# Patient Record
Sex: Male | Born: 1950 | Race: White | Hispanic: No | Marital: Married | Smoking: Former smoker
Health system: Southern US, Community
[De-identification: ages and names within clinical notes are randomized; demographics above are authoritative.]

## PROBLEM LIST (undated history)

## (undated) DIAGNOSIS — J449 Chronic obstructive pulmonary disease, unspecified: Secondary | ICD-10-CM

## (undated) DIAGNOSIS — J439 Emphysema, unspecified: Secondary | ICD-10-CM

## (undated) DIAGNOSIS — I4891 Unspecified atrial fibrillation: Secondary | ICD-10-CM

## (undated) DIAGNOSIS — K219 Gastro-esophageal reflux disease without esophagitis: Secondary | ICD-10-CM

## (undated) DIAGNOSIS — K529 Noninfective gastroenteritis and colitis, unspecified: Secondary | ICD-10-CM

## (undated) DIAGNOSIS — I10 Essential (primary) hypertension: Secondary | ICD-10-CM

## (undated) DIAGNOSIS — I2510 Atherosclerotic heart disease of native coronary artery without angina pectoris: Secondary | ICD-10-CM

## (undated) DIAGNOSIS — I219 Acute myocardial infarction, unspecified: Secondary | ICD-10-CM

## (undated) DIAGNOSIS — E119 Type 2 diabetes mellitus without complications: Secondary | ICD-10-CM

## (undated) HISTORY — PX: ABDOMINAL HERNIA REPAIR: SHX539

## (undated) HISTORY — PX: KNEE ARTHROSCOPY: SHX127

## (undated) HISTORY — PX: CHOLECYSTECTOMY: SHX55

---
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
## 1997-12-18 DIAGNOSIS — I219 Acute myocardial infarction, unspecified: Secondary | ICD-10-CM

## 1997-12-18 HISTORY — DX: Acute myocardial infarction, unspecified: I21.9

## 2000-12-18 DIAGNOSIS — G43909 Migraine, unspecified, not intractable, without status migrainosus: Secondary | ICD-10-CM | POA: Insufficient documentation

## 2001-12-18 DIAGNOSIS — D126 Benign neoplasm of colon, unspecified: Secondary | ICD-10-CM | POA: Insufficient documentation

## 2004-05-06 ENCOUNTER — Other Ambulatory Visit: Payer: Self-pay

## 2004-11-15 ENCOUNTER — Ambulatory Visit: Payer: Self-pay | Admitting: Family Medicine

## 2004-11-23 ENCOUNTER — Ambulatory Visit: Payer: Self-pay

## 2005-07-25 ENCOUNTER — Inpatient Hospital Stay: Payer: Self-pay | Admitting: Internal Medicine

## 2005-07-25 ENCOUNTER — Other Ambulatory Visit: Payer: Self-pay

## 2006-10-18 ENCOUNTER — Ambulatory Visit: Payer: Self-pay | Admitting: Family Medicine

## 2007-06-17 ENCOUNTER — Other Ambulatory Visit: Payer: Self-pay

## 2007-06-17 ENCOUNTER — Emergency Department: Payer: Self-pay | Admitting: Unknown Physician Specialty

## 2007-10-28 ENCOUNTER — Ambulatory Visit: Payer: Self-pay | Admitting: Cardiology

## 2007-10-28 ENCOUNTER — Other Ambulatory Visit: Payer: Self-pay

## 2008-06-25 ENCOUNTER — Ambulatory Visit: Payer: Self-pay | Admitting: Surgery

## 2008-07-22 ENCOUNTER — Ambulatory Visit: Payer: Self-pay | Admitting: Surgery

## 2008-07-29 ENCOUNTER — Ambulatory Visit: Payer: Self-pay | Admitting: Surgery

## 2009-01-28 ENCOUNTER — Observation Stay: Payer: Self-pay | Admitting: Internal Medicine

## 2009-05-07 ENCOUNTER — Ambulatory Visit: Payer: Self-pay | Admitting: Neurology

## 2009-06-12 ENCOUNTER — Emergency Department: Payer: Self-pay | Admitting: Emergency Medicine

## 2009-11-29 ENCOUNTER — Emergency Department: Payer: Self-pay | Admitting: Emergency Medicine

## 2010-02-04 ENCOUNTER — Emergency Department: Payer: Self-pay | Admitting: Unknown Physician Specialty

## 2010-05-14 ENCOUNTER — Inpatient Hospital Stay: Payer: Self-pay | Admitting: Internal Medicine

## 2010-07-08 ENCOUNTER — Emergency Department: Payer: Self-pay | Admitting: Emergency Medicine

## 2012-02-09 ENCOUNTER — Emergency Department: Payer: Self-pay | Admitting: Emergency Medicine

## 2012-02-09 LAB — COMPREHENSIVE METABOLIC PANEL
Albumin: 3.8 g/dL (ref 3.4–5.0)
Anion Gap: 12 (ref 7–16)
BUN: 10 mg/dL (ref 7–18)
Calcium, Total: 8.9 mg/dL (ref 8.5–10.1)
Chloride: 102 mmol/L (ref 98–107)
Co2: 27 mmol/L (ref 21–32)
Creatinine: 1.05 mg/dL (ref 0.60–1.30)
EGFR (African American): >60
EGFR (Non-African Amer.): >60
Glucose: 103 mg/dL — ABNORMAL HIGH (ref 65–99)
Osmolality: 281 (ref 275–301)
Potassium: 3.6 mmol/L (ref 3.5–5.1)
SGOT(AST): 24 U/L (ref 15–37)
SGPT (ALT): 33 U/L
Sodium: 141 mmol/L (ref 136–145)
Total Protein: 7.2 g/dL (ref 6.4–8.2)

## 2012-02-09 LAB — CBC
HCT: 45.8 % (ref 40.0–52.0)
HGB: 15.2 g/dL (ref 13.0–18.0)
MCH: 30 pg (ref 26.0–34.0)
MCV: 91 fL (ref 80–100)
RBC: 5.06 10*6/uL (ref 4.40–5.90)
WBC: 7.4 10*3/uL (ref 3.8–10.6)

## 2012-02-09 LAB — CK TOTAL AND CKMB (NOT AT ARMC)
CK, Total: 90 U/L (ref 35–232)
CK-MB: 1.1 ng/mL (ref 0.5–3.6)

## 2012-02-09 LAB — PRO B NATRIURETIC PEPTIDE: B-Type Natriuretic Peptide: 212 pg/mL — ABNORMAL HIGH (ref 0–125)

## 2012-02-09 LAB — TROPONIN I: Troponin-I: <0.02 ng/mL

## 2012-02-15 ENCOUNTER — Other Ambulatory Visit: Payer: Self-pay | Admitting: Cardiology

## 2012-02-15 LAB — TROPONIN I: Troponin-I: <0.02 ng/mL

## 2012-02-15 LAB — CK TOTAL AND CKMB (NOT AT ARMC)
CK, Total: 70 U/L (ref 35–232)
CK-MB: 1.9 ng/mL (ref 0.5–3.6)

## 2013-02-04 ENCOUNTER — Observation Stay: Payer: Self-pay | Admitting: Internal Medicine

## 2013-02-04 LAB — COMPREHENSIVE METABOLIC PANEL
Albumin: 3.9 g/dL (ref 3.4–5.0)
Alkaline Phosphatase: 130 U/L (ref 50–136)
Anion Gap: 6 — ABNORMAL LOW (ref 7–16)
Bilirubin,Total: 0.5 mg/dL (ref 0.2–1.0)
Chloride: 104 mmol/L (ref 98–107)
Co2: 28 mmol/L (ref 21–32)
Creatinine: 1.02 mg/dL (ref 0.60–1.30)
EGFR (African American): >60
EGFR (Non-African Amer.): >60
Glucose: 114 mg/dL — ABNORMAL HIGH (ref 65–99)
Osmolality: 275 (ref 275–301)
Potassium: 4 mmol/L (ref 3.5–5.1)
SGOT(AST): 32 U/L (ref 15–37)
Sodium: 138 mmol/L (ref 136–145)

## 2013-02-04 LAB — LIPID PANEL
HDL Cholesterol: 38 mg/dL — ABNORMAL LOW (ref 40–60)
Ldl Cholesterol, Calc: 114 mg/dL — ABNORMAL HIGH (ref 0–100)
Triglycerides: 166 mg/dL (ref 0–200)
VLDL Cholesterol, Calc: 33 mg/dL (ref 5–40)

## 2013-02-04 LAB — URINALYSIS, COMPLETE
Bilirubin,UR: NEGATIVE
Glucose,UR: NEGATIVE mg/dL (ref 0–75)
Ketone: NEGATIVE
Nitrite: NEGATIVE
Ph: 6 (ref 4.5–8.0)
Protein: NEGATIVE
RBC,UR: NONE SEEN /HPF (ref 0–5)
Specific Gravity: 1.004 (ref 1.003–1.030)
Squamous Epithelial: NONE SEEN

## 2013-02-04 LAB — CBC WITH DIFFERENTIAL/PLATELET
Basophil #: 0.1 10*3/uL (ref 0.0–0.1)
HCT: 48 % (ref 40.0–52.0)
HGB: 16 g/dL (ref 13.0–18.0)
Lymphocyte #: 1.4 10*3/uL (ref 1.0–3.6)
Lymphocyte %: 14.7 %
MCHC: 33.3 g/dL (ref 32.0–36.0)
Neutrophil #: 6.9 10*3/uL — ABNORMAL HIGH (ref 1.4–6.5)
Platelet: 228 10*3/uL (ref 150–440)
RDW: 16 % — ABNORMAL HIGH (ref 11.5–14.5)
WBC: 9.2 10*3/uL (ref 3.8–10.6)

## 2013-02-04 LAB — CK TOTAL AND CKMB (NOT AT ARMC): CK, Total: 125 U/L (ref 35–232)

## 2013-02-05 LAB — CK TOTAL AND CKMB (NOT AT ARMC)
CK, Total: 74 U/L (ref 35–232)
CK-MB: 0.9 ng/mL (ref 0.5–3.6)

## 2015-02-16 ENCOUNTER — Encounter: Admit: 2015-02-16 | Disposition: A | Discharge status: Home / Self Care | Payer: Self-pay

## 2015-03-19 ENCOUNTER — Encounter: Admit: 2015-03-19 | Disposition: A | Discharge status: Home / Self Care | Payer: Self-pay

## 2015-04-09 NOTE — Discharge Summary (Signed)
PATIENT NAME:  FERN, ASMAR MR#:  [id number] DATE OF BIRTH:  1951/08/24  DATE OF ADMISSION:  02/04/2013 DATE OF DISCHARGE:  02/05/2013  DISCHARGE DIAGNOSES:  1.  Chest pain secondary to known coronary artery disease.  2.  Acute bronchitis.  3.  Chronic obstructive pulmonary disease.  4.  Hypertension.   DISCHARGE MEDICATIONS: 1.  Combivent 2 puffs 4 times daily.  2.  Symbicort 160/4.5 two puffs twice daily.   3.  Clobetasol propionate apply as needed for psoriasis.  4.  Simethicone 80 mg by mouth 3 times daily.  5.  Omeprazole 20 mg by mouth twice daily.  6.  Aspirin 325 mg 1/2 tablet daily.  7.  Imdur 30 mg by mouth daily.  8.  Metoprolol 50 mg by mouth every 12 hours.   DIET:  Low sodium diet.   FOLLOW-UP:  With the [facility] primary physician in 1 to 2 weeks and also Dr. Saralyn Pilar in 1 to 2 weeks.    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  Dr. Saralyn Pilar called me and said his stress test looks okay and he can follow up with them as an outpatient.  The patient admitted yesterday for chest pain and trouble breathing and diaphoresis.  Symptoms relieved with nitroglycerin paste and placed on observation.  Troponins have been negative x 3.  The patient had a stress test.  Results are discussed as below. Dr. Georgeanna Lea  called and said patient does have defects consistent with old MI, but nothing to suggest that he needs further cardiac work-up,  he is sent home with aspirin, beta-blockers and statins that he can continue.  2.  COPD.  He had no flare.  He could continue Symbicort and Combivent.  The patient had a CT of the chest on admission which did not show any pulmonary emboli and the patient's lipid panel showed  LDL of 114 and VLDL 33, HDL 38 and triglycerides 166, so we are going to call in a prescription for Lipitor 10 mg daily to his pharmacy.   TIME SPENT:  About more than 30 minutes on discharge preparation.     ____________________________ Epifanio Lesches, MD sk:ea D: 02/05/2013 22:26:41 ET T: 02/06/2013 06:13:53 ET JOB#: [id number]  cc: Epifanio Lesches, MD, <Dictator> Dr. Stana Bunting MD ELECTRONICALLY SIGNED 02/18/2013 13:44

## 2015-04-09 NOTE — H&P (Signed)
DATE OF BIRTH:  10/02/1951  DATE OF ADMISSION:  02/04/2013  PRIMARY DOCTOR:  Dr. Lovie Macadamia   EMERGENCY ROOM PHYSICIAN:  Dr. Cinda Quest    CHIEF COMPLAINT:  Chest pain.   HISTORY OF PRESENT ILLNESS:  The patient is a 64 year old male patient with history of COPD, who came in because of chest pain. The patient started to have chest pain since yesterday night, but got worse this morning. Says that heaviness in the chest on the left side associated with some nausea, vomiting, diaphoresis. Pain radiates to the left arm as well. The patient says chest pain relieved with nitroglycerin, and patient noticed to have some sweating and dizziness. Right now, he is chest pain free.   PAST MEDICAL HISTORY:  Significant for history of COPD, hyperlipidemia, hypertension, history of hospitalization in 2011. He had a cardiac cath at that time, which showed 100% stenosis in the mid RCA and he wanted medical management.   ALLERGIES:  CODEINE, ERYTHROMYCIN, HYDROCODONE, PREDNISONE, VICODIN,  ZANTAC.  HOME MEDICATIONS:  Albuterol with ipratropium 2 puffs 4 times daily, aspirin 325 mg daily with 1/2 tablet once a day, clobetasol propionate 0.05% topical daily, hydrocortisone topical 2.5% cream to affected areas to face and groin as needed, Nexium 30 mg p.o. daily, metoprolol 100 mg p.o. b.i.d., omeprazole 20 mg p.o. b.i.d., simethicone 80 mg p.o. t.i.d. as needed for gas, Symbicort 160/4.5  inhalation 2 times daily.   FAMILY HISTORY:  Father died at age of 59 with heart attack, and brother died at the age of around 11s with a heart attack.   SOCIAL HISTORY:  He was a heavy smoker, quit 16 years ago. No alcohol. No drugs.   PAST SURGICAL HISTORY:  None.    REVIEW OF SYSTEMS:  GENERAL:  No fever. No fatigue.  EYES:  No blurred vision.  EARS, NOSE, THROAT:  No tinnitus. No ear pain. No epistaxis. No difficulty swallowing. RESPIRATORY:  Has cough and COPD.   CARDIOVASCULAR: Chest pain, with some dizziness, nausea  and diaphoresis, started yesterday, it is relieved with nitroglycerin.  GASTROINTESTINAL:  No nausea. No vomiting. No abdominal pain.  ENDOCRINE:  No polyuria or nocturia. INTEGUMENT:  No skin rash.  MUSCULOSKELETAL: No joint pain.  NEUROLOGIC: No numbness or weakness.  PSYCHIATRIC: No anxiety or insomnia.   PHYSICAL EXAMINATION:  VITAL SIGNS: Temperature normal, heart rate 68, respirations 22, blood pressure 172/86,  right now he is saturating around 92% on room air. GENERAL:  He is alert, awake, oriented, answering questions appropriately.  HEENT: Head atraumatic, normocephalic. Pupils equally reacting to light. Extraocular movements are intact.  EARS, NOSE, THROAT:  No tympanic membrane congestion. No turbinate hypertrophy. No oropharyngeal erythema.  NECK:  Normal range of motion. No JVD.  No carotid bruit.   CARDIOVASCULAR:  S1, S2 regular. No murmurs.  LUNGS:  Clear to auscultation. No wheezing.  ABDOMEN:  Soft, nontender, nondistended. Bowel sounds present.  EXTREMITIES:  No extremity edema. No cyanosis. No clubbing.  NEUROLOGIC:  Alert, awake, oriented. Cranial nerves II to XII are intact. Power 5/5  upper and lower extremities. Sensory and motor intact.  DTRs 2+ bilaterally.  PSYCHIATRIC:  Oriented to time, place, person.   LAB DATA:  Electrolytes:  Sodium 138, potassium 4, chloride 104, bicarb 28, BUN 8, creatinine 1.02, glucose 114. Troponin less than 0.02. CK total to 125, CPK-MB 1.4. WBC 9.2, hemoglobin 6, hematocrit 48, platelets 228. The patient had a CT of the chest, which showed no evidence of pulmonary emboli. No evidence  of pneumonia. Emphysematous changes present in lungs. There is a 3 mm diameter left lower lobe nodule, not clearly evident on the previous study. He needs surveillance CAT scan in 3 to 4 month interval for a period of 2 years.  No evidence of CHF.  No lymphadenopathy. Chest x-ray did not show any acute changes, chronic emphysematous changes present. The  patient's EKG has T-wave inversions in V2, V3, V4, V5 and V6.  Compared to last EKG that was done in February of last year, T-wave inversions, V2 and V3 are new, but patient did have V4, V5, V6 T-wave inversions before.  ASSESSMENT AND PLAN:  1.  The patient is a 64 year old male with history of chest pain before and also cardiac catheterization in 2011 showing 100% stenosis of the first obtuse  marginal, came in with chest pain associated with some nausea, diaphoresis, and relieved with nitroglycerin. Symptoms concerning for unstable angina. The patient will be admitted for overnight observation. Continue aspirin, beta blocker, statins, nitroglycerin. Also cycle the troponins, obtain a stress test in the morning.   2.  Was slightly bradycardic, so will decrease the metoprolol. He takes metoprolol 100 p.o. b.i.d. Will change it to 50 p.o. b.i.d., and monitor on telemetry.   3. Chronic obstructive pulmonary disease. The patient has no flare at this time. No wheezing. Continue Symbicort and albuterol.  Time spent on history and physical: About 55 minutes.   Discussed the plan with the patient, agreeable to stay overnight.    ____________________________ Epifanio Lesches, MD sk:mr D: 02/04/2013 19:01:46 ET T: 02/04/2013 20:19:27 ET JOB#: [id number]  cc: Epifanio Lesches, MD, <Dictator> Youlanda Roys. Lovie Macadamia, MD   Epifanio Lesches MD ELECTRONICALLY SIGNED 02/23/2013 22:34

## 2015-04-19 ENCOUNTER — Encounter: Payer: Non-veteran care | Attending: General Practice

## 2015-04-19 DIAGNOSIS — R0602 Shortness of breath: Secondary | ICD-10-CM | POA: Insufficient documentation

## 2015-04-21 ENCOUNTER — Encounter: Payer: Non-veteran care | Admitting: *Deleted

## 2015-04-21 DIAGNOSIS — R0602 Shortness of breath: Secondary | ICD-10-CM | POA: Diagnosis not present

## 2015-04-21 DIAGNOSIS — J449 Chronic obstructive pulmonary disease, unspecified: Secondary | ICD-10-CM

## 2015-04-21 NOTE — Progress Notes (Signed)
Daily Session Note  Patient Details  Name: STEED KANAAN MRN: [id number] Date of Birth: August 31, 1951 [facility]  Encounter Date: 04/21/2015  Check In:     Session Check In - 04/21/15 1129    Check-In   Staff Present Carson Myrtle BS,RRT, Respiratory Therapist; Lestine Box BS, ACSM EP-C, Exercise Physiologist; Candiss Norse MS, ACSM CEP, Exercise Physiologist   ER physicians immediately available to respond to emergencies [name] immediately available ER MD   Physician(s) Drs: Katrine Coho, and [name] Balboa.   Warm-up and Cool-down Performed on first and last piece of equipment   VAD Patient? No   Pain Assessment   Currently in Pain? No/denies   Multiple Pain Sites No         Goals Met:  Proper associated with RPD/PD & O2 Sat Independence with exercise equipment Exercise tolerated well  Goals Unmet:  Not Applicable  Goals Comments:    Dr. Emily Filbert is Medical Director for [place] and [name].

## 2015-04-23 ENCOUNTER — Encounter: Payer: Non-veteran care | Admitting: Respiratory Therapy

## 2015-04-23 DIAGNOSIS — J449 Chronic obstructive pulmonary disease, unspecified: Secondary | ICD-10-CM

## 2015-04-23 DIAGNOSIS — R0602 Shortness of breath: Secondary | ICD-10-CM | POA: Diagnosis not present

## 2015-04-23 NOTE — Progress Notes (Signed)
Daily Session Note  Patient Details  Name: Darius Norris MRN: [id number] Date of Birth: 1951/11/20 Referring Provider:  [facility]  Encounter Date: 04/23/2015  Check In:     Session Check In - 04/23/15 1050    Check-In   Staff Present Heath Lark RN, BSN, CCRP;Carroll Enterkin RN, BSN;Ellissa Ayo Blanch Media RRT, RCP Respiratory Therapist;Renee Dillard Essex MS, ACSM CEP Exercise Physiologist   ER physicians immediately available to respond to emergencies [name] immediately available ER MD   Physician(s) Dr. Karma Greaser and Dr. Reita Cliche   Warm-up and Cool-down Performed on first and last piece of equipment   VAD Patient? No   Pain Assessment   Currently in Pain? No/denies           Exercise Prescription Changes - 04/23/15 1000    Recumbant Elliptical   Watts 60      Goals Met:  Proper associated with RPD/PD & O2 Sat Independence with exercise equipment Exercise tolerated well Personal goals reviewed  Goals Unmet:  Not Applicable  Goals Comments:    Dr. Emily Filbert is Medical Director for [place] and [name].

## 2015-04-23 NOTE — Progress Notes (Signed)
Daily Session Note  Patient Details  Name: Darius Norris MRN: [id number] Date of Birth: 1951/10/16 Referring Provider:  [facility]  Encounter Date: 04/23/2015  Check In:     Session Check In - 04/23/15 1050    Check-In   Staff Present Heath Lark RN, BSN, CCRP;Carroll Enterkin RN, BSN;Jr Milliron Blanch Media RRT, RCP Respiratory Therapist;Renee Dillard Essex MS, ACSM CEP Exercise Physiologist   ER physicians immediately available to respond to emergencies [name] immediately available ER MD   Physician(s) Dr. Karma Greaser and Dr. Reita Cliche   Warm-up and Cool-down Performed on first and last piece of equipment   VAD Patient? No   Pain Assessment   Currently in Pain? No/denies           Exercise Prescription Changes - 04/23/15 1000    Recumbant Elliptical   Watts --      Goals Met:  Proper associated with RPD/PD & O2 Sat Independence with exercise equipment Exercise tolerated well  Goals Unmet:  Not Applicable  Goals Comments:    Dr. Emily Filbert is Medical Director for [place] and [name].

## 2015-04-26 ENCOUNTER — Encounter: Payer: Non-veteran care | Admitting: *Deleted

## 2015-04-26 DIAGNOSIS — J449 Chronic obstructive pulmonary disease, unspecified: Secondary | ICD-10-CM

## 2015-04-26 DIAGNOSIS — R0602 Shortness of breath: Secondary | ICD-10-CM | POA: Diagnosis not present

## 2015-04-26 NOTE — Progress Notes (Signed)
Daily Session Note  Patient Details  Name: GRIER VU MRN: [id number] Date of Birth: 1951-02-27 [facility]  Encounter Date: 04/26/2015  Check In:     Session Check In - 04/26/15 1123    Check-In   Staff Present Lestine Box BS, ACSM EP-C, Exercise Physiologist;Lacy Sofia Dillard Essex MS, ACSM CEP Exercise Physiologist;Laureen Janell Quiet, RRT, Respiratory Therapist   ER physicians immediately available to respond to emergencies [name] immediately available ER MD   Physician(s) Schaevit and Jimmye Norman   Warm-up and Cool-down Performed on first and last piece of equipment   VAD Patient? No   Pain Assessment   Currently in Pain? No/denies   Multiple Pain Sites No           Exercise Prescription Changes - 04/26/15 1100    Exercise Review   Progression Yes   NuStep   Level 4   Watts 45   Minutes 15      Goals Met:  Proper associated with RPD/PD & O2 Sat Independence with exercise equipment Exercise tolerated well  Goals Unmet:  Not Applicable  Goals Comments:    Dr. Emily Filbert is Medical Director for [place] and [name].

## 2015-04-28 ENCOUNTER — Encounter: Payer: Non-veteran care | Admitting: *Deleted

## 2015-04-28 DIAGNOSIS — J449 Chronic obstructive pulmonary disease, unspecified: Secondary | ICD-10-CM

## 2015-04-28 DIAGNOSIS — R0602 Shortness of breath: Secondary | ICD-10-CM | POA: Diagnosis not present

## 2015-04-28 NOTE — Progress Notes (Signed)
Daily Session Note  Patient Details  Name: Darius Norris MRN: [id number] Date of Birth: 1951-08-12 [facility]  Encounter Date: 04/28/2015  Check In:     Session Check In - 04/28/15 1142    Check-In   Staff Present Heath Lark RN, BSN, CCRP;Steven Way BS, ACSM EP-C, Exercise Physiologist;Renee Leland MS, ACSM CEP Exercise Physiologist;Laureen [employer], RRT, Respiratory Therapist   ER physicians immediately available to respond to emergencies [name] immediately available ER MD   Physician(s) Drs: Thomasene Lot and Joni Fears   Medication changes reported     No   Fall or balance concerns reported    No   Warm-up and Cool-down Performed on first and last piece of equipment   VAD Patient? No   Pain Assessment   Currently in Pain? No/denies         Goals Met:  Independence with exercise equipment Exercise tolerated well Strength training completed today  Goals Unmet:  Not Applicable  Goals Comments:    Dr. Emily Filbert is Medical Director for [place] and [name].

## 2015-04-30 ENCOUNTER — Encounter: Payer: Non-veteran care | Admitting: *Deleted

## 2015-04-30 DIAGNOSIS — J449 Chronic obstructive pulmonary disease, unspecified: Secondary | ICD-10-CM

## 2015-04-30 DIAGNOSIS — R0602 Shortness of breath: Secondary | ICD-10-CM | POA: Diagnosis not present

## 2015-04-30 NOTE — Progress Notes (Signed)
Daily Session Note  Patient Details  Name: Darius Norris MRN: [id number] Date of Birth: 1951-04-06 Referring Provider:  [facility]  Encounter Date: 04/30/2015  Check In:     Session Check In - 04/30/15 1051    Check-In   Staff Present Heath Lark RN, BSN, CCRP;Braelynn Lupton Bowles MS, ACSM CEP Exercise Physiologist;Stacey Blanch Media RRT, RCP Respiratory Therapist   ER physicians immediately available to respond to emergencies [name] immediately available ER MD   Physician(s) Kerman Passey and Quale   Medication changes reported     No   Fall or balance concerns reported    No   Warm-up and Cool-down Performed on first and last piece of equipment   VAD Patient? No   Pain Assessment   Currently in Pain? No/denies   Multiple Pain Sites No         Goals Met:  Proper associated with RPD/PD & O2 Sat Independence with exercise equipment Using PLB without cueing & demonstrates good technique Exercise tolerated well Strength training completed today  Goals Unmet:  Not Applicable  Goals Comments:   Dr. Emily Filbert is Medical Director for [place] and [name].

## 2015-05-03 ENCOUNTER — Encounter: Payer: Non-veteran care | Admitting: *Deleted

## 2015-05-03 DIAGNOSIS — R0602 Shortness of breath: Secondary | ICD-10-CM | POA: Diagnosis not present

## 2015-05-03 DIAGNOSIS — J449 Chronic obstructive pulmonary disease, unspecified: Secondary | ICD-10-CM

## 2015-05-03 NOTE — Progress Notes (Signed)
Daily Session Note  Patient Details  Name: MARUICE PIERONI MRN: [id number] Date of Birth: [birth year] [facility]  Encounter Date: 05/03/2015  Check In:     Session Check In - 05/03/15 1110    Check-In   Staff Present Laureen Owens Shark BS, RRT, Respiratory Therapist;Steven Way BS, ACSM EP-C, Exercise Physiologist;Renee Dillard Essex MS, ACSM CEP Exercise Physiologist   ER physicians immediately available to respond to emergencies [name] immediately available ER MD   Physician(s) Karma Greaser and Corky Downs   Medication changes reported     No   Fall or balance concerns reported    No   Warm-up and Cool-down Performed on first and last piece of equipment   VAD Patient? No   Pain Assessment   Currently in Pain? No/denies           Exercise Prescription Changes - 05/03/15 1100    Exercise Review   Progression Yes   REL-XR   Level 5   Watts 50      Goals Met:  Proper associated with RPD/PD & O2 Sat Independence with exercise equipment Exercise tolerated well Personal goals reviewed Strength training completed today  Goals Unmet:  Not Applicable  Goals Comments:    Dr. Emily Filbert is Medical Director for [place] and [name].

## 2015-05-05 DIAGNOSIS — J449 Chronic obstructive pulmonary disease, unspecified: Secondary | ICD-10-CM

## 2015-05-05 DIAGNOSIS — R0602 Shortness of breath: Secondary | ICD-10-CM | POA: Diagnosis not present

## 2015-05-05 NOTE — Progress Notes (Signed)
Daily Session Note  Patient Details  Name: DAN SCEARCE MRN: [id number] Date of Birth: 1951/09/06 [facility]  Encounter Date: 05/05/2015  Check In:     Session Check In - 05/05/15 1043    Check-In   Staff Present Laureen Owens Shark BS, RRT, Respiratory Therapist;Renee Dillard Essex MS, ACSM CEP Exercise Physiologist;Emon Lance BS, ACSM EP-C, Exercise Physiologist   ER physicians immediately available to respond to emergencies See telemetry face sheet for immediately available ER MD   Medication changes reported     No   Fall or balance concerns reported    No   Warm-up and Cool-down Performed on first and last piece of equipment   Pain Assessment   Currently in Pain? No/denies         Goals Met:  Proper associated with RPD/PD & O2 Sat Exercise tolerated well No report of cardiac concerns or symptoms Strength training completed today  Goals Unmet:  Not Applicable  Goals Comments:    Dr. Emily Filbert is Medical Director for [place] and [name].

## 2015-05-07 ENCOUNTER — Encounter: Payer: Non-veteran care | Admitting: *Deleted

## 2015-05-07 DIAGNOSIS — R0602 Shortness of breath: Secondary | ICD-10-CM | POA: Diagnosis not present

## 2015-05-07 DIAGNOSIS — J449 Chronic obstructive pulmonary disease, unspecified: Secondary | ICD-10-CM

## 2015-05-07 NOTE — Progress Notes (Signed)
Daily Session Note  Patient Details  Name: Darius Norris MRN: [id number] Date of Birth: February 19, 1951 Referring Provider:  [facility]  Encounter Date: 05/07/2015  Check In:     Session Check In - 05/07/15 1030    Check-In   Staff Present Heath Lark RN, BSN, CCRP;[place] Welcome Mahaffey MS, ACSM CEP Exercise Physiologist;Stacey Blanch Media RRT, RCP Respiratory Therapist   ER physicians immediately available to respond to emergencies [name] immediately available ER MD   Physician(s) Reita Cliche and Corky Downs   Medication changes reported     No   Fall or balance concerns reported    No   Warm-up and Cool-down Performed on first and last piece of equipment   VAD Patient? No   Pain Assessment   Currently in Pain? No/denies   Multiple Pain Sites No         Goals Met:  Proper associated with RPD/PD & O2 Sat Independence with exercise equipment Using PLB without cueing & demonstrates good technique Exercise tolerated well Strength training completed today  Goals Unmet:  Not Applicable  Goals Comments:   Dr. Emily Filbert is Medical Director for [place] and [name].

## 2015-05-10 ENCOUNTER — Encounter: Payer: Non-veteran care | Admitting: *Deleted

## 2015-05-10 DIAGNOSIS — J449 Chronic obstructive pulmonary disease, unspecified: Secondary | ICD-10-CM

## 2015-05-10 DIAGNOSIS — R0602 Shortness of breath: Secondary | ICD-10-CM | POA: Diagnosis not present

## 2015-05-10 NOTE — Progress Notes (Signed)
Daily Session Note  Patient Details  Name: Darius Norris MRN: [id number] Date of Birth: [birth year] [facility]  Encounter Date: 05/10/2015  Check In:     Session Check In - 05/10/15 1312    Check-In   Staff Present Laureen Owens Shark BS, RRT, Respiratory Therapist;Kelly Alfonso Patten, ACSM CEP Exercise Physiologist;Jamie Athas MPH, CHES;Renee Dillard Essex MS, ACSM CEP Exercise Physiologist   ER physicians immediately available to respond to emergencies [name] immediately available ER MD   Physician(s) Corky Downs and Lord   Medication changes reported     No   Fall or balance concerns reported    No   Warm-up and Cool-down Performed on first and last piece of equipment   VAD Patient? No   Pain Assessment   Currently in Pain? No/denies   Multiple Pain Sites No           Exercise Prescription Changes - 05/10/15 1300    Exercise Review   Progression Yes   Response to Exercise   Blood Pressure (Admit) 120/70 mmHg   Blood Pressure (Exercise) 150/78 mmHg   Blood Pressure (Exit) 110/64 mmHg   Heart Rate (Admit) 82 bpm   Heart Rate (Exercise) 90 bpm   Heart Rate (Exit) 64 bpm   Oxygen Saturation (Admit) 90 %   Oxygen Saturation (Exercise) 96 %   Oxygen Saturation (Exit) 97 %   Rating of Perceived Exertion (Exercise) 12   Perceived Dyspnea (Exercise) 3   Resistance Training   Training Prescription Yes   Weight 3   Reps 10-12   Treadmill   MPH 1.4   Grade 0   Minutes 15   NuStep   Level 4   Watts 65   Minutes 15   REL-XR   Level 5   Watts 50   Minutes 15      Goals Met:  Proper associated with RPD/PD & O2 Sat Independence with exercise equipment Exercise tolerated well Queuing for purse lip breathing Strength training completed today  Goals Unmet:  Not Applicable  Goals Comments: Darius Norris plans to continue exercise at the [facility] center or is considering Financial [employer].   Dr. Emily Filbert is Medical Director for [place] and [name].

## 2015-05-10 NOTE — Progress Notes (Signed)
Daily Session Note  Patient Details  Name: Darius Norris MRN: [id number] Date of Birth: 11/13/1951 [facility]  Encounter Date: 05/10/2015  Check In:     Session Check In - 05/10/15 1312    Check-In   Staff Present Laureen Owens Shark BS, RRT, Respiratory Therapist;Sally-Ann Cutbirth Alfonso Patten, ACSM CEP Exercise Physiologist;Jamie Athas MPH, CHES;Renee Dillard Essex MS, ACSM CEP Exercise Physiologist   ER physicians immediately available to respond to emergencies [name] immediately available ER MD   Physician(s) Corky Downs and Lord   Medication changes reported     No   Fall or balance concerns reported    No   Warm-up and Cool-down Performed on first and last piece of equipment   VAD Patient? No   Pain Assessment   Currently in Pain? No/denies   Multiple Pain Sites No           Exercise Prescription Changes - 05/10/15 1300    Exercise Review   Progression Yes   Response to Exercise   Blood Pressure (Admit) 120/70 mmHg   Blood Pressure (Exercise) 150/78 mmHg   Blood Pressure (Exit) 110/64 mmHg   Heart Rate (Admit) 82 bpm   Heart Rate (Exercise) 90 bpm   Heart Rate (Exit) 64 bpm   Oxygen Saturation (Admit) 90 %   Oxygen Saturation (Exercise) 96 %   Oxygen Saturation (Exit) 97 %   Rating of Perceived Exertion (Exercise) 12   Perceived Dyspnea (Exercise) 3   Resistance Training   Training Prescription Yes   Weight 3   Reps 10-12   Treadmill   MPH 1.4   Grade 0   Minutes 15   NuStep   Level 4   Watts 65   Minutes 15   REL-XR   Level 5   Watts 50   Minutes 15      Goals Met:  Proper associated with RPD/PD & O2 Sat Independence with exercise equipment Exercise tolerated well Strength training completed today  Goals Unmet:  Not Applicable  Goals Comments:    Dr. Emily Filbert is Medical Director for [place] and [name].

## 2015-05-10 NOTE — Progress Notes (Signed)
Pulmonary Individual Treatment Plan  Patient Details  Name: Darius Norris MRN: [id number] Date of Birth: [birth year] Referring Provider:  [facility]  Initial Encounter Date:    Visit Diagnosis: Chronic obstructive pulmonary disease, unspecified COPD, unspecified chronic bronchitis type  Patient's Home Medications on Admission: No current outpatient prescriptions on file.  Past Medical History: No past medical history on file.  Tobacco Use: History  Smoking status  . Not on file  Smokeless tobacco  . Not on file    Labs: Recent Review Flowsheet Data    There is no flowsheet data to display.       ADL UCSD:    Pulmonary Function Assessment:   Exercise Target Goals:    Exercise Program Goal: Individual exercise prescription set with THRR, safety & activity barriers. Participant demonstrates ability to understand and report RPE using BORG scale, to self-measure pulse accurately, and to acknowledge the importance of the exercise prescription.  Exercise Prescription Goal: Starting with aerobic activity 30 plus minutes a day, 3 days per week for initial exercise prescription. Provide home exercise prescription and guidelines that participant acknowledges understanding prior to discharge.  Activity Barriers & Risk Stratification:   6 Minute Walk:   Initial Exercise Prescription:   Exercise Prescription Changes:     Exercise Prescription Changes      04/23/15 1000 04/26/15 1100 05/03/15 1100 05/05/15 1000 05/10/15 1300   Exercise Review   Progression  Yes Yes Yes Yes   Response to Exercise   Blood Pressure (Admit)     120/70 mmHg   Blood Pressure (Exercise)     150/78 mmHg   Blood Pressure (Exit)     110/64 mmHg   Heart Rate (Admit)     82 bpm   Heart Rate (Exercise)     90 bpm   Heart Rate (Exit)     64 bpm   Oxygen Saturation (Admit)     90 %   Oxygen Saturation (Exercise)     96 %   Oxygen Saturation (Exit)     97 %   Rating of Perceived  Exertion (Exercise)     12   Perceived Dyspnea (Exercise)     3   Resistance Training   Training Prescription     Yes   Weight     3   Reps     10-12   Treadmill   MPH     1.4   Grade     0   Minutes     15   NuStep   Level  4   4   Watts  45   65   Minutes  15  15 15    Recumbant Elliptical   Watts --       REL-XR   Level   5  5   Watts   50  50   Minutes     15      Discharge Exercise Prescription:    Nutrition:  Target Goals: Understanding of nutrition guidelines, daily intake of sodium 1500mg , cholesterol 200mg , calories 30% from fat and 7% or less from saturated fats, daily to have 5 or more servings of fruits and vegetables.  Biometrics:    Nutrition Therapy Plan and Nutrition Goals:   Nutrition Discharge: Rate Your Plate Scores:   Psychosocial: Target Goals: Acknowledge presence or absence of depression, maximize coping skills, provide positive support system. Participant is able to verbalize types and ability to use techniques and skills needed for  reducing stress and depression.  Initial Review & Psychosocial Screening:   Quality of Life Scores:   PHQ-9:     Recent Review Flowsheet Data    There is no flowsheet data to display.      Psychosocial Evaluation and Intervention:   Psychosocial Re-Evaluation:     Psychosocial Re-Evaluation      04/21/15 1108           Psychosocial Re-Evaluation   Comments Follow up with Darius Norris today reporting progress since coming into this program with less shortness of breath and his ability to do more of his normal activities now.  He has not seen his doctor yet about a sleep study or his panic symptoms since his next scheduled appointment is in July.   He reports ongoing issues with sleep and panic but his sugar levels have regulated better as a result of eating smaller meals more often.  Darius Norris plans to begin working out at the [facility] locally upon completiong of this program in order to  maintain his progress.       Continued Psychosocial Services Needed Yes  Darius Norris will benefit from a sleep study to address his interrupted sleep and also a med eval to address ongoing panic symptoms.         Education: Education Goals: Education classes will be provided on a weekly basis, covering required topics. Participant will state understanding/return demonstration of topics presented.  Learning Barriers/Preferences:   Education Topics: Initial Evaluation Education: - Verbal, written and demonstration of respiratory meds, RPE/PD scales, oximetry and breathing techniques. Instruction on use of nebulizers and MDIs: cleaning and proper use, rinsing mouth with steroid doses and importance of monitoring MDI activations.   General Nutrition Guidelines/Fats and Fiber: -Group instruction provided by verbal, written material, models and posters to present the general guidelines for heart healthy nutrition. Gives an explanation and review of dietary fats and fiber.   Controlling Sodium/Reading Food Labels: -Group verbal and written material supporting the discussion of sodium use in heart healthy nutrition. Review and explanation with models, verbal and written materials for utilization of the food label.   Exercise Physiology & Risk Factors: - Group verbal and written instruction with models to review the exercise physiology of the cardiovascular system and associated critical values. Details cardiovascular disease risk factors and the goals associated with each risk factor.   Aerobic Exercise & Resistance Training: - Gives group verbal and written discussion on the health impact of inactivity. On the components of aerobic and resistive training programs and the benefits of this training and how to safely progress through these programs.   Flexibility, Balance, General Exercise Guidelines: - Provides group verbal and written instruction on the benefits of flexibility and balance training  programs. Provides general exercise guidelines with specific guidelines to those with heart or lung disease. Demonstration and skill practice provided.   Stress Management: - Provides group verbal and written instruction about the health risks of elevated stress, cause of high stress, and healthy ways to reduce stress.   Depression: - Provides group verbal and written instruction on the correlation between heart/lung disease and depressed mood, treatment options, and the stigmas associated with seeking treatment.   Exercise & Equipment Safety: - Individual verbal instruction and demonstration of equipment use and safety with use of the equipment.   Infection Prevention: - Provides verbal and written material to individual with discussion of infection control including proper hand washing and proper equipment cleaning during exercise session.  Falls Prevention: - Provides verbal and written material to individual with discussion of falls prevention and safety.   Diabetes: - Individual verbal and written instruction to review signs/symptoms of diabetes, desired ranges of glucose level fasting, after meals and with exercise. Advice that pre and post exercise glucose checks will be done for 3 sessions at entry of program.   Chronic Lung Diseases: - Group verbal and written instruction to review new updates, new respiratory medications, new advancements in procedures and treatments. Provide informative websites and "800" numbers of self-education.   Lung Procedures: - Group verbal and written instruction to describe testing methods done to diagnose lung disease. Review the outcome of test results. Describe the treatment choices: Pulmonary Function Tests, ABGs and oximetry.   Energy Conservation: - Provide group verbal and written instruction for methods to conserve energy, plan and organize activities. Instruct on pacing techniques, use of adaptive equipment and posture/positioning to  relieve shortness of breath.   Triggers: - Group verbal and written instruction to review types of environmental controls: home humidity, furnaces, filters, dust mite/pet prevention, HEPA vacuums. To discuss weather changes, air quality and the benefits of nasal washing.   Exacerbations: - Group verbal and written instruction to provide: warning signs, infection symptoms, calling MD promptly, preventive modes, and value of vaccinations. Review: effective airway clearance, coughing and/or vibration techniques. Create an Sports administrator.   Oxygen: - Individual and group verbal and written instruction on oxygen therapy. Includes supplement oxygen, available portable oxygen systems, continuous and intermittent flow rates, oxygen safety, concentrators, and Medicare reimbursement for oxygen.   Respiratory Medications: - Group verbal and written instruction to review medications for lung disease. Drug class, frequency, complications, importance of spacers, rinsing mouth after steroid MDI's, and proper cleaning methods for nebulizers.   AED/CPR: - Group verbal and written instruction with the use of models to demonstrate the basic use of the AED with the basic ABC's of resuscitation.   Breathing Retraining: - Provides individuals verbal and written instruction on purpose, frequency, and proper technique of diaphragmatic breathing and pursed-lipped breathing. Applies individual practice skills.   Anatomy and Physiology of the Lungs: - Group verbal and written instruction with the use of models to provide basic lung anatomy and physiology related to function, structure and complications of lung disease.   Heart Failure: - Group verbal and written instruction on the basics of heart failure: signs/symptoms, treatments, explanation of ejection fraction, enlarged heart and cardiomyopathy.   Sleep Apnea: - Individual verbal and written instruction to review Obstructive Sleep Apnea. Review of risk  factors, methods for diagnosing and types of masks and machines for OSA.   Anxiety: - Provides group, verbal and written instruction on the correlation between heart/lung disease and anxiety, treatment options, and management of anxiety.   Relaxation: - Provides group, verbal and written instruction about the benefits of relaxation for patients with heart/lung disease. Also provides patients with examples of relaxation techniques.   Knowledge Questionnaire Score:   Personal Goals and Risk Factors at Admission:   Personal Goals and Risk Factors Review:    Personal Goals Discharge:    Comments: 30 day review

## 2015-05-12 DIAGNOSIS — R0602 Shortness of breath: Secondary | ICD-10-CM | POA: Diagnosis not present

## 2015-05-12 DIAGNOSIS — J449 Chronic obstructive pulmonary disease, unspecified: Secondary | ICD-10-CM

## 2015-05-12 NOTE — Progress Notes (Signed)
Daily Session Note  Patient Details  Name: Darius Norris MRN: [id number] Date of Birth: [birth year] Referring Provider:  [facility]  Encounter Date: 05/12/2015  Check In:     Session Check In - 05/12/15 1041    Check-In   Staff Present Gerlene Burdock RN, Drusilla Kanner MS, ACSM CEP Exercise Physiologist;Jaclynn Laumann BS, ACSM EP-C, Exercise Physiologist   ER physicians immediately available to respond to emergencies [name] immediately available ER MD   Medication changes reported     No   Fall or balance concerns reported    No   Warm-up and Cool-down Performed on first and last piece of equipment   VAD Patient? No   Pain Assessment   Currently in Pain? No/denies         Goals Met:  Proper associated with RPD/PD & O2 Sat Exercise tolerated well No report of cardiac concerns or symptoms Strength training completed today  Goals Unmet:  Not Applicable  Goals Comments:   Dr. Emily Filbert is Medical Director for [place] and [name].

## 2015-05-12 NOTE — Patient Instructions (Signed)
pul Patient Instructions  Patient Details  Name: Darius Norris MRN: [id number] Date of Birth: [birth year] Referring Provider:  [place]  Below are the personal goals you chose as well as exercise and nutrition goals. Our goal is to help you keep on track towards obtaining and maintaining your goals. We will be discussing your progress on these goals with you throughout the program.  Initial Exercise Prescription:   Exercise Goals: Frequency: Be able to perform aerobic exercise three times per week working toward 3-5 days per week.  Intensity: Work with a perceived exertion of 11 (fairly light) - 15 (hard) as tolerated. Follow your new exercise prescription and watch for changes in prescription as you progress with the program. Changes will be reviewed with you when they are made.  Duration: You should be able to do 30 minutes of continuous aerobic exercise in addition to a 5 minute warm-up and a 5 minute cool-down routine.  Nutrition Goals: Your personal nutrition goals will be established when you do your nutrition analysis with the dietician.  The following are nutrition guidelines to follow: Cholesterol < 200mg /day Sodium < 1500mg /day Fiber: Men over 50 yrs - 30 grams per day  Personal Goals:     Personal Goals and Risk Factors at Admission - 11/24/14 0900    Personal Goals and Risk Factors on Admission    Weight Management Yes   Intervention Learn and follow the exercise and diet guidelines while in the program. Utilize the nutrition and education classes to help gain knowledge of the diet and exercise expectations in the program   Admit Weight 285 lb (129.275 kg)   Goal Weight 260 lb (117.935 kg)   Increase Aerobic Exercise and Physical Activity Yes   Intervention While in program, learn and follow the exercise prescription taught. Start at a low level workload and increase workload after able to maintain previous level for 30 minutes. Increase time before  increasing intensity.   Understand more about Heart/Pulmonary Disease. Yes   Intervention While in program utilize professionals for any questions, and attend the education sessions. Great websites to use are www.americanheart.org or www.lung.org for reliable information.   Improve shortness of breath with ADL's Yes   Intervention While in program, learn and follow the exercise prescription taught. Start at a low level workload and increase workload ad advised by the exercise physiologist. Increase time before increasing intensity.   Develop more efficient breathing techniques such as purse lipped breathing and diaphragmatic breathing; and practicing self-pacing with activity Yes   Intervention While in program, learn and utilize the specific breathing techniques taught to you. Continue to practice and use the techniques as needed.   Increase knowledge of respiratory medications and ability to use respiratory devices properly.  Yes   Intervention While in program learn and demonstrate appropriate use of your oxygen therapy by increasing flow with exertion, manage oxygen tank operation, including continuous and intermittent flow.  Understanding oxygen is a drug ordered by your physician.;While in program, learn to administer MDI, nebulizer, and spacer properly.;Learn to take respiratory medicine as ordered.;While in program, learn to Clean MDI, nebulizers, and spacers properly.   Hypertension Yes   Goal Participant will see blood pressure controlled within the values of 140/28mm/Hg or within value directed by their physician.   Intervention Provide nutrition & aerobic exercise along with prescribed medications to achieve BP 140/90 or less.   Lipids Yes   Goal Cholesterol controlled with medications as prescribed, with individualized exercise RX and with  personalized nutrition plan. Value goals: LDL < 70mg , HDL > 40mg . Participant states understanding of desired cholesterol values and following  prescriptions.   Intervention Provide nutrition & aerobic exercise along with prescribed medications to achieve LDL 70mg , HDL >40mg .      Tobacco Use Initial Evaluation: History  Smoking status   Not on file  Smokeless tobacco   Not on file    Copy of goals given to participant.

## 2015-05-14 ENCOUNTER — Telehealth: Payer: Self-pay | Admitting: *Deleted

## 2015-05-14 NOTE — Telephone Encounter (Signed)
NOt able to attend today.

## 2015-05-14 NOTE — Telephone Encounter (Signed)
Wife left vm he would be unable to attend [facility] today.

## 2015-05-19 ENCOUNTER — Encounter: Payer: Medicare Other | Attending: Internal Medicine | Admitting: Respiratory Therapy

## 2015-05-19 VITALS — Ht 66.0 in | Wt 206.0 lb

## 2015-05-19 DIAGNOSIS — J449 Chronic obstructive pulmonary disease, unspecified: Secondary | ICD-10-CM

## 2015-05-19 DIAGNOSIS — R0602 Shortness of breath: Secondary | ICD-10-CM | POA: Insufficient documentation

## 2015-05-19 NOTE — Progress Notes (Signed)
Daily Session Note  Patient Details  Name: Darius Norris MRN: [id number] Date of Birth: April 07, 1951 Referring Provider:  [facility]  Encounter Date: 05/19/2015  Check In:      Goals Met:  Updating Patient Instruction  Goals Unmet:  Not Applicable  Goals Comments:   Dr. Emily Filbert is Medical Director for [place] and [name].

## 2015-05-19 NOTE — Patient Instructions (Signed)
pul Patient Instructions  Patient Details  Name: Darius Norris MRN: [id number] Date of Birth: 08/22/1951 Referring Provider:  [place]  Below are the personal goals you chose as well as exercise and nutrition goals. Our goal is to help you keep on track towards obtaining and maintaining your goals. We will be discussing your progress on these goals with you throughout the program.  Initial Exercise Prescription:   Exercise Goals: Frequency: Be able to perform aerobic exercise three times per week working toward 3-5 days per week.  Intensity: Work with a perceived exertion of 11 (fairly light) - 15 (hard) as tolerated. Follow your new exercise prescription and watch for changes in prescription as you progress with the program. Changes will be reviewed with you when they are made.  Duration: You should be able to do 30 minutes of continuous aerobic exercise in addition to a 5 minute warm-up and a 5 minute cool-down routine.  Nutrition Goals: Your personal nutrition goals will be established when you do your nutrition analysis with the dietician.  The following are nutrition guidelines to follow: Cholesterol < 200mg /day Sodium < 1500mg /day Fiber: Men over 50 yrs - 30 grams per day  Personal Goals:     Personal Goals and Risk Factors at Admission - 11/24/14 0900    Personal Goals and Risk Factors on Admission    Weight Management Yes   Intervention Learn and follow the exercise and diet guidelines while in the program. Utilize the nutrition and education classes to help gain knowledge of the diet and exercise expectations in the program   Admit Weight 285 lb (129.275 kg)   Goal Weight 260 lb (117.935 kg)   Increase Aerobic Exercise and Physical Activity Yes   Intervention While in program, learn and follow the exercise prescription taught. Start at a low level workload and increase workload after able to maintain previous level for 30 minutes. Increase time before  increasing intensity.   Understand more about Heart/Pulmonary Disease. Yes   Intervention While in program utilize professionals for any questions, and attend the education sessions. Great websites to use are www.americanheart.org or www.lung.org for reliable information.   Improve shortness of breath with ADL's Yes   Intervention While in program, learn and follow the exercise prescription taught. Start at a low level workload and increase workload ad advised by the exercise physiologist. Increase time before increasing intensity.   Develop more efficient breathing techniques such as purse lipped breathing and diaphragmatic breathing; and practicing self-pacing with activity Yes   Intervention While in program, learn and utilize the specific breathing techniques taught to you. Continue to practice and use the techniques as needed.   Increase knowledge of respiratory medications and ability to use respiratory devices properly.  Yes   Intervention While in program learn and demonstrate appropriate use of your oxygen therapy by increasing flow with exertion, manage oxygen tank operation, including continuous and intermittent flow.  Understanding oxygen is a drug ordered by your physician.;While in program, learn to administer MDI, nebulizer, and spacer properly.;Learn to take respiratory medicine as ordered.;While in program, learn to Clean MDI, nebulizers, and spacers properly.   Hypertension Yes   Goal Participant will see blood pressure controlled within the values of 140/51mm/Hg or within value directed by their physician.   Intervention Provide nutrition & aerobic exercise along with prescribed medications to achieve BP 140/90 or less.   Lipids Yes   Goal Cholesterol controlled with medications as prescribed, with individualized exercise RX and with  personalized nutrition plan. Value goals: LDL < 70mg , HDL > 40mg . Participant states understanding of desired cholesterol values and following  prescriptions.   Intervention Provide nutrition & aerobic exercise along with prescribed medications to achieve LDL 70mg , HDL >40mg .      Tobacco Use Initial Evaluation: History  Smoking status   Not on file  Smokeless tobacco   Not on file    Copy of goals given to participant.

## 2015-05-19 NOTE — Telephone Encounter (Signed)
Left vm unable to attend today.

## 2015-05-21 ENCOUNTER — Encounter: Payer: Medicare Other | Admitting: *Deleted

## 2015-05-21 DIAGNOSIS — J449 Chronic obstructive pulmonary disease, unspecified: Secondary | ICD-10-CM

## 2015-05-21 DIAGNOSIS — R0602 Shortness of breath: Secondary | ICD-10-CM | POA: Diagnosis not present

## 2015-05-21 NOTE — Progress Notes (Signed)
Daily Session Note  Patient Details  Name: Darius Norris MRN: [id number] Date of Birth: 06/05/1951 Referring Provider:  [facility]  Encounter Date: 05/21/2015  Check In:     Session Check In - 05/21/15 1055    Check-In   Staff Present Laureen Owens Shark BS, RRT, Respiratory Therapist;Joeanthony Seeling RN, BSN;Renee Dillard [place] MS, ACSM CEP Exercise Physiologist;Susanne Bice RN, BSN, CCRP   ER physicians immediately available to respond to emergencies [name] immediately available ER MD   Physician(s) Dr. DR. [name] Balboa, Dr. Reita Cliche   Medication changes reported     No   Fall or balance concerns reported    No   Warm-up and Cool-down Performed on first and last piece of equipment   VAD Patient? No   Pain Assessment   Currently in Pain? No/denies         Goals Met:  Proper associated with RPD/PD & O2 Sat Exercise tolerated well  Goals Unmet:  Not Applicable  Goals Comments:    Dr. Emily Filbert is Medical Director for [place] and [name].

## 2015-05-24 ENCOUNTER — Encounter: Payer: Medicare Other | Admitting: *Deleted

## 2015-05-24 DIAGNOSIS — J449 Chronic obstructive pulmonary disease, unspecified: Secondary | ICD-10-CM

## 2015-05-24 DIAGNOSIS — R0602 Shortness of breath: Secondary | ICD-10-CM | POA: Diagnosis not present

## 2015-05-24 NOTE — Patient Instructions (Signed)
pul Discharge Instructions  Patient Details  Name: Darius Norris MRN: [id number] Date of Birth: Jul 02, 1951 Referring Provider:  [place]   Number of Visits: 36 Reason for Discharge:  Patient reached a stable level of exercise. Patient independent in their exercise.  Smoking History:  History  Smoking status   Not on file  Smokeless tobacco   Not on file    Diagnosis:  Chronic obstructive pulmonary disease, unspecified COPD, unspecified chronic bronchitis type  Initial Exercise Prescription:   Discharge Exercise Prescription:   Functional Capacity:     6 Minute Walk      04/05/15 1018 05/19/15 1044     6 Minute Walk   Phase Mid Program Discharge    Distance 755 feet 760 feet    Walk Time 6 minutes 6 minutes    Resting HR 102 bpm 67 bpm    Resting BP 122/74 mmHg 128/78 mmHg    Max Ex. HR 79 bpm 77 bpm    Max Ex. BP 150/74 mmHg 146/74 mmHg    RPE 15 17    Perceived Dyspnea  4 6    Symptoms  No       Quality of Life:   Personal Goals: Goals established at orientation with interventions provided to work toward goal.    Personal Goals Discharge:   Nutrition & Weight - Outcomes:      Post Biometrics - 05/19/15 1046     Post  Biometrics   Height 5\' 6"  (1.676 m)   Weight 206 lb (93.441 kg)   Waist Circumference 46 inches   Hip Circumference 44.5 inches   Waist to Hip Ratio 1.03 %   BMI (Calculated) 33.3      Nutrition:   Nutrition Discharge:   Education Questionnaire Score:   GD04 Depression Questionnaire Results      Pre 16    Post 10   SF36                                                                   Pre           Post             Physical Fx     13         14                Role Fx: Physical    4    5              Bodily Pain     5    6    General Health    10   10   Vitality     11   12    Social Fx     4   6   Role Fx: Emotional    4   4   Mental Health    19   21    Goals reviewed with  patient; copy given to patient.

## 2015-05-24 NOTE — Progress Notes (Signed)
Daily Session Note  Patient Details  Name: TYMEIR WEATHINGTON MRN: [id number] Date of Birth: [birth year] Referring Provider:  [facility]  Encounter Date: 05/24/2015  Check In:     Session Check In - 05/24/15 1106    Check-In   Staff Present Laureen Owens Shark BS, RRT, Respiratory Therapist;Renee Dillard Essex MS, ACSM CEP Exercise Physiologist;Garin Mata Alfonso Patten, ACSM CEP Exercise Physiologist;Steven Way BS, ACSM EP-C, Exercise Physiologist   ER physicians immediately available to respond to emergencies [name] immediately available ER MD   Physician(s) Jacqualine Code and Kinner   Medication changes reported     No   Fall or balance concerns reported    No   Warm-up and Cool-down Performed on first and last piece of equipment   VAD Patient? No   Pain Assessment   Currently in Pain? No/denies   Multiple Pain Sites No         Goals Met:  Proper associated with RPD/PD & O2 Sat Independence with exercise equipment Exercise tolerated well Strength training completed today  Goals Unmet:  Not Applicable  Goals Comments:    Dr. Emily Filbert is Medical Director for [place] and [name].

## 2015-05-24 NOTE — Patient Instructions (Addendum)
pul Discharge Instructions  Patient Details  Name: Darius Norris MRN: [id number] Date of Birth: October 14, 1951 Referring Provider:  [place]   Number of Visits: 45  Reason for Discharge:  Patient reached a stable level of exercise. Patient independent in their exercise.  Smoking History:  History  Smoking status   Not on file  Smokeless tobacco   Not on file    Diagnosis:  COPD, moderate  Initial Exercise Prescription:   Discharge Exercise Prescription:     Discharge Prescription - 05/24/15 1600    Exercise Review   Progression Yes   Response to Exercise   Blood Pressure (Admit) 128/73 mmHg   Blood Pressure (Exercise) 140/80 mmHg   Blood Pressure (Exit) 116/72 mmHg   Heart Rate (Admit) 69 bpm   Heart Rate (Exercise) 87 bpm   Heart Rate (Exit) 69 bpm   Oxygen Saturation (Admit) 95 %   Oxygen Saturation (Exercise) 94 %   Oxygen Saturation (Exit) 98 %   Rating of Perceived Exertion (Exercise) 13   Perceived Dyspnea (Exercise) 3   Frequency Add 3 additional days to program exercise sessions.   Duration Progress to 50 minutes of aerobic without signs/symptoms of physical distress   Intensity Rest + 30   Progression Continue progressive overload as per policy without signs/symptoms or physical distress.   Resistance Training   Training Prescription Yes   Weight 3   Reps 10-12   Treadmill   MPH 1.4   Grade 1   Minutes 15   NuStep   Level 4   Watts 65   Minutes 15   REL-XR   Level 5   Watts 50   Minutes 15   Home Exercise Plan   Plans to continue exercise at [employer] (comment)  [facility]      Functional Capacity:     6 Minute Walk      04/05/15 1018 05/19/15 1044     6 Minute Walk   Phase Mid Program Discharge    Distance 755 feet 760 feet    Walk Time 6 minutes 6 minutes    Resting HR 102 bpm 67 bpm    Resting BP 122/74 mmHg 128/78 mmHg    Max Ex. HR 79 bpm 77 bpm    Max Ex. BP 150/74 mmHg 146/74 mmHg     RPE 15 17    Perceived Dyspnea  4 6    Symptoms  No       Quality of Life:   Personal Goals: Goals established at orientation with interventions provided to work toward goal.    Personal Goals Discharge:     Personal Goals at Discharge - 05/24/15 1555    Weight Management   Goals Progress/Improvement seen No   Comments Mr Chuba has a weight goal of 150lbs, but he did not lose any weight in the program, last weight 206.3. His wife works second shift, so he has sandwiches at night. He preferred not to meet with the dietitian.   Increase Aerobic Exercise and Physical Activity   Goals Progress/Improvement seen  Yes   Comments Mr Vecchio improved his exercise goals on 3 pieces of equipment and increased his exercise time from88mins to 29mins.   Understand more about Heart/Pulmonary Disease   Goals Progress/Improvement seen  Yes   Comments Mr Levesque attended all the educational class and states he has learned about COPD and self-management of his disease.   Improve shortness of breath with ADL's   Comments  Mr Zelenak's UCSD SOB questionnaire indicated some improvement in shortness of breath. He has learned to PLB and pace himself to help with his breathing.   Breathing Techniques   Goals Progress/Improvement seen  Yes   Comments Using PLB with his exercise and with ADL's.   Increase knowledge of respiratory medications   Goals Progress/Improvement seen  Yes   Comments Mr Weissinger takes his Albuterol MDI and Symbicort MDI correctly and uses a spacer; Uses his SVN as needed and properly cleans the nebulizer; Mr Jiminez has a good understanding of his oxygen and the equipment and uses properly.      Nutrition & Weight - Outcomes:      Post Biometrics - 05/19/15 1046     Post  Biometrics   Height 5\' 6"  (1.676 m)   Weight 206 lb (93.441 kg)   Waist Circumference 46 inches   Hip Circumference 44.5 inches   Waist to Hip Ratio 1.03 %   BMI (Calculated) 33.3       Nutrition:   Nutrition Discharge:   Education Questionnaire Score:   GD04 Depression Questionnaire Results      Pre 16    Post 10   SF36                                                                   Pre           Post             Physical Fx     14         19                Role Fx: Physical     5   5              Bodily Pain     8   8    General Health    12   8   Vitality     13   9    Social Fx     6   6   Role Fx: Emotional    6   6   Mental Health    24   23   Goals reviewed with patient; copy given to patient.

## 2015-05-25 ENCOUNTER — Encounter: Payer: Self-pay | Admitting: *Deleted

## 2015-05-25 NOTE — Progress Notes (Signed)
Discharge Summary  Patient Details  Name: Darius Norris MRN: [id number] Date of Birth: [birth year] Referring Provider:  [place]   Number of Visits: 56 Reason for Discharge:  Patient reached a stable level of exercise. Patient independent in their exercise.  Smoking History:  History  Smoking status   Not on file  Smokeless tobacco   Not on file    Diagnosis:  COPD, moderate  ADL UCSD:     ADL UCSD      04/05/15 1000 05/19/15 1530 05/24/15 0836   ADL [facility]SD   ADL Phase Mid  Exit   SOB Score total 80 73    Rest 1 1    Walk 3 2    Stairs 4 5    Bath 5 4    Dress 3 3    Shop 4 3       Initial Exercise Prescription:   Discharge Exercise Prescription:     Discharge Prescription - 05/24/15 1600    Exercise Review   Progression Yes   Response to Exercise   Blood Pressure (Admit) 128/73 mmHg   Blood Pressure (Exercise) 140/80 mmHg   Blood Pressure (Exit) 116/72 mmHg   Heart Rate (Admit) 69 bpm   Heart Rate (Exercise) 87 bpm   Heart Rate (Exit) 69 bpm   Oxygen Saturation (Admit) 95 %   Oxygen Saturation (Exercise) 94 %   Oxygen Saturation (Exit) 98 %   Rating of Perceived Exertion (Exercise) 13   Perceived Dyspnea (Exercise) 3   Frequency Add 3 additional days to program exercise sessions.   Duration Progress to 50 minutes of aerobic without signs/symptoms of physical distress   Intensity Rest + 30   Progression Continue progressive overload as per policy without signs/symptoms or physical distress.   Resistance Training   Training Prescription Yes   Weight 3   Reps 10-12   Treadmill   MPH 1.4   Grade 1   Minutes 15   NuStep   Level 4   Watts 65   Minutes 15   REL-XR   Level 5   Watts 50   Minutes 15   Home Exercise Plan   Plans to continue exercise at [employer] (comment)  [facility]      Functional Capacity:     6 Minute Walk      04/05/15 1018 05/19/15 1044     6 Minute Walk   Phase Mid Program  Discharge    Distance 755 feet 760 feet    Walk Time 6 minutes 6 minutes    Resting HR 102 bpm 67 bpm    Resting BP 122/74 mmHg 128/78 mmHg    Max Ex. HR 79 bpm 77 bpm    Max Ex. BP 150/74 mmHg 146/74 mmHg    RPE 15 17    Perceived Dyspnea  4 6    Symptoms  No       Psychological, QOL, Others - Outcomes: PHQ 2/9: No flowsheet data found.  Quality of Life:   Personal Goals: Goals established at orientation with interventions provided to work toward goal.    Personal Goals Discharge:     Personal Goals at Discharge - 05/24/15 1555    Weight Management   Goals Progress/Improvement seen No   Comments Darius Norris has a weight goal of 150lbs, but he did not lose any weight in the program, last weight 206.3. His wife works second shift, so he has sandwiches at night. He preferred not  to meet with the dietitian.   Increase Aerobic Exercise and Physical Activity   Goals Progress/Improvement seen  Yes   Comments Darius Norris improved his exercise goals on 3 pieces of equipment and increased his exercise time from42mins to 24mins.   Understand more about Heart/Pulmonary Disease   Goals Progress/Improvement seen  Yes   Comments Darius Norris attended all the educational class and states he has learned about COPD and self-management of his disease.   Improve shortness of breath with ADL's   Comments Darius Norris's UCSD SOB questionnaire indicated some improvement in shortness of breath. He has learned to PLB and pace himself to help with his breathing.   Breathing Techniques   Goals Progress/Improvement seen  Yes   Comments Using PLB with his exercise and with ADL's.   Increase knowledge of respiratory medications   Goals Progress/Improvement seen  Yes   Comments Darius Norris takes his Albuterol MDI and Symbicort MDI correctly and uses a spacer; Uses his SVN as needed and properly cleans the nebulizer; Darius Norris has a good understanding of his oxygen and the equipment and uses properly.       Nutrition & Weight - Outcomes:      Post Biometrics - 05/19/15 1046     Post  Biometrics   Height 5\' 6"  (1.676 m)   Weight 206 lb (93.441 kg)   Waist Circumference 46 inches   Hip Circumference 44.5 inches   Waist to Hip Ratio 1.03 %   BMI (Calculated) 33.3      Nutrition:   Nutrition Discharge:   Education Questionnaire Score:   GD04 Depression Questionnaire Results      Pre 16    Post 10   SF36                                                                   Pre           Post             Physical Fx     14         19                Role Fx: Physical    5   5              Bodily Pain     8   8    General Health    12   8   Vitality                 13              9    Social Fx     6              6   Role Fx: Emotional    6   6   Mental Health    24   23   Goals reviewed with patient; copy given to patient.

## 2015-05-25 NOTE — Progress Notes (Signed)
Pulmonary Individual Treatment Plan  Patient Details  Name: Darius Norris MRN: [id number] Date of Birth: 1951-09-04 Referring Provider:  [facility]  Initial Encounter Date: 11/24/2014    Visit Diagnosis: COPD, moderate  Patient's Home Medications on Admission: No current outpatient prescriptions on file.  Past Medical History: No past medical history on file.  Tobacco Use: History  Smoking status   Not on file  Smokeless tobacco   Not on file    Labs: Recent Review Flowsheet Data    There is no flowsheet data to display.       ADL UCSD:     ADL UCSD      04/05/15 1000 05/19/15 1530 05/24/15 0836   ADL UCSD   ADL Phase Mid  Exit   SOB Score total 80 73    Rest 1 1    Walk 3 2    Stairs 4 5    Bath 5 4    Dress 3 3    Shop 4 3        Pulmonary Function Assessment:   Exercise Target Goals:    Exercise Program Goal: Individual exercise prescription set with THRR, safety & activity barriers. Participant demonstrates ability to understand and report RPE using BORG scale, to self-measure pulse accurately, and to acknowledge the importance of the exercise prescription.  Exercise Prescription Goal: Starting with aerobic activity 30 plus minutes a day, 3 days per week for initial exercise prescription. Provide home exercise prescription and guidelines that participant acknowledges understanding prior to discharge.  Activity Barriers & Risk Stratification:   6 Minute Walk:     6 Minute Walk      04/05/15 1018 05/19/15 1044     6 Minute Walk   Phase Mid Program Discharge    Distance 755 feet 760 feet    Walk Time 6 minutes 6 minutes    Resting HR 102 bpm 67 bpm    Resting BP 122/74 mmHg 128/78 mmHg    Max Ex. HR 79 bpm 77 bpm    Max Ex. BP 150/74 mmHg 146/74 mmHg    RPE 15 17    Perceived Dyspnea  4 6    Symptoms  No       Initial Exercise Prescription:   Exercise Prescription Changes:     Exercise Prescription Changes       04/23/15 1000 04/26/15 1100 05/03/15 1100 05/05/15 1000 05/10/15 1300   Exercise Review   Progression  Yes Yes Yes Yes   Response to Exercise   Blood Pressure (Admit)     120/70 mmHg   Blood Pressure (Exercise)     150/78 mmHg   Blood Pressure (Exit)     110/64 mmHg   Heart Rate (Admit)     82 bpm   Heart Rate (Exercise)     90 bpm   Heart Rate (Exit)     64 bpm   Oxygen Saturation (Admit)     90 %   Oxygen Saturation (Exercise)     96 %   Oxygen Saturation (Exit)     97 %   Rating of Perceived Exertion (Exercise)     12   Perceived Dyspnea (Exercise)     3   Resistance Training   Training Prescription     Yes   Weight     3   Reps     10-12   Treadmill   MPH     1.4   Grade  0   Minutes     15   NuStep   Level  4   4   Watts  45   65   Minutes  15  15 15    Recumbant Elliptical   Watts --       REL-XR   Level   5  5   Watts   50  50   Minutes     15     05/12/15 1000           Exercise Review   Progression Yes       Treadmill   MPH 1.4       Grade 1       Minutes 15          Discharge Exercise Prescription:     Discharge Prescription - 05/24/15 1600    Exercise Review   Progression Yes   Response to Exercise   Blood Pressure (Admit) 128/73 mmHg   Blood Pressure (Exercise) 140/80 mmHg   Blood Pressure (Exit) 116/72 mmHg   Heart Rate (Admit) 69 bpm   Heart Rate (Exercise) 87 bpm   Heart Rate (Exit) 69 bpm   Oxygen Saturation (Admit) 95 %   Oxygen Saturation (Exercise) 94 %   Oxygen Saturation (Exit) 98 %   Rating of Perceived Exertion (Exercise) 13   Perceived Dyspnea (Exercise) 3   Frequency Add 3 additional days to program exercise sessions.   Duration Progress to 50 minutes of aerobic without signs/symptoms of physical distress   Intensity Rest + 30   Progression Continue progressive overload as per policy without signs/symptoms or physical distress.   Resistance Training   Training Prescription Yes   Weight 3   Reps 10-12   Treadmill    MPH 1.4   Grade 1   Minutes 15   NuStep   Level 4   Watts 65   Minutes 15   REL-XR   Level 5   Watts 50   Minutes 15   Home Exercise Plan   Plans to continue exercise at [employer] (comment)  [facility]       Nutrition:  Target Goals: Understanding of nutrition guidelines, daily intake of sodium 1500mg , cholesterol 200mg , calories 30% from fat and 7% or less from saturated fats, daily to have 5 or more servings of fruits and vegetables.  Biometrics:      Post Biometrics - 05/19/15 1046     Post  Biometrics   Height 5\' 6"  (1.676 m)   Weight 206 lb (93.441 kg)   Waist Circumference 46 inches   Hip Circumference 44.5 inches   Waist to Hip Ratio 1.03 %   BMI (Calculated) 33.3      Nutrition Therapy Plan and Nutrition Goals:   Nutrition Discharge: Rate Your Plate Scores:   Psychosocial: Target Goals: Acknowledge presence or absence of depression, maximize coping skills, provide positive support system. Participant is able to verbalize types and ability to use techniques and skills needed for reducing stress and depression.  Initial Review & Psychosocial Screening:   Quality of Life Scores:  GD04 Depression Questionnaire Results      Pre 16    Post 10   SF36  Pre           Post             Physical Fx     14         19                Role Fx: Physical    5   5              Bodily Pain     8   8    General Health    12   8   Vitality     13   9    Social Fx     6   6   Role Fx: Emotional   6   6             Mental Health    24   23   PHQ-9:     Recent Review Flowsheet Data    There is no flowsheet data to display.      Psychosocial Evaluation and Intervention:   Psychosocial Re-Evaluation:     Psychosocial Re-Evaluation      04/21/15 1108           Psychosocial Re-Evaluation   Comments Follow up with Darius Norris today reporting progress since  coming into this program with less shortness of breath and his ability to do more of his normal activities now.  He has not seen his doctor yet about a sleep study or his panic symptoms since his next scheduled appointment is in July.   He reports ongoing issues with sleep and panic but his sugar levels have regulated better as a result of eating smaller meals more often.  Darius Norris plans to begin working out at the [facility] locally upon completiong of this program in order to maintain his progress.       Continued Psychosocial Services Needed Yes  [name] will benefit from a sleep study to address his interrupted sleep and also a med eval to address ongoing panic symptoms.         Education: Education Goals: Education classes will be provided on a weekly basis, covering required topics. Participant will state understanding/return demonstration of topics presented.  Learning Barriers/Preferences:   Education Topics: Initial Evaluation Education: - Verbal, written and demonstration of respiratory meds, RPE/PD scales, oximetry and breathing techniques. Instruction on use of nebulizers and MDIs: cleaning and proper use, rinsing mouth with steroid doses and importance of monitoring MDI activations.   General Nutrition Guidelines/Fats and Fiber: -Group instruction provided by verbal, written material, models and posters to present the general guidelines for heart healthy nutrition. Gives an explanation and review of dietary fats and fiber.   Controlling Sodium/Reading Food Labels: -Group verbal and written material supporting the discussion of sodium use in heart healthy nutrition. Review and explanation with models, verbal and written materials for utilization of the food label.   Exercise Physiology & Risk Factors: - Group verbal and written instruction with models to review the exercise physiology of the cardiovascular system and associated critical values. Details cardiovascular disease  risk factors and the goals associated with each risk factor.   Aerobic Exercise & Resistance Training: - Gives group verbal and written discussion on the health impact of inactivity. On the components of aerobic and resistive training programs and the benefits of this training and how to safely progress through these programs.   Flexibility, Balance, General Exercise Guidelines: - Provides  group verbal and written instruction on the benefits of flexibility and balance training programs. Provides general exercise guidelines with specific guidelines to those with heart or lung disease. Demonstration and skill practice provided.   Stress Management: - Provides group verbal and written instruction about the health risks of elevated stress, cause of high stress, and healthy ways to reduce stress.   Depression: - Provides group verbal and written instruction on the correlation between heart/lung disease and depressed mood, treatment options, and the stigmas associated with seeking treatment.   Exercise & Equipment Safety: - Individual verbal instruction and demonstration of equipment use and safety with use of the equipment.   Infection Prevention: - Provides verbal and written material to individual with discussion of infection control including proper hand washing and proper equipment cleaning during exercise session.   Falls Prevention: - Provides verbal and written material to individual with discussion of falls prevention and safety.   Diabetes: - Individual verbal and written instruction to review signs/symptoms of diabetes, desired ranges of glucose level fasting, after meals and with exercise. Advice that pre and post exercise glucose checks will be done for 3 sessions at entry of program.   Chronic Lung Diseases: - Group verbal and written instruction to review new updates, new respiratory medications, new advancements in procedures and treatments. Provide informative websites  and "800" numbers of self-education.   Lung Procedures: - Group verbal and written instruction to describe testing methods done to diagnose lung disease. Review the outcome of test results. Describe the treatment choices: Pulmonary Function Tests, ABGs and oximetry.   Energy Conservation: - Provide group verbal and written instruction for methods to conserve energy, plan and organize activities. Instruct on pacing techniques, use of adaptive equipment and posture/positioning to relieve shortness of breath.          Most Recent Value   Date  05/12/15   Educator  SW   Instruction Review Code  2- meets goals/outcomes      Triggers: - Group verbal and written instruction to review types of environmental controls: home humidity, furnaces, filters, dust mite/pet prevention, HEPA vacuums. To discuss weather changes, air quality and the benefits of nasal washing.   Exacerbations: - Group verbal and written instruction to provide: warning signs, infection symptoms, calling MD promptly, preventive modes, and value of vaccinations. Review: effective airway clearance, coughing and/or vibration techniques. Create an Sports administrator.      Most Recent Value   Date  05/24/15   Educator  L.Owens Shark   Instruction Review Code  2- meets goals/outcomes      Oxygen: - Individual and group verbal and written instruction on oxygen therapy. Includes supplement oxygen, available portable oxygen systems, continuous and intermittent flow rates, oxygen safety, concentrators, and Medicare reimbursement for oxygen.   Respiratory Medications: - Group verbal and written instruction to review medications for lung disease. Drug class, frequency, complications, importance of spacers, rinsing mouth after steroid MDI's, and proper cleaning methods for nebulizers.   AED/CPR: - Group verbal and written instruction with the use of models to demonstrate the basic use of the AED with the basic ABC's of  resuscitation.   Breathing Retraining: - Provides individuals verbal and written instruction on purpose, frequency, and proper technique of diaphragmatic breathing and pursed-lipped breathing. Applies individual practice skills.   Anatomy and Physiology of the Lungs: - Group verbal and written instruction with the use of models to provide basic lung anatomy and physiology related to function, structure and complications of lung disease.  Heart Failure: - Group verbal and written instruction on the basics of heart failure: signs/symptoms, treatments, explanation of ejection fraction, enlarged heart and cardiomyopathy.   Sleep Apnea: - Individual verbal and written instruction to review Obstructive Sleep Apnea. Review of risk factors, methods for diagnosing and types of masks and machines for OSA.   Anxiety: - Provides group, verbal and written instruction on the correlation between heart/lung disease and anxiety, treatment options, and management of anxiety.   Relaxation: - Provides group, verbal and written instruction about the benefits of relaxation for patients with heart/lung disease. Also provides patients with examples of relaxation techniques.   Knowledge Questionnaire Score:   Personal Goals and Risk Factors at Admission:   Personal Goals and Risk Factors Review:      Goals and Risk Factor Review      05/21/15 1402           Increase Aerobic Exercise and Physical Activity   Goals Progress/Improvement seen  Yes       Comments Post 77mwd improved 168ft, 35%; and Mr Raider could walk 93minutes vs pre45mwd was 90minutes          Personal Goals Discharge:      Personal Goals at Discharge - 05/24/15 1555    Weight Management   Goals Progress/Improvement seen No   Comments Mr Charters has a weight goal of 150lbs, but he did not lose any weight in the program, last weight 206.3. His wife works second shift, so he has sandwiches at night. He preferred not to meet  with the dietitian.   Increase Aerobic Exercise and Physical Activity   Goals Progress/Improvement seen  Yes   Comments Mr Purington improved his exercise goals on 3 pieces of equipment and increased his exercise time from41mins to 45mins.   Understand more about Heart/Pulmonary Disease   Goals Progress/Improvement seen  Yes   Comments Mr Demmer attended all the educational class and states he has learned about COPD and self-management of his disease.   Improve shortness of breath with ADL's   Comments Mr Klingler's UCSD SOB questionnaire indicated some improvement in shortness of breath. He has learned to PLB and pace himself to help with his breathing.   Breathing Techniques   Goals Progress/Improvement seen  Yes   Comments Using PLB with his exercise and with ADL's.   Increase knowledge of respiratory medications   Goals Progress/Improvement seen  Yes   Comments Mr Lienau takes his Albuterol MDI and Symbicort MDI correctly and uses a spacer; Uses his SVN as needed and properly cleans the nebulizer; Mr Lechuga has a good understanding of his oxygen and the equipment and uses properly.      Comments: Thank you for the opportunity to work with your patient, Mr Jeran Hiltz.

## 2015-05-25 NOTE — Progress Notes (Signed)
Discharge Summary  Patient Details  Name: Darius Norris MRN: [id number] Date of Birth: [birth year] Referring Provider:  [facility]   Number of Visits: 49  Reason for Discharge:  Patient reached a stable level of exercise. Patient independent in their exercise.  Smoking History:  History  Smoking status   Not on file  Smokeless tobacco   Not on file    Diagnosis:  COPD, moderate  ADL UCSD:     ADL UCSD      04/05/15 1000 05/19/15 1530 05/24/15 0836   ADL UCSD   ADL Phase Mid  Exit   SOB Score total 80 73    Rest 1 1    Walk 3 2    Stairs 4 5    Bath 5 4    Dress 3 3    Shop 4 3       Initial Exercise Prescription:   Discharge Exercise Prescription:     Discharge Prescription - 05/24/15 1600    Exercise Review   Progression Yes   Response to Exercise   Blood Pressure (Admit) 128/73 mmHg   Blood Pressure (Exercise) 140/80 mmHg   Blood Pressure (Exit) 116/72 mmHg   Heart Rate (Admit) 69 bpm   Heart Rate (Exercise) 87 bpm   Heart Rate (Exit) 69 bpm   Oxygen Saturation (Admit) 95 %   Oxygen Saturation (Exercise) 94 %   Oxygen Saturation (Exit) 98 %   Rating of Perceived Exertion (Exercise) 13   Perceived Dyspnea (Exercise) 3   Frequency Add 3 additional days to program exercise sessions.   Duration Progress to 50 minutes of aerobic without signs/symptoms of physical distress   Intensity Rest + 30   Progression Continue progressive overload as per policy without signs/symptoms or physical distress.   Resistance Training   Training Prescription Yes   Weight 3   Reps 10-12   Treadmill   MPH 1.4   Grade 1   Minutes 15   NuStep   Level 4   Watts 65   Minutes 15   REL-XR   Level 5   Watts 50   Minutes 15   Home Exercise Plan   Plans to continue exercise at [employer] (comment)  [facility]      Functional Capacity:     6 Minute Walk      04/05/15 1018 05/19/15 1044     6 Minute Walk   Phase Mid Program  Discharge    Distance 755 feet 760 feet    Walk Time 6 minutes 6 minutes    Resting HR 102 bpm 67 bpm    Resting BP 122/74 mmHg 128/78 mmHg    Max Ex. HR 79 bpm 77 bpm    Max Ex. BP 150/74 mmHg 146/74 mmHg    RPE 15 17    Perceived Dyspnea  4 6    Symptoms  No       Psychological, QOL, Others - Outcomes: PHQ 2/9: No flowsheet data found.  Quality of Life:   Personal Goals: Goals established at orientation with interventions provided to work toward goal.    Personal Goals Discharge:     Personal Goals at Discharge - 05/24/15 1555    Weight Management   Goals Progress/Improvement seen No   Comments Darius Norris has a weight goal of 150lbs, but he did not lose any weight in the program, last weight 206.3. His wife works second shift, so he has sandwiches at night. He preferred  not to meet with the dietitian.   Increase Aerobic Exercise and Physical Activity   Goals Progress/Improvement seen  Yes   Comments Darius Norris improved his exercise goals on 3 pieces of equipment and increased his exercise time from85mins to 86mins.   Understand more about Heart/Pulmonary Disease   Goals Progress/Improvement seen  Yes   Comments Darius Norris attended all the educational class and states he has learned about COPD and self-management of his disease.   Improve shortness of breath with ADL's   Comments Darius Norris UCSD SOB questionnaire indicated some improvement in shortness of breath. He has learned to PLB and pace himself to help with his breathing.   Breathing Techniques   Goals Progress/Improvement seen  Yes   Comments Using PLB with his exercise and with ADL's.   Increase knowledge of respiratory medications   Goals Progress/Improvement seen  Yes   Comments Darius Norris takes his Albuterol MDI and Symbicort MDI correctly and uses a spacer; Uses his SVN as needed and properly cleans the nebulizer; Darius Norris has a good understanding of his oxygen and the equipment and uses properly.       Nutrition & Weight - Outcomes:      Post Biometrics - 05/19/15 1046     Post  Biometrics   Height 5\' 6"  (1.676 m)   Weight 206 lb (93.441 kg)   Waist Circumference 46 inches   Hip Circumference 44.5 inches   Waist to Hip Ratio 1.03 %   BMI (Calculated) 33.3      Nutrition:   Nutrition Discharge:   Education Questionnaire Score:   Goals reviewed with patient; copy given to patient.

## 2015-05-26 DIAGNOSIS — R0602 Shortness of breath: Secondary | ICD-10-CM | POA: Diagnosis not present

## 2015-05-26 DIAGNOSIS — J449 Chronic obstructive pulmonary disease, unspecified: Secondary | ICD-10-CM

## 2015-05-26 NOTE — Progress Notes (Signed)
Daily Session Note ° °Patient Details  °Name: Kasyn M Aron °MRN: [id number] °Date of Birth: 10/28/1951 °Referring Provider:  [facility] ° °Encounter Date: 05/26/2015 ° °Check In: °  °  °Session Check In - 05/26/15 1030   ° Check-In  ° Staff Present Steven Way BS, ACSM EP-C, Exercise Physiologist;Renee MacMillan MS, ACSM CEP Exercise Physiologist;Laureen Brown BS, RRT, Respiratory Therapist  ° ER physicians immediately available to respond to emergencies [name] immediately available ER MD  ° Physician(s) Schaevit and Goodman  ° Medication changes reported     No  ° Fall or balance concerns reported    No  ° Warm-up and Cool-down Performed on first and last piece of equipment  ° VAD Patient? No  ° Pain Assessment  ° Currently in Pain? No/denies  °  ° ° ° ° ° °Goals Met:  °Proper associated with RPD/PD & O2 Sat °Independence with exercise equipment °Using PLB without cueing & demonstrates good technique °Exercise tolerated well °Personal goals reviewed °Strength training completed today ° °Goals Unmet:  °Not Applicable ° °Goals Comments: Mr Takashi Shetterly graduated from [name] today. His discharge summary was reviewed, and [facility]  ° ° °Dr. Mark Miller is Medical Director for [facility] and [name]. °

## 2015-05-26 NOTE — Progress Notes (Signed)
Daily Session Note  Patient Details  Name: Darius Norris MRN: [id number] Date of Birth: 01/27/1951 Referring Provider:  [facility]  Encounter Date: 05/26/2015  Check In:     Session Check In - 05/26/15 1030    Check-In   Staff Present Lestine Box BS, ACSM EP-C, Exercise Physiologist;Renee Dillard Essex MS, ACSM CEP Exercise Physiologist;Laureen Janell Quiet, RRT, Respiratory Therapist   ER physicians immediately available to respond to emergencies [name] immediately available ER MD   Physician(s) Claudette Head and [name] Balboa   Medication changes reported     No   Fall or balance concerns reported    No   Warm-up and Cool-down Performed on first and last piece of equipment   VAD Patient? No   Pain Assessment   Currently in Pain? No/denies         Goals Met:  Proper associated with RPD/PD & O2 Sat Independence with exercise equipment Improved SOB with ADL's Exercise tolerated well No report of cardiac concerns or symptoms Strength training completed today  Goals Unmet:  Not Applicable  Goals Comments:    Dr. Emily Filbert is Medical Director for [facility] and [name].

## 2015-12-14 ENCOUNTER — Encounter: Payer: Self-pay | Admitting: Intensive Care

## 2015-12-14 ENCOUNTER — Emergency Department: Payer: Medicare Other

## 2015-12-14 ENCOUNTER — Observation Stay
Admission: EM | Admit: 2015-12-14 | Discharge: 2015-12-15 | Disposition: A | Discharge status: Home / Self Care | Payer: Medicare Other | Attending: Internal Medicine | Admitting: Internal Medicine

## 2015-12-14 DIAGNOSIS — Z888 Allergy status to other drugs, medicaments and biological substances status: Secondary | ICD-10-CM | POA: Diagnosis not present

## 2015-12-14 DIAGNOSIS — R0789 Other chest pain: Secondary | ICD-10-CM | POA: Diagnosis not present

## 2015-12-14 DIAGNOSIS — E782 Mixed hyperlipidemia: Secondary | ICD-10-CM | POA: Insufficient documentation

## 2015-12-14 DIAGNOSIS — J439 Emphysema, unspecified: Secondary | ICD-10-CM | POA: Diagnosis not present

## 2015-12-14 DIAGNOSIS — Z885 Allergy status to narcotic agent status: Secondary | ICD-10-CM | POA: Diagnosis not present

## 2015-12-14 DIAGNOSIS — Z79899 Other long term (current) drug therapy: Secondary | ICD-10-CM | POA: Diagnosis not present

## 2015-12-14 DIAGNOSIS — J441 Chronic obstructive pulmonary disease with (acute) exacerbation: Secondary | ICD-10-CM | POA: Diagnosis not present

## 2015-12-14 DIAGNOSIS — R9431 Abnormal electrocardiogram [ECG] [EKG]: Secondary | ICD-10-CM | POA: Diagnosis not present

## 2015-12-14 DIAGNOSIS — Z87891 Personal history of nicotine dependence: Secondary | ICD-10-CM | POA: Diagnosis not present

## 2015-12-14 DIAGNOSIS — I25119 Atherosclerotic heart disease of native coronary artery with unspecified angina pectoris: Secondary | ICD-10-CM | POA: Insufficient documentation

## 2015-12-14 DIAGNOSIS — R778 Other specified abnormalities of plasma proteins: Secondary | ICD-10-CM | POA: Diagnosis present

## 2015-12-14 DIAGNOSIS — Z7951 Long term (current) use of inhaled steroids: Secondary | ICD-10-CM | POA: Insufficient documentation

## 2015-12-14 DIAGNOSIS — I2510 Atherosclerotic heart disease of native coronary artery without angina pectoris: Secondary | ICD-10-CM | POA: Diagnosis present

## 2015-12-14 DIAGNOSIS — J9811 Atelectasis: Secondary | ICD-10-CM | POA: Insufficient documentation

## 2015-12-14 DIAGNOSIS — I10 Essential (primary) hypertension: Secondary | ICD-10-CM | POA: Diagnosis present

## 2015-12-14 DIAGNOSIS — R748 Abnormal levels of other serum enzymes: Secondary | ICD-10-CM | POA: Diagnosis not present

## 2015-12-14 DIAGNOSIS — R05 Cough: Secondary | ICD-10-CM | POA: Insufficient documentation

## 2015-12-14 DIAGNOSIS — Z881 Allergy status to other antibiotic agents status: Secondary | ICD-10-CM | POA: Diagnosis not present

## 2015-12-14 DIAGNOSIS — K529 Noninfective gastroenteritis and colitis, unspecified: Secondary | ICD-10-CM | POA: Diagnosis not present

## 2015-12-14 DIAGNOSIS — K219 Gastro-esophageal reflux disease without esophagitis: Secondary | ICD-10-CM | POA: Diagnosis not present

## 2015-12-14 DIAGNOSIS — Z9981 Dependence on supplemental oxygen: Secondary | ICD-10-CM | POA: Diagnosis not present

## 2015-12-14 DIAGNOSIS — R079 Chest pain, unspecified: Secondary | ICD-10-CM | POA: Diagnosis present

## 2015-12-14 DIAGNOSIS — R7989 Other specified abnormal findings of blood chemistry: Secondary | ICD-10-CM | POA: Diagnosis present

## 2015-12-14 DIAGNOSIS — I209 Angina pectoris, unspecified: Secondary | ICD-10-CM | POA: Diagnosis present

## 2015-12-14 HISTORY — DX: Emphysema, unspecified: J43.9

## 2015-12-14 HISTORY — DX: Essential (primary) hypertension: I10

## 2015-12-14 HISTORY — DX: Atherosclerotic heart disease of native coronary artery without angina pectoris: I25.10

## 2015-12-14 HISTORY — DX: Chronic obstructive pulmonary disease, unspecified: J44.9

## 2015-12-14 HISTORY — DX: Gastro-esophageal reflux disease without esophagitis: K21.9

## 2015-12-14 HISTORY — DX: Noninfective gastroenteritis and colitis, unspecified: K52.9

## 2015-12-14 LAB — BASIC METABOLIC PANEL
ANION GAP: 9 (ref 5–15)
BUN: 10 mg/dL (ref 6–20)
CHLORIDE: 100 mmol/L — AB (ref 101–111)
CO2: 29 mmol/L (ref 22–32)
CREATININE: 1.01 mg/dL (ref 0.61–1.24)
Calcium: 9 mg/dL (ref 8.9–10.3)
GFR calc non Af Amer: >60 mL/min (ref >60)
Glucose, Bld: 176 mg/dL — ABNORMAL HIGH (ref 65–99)
POTASSIUM: 3.6 mmol/L (ref 3.5–5.1)
SODIUM: 138 mmol/L (ref 135–145)

## 2015-12-14 LAB — CBC
HEMATOCRIT: 46.5 % (ref 40.0–52.0)
HEMOGLOBIN: 15.2 g/dL (ref 13.0–18.0)
MCH: 28.6 pg (ref 26.0–34.0)
MCHC: 32.8 g/dL (ref 32.0–36.0)
MCV: 87 fL (ref 80.0–100.0)
PLATELETS: 195 10*3/uL (ref 150–440)
RBC: 5.34 MIL/uL (ref 4.40–5.90)
RDW: 17.2 % — ABNORMAL HIGH (ref 11.5–14.5)
WBC: 8.4 10*3/uL (ref 3.8–10.6)

## 2015-12-14 LAB — TROPONIN I: Troponin I: 0.07 ng/mL — ABNORMAL HIGH (ref <0.031)

## 2015-12-14 MED ORDER — NITROGLYCERIN 2 % TD OINT
1.0000 [in_us] | TOPICAL_OINTMENT | Freq: Once | TRANSDERMAL | Status: AC
  Administered 2015-12-15: 1 [in_us] via TOPICAL
  Filled 2015-12-14: qty 1

## 2015-12-14 MED ORDER — METHYLPREDNISOLONE SODIUM SUCC 125 MG IJ SOLR
125.0000 mg | Freq: Once | INTRAMUSCULAR | Status: AC
  Administered 2015-12-14: 125 mg via INTRAVENOUS
  Filled 2015-12-14: qty 2

## 2015-12-14 MED ORDER — LORAZEPAM 2 MG/ML IJ SOLN
0.5000 mg | Freq: Once | INTRAMUSCULAR | Status: AC
  Administered 2015-12-14: 0.5 mg via INTRAVENOUS
  Filled 2015-12-14: qty 1

## 2015-12-14 MED ORDER — ASPIRIN 81 MG PO CHEW
324.0000 mg | CHEWABLE_TABLET | Freq: Once | ORAL | Status: AC
  Administered 2015-12-15: 324 mg via ORAL
  Filled 2015-12-14: qty 4

## 2015-12-14 NOTE — ED Notes (Signed)
Patient arrived by EMS from home for SOB. HX of COPD, pt wears 2L continuous. Patient had EMS called to home at 3:00pm and was given 1 neb treatment and EMS left. Pt started to feel SOB again and called EMS back out

## 2015-12-14 NOTE — ED Notes (Signed)
CRITICAL VALUE ALERT  Critical value received:  Troponin 0.07  Date of notification:  12/14/2015  Time of notification:  2304  Critical value read back:Yes.    Nurse who received alert:  Gianah Batt  MD notified (1st page):  quigley  Time of first page:  2304  MD notified (2nd page):  Time of second page:  Responding MD:  Marcelene Butte  Time MD responded:  2304

## 2015-12-15 ENCOUNTER — Encounter: Payer: Self-pay | Admitting: Internal Medicine

## 2015-12-15 DIAGNOSIS — I10 Essential (primary) hypertension: Secondary | ICD-10-CM | POA: Diagnosis present

## 2015-12-15 DIAGNOSIS — I209 Angina pectoris, unspecified: Secondary | ICD-10-CM | POA: Diagnosis present

## 2015-12-15 DIAGNOSIS — I2510 Atherosclerotic heart disease of native coronary artery without angina pectoris: Secondary | ICD-10-CM | POA: Diagnosis present

## 2015-12-15 DIAGNOSIS — R7989 Other specified abnormal findings of blood chemistry: Secondary | ICD-10-CM | POA: Diagnosis present

## 2015-12-15 DIAGNOSIS — J441 Chronic obstructive pulmonary disease with (acute) exacerbation: Secondary | ICD-10-CM | POA: Diagnosis present

## 2015-12-15 DIAGNOSIS — R778 Other specified abnormalities of plasma proteins: Secondary | ICD-10-CM | POA: Diagnosis present

## 2015-12-15 DIAGNOSIS — K219 Gastro-esophageal reflux disease without esophagitis: Secondary | ICD-10-CM | POA: Diagnosis present

## 2015-12-15 LAB — BASIC METABOLIC PANEL
Anion gap: 9 (ref 5–15)
BUN: 10 mg/dL (ref 6–20)
CALCIUM: 9.2 mg/dL (ref 8.9–10.3)
CO2: 28 mmol/L (ref 22–32)
Chloride: 99 mmol/L — ABNORMAL LOW (ref 101–111)
Creatinine, Ser: 1.01 mg/dL (ref 0.61–1.24)
GFR calc Af Amer: >60 mL/min (ref >60)
Glucose, Bld: 156 mg/dL — ABNORMAL HIGH (ref 65–99)
POTASSIUM: 4.3 mmol/L (ref 3.5–5.1)
Sodium: 136 mmol/L (ref 135–145)

## 2015-12-15 LAB — CBC
HEMATOCRIT: 49.3 % (ref 40.0–52.0)
HEMOGLOBIN: 16 g/dL (ref 13.0–18.0)
MCH: 28.1 pg (ref 26.0–34.0)
MCHC: 32.3 g/dL (ref 32.0–36.0)
MCV: 87 fL (ref 80.0–100.0)
Platelets: 204 10*3/uL (ref 150–440)
RBC: 5.67 MIL/uL (ref 4.40–5.90)
RDW: 17 % — ABNORMAL HIGH (ref 11.5–14.5)
WBC: 8.7 10*3/uL (ref 3.8–10.6)

## 2015-12-15 LAB — TROPONIN I

## 2015-12-15 MED ORDER — PREDNISONE 10 MG (21) PO TBPK: 10.0000 mg | ORAL_TABLET | Freq: Every day | ORAL | Status: DC

## 2015-12-15 MED ORDER — GABAPENTIN 300 MG PO CAPS
300.0000 mg | ORAL_CAPSULE | Freq: Three times a day (TID) | ORAL | Status: DC
  Administered 2015-12-15: 06:00:00 300 mg via ORAL
  Filled 2015-12-15: qty 1

## 2015-12-15 MED ORDER — ALPRAZOLAM 0.25 MG PO TABS: 0.2500 mg | ORAL_TABLET | Freq: Two times a day (BID) | ORAL | Status: DC | PRN

## 2015-12-15 MED ORDER — ENALAPRIL MALEATE 2.5 MG PO TABS: 2.5000 mg | ORAL_TABLET | Freq: Every day | ORAL | Status: DC

## 2015-12-15 MED ORDER — METOPROLOL TARTRATE 50 MG PO TABS
100.0000 mg | ORAL_TABLET | Freq: Two times a day (BID) | ORAL | Status: DC
  Administered 2015-12-15: 09:00:00 100 mg via ORAL
  Filled 2015-12-15: qty 2

## 2015-12-15 MED ORDER — SODIUM CHLORIDE 0.9 % IJ SOLN
3.0000 mL | Freq: Two times a day (BID) | INTRAMUSCULAR | Status: DC
  Administered 2015-12-15 (×2): 3 mL via INTRAVENOUS

## 2015-12-15 MED ORDER — IPRATROPIUM-ALBUTEROL 0.5-2.5 (3) MG/3ML IN SOLN: 3.0000 mL | Freq: Four times a day (QID) | RESPIRATORY_TRACT | Status: DC | PRN

## 2015-12-15 MED ORDER — ACETAMINOPHEN 325 MG PO TABS
650.0000 mg | ORAL_TABLET | Freq: Four times a day (QID) | ORAL | Status: DC | PRN
  Administered 2015-12-15 (×2): 650 mg via ORAL
  Filled 2015-12-15 (×3): qty 2

## 2015-12-15 MED ORDER — ENOXAPARIN SODIUM 40 MG/0.4ML ~~LOC~~ SOLN: 40.0000 mg | SUBCUTANEOUS | Status: DC

## 2015-12-15 MED ORDER — ONDANSETRON HCL 4 MG/2ML IJ SOLN: 4.0000 mg | Freq: Four times a day (QID) | INTRAMUSCULAR | Status: DC | PRN

## 2015-12-15 MED ORDER — ALBUTEROL SULFATE HFA 108 (90 BASE) MCG/ACT IN AERS: 2.0000 | INHALATION_SPRAY | Freq: Four times a day (QID) | RESPIRATORY_TRACT | Status: DC | PRN

## 2015-12-15 MED ORDER — BUDESONIDE-FORMOTEROL FUMARATE 160-4.5 MCG/ACT IN AERO
2.0000 | INHALATION_SPRAY | Freq: Two times a day (BID) | RESPIRATORY_TRACT | Status: DC
Start: 2015-12-15 — End: 2015-12-15
  Filled 2015-12-15: qty 6

## 2015-12-15 MED ORDER — IPRATROPIUM-ALBUTEROL 20-100 MCG/ACT IN AERS: 1.0000 | INHALATION_SPRAY | Freq: Four times a day (QID) | RESPIRATORY_TRACT | Status: DC | PRN

## 2015-12-15 MED ORDER — METHYLPREDNISOLONE SODIUM SUCC 125 MG IJ SOLR
60.0000 mg | Freq: Two times a day (BID) | INTRAMUSCULAR | Status: DC
  Administered 2015-12-15 (×2): 60 mg via INTRAVENOUS
  Filled 2015-12-15 (×3): qty 2

## 2015-12-15 MED ORDER — IPRATROPIUM-ALBUTEROL 0.5-2.5 (3) MG/3ML IN SOLN: 3.0000 mL | RESPIRATORY_TRACT | Status: DC | PRN

## 2015-12-15 MED ORDER — ACETAMINOPHEN 650 MG RE SUPP: 650.0000 mg | Freq: Four times a day (QID) | RECTAL | Status: DC | PRN

## 2015-12-15 MED ORDER — RAMELTEON 8 MG PO TABS
8.0000 mg | ORAL_TABLET | Freq: Every evening | ORAL | Status: DC | PRN
  Filled 2015-12-15: qty 1

## 2015-12-15 MED ORDER — ONDANSETRON HCL 4 MG PO TABS: 4.0000 mg | ORAL_TABLET | Freq: Four times a day (QID) | ORAL | Status: DC | PRN

## 2015-12-15 MED ORDER — ALPRAZOLAM 0.25 MG PO TABS
0.2500 mg | ORAL_TABLET | Freq: Three times a day (TID) | ORAL | Status: DC | PRN
  Administered 2015-12-15: 0.25 mg via ORAL
  Filled 2015-12-15 (×2): qty 1

## 2015-12-15 MED ORDER — PANTOPRAZOLE SODIUM 40 MG PO TBEC
40.0000 mg | DELAYED_RELEASE_TABLET | Freq: Every day | ORAL | Status: DC
  Administered 2015-12-15: 40 mg via ORAL
  Filled 2015-12-15: qty 1

## 2015-12-15 MED ORDER — METOPROLOL TARTRATE 25 MG PO TABS: 25.0000 mg | ORAL_TABLET | Freq: Once | ORAL | Status: DC

## 2015-12-15 MED ORDER — METOPROLOL TARTRATE 25 MG PO TABS
25.0000 mg | ORAL_TABLET | Freq: Once | ORAL | Status: AC
  Administered 2015-12-15: 14:00:00 25 mg via ORAL
  Filled 2015-12-15: qty 1

## 2015-12-15 MED ORDER — ISOSORBIDE MONONITRATE ER 30 MG PO TB24
60.0000 mg | ORAL_TABLET | Freq: Every day | ORAL | Status: DC
  Administered 2015-12-15: 60 mg via ORAL
  Filled 2015-12-15: qty 2

## 2015-12-15 NOTE — ED Provider Notes (Signed)
Time Seen: Approximately 2150  I have reviewed the triage notes  Chief Complaint: Shortness of Breath and Chest Pain   History of Present Illness: Darius Norris is a 64 y.o. male *who states he's had some shortness of breath over the last 2-3 days. He states is mostly with exertion. He states he started having some chest pain earlier today at approximately 3 PM. He denies any radiation of the pain and states it started on the left and then seemed to move on her right sides been relatively persistent since. He denies it being obviously pruritic in nature. He states he has a dry nonproductive cough without any nausea or vomiting patient is not aware of any fever at home. He denies any calf tenderness or swelling. He states he feels almost like he's having anxiety attacks whenever or shortness of breath gets worse and describes some numbness and tingling in his feet and hands along with feeling lightheaded. He denies any actual syncopal episode.   Past Medical History  Diagnosis Date  . COPD (chronic obstructive pulmonary disease) ([place])   . Hypertension   . Emphysema lung ([place])   . Colitis    patient states a history of coronary artery disease and states he has one vessel that to obstructed has been managed medically  There are no active problems to display for this patient.   Past Surgical History  Procedure Laterality Date  . Abdominal hernia repair Bilateral     Past Surgical History  Procedure Laterality Date  . Abdominal hernia repair Bilateral     Current Outpatient Rx  Name  Route  Sig  Dispense  Refill  . amitriptyline (ELAVIL) 10 MG tablet   Oral   Take 30 mg by mouth at bedtime.         . budesonide-formoterol (SYMBICORT) 160-4.5 MCG/ACT inhaler   Inhalation   Inhale 2 puffs into the lungs 2 (two) times daily.         Marland Kitchen gabapentin (NEURONTIN) 300 MG capsule   Oral   Take 300 mg by mouth 3 (three) times daily.         . isosorbide mononitrate (IMDUR) 60  MG 24 hr tablet   Oral   Take 60 mg by mouth daily.         . metoprolol (LOPRESSOR) 50 MG tablet   Oral   Take 100 mg by mouth 2 (two) times daily.         Marland Kitchen OMEPRAZOLE PO   Oral   Take 1 capsule by mouth daily.           Allergies:  Zantac; Codeine; Erythromycin; and Vicodin  Family History: History reviewed. No pertinent family history.  Social History: Social History  Substance Use Topics  . Smoking status: Former Research scientist (life sciences)  . Smokeless tobacco: Never Used  . Alcohol Use: No     Review of Systems:   10 point review of systems was performed and was otherwise negative:  Constitutional: No fever Eyes: No visual disturbances ENT: No sore throat, ear pain Cardiac: No chest pain Respiratory: Shortness of breath with wheezing at home,no stridor Abdomen: No abdominal pain, no vomiting, No diarrhea Endocrine: No weight loss, No night sweats Extremities: No peripheral edema, cyanosis Skin: No rashes, easy bruising Neurologic: No focal weakness, trouble with speech or swollowing Urologic: No dysuria, Hematuria, or urinary frequency   Physical Exam:  ED Triage Vitals  Enc Vitals Group     BP 12/14/15 2121 154/94 mmHg  Pulse Rate 12/14/15 2121 73     Resp 12/14/15 2130 18     Temp 12/14/15 2121 98.1 F (36.7 C)     Temp Source 12/14/15 2121 Oral     SpO2 12/14/15 2121 100 %     Weight 12/14/15 2121 211 lb 1.6 oz (95.754 kg)     Height --      Head Cir --      Peak Flow --      Pain Score 12/14/15 2128 4     Pain Loc --      Pain Edu? --      Excl. in [place]? --     General: Awake , Alert , and Oriented times 3; GCS 15 patient speaks in interrupted sentences Head: Normal cephalic , atraumatic Eyes: Pupils equal , round, reactive to light Nose/Throat: No nasal drainage, patent upper airway without erythema or exudate.  Neck: Supple, Full range of motion, No anterior adenopathy or palpable thyroid masses Lungs: Limited air movement at the bases without  wheezes rhonchi's or Rales Heart: Regular rate, regular rhythm without murmurs , gallops , or rubs Abdomen: Soft, non tender without rebound, guarding , or rigidity; bowel sounds positive and symmetric in all 4 quadrants. No organomegaly .        Extremities: 2 plus symmetric pulses. No edema, clubbing or cyanosis Neurologic: normal ambulation, Motor symmetric without deficits, sensory intact Skin: warm, dry, no rashes areas of psoriasis   Labs:   All laboratory work was reviewed including any pertinent negatives or positives listed below:  Labs Reviewed  BASIC METABOLIC PANEL - Abnormal; Notable for the following:    Chloride 100 (*)    Glucose, Bld 176 (*)    All other components within normal limits  CBC - Abnormal; Notable for the following:    RDW 17.2 (*)    All other components within normal limits  TROPONIN I - Abnormal; Notable for the following:    Troponin I 0.07 (*)    All other components within normal limits    EKG:  ED ECG REPORT I, Daymon Larsen, the attending physician, personally viewed and interpreted this ECG.  Date: 12/15/2015 EKG Time: 2127 Rate: 69 Rhythm: normal sinus rhythm QRS Axis: normal Intervals: normal ST/T Wave abnormalities: ST-T depression nonspecific T wave abnormalities seen diffusely in all leads especially in the anterior leads Conduction Disutrbances: none Narrative Interpretation: unremarkable Previous EKG on 02-04-2013 shows similar findings No obvious acute ischemic changes  Radiology: *    I personally reviewed the radiologic studiesCLINICAL DATA: 64 year old male with chest pain and pressure.  EXAM: CHEST 2 VIEW  COMPARISON: Chest radiograph and CT dated 02/04/2013  FINDINGS: Two views of the chest demonstrate emphysematous changes of the lungs with bibasilar atelectasis/scarring. No focal consolidation, pleural effusion, or pneumothorax. Top-normal cardiac silhouette. The osseous structures appear  unremarkable.  IMPRESSION: Emphysema. No focal consolidation or pneumothorax.      ED Course:  Patient's stay here was uneventful and review of his laboratory work shows an increased troponin. His EKG seems to have persistent findings and no obvious acute ischemic changes were noted. States he still has some right-sided chest discomfort though his breathing has improved. Patient had nitroglycerin paste applied and had a full dosage of aspirin here in emergency department. His shortness of breath is most likely from emphysema. Patient received IV Solu-Medrol and had 2 breathing treatments prior to my assessment.  Assessment: Acute unspecified chest pain Emphysema   Final Clinical Impression:  Final diagnoses:  Chest pain, unspecified chest pain type     Plan: * Inpatient management I spoke to the hospitalist team, further disposition and management depends upon their evaluation           Daymon Larsen, MD 12/15/15 [phone]

## 2015-12-15 NOTE — ED Notes (Signed)
Pt transported to the floor by Tammy, Medic.

## 2015-12-15 NOTE — Care Management (Signed)
Admitted to [facility] under observation status for the diagnosis of COPD. Lives with wife, [name] [phone]). Goes to [facility] his care. States he was last at [facility] about a month ago. Chronic oxygen thru [place] x 6 months. Uses home oxygen at 2liters per nasal cannula as needed. No home health. Worked at [employer] x 20 years. Takes care of both basic and instrumental activities of daily living himself, drives. Wife will transport him home when discharged Shelbie Ammons RN MSN [facility] [phone]

## 2015-12-15 NOTE — Discharge Summary (Signed)
[place] [facility] [place] NAME: Darius Norris    MR#:  [id number]  DATE OF BIRTH:  August 18, 1951  DATE OF ADMISSION:  12/14/2015 ADMITTING PHYSICIAN: Lance Coon, MD  DATE OF DISCHARGE: 12/15/2015 PRIMARY CARE PHYSICIAN: No primary care provider on file.    ADMISSION DIAGNOSIS:  Chest pain, unspecified chest pain type [R07.9]  DISCHARGE DIAGNOSIS:  COPD exacerbation Chest pain noncardiac  SECONDARY DIAGNOSIS:   Past Medical History  Diagnosis Date  . COPD (chronic obstructive pulmonary disease) ([place])   . Hypertension   . Emphysema lung ([place])   . Colitis   . GERD (gastroesophageal reflux disease)   . CAD (coronary artery disease)     HOSPITAL COURSE:  Principal Problem:  COPD exacerbation ([place]) -  pt is clinically improved after IV solumedrol and nebs .  Will discharge patient home with by mouth steroids and inhalers   Active Problems:  Elevated troponin -ruled out acute MI, possibly due to some mild demand ischemia due to mild hypoxia Consulted cardiology with his known CAD, Dr. Nehemiah Massed has recommended outpatient follow-up with Dr. Saralyn Pilar  in a week for outpatient echocardiogram and possible stress test   Angina pectoris ([place]) - improved currently, ruled out acute MI   HTN (hypertension) - elevated , patient is to continue Imdur, metoprolol and vasotec is added to the regimen    CAD (coronary artery disease) - continue home meds, and further workup as warranted as above   GERD (gastroesophageal reflux disease) - home dose PPI   DISCHARGE CONDITIONS:   fair  CONSULTS OBTAINED:  Treatment Team:  Corey Skains, MD   PROCEDURES none  DRUG ALLERGIES:   Allergies  Allergen Reactions  . Zantac [Ranitidine Hcl] Shortness Of Breath  . Codeine Itching  . Erythromycin Hives  . Vicodin [Hydrocodone-Acetaminophen] Rash    DISCHARGE MEDICATIONS:   Current Discharge Medication List    START taking  these medications   Details  albuterol (PROVENTIL HFA;VENTOLIN HFA) 108 (90 Base) MCG/ACT inhaler Inhale 2 puffs into the lungs every 6 (six) hours as needed for wheezing or shortness of breath. Qty: 1 Inhaler, Refills: 0    enalapril (VASOTEC) 2.5 MG tablet Take 1 tablet (2.5 mg total) by mouth daily. Qty: 30 tablet, Refills: 0    !! metoprolol tartrate (LOPRESSOR) 25 MG tablet Take 1 tablet (25 mg total) by mouth once. Qty: 1 tablet, Refills: 0    predniSONE (STERAPRED UNI-PAK 21 TAB) 10 MG (21) TBPK tablet Take 1 tablet (10 mg total) by mouth daily. Take 6 tablets by mouth for 1 day followed by  5 tablets by mouth for 1 day followed by  4 tablets by mouth for 1 day followed by  3 tablets by mouth for 1 day followed by  2 tablets by mouth for 1 day followed by  1 tablet by mouth for a day and stop Qty: 21 tablet, Refills: 0     !! - Potential duplicate medications found. Please discuss with provider.    CONTINUE these medications which have NOT CHANGED   Details  amitriptyline (ELAVIL) 10 MG tablet Take 30 mg by mouth at bedtime.    budesonide-formoterol (SYMBICORT) 160-4.5 MCG/ACT inhaler Inhale 2 puffs into the lungs 2 (two) times daily.    gabapentin (NEURONTIN) 300 MG capsule Take 300 mg by mouth 3 (three) times daily.    Ipratropium-Albuterol (COMBIVENT RESPIMAT) 20-100 MCG/ACT AERS respimat Inhale 1 puff into the lungs every 6 (six) hours  as needed for wheezing or shortness of breath.    isosorbide mononitrate (IMDUR) 60 MG 24 hr tablet Take 60 mg by mouth daily.    !! metoprolol (LOPRESSOR) 50 MG tablet Take 100 mg by mouth 2 (two) times daily.    OMEPRAZOLE PO Take 1 capsule by mouth daily.     !! - Potential duplicate medications found. Please discuss with provider.       DISCHARGE INSTRUCTIONS:   Activity as tolerated Diet heart healthy  Continue oxygen 2 L  via nasal cannula as needed  Follow-up with primary care physician in a week  Follow-up with  cardiology Dr. Nehemiah Massed in a week for outpatient stress test and echocardiogram    DIET:  Cardiac diet  DISCHARGE CONDITION:  Fair  ACTIVITY:  Activity as tolerated with 2 L of oxygen via nasal cannula as needed  OXYGEN:  Home Oxygen: Yes.     Oxygen Delivery: 2 liters/min via Patient connected to nasal cannula oxygen prn  DISCHARGE LOCATION:  home   If you experience worsening of your admission symptoms, develop shortness of breath, life threatening emergency, suicidal or homicidal thoughts you must seek medical attention immediately by calling 911 or calling your MD immediately  if symptoms less severe.  You Must read complete instructions/literature along with all the possible adverse reactions/side effects for all the Medicines you take and that have been prescribed to you. Take any new Medicines after you have completely understood and accpet all the possible adverse reactions/side effects.   Please note  You were cared for by a hospitalist during your hospital stay. If you have any questions about your discharge medications or the care you received while you were in the hospital after you are discharged, you can call the unit and asked to speak with the hospitalist on call if the hospitalist that took care of you is not available. Once you are discharged, your primary care physician will handle any further medical issues. Please note that NO REFILLS for any discharge medications will be authorized once you are discharged, as it is imperative that you return to your primary care physician (or establish a relationship with a primary care physician if you do not have one) for your aftercare needs so that they can reassess your need for medications and monitor your lab values.     Today  Chief Complaint  Patient presents with  . Shortness of Breath  . Chest Pain   Patient denies any chest pain or shortness of breath, feeling much better.Adamant to eat. He prefers following up  with cardiology as an outpatient and doesn't want any procedures while he is in the hospital Discussed with cardiology Dr. Nehemiah Massed who has recommended outpatient follow-up in a week, okay to discharge from this standpoint as he ruled out acute MI  ROS: CONSTITUTIONAL: Denies fevers, chills. Denies any fatigue, weakness.  EYES: Denies blurry vision, double vision, eye pain. EARS, NOSE, THROAT: Denies tinnitus, ear pain, hearing loss. RESPIRATORY: Denies cough, wheeze, shortness of breath.  CARDIOVASCULAR: Denies chest pain, palpitations, edema.  GASTROINTESTINAL: Denies nausea, vomiting, diarrhea, abdominal pain. Denies bright red blood per rectum. GENITOURINARY: Denies dysuria, hematuria. ENDOCRINE: Denies nocturia or thyroid problems. HEMATOLOGIC AND LYMPHATIC: Denies easy bruising or bleeding. SKIN: Denies rash or lesion. MUSCULOSKELETAL: Denies pain in neck, back, shoulder, knees, hips or arthritic symptoms.  NEUROLOGIC: Denies paralysis, paresthesias.  PSYCHIATRIC: Denies anxiety or depressive symptoms.   VITAL SIGNS:  Blood pressure 152/79, pulse 76, temperature 97.6 F (36.4 C),  temperature source Oral, resp. rate 19, height 5\' 6"  (1.676 m), weight 95.165 kg (209 lb 12.8 oz), SpO2 92 %.  I/O:    Intake/Output Summary (Last 24 hours) at 12/15/15 1608 Last data filed at 12/15/15 1300  Gross per 24 hour  Intake    240 ml  Output      0 ml  Net    240 ml    PHYSICAL EXAMINATION:  GENERAL:  64 y.o.-year-old patient lying in the bed with no acute distress.  EYES: Pupils equal, round, reactive to light and accommodation. No scleral icterus. Extraocular muscles intact.  HEENT: Head atraumatic, normocephalic. Oropharynx and nasopharynx clear.  NECK:  Supple, no jugular venous distention. No thyroid enlargement, no tenderness.  LUNGS: Normal breath sounds bilaterally, no wheezing, rales,rhonchi or crepitation. No use of accessory muscles of respiration.  CARDIOVASCULAR: S1, S2  normal. No murmurs, rubs, or gallops.  ABDOMEN: Soft, non-tender, non-distended. Bowel sounds present. No organomegaly or mass.  EXTREMITIES: No pedal edema, cyanosis, or clubbing.  NEUROLOGIC: Cranial nerves II through XII are intact. Muscle strength 5/5 in all extremities. Sensation intact. Gait not checked.  PSYCHIATRIC: The patient is alert and oriented x 3.  SKIN: No obvious rash, lesion, or ulcer.   DATA REVIEW:   CBC  Recent Labs Lab 12/15/15 0240  WBC 8.7  HGB 16.0  HCT 49.3  PLT 204    Chemistries   Recent Labs Lab 12/15/15 0240  NA 136  K 4.3  CL 99*  CO2 28  GLUCOSE 156*  BUN 10  CREATININE 1.01  CALCIUM 9.2    Cardiac Enzymes  Recent Labs Lab 12/15/15 1101  TROPONINI <0.03    Microbiology Results  No results found for this or any previous visit.  RADIOLOGY:  Dg Chest 2 View  12/14/2015  CLINICAL DATA:  64 year old male with chest pain and pressure. EXAM: CHEST  2 VIEW COMPARISON:  Chest radiograph and CT dated 02/04/2013 FINDINGS: Two views of the chest demonstrate emphysematous changes of the lungs with bibasilar atelectasis/scarring. No focal consolidation, pleural effusion, or pneumothorax. Top-normal cardiac silhouette. The osseous structures appear unremarkable. IMPRESSION: Emphysema.  No focal consolidation or pneumothorax. Electronically Signed   By: Anner Crete M.D.   On: 12/14/2015 22:22    EKG:   Orders placed or performed during the hospital encounter of 12/14/15  . EKG 12-Lead  . EKG 12-Lead  . ED EKG within 10 minutes  . ED EKG within 10 minutes      Management plans discussed with the patient, family and they are in agreement.  CODE STATUS:     Code Status Orders        Start     Ordered   12/15/15 0159  Full code   Continuous     12/15/15 0158      TOTAL TIME TAKING CARE OF THIS PATIENT: 45  minutes.    @[facility] @  on 12/15/2015 at 4:08 PM  Between 7am to 6pm - Pager - [phone]  After 6pm go to  [email or web address] - password [email or web address] [facility]  [facility] [facility]  Office  [phone]  CC: Primary care physician; No primary care provider on file.

## 2015-12-15 NOTE — ED Notes (Signed)
Admitting at bedside 

## 2015-12-15 NOTE — Discharge Instructions (Signed)
Activity as tolerated Diet heart healthy  Continue oxygen 2 L  via nasal cannula as needed  Follow-up with primary care physician in a week  Follow-up with cardiology Dr. Nehemiah Massed in a week for outpatient stress test and echocardiogram  Chest Wall Pain Chest wall pain is pain in or around the bones and muscles of your chest. Sometimes, an injury causes this pain. Sometimes, the cause may not be known. This pain may take several weeks or longer to get better. HOME CARE INSTRUCTIONS  Pay attention to any changes in your symptoms. Take these actions to help with your pain:   Rest as told by your health care provider.   Avoid activities that cause pain. These include any activities that use your chest muscles or your abdominal and side muscles to lift heavy items.   If directed, apply ice to the painful area:  Put ice in a plastic bag.  Place a towel between your skin and the bag.  Leave the ice on for 20 minutes, 2-3 times per day.  Take over-the-counter and prescription medicines only as told by your health care provider.  Do not use tobacco products, including cigarettes, chewing tobacco, and e-cigarettes. If you need help quitting, ask your health care provider.  Keep all follow-up visits as told by your health care provider. This is important. SEEK MEDICAL CARE IF:  You have a fever.  Your chest pain becomes worse.  You have new symptoms. SEEK IMMEDIATE MEDICAL CARE IF:  You have nausea or vomiting.  You feel sweaty or light-headed.  You have a cough with phlegm (sputum) or you cough up blood.  You develop shortness of breath.   This information is not intended to replace advice given to you by your health care provider. Make sure you discuss any questions you have with your health care provider.   Document Released: 12/04/2005 Document Revised: 08/25/2015 Document Reviewed: 03/01/2015 Elsevier Interactive Patient Education [employer].

## 2015-12-15 NOTE — Consult Note (Signed)
[facility] Consultation Note  Patient ID: Darius Norris, MRN: [id number], DOB/AGE: October 05, 1951 64 y.o. Admit date: 12/14/2015   Date of Consult: 12/15/2015 Primary Physician: No primary care provider on file. Primary Cardiologist: Sim Boast [facility]  Chief Complaint:  Chief Complaint  Patient presents with  . Shortness of Breath  . Chest Pain   Reason for Consult: chest pain and shortness of breath with elevated troponin  HPI: 64 y.o. male with known coronary artery disease essential hypertension and mixed hyperlipidemia with COPD and lung disease status post previous history of tobacco abuse. The patient has had recent issues with progressive shortness of breath and some chest pressure as well waxing and waning over several day. Culminating in needing to be seen in the emergency room. At that time the patient has had an EKG showing normal sinus rhythm with anterior precordial T-wave inversions although this is unchanged from previous EKG. The patient had a minimal elevation of troponin but no evidence of acute coronary syndrome. Patient has had significant improvements of symptoms with no evidence of further chest discomfort and only minimal support breath. Darius Norris has been appropriately on medication management for risk reduction and also on isosorbide which has helped.  Past Medical History  Diagnosis Date  . COPD (chronic obstructive pulmonary disease) ([place])   . Hypertension   . Emphysema lung ([place])   . Colitis   . GERD (gastroesophageal reflux disease)   . CAD (coronary artery disease)       Surgical History:  Past Surgical History  Procedure Laterality Date  . Abdominal hernia repair Bilateral      Home Meds: Prior to Admission medications   Medication Sig Start Date End Date Taking? Authorizing Provider  amitriptyline (ELAVIL) 10 MG tablet Take 30 mg by mouth at bedtime.   Yes Historical Provider, MD  budesonide-formoterol (SYMBICORT) 160-4.5 MCG/ACT inhaler Inhale  2 puffs into the lungs 2 (two) times daily.   Yes Historical Provider, MD  gabapentin (NEURONTIN) 300 MG capsule Take 300 mg by mouth 3 (three) times daily.   Yes Historical Provider, MD  Ipratropium-Albuterol (COMBIVENT RESPIMAT) 20-100 MCG/ACT AERS respimat Inhale 1 puff into the lungs every 6 (six) hours as needed for wheezing or shortness of breath.   Yes Historical Provider, MD  isosorbide mononitrate (IMDUR) 60 MG 24 hr tablet Take 60 mg by mouth daily.   Yes Historical Provider, MD  metoprolol (LOPRESSOR) 50 MG tablet Take 100 mg by mouth 2 (two) times daily.   Yes Historical Provider, MD  OMEPRAZOLE PO Take 1 capsule by mouth daily.   Yes Historical Provider, MD    Inpatient Medications:  . budesonide-formoterol  2 puff Inhalation BID  . enoxaparin (LOVENOX) injection  40 mg Subcutaneous Q24H  . gabapentin  300 mg Oral TID  . isosorbide mononitrate  60 mg Oral Daily  . methylPREDNISolone (SOLU-MEDROL) injection  60 mg Intravenous Q12H  . metoprolol  100 mg Oral BID  . metoprolol tartrate  25 mg Oral Once  . pantoprazole  40 mg Oral Daily  . sodium chloride  3 mL Intravenous Q12H      Allergies:  Allergies  Allergen Reactions  . Zantac [Ranitidine Hcl] Shortness Of Breath  . Codeine Itching  . Erythromycin Hives  . Vicodin [Hydrocodone-Acetaminophen] Rash    Social History   Social History  . Marital Status: Married    Spouse Name: N/A  . Number of Children: N/A  . Years of Education: N/A   Occupational History  .  Not on file.   Social History Main Topics  . Smoking status: Former Research scientist (life sciences)  . Smokeless tobacco: Never Used  . Alcohol Use: No  . Drug Use: Not on file  . Sexual Activity: Not on file   Other Topics Concern  . Not on file   Social History Narrative     History reviewed. No pertinent family history.   Review of Systems Positive for redness of breath and chest pain Negative for: General:  chills, fever, night sweats or weight changes.   Cardiovascular: PND orthopnea syncope dizziness  Dermatological skin lesions rashes Respiratory: Cough congestion Urologic: Frequent urination urination at night and hematuria Abdominal: negative for nausea, vomiting, diarrhea, bright red blood per rectum, melena, or hematemesis Neurologic: negative for visual changes, and/or hearing changes  All other systems reviewed and are otherwise negative except as noted above.  Labs:  Recent Labs  12/14/15 2134 12/15/15 0240 12/15/15 1101  TROPONINI 0.07* <0.03 <0.03   Lab Results  Component Value Date   WBC 8.7 12/15/2015   HGB 16.0 12/15/2015   HCT 49.3 12/15/2015   MCV 87.0 12/15/2015   PLT 204 12/15/2015    Recent Labs Lab 12/15/15 0240  NA 136  K 4.3  CL 99*  CO2 28  BUN 10  CREATININE 1.01  CALCIUM 9.2  GLUCOSE 156*   Lab Results  Component Value Date   CHOL 185 02/04/2013   HDL 38* 02/04/2013   LDLCALC 114* 02/04/2013   TRIG 166 02/04/2013   No results found for: DDIMER  Radiology/Studies:  Dg Chest 2 View  12/14/2015  CLINICAL DATA:  64 year old male with chest pain and pressure. EXAM: CHEST  2 VIEW COMPARISON:  Chest radiograph and CT dated 02/04/2013 FINDINGS: Two views of the chest demonstrate emphysematous changes of the lungs with bibasilar atelectasis/scarring. No focal consolidation, pleural effusion, or pneumothorax. Top-normal cardiac silhouette. The osseous structures appear unremarkable. IMPRESSION: Emphysema.  No focal consolidation or pneumothorax. Electronically Signed   By: Anner Crete M.D.   On: 12/14/2015 22:22    EKG: Normal sinus rhythm with anterior T-wave inversion and ST changes consistent with possible previous myocardial infarction  Weights: Filed Weights   12/14/15 2121 12/15/15 0208  Weight: 211 lb 1.6 oz (95.754 kg) 209 lb 12.8 oz (95.165 kg)     Physical Exam: Blood pressure 158/78, pulse 76, temperature 97.6 F (36.4 C), temperature source Oral, resp. rate 19, height 5'  6" (1.676 m), weight 209 lb 12.8 oz (95.165 kg), SpO2 96 %. Body mass index is 33.88 kg/(m^2). General: Well developed, well nourished, in no acute distress. Head eyes ears nose throat: Normocephalic, atraumatic, sclera non-icteric, no xanthomas, nares are without discharge. No apparent thyromegaly and/or mass  Lungs: Normal respiratory effort. Diffuse wheezes, no rales, no rhonchi.  Heart: RRR with normal S1 S2. no murmur gallop, no rub, PMI is normal size and placement, carotid upstroke normal without bruit, jugular venous pressure is normal Abdomen: Soft, non-tender, non-distended with normoactive bowel sounds. No hepatomegaly. No rebound/guarding. No obvious abdominal masses. Abdominal aorta is normal size  Extremities: No edema. no cyanosis, no clubbing, no ulcers  Peripheral : 2+ bilateral upper extremity pulses, 2+ bilateral femoral pulses, 2+ bilateral dorsal pedal pulse Neuro: Alert and oriented. No facial asymmetry. No focal deficit. Moves all extremities spontaneously. Musculoskeletal: Normal muscle tone without kyphosis Psych:  Responds to questions appropriately with a normal affect.    Assessment: 64 year old male with essential hypertension mixed hyperlipidemia chronic obstructive pulmonary disease coronary artery  disease with an abnormal EKG and minimal elevation of troponin consistent with demand ischemia and no current evidence of myocardial infarction  Plan: 1. Continue isosorbide for intermittent chest discomfort and coronary artery disease 2. Metoprolol hypertension control and coronary artery disease X 3. Continue treatment of COPD 4. Begin ambulation following for further significant symptoms and possible discharge to home with outpatient follow-up and stress test if needed  Signed, Corey Skains M.D. [facility] 12/15/2015, 1:28 PM

## 2015-12-15 NOTE — ED Notes (Signed)
Pt ambulated to the bedside commode to void. Pt returned to the bed with the help of [employer].

## 2015-12-15 NOTE — ED Notes (Signed)
EDT Mayra notified to transport pt to the floor, states she will.

## 2015-12-15 NOTE — H&P (Signed)
[place] [facility] [place] NAME: Darius Norris    MR#:  [id number]  DATE OF BIRTH:  1951/05/11  DATE OF ADMISSION:  12/14/2015  PRIMARY CARE PHYSICIAN: No primary care provider on file.   REQUESTING/REFERRING PHYSICIAN: Marcelene Butte, MD  CHIEF COMPLAINT:   Chief Complaint  Patient presents with  . Shortness of Breath  . Chest Pain    HISTORY OF PRESENT ILLNESS:  Darius Norris  is a 64 y.o. male who presents with episode of shortness of breath followed by chest pain.  Patient states that he has COPD and uses home oxygen when he exerts himself.  Today he had an episode where he became acutely short of breath and his home albuterol nebulizer was not helping him improve.  He called EMS and they gave him a duoneb which helped ease his respiratory distress.  He opted not to come to the ED at that time.  A couple of hours later he developed chest pain, anterior, pressure-like, without radiation, diaphoresis, nausea/vomiting/abd pain.  He then decided to come for evaluation.  Here he is satting well on O2 via [place], and his breathing is improved some after IV solumedrol.  His troponin was elevated at 0.07.  This is in the setting of known CAD, and stable but abnormal EKG, with prior troponins in our records having been negative in the past.  Hospitalists were called for admission.  PAST MEDICAL HISTORY:   Past Medical History  Diagnosis Date  . COPD (chronic obstructive pulmonary disease) ([place])   . Hypertension   . Emphysema lung ([place])   . Colitis   . GERD (gastroesophageal reflux disease)   . CAD (coronary artery disease)     PAST SURGICAL HISTORY:   Past Surgical History  Procedure Laterality Date  . Abdominal hernia repair Bilateral     SOCIAL HISTORY:   Social History  Substance Use Topics  . Smoking status: Former Research scientist (life sciences)  . Smokeless tobacco: Never Used  . Alcohol Use: No    FAMILY HISTORY:  History reviewed. No pertinent family  history.  DRUG ALLERGIES:   Allergies  Allergen Reactions  . Zantac [Ranitidine Hcl] Shortness Of Breath  . Codeine Itching  . Erythromycin Hives  . Vicodin [Hydrocodone-Acetaminophen] Rash    MEDICATIONS AT HOME:   Prior to Admission medications   Medication Sig Start Date End Date Taking? Authorizing Provider  amitriptyline (ELAVIL) 10 MG tablet Take 30 mg by mouth at bedtime.   Yes Historical Provider, MD  budesonide-formoterol (SYMBICORT) 160-4.5 MCG/ACT inhaler Inhale 2 puffs into the lungs 2 (two) times daily.   Yes Historical Provider, MD  gabapentin (NEURONTIN) 300 MG capsule Take 300 mg by mouth 3 (three) times daily.   Yes Historical Provider, MD  isosorbide mononitrate (IMDUR) 60 MG 24 hr tablet Take 60 mg by mouth daily.   Yes Historical Provider, MD  metoprolol (LOPRESSOR) 50 MG tablet Take 100 mg by mouth 2 (two) times daily.   Yes Historical Provider, MD  OMEPRAZOLE PO Take 1 capsule by mouth daily.   Yes Historical Provider, MD    REVIEW OF SYSTEMS:  Review of Systems  Constitutional: Negative for fever, chills, weight loss and malaise/fatigue.  HENT: Negative for ear pain, hearing loss and tinnitus.   Eyes: Negative for blurred vision, double vision, pain and redness.  Respiratory: Positive for shortness of breath. Negative for cough and hemoptysis.   Cardiovascular: Positive for chest pain. Negative for palpitations, orthopnea and leg swelling.  Gastrointestinal: Negative for nausea, vomiting, abdominal pain, diarrhea and constipation.  Genitourinary: Negative for dysuria, frequency and hematuria.  Musculoskeletal: Negative for back pain, joint pain and neck pain.  Skin:       No acne, rash, or lesions  Neurological: Negative for dizziness, tremors, focal weakness and weakness.  Endo/Heme/Allergies: Negative for polydipsia. Does not bruise/bleed easily.  Psychiatric/Behavioral: Negative for depression. The patient is not nervous/anxious and does not have  insomnia.      VITAL SIGNS:   Filed Vitals:   12/14/15 2315 12/14/15 2319 12/14/15 2330 12/15/15 0030  BP:  150/87 146/85 164/96  Pulse: 65 64 64 70  Temp:      TempSrc:      Resp: 19 18 20 17   Weight:      SpO2: 97% 99% 100% 98%   Wt Readings from Last 3 Encounters:  12/14/15 95.754 kg (211 lb 1.6 oz)  05/19/15 93.441 kg (206 lb)    PHYSICAL EXAMINATION:  Physical Exam  Vitals reviewed. Constitutional: He is oriented to person, place, and time. He appears well-developed and well-nourished. No distress.  HENT:  Head: Normocephalic and atraumatic.  Mouth/Throat: Oropharynx is clear and moist.  Eyes: Conjunctivae and EOM are normal. Pupils are equal, round, and reactive to light. No scleral icterus.  Neck: Normal range of motion. Neck supple. No JVD present. No thyromegaly present.  Cardiovascular: Normal rate, regular rhythm and intact distal pulses.  Exam reveals no gallop and no friction rub.   No murmur heard. Respiratory: Effort normal and breath sounds normal. No respiratory distress. He has no wheezes. He has no rales.  GI: Soft. Bowel sounds are normal. He exhibits no distension. There is no tenderness.  Musculoskeletal: Normal range of motion. He exhibits no edema.  No arthritis, no gout  Lymphadenopathy:    He has no cervical adenopathy.  Neurological: He is alert and oriented to person, place, and time. No cranial nerve deficit.  No dysarthria, no aphasia  Skin: Skin is warm and dry. No rash noted. No erythema.  Psychiatric: He has a normal mood and affect. His behavior is normal. Judgment and thought content normal.    LABORATORY PANEL:   CBC  Recent Labs Lab 12/14/15 2134  WBC 8.4  HGB 15.2  HCT 46.5  PLT 195   ------------------------------------------------------------------------------------------------------------------  Chemistries   Recent Labs Lab 12/14/15 2134  NA 138  K 3.6  CL 100*  CO2 29  GLUCOSE 176*  BUN 10  CREATININE 1.01   CALCIUM 9.0   ------------------------------------------------------------------------------------------------------------------  Cardiac Enzymes  Recent Labs Lab 12/14/15 2134  TROPONINI 0.07*   ------------------------------------------------------------------------------------------------------------------  RADIOLOGY:  Dg Chest 2 View  12/14/2015  CLINICAL DATA:  64 year old male with chest pain and pressure. EXAM: CHEST  2 VIEW COMPARISON:  Chest radiograph and CT dated 02/04/2013 FINDINGS: Two views of the chest demonstrate emphysematous changes of the lungs with bibasilar atelectasis/scarring. No focal consolidation, pleural effusion, or pneumothorax. Top-normal cardiac silhouette. The osseous structures appear unremarkable. IMPRESSION: Emphysema.  No focal consolidation or pneumothorax. Electronically Signed   By: Anner Crete M.D.   On: 12/14/2015 22:22    EKG:   Orders placed or performed during the hospital encounter of 12/14/15  . EKG 12-Lead  . EKG 12-Lead  . ED EKG within 10 minutes  . ED EKG within 10 minutes    IMPRESSION AND PLAN:  Principal Problem:   COPD exacerbation ([place]) - pt is much improved after IV solumedrol and nebs here.  Will continue  this while he remains admitted, then likely needs steroid taper when he is discharged. Active Problems:   Elevated troponin - possibly due to some mild demand ischemia due to mild hypoxia during his respiratory episode.  However, with his known CAD, we will trend his troponin serially and have Cardiology see him.   Angina pectoris ([place]) - much improved currently, likely related to the above issues.   HTN (hypertension) - stable, continue home meds   CAD (coronary artery disease) - continue home meds, and further workup as warranted as above   GERD (gastroesophageal reflux disease) - home dose PPI  All the records are reviewed and case discussed with ED provider. Management plans discussed with the patient and/or  family.  DVT PROPHYLAXIS: SubQ lovenox  GI PROPHYLAXIS: PPI  ADMISSION STATUS: Observation  CODE STATUS: Full  TOTAL TIME TAKING CARE OF THIS PATIENT: 45 minutes.    Rorey Bisson FIELDING 12/15/2015, 12:57 AM  [facility]  Office  [phone]  CC: Primary care physician; No primary care provider on file.

## 2015-12-15 NOTE — Care Management Obs Status (Signed)
[facility] NOTIFICATION   Patient Details  Name: Darius Norris MRN: [id number] Date of Birth: October 18, 1951   Medicare Observation Status Notification Given:  Yes    Shelbie Ammons, RN 12/15/2015, 10:19 AM

## 2015-12-15 NOTE — Progress Notes (Signed)
Called Dr Margaretmary Eddy, per telemetry clerk patient HR 76 with depressed T, MD acknowledged no new orders

## 2015-12-15 NOTE — Progress Notes (Signed)
Patient c/o headache x1 relieved well with prn Tylenol  Ambulated patient around unit twice o2 sat dropped to 92 and recovered to mid 90's at rest  VSS except BP in 160's MD aware one time dose to 25mg  Metoprolol gived Received MD order to discharge patient to home discharge instructions prescriptions and home meds reviewed with patient and patient verbalized understanding discharged to home in wheelchair with axillary

## 2016-04-16 ENCOUNTER — Emergency Department
Admission: EM | Admit: 2016-04-16 | Discharge: 2016-04-16 | Disposition: A | Discharge status: Home / Self Care | Payer: Medicare Other | Attending: Emergency Medicine | Admitting: Emergency Medicine

## 2016-04-16 ENCOUNTER — Emergency Department: Payer: Medicare Other

## 2016-04-16 ENCOUNTER — Encounter: Payer: Self-pay | Admitting: Emergency Medicine

## 2016-04-16 DIAGNOSIS — Z87891 Personal history of nicotine dependence: Secondary | ICD-10-CM | POA: Diagnosis not present

## 2016-04-16 DIAGNOSIS — J441 Chronic obstructive pulmonary disease with (acute) exacerbation: Secondary | ICD-10-CM | POA: Diagnosis not present

## 2016-04-16 DIAGNOSIS — Z7951 Long term (current) use of inhaled steroids: Secondary | ICD-10-CM | POA: Insufficient documentation

## 2016-04-16 DIAGNOSIS — I10 Essential (primary) hypertension: Secondary | ICD-10-CM | POA: Insufficient documentation

## 2016-04-16 DIAGNOSIS — Z79899 Other long term (current) drug therapy: Secondary | ICD-10-CM | POA: Insufficient documentation

## 2016-04-16 DIAGNOSIS — R0600 Dyspnea, unspecified: Secondary | ICD-10-CM | POA: Diagnosis present

## 2016-04-16 DIAGNOSIS — I25119 Atherosclerotic heart disease of native coronary artery with unspecified angina pectoris: Secondary | ICD-10-CM | POA: Diagnosis not present

## 2016-04-16 LAB — CBC WITH DIFFERENTIAL/PLATELET
BASOS ABS: 0.4 10*3/uL — AB (ref 0–0.1)
BASOS PCT: 4 %
EOS ABS: 0.2 10*3/uL (ref 0–0.7)
Eosinophils Relative: 2 %
HCT: 46.1 % (ref 40.0–52.0)
HEMOGLOBIN: 15.4 g/dL (ref 13.0–18.0)
LYMPHS PCT: 10 %
Lymphs Abs: 1.1 10*3/uL (ref 1.0–3.6)
MCH: 28.8 pg (ref 26.0–34.0)
MCHC: 33.5 g/dL (ref 32.0–36.0)
MCV: 86.1 fL (ref 80.0–100.0)
MONO ABS: 0.5 10*3/uL (ref 0.2–1.0)
Monocytes Relative: 5 %
NEUTROS PCT: 79 %
Neutro Abs: 8.4 10*3/uL — ABNORMAL HIGH (ref 1.4–6.5)
PLATELETS: 213 10*3/uL (ref 150–440)
RBC: 5.36 MIL/uL (ref 4.40–5.90)
RDW: 16.5 % — ABNORMAL HIGH (ref 11.5–14.5)
WBC: 10.6 10*3/uL (ref 3.8–10.6)

## 2016-04-16 LAB — BASIC METABOLIC PANEL
Anion gap: 10 (ref 5–15)
BUN: 9 mg/dL (ref 6–20)
CHLORIDE: 99 mmol/L — AB (ref 101–111)
CO2: 28 mmol/L (ref 22–32)
CREATININE: 1.04 mg/dL (ref 0.61–1.24)
Calcium: 8.7 mg/dL — ABNORMAL LOW (ref 8.9–10.3)
Glucose, Bld: 109 mg/dL — ABNORMAL HIGH (ref 65–99)
POTASSIUM: 3.6 mmol/L (ref 3.5–5.1)
SODIUM: 137 mmol/L (ref 135–145)

## 2016-04-16 LAB — TROPONIN I: Troponin I: <0.03 ng/mL (ref <0.031)

## 2016-04-16 MED ORDER — IBUPROFEN 400 MG PO TABS
400.0000 mg | ORAL_TABLET | Freq: Once | ORAL | Status: AC
  Administered 2016-04-16: 400 mg via ORAL

## 2016-04-16 MED ORDER — IPRATROPIUM-ALBUTEROL 0.5-2.5 (3) MG/3ML IN SOLN
3.0000 mL | Freq: Once | RESPIRATORY_TRACT | Status: AC
  Administered 2016-04-16: 3 mL via RESPIRATORY_TRACT
  Filled 2016-04-16: qty 3

## 2016-04-16 MED ORDER — METHYLPREDNISOLONE SODIUM SUCC 125 MG IJ SOLR
125.0000 mg | Freq: Once | INTRAMUSCULAR | Status: AC
  Administered 2016-04-16: 125 mg via INTRAVENOUS
  Filled 2016-04-16: qty 2

## 2016-04-16 MED ORDER — IBUPROFEN 400 MG PO TABS
ORAL_TABLET | ORAL | Status: AC
  Administered 2016-04-16: 400 mg via ORAL
  Filled 2016-04-16: qty 1

## 2016-04-16 MED ORDER — LEVOFLOXACIN 750 MG PO TABS
750.0000 mg | ORAL_TABLET | Freq: Once | ORAL | Status: AC
  Administered 2016-04-16: 750 mg via ORAL
  Filled 2016-04-16: qty 1

## 2016-04-16 MED ORDER — LEVOFLOXACIN 750 MG PO TABS: 750.0000 mg | ORAL_TABLET | Freq: Every day | ORAL | Status: DC

## 2016-04-16 MED ORDER — PREDNISONE 10 MG PO TABS: ORAL_TABLET | ORAL | Status: DC

## 2016-04-16 NOTE — ED Notes (Signed)
Lord, MD at bedside. 

## 2016-04-16 NOTE — ED Provider Notes (Signed)
[facility] Emergency Department Provider Note   ____________________________________________  Time seen: Upon EMS arrival I have reviewed the triage vital signs and the triage nursing note.  HISTORY  Chief Complaint Respiratory Distress and COPD   Historian Patient  HPI Darius Norris is a 65 y.o. male with a history of COPD, who uses home oxygen as needed, is here for worsening dyspnea which started this afternoon right before he called EMS. No known inciting factor. Has not been on prednisone for COPD in quite some time.  No recent fever. Some cough with some yellow sputum production in the last couple days. Symptoms are moderate. Not sure what makes it worse or better.    Past Medical History  Diagnosis Date  . COPD (chronic obstructive pulmonary disease) ([place])   . Hypertension   . Emphysema lung ([place])   . Colitis   . GERD (gastroesophageal reflux disease)   . CAD (coronary artery disease)     Patient Active Problem List   Diagnosis Date Noted  . COPD exacerbation ([place]) 12/15/2015  . Elevated troponin 12/15/2015  . HTN (hypertension) 12/15/2015  . GERD (gastroesophageal reflux disease) 12/15/2015  . CAD (coronary artery disease) 12/15/2015  . Angina pectoris ([place]) 12/15/2015    Past Surgical History  Procedure Laterality Date  . Abdominal hernia repair Bilateral     Current Outpatient Rx  Name  Route  Sig  Dispense  Refill  . albuterol (PROVENTIL HFA;VENTOLIN HFA) 108 (90 Base) MCG/ACT inhaler   Inhalation   Inhale 2 puffs into the lungs every 6 (six) hours as needed for wheezing or shortness of breath.   1 Inhaler   0   . amitriptyline (ELAVIL) 10 MG tablet   Oral   Take 30 mg by mouth at bedtime.         . budesonide-formoterol (SYMBICORT) 160-4.5 MCG/ACT inhaler   Inhalation   Inhale 2 puffs into the lungs 2 (two) times daily.         . enalapril (VASOTEC) 2.5 MG tablet   Oral   Take 1 tablet (2.5 mg total) by mouth  daily.   30 tablet   0   . gabapentin (NEURONTIN) 300 MG capsule   Oral   Take 300 mg by mouth 3 (three) times daily.         . Ipratropium-Albuterol (COMBIVENT RESPIMAT) 20-100 MCG/ACT AERS respimat   Inhalation   Inhale 1 puff into the lungs every 6 (six) hours as needed for wheezing or shortness of breath.         . isosorbide mononitrate (IMDUR) 60 MG 24 hr tablet   Oral   Take 60 mg by mouth daily.         . metoprolol (LOPRESSOR) 50 MG tablet   Oral   Take 100 mg by mouth 2 (two) times daily.         Marland Kitchen OMEPRAZOLE PO   Oral   Take 1 capsule by mouth daily.         . predniSONE (STERAPRED UNI-PAK 21 TAB) 10 MG (21) TBPK tablet   Oral   Take 1 tablet (10 mg total) by mouth daily. Take 6 tablets by mouth for 1 day followed by  5 tablets by mouth for 1 day followed by  4 tablets by mouth for 1 day followed by  3 tablets by mouth for 1 day followed by  2 tablets by mouth for 1 day followed by  1 tablet by mouth for  a day and stop   21 tablet   0     Allergies Zantac; Codeine; Erythromycin; and Vicodin  History reviewed. No pertinent family history.  Social History Social History  Substance Use Topics  . Smoking status: Former Research scientist (life sciences)  . Smokeless tobacco: Never Used  . Alcohol Use: No    Review of Systems  Constitutional: Negative for fever. Eyes: Negative for visual changes. ENT: Negative for sore throat. Cardiovascular: Negative for chest pain. Respiratory: Positive for shortness of breath. Gastrointestinal: Negative for abdominal pain, vomiting and diarrhea. Genitourinary: Negative for dysuria. Musculoskeletal: Negative for back pain. Skin: Negative for rash. Neurological: Negative for headache. 10 point Review of Systems otherwise negative ____________________________________________   PHYSICAL EXAM:  VITAL SIGNS: ED Triage Vitals  Enc Vitals Group     BP 04/16/16 1716 161/95 mmHg     Pulse Rate 04/16/16 1716 69     Resp 04/16/16  1716 18     Temp 04/16/16 1716 97.9 F (36.6 C)     Temp Source 04/16/16 1716 Oral     SpO2 04/16/16 1716 98 %     Weight 04/16/16 1716 210 lb (95.255 kg)     Height 04/16/16 1716 5\' 6"  (1.676 m)     Head Cir --      Peak Flow --      Pain Score --      Pain Loc --      Pain Edu? --      Excl. in [place]? --      Constitutional: Alert and oriented. Moderate respiratory distress. HEENT   Head: Normocephalic and atraumatic.      Eyes: Conjunctivae are normal. PERRL. Normal extraocular movements.      Ears:         Nose: No congestion/rhinnorhea.   Mouth/Throat: Mucous membranes are moist.   Neck: No stridor. Cardiovascular/Chest: Normal rate, regular rhythm.  No murmurs, rubs, or gallops. Respiratory: No tachypnea, but retractions. Speaks in 4 word sentences. Decreased breath sounds throughout all fields with mild end expiratory wheezing. No rhonchi. Gastrointestinal: Soft. No distention, no guarding, no rebound. Nontender.   Genitourinary/rectal:Deferred Musculoskeletal: Nontender with normal range of motion in all extremities. No joint effusions.  No lower extremity tenderness.  No edema. Neurologic:  Normal speech and language. No gross or focal neurologic deficits are appreciated. Skin:  Skin is warm, dry and intact. No rash noted. Psychiatric: Mood and affect are normal. Speech and behavior are normal. Patient exhibits appropriate insight and judgment.  ____________________________________________   EKG I, Lisa Roca, MD, the attending physician have personally viewed and interpreted all ECGs.  69 bpm. Normal sinus rhythm. Narrow QRS. Normal axis. ST segment depression inferiorly and laterally with T-wave inversions. This is similar to prior EKGs ____________________________________________  LABS (pertinent positives/negatives)  White blood count 10.6, hemoglobin 15.4 and platelet count [id number] Basic metabolic panel within normal limits Troponin less than  0.03  ____________________________________________  RADIOLOGY All Xrays were viewed by me. Imaging interpreted by Radiologist.  Chest portable: Advanced chronic lung disease compatible with COPD/emphysema __________________________________________  PROCEDURES  Procedure(s) performed: None  Critical Care performed: None  ____________________________________________   ED COURSE / ASSESSMENT AND PLAN  Pertinent labs & imaging results that were available during my care of the patient were reviewed by me and considered in my medical decision making (see chart for details).  The patient was not tachypnea, he was retracting and having trouble speaking in full sentences. He appears clinically to have COPD exacerbation. DuoNeb's  were started with Solu-Medrol. His chest x-ray was clear.  Patient was breathing much better after DuoNeb treatment and slight Medrol. On 2 L nasal cannula he maintained oxygenation and was able tolerate walking just fine. I think he will do fine to be treated as an outpatient.  He was reporting an allergy to erythromycin, and so patient was given Levaquin.  We discussed risks and benefit and chose to proceed with Levaquin and prednisone for COPD exacerbation.   CONSULTATIONS:   None   Patient / Family / Caregiver informed of clinical course, medical decision-making process, and agree with plan.   I discussed return precautions, follow-up instructions, and discharged instructions with patient and/or family.   ___________________________________________   FINAL CLINICAL IMPRESSION(S) / ED DIAGNOSES   Final diagnoses:  COPD exacerbation ([place])              Note: This dictation was prepared with Dragon dictation. Any transcriptional errors that result from this process are unintentional   Lisa Roca, MD 04/16/16 2054

## 2016-04-16 NOTE — Discharge Instructions (Signed)
You were evaluated for trouble breathing and are being treated for COPD flareup. Return to emergency department for any worsening condition including fever, trouble breathing, confusion or altered mental status, weakness, chest pain or any other symptoms concerning to you.  Take your albuterol or Combivent inhaler 2 puffs every 4 hours as needed for wheezing or shortness of breath.   Chronic Obstructive Pulmonary Disease Exacerbation Chronic obstructive pulmonary disease (COPD) is a common lung problem. In COPD, the flow of air from the lungs is limited. COPD exacerbations are times that breathing gets worse and you need extra treatment. Without treatment they can be life threatening. If they happen often, your lungs can become more damaged. If your COPD gets worse, your doctor may treat you with:  Medicines.  Oxygen.  Different ways to clear your airway, such as using a mask. HOME CARE  Do not smoke.  Avoid tobacco smoke and other things that bother your lungs.  If given, take your antibiotic medicine as told. Finish the medicine even if you start to feel better.  Only take medicines as told by your doctor.  Drink enough fluids to keep your pee (urine) clear or pale yellow (unless your doctor has told you not to).  Use a cool mist machine (vaporizer).  If you use oxygen or a machine that turns liquid medicine into a mist (nebulizer), continue to use them as told.  Keep up with shots (vaccinations) as told by your doctor.  Exercise regularly.  Eat healthy foods.  Keep all doctor visits as told. GET HELP RIGHT AWAY IF:  You are very short of breath and it gets worse.  You have trouble talking.  You have bad chest pain.  You have blood in your spit (sputum).  You have a fever.  You keep throwing up (vomiting).  You feel weak, or you pass out (faint).  You feel confused.  You keep getting worse. MAKE SURE YOU:  Understand these instructions.  Will watch your  condition.  Will get help right away if you are not doing well or get worse.   This information is not intended to replace advice given to you by your health care provider. Make sure you discuss any questions you have with your health care provider.   Document Released: 11/23/2011 Document Revised: 12/25/2014 Document Reviewed: 08/08/2013 Elsevier Interactive Patient Education [employer].

## 2016-04-16 NOTE — ED Notes (Signed)
Pt ambulated 1 full lap around ED. HR maintained at 85-95 BPM, O2 sats steady at 94% on 2L via [place]. Pt denies any c/o lightheadedness or dizziness, pt's resting O2 sats up to 97-98% on 2L [place] once returned to Rm 8.

## 2016-04-16 NOTE — ED Notes (Signed)
Pt presents to ED from home with wheezing, sob, hot flashes. Per EMS, pt 3L at 98%. Pt alert and oriented.

## 2016-06-27 ENCOUNTER — Encounter: Payer: Non-veteran care | Attending: Internal Medicine | Admitting: Respiratory Therapy

## 2016-06-27 VITALS — Ht 66.0 in | Wt 204.4 lb

## 2016-06-27 DIAGNOSIS — J449 Chronic obstructive pulmonary disease, unspecified: Secondary | ICD-10-CM | POA: Insufficient documentation

## 2016-06-27 NOTE — Progress Notes (Signed)
Pulmonary Individual Treatment Plan  Patient Details  Name: Darius Norris MRN: [id number] Date of Birth: 1951-09-23 Referring Provider:  [facility]  Initial Encounter Date:       Pulmonary Rehab from 06/27/2016 in [facility] Cardiac and Pulmonary Rehab   Date  06/27/16      Visit Diagnosis: Moderate COPD (chronic obstructive pulmonary disease) ([place])  Patient's Home Medications on Admission:  Current outpatient prescriptions:    albuterol (PROVENTIL HFA;VENTOLIN HFA) 108 (90 Base) MCG/ACT inhaler, Inhale 2 puffs into the lungs every 6 (six) hours as needed for wheezing or shortness of breath., Disp: 1 Inhaler, Rfl: 0   amitriptyline (ELAVIL) 10 MG tablet, Take 30 mg by mouth at bedtime., Disp: , Rfl:    budesonide-formoterol (SYMBICORT) 160-4.5 MCG/ACT inhaler, Inhale 2 puffs into the lungs 2 (two) times daily., Disp: , Rfl:    enalapril (VASOTEC) 2.5 MG tablet, Take 1 tablet (2.5 mg total) by mouth daily., Disp: 30 tablet, Rfl: 0   gabapentin (NEURONTIN) 300 MG capsule, Take 300 mg by mouth 3 (three) times daily., Disp: , Rfl:    Ipratropium-Albuterol (COMBIVENT RESPIMAT) 20-100 MCG/ACT AERS respimat, Inhale 1 puff into the lungs every 6 (six) hours as needed for wheezing or shortness of breath., Disp: , Rfl:    isosorbide mononitrate (IMDUR) 60 MG 24 hr tablet, Take 60 mg by mouth daily., Disp: , Rfl:    levofloxacin (LEVAQUIN) 750 MG tablet, Take 1 tablet (750 mg total) by mouth daily., Disp: 4 tablet, Rfl: 0   metoprolol (LOPRESSOR) 50 MG tablet, Take 100 mg by mouth 2 (two) times daily., Disp: , Rfl:    OMEPRAZOLE PO, Take 1 capsule by mouth daily., Disp: , Rfl:    predniSONE (DELTASONE) 10 MG tablet, 50 mg daily for 4 more days, Disp: 20 tablet, Rfl: 0  Past Medical History: Past Medical History  Diagnosis Date   COPD (chronic obstructive pulmonary disease) ([place])    Hypertension    Emphysema lung ([place])    Colitis    GERD (gastroesophageal reflux disease)     CAD (coronary artery disease)     Tobacco Use: History  Smoking status   Former Smoker  Smokeless tobacco   Never Used    Labs: Recent [employer] for ITP Cardiac and Pulmonary Rehab Latest Ref Rng 02/04/2013   Cholestrol 0-200 mg/dL 185   LDLCALC 0-100 mg/dL 114(H)   HDL 40-60 mg/dL 38(L)   Trlycerides 0-200 mg/dL 166       ADL [facility]SD:     Pulmonary Assessment Scores      06/27/16 1131       ADL UCSD   ADL Phase Entry     SOB Score total 50     Rest 1     Walk 3     Stairs 2     Bath 4     Dress 2     Shop 2        Pulmonary Function Assessment:     Pulmonary Function Assessment - 06/27/16 1129    Initial Spirometry Results   FVC% 40 %   FEV1% 34 %   FEV1/FVC Ratio 65.59   Post Bronchodilator Spirometry Results   FVC% 49 %   FEV1% 24 %   FEV1/FVC Ratio 56.11   Breath   Shortness of Breath Yes;Fear of Shortness of Breath;Limiting activity      Exercise Target Goals: Date: 06/27/16  Exercise Program Goal: Individual exercise prescription set  with THRR, safety & activity barriers. Participant demonstrates ability to understand and report RPE using BORG scale, to self-measure pulse accurately, and to acknowledge the importance of the exercise prescription.  Exercise Prescription Goal: Starting with aerobic activity 30 plus minutes a day, 3 days per week for initial exercise prescription. Provide home exercise prescription and guidelines that participant acknowledges understanding prior to discharge.  Activity Barriers & Risk Stratification:     Activity Barriers & Cardiac Risk Stratification - 06/27/16 1128    Activity Barriers & Cardiac Risk Stratification   Activity Barriers Shortness of Breath;Deconditioning   Cardiac Risk Stratification Moderate      6 Minute Walk:     6 Minute Walk      06/27/16 1034       6 Minute Walk   Distance 500 feet     Walk Time 4 minutes     MPH 1.42     METS 1.6     RPE 15      Perceived Dyspnea  4     VO2 Peak 5.5     Symptoms Yes (comment)     Comments low back pain more than breathing     Resting HR 69 bpm     Resting BP 138/74 mmHg     Max Ex. HR 87 bpm     Max Ex. BP 152/72 mmHg        Initial Exercise Prescription:     Initial Exercise Prescription - 06/27/16 1000    Date of Initial Exercise RX and Referring Provider   Date 06/27/16   Oxygen   Oxygen Continuous   Liters 2   Recumbant Bike   Level 1   RPM 60   Minutes 15   METs 1.5   NuStep   Level 1   Minutes 15   METs 1.5   REL-XR   Level 1   Minutes 15   METs 1.5   T5 Nustep   Level 1   Minutes 15   METs 1.5   Biostep-RELP   Level 1   Minutes 15   METs 1.5   Prescription Details   Frequency (times per week) 3   Duration Progress to 45 minutes of aerobic exercise without signs/symptoms of physical distress   Intensity   THRR 40-80% of Max Heartrate 103-139   Ratings of Perceived Exertion 11-15   Perceived Dyspnea 2-4   Progression   Progression Continue to progress workloads to maintain intensity without signs/symptoms of physical distress.   Resistance Training   Training Prescription Yes   Weight 3   Reps 10-15      Perform Capillary Blood Glucose checks as needed.  Exercise Prescription Changes:   Exercise Comments:   Discharge Exercise Prescription (Final Exercise Prescription Changes):    Nutrition:  Target Goals: Understanding of nutrition guidelines, daily intake of sodium 1500mg , cholesterol 200mg , calories 30% from fat and 7% or less from saturated fats, daily to have 5 or more servings of fruits and vegetables.  Biometrics:     Pre Biometrics - 06/27/16 1034    Pre Biometrics   Height 5\' 6"  (1.676 m)   Weight 204 lb 6.4 oz (92.715 kg)   Waist Circumference 49 inches   Hip Circumference 48 inches   Waist to Hip Ratio 1.02 %   BMI (Calculated) 33.1       Nutrition Therapy Plan and Nutrition Goals:   Nutrition Discharge: Rate Your  Plate Scores:   Psychosocial: Target Goals: Acknowledge presence  or absence of depression, maximize coping skills, provide positive support system. Participant is able to verbalize types and ability to use techniques and skills needed for reducing stress and depression.  Initial Review & Psychosocial Screening:     Initial Psych Review & Screening - 06/27/16 [place]? Yes   Comments Mr Lucker has good support from his wife. He admits to panic attacks with shortness of breath and has recently been set-up with a therapist for counciling. He states that this treatment will help along with [name] exercise with suppervision.   Barriers   Psychosocial barriers to participate in program The patient should benefit from training in stress management and relaxation.   Screening Interventions   Interventions Encouraged to exercise;Program counselor consult      Quality of Life Scores:     Quality of Life - 06/27/16 1145    Quality of Life Scores   Health/Function Pre 19.5 %   Socioeconomic Pre 19.17 %   Psych/Spiritual Pre 19 %   Family Pre 20.6 %   GLOBAL Pre 19.5 %      PHQ-9:     Recent Review Flowsheet Data    Depression screen [facility] 2/9 06/27/2016   Decreased Interest 0   Down, Depressed, Hopeless 0   PHQ - 2 Score 0   Altered sleeping 1   Tired, decreased energy 2   Change in appetite 1   Feeling bad or failure about yourself  0   Trouble concentrating 0   Moving slowly or fidgety/restless 0   Suicidal thoughts 0   PHQ-9 Score 4   Difficult doing work/chores Somewhat difficult      Psychosocial Evaluation and Intervention:   Psychosocial Re-Evaluation:  Education: Education Goals: Education classes will be provided on a weekly basis, covering required topics. Participant will state understanding/return demonstration of topics presented.  Learning Barriers/Preferences:     Learning Barriers/Preferences - 06/27/16 1128     Learning Barriers/Preferences   Learning Barriers None   Learning Preferences Group Instruction;Individual Instruction;Pictoral;Skilled Demonstration;Verbal Instruction;Video;Written Material      Education Topics: Initial Evaluation Education: - Verbal, written and demonstration of respiratory meds, RPE/PD scales, oximetry and breathing techniques. Instruction on use of nebulizers and MDIs: cleaning and proper use, rinsing mouth with steroid doses and importance of monitoring MDI activations.          Pulmonary Rehab from 06/27/2016 in [facility] Cardiac and Pulmonary Rehab   Date  06/27/16   Educator  LB   Instruction Review Code  2- meets goals/outcomes      General Nutrition Guidelines/Fats and Fiber: -Group instruction provided by verbal, written material, models and posters to present the general guidelines for heart healthy nutrition. Gives an explanation and review of dietary fats and fiber.   Controlling Sodium/Reading Food Labels: -Group verbal and written material supporting the discussion of sodium use in heart healthy nutrition. Review and explanation with models, verbal and written materials for utilization of the food label.   Exercise Physiology & Risk Factors: - Group verbal and written instruction with models to review the exercise physiology of the cardiovascular system and associated critical values. Details cardiovascular disease risk factors and the goals associated with each risk factor.   Aerobic Exercise & Resistance Training: - Gives group verbal and written discussion on the health impact of inactivity. On the components of aerobic and resistive training programs and the benefits of this training and how to safely progress through these  programs.      Pulmonary Rehab from 04/21/2015 in [place]   Date  04/21/15   Educator  [place]   Instruction Review Code  2- meets goals/outcomes      Flexibility, Balance, General  Exercise Guidelines: - Provides group verbal and written instruction on the benefits of flexibility and balance training programs. Provides general exercise guidelines with specific guidelines to those with heart or lung disease. Demonstration and skill practice provided.      Pulmonary Rehab from 04/21/2015 in [place]   Date  04/21/15   Educator  [place]   Instruction Review Code  2- meets goals/outcomes      Stress Management: - Provides group verbal and written instruction about the health risks of elevated stress, cause of high stress, and healthy ways to reduce stress.   Depression: - Provides group verbal and written instruction on the correlation between heart/lung disease and depressed mood, treatment options, and the stigmas associated with seeking treatment.   Exercise & Equipment Safety: - Individual verbal instruction and demonstration of equipment use and safety with use of the equipment.   Infection Prevention: - Provides verbal and written material to individual with discussion of infection control including proper hand washing and proper equipment cleaning during exercise session.      Pulmonary Rehab from 06/27/2016 in [facility] Cardiac and Pulmonary Rehab   Date  06/27/16   Educator  LB   Instruction Review Code  2- meets goals/outcomes      Falls Prevention: - Provides verbal and written material to individual with discussion of falls prevention and safety.      Pulmonary Rehab from 06/27/2016 in [facility] Cardiac and Pulmonary Rehab   Date  06/27/16   Educator  LB   Instruction Review Code  2- meets goals/outcomes      Diabetes: - Individual verbal and written instruction to review signs/symptoms of diabetes, desired ranges of glucose level fasting, after meals and with exercise. Advice that pre and post exercise glucose checks will be done for 3 sessions at entry of program.   Chronic Lung Diseases: - Group verbal and  written instruction to review new updates, new respiratory medications, new advancements in procedures and treatments. Provide informative websites and "800" numbers of self-education.      Pulmonary Rehab from 04/26/2015 in [place]   Date  04/26/15   Educator  LB   Instruction Review Code  2- meets goals/outcomes      Lung Procedures: - Group verbal and written instruction to describe testing methods done to diagnose lung disease. Review the outcome of test results. Describe the treatment choices: Pulmonary Function Tests, ABGs and oximetry.      Pulmonary Rehab from 04/30/2015 in [place]   Date  04/30/15   Educator  [place]   Instruction Review Code  2- meets goals/outcomes      Energy Conservation: - Provide group verbal and written instruction for methods to conserve energy, plan and organize activities. Instruct on pacing techniques, use of adaptive equipment and posture/positioning to relieve shortness of breath.      Pulmonary Rehab from 05/24/2015 in [place]   Date  05/12/15   Educator  SW   Instruction Review Code  2- meets goals/outcomes      Triggers: - Group verbal and written instruction to review types of environmental controls: home humidity, furnaces, filters,  dust mite/pet prevention, HEPA vacuums. To discuss weather changes, air quality and the benefits of nasal washing.   Exacerbations: - Group verbal and written instruction to provide: warning signs, infection symptoms, calling MD promptly, preventive modes, and value of vaccinations. Review: effective airway clearance, coughing and/or vibration techniques. Create an Sports administrator.      Pulmonary Rehab from 05/24/2015 in [place]   Date  05/24/15   Educator  L.[place]   Instruction Review Code  2- meets goals/outcomes      Oxygen: - Individual and group verbal  and written instruction on oxygen therapy. Includes supplement oxygen, available portable oxygen systems, continuous and intermittent flow rates, oxygen safety, concentrators, and Medicare reimbursement for oxygen.      Pulmonary Rehab from 06/27/2016 in [facility] Cardiac and Pulmonary Rehab   Date  06/27/16   Educator  LB   Instruction Review Code  2- meets goals/outcomes      Respiratory Medications: - Group verbal and written instruction to review medications for lung disease. Drug class, frequency, complications, importance of spacers, rinsing mouth after steroid MDI's, and proper cleaning methods for nebulizers.      Pulmonary Rehab from 06/27/2016 in [facility] Cardiac and Pulmonary Rehab   Date  06/27/16   Educator  LB   Instruction Review Code  2- meets goals/outcomes      AED/CPR: - Group verbal and written instruction with the use of models to demonstrate the basic use of the AED with the basic ABC's of resuscitation.   Breathing Retraining: - Provides individuals verbal and written instruction on purpose, frequency, and proper technique of diaphragmatic breathing and pursed-lipped breathing. Applies individual practice skills.      Pulmonary Rehab from 06/27/2016 in [facility] Cardiac and Pulmonary Rehab   Date  06/27/16   Educator  LB   Instruction Review Code  2- meets goals/outcomes      Anatomy and Physiology of the Lungs: - Group verbal and written instruction with the use of models to provide basic lung anatomy and physiology related to function, structure and complications of lung disease.   Heart Failure: - Group verbal and written instruction on the basics of heart failure: signs/symptoms, treatments, explanation of ejection fraction, enlarged heart and cardiomyopathy.   Sleep Apnea: - Individual verbal and written instruction to review Obstructive Sleep Apnea. Review of risk factors, methods for diagnosing and types of masks and machines for OSA.   Anxiety: - Provides  group, verbal and written instruction on the correlation between heart/lung disease and anxiety, treatment options, and management of anxiety.   Relaxation: - Provides group, verbal and written instruction about the benefits of relaxation for patients with heart/lung disease. Also provides patients with examples of relaxation techniques.      Pulmonary Rehab from 05/05/2015 in [place]   Date  05/05/15   Educator  Lucianne Lei LCSW   Instruction Review Code  2- Meets goals/outcomes      Knowledge Questionnaire Score:     Knowledge Questionnaire Score - 06/27/16 1129    Knowledge Questionnaire Score   Pre Score 8/10       Core Components/Risk Factors/Patient Goals at Admission:     Personal Goals and Risk Factors at Admission - 06/27/16 1136    Core Components/Risk Factors/Patient Goals on Admission    Weight Management Yes;Weight Loss   Intervention Weight Management: Develop a combined nutrition and exercise program designed to reach desired caloric intake, while maintaining appropriate intake  of nutrient and fiber, sodium and fats, and appropriate energy expenditure required for the weight goal.;Weight Management: Provide education and appropriate resources to help participant work on and attain dietary goals.   Admit Weight 204 lb 6.4 oz (92.715 kg)   Goal Weight: Short Term 200 lb (90.719 kg)   Goal Weight: Long Term 150 lb (68.04 kg)   Expected Outcomes Short Term: Continue to assess and modify interventions until short term weight is achieved   Sedentary Yes   Intervention Provide advice, education, support and counseling about physical activity/exercise needs.;Develop an individualized exercise prescription for aerobic and resistive training based on initial evaluation findings, risk stratification, comorbidities and participant's personal goals.  Home stationary exercise bike   Expected Outcomes Achievement of increased  cardiorespiratory fitness and enhanced flexibility, muscular endurance and strength shown through measurements of functional capacity and personal statement of participant.   Increase Strength and Stamina Yes   Intervention Provide advice, education, support and counseling about physical activity/exercise needs.;Develop an individualized exercise prescription for aerobic and resistive training based on initial evaluation findings, risk stratification, comorbidities and participant's personal goals.   Expected Outcomes Achievement of increased cardiorespiratory fitness and enhanced flexibility, muscular endurance and strength shown through measurements of functional capacity and personal statement of participant.   Improve shortness of breath with ADL's Yes   Intervention Provide education, individualized exercise plan and daily activity instruction to help decrease symptoms of SOB with activities of daily living.   Expected Outcomes Short Term: Achieves a reduction of symptoms when performing activities of daily living.   Develop more efficient breathing techniques such as purse lipped breathing and diaphragmatic breathing; and practicing self-pacing with activity Yes   Intervention Provide education, demonstration and support about specific breathing techniuqes utilized for more efficient breathing. Include techniques such as pursed lipped breathing, diaphragmatic breathing and self-pacing activity.   Expected Outcomes Short Term: Participant will be able to demonstrate and use breathing techniques as needed throughout daily activities.   Increase knowledge of respiratory medications and ability to use respiratory devices properly  Yes   Intervention Provide education and demonstration as needed of appropriate use of medications, inhalers, and oxygen therapy.  Oxygen 2l/m [place]; MDI's: Combivent Respimat, Symbicort; Svn: Albuterol; Uses a spacer   Expected Outcomes Short Term: Achieves understanding of  medications use. Understands that oxygen is a medication prescribed by physician. Demonstrates appropriate use of inhaler and oxygen therapy.   Hypertension Yes   Intervention Provide education on lifestyle modifcations including regular physical activity/exercise, weight management, moderate sodium restriction and increased consumption of fresh fruit, vegetables, and low fat dairy, alcohol moderation, and smoking cessation.;Monitor prescription use compliance.   Expected Outcomes Short Term: Continued assessment and intervention until BP is < 140/60mm HG in hypertensive participants. < 130/33mm HG in hypertensive participants with diabetes, heart failure or chronic kidney disease.;Long Term: Maintenance of blood pressure at goal levels.      Core Components/Risk Factors/Patient Goals Review:    Core Components/Risk Factors/Patient Goals at Discharge (Final Review):    ITP Comments:   Comments: Mr Cowden plans to start [place] after his dermatology appointments are complete and will attend 3 days/week.

## 2016-06-27 NOTE — Patient Instructions (Signed)
Patient Instructions  Patient Details  Name: Darius Norris MRN: [id number] Date of Birth: Dec 31, 1950 Referring Provider:  Lee are the personal goals you chose as well as exercise and nutrition goals. Our goal is to help you keep on track towards obtaining and maintaining your goals. We will be discussing your progress on these goals with you throughout the program.  Initial Exercise Prescription:     Initial Exercise Prescription - 06/27/16 1000    Date of Initial Exercise RX and Referring Provider   Date 06/27/16   Oxygen   Oxygen Continuous   Liters 2   Recumbant Bike   Level 1   RPM 60   Minutes 15   METs 1.5   NuStep   Level 1   Minutes 15   METs 1.5   REL-XR   Level 1   Minutes 15   METs 1.5   T5 Nustep   Level 1   Minutes 15   METs 1.5   Biostep-RELP   Level 1   Minutes 15   METs 1.5   Prescription Details   Frequency (times per week) 3   Duration Progress to 45 minutes of aerobic exercise without signs/symptoms of physical distress   Intensity   THRR 40-80% of Max Heartrate 103-139   Ratings of Perceived Exertion 11-15   Perceived Dyspnea 2-4   Progression   Progression Continue to progress workloads to maintain intensity without signs/symptoms of physical distress.   Resistance Training   Training Prescription Yes   Weight 3   Reps 10-15      Exercise Goals: Frequency: Be able to perform aerobic exercise three times per week working toward 3-5 days per week.  Intensity: Work with a perceived exertion of 11 (fairly light) - 15 (hard) as tolerated. Follow your new exercise prescription and watch for changes in prescription as you progress with the program. Changes will be reviewed with you when they are made.  Duration: You should be able to do 30 minutes of continuous aerobic exercise in addition to a 5 minute warm-up and a 5 minute cool-down routine.  Nutrition Goals: Your personal nutrition goals will be established  when you do your nutrition analysis with the dietician.  The following are nutrition guidelines to follow: Cholesterol < 200mg /day Sodium < 1500mg /day Fiber: Men over 50 yrs - 30 grams per day  Personal Goals:     Personal Goals and Risk Factors at Admission - 06/27/16 1136    Core Components/Risk Factors/Patient Goals on Admission    Weight Management Yes;Weight Loss   Intervention Weight Management: Develop a combined nutrition and exercise program designed to reach desired caloric intake, while maintaining appropriate intake of nutrient and fiber, sodium and fats, and appropriate energy expenditure required for the weight goal.;Weight Management: Provide education and appropriate resources to help participant work on and attain dietary goals.   Admit Weight 204 lb 6.4 oz (92.715 kg)   Goal Weight: Short Term 200 lb (90.719 kg)   Goal Weight: Long Term 150 lb (68.04 kg)   Expected Outcomes Short Term: Continue to assess and modify interventions until short term weight is achieved   Sedentary Yes   Intervention Provide advice, education, support and counseling about physical activity/exercise needs.;Develop an individualized exercise prescription for aerobic and resistive training based on initial evaluation findings, risk stratification, comorbidities and participant's personal goals.  Home stationary exercise bike   Expected Outcomes Achievement of increased cardiorespiratory fitness and enhanced flexibility, muscular  endurance and strength shown through measurements of functional capacity and personal statement of participant.   Increase Strength and Stamina Yes   Intervention Provide advice, education, support and counseling about physical activity/exercise needs.;Develop an individualized exercise prescription for aerobic and resistive training based on initial evaluation findings, risk stratification, comorbidities and participant's personal goals.   Expected Outcomes Achievement of  increased cardiorespiratory fitness and enhanced flexibility, muscular endurance and strength shown through measurements of functional capacity and personal statement of participant.   Improve shortness of breath with ADL's Yes   Intervention Provide education, individualized exercise plan and daily activity instruction to help decrease symptoms of SOB with activities of daily living.   Expected Outcomes Short Term: Achieves a reduction of symptoms when performing activities of daily living.   Develop more efficient breathing techniques such as purse lipped breathing and diaphragmatic breathing; and practicing self-pacing with activity Yes   Intervention Provide education, demonstration and support about specific breathing techniuqes utilized for more efficient breathing. Include techniques such as pursed lipped breathing, diaphragmatic breathing and self-pacing activity.   Expected Outcomes Short Term: Participant will be able to demonstrate and use breathing techniques as needed throughout daily activities.   Increase knowledge of respiratory medications and ability to use respiratory devices properly  Yes   Intervention Provide education and demonstration as needed of appropriate use of medications, inhalers, and oxygen therapy.  Oxygen 2l/m [place]; MDI's: Combivent Respimat, Symbicort; Svn: Albuterol; Uses a spacer   Expected Outcomes Short Term: Achieves understanding of medications use. Understands that oxygen is a medication prescribed by physician. Demonstrates appropriate use of inhaler and oxygen therapy.   Hypertension Yes   Intervention Provide education on lifestyle modifcations including regular physical activity/exercise, weight management, moderate sodium restriction and increased consumption of fresh fruit, vegetables, and low fat dairy, alcohol moderation, and smoking cessation.;Monitor prescription use compliance.   Expected Outcomes Short Term: Continued assessment and intervention until  BP is < 140/46mm HG in hypertensive participants. < 130/83mm HG in hypertensive participants with diabetes, heart failure or chronic kidney disease.;Long Term: Maintenance of blood pressure at goal levels.      Tobacco Use Initial Evaluation: History  Smoking status   Former Smoker  Smokeless tobacco   Never Used    Copy of goals given to participant.

## 2016-07-17 ENCOUNTER — Encounter: Payer: Non-veteran care | Admitting: *Deleted

## 2016-07-17 DIAGNOSIS — J449 Chronic obstructive pulmonary disease, unspecified: Secondary | ICD-10-CM

## 2016-07-17 NOTE — Progress Notes (Signed)
Daily Session Note  Patient Details  Name: Darius Norris MRN: [id number] Date of Birth: 1950/12/23 Referring Provider:    Encounter Date: 07/17/2016  Check In:     Session Check In - 07/17/16 1122      Check-In   Location [facility]   Staff Present Carson Myrtle, BS, RRT, Respiratory Therapist;Aerica Rincon Amedeo Plenty, BS, ACSM CEP, Exercise Physiologist;Jessica Luan Pulling, MA, ACSM RCEP, Exercise Physiologist   Supervising physician immediately available to respond to emergencies [name] immediately available ER MD   Physician(s) Kerman Passey and Quale   Medication changes reported     No   Fall or balance concerns reported    No   Warm-up and Cool-down Performed on first and last piece of equipment   Resistance Training Performed Yes   VAD Patient? No     Pain Assessment   Currently in Pain? Yes   Pain Location Back   Pain Type Chronic pain   Multiple Pain Sites No         Goals Met:  Proper associated with RPD/PD & O2 Sat Independence with exercise equipment Exercise tolerated well Personal goals reviewed Strength training completed today  Goals Unmet:  Not Applicable  Comments: Today was the patient's first day of class. The patient's initial exercise prescription (based on the 6 min walk evaluation) was reviewed with the patient.    Dr. Emily Filbert is Medical Director for [place] and [name].

## 2016-07-19 ENCOUNTER — Encounter: Payer: Medicare Other | Attending: Internal Medicine

## 2016-07-19 DIAGNOSIS — J449 Chronic obstructive pulmonary disease, unspecified: Secondary | ICD-10-CM | POA: Insufficient documentation

## 2016-07-19 NOTE — Progress Notes (Signed)
Daily Session Note  Patient Details  Name: Darius Norris MRN: [id number] Date of Birth: 04-14-1951 Referring Provider:    Encounter Date: 07/19/2016  Check In:     Session Check In - 07/19/16 1221      Check-In   Staff Present Heath Lark, RN, BSN, CCRP;Laureen Owens Shark, BS, RRT, Respiratory Dareen Piano, BA, ACSM CEP, Exercise Physiologist   Supervising physician immediately available to respond to emergencies [name] immediately available ER MD   Physician(s) Quentin Cornwall and Marcelene Butte   Medication changes reported     No   Fall or balance concerns reported    No   Warm-up and Cool-down Performed on first and last piece of equipment   Resistance Training Performed Yes   VAD Patient? No     Pain Assessment   Currently in Pain? No/denies         Goals Met:  Proper associated with RPD/PD & O2 Sat Independence with exercise equipment Exercise tolerated well Strength training completed today  Goals Unmet:  Not Applicable  Comments: Pt able to follow exercise prescription today without complaint.  Will continue to monitor for progression.    Dr. Emily Filbert is Medical Director for [facility] and [name].

## 2016-07-21 ENCOUNTER — Encounter: Payer: Medicare Other | Admitting: *Deleted

## 2016-07-21 DIAGNOSIS — J449 Chronic obstructive pulmonary disease, unspecified: Secondary | ICD-10-CM | POA: Diagnosis not present

## 2016-07-21 NOTE — Progress Notes (Signed)
Daily Session Note  Patient Details  Name: Darius Norris MRN: [id number] Date of Birth: 1951-02-05 Referring Provider:    Encounter Date: 07/21/2016  Check In:     Session Check In - 07/21/16 1213      Check-In   Staff Present Heath Lark, RN, BSN, CCRP;Laureen Owens Shark, BS, RRT, Respiratory Therapist;Carroll Enterkin, RN, BSN   Supervising physician immediately available to respond to emergencies [name] immediately available ER MD   Physician(s) Drs: Kerman Passey and Darl Householder   Medication changes reported     No   Fall or balance concerns reported    No   Warm-up and Cool-down Performed on first and last piece of equipment   Resistance Training Performed Yes   VAD Patient? No     Pain Assessment   Currently in Pain? No/denies         Goals Met:  Independence with exercise equipment Exercise tolerated well Strength training completed today  Goals Unmet:  Not Applicable  Comments: Doing well with exercise prescription progression.    Dr. Emily Filbert is Medical Director for [place] and [name].

## 2016-07-24 ENCOUNTER — Encounter: Payer: Self-pay | Admitting: *Deleted

## 2016-07-24 DIAGNOSIS — J449 Chronic obstructive pulmonary disease, unspecified: Secondary | ICD-10-CM

## 2016-07-24 NOTE — Progress Notes (Signed)
Pulmonary Individual Treatment Plan  Patient Details  Name: EMRE STOCK MRN: [id number] Date of Birth: Jul 27, 1951 Referring Provider:    Initial Encounter Date:  Flowsheet Row Pulmonary Rehab from 06/27/2016 in [facility] Cardiac and Pulmonary Rehab  Date  06/27/16      Visit Diagnosis: Moderate COPD (chronic obstructive pulmonary disease) ([place])  Patient's Home Medications on Admission:  Current Outpatient Prescriptions:  .  albuterol (PROVENTIL HFA;VENTOLIN HFA) 108 (90 Base) MCG/ACT inhaler, Inhale 2 puffs into the lungs every 6 (six) hours as needed for wheezing or shortness of breath., Disp: 1 Inhaler, Rfl: 0 .  amitriptyline (ELAVIL) 10 MG tablet, Take 30 mg by mouth at bedtime., Disp: , Rfl:  .  budesonide-formoterol (SYMBICORT) 160-4.5 MCG/ACT inhaler, Inhale 2 puffs into the lungs 2 (two) times daily., Disp: , Rfl:  .  enalapril (VASOTEC) 2.5 MG tablet, Take 1 tablet (2.5 mg total) by mouth daily., Disp: 30 tablet, Rfl: 0 .  gabapentin (NEURONTIN) 300 MG capsule, Take 300 mg by mouth 3 (three) times daily., Disp: , Rfl:  .  Ipratropium-Albuterol (COMBIVENT RESPIMAT) 20-100 MCG/ACT AERS respimat, Inhale 1 puff into the lungs every 6 (six) hours as needed for wheezing or shortness of breath., Disp: , Rfl:  .  isosorbide mononitrate (IMDUR) 60 MG 24 hr tablet, Take 60 mg by mouth daily., Disp: , Rfl:  .  levofloxacin (LEVAQUIN) 750 MG tablet, Take 1 tablet (750 mg total) by mouth daily., Disp: 4 tablet, Rfl: 0 .  metoprolol (LOPRESSOR) 50 MG tablet, Take 100 mg by mouth 2 (two) times daily., Disp: , Rfl:  .  OMEPRAZOLE PO, Take 1 capsule by mouth daily., Disp: , Rfl:  .  predniSONE (DELTASONE) 10 MG tablet, 50 mg daily for 4 more days, Disp: 20 tablet, Rfl: 0  Past Medical History: Past Medical History:  Diagnosis Date  . CAD (coronary artery disease)   . Colitis   . COPD (chronic obstructive pulmonary disease) ([place])   . Emphysema lung ([place])   . GERD (gastroesophageal reflux  disease)   . Hypertension     Tobacco Use: History  Smoking Status  . Former Smoker  Smokeless Tobacco  . Never Used    Labs: Recent [employer] for ITP Cardiac and Pulmonary Rehab Latest Ref Rng & Units 02/04/2013   Cholestrol 0 - 200 mg/dL 185   LDLCALC 0 - 100 mg/dL 114(H)   HDL 40 - 60 mg/dL 38(L)   Trlycerides 0 - 200 mg/dL 166       ADL UCSD:     Pulmonary Assessment Scores    Row Name 06/27/16 1131         ADL UCSD   ADL Phase Entry     SOB Score total 50     Rest 1     Walk 3     Stairs 2     Bath 4     Dress 2     Shop 2        Pulmonary Function Assessment:     Pulmonary Function Assessment - 06/27/16 1129      Initial Spirometry Results   FVC% 40 %   FEV1% 34 %   FEV1/FVC Ratio 65.59     Post Bronchodilator Spirometry Results   FVC% 49 %   FEV1% 24 %   FEV1/FVC Ratio 56.11     Breath   Shortness of Breath Yes;Fear of Shortness of Breath;Limiting activity      Exercise  Target Goals:    Exercise Program Goal: Individual exercise prescription set with THRR, safety & activity barriers. Participant demonstrates ability to understand and report RPE using BORG scale, to self-measure pulse accurately, and to acknowledge the importance of the exercise prescription.  Exercise Prescription Goal: Starting with aerobic activity 30 plus minutes a day, 3 days per week for initial exercise prescription. Provide home exercise prescription and guidelines that participant acknowledges understanding prior to discharge.  Activity Barriers & Risk Stratification:     Activity Barriers & Cardiac Risk Stratification - 06/27/16 1128      Activity Barriers & Cardiac Risk Stratification   Activity Barriers Shortness of Breath;Deconditioning   Cardiac Risk Stratification Moderate      6 Minute Walk:     6 Minute Walk    Row Name 06/27/16 1034         6 Minute Walk   Distance 500 feet     Walk Time 4 minutes     MPH 1.42      METS 1.6     RPE 15     Perceived Dyspnea  4     VO2 Peak 5.5     Symptoms Yes (comment)     Comments low back pain more than breathing     Resting HR 69 bpm     Resting BP 138/74     Max Ex. HR 87 bpm     Max Ex. BP 152/72        Initial Exercise Prescription:     Initial Exercise Prescription - 06/27/16 1000      Date of Initial Exercise RX and Referring Provider   Date 06/27/16     Oxygen   Oxygen Continuous   Liters 2     Recumbant Bike   Level 1   RPM 60   Minutes 15   METs 1.5     NuStep   Level 1   Minutes 15   METs 1.5     REL-XR   Level 1   Minutes 15   METs 1.5     T5 Nustep   Level 1   Minutes 15   METs 1.5     Biostep-RELP   Level 1   Minutes 15   METs 1.5     Prescription Details   Frequency (times per week) 3   Duration Progress to 45 minutes of aerobic exercise without signs/symptoms of physical distress     Intensity   THRR 40-80% of Max Heartrate 103-139   Ratings of Perceived Exertion 11-15   Perceived Dyspnea 2-4     Progression   Progression Continue to progress workloads to maintain intensity without signs/symptoms of physical distress.     Resistance Training   Training Prescription Yes   Weight 3   Reps 10-15      Perform Capillary Blood Glucose checks as needed.  Exercise Prescription Changes:     Exercise Prescription Changes    Row Name 07/17/16 1100 07/19/16 1400           Exercise Review   Progression  - Yes        Response to Exercise   Blood Pressure (Admit) 140/78 120/70      Blood Pressure (Exercise) 132/70 164/76      Blood Pressure (Exit) 138/70 130/64      Heart Rate (Admit) 78 bpm 73 bpm      Heart Rate (Exercise) 101 bpm 84 bpm      Heart Rate (  Exit) 87 bpm 74 bpm      Oxygen Saturation (Admit) 96 % 92 %      Oxygen Saturation (Exercise) 92 % 91 %      Oxygen Saturation (Exit) 95 % 95 %      Rating of Perceived Exertion (Exercise) 14 13      Perceived Dyspnea (Exercise) 4 3       Duration Progress to 30 minutes of continuous aerobic without signs/symptoms of physical distress Progress to 45 minutes of aerobic exercise without signs/symptoms of physical distress      Intensity THRR unchanged  103-139 THRR unchanged        Progression   Progression Continue progressive overload as per policy without signs/symptoms or physical distress. Continue to progress workloads to maintain intensity without signs/symptoms of physical distress.      Average METs 1.9 2        Resistance Training   Training Prescription Yes Yes      Weight 3 3      Reps 10-15 10-15        Interval Training   Interval Training  - No        Oxygen   Oxygen Continuous Continuous      Liters 2 2        Recumbant Bike   Level 1 -  omitted due to time today      RPM 30  -      Minutes 6  3-3 intervals  -      METs 1.5  -        REL-XR   Level 2 2      Minutes 9 15      METs 1.9 2        Biostep-RELP   Level 1 1      Minutes 10 15      METs 1 2         Exercise Comments:     Exercise Comments    Row Name 07/17/16 1136 07/19/16 1442         Exercise Comments Today was the patient's first day of class. The patient's initial exercise prescription (based on the 6 min walk evaluation) was reviewed with the patient. [name] is off to a good start with exercise.  He had now done two sessions.  He already made some improvement.  We will continue to monitor for improvement.         Discharge Exercise Prescription (Final Exercise Prescription Changes):     Exercise Prescription Changes - 07/19/16 1400      Exercise Review   Progression Yes     Response to Exercise   Blood Pressure (Admit) 120/70   Blood Pressure (Exercise) 164/76   Blood Pressure (Exit) 130/64   Heart Rate (Admit) 73 bpm   Heart Rate (Exercise) 84 bpm   Heart Rate (Exit) 74 bpm   Oxygen Saturation (Admit) 92 %   Oxygen Saturation (Exercise) 91 %   Oxygen Saturation (Exit) 95 %   Rating of Perceived Exertion  (Exercise) 13   Perceived Dyspnea (Exercise) 3   Duration Progress to 45 minutes of aerobic exercise without signs/symptoms of physical distress   Intensity THRR unchanged     Progression   Progression Continue to progress workloads to maintain intensity without signs/symptoms of physical distress.   Average METs 2     Resistance Training   Training Prescription Yes   Weight 3   Reps 10-15  Interval Training   Interval Training No     Oxygen   Oxygen Continuous   Liters 2     Recumbant Bike   Level --  omitted due to time today     REL-XR   Level 2   Minutes 15   METs 2     Biostep-RELP   Level 1   Minutes 15   METs 2       Nutrition:  Target Goals: Understanding of nutrition guidelines, daily intake of sodium <1563m, cholesterol <2066m calories 30% from fat and 7% or less from saturated fats, daily to have 5 or more servings of fruits and vegetables.  Biometrics:     Pre Biometrics - 06/27/16 1034      Pre Biometrics   Height _0  (1.676 m)   Weight 204 lb 6.4 oz (92.7 kg)   Waist Circumference 49 inches   Hip Circumference 48 inches   Waist to Hip Ratio 1.02 %   BMI (Calculated) 33.1       Nutrition Therapy Plan and Nutrition Goals:   Nutrition Discharge: Rate Your Plate Scores:   Psychosocial: Target Goals: Acknowledge presence or absence of depression, maximize coping skills, provide positive support system. Participant is able to verbalize types and ability to use techniques and skills needed for reducing stress and depression.  Initial Review & Psychosocial Screening:     Initial Psych Review & Screening - 06/27/16 11CanadianYes   Comments Mr RiRideneras good support from his wife. He admits to panic attacks with shortness of breath and has recently been set-up with a therapist for counciling. He states that this treatment will help along with [name] exercise with suppervision.      Barriers   Psychosocial barriers to participate in program The patient should benefit from training in stress management and relaxation.     Screening Interventions   Interventions Encouraged to exercise;Program counselor consult      Quality of Life Scores:     Quality of Life - 06/27/16 1145      Quality of Life Scores   Health/Function Pre 19.5 %   Socioeconomic Pre 19.17 %   Psych/Spiritual Pre 19 %   Family Pre 20.6 %   GLOBAL Pre 19.5 %      PHQ-9: Recent Review Flowsheet Data    [facility] 06/27/2016   Decreased Interest 0   Down, Depressed, Hopeless 0   PHQ - 2 Score 0   Altered sleeping 1   Tired, decreased energy 2   Change in appetite 1   Feeling bad or failure about yourself  0   Trouble concentrating 0   Moving slowly or fidgety/restless 0   Suicidal thoughts 0   PHQ-9 Score 4   Difficult doing work/chores Somewhat difficult      Psychosocial Evaluation and Intervention:     Psychosocial Evaluation - 07/17/16 1058      Psychosocial Evaluation & Interventions   Comments Counselor met with Mr. RiParcooday for initial psychosocial evaluation.   He was in this program approximately 1 year ago and reports having a "bad episode" about a month ago which resulted in his Dr. recommending a return to Pulmonary Rehab.  Mr. RiHaranas COPD and is otherwise in fairly good health.  He has a spouse of [id number], an adult daughter and several sisters who live close by and are his strong support system.  Mr. Luse states he sleeps okay with the help of OTC Melatonin at night as needed.  His appetite is good as well.  Mr. Tennell reports a recent history of panic attacks resulting in his psychiatrist adding a medication for this which he states is Buspiron @ 31m.  This counselor did not see this medication on Mr. Shewmake's list and asked him to bring it to next class for a nurse to add it to the medication list.  He states this has been helpful in  addressing these symptoms.  Mr. RMayhallreports his mood is "okay" and his health is his primary stress at this time.  Mr. RHedmanhas goals to lose weight; breathe better; and increase his stamina while in this program.  Counselor encouraged Mr. RFullbrightto begin considering how he will maintain exercising consistently upon completion of this program and he agreed to work on this as a goal as well.        Psychosocial Re-Evaluation:  Education: Education Goals: Education classes will be provided on a weekly basis, covering required topics. Participant will state understanding/return demonstration of topics presented.  Learning Barriers/Preferences:     Learning Barriers/Preferences - 06/27/16 1128      Learning Barriers/Preferences   Learning Barriers None   Learning Preferences Group Instruction;Individual Instruction;Pictoral;Skilled Demonstration;Verbal Instruction;Video;Written Material      Education Topics: Initial Evaluation Education: - Verbal, written and demonstration of respiratory meds, RPE/PD scales, oximetry and breathing techniques. Instruction on use of nebulizers and MDIs: cleaning and proper use, rinsing mouth with steroid doses and importance of monitoring MDI activations. [facility]  Date  07/17/16  [facility]  [facility] Instruction Review Code  2- meets goals/outcomes      General Nutrition Guidelines/Fats and Fiber: -Group instruction provided by verbal, written material, models and posters to present the general guidelines for heart healthy nutrition. Gives an explanation and review of dietary fats and fiber.   Controlling Sodium/Reading Food Labels: -Group verbal and written material supporting the discussion of sodium use in heart healthy nutrition. Review and explanation with models, verbal and written materials for utilization of the food label.   Exercise Physiology & Risk Factors: - Group  verbal and written instruction with models to review the exercise physiology of the cardiovascular system and associated critical values. Details cardiovascular disease risk factors and the goals associated with each risk factor.   Aerobic Exercise & Resistance Training: - Gives group verbal and written discussion on the health impact of inactivity. On the components of aerobic and resistive training programs and the benefits of this training and how to safely progress through these programs. Flowsheet Row Pulmonary Rehab from [place]/21/2015 in [place] Date  04/21/15  Educator  [place] Instruction Review Code  2- meets goals/outcomes      Flexibility, Balance, General Exercise Guidelines: - Provides group verbal and written instruction on the benefits of flexibility and balance training programs. Provides general exercise guidelines with specific guidelines to those with heart or lung disease. Demonstration and skill practice provided. Flowsheet Row Pulmonary Rehab from 04/21/2015 in [place] Date  04/21/15  [place]  [place] Instruction Review Code  2- meets goals/outcomes      Stress Management: - Provides group verbal and written instruction about the health risks of elevated stress, cause of high stress, and healthy ways to reduce stress.   Depression: -  Provides group verbal and written instruction on the correlation between heart/lung disease and depressed mood, treatment options, and the stigmas associated with seeking treatment.   Exercise & Equipment Safety: - Individual verbal instruction and demonstration of equipment use and safety with use of the equipment. Flowsheet Row Pulmonary Rehab from 07/17/2016 in [facility] Cardiac and Pulmonary Rehab  Date  07/17/16  Educator  [facility]  Instruction Review Code  2- meets goals/outcomes      Infection Prevention: - Provides verbal and written material to  individual with discussion of infection control including proper hand washing and proper equipment cleaning during exercise session. Flowsheet Row Pulmonary Rehab from 07/17/2016 in [facility] Cardiac and Pulmonary Rehab  Date  07/17/16  Educator  [facility]  Instruction Review Code  2- meets goals/outcomes      Falls Prevention: - Provides verbal and written material to individual with discussion of falls prevention and safety. Flowsheet Row Pulmonary Rehab from 07/17/2016 in [facility] Cardiac and Pulmonary Rehab  Date  06/27/16  Educator  LB  Instruction Review Code  2- meets goals/outcomes      Diabetes: - Individual verbal and written instruction to review signs/symptoms of diabetes, desired ranges of glucose level fasting, after meals and with exercise. Advice that pre and post exercise glucose checks will be done for 3 sessions at entry of program.   Chronic Lung Diseases: - Group verbal and written instruction to review new updates, new respiratory medications, new advancements in procedures and treatments. Provide informative websites and "800" numbers of self-education. Flowsheet Row Pulmonary Rehab from 04/26/2015 in [place]  Date  04/26/15  Educator  LB  Instruction Review Code  2- meets goals/outcomes      Lung Procedures: - Group verbal and written instruction to describe testing methods done to diagnose lung disease. Review the outcome of test results. Describe the treatment choices: Pulmonary Function Tests, ABGs and oximetry. Flowsheet Row Pulmonary Rehab from 04/30/2015 in [place]  Date  04/30/15  Educator  [place]  Instruction Review Code  2- meets goals/outcomes      Energy Conservation: - Provide group verbal and written instruction for methods to conserve energy, plan and organize activities. Instruct on pacing techniques, use of adaptive equipment and posture/positioning to relieve shortness of  breath. Flowsheet Row Pulmonary Rehab from 05/24/2015 in [place]  Date  05/12/15  Educator  SW  Instruction Review Code  2- meets goals/outcomes      Triggers: - Group verbal and written instruction to review types of environmental controls: home humidity, furnaces, filters, dust mite/pet prevention, HEPA vacuums. To discuss weather changes, air quality and the benefits of nasal washing.   Exacerbations: - Group verbal and written instruction to provide: warning signs, infection symptoms, calling MD promptly, preventive modes, and value of vaccinations. Review: effective airway clearance, coughing and/or vibration techniques. Create an Sports administrator. Flowsheet Row Pulmonary Rehab from 05/24/2015 in [place]  Date  05/24/15  Educator  L[place]  Instruction Review Code  2- meets goals/outcomes      Oxygen: - Individual and group verbal and written instruction on oxygen therapy. Includes supplement oxygen, available portable oxygen systems, continuous and intermittent flow rates, oxygen safety, concentrators, and Medicare reimbursement for oxygen. Flowsheet Row Pulmonary Rehab [facility]17/2016 [facility] Pul[facility] Re[facility]  Date  06/27/16  Educator  LB  Instruction Review Code  2- meets goals/outcomes  Respiratory Medications: - Group verbal and written instruction to review medications for lung disease. Drug class, frequency, complications, importance of spacers, rinsing mouth after steroid MDI's, and proper cleaning methods for nebulizers. Flowsheet Row Pulmonary Rehab from 07/17/2016 in [facility] Cardiac and Pulmonary Rehab  Date  06/27/16  Educator  LB  Instruction Review Code  2- meets goals/outcomes      AED/CPR: - Group verbal and written instruction with the use of models to demonstrate the basic use of the AED with the basic ABC's of resuscitation.   Breathing Retraining: - Provides  individuals verbal and written instruction on purpose, frequency, and proper technique of diaphragmatic breathing and pursed-lipped breathing. Applies individual practice skills. Flowsheet Row Pulmonary Rehab from 07/17/2016 in [facility] Cardiac and Pulmonary Rehab  Date  07/17/16  Educator  [facility]  Instruction Review Code  2- meets goals/outcomes      Anatomy and Physiology of the Lungs: - Group verbal and written instruction with the use of models to provide basic lung anatomy and physiology related to function, structure and complications of lung disease.   Heart Failure: - Group verbal and written instruction on the basics of heart failure: signs/symptoms, treatments, explanation of ejection fraction, enlarged heart and cardiomyopathy.   Sleep Apnea: - Individual verbal and written instruction to review Obstructive Sleep Apnea. Review of risk factors, methods for diagnosing and types of masks and machines for OSA.   Anxiety: - Provides group, verbal and written instruction on the correlation between heart/lung disease and anxiety, treatment options, and management of anxiety.   Relaxation: - Provides group, verbal and written instruction about the benefits of relaxation for patients with heart/lung disease. Also provides patients with examples of relaxation techniques. Flowsheet Row Pulmonary Rehab from 05/05/2015 in [place]  Date  05/05/15  Educator  Lucianne Lei LCSW  Instruction Review Code  2- Meets goals/outcomes      Knowledge Questionnaire Score:     Knowledge Questionnaire Score - 06/27/16 1129      Knowledge Questionnaire Score   Pre Score 8/10       Core Components/Risk Factors/Patient Goals at Admission:     Personal Goals and Risk Factors at Admission - 06/27/16 1136      Core Components/Risk Factors/Patient Goals on Admission    Weight Management Yes;Weight Loss   Intervention Weight Management: Develop a combined  nutrition and exercise program designed to reach desired caloric intake, while maintaining appropriate intake of nutrient and fiber, sodium and fats, and appropriate energy expenditure required for the weight goal.;Weight Management: Provide education and appropriate resources to help participant work on and attain dietary goals.   Admit Weight 204 lb 6.4 oz (92.7 kg)   Goal Weight: Short Term 200 lb (90.7 kg)   Goal Weight: Long Term 150 lb (68 kg)   Expected Outcomes Short Term: Continue to assess and modify interventions until short term weight is achieved   Sedentary Yes   Intervention Provide advice, education, support and counseling about physical activity/exercise needs.;Develop an individualized exercise prescription for aerobic and resistive training based on initial evaluation findings, risk stratification, comorbidities and participant's personal goals.  Home stationary exercise bike   Expected Outcomes Achievement of increased cardiorespiratory fitness and enhanced flexibility, muscular endurance and strength shown through measurements of functional capacity and personal statement of participant.   Increase Strength and Stamina Yes   Intervention Provide advice, education, support and counseling about physical activity/exercise needs.;Develop an individualized exercise prescription for aerobic and resistive training  based on initial evaluation findings, risk stratification, comorbidities and participant's personal goals.   Expected Outcomes Achievement of increased cardiorespiratory fitness and enhanced flexibility, muscular endurance and strength shown through measurements of functional capacity and personal statement of participant.   Improve shortness of breath with ADL's Yes   Intervention Provide education, individualized exercise plan and daily activity instruction to help decrease symptoms of SOB with activities of daily living.   Expected Outcomes Short Term: Achieves a reduction of  symptoms when performing activities of daily living.   Develop more efficient breathing techniques such as purse lipped breathing and diaphragmatic breathing; and practicing self-pacing with activity Yes   Intervention Provide education, demonstration and support about specific breathing techniuqes utilized for more efficient breathing. Include techniques such as pursed lipped breathing, diaphragmatic breathing and self-pacing activity.   Expected Outcomes Short Term: Participant will be able to demonstrate and use breathing techniques as needed throughout daily activities.   Increase knowledge of respiratory medications and ability to use respiratory devices properly  Yes   Intervention Provide education and demonstration as needed of appropriate use of medications, inhalers, and oxygen therapy.  Oxygen 2l/m [place]; MDI's: Combivent Respimat, Symbicort; Svn: Albuterol; Uses a spacer   Expected Outcomes Short Term: Achieves understanding of medications use. Understands that oxygen is a medication prescribed by physician. Demonstrates appropriate use of inhaler and oxygen therapy.   Hypertension Yes   Intervention Provide education on lifestyle modifcations including regular physical activity/exercise, weight management, moderate sodium restriction and increased consumption of fresh fruit, vegetables, and low fat dairy, alcohol moderation, and smoking cessation.;Monitor prescription use compliance.   Expected Outcomes Short Term: Continued assessment and intervention until BP is < 140/82m HG in hypertensive participants. < 130/855mHG in hypertensive participants with diabetes, heart failure or chronic kidney disease.;Long Term: Maintenance of blood pressure at goal levels.      Core Components/Risk Factors/Patient Goals Review:      Goals and Risk Factor Review    Row Name 07/17/16 1126 07/21/16 1215           Core Components/Risk Factors/Patient Goals Review   Personal Goals Review Develop more  efficient breathing techniques such as purse lipped breathing and diaphragmatic breathing and practicing self-pacing with activity. Weight Management/Obesity;Hypertension      Review Discussed pursed lip breathing techniques with patient. He stated that he is comfortable using pursed lip breathing and uses it in his daily activities to help control SOB.  Discussed the two risk factors with MiRonalee Beltsoday. He has goal od weight down to 200 lbs short term. At 204 lbs now, Was 210 not to long ago.  He stated that he still eats a variey of foods fried and grilled.  He has deferred an RD appointment. I di remind hiom that the RD is available if he does not see weight change as desired, so he can review his nutrition plan. He is walking at home as well as coming to the prgoram. He is new to the program and expects to see change as he comes to the sessions. Hypertension is controlled at present time with medications nd the exercise he is doing in the program and at home.  He stated he is aware to watch for the amount of sodium intake per day.       Expected Outcomes Patient will continue to use pursed lip breathing during his exercise sessions in order to be able to continue with progression of workloads to increase strengh and stamina.  Weight loss  goal to 200 lbs met prior to discharge. RD inout if no changes noted midway through program.  COntinued compliance with medications , continued exercise for blood pressure control maintenance         Core Components/Risk Factors/Patient Goals at Discharge (Final Review):      Goals and Risk Factor Review - 07/21/16 1215      Core Components/Risk Factors/Patient Goals Review   Personal Goals Review Weight Management/Obesity;Hypertension   Review Discussed the two risk factors with [name] today. He has goal od weight down to 200 lbs short term. At 204 lbs now, Was 210 not to long ago.  He stated that he still eats a variey of foods fried and grilled.  He has deferred an RD  appointment. I di remind hiom that the RD is available if he does not see weight change as desired, so he can review his nutrition plan. He is walking at home as well as coming to the prgoram. He is new to the program and expects to see change as he comes to the sessions. Hypertension is controlled at present time with medications nd the exercise he is doing in the program and at home.  He stated he is aware to watch for the amount of sodium intake per day.    Expected Outcomes Weight loss goal to 200 lbs met prior to discharge. RD inout if no changes noted midway through program.  COntinued compliance with medications , continued exercise for blood pressure control maintenance      ITP Comments:   Comments: 30 Day Review

## 2016-07-26 DIAGNOSIS — J449 Chronic obstructive pulmonary disease, unspecified: Secondary | ICD-10-CM | POA: Diagnosis not present

## 2016-07-26 NOTE — Progress Notes (Signed)
Daily Session Note  Patient Details  Name: Darius Norris MRN: [id number] Date of Birth: [birth year] Referring Provider:    Encounter Date: 07/26/2016  Check In:     Session Check In - 07/26/16 1249      Check-In   Location [facility]   Staff Present Heath Lark, RN, BSN, CCRP;Laureen Owens Shark, BS, RRT, Respiratory Dareen Piano, BA, ACSM CEP, Exercise Physiologist   Supervising physician immediately available to respond to emergencies [name] immediately available ER MD   Physician(s) Marcelene Butte and Jimmye Norman   Medication changes reported     No   Fall or balance concerns reported    No   Warm-up and Cool-down Performed on first and last piece of equipment   Resistance Training Performed Yes   VAD Patient? No     Pain Assessment   Currently in Pain? No/denies   Multiple Pain Sites No         Goals Met:  Proper associated with RPD/PD & O2 Sat Independence with exercise equipment Exercise tolerated well Strength training completed today  Goals Unmet:  Not Applicable  Comments: Pt able to follow exercise prescription today without complaint.  Will continue to monitor for progression.    Dr. Emily Filbert is Medical Director for [place] and [name].

## 2016-07-28 ENCOUNTER — Encounter: Payer: Medicare Other | Admitting: *Deleted

## 2016-07-28 DIAGNOSIS — J449 Chronic obstructive pulmonary disease, unspecified: Secondary | ICD-10-CM

## 2016-07-28 NOTE — Progress Notes (Signed)
Daily Session Note  Patient Details  Name: Darius Norris MRN: [id number] Date of Birth: March 01, 1951 Referring Provider:    Encounter Date: 07/28/2016  Check In:     Session Check In - 07/28/16 1129      Check-In   Staff Present Heath Lark, RN, BSN, CCRP;Laureen Owens Shark, BS, RRT, Respiratory Therapist;Shamicka Inga, RN, BSN   Supervising physician immediately available to respond to emergencies [name] immediately available ER MD   Physician(s) Dr. Jacqualine Code and Dr. Corky Downs   Medication changes reported     No   Fall or balance concerns reported    No   Warm-up and Cool-down Performed on first and last piece of equipment   Resistance Training Performed Yes   VAD Patient? No     Pain Assessment   Currently in Pain? No/denies         Goals Met:  Proper associated with RPD/PD & O2 Sat Independence with exercise equipment Exercise tolerated well Strength training completed today  Goals Unmet:  Not Applicable  Comments: Doing well with exercise progression.   Dr. Emily Filbert is Medical Director for [place] and [name].

## 2016-07-31 ENCOUNTER — Encounter: Payer: Medicare Other | Admitting: *Deleted

## 2016-07-31 DIAGNOSIS — J449 Chronic obstructive pulmonary disease, unspecified: Secondary | ICD-10-CM

## 2016-07-31 NOTE — Progress Notes (Signed)
Daily Session Note  Patient Details  Name: Darius Norris MRN: [id number] Date of Birth: August 06, 1951 Referring Provider:    Encounter Date: 07/31/2016  Check In:     Session Check In - 07/31/16 1031      Check-In   Location [facility]   Staff Present Carson Myrtle, BS, RRT, Respiratory Bertis Ruddy, BS, ACSM CEP, Exercise Physiologist;Rebecca Brayton El, DPT, CEEA   Supervising physician immediately available to respond to emergencies [name] immediately available ER MD   Physician(s) Dr. Bayard Beaver and Dr. Marcelene Butte   Medication changes reported     No   Fall or balance concerns reported    No   Warm-up and Cool-down Performed on first and last piece of equipment   Resistance Training Performed Yes   VAD Patient? No     Pain Assessment   Currently in Pain? No/denies   Multiple Pain Sites No         Goals Met:  Proper associated with RPD/PD & O2 Sat Independence with exercise equipment Exercise tolerated well Strength training completed today  Goals Unmet:  Not Applicable  Comments: Pt able to follow exercise prescription today without complaint.  Will continue to monitor for progression.    Dr. Emily Filbert is Medical Director for [place] and [name].

## 2016-08-02 ENCOUNTER — Encounter: Payer: Medicare Other | Admitting: *Deleted

## 2016-08-02 DIAGNOSIS — J449 Chronic obstructive pulmonary disease, unspecified: Secondary | ICD-10-CM | POA: Diagnosis not present

## 2016-08-02 NOTE — Progress Notes (Signed)
Daily Session Note  Patient Details  Name: Darius Norris MRN: [id number] Date of Birth: 06-30-1951 Referring Provider:    Encounter Date: 08/02/2016  Check In:     Session Check In - 08/02/16 1016      Check-In   Location [facility]   Staff Present Alberteen Sam, MA, ACSM RCEP, Exercise Physiologist;Amanda Oletta Darter, BA, ACSM CEP, Exercise Physiologist;Laureen Owens Shark, BS, RRT, Respiratory Therapist   Supervising physician immediately available to respond to emergencies [name] immediately available ER MD   Physician(s) Drs. Edd Fabian and McShane   Medication changes reported     No   Fall or balance concerns reported    No   Warm-up and Cool-down Performed as group-led Location manager Performed Yes   VAD Patient? No     Pain Assessment   Currently in Pain? No/denies   Multiple Pain Sites No           Exercise Prescription Changes - 08/01/16 1500      Exercise Review   Progression Yes     Response to Exercise   Blood Pressure (Admit) 120/66   Blood Pressure (Exercise) 164/80   Blood Pressure (Exit) 128/68   Heart Rate (Admit) 88 bpm   Heart Rate (Exercise) 87 bpm   Heart Rate (Exit) 72 bpm   Oxygen Saturation (Admit) 98 %   Oxygen Saturation (Exercise) 95 %   Oxygen Saturation (Exit) 96 %   Rating of Perceived Exertion (Exercise) 13   Perceived Dyspnea (Exercise) 4   Comments none   Duration Progress to 45 minutes of aerobic exercise without signs/symptoms of physical distress   Intensity THRR unchanged     Progression   Progression Continue to progress workloads to maintain intensity without signs/symptoms of physical distress.   Average METs 2.03     Resistance Training   Training Prescription Yes   Weight 4 lbs   Reps 10-15     Interval Training   Interval Training No     Oxygen   Oxygen Continuous   Liters 2     Recumbant Bike   Level 1.5   RPM 45   Minutes _0 METs 2.3     REL-XR   Level 3   Minutes 15   METs 1.8     Biostep-RELP   Level 3   Minutes 15   METs 2      Goals Met:  Proper associated with RPD/PD & O2 Sat Independence with exercise equipment Using PLB without cueing & demonstrates good technique Exercise tolerated well Strength training completed today  Goals Unmet:  Not Applicable  Comments: Pt able to follow exercise prescription today without complaint.  Will continue to monitor for progression.  Reviewed home exercise with pt today.  Pt plans to walk and use stationary bike at home for exercise.  Reviewed THR, pulse, RPE, sign and symptoms, and when to call 911 or MD.  Also discussed weather considerations and indoor options.  Pt voiced understanding.   Dr. Emily Filbert is Medical Director for [place] and [name].

## 2016-08-07 ENCOUNTER — Encounter: Payer: Medicare Other | Admitting: *Deleted

## 2016-08-07 DIAGNOSIS — J449 Chronic obstructive pulmonary disease, unspecified: Secondary | ICD-10-CM | POA: Diagnosis not present

## 2016-08-07 NOTE — Progress Notes (Signed)
Daily Session Note  Patient Details  Name: Darius Norris MRN: [id number] Date of Birth: Nov 18, 1951 Referring Provider:    Encounter Date: 08/07/2016  Check In:     Session Check In - 08/07/16 1101      Check-In   Location [facility]   Staff Present Carson Myrtle, BS, RRT, Respiratory Therapist;Aften Lipsey Amedeo Plenty, BS, ACSM CEP, Exercise Physiologist;Jessica Luan Pulling, MA, ACSM RCEP, Exercise Physiologist   Supervising physician immediately available to respond to emergencies [name] immediately available ER MD   Physician(s) Alfred Levins and Jimmye Norman    Medication changes reported     No   Fall or balance concerns reported    No   Warm-up and Cool-down Performed on first and last piece of equipment   Resistance Training Performed Yes   VAD Patient? No     Pain Assessment   Currently in Pain? No/denies   Multiple Pain Sites No         Goals Met:  Proper associated with RPD/PD & O2 Sat Independence with exercise equipment Exercise tolerated well Strength training completed today  Goals Unmet:  Not Applicable  Comments: Pt able to follow exercise prescription today without complaint.  Will continue to monitor for progression.    Dr. Emily Filbert is Medical Director for [place] and [name].

## 2016-08-09 DIAGNOSIS — J449 Chronic obstructive pulmonary disease, unspecified: Secondary | ICD-10-CM | POA: Diagnosis not present

## 2016-08-09 NOTE — Progress Notes (Signed)
Daily Session Note  Patient Details  Name: Darius Norris MRN: [id number] Date of Birth: [birth year] Referring Provider:    Encounter Date: 08/09/2016  Check In:     Session Check In - 08/09/16 1307      Check-In   Location [facility]   Staff Present Nyoka Cowden, RN, BSN, [place], BS, RRT, Respiratory Dareen Piano, BA, ACSM CEP, Exercise Physiologist   Supervising physician immediately available to respond to emergencies [name] immediately available ER MD   Physician(s) Alfred Levins and Jimmye Norman   Medication changes reported     No   Fall or balance concerns reported    No   Warm-up and Cool-down Performed as group-led Location manager Performed Yes   VAD Patient? No     Pain Assessment   Currently in Pain? No/denies         Goals Met:  Proper associated with RPD/PD & O2 Sat Independence with exercise equipment Exercise tolerated well Strength training completed today  Goals Unmet:  Not Applicable  Comments: Pt able to follow exercise prescription today without complaint.  Will continue to monitor for progression.    Dr. Emily Filbert is Medical Director for [place] and [name].

## 2016-08-14 ENCOUNTER — Encounter: Payer: Medicare Other | Admitting: *Deleted

## 2016-08-14 DIAGNOSIS — J449 Chronic obstructive pulmonary disease, unspecified: Secondary | ICD-10-CM

## 2016-08-14 NOTE — Progress Notes (Signed)
Daily Session Note  Patient Details  Name: Darius Norris MRN: [id number] Date of Birth: 10-Jul-1951 Referring Provider:    Encounter Date: 08/14/2016  Check In:     Session Check In - 08/14/16 1017      Check-In   Location [facility]   Staff Present Alberteen Sam, MA, ACSM RCEP, Exercise Physiologist;Kelly Amedeo Plenty, BS, ACSM CEP, Exercise Physiologist;Laureen Owens Shark, BS, RRT, Respiratory Therapist   Supervising physician immediately available to respond to emergencies [name] immediately available ER MD   Physician(s) Drs. Edd Fabian and Yao   Medication changes reported     No   Fall or balance concerns reported    No   Warm-up and Cool-down Performed as group-led Location manager Performed Yes   VAD Patient? No     Pain Assessment   Currently in Pain? No/denies   Multiple Pain Sites No         Goals Met:  Proper associated with RPD/PD & O2 Sat Independence with exercise equipment Using PLB without cueing & demonstrates good technique Exercise tolerated well Strength training completed today  Goals Unmet:  Not Applicable  Comments: Pt able to follow exercise prescription today without complaint.  Will continue to monitor for progression.  After weights, [name] was a little dizzy.  VSS and pt given water.  Pt recovered and able to exercise.  Dr. Emily Filbert is Medical Director for [place] and [name].

## 2016-08-16 ENCOUNTER — Encounter: Payer: Medicare Other | Admitting: *Deleted

## 2016-08-16 DIAGNOSIS — J449 Chronic obstructive pulmonary disease, unspecified: Secondary | ICD-10-CM

## 2016-08-16 NOTE — Progress Notes (Signed)
Daily Session Note  Patient Details  Name: Darius Norris MRN: [id number] Date of Birth: September 29, 1951 Referring Provider:    Encounter Date: 08/16/2016  Check In:     Session Check In - 08/16/16 1508      Check-In   Location [facility]   Staff Present Nyoka Cowden, RN, BSN, Kela Millin, BA, ACSM CEP, Exercise Physiologist;Laureen Janell Quiet, RRT, Respiratory Therapist   Supervising physician immediately available to respond to emergencies [name] immediately available ER MD   Physician(s) Drs. Karma Greaser and Alfred Levins   Medication changes reported     No   Fall or balance concerns reported    No   Warm-up and Cool-down Performed as group-led Location manager Performed Yes   VAD Patient? No     Pain Assessment   Currently in Pain? No/denies   Multiple Pain Sites No         Goals Met:  Proper associated with RPD/PD & O2 Sat Independence with exercise equipment Using PLB without cueing & demonstrates good technique Exercise tolerated well Strength training completed today  Goals Unmet:  Not Applicable  Comments: Pt able to follow exercise prescription today without complaint.  Will continue to monitor for progression.    Dr. Emily Filbert is Medical Director for [place] and [name].

## 2016-08-18 ENCOUNTER — Encounter: Payer: Non-veteran care | Attending: Internal Medicine

## 2016-08-18 DIAGNOSIS — J449 Chronic obstructive pulmonary disease, unspecified: Secondary | ICD-10-CM | POA: Insufficient documentation

## 2016-08-22 ENCOUNTER — Encounter: Payer: Self-pay | Admitting: *Deleted

## 2016-08-22 DIAGNOSIS — J449 Chronic obstructive pulmonary disease, unspecified: Secondary | ICD-10-CM

## 2016-08-22 NOTE — Progress Notes (Signed)
Pulmonary Individual Treatment Plan  Patient Details  Name: Darius Norris MRN: [id number] Date of Birth: Jul 27, 1951 Referring Provider:    Initial Encounter Date:  Flowsheet Row Pulmonary Rehab from 06/27/2016 in [facility] Cardiac and Pulmonary Rehab  Date  06/27/16      Visit Diagnosis: Moderate COPD (chronic obstructive pulmonary disease) ([place])  Patient's Home Medications on Admission:  Current Outpatient Prescriptions:  .  albuterol (PROVENTIL HFA;VENTOLIN HFA) 108 (90 Base) MCG/ACT inhaler, Inhale 2 puffs into the lungs every 6 (six) hours as needed for wheezing or shortness of breath., Disp: 1 Inhaler, Rfl: 0 .  amitriptyline (ELAVIL) 10 MG tablet, Take 30 mg by mouth at bedtime., Disp: , Rfl:  .  budesonide-formoterol (SYMBICORT) 160-4.5 MCG/ACT inhaler, Inhale 2 puffs into the lungs 2 (two) times daily., Disp: , Rfl:  .  enalapril (VASOTEC) 2.5 MG tablet, Take 1 tablet (2.5 mg total) by mouth daily., Disp: 30 tablet, Rfl: 0 .  gabapentin (NEURONTIN) 300 MG capsule, Take 300 mg by mouth 3 (three) times daily., Disp: , Rfl:  .  Ipratropium-Albuterol (COMBIVENT RESPIMAT) 20-100 MCG/ACT AERS respimat, Inhale 1 puff into the lungs every 6 (six) hours as needed for wheezing or shortness of breath., Disp: , Rfl:  .  isosorbide mononitrate (IMDUR) 60 MG 24 hr tablet, Take 60 mg by mouth daily., Disp: , Rfl:  .  levofloxacin (LEVAQUIN) 750 MG tablet, Take 1 tablet (750 mg total) by mouth daily., Disp: 4 tablet, Rfl: 0 .  metoprolol (LOPRESSOR) 50 MG tablet, Take 100 mg by mouth 2 (two) times daily., Disp: , Rfl:  .  OMEPRAZOLE PO, Take 1 capsule by mouth daily., Disp: , Rfl:  .  predniSONE (DELTASONE) 10 MG tablet, 50 mg daily for 4 more days, Disp: 20 tablet, Rfl: 0  Past Medical History: Past Medical History:  Diagnosis Date  . CAD (coronary artery disease)   . Colitis   . COPD (chronic obstructive pulmonary disease) ([place])   . Emphysema lung ([place])   . GERD (gastroesophageal reflux  disease)   . Hypertension     Tobacco Use: History  Smoking Status  . Former Smoker  Smokeless Tobacco  . Never Used    Labs: Recent [employer] for ITP Cardiac and Pulmonary Rehab Latest Ref Rng & Units 02/04/2013   Cholestrol 0 - 200 mg/dL 185   LDLCALC 0 - 100 mg/dL 114(H)   HDL 40 - 60 mg/dL 38(L)   Trlycerides 0 - 200 mg/dL 166       ADL UCSD:     Pulmonary Assessment Scores    Row Name 06/27/16 1131         ADL UCSD   ADL Phase Entry     SOB Score total 50     Rest 1     Walk 3     Stairs 2     Bath 4     Dress 2     Shop 2        Pulmonary Function Assessment:     Pulmonary Function Assessment - 06/27/16 1129      Initial Spirometry Results   FVC% 40 %   FEV1% 34 %   FEV1/FVC Ratio 65.59     Post Bronchodilator Spirometry Results   FVC% 49 %   FEV1% 24 %   FEV1/FVC Ratio 56.11     Breath   Shortness of Breath Yes;Fear of Shortness of Breath;Limiting activity      Exercise  Target Goals:    Exercise Program Goal: Individual exercise prescription set with THRR, safety & activity barriers. Participant demonstrates ability to understand and report RPE using BORG scale, to self-measure pulse accurately, and to acknowledge the importance of the exercise prescription.  Exercise Prescription Goal: Starting with aerobic activity 30 plus minutes a day, 3 days per week for initial exercise prescription. Provide home exercise prescription and guidelines that participant acknowledges understanding prior to discharge.  Activity Barriers & Risk Stratification:     Activity Barriers & Cardiac Risk Stratification - 06/27/16 1128      Activity Barriers & Cardiac Risk Stratification   Activity Barriers Shortness of Breath;Deconditioning   Cardiac Risk Stratification Moderate      6 Minute Walk:     6 Minute Walk    Row Name 06/27/16 1034         6 Minute Walk   Distance 500 feet     Walk Time 4 minutes     MPH 1.42      METS 1.6     RPE 15     Perceived Dyspnea  4     VO2 Peak 5.5     Symptoms Yes (comment)     Comments low back pain more than breathing     Resting HR 69 bpm     Resting BP 138/74     Max Ex. HR 87 bpm     Max Ex. BP 152/72        Initial Exercise Prescription:     Initial Exercise Prescription - 06/27/16 1000      Date of Initial Exercise RX and Referring Provider   Date 06/27/16     Oxygen   Oxygen Continuous   Liters 2     Recumbant Bike   Level 1   RPM 60   Minutes 15   METs 1.5     NuStep   Level 1   Minutes 15   METs 1.5     REL-XR   Level 1   Minutes 15   METs 1.5     T5 Nustep   Level 1   Minutes 15   METs 1.5     Biostep-RELP   Level 1   Minutes 15   METs 1.5     Prescription Details   Frequency (times per week) 3   Duration Progress to 45 minutes of aerobic exercise without signs/symptoms of physical distress     Intensity   THRR 40-80% of Max Heartrate 103-139   Ratings of Perceived Exertion 11-15   Perceived Dyspnea 2-4     Progression   Progression Continue to progress workloads to maintain intensity without signs/symptoms of physical distress.     Resistance Training   Training Prescription Yes   Weight 3   Reps 10-15      Perform Capillary Blood Glucose checks as needed.  Exercise Prescription Changes:     Exercise Prescription Changes    Row Name 07/17/16 1100 07/19/16 1400 08/01/16 1500 08/02/16 1400 08/16/16 1500     Exercise Review   Progression  - Yes Yes Yes Yes     Response to Exercise   Blood Pressure (Admit) 140/78 120/70 120/66  - 138/68   Blood Pressure (Exercise) 132/70 164/76 164/80  - 152/70   Blood Pressure (Exit) 138/70 130/64 128/68  - 122/64   Heart Rate (Admit) 78 bpm 73 bpm 88 bpm  - 82 bpm   Heart Rate (Exercise) 101 bpm 84 bpm 87  bpm  - 77 bpm   Heart Rate (Exit) 87 bpm 74 bpm 72 bpm  - 69 bpm   Oxygen Saturation (Admit) 96 % 92 % 98 %  - 96 %   Oxygen Saturation (Exercise) 92 % 91 % 95 %  -  94 %   Oxygen Saturation (Exit) 95 % 95 % 96 %  - 96 %   Rating of Perceived Exertion (Exercise) _0 - 12   Perceived Dyspnea (Exercise) _1 - 33   Symptoms  -  -  - Home Exercise Guidelines given 08/02/16 Home Exercise Guidelines given 08/02/16   Comments  -  - none none none   Duration Progress to 30 minutes of continuous aerobic without signs/symptoms of physical distress Progress to 45 minutes of aerobic exercise without signs/symptoms of physical distress Progress to 45 minutes of aerobic exercise without signs/symptoms of physical distress Progress to 45 minutes of aerobic exercise without signs/symptoms of physical distress Progress to 45 minutes of aerobic exercise without signs/symptoms of physical distress   Intensity THRR unchanged  103-139 THRR unchanged THRR unchanged THRR unchanged THRR unchanged     Progression   Progression Continue progressive overload as per policy without signs/symptoms or physical distress. Continue to progress workloads to maintain intensity without signs/symptoms of physical distress. Continue to progress workloads to maintain intensity without signs/symptoms of physical distress. Continue to progress workloads to maintain intensity without signs/symptoms of physical distress. Continue to progress workloads to maintain intensity without signs/symptoms of physical distress.   Average METs 1.9 2 2.03 2.03 2.23     Resistance Training   Training Prescription _2    Weight _3 lbs 4 lbs 4 lbs   Reps 10-15 10-15 10-15 10-15 10-15     Interval Training   Interval Training  - No No No No     Oxygen   Oxygen _4    Liters _5 Recumbant Bike   Level 1 -  omitted due to time today 1.5 1.5 1.5   RPM 30  - 45 45 42   Minutes 6  3-3 intervals  - _6 METs 1.5  - 2.3 2.3 2.3     REL-XR   Level _7 Minutes _8 METs 1.9 2 1.8 1.8 2.4      Biostep-RELP   Level _9 Minutes _10 METs _11 Home Exercise Plan   Plans to continue exercise at  -  -  - Home  walking and stationary bike Home  walking and stationary bike   Frequency  -  -  - Add 1 additional day to program exercise sessions. Add 1 additional day to program exercise sessions.      Exercise Comments:     Exercise Comments    Row Name 07/17/16 1136 07/19/16 1442 08/01/16 1535 08/02/16 1118 08/16/16 1509   Exercise Comments Today was the patient's first day of class. The patient's initial exercise prescription (based on the 6 min walk evaluation) was reviewed with the patient. Darius Norris is off to a good start with exercise.  He had now done two sessions.  He already made some improvement.  We will continue to  monitor for improvement. Darius Norris continues to do well with exercise. He is now up to 8 minutes on the recumbent bike continuosly.  We wil continue to monitor for progression. Reviewed home exercise with pt today.  Pt plans to walk and use stationary bike at home for exercise.  Reviewed THR, pulse, RPE, sign and symptoms, and when to call 911 or MD.  Also discussed weather considerations and indoor options.  Pt voiced understanding. Darius Norris is doing well with his exercise.  We are still working on building up time on the recumbent bike.  We will continue to monitor for progression.      Discharge Exercise Prescription (Final Exercise Prescription Changes):     Exercise Prescription Changes - 08/16/16 1500      Exercise Review   Progression Yes     Response to Exercise   Blood Pressure (Admit) 138/68   Blood Pressure (Exercise) 152/70   Blood Pressure (Exit) 122/64   Heart Rate (Admit) 82 bpm   Heart Rate (Exercise) 77 bpm   Heart Rate (Exit) 69 bpm   Oxygen Saturation (Admit) 96 %   Oxygen Saturation (Exercise) 94 %   Oxygen Saturation (Exit) 96 %   Rating of Perceived Exertion (Exercise) 12   Perceived Dyspnea (Exercise) 33    Symptoms Home Exercise Guidelines given 08/02/16   Comments none   Duration Progress to 45 minutes of aerobic exercise without signs/symptoms of physical distress   Intensity THRR unchanged     Progression   Progression Continue to progress workloads to maintain intensity without signs/symptoms of physical distress.   Average METs 2.23     Resistance Training   Training Prescription Yes   Weight 4 lbs   Reps 10-15     Interval Training   Interval Training No     Oxygen   Oxygen Continuous   Liters 2     Recumbant Bike   Level 1.5   RPM 42   Minutes 11   METs 2.3     REL-XR   Level 3   Minutes 15   METs 2.4     Biostep-RELP   Level 3   Minutes 15   METs 2     Home Exercise Plan   Plans to continue exercise at Home  walking and stationary bike   Frequency Add 1 additional day to program exercise sessions.       Nutrition:  Target Goals: Understanding of nutrition guidelines, daily intake of sodium <1575m, cholesterol <2065m calories 30% from fat and 7% or less from saturated fats, daily to have 5 or more servings of fruits and vegetables.  Biometrics:     Pre Biometrics - 06/27/16 1034      Pre Biometrics   Height _0  (1.676 m)   Weight 204 lb 6.4 oz (92.7 kg)   Waist Circumference 49 inches   Hip Circumference 48 inches   Waist to Hip Ratio 1.02 %   BMI (Calculated) 33.1       Nutrition Therapy Plan and Nutrition Goals:   Nutrition Discharge: Rate Your Plate Scores:   Psychosocial: Target Goals: Acknowledge presence or absence of depression, maximize coping skills, provide positive support system. Participant is able to verbalize types and ability to use techniques and skills needed for reducing stress and depression.  Initial Review & Psychosocial Screening:     Initial Psych Review & Screening - 06/27/16 [id number]   Comments  Mr Marcucci has good support from his wife. He admits to panic  attacks with shortness of breath and has recently been set-up with a therapist for counciling. He states that this treatment will help along with [name] exercise with suppervision.     Barriers   Psychosocial barriers to participate in program The patient should benefit from training in stress management and relaxation.     Screening Interventions   Interventions Encouraged to exercise;Program counselor consult      Quality of Life Scores:     Quality of Life - 06/27/16 1145      Quality of Life Scores   Health/Function Pre 19.5 %   Socioeconomic Pre 19.17 %   Psych/Spiritual Pre 19 %   Family Pre 20.6 %   GLOBAL Pre 19.5 %      PHQ-9: Recent Review Flowsheet Data    Depression screen [facility] 2/9 06/27/2016   Decreased Interest 0   Down, Depressed, Hopeless 0   PHQ - 2 Score 0   Altered sleeping 1   Tired, decreased energy 2   Change in appetite 1   Feeling bad or failure about yourself  0   Trouble concentrating 0   Moving slowly or fidgety/restless 0   Suicidal thoughts 0   PHQ-9 Score 4   Difficult doing work/chores Somewhat difficult      Psychosocial Evaluation and Intervention:     Psychosocial Evaluation - 07/17/16 1058      Psychosocial Evaluation & Interventions   Comments Counselor met with Mr. Cooler today for initial psychosocial evaluation.   He was in this program approximately 1 year ago and reports having a "bad episode" about a month ago which resulted in his Dr. recommending a return to Pulmonary Rehab.  Mr. Cuffe has COPD and is otherwise in fairly good health.  He has a spouse of 44 years, an adult daughter and several sisters who live close by and are his strong support system.  Mr. Scheel states he sleeps okay with the help of OTC Melatonin at night as needed.  His appetite is good as well.  Mr. Rote reports a recent history of panic attacks resulting in his psychiatrist adding a medication for this which he states is Buspiron @ 37m.  This  counselor did not see this medication on Mr. Searles's list and asked him to bring it to next class for a nurse to add it to the medication list.  He states this has been helpful in addressing these symptoms.  Mr. RDaubertreports his mood is "okay" and his health is his primary stress at this time.  Mr. RArzuagahas goals to lose weight; breathe better; and increase his stamina while in this program.  Counselor encouraged Mr. RMazariegoto begin considering how he will maintain exercising consistently upon completion of this program and he agreed to work on this as a goal as well.        Psychosocial Re-Evaluation:     Psychosocial Re-Evaluation    RJacksonName 07/31/16 1136             Psychosocial Re-Evaluation   Comments Counselor follow up with Mr. RReevestoday with him reporting he has noticed improvement in his stamina and endurance and able to do more normal activities since coming back into this program.  He is sleeping better with the help of OTC natural sleep aid and reports that his panic symptoms has decreased as well with exercise and the new medications.  Counselor commended  Mr. R for his progress and commitment to consistency in exercise.           Education: Education Goals: Education classes will be provided on a weekly basis, covering required topics. Participant will state understanding/return demonstration of topics presented.  Learning Barriers/Preferences:     Learning Barriers/Preferences - 06/27/16 1128      Learning Barriers/Preferences   Learning Barriers None   Learning Preferences Group Instruction;Individual Instruction;Pictoral;Skilled Demonstration;Verbal Instruction;Video;Written Material      Education Topics: Initial Evaluation Education: - Verbal, written and demonstration of respiratory meds, RPE/PD scales, oximetry and breathing techniques. Instruction on use of nebulizers and MDIs: cleaning and proper use, rinsing mouth with steroid doses and  importance of monitoring MDI activations. Flowsheet Row Pulmonary Rehab from 07/17/2016 in [facility] Cardiac and Pulmonary Rehab  Date  07/17/16  Educator  [facility]  Instruction Review Code  2- meets goals/outcomes      General Nutrition Guidelines/Fats and Fiber: -Group instruction provided by verbal, written material, models and posters to present the general guidelines for heart healthy nutrition. Gives an explanation and review of dietary fats and fiber.   Controlling Sodium/Reading Food Labels: -Group verbal and written material supporting the discussion of sodium use in heart healthy nutrition. Review and explanation with models, verbal and written materials for utilization of the food label.   Exercise Physiology & Risk Factors: - Group verbal and written instruction with models to review the exercise physiology of the cardiovascular system and associated critical values. Details cardiovascular disease risk factors and the goals associated with each risk factor.   Aerobic Exercise & Resistance Training: - Gives group verbal and written discussion on the health impact of inactivity. On the components of aerobic and resistive training programs and the benefits of this training and how to safely progress through these programs. Flowsheet Row Pulmonary Rehab from 04/21/2015 in [place]  Date  04/21/15  Educator  [place]  Instruction Review Code  2- meets goals/outcomes      Flexibility, Balance, General Exercise Guidelines: - Provides group verbal and written instruction on the benefits of flexibility and balance training programs. Provides general exercise guidelines with specific guidelines to those with heart or lung disease. Demonstration and skill practice provided. Flowsheet Row Pulmonary Rehab from 04/21/2015 in [place]  Date  04/21/15  Educator  [place]  Instruction Review Code  2- meets goals/outcomes       Stress Management: - Provides group verbal and written instruction about the health risks of elevated stress, cause of high stress, and healthy ways to reduce stress.   Depression: - Provides group verbal and written instruction on the correlation between heart/lung disease and depressed mood, treatment options, and the stigmas associated with seeking treatment.   Exercise & Equipment Safety: - Individual verbal instruction and demonstration of equipment use and safety with use of the equipment. Flowsheet Row Pulmonary Rehab from 07/17/2016 in [facility] Cardiac and Pulmonary Rehab  Date  07/17/16  Educator  [facility]  Instruction Review Code  2- meets goals/outcomes      Infection Prevention: - Provides verbal and written material to individual with discussion of infection control including proper hand washing and proper equipment cleaning during exercise session. Flowsheet Row Pulmonary Rehab from 07/17/2016 in [facility] Cardiac and Pulmonary Rehab  Date  07/17/16  Educator  [facility]  Instruction Review Code  2- meets goals/outcomes      Falls Prevention: - Provides verbal and written material to  individual with discussion of falls prevention and safety. Flowsheet Row Pulmonary Rehab from 07/17/2016 in [facility] Cardiac and Pulmonary Rehab  Date  06/27/16  Educator  [facility]  Instruction Review Code  2- meets goals/outcomes      Diabetes: - Individual verbal and written instruction to review signs/symptoms of diabetes, desired ranges of glucose level fasting, after meals and with exercise. Advice that pre and post exercise glucose checks will be done for 3 sessions at entry of program.   Chronic Lung Diseases: - Group verbal and written instruction to review new updates, new respiratory medications, new advancements in procedures and treatments. Provide informative websites and "800" numbers of self-education. Flowsheet Row Pulmonary Rehab from 04/26/2015 in [place]  Date  04/26/15  Educator  [facility]  Instruction Review Code  2- meets goals/outcomes      Lung Procedures: - Group verbal and written instruction to describe testing methods done to diagnose lung disease. Review the outcome of test results. Describe the treatment choices: Pulmonary Function Tests, ABGs and oximetry. Flowsheet Row Pulmonary Rehab from 04/30/2015 in [place]  Date  04/30/15  Educator  [place]  Instruction Review Code  2- meets goals/outcomes      Energy Conservation: - Provide group verbal and written instruction for methods to conserve energy, plan and organize activities. Instruct on pacing techniques, use of adaptive equipment and posture/positioning to relieve shortness of breath. Flowsheet Row Pulmonary Rehab from 05/24/2015 in [place]  Date  05/12/15  Educator  SW  Instruction Review Code  2- meets goals/outcomes      Triggers: - Group verbal and written instruction to review types of environmental controls: home humidity, furnaces, filters, dust mite/pet prevention, HEPA vacuums. To discuss weather changes, air quality and the benefits of nasal washing.   Exacerbations: - Group verbal and written instruction to provide: warning signs, infection symptoms, calling MD promptly, preventive modes, and value of vaccinations. Review: effective airway clearance, coughing and/or vibration techniques. Create an Sports administrator. Flowsheet Row Pulmonary Rehab from 05/24/2015 in [place]  Date  05/24/15  Educator  [facility]  Instruction Review Code  2- meets goals/outcomes      Oxygen: - Individual and group verbal and written instruction on oxygen therapy. Includes supplement oxygen, available portable oxygen systems, continuous and intermittent flow rates, oxygen safety, concentrators, and Medicare reimbursement for oxygen. Flowsheet Row Pulmonary Rehab from  07/17/2016 in [facility] Cardiac and Pulmonary Rehab  Date  06/27/16  Educator  [facility]  Instruction Review Code  2- meets goals/outcomes      Respiratory Medications: - Group verbal and written instruction to review medications for lung disease. Drug class, frequency, complications, importance of spacers, rinsing mouth after steroid MDI's, and proper cleaning methods for nebulizers. Flowsheet Row Pulmonary Rehab from 07/17/2016 in [facility] Cardiac and Pulmonary Rehab  Date  06/27/16  Educator  [facility]  Instruction Review Code  2- meets goals/outcomes      AED/CPR: - Group verbal and written instruction with the use of models to demonstrate the basic use of the AED with the basic ABC's of resuscitation.   Breathing Retraining: - Provides individuals verbal and written instruction on purpose, frequency, and proper technique of diaphragmatic breathing and pursed-lipped breathing. Applies individual practice skills. Flowsheet Row Pulmonary Rehab from 07/17/2016 in [facility] Cardiac and Pulmonary Rehab  Date  07/17/16  Educator  [facility]  Instruction Review Code  2- meets goals/outcomes  Anatomy and Physiology of the Lungs: - Group verbal and written instruction with the use of models to provide basic lung anatomy and physiology related to function, structure and complications of lung disease.   Heart Failure: - Group verbal and written instruction on the basics of heart failure: signs/symptoms, treatments, explanation of ejection fraction, enlarged heart and cardiomyopathy.   Sleep Apnea: - Individual verbal and written instruction to review Obstructive Sleep Apnea. Review of risk factors, methods for diagnosing and types of masks and machines for OSA.   Anxiety: - Provides group, verbal and written instruction on the correlation between heart/lung disease and anxiety, treatment options, and management of anxiety.   Relaxation: - Provides group, verbal and written instruction about the benefits of  relaxation for patients with heart/lung disease. Also provides patients with examples of relaxation techniques. Flowsheet Row Pulmonary Rehab from 05/05/2015 in [place]  Date  05/05/15  Educator  Lucianne Lei LCSW  Instruction Review Code  2- Meets goals/outcomes      Knowledge Questionnaire Score:     Knowledge Questionnaire Score - 06/27/16 1129      Knowledge Questionnaire Score   Pre Score 8/10       Core Components/Risk Factors/Patient Goals at Admission:     Personal Goals and Risk Factors at Admission - 06/27/16 1136      Core Components/Risk Factors/Patient Goals on Admission    Weight Management Yes;Weight Loss   Intervention Weight Management: Develop a combined nutrition and exercise program designed to reach desired caloric intake, while maintaining appropriate intake of nutrient and fiber, sodium and fats, and appropriate energy expenditure required for the weight goal.;Weight Management: Provide education and appropriate resources to help participant work on and attain dietary goals.   Admit Weight 204 lb 6.4 oz (92.7 kg)   Goal Weight: Short Term 200 lb (90.7 kg)   Goal Weight: Long Term 150 lb (68 kg)   Expected Outcomes Short Term: Continue to assess and modify interventions until short term weight is achieved   Sedentary Yes   Intervention Provide advice, education, support and counseling about physical activity/exercise needs.;Develop an individualized exercise prescription for aerobic and resistive training based on initial evaluation findings, risk stratification, comorbidities and participant's personal goals.  Home stationary exercise bike   Expected Outcomes Achievement of increased cardiorespiratory fitness and enhanced flexibility, muscular endurance and strength shown through measurements of functional capacity and personal statement of participant.   Increase Strength and Stamina Yes   Intervention Provide advice,  education, support and counseling about physical activity/exercise needs.;Develop an individualized exercise prescription for aerobic and resistive training based on initial evaluation findings, risk stratification, comorbidities and participant's personal goals.   Expected Outcomes Achievement of increased cardiorespiratory fitness and enhanced flexibility, muscular endurance and strength shown through measurements of functional capacity and personal statement of participant.   Improve shortness of breath with ADL's Yes   Intervention Provide education, individualized exercise plan and daily activity instruction to help decrease symptoms of SOB with activities of daily living.   Expected Outcomes Short Term: Achieves a reduction of symptoms when performing activities of daily living.   Develop more efficient breathing techniques such as purse lipped breathing and diaphragmatic breathing; and practicing self-pacing with activity Yes   Intervention Provide education, demonstration and support about specific breathing techniuqes utilized for more efficient breathing. Include techniques such as pursed lipped breathing, diaphragmatic breathing and self-pacing activity.   Expected Outcomes Short Term: Participant will be able to demonstrate and  use breathing techniques as needed throughout daily activities.   Increase knowledge of respiratory medications and ability to use respiratory devices properly  Yes   Intervention Provide education and demonstration as needed of appropriate use of medications, inhalers, and oxygen therapy.  Oxygen 2l/m [place]; MDI's: Combivent Respimat, Symbicort; Svn: Albuterol; Uses a spacer   Expected Outcomes Short Term: Achieves understanding of medications use. Understands that oxygen is a medication prescribed by physician. Demonstrates appropriate use of inhaler and oxygen therapy.   Hypertension Yes   Intervention Provide education on lifestyle modifcations including regular  physical activity/exercise, weight management, moderate sodium restriction and increased consumption of fresh fruit, vegetables, and low fat dairy, alcohol moderation, and smoking cessation.;Monitor prescription use compliance.   Expected Outcomes Short Term: Continued assessment and intervention until BP is < 140/79m HG in hypertensive participants. < 130/870mHG in hypertensive participants with diabetes, heart failure or chronic kidney disease.;Long Term: Maintenance of blood pressure at goal levels.      Core Components/Risk Factors/Patient Goals Review:      Goals and Risk Factor Review    Row Name 07/17/16 1126 07/21/16 1215 08/02/16 1158         Core Components/Risk Factors/Patient Goals Review   Personal Goals Review Develop more efficient breathing techniques such as purse lipped breathing and diaphragmatic breathing and practicing self-pacing with activity. Weight Management/Obesity;Hypertension Increase knowledge of respiratory medications and ability to use respiratory devices properly.;Improve shortness of breath with ADL's;Sedentary;Develop more efficient breathing techniques such as purse lipped breathing and diaphragmatic breathing and practicing self-pacing with activity.     Review Discussed pursed lip breathing techniques with patient. He stated that he is comfortable using pursed lip breathing and uses it in his daily activities to help control SOB.  Discussed the two risk factors with MiRonalee Beltsoday. He has goal od weight down to 200 lbs short term. At 204 lbs now, Was 210 not to long ago.  He stated that he still eats a variey of foods fried and grilled.  He has deferred an RD appointment. I di remind hiom that the RD is available if he does not see weight change as desired, so he can review his nutrition plan. He is walking at home as well as coming to the prgoram. He is new to the program and expects to see change as he comes to the sessions. Hypertension is controlled at present  time with medications nd the exercise he is doing in the program and at home.  He stated he is aware to watch for the amount of sodium intake per day.  Mr RiMalteroticed a difference walking into [name] today - easier and less shortness of breath. He is walking around the house more and plans to start using his stationary bike at home. He has good technique with his PLB.     Expected Outcomes Patient will continue to use pursed lip breathing during his exercise sessions in order to be able to continue with progression of workloads to increase strengh and stamina.  Weight loss goal to 200 lbs met prior to discharge. RD inout if no changes noted midway through program.  COntinued compliance with medications , continued exercise for blood pressure control maintenance  -        Core Components/Risk Factors/Patient Goals at Discharge (Final Review):      Goals and Risk Factor Review - 08/02/16 1158      Core Components/Risk Factors/Patient Goals Review   Personal Goals Review Increase knowledge of respiratory  medications and ability to use respiratory devices properly.;Improve shortness of breath with ADL's;Sedentary;Develop more efficient breathing techniques such as purse lipped breathing and diaphragmatic breathing and practicing self-pacing with activity.   Review Mr Jenkinson noticed a difference walking into [name] today - easier and less shortness of breath. He is walking around the house more and plans to start using his stationary bike at home. He has good technique with his PLB.      ITP Comments:   Comments: 30 Day Review

## 2016-08-23 ENCOUNTER — Encounter: Payer: Non-veteran care | Admitting: *Deleted

## 2016-08-23 DIAGNOSIS — J449 Chronic obstructive pulmonary disease, unspecified: Secondary | ICD-10-CM

## 2016-08-23 NOTE — Progress Notes (Signed)
Daily Session Note  Patient Details  Name: Darius Norris MRN: [id number] Date of Birth: June 19, 1951 Referring Provider:    Encounter Date: 08/23/2016  Check In:     Session Check In - 08/23/16 1143      Check-In   Location [facility]   Staff Present Alberteen Sam, MA, ACSM RCEP, Exercise Physiologist;Amanda Oletta Darter, BA, ACSM CEP, Exercise Physiologist;Laureen Janell Quiet, RRT, Respiratory Therapist   Supervising physician immediately available to respond to emergencies [name] immediately available ER MD   Physician(s) Drs. Malinda and Williams   Medication changes reported     No   Fall or balance concerns reported    No   Warm-up and Cool-down Performed as group-led Location manager Performed Yes   VAD Patient? No     Pain Assessment   Currently in Pain? No/denies   Multiple Pain Sites No         Goals Met:  Proper associated with RPD/PD & O2 Sat Independence with exercise equipment Using PLB without cueing & demonstrates good technique Exercise tolerated well Strength training completed today  Goals Unmet:  Not Applicable  Comments: Pt able to follow exercise prescription today without complaint.  Will continue to monitor for progression.    Dr. Emily Filbert is Medical Director for [place] and [name].

## 2016-08-28 ENCOUNTER — Encounter: Payer: Non-veteran care | Admitting: *Deleted

## 2016-08-28 DIAGNOSIS — J449 Chronic obstructive pulmonary disease, unspecified: Secondary | ICD-10-CM

## 2016-08-28 NOTE — Progress Notes (Signed)
Daily Session Note  Patient Details  Name: Darius Norris MRN: [id number] Date of Birth: 08-26-1951 Referring Provider:    Encounter Date: 08/28/2016  Check In:     Session Check In - 08/28/16 1012      Check-In   Location [facility]   Staff Present Carson Myrtle, BS, RRT, Respiratory Bertis Ruddy, BS, ACSM CEP, Exercise Physiologist;Amanda Oletta Darter, BA, ACSM CEP, Exercise Physiologist   Supervising physician immediately available to respond to emergencies [name] immediately available ER MD   Physician(s) Dr. Quentin Cornwall and Dr. Jimmye Norman   Medication changes reported     No   Fall or balance concerns reported    No   Warm-up and Cool-down Performed on first and last piece of equipment   Resistance Training Performed Yes   VAD Patient? No     Pain Assessment   Currently in Pain? No/denies         Goals Met:  Proper associated with RPD/PD & O2 Sat Independence with exercise equipment Exercise tolerated well Strength training completed today  Goals Unmet:  Not Applicable  Comments: Pt able to follow exercise prescription today without complaint.  Will continue to monitor for progression.    Dr. Emily Filbert is Medical Director for [place] and [name].

## 2016-08-30 ENCOUNTER — Telehealth: Payer: Self-pay | Admitting: *Deleted

## 2016-08-30 ENCOUNTER — Encounter: Payer: Self-pay | Admitting: *Deleted

## 2016-08-30 NOTE — Telephone Encounter (Signed)
[name] called to say he was sorry that he can't be in [facility] today since he has a cold. He hopes to return on Monday.

## 2016-09-04 ENCOUNTER — Encounter: Payer: Self-pay | Admitting: *Deleted

## 2016-09-04 ENCOUNTER — Other Ambulatory Visit: Payer: Self-pay

## 2016-09-04 ENCOUNTER — Emergency Department: Payer: Medicare Other

## 2016-09-04 ENCOUNTER — Observation Stay
Admission: EM | Admit: 2016-09-04 | Discharge: 2016-09-05 | Disposition: A | Discharge status: Home / Self Care | Payer: Medicare Other | Attending: Internal Medicine | Admitting: Internal Medicine

## 2016-09-04 DIAGNOSIS — R918 Other nonspecific abnormal finding of lung field: Secondary | ICD-10-CM | POA: Diagnosis not present

## 2016-09-04 DIAGNOSIS — F419 Anxiety disorder, unspecified: Secondary | ICD-10-CM | POA: Diagnosis not present

## 2016-09-04 DIAGNOSIS — J441 Chronic obstructive pulmonary disease with (acute) exacerbation: Secondary | ICD-10-CM

## 2016-09-04 DIAGNOSIS — D72829 Elevated white blood cell count, unspecified: Secondary | ICD-10-CM | POA: Diagnosis not present

## 2016-09-04 DIAGNOSIS — Z885 Allergy status to narcotic agent status: Secondary | ICD-10-CM | POA: Insufficient documentation

## 2016-09-04 DIAGNOSIS — Z881 Allergy status to other antibiotic agents status: Secondary | ICD-10-CM | POA: Diagnosis not present

## 2016-09-04 DIAGNOSIS — R079 Chest pain, unspecified: Secondary | ICD-10-CM | POA: Diagnosis present

## 2016-09-04 DIAGNOSIS — R9431 Abnormal electrocardiogram [ECG] [EKG]: Secondary | ICD-10-CM

## 2016-09-04 DIAGNOSIS — R42 Dizziness and giddiness: Secondary | ICD-10-CM | POA: Diagnosis not present

## 2016-09-04 DIAGNOSIS — I493 Ventricular premature depolarization: Secondary | ICD-10-CM | POA: Diagnosis not present

## 2016-09-04 DIAGNOSIS — T380X5A Adverse effect of glucocorticoids and synthetic analogues, initial encounter: Secondary | ICD-10-CM | POA: Diagnosis not present

## 2016-09-04 DIAGNOSIS — Z6833 Body mass index (BMI) 33.0-33.9, adult: Secondary | ICD-10-CM | POA: Insufficient documentation

## 2016-09-04 DIAGNOSIS — R778 Other specified abnormalities of plasma proteins: Secondary | ICD-10-CM | POA: Insufficient documentation

## 2016-09-04 DIAGNOSIS — I25110 Atherosclerotic heart disease of native coronary artery with unstable angina pectoris: Secondary | ICD-10-CM | POA: Diagnosis not present

## 2016-09-04 DIAGNOSIS — E876 Hypokalemia: Secondary | ICD-10-CM

## 2016-09-04 DIAGNOSIS — K219 Gastro-esophageal reflux disease without esophagitis: Secondary | ICD-10-CM | POA: Diagnosis not present

## 2016-09-04 DIAGNOSIS — R739 Hyperglycemia, unspecified: Secondary | ICD-10-CM | POA: Diagnosis not present

## 2016-09-04 DIAGNOSIS — E669 Obesity, unspecified: Secondary | ICD-10-CM | POA: Insufficient documentation

## 2016-09-04 DIAGNOSIS — I10 Essential (primary) hypertension: Secondary | ICD-10-CM | POA: Insufficient documentation

## 2016-09-04 DIAGNOSIS — Z87891 Personal history of nicotine dependence: Secondary | ICD-10-CM | POA: Diagnosis not present

## 2016-09-04 DIAGNOSIS — E119 Type 2 diabetes mellitus without complications: Secondary | ICD-10-CM

## 2016-09-04 DIAGNOSIS — Z888 Allergy status to other drugs, medicaments and biological substances status: Secondary | ICD-10-CM | POA: Insufficient documentation

## 2016-09-04 DIAGNOSIS — I200 Unstable angina: Secondary | ICD-10-CM

## 2016-09-04 LAB — APTT: aPTT: 30 seconds (ref 24–36)

## 2016-09-04 LAB — CBC WITH DIFFERENTIAL/PLATELET
BASOS ABS: 0.1 10*3/uL (ref 0–0.1)
Basophils Relative: 1 %
Eosinophils Absolute: 0.1 10*3/uL (ref 0–0.7)
Eosinophils Relative: 1 %
HEMATOCRIT: 44.8 % (ref 40.0–52.0)
Hemoglobin: 15.3 g/dL (ref 13.0–18.0)
LYMPHS ABS: 1.1 10*3/uL (ref 1.0–3.6)
LYMPHS PCT: 10 %
MCH: 29.4 pg (ref 26.0–34.0)
MCHC: 34.1 g/dL (ref 32.0–36.0)
MCV: 86.2 fL (ref 80.0–100.0)
MONO ABS: 0.8 10*3/uL (ref 0.2–1.0)
Monocytes Relative: 7 %
NEUTROS ABS: 9.5 10*3/uL — AB (ref 1.4–6.5)
Neutrophils Relative %: 81 %
Platelets: 213 10*3/uL (ref 150–440)
RBC: 5.19 MIL/uL (ref 4.40–5.90)
RDW: 16.9 % — ABNORMAL HIGH (ref 11.5–14.5)
WBC: 11.5 10*3/uL — ABNORMAL HIGH (ref 3.8–10.6)

## 2016-09-04 LAB — COMPREHENSIVE METABOLIC PANEL
ALBUMIN: 3.7 g/dL (ref 3.5–5.0)
ALK PHOS: 103 U/L (ref 38–126)
ALT: 20 U/L (ref 17–63)
AST: 20 U/L (ref 15–41)
Anion gap: 8 (ref 5–15)
BILIRUBIN TOTAL: 0.5 mg/dL (ref 0.3–1.2)
BUN: 13 mg/dL (ref 6–20)
CALCIUM: 8.8 mg/dL — AB (ref 8.9–10.3)
CO2: 30 mmol/L (ref 22–32)
CREATININE: 1.03 mg/dL (ref 0.61–1.24)
Chloride: 101 mmol/L (ref 101–111)
GFR calc Af Amer: >60 mL/min (ref >60)
GFR calc non Af Amer: >60 mL/min (ref >60)
GLUCOSE: 158 mg/dL — AB (ref 65–99)
Potassium: 3.2 mmol/L — ABNORMAL LOW (ref 3.5–5.1)
Sodium: 139 mmol/L (ref 135–145)
TOTAL PROTEIN: 6.6 g/dL (ref 6.5–8.1)

## 2016-09-04 LAB — TSH: TSH: 1.205 u[IU]/mL (ref 0.350–4.500)

## 2016-09-04 LAB — LIPID PANEL
Cholesterol: 116 mg/dL (ref 0–200)
HDL: 42 mg/dL (ref >40)
LDL CALC: 51 mg/dL (ref 0–99)
TRIGLYCERIDES: 116 mg/dL (ref <150)
Total CHOL/HDL Ratio: 2.8 RATIO
VLDL: 23 mg/dL (ref 0–40)

## 2016-09-04 LAB — MAGNESIUM: Magnesium: 2 mg/dL (ref 1.7–2.4)

## 2016-09-04 LAB — TROPONIN I
Troponin I: <0.03 ng/mL (ref <0.03)
Troponin I: <0.03 ng/mL (ref <0.03)

## 2016-09-04 LAB — PROTIME-INR
INR: 1.08
Prothrombin Time: 14 seconds (ref 11.4–15.2)

## 2016-09-04 MED ORDER — BUSPIRONE HCL 5 MG PO TABS
10.0000 mg | ORAL_TABLET | Freq: Two times a day (BID) | ORAL | Status: DC
  Administered 2016-09-04 – 2016-09-05 (×2): 10 mg via ORAL
  Filled 2016-09-04 (×2): qty 2

## 2016-09-04 MED ORDER — MELATONIN 5 MG PO TABS
5.0000 mg | ORAL_TABLET | Freq: Every day | ORAL | Status: DC
  Administered 2016-09-04: 5 mg via ORAL
  Filled 2016-09-04 (×2): qty 1

## 2016-09-04 MED ORDER — ACETAMINOPHEN 650 MG RE SUPP: 650.0000 mg | Freq: Four times a day (QID) | RECTAL | Status: DC | PRN

## 2016-09-04 MED ORDER — AMITRIPTYLINE HCL 10 MG PO TABS
30.0000 mg | ORAL_TABLET | Freq: Every day | ORAL | Status: DC
  Administered 2016-09-04: 20 mg via ORAL
  Filled 2016-09-04: qty 3

## 2016-09-04 MED ORDER — PANTOPRAZOLE SODIUM 40 MG PO TBEC
40.0000 mg | DELAYED_RELEASE_TABLET | Freq: Every day | ORAL | Status: DC
  Administered 2016-09-04 – 2016-09-05 (×2): 40 mg via ORAL
  Filled 2016-09-04 (×2): qty 1

## 2016-09-04 MED ORDER — MOMETASONE FURO-FORMOTEROL FUM 200-5 MCG/ACT IN AERO
2.0000 | INHALATION_SPRAY | Freq: Two times a day (BID) | RESPIRATORY_TRACT | Status: DC
  Administered 2016-09-04 – 2016-09-05 (×2): 2 via RESPIRATORY_TRACT
  Filled 2016-09-04: qty 8.8

## 2016-09-04 MED ORDER — SODIUM CHLORIDE 0.9% FLUSH
3.0000 mL | Freq: Two times a day (BID) | INTRAVENOUS | Status: DC
  Administered 2016-09-05: 3 mL via INTRAVENOUS

## 2016-09-04 MED ORDER — POTASSIUM CHLORIDE CRYS ER 20 MEQ PO TBCR
40.0000 meq | EXTENDED_RELEASE_TABLET | Freq: Once | ORAL | Status: AC
  Administered 2016-09-04: 40 meq via ORAL
  Filled 2016-09-04: qty 2

## 2016-09-04 MED ORDER — ONDANSETRON HCL 4 MG PO TABS: 4.0000 mg | ORAL_TABLET | Freq: Four times a day (QID) | ORAL | Status: DC | PRN

## 2016-09-04 MED ORDER — IPRATROPIUM BROMIDE 0.02 % IN SOLN
0.5000 mg | Freq: Four times a day (QID) | RESPIRATORY_TRACT | Status: DC
  Administered 2016-09-05: 0.5 mg via RESPIRATORY_TRACT
  Filled 2016-09-04 (×2): qty 2.5

## 2016-09-04 MED ORDER — METOPROLOL TARTRATE 100 MG PO TABS
100.0000 mg | ORAL_TABLET | Freq: Two times a day (BID) | ORAL | Status: DC
  Administered 2016-09-04 – 2016-09-05 (×2): 100 mg via ORAL
  Filled 2016-09-04 (×2): qty 1

## 2016-09-04 MED ORDER — HEPARIN BOLUS VIA INFUSION
4000.0000 [IU] | Freq: Once | INTRAVENOUS | Status: AC
  Administered 2016-09-04: 4000 [IU] via INTRAVENOUS
  Filled 2016-09-04: qty 4000

## 2016-09-04 MED ORDER — ONDANSETRON HCL 4 MG/2ML IJ SOLN: 4.0000 mg | Freq: Four times a day (QID) | INTRAMUSCULAR | Status: DC | PRN

## 2016-09-04 MED ORDER — MORPHINE SULFATE (PF) 2 MG/ML IV SOLN: 2.0000 mg | INTRAVENOUS | Status: DC | PRN

## 2016-09-04 MED ORDER — ATORVASTATIN CALCIUM 20 MG PO TABS
40.0000 mg | ORAL_TABLET | Freq: Every day | ORAL | Status: DC
  Administered 2016-09-04: 40 mg via ORAL
  Filled 2016-09-04: qty 2

## 2016-09-04 MED ORDER — NITROGLYCERIN 2 % TD OINT
1.0000 [in_us] | TOPICAL_OINTMENT | Freq: Four times a day (QID) | TRANSDERMAL | Status: DC
  Administered 2016-09-04 – 2016-09-05 (×3): 1 [in_us] via TOPICAL
  Filled 2016-09-04 (×3): qty 1

## 2016-09-04 MED ORDER — IPRATROPIUM-ALBUTEROL 0.5-2.5 (3) MG/3ML IN SOLN
3.0000 mL | Freq: Once | RESPIRATORY_TRACT | Status: AC
  Administered 2016-09-04: 3 mL via RESPIRATORY_TRACT

## 2016-09-04 MED ORDER — LEVOFLOXACIN 500 MG PO TABS: 750.0000 mg | ORAL_TABLET | Freq: Every day | ORAL | Status: DC

## 2016-09-04 MED ORDER — SENNOSIDES-DOCUSATE SODIUM 8.6-50 MG PO TABS
2.0000 | ORAL_TABLET | Freq: Two times a day (BID) | ORAL | Status: DC
  Administered 2016-09-04 – 2016-09-05 (×2): 2 via ORAL
  Filled 2016-09-04 (×2): qty 2

## 2016-09-04 MED ORDER — IPRATROPIUM-ALBUTEROL 0.5-2.5 (3) MG/3ML IN SOLN
RESPIRATORY_TRACT | Status: AC
  Filled 2016-09-04: qty 3

## 2016-09-04 MED ORDER — HEPARIN SODIUM (PORCINE) 5000 UNIT/ML IJ SOLN: 4000.0000 [IU] | INTRAMUSCULAR | Status: DC

## 2016-09-04 MED ORDER — SIMETHICONE 80 MG PO CHEW: 80.0000 mg | CHEWABLE_TABLET | Freq: Three times a day (TID) | ORAL | Status: DC | PRN

## 2016-09-04 MED ORDER — ALBUTEROL SULFATE (2.5 MG/3ML) 0.083% IN NEBU
2.5000 mg | INHALATION_SOLUTION | Freq: Four times a day (QID) | RESPIRATORY_TRACT | Status: DC
  Administered 2016-09-05: 2.5 mg via RESPIRATORY_TRACT
  Filled 2016-09-04 (×2): qty 3

## 2016-09-04 MED ORDER — HEPARIN (PORCINE) IN NACL 100-0.45 UNIT/ML-% IJ SOLN
1050.0000 [IU]/h | INTRAMUSCULAR | Status: DC
  Administered 2016-09-04 (×2): 1050 [IU]/h via INTRAVENOUS
  Filled 2016-09-04: qty 250

## 2016-09-04 MED ORDER — NITROGLYCERIN 0.4 MG SL SUBL: 0.4000 mg | SUBLINGUAL_TABLET | SUBLINGUAL | Status: DC | PRN

## 2016-09-04 MED ORDER — ISOSORBIDE MONONITRATE ER 60 MG PO TB24
60.0000 mg | ORAL_TABLET | Freq: Every day | ORAL | Status: DC
  Administered 2016-09-05: 60 mg via ORAL
  Filled 2016-09-04: qty 1

## 2016-09-04 MED ORDER — LISINOPRIL 10 MG PO TABS
10.0000 mg | ORAL_TABLET | Freq: Every day | ORAL | Status: DC
  Administered 2016-09-05: 10 mg via ORAL
  Filled 2016-09-04: qty 1

## 2016-09-04 MED ORDER — ACETAMINOPHEN 325 MG PO TABS
650.0000 mg | ORAL_TABLET | Freq: Four times a day (QID) | ORAL | Status: DC | PRN
  Administered 2016-09-05 (×2): 650 mg via ORAL
  Filled 2016-09-04 (×2): qty 2

## 2016-09-04 MED ORDER — FLUTICASONE PROPIONATE 50 MCG/ACT NA SUSP
2.0000 | Freq: Every day | NASAL | Status: DC
  Administered 2016-09-05: 2 via NASAL
  Filled 2016-09-04: qty 16

## 2016-09-04 MED ORDER — GABAPENTIN 300 MG PO CAPS
300.0000 mg | ORAL_CAPSULE | Freq: Three times a day (TID) | ORAL | Status: DC
  Administered 2016-09-04 – 2016-09-05 (×2): 300 mg via ORAL
  Filled 2016-09-04 (×2): qty 1

## 2016-09-04 NOTE — ED Provider Notes (Signed)
[facility] Emergency Department Provider Note    First MD Initiated Contact with Patient 09/04/16 1748     (approximate)  I have reviewed the triage vital signs and the nursing notes.   HISTORY  Chief Complaint Shortness of Breath and Cough    HPI Darius Norris is a 65 y.o. 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 Patient was just recently treated for COPD exacerbation with prednisone as well as oral antibiotics. States that he was otherwise feeling better but the shortness of breath became worse this morning. He went to lay down for a nap around 2 PM and that's when he began feeling chest pressure roughly 5 out of 10 in severity associated with diaphoresis and worsening shortness of breath.   Past Medical History:  Diagnosis Date  . CAD (coronary artery disease)   . Colitis   . COPD (chronic obstructive pulmonary disease) ([place])   . Emphysema lung ([place])   . GERD (gastroesophageal reflux disease)   . Hypertension     Patient Active Problem List   Diagnosis Date Noted  . Unstable angina ([place]) 09/04/2016  . Uncontrolled hypertension 09/04/2016  . Hypokalemia 09/04/2016  . Leukocytosis 09/04/2016  . Hyperglycemia 09/04/2016  . COPD exacerbation ([place]) 12/15/2015  . Elevated troponin 12/15/2015  . HTN (hypertension) 12/15/2015  . GERD (gastroesophageal reflux disease) 12/15/2015  . CAD (coronary artery disease) 12/15/2015  . Angina pectoris ([place]) 12/15/2015    Past Surgical History:  Procedure Laterality Date  . ABDOMINAL HERNIA REPAIR Bilateral     Prior to Admission medications   Medication Sig Start Date End Date Taking? Authorizing Provider  albuterol (PROVENTIL HFA;VENTOLIN HFA) 108 (90 Base) MCG/ACT inhaler Inhale 2 puffs into the lungs every 6 (six) hours as needed for wheezing or  shortness of breath. 12/15/15   Nicholes Mango, MD  amitriptyline (ELAVIL) 10 MG tablet Take 30 mg by mouth at bedtime.    Historical Provider, MD  budesonide-formoterol (SYMBICORT) 160-4.5 MCG/ACT inhaler Inhale 2 puffs into the lungs 2 (two) times daily.    Historical Provider, MD  enalapril (VASOTEC) 2.5 MG tablet Take 1 tablet (2.5 mg total) by mouth daily. 12/15/15   Nicholes Mango, MD  gabapentin (NEURONTIN) 300 MG capsule Take 300 mg by mouth 3 (three) times daily.    Historical Provider, MD  Ipratropium-Albuterol (COMBIVENT RESPIMAT) 20-100 MCG/ACT AERS respimat Inhale 1 puff into the lungs every 6 (six) hours as needed for wheezing or shortness of breath.    Historical Provider, MD  isosorbide mononitrate (IMDUR) 60 MG 24 hr tablet Take 60 mg by mouth daily.    Historical Provider, MD  levofloxacin (LEVAQUIN) 750 MG tablet Take 1 tablet (750 mg total) by mouth daily. 04/16/16   Lisa Roca, MD  metoprolol (LOPRESSOR) 50 MG tablet Take 100 mg by mouth 2 (two) times daily.    Historical Provider, MD  OMEPRAZOLE PO Take 1 capsule by mouth daily.    Historical Provider, MD  predniSONE (DELTASONE) 10 MG tablet 50 mg daily for 4 more days 04/16/16   Lisa Roca, MD    Allergies Zantac [ranitidine hcl]; Codeine; Erythromycin; and Vicodin [hydrocodone-acetaminophen]  History reviewed. No pertinent family history.  Social History Social History  Substance Use Topics  . Smoking status: Former Research scientist (life sciences)  . Smokeless tobacco: Never Used  .  Alcohol use No    Review of Systems Patient denies headaches, rhinorrhea, blurry vision, numbness, shortness of breath, chest pain, edema, cough, abdominal pain, nausea, vomiting, diarrhea, dysuria, fevers, rashes or hallucinations unless otherwise stated above in HPI. ____________________________________________   PHYSICAL EXAM:  VITAL SIGNS: Vitals:   09/04/16 1645  BP: (!) 166/79  Pulse: 68  Resp: 16  Temp: 98.1 F (36.7 C)    Constitutional:  Alert and oriented. Chronically ill elderly man male. Very pleasant. Eyes: Conjunctivae are normal. PERRL. EOMI. Head: Atraumatic. Nose: No congestion/rhinnorhea. Mouth/Throat: Mucous membranes are moist.  Oropharynx non-erythematous. Neck: No stridor. Painless ROM. No cervical spine tenderness to palpation Hematological/Lymphatic/Immunilogical: No cervical lymphadenopathy. Cardiovascular: Normal rate, regular rhythm. Grossly normal heart sounds.  Good peripheral circulation. Respiratory: tachypnic with pursed lip breathing.  No retractions. Diminished breath sounds bilaterally. Gastrointestinal: Soft and nontender. No distention. No abdominal bruits. No CVA tenderness. Musculoskeletal: No lower extremity tenderness nor edema.  No joint effusions. Neurologic:  Normal speech and language. No gross focal neurologic deficits are appreciated. No gait instability. Skin:  Skin is warm, dry and intact. No rash noted. Psychiatric: Mood and affect are normal. Speech and behavior are normal.  ____________________________________________   LABS (all labs ordered are listed, but only abnormal results are displayed)  Results for orders placed or performed during the hospital encounter of 09/04/16 (from the past 24 hour(s))  CBC with Differential     Status: Abnormal   Collection Time: 09/04/16  5:16 PM  Result Value Ref Range   WBC 11.5 (H) 3.8 - 10.6 K/uL   RBC 5.19 4.40 - 5.90 MIL/uL   Hemoglobin 15.3 13.0 - 18.0 g/dL   HCT 44.8 40.0 - 52.0 %   MCV 86.2 80.0 - 100.0 fL   MCH 29.4 26.0 - 34.0 pg   MCHC 34.1 32.0 - 36.0 g/dL   RDW 16.9 (H) 11.5 - 14.5 %   Platelets 213 150 - 440 K/uL   Neutrophils Relative % 81 %   Neutro Abs 9.5 (H) 1.4 - 6.5 K/uL   Lymphocytes Relative 10 %   Lymphs Abs 1.1 1.0 - 3.6 K/uL   Monocytes Relative 7 %   Monocytes Absolute 0.8 0.2 - 1.0 K/uL   Eosinophils Relative 1 %   Eosinophils Absolute 0.1 0 - 0.7 K/uL   Basophils Relative 1 %   Basophils Absolute 0.1 0  - 0.1 K/uL  Troponin I     Status: None   Collection Time: 09/04/16  5:16 PM  Result Value Ref Range   Troponin I <0.03 <0.03 ng/mL  Comprehensive metabolic panel     Status: Abnormal   Collection Time: 09/04/16  5:16 PM  Result Value Ref Range   Sodium 139 135 - 145 mmol/L   Potassium 3.2 (L) 3.5 - 5.1 mmol/L   Chloride 101 101 - 111 mmol/L   CO2 30 22 - 32 mmol/L   Glucose, Bld 158 (H) 65 - 99 mg/dL   BUN 13 6 - 20 mg/dL   Creatinine, Ser 1.03 0.61 - 1.24 mg/dL   Calcium 8.8 (L) 8.9 - 10.3 mg/dL   Total Protein 6.6 6.5 - 8.1 g/dL   Albumin 3.7 3.5 - 5.0 g/dL   AST 20 15 - 41 U/L   ALT 20 17 - 63 U/L   Alkaline Phosphatase 103 38 - 126 U/L   Total Bilirubin 0.5 0.3 - 1.2 mg/dL   GFR calc non Af Amer >60 >60 mL/min   GFR calc Af  Amer >60 >60 mL/min   Anion gap 8 5 - 15   ____________________________________________  EKG My review and personal interpretation at Time: 17:36   Indication: chest pain  Rate: 65  Rhythm: sinus Axis: noraml Other: deeply inverted anterolateral t waves with st depressions most pronounced in V2-5 ____________________________________________  RADIOLOGY  CXR with severe COPD, no infiltrate ____________________________________________   PROCEDURES  Procedure(s) performed: none    Critical Care performed: yes CRITICAL CARE Performed by: Merlyn Lot   Total critical care time: 45 minutes  Critical care time was exclusive of separately billable procedures and treating other patients.  Critical care was necessary to treat or prevent imminent or life-threatening deterioration.  Critical care was time spent personally by me on the following activities: development of treatment plan with patient and/or surrogate as well as nursing, discussions with consultants, evaluation of patient's response to treatment, examination of patient, obtaining history from patient or surrogate, ordering and performing treatments and interventions, ordering and  review of laboratory studies, ordering and review of radiographic studies, pulse oximetry and re-evaluation of patient's condition.  ____________________________________________   INITIAL IMPRESSION / ASSESSMENT AND PLAN / ED COURSE  Pertinent labs & imaging results that were available during my care of the patient were reviewed by me and considered in my medical decision making (see chart for details).  DDX: acs, posterior mi, copd, pna, electrolyte abnormality  MURIEL OHM is a 65 y.o. who presents to the ED with Complaint of chest pain and shortness of breath. Patient afebrile but does arrive in moderate respiratory distress. His initial EKG very concerning for posterior MI with deeply inverted T waves and ST depressions. On arrival to the ER he was chest pain-free and the posterior leads did not show any ST elevations to suggest acute STEMI. Chest x-ray does show significant COPD and emphysema. Initial troponin is negative for acute ischemia but based on his presentation, risk factors and abnormal EKG will page cardiology for further evaluation.  Clinical Course  Comment By Time  Patient reassessed and still reports he was chest pain-free. Spoke with Dr. Lovena Le of [facility] and he agrees with plan for heparinization and admission to hospital for serial enzymes and close hemodynamic monitoring with plan for cardiology evaluation in the a.m. for possible cath.  Have discussed with the patient and available family all diagnostics and treatments performed thus far and all questions were answered to the best of my ability. The patient demonstrates understanding and agreement with plan.  Merlyn Lot, MD 09/18 1832     ____________________________________________   FINAL CLINICAL IMPRESSION(S) / ED DIAGNOSES  Final diagnoses:  COPD exacerbation ([place])  Chest pain, unspecified chest pain type  Abnormal EKG      NEW MEDICATIONS STARTED DURING THIS VISIT:  New  Prescriptions   No medications on file     Note:  This document was prepared using Dragon voice recognition software and may include unintentional dictation errors.    Merlyn Lot, MD 09/04/16 [phone]

## 2016-09-04 NOTE — ED Triage Notes (Signed)
Pt presents to ED with c/o cough and SOB since last week with yellow sputum. [facility] and was given antibiotic and stroids. Pt states cough got worse and thinks it might have got into his lungs.

## 2016-09-04 NOTE — ED Notes (Signed)
Posterior ekg performed per Dr Quentin Cornwall

## 2016-09-04 NOTE — ED Notes (Signed)
Pt triaged by Kandee Keen, RN

## 2016-09-04 NOTE — H&P (Signed)
[facility] [place] NAME: Darius Norris    MR#:  [id number]  DATE OF BIRTH:  1951-10-01  DATE OF ADMISSION:  09/04/2016  PRIMARY CARE PHYSICIAN: Pcp Not In System   REQUESTING/REFERRING PHYSICIAN:   CHIEF COMPLAINT:   Chief Complaint  Patient presents with  . Shortness of Breath  . Cough    HISTORY OF PRESENT ILLNESS: Darius Norris  is a 65 y.o. male with a known history of Coronary artery disease, COPD, gastroesophageal reflux disease, essential hypertension, obesity, who presents to the hospital with complaints of feeling dizzy, having hot flashes, having chest pain. Apparently, patient got sick about a week ago with cough, wheezing, phlegm production, yellow and green phlegm, he was seen at [facility] of 2 REM emergency room, initiated on prednisone taper, given amoxicillin, and loratadine, however, his cough got deeper, and patient felt that he may be coming down with pneumonia, he started having chest pain across the chest, and needle type pain, lasting approximately 15 minutes. On arrival to emergency room, his EKG revealed significant ST depressions in inferior and diffuse anterolateral leads, hospitalist services were contacted for admission  PAST MEDICAL HISTORY:   Past Medical History:  Diagnosis Date  . CAD (coronary artery disease)   . Colitis   . COPD (chronic obstructive pulmonary disease) ([place])   . Emphysema lung ([place])   . GERD (gastroesophageal reflux disease)   . Hypertension     PAST SURGICAL HISTORY: Past Surgical History:  Procedure Laterality Date  . ABDOMINAL HERNIA REPAIR Bilateral     SOCIAL HISTORY:  Social History  Substance Use Topics  . Smoking status: Former Research scientist (life sciences)  . Smokeless tobacco: Never Used  . Alcohol use No    FAMILY HISTORY: History reviewed. No pertinent family history.  DRUG ALLERGIES:  Allergies  Allergen Reactions  . Zantac [Ranitidine Hcl] Shortness Of Breath  . Codeine Itching  .  Erythromycin Hives  . Vicodin [Hydrocodone-Acetaminophen] Rash    Review of Systems  Constitutional: Positive for chills and fever. Negative for weight loss.  HENT: Negative for congestion.   Eyes: Negative for blurred vision and double vision.  Respiratory: Positive for cough, sputum production and shortness of breath. Negative for wheezing.   Cardiovascular: Positive for chest pain, palpitations and leg swelling. Negative for orthopnea and PND.  Gastrointestinal: Negative for abdominal pain, blood in stool, constipation, diarrhea, nausea and vomiting.  Genitourinary: Negative for dysuria, frequency, hematuria and urgency.  Musculoskeletal: Negative for falls.  Neurological: Negative for dizziness, tremors, focal weakness and headaches.  Endo/Heme/Allergies: Does not bruise/bleed easily.  Psychiatric/Behavioral: Negative for depression. The patient does not have insomnia.     MEDICATIONS AT HOME:  Prior to Admission medications   Medication Sig Start Date End Date Taking? Authorizing Provider  albuterol (PROVENTIL HFA;VENTOLIN HFA) 108 (90 Base) MCG/ACT inhaler Inhale 2 puffs into the lungs every 6 (six) hours as needed for wheezing or shortness of breath. 12/15/15   Nicholes Mango, MD  amitriptyline (ELAVIL) 10 MG tablet Take 30 mg by mouth at bedtime.    Historical Provider, MD  budesonide-formoterol (SYMBICORT) 160-4.5 MCG/ACT inhaler Inhale 2 puffs into the lungs 2 (two) times daily.    Historical Provider, MD  enalapril (VASOTEC) 2.5 MG tablet Take 1 tablet (2.5 mg total) by mouth daily. 12/15/15   Nicholes Mango, MD  gabapentin (NEURONTIN) 300 MG capsule Take 300 mg by mouth 3 (three) times daily.    Historical Provider, MD  Ipratropium-Albuterol (COMBIVENT  RESPIMAT) 20-100 MCG/ACT AERS respimat Inhale 1 puff into the lungs every 6 (six) hours as needed for wheezing or shortness of breath.    Historical Provider, MD  isosorbide mononitrate (IMDUR) 60 MG 24 hr tablet Take 60 mg by mouth  daily.    Historical Provider, MD  levofloxacin (LEVAQUIN) 750 MG tablet Take 1 tablet (750 mg total) by mouth daily. 04/16/16   Lisa Roca, MD  metoprolol (LOPRESSOR) 50 MG tablet Take 100 mg by mouth 2 (two) times daily.    Historical Provider, MD  OMEPRAZOLE PO Take 1 capsule by mouth daily.    Historical Provider, MD  predniSONE (DELTASONE) 10 MG tablet 50 mg daily for 4 more days 04/16/16   Lisa Roca, MD      PHYSICAL EXAMINATION:   VITAL SIGNS: Blood pressure (!) 166/79, pulse 68, temperature 98.1 F (36.7 C), temperature source Oral, resp. rate 16, height 5\' 6"  (1.676 m), weight 93.4 kg (206 lb).  GENERAL:  65 y.o.-year-old patient Sitting on the edge of the bed with no acute distress.  EYES: Pupils equal, round, reactive to light and accommodation. No scleral icterus. Extraocular muscles intact.  HEENT: Head atraumatic, normocephalic. Oropharynx and nasopharynx clear.  NECK:  Supple, no jugular venous distention. No thyroid enlargement, no tenderness.  LUNGS: Barrel chest, minimally diminished breath sounds bilaterally, no wheezing, rales,rhonchi or crepitation. No use of accessory muscles of respiration.  CARDIOVASCULAR: S1, S2 distant, rhythm was regular. No murmurs, rubs, or gallops.  ABDOMEN: Soft, nontender, nondistended. Bowel sounds present. No organomegaly or mass.  EXTREMITIES: 1-2+ lower extremity and pedal edema, worse on the right, no cyanosis, or clubbing. Peripheral pulses are good 2+ NEUROLOGIC: Cranial nerves II through XII are intact. Muscle strength 5/5 in all extremities. Sensation intact. Gait not checked.  PSYCHIATRIC: The patient is alert and oriented x 3.  SKIN: No obvious rash, lesion, or ulcer.   LABORATORY PANEL:   CBC  Recent Labs Lab 09/04/16 1716  WBC 11.5*  HGB 15.3  HCT 44.8  PLT 213  MCV 86.2  MCH 29.4  MCHC 34.1  RDW 16.9*  LYMPHSABS 1.1  MONOABS 0.8  EOSABS 0.1  BASOSABS 0.1    ------------------------------------------------------------------------------------------------------------------  Chemistries   Recent Labs Lab 09/04/16 1716  NA 139  K 3.2*  CL 101  CO2 30  GLUCOSE 158*  BUN 13  CREATININE 1.03  CALCIUM 8.8*  AST 20  ALT 20  ALKPHOS 103  BILITOT 0.5   ------------------------------------------------------------------------------------------------------------------  Cardiac Enzymes  Recent Labs Lab 09/04/16 1716  TROPONINI <0.03   ------------------------------------------------------------------------------------------------------------------  RADIOLOGY: Dg Chest 2 View  Result Date: 09/04/2016 CLINICAL DATA:  Short of breath EXAM: CHEST  2 VIEW COMPARISON:  04/16/2016 FINDINGS: Chronic lung disease with COPD and apical emphysema. Crowded vascular markings in lung bases unchanged. Negative for pneumonia. Negative for heart failure or effusion. No change from the prior study. IMPRESSION: COPD without acute cardiopulmonary abnormality.  Apical emphysema. Electronically Signed   By: Franchot Gallo M.D.   On: 09/04/2016 17:41    EKG: Orders placed or performed during the hospital encounter of 09/04/16  . ED EKG  . ED EKG  EKG in the emergency room revealed sinus rhythm with short PR. Rate of 66, posterior infarct, age undetermined, ST depression in inferior lateral area, more pronounced than in the past as per EKG on  The 27th of December 2016  IMPRESSION AND PLAN:  Active Problems:   Unstable angina ([place])   Uncontrolled hypertension   Hypokalemia  Leukocytosis   Hyperglycemia #1. Unstable angina, admit patient to medical floor, continue him on metoprolol, aspirin, initiate heparin intravenously, check cardiac enzymes 3, good cardiologist involved recommendations, stress test in the morning, if cardiac enzymes are negative #2. Uncontrolled hypertension, continue outpatient medications, add nitroglycerin topically, advance  blood pressure medications #3. Hypokalemia, supplement orally, get magnesium level checked #4. Leukocytosis, likely due to recent use of steroids, follow in the morning #5. Acute bronchitis, initiate levofloxacin to cover atypicals, continue nebulizing therapy     All the records are reviewed and case discussed with ED provider. Management plans discussed with the patient, family and they are in agreement.  CODE STATUS: Code Status History    Date Active Date Inactive Code Status Order ID Comments User Context   12/15/2015  1:58 AM 12/15/2015  7:22 PM Full Code [id number]  Lance Coon, MD Inpatient       TOTAL TIME TAKING CARE OF THIS PATIENT: 50 minutes.    Theodoro Grist M.D on 09/04/2016 at 6:45 PM  Between 7am to 6pm - Pager - [phone] After 6pm go to [email or web address] - password [email or web address] [facility]  Office  [phone]  CC: Primary care physician; Pcp Not In System

## 2016-09-04 NOTE — Consult Note (Signed)
ANTICOAGULATION CONSULT NOTE - Initial Consult  Pharmacy Consult for heparin drip Indication: chest pain/ACS  Allergies  Allergen Reactions  . Zantac [Ranitidine Hcl] Shortness Of Breath  . Codeine Itching  . Erythromycin Hives  . Vicodin [Hydrocodone-Acetaminophen] Rash    Patient Measurements: Height: 5\' 6"  (167.6 cm) Weight: 206 lb (93.4 kg) IBW/kg (Calculated) : 63.8 Heparin Dosing Weight: 83.9kg  Vital Signs: Temp: 98.1 F (36.7 C) (09/18 1645) Temp Source: Oral (09/18 1645) BP: 166/79 (09/18 1645) Pulse Rate: 68 (09/18 1645)  Labs:  Recent Labs  09/04/16 1716  HGB 15.3  HCT 44.8  PLT 213  CREATININE 1.03  TROPONINI <0.03    Estimated Creatinine Clearance: 76.5 mL/min (by C-G formula based on SCr of 1.03 mg/dL).   Medical History: Past Medical History:  Diagnosis Date  . CAD (coronary artery disease)   . Colitis   . COPD (chronic obstructive pulmonary disease) ([place])   . Emphysema lung ([place])   . GERD (gastroesophageal reflux disease)   . Hypertension     Medications:  Scheduled:  . heparin  4,000 Units Intravenous Once  . nitroGLYCERIN  1 inch Topical Q6H    Assessment: Pt is a 65 year old male who presents with chest pain. Baseline labs ordered. Notes reveal no home anticoagulation. Pharmacy is consulted to dose heparin drip.  Goal of Therapy:  Heparin level 0.3-0.7 units/ml Monitor platelets by anticoagulation protocol: Yes   Plan:  Give 4000 units bolus x 1 Start heparin infusion at 1050 units/hr Check anti-Xa level in 6 hours and daily while on heparin Continue to monitor H&H and platelets  Melissa D Maccia 09/04/2016,6:25 PM

## 2016-09-04 NOTE — ED Notes (Signed)
Attempted to call report. Nurse receiving change of shift report and unable to come to the phone.

## 2016-09-05 LAB — TROPONIN I

## 2016-09-05 LAB — CBC
HEMATOCRIT: 46 % (ref 40.0–52.0)
HEMOGLOBIN: 15.7 g/dL (ref 13.0–18.0)
MCH: 29.6 pg (ref 26.0–34.0)
MCHC: 34.2 g/dL (ref 32.0–36.0)
MCV: 86.6 fL (ref 80.0–100.0)
Platelets: 197 10*3/uL (ref 150–440)
RBC: 5.31 MIL/uL (ref 4.40–5.90)
RDW: 16.7 % — ABNORMAL HIGH (ref 11.5–14.5)
WBC: 9.9 10*3/uL (ref 3.8–10.6)

## 2016-09-05 LAB — EXPECTORATED SPUTUM ASSESSMENT W GRAM STAIN, RFLX TO RESP C

## 2016-09-05 LAB — GLUCOSE, CAPILLARY: GLUCOSE-CAPILLARY: 98 mg/dL (ref 65–99)

## 2016-09-05 LAB — BASIC METABOLIC PANEL
ANION GAP: 5 (ref 5–15)
BUN: 14 mg/dL (ref 6–20)
CALCIUM: 8.8 mg/dL — AB (ref 8.9–10.3)
CO2: 32 mmol/L (ref 22–32)
Chloride: 102 mmol/L (ref 101–111)
Creatinine, Ser: 0.92 mg/dL (ref 0.61–1.24)
Glucose, Bld: 106 mg/dL — ABNORMAL HIGH (ref 65–99)
POTASSIUM: 3.3 mmol/L — AB (ref 3.5–5.1)
SODIUM: 139 mmol/L (ref 135–145)

## 2016-09-05 LAB — HEPARIN LEVEL (UNFRACTIONATED)
HEPARIN UNFRACTIONATED: 0.42 [IU]/mL (ref 0.30–0.70)
Heparin Unfractionated: 0.35 IU/mL (ref 0.30–0.70)

## 2016-09-05 LAB — EXPECTORATED SPUTUM ASSESSMENT W REFEX TO RESP CULTURE

## 2016-09-05 MED ORDER — IPRATROPIUM BROMIDE 0.02 % IN SOLN: 0.5000 mg | Freq: Four times a day (QID) | RESPIRATORY_TRACT | 12 refills | Status: DC | PRN

## 2016-09-05 MED ORDER — ALBUTEROL SULFATE (2.5 MG/3ML) 0.083% IN NEBU: 2.5000 mg | INHALATION_SOLUTION | Freq: Four times a day (QID) | RESPIRATORY_TRACT | Status: DC | PRN

## 2016-09-05 MED ORDER — PREDNISONE 10 MG (21) PO TBPK: 10.0000 mg | ORAL_TABLET | Freq: Every day | ORAL | 0 refills | Status: DC

## 2016-09-05 MED ORDER — IPRATROPIUM BROMIDE 0.02 % IN SOLN: 0.5000 mg | Freq: Four times a day (QID) | RESPIRATORY_TRACT | Status: DC | PRN

## 2016-09-05 MED ORDER — FUROSEMIDE 20 MG PO TABS: 20.0000 mg | ORAL_TABLET | Freq: Every day | ORAL | 0 refills | Status: DC

## 2016-09-05 MED ORDER — LEVOFLOXACIN 750 MG PO TABS: 750.0000 mg | ORAL_TABLET | Freq: Every day | ORAL | 0 refills | Status: DC

## 2016-09-05 NOTE — Progress Notes (Signed)
Patient wants to wait till when he eat before taking meds

## 2016-09-05 NOTE — Progress Notes (Signed)
Discharge instructions explained to pt and pts spouse / verbalized an understanding/ iv and tele removed/ rx and work note given to pt/ will transport off unit via wheelchair.

## 2016-09-05 NOTE — Progress Notes (Signed)
ANTICOAGULATION CONSULT NOTE - Follow Up Consult  Pharmacy Consult for Heparin Drip  Indication: chest pain/ACS  Allergies  Allergen Reactions  . Zantac [Ranitidine Hcl] Shortness Of Breath  . Codeine Itching  . Erythromycin Hives  . Vicodin [Hydrocodone-Acetaminophen] Rash    Patient Measurements: Height: 5\' 6"  (167.6 cm) Weight: 206 lb (93.4 kg) IBW/kg (Calculated) : 63.8 Heparin Dosing Weight: 83.9 kg  Vital Signs: Temp: 97.6 F (36.4 C) (09/19 1153) Temp Source: Oral (09/19 1153) BP: 154/79 (09/19 1153) Pulse Rate: 73 (09/19 1153)  Labs:  Recent Labs  09/04/16 1716 09/04/16 2050 09/05/16 0121 09/05/16 0733  HGB 15.3  --   --  15.7  HCT 44.8  --   --  46.0  PLT 213  --   --  197  APTT 30  --   --   --   LABPROT 14.0  --   --   --   INR 1.08  --   --   --   HEPARINUNFRC  --   --  0.35 0.42  CREATININE 1.03  --   --  0.92  TROPONINI <0.03 <0.03 <0.03 <0.03    Estimated Creatinine Clearance: 85.6 mL/min (by C-G formula based on SCr of 0.92 mg/dL).   Medications:  Infusions:  . heparin 1,050 Units/hr (09/04/16 2100)    Assessment: Pt is a 65 year old male who presents with chest pain. Baseline labs ordered. Notes reveal no home anticoagulation. Pharmacy is consulted to dose heparin drip.  9/19 0733 Heparin level resulted at 0.42, previous level at 0.35  Goal of Therapy:  Heparin level 0.3-0.7 units/ml Monitor platelets by anticoagulation protocol: Yes   Plan:  Will continue with current heparin infusion rate. Patient with two levels within therapeutic range will check heparin level and CBC with daily labs.  Paulina Fusi, PharmD, BCPS 09/05/2016 12:04 PM

## 2016-09-05 NOTE — Care Management Obs Status (Signed)
[place] NOTIFICATION   Patient Details  Name: Darius Norris MRN: [id number] Date of Birth: 03-Sep-1951   Medicare Observation Status Notification Given:  Yes    Katrina Stack, RN 09/05/2016, 10:21 AM

## 2016-09-05 NOTE — Care Management (Signed)
Placed in observation for unstable angina. Independent in all adls, denies issues accessing medical care, obtaining medications or with transportation.  Current with  PCP.  No discharge needs identified at present by care manager or members of care team

## 2016-09-05 NOTE — Consult Note (Signed)
ANTICOAGULATION CONSULT NOTE - Initial Consult  Pharmacy Consult for heparin drip Indication: chest pain/ACS  Allergies  Allergen Reactions  . Zantac [Ranitidine Hcl] Shortness Of Breath  . Codeine Itching  . Erythromycin Hives  . Vicodin [Hydrocodone-Acetaminophen] Rash    Patient Measurements: Height: 5\' 6"  (167.6 cm) Weight: 206 lb (93.4 kg) IBW/kg (Calculated) : 63.8 Heparin Dosing Weight: 83.9kg  Vital Signs: Temp: 97.5 F (36.4 C) (09/18 1959) Temp Source: Oral (09/18 1959) BP: 168/77 (09/18 2335) Pulse Rate: 65 (09/18 2335)  Labs:  Recent Labs  09/04/16 1716 09/04/16 2050 09/05/16 0121  HGB 15.3  --   --   HCT 44.8  --   --   PLT 213  --   --   APTT 30  --   --   LABPROT 14.0  --   --   INR 1.08  --   --   HEPARINUNFRC  --   --  0.35  CREATININE 1.03  --   --   TROPONINI <0.03 <0.03 <0.03    Estimated Creatinine Clearance: 76.5 mL/min (by C-G formula based on SCr of 1.03 mg/dL).   Medical History: Past Medical History:  Diagnosis Date  . CAD (coronary artery disease)   . Colitis   . COPD (chronic obstructive pulmonary disease) ([place])   . Emphysema lung ([place])   . GERD (gastroesophageal reflux disease)   . Hypertension     Medications:  Scheduled:  . albuterol  2.5 mg Nebulization Q6H  . amitriptyline  30 mg Oral QHS  . atorvastatin  40 mg Oral QHS  . busPIRone  10 mg Oral BID  . fluticasone  2 spray Each Nare Daily  . gabapentin  300 mg Oral TID  . ipratropium  0.5 mg Nebulization Q6H  . isosorbide mononitrate  60 mg Oral Daily  . levofloxacin  750 mg Oral q1800  . lisinopril  10 mg Oral Daily  . Melatonin  5 mg Oral QHS  . metoprolol  100 mg Oral BID  . mometasone-formoterol  2 puff Inhalation BID  . nitroGLYCERIN  1 inch Topical Q6H  . pantoprazole  40 mg Oral Daily  . senna-docusate  2 tablet Oral BID  . sodium chloride flush  3 mL Intravenous Q12H    Assessment: Pt is a 65 year old male who presents with chest pain. Baseline  labs ordered. Notes reveal no home anticoagulation. Pharmacy is consulted to dose heparin drip.  Goal of Therapy:  Heparin level 0.3-0.7 units/ml Monitor platelets by anticoagulation protocol: Yes   Plan:  Give 4000 units bolus x 1 Start heparin infusion at 1050 units/hr Check anti-Xa level in 6 hours and daily while on heparin Continue to monitor H&H and platelets   9/19 01:30 heparin level 0.35. Continue current regimen and recheck in 6 hours to confirm.     Lenzie Sandler S 09/05/2016,3:33 AM

## 2016-09-05 NOTE — Progress Notes (Signed)
Dr. Ferol Luz and Dr. Ubaldo Glassing made aware that pt is refusing a stress test.

## 2016-09-05 NOTE — Progress Notes (Signed)
   This is to notify that [name] admitted to [facility].please excuse his wife from work for 9/19.  Darius Norris was admitted to the Hospital on 09/04/2016 and Discharged  09/05/2016 and should be excused from work/school      [facility] M.D on 09/05/2016,at 2:21 PM

## 2016-09-06 ENCOUNTER — Telehealth: Payer: Self-pay | Admitting: *Deleted

## 2016-09-06 ENCOUNTER — Encounter: Payer: Self-pay | Admitting: *Deleted

## 2016-09-06 DIAGNOSIS — J449 Chronic obstructive pulmonary disease, unspecified: Secondary | ICD-10-CM

## 2016-09-06 LAB — HEMOGLOBIN A1C
Hgb A1c MFr Bld: 6.4 % — ABNORMAL HIGH (ref 4.8–5.6)
MEAN PLASMA GLUCOSE: 137 mg/dL

## 2016-09-06 NOTE — Telephone Encounter (Signed)
Opened in error

## 2016-09-06 NOTE — Telephone Encounter (Signed)
Darius Norris called to let us know that he had been out sick.  He was admitted to hospital for observation over night with increased shortness of breath.  He hopes to be able to return to rehab on Monday.

## 2016-09-06 NOTE — Progress Notes (Signed)
Admitted for sobm,cough,wheezing.improved with nebs,and steoroids.eager to go home.troponin negative for 3 times [facility] [place] NAME: Darius Norris    MR#:  [id number]  DATE OF BIRTH:  1951/03/27  SUBJECTIVE:  CHIEF COMPLAINT:   Chief Complaint  Patient presents with  . Shortness of Breath  . Cough    REVIEW OF SYSTEMS:   ROS CONSTITUTIONAL: No fever, fatigue or weakness.  EYES: No blurred or double vision.  EARS, NOSE, AND THROAT: No tinnitus or ear pain.  RESPIRATORY: No cough, shortness of breath, wheezing or hemoptysis.  CARDIOVASCULAR: No chest pain, orthopnea, edema.  GASTROINTESTINAL: No nausea, vomiting, diarrhea or abdominal pain.  GENITOURINARY: No dysuria, hematuria.  ENDOCRINE: No polyuria, nocturia,  HEMATOLOGY: No anemia, easy bruising or bleeding SKIN: No rash or lesion. MUSCULOSKELETAL: No joint pain or arthritis.   NEUROLOGIC: No tingling, numbness, weakness.  PSYCHIATRY: No anxiety or depression.   DRUG ALLERGIES:   Allergies  Allergen Reactions  . Zantac [Ranitidine Hcl] Shortness Of Breath  . Codeine Itching  . Erythromycin Hives  . Vicodin [Hydrocodone-Acetaminophen] Rash    VITALS:  Blood pressure (!) 154/79, pulse 65, temperature 97.6 F (36.4 C), temperature source Oral, resp. rate 17, height 5\' 6"  (1.676 m), weight 93.4 kg (206 lb), SpO2 95 %.  PHYSICAL EXAMINATION:  GENERAL:  65 y.o.-year-old patient lying in the bed with no acute distress.  EYES: Pupils equal, round, reactive to light and accommodation. No scleral icterus. Extraocular muscles intact.  HEENT: Head atraumatic, normocephalic. Oropharynx and nasopharynx clear.  NECK:  Supple, no jugular venous distention. No thyroid enlargement, no tenderness.  LUNGS: Normal breath sounds bilaterally, no wheezing, rales,rhonchi or crepitation. No use of accessory muscles of respiration.  CARDIOVASCULAR: S1, S2 normal. No murmurs, rubs,  or gallops.  ABDOMEN: Soft, nontender, nondistended. Bowel sounds present. No organomegaly or mass.  EXTREMITIES: No pedal edema, cyanosis, or clubbing.  NEUROLOGIC: Cranial nerves II through XII are intact. Muscle strength 5/5 in all extremities. Sensation intact. Gait not checked.  PSYCHIATRIC: The patient is alert and oriented x 3.  SKIN: No obvious rash, lesion, or ulcer.    LABORATORY PANEL:   CBC  Recent Labs Lab 09/05/16 0733  WBC 9.9  HGB 15.7  HCT 46.0  PLT 197   ------------------------------------------------------------------------------------------------------------------  Chemistries   Recent Labs Lab 09/04/16 1716 09/05/16 0733  NA 139 139  K 3.2* 3.3*  CL 101 102  CO2 30 32  GLUCOSE 158* 106*  BUN 13 14  CREATININE 1.03 0.92  CALCIUM 8.8* 8.8*  MG 2.0  --   AST 20  --   ALT 20  --   ALKPHOS 103  --   BILITOT 0.5  --    ------------------------------------------------------------------------------------------------------------------  Cardiac Enzymes  Recent Labs Lab 09/05/16 0733  TROPONINI <0.03   ------------------------------------------------------------------------------------------------------------------  RADIOLOGY:  No results found.  EKG:   Orders placed or performed during the hospital encounter of 09/04/16  . ED EKG  . ED EKG  . EKG    ASSESSMENT AND PLAN:   Sob  Due to acute bronchitis;clinically better,on solumedrol,,discharged home with prednisone 2.chest pain;tropoins are negative,no further cardiac work up is recommended by cardio 3.htn;uncontrolled when came but got better    All the records are reviewed and case discussed with Care Management/Social Workerr. Management plans discussed with the patient, family and they are in agreement.  CODE STATUS: full  TOTAL TIME TAKING CARE OF THIS PATIENT: 35 minutes.  D/c home today   Jinny Sweetland M.D on 09/06/2016 at 9:45 PM  Between 7am to 6pm - Pager -  [phone]  After 6pm go to [email or web address] - password [email or web address] [facility]  Office  [phone]  CC: Primary care physician; Pcp Not In System   Note: This dictation was prepared with Dragon dictation along with smaller phrase technology. Any transcriptional errors that result from this process are unintentional.

## 2016-09-07 LAB — CULTURE, RESPIRATORY W GRAM STAIN

## 2016-09-07 LAB — CULTURE, RESPIRATORY: CULTURE: NORMAL

## 2016-09-08 NOTE — Discharge Summary (Signed)
Darius Norris, is a 65 y.o. male  DOB 12/02/1951  MRN [id number].  Admission date:  09/04/2016  Admitting Physician  Theodoro Grist, MD  Discharge Date:  09/05/2016   Primary MD  Pcp Not In System  Recommendations for primary care physician for things to follow:   Follow up with  physician at [facility] in 1 week.   Admission Diagnosis  Abnormal EKG [R94.31] COPD exacerbation (HCC) [J44.1] Chest pain, unspecified chest pain type [R07.9]   Discharge Diagnosis  Abnormal EKG [R94.31] COPD exacerbation (HCC) [J44.1] Chest pain, unspecified chest pain type [R07.9]   Active Problems:   Unstable angina (HCC)   Uncontrolled hypertension   Hypokalemia   Leukocytosis   Hyperglycemia      Past Medical History:  Diagnosis Date  . CAD (coronary artery disease)   . Colitis   . COPD (chronic obstructive pulmonary disease) ([place])   . Emphysema lung ([place])   . GERD (gastroesophageal reflux disease)   . Hypertension     Past Surgical History:  Procedure Laterality Date  . ABDOMINAL HERNIA REPAIR Bilateral        History of present illness and  Hospital Course:     Kindly see H&P for history of present illness and admission details, please review complete Labs, Consult reports and Test reports for all details in brief  HPI  from the history and physical done on the day of admission 65 year old male patient with history of COPD admitted because of cough, shortness of breath, wheezing some chest pain. Patient EKG revealed ST depression in inferior, diffuse anterolateral leads concerning this. Patient is admitted to telemetry. Started on heparin drip.   Hospital Course  #1 chest pain due to unstable angina: Started on heparin drip. Started continued on aspirin, metoprolol. Cardiac enzymes are negative for 3 times. Seen by  cardiology Dr. Ubaldo Glassing, Did not recommend any further workup. Chest pain now resolved  Next day.. Patient says that  He had the pleuritic chest pain but never had chest pain that is like a heart attack. And he did not want any further cardiac workup while in the hospital. Patient can follow up with Dr. Saralyn Pilar as an outpatient. #2. Acute bronchitis; patient started on steroid taper, added Levaquin. Discharge home with this.  Pt  was given steroids, amoxicillin ,steroids recently. "leucocytosis  is secondary to steroids. #3 history of GERD continue PPIs #4 history of coronary artery disease: Patient had chest pain but it's related to COPD and bronchitis. Troponins are negative. No further cardiac workup is recommended by cardiology. #5 history of emphysema and patient takes Symbicort, albuterol, #6 hypertension, CAD: Continued on statins, nitrates, beta blockers. #7 hypokalemia corrected. #8. History of anxiety continue BuSpar. Patient gets pulmonary rehabilitation for COPD.  Discharge Condition: Stable   Follow UP  Follow-up Information    PARASCHOS,ALEXANDER, MD Follow up in 1 week(s).   Specialty:  Cardiology Why:  Dr Saralyn Pilar office will call you with an appointment at home, ccs Contact information: [facility] [place] [address] [phone]             Discharge Instructions  and  Discharge Medications        Medication List    STOP taking these medications   amoxicillin 500 MG capsule Commonly known as:  AMOXIL   predniSONE 20 MG tablet Commonly known as:  DELTASONE Replaced by:  predniSONE 10 MG (21) Tbpk tablet     TAKE these medications  acetaminophen 325 MG tablet Commonly known as:  TYLENOL Take 975 mg by mouth 3 (three) times daily.   amitriptyline 10 MG tablet Commonly known as:  ELAVIL Take 30 mg by mouth at bedtime.   atorvastatin 80 MG tablet Commonly known as:  LIPITOR Take 40 mg by mouth at  bedtime.   budesonide-formoterol 160-4.5 MCG/ACT inhaler Commonly known as:  SYMBICORT Inhale 2 puffs into the lungs 2 (two) times daily.   busPIRone 10 MG tablet Commonly known as:  BUSPAR Take 10 mg by mouth 2 (two) times daily.   clobetasol cream 0.05 % Commonly known as:  TEMOVATE Apply 1 application topically daily as needed. For itching on psoriasis areas   COMBIVENT RESPIMAT 20-100 MCG/ACT Aers respimat Generic drug:  Ipratropium-Albuterol Inhale 1 puff into the lungs every 6 (six) hours as needed for wheezing or shortness of breath.   diclofenac sodium 1 % Gel Commonly known as:  VOLTAREN Apply 2 g topically 2 (two) times daily.   finasteride 5 MG tablet Commonly known as:  PROSCAR Take 5 mg by mouth daily.   fluticasone 50 MCG/ACT nasal spray Commonly known as:  FLONASE Place 2 sprays into both nostrils daily.   furosemide 20 MG tablet Commonly known as:  LASIX Take 1 tablet (20 mg total) by mouth daily.   ipratropium 0.02 % nebulizer solution Commonly known as:  ATROVENT Take 2.5 mLs (0.5 mg total) by nebulization every 6 (six) hours as needed for wheezing or shortness of breath.   isosorbide mononitrate 60 MG 24 hr tablet Commonly known as:  IMDUR Take 60 mg by mouth daily.   levofloxacin 750 MG tablet Commonly known as:  LEVAQUIN Take 1 tablet (750 mg total) by mouth daily at 6 PM. What changed:  when to take this   levofloxacin 750 MG tablet Commonly known as:  LEVAQUIN Take 1 tablet (750 mg total) by mouth daily. What changed:  You were already taking a medication with the same name, and this prescription was added. Make sure you understand how and when to take each.   lisinopril 20 MG tablet Commonly known as:  PRINIVIL,ZESTRIL Take 10 mg by mouth daily.   loratadine 10 MG tablet Commonly known as:  CLARITIN Take 10 mg by mouth daily.   metoprolol 50 MG tablet Commonly known as:  LOPRESSOR Take 100 mg by mouth 2 (two) times daily.    omeprazole 20 MG capsule Commonly known as:  PRILOSEC Take 40 mg by mouth daily.   predniSONE 10 MG (21) Tbpk tablet Commonly known as:  STERAPRED UNI-PAK 21 TAB Take 1 tablet (10 mg total) by mouth daily. Taper as prescribed Replaces:  predniSONE 20 MG tablet   salicyclic acid-sulfur 2-2 % shampoo Commonly known as:  SEBULEX Apply 1 application topically daily.   senna-docusate 8.6-50 MG tablet Commonly known as:  Senokot-S Take 2 tablets by mouth 2 (two) times daily.   simethicone 80 MG chewable tablet Commonly known as:  MYLICON Chew 80 mg by mouth 3 (three) times daily as needed for flatulence.         Diet and Activity recommendation: See Discharge Instructions above   Consults obtained -Cardiology   Major procedures and Radiology Reports - PLEASE review detailed and final reports for all details, in brief -     Dg Chest 2 View  Result Date: 09/04/2016 CLINICAL DATA:  Short of breath EXAM: CHEST  2 VIEW COMPARISON:  04/16/2016 FINDINGS: Chronic lung disease with COPD and apical emphysema. Crowded  vascular markings in lung bases unchanged. Negative for pneumonia. Negative for heart failure or effusion. No change from the prior study. IMPRESSION: COPD without acute cardiopulmonary abnormality.  Apical emphysema. Electronically Signed   By: Franchot Gallo M.D.   On: 09/04/2016 17:41    Micro Results     Recent Results (from the past 240 hour(s))  Culture, expectorated sputum-assessment     Status: None   Collection Time: 09/05/16  6:35 AM  Result Value Ref Range Status   Specimen Description EXPECTORATED SPUTUM  Final   Special Requests NONE  Final   Sputum evaluation THIS SPECIMEN IS ACCEPTABLE FOR SPUTUM CULTURE  Final   Report Status 09/05/2016 FINAL  Final  Culture, respiratory (NON-Expectorated)     Status: None   Collection Time: 09/05/16  6:35 AM  Result Value Ref Range Status   Specimen Description EXPECTORATED SPUTUM  Final   Special Requests NONE  Reflexed from [id number]  Final   Gram Stain   Final    ABUNDANT WBC PRESENT,BOTH PMN AND MONONUCLEAR ABUNDANT GRAM POSITIVE COCCI IN CHAINS IN PAIRS RARE YEAST    Culture   Final    Consistent with normal respiratory flora. Performed at [facility]    Report Status 09/07/2016 FINAL  Final       Today   Subjective:   Darius Norris today has no headache,no chest abdominal pain,no new weakness tingling or numbness, feels much better wants to go home today.   Objective:   Blood pressure (!) 154/79, pulse 65, temperature 97.6 F (36.4 C), temperature source Oral, resp. rate 17, height 5\' 6"  (1.676 m), weight 93.4 kg (206 lb), SpO2 95 %.  No intake or output data in the 24 hours ending 09/08/16 1825  Exam Awake Alert, Oriented x 3, No new F.N deficits, Normal affect .AT,PERRAL Supple Neck,No JVD, No cervical lymphadenopathy appriciated.  Symmetrical Chest wall movement, Good air movement bilaterally, CTAB RRR,No Gallops,Rubs or new Murmurs, No Parasternal Heave +ve B.Sounds, Abd Soft, Non tender, No organomegaly appriciated, No rebound -guarding or rigidity. No Cyanosis, Clubbing or edema, No new Rash or bruise  Data Review   CBC w Diff:  Lab Results  Component Value Date   WBC 9.9 09/05/2016   HGB 15.7 09/05/2016   HGB 16.0 02/04/2013   HCT 46.0 09/05/2016   HCT 48.0 02/04/2013   PLT 197 09/05/2016   PLT 228 02/04/2013   LYMPHOPCT 10 09/04/2016   LYMPHOPCT 14.7 02/04/2013   MONOPCT 7 09/04/2016   MONOPCT 7.2 02/04/2013   EOSPCT 1 09/04/2016   EOSPCT 1.2 02/04/2013   BASOPCT 1 09/04/2016   BASOPCT 1.2 02/04/2013    CMP:  Lab Results  Component Value Date   NA 139 09/05/2016   NA 138 02/04/2013   K 3.3 (L) 09/05/2016   K 4.0 02/04/2013   CL 102 09/05/2016   CL 104 02/04/2013   CO2 32 09/05/2016   CO2 28 02/04/2013   BUN 14 09/05/2016   BUN 8 02/04/2013   CREATININE 0.92 09/05/2016   CREATININE 1.02 02/04/2013   PROT 6.6 09/04/2016   PROT 7.9  02/04/2013   ALBUMIN 3.7 09/04/2016   ALBUMIN 3.9 02/04/2013   BILITOT 0.5 09/04/2016   BILITOT 0.5 02/04/2013   ALKPHOS 103 09/04/2016   ALKPHOS 130 02/04/2013   AST 20 09/04/2016   AST 32 02/04/2013   ALT 20 09/04/2016   ALT 32 02/04/2013  .   Total Time in preparing paper work, data evaluation and todays  exam - 34 minutes  Priyana Mccarey M.D on 09/05/2016 at 6:25 PM    Note: This dictation was prepared with Dragon dictation along with smaller phrase technology. Any transcriptional errors that result from this process are unintentional.

## 2016-09-13 DIAGNOSIS — J449 Chronic obstructive pulmonary disease, unspecified: Secondary | ICD-10-CM | POA: Diagnosis not present

## 2016-09-13 NOTE — Progress Notes (Signed)
Daily Session Note  Patient Details  Name: Darius Norris MRN: [id number] Date of Birth: Jun 07, 1951 Referring Provider:   Arn Medal Row Documentation from 09/06/2016 in [facility] Cardiac and Pulmonary Rehab  Referring Provider  [facility]      Encounter Date: 09/13/2016  Check In:     Session Check In - 09/13/16 1256      Check-In   Location [facility]-Cardiac & Pulmonary Rehab   Staff Present Heath Lark, RN, BSN, CCRP;Laureen Owens Shark, BS, RRT, Respiratory Dareen Piano, BA, ACSM CEP, Exercise Physiologist   Supervising physician immediately available to respond to emergencies [name] immediately available ER MD   Physician(s) Alfred Levins and Joni Fears   Medication changes reported     No   Fall or balance concerns reported    No   Warm-up and Cool-down Performed as group-led Location manager Performed Yes   VAD Patient? No     Pain Assessment   Currently in Pain? No/denies         Goals Met:  Proper associated with RPD/PD & O2 Sat Independence with exercise equipment Exercise tolerated well Strength training completed today  Goals Unmet:  Not Applicable  Comments: Pt able to follow exercise prescription today without complaint.  Will continue to monitor for progression.    Dr. Emily Filbert is Medical Director for [place] and [name] Pulmonary Rehabilitation.

## 2016-09-15 ENCOUNTER — Encounter: Payer: Non-veteran care | Admitting: Respiratory Therapy

## 2016-09-15 DIAGNOSIS — J449 Chronic obstructive pulmonary disease, unspecified: Secondary | ICD-10-CM

## 2016-09-15 NOTE — Progress Notes (Signed)
Daily Session Note  Patient Details  Name: REVERE MAAHS MRN: [id number] Date of Birth: 05-30-1951 Referring Provider:   Arn Medal Row Documentation from 09/06/2016 in [facility] Cardiac and Pulmonary Rehab  Referring Provider  [facility]      Encounter Date: 09/15/2016  Check In:     Session Check In - 09/15/16 1049      Check-In   Staff Present Nyoka Cowden, RN, BSN, MA;Susanne Bice, RN, BSN, CCRP;Tauheed Mcfayden Blanch Media, RRT, RCP, Respiratory Therapist   Supervising physician immediately available to respond to emergencies [name] immediately available ER MD   Physician(s) Dr. Edd Fabian & Marcelene Butte   Medication changes reported     No   Fall or balance concerns reported    No   Warm-up and Cool-down Performed on first and last piece of equipment   Resistance Training Performed Yes   VAD Patient? No     Pain Assessment   Currently in Pain? No/denies         Goals Met:  Proper associated with RPD/PD & O2 Sat Independence with exercise equipment Using PLB without cueing & demonstrates good technique Exercise tolerated well Strength training completed today  Goals Unmet:  Not Applicable  Comments: Pt able to follow exercise prescription today without complaint.  Will continue to monitor for progression.    Dr. Emily Filbert is Medical Director for [place] and [name]monary Rehabilitation.

## 2016-09-18 ENCOUNTER — Encounter: Payer: Non-veteran care | Attending: Internal Medicine | Admitting: *Deleted

## 2016-09-18 ENCOUNTER — Encounter: Payer: Self-pay | Admitting: Respiratory Therapy

## 2016-09-18 DIAGNOSIS — J449 Chronic obstructive pulmonary disease, unspecified: Secondary | ICD-10-CM

## 2016-09-18 NOTE — Progress Notes (Signed)
Daily Session Note  Patient Details  Name: Darius Norris MRN: [id number] Date of Birth: Feb 27, 1951 Referring Provider:   Arn Medal Row Documentation from 09/06/2016 in [facility] Cardiac and Pulmonary Rehab  Referring Provider  [facility]      Encounter Date: 09/18/2016  Check In:     Session Check In - 09/18/16 1117      Check-In   Location [facility]-Cardiac & Pulmonary Rehab   Staff Present Carson Myrtle, BS, RRT, Respiratory Bertis Ruddy, BS, ACSM CEP, Exercise Physiologist;Amanda Oletta Darter, BA, ACSM CEP, Exercise Physiologist   Supervising physician immediately available to respond to emergencies [name] immediately available ER MD   Physician(s) Dr. Jacqualine Code and Dr. Reita Cliche   Medication changes reported     No   Fall or balance concerns reported    No   Warm-up and Cool-down Performed on first and last piece of equipment   Resistance Training Performed Yes   VAD Patient? No     Pain Assessment   Currently in Pain? No/denies   Multiple Pain Sites No         Goals Met:  Proper associated with RPD/PD & O2 Sat Independence with exercise equipment Exercise tolerated well Strength training completed today  Goals Unmet:  Not Applicable  Comments: Pt able to follow exercise prescription today without complaint.  Will continue to monitor for progression.    Dr. Emily Filbert is Medical Director for [place] and [name].

## 2016-09-18 NOTE — Progress Notes (Signed)
Pulmonary Individual Treatment Plan  Patient Details  Name: Darius Norris MRN: [id number] Date of Birth: Mar 29, 1951 Referring Provider:   [place] Documentation from 09/06/2016 in [facility] Cardiac and Pulmonary Rehab  Referring Provider  [facility]      Initial Encounter Date:  [place] Documentation from 09/06/2016 in [facility] Cardiac and Pulmonary Rehab  Date  07/17/16  Referring Provider  [facility]      Visit Diagnosis: Moderate COPD (chronic obstructive pulmonary disease) ([place])  Patient's Home Medications on Admission:  Current Outpatient Prescriptions:    acetaminophen (TYLENOL) 325 MG tablet, Take 975 mg by mouth 3 (three) times daily., Disp: , Rfl:    amitriptyline (ELAVIL) 10 MG tablet, Take 30 mg by mouth at bedtime., Disp: , Rfl:    atorvastatin (LIPITOR) 80 MG tablet, Take 40 mg by mouth at bedtime., Disp: , Rfl:    budesonide-formoterol (SYMBICORT) 160-4.5 MCG/ACT inhaler, Inhale 2 puffs into the lungs 2 (two) times daily., Disp: , Rfl:    busPIRone (BUSPAR) 10 MG tablet, Take 10 mg by mouth 2 (two) times daily., Disp: , Rfl:    clobetasol cream (TEMOVATE) 2.63 %, Apply 1 application topically daily as needed. For itching on psoriasis areas, Disp: , Rfl:    diclofenac sodium (VOLTAREN) 1 % GEL, Apply 2 g topically 2 (two) times daily., Disp: , Rfl:    finasteride (PROSCAR) 5 MG tablet, Take 5 mg by mouth daily., Disp: , Rfl:    fluticasone (FLONASE) 50 MCG/ACT nasal spray, Place 2 sprays into both nostrils daily., Disp: , Rfl:    furosemide (LASIX) 20 MG tablet, Take 1 tablet (20 mg total) by mouth daily., Disp: 30 tablet, Rfl: 0   ipratropium (ATROVENT) 0.02 % nebulizer solution, Take 2.5 mLs (0.5 mg total) by nebulization every 6 (six) hours as needed for wheezing or shortness of breath., Disp: 75 mL, Rfl: 12   Ipratropium-Albuterol (COMBIVENT RESPIMAT) 20-100 MCG/ACT AERS respimat, Inhale 1 puff into the lungs every 6 (six) hours as needed  for wheezing or shortness of breath., Disp: , Rfl:    isosorbide mononitrate (IMDUR) 60 MG 24 hr tablet, Take 60 mg by mouth daily., Disp: , Rfl:    levofloxacin (LEVAQUIN) 750 MG tablet, Take 1 tablet (750 mg total) by mouth daily at 6 PM., Disp: 5 tablet, Rfl: 0   levofloxacin (LEVAQUIN) 750 MG tablet, Take 1 tablet (750 mg total) by mouth daily., Disp: 4 tablet, Rfl: 0   lisinopril (PRINIVIL,ZESTRIL) 20 MG tablet, Take 10 mg by mouth daily., Disp: , Rfl:    loratadine (CLARITIN) 10 MG tablet, Take 10 mg by mouth daily., Disp: , Rfl:    metoprolol (LOPRESSOR) 50 MG tablet, Take 100 mg by mouth 2 (two) times daily., Disp: , Rfl:    omeprazole (PRILOSEC) 20 MG capsule, Take 40 mg by mouth daily., Disp: , Rfl:    predniSONE (STERAPRED UNI-PAK 21 TAB) 10 MG (21) TBPK tablet, Take 1 tablet (10 mg total) by mouth daily. Taper as prescribed, Disp: 21 tablet, Rfl: 0   salicyclic acid-sulfur (SEBULEX) 2-2 % shampoo, Apply 1 application topically daily., Disp: , Rfl:    senna-docusate (SENOKOT-S) 8.6-50 MG tablet, Take 2 tablets by mouth 2 (two) times daily., Disp: , Rfl:    simethicone (MYLICON) 80 MG chewable tablet, Chew 80 mg by mouth 3 (three) times daily as needed for flatulence., Disp: , Rfl:   Past Medical History: Past Medical History:  Diagnosis Date   CAD (coronary artery  disease)    Colitis    COPD (chronic obstructive pulmonary disease) (HCC)    Emphysema lung (HCC)    GERD (gastroesophageal reflux disease)    Hypertension     Tobacco Use: History  Smoking Status   Former Smoker  Smokeless Tobacco   Never Used    Labs: Recent [employer] for ITP Cardiac and Pulmonary Rehab Latest Ref Rng & Units 02/04/2013 09/04/2016   Cholestrol 0 - 200 mg/dL 185 116   LDLCALC 0 - 99 mg/dL 114(H) 51   HDL >40 mg/dL 38(L) 42   Trlycerides <150 mg/dL 166 116   Hemoglobin A1c 4.8 - 5.6 % - 6.4(H)       ADL UCSD:     Pulmonary Assessment Scores     Row Name 06/27/16 1131         ADL UCSD   ADL Phase Entry     SOB Score total 50     Rest 1     Walk 3     Stairs 2     Bath 4     Dress 2     Shop 2        Pulmonary Function Assessment:     Pulmonary Function Assessment - 06/27/16 1129      Initial Spirometry Results   FVC% 40 %   FEV1% 34 %   FEV1/FVC Ratio 65.59     Post Bronchodilator Spirometry Results   FVC% 49 %   FEV1% 24 %   FEV1/FVC Ratio 56.11     Breath   Shortness of Breath Yes;Fear of Shortness of Breath;Limiting activity      Exercise Target Goals:    Exercise Program Goal: Individual exercise prescription set with THRR, safety & activity barriers. Participant demonstrates ability to understand and report RPE using BORG scale, to self-measure pulse accurately, and to acknowledge the importance of the exercise prescription.  Exercise Prescription Goal: Starting with aerobic activity 30 plus minutes a day, 3 days per week for initial exercise prescription. Provide home exercise prescription and guidelines that participant acknowledges understanding prior to discharge.  Activity Barriers & Risk Stratification:     Activity Barriers & Cardiac Risk Stratification - 06/27/16 1128      Activity Barriers & Cardiac Risk Stratification   Activity Barriers Shortness of Breath;Deconditioning   Cardiac Risk Stratification Moderate      6 Minute Walk:     6 Minute Walk    Row Name 06/27/16 1034         6 Minute Walk   Distance 500 feet     Walk Time 4 minutes     MPH 1.42     METS 1.6     RPE 15     Perceived Dyspnea  4     VO2 Peak 5.5     Symptoms Yes (comment)     Comments low back pain more than breathing     Resting HR 69 bpm     Resting BP 138/74     Max Ex. HR 87 bpm     Max Ex. BP 152/72        Initial Exercise Prescription:     Initial Exercise Prescription - 09/06/16 1400      Date of Initial Exercise RX and Referring Provider   Date 07/17/16   Referring Provider [facility]      Perform Capillary Blood Glucose checks as needed.  Exercise Prescription Changes:  Exercise Prescription Changes    Row Name 07/17/16 1100 07/19/16 1400 08/01/16 1500 08/02/16 1400 08/16/16 1500     Exercise Review   Progression  -- Yes Yes Yes Yes     Response to Exercise   Blood Pressure (Admit) 140/78 120/70 120/66  -- 138/68   Blood Pressure (Exercise) 132/70 164/76 164/80  -- 152/70   Blood Pressure (Exit) 138/70 130/64 128/68  -- 122/64   Heart Rate (Admit) 78 bpm 73 bpm 88 bpm  -- 82 bpm   Heart Rate (Exercise) 101 bpm 84 bpm 87 bpm  -- 77 bpm   Heart Rate (Exit) 87 bpm 74 bpm 72 bpm  -- 69 bpm   Oxygen Saturation (Admit) 96 % 92 % 98 %  -- 96 %   Oxygen Saturation (Exercise) 92 % 91 % 95 %  -- 94 %   Oxygen Saturation (Exit) 95 % 95 % 96 %  -- 96 %   Rating of Perceived Exertion (Exercise) 14 13 13   -- 12   Perceived Dyspnea (Exercise) 4 3 4   -- 33   Symptoms  --  --  -- Home Exercise Guidelines given 08/02/16 Home Exercise Guidelines given 08/02/16   Comments  --  -- none none none   Duration Progress to 30 minutes of continuous aerobic without signs/symptoms of physical distress Progress to 45 minutes of aerobic exercise without signs/symptoms of physical distress Progress to 45 minutes of aerobic exercise without signs/symptoms of physical distress Progress to 45 minutes of aerobic exercise without signs/symptoms of physical distress Progress to 45 minutes of aerobic exercise without signs/symptoms of physical distress   Intensity THRR unchanged  103-139 THRR unchanged THRR unchanged THRR unchanged THRR unchanged     Progression   Progression Continue progressive overload as per policy without signs/symptoms or physical distress. Continue to progress workloads to maintain intensity without signs/symptoms of physical distress. Continue to progress workloads to maintain intensity without signs/symptoms of physical distress. Continue to progress  workloads to maintain intensity without signs/symptoms of physical distress. Continue to progress workloads to maintain intensity without signs/symptoms of physical distress.   Average METs 1.9 2 2.03 2.03 2.23     Resistance Training   Training Prescription Yes Yes Yes Yes Yes   Weight 3 3 4  lbs 4 lbs 4 lbs   Reps 10-15 10-15 10-15 10-15 10-15     Interval Training   Interval Training  -- No No No No     Oxygen   Oxygen Continuous Continuous Continuous Continuous Continuous   Liters 2 2 2 2 2      Recumbant Bike   Level 1 --  omitted due to time today 1.5 1.5 1.5   RPM 30  -- 45 45 42   Minutes 6  3-3 intervals  -- 13  8 5 13  8 5 11    METs 1.5  -- 2.3 2.3 2.3     REL-XR   Level 2 2 3 3 3    Minutes 9 15 15 15 15    METs 1.9 2 1.8 1.8 2.4     Biostep-RELP   Level 1 1 3 3 3    Minutes 10 15 15 15 15    METs 1 2 2 2 2      Home Exercise Plan   Plans to continue exercise at  --  --  -- Home  walking and stationary bike Home  walking and stationary bike   Frequency  --  --  -- Add 1 additional  day to program exercise sessions. Add 1 additional day to program exercise sessions.   [place] Name 08/29/16 1400 09/13/16 1200           Exercise Review   Progression Yes Yes        Response to Exercise   Blood Pressure (Admit) 132/78 130/60      Blood Pressure (Exercise) 140/68 140/70      Blood Pressure (Exit) 136/62 116/64      Heart Rate (Admit) 75 bpm 83 bpm      Heart Rate (Exercise) 78 bpm 81 bpm      Heart Rate (Exit) 69 bpm 70 bpm      Oxygen Saturation (Admit) 89 % 91 %      Oxygen Saturation (Exercise) 97 % 96 %      Oxygen Saturation (Exit) 95 % 98 %      Rating of Perceived Exertion (Exercise) 12 14      Perceived Dyspnea (Exercise) 3 3      Symptoms Home Exercise Guidelines given 08/02/16  --      Comments none none      Duration Progress to 45 minutes of aerobic exercise without signs/symptoms of physical distress Progress to 45 minutes of aerobic exercise without  signs/symptoms of physical distress      Intensity THRR unchanged THRR unchanged        Progression   Progression Continue to progress workloads to maintain intensity without signs/symptoms of physical distress. Continue to progress workloads to maintain intensity without signs/symptoms of physical distress.      Average METs 2.07 2.1        Resistance Training   Training Prescription Yes Yes      Weight 4 lbs 4      Reps 10-15 10-15        Interval Training   Interval Training No Yes        Oxygen   Oxygen Continuous Continuous      Liters 2 2        Recumbant Bike   Level 1.5 2      RPM 53  --      Minutes 15 15      METs 2.3 2.5        REL-XR   Level 3 3      Minutes 15 15      METs 1.9 1.7        Biostep-RELP   Level 4 4      Minutes 15 15      METs 2  --        Home Exercise Plan   Plans to continue exercise at Home  walking and stationary bike  --      Frequency Add 2 additional days to program exercise sessions.  --         Exercise Comments:     Exercise Comments    Row Name 07/17/16 1136 07/19/16 1442 08/01/16 1535 08/02/16 1118 08/16/16 1509   Exercise Comments Today was the patient's first day of class. The patient's initial exercise prescription (based on the 6 min walk evaluation) was reviewed with the patient. [name] is off to a good start with exercise.  He had now done two sessions.  He already made some improvement.  We will continue to monitor for improvement. [name] continues to do well with exercise. He is now up to 8 minutes on the recumbent bike continuosly.  We wil continue to monitor for progression. Reviewed  home exercise with pt today.  Pt plans to walk and use stationary bike at home for exercise.  Reviewed THR, pulse, RPE, sign and symptoms, and when to call 911 or MD.  Also discussed weather considerations and indoor options.  Pt voiced understanding. [name] is doing well with his exercise.  We are still working on building up time on the  recumbent bike.  We will continue to monitor for progression.   [place] Name 08/29/16 1423 09/13/16 1300         Exercise Comments [name] continues to do well with exercise.  He is now doing level 4 on the BioStep!  We will continue to monitor for progression. [name] is proressing well with exercise.         Discharge Exercise Prescription (Final Exercise Prescription Changes):     Exercise Prescription Changes - 09/13/16 1200      Exercise Review   Progression Yes     Response to Exercise   Blood Pressure (Admit) 130/60   Blood Pressure (Exercise) 140/70   Blood Pressure (Exit) 116/64   Heart Rate (Admit) 83 bpm   Heart Rate (Exercise) 81 bpm   Heart Rate (Exit) 70 bpm   Oxygen Saturation (Admit) 91 %   Oxygen Saturation (Exercise) 96 %   Oxygen Saturation (Exit) 98 %   Rating of Perceived Exertion (Exercise) 14   Perceived Dyspnea (Exercise) 3   Comments none   Duration Progress to 45 minutes of aerobic exercise without signs/symptoms of physical distress   Intensity THRR unchanged     Progression   Progression Continue to progress workloads to maintain intensity without signs/symptoms of physical distress.   Average METs 2.1     Resistance Training   Training Prescription Yes   Weight 4   Reps 10-15     Interval Training   Interval Training Yes     Oxygen   Oxygen Continuous   Liters 2     Recumbant Bike   Level 2   Minutes 15   METs 2.5     REL-XR   Level 3   Minutes 15   METs 1.7     Biostep-RELP   Level 4   Minutes 15       Nutrition:  Target Goals: Understanding of nutrition guidelines, daily intake of sodium <1585m, cholesterol <2057m calories 30% from fat and 7% or less from saturated fats, daily to have 5 or more servings of fruits and vegetables.  Biometrics:     Pre Biometrics - 06/27/16 1034      Pre Biometrics   Height 5' 6"  (1.676 m)   Weight 204 lb 6.4 oz (92.7 kg)   Waist Circumference 49 inches   Hip Circumference 48 inches    Waist to Hip Ratio 1.02 %   BMI (Calculated) 33.1       Nutrition Therapy Plan and Nutrition Goals:   Nutrition Discharge: Rate Your Plate Scores:   Psychosocial: Target Goals: Acknowledge presence or absence of depression, maximize coping skills, provide positive support system. Participant is able to verbalize types and ability to use techniques and skills needed for reducing stress and depression.  Initial Review & Psychosocial Screening:     Initial Psych Review & Screening - 06/27/16 [place]   Comments Mr RiSaulsas good support from his wife. He admits to panic attacks with shortness of breath and has recently been set-up with a therapist for  counciling. He states that this treatment will help along with [name] exercise with suppervision.     Barriers   Psychosocial barriers to participate in program The patient should benefit from training in stress management and relaxation.     Screening Interventions   Interventions Encouraged to exercise;Program counselor consult      Quality of Life Scores:     Quality of Life - 06/27/16 1145      Quality of Life Scores   Health/Function Pre 19.5 %   Socioeconomic Pre 19.17 %   Psych/Spiritual Pre 19 %   Family Pre 20.6 %   GLOBAL Pre 19.5 %      PHQ-9: Recent Review Flowsheet Data    Depression screen [facility] 2/9 06/27/2016   Decreased Interest 0   Down, Depressed, Hopeless 0   PHQ - 2 Score 0   Altered sleeping 1   Tired, decreased energy 2   Change in appetite 1   Feeling bad or failure about yourself  0   Trouble concentrating 0   Moving slowly or fidgety/restless 0   Suicidal thoughts 0   PHQ-9 Score 4   Difficult doing work/chores Somewhat difficult      Psychosocial Evaluation and Intervention:     Psychosocial Evaluation - 07/17/16 1058      Psychosocial Evaluation & Interventions   Comments Counselor met with Mr. Corrales today for initial  psychosocial evaluation.   He was in this program approximately 1 year ago and reports having a "bad episode" about a month ago which resulted in his Dr. recommending a return to Pulmonary Rehab.  Mr. Toledo has COPD and is otherwise in fairly good health.  He has a spouse of 61 years, an adult daughter and several sisters who live close by and are his strong support system.  Mr. Saindon states he sleeps okay with the help of OTC Melatonin at night as needed.  His appetite is good as well.  Mr. Hayter reports a recent history of panic attacks resulting in his psychiatrist adding a medication for this which he states is Buspiron @ 90m.  This counselor did not see this medication on Mr. Higginbotham's list and asked him to bring it to next class for a nurse to add it to the medication list.  He states this has been helpful in addressing these symptoms.  Mr. RRogusreports his mood is "okay" and his health is his primary stress at this time.  Mr. RRudinhas goals to lose weight; breathe better; and increase his stamina while in this program.  Counselor encouraged Mr. ROnoto begin considering how he will maintain exercising consistently upon completion of this program and he agreed to work on this as a goal as well.        Psychosocial Re-Evaluation:     Psychosocial Re-Evaluation    RPort O'ConnorName 07/31/16 1136 08/30/16 1031           Psychosocial Re-Evaluation   Interventions  -- Encouraged to attend Cardiac Rehabilitation for the exercise      Comments Counselor follow up with Mr. RUhertoday with him reporting he has noticed improvement in his stamina and endurance and able to do more normal activities since coming back into this program.  He is sleeping better with the help of OTC natural sleep aid and reports that his panic symptoms has decreased as well with exercise and the new medications.  Counselor commended Mr. R for his progress and commitment to consistency in exercise.  Mr. Daivon Rayos  called to say he was sorry that he can't be in [place] today since he has a cold. He hopes to return on Monday.         Education: Education Goals: Education classes will be provided on a weekly basis, covering required topics. Participant will state understanding/return demonstration of topics presented.  Learning Barriers/Preferences:     Learning Barriers/Preferences - 06/27/16 1128      Learning Barriers/Preferences   Learning Barriers None   Learning Preferences Group Instruction;Individual Instruction;Pictoral;Skilled Demonstration;Verbal Instruction;Video;Written Material      Education Topics: Initial Evaluation Education: - Verbal, written and demonstration of respiratory meds, RPE/PD scales, oximetry and breathing techniques. Instruction on use of nebulizers and MDIs: cleaning and proper use, rinsing mouth with steroid doses and importance of monitoring MDI activations. Flowsheet Row Pulmonary Rehab from 07/17/2016 in [facility] Cardiac and Pulmonary Rehab  Date  07/17/16  Educator  [facility]  Instruction Review Code  2- meets goals/outcomes      General Nutrition Guidelines/Fats and Fiber: -Group instruction provided by verbal, written material, models and posters to present the general guidelines for heart healthy nutrition. Gives an explanation and review of dietary fats and fiber.   Controlling Sodium/Reading Food Labels: -Group verbal and written material supporting the discussion of sodium use in heart healthy nutrition. Review and explanation with models, verbal and written materials for utilization of the food label.   Exercise Physiology & Risk Factors: - Group verbal and written instruction with models to review the exercise physiology of the cardiovascular system and associated critical values. Details cardiovascular disease risk factors and the goals associated with each risk factor.   Aerobic Exercise & Resistance Training: - Gives group verbal and  written discussion on the health impact of inactivity. On the components of aerobic and resistive training programs and the benefits of this training and how to safely progress through these programs. Flowsheet Row Pulmonary Rehab from 04/21/2015 in [place]  Date  04/21/15  Educator  [place]  Instruction Review Code  2- meets goals/outcomes      Flexibility, Balance, General Exercise Guidelines: - Provides group verbal and written instruction on the benefits of flexibility and balance training programs. Provides general exercise guidelines with specific guidelines to those with heart or lung disease. Demonstration and skill practice provided. Flowsheet Row Pulmonary Rehab from 04/21/2015 in [place]  Date  04/21/15  Educator  [place]  Instruction Review Code  2- meets goals/outcomes      Stress Management: - Provides group verbal and written instruction about the health risks of elevated stress, cause of high stress, and healthy ways to reduce stress.   Depression: - Provides group verbal and written instruction on the correlation between heart/lung disease and depressed mood, treatment options, and the stigmas associated with seeking treatment.   Exercise & Equipment Safety: - Individual verbal instruction and demonstration of equipment use and safety with use of the equipment. Flowsheet Row Pulmonary Rehab from 07/17/2016 in [facility] Cardiac and Pulmonary Rehab  Date  07/17/16  Educator  [facility]  Instruction Review Code  2- meets goals/outcomes      Infection Prevention: - Provides verbal and written material to individual with discussion of infection control including proper hand washing and proper equipment cleaning during exercise session. Flowsheet Row Pulmonary Rehab from 07/17/2016 in [facility] Cardiac and Pulmonary Rehab  Date  07/17/16  Educator  [facility]  Instruction Review Code  2-  meets goals/outcomes       Falls Prevention: - Provides verbal and written material to individual with discussion of falls prevention and safety. Flowsheet Row Pulmonary Rehab from 07/17/2016 in [facility] Cardiac and Pulmonary Rehab  Date  06/27/16  Educator  [facility]  Instruction Review Code  2- meets goals/outcomes      Diabetes: - Individual verbal and written instruction to review signs/symptoms of diabetes, desired ranges of glucose level fasting, after meals and with exercise. Advice that pre and post exercise glucose checks will be done for 3 sessions at entry of program.   Chronic Lung Diseases: - Group verbal and written instruction to review new updates, new respiratory medications, new advancements in procedures and treatments. Provide informative websites and "800" numbers of self-education. Flowsheet Row Pulmonary Rehab from 04/26/2015 in [place]  Date  04/26/15  Educator  [facility]  Instruction Review Code  2- meets goals/outcomes      Lung Procedures: - Group verbal and written instruction to describe testing methods done to diagnose lung disease. Review the outcome of test results. Describe the treatment choices: Pulmonary Function Tests, ABGs and oximetry. Flowsheet Row Pulmonary Rehab from 04/30/2015 in [place]  Date  04/30/15  Educator  [place]  Instruction Review Code  2- meets goals/outcomes      Energy Conservation: - Provide group verbal and written instruction for methods to conserve energy, plan and organize activities. Instruct on pacing techniques, use of adaptive equipment and posture/positioning to relieve shortness of breath. Flowsheet Row Pulmonary Rehab from 05/24/2015 in [place]  Date  05/12/15  Educator  [facility]  Instruction Review Code  2- meets goals/outcomes      Triggers: - Group verbal and written instruction to review types of environmental controls: home humidity,  furnaces, filters, dust mite/pet prevention, HEPA vacuums. To discuss weather changes, air quality and the benefits of nasal washing.   Exacerbations: - Group verbal and written instruction to provide: warning signs, infection symptoms, calling MD promptly, preventive modes, and value of vaccinations. Review: effective airway clearance, coughing and/or vibration techniques. Create an Sports administrator. Flowsheet Row Pulmonary Rehab from 05/24/2015 in [place]  Date  05/24/15  Educator  [facility] [facility]  Instruction Review Code  2- meets goals/outcomes      Oxygen: - Individual and group verbal and written instruction on oxygen therapy. Includes supplement oxygen, available portable oxygen systems, continuous and intermittent flow rates, oxygen safety, concentrators, and Medicare reimbursement for oxygen. Flowsheet Row Pulmonary Rehab from 07/17/2016 in [facility] Cardiac and Pulmonary Rehab  Date  06/27/16  Educator  [facility]  Instruction Review Code  2- meets goals/outcomes      Respiratory Medications: - Group verbal and written instruction to review medications for lung disease. Drug class, frequency, complications, importance of spacers, rinsing mouth after steroid MDI's, and proper cleaning methods for nebulizers. Flowsheet Row Pulmonary Rehab from 07/17/2016 in [facility] Cardiac and Pulmonary Rehab  Date  06/27/16  Educator  LB  Instruction Review Code  2- meets goals/outcomes      AED/CPR: - Group verbal and written instruction with the use of models to demonstrate the basic use of the AED with the basic ABC's of resuscitation.   Breathing Retraining: - Provides individuals verbal and written instruction on purpose, frequency, and proper technique of diaphragmatic breathing and pursed-lipped breathing. Applies individual practice skills. Flowsheet Row Pulmonary Rehab from 07/17/2016 in [facility] Cardiac and Pulmonary Rehab  Date  07/17/16  Educator  [facility]  Instruction Review  Code  2- meets goals/outcomes      Anatomy and Physiology of the Lungs: - Group verbal and written instruction with the use of models to provide basic lung anatomy and physiology related to function, structure and complications of lung disease.   Heart Failure: - Group verbal and written instruction on the basics of heart failure: signs/symptoms, treatments, explanation of ejection fraction, enlarged heart and cardiomyopathy.   Sleep Apnea: - Individual verbal and written instruction to review Obstructive Sleep Apnea. Review of risk factors, methods for diagnosing and types of masks and machines for OSA.   Anxiety: - Provides group, verbal and written instruction on the correlation between heart/lung disease and anxiety, treatment options, and management of anxiety.   Relaxation: - Provides group, verbal and written instruction about the benefits of relaxation for patients with heart/lung disease. Also provides patients with examples of relaxation techniques. Flowsheet Row Pulmonary Rehab from 05/05/2015 in [place]  Date  05/05/15  Educator  Lucianne Lei LCSW  Instruction Review Code  2- Meets goals/outcomes      Knowledge Questionnaire Score:     Knowledge Questionnaire Score - 06/27/16 1129      Knowledge Questionnaire Score   Pre Score 8/10       Core Components/Risk Factors/Patient Goals at Admission:     Personal Goals and Risk Factors at Admission - 06/27/16 1136      Core Components/Risk Factors/Patient Goals on Admission    Weight Management Yes;Weight Loss   Intervention Weight Management: Develop a combined nutrition and exercise program designed to reach desired caloric intake, while maintaining appropriate intake of nutrient and fiber, sodium and fats, and appropriate energy expenditure required for the weight goal.;Weight Management: Provide education and appropriate resources to help participant work on and attain  dietary goals.   Admit Weight 204 lb 6.4 oz (92.7 kg)   Goal Weight: Short Term 200 lb (90.7 kg)   Goal Weight: Long Term 150 lb (68 kg)   Expected Outcomes Short Term: Continue to assess and modify interventions until short term weight is achieved   Sedentary Yes   Intervention Provide advice, education, support and counseling about physical activity/exercise needs.;Develop an individualized exercise prescription for aerobic and resistive training based on initial evaluation findings, risk stratification, comorbidities and participant's personal goals.  Home stationary exercise bike   Expected Outcomes Achievement of increased cardiorespiratory fitness and enhanced flexibility, muscular endurance and strength shown through measurements of functional capacity and personal statement of participant.   Increase Strength and Stamina Yes   Intervention Provide advice, education, support and counseling about physical activity/exercise needs.;Develop an individualized exercise prescription for aerobic and resistive training based on initial evaluation findings, risk stratification, comorbidities and participant's personal goals.   Expected Outcomes Achievement of increased cardiorespiratory fitness and enhanced flexibility, muscular endurance and strength shown through measurements of functional capacity and personal statement of participant.   Improve shortness of breath with ADL's Yes   Intervention Provide education, individualized exercise plan and daily activity instruction to help decrease symptoms of SOB with activities of daily living.   Expected Outcomes Short Term: Achieves a reduction of symptoms when performing activities of daily living.   Develop more efficient breathing techniques such as purse lipped breathing and diaphragmatic breathing; and practicing self-pacing with activity Yes   Intervention Provide education, demonstration and support about specific breathing techniuqes utilized for  more efficient breathing. Include techniques such as  pursed lipped breathing, diaphragmatic breathing and self-pacing activity.   Expected Outcomes Short Term: Participant will be able to demonstrate and use breathing techniques as needed throughout daily activities.   Increase knowledge of respiratory medications and ability to use respiratory devices properly  Yes   Intervention Provide education and demonstration as needed of appropriate use of medications, inhalers, and oxygen therapy.  Oxygen 2l/m [place]; MDI's: Combivent Respimat, Symbicort; Svn: Albuterol; Uses a spacer   Expected Outcomes Short Term: Achieves understanding of medications use. Understands that oxygen is a medication prescribed by physician. Demonstrates appropriate use of inhaler and oxygen therapy.   Hypertension Yes   Intervention Provide education on lifestyle modifcations including regular physical activity/exercise, weight management, moderate sodium restriction and increased consumption of fresh fruit, vegetables, and low fat dairy, alcohol moderation, and smoking cessation.;Monitor prescription use compliance.   Expected Outcomes Short Term: Continued assessment and intervention until BP is < 140/47m HG in hypertensive participants. < 130/816mHG in hypertensive participants with diabetes, heart failure or chronic kidney disease.;Long Term: Maintenance of blood pressure at goal levels.      Core Components/Risk Factors/Patient Goals Review:      Goals and Risk Factor Review    Row Name 07/17/16 1126 07/21/16 1215 08/02/16 1158 09/14/16 0957       Core Components/Risk Factors/Patient Goals Review   Personal Goals Review Develop more efficient breathing techniques such as purse lipped breathing and diaphragmatic breathing and practicing self-pacing with activity. Weight Management/Obesity;Hypertension Increase knowledge of respiratory medications and ability to use respiratory devices properly.;Improve shortness of  breath with ADL's;Sedentary;Develop more efficient breathing techniques such as purse lipped breathing and diaphragmatic breathing and practicing self-pacing with activity. Improve shortness of breath with ADL's;Increase Strength and Stamina;Increase knowledge of respiratory medications and ability to use respiratory devices properly.;Develop more efficient breathing techniques such as purse lipped breathing and diaphragmatic breathing and practicing self-pacing with activity.    Review Discussed pursed lip breathing techniques with patient. He stated that he is comfortable using pursed lip breathing and uses it in his daily activities to help control SOB.  Discussed the two risk factors with MiRonalee Beltsoday. He has goal od weight down to 200 lbs short term. At 204 lbs now, Was 210 not to long ago.  He stated that he still eats a variey of foods fried and grilled.  He has deferred an RD appointment. I di remind hiom that the RD is available if he does not see weight change as desired, so he can review his nutrition plan. He is walking at home as well as coming to the prgoram. He is new to the program and expects to see change as he comes to the sessions. Hypertension is controlled at present time with medications nd the exercise he is doing in the program and at home.  He stated he is aware to watch for the amount of sodium intake per day.  Mr RiStuhroticed a difference walking into [name] today - easier and less shortness of breath. He is walking around the house more and plans to start using his stationary bike at home. He has good technique with his PLB. Mr RiKrumholzas returned to [facility] weeks off due to an COPD excerbation. He had his SVN changed to Duoneb and was educated on the 2 medicine [place] has a good understanding of his inhalers and oxygen. PLB is a daily therapy for him with activity. He has improved his exercise goals and has more  stamina for activities at home and walking.     Expected Outcomes Patient will continue to use pursed lip breathing during his exercise sessions in order to be able to continue with progression of workloads to increase strengh and stamina.  Weight loss goal to 200 lbs met prior to discharge. RD inout if no changes noted midway through program.  COntinued compliance with medications , continued exercise for blood pressure control maintenance  --  --       Core Components/Risk Factors/Patient Goals at Discharge (Final Review):      Goals and Risk Factor Review - 09/14/16 0957      Core Components/Risk Factors/Patient Goals Review   Personal Goals Review Improve shortness of breath with ADL's;Increase Strength and Stamina;Increase knowledge of respiratory medications and ability to use respiratory devices properly.;Develop more efficient breathing techniques such as purse lipped breathing and diaphragmatic breathing and practicing self-pacing with activity.   Review Mr Exley has returned to [place] with 2 weeks off due to an COPD excerbation. He had his SVN changed to Duoneb and was educated on the 2 medicine in the [place]. He has a good understanding of his inhalers and oxygen. PLB is a daily therapy for him with activity. He has improved his exercise goals and has more stamina for activities at home and walking.      ITP Comments:     ITP Comments    Row Name 08/30/16 1031 09/06/16 1419 09/13/16 1301       ITP Comments Mr. Marland Reine called to say he was sorry that he can't be in [facility] today since he has a cold. He hopes to return on Monday.  [name] called to let us know that he had been out sick.  He was admitted to hospital for observation over night with increased shortness of breath.  He hopes to be able to return to rehab on Monday.  [name] is progressing well with exercise.        Comments: 30 day note review

## 2016-09-20 DIAGNOSIS — J449 Chronic obstructive pulmonary disease, unspecified: Secondary | ICD-10-CM

## 2016-09-20 NOTE — Progress Notes (Signed)
Daily Session Note  Patient Details  Name: Darius Norris MRN: [id number] Date of Birth: Feb 05, 1951 Referring Provider:   Arn Medal Row Documentation from 09/06/2016 in [facility] Cardiac and Pulmonary Rehab  Referring Provider  [facility]      Encounter Date: 09/20/2016  Check In:     Session Check In - 09/20/16 1052      Check-In   Location [facility]-Cardiac & Pulmonary Rehab   Staff Present Alberteen Sam, MA, ACSM RCEP, Exercise Physiologist;Amanda Oletta Darter, BA, ACSM CEP, Exercise Physiologist;Laureen Owens Shark, BS, RRT, Respiratory Therapist   Supervising physician immediately available to respond to emergencies [name] immediately available ER MD   Physician(s) Edd Fabian and Corky Downs   Medication changes reported     No   Fall or balance concerns reported    No   Warm-up and Cool-down Performed as group-led Location manager Performed Yes   VAD Patient? No     Pain Assessment   Currently in Pain? No/denies         Goals Met:  Proper associated with RPD/PD & O2 Sat [place] with exercise equipment Exercise tolerated well Strength training completed today  Goals Unmet:  Not Applicable  Comments:      [place] Name 06/27/16 1034 09/20/16 1053       6 Minute Walk   Phase  - Mid Program    Distance 500 feet 888 feet    Distance % Change  - 77 %    Walk Time 4 minutes 5.58 minutes    # of Rest Breaks  - 1    MPH 1.42 1.8    METS 1.6 2.48    RPE 15 20    Perceived Dyspnea  4 7    VO2 Peak 5.5 8.7    Symptoms Yes (comment) Yes (comment)    Comments low back pain more than breathing low back pain    Resting HR 69 bpm 102 bpm    Resting BP 138/74 138/68    Max Ex. HR 87 bpm 118 bpm    Max Ex. BP 152/72 146/64      Interval HR   Baseline HR  - 2    1 Minute HR  - 111    2 Minute HR  - 111    3 Minute HR  - 115    5 Minute HR  - 115    6 Minute HR  - 118    Interval Heart Rate?  - Yes      Interval Oxygen   Interval Oxygen?   - Yes    Baseline Oxygen Saturation %  - 96 %    Baseline Liters of Oxygen  - 2 L    1 Minute Oxygen Saturation %  - 91 %    1 Minute Liters of Oxygen  - 2 L    2 Minute Oxygen Saturation %  - 90 %    2 Minute Liters of Oxygen  - 2 L    3 Minute Oxygen Saturation %  - 90 %    3 Minute Liters of Oxygen  - 2 L    4 Minute Liters of Oxygen  - 2 L    5 Minute Oxygen Saturation %  - 91 %    5 Minute Liters of Oxygen  - 2 L    6 Minute Oxygen Saturation %  - 89 %    6 Minute Liters of Oxygen  - 2  L    2 Minute Post Liters of Oxygen  - 2 L         Dr. Emily Filbert is Medical Director for [place] and [name].

## 2016-09-20 NOTE — Progress Notes (Signed)
Pulmonary Individual Treatment Plan  Patient Details  Name: ROBT OKUDA MRN: [id number] Date of Birth: [birth year] Referring Provider:   [place] Documentation from 09/06/2016 in [facility] Cardiac and Pulmonary Rehab  Referring Provider  [facility]      Initial Encounter Date:  [place] Documentation from 09/06/2016 in [facility] Cardiac and Pulmonary Rehab  Date  07/17/16  Referring Provider  [facility]      Visit Diagnosis: Moderate COPD (chronic obstructive pulmonary disease) ([place])  Patient's Home Medications on Admission:  Current Outpatient Prescriptions:  .  acetaminophen (TYLENOL) 325 MG tablet, Take 975 mg by mouth 3 (three) times daily., Disp: , Rfl:  .  amitriptyline (ELAVIL) 10 MG tablet, Take 30 mg by mouth at bedtime., Disp: , Rfl:  .  atorvastatin (LIPITOR) 80 MG tablet, Take 40 mg by mouth at bedtime., Disp: , Rfl:  .  budesonide-formoterol (SYMBICORT) 160-4.5 MCG/ACT inhaler, Inhale 2 puffs into the lungs 2 (two) times daily., Disp: , Rfl:  .  busPIRone (BUSPAR) 10 MG tablet, Take 10 mg by mouth 2 (two) times daily., Disp: , Rfl:  .  clobetasol cream (TEMOVATE) 2.42 %, Apply 1 application topically daily as needed. For itching on psoriasis areas, Disp: , Rfl:  .  diclofenac sodium (VOLTAREN) 1 % GEL, Apply 2 g topically 2 (two) times daily., Disp: , Rfl:  .  finasteride (PROSCAR) 5 MG tablet, Take 5 mg by mouth daily., Disp: , Rfl:  .  fluticasone (FLONASE) 50 MCG/ACT nasal spray, Place 2 sprays into both nostrils daily., Disp: , Rfl:  .  furosemide (LASIX) 20 MG tablet, Take 1 tablet (20 mg total) by mouth daily., Disp: 30 tablet, Rfl: 0 .  ipratropium (ATROVENT) 0.02 % nebulizer solution, Take 2.5 mLs (0.5 mg total) by nebulization every 6 (six) hours as needed for wheezing or shortness of breath., Disp: 75 mL, Rfl: 12 .  Ipratropium-Albuterol (COMBIVENT RESPIMAT) 20-100 MCG/ACT AERS respimat, Inhale 1 puff into the lungs every 6 (six) hours as needed  for wheezing or shortness of breath., Disp: , Rfl:  .  isosorbide mononitrate (IMDUR) 60 MG 24 hr tablet, Take 60 mg by mouth daily., Disp: , Rfl:  .  levofloxacin (LEVAQUIN) 750 MG tablet, Take 1 tablet (750 mg total) by mouth daily at 6 PM., Disp: 5 tablet, Rfl: 0 .  levofloxacin (LEVAQUIN) 750 MG tablet, Take 1 tablet (750 mg total) by mouth daily., Disp: 4 tablet, Rfl: 0 .  lisinopril (PRINIVIL,ZESTRIL) 20 MG tablet, Take 10 mg by mouth daily., Disp: , Rfl:  .  loratadine (CLARITIN) 10 MG tablet, Take 10 mg by mouth daily., Disp: , Rfl:  .  metoprolol (LOPRESSOR) 50 MG tablet, Take 100 mg by mouth 2 (two) times daily., Disp: , Rfl:  .  omeprazole (PRILOSEC) 20 MG capsule, Take 40 mg by mouth daily., Disp: , Rfl:  .  predniSONE (STERAPRED UNI-PAK 21 TAB) 10 MG (21) TBPK tablet, Take 1 tablet (10 mg total) by mouth daily. Taper as prescribed, Disp: 21 tablet, Rfl: 0 .  salicyclic acid-sulfur (SEBULEX) 2-2 % shampoo, Apply 1 application topically daily., Disp: , Rfl:  .  senna-docusate (SENOKOT-S) 8.6-50 MG tablet, Take 2 tablets by mouth 2 (two) times daily., Disp: , Rfl:  .  simethicone (MYLICON) 80 MG chewable tablet, Chew 80 mg by mouth 3 (three) times daily as needed for flatulence., Disp: , Rfl:   Past Medical History: Past Medical History:  Diagnosis Date  . CAD (coronary artery  disease)   . Colitis   . COPD (chronic obstructive pulmonary disease) ([employer])   . Emphysema lung ([place])   . GERD (gastroesophageal reflux disease)   . Hypertension     Tobacco Use: History  Smoking Status  . Former Smoker  Smokeless Tobacco  . Never Used    Labs: Recent [employer] for ITP Cardiac and Pulmonary Rehab Latest Ref Rng & Units 02/04/2013 09/04/2016   Cholestrol 0 - 200 mg/dL 185 116   LDLCALC 0 - 99 mg/dL 114(H) 51   HDL >40 mg/dL 38(L) 42   Trlycerides <150 mg/dL 166 116   Hemoglobin A1c 4.8 - 5.6 % - 6.4(H)       ADL UCSD:     Pulmonary Assessment Scores     Row Name 06/27/16 1131         ADL UCSD   ADL Phase Entry     SOB Score total 50     Rest 1     Walk 3     Stairs 2     Bath 4     Dress 2     Shop 2        Pulmonary Function Assessment:     Pulmonary Function Assessment - 06/27/16 1129      Initial Spirometry Results   FVC% 40 %   FEV1% 34 %   FEV1/FVC Ratio 65.59     Post Bronchodilator Spirometry Results   FVC% 49 %   FEV1% 24 %   FEV1/FVC Ratio 56.11     Breath   Shortness of Breath Yes;Fear of Shortness of Breath;Limiting activity      Exercise Target Goals:    Exercise Program Goal: Individual exercise prescription set with THRR, safety & activity barriers. Participant demonstrates ability to understand and report RPE using BORG scale, to self-measure pulse accurately, and to acknowledge the importance of the exercise prescription.  Exercise Prescription Goal: Starting with aerobic activity 30 plus minutes a day, 3 days per week for initial exercise prescription. Provide home exercise prescription and guidelines that participant acknowledges understanding prior to discharge.  Activity Barriers & Risk Stratification:     Activity Barriers & Cardiac Risk Stratification - 06/27/16 1128      Activity Barriers & Cardiac Risk Stratification   Activity Barriers Shortness of Breath;Deconditioning   Cardiac Risk Stratification Moderate      6 Minute Walk:     6 Minute Walk    Row Name 06/27/16 1034 09/20/16 1053       6 Minute Walk   Phase  - Mid Program    Distance 500 feet 888 feet    Distance % Change  - 77 %    Walk Time 4 minutes 5.58 minutes    # of Rest Breaks  - 1    MPH 1.42 1.8    METS 1.6 2.48    RPE 15 20    Perceived Dyspnea  4 7    VO2 Peak 5.5 8.7    Symptoms Yes (comment) Yes (comment)    Comments low back pain more than breathing low back pain    Resting HR 69 bpm 102 bpm    Resting BP 138/74 138/68    Max Ex. HR 87 bpm 118 bpm    Max Ex. BP 152/72 146/64      Interval HR    Baseline HR  - 2    1 Minute HR  - 111  2 Minute HR  - 111    3 Minute HR  - 115    5 Minute HR  - 115    6 Minute HR  - 118    Interval Heart Rate?  - Yes      Interval Oxygen   Interval Oxygen?  - Yes    Baseline Oxygen Saturation %  - 96 %    Baseline Liters of Oxygen  - 2 L    1 Minute Oxygen Saturation %  - 91 %    1 Minute Liters of Oxygen  - 2 L    2 Minute Oxygen Saturation %  - 90 %    2 Minute Liters of Oxygen  - 2 L    3 Minute Oxygen Saturation %  - 90 %    3 Minute Liters of Oxygen  - 2 L    4 Minute Liters of Oxygen  - 2 L    5 Minute Oxygen Saturation %  - 91 %    5 Minute Liters of Oxygen  - 2 L    6 Minute Oxygen Saturation %  - 89 %    6 Minute Liters of Oxygen  - 2 L    2 Minute Post Liters of Oxygen  - 2 L       Initial Exercise Prescription:     Initial Exercise Prescription - 09/06/16 1400      Date of Initial Exercise RX and Referring Provider   Date 07/17/16   Referring Provider [facility]      Perform Capillary Blood Glucose checks as needed.  Exercise Prescription Changes:     Exercise Prescription Changes    Row Name 07/17/16 1100 07/19/16 1400 08/01/16 1500 08/02/16 1400 08/16/16 1500     Exercise Review   Progression  - Yes Yes Yes Yes     Response to Exercise   Blood Pressure (Admit) 140/78 120/70 120/66  - 138/68   Blood Pressure (Exercise) 132/70 164/76 164/80  - 152/70   Blood Pressure (Exit) 138/70 130/64 128/68  - 122/64   Heart Rate (Admit) 78 bpm 73 bpm 88 bpm  - 82 bpm   Heart Rate (Exercise) 101 bpm 84 bpm 87 bpm  - 77 bpm   Heart Rate (Exit) 87 bpm 74 bpm 72 bpm  - 69 bpm   Oxygen Saturation (Admit) 96 % 92 % 98 %  - 96 %   Oxygen Saturation (Exercise) 92 % 91 % 95 %  - 94 %   Oxygen Saturation (Exit) 95 % 95 % 96 %  - 96 %   Rating of Perceived Exertion (Exercise) '14 13 13  '$ - 12   Perceived Dyspnea (Exercise) '4 3 4  '$ - 33   Symptoms  -  -  - Home Exercise Guidelines given 08/02/16 Home Exercise Guidelines  given 08/02/16   Comments  -  - none none none   Duration Progress to 30 minutes of continuous aerobic without signs/symptoms of physical distress Progress to 45 minutes of aerobic exercise without signs/symptoms of physical distress Progress to 45 minutes of aerobic exercise without signs/symptoms of physical distress Progress to 45 minutes of aerobic exercise without signs/symptoms of physical distress Progress to 45 minutes of aerobic exercise without signs/symptoms of physical distress   Intensity THRR unchanged  103-139 THRR unchanged THRR unchanged THRR unchanged THRR unchanged     Progression   Progression Continue progressive overload as per policy without signs/symptoms or physical distress.  Continue to progress workloads to maintain intensity without signs/symptoms of physical distress. Continue to progress workloads to maintain intensity without signs/symptoms of physical distress. Continue to progress workloads to maintain intensity without signs/symptoms of physical distress. Continue to progress workloads to maintain intensity without signs/symptoms of physical distress.   Average METs 1.9 2 2.03 2.03 2.23     Resistance Training   Training Prescription Yes Yes Yes Yes Yes   Weight '3 3 4 '$ lbs 4 lbs 4 lbs   Reps 10-15 10-15 10-15 10-15 10-15     Interval Training   Interval Training  - No No No No     Oxygen   Oxygen Continuous Continuous Continuous Continuous Continuous   Liters '2 2 2 2 2     '$ Recumbant Bike   Level 1 -  omitted due to time today 1.5 1.5 1.5   RPM 30  - 45 45 42   Minutes 6  3-3 intervals  - '13  8 5 13  8 5 11   '$ METs 1.5  - 2.3 2.3 2.3     REL-XR   Level '2 2 3 3 3   '$ Minutes '9 15 15 15 15   '$ METs 1.9 2 1.8 1.8 2.4     Biostep-RELP   Level '1 1 3 3 3   '$ Minutes '10 15 15 15 15   '$ METs '1 2 2 2 2     '$ Home Exercise Plan   Plans to continue exercise at  -  -  - Home  walking and stationary bike Home  walking and stationary bike   Frequency  -  -  - Add 1  additional day to program exercise sessions. Add 1 additional day to program exercise sessions.   [place] Name 08/29/16 1400 09/13/16 1200           Exercise Review   Progression Yes Yes        Response to Exercise   Blood Pressure (Admit) 132/78 130/60      Blood Pressure (Exercise) 140/68 140/70      Blood Pressure (Exit) 136/62 116/64      Heart Rate (Admit) 75 bpm 83 bpm      Heart Rate (Exercise) 78 bpm 81 bpm      Heart Rate (Exit) 69 bpm 70 bpm      Oxygen Saturation (Admit) 89 % 91 %      Oxygen Saturation (Exercise) 97 % 96 %      Oxygen Saturation (Exit) 95 % 98 %      Rating of Perceived Exertion (Exercise) 12 14      Perceived Dyspnea (Exercise) 3 3      Symptoms Home Exercise Guidelines given 08/02/16  -      Comments none none      Duration Progress to 45 minutes of aerobic exercise without signs/symptoms of physical distress Progress to 45 minutes of aerobic exercise without signs/symptoms of physical distress      Intensity THRR unchanged THRR unchanged        Progression   Progression Continue to progress workloads to maintain intensity without signs/symptoms of physical distress. Continue to progress workloads to maintain intensity without signs/symptoms of physical distress.      Average METs 2.07 2.1        Resistance Training   Training Prescription Yes Yes      Weight 4 lbs 4      Reps 10-15 10-15  Interval Training   Interval Training No Yes        Oxygen   Oxygen Continuous Continuous      Liters 2 2        Recumbant Bike   Level 1.5 2      RPM 53  -      Minutes 15 15      METs 2.3 2.5        REL-XR   Level 3 3      Minutes 15 15      METs 1.9 1.7        Biostep-RELP   Level 4 4      Minutes 15 15      METs 2  -        Home Exercise Plan   Plans to continue exercise at Home  walking and stationary bike  -      Frequency Add 2 additional days to program exercise sessions.  -         Exercise Comments:     Exercise Comments     Row Name 07/17/16 1136 07/19/16 1442 08/01/16 1535 08/02/16 1118 08/16/16 1509   Exercise Comments Today was the patient's first day of class. The patient's initial exercise prescription (based on the 6 min walk evaluation) was reviewed with the patient. [name] is off to a good start with exercise.  He had now done two sessions.  He already made some improvement.  We will continue to monitor for improvement. [name] continues to do well with exercise. He is now up to 8 minutes on the recumbent bike continuosly.  We wil continue to monitor for progression. Reviewed home exercise with pt today.  Pt plans to walk and use stationary bike at home for exercise.  Reviewed THR, pulse, RPE, sign and symptoms, and when to call 911 or MD.  Also discussed weather considerations and indoor options.  Pt voiced understanding. [name] is doing well with his exercise.  We are still working on building up time on the recumbent bike.  We will continue to monitor for progression.   [place] Name 08/29/16 1423 09/13/16 1300         Exercise Comments [name] continues to do well with exercise.  He is now doing level 4 on the BioStep!  We will continue to monitor for progression. [name] is proressing well with exercise.         Discharge Exercise Prescription (Final Exercise Prescription Changes):     Exercise Prescription Changes - 09/13/16 1200      Exercise Review   Progression Yes     Response to Exercise   Blood Pressure (Admit) 130/60   Blood Pressure (Exercise) 140/70   Blood Pressure (Exit) 116/64   Heart Rate (Admit) 83 bpm   Heart Rate (Exercise) 81 bpm   Heart Rate (Exit) 70 bpm   Oxygen Saturation (Admit) 91 %   Oxygen Saturation (Exercise) 96 %   Oxygen Saturation (Exit) 98 %   Rating of Perceived Exertion (Exercise) 14   Perceived Dyspnea (Exercise) 3   Comments none   Duration Progress to 45 minutes of aerobic exercise without signs/symptoms of physical distress   Intensity THRR unchanged     Progression    Progression Continue to progress workloads to maintain intensity without signs/symptoms of physical distress.   Average METs 2.1     Resistance Training   Training Prescription Yes   Weight 4   Reps 10-15     Interval  Training   Interval Training Yes     Oxygen   Oxygen Continuous   Liters 2     Recumbant Bike   Level 2   Minutes 15   METs 2.5     REL-XR   Level 3   Minutes 15   METs 1.7     Biostep-RELP   Level 4   Minutes 15       Nutrition:  Target Goals: Understanding of nutrition guidelines, daily intake of sodium '1500mg'$ , cholesterol '200mg'$ , calories 30% from fat and 7% or less from saturated fats, daily to have 5 or more servings of fruits and vegetables.  Biometrics:     Pre Biometrics - 06/27/16 1034      Pre Biometrics   Height '5\' 6"'$  (1.676 m)   Weight 204 lb 6.4 oz (92.7 kg)   Waist Circumference 49 inches   Hip Circumference 48 inches   Waist to Hip Ratio 1.02 %   BMI (Calculated) 33.1       Nutrition Therapy Plan and Nutrition Goals:   Nutrition Discharge: Rate Your Plate Scores:   Psychosocial: Target Goals: Acknowledge presence or absence of depression, maximize coping skills, provide positive support system. Participant is able to verbalize types and ability to use techniques and skills needed for reducing stress and depression.  Initial Review & Psychosocial Screening:     Initial Psych Review & Screening - 06/27/16 [place]? Yes   Comments Mr Hustead has good support from his wife. He admits to panic attacks with shortness of breath and has recently been set-up with a therapist for counciling. He states that this treatment will help along with [name] exercise with suppervision.     Barriers   Psychosocial barriers to participate in program The patient should benefit from training in stress management and relaxation.     Screening Interventions   Interventions Encouraged to  exercise;Program counselor consult      Quality of Life Scores:     Quality of Life - 06/27/16 1145      Quality of Life Scores   Health/Function Pre 19.5 %   Socioeconomic Pre 19.17 %   Psych/Spiritual Pre 19 %   Family Pre 20.6 %   GLOBAL Pre 19.5 %      PHQ-9: Recent Review Flowsheet Data    Depression screen [facility] 2/9 06/27/2016   Decreased Interest 0   Down, Depressed, Hopeless 0   PHQ - 2 Score 0   Altered sleeping 1   Tired, decreased energy 2   Change in appetite 1   Feeling bad or failure about yourself  0   Trouble concentrating 0   Moving slowly or fidgety/restless 0   Suicidal thoughts 0   PHQ-9 Score 4   Difficult doing work/chores Somewhat difficult      Psychosocial Evaluation and Intervention:     Psychosocial Evaluation - 07/17/16 1058      Psychosocial Evaluation & Interventions   Comments Counselor met with Mr. Cybulski today for initial psychosocial evaluation.   He was in this program approximately 1 year ago and reports having a "bad episode" about a month ago which resulted in his Dr. recommending a return to Pulmonary Rehab.  Mr. Hicklin has COPD and is otherwise in fairly good health.  He has a spouse of 50 years, an adult daughter and several sisters who live close by and are his strong support system.  Mr. Papesh  states he sleeps okay with the help of OTC Melatonin at night as needed.  His appetite is good as well.  Mr. Massi reports a recent history of panic attacks resulting in his psychiatrist adding a medication for this which he states is Buspiron @ '10mg'$ .  This counselor did not see this medication on Mr. Rech's list and asked him to bring it to next class for a nurse to add it to the medication list.  He states this has been helpful in addressing these symptoms.  Mr. Enriquez reports his mood is "okay" and his health is his primary stress at this time.  Mr. Kuenzel has goals to lose weight; breathe better; and increase his stamina while in  this program.  Counselor encouraged Mr. Cicalese to begin considering how he will maintain exercising consistently upon completion of this program and he agreed to work on this as a goal as well.        Psychosocial Re-Evaluation:     Psychosocial Re-Evaluation    [place] Name 07/31/16 1136 08/30/16 1031           Psychosocial Re-Evaluation   Interventions  - Encouraged to attend Cardiac Rehabilitation for the exercise      Comments Counselor follow up with Mr. Nishi today with him reporting he has noticed improvement in his stamina and endurance and able to do more normal activities since coming back into this program.  He is sleeping better with the help of OTC natural sleep aid and reports that his panic symptoms has decreased as well with exercise and the new medications.  Counselor commended Mr. R for his progress and commitment to consistency in exercise.   Mr. Nico Syme called to say he was sorry that he can't be in [place] today since he has a cold. He hopes to return on Monday.         Education: Education Goals: Education classes will be provided on a weekly basis, covering required topics. Participant will state understanding/return demonstration of topics presented.  Learning Barriers/Preferences:     Learning Barriers/Preferences - 06/27/16 1128      Learning Barriers/Preferences   Learning Barriers None   Learning Preferences Group Instruction;Individual Instruction;Pictoral;Skilled Demonstration;Verbal Instruction;Video;Written Material      Education Topics: Initial Evaluation Education: - Verbal, written and demonstration of respiratory meds, RPE/PD scales, oximetry and breathing techniques. Instruction on use of nebulizers and MDIs: cleaning and proper use, rinsing mouth with steroid doses and importance of monitoring MDI activations. Flowsheet Row Pulmonary Rehab from 07/17/2016 in [facility] Cardiac and Pulmonary Rehab  Date  07/17/16  Educator  [facility]   Instruction Review Code  2- meets goals/outcomes      General Nutrition Guidelines/Fats and Fiber: -Group instruction provided by verbal, written material, models and posters to present the general guidelines for heart healthy nutrition. Gives an explanation and review of dietary fats and fiber.   Controlling Sodium/Reading Food Labels: -Group verbal and written material supporting the discussion of sodium use in heart healthy nutrition. Review and explanation with models, verbal and written materials for utilization of the food label.   Exercise Physiology & Risk Factors: - Group verbal and written instruction with models to review the exercise physiology of the cardiovascular system and associated critical values. Details cardiovascular disease risk factors and the goals associated with each risk factor.   Aerobic Exercise & Resistance Training: - Gives group verbal and written discussion on the health impact of inactivity. On the components of aerobic and  resistive training programs and the benefits of this training and how to safely progress through these programs. Flowsheet Row Pulmonary Rehab from 04/21/2015 in [place]  Date  04/21/15  Educator  [place]  Instruction Review Code  2- meets goals/outcomes      Flexibility, Balance, General Exercise Guidelines: - Provides group verbal and written instruction on the benefits of flexibility and balance training programs. Provides general exercise guidelines with specific guidelines to those with heart or lung disease. Demonstration and skill practice provided. Flowsheet Row Pulmonary Rehab from 04/21/2015 in [place]  Date  04/21/15  Educator  [place]  Instruction Review Code  2- meets goals/outcomes      Stress Management: - Provides group verbal and written instruction about the health risks of elevated stress, cause of high stress, and healthy  ways to reduce stress.   Depression: - Provides group verbal and written instruction on the correlation between heart/lung disease and depressed mood, treatment options, and the stigmas associated with seeking treatment.   Exercise & Equipment Safety: - Individual verbal instruction and demonstration of equipment use and safety with use of the equipment. Flowsheet Row Pulmonary Rehab from 07/17/2016 in [facility] Cardiac and Pulmonary Rehab  Date  07/17/16  Educator  [facility]  Instruction Review Code  2- meets goals/outcomes      Infection Prevention: - Provides verbal and written material to individual with discussion of infection control including proper hand washing and proper equipment cleaning during exercise session. Flowsheet Row Pulmonary Rehab from 07/17/2016 in [facility] Cardiac and Pulmonary Rehab  Date  07/17/16  Educator  [facility]  Instruction Review Code  2- meets goals/outcomes      Falls Prevention: - Provides verbal and written material to individual with discussion of falls prevention and safety. Flowsheet Row Pulmonary Rehab from 07/17/2016 in [facility] Cardiac and Pulmonary Rehab  Date  06/27/16  Educator  [facility]  Instruction Review Code  2- meets goals/outcomes      Diabetes: - Individual verbal and written instruction to review signs/symptoms of diabetes, desired ranges of glucose level fasting, after meals and with exercise. Advice that pre and post exercise glucose checks will be done for 3 sessions at entry of program.   Chronic Lung Diseases: - Group verbal and written instruction to review new updates, new respiratory medications, new advancements in procedures and treatments. Provide informative websites and "800" numbers of self-education. Flowsheet Row Pulmonary Rehab from 04/26/2015 in [place]  Date  04/26/15  Educator  [facility]  Instruction Review Code  2- meets goals/outcomes      Lung Procedures: - Group verbal and written instruction to  describe testing methods done to diagnose lung disease. Review the outcome of test results. Describe the treatment choices: Pulmonary Function Tests, ABGs and oximetry. Flowsheet Row Pulmonary Rehab from 04/30/2015 in [place]  Date  04/30/15  Educator  [place]  Instruction Review Code  2- meets goals/outcomes      Energy Conservation: - Provide group verbal and written instruction for methods to conserve energy, plan and organize activities. Instruct on pacing techniques, use of adaptive equipment and posture/positioning to relieve shortness of breath. Flowsheet Row Pulmonary Rehab from 05/24/2015 in [place]  Date  05/12/15  Educator  [facility]  Instruction Review Code  2- meets goals/outcomes      Triggers: - Group verbal and written instruction to review types of environmental  controls: home humidity, furnaces, filters, dust mite/pet prevention, HEPA vacuums. To discuss weather changes, air quality and the benefits of nasal washing.   Exacerbations: - Group verbal and written instruction to provide: warning signs, infection symptoms, calling MD promptly, preventive modes, and value of vaccinations. Review: effective airway clearance, coughing and/or vibration techniques. Create an Sports administrator. Flowsheet Row Pulmonary Rehab from 05/24/2015 in [place]  Date  05/24/15  Educator  L.Owens Shark  Instruction Review Code  2- meets goals/outcomes      Oxygen: - Individual and group verbal and written instruction on oxygen therapy. Includes supplement oxygen, available portable oxygen systems, continuous and intermittent flow rates, oxygen safety, concentrators, and Medicare reimbursement for oxygen. Flowsheet Row Pulmonary Rehab from 07/17/2016 in [facility] Cardiac and Pulmonary Rehab  Date  06/27/16  Educator  LB  Instruction Review Code  2- meets goals/outcomes      Respiratory  Medications: - Group verbal and written instruction to review medications for lung disease. Drug class, frequency, complications, importance of spacers, rinsing mouth after steroid MDI's, and proper cleaning methods for nebulizers. Flowsheet Row Pulmonary Rehab from 07/17/2016 in [facility] Cardiac and Pulmonary Rehab  Date  06/27/16  Educator  LB  Instruction Review Code  2- meets goals/outcomes      AED/CPR: - Group verbal and written instruction with the use of models to demonstrate the basic use of the AED with the basic ABC's of resuscitation.   Breathing Retraining: - Provides individuals verbal and written instruction on purpose, frequency, and proper technique of diaphragmatic breathing and pursed-lipped breathing. Applies individual practice skills. Flowsheet Row Pulmonary Rehab from 07/17/2016 in [facility] Cardiac and Pulmonary Rehab  Date  07/17/16  Educator  [facility]  Instruction Review Code  2- meets goals/outcomes      Anatomy and Physiology of the Lungs: - Group verbal and written instruction with the use of models to provide basic lung anatomy and physiology related to function, structure and complications of lung disease.   Heart Failure: - Group verbal and written instruction on the basics of heart failure: signs/symptoms, treatments, explanation of ejection fraction, enlarged heart and cardiomyopathy.   Sleep Apnea: - Individual verbal and written instruction to review Obstructive Sleep Apnea. Review of risk factors, methods for diagnosing and types of masks and machines for OSA.   Anxiety: - Provides group, verbal and written instruction on the correlation between heart/lung disease and anxiety, treatment options, and management of anxiety.   Relaxation: - Provides group, verbal and written instruction about the benefits of relaxation for patients with heart/lung disease. Also provides patients with examples of relaxation techniques. Flowsheet Row Pulmonary Rehab from  05/05/2015 in [place]  Date  05/05/15  Educator  Lucianne Lei LCSW  Instruction Review Code  2- Meets goals/outcomes      Knowledge Questionnaire Score:     Knowledge Questionnaire Score - 06/27/16 1129      Knowledge Questionnaire Score   Pre Score 8/10       Core Components/Risk Factors/Patient Goals at Admission:     Personal Goals and Risk Factors at Admission - 06/27/16 1136      Core Components/Risk Factors/Patient Goals on Admission    Weight Management Yes;Weight Loss   Intervention Weight Management: Develop a combined nutrition and exercise program designed to reach desired caloric intake, while maintaining appropriate intake of nutrient and fiber, sodium and fats, and appropriate energy expenditure required for the weight goal.;Weight Management: Provide education and appropriate  resources to help participant work on and attain dietary goals.   Admit Weight 204 lb 6.4 oz (92.7 kg)   Goal Weight: Short Term 200 lb (90.7 kg)   Goal Weight: Long Term 150 lb (68 kg)   Expected Outcomes Short Term: Continue to assess and modify interventions until short term weight is achieved   Sedentary Yes   Intervention Provide advice, education, support and counseling about physical activity/exercise needs.;Develop an individualized exercise prescription for aerobic and resistive training based on initial evaluation findings, risk stratification, comorbidities and participant's personal goals.  Home stationary exercise bike   Expected Outcomes Achievement of increased cardiorespiratory fitness and enhanced flexibility, muscular endurance and strength shown through measurements of functional capacity and personal statement of participant.   Increase Strength and Stamina Yes   Intervention Provide advice, education, support and counseling about physical activity/exercise needs.;Develop an individualized exercise prescription for aerobic and resistive  training based on initial evaluation findings, risk stratification, comorbidities and participant's personal goals.   Expected Outcomes Achievement of increased cardiorespiratory fitness and enhanced flexibility, muscular endurance and strength shown through measurements of functional capacity and personal statement of participant.   Improve shortness of breath with ADL's Yes   Intervention Provide education, individualized exercise plan and daily activity instruction to help decrease symptoms of SOB with activities of daily living.   Expected Outcomes Short Term: Achieves a reduction of symptoms when performing activities of daily living.   Develop more efficient breathing techniques such as purse lipped breathing and diaphragmatic breathing; and practicing self-pacing with activity Yes   Intervention Provide education, demonstration and support about specific breathing techniuqes utilized for more efficient breathing. Include techniques such as pursed lipped breathing, diaphragmatic breathing and self-pacing activity.   Expected Outcomes Short Term: Participant will be able to demonstrate and use breathing techniques as needed throughout daily activities.   Increase knowledge of respiratory medications and ability to use respiratory devices properly  Yes   Intervention Provide education and demonstration as needed of appropriate use of medications, inhalers, and oxygen therapy.  Oxygen 2l/m [place]; MDI's: Combivent Respimat, Symbicort; Svn: Albuterol; Uses a spacer   Expected Outcomes Short Term: Achieves understanding of medications use. Understands that oxygen is a medication prescribed by physician. Demonstrates appropriate use of inhaler and oxygen therapy.   Hypertension Yes   Intervention Provide education on lifestyle modifcations including regular physical activity/exercise, weight management, moderate sodium restriction and increased consumption of fresh fruit, vegetables, and low fat dairy,  alcohol moderation, and smoking cessation.;Monitor prescription use compliance.   Expected Outcomes Short Term: Continued assessment and intervention until BP is < 140/42m HG in hypertensive participants. < 130/889mHG in hypertensive participants with diabetes, heart failure or chronic kidney disease.;Long Term: Maintenance of blood pressure at goal levels.      Core Components/Risk Factors/Patient Goals Review:      Goals and Risk Factor Review    Row Name 07/17/16 1126 07/21/16 1215 08/02/16 1158 09/14/16 0957 09/18/16 1501     Core Components/Risk Factors/Patient Goals Review   Personal Goals Review Develop more efficient breathing techniques such as purse lipped breathing and diaphragmatic breathing and practicing self-pacing with activity. Weight Management/Obesity;Hypertension Increase knowledge of respiratory medications and ability to use respiratory devices properly.;Improve shortness of breath with ADL's;Sedentary;Develop more efficient breathing techniques such as purse lipped breathing and diaphragmatic breathing and practicing self-pacing with activity. Improve shortness of breath with ADL's;Increase Strength and Stamina;Increase knowledge of respiratory medications and ability to use respiratory devices properly.;Develop more efficient breathing  techniques such as purse lipped breathing and diaphragmatic breathing and practicing self-pacing with activity. Increase knowledge of respiratory medications and ability to use respiratory devices properly.   Review Discussed pursed lip breathing techniques with patient. He stated that he is comfortable using pursed lip breathing and uses it in his daily activities to help control SOB.  Discussed the two risk factors with Kathlene November today. He has goal od weight down to 200 lbs short term. At 204 lbs now, Was 210 not to long ago.  He stated that he still eats a variey of foods fried and grilled.  He has deferred an RD appointment. I di remind hiom that  the RD is available if he does not see weight change as desired, so he can review his nutrition plan. He is walking at home as well as coming to the prgoram. He is new to the program and expects to see change as he comes to the sessions. Hypertension is controlled at present time with medications nd the exercise he is doing in the program and at home.  He stated he is aware to watch for the amount of sodium intake per day.  Mr Takahashi noticed a difference walking into [name] today - easier and less shortness of breath. He is walking around the house more and plans to start using his stationary bike at home. He has good technique with his PLB. Mr Beitler has returned to [name] with 2 weeks off due to an COPD excerbation. He had his SVN changed to Duoneb and was educated on the 2 medicine in the [place]one[place]. He has a good understanding of his inhalers and oxygen. PLB is a daily therapy for him with activity. He has improved his exercise goals and has more stamina for activities at home and walking. Mr Morini is complaining of increased sputum production after his exacerbation which was treated with 2 antibiotics and 2 rounds of predisone. Educated him on increasing his water intake to 8 classes a day to liquidfy his sputum and showed him a flutter valve. He has an appointment with his pulmonologist this Thursday at the [place] and will ask about it.    Expected Outcomes Patient will continue to use pursed lip breathing during his exercise sessions in order to be able to continue with progression of workloads to increase strengh and stamina.  Weight loss goal to 200 lbs met prior to discharge. RD inout if no changes noted midway through program.  COntinued compliance with medications , continued exercise for blood pressure control maintenance  -  -  -      Core Components/Risk Factors/Patient Goals at Discharge (Final Review):      Goals and Risk Factor Review - 09/18/16 1501      Core Components/Risk  Factors/Patient Goals Review   Personal Goals Review Increase knowledge of respiratory medications and ability to use respiratory devices properly.   Review Mr Gumz is complaining of increased sputum production after his exacerbation which was treated with 2 antibiotics and 2 rounds of predisone. Educated him on increasing his water intake to 8 classes a day to liquidfy his sputum and showed him a flutter valve. He has an appointment with his pulmonologist this Thursday at the [place] and will ask about it.       ITP Comments:     ITP Comments    Row Name 08/30/16 1031 09/06/16 1419 09/13/16 1301       ITP Comments Mr. Erek Kowal called to say he was sorry  that he can't be in [place] [facility] today since he has a cold. He hopes to return on Monday.  [name] called to let us know that he had been out sick.  He was admitted to hospital for observation over night with increased shortness of breath.  He hopes to be able to return to rehab on Monday.  [name] is progressing well with exercise.        Comments: Mid 6 min walk done today

## 2016-09-27 ENCOUNTER — Encounter: Payer: Non-veteran care | Admitting: *Deleted

## 2016-09-27 DIAGNOSIS — J449 Chronic obstructive pulmonary disease, unspecified: Secondary | ICD-10-CM | POA: Diagnosis not present

## 2016-09-27 NOTE — Progress Notes (Signed)
Daily Session Note  Patient Details  Name: Darius Norris MRN: [id number] Date of Birth: [birth year] Referring Provider:   Arn Medal Row Documentation from 09/06/2016 in [facility] Cardiac and Pulmonary Rehab  Referring Provider  [facility]      Encounter Date: 09/27/2016  Check In:     Session Check In - 09/27/16 1112      Check-In   Location [facility]-Cardiac & Pulmonary Rehab   Staff Present Alberteen Sam, MA, ACSM RCEP, Exercise Physiologist;Laureen Owens Shark, BS, RRT, Respiratory Therapist;Carroll Enterkin, RN, BSN   Supervising physician immediately available to respond to emergencies [name] immediately available ER MD   Physician(s) Drs. Lord and [employer]   Medication changes reported     No   Fall or balance concerns reported    No   Warm-up and Cool-down Performed as group-led Location manager Performed Yes   VAD Patient? No     Pain Assessment   Currently in Pain? No/denies   Multiple Pain Sites No         Goals Met:  Proper associated with RPD/PD & O2 Sat Independence with exercise equipment Using PLB without cueing & demonstrates good technique Strength training completed today  Goals Unmet:  Not Applicable  Comments: Pt able to follow exercise prescription today without complaint.  Will continue to monitor for progression.    Dr. Emily Filbert is Medical Director for [place] and [name].

## 2016-09-29 DIAGNOSIS — J449 Chronic obstructive pulmonary disease, unspecified: Secondary | ICD-10-CM | POA: Diagnosis not present

## 2016-09-29 NOTE — Progress Notes (Signed)
Daily Session Note  Patient Details  Name: Darius Norris MRN: [id number] Date of Birth: 30-Jul-1951 Referring Provider:   Arn Medal Row Documentation from 09/06/2016 in [facility] Cardiac and Pulmonary Rehab  Referring Provider  [facility]      Encounter Date: 09/29/2016  Check In:     Session Check In - 09/29/16 1219      Check-In   Location [facility]-Cardiac & Pulmonary Rehab   Staff Present Heath Lark, RN, BSN, CCRP;Carroll Enterkin, RN, Vickki Hearing, BA, ACSM CEP, Exercise Physiologist   Supervising physician immediately available to respond to emergencies [name] immediately available ER MD   Physician(s) Corky Downs and [name] Balboa   Medication changes reported     No   Fall or balance concerns reported    No   Warm-up and Cool-down Performed as group-led Location manager Performed Yes   VAD Patient? No     Pain Assessment   Currently in Pain? No/denies         Goals Met:  Proper associated with RPD/PD & O2 Sat Independence with exercise equipment Exercise tolerated well Strength training completed today  Goals Unmet:  Not Applicable  Comments: Pt able to follow exercise prescription today without complaint.  Will continue to monitor for progression.    Dr. Emily Filbert is Medical Director for [place] and [name].

## 2016-10-04 DIAGNOSIS — J449 Chronic obstructive pulmonary disease, unspecified: Secondary | ICD-10-CM | POA: Diagnosis not present

## 2016-10-04 NOTE — Progress Notes (Signed)
Daily Session Note  Patient Details  Name: Darius Norris MRN: [id number] Date of Birth: 06/29/1951 Referring Provider:   Flowsheet Row Documentation from 09/06/2016 in [facility] Cardiac and Pulmonary Rehab  Referring Provider  [facility]      Encounter Date: 10/04/2016  Check In:     Session Check In - 10/04/16 1152      Check-In   Location [facility]-Cardiac & Pulmonary Rehab   Staff Present Laureen Brown, BS, RRT, Respiratory Therapist;Jessica Hawkins, MA, ACSM RCEP, Exercise Physiologist; , BA, ACSM CEP, Exercise Physiologist   Supervising physician immediately available to respond to emergencies [name] immediately available ER MD   Physician(s) [place] and [place]   Medication changes reported     No   Fall or balance concerns reported    No   Warm-up and Cool-down Performed as group-led instruction   Resistance Training Performed Yes   VAD Patient? No     Pain Assessment   Currently in Pain? No/denies         Goals Met:  Proper associated with RPD/PD & O2 Sat Independence with exercise equipment Exercise tolerated well Strength training completed today  Goals Unmet:  Not Applicable  Comments: Pt able to follow exercise prescription today without complaint.  Will continue to monitor for progression.    Dr. Mark Miller is Medical Director for [facility] and [name]. 

## 2016-10-09 ENCOUNTER — Encounter: Payer: Non-veteran care | Admitting: *Deleted

## 2016-10-09 DIAGNOSIS — J449 Chronic obstructive pulmonary disease, unspecified: Secondary | ICD-10-CM

## 2016-10-09 NOTE — Progress Notes (Signed)
Daily Session Note  Patient Details  Name: Darius Norris MRN: [id number] Date of Birth: [birth year] Referring Provider:   Arn Medal Row Documentation from 09/06/2016 in [facility] Cardiac and Pulmonary Rehab  Referring Provider  [facility]      Encounter Date: 10/09/2016  Check In:     Session Check In - 10/09/16 1125      Check-In   Location [facility]-Cardiac & Pulmonary Rehab   Staff Present Carson Myrtle, BS, RRT, Respiratory Therapist;Amberlyn Martinezgarcia Amedeo Plenty, BS, ACSM CEP, Exercise Physiologist;Amanda Oletta Darter, BA, ACSM CEP, Exercise Physiologist   Supervising physician immediately available to respond to emergencies [name] immediately available ER MD   Physician(s) Drs. Paduchow and Yao   Medication changes reported     No   Fall or balance concerns reported    No   Warm-up and Cool-down Performed on first and last piece of equipment   Resistance Training Performed Yes   VAD Patient? No     Pain Assessment   Currently in Pain? No/denies   Multiple Pain Sites No         Goals Met:  Proper associated with RPD/PD & O2 Sat Independence with exercise equipment Exercise tolerated well Strength training completed today  Goals Unmet:  Not Applicable  Comments: Pt able to follow exercise prescription today without complaint.  Will continue to monitor for progression.    Dr. Emily Filbert is Medical Director for [place] and [name].

## 2016-10-16 ENCOUNTER — Encounter: Payer: Self-pay | Admitting: *Deleted

## 2016-10-16 DIAGNOSIS — J449 Chronic obstructive pulmonary disease, unspecified: Secondary | ICD-10-CM

## 2016-10-16 NOTE — Progress Notes (Signed)
Pulmonary Individual Treatment Plan  Patient Details  Name: Darius Norris MRN: [id number] Date of Birth: [birth year] Referring Provider:   [place] Documentation from 09/06/2016 in [facility] Cardiac and Pulmonary Rehab  Referring Provider  [facility]      Initial Encounter Date:  [place] Documentation from 09/06/2016 in [facility] Cardiac and Pulmonary Rehab  Date  07/17/16  Referring Provider  [facility]      Visit Diagnosis: Moderate COPD (chronic obstructive pulmonary disease) ([place])  Patient's Home Medications on Admission:  Current Outpatient Prescriptions:  .  acetaminophen (TYLENOL) 325 MG tablet, Take 975 mg by mouth 3 (three) times daily., Disp: , Rfl:  .  amitriptyline (ELAVIL) 10 MG tablet, Take 30 mg by mouth at bedtime., Disp: , Rfl:  .  atorvastatin (LIPITOR) 80 MG tablet, Take 40 mg by mouth at bedtime., Disp: , Rfl:  .  budesonide-formoterol (SYMBICORT) 160-4.5 MCG/ACT inhaler, Inhale 2 puffs into the lungs 2 (two) times daily., Disp: , Rfl:  .  busPIRone (BUSPAR) 10 MG tablet, Take 10 mg by mouth 2 (two) times daily., Disp: , Rfl:  .  clobetasol cream (TEMOVATE) 2.42 %, Apply 1 application topically daily as needed. For itching on psoriasis areas, Disp: , Rfl:  .  diclofenac sodium (VOLTAREN) 1 % GEL, Apply 2 g topically 2 (two) times daily., Disp: , Rfl:  .  finasteride (PROSCAR) 5 MG tablet, Take 5 mg by mouth daily., Disp: , Rfl:  .  fluticasone (FLONASE) 50 MCG/ACT nasal spray, Place 2 sprays into both nostrils daily., Disp: , Rfl:  .  furosemide (LASIX) 20 MG tablet, Take 1 tablet (20 mg total) by mouth daily., Disp: 30 tablet, Rfl: 0 .  ipratropium (ATROVENT) 0.02 % nebulizer solution, Take 2.5 mLs (0.5 mg total) by nebulization every 6 (six) hours as needed for wheezing or shortness of breath., Disp: 75 mL, Rfl: 12 .  Ipratropium-Albuterol (COMBIVENT RESPIMAT) 20-100 MCG/ACT AERS respimat, Inhale 1 puff into the lungs every 6 (six) hours as needed  for wheezing or shortness of breath., Disp: , Rfl:  .  isosorbide mononitrate (IMDUR) 60 MG 24 hr tablet, Take 60 mg by mouth daily., Disp: , Rfl:  .  levofloxacin (LEVAQUIN) 750 MG tablet, Take 1 tablet (750 mg total) by mouth daily at 6 PM., Disp: 5 tablet, Rfl: 0 .  levofloxacin (LEVAQUIN) 750 MG tablet, Take 1 tablet (750 mg total) by mouth daily., Disp: 4 tablet, Rfl: 0 .  lisinopril (PRINIVIL,ZESTRIL) 20 MG tablet, Take 10 mg by mouth daily., Disp: , Rfl:  .  loratadine (CLARITIN) 10 MG tablet, Take 10 mg by mouth daily., Disp: , Rfl:  .  metoprolol (LOPRESSOR) 50 MG tablet, Take 100 mg by mouth 2 (two) times daily., Disp: , Rfl:  .  omeprazole (PRILOSEC) 20 MG capsule, Take 40 mg by mouth daily., Disp: , Rfl:  .  predniSONE (STERAPRED UNI-PAK 21 TAB) 10 MG (21) TBPK tablet, Take 1 tablet (10 mg total) by mouth daily. Taper as prescribed, Disp: 21 tablet, Rfl: 0 .  salicyclic acid-sulfur (SEBULEX) 2-2 % shampoo, Apply 1 application topically daily., Disp: , Rfl:  .  senna-docusate (SENOKOT-S) 8.6-50 MG tablet, Take 2 tablets by mouth 2 (two) times daily., Disp: , Rfl:  .  simethicone (MYLICON) 80 MG chewable tablet, Chew 80 mg by mouth 3 (three) times daily as needed for flatulence., Disp: , Rfl:   Past Medical History: Past Medical History:  Diagnosis Date  . CAD (coronary artery  disease)   . Colitis   . COPD (chronic obstructive pulmonary disease) ([place])   . Emphysema lung ([place])   . GERD (gastroesophageal reflux disease)   . Hypertension     Tobacco Use: History  Smoking Status  . Former Smoker  Smokeless Tobacco  . Never Used    Labs: Recent [employer] for ITP Cardiac and Pulmonary Rehab Latest Ref Rng & Units 02/04/2013 09/04/2016   Cholestrol 0 - 200 mg/dL 185 116   LDLCALC 0 - 99 mg/dL 114(H) 51   HDL >40 mg/dL 38(L) 42   Trlycerides <150 mg/dL 166 116   Hemoglobin A1c 4.8 - 5.6 % - 6.4(H)       ADL UCSD:     Pulmonary Assessment Scores     Row Name 06/27/16 1131 09/20/16 1000       ADL UCSD   ADL Phase Entry Mid    SOB Score total 50 77    Rest 1 1    Walk 3 3    Stairs 2 4    Bath 4 3    Dress 2 2    Shop 2 3       Pulmonary Function Assessment:     Pulmonary Function Assessment - 06/27/16 1129      Initial Spirometry Results   FVC% 40 %   FEV1% 34 %   FEV1/FVC Ratio 65.59     Post Bronchodilator Spirometry Results   FVC% 49 %   FEV1% 24 %   FEV1/FVC Ratio 56.11     Breath   Shortness of Breath Yes;Fear of Shortness of Breath;Limiting activity      Exercise Target Goals:    Exercise Program Goal: Individual exercise prescription set with THRR, safety & activity barriers. Participant demonstrates ability to understand and report RPE using BORG scale, to self-measure pulse accurately, and to acknowledge the importance of the exercise prescription.  Exercise Prescription Goal: Starting with aerobic activity 30 plus minutes a day, 3 days per week for initial exercise prescription. Provide home exercise prescription and guidelines that participant acknowledges understanding prior to discharge.  Activity Barriers & Risk Stratification:     Activity Barriers & Cardiac Risk Stratification - 06/27/16 1128      Activity Barriers & Cardiac Risk Stratification   Activity Barriers Shortness of Breath;Deconditioning   Cardiac Risk Stratification Moderate      6 Minute Walk:     6 Minute Walk    Row Name 06/27/16 1034 09/20/16 1053       6 Minute Walk   Phase  - Mid Program    Distance 500 feet 888 feet    Distance % Change  - 77 %    Walk Time 4 minutes 5.58 minutes    # of Rest Breaks  - 1    MPH 1.42 1.8    METS 1.6 2.48    RPE 15 20    Perceived Dyspnea  4 7    VO2 Peak 5.5 8.7    Symptoms Yes (comment) Yes (comment)    Comments low back pain more than breathing low back pain    Resting HR 69 bpm 102 bpm    Resting BP 138/74 138/68    Max Ex. HR 87 bpm 118 bpm    Max Ex. BP 152/72  146/64      Interval HR   Baseline HR  - 2    1 Minute HR  - 111  2 Minute HR  - 111    3 Minute HR  - 115    5 Minute HR  - 115    6 Minute HR  - 118    Interval Heart Rate?  - Yes      Interval Oxygen   Interval Oxygen?  - Yes    Baseline Oxygen Saturation %  - 96 %    Baseline Liters of Oxygen  - 2 L    1 Minute Oxygen Saturation %  - 91 %    1 Minute Liters of Oxygen  - 2 L    2 Minute Oxygen Saturation %  - 90 %    2 Minute Liters of Oxygen  - 2 L    3 Minute Oxygen Saturation %  - 90 %    3 Minute Liters of Oxygen  - 2 L    4 Minute Liters of Oxygen  - 2 L    5 Minute Oxygen Saturation %  - 91 %    5 Minute Liters of Oxygen  - 2 L    6 Minute Oxygen Saturation %  - 89 %    6 Minute Liters of Oxygen  - 2 L    2 Minute Post Liters of Oxygen  - 2 L       Initial Exercise Prescription:     Initial Exercise Prescription - 09/06/16 1400      Date of Initial Exercise RX and Referring Provider   Date 07/17/16   Referring Provider [facility]      Perform Capillary Blood Glucose checks as needed.  Exercise Prescription Changes:     Exercise Prescription Changes    Row Name 07/17/16 1100 07/19/16 1400 08/01/16 1500 08/02/16 1400 08/16/16 1500     Exercise Review   Progression  - Yes Yes Yes Yes     Response to Exercise   Blood Pressure (Admit) 140/78 120/70 120/66  - 138/68   Blood Pressure (Exercise) 132/70 164/76 164/80  - 152/70   Blood Pressure (Exit) 138/70 130/64 128/68  - 122/64   Heart Rate (Admit) 78 bpm 73 bpm 88 bpm  - 82 bpm   Heart Rate (Exercise) 101 bpm 84 bpm 87 bpm  - 77 bpm   Heart Rate (Exit) 87 bpm 74 bpm 72 bpm  - 69 bpm   Oxygen Saturation (Admit) 96 % 92 % 98 %  - 96 %   Oxygen Saturation (Exercise) 92 % 91 % 95 %  - 94 %   Oxygen Saturation (Exit) 95 % 95 % 96 %  - 96 %   Rating of Perceived Exertion (Exercise) _0 - 12   Perceived Dyspnea (Exercise) _1 - 33   Symptoms  -  -  - Home Exercise Guidelines given 08/02/16  Home Exercise Guidelines given 08/02/16   Comments  -  - none none none   Duration Progress to 30 minutes of continuous aerobic without signs/symptoms of physical distress Progress to 45 minutes of aerobic exercise without signs/symptoms of physical distress Progress to 45 minutes of aerobic exercise without signs/symptoms of physical distress Progress to 45 minutes of aerobic exercise without signs/symptoms of physical distress Progress to 45 minutes of aerobic exercise without signs/symptoms of physical distress   Intensity THRR unchanged  103-139 THRR unchanged THRR unchanged THRR unchanged THRR unchanged     Progression   Progression Continue progressive overload as per policy without signs/symptoms or physical distress.  Continue to progress workloads to maintain intensity without signs/symptoms of physical distress. Continue to progress workloads to maintain intensity without signs/symptoms of physical distress. Continue to progress workloads to maintain intensity without signs/symptoms of physical distress. Continue to progress workloads to maintain intensity without signs/symptoms of physical distress.   Average METs 1.9 2 2.03 2.03 2.23     Resistance Training   Training Prescription _0    Weight _1 lbs 4 lbs 4 lbs   Reps 10-15 10-15 10-15 10-15 10-15     Interval Training   Interval Training  - No No No No     Oxygen   Oxygen _2    Liters _3 Recumbant Bike   Level 1 -  omitted due to time today 1.5 1.5 1.5   RPM 30  - 45 45 42   Minutes 6  3-3 intervals  - _4 METs 1.5  - 2.3 2.3 2.3     REL-XR   Level _5 Minutes _6 METs 1.9 2 1.8 1.8 2.4     Biostep-RELP   Level _7 Minutes _8 METs _9 Home Exercise Plan   Plans to continue exercise at  -  -  - Home  walking and stationary bike Home  walking and stationary bike    Frequency  -  -  - Add 1 additional day to program exercise sessions. Add 1 additional day to program exercise sessions.   [place] Name 08/29/16 1400 09/13/16 1200 09/28/16 1100 10/12/16 1100       Exercise Review   Progression Yes Yes Yes Yes      Response to Exercise   Blood Pressure (Admit) 132/78 130/60  - 150/82    Blood Pressure (Exercise) 140/68 140/70 142/80 160/80    Blood Pressure (Exit) 136/62 116/64 114/64 124/68    Heart Rate (Admit) 75 bpm 83 bpm  - 102 bpm    Heart Rate (Exercise) 78 bpm 81 bpm 95 bpm 91 bpm    Heart Rate (Exit) 69 bpm 70 bpm 84 bpm 74 bpm    Oxygen Saturation (Admit) 89 % 91 %  - 99 %    Oxygen Saturation (Exercise) 97 % 96 % 94 % 91 %    Oxygen Saturation (Exit) 95 % 98 % 94 % 95 %    Rating of Perceived Exertion (Exercise) _10 Perceived Dyspnea (Exercise) _11 Symptoms Home Exercise Guidelines given 08/02/16  -  -  -    Comments none none none none    Duration Progress to 45 minutes of aerobic exercise without signs/symptoms of physical distress Progress to 45 minutes of aerobic exercise without signs/symptoms of physical distress Progress to 45 minutes of aerobic exercise without signs/symptoms of physical distress Progress to 45 minutes of aerobic exercise without signs/symptoms of physical distress    Intensity THRR unchanged THRR unchanged THRR unchanged THRR unchanged      Progression   Progression Continue to progress workloads to maintain intensity without signs/symptoms of physical distress. Continue to progress workloads to maintain intensity without signs/symptoms of physical distress. Continue to progress workloads to maintain intensity without signs/symptoms  of physical distress. Continue to progress workloads to maintain intensity without signs/symptoms of physical distress.    Average METs 2.07 2.1 2 2.2      Resistance Training   Training Prescription Yes Yes Yes Yes    Weight 4 lbs _0 Reps 10-15 10-15 10-15 10-12       Interval Training   Interval Training No Yes Yes Yes      Oxygen   Oxygen Continuous Continuous Continuous Continuous    Liters _1 Recumbant Bike   Level 1._2 RPM 53  - 44 24    Minutes _3 METs 2.3 2.5  - 2.4      REL-XR   Level _4 Minutes _5 METs 1.9 1.7 1.9 1.6      Biostep-RELP   Level _6 Minutes _7 METs 2  - 2.4 2      Home Exercise Plan   Plans to continue exercise at Home  walking and stationary bike  -  -  -    Frequency Add 2 additional days to program exercise sessions.  -  -  -       Exercise Comments:     Exercise Comments    Row Name 07/17/16 1136 07/19/16 1442 08/01/16 1535 08/02/16 1118 08/16/16 1509   Exercise Comments Today was the patient's first day of class. The patient's initial exercise prescription (based on the 6 min walk evaluation) was reviewed with the patient. [name] is off to a good start with exercise.  He had now done two sessions.  He already made some improvement.  We will continue to monitor for improvement. [name] continues to do well with exercise. He is now up to 8 minutes on the recumbent bike continuosly.  We wil continue to monitor for progression. Reviewed home exercise with pt today.  Pt plans to walk and use stationary bike at home for exercise.  Reviewed THR, pulse, RPE, sign and symptoms, and when to call 911 or MD.  Also discussed weather considerations and indoor options.  Pt voiced understanding. [name] is doing well with his exercise.  We are still working on building up time on the recumbent bike.  We will continue to monitor for progression.   Row Name 08/29/16 1423 09/13/16 1300 09/28/16 1157 10/12/16 1137     Exercise Comments [name] continues to do well with exercise.  He is now doing level 4 on the BioStep!  We will continue to monitor for progression. [name] is proressing well with exercise. [name] continues to progress well with exercise. [name] continues to  progress well with exercise.       Discharge Exercise Prescription (Final Exercise Prescription Changes):     Exercise Prescription Changes - 10/12/16 1100      Exercise Review   Progression Yes     Response to Exercise   Blood Pressure (Admit) 150/82   Blood Pressure (Exercise) 160/80   Blood Pressure (Exit) 124/68   Heart Rate (Admit) 102 bpm   Heart Rate (Exercise) 91 bpm   Heart Rate (Exit) 74 bpm   Oxygen Saturation (Admit) 99 %   Oxygen Saturation (Exercise) 91 %   Oxygen Saturation (Exit) 95 %   Rating of Perceived Exertion (Exercise) 14  Perceived Dyspnea (Exercise) 3   Comments none   Duration Progress to 45 minutes of aerobic exercise without signs/symptoms of physical distress   Intensity THRR unchanged     Progression   Progression Continue to progress workloads to maintain intensity without signs/symptoms of physical distress.   Average METs 2.2     Resistance Training   Training Prescription Yes   Weight 4   Reps 10-12     Interval Training   Interval Training Yes     Oxygen   Oxygen Continuous   Liters 2     Recumbant Bike   Level 2   RPM 24   Minutes 15   METs 2.4     REL-XR   Level 4   Minutes 15   METs 1.6     Biostep-RELP   Level 4   Minutes 15   METs 2       Nutrition:  Target Goals: Understanding of nutrition guidelines, daily intake of sodium <1555m, cholesterol <2032m calories 30% from fat and 7% or less from saturated fats, daily to have 5 or more servings of fruits and vegetables.  Biometrics:     Pre Biometrics - 06/27/16 1034      Pre Biometrics   Height _0  (1.676 m)   Weight 204 lb 6.4 oz (92.7 kg)   Waist Circumference 49 inches   Hip Circumference 48 inches   Waist to Hip Ratio 1.02 %   BMI (Calculated) 33.1       Nutrition Therapy Plan and Nutrition Goals:   Nutrition Discharge: Rate Your Plate Scores:   Psychosocial: Target Goals: Acknowledge presence or absence of depression, maximize  coping skills, provide positive support system. Participant is able to verbalize types and ability to use techniques and skills needed for reducing stress and depression.  Initial Review & Psychosocial Screening:     Initial Psych Review & Screening - 06/27/16 11WaggonerYes   Comments Mr RiCrucesas good support from his wife. He admits to panic attacks with shortness of breath and has recently been set-up with a therapist for counciling. He states that this treatment will help along with [name] exercise with suppervision.     Barriers   Psychosocial barriers to participate in program The patient should benefit from training in stress management and relaxation.     Screening Interventions   Interventions Encouraged to exercise;Program counselor consult      Quality of Life Scores:     Quality of Life - 06/27/16 1145      Quality of Life Scores   Health/Function Pre 19.5 %   Socioeconomic Pre 19.17 %   Psych/Spiritual Pre 19 %   Family Pre 20.6 %   GLOBAL Pre 19.5 %      PHQ-9: Recent Review Flowsheet Data    [facility]   Decreased Interest 0   Down, Depressed, Hopeless 0   PHQ - 2 Score 0   Altered sleeping 1   Tired, decreased energy 2   Change in appetite 1   Feeling bad or failure about yourself  0   Trouble concentrating 0   Moving slowly or fidgety/restless 0   Suicidal thoughts 0   PHQ-9 Score 4   Difficult doing work/chores Somewhat difficult      Psychosocial Evaluation and Intervention:     Psychosocial Evaluation - 07/17/16 1058      Psychosocial Evaluation &  Interventions   Comments Counselor met with Mr. Seamans today for initial psychosocial evaluation.   He was in this program approximately 1 year ago and reports having a "bad episode" about a month ago which resulted in his Dr. recommending a return to Pulmonary Rehab.  Mr. Lasch has COPD and is otherwise in fairly good health.   He has a spouse of 78 years, an adult daughter and several sisters who live close by and are his strong support system.  Mr. Ortner states he sleeps okay with the help of OTC Melatonin at night as needed.  His appetite is good as well.  Mr. Mathey reports a recent history of panic attacks resulting in his psychiatrist adding a medication for this which he states is Buspiron @ 57m.  This counselor did not see this medication on Mr. Lemming's list and asked him to bring it to next class for a nurse to add it to the medication list.  He states this has been helpful in addressing these symptoms.  Mr. RStaniszewskireports his mood is "okay" and his health is his primary stress at this time.  Mr. RAlterhas goals to lose weight; breathe better; and increase his stamina while in this program.  Counselor encouraged Mr. RGorrellto begin considering how he will maintain exercising consistently upon completion of this program and he agreed to work on this as a goal as well.        Psychosocial Re-Evaluation:     Psychosocial Re-Evaluation    [place] 07/31/16 1136 08/30/16 1031 09/20/16 1124         Psychosocial Re-Evaluation   Interventions  - Encouraged to attend Cardiac Rehabilitation for the exercise  -     Comments Counselor follow up with Mr. RRoigtoday with him reporting he has noticed improvement in his stamina and endurance and able to do more normal activities since coming back into this program.  He is sleeping better with the help of OTC natural sleep aid and reports that his panic symptoms has decreased as well with exercise and the new medications.  Counselor commended Mr. R for his progress and commitment to consistency in exercise.   Mr. JAlphons Burgertcalled to say he was sorry that he can't be in [facility] today since he has a cold. He hopes to return on Monday.  Follow up with Mr. RAlfonso Pattentoday with counselor reporting doing so much better and most normal activities have gotten easier  as a result of this program.  He has improved on his 6 minute walk and [place] the [place].  His mood remains positive; although he is concerned and sad about the recent [facility]  His means of coping are by exercise and choosing to focus on positive.  Mr. RAlfonso Pattenalso noted the positive aspects of the socialization he gets within this program by exercising with others and with support from staff.  He reports sleeping and appetite are good and his social supports are still positive and in place.  He is planning to continue consistently exercising and is looking for a follow up program.  Counselor encouraged to talk with the exercise physiologists for further options.  Counselor will continue to follow with Mr. RAlfonso Patten       Education: Education Goals: Education classes will be provided on a weekly basis, covering required topics. Participant will state understanding/return demonstration of topics presented.  Learning Barriers/Preferences:  Learning Barriers/Preferences - 06/27/16 1128      Learning Barriers/Preferences   Learning Barriers None   Learning Preferences Group Instruction;Individual Instruction;Pictoral;Skilled Demonstration;Verbal Instruction;Video;Written Material      Education Topics: Initial Evaluation Education: - Verbal, written and demonstration of respiratory meds, RPE/PD scales, oximetry and breathing techniques. Instruction on use of nebulizers and MDIs: cleaning and proper use, rinsing mouth with steroid doses and importance of monitoring MDI activations. Flowsheet Row Pulmonary Rehab from 07/17/2016 in [facility] Cardiac and Pulmonary Rehab  Date  07/17/16  Educator  [facility]  Instruction Review Code  2- meets goals/outcomes      General Nutrition Guidelines/Fats and Fiber: -Group instruction provided by verbal, written material, models and posters to present the general guidelines for heart healthy nutrition. Gives an explanation and  review of dietary fats and fiber.   Controlling Sodium/Reading Food Labels: -Group verbal and written material supporting the discussion of sodium use in heart healthy nutrition. Review and explanation with models, verbal and written materials for utilization of the food label.   Exercise Physiology & Risk Factors: - Group verbal and written instruction with models to review the exercise physiology of the cardiovascular system and associated critical values. Details cardiovascular disease risk factors and the goals associated with each risk factor.   Aerobic Exercise & Resistance Training: - Gives group verbal and written discussion on the health impact of inactivity. On the components of aerobic and resistive training programs and the benefits of this training and how to safely progress through these programs. Flowsheet Row Pulmonary Rehab from 04/21/2015 in [place]  Date  04/21/15  Educator  [place]  Instruction Review Code  2- meets goals/outcomes      Flexibility, Balance, General Exercise Guidelines: - Provides group verbal and written instruction on the benefits of flexibility and balance training programs. Provides general exercise guidelines with specific guidelines to those with heart or lung disease. Demonstration and skill practice provided. Flowsheet Row Pulmonary Rehab from 04/21/2015 in [place]  Date  04/21/15  Educator  [place]  Instruction Review Code  2- meets goals/outcomes      Stress Management: - Provides group verbal and written instruction about the health risks of elevated stress, cause of high stress, and healthy ways to reduce stress.   Depression: - Provides group verbal and written instruction on the correlation between heart/lung disease and depressed mood, treatment options, and the stigmas associated with seeking treatment.   Exercise & Equipment Safety: -  Individual verbal instruction and demonstration of equipment use and safety with use of the equipment. Flowsheet Row Pulmonary Rehab from 07/17/2016 in [facility] Cardiac and Pulmonary Rehab  Date  07/17/16  Educator  [facility]  Instruction Review Code  2- meets goals/outcomes      Infection Prevention: - Provides verbal and written material to individual with discussion of infection control including proper hand washing and proper equipment cleaning during exercise session. Flowsheet Row Pulmonary Rehab from 07/17/2016 in [facility] Cardiac and Pulmonary Rehab  Date  07/17/16  Educator  [facility]  Instruction Review Code  2- meets goals/outcomes      Falls Prevention: - Provides verbal and written material to individual with discussion of falls prevention and safety. Flowsheet Row Pulmonary Rehab from 07/17/2016 in [facility] Cardiac and Pulmonary Rehab  Date  06/27/16  Educator  [facility]  Instruction Review Code  2- meets goals/outcomes      Diabetes: - Individual verbal and written instruction to review signs/symptoms  of diabetes, desired ranges of glucose level fasting, after meals and with exercise. Advice that pre and post exercise glucose checks will be done for 3 sessions at entry of program.   Chronic Lung Diseases: - Group verbal and written instruction to review new updates, new respiratory medications, new advancements in procedures and treatments. Provide informative websites and "800" numbers of self-education. Flowsheet Row Pulmonary Rehab from 04/26/2015 in [place]  Date  04/26/15  Educator  LB  Instruction Review Code  2- meets goals/outcomes      Lung Procedures: - Group verbal and written instruction to describe testing methods done to diagnose lung disease. Review the outcome of test results. Describe the treatment choices: Pulmonary Function Tests, ABGs and oximetry. Flowsheet Row Pulmonary Rehab from 04/30/2015 in [place]  Date  04/30/15  Educator  [place]  Instruction Review Code  2- meets goals/outcomes      Energy Conservation: - Provide group verbal and written instruction for methods to conserve energy, plan and organize activities. Instruct on pacing techniques, use of adaptive equipment and posture/positioning to relieve shortness of breath. Flowsheet Row Pulmonary Rehab from 05/24/2015 in [place]  Date  05/12/15  Educator  SW  Instruction Review Code  2- meets goals/outcomes      Triggers: - Group verbal and written instruction to review types of environmental controls: home humidity, furnaces, filters, dust mite/pet prevention, HEPA vacuums. To discuss weather changes, air quality and the benefits of nasal washing.   Exacerbations: - Group verbal and written instruction to provide: warning signs, infection symptoms, calling MD promptly, preventive modes, and value of vaccinations. Review: effective airway clearance, coughing and/or vibration techniques. Create an Sports administrator. Flowsheet Row Pulmonary Rehab from 05/24/2015 in [place]  Date  05/24/15  Educator  L.Owens Shark  Instruction Review Code  2- meets goals/outcomes      Oxygen: - Individual and group verbal and written instruction on oxygen therapy. Includes supplement oxygen, available portable oxygen systems, continuous and intermittent flow rates, oxygen safety, concentrators, and Medicare reimbursement for oxygen. Flowsheet Row Pulmonary Rehab from 07/17/2016 in [facility] Cardiac and Pulmonary Rehab  Date  06/27/16  Educator  LB  Instruction Review Code  2- meets goals/outcomes      Respiratory Medications: - Group verbal and written instruction to review medications for lung disease. Drug class, frequency, complications, importance of spacers, rinsing mouth after steroid MDI's, and proper cleaning methods for nebulizers. Flowsheet Row Pulmonary Rehab from  07/17/2016 in [facility] Cardiac and Pulmonary Rehab  Date  06/27/16  Educator  LB  Instruction Review Code  2- meets goals/outcomes      AED/CPR: - Group verbal and written instruction with the use of models to demonstrate the basic use of the AED with the basic ABC's of resuscitation.   Breathing Retraining: - Provides individuals verbal and written instruction on purpose, frequency, and proper technique of diaphragmatic breathing and pursed-lipped breathing. Applies individual practice skills. Flowsheet Row Pulmonary Rehab from 07/17/2016 in [facility] Cardiac and Pulmonary Rehab  Date  07/17/16  Educator  [facility]  Instruction Review Code  2- meets goals/outcomes      Anatomy and Physiology of the Lungs: - Group verbal and written instruction with the use of models to provide basic lung anatomy and physiology related to function, structure and complications of lung disease.   Heart Failure: - Group verbal and written instruction on the basics of  heart failure: signs/symptoms, treatments, explanation of ejection fraction, enlarged heart and cardiomyopathy.   Sleep Apnea: - Individual verbal and written instruction to review Obstructive Sleep Apnea. Review of risk factors, methods for diagnosing and types of masks and machines for OSA.   Anxiety: - Provides group, verbal and written instruction on the correlation between heart/lung disease and anxiety, treatment options, and management of anxiety.   Relaxation: - Provides group, verbal and written instruction about the benefits of relaxation for patients with heart/lung disease. Also provides patients with examples of relaxation techniques. Flowsheet Row Pulmonary Rehab from 05/05/2015 in [place]  Date  05/05/15  Educator  Lucianne Lei LCSW  Instruction Review Code  2- Meets goals/outcomes      Knowledge Questionnaire Score:     Knowledge Questionnaire Score - 06/27/16 1129      Knowledge  Questionnaire Score   Pre Score 8/10       Core Components/Risk Factors/Patient Goals at Admission:     Personal Goals and Risk Factors at Admission - 06/27/16 1136      Core Components/Risk Factors/Patient Goals on Admission    Weight Management Yes;Weight Loss   Intervention Weight Management: Develop a combined nutrition and exercise program designed to reach desired caloric intake, while maintaining appropriate intake of nutrient and fiber, sodium and fats, and appropriate energy expenditure required for the weight goal.;Weight Management: Provide education and appropriate resources to help participant work on and attain dietary goals.   Admit Weight 204 lb 6.4 oz (92.7 kg)   Goal Weight: Short Term 200 lb (90.7 kg)   Goal Weight: Long Term 150 lb (68 kg)   Expected Outcomes Short Term: Continue to assess and modify interventions until short term weight is achieved   Sedentary Yes   Intervention Provide advice, education, support and counseling about physical activity/exercise needs.;Develop an individualized exercise prescription for aerobic and resistive training based on initial evaluation findings, risk stratification, comorbidities and participant's personal goals.  Home stationary exercise bike   Expected Outcomes Achievement of increased cardiorespiratory fitness and enhanced flexibility, muscular endurance and strength shown through measurements of functional capacity and personal statement of participant.   Increase Strength and Stamina Yes   Intervention Provide advice, education, support and counseling about physical activity/exercise needs.;Develop an individualized exercise prescription for aerobic and resistive training based on initial evaluation findings, risk stratification, comorbidities and participant's personal goals.   Expected Outcomes Achievement of increased cardiorespiratory fitness and enhanced flexibility, muscular endurance and strength shown through  measurements of functional capacity and personal statement of participant.   Improve shortness of breath with ADL's Yes   Intervention Provide education, individualized exercise plan and daily activity instruction to help decrease symptoms of SOB with activities of daily living.   Expected Outcomes Short Term: Achieves a reduction of symptoms when performing activities of daily living.   Develop more efficient breathing techniques such as purse lipped breathing and diaphragmatic breathing; and practicing self-pacing with activity Yes   Intervention Provide education, demonstration and support about specific breathing techniuqes utilized for more efficient breathing. Include techniques such as pursed lipped breathing, diaphragmatic breathing and self-pacing activity.   Expected Outcomes Short Term: Participant will be able to demonstrate and use breathing techniques as needed throughout daily activities.   Increase knowledge of respiratory medications and ability to use respiratory devices properly  Yes   Intervention Provide education and demonstration as needed of appropriate use of medications, inhalers, and oxygen therapy.  Oxygen 2l/m ; MDI's:  Combivent Respimat, Symbicort; Svn: Albuterol; Uses a spacer   Expected Outcomes Short Term: Achieves understanding of medications use. Understands that oxygen is a medication prescribed by physician. Demonstrates appropriate use of inhaler and oxygen therapy.   Hypertension Yes   Intervention Provide education on lifestyle modifcations including regular physical activity/exercise, weight management, moderate sodium restriction and increased consumption of fresh fruit, vegetables, and low fat dairy, alcohol moderation, and smoking cessation.;Monitor prescription use compliance.   Expected Outcomes Short Term: Continued assessment and intervention until BP is < 140/75m HG in hypertensive participants. < 130/861mHG in hypertensive participants with diabetes,  heart failure or chronic kidney disease.;Long Term: Maintenance of blood pressure at goal levels.      Core Components/Risk Factors/Patient Goals Review:      Goals and Risk Factor Review    Row Name 07/17/16 1126 07/21/16 1215 08/02/16 1158 09/14/16 0957 09/18/16 1501     Core Components/Risk Factors/Patient Goals Review   Personal Goals Review Develop more efficient breathing techniques such as purse lipped breathing and diaphragmatic breathing and practicing self-pacing with activity. Weight Management/Obesity;Hypertension Increase knowledge of respiratory medications and ability to use respiratory devices properly.;Improve shortness of breath with ADL's;Sedentary;Develop more efficient breathing techniques such as purse lipped breathing and diaphragmatic breathing and practicing self-pacing with activity. Improve shortness of breath with ADL's;Increase Strength and Stamina;Increase knowledge of respiratory medications and ability to use respiratory devices properly.;Develop more efficient breathing techniques such as purse lipped breathing and diaphragmatic breathing and practicing self-pacing with activity. Increase knowledge of respiratory medications and ability to use respiratory devices properly.   Review Discussed pursed lip breathing techniques with patient. He stated that he is comfortable using pursed lip breathing and uses it in his daily activities to help control SOB.  Discussed the two risk factors with MiRonalee Beltsoday. He has goal od weight down to 200 lbs short term. At 204 lbs now, Was 210 not to long ago.  He stated that he still eats a variey of foods fried and grilled.  He has deferred an RD appointment. I di remind hiom that the RD is available if he does not see weight change as desired, so he can review his nutrition plan. He is walking at home as well as coming to the prgoram. He is new to the program and expects to see change as he comes to the sessions. Hypertension is controlled  at present time with medications nd the exercise he is doing in the program and at home.  He stated he is aware to watch for the amount of sodium intake per day.  Mr RiGallieroticed a difference walking into [name] today - easier and less shortness of breath. He is walking around the house more and plans to start using his stationary bike at home. He has good technique with his PLB. Mr [place] [facility] off due to an COPD excerbation. He had his SVN changed to Duoneb and was educated on the 2 medicine in the [place] has a good understanding of his inhalers and oxygen. PLB is a daily therapy for him with activity. He has improved his exercise goals and has more stamina for activities at home and walking. Mr RiDuess complaining of increased sputum production after his exacerbation which was treated with 2 antibiotics and 2 rounds of predisone. Educated him on increasing his water intake to 8 classes a day to liquidfy his sputum and showed him a flutter valve. He has an appointment with  his pulmonologist this Thursday at the [place] and will ask about it.    Expected Outcomes Patient will continue to use pursed lip breathing during his exercise sessions in order to be able to continue with progression of workloads to increase strengh and stamina.  Weight loss goal to 200 lbs met prior to discharge. RD inout if no changes noted midway through program.  COntinued compliance with medications , continued exercise for blood pressure control maintenance  -  -  -   Row Name 09/27/16 1540 10/11/16 0713 10/11/16 0716         Core Components/Risk Factors/Patient Goals Review   Personal Goals Review Increase knowledge of respiratory medications and ability to use respiratory devices properly. Develop more efficient breathing techniques such as purse lipped breathing and diaphragmatic breathing and practicing self-pacing with activity.;Sedentary;Improve shortness of breath with  ADL's;Increase knowledge of respiratory medications and ability to use respiratory devices properly.;Increase Strength and Stamina Weight Management/Obesity;Hypertension     Review Mr Mckinnie received from the [place] a new flutter valve called the VibraPep. He is using it twice a day and has increased his sputum production.  - Mr Karie Soda is progressing in [place] with his exercise goals. He has a stationary bike at home, although he has not used it during [employer]. His blood pressure readings are acceptable and his weight is consistent throughout the program. He has a good understanding of his MDI's and is very compliant the schedule. Due to a consistent cough as of late, he has added Mucinex which has helped him expectorate. A recent CT of the lungs confirmed clear lungs. PLB is a daily technique used to manage his COPD, as well as his oxygen. He is noticing improvement in his stamina and does attend regularly.     Expected Outcomes Continue using flutter valve for secretion mobilization.  - Continue attended [name] for improvement in his exercise capacity and management skills for COPD.        Core Components/Risk Factors/Patient Goals at Discharge (Final Review):      Goals and Risk Factor Review - 10/11/16 0716      Core Components/Risk Factors/Patient Goals Review   Personal Goals Review Weight Management/Obesity;Hypertension   Review Mr Karie Soda is progressing in [name] with his exercise goals. He has a stationary bike at home, although he has not used it during [employer]. His blood pressure readings are acceptable and his weight is consistent throughout the program. He has a good understanding of his MDI's and is very compliant the schedule. Due to a consistent cough as of late, he has added Mucinex which has helped him expectorate. A recent CT of the lungs confirmed clear lungs. PLB is a daily technique used to manage his COPD, as well as his oxygen. He is noticing improvement in his stamina  and does attend regularly.   Expected Outcomes Continue attended [name] for improvement in his exercise capacity and management skills for COPD.      ITP Comments:     ITP Comments    Row Name 08/30/16 1031 09/06/16 1419 09/13/16 1301       ITP Comments Mr. Daegen Berrocal called to say he was sorry that he can't be in [facility] today since he has a cold. He hopes to return on Monday.  [name] called to let us know that he had been out sick.  He was admitted to hospital for observation over night with increased shortness of breath.  He hopes to be able to return  to rehab on Monday.  [name] is progressing well with exercise.        Comments: 30 Day Review

## 2016-10-18 ENCOUNTER — Encounter: Payer: Non-veteran care | Attending: Internal Medicine

## 2016-10-18 DIAGNOSIS — J449 Chronic obstructive pulmonary disease, unspecified: Secondary | ICD-10-CM | POA: Diagnosis present

## 2016-10-18 NOTE — Progress Notes (Signed)
Daily Session Note  Patient Details  Name: KRISTION HOLIFIELD MRN: [id number] Date of Birth: [birth year] Referring Provider:   Arn Medal Row Documentation from 09/06/2016 in [facility] Cardiac and Pulmonary Rehab  Referring Provider  [facility]      Encounter Date: 10/18/2016  Check In:     Session Check In - 10/18/16 1056      Check-In   Location [facility]-Cardiac & Pulmonary Rehab   Staff Present Carson Myrtle, BS, RRT, Respiratory Lennie Hummer, MA, ACSM RCEP, Exercise Physiologist;Cambry Spampinato Oletta Darter, BA, ACSM CEP, Exercise Physiologist   Supervising physician immediately available to respond to emergencies [name] immediately available ER MD   Physician(s) Alfred Levins and Reita Cliche   Medication changes reported     No   Fall or balance concerns reported    No   Warm-up and Cool-down Performed as group-led Location manager Performed Yes   VAD Patient? No     Pain Assessment   Currently in Pain? No/denies         Goals Met:  Proper associated with RPD/PD & O2 Sat Independence with exercise equipment Exercise tolerated well Strength training completed today  Goals Unmet:  Not Applicable  Comments: Pt able to follow exercise prescription today without complaint.  Will continue to monitor for progression.    Dr. Emily Filbert is Medical Director for [place] and [name].

## 2016-10-20 DIAGNOSIS — J449 Chronic obstructive pulmonary disease, unspecified: Secondary | ICD-10-CM | POA: Diagnosis not present

## 2016-10-20 NOTE — Progress Notes (Signed)
Daily Session Note  Patient Details  Name: Darius Norris MRN: [id number] Date of Birth: 14-Nov-1951 Referring Provider:   Arn Medal Row Documentation from 09/06/2016 in [facility] Cardiac and Pulmonary Rehab  Referring Provider  [facility]      Encounter Date: 10/20/2016  Check In:     Session Check In - 10/20/16 1210      Check-In   Location [facility]-Cardiac & Pulmonary Rehab   Staff Present Heath Lark, RN, BSN, CCRP;Laureen Owens Shark, BS, RRT, Respiratory Dareen Piano, BA, ACSM CEP, Exercise Physiologist   Supervising physician immediately available to respond to emergencies [name] immediately available ER MD   Physician(s) Ms Henrene Pastor adamantly refused to do the mid 6 min walk test.   Medication changes reported     No   Fall or balance concerns reported    No   Warm-up and Cool-down Performed as group-led instruction   Resistance Training Performed Yes   VAD Patient? No     Pain Assessment   Currently in Pain? No/denies         Goals Met:  Proper associated with RPD/PD & O2 Sat Independence with exercise equipment Exercise tolerated well Strength training completed today  Goals Unmet:  Not Applicable  Comments: Pt able to follow exercise prescription today without complaint.  Will continue to monitor for progression.    Dr. Emily Filbert is Medical Director for [place] and [name].

## 2016-10-25 ENCOUNTER — Encounter: Payer: Non-veteran care | Admitting: *Deleted

## 2016-10-25 DIAGNOSIS — J449 Chronic obstructive pulmonary disease, unspecified: Secondary | ICD-10-CM

## 2016-10-25 NOTE — Progress Notes (Signed)
Daily Session Note  Patient Details  Name: RIGOBERTO REPASS MRN: [id number] Date of Birth: 20-Aug-1951 Referring Provider:   Arn Medal Row Documentation from 09/06/2016 in [facility] Cardiac and Pulmonary Rehab  Referring Provider  [facility]      Encounter Date: 10/25/2016  Check In:     Session Check In - 10/25/16 1142      Check-In   Location [facility]-Cardiac & Pulmonary Rehab   Staff Present Heath Lark, RN, BSN, CCRP;Ashelynn Marks, RN, Vickki Hearing, BA, ACSM CEP, Exercise Physiologist   Supervising physician immediately available to respond to emergencies [name] immediately available ER MD   Physician(s) Dr. Ciro Backer and Burlene Arnt   Medication changes reported     No   Fall or balance concerns reported    Yes   Warm-up and Cool-down Performed as group-led instruction   Resistance Training Performed Yes   VAD Patient? No     Pain Assessment   Currently in Pain? No/denies         Goals Met:  Proper associated with RPD/PD & O2 Sat Exercise tolerated well  Goals Unmet:  Not Applicable  Comments:     Dr. Emily Filbert is Medical Director for [place] and [name].

## 2016-10-27 ENCOUNTER — Encounter: Payer: Non-veteran care | Admitting: *Deleted

## 2016-10-27 DIAGNOSIS — J449 Chronic obstructive pulmonary disease, unspecified: Secondary | ICD-10-CM | POA: Diagnosis not present

## 2016-10-27 NOTE — Progress Notes (Signed)
Daily Session Note  Patient Details  Name: Darius Norris MRN: [id number] Date of Birth: Apr 18, 1951 Referring Provider:   Arn Medal Row Documentation from 09/06/2016 in [facility] Cardiac and Pulmonary Rehab  Referring Provider  [facility]      Encounter Date: 10/27/2016  Check In:     Session Check In - 10/27/16 1125      Check-In   Location [facility]-Cardiac & Pulmonary Rehab   Staff Present Gerlene Burdock, RN, BSN;Susanne Bice, RN, BSN, Lance Sell, BA, ACSM CEP, Exercise Physiologist   Supervising physician immediately available to respond to emergencies [name] immediately available ER MD   Physician(s) Dr. [name] Balboa and Dr. Clearnce Hasten   Medication changes reported     No   Fall or balance concerns reported    No   Warm-up and Cool-down Performed as group-led instruction   Resistance Training Performed No   VAD Patient? No     Pain Assessment   Currently in Pain? No/denies         Goals Met:  Proper associated with RPD/PD & O2 Sat Exercise tolerated well  Goals Unmet:  Not Applicable  Comments:     Dr. Emily Filbert is Medical Director for [name] and [name].

## 2016-10-30 ENCOUNTER — Encounter: Payer: Non-veteran care | Admitting: *Deleted

## 2016-10-30 DIAGNOSIS — J449 Chronic obstructive pulmonary disease, unspecified: Secondary | ICD-10-CM | POA: Diagnosis not present

## 2016-10-30 NOTE — Progress Notes (Signed)
Daily Session Note  Patient Details  Name: Darius Norris MRN: [id number] Date of Birth: 1951-03-18 Referring Provider:   Arn Medal Row Documentation from 09/06/2016 in [facility] Cardiac and Pulmonary Rehab  Referring Provider  [facility]      Encounter Date: 10/30/2016  Check In:     Session Check In - 10/30/16 1117      Check-In   Staff Present Gerlene Burdock, RN, Moises Blood, BS, ACSM CEP, Exercise Physiologist;Amanda Oletta Darter, [place], ACSM CEP, Exercise Physiologist   Supervising physician immediately available to respond to emergencies [name] immediately available ER MD   Physician(s) Cinda Quest and Joni Fears   Medication changes reported     No   Fall or balance concerns reported    No   Warm-up and Cool-down Performed on first and last piece of equipment   Resistance Training Performed Yes   VAD Patient? No     Pain Assessment   Currently in Pain? No/denies   Multiple Pain Sites No         Goals Met:  Proper associated with RPD/PD & O2 Sat Independence with exercise equipment Exercise tolerated well Strength training completed today  Goals Unmet:  Not Applicable  Comments: Pt able to follow exercise prescription today without complaint.  Will continue to monitor for progression.    Dr. Emily Filbert is Medical Director for [place] and [name].

## 2016-11-03 ENCOUNTER — Encounter: Payer: Non-veteran care | Admitting: *Deleted

## 2016-11-03 DIAGNOSIS — J449 Chronic obstructive pulmonary disease, unspecified: Secondary | ICD-10-CM | POA: Diagnosis not present

## 2016-11-03 NOTE — Progress Notes (Signed)
Daily Session Note  Patient Details  Name: Darius Norris MRN: [id number] Date of Birth: 01-24-1951 Referring Provider:   Arn Medal Row Documentation from 09/06/2016 in [facility] Cardiac and Pulmonary Rehab  Referring Provider  [facility]      Encounter Date: 11/03/2016  Check In:     Session Check In - 11/03/16 1051      Check-In   Location [facility]-Cardiac & Pulmonary Rehab   Staff Present Nyoka Cowden, RN, BSN, MA;Imanol Bihl, RN, Vickki Hearing, BA, ACSM CEP, Exercise Physiologist   Supervising physician immediately available to respond to emergencies [name] immediately available ER MD   Physician(s) Dr. Jimmye Norman and Dr. Jacqualine Code   Medication changes reported     No   Fall or balance concerns reported    No   Warm-up and Cool-down Performed as group-led instruction   Resistance Training Performed Yes   VAD Patient? No     Pain Assessment   Currently in Pain? No/denies         Goals Met:  Proper associated with RPD/PD & O2 Sat Exercise tolerated well  Goals Unmet:  Not Applicable  Comments:     Dr. Emily Filbert is Medical Director for [place] and [name].

## 2016-11-06 ENCOUNTER — Encounter: Payer: Non-veteran care | Admitting: *Deleted

## 2016-11-06 DIAGNOSIS — J449 Chronic obstructive pulmonary disease, unspecified: Secondary | ICD-10-CM

## 2016-11-06 NOTE — Progress Notes (Signed)
Daily Session Note  Patient Details  Name: Darius Norris MRN: [id number] Date of Birth: 12-10-1951 Referring Provider:   Arn Medal Row Documentation from 09/06/2016 in [facility]  Referring Provider  [facility]      Encounter Date: 11/06/2016  Check In:     Session Check In - 11/06/16 1143      Check-In   Location [facility]   Staff Present Carson Myrtle, BS, RRT, Respiratory Bertis Ruddy, BS, ACSM CEP, Exercise Physiologist;Amanda Oletta Darter, BA, ACSM CEP, Exercise Physiologist   Supervising physician immediately available to respond to emergencies [name] immediately available ER MD   Physician(s) Drs. Malinda and Kinner   Medication changes reported     No   Fall or balance concerns reported    No   Warm-up and Cool-down Performed on first and last piece of equipment   Resistance Training Performed Yes   VAD Patient? No     Pain Assessment   Currently in Pain? No/denies   Multiple Pain Sites No         Goals Met:  Proper associated with RPD/PD & O2 Sat Independence with exercise equipment Exercise tolerated well Personal goals reviewed Strength training completed today  Goals Unmet:  Not Applicable  Comments: Pt able to follow exercise prescription today without complaint.  Will continue to monitor for progression.    Dr. Emily Filbert is Medical Director for [facility] and [name].

## 2016-11-13 ENCOUNTER — Encounter: Payer: Self-pay | Admitting: Respiratory Therapy

## 2016-11-13 DIAGNOSIS — J449 Chronic obstructive pulmonary disease, unspecified: Secondary | ICD-10-CM

## 2016-11-13 NOTE — Progress Notes (Signed)
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 disease risk factors and the goals associated with each risk factor.   Aerobic Exercise & Resistance Training: - Gives group verbal and written discussion on the health impact of inactivity. On the components of aerobic and resistive training programs and the benefits of this training and how to safely progress through these programs. Flowsheet Row Pulmonary Rehab from 04/21/2015 in [place]  Date  04/21/15  Educator  [place]  Instruction Review Code  2- meets goals/outcomes      Flexibility, Balance, General Exercise Guidelines: - Provides group verbal and written instruction on the benefits of flexibility and balance training programs. Provides general exercise guidelines with specific guidelines to those with heart or lung disease. Demonstration and skill practice provided. Flowsheet Row Pulmonary Rehab from 04/21/2015 in [place]  Date  04/21/15  Educator  [place]  Instruction Review Code  2- meets goals/outcomes      Stress Management: - Provides group verbal and written instruction about the health risks of elevated stress, cause of high stress, and healthy ways to reduce stress.   Depression: - Provides group verbal and written instruction on the correlation between heart/lung disease and depressed mood, treatment options, and the stigmas associated with seeking treatment.   Exercise & Equipment Safety: - Individual verbal instruction and demonstration of equipment use and safety with use of the equipment. Flowsheet Row Pulmonary Rehab from 07/17/2016 in [facility] Cardiac and Pulmonary Rehab  Date  07/17/16  Educator  [facility]  Instruction Review Code  2- meets goals/outcomes      Infection Prevention: - Provides verbal and written material to individual with discussion of infection control including proper hand washing and proper equipment cleaning during exercise session. Flowsheet Row Pulmonary Rehab from 07/17/2016 in [facility] Cardiac and Pulmonary Rehab  Date  07/17/16  Educator  [facility]  Instruction Review Code  2- meets goals/outcomes      Falls Prevention: - Provides verbal and written material to individual with discussion of falls prevention and safety. Flowsheet Row Pulmonary Rehab from 07/17/2016 in [facility] Cardiac and Pulmonary Rehab  Date  06/27/16  Educator  LB  Instruction Review Code  2- meets goals/outcomes      Diabetes: - Individual verbal and written instruction to review signs/symptoms of diabetes, desired ranges of glucose level fasting, after meals and with exercise. Advice that pre and post exercise glucose checks will be done for 3 sessions at entry of program.   Chronic Lung Diseases: - Group verbal and written instruction to review new  updates, new respiratory medications, new advancements in procedures and treatments. Provide informative websites  and "800" numbers of self-education. Flowsheet Row Pulmonary Rehab from 04/26/2015 in [place]  Date  04/26/15  Educator  LB  Instruction Review Code  2- meets goals/outcomes      Lung Procedures: - Group verbal and written instruction to describe testing methods done to diagnose lung disease. Review the outcome of test results. Describe the treatment choices: Pulmonary Function Tests, ABGs and oximetry. Flowsheet Row Pulmonary Rehab from 04/30/2015 in [place]  Date  04/30/15  Educator  [place]  Instruction Review Code  2- meets goals/outcomes      Energy Conservation: - Provide group verbal and written instruction for methods to conserve energy, plan and organize activities. Instruct on pacing techniques, use of adaptive equipment and posture/positioning to relieve shortness of breath. Flowsheet Row Pulmonary Rehab from 05/24/2015 in [place]  Date  05/12/15  Educator  SW  Instruction Review Code  2- meets goals/outcomes      Triggers: - Group verbal and written instruction to review types of environmental controls: home humidity, furnaces, filters, dust mite/pet prevention, HEPA vacuums. To discuss weather changes, air quality and the benefits of nasal washing.   Exacerbations: - Group verbal and written instruction to provide: warning signs, infection symptoms, calling MD promptly, preventive modes, and value of vaccinations. Review: effective airway clearance, coughing and/or vibration techniques. Create an Sports administrator. Flowsheet Row Pulmonary Rehab from 05/24/2015 in [place]  Date  05/24/15  Educator  [facility]  Instruction Review Code  2- meets goals/outcomes      Oxygen: - Individual and group verbal and written instruction on oxygen therapy. Includes supplement oxygen, available portable oxygen systems, continuous and  intermittent flow rates, oxygen safety, concentrators, and Medicare reimbursement for oxygen. Flowsheet Row Pulmonary Rehab from 07/17/2016 in [facility] Cardiac and Pulmonary Rehab  Date  06/27/16  Educator  LB  Instruction Review Code  2- meets goals/outcomes      Respiratory Medications: - Group verbal and written instruction to review medications for lung disease. Drug class, frequency, complications, importance of spacers, rinsing mouth after steroid MDI's, and proper cleaning methods for nebulizers. Flowsheet Row Pulmonary Rehab from 07/17/2016 in [facility] Cardiac and Pulmonary Rehab  Date  06/27/16  Educator  LB  Instruction Review Code  2- meets goals/outcomes      AED/CPR: - Group verbal and written instruction with the use of models to demonstrate the basic use of the AED with the basic ABC's of resuscitation.   Breathing Retraining: - Provides individuals verbal and written instruction on purpose, frequency, and proper technique of diaphragmatic breathing and pursed-lipped breathing. Applies individual practice skills. Flowsheet Row Pulmonary Rehab from 07/17/2016 in [facility] Cardiac and Pulmonary Rehab  Date  07/17/16  Educator  [facility]  Instruction Review Code  2- meets goals/outcomes      Anatomy and Physiology of the Lungs: - Group verbal and written instruction with the use of models to provide basic lung anatomy and physiology related to function, structure and complications of lung disease.   Heart Failure: - Group verbal and written instruction on the basics of heart failure: signs/symptoms, treatments, explanation of ejection fraction, enlarged heart and cardiomyopathy.   Sleep Apnea: - Individual verbal and written instruction to review Obstructive Sleep Apnea. Review of risk factors, methods for diagnosing and types of masks and machines for OSA.   Anxiety: -  Provides group, verbal and written instruction on the correlation between heart/lung disease and anxiety, treatment  options, and management of anxiety.   Relaxation: - Provides group, verbal and written instruction about the benefits of relaxation for patients with heart/lung disease. Also provides patients with examples of relaxation techniques. Flowsheet Row Pulmonary Rehab from 05/05/2015 in [place]  Date  05/05/15  Educator  Lucianne Lei LCSW  Instruction Review Code  2- Meets goals/outcomes      Knowledge Questionnaire Score:     Knowledge Questionnaire Score - 06/27/16 1129      Knowledge Questionnaire Score   Pre Score 8/10       Core Components/Risk Factors/Patient Goals at Admission:     Personal Goals and Risk Factors at Admission - 06/27/16 1136      Core Components/Risk Factors/Patient Goals on Admission    Weight Management Yes;Weight Loss   Intervention Weight Management: Develop a combined nutrition and exercise program designed to reach desired caloric intake, while maintaining appropriate intake of nutrient and fiber, sodium and fats, and appropriate energy expenditure required for the weight goal.;Weight Management: Provide education and appropriate resources to help participant work on and attain dietary goals.   Admit Weight 204 lb 6.4 oz (92.7 kg)   Goal Weight: Short Term 200 lb (90.7 kg)   Goal Weight: Long Term 150 lb (68 kg)   Expected Outcomes Short Term: Continue to assess and modify interventions until short term weight is achieved   Sedentary Yes   Intervention Provide advice, education, support and counseling about physical activity/exercise needs.;Develop an individualized exercise prescription for aerobic and resistive training based on initial evaluation findings, risk stratification, comorbidities and participant's personal goals.  Home stationary exercise bike   Expected Outcomes Achievement of increased cardiorespiratory fitness and enhanced flexibility, muscular endurance and strength shown through measurements of  functional capacity and personal statement of participant.   Increase Strength and Stamina Yes   Intervention Provide advice, education, support and counseling about physical activity/exercise needs.;Develop an individualized exercise prescription for aerobic and resistive training based on initial evaluation findings, risk stratification, comorbidities and participant's personal goals.   Expected Outcomes Achievement of increased cardiorespiratory fitness and enhanced flexibility, muscular endurance and strength shown through measurements of functional capacity and personal statement of participant.   Improve shortness of breath with ADL's Yes   Intervention Provide education, individualized exercise plan and daily activity instruction to help decrease symptoms of SOB with activities of daily living.   Expected Outcomes Short Term: Achieves a reduction of symptoms when performing activities of daily living.   Develop more efficient breathing techniques such as purse lipped breathing and diaphragmatic breathing; and practicing self-pacing with activity Yes   Intervention Provide education, demonstration and support about specific breathing techniuqes utilized for more efficient breathing. Include techniques such as pursed lipped breathing, diaphragmatic breathing and self-pacing activity.   Expected Outcomes Short Term: Participant will be able to demonstrate and use breathing techniques as needed throughout daily activities.   Increase knowledge of respiratory medications and ability to use respiratory devices properly  Yes   Intervention Provide education and demonstration as needed of appropriate use of medications, inhalers, and oxygen therapy.  Oxygen 2l/m [place]; MDI's: Combivent Respimat, Symbicort; Svn: Albuterol; Uses a spacer   Expected Outcomes Short Term: Achieves understanding of medications use. Understands that oxygen is a medication prescribed by physician. Demonstrates appropriate use of  inhaler and oxygen therapy.   Hypertension Yes   Intervention Provide education  on lifestyle modifcations including regular physical activity/exercise, weight management, moderate sodium restriction and increased consumption of fresh fruit, vegetables, and low fat dairy, alcohol moderation, and smoking cessation.;Monitor prescription use compliance.   Expected Outcomes Short Term: Continued assessment and intervention until BP is < 140/29m HG in hypertensive participants. < 130/868mHG in hypertensive participants with diabetes, heart failure or chronic kidney disease.;Long Term: Maintenance of blood pressure at goal levels.      Core Components/Risk Factors/Patient Goals Review:      Goals and Risk Factor Review    Row Name 07/17/16 1126 07/21/16 1215 08/02/16 1158 09/14/16 0957 09/18/16 1501     Core Components/Risk Factors/Patient Goals Review   Personal Goals Review Develop more efficient breathing techniques such as purse lipped breathing and diaphragmatic breathing and practicing self-pacing with activity. Weight Management/Obesity;Hypertension Increase knowledge of respiratory medications and ability to use respiratory devices properly.;Improve shortness of breath with ADL's;Sedentary;Develop more efficient breathing techniques such as purse lipped breathing and diaphragmatic breathing and practicing self-pacing with activity. Improve shortness of breath with ADL's;Increase Strength and Stamina;Increase knowledge of respiratory medications and ability to use respiratory devices properly.;Develop more efficient breathing techniques such as purse lipped breathing and diaphragmatic breathing and practicing self-pacing with activity. Increase knowledge of respiratory medications and ability to use respiratory devices properly.   Review Discussed pursed lip breathing techniques with patient. He stated that he is comfortable using pursed lip breathing and uses it in his daily activities to help  control SOB.  Discussed the two risk factors with MiRonalee Beltsoday. He has goal od weight down to 200 lbs short term. At 204 lbs now, Was 210 not to long ago.  He stated that he still eats a variey of foods fried and grilled.  He has deferred an RD appointment. I di remind hiom that the RD is available if he does not see weight change as desired, so he can review his nutrition plan. He is walking at home as well as coming to the prgoram. He is new to the program and expects to see change as he comes to the sessions. Hypertension is controlled at present time with medications nd the exercise he is doing in the program and at home.  He stated he is aware to watch for the amount of sodium intake per day.  Mr RiSheerinoticed a difference walking into [name] today - easier and less shortness of breath. He is walking around the house more and plans to start using his stationary bike at home. He has good technique with his PLB. Mr [place] [facility] [facility] [place] off due to an COPD excerbation. He had his SVN changed to Duoneb and was educated on the 2 medicine [place] the [place] has a good understanding of his inhalers and oxygen. PLB is a daily therapy for him with activity. He has improved his exercise goals and has more stamina for activities at home and walking. Mr RiSmolinskys complaining of increased sputum production after his exacerbation which was treated with 2 antibiotics and 2 rounds of predisone. Educated him on increasing his water intake to 8 classes a day to liquidfy his sputum and showed him a flutter valve. He has an appointment with [facility] pulmon[facility] ask about it.    Expected Outcomes Patient will continue to use pursed lip breathing during his exercise sessions in order to be able to continue with progression of workloads to increase strengh and stamina.  Weight loss goal to 200 lbs met prior to discharge. RD inout if no changes noted midway through program.   COntinued compliance with medications , continued exercise for blood pressure control maintenance  --  --  --   Row Name 09/27/16 1540 10/11/16 0713 10/11/16 0716 11/06/16 1247       Core Components/Risk Factors/Patient Goals Review   Personal Goals Review Increase knowledge of respiratory medications and ability to use respiratory devices properly. Develop more efficient breathing techniques such as purse lipped breathing and diaphragmatic breathing and practicing self-pacing with activity.;Sedentary;Improve shortness of breath with ADL's;Increase knowledge of respiratory medications and ability to use respiratory devices properly.;Increase Strength and Stamina Weight Management/Obesity;Hypertension Sedentary;Weight Management/Obesity;Develop more efficient breathing techniques such as purse lipped breathing and diaphragmatic breathing and practicing self-pacing with activity.    Review Mr Haugen received from the [place] a new flutter valve called the VibraPep. He is using it twice a day and has increased his sputum production.  -- Mr Karie Soda is progressing in [place] with his exercise goals. He has a stationary bike at home, although he has not used it during [employer]. His blood pressure readings are acceptable and his weight is consistent throughout the program. He has a good understanding of his MDI's and is very compliant the schedule. Due to a consistent cough as of late, he has added Mucinex which has helped him expectorate. A recent CT of the lungs confirmed clear lungs. PLB is a daily technique used to manage his COPD, as well as his oxygen. He is noticing improvement in his stamina and does attend regularly. [name] states his weight has stayed consistent at around 204 lb.  He has a bike at home that he plans to use in addition to walking.  His low back pain sometimes inhibits his walking but he states he can rest and then continue.  He is "up and down" all day.  He has good and bad days breathing.  He  does use PLB when short of breath.    Expected Outcomes Continue using flutter valve for secretion mobilization.  -- Continue attended [name] for improvement in his exercise capacity and management skills for COPD. [name] will continue to be active and maintain his weight , and keep his stamina up by being active throughout the day.       Core Components/Risk Factors/Patient Goals at Discharge (Final Review):      Goals and Risk Factor Review - 11/06/16 1247      Core Components/Risk Factors/Patient Goals Review   Personal Goals Review Sedentary;Weight Management/Obesity;Develop more efficient breathing techniques such as purse lipped breathing and diaphragmatic breathing and practicing self-pacing with activity.   Review [name] states his weight has stayed consistent at around 204 lb.  He has a bike at home that he plans to use in addition to walking.  His low back pain sometimes inhibits his walking but he states he can rest and then continue.  He is "up and down" all day.  He has good and bad days breathing.  He does use PLB when short of breath.   Expected Outcomes [name] will continue to be active and maintain his weight , and keep his stamina up by being active throughout the day.      ITP Comments:     ITP Comments    Row Name 08/30/16 1031 09/06/16 1419 09/13/16 1301       ITP Comments Mr. Yvon Mccord called to say he was sorry that he can't be  in [place] today since he has a cold. He hopes to return on Monday.  [name] called to let us know that he had been out sick.  He was admitted to hospital for observation over night with increased shortness of breath.  He hopes to be able to return to rehab on Monday.  [name] is progressing well with exercise.        Comments: 30 day note review

## 2016-11-15 DIAGNOSIS — J449 Chronic obstructive pulmonary disease, unspecified: Secondary | ICD-10-CM

## 2016-11-15 NOTE — Progress Notes (Signed)
Daily Session Note  Patient Details  Name: Darius Norris MRN: [id number] Date of Birth: September 10, 1951 Referring Provider:   Arn Medal Row Documentation from 09/06/2016 in [facility] Cardiac and Pulmonary Rehab  Referring Provider  [facility]      Encounter Date: 11/15/2016  Check In:     Session Check In - 11/15/16 1214      Check-In   Location [facility]-Cardiac & Pulmonary Rehab   Staff Present Carson Myrtle, BS, RRT, Respiratory Therapist;Carroll Enterkin, RN, Vickki Hearing, BA, ACSM CEP, Exercise Physiologist   Supervising physician immediately available to respond to emergencies [name] immediately available ER MD   Physician(s) Jimmye Norman and Reita Cliche   Medication changes reported     No   Fall or balance concerns reported    No   Warm-up and Cool-down Performed as group-led Location manager Performed Yes   VAD Patient? No     Pain Assessment   Currently in Pain? No/denies         Goals Met:  Proper associated with RPD/PD & O2 Sat Independence with exercise equipment Exercise tolerated well Strength training completed today  Goals Unmet:  Not Applicable  Comments: Pt able to follow exercise prescription today without complaint.  Will continue to monitor for progression.    Dr. Emily Filbert is Medical Director for [place] and [name]monary Rehabilitation.

## 2016-11-17 ENCOUNTER — Encounter: Payer: Non-veteran care | Attending: Internal Medicine | Admitting: *Deleted

## 2016-11-17 DIAGNOSIS — J449 Chronic obstructive pulmonary disease, unspecified: Secondary | ICD-10-CM | POA: Insufficient documentation

## 2016-11-17 NOTE — Progress Notes (Signed)
Daily Session Note  Patient Details  Name: Darius Norris MRN: [id number] Date of Birth: May 11, 1951 Referring Provider:   Arn Medal Row Documentation from 09/06/2016 in [facility] Cardiac and Pulmonary Rehab  Referring Provider  [facility]      Encounter Date: 11/17/2016  Check In:     Session Check In - 11/17/16 1040      Check-In   Location [facility]-Cardiac & Pulmonary Rehab   Staff Present Gerlene Burdock, RN, Vickki Hearing, BA, ACSM CEP, Exercise Physiologist;Other  Levell July, RN BSN   Supervising physician immediately available to respond to emergencies [name] immediately available ER MD   Physician(s) Drs. Alfred Levins and [employer]   Medication changes reported     No   Fall or balance concerns reported    No   Warm-up and Cool-down Performed as group-led Location manager Performed Yes   VAD Patient? No     Pain Assessment   Currently in Pain? No/denies   Multiple Pain Sites No         Goals Met:  Proper associated with RPD/PD & O2 Sat Independence with exercise equipment Using PLB without cueing & demonstrates good technique Exercise tolerated well No report of cardiac concerns or symptoms Strength training completed today  Goals Unmet:  Not Applicable  Comments: Pt able to follow exercise prescription today without complaint.  Will continue to monitor for progression.    Dr. Emily Filbert is Medical Director for [place] and [name].

## 2016-11-20 ENCOUNTER — Encounter: Payer: Non-veteran care | Admitting: *Deleted

## 2016-11-20 DIAGNOSIS — J449 Chronic obstructive pulmonary disease, unspecified: Secondary | ICD-10-CM

## 2016-11-20 NOTE — Progress Notes (Signed)
Daily Session Note  Patient Details  Name: Darius Norris MRN: [id number] Date of Birth: 1951-11-08 Referring Provider:   Arn Medal Row Documentation from 09/06/2016 in [facility] Cardiac and Pulmonary Rehab  Referring Provider  [facility]      Encounter Date: 11/20/2016  Check In:     Session Check In - 11/20/16 1203      Check-In   Location [facility]-Cardiac & Pulmonary Rehab   Staff Present Carson Myrtle, BS, RRT, Respiratory Therapist;Sarath Privott Amedeo Plenty, BS, ACSM CEP, Exercise Physiologist;Amanda Oletta Darter, BA, ACSM CEP, Exercise Physiologist   Supervising physician immediately available to respond to emergencies [name] immediately available ER MD   Physician(s) Jimmye Norman and Mariea Clonts   Medication changes reported     No   Fall or balance concerns reported    No   Warm-up and Cool-down Performed on first and last piece of equipment   Resistance Training Performed Yes   VAD Patient? No     Pain Assessment   Currently in Pain? No/denies   Multiple Pain Sites No         Goals Met:  Proper associated with RPD/PD & O2 Sat Independence with exercise equipment Exercise tolerated well Strength training completed today  Goals Unmet:  Not Applicable  Comments: Pt able to follow exercise prescription today without complaint.  Will continue to monitor for progression.    Dr. Emily Filbert is Medical Director for [place] and [name].

## 2016-11-24 VITALS — Ht 66.0 in | Wt 202.0 lb

## 2016-11-24 DIAGNOSIS — J449 Chronic obstructive pulmonary disease, unspecified: Secondary | ICD-10-CM

## 2016-11-24 NOTE — Progress Notes (Signed)
Daily Session Note  Patient Details  Name: Darius Norris MRN: [id number] Date of Birth: December 15, 1951 Referring Provider:   Arn Medal Row Documentation from 09/06/2016 in [facility] Cardiac and Pulmonary Rehab  Referring Provider  [facility]      Encounter Date: 11/24/2016  Check In:     Session Check In - 11/24/16 1021      Check-In   Location [facility]-Cardiac & Pulmonary Rehab   Staff Present Nada Maclachlan, BA, ACSM CEP, Exercise Physiologist;Susanne Bice, RN, BSN, CCRP;Other  Levell July, RN   Supervising physician immediately available to respond to emergencies [name] immediately available ER MD   Physician(s) Drs. Quale and Paduchowski   Medication changes reported     No   Fall or balance concerns reported    No   Warm-up and Cool-down Performed as group-led Location manager Performed Yes   VAD Patient? No     Pain Assessment   Currently in Pain? No/denies   Multiple Pain Sites No         Goals Met:  Proper associated with RPD/PD & O2 Sat Independence with exercise equipment Using PLB without cueing & demonstrates good technique No report of cardiac concerns or symptoms Strength training completed today  Goals Unmet:  Not Applicable  Comments: Pt able to follow exercise prescription today without complaint.  Will continue to monitor for progression.    Dr. Emily Filbert is Medical Director for [facility] and [name].

## 2016-12-04 ENCOUNTER — Encounter: Payer: Non-veteran care | Admitting: *Deleted

## 2016-12-04 DIAGNOSIS — J449 Chronic obstructive pulmonary disease, unspecified: Secondary | ICD-10-CM | POA: Diagnosis not present

## 2016-12-04 NOTE — Progress Notes (Signed)
Daily Session Note  Patient Details  Name: PASCO MARCHITTO MRN: [id number] Date of Birth: 03-25-1951 Referring Provider:   Arn Medal Row Documentation from 09/06/2016 in [facility] Cardiac and Pulmonary Rehab  Referring Provider  [facility]      Encounter Date: 12/04/2016  Check In:     Session Check In - 12/04/16 1046      Check-In   Location [facility]-Cardiac & Pulmonary Rehab   Staff Present Carson Myrtle, BS, RRT, Respiratory Therapist;Graylon Amory Amedeo Plenty, BS, ACSM CEP, Exercise Physiologist;Amanda Oletta Darter, BA, ACSM CEP, Exercise Physiologist;Mary Kellie Shropshire, RN, BSN, MA   Supervising physician immediately available to respond to emergencies [name] immediately available ER MD   Physician(s) Dominic Pea and Paduchowski   Medication changes reported     No   Fall or balance concerns reported    No   Warm-up and Cool-down Performed on first and last piece of equipment   Resistance Training Performed Yes   VAD Patient? No     Pain Assessment   Currently in Pain? No/denies   Multiple Pain Sites No         Goals Met:  Proper associated with RPD/PD & O2 Sat Independence with exercise equipment Exercise tolerated well Strength training completed today  Goals Unmet:  Not Applicable  Comments: Pt able to follow exercise prescription today without complaint.  Will continue to monitor for progression.    Dr. Emily Filbert is Medical Director for [facility] and [name].

## 2016-12-05 NOTE — Patient Instructions (Signed)
Discharge Instructions  Patient Details  Name: Darius Norris MRN: [id number] Date of Birth: [birth year] Referring Provider:  Joes   Number of Visits: 56 Reason for Discharge:  Patient reached a stable level of exercise. Patient independent in their exercise.  Smoking History:  History  Smoking Status   Former Smoker  Smokeless Tobacco   Never Used    Diagnosis:  Moderate COPD (chronic obstructive pulmonary disease) ([place])  Initial Exercise Prescription:     Initial Exercise Prescription - 09/06/16 1400      Date of Initial Exercise RX and Referring Provider   Date 07/17/16   Referring Provider [facility]      Discharge Exercise Prescription (Final Exercise Prescription Changes):     Exercise Prescription Changes - 12/05/16 0600      Exercise Review   Progression Yes     Response to Exercise   Blood Pressure (Admit) 132/84  Exercise goals and vital signs on 12/04/16   Blood Pressure (Exercise) 128/68   Blood Pressure (Exit) 112/60   Heart Rate (Admit) 78 bpm   Heart Rate (Exercise) 78 bpm   Heart Rate (Exit) 67 bpm   Oxygen Saturation (Admit) 98 %  2l/m   Oxygen Saturation (Exercise) 99 %   Oxygen Saturation (Exit) 97 %   Rating of Perceived Exertion (Exercise) 12   Perceived Dyspnea (Exercise) 3   Comments none   Duration Progress to 45 minutes of aerobic exercise without signs/symptoms of physical distress   Intensity THRR unchanged     Progression   Progression Continue to progress workloads to maintain intensity without signs/symptoms of physical distress.     Resistance Training   Training Prescription Yes   Weight 3   Reps 10-12     Interval Training   Interval Training Yes     Oxygen   Oxygen Continuous   Liters 2     Recumbant Bike   Level 3   Minutes 15   METs 2.5     REL-XR   Level 4   Minutes 15   METs 2.4     Biostep-RELP   Level 3   Minutes 15   METs 2     Home Exercise Plan   Plans to  continue exercise at [employer] (comment)  [place] 2 additional days to program exercise sessions.      Functional Capacity:     6 Minute Walk    Row Name 06/27/16 1034 09/20/16 1053 11/24/16 1107     6 Minute Walk   Phase  -- Mid Program Discharge   Distance 500 feet 888 feet 670 feet   Distance % Change  -- 77 %  --   Walk Time 4 minutes 5.58 minutes 4.9 minutes   # of Rest Breaks  -- 1 2   MPH 1.42 1.8 1.5   METS 1.6 2.48 2.03   RPE 15 20 16    Perceived Dyspnea  4 7 4    VO2 Peak 5.5 8.7 7.11   Symptoms Yes (comment) Yes (comment) Yes (comment)   Comments low back pain more than breathing low back pain "twinge" at end - has had in past - fine with usual seated exercise   Resting HR 69 bpm 102 bpm 75 bpm   Resting BP 138/74 138/68 128/70   Max Ex. HR 87 bpm 118 bpm 97 bpm   Max Ex. BP 152/72 146/64 164/72     Interval HR  Baseline HR  -- 2 75   1 Minute HR  -- 111 88   2 Minute HR  -- 111  --   3 Minute HR  -- 115 86   4 Minute HR  --  -- 95   5 Minute HR  -- 115 97   6 Minute HR  -- 118 86   Interval Heart Rate?  -- Yes Yes     Interval Oxygen   Interval Oxygen?  -- Yes Yes   Baseline Oxygen Saturation %  -- 96 % 92 %   Baseline Liters of Oxygen  -- 2 L 2 L   1 Minute Oxygen Saturation %  -- 91 % 93 %   1 Minute Liters of Oxygen  -- 2 L 2 L   2 Minute Oxygen Saturation %  -- 90 %  --   2 Minute Liters of Oxygen  -- 2 L 2 L   3 Minute Oxygen Saturation %  -- 90 % 91 %   3 Minute Liters of Oxygen  -- 2 L 2 L   4 Minute Oxygen Saturation %  --  -- 93 %   4 Minute Liters of Oxygen  -- 2 L 2 L   5 Minute Oxygen Saturation %  -- 91 % 92 %   5 Minute Liters of Oxygen  -- 2 L 2 L   6 Minute Oxygen Saturation %  -- 89 % 89 %   6 Minute Liters of Oxygen  -- 2 L 2 L   2 Minute Post Liters of Oxygen  -- 2 L 2 L      Quality of Life:     Quality of Life - 11/20/16 1514      Quality of Life Scores   Health/Function Pre 19.5 %    Health/Function Post 19.81 %   Health/Function % Change 1.59 %   Socioeconomic Pre 19.17 %   Socioeconomic Post 19.29 %   Socioeconomic % Change  0.63 %   Psych/Spiritual Pre 19 %   Psych/Spiritual Post 19.71 %   Psych/Spiritual % Change 3.74 %   Family Pre 20.6 %   Family Post 20 %   Family % Change -2.91 %   GLOBAL Pre 19.5 %   GLOBAL Post 19.71 %   GLOBAL % Change 1.08 %      Personal Goals: Goals established at orientation with interventions provided to work toward goal.     Personal Goals and Risk Factors at Admission - 06/27/16 1136      Core Components/Risk Factors/Patient Goals on Admission    Weight Management Yes;Weight Loss   Intervention Weight Management: Develop a combined nutrition and exercise program designed to reach desired caloric intake, while maintaining appropriate intake of nutrient and fiber, sodium and fats, and appropriate energy expenditure required for the weight goal.;Weight Management: Provide education and appropriate resources to help participant work on and attain dietary goals.   Admit Weight 204 lb 6.4 oz (92.7 kg)   Goal Weight: Short Term 200 lb (90.7 kg)   Goal Weight: Long Term 150 lb (68 kg)   Expected Outcomes Short Term: Continue to assess and modify interventions until short term weight is achieved   Sedentary Yes   Intervention Provide advice, education, support and counseling about physical activity/exercise needs.;Develop an individualized exercise prescription for aerobic and resistive training based on initial evaluation findings, risk stratification, comorbidities and participant's personal goals.  Home stationary exercise bike   Expected  Outcomes Achievement of increased cardiorespiratory fitness and enhanced flexibility, muscular endurance and strength shown through measurements of functional capacity and personal statement of participant.   Increase Strength and Stamina Yes   Intervention Provide advice, education, support and  counseling about physical activity/exercise needs.;Develop an individualized exercise prescription for aerobic and resistive training based on initial evaluation findings, risk stratification, comorbidities and participant's personal goals.   Expected Outcomes Achievement of increased cardiorespiratory fitness and enhanced flexibility, muscular endurance and strength shown through measurements of functional capacity and personal statement of participant.   Improve shortness of breath with ADL's Yes   Intervention Provide education, individualized exercise plan and daily activity instruction to help decrease symptoms of SOB with activities of daily living.   Expected Outcomes Short Term: Achieves a reduction of symptoms when performing activities of daily living.   Develop more efficient breathing techniques such as purse lipped breathing and diaphragmatic breathing; and practicing self-pacing with activity Yes   Intervention Provide education, demonstration and support about specific breathing techniuqes utilized for more efficient breathing. Include techniques such as pursed lipped breathing, diaphragmatic breathing and self-pacing activity.   Expected Outcomes Short Term: Participant will be able to demonstrate and use breathing techniques as needed throughout daily activities.   Increase knowledge of respiratory medications and ability to use respiratory devices properly  Yes   Intervention Provide education and demonstration as needed of appropriate use of medications, inhalers, and oxygen therapy.  Oxygen 2l/m [place]; MDI's: Combivent Respimat, Symbicort; Svn: Albuterol; Uses a spacer   Expected Outcomes Short Term: Achieves understanding of medications use. Understands that oxygen is a medication prescribed by physician. Demonstrates appropriate use of inhaler and oxygen therapy.   Hypertension Yes   Intervention Provide education on lifestyle modifcations including regular physical activity/exercise,  weight management, moderate sodium restriction and increased consumption of fresh fruit, vegetables, and low fat dairy, alcohol moderation, and smoking cessation.;Monitor prescription use compliance.   Expected Outcomes Short Term: Continued assessment and intervention until BP is < 140/53mm HG in hypertensive participants. < 130/57mm HG in hypertensive participants with diabetes, heart failure or chronic kidney disease.;Long Term: Maintenance of blood pressure at goal levels.       Personal Goals Discharge:     Goals and Risk Factor Review - 12/05/16 0645      Core Components/Risk Factors/Patient Goals Review   Personal Goals Review Sedentary;Weight Management/Obesity;Increase Strength and Stamina;Improve shortness of breath with ADL's;Increase knowledge of respiratory medications and ability to use respiratory devices properly.;Develop more efficient breathing techniques such as purse lipped breathing and diaphragmatic breathing and practicing self-pacing with activity.;Hypertension   Review Darius Norris will be graduating this week. He has improved his stamina and strength and has a good understanding of the importance of exercise. He will continue exercise at the [facility]. His post 63mwd improved 157ft - Minimal Importance Difference for COPD is 98.69ft. He has a good understanding of his inhalers and recently acquired a flutter valve which he uses with increased sputum production. Darius Norris has lost 6 lbs during the program. He is eatting less bread and smaller portion sizes.  Using PLB and pacing, Darius Norris manages his shortness of breath. He is compliant with his blood pressure medication and has had acceptable BP in [name].   Expected Outcomes Continue managing his COPD with exercise, knowledge from [place], use of inhaler's, good diet, and his 2l/m of oxygen.      Nutrition & Weight - Outcomes:     Pre Biometrics - 06/27/16 1034  Pre Biometrics   Height 5\' 6"  (1.676 m)    Weight 204 lb 6.4 oz (92.7 kg)   Waist Circumference 49 inches   Hip Circumference 48 inches   Waist to Hip Ratio 1.02 %   BMI (Calculated) 33.1         Post Biometrics - 11/24/16 1107       Post  Biometrics   Height 5\' 6"  (1.676 m)   Weight 202 lb (91.6 kg)   Waist Circumference 48.5 inches   Hip Circumference 46.25 inches   Waist to Hip Ratio 1.05 %   BMI (Calculated) 32.7      Nutrition:   Nutrition Discharge:   Education Questionnaire Score:     Knowledge Questionnaire Score - 11/20/16 1512      Knowledge Questionnaire Score   Post Score 9/10      Goals reviewed with patient; copy given to patient.

## 2016-12-05 NOTE — Progress Notes (Signed)
Discharge Summary  Patient Details  Name: Darius Norris MRN: [id number] Date of Birth: 1951-04-12 Referring Provider:   Arn Medal Row Documentation from 09/06/2016 in [facility] Card[facility]  Referring Provider  [facility]       Number of Visits: 36  Reason for Discharge:  Patient reached a stable level of exercise. Patient independent in their exercise.  Smoking History:  History  Smoking Status   Former Smoker  Smokeless Tobacco   Never Used    Diagnosis:  Moderate COPD (chronic obstructive pulmonary disease) ([place])  ADL [facility]:     Pulmonary Assessment Scores    Row Name 06/27/16 1131 09/20/16 1000 11/20/16 1513     ADL [facility]   ADL Phase Entry Mid Exit   SOB Score total 50 77 60   Rest 1 1 0   Walk 3 3 2    Stairs 2 4 4    Bath 4 3 3    Dress 2 2 1    Shop 2 3 3       Initial Exercise Prescription:     Initial Exercise Prescription - 09/06/16 1400      Date of Initial Exercise RX and Referring Provider   Date 07/17/16   Referring Provider [facility]      Discharge Exercise Prescription (Final Exercise Prescription Changes):     Exercise Prescription Changes - 12/05/16 0600      Exercise Review   Progression Yes     Response to Exercise   Blood Pressure (Admit) 132/84  Exercise goals and vital signs on 12/04/16   Blood Pressure (Exercise) 128/68   Blood Pressure (Exit) 112/60   Heart Rate (Admit) 78 bpm   Heart Rate (Exercise) 78 bpm   Heart Rate (Exit) 67 bpm   Oxygen Saturation (Admit) 98 %  2l/m   Oxygen Saturation (Exercise) 99 %   Oxygen Saturation (Exit) 97 %   Rating of Perceived Exertion (Exercise) 12   Perceived Dyspnea (Exercise) 3   Comments none   Duration Progress to 45 minutes of aerobic exercise without signs/symptoms of physical distress   Intensity THRR unchanged     Progression   Progression Continue to progress workloads to maintain intensity without signs/symptoms of physical distress.      Resistance Training   Training Prescription Yes   Weight 3   Reps 10-12     Interval Training   Interval Training Yes     Oxygen   Oxygen Continuous   Liters 2     Recumbant Bike   Level 3   Minutes 15   METs 2.5     REL-XR   Level 4   Minutes 15   METs 2.4     Biostep-RELP   Level 3   Minutes 15   METs 2     Home Exercise Plan   Plans to continue exercise at [employer] (comment)  [place] 2 additional days to program exercise sessions.      Functional Capacity:     6 Minute Walk    Row Name 06/27/16 1034 09/20/16 1053 11/24/16 1107     6 Minute Walk   Phase  -- Mid Program Discharge   Distance 500 feet 888 feet 670 feet   Distance % Change  -- 77 %  --   Walk Time 4 minutes 5.58 minutes 4.9 minutes   # of Rest Breaks  -- 1 2   MPH 1.42 1.8 1.5   METS  1.6 2.48 2.03   RPE 15 20 16    Perceived Dyspnea  4 7 4    VO2 Peak 5.5 8.7 7.11   Symptoms Yes (comment) Yes (comment) Yes (comment)   Comments low back pain more than breathing low back pain "twinge" at end - has had in past - fine with usual seated exercise   Resting HR 69 bpm 102 bpm 75 bpm   Resting BP 138/74 138/68 128/70   Max Ex. HR 87 bpm 118 bpm 97 bpm   Max Ex. BP 152/72 146/64 164/72     Interval HR   Baseline HR  -- 2 75   1 Minute HR  -- 111 88   2 Minute HR  -- 111  --   3 Minute HR  -- 115 86   4 Minute HR  --  -- 95   5 Minute HR  -- 115 97   6 Minute HR  -- 118 86   Interval Heart Rate?  -- Yes Yes     Interval Oxygen   Interval Oxygen?  -- Yes Yes   Baseline Oxygen Saturation %  -- 96 % 92 %   Baseline Liters of Oxygen  -- 2 L 2 L   1 Minute Oxygen Saturation %  -- 91 % 93 %   1 Minute Liters of Oxygen  -- 2 L 2 L   2 Minute Oxygen Saturation %  -- 90 %  --   2 Minute Liters of Oxygen  -- 2 L 2 L   3 Minute Oxygen Saturation %  -- 90 % 91 %   3 Minute Liters of Oxygen  -- 2 L 2 L   4 Minute Oxygen Saturation %  --  -- 93 %   4 Minute Liters of  Oxygen  -- 2 L 2 L   5 Minute Oxygen Saturation %  -- 91 % 92 %   5 Minute Liters of Oxygen  -- 2 L 2 L   6 Minute Oxygen Saturation %  -- 89 % 89 %   6 Minute Liters of Oxygen  -- 2 L 2 L   2 Minute Post Liters of Oxygen  -- 2 L 2 L      Psychological, QOL, Others - Outcomes: PHQ 2/9: Depression screen [facility] 2/9 11/20/2016 06/27/2016  Decreased Interest 1 0  Down, Depressed, Hopeless 1 0  PHQ - 2 Score 2 0  Altered sleeping 1 1  Tired, decreased energy 2 2  Change in appetite 1 1  Feeling bad or failure about yourself  0 0  Trouble concentrating 0 0  Moving slowly or fidgety/restless 0 0  Suicidal thoughts 0 0  PHQ-9 Score 6 4  Difficult doing work/chores Very difficult Somewhat difficult    Quality of Life:     Quality of Life - 11/20/16 1514      Quality of Life Scores   Health/Function Pre 19.5 %   Health/Function Post 19.81 %   Health/Function % Change 1.59 %   Socioeconomic Pre 19.17 %   Socioeconomic Post 19.29 %   Socioeconomic % Change  0.63 %   Psych/Spiritual Pre 19 %   Psych/Spiritual Post 19.71 %   Psych/Spiritual % Change 3.74 %   Family Pre 20.6 %   Family Post 20 %   Family % Change -2.91 %   GLOBAL Pre 19.5 %   GLOBAL Post 19.71 %   GLOBAL % Change 1.08 %  Personal Goals: Goals established at orientation with interventions provided to work toward goal.     Personal Goals and Risk Factors at Admission - 06/27/16 1136      Core Components/Risk Factors/Patient Goals on Admission    Weight Management Yes;Weight Loss   Intervention Weight Management: Develop a combined nutrition and exercise program designed to reach desired caloric intake, while maintaining appropriate intake of nutrient and fiber, sodium and fats, and appropriate energy expenditure required for the weight goal.;Weight Management: Provide education and appropriate resources to help participant work on and attain dietary goals.   Admit Weight 204 lb 6.4 oz (92.7 kg)   Goal  Weight: Short Term 200 lb (90.7 kg)   Goal Weight: Long Term 150 lb (68 kg)   Expected Outcomes Short Term: Continue to assess and modify interventions until short term weight is achieved   Sedentary Yes   Intervention Provide advice, education, support and counseling about physical activity/exercise needs.;Develop an individualized exercise prescription for aerobic and resistive training based on initial evaluation findings, risk stratification, comorbidities and participant's personal goals.  Home stationary exercise bike   Expected Outcomes Achievement of increased cardiorespiratory fitness and enhanced flexibility, muscular endurance and strength shown through measurements of functional capacity and personal statement of participant.   Increase Strength and Stamina Yes   Intervention Provide advice, education, support and counseling about physical activity/exercise needs.;Develop an individualized exercise prescription for aerobic and resistive training based on initial evaluation findings, risk stratification, comorbidities and participant's personal goals.   Expected Outcomes Achievement of increased cardiorespiratory fitness and enhanced flexibility, muscular endurance and strength shown through measurements of functional capacity and personal statement of participant.   Improve shortness of breath with ADL's Yes   Intervention Provide education, individualized exercise plan and daily activity instruction to help decrease symptoms of SOB with activities of daily living.   Expected Outcomes Short Term: Achieves a reduction of symptoms when performing activities of daily living.   Develop more efficient breathing techniques such as purse lipped breathing and diaphragmatic breathing; and practicing self-pacing with activity Yes   Intervention Provide education, demonstration and support about specific breathing techniuqes utilized for more efficient breathing. Include techniques such as pursed  lipped breathing, diaphragmatic breathing and self-pacing activity.   Expected Outcomes Short Term: Participant will be able to demonstrate and use breathing techniques as needed throughout daily activities.   Increase knowledge of respiratory medications and ability to use respiratory devices properly  Yes   Intervention Provide education and demonstration as needed of appropriate use of medications, inhalers, and oxygen therapy.  Oxygen 2l/m ; MDI's: Combivent Respimat, Symbicort; Svn: Albuterol; Uses a spacer   Expected Outcomes Short Term: Achieves understanding of medications use. Understands that oxygen is a medication prescribed by physician. Demonstrates appropriate use of inhaler and oxygen therapy.   Hypertension Yes   Intervention Provide education on lifestyle modifcations including regular physical activity/exercise, weight management, moderate sodium restriction and increased consumption of fresh fruit, vegetables, and low fat dairy, alcohol moderation, and smoking cessation.;Monitor prescription use compliance.   Expected Outcomes Short Term: Continued assessment and intervention until BP is < 140/82m HG in hypertensive participants. < 130/817mHG in hypertensive participants with diabetes, heart failure or chronic kidney disease.;Long Term: Maintenance of blood pressure at goal levels.       Personal Goals Discharge:     Goals and Risk Factor Review    Row Name 07/17/16 1126 07/21/16 1215 08/02/16 1158 09/14/16 0957 09/18/16 1501     Core  Components/Risk Factors/Patient Goals Review   Personal Goals Review Develop more efficient breathing techniques such as purse lipped breathing and diaphragmatic breathing and practicing self-pacing with activity. Weight Management/Obesity;Hypertension Increase knowledge of respiratory medications and ability to use respiratory devices properly.;Improve shortness of breath with ADL's;Sedentary;Develop more efficient breathing techniques such as  purse lipped breathing and diaphragmatic breathing and practicing self-pacing with activity. Improve shortness of breath with ADL's;Increase Strength and Stamina;Increase knowledge of respiratory medications and ability to use respiratory devices properly.;Develop more efficient breathing techniques such as purse lipped breathing and diaphragmatic breathing and practicing self-pacing with activity. Increase knowledge of respiratory medications and ability to use respiratory devices properly.   Review Discussed pursed lip breathing techniques with patient. He stated that he is comfortable using pursed lip breathing and uses it in his daily activities to help control SOB.  Discussed the two risk factors with [name] today. He has goal od weight down to 200 lbs short term. At 204 lbs now, Was 210 not to long ago.  He stated that he still eats a variey of foods fried and grilled.  He has deferred an RD appointment. I di remind hiom that the RD is available if he does not see weight change as desired, so he can review his nutrition plan. He is walking at home as well as coming to the prgoram. He is new to the program and expects to see change as he comes to the sessions. Hypertension is controlled at present time with medications nd the exercise he is doing in the program and at home.  He stated he is aware to watch for the amount of sodium intake per day.  Mr Bur noticed a difference walking into [name] today - easier and less shortness of breath. He is walking around the house more and plans to start using his stationary bike at home. He has good technique with his PLB. Mr Kahrs has returned to [place] with 2 weeks off due to an COPD excerbation. He had his SVN changed to Duoneb and was educated on the 2 medicine in the [place]. He has a good understanding of his inhalers and oxygen. PLB is a daily therapy for him with activity. He has improved his exercise goals and has more stamina for activities at home and  walking. Mr Cheramie is complaining of increased sputum production after his exacerbation which was treated with 2 antibiotics and 2 rounds of predisone. Educated him on increasing his water intake to 8 classes a day to liquidfy his sputum and showed him a flutter valve. He has an appointment with his pulmonologist this Thursday at the [place] and will ask about it.    Expected Outcomes Patient will continue to use pursed lip breathing during his exercise sessions in order to be able to continue with progression of workloads to increase strengh and stamina.  Weight loss goal to 200 lbs met prior to discharge. RD inout if no changes noted midway through program.  COntinued compliance with medications , continued exercise for blood pressure control maintenance  --  --  --   Row Name 09/27/16 1540 10/11/16 0713 10/11/16 0716 11/06/16 1247 12/05/16 0645     Core Components/Risk Factors/Patient Goals Review   Personal Goals Review Increase knowledge of respiratory medications and ability to use respiratory devices properly. Develop more efficient breathing techniques such as purse lipped breathing and diaphragmatic breathing and practicing self-pacing with activity.;Sedentary;Improve shortness of breath with ADL's;Increase knowledge of respiratory medications and ability to use respiratory devices  properly.;Increase Strength and Stamina Weight Management/Obesity;Hypertension Sedentary;Weight Management/Obesity;Develop more efficient breathing techniques such as purse lipped breathing and diaphragmatic breathing and practicing self-pacing with activity. Sedentary;Weight Management/Obesity;Increase Strength and Stamina;Improve shortness of breath with ADL's;Increase knowledge of respiratory medications and ability to use respiratory devices properly.;Develop more efficient breathing techniques such as purse lipped breathing and diaphragmatic breathing and practicing self-pacing with activity.;Hypertension   Review Mr  Key received from the [place] a new flutter valve called the VibraPep. He is using it twice a day and has increased his sputum production.  -- Mr Karie Soda is progressing in [place] with his exercise goals. He has a stationary bike at home, although he has not used it during [employer]. His blood pressure readings are acceptable and his weight is consistent throughout the program. He has a good understanding of his MDI's and is very compliant the schedule. Due to a consistent cough as of late, he has added Mucinex which has helped him expectorate. A recent CT of the lungs confirmed clear lungs. PLB is a daily technique used to manage his COPD, as well as his oxygen. He is noticing improvement in his stamina and does attend regularly. [name] states his weight has stayed consistent at around 204 lb.  He has a bike at home that he plans to use in addition to walking.  His low back pain sometimes inhibits his walking but he states he can rest and then continue.  He is "up and down" all day.  He has good and bad days breathing.  He does use PLB when short of breath. Mr Willert will be graduating this week. He has improved his stamina and strength and has a good understanding of the importance of exercise. He will continue exercise at the [facility]. His post 31md improved 175f- Minimal Importance Difference for COPD is [id number] has a good understanding of his inhalers and recently acquired a flutter valve which he uses with increased sputum production. Mr RitMorkens lost 6 lbs during the program. He is eatting less bread and smaller portion sizes.  Using PLB and pacing, Mr Ouderkirk manages his shortness of breath. He is compliant with his blood pressure medication and has had acceptable BP in [name].   Expected Outcomes Continue using flutter valve for secretion mobilization.  -- Continue attended [name] for improvement in his exercise capacity and management skills for COPD. MikRonalee Beltsll continue to be active and  maintain his weight , and keep his stamina up by being active throughout the day. Continue managing his COPD with exercise, knowledge from [facility] of inhaler's, good diet, and his 2l/m of oxygen.      Nutrition & Weight - Outcomes:     Pre Biometrics - 06/27/16 1034      Pre Biometrics   Height 5' 6"  (1.676 m)   Weight 204 lb 6.4 oz (92.7 kg)   Waist Circumference 49 inches   Hip Circumference 48 inches   Waist to Hip Ratio 1.02 %   BMI (Calculated) 33.1         Post Biometrics - 11/24/16 1107       Post  Biometrics   Height 5' 6"  (1.676 m)   Weight 202 lb (91.6 kg)   Waist Circumference 48.5 inches   Hip Circumference 46.25 inches   Waist to Hip Ratio 1.05 %   BMI (Calculated) 32.7      Nutrition:   Nutrition Discharge:   Education Questionnaire Score:     Knowledge Questionnaire Score -  11/20/16 1512      Knowledge Questionnaire Score   Post Score 9/10      Goals reviewed with patient; copy given to patient.

## 2016-12-06 DIAGNOSIS — J449 Chronic obstructive pulmonary disease, unspecified: Secondary | ICD-10-CM | POA: Diagnosis not present

## 2016-12-06 NOTE — Progress Notes (Signed)
Daily Session Note  Patient Details  Name: Darius Norris MRN: [id number] Date of Birth: [birth year] Referring Provider:   Arn Medal Row Documentation from 09/06/2016 in [facility] Card[facility] Pulmonary Re[facility]  Referring Provider  [facility]      Encounter Date: 12/06/2016  Check In:     Session Check In - 12/06/16 1103      Check-In   Location [facility]-Cardiac & Pulmonary Rehab   Staff Present Carson Myrtle, BS, RRT, Respiratory Dareen Piano, BA, ACSM CEP, Exercise Physiologist;Jessica Luan Pulling, MA, ACSM RCEP, Exercise Physiologist   Supervising physician immediately available to respond to emergencies [name] immediately available ER MD   Physician(s) Clearnce Hasten and Alfred Levins   Medication changes reported     No   Fall or balance concerns reported    No   Warm-up and Cool-down Performed as group-led Location manager Performed Yes   VAD Patient? No     Pain Assessment   Currently in Pain? No/denies         Goals Met:  Proper associated with RPD/PD & O2 Sat Independence with exercise equipment Exercise tolerated well Strength training completed today  Goals Unmet:  Not Applicable  Comments: Pt able to follow exercise prescription today without complaint.  Will continue to monitor for progression.    Dr. Emily Filbert is Medical Director for [place] and [name] Rehabilitation.

## 2016-12-08 ENCOUNTER — Encounter: Payer: Non-veteran care | Admitting: *Deleted

## 2016-12-08 DIAGNOSIS — J449 Chronic obstructive pulmonary disease, unspecified: Secondary | ICD-10-CM

## 2016-12-08 NOTE — Progress Notes (Addendum)
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 pleased about that.  His appetite is good and he is sleeping better with use of the Melatonin that was suggested.  He reports breathing somewhat better as well.  Mr. Elvera Lennox continues to report stress with concern over his wife and her need for knee replacement surgery (both knees eventually) and neither has good insurance.  He reports she is continuing to work and comes home exhausted and in a lot of pain.  Mr. Elvera Lennox copes with exercise and trying to stay positive.  He continues to have some coughing/throat issues since the last bout with bronchitis a few months ago; and plans to mention that to the Dr. at next visit.  Counselor commended Mr. R for his progress made and his commitment to consistently exercise.        Education: Education Goals: Education classes will be provided on a weekly basis, covering required topics. Participant will state understanding/return demonstration of topics presented.  Learning Barriers/Preferences:     Learning Barriers/Preferences - 06/27/16 1128      Learning  Barriers/Preferences   Learning Barriers None   Learning Preferences Group Instruction;Individual Instruction;Pictoral;Skilled Demonstration;Verbal Instruction;Video;Written Material      Education Topics: Initial Evaluation Education: - Verbal, written and demonstration of respiratory meds, RPE/PD scales, oximetry and breathing techniques. Instruction on use of nebulizers and MDIs: cleaning and proper use, rinsing mouth with steroid doses and importance of monitoring MDI activations. Flowsheet Row Pulmonary Rehab from 11/17/2016 in [facility] Cardiac and Pulmonary Rehab  Date  07/17/16  Educator  [facility]  Instruction Review Code  2- meets goals/outcomes      General Nutrition Guidelines/Fats and Fiber: -Group instruction provided by verbal, written material, models and posters to present the general guidelines for heart healthy nutrition. Gives an explanation and review of dietary fats and fiber.   Controlling Sodium/Reading Food Labels: -Group verbal and written material supporting the discussion of sodium use in heart healthy nutrition. Review and explanation with models, verbal and written materials for utilization of the food label.   Exercise Physiology & Risk Factors: - Group verbal and written instruction with models to review the exercise physiology of the cardiovascular system and associated critical values. Details cardiovascular disease risk factors and the goals associated with each risk factor.   Aerobic Exercise & Resistance Training: - Gives group verbal and written discussion on the health impact of inactivity. On the components of aerobic and resistive training programs and the benefits of this training and how to safely progress through these programs. Flowsheet Row Pulmonary Rehab from 04/21/2015 in  [place] [facility]  Date  04/21/15  Educator  [address]  Instruction Review Code  2- meets goals/outcomes      Flexibility, Balance, General  Exercise Guidelines: - Provides group verbal and written instruction on the benefits of flexibility and balance training programs. Provides general exercise guidelines with specific guidelines to those with heart or lung disease. Demonstration and skill practice provided. Flowsheet Row Pulmonary Rehab from 04/21/2015 in [place]  Date  04/21/15  Educator  [place]  Instruction Review Code  2- meets goals/outcomes      Stress Management: - Provides group verbal and written instruction about the health risks of elevated stress, cause of high stress, and healthy ways to reduce stress.   Depression: - Provides group verbal and written instruction on the correlation between heart/lung disease and depressed mood, treatment options, and the stigmas associated with seeking treatment.   Exercise & Equipment Safety: - Individual verbal instruction and demonstration of equipment use and safety with use of the equipment. Flowsheet Row Pulmonary Rehab from 11/17/2016 in [facility] Cardiac and Pulmonary Rehab  Date  07/17/16  Educator  [facility]  Instruction Review Code  2- meets goals/outcomes      Infection Prevention: - Provides verbal and written material to individual with discussion of infection control including proper hand washing and proper equipment cleaning during exercise session. Flowsheet Row Pulmonary Rehab from 11/17/2016 in [facility] Cardiac and Pulmonary Rehab  Date  07/17/16  Educator  [facility]  Instruction Review Code  2- meets goals/outcomes      Falls Prevention: - Provides verbal and written material to individual with discussion of falls prevention and safety. Flowsheet Row Pulmonary Rehab from 11/17/2016 in [facility] Cardiac and Pulmonary Rehab  Date  06/27/16  Educator  LB  Instruction Review Code  2- meets goals/outcomes      Diabetes: - Individual verbal and written instruction to review signs/symptoms of diabetes, desired ranges of glucose level  fasting, after meals and with exercise. Advice that pre and post exercise glucose checks will be done for 3 sessions at entry of program.   Chronic Lung Diseases: - Group verbal and written instruction to review new updates, new respiratory medications, new advancements in procedures and treatments. Provide informative websites and "800" numbers of self-education. Flowsheet Row Pulmonary Rehab from 04/26/2015 in [place]  Date  04/26/15  Educator  LB  Instruction Review Code  2- meets goals/outcomes      Lung Procedures: - Group verbal and written instruction to describe testing methods done to diagnose lung disease. Review the outcome of test results. Describe the treatment choices: Pulmonary Function Tests, ABGs and oximetry. Flowsheet Row Pulmonary Rehab from 04/30/2015 in [place]  Date  04/30/15  Educator  [place]  Instruction Review Code  2- meets goals/outcomes      Energy Conservation: - Provide group verbal and written instruction for methods to conserve energy, plan and organize activities. Instruct on pacing techniques, use of adaptive equipment and posture/positioning to relieve shortness of breath. Flowsheet Row Pulmonary Rehab from 05/24/2015 in [place]  Date  05/12/15  Educator  SW  Instruction Review Code  2- meets goals/outcomes      Triggers: - Group verbal and written instruction to review types of environmental controls: home humidity, furnaces, filters, dust mite/pet prevention, HEPA vacuums. To discuss weather changes, air quality and the benefits of nasal washing.  Exacerbations: - Group verbal and written instruction to provide: warning signs, infection symptoms, calling MD promptly, preventive modes, and value of vaccinations. Review: effective airway clearance, coughing and/or vibration techniques. Create an Sports administrator. Flowsheet Row Pulmonary  Rehab from 05/24/2015 in [place]  Date  05/24/15  Educator  L[facility]  Instruction Review Code  2- meets goals/outcomes      Oxygen: - Individual and group verbal and written instruction on oxygen therapy. Includes supplement oxygen, available portable oxygen systems, continuous and intermittent flow rates, oxygen safety, concentrators, and Medicare reimbursement for oxygen. Flowsheet Row Pulmonary Rehab from 11/17/2016 in [facility] Cardiac and Pulmonary Rehab  Date  06/27/16  Educator  LB  Instruction Review Code  2- meets goals/outcomes      Respiratory Medications: - Group verbal and written instruction to review medications for lung disease. Drug class, frequency, complications, importance of spacers, rinsing mouth after steroid MDI's, and proper cleaning methods for nebulizers. Flowsheet Row Pulmonary Rehab from 11/17/2016 in [facility] Cardiac and Pulmonary Rehab  Date  06/27/16  Educator  LB  Instruction Review Code  2- meets goals/outcomes      AED/CPR: - Group verbal and written instruction with the use of models to demonstrate the basic use of the AED with the basic ABC's of resuscitation.   Breathing Retraining: - Provides individuals verbal and written instruction on purpose, frequency, and proper technique of diaphragmatic breathing and pursed-lipped breathing. Applies individual practice skills. Flowsheet Row Pulmonary Rehab from 11/17/2016 in [facility] Cardiac and Pulmonary Rehab  Date  07/17/16  Educator  [facility]  Instruction Review Code  2- meets goals/outcomes      Anatomy and Physiology of the Lungs: - Group verbal and written instruction with the use of models to provide basic lung anatomy and physiology related to function, structure and complications of lung disease.   Heart Failure: - Group verbal and written instruction on the basics of heart failure: signs/symptoms, treatments, explanation of ejection fraction, enlarged heart and  cardiomyopathy.   Sleep Apnea: - Individual verbal and written instruction to review Obstructive Sleep Apnea. Review of risk factors, methods for diagnosing and types of masks and machines for OSA.   Anxiety: - Provides group, verbal and written instruction on the correlation between heart/lung disease and anxiety, treatment options, and management of anxiety.   Relaxation: - Provides group, verbal and written instruction about the benefits of relaxation for patients with heart/lung disease. Also provides patients with examples of relaxation techniques. Flowsheet Row Pulmonary Rehab from 05/05/2015 in [place]  Date  05/05/15  Educator  Lucianne Lei LCSW  Instruction Review Code  2- Meets goals/outcomes      Knowledge Questionnaire Score:     Knowledge Questionnaire Score - 11/20/16 1512      Knowledge Questionnaire Score   Post Score 9/10       Core Components/Risk Factors/Patient Goals at Admission:     Personal Goals and Risk Factors at Admission - 06/27/16 1136      Core Components/Risk Factors/Patient Goals on Admission    Weight Management Yes;Weight Loss   Intervention Weight Management: Develop a combined nutrition and exercise program designed to reach desired caloric intake, while maintaining appropriate intake of nutrient and fiber, sodium and fats, and appropriate energy expenditure required for the weight goal.;Weight Management: Provide education and appropriate resources to help participant work on and attain dietary goals.   Admit Weight 204 lb 6.4 oz (92.7 kg)   Goal Weight:  Short Term 200 lb (90.7 kg)   Goal Weight: Long Term 150 lb (68 kg)   Expected Outcomes Short Term: Continue to assess and modify interventions until short term weight is achieved   Sedentary Yes   Intervention Provide advice, education, support and counseling about physical activity/exercise needs.;Develop an individualized exercise prescription  for aerobic and resistive training based on initial evaluation findings, risk stratification, comorbidities and participant's personal goals.  Home stationary exercise bike   Expected Outcomes Achievement of increased cardiorespiratory fitness and enhanced flexibility, muscular endurance and strength shown through measurements of functional capacity and personal statement of participant.   Increase Strength and Stamina Yes   Intervention Provide advice, education, support and counseling about physical activity/exercise needs.;Develop an individualized exercise prescription for aerobic and resistive training based on initial evaluation findings, risk stratification, comorbidities and participant's personal goals.   Expected Outcomes Achievement of increased cardiorespiratory fitness and enhanced flexibility, muscular endurance and strength shown through measurements of functional capacity and personal statement of participant.   Improve shortness of breath with ADL's Yes   Intervention Provide education, individualized exercise plan and daily activity instruction to help decrease symptoms of SOB with activities of daily living.   Expected Outcomes Short Term: Achieves a reduction of symptoms when performing activities of daily living.   Develop more efficient breathing techniques such as purse lipped breathing and diaphragmatic breathing; and practicing self-pacing with activity Yes   Intervention Provide education, demonstration and support about specific breathing techniuqes utilized for more efficient breathing. Include techniques such as pursed lipped breathing, diaphragmatic breathing and self-pacing activity.   Expected Outcomes Short Term: Participant will be able to demonstrate and use breathing techniques as needed throughout daily activities.   Increase knowledge of respiratory medications and ability to use respiratory devices properly  Yes   Intervention Provide education and demonstration as  needed of appropriate use of medications, inhalers, and oxygen therapy.  Oxygen 2l/m [place]; MDI's: Combivent Respimat, Symbicort; Svn: Albuterol; Uses a spacer   Expected Outcomes Short Term: Achieves understanding of medications use. Understands that oxygen is a medication prescribed by physician. Demonstrates appropriate use of inhaler and oxygen therapy.   Hypertension Yes   Intervention Provide education on lifestyle modifcations including regular physical activity/exercise, weight management, moderate sodium restriction and increased consumption of fresh fruit, vegetables, and low fat dairy, alcohol moderation, and smoking cessation.;Monitor prescription use compliance.   Expected Outcomes Short Term: Continued assessment and intervention until BP is < 140/52m HG in hypertensive participants. < 130/836mHG in hypertensive participants with diabetes, heart failure or chronic kidney disease.;Long Term: Maintenance of blood pressure at goal levels.      Core Components/Risk Factors/Patient Goals Review:      Goals and Risk Factor Review    Row Name 07/17/16 1126 07/21/16 1215 08/02/16 1158 09/14/16 0957 09/18/16 1501     Core Components/Risk Factors/Patient Goals Review   Personal Goals Review Develop more efficient breathing techniques such as purse lipped breathing and diaphragmatic breathing and practicing self-pacing with activity. Weight Management/Obesity;Hypertension Increase knowledge of respiratory medications and ability to use respiratory devices properly.;Improve shortness of breath with ADL's;Sedentary;Develop more efficient breathing techniques such as purse lipped breathing and diaphragmatic breathing and practicing self-pacing with activity. Improve shortness of breath with ADL's;Increase Strength and Stamina;Increase knowledge of respiratory medications and ability to use respiratory devices properly.;Develop more efficient breathing techniques such as purse lipped breathing and  diaphragmatic breathing and practicing self-pacing with activity. Increase knowledge of respiratory medications and ability to use  respiratory devices properly.   Review Discussed pursed lip breathing techniques with patient. He stated that he is comfortable using pursed lip breathing and uses it in his daily activities to help control SOB.  Discussed the two risk factors with Kathlene November today. He has goal od weight down to 200 lbs short term. At 204 lbs now, Was 210 not to long ago.  He stated that he still eats a variey of foods fried and grilled.  He has deferred an RD appointment. I di remind hiom that the RD is available if he does not see weight change as desired, so he can review his nutrition plan. He is walking at home as well as coming to the prgoram. He is new to the program and expects to see change as he comes to the sessions. Hypertension is controlled at present time with medications nd the exercise he is doing in the program and at home.  He stated he is aware to watch for the amount of sodium intake per day.  Mr Fertig noticed a difference walking into [name] today - easier and less shortness of breath. He is walking around the house more and plans to start using his stationary bike at home. He has good technique with his PLB. Mr Kraynak has returned to [name] with 2 weeks off due to an COPD excerbation. He had his SVN changed to [facility] and was educated on the 2 medicine in the [place]. He has a good understanding of his inhalers and oxygen. PLB is a daily therapy for him with activity. He has improved his exercise goals and has more stamina for activities at home and walking. Mr Huesman is complaining of increased sputum production after his exacerbation which was treated with 2 antibiotics and 2 rounds of predisone. Educated him on increasing his water intake to 8 classes a day to liquidfy his sputum and showed him a flutter valve. He has an appointment with his pulmonologist this Thursday at the  [place] and will ask about it.    Expected Outcomes Patient will continue to use pursed lip breathing during his exercise sessions in order to be able to continue with progression of workloads to increase strengh and stamina.  Weight loss goal to 200 lbs met prior to discharge. RD inout if no changes noted midway through program.  COntinued compliance with medications , continued exercise for blood pressure control maintenance  -  -  -   Row Name 09/27/16 1540 10/11/16 0713 10/11/16 0716 11/06/16 1247 12/05/16 0645     Core Components/Risk Factors/Patient Goals Review   Personal Goals Review Increase knowledge of respiratory medications and ability to use respiratory devices properly. Develop more efficient breathing techniques such as purse lipped breathing and diaphragmatic breathing and practicing self-pacing with activity.;Sedentary;Improve shortness of breath with ADL's;Increase knowledge of respiratory medications and ability to use respiratory devices properly.;Increase Strength and Stamina Weight Management/Obesity;Hypertension Sedentary;Weight Management/Obesity;Develop more efficient breathing techniques such as purse lipped breathing and diaphragmatic breathing and practicing self-pacing with activity. Sedentary;Weight Management/Obesity;Increase Strength and Stamina;Improve shortness of breath with ADL's;Increase knowledge of respiratory medications and ability to use respiratory devices properly.;Develop more efficient breathing techniques such as purse lipped breathing and diaphragmatic breathing and practicing self-pacing with activity.;Hypertension   Review Mr Kober received from the [place] a new flutter valve called the VibraPep. He is using it twice a day and has increased his sputum production.  - Mr Anthoney Harada is progressing in [name] with his exercise goals. He has a stationary bike at  home, although he has not used it during [employer]. His blood pressure readings are acceptable and his weight is  consistent throughout the program. He has a good understanding of his MDI's and is very compliant the schedule. Due to a consistent cough as of late, he has added Mucinex which has helped him expectorate. A recent CT of the lungs confirmed clear lungs. PLB is a daily technique used to manage his COPD, as well as his oxygen. He is noticing improvement in his stamina and does attend regularly. [name] states his weight has stayed consistent at around 204 lb.  He has a bike at home that he plans to use in addition to walking.  His low back pain sometimes inhibits his walking but he states he can rest and then continue.  He is "up and down" all day.  He has good and bad days breathing.  He does use PLB when short of breath. Mr Sahli will be graduating this week. He has improved his stamina and strength and has a good understanding of the importance of exercise. He will continue exercise at the [facility]. His post 78md improved [id number]- Minimal Importance Difference for COPD is [id number] has a good understanding of his inhalers and recently acquired a flutter valve which he uses with increased sputum production. Mr RitLindroths lost 6 lbs during the program. He is eatting less bread and smaller portion sizes.  Using PLB and pacing, Mr Norell manages his shortness of breath. He is compliant with his blood pressure medication and has had acceptable BP in [name].   Expected Outcomes Continue using flutter valve for secretion mobilization.  - Continue attended [name] for improvement in his exercise capacity and management skills for COPD. MikRonalee Beltsll continue to be active and maintain his weight , and keep his stamina up by being active throughout the day. Continue managing his COPD with exercise, knowledge [place] [facility] [place] inhaler's, good diet, and his 2l/m of oxygen.      Core Components/Risk Factors/Patient Goals at Discharge (Final Review):      Goals and Risk Factor Review - 12/05/16 0645       Core Components/Risk Factors/Patient Goals Review   Personal Goals Review Sedentary;Weight Management/Obesity;Increase Strength and Stamina;Improve shortness of breath with ADL's;Increase knowledge of respiratory medications and ability to use respiratory devices properly.;Develop more efficient breathing techniques such as purse lipped breathing and diaphragmatic breathing and practicing self-pacing with activity.;Hypertension   Review Mr RitKlopfll be graduating this week. He has improved his stamina and strength and has a good understanding of the importance of exercise. [facility] [id number] Importance Difference for COPD is 98.4ft. 77fhas a good understanding of his inhalers and recently acquired a flutter valve which he uses with increased sputum production. Mr RitchiTownost 6 lbs during the program. He is eatting less bread and smaller portion sizes.  Using PLB and pacing, Mr Mon[place] manages his shortness of breath. He is compliant with his blood pressure medication and has had acceptable BP in [name].   Expected Outcomes Continue managing his COPD with exercise, knowledge [facility], good diet, and his 2l/m of oxygen.      ITP Comments:     ITP Comments    Row Name 08/30/16 1031 09/06/16 1419 09/13/16 1301 11/17/16 1103     ITP Comments Mr. Cristhian Serafino Burciagad to say he was sorry that he can't  be in [facility] today since he has a cold. He hopes to return on Monday.  [name] called to let us know that he had been out sick.  He was admitted to hospital for observation over night with increased shortness of breath.  He hopes to be able to return to rehab on Monday.  [name] is progressing well with exercise. "Know Your Numbers" education was completed today with learning goals met.        Comments: discharge ITP

## 2016-12-08 NOTE — Progress Notes (Signed)
Daily Session Note  Patient Details  Name: Darius Norris MRN: [id number] Date of Birth: Oct 31, 1951 Referring Provider:   Arn Medal Row Documentation from 09/06/2016 in [facility]  Referring Provider  [facility]      Encounter Date: 12/08/2016  Check In:     Session Check In - 12/08/16 1053      Check-In   Location [facility]-Cardiac & Pulmonary Rehab   Staff Present Gerlene Burdock, RN, Vickki Hearing, BA, ACSM CEP, Exercise Physiologist;Patricia Surles RN BSN   Supervising physician immediately available to respond to emergencies [name] immediately available ER MD   Physician(s) Dr. Joni Fears and Dr. Reita Cliche   Medication changes reported     No   Fall or balance concerns reported    No   Warm-up and Cool-down Performed as group-led instruction   Resistance Training Performed Yes   VAD Patient? No     Pain Assessment   Currently in Pain? No/denies           Exercise Prescription Changes - 12/07/16 1100      Exercise Review   Progression Yes     Response to Exercise   Blood Pressure (Admit) 100/58   Blood Pressure (Exercise) 156/64   Blood Pressure (Exit) 108/54   Heart Rate (Admit) 74 bpm   Heart Rate (Exercise) 73 bpm   Heart Rate (Exit) 66 bpm   Oxygen Saturation (Admit) 95 %   Oxygen Saturation (Exercise) 96 %   Oxygen Saturation (Exit) 97 %   Rating of Perceived Exertion (Exercise) 12   Perceived Dyspnea (Exercise) 3   Comments none   Duration Progress to 45 minutes of aerobic exercise without signs/symptoms of physical distress   Intensity THRR unchanged     Progression   Progression Continue to progress workloads to maintain intensity without signs/symptoms of physical distress.     Resistance Training   Training Prescription Yes   Weight 3   Reps 10-12     Interval Training   Interval Training Yes     Oxygen   Oxygen Continuous   Liters 2     Recumbant Bike   Level 2   Minutes 15   METs 2.6     REL-XR   Level 4   Minutes 15   METs 2.2     Biostep-RELP   Level 3   Minutes 15   METs 2      Goals Met:  Proper associated with RPD/PD & O2 Sat Exercise tolerated well  Goals Unmet:  Not Applicable  Comments:     Dr. Emily Filbert is Medical Director for [name] and [name].

## 2016-12-08 NOTE — Progress Notes (Signed)
Discharge Summary  Patient Details  Name: Darius Norris MRN: [id number] Date of Birth: [birth year] Referring Provider:   Arn Medal Row Documentation from 09/06/2016 in [facility] Cardiac and Pulmonary Rehab  Referring Provider  [facility]       Number of Visits: 36  Reason for Discharge:  Patient reached a stable level of exercise. Patient independent in their exercise.  Smoking History:  History  Smoking Status  . Former Smoker  Smokeless Tobacco  . Never Used    Diagnosis:  Moderate COPD (chronic obstructive pulmonary disease) ([place])  ADL [facility]:     Pulmonary Assessment Scores    Row Name 06/27/16 1131 09/20/16 1000 11/20/16 1513     ADL [facility]   ADL Phase Entry Mid Exit   SOB Score total 50 77 60   Rest 1 1 0   Walk _0 Stairs _1 Bath _2 Dress _3 Shop _4 Initial Exercise Prescription:     Initial Exercise Prescription - 09/06/16 1400      Date of Initial Exercise RX and Referring Provider   Date 07/17/16   Referring Provider [facility]      Discharge Exercise Prescription (Final Exercise Prescription Changes):     Exercise Prescription Changes - 12/07/16 1100      Exercise Review   Progression Yes     Response to Exercise   Blood Pressure (Admit) 100/58   Blood Pressure (Exercise) 156/64   Blood Pressure (Exit) 108/54   Heart Rate (Admit) 74 bpm   Heart Rate (Exercise) 73 bpm   Heart Rate (Exit) 66 bpm   Oxygen Saturation (Admit) 95 %   Oxygen Saturation (Exercise) 96 %   Oxygen Saturation (Exit) 97 %   Rating of Perceived Exertion (Exercise) 12   Perceived Dyspnea (Exercise) 3   Comments none   Duration Progress to 45 minutes of aerobic exercise without signs/symptoms of physical distress   Intensity THRR unchanged     Progression   Progression Continue to progress workloads to maintain intensity without signs/symptoms of physical distress.     Resistance Training   Training Prescription Yes    Weight 3   Reps 10-12     Interval Training   Interval Training Yes     Oxygen   Oxygen Continuous   Liters 2     Recumbant Bike   Level 2   Minutes 15   METs 2.6     REL-XR   Level 4   Minutes 15   METs 2.2     Biostep-RELP   Level 3   Minutes 15   METs 2      Functional Capacity:     6 Minute Walk    Row Name 06/27/16 1034 09/20/16 1053 11/24/16 1107     6 Minute Walk   Phase  - Mid Program Discharge   Distance 500 feet 888 feet 670 feet   Distance % Change  - 77 %  -   Walk Time 4 minutes 5.58 minutes 4.9 minutes   # of Rest Breaks  - 1 2   MPH 1.42 1.8 1.5   METS 1.6 2.48 2.03   RPE _5 Perceived Dyspnea  _6 VO2 Peak 5.5 8.7 7.11   Symptoms Yes (comment) Yes (comment) Yes (comment)   Comments low back pain more than  breathing low back pain "twinge" at end - has had in past - fine with usual seated exercise   Resting HR 69 bpm 102 bpm 75 bpm   Resting BP 138/74 138/68 128/70   Max Ex. HR 87 bpm 118 bpm 97 bpm   Max Ex. BP 152/72 146/64 164/72     Interval HR   Baseline HR  - 2 75   1 Minute HR  - 111 88   2 Minute HR  - 111  -   3 Minute HR  - 115 86   4 Minute HR  -  - 95   5 Minute HR  - 115 97   6 Minute HR  - 118 86   Interval Heart Rate?  - Yes Yes     Interval Oxygen   Interval Oxygen?  - Yes Yes   Baseline Oxygen Saturation %  - 96 % 92 %   Baseline Liters of Oxygen  - 2 L 2 L   1 Minute Oxygen Saturation %  - 91 % 93 %   1 Minute Liters of Oxygen  - 2 L 2 L   2 Minute Oxygen Saturation %  - 90 %  -   2 Minute Liters of Oxygen  - 2 L 2 L   3 Minute Oxygen Saturation %  - 90 % 91 %   3 Minute Liters of Oxygen  - 2 L 2 L   4 Minute Oxygen Saturation %  -  - 93 %   4 Minute Liters of Oxygen  - 2 L 2 L   5 Minute Oxygen Saturation %  - 91 % 92 %   5 Minute Liters of Oxygen  - 2 L 2 L   6 Minute Oxygen Saturation %  - 89 % 89 %   6 Minute Liters of Oxygen  - 2 L 2 L   2 Minute Post Liters of Oxygen  - 2 L 2 L       Psychological, QOL, Others - Outcomes: PHQ 2/9: Depression screen [facility] 2/9 11/20/2016 06/27/2016  Decreased Interest 1 0  Down, Depressed, Hopeless 1 0  PHQ - 2 Score 2 0  Altered sleeping 1 1  Tired, decreased energy 2 2  Change in appetite 1 1  Feeling bad or failure about yourself  0 0  Trouble concentrating 0 0  Moving slowly or fidgety/restless 0 0  Suicidal thoughts 0 0  PHQ-9 Score 6 4  Difficult doing work/chores Very difficult Somewhat difficult    Quality of Life:     Quality of Life - 11/20/16 1514      Quality of Life Scores   Health/Function Pre 19.5 %   Health/Function Post 19.81 %   Health/Function % Change 1.59 %   Socioeconomic Pre 19.17 %   Socioeconomic Post 19.29 %   Socioeconomic % Change  0.63 %   Psych/Spiritual Pre 19 %   Psych/Spiritual Post 19.71 %   Psych/Spiritual % Change 3.74 %   Family Pre 20.6 %   Family Post 20 %   Family % Change -2.91 %   GLOBAL Pre 19.5 %   GLOBAL Post 19.71 %   GLOBAL % Change 1.08 %      Personal Goals: Goals established at orientation with interventions provided to work toward goal.     Personal Goals and Risk Factors at Admission - 06/27/16 1136      Core Components/Risk Factors/Patient Goals on Admission  Weight Management Yes;Weight Loss   Intervention Weight Management: Develop a combined nutrition and exercise program designed to reach desired caloric intake, while maintaining appropriate intake of nutrient and fiber, sodium and fats, and appropriate energy expenditure required for the weight goal.;Weight Management: Provide education and appropriate resources to help participant work on and attain dietary goals.   Admit Weight 204 lb 6.4 oz (92.7 kg)   Goal Weight: Short Term 200 lb (90.7 kg)   Goal Weight: Long Term 150 lb (68 kg)   Expected Outcomes Short Term: Continue to assess and modify interventions until short term weight is achieved   Sedentary Yes   Intervention Provide advice,  education, support and counseling about physical activity/exercise needs.;Develop an individualized exercise prescription for aerobic and resistive training based on initial evaluation findings, risk stratification, comorbidities and participant's personal goals.  Home stationary exercise bike   Expected Outcomes Achievement of increased cardiorespiratory fitness and enhanced flexibility, muscular endurance and strength shown through measurements of functional capacity and personal statement of participant.   Increase Strength and Stamina Yes   Intervention Provide advice, education, support and counseling about physical activity/exercise needs.;Develop an individualized exercise prescription for aerobic and resistive training based on initial evaluation findings, risk stratification, comorbidities and participant's personal goals.   Expected Outcomes Achievement of increased cardiorespiratory fitness and enhanced flexibility, muscular endurance and strength shown through measurements of functional capacity and personal statement of participant.   Improve shortness of breath with ADL's Yes   Intervention Provide education, individualized exercise plan and daily activity instruction to help decrease symptoms of SOB with activities of daily living.   Expected Outcomes Short Term: Achieves a reduction of symptoms when performing activities of daily living.   Develop more efficient breathing techniques such as purse lipped breathing and diaphragmatic breathing; and practicing self-pacing with activity Yes   Intervention Provide education, demonstration and support about specific breathing techniuqes utilized for more efficient breathing. Include techniques such as pursed lipped breathing, diaphragmatic breathing and self-pacing activity.   Expected Outcomes Short Term: Participant will be able to demonstrate and use breathing techniques as needed throughout daily activities.   Increase knowledge of  respiratory medications and ability to use respiratory devices properly  Yes   Intervention Provide education and demonstration as needed of appropriate use of medications, inhalers, and oxygen therapy.  Oxygen 2l/m [place]; MDI's: Combivent Respimat, Symbicort; Svn: Albuterol; Uses a spacer   Expected Outcomes Short Term: Achieves understanding of medications use. Understands that oxygen is a medication prescribed by physician. Demonstrates appropriate use of inhaler and oxygen therapy.   Hypertension Yes   Intervention Provide education on lifestyle modifcations including regular physical activity/exercise, weight management, moderate sodium restriction and increased consumption of fresh fruit, vegetables, and low fat dairy, alcohol moderation, and smoking cessation.;Monitor prescription use compliance.   Expected Outcomes Short Term: Continued assessment and intervention until BP is < 140/38mm HG in hypertensive participants. < 130/67mm HG in hypertensive participants with diabetes, heart failure or chronic kidney disease.;Long Term: Maintenance of blood pressure at goal levels.       Personal Goals Discharge:     Goals and Risk Factor Review    Row Name 07/17/16 1126 07/21/16 1215 08/02/16 1158 09/14/16 0957 09/18/16 1501     Core Components/Risk Factors/Patient Goals Review   Personal Goals Review Develop more efficient breathing techniques such as purse lipped breathing and diaphragmatic breathing and practicing self-pacing with activity. Weight Management/Obesity;Hypertension Increase knowledge of respiratory medications and ability to use respiratory devices properly.;Improve  shortness of breath with ADL's;Sedentary;Develop more efficient breathing techniques such as purse lipped breathing and diaphragmatic breathing and practicing self-pacing with activity. Improve shortness of breath with ADL's;Increase Strength and Stamina;Increase knowledge of respiratory medications and ability to use  respiratory devices properly.;Develop more efficient breathing techniques such as purse lipped breathing and diaphragmatic breathing and practicing self-pacing with activity. Increase knowledge of respiratory medications and ability to use respiratory devices properly.   Review Discussed pursed lip breathing techniques with patient. He stated that he is comfortable using pursed lip breathing and uses it in his daily activities to help control SOB.  Discussed the two risk factors with [name] today. He has goal od weight down to 200 lbs short term. At 204 lbs now, Was 210 not to long ago.  He stated that he still eats a variey of foods fried and grilled.  He has deferred an RD appointment. I di remind hiom that the RD is available if he does not see weight change as desired, so he can review his nutrition plan. He is walking at home as well as coming to the prgoram. He is new to the program and expects to see change as he comes to the sessions. Hypertension is controlled at present time with medications nd the exercise he is doing in the program and at home.  He stated he is aware to watch for the amount of sodium intake per day.  Mr Poplaski noticed a difference walking into [name] today - easier and less shortness of breath. He is walking around the house more and plans to start using his stationary bike at home. He has good technique with his PLB. Mr Fairhurst has returned to [place] with 2 weeks off due to an COPD excerbation. He had his SVN changed to Duoneb and was educated on the 2 medicine in the [place]. He has a good understanding of his inhalers and oxygen. PLB is a daily therapy for him with activity. He has improved his exercise goals and has more stamina for activities at home and walking. Mr Sandy is complaining of increased sputum production after his exacerbation which was treated with 2 antibiotics and 2 rounds of predisone. Educated him on increasing his water intake to 8 classes a day to liquidfy  his sputum and showed him a flutter valve. He has an appointment with his pulmonologist this Thursday at the [place] and will ask about it.    Expected Outcomes Patient will continue to use pursed lip breathing during his exercise sessions in order to be able to continue with progression of workloads to increase strengh and stamina.  Weight loss goal to 200 lbs met prior to discharge. RD inout if no changes noted midway through program.  COntinued compliance with medications , continued exercise for blood pressure control maintenance  -  -  -   Row Name 09/27/16 1540 10/11/16 0713 10/11/16 0716 11/06/16 1247 12/05/16 0645     Core Components/Risk Factors/Patient Goals Review   Personal Goals Review Increase knowledge of respiratory medications and ability to use respiratory devices properly. Develop more efficient breathing techniques such as purse lipped breathing and diaphragmatic breathing and practicing self-pacing with activity.;Sedentary;Improve shortness of breath with ADL's;Increase knowledge of respiratory medications and ability to use respiratory devices properly.;Increase Strength and Stamina Weight Management/Obesity;Hypertension Sedentary;Weight Management/Obesity;Develop more efficient breathing techniques such as purse lipped breathing and diaphragmatic breathing and practicing self-pacing with activity. Sedentary;Weight Management/Obesity;Increase Strength and Stamina;Improve shortness of breath with ADL's;Increase knowledge of respiratory medications and ability  to use respiratory devices properly.;Develop more efficient breathing techniques such as purse lipped breathing and diaphragmatic breathing and practicing self-pacing with activity.;Hypertension   Review Mr Emmerich received from the [place] a new flutter valve called the VibraPep. He is using it twice a day and has increased his sputum production.  - Mr Karie Soda is progressing in [place] with his exercise goals. He has a stationary bike at  home, although he has not used it during [employer]. His blood pressure readings are acceptable and his weight is consistent throughout the program. He has a good understanding of his MDI's and is very compliant the schedule. Due to a consistent cough as of late, he has added Mucinex which has helped him expectorate. A recent CT of the lungs confirmed clear lungs. PLB is a daily technique used to manage his COPD, as well as his oxygen. He is noticing improvement in his stamina and does attend regularly. [name] states his weight has stayed consistent at around 204 lb.  He has a bike at home that he plans to use in addition to walking.  His low back pain sometimes inhibits his walking but he states he can rest and then continue.  He is "up and down" all day.  He has good and bad days breathing.  He does use PLB when short of breath. Mr Straughter will be graduating this week. He has improved his stamina and strength and has a good understanding of the importance of exercise. He will continue exercise at the [facility]. His post 52md improved 17760f- Minimal Importance Difference for COPD is 98.60f51fHe has a good understanding of his inhalers and recently acquired a flutter valve which he uses with increased sputum production. Mr RitBrosseaus lost 6 lbs during the program. He is eatting less bread and smaller portion sizes.  Using PLB and pacing, Mr Bickhart manages his shortness of breath. He is compliant with his blood pressure medication and has had acceptable BP in [name].   Expected Outcomes Continue using flutter valve for secretion mobilization.  - Continue attended [name] for improvement in his exercise capacity and management skills for COPD. MikRonalee Beltsll continue to be active and maintain his weight , and keep his stamina up by being active throughout the day. Continue managing his COPD with exercise, knowledge [facility] of inhaler's, good diet, and his 2l/m of oxygen.      Nutrition & Weight -  Outcomes:     Pre Biometrics - 06/27/16 1034      Pre Biometrics   Height _0  (1.676 m)   Weight 204 lb 6.4 oz (92.7 kg)   Waist Circumference 49 inches   Hip Circumference 48 inches   Waist to Hip Ratio 1.02 %   BMI (Calculated) 33.1         Post Biometrics - 11/24/16 1107       Post  Biometrics   Height _1  (1.676 m)   Weight 202 lb (91.6 kg)   Waist Circumference 48.5 inches   Hip Circumference 46.25 inches   Waist to Hip Ratio 1.05 %   BMI (Calculated) 32.7      Nutrition:   Nutrition Discharge:   Education Questionnaire Score:     Knowledge Questionnaire Score - 11/20/16 1512      Knowledge Questionnaire Score   Post Score 9/10      Goals reviewed with patient; copy given to patient.

## 2016-12-08 NOTE — Progress Notes (Signed)
Daily Session Note  Patient Details  Name: Darius Norris MRN: [id number] Date of Birth: 08/19/1951 Referring Provider:   Arn Medal Row Documentation from 09/06/2016 in [facility]  Referring Provider  [facility]      Encounter Date: 12/08/2016  Check In:     Session Check In - 12/08/16 1053      Check-In   Location [facility]-Cardiac & Pulmonary Rehab   Staff Present Gerlene Burdock, RN, Vickki Hearing, BA, ACSM CEP, Exercise Physiologist;Phat Dalton RN BSN   Supervising physician immediately available to respond to emergencies [name] immediately available ER MD   Physician(s) Dr. Joni Fears and Dr. Reita Cliche   Medication changes reported     No   Fall or balance concerns reported    No   Warm-up and Cool-down Performed as group-led instruction   Resistance Training Performed Yes   VAD Patient? No     Pain Assessment   Currently in Pain? No/denies         Goals Met:  Proper associated with RPD/PD & O2 Sat Independence with exercise equipment Using PLB without cueing & demonstrates good technique Exercise tolerated well No report of cardiac concerns or symptoms Strength training completed today  Goals Unmet:  Not Applicable  Comments:  [name] graduated today from cardiac rehab with 36 sessions completed.  Details of the patient's exercise prescription and what he needs to do in order to continue the prescription and progress were discussed with patient.  Patient was given a copy of prescription and goals.  Patient verbalized understanding.  [name] plans to continue to exercise by signing up for either the [employer] or by signing up for the [employer].      Dr. Emily Filbert is Medical Director for [place] and [name].

## 2017-01-08 IMAGING — CR DG CHEST 2V
1 series · 2 of 2 positions shown · non-contrast
Comparison: Chest radiograph and CT dated 02/04/2013

CLINICAL DATA: 64-year-old male with chest pain and pressure.

EXAM:
CHEST  2 VIEW

[Series 1: w chest pa · 0.14mm/px · 2 of 2 slices shown]
[im 1/2]
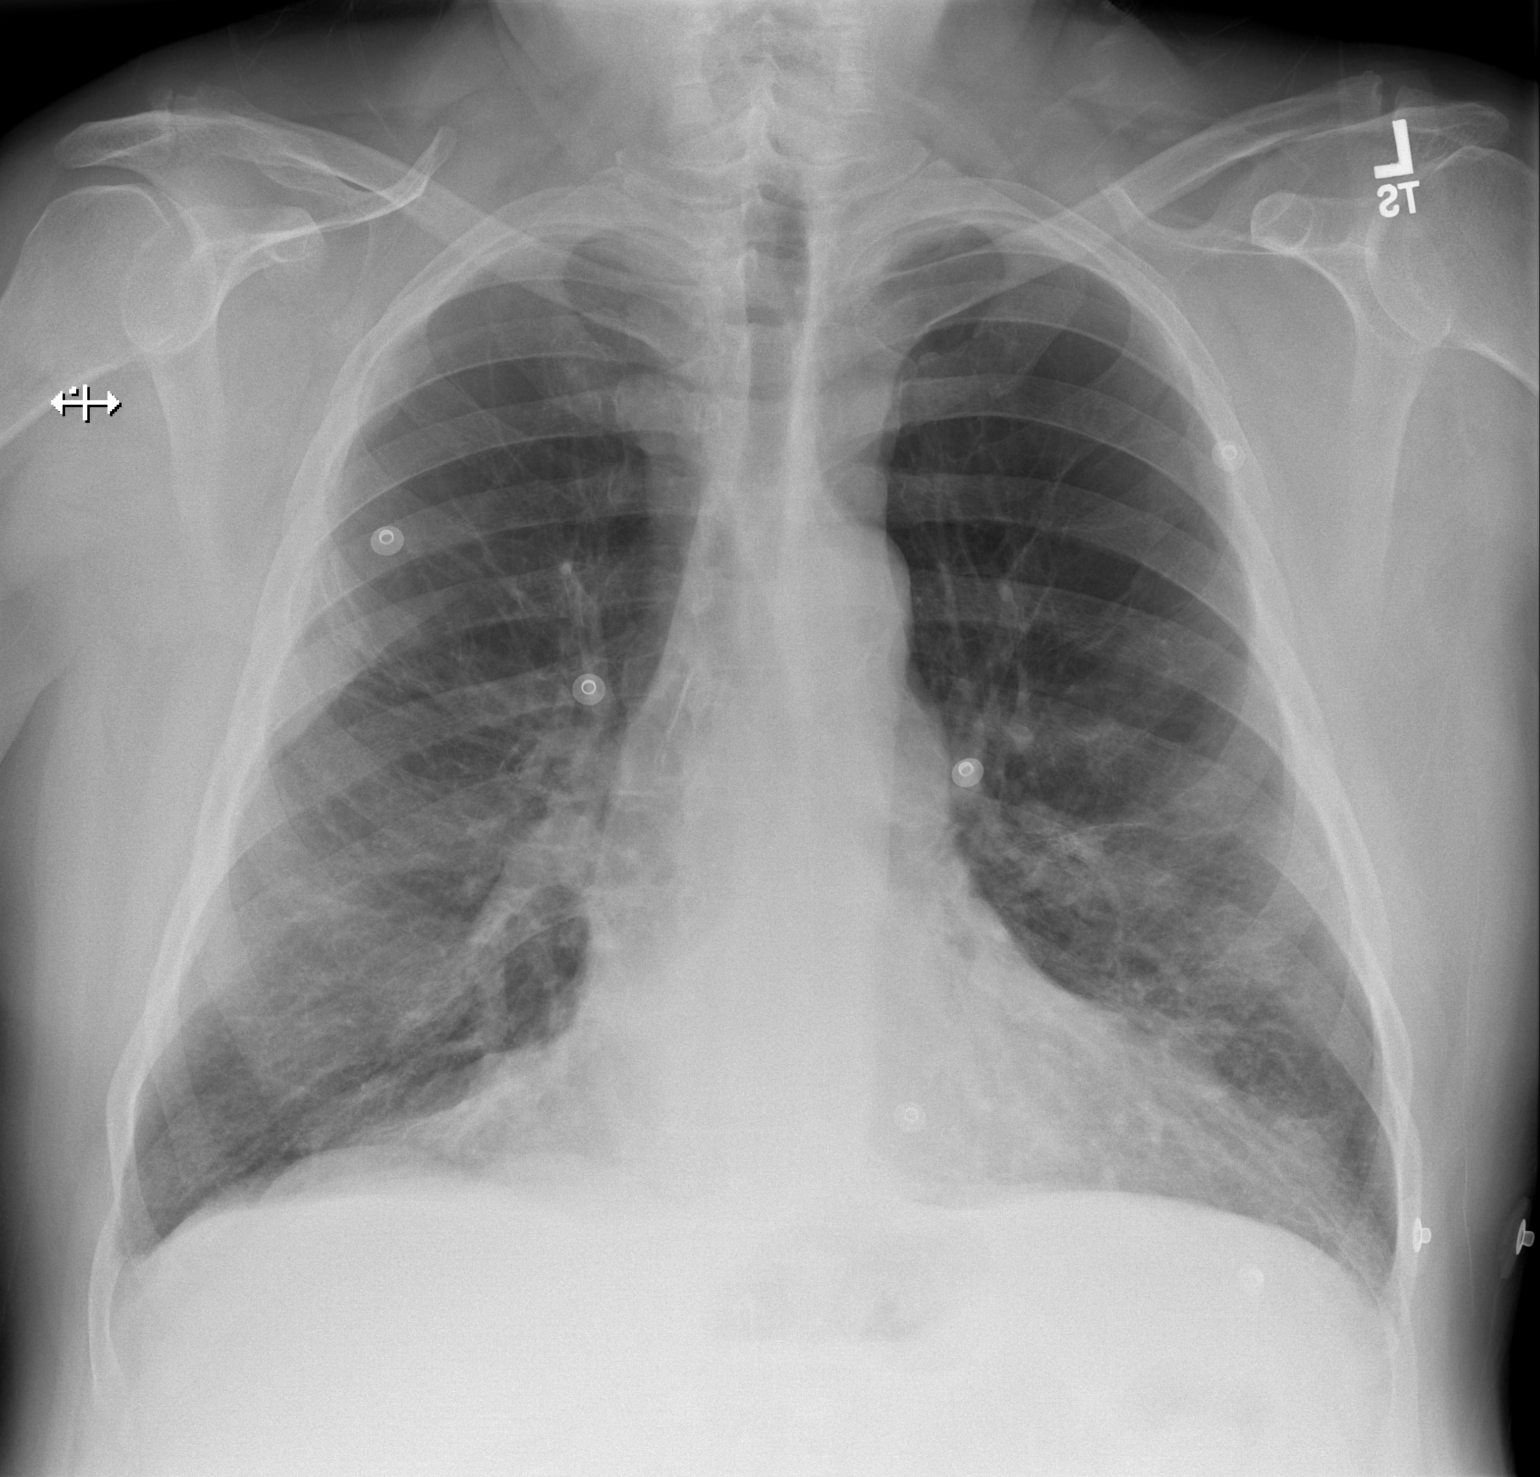
[im 2/2]
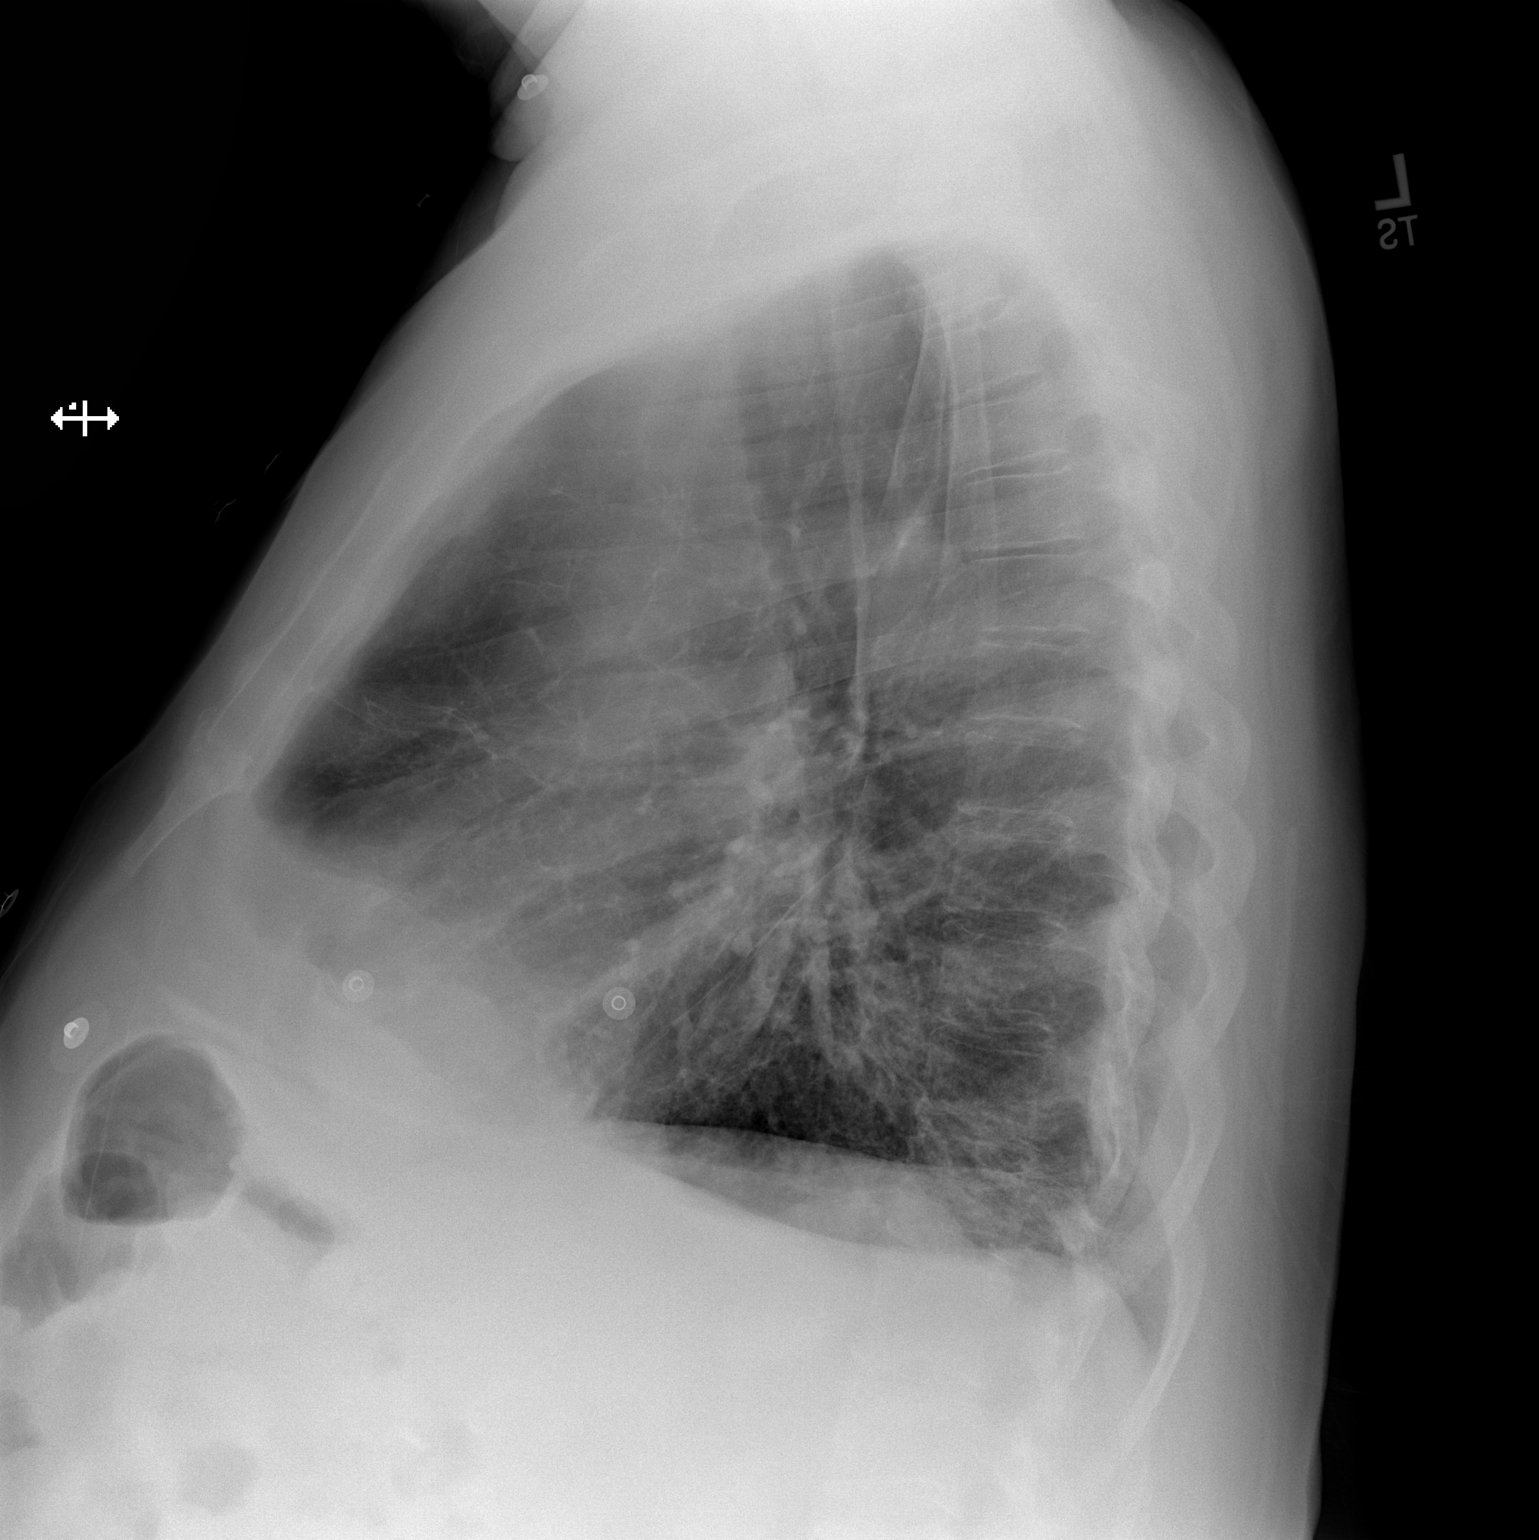

[2 of 2 positions shown; findings below may reference images not displayed]

FINDINGS: Two views of the chest demonstrate emphysematous changes of the
lungs with bibasilar atelectasis/scarring. No focal consolidation,
pleural effusion, or pneumothorax. Top-normal cardiac silhouette.
The osseous structures appear unremarkable.
IMPRESSION: Emphysema.  No focal consolidation or pneumothorax.

## 2017-09-22 ENCOUNTER — Emergency Department
Admission: EM | Admit: 2017-09-22 | Discharge: 2017-09-22 | Disposition: A | Discharge status: Home / Self Care | Payer: Medicare Other | Attending: Emergency Medicine | Admitting: Emergency Medicine

## 2017-09-22 ENCOUNTER — Other Ambulatory Visit: Payer: Self-pay

## 2017-09-22 ENCOUNTER — Emergency Department: Payer: Medicare Other

## 2017-09-22 ENCOUNTER — Encounter: Payer: Self-pay | Admitting: Emergency Medicine

## 2017-09-22 DIAGNOSIS — R0602 Shortness of breath: Secondary | ICD-10-CM | POA: Diagnosis present

## 2017-09-22 DIAGNOSIS — E876 Hypokalemia: Secondary | ICD-10-CM | POA: Insufficient documentation

## 2017-09-22 DIAGNOSIS — J441 Chronic obstructive pulmonary disease with (acute) exacerbation: Secondary | ICD-10-CM | POA: Insufficient documentation

## 2017-09-22 DIAGNOSIS — Z87891 Personal history of nicotine dependence: Secondary | ICD-10-CM | POA: Insufficient documentation

## 2017-09-22 DIAGNOSIS — I2510 Atherosclerotic heart disease of native coronary artery without angina pectoris: Secondary | ICD-10-CM | POA: Diagnosis not present

## 2017-09-22 DIAGNOSIS — Z79899 Other long term (current) drug therapy: Secondary | ICD-10-CM | POA: Insufficient documentation

## 2017-09-22 DIAGNOSIS — I10 Essential (primary) hypertension: Secondary | ICD-10-CM | POA: Insufficient documentation

## 2017-09-22 LAB — CBC WITH DIFFERENTIAL/PLATELET
Basophils Absolute: 0.1 10*3/uL (ref 0–0.1)
Basophils Relative: 1 %
EOS ABS: 0.2 10*3/uL (ref 0–0.7)
Eosinophils Relative: 3 %
HCT: 46.7 % (ref 40.0–52.0)
HEMOGLOBIN: 15.7 g/dL (ref 13.0–18.0)
Lymphocytes Relative: 23 %
Lymphs Abs: 2 10*3/uL (ref 1.0–3.6)
MCH: 28.5 pg (ref 26.0–34.0)
MCHC: 33.6 g/dL (ref 32.0–36.0)
MCV: 84.8 fL (ref 80.0–100.0)
MONOS PCT: 10 %
Monocytes Absolute: 0.8 10*3/uL (ref 0.2–1.0)
NEUTROS PCT: 65 %
Neutro Abs: 5.6 10*3/uL (ref 1.4–6.5)
Platelets: 216 10*3/uL (ref 150–440)
RBC: 5.51 MIL/uL (ref 4.40–5.90)
RDW: 16.6 % — ABNORMAL HIGH (ref 11.5–14.5)
WBC: 8.7 10*3/uL (ref 3.8–10.6)

## 2017-09-22 LAB — BASIC METABOLIC PANEL
Anion gap: 11 (ref 5–15)
BUN: 9 mg/dL (ref 6–20)
CHLORIDE: 99 mmol/L — AB (ref 101–111)
CO2: 30 mmol/L (ref 22–32)
CREATININE: 0.97 mg/dL (ref 0.61–1.24)
Calcium: 9.1 mg/dL (ref 8.9–10.3)
GFR calc non Af Amer: >60 mL/min (ref >60)
Glucose, Bld: 149 mg/dL — ABNORMAL HIGH (ref 65–99)
Potassium: 3.1 mmol/L — ABNORMAL LOW (ref 3.5–5.1)
SODIUM: 140 mmol/L (ref 135–145)

## 2017-09-22 LAB — TROPONIN I

## 2017-09-22 MED ORDER — IPRATROPIUM-ALBUTEROL 0.5-2.5 (3) MG/3ML IN SOLN
3.0000 mL | Freq: Once | RESPIRATORY_TRACT | Status: AC
  Administered 2017-09-22: 3 mL via RESPIRATORY_TRACT
  Filled 2017-09-22: qty 6

## 2017-09-22 MED ORDER — IPRATROPIUM-ALBUTEROL 0.5-2.5 (3) MG/3ML IN SOLN
RESPIRATORY_TRACT | Status: AC
  Administered 2017-09-22: 6 mL via RESPIRATORY_TRACT
  Filled 2017-09-22: qty 9

## 2017-09-22 MED ORDER — POTASSIUM CHLORIDE CRYS ER 20 MEQ PO TBCR
40.0000 meq | EXTENDED_RELEASE_TABLET | Freq: Once | ORAL | Status: AC
  Administered 2017-09-22: 40 meq via ORAL
  Filled 2017-09-22: qty 2

## 2017-09-22 MED ORDER — IPRATROPIUM-ALBUTEROL 0.5-2.5 (3) MG/3ML IN SOLN
6.0000 mL | Freq: Once | RESPIRATORY_TRACT | Status: AC
  Administered 2017-09-22: 6 mL via RESPIRATORY_TRACT

## 2017-09-22 MED ORDER — LEVOFLOXACIN 250 MG PO TABS: 250.0000 mg | ORAL_TABLET | Freq: Every day | ORAL | 0 refills | Status: AC

## 2017-09-22 MED ORDER — LORAZEPAM 1 MG PO TABS
1.0000 mg | ORAL_TABLET | Freq: Once | ORAL | Status: AC
  Administered 2017-09-22: 1 mg via ORAL
  Filled 2017-09-22: qty 1

## 2017-09-22 MED ORDER — PREDNISONE 20 MG PO TABS: 40.0000 mg | ORAL_TABLET | Freq: Every day | ORAL | 0 refills | Status: DC

## 2017-09-22 NOTE — ED Triage Notes (Signed)
Patient arrives from home via [facility] with c/o [facility]. Patient has a hx of COPD. Patient states that there is some  "heating and air conditioner work" happening at his house and it "kicked up some dust" flaring up his COPD.

## 2017-09-22 NOTE — Discharge Instructions (Signed)
Please seek medical attention for any high fevers, chest pain, shortness of breath, change in behavior, persistent vomiting, bloody stool or any other new or concerning symptoms.  

## 2017-09-22 NOTE — ED Provider Notes (Signed)
[facility] Emergency Department Provider Note   ____________________________________________   I have reviewed the triage vital signs and the nursing notes.   HISTORY  Chief Complaint Shortness of Breath   History limited by: Not Limited   HPI Darius Norris is a 66 y.o. male who presents to the emergency department today because of concern for shortness of breath. The patient has a history of COPD. He states he is chronically on 1 L of oxygen at home. Today he had some people out working on his HVAC and states that they kept up a lot of dust. He then had pretty sudden onset of shortness of breath. He feels some chest tightness and discomfort and his bilateral lower ribs with this shortness of breath. He tried taking some albuterol home without any significant relief. EMS states that he has been in the mid to high 90s on oxygen. They did give him 125 mg of Solu-Medrol as well as 1 DuoNeb treatment. The patient did feel some relief after the DuoNeb treatment.   Past Medical History:  Diagnosis Date  . CAD (coronary artery disease)   . Colitis   . COPD (chronic obstructive pulmonary disease) ([place])   . Emphysema lung ([place])   . GERD (gastroesophageal reflux disease)   . Hypertension     Patient Active Problem List   Diagnosis Date Noted  . Unstable angina ([place]) 09/04/2016  . Uncontrolled hypertension 09/04/2016  . Hypokalemia 09/04/2016  . Leukocytosis 09/04/2016  . Hyperglycemia 09/04/2016  . COPD exacerbation ([place]) 12/15/2015  . Elevated troponin 12/15/2015  . HTN (hypertension) 12/15/2015  . GERD (gastroesophageal reflux disease) 12/15/2015  . CAD (coronary artery disease) 12/15/2015  . Angina pectoris ([place]) 12/15/2015    Past Surgical History:  Procedure Laterality Date  . ABDOMINAL HERNIA REPAIR Bilateral     Prior to Admission medications   Medication Sig Start Date End Date Taking? Authorizing Provider  acetaminophen (TYLENOL) 325 MG  tablet Take 975 mg by mouth 3 (three) times daily.    [provider]  amitriptyline (ELAVIL) 10 MG tablet Take 30 mg by mouth at bedtime.    [provider]  atorvastatin (LIPITOR) 80 MG tablet Take 40 mg by mouth at bedtime.    [provider]  budesonide-formoterol (SYMBICORT) 160-4.5 MCG/ACT inhaler Inhale 2 puffs into the lungs 2 (two) times daily.    [provider]  busPIRone (BUSPAR) 10 MG tablet Take 10 mg by mouth 2 (two) times daily.    [provider]  clobetasol cream (TEMOVATE) 7.62 % Apply 1 application topically daily as needed. For itching on psoriasis areas    [provider]  diclofenac sodium (VOLTAREN) 1 % GEL Apply 2 g topically 2 (two) times daily.    [provider]  finasteride (PROSCAR) 5 MG tablet Take 5 mg by mouth daily.    [provider]  fluticasone (FLONASE) 50 MCG/ACT nasal spray Place 2 sprays into both nostrils daily.    [provider]  furosemide (LASIX) 20 MG tablet Take 1 tablet (20 mg total) by mouth daily. 09/05/16   Epifanio Lesches, MD  ipratropium (ATROVENT) 0.02 % nebulizer solution Take 2.5 mLs (0.5 mg total) by nebulization every 6 (six) hours as needed for wheezing or shortness of breath. 09/05/16   Epifanio Lesches, MD  Ipratropium-Albuterol (COMBIVENT RESPIMAT) 20-100 MCG/ACT AERS respimat Inhale 1 puff into the lungs every 6 (six) hours as needed for wheezing or shortness of breath.    [provider]  isosorbide mononitrate (IMDUR) 60 MG 24 hr tablet Take 60 mg by mouth daily.    [provider]  levofloxacin (LEVAQUIN) 750 MG tablet Take 1 tablet (750 mg total) by mouth daily at 6 PM. 09/05/16   Epifanio Lesches, MD  levofloxacin (LEVAQUIN) 750 MG tablet Take 1 tablet (750 mg total) by mouth daily. 09/05/16   Epifanio Lesches, MD  lisinopril (PRINIVIL,ZESTRIL) 20 MG tablet Take 10 mg by mouth daily.    [provider]  loratadine  (CLARITIN) 10 MG tablet Take 10 mg by mouth daily.    [provider]  metoprolol (LOPRESSOR) 50 MG tablet Take 100 mg by mouth 2 (two) times daily.    [provider]  omeprazole (PRILOSEC) 20 MG capsule Take 40 mg by mouth daily.    [provider]  predniSONE (STERAPRED UNI-PAK 21 TAB) 10 MG (21) TBPK tablet Take 1 tablet (10 mg total) by mouth daily. Taper as prescribed 09/05/16   Epifanio Lesches, MD  salicyclic acid-sulfur (SEBULEX) 2-2 % shampoo Apply 1 application topically daily.    [provider]  senna-docusate (SENOKOT-S) 8.6-50 MG tablet Take 2 tablets by mouth 2 (two) times daily.    [provider]  simethicone (MYLICON) 80 MG chewable tablet Chew 80 mg by mouth 3 (three) times daily as needed for flatulence.    [provider]    Allergies Zantac [ranitidine hcl]; Codeine; Erythromycin; and Vicodin [hydrocodone-acetaminophen]  History reviewed. No pertinent family history.  Social History Social History  Substance Use Topics  . Smoking status: Former Research scientist (life sciences)  . Smokeless tobacco: Never Used  . Alcohol use No    Review of Systems Constitutional: No fever/chills Eyes: No visual changes. ENT: No sore throat. Cardiovascular: Positive for chest tightness. Respiratory: Positive for shortness of breath Gastrointestinal: No abdominal pain.  No nausea, no vomiting.  No diarrhea.   Genitourinary: Negative for dysuria. Musculoskeletal: Negative for back pain. Skin: Negative for rash. Neurological: Negative for headaches, focal weakness or numbness.  ____________________________________________   PHYSICAL EXAM:  VITAL SIGNS: ED Triage Vitals  Enc Vitals Group     BP -- 193/99     Pulse -- 88     Resp -- 33     Temp -- 98.4     Temp src --      SpO2 -- 97     Weight 09/22/17 1400 205 lb (93 kg)     Height 09/22/17 1400 5\' 6"  (1.676 m)     Head Circumference --      Peak Flow --    Constitutional: Alert  and oriented. In slight respiratory distress. Eyes: Conjunctivae are normal.  ENT   Head: Normocephalic and atraumatic.   Nose: No congestion/rhinnorhea.   Mouth/Throat: Mucous membranes are moist.   Neck: No stridor. Hematological/Lymphatic/Immunilogical: No cervical lymphadenopathy. Cardiovascular: Normal rate, regular rhythm.  No murmurs, rubs, or gallops. Respiratory: Increased respiratory effort. Decreased air movement diffusely. Slight crackle in lower lungs. Slight expiratory wheeze.  Gastrointestinal: Soft and non tender. No rebound. No guarding.  Genitourinary: Deferred Musculoskeletal: Normal range of motion in all extremities. No lower extremity edema. Neurologic:  Normal speech and language. No gross focal neurologic deficits are appreciated.  Skin:  Skin is warm, dry and intact. No rash noted. Psychiatric: Mood and affect are normal. Speech and behavior are normal. Patient exhibits appropriate insight and judgment.  ____________________________________________    LABS (pertinent positives/negatives)  Trop <0.03 BMP wnl except K 3.1, Ch 99, Glu  149 CBC wnl except RDW 16.6  ____________________________________________   EKG  I, Nance Pear, attending physician, personally viewed and interpreted this EKG  EKG Time: 1409 Rate: 85 Rhythm: sinus rhythm Axis: normal Intervals: qtc 464 QRS: narrow ST changes: no st elevation Impression: abnormal ekg   ____________________________________________    RADIOLOGY  CXR COPD no acute process   ____________________________________________   PROCEDURES  Procedures  ____________________________________________   INITIAL IMPRESSION / ASSESSMENT AND PLAN / ED COURSE  Pertinent labs & imaging results that were available during my care of the patient were reviewed by me and considered in my medical decision making (see chart for details).  Patient presented to the emergency department today  because of concerns for shortness breath. Differential includes anemia, infection, pneumonia, pneumothorax, cardiac disease, COPD exacerbation, CHF exacerbation amongst other things. Patient blood work was checked he was not anemic and troponin was negative. BMP did show that the potassium slightly low however EKG did not show any concerning arrhythmia. Patient did feel better after treatment. Will discharge with prednisone. Patient was also complaining of possible sinusitis given concern for possible infection will give prescription of Levaquin.  ____________________________________________   FINAL CLINICAL IMPRESSION(S) / ED DIAGNOSES  Final diagnoses:  COPD exacerbation ([place])  Hypokalemia     Note: This dictation was prepared with Dragon dictation. Any transcriptional errors that result from this process are unintentional     Nance Pear, MD 09/22/17 [phone]

## 2017-09-22 NOTE — ED Notes (Signed)
Patient reports marked improvement

## 2017-09-30 IMAGING — CR DG CHEST 2V
1 series · 2 of 2 positions shown · non-contrast
Comparison: 04/16/2016

CLINICAL DATA: Short of breath

EXAM:
CHEST  2 VIEW

[Series 1: dg chest 2 view · 0.14mm/px · 2 of 2 slices shown]
[im 1/2]
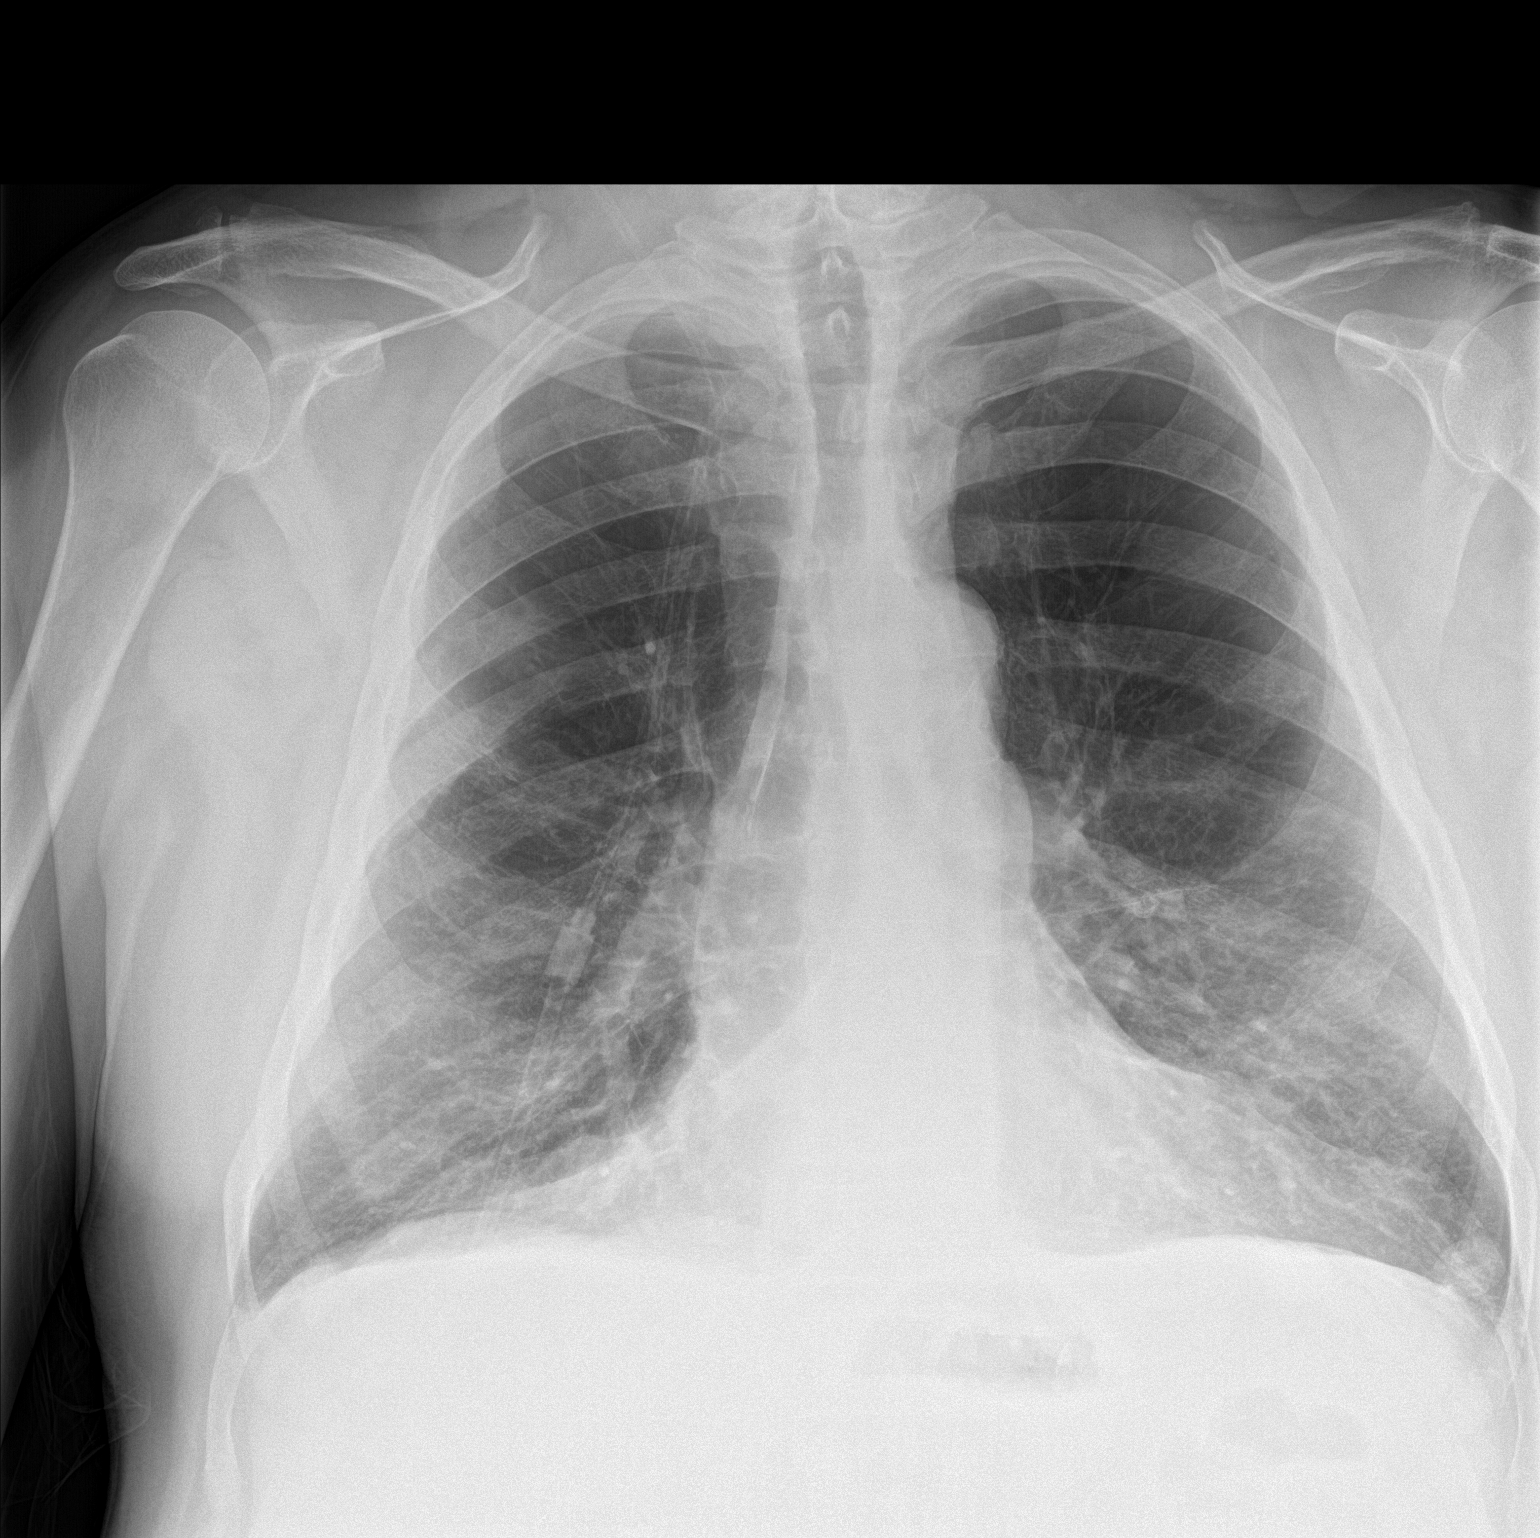
[im 2/2]
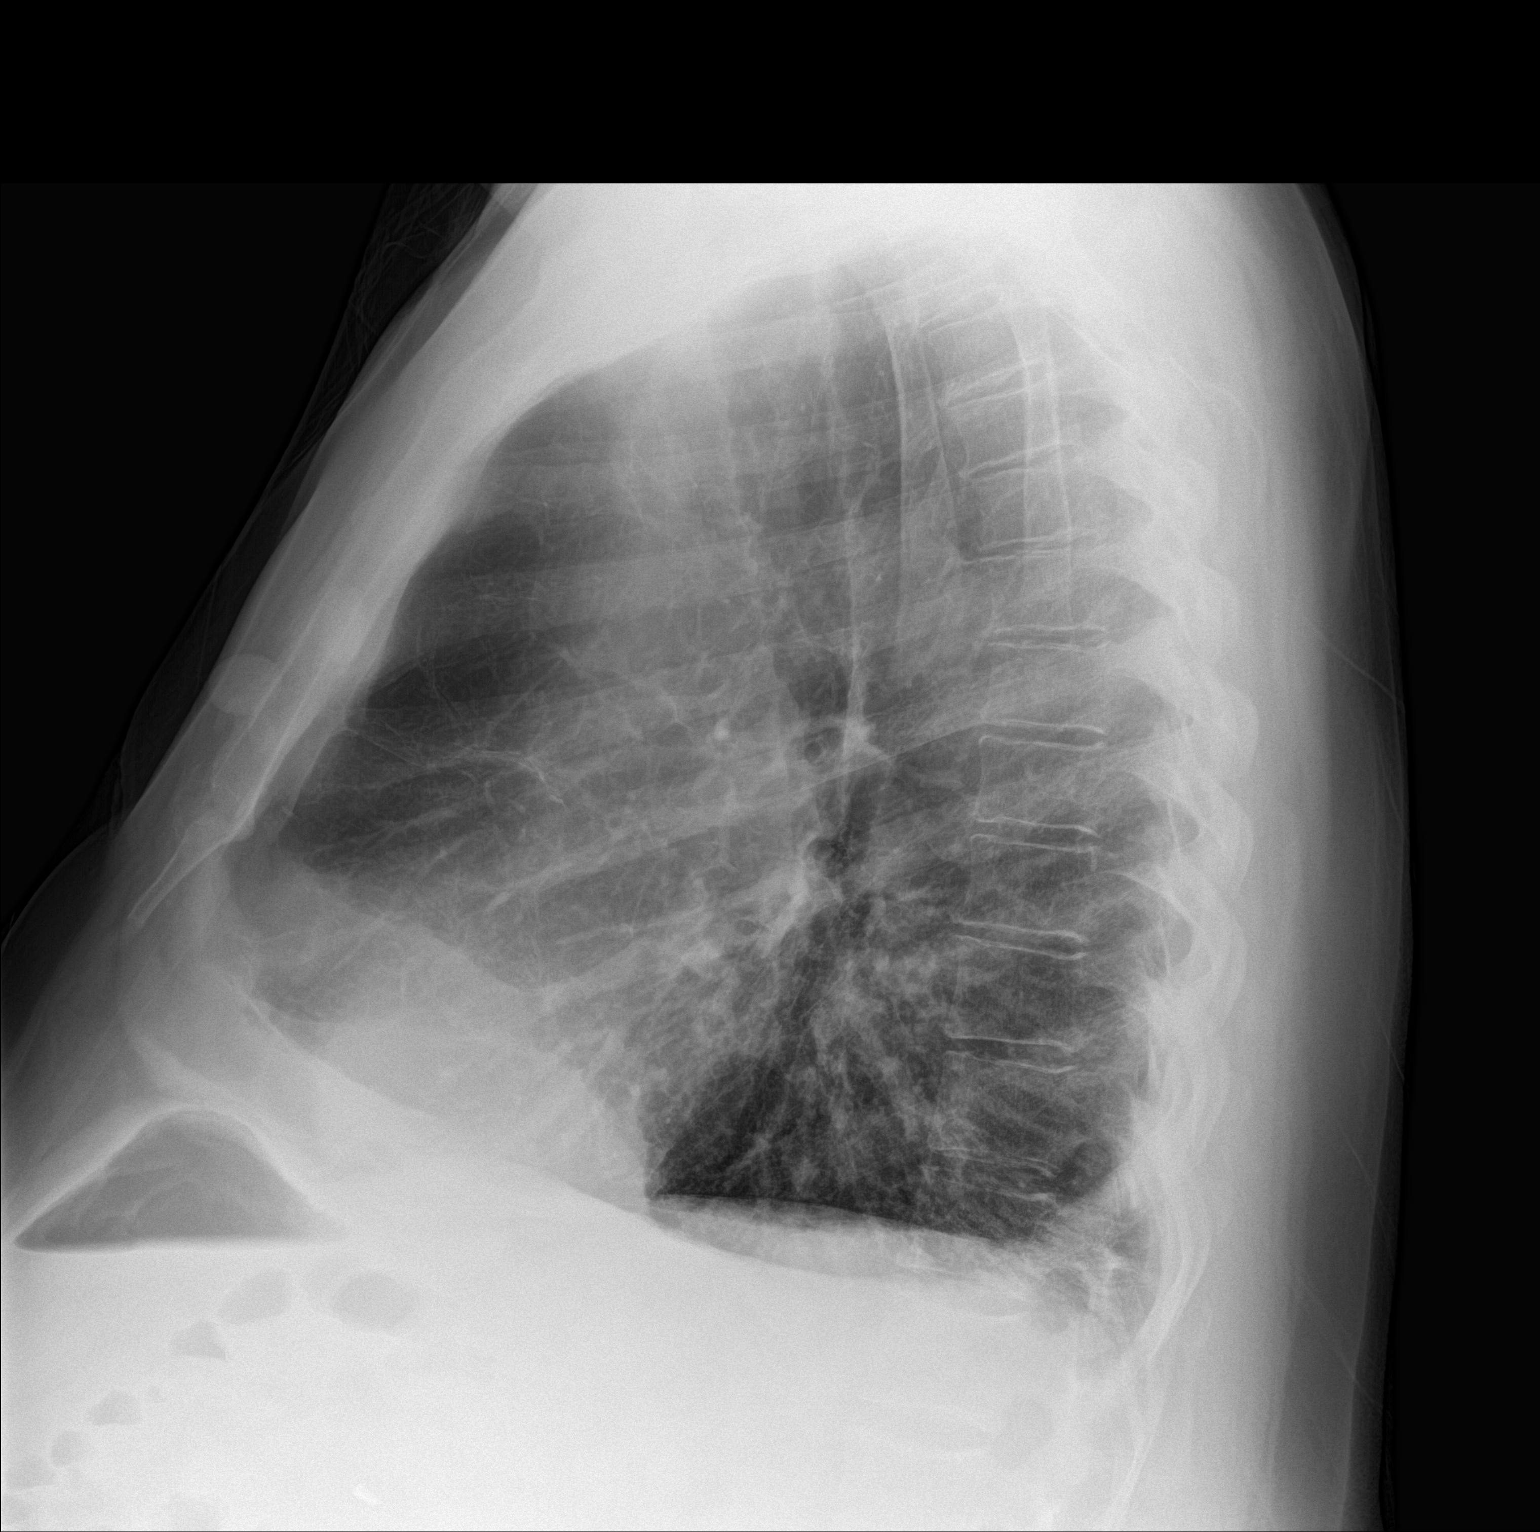

[2 of 2 positions shown; findings below may reference images not displayed]

FINDINGS: Chronic lung disease with COPD and apical emphysema. Crowded
vascular markings in lung bases unchanged. Negative for pneumonia.
Negative for heart failure or effusion. No change from the prior
study.
IMPRESSION: COPD without acute cardiopulmonary abnormality.  Apical emphysema.

## 2017-11-17 ENCOUNTER — Other Ambulatory Visit: Payer: Self-pay

## 2017-11-17 ENCOUNTER — Emergency Department: Payer: Non-veteran care

## 2017-11-17 ENCOUNTER — Observation Stay
Admission: EM | Admit: 2017-11-17 | Discharge: 2017-11-18 | Disposition: A | Discharge status: Home / Self Care | Payer: Non-veteran care | Attending: Internal Medicine | Admitting: Internal Medicine

## 2017-11-17 ENCOUNTER — Observation Stay
Admit: 2017-11-17 | Discharge: 2017-11-17 | Disposition: A | Discharge status: Home / Self Care | Payer: Non-veteran care | Attending: Cardiology | Admitting: Cardiology

## 2017-11-17 ENCOUNTER — Encounter: Payer: Self-pay | Admitting: Emergency Medicine

## 2017-11-17 DIAGNOSIS — I110 Hypertensive heart disease with heart failure: Secondary | ICD-10-CM | POA: Insufficient documentation

## 2017-11-17 DIAGNOSIS — I509 Heart failure, unspecified: Secondary | ICD-10-CM | POA: Insufficient documentation

## 2017-11-17 DIAGNOSIS — Z8249 Family history of ischemic heart disease and other diseases of the circulatory system: Secondary | ICD-10-CM | POA: Diagnosis not present

## 2017-11-17 DIAGNOSIS — J449 Chronic obstructive pulmonary disease, unspecified: Secondary | ICD-10-CM | POA: Diagnosis not present

## 2017-11-17 DIAGNOSIS — I252 Old myocardial infarction: Secondary | ICD-10-CM | POA: Insufficient documentation

## 2017-11-17 DIAGNOSIS — Z87891 Personal history of nicotine dependence: Secondary | ICD-10-CM | POA: Diagnosis not present

## 2017-11-17 DIAGNOSIS — I25110 Atherosclerotic heart disease of native coronary artery with unstable angina pectoris: Principal | ICD-10-CM | POA: Insufficient documentation

## 2017-11-17 DIAGNOSIS — Z79899 Other long term (current) drug therapy: Secondary | ICD-10-CM | POA: Diagnosis not present

## 2017-11-17 DIAGNOSIS — R079 Chest pain, unspecified: Secondary | ICD-10-CM | POA: Diagnosis present

## 2017-11-17 DIAGNOSIS — E785 Hyperlipidemia, unspecified: Secondary | ICD-10-CM | POA: Insufficient documentation

## 2017-11-17 DIAGNOSIS — E876 Hypokalemia: Secondary | ICD-10-CM | POA: Insufficient documentation

## 2017-11-17 DIAGNOSIS — Z885 Allergy status to narcotic agent status: Secondary | ICD-10-CM | POA: Diagnosis not present

## 2017-11-17 DIAGNOSIS — K219 Gastro-esophageal reflux disease without esophagitis: Secondary | ICD-10-CM | POA: Diagnosis not present

## 2017-11-17 HISTORY — DX: Acute myocardial infarction, unspecified: I21.9

## 2017-11-17 LAB — CBC
HEMATOCRIT: 49.7 % (ref 40.0–52.0)
HEMOGLOBIN: 16.4 g/dL (ref 13.0–18.0)
MCH: 28.5 pg (ref 26.0–34.0)
MCHC: 33 g/dL (ref 32.0–36.0)
MCV: 86.3 fL (ref 80.0–100.0)
Platelets: 218 10*3/uL (ref 150–440)
RBC: 5.76 MIL/uL (ref 4.40–5.90)
RDW: 16.6 % — ABNORMAL HIGH (ref 11.5–14.5)
WBC: 8.8 10*3/uL (ref 3.8–10.6)

## 2017-11-17 LAB — BASIC METABOLIC PANEL
Anion gap: 12 (ref 5–15)
BUN: 7 mg/dL (ref 6–20)
CHLORIDE: 99 mmol/L — AB (ref 101–111)
CO2: 29 mmol/L (ref 22–32)
Calcium: 9.5 mg/dL (ref 8.9–10.3)
Creatinine, Ser: 0.99 mg/dL (ref 0.61–1.24)
GFR calc Af Amer: >60 mL/min (ref >60)
GLUCOSE: 189 mg/dL — AB (ref 65–99)
POTASSIUM: 2.6 mmol/L — AB (ref 3.5–5.1)
Sodium: 140 mmol/L (ref 135–145)

## 2017-11-17 LAB — TROPONIN I
Troponin I: <0.03 ng/mL (ref <0.03)
Troponin I: 0.34 ng/mL (ref <0.03)
Troponin I: 0.59 ng/mL (ref <0.03)
Troponin I: 0.48 ng/mL (ref <0.03)

## 2017-11-17 LAB — MAGNESIUM: Magnesium: 2.2 mg/dL (ref 1.7–2.4)

## 2017-11-17 LAB — GLUCOSE, CAPILLARY: GLUCOSE-CAPILLARY: 197 mg/dL — AB (ref 65–99)

## 2017-11-17 LAB — TSH: TSH: 2.21 u[IU]/mL (ref 0.350–4.500)

## 2017-11-17 MED ORDER — ACETAMINOPHEN 325 MG PO TABS
650.0000 mg | ORAL_TABLET | Freq: Four times a day (QID) | ORAL | Status: DC | PRN
  Administered 2017-11-17 – 2017-11-18 (×3): 650 mg via ORAL
  Filled 2017-11-17 (×4): qty 2

## 2017-11-17 MED ORDER — ENOXAPARIN SODIUM 40 MG/0.4ML ~~LOC~~ SOLN
40.0000 mg | SUBCUTANEOUS | Status: DC
  Filled 2017-11-17: qty 0.4

## 2017-11-17 MED ORDER — ONDANSETRON HCL 4 MG PO TABS: 4.0000 mg | ORAL_TABLET | Freq: Four times a day (QID) | ORAL | Status: DC | PRN

## 2017-11-17 MED ORDER — MELATONIN 5 MG PO TABS
10.0000 mg | ORAL_TABLET | Freq: Every day | ORAL | Status: DC
  Administered 2017-11-17: 10 mg via ORAL
  Filled 2017-11-17 (×2): qty 2

## 2017-11-17 MED ORDER — FINASTERIDE 5 MG PO TABS
5.0000 mg | ORAL_TABLET | Freq: Every day | ORAL | Status: DC
Start: 2017-11-17 — End: 2017-11-18
  Administered 2017-11-17 – 2017-11-18 (×2): 5 mg via ORAL
  Filled 2017-11-17 (×2): qty 1

## 2017-11-17 MED ORDER — METOPROLOL TARTRATE 5 MG/5ML IV SOLN
INTRAVENOUS | Status: AC
  Filled 2017-11-17: qty 5

## 2017-11-17 MED ORDER — LORATADINE 10 MG PO TABS
10.0000 mg | ORAL_TABLET | Freq: Every day | ORAL | Status: DC
  Administered 2017-11-17 – 2017-11-18 (×2): 10 mg via ORAL
  Filled 2017-11-17 (×2): qty 1

## 2017-11-17 MED ORDER — AMITRIPTYLINE HCL 10 MG PO TABS
30.0000 mg | ORAL_TABLET | Freq: Every day | ORAL | Status: DC
  Administered 2017-11-17: 30 mg via ORAL
  Filled 2017-11-17 (×2): qty 3

## 2017-11-17 MED ORDER — MORPHINE SULFATE (PF) 2 MG/ML IV SOLN: 2.0000 mg | INTRAVENOUS | Status: DC | PRN

## 2017-11-17 MED ORDER — NITROGLYCERIN 2 % TD OINT
0.5000 [in_us] | TOPICAL_OINTMENT | Freq: Four times a day (QID) | TRANSDERMAL | Status: DC
  Administered 2017-11-17: 0.5 [in_us] via TOPICAL
  Filled 2017-11-17: qty 1

## 2017-11-17 MED ORDER — LISINOPRIL 10 MG PO TABS
10.0000 mg | ORAL_TABLET | Freq: Every day | ORAL | Status: DC
  Administered 2017-11-17 – 2017-11-18 (×2): 10 mg via ORAL
  Filled 2017-11-17 (×2): qty 1

## 2017-11-17 MED ORDER — SALICYLIC ACID-SULFUR 2-2 % EX SHAM: 1.0000 "application " | MEDICATED_SHAMPOO | Freq: Every day | CUTANEOUS | Status: DC

## 2017-11-17 MED ORDER — PANTOPRAZOLE SODIUM 40 MG PO TBEC
40.0000 mg | DELAYED_RELEASE_TABLET | Freq: Every day | ORAL | Status: DC
  Administered 2017-11-17 – 2017-11-18 (×2): 40 mg via ORAL
  Filled 2017-11-17 (×2): qty 1

## 2017-11-17 MED ORDER — METOPROLOL TARTRATE 5 MG/5ML IV SOLN
5.0000 mg | Freq: Once | INTRAVENOUS | Status: AC
  Administered 2017-11-17: 5 mg via INTRAVENOUS

## 2017-11-17 MED ORDER — BUSPIRONE HCL 5 MG PO TABS
10.0000 mg | ORAL_TABLET | Freq: Two times a day (BID) | ORAL | Status: DC
  Administered 2017-11-17 – 2017-11-18 (×3): 10 mg via ORAL
  Filled 2017-11-17: qty 2
  Filled 2017-11-17: qty 1
  Filled 2017-11-17 (×2): qty 2

## 2017-11-17 MED ORDER — FLUTICASONE PROPIONATE 50 MCG/ACT NA SUSP
2.0000 | Freq: Every day | NASAL | Status: DC
  Administered 2017-11-17 – 2017-11-18 (×2): 2 via NASAL
  Filled 2017-11-17: qty 16

## 2017-11-17 MED ORDER — IPRATROPIUM-ALBUTEROL 0.5-2.5 (3) MG/3ML IN SOLN
3.0000 mL | RESPIRATORY_TRACT | Status: DC | PRN
  Administered 2017-11-17 (×2): 3 mL via RESPIRATORY_TRACT
  Filled 2017-11-17 (×2): qty 3

## 2017-11-17 MED ORDER — ATORVASTATIN CALCIUM 20 MG PO TABS
40.0000 mg | ORAL_TABLET | Freq: Every day | ORAL | Status: DC
  Administered 2017-11-17: 40 mg via ORAL
  Filled 2017-11-17: qty 2

## 2017-11-17 MED ORDER — METOPROLOL TARTRATE 50 MG PO TABS
100.0000 mg | ORAL_TABLET | Freq: Two times a day (BID) | ORAL | Status: DC
  Administered 2017-11-17 – 2017-11-18 (×3): 100 mg via ORAL
  Filled 2017-11-17 (×3): qty 2

## 2017-11-17 MED ORDER — ACETAMINOPHEN 650 MG RE SUPP: 650.0000 mg | Freq: Four times a day (QID) | RECTAL | Status: DC | PRN

## 2017-11-17 MED ORDER — ASPIRIN 81 MG PO CHEW
324.0000 mg | CHEWABLE_TABLET | Freq: Once | ORAL | Status: AC
  Administered 2017-11-17: 324 mg via ORAL
  Filled 2017-11-17: qty 4

## 2017-11-17 MED ORDER — CLOBETASOL PROPIONATE 0.05 % EX CREA
1.0000 "application " | TOPICAL_CREAM | Freq: Every day | CUTANEOUS | Status: DC | PRN
  Filled 2017-11-17: qty 15

## 2017-11-17 MED ORDER — POTASSIUM CHLORIDE CRYS ER 20 MEQ PO TBCR
40.0000 meq | EXTENDED_RELEASE_TABLET | Freq: Once | ORAL | Status: AC
  Administered 2017-11-17: 40 meq via ORAL
  Filled 2017-11-17: qty 2

## 2017-11-17 MED ORDER — ISOSORBIDE MONONITRATE ER 60 MG PO TB24
60.0000 mg | ORAL_TABLET | Freq: Every day | ORAL | Status: DC
  Administered 2017-11-17 – 2017-11-18 (×2): 60 mg via ORAL
  Filled 2017-11-17 (×2): qty 1

## 2017-11-17 MED ORDER — ONDANSETRON HCL 4 MG/2ML IJ SOLN: 4.0000 mg | Freq: Four times a day (QID) | INTRAMUSCULAR | Status: DC | PRN

## 2017-11-17 MED ORDER — DOCUSATE SODIUM 100 MG PO CAPS
100.0000 mg | ORAL_CAPSULE | Freq: Two times a day (BID) | ORAL | Status: DC
  Administered 2017-11-17 – 2017-11-18 (×3): 100 mg via ORAL
  Filled 2017-11-17 (×3): qty 1

## 2017-11-17 MED ORDER — MOMETASONE FURO-FORMOTEROL FUM 200-5 MCG/ACT IN AERO
2.0000 | INHALATION_SPRAY | Freq: Two times a day (BID) | RESPIRATORY_TRACT | Status: DC
  Administered 2017-11-17 – 2017-11-18 (×3): 2 via RESPIRATORY_TRACT
  Filled 2017-11-17: qty 8.8

## 2017-11-17 MED ORDER — POTASSIUM CHLORIDE 20 MEQ PO PACK
40.0000 meq | PACK | Freq: Two times a day (BID) | ORAL | Status: DC
  Administered 2017-11-17 – 2017-11-18 (×3): 40 meq via ORAL
  Filled 2017-11-17 (×3): qty 2

## 2017-11-17 NOTE — Progress Notes (Signed)
Back from echo

## 2017-11-17 NOTE — Consult Note (Signed)
[facility]  CARDIOLOGY CONSULT NOTE  Patient ID: Darius Norris MRN: [id number] DOB/AGE: [birth year] 66 y.o.  Admit date: 11/17/2017 Referring Physician Dr. Anselm Jungling Primary Physician  Primary Cardiologist  Reason for Consultation Dr. Saralyn Pilar  HPI: Patient is a 66 year old male with history of COPD, history of tobacco abuse, history of chest pain with reported coronary artery disease and mild to moderate LV dysfunction who was admitted with chest pain with both typical atypical features.  He has ruled out for myocardial infarction far with serial troponins of 0.03 x 2.  His serum potassium was 2.6 on admission.  Echocardiogram done in 2014 revealed a fixed posterior lateral and inferior wall hypokinesis.  He was treated medically for this.  He had a functional study at that time revealed no significant reversibility.  Patient's pain is resolved.  He is currently treated with metoprolol tartrate 100 mg twice daily, lisinopril 10 mg daily, isosorbide mononitrate 60 mg daily, enteric-coated aspirin, atorvastatin at 40 mg daily.  Pain is resolved.  EKG revealed sinus tachycardia with nonspecific ST-T wave changes.  Review of Systems  Constitutional: Negative.   HENT: Negative.   Eyes: Negative.   Respiratory: Positive for shortness of breath.   Cardiovascular: Positive for chest pain.  Gastrointestinal: Negative.   Genitourinary: Negative.   Musculoskeletal: Negative.   Skin: Negative.   Neurological: Negative.   Endo/Heme/Allergies: Negative.   Psychiatric/Behavioral: Negative.     Past Medical History:  Diagnosis Date  . CAD (coronary artery disease)   . Colitis   . COPD (chronic obstructive pulmonary disease) ([place])   . Emphysema lung ([place])   . GERD (gastroesophageal reflux disease)   . Hypertension   . MI (myocardial infarction) ([place]) 1999    Family History  Problem Relation Age of Onset  . CAD Father   . Stroke Father   .  CAD Sister   . Stroke Brother     Social History   Socioeconomic History  . Marital status: Married    Spouse name: Not on file  . Number of children: Not on file  . Years of education: Not on file  . Highest education level: Not on file  Social Needs  . Financial resource strain: Not on file  . Food insecurity - worry: Not on file  . Food insecurity - inability: Not on file  . Transportation needs - medical: Not on file  . Transportation needs - non-medical: Not on file  Occupational History  . Not on file  Tobacco Use  . Smoking status: Former Research scientist (life sciences)  . Smokeless tobacco: Never Used  Substance and Sexual Activity  . Alcohol use: No  . Drug use: No  . Sexual activity: No  Other Topics Concern  . Not on file  Social History Narrative  . Not on file    Past Surgical History:  Procedure Laterality Date  . ABDOMINAL HERNIA REPAIR Bilateral      Medications Prior to Admission  Medication Sig Dispense Refill Last Dose  . amitriptyline (ELAVIL) 10 MG tablet Take 30 mg by mouth at bedtime.   11/16/2017 at Unknown time  . atorvastatin (LIPITOR) 80 MG tablet Take 40 mg by mouth at bedtime.   11/16/2017 at Unknown time  . budesonide-formoterol (SYMBICORT) 160-4.5 MCG/ACT inhaler Inhale 2 puffs into the lungs 2 (two) times daily.   11/16/2017 at Unknown time  . busPIRone (BUSPAR) 10 MG tablet Take 10 mg by mouth 2 (two) times daily.  11/16/2017 at Unknown time  . clobetasol cream (TEMOVATE) 5.09 % Apply 1 application topically daily as needed. For itching on psoriasis areas   PRN at PRN  . diclofenac sodium (VOLTAREN) 1 % GEL Apply 2 g topically 2 (two) times daily.   11/16/2017 at Unknown time  . finasteride (PROSCAR) 5 MG tablet Take 5 mg by mouth daily.   11/16/2017 at Unknown time  . fluticasone (FLONASE) 50 MCG/ACT nasal spray Place 2 sprays into both nostrils daily.   11/16/2017 at Unknown time  . Ipratropium-Albuterol (COMBIVENT RESPIMAT) 20-100 MCG/ACT AERS respimat Inhale  1 puff into the lungs every 6 (six) hours as needed for wheezing or shortness of breath.   11/16/2017 at Unknown time  . isosorbide mononitrate (IMDUR) 60 MG 24 hr tablet Take 60 mg by mouth daily.   11/16/2017 at Unknown time  . lisinopril (PRINIVIL,ZESTRIL) 20 MG tablet Take 10 mg by mouth daily.   11/16/2017 at Unknown time  . loratadine (CLARITIN) 10 MG tablet Take 10 mg by mouth daily.   11/16/2017 at Unknown time  . metoprolol (LOPRESSOR) 50 MG tablet Take 100 mg by mouth 2 (two) times daily.   11/16/2017 at Unknown time  . omeprazole (PRILOSEC) 20 MG capsule Take 40 mg by mouth daily.   11/16/2017 at Unknown time  . salicyclic acid-sulfur (SEBULEX) 2-2 % shampoo Apply 1 application topically daily.   09/04/2016 at am  . acetaminophen (TYLENOL) 325 MG tablet Take 975 mg by mouth 3 (three) times daily.   09/04/2016 at pm  . furosemide (LASIX) 20 MG tablet Take 1 tablet (20 mg total) by mouth daily. (Patient not taking: Reported on 11/17/2017) 30 tablet 0 Not Taking  . ipratropium (ATROVENT) 0.02 % nebulizer solution Take 2.5 mLs (0.5 mg total) by nebulization every 6 (six) hours as needed for wheezing or shortness of breath. (Patient not taking: Reported on 11/17/2017) 75 mL 12 Not Taking at Unknown time  . levofloxacin (LEVAQUIN) 750 MG tablet Take 1 tablet (750 mg total) by mouth daily at 6 PM. (Patient not taking: Reported on 11/17/2017) 5 tablet 0 Completed Course at Unknown time  . levofloxacin (LEVAQUIN) 750 MG tablet Take 1 tablet (750 mg total) by mouth daily. (Patient not taking: Reported on 11/17/2017) 4 tablet 0 Completed Course at Unknown time  . predniSONE (DELTASONE) 20 MG tablet Take 2 tablets (40 mg total) by mouth daily. (Patient not taking: Reported on 11/17/2017) 8 tablet 0 Completed Course at Unknown time  . predniSONE (STERAPRED UNI-PAK 21 TAB) 10 MG (21) TBPK tablet Take 1 tablet (10 mg total) by mouth daily. Taper as prescribed (Patient not taking: Reported on 11/17/2017) 21 tablet 0  Completed Course at Unknown time    Physical Exam: Blood pressure (!) 163/82, pulse 73, temperature 97.6 F (36.4 C), temperature source Oral, resp. rate 16, height 5\' 6"  (1.676 m), weight 95.3 kg (210 lb), SpO2 96 %.   Wt Readings from Last 1 Encounters:  11/17/17 95.3 kg (210 lb)     General appearance: alert and cooperative Resp: clear to auscultation bilaterally Cardio: regular rate and rhythm GI: soft, non-tender; bowel sounds normal; no masses,  no organomegaly Extremities: extremities normal, atraumatic, no cyanosis or edema Neurologic: Grossly normal  Labs:   Lab Results  Component Value Date   WBC 8.8 11/17/2017   HGB 16.4 11/17/2017   HCT 49.7 11/17/2017   MCV 86.3 11/17/2017   PLT 218 11/17/2017    Recent Labs  Lab 11/17/17 0533  NA 140  K 2.6*  CL 99*  CO2 29  BUN 7  CREATININE 0.99  CALCIUM 9.5  GLUCOSE 189*   Lab Results  Component Value Date   CKTOTAL 74 02/05/2013   CKMB 0.9 02/05/2013   TROPONINI <0.03 11/17/2017      Radiology: Chest x-ray revealed emphysema which is stable from previously.  No pulmonary edema EKG: Sinus rhythm sinus tachycardia with nonspecific ST-T wave changes.  ASSESSMENT AND PLAN: Patient is a 66 year old male with apparent history of coronary disease in the past with evidence of mild inferior ischemia on a functional study in 2014.  This was treated medically.  He also had a reported ejection fraction of approximately 37% at that time.  He is admitted with chest pain with typical atypical features.  Initial serum troponin 0.03 x 2.  He has improved clinically.  We will proceed with an echocardiogram to evaluate LV function.  We will follow-up from further cardiac markers.  Should they increase or the patient develop further symptoms, further evaluation including invasive versus noninvasive workup could be indicated.  We will continue with current regimens as listed in the HPI. Signed: Teodoro Spray MD, [facility] 11/17/2017,  10:05 AM

## 2017-11-17 NOTE — Progress Notes (Signed)
89SU wm admitted from ED with chest pain.  No distress on 2LO2 per [place].  Cardiac monitored on pt and verified, denies chest pain at this time.  SL rt ac flushes well.  Skin with patches of psoriasis noted, checked with Lucina Mellow.  Oriented to room and surroundings, POC reviewed with pt and wife.  Denies need. CB in reach, SR up x 2.

## 2017-11-17 NOTE — Progress Notes (Signed)
Pt. Request melatonin or sleep. Dr. Posey Pronto paged new order for melatonin.

## 2017-11-17 NOTE — Progress Notes (Signed)
CRITICAL VALUE ALERT  Critical Value: troponin 0.34   Date & Time Notied: 12/01/20018 1010   Provider Notified: Dr. Ubaldo Glassing  Orders Received/Actions taken: none

## 2017-11-17 NOTE — H&P (Signed)
Darius Norris is an 66 y.o. male.   Chief Complaint: Chest pain HPI: The patient with past medical history of three-vessel CAD status post myocardial infarction that has been medically managed as well as COPD and hypertension presents to the emergency department complaining of chest pain.  The pain awoke him from sleep and was a heavy feeling of pressure in his central chest.  The pain radiated to his back and the patient admits to feeling nausea but denies vomiting or diaphoresis.  His pain is eased off in the emergency department after aspirin.  Patient's blood pressure was found to be very elevated as well.  Initial troponin is negative.  Due to the patient's comorbidities as well as ongoing pain and known heart disease the emergency department staff called the hospitalist service for admission.  Past Medical History:  Diagnosis Date  . CAD (coronary artery disease)   . Colitis   . COPD (chronic obstructive pulmonary disease) ([place])   . Emphysema lung ([place])   . GERD (gastroesophageal reflux disease)   . Hypertension   . MI (myocardial infarction) ([place]) 1999    Past Surgical History:  Procedure Laterality Date  . ABDOMINAL HERNIA REPAIR Bilateral     Family History  Problem Relation Age of Onset  . CAD Father   . Stroke Father   . CAD Sister   . Stroke Brother    Social History:  reports that he has quit smoking. he has never used smokeless tobacco. He reports that he does not drink alcohol or use drugs.  Allergies:  Allergies  Allergen Reactions  . Zantac [Ranitidine Hcl] Shortness Of Breath  . Codeine Itching  . Erythromycin Hives  . Vicodin [Hydrocodone-Acetaminophen] Rash    Prior to Admission medications   Medication Sig Start Date End Date Taking? Authorizing Provider  amitriptyline (ELAVIL) 10 MG tablet Take 30 mg by mouth at bedtime.   Yes [provider]  atorvastatin (LIPITOR) 80 MG tablet Take 40 mg by mouth at bedtime.   Yes [provider]   budesonide-formoterol (SYMBICORT) 160-4.5 MCG/ACT inhaler Inhale 2 puffs into the lungs 2 (two) times daily.   Yes [provider]  busPIRone (BUSPAR) 10 MG tablet Take 10 mg by mouth 2 (two) times daily.   Yes [provider]  clobetasol cream (TEMOVATE) 7.68 % Apply 1 application topically daily as needed. For itching on psoriasis areas   Yes [provider]  diclofenac sodium (VOLTAREN) 1 % GEL Apply 2 g topically 2 (two) times daily.   Yes [provider]  finasteride (PROSCAR) 5 MG tablet Take 5 mg by mouth daily.   Yes [provider]  fluticasone (FLONASE) 50 MCG/ACT nasal spray Place 2 sprays into both nostrils daily.   Yes [provider]  Ipratropium-Albuterol (COMBIVENT RESPIMAT) 20-100 MCG/ACT AERS respimat Inhale 1 puff into the lungs every 6 (six) hours as needed for wheezing or shortness of breath.   Yes [provider]  isosorbide mononitrate (IMDUR) 60 MG 24 hr tablet Take 60 mg by mouth daily.   Yes [provider]  lisinopril (PRINIVIL,ZESTRIL) 20 MG tablet Take 10 mg by mouth daily.   Yes [provider]  loratadine (CLARITIN) 10 MG tablet Take 10 mg by mouth daily.   Yes [provider]  metoprolol (LOPRESSOR) 50 MG tablet Take 100 mg by mouth 2 (two) times daily.   Yes [provider]  omeprazole (PRILOSEC) 20 MG capsule Take 40 mg by mouth daily.  Yes [provider]  salicyclic acid-sulfur (SEBULEX) 2-2 % shampoo Apply 1 application topically daily.   Yes [provider]  acetaminophen (TYLENOL) 325 MG tablet Take 975 mg by mouth 3 (three) times daily.    [provider]  furosemide (LASIX) 20 MG tablet Take 1 tablet (20 mg total) by mouth daily. Patient not taking: Reported on 11/17/2017 09/05/16   Epifanio Lesches, MD  ipratropium (ATROVENT) 0.02 % nebulizer solution Take 2.5 mLs (0.5 mg total) by nebulization every 6 (six) hours as needed for  wheezing or shortness of breath. Patient not taking: Reported on 11/17/2017 09/05/16   Epifanio Lesches, MD  levofloxacin (LEVAQUIN) 750 MG tablet Take 1 tablet (750 mg total) by mouth daily at 6 PM. Patient not taking: Reported on 11/17/2017 09/05/16   Epifanio Lesches, MD  levofloxacin (LEVAQUIN) 750 MG tablet Take 1 tablet (750 mg total) by mouth daily. Patient not taking: Reported on 11/17/2017 09/05/16   Epifanio Lesches, MD  predniSONE (DELTASONE) 20 MG tablet Take 2 tablets (40 mg total) by mouth daily. Patient not taking: Reported on 11/17/2017 09/22/17   Nance Pear, MD  predniSONE (STERAPRED UNI-PAK 21 TAB) 10 MG (21) TBPK tablet Take 1 tablet (10 mg total) by mouth daily. Taper as prescribed Patient not taking: Reported on 11/17/2017 09/05/16   Epifanio Lesches, MD     Results for orders placed or performed during the hospital encounter of 11/17/17 (from the past 48 hour(s))  Basic metabolic panel     Status: Abnormal   Collection Time: 11/17/17  5:33 AM  Result Value Ref Range   Sodium 140 135 - 145 mmol/L   Potassium 2.6 (LL) 3.5 - 5.1 mmol/L    Comment: CRITICAL RESULT CALLED TO, READ BACK BY AND VERIFIED WITH KENNY PATEL AT 0604 11/17/17.PMH   Chloride 99 (L) 101 - 111 mmol/L   CO2 29 22 - 32 mmol/L   Glucose, Bld 189 (H) 65 - 99 mg/dL   BUN 7 6 - 20 mg/dL   Creatinine, Ser 0.99 0.61 - 1.24 mg/dL   Calcium 9.5 8.9 - 10.3 mg/dL   GFR calc non Af Amer >60 >60 mL/min   GFR calc Af Amer >60 >60 mL/min    Comment: (NOTE) The eGFR has been calculated using the CKD EPI equation. This calculation has not been validated in all clinical situations. eGFR's persistently <60 mL/min signify possible Chronic Kidney Disease.    Anion gap 12 5 - 15  CBC     Status: Abnormal   Collection Time: 11/17/17  5:33 AM  Result Value Ref Range   WBC 8.8 3.8 - 10.6 K/uL   RBC 5.76 4.40 - 5.90 MIL/uL   Hemoglobin 16.4 13.0 - 18.0 g/dL   HCT 49.7 40.0 - 52.0 %   MCV 86.3 80.0 -  100.0 fL   MCH 28.5 26.0 - 34.0 pg   MCHC 33.0 32.0 - 36.0 g/dL   RDW 16.6 (H) 11.5 - 14.5 %   Platelets 218 150 - 440 K/uL  Troponin I     Status: None   Collection Time: 11/17/17  5:33 AM  Result Value Ref Range   Troponin I <0.03 <0.03 ng/mL  Glucose, capillary     Status: Abnormal   Collection Time: 11/17/17  5:38 AM  Result Value Ref Range   Glucose-Capillary 197 (H) 65 - 99 mg/dL   Dg Chest Port 1 View  Result Date: 11/17/2017 CLINICAL DATA:  Initial evaluation for acute chest pain. EXAM: PORTABLE  CHEST 1 VIEW COMPARISON:  Prior radiograph from 09/22/2017. FINDINGS: The cardiac and mediastinal silhouettes are stable in size and contour, and remain within normal limits. The lungs are hyperinflated. Attenuation of the pulmonary markings at the upper lungs consistent with emphysema. Bibasilar interstitial prominence is stable from previous. No focal infiltrates. No pulmonary edema or pleural effusion. No pneumothorax. No acute osseus abnormality. IMPRESSION: 1. Hyperinflation with underlying emphysema, stable from previous. 2. No other active cardiopulmonary disease. Electronically Signed   By: Jeannine Boga M.D.   On: 11/17/2017 06:12    Review of Systems  Constitutional: Negative for chills and fever.  HENT: Negative for sore throat and tinnitus.   Eyes: Negative for blurred vision and redness.  Respiratory: Positive for shortness of breath (acute on chronic). Negative for cough.   Cardiovascular: Positive for chest pain. Negative for palpitations, orthopnea and PND.  Gastrointestinal: Negative for abdominal pain, diarrhea, nausea and vomiting.  Genitourinary: Negative for dysuria, frequency and urgency.  Musculoskeletal: Negative for joint pain and myalgias.  Skin: Negative for rash.       No lesions  Neurological: Negative for speech change, focal weakness and weakness.  Endo/Heme/Allergies: Does not bruise/bleed easily.       No temperature intolerance   Psychiatric/Behavioral: Negative for depression and suicidal ideas.    Blood pressure (!) 160/117, pulse (!) 147, temperature 97.6 F (36.4 C), temperature source Oral, resp. rate (!) 23, height _0  (1.676 m), weight 95.3 kg (210 lb), SpO2 97 %. Physical Exam  Vitals reviewed. Constitutional: He is oriented to person, place, and time. He appears well-developed and well-nourished. No distress.  HENT:  Head: Normocephalic and atraumatic.  Mouth/Throat: Oropharynx is clear and moist.  Eyes: Conjunctivae and EOM are normal. Pupils are equal, round, and reactive to light. No scleral icterus.  Neck: Normal range of motion. Neck supple. No JVD present. No tracheal deviation present. No thyromegaly present.  Cardiovascular: Normal rate, regular rhythm and normal heart sounds.  Respiratory: Effort normal and breath sounds normal. No respiratory distress.  GI: Soft. Bowel sounds are normal. He exhibits no distension. There is no tenderness.  Genitourinary:  Genitourinary Comments: Deferred  Musculoskeletal: Normal range of motion. He exhibits no edema.  Lymphadenopathy:    He has no cervical adenopathy.  Neurological: He is alert and oriented to person, place, and time. No cranial nerve deficit.  Skin: Skin is warm and dry. No rash noted. There is erythema (hands bilaterally). Nails show clubbing.  Psychiatric: He has a normal mood and affect. His behavior is normal. Judgment and thought content normal.     Assessment/Plan This is a 66 year old male admitted for unstable angina. 1.  Unstable angina: Chest pain is resolving.  Continue to follow cardiac biomarkers.  Monitor telemetry.  Manage blood pressure and supply supplemental oxygen as needed.  Cardiology consult placed.  Continue Lasix per home regimen is apparently the patient had some degree of congestive heart failure but is not and overt exacerbation at this time.  2.  Hypertension: Uncontrolled; placed Nitropaste on the patient's  chest.  Metoprolol and lisinopril. 3.  Hypokalemia: Replete potassium. 4.  COPD: Continue inhaled corticosteroid.  Duo nebs as needed. 5.  Hyperlipidemia: Continue statin therapy 6.  DVT prophylaxis: Lovenox 7.  GI prophylaxis: PPI per home regimen The patient is a full code.  Time spent on admission orders and patient care approximately 45 minutes  Harrie Foreman, MD 11/17/2017, 7:10 AM

## 2017-11-17 NOTE — Progress Notes (Signed)
CRITICAL VALUE ALERT  Critical Value:  Troponin 0.59    Date & Time Notied:    Provider Notified: Dr. Ubaldo Glassing Orders Received/Actions taken: no orders received

## 2017-11-17 NOTE — ED Triage Notes (Signed)
Pt presents to ED 12 via EMS from home c/o central chest radiating to the back, pain is 5/10; pt has hx of MI, COPD; pt is awake, alert and oriented x4; pt states chest pain was present when he woke up around 0330; pt states pain is dull, aching, pressure.

## 2017-11-17 NOTE — Progress Notes (Signed)
[facility] [place] NAME: Darius Norris    MR#:  [id number]  DATE OF BIRTH:  27-Mar-1951  SUBJECTIVE:  CHIEF COMPLAINT:   Chief Complaint  Patient presents with  . Chest Pain     Came with chest pain, known hx of CAD- not much pain now.  REVIEW OF SYSTEMS:  CONSTITUTIONAL: No fever, fatigue or weakness.  EYES: No blurred or double vision.  EARS, NOSE, AND THROAT: No tinnitus or ear pain.  RESPIRATORY: No cough, shortness of breath, wheezing or hemoptysis.  CARDIOVASCULAR: No chest pain, orthopnea, edema.  GASTROINTESTINAL: No nausea, vomiting, diarrhea or abdominal pain.  GENITOURINARY: No dysuria, hematuria.  ENDOCRINE: No polyuria, nocturia,  HEMATOLOGY: No anemia, easy bruising or bleeding SKIN: No rash or lesion. MUSCULOSKELETAL: No joint pain or arthritis.   NEUROLOGIC: No tingling, numbness, weakness.  PSYCHIATRY: No anxiety or depression.   ROS  DRUG ALLERGIES:   Allergies  Allergen Reactions  . Zantac [Ranitidine Hcl] Shortness Of Breath  . Codeine Itching  . Erythromycin Hives  . Vicodin [Hydrocodone-Acetaminophen] Rash    VITALS:  Blood pressure (!) 163/82, pulse 73, temperature (!) 97.5 F (36.4 C), resp. rate 16, height 5\' 6"  (1.676 m), weight 91.7 kg (202 lb 3.2 oz), SpO2 96 %.  PHYSICAL EXAMINATION:  GENERAL:  66 y.o.-year-old patient lying in the bed with no acute distress.  EYES: Pupils equal, round, reactive to light and accommodation. No scleral icterus. Extraocular muscles intact.  HEENT: Head atraumatic, normocephalic. Oropharynx and nasopharynx clear.  NECK:  Supple, no jugular venous distention. No thyroid enlargement, no tenderness.  LUNGS: Normal breath sounds bilaterally, no wheezing, rales,rhonchi or crepitation. No use of accessory muscles of respiration.  CARDIOVASCULAR: S1, S2 normal. No murmurs, rubs, or gallops.  ABDOMEN: Soft, nontender, nondistended. Bowel sounds present. No organomegaly or  mass.  EXTREMITIES: No pedal edema, cyanosis, or clubbing.  NEUROLOGIC: Cranial nerves II through XII are intact. Muscle strength 5/5 in all extremities. Sensation intact. Gait not checked.  PSYCHIATRIC: The patient is alert and oriented x 3.  SKIN: No obvious rash, lesion, or ulcer.   Physical Exam LABORATORY PANEL:   CBC Recent Labs  Lab 11/17/17 0533  WBC 8.8  HGB 16.4  HCT 49.7  PLT 218   ------------------------------------------------------------------------------------------------------------------  Chemistries  Recent Labs  Lab 11/17/17 0533  NA 140  K 2.6*  CL 99*  CO2 29  GLUCOSE 189*  BUN 7  CREATININE 0.99  CALCIUM 9.5   ------------------------------------------------------------------------------------------------------------------  Cardiac Enzymes Recent Labs  Lab 11/17/17 0533 11/17/17 0841  TROPONINI <0.03 0.34*   ------------------------------------------------------------------------------------------------------------------  RADIOLOGY:  Dg Chest Port 1 View  Result Date: 11/17/2017 CLINICAL DATA:  Initial evaluation for acute chest pain. EXAM: PORTABLE CHEST 1 VIEW COMPARISON:  Prior radiograph from 09/22/2017. FINDINGS: The cardiac and mediastinal silhouettes are stable in size and contour, and remain within normal limits. The lungs are hyperinflated. Attenuation of the pulmonary markings at the upper lungs consistent with emphysema. Bibasilar interstitial prominence is stable from previous. No focal infiltrates. No pulmonary edema or pleural effusion. No pneumothorax. No acute osseus abnormality. IMPRESSION: 1. Hyperinflation with underlying emphysema, stable from previous. 2. No other active cardiopulmonary disease. Electronically Signed   By: Jeannine Boga M.D.   On: 11/17/2017 06:12    ASSESSMENT AND PLAN:   Active Problems:   Chest pain   1.  Unstable angina: Chest pain is resolving.  Continue to follow cardiac biomarkers.   Monitor telemetry.  Manage blood pressure and supply supplemental oxygen as needed.  Cardiology consult placed.  Continue Lasix per home regimen is apparently the patient had some degree of congestive heart failure but is not and overt exacerbation at this time.   second troponin is slightly high, monitor. If rise further- may need to call cardiology. 2.  Hypertension: Uncontrolled; placed Nitropaste on the patient's chest.  Metoprolol and lisinopril. Stopped nitropaste as pt is on Imdur. 3.  Hypokalemia: Replete potassium. 4.  COPD: Continue inhaled corticosteroid.  Duo nebs as needed. 5.  Hyperlipidemia: Continue statin therapy 6.  DVT prophylaxis: Lovenox 7.  GI prophylaxis: PPI per home regimen    All the records are reviewed and case discussed with Care Management/Social Workerr. Management plans discussed with the patient, family and they are in agreement.  CODE STATUS: Full.  TOTAL TIME TAKING CARE OF THIS PATIENT: 35 minutes.     POSSIBLE D/C IN 1 DAYS, DEPENDING ON CLINICAL CONDITION.   Vaughan Basta M.D on 11/17/2017   Between 7am to 6pm - Pager - [phone]  After 6pm go to [email or web address] - password [email or web address] [facility]  Office  [phone]  CC: Primary care physician; Patient, No Pcp Per  Note: This dictation was prepared with Dragon dictation along with smaller phrase technology. Any transcriptional errors that result from this process are unintentional.

## 2017-11-17 NOTE — Plan of Care (Signed)
  Progressing Education: Knowledge of General Education information will improve 11/17/2017 1105 - Progressing by Etheleen Nicks, RN Health Behavior/Discharge Planning: Ability to manage health-related needs will improve 11/17/2017 1105 - Progressing by Etheleen Nicks, RN Education: Understanding of cardiac disease, CV risk reduction, and recovery process will improve 11/17/2017 1105 - Progressing by Etheleen Nicks, RN   Not Progressing Clinical Measurements: Diagnostic test results will improve 11/17/2017 1105 - Not Progressing by Etheleen Nicks, RN Note Second troponin 0.34, K 2.6, replacing his potassium Cardiac: Ability to achieve and maintain adequate cardiovascular perfusion will improve 11/17/2017 1105 - Not Progressing by Etheleen Nicks, RN Note Troponin trending up, echocardiogram ordered

## 2017-11-17 NOTE — Progress Notes (Signed)
To echocardiogram via bed

## 2017-11-18 LAB — BASIC METABOLIC PANEL
Anion gap: 8 (ref 5–15)
BUN: 7 mg/dL (ref 6–20)
CALCIUM: 9.3 mg/dL (ref 8.9–10.3)
CHLORIDE: 100 mmol/L — AB (ref 101–111)
CO2: 29 mmol/L (ref 22–32)
CREATININE: 0.84 mg/dL (ref 0.61–1.24)
GFR calc Af Amer: >60 mL/min (ref >60)
GFR calc non Af Amer: >60 mL/min (ref >60)
GLUCOSE: 140 mg/dL — AB (ref 65–99)
Potassium: 3.6 mmol/L (ref 3.5–5.1)
Sodium: 137 mmol/L (ref 135–145)

## 2017-11-18 LAB — ECHOCARDIOGRAM COMPLETE
Area-P 1/2: 2.62 cm2
EERAT: 8.81
EWDT: 285 ms
FS: 27 % — AB (ref 28–44)
HEIGHTINCHES: 66 in
IVS/LV PW RATIO, ED: 1.42
LA ID, A-P, ES: 35 mm
LA vol A4C: 47.2 ml
LA vol index: 34.9 mL/m2
LADIAMINDEX: 1.67 cm/m2
LAVOL: 73.3 mL
LEFT ATRIUM END SYS DIAM: 35 mm
LV E/e' medial: 8.81
LV E/e'average: 8.81
LV PW d: 10.1 mm — AB (ref 0.6–1.1)
LVELAT: 7.3 cm/s
MV Dec: 285
MVPKAVEL: 39.4 m/s
MVPKEVEL: 64.3 m/s
MVSPHT: 84 ms
RV LATERAL S' VELOCITY: 12.9 cm/s
RV TAPSE: 22.4 mm
TDI e' lateral: 7.3
TDI e' medial: 4.95
WEIGHTICAEL: 3235.2 [oz_av]

## 2017-11-18 LAB — MAGNESIUM: Magnesium: 2 mg/dL (ref 1.7–2.4)

## 2017-11-18 MED ORDER — METOPROLOL TARTRATE 100 MG PO TABS: 100.0000 mg | ORAL_TABLET | Freq: Two times a day (BID) | ORAL | 0 refills | Status: DC

## 2017-11-18 MED ORDER — ATORVASTATIN CALCIUM 80 MG PO TABS: 80.0000 mg | ORAL_TABLET | Freq: Every day | ORAL | 0 refills | Status: DC

## 2017-11-18 NOTE — Plan of Care (Signed)
  Adequate for Discharge Education: Knowledge of General Education information will improve 11/18/2017 1238 - Adequate for Discharge by Feliberto Gottron, [place] Behavior/Discharge Planning: Ability to manage health-related needs will improve 11/18/2017 1238 - Adequate for Discharge by Feliberto Gottron, RN Clinical Measurements: Ability to maintain clinical measurements within normal limits will improve 11/18/2017 1238 - Adequate for Discharge by Feliberto Gottron, RN Diagnostic test results will improve 11/18/2017 1238 - Adequate for Discharge by Feliberto Gottron, RN Respiratory complications will improve 11/18/2017 1238 - Adequate for Discharge by Feliberto Gottron, RN Cardiovascular complication will be avoided 11/18/2017 1238 - Adequate for Discharge by Chriss Czar, Illene Bolus, RN Activity: Risk for activity intolerance will decrease 11/18/2017 1238 - Adequate for Discharge by Feliberto Gottron, RN Nutrition: Adequate nutrition will be maintained 11/18/2017 1238 - Adequate for Discharge by Feliberto Gottron, RN Coping: Level of anxiety will decrease 11/18/2017 1238 - Adequate for Discharge by Chriss Czar, Illene Bolus, RN Elimination: Will not experience complications related to bowel motility 11/18/2017 1238 - Adequate for Discharge by Chriss Czar, Illene Bolus, RN Will not experience complications related to urinary retention 11/18/2017 1238 - Adequate for Discharge by Feliberto Gottron, RN Pain Managment: General experience of comfort will improve 11/18/2017 1238 - Adequate for Discharge by Feliberto Gottron, RN Safety: Ability to remain free from injury will improve 11/18/2017 1238 - Adequate for Discharge by Chriss Czar, Illene Bolus, RN Skin Integrity: Risk for impaired skin integrity will decrease 11/18/2017 1238 - Adequate for Discharge by Feliberto Gottron, RN Education: Understanding of cardiac disease,  CV risk reduction, and recovery process will improve 11/18/2017 1238 - Adequate for Discharge by Feliberto Gottron, RN Activity: Ability to tolerate increased activity will improve 11/18/2017 1238 - Adequate for Discharge by Feliberto Gottron, RN Cardiac: Ability to achieve and maintain adequate cardiovascular perfusion will improve 11/18/2017 1238 - Adequate for Discharge by Feliberto Gottron, RN Health Behavior/Discharge Planning: Ability to safely manage health-related needs after discharge will improve 11/18/2017 1238 - Adequate for Discharge by Feliberto Gottron, RN

## 2017-11-18 NOTE — Progress Notes (Signed)
[facility]  SUBJECTIVE: No further chest pain.   Vitals:   11/17/17 1951 11/17/17 2001 11/18/17 0347 11/18/17 0820  BP: (!) 163/76  (!) 153/81 (!) 165/78  Pulse: 71  63 66  Resp: 18  17 16   Temp: 98 F (36.7 C)  (!) 97.4 F (36.3 C)   TempSrc: Oral  Oral   SpO2: 95% 96% 96% 94%  Weight:   91.9 kg (202 lb 9.6 oz)   Height:        Intake/Output Summary (Last 24 hours) at 11/18/2017 0931 Last data filed at 11/18/2017 0327 Gross per 24 hour  Intake 240 ml  Output 450 ml  Net -210 ml    LABS: Basic Metabolic Panel: Recent Labs    11/17/17 0533 11/17/17 1400 11/18/17 0435  NA 140  --  137  K 2.6*  --  3.6  CL 99*  --  100*  CO2 29  --  29  GLUCOSE 189*  --  140*  BUN 7  --  7  CREATININE 0.99  --  0.84  CALCIUM 9.5  --  9.3  MG  --  2.2 2.0   Liver Function Tests: No results for input(s): AST, ALT, ALKPHOS, BILITOT, PROT, ALBUMIN in the last 72 hours. No results for input(s): LIPASE, AMYLASE in the last 72 hours. CBC: Recent Labs    11/17/17 0533  WBC 8.8  HGB 16.4  HCT 49.7  MCV 86.3  PLT 218   Cardiac Enzymes: Recent Labs    11/17/17 0841 11/17/17 1400 11/17/17 2026  TROPONINI 0.34* 0.59* 0.48*   BNP: Invalid input(s): POCBNP D-Dimer: No results for input(s): DDIMER in the last 72 hours. Hemoglobin A1C: No results for input(s): HGBA1C in the last 72 hours. Fasting Lipid Panel: No results for input(s): CHOL, HDL, LDLCALC, TRIG, CHOLHDL, LDLDIRECT in the last 72 hours. Thyroid Function Tests: Recent Labs    11/17/17 0841  TSH 2.210   Anemia Panel: No results for input(s): VITAMINB12, FOLATE, FERRITIN, TIBC, IRON, RETICCTPCT in the last 72 hours.   Physical Exam: Blood pressure (!) 165/78, pulse 66, temperature (!) 97.4 F (36.3 C), temperature source Oral, resp. rate 16, height 5\' 6"  (1.676 m), weight 91.9 kg (202 lb 9.6 oz), SpO2 94 %.   Wt Readings from Last 1 Encounters:  11/18/17  91.9 kg (202 lb 9.6 oz)     General appearance: alert and cooperative Resp: clear to auscultation bilaterally Cardio: regular rate and rhythm GI: soft, non-tender; bowel sounds normal; no masses,  no organomegaly Extremities: extremities normal, atraumatic, no cyanosis or edema Neurologic: Grossly normal  TELEMETRY: Reviewed telemetry pt in normal sinus rhythm:  ASSESSMENT AND PLAN:  Active Problems:   Chest pain-had a mild troponin elevation.  States the pain was different than his angina and location, quality.  He states he had a extremely stressful interaction with a vendor in his house causing him a lot of mental anguish.  He feels that the discomfort that happened after this event.  He has had no further chest pain.  Prior to and since the event he has had no exertional chest pain.  We discussed consideration for left heart cath versus continued merit medical therapy with close follow up as an outpatient.  He prefers the latter.  Will continue with isosorbide mononitrate at current dose of 60 mg daily.  Patient had been taking 100 mg of metoprolol tartrate in the morning and 50 mg  at night.  Will change this to 100 mg twice daily.  Continue with aspirin and Lipitor at 80 mg daily.  Ambulate this morning follow-up next week at his outpatient cardiology office if he has no further symptoms.  We will continue with lisinopril for blood pressure.    Teodoro Spray, MD, [facility] 11/18/2017 9:31 AM

## 2017-11-18 NOTE — Progress Notes (Signed)
Discharge instructions given. Wife at bedside. Both verbalized understanding with no further questions. IV out and tele off. Pt transported via wheelchair.

## 2017-11-18 NOTE — Progress Notes (Signed)
Pt. Only slept about an hour throughout the night. Had c/o pain to lower back this morning, pt. Was medicated with effective results. No c/o SOB or acute distress noted. Pt. Wore oxygen PRN throughout the night.

## 2017-11-19 LAB — MISC LABCORP TEST (SEND OUT): Labcorp test code: 1453

## 2017-11-20 NOTE — Discharge Summary (Signed)
[facility] [place] NAME: Darius Norris    MR#:  [id number]  DATE OF BIRTH:  09-21-1951  DATE OF ADMISSION:  11/17/2017 ADMITTING PHYSICIAN: Harrie Foreman, MD  DATE OF DISCHARGE: 11/18/2017  1:35 PM  PRIMARY CARE PHYSICIAN: Darius Norris, No Pcp Per    ADMISSION DIAGNOSIS:  Hypokalemia [E87.6] Chest pain, unspecified type [R07.9]  DISCHARGE DIAGNOSIS:  Active Problems:   Chest pain   SECONDARY DIAGNOSIS:   Past Medical History:  Diagnosis Date  . CAD (coronary artery disease)   . Colitis   . COPD (chronic obstructive pulmonary disease) ([place])   . Emphysema lung ([place])   . GERD (gastroesophageal reflux disease)   . Hypertension   . MI (myocardial infarction) ([place]) [place] COURSE:   1. Unstable angina: Chest pain is resolving. Continue to follow cardiac biomarkers. Monitor telemetry. Manage blood pressure and supply supplemental oxygen as needed. Cardiology consult placed. Continue Lasix per home regimen is apparently the Darius Norris had some degree of congestive heart failure but is not and overt exacerbation at this time.  second troponin is slightly high, monitor.  Cardio consult called in- Advised to cont Imdur ad increase metoprolol to 100 mg BID. Felt better. 2. Hypertension: Uncontrolled; placed Nitropaste on the Darius Norris'schest. Metoprolol and lisinopril. Stopped nitropaste as pt is on Imdur. Increased metoprolol on d/c. 3. Hypokalemia: Replete potassium. 4. COPD: Continue inhaled corticosteroid. Duo nebs as needed. 5. Hyperlipidemia: Continue statin therapy 6. DVT prophylaxis: Lovenox 7. GI prophylaxis: PPI per home regimen    DISCHARGE CONDITIONS:   Stable.  CONSULTS OBTAINED:  Treatment Team:  Teodoro Spray, MD  DRUG ALLERGIES:   Allergies  Allergen Reactions  . Zantac [Ranitidine Hcl] Shortness Of Breath  . Codeine Itching  . Erythromycin Hives  . Vicodin  [Hydrocodone-Acetaminophen] Rash    DISCHARGE MEDICATIONS:   Allergies as of 11/18/2017      Reactions   Zantac [ranitidine Hcl] Shortness Of Breath   Codeine Itching   Erythromycin Hives   Vicodin [hydrocodone-acetaminophen] Rash      Medication List    STOP taking these medications   furosemide 20 MG tablet Commonly known as:  LASIX   ipratropium 0.02 % nebulizer solution Commonly known as:  ATROVENT   levofloxacin 750 MG tablet Commonly known as:  LEVAQUIN   predniSONE 10 MG (21) Tbpk tablet Commonly known as:  STERAPRED UNI-PAK 21 TAB   predniSONE 20 MG tablet Commonly known as:  DELTASONE     TAKE these medications   acetaminophen 325 MG tablet Commonly known as:  TYLENOL Take 975 mg by mouth 3 (three) times daily.   amitriptyline 10 MG tablet Commonly known as:  ELAVIL Take 30 mg by mouth at bedtime.   atorvastatin 80 MG tablet Commonly known as:  LIPITOR Take 1 tablet (80 mg total) by mouth at bedtime. What changed:  how much to take   budesonide-formoterol 160-4.5 MCG/ACT inhaler Commonly known as:  SYMBICORT Inhale 2 puffs into the lungs 2 (two) times daily.   busPIRone 10 MG tablet Commonly known as:  BUSPAR Take 10 mg by mouth 2 (two) times daily.   clobetasol cream 0.05 % Commonly known as:  TEMOVATE Apply 1 application topically daily as needed. For itching on psoriasis areas   COMBIVENT RESPIMAT 20-100 MCG/ACT Aers respimat Generic drug:  Ipratropium-Albuterol Inhale 1 puff into the lungs every 6 (six) hours as needed for wheezing or shortness of breath.  diclofenac sodium 1 % Gel Commonly known as:  VOLTAREN Apply 2 g topically 2 (two) times daily.   finasteride 5 MG tablet Commonly known as:  PROSCAR Take 5 mg by mouth daily.   fluticasone 50 MCG/ACT nasal spray Commonly known as:  FLONASE Place 2 sprays into both nostrils daily.   isosorbide mononitrate 60 MG 24 hr tablet Commonly known as:  IMDUR Take 60 mg by mouth  daily.   lisinopril 20 MG tablet Commonly known as:  PRINIVIL,ZESTRIL Take 10 mg by mouth daily.   loratadine 10 MG tablet Commonly known as:  CLARITIN Take 10 mg by mouth daily.   metoprolol tartrate 100 MG tablet Commonly known as:  LOPRESSOR Take 1 tablet (100 mg total) by mouth 2 (two) times daily. What changed:  medication strength   omeprazole 20 MG capsule Commonly known as:  PRILOSEC Take 40 mg by mouth daily.   salicyclic acid-sulfur 2-2 % shampoo Commonly known as:  SEBULEX Apply 1 application topically daily.        DISCHARGE INSTRUCTIONS:    Follow with Cardio clinic in 1-2 weeks.  If you experience worsening of your admission symptoms, develop shortness of breath, life threatening emergency, suicidal or homicidal thoughts you must seek medical attention immediately by calling 911 or calling your MD immediately  if symptoms less severe.  You Must read complete instructions/literature along with all the possible adverse reactions/side effects for all the Medicines you take and that have been prescribed to you. Take any new Medicines after you have completely understood and accept all the possible adverse reactions/side effects.   Please note  You were cared for by a hospitalist during your hospital stay. If you have any questions about your discharge medications or the care you received while you were in the hospital after you are discharged, you can call the unit and asked to speak with the hospitalist on call if the hospitalist that took care of you is not available. Once you are discharged, your primary care physician will handle any further medical issues. Please note that NO REFILLS for any discharge medications will be authorized once you are discharged, as it is imperative that you return to your primary care physician (or establish a relationship with a primary care physician if you do not have one) for your aftercare needs so that they can reassess your need  for medications and monitor your lab values.    Today   CHIEF COMPLAINT:   Chief Complaint  Darius Norris presents with  . Chest Pain    HISTORY OF PRESENT ILLNESS:  Darius Norris  is a 66 y.o. male with a known history of three-vessel CAD status post myocardial infarction that has been medically managed as well as COPD and hypertension presents to the emergency department complaining of chest pain.  The pain awoke him from sleep and was a heavy feeling of pressure in his central chest.  The pain radiated to his back and the Darius Norris admits to feeling nausea but denies vomiting or diaphoresis.  His pain is eased off in the emergency department after aspirin.  Darius Norris's blood pressure was found to be very elevated as well.  Initial troponin is negative.  Due to the Darius Norris's comorbidities as well as ongoing pain and known heart disease the emergency department staff called the hospitalist service for admission.   VITAL SIGNS:  Blood pressure (!) 141/75, pulse 61, temperature (!) 97.4 F (36.3 C), temperature source Oral, resp. rate 16, height 5\' 6"  (1.676  m), weight 91.9 kg (202 lb 9.6 oz), SpO2 99 %.  I/O:  No intake or output data in the 24 hours ending 11/20/17 2249  PHYSICAL EXAMINATION:  GENERAL:  66 y.o.-year-old Darius Norris lying in the bed with no acute distress.  EYES: Pupils equal, round, reactive to light and accommodation. No scleral icterus. Extraocular muscles intact.  HEENT: Head atraumatic, normocephalic. Oropharynx and nasopharynx clear.  NECK:  Supple, no jugular venous distention. No thyroid enlargement, no tenderness.  LUNGS: Normal breath sounds bilaterally, no wheezing, rales,rhonchi or crepitation. No use of accessory muscles of respiration.  CARDIOVASCULAR: S1, S2 normal. No murmurs, rubs, or gallops.  ABDOMEN: Soft, non-tender, non-distended. Bowel sounds present. No organomegaly or mass.  EXTREMITIES: No pedal edema, cyanosis, or clubbing.  NEUROLOGIC: Cranial nerves II  through XII are intact. Muscle strength 5/5 in all extremities. Sensation intact. Gait not checked.  PSYCHIATRIC: The Darius Norris is alert and oriented x 3.  SKIN: No obvious rash, lesion, or ulcer.   DATA REVIEW:   CBC Recent Labs  Lab 11/17/17 0533  WBC 8.8  HGB 16.4  HCT 49.7  PLT 218    Chemistries  Recent Labs  Lab 11/18/17 0435  NA 137  K 3.6  CL 100*  CO2 29  GLUCOSE 140*  BUN 7  CREATININE 0.84  CALCIUM 9.3  MG 2.0    Cardiac Enzymes Recent Labs  Lab 11/17/17 2026  TROPONINI 0.48*    Microbiology Results  Results for orders placed or performed during the hospital encounter of 09/04/16  Culture, expectorated sputum-assessment     Status: None   Collection Time: 09/05/16  6:35 AM  Result Value Ref Range Status   Specimen Description EXPECTORATED SPUTUM  Final   Special Requests NONE  Final   Sputum evaluation THIS SPECIMEN IS ACCEPTABLE FOR SPUTUM CULTURE  Final   Report Status 09/05/2016 FINAL  Final  Culture, respiratory (NON-Expectorated)     Status: None   Collection Time: 09/05/16  6:35 AM  Result Value Ref Range Status   Specimen Description EXPECTORATED SPUTUM  Final   Special Requests NONE Reflexed from [id number]  Final   Gram Stain   Final    ABUNDANT WBC PRESENT,BOTH PMN AND MONONUCLEAR ABUNDANT GRAM POSITIVE COCCI IN CHAINS IN PAIRS RARE YEAST    Culture   Final    Consistent with normal respiratory flora. Performed at [facility]    Report Status 09/07/2016 FINAL  Final    RADIOLOGY:  No results found.  EKG:   Orders placed or performed during the hospital encounter of 11/17/17  . ED EKG  . ED EKG  . EKG 12-Lead  . EKG 12-Lead  . EKG 12-Lead  . EKG 12-Lead      Management plans discussed with the Darius Norris, family and they are in agreement.  CODE STATUS:  Code Status History    Date Active Date Inactive Code Status Order ID Comments User Context   11/17/2017 08:37 11/18/2017 16:35 Full Code [id number]  Harrie Foreman, MD Inpatient   09/04/2016 19:59 09/05/2016 17:36 Full Code [id number]  Theodoro Grist, MD Inpatient   12/15/2015 01:58 12/15/2015 19:22 Full Code [id number]  Lance Coon, MD Inpatient      TOTAL TIME TAKING CARE OF THIS Darius Norris: 35 minutes.    Vaughan Basta M.D on 11/20/2017 at 10:49 PM  Between 7am to 6pm - Pager - [phone]  After 6pm go to [email or web address] - Proofreader  [employer]  [phone]  CC: Primary care physician; Darius Norris, No Pcp Per   Note: This dictation was prepared with Dragon dictation along with smaller phrase technology. Any transcriptional errors that result from this process are unintentional.

## 2017-11-29 NOTE — ED Provider Notes (Signed)
[facility] Emergency Department Provider Note   First MD Initiated Contact with Patient 11/17/17 [phone]     (approximate)  I have reviewed the triage vital signs and the nursing notes.   HISTORY  Chief Complaint Chest Pain    HPI Darius Norris is a 66 y.o. male with below list of chronic medical conditions including previous myocardial infarction, COPD presents to the emergency department with acute onset of central chest pain with radiation to his back that awoke him from sleep this morning.  Patient describes the pain as pressure.  Patient denies any nausea no vomiting.  Patient denies any dyspnea lower externally pain or swelling.  Patient denies any diaphoresis.   Past Medical History:  Diagnosis Date  . CAD (coronary artery disease)   . Colitis   . COPD (chronic obstructive pulmonary disease) ([place])   . Emphysema lung ([place])   . GERD (gastroesophageal reflux disease)   . Hypertension   . MI (myocardial infarction) [facility]) 1999    Patient Active Problem List   Diagnosis Date Noted  . Chest pain 11/17/2017  . Unstable angina ([place]) 09/04/2016  . Uncontrolled hypertension 09/04/2016  . Hypokalemia 09/04/2016  . Leukocytosis 09/04/2016  . Hyperglycemia 09/04/2016  . COPD exacerbation ([place]) 12/15/2015  . Elevated troponin 12/15/2015  . HTN (hypertension) 12/15/2015  . GERD (gastroesophageal reflux disease) 12/15/2015  . CAD (coronary artery disease) 12/15/2015  . Angina pectoris ([place]) 12/15/2015    Past Surgical History:  Procedure Laterality Date  . ABDOMINAL HERNIA REPAIR Bilateral     Prior to Admission medications   Medication Sig Start Date End Date Taking? Authorizing Provider  amitriptyline (ELAVIL) 10 MG tablet Take 30 mg by mouth at bedtime.   Yes [provider]  budesonide-formoterol (SYMBICORT) 160-4.5 MCG/ACT inhaler Inhale 2 puffs into the lungs 2 (two) times daily.   Yes [provider]  busPIRone (BUSPAR)  10 MG tablet Take 10 mg by mouth 2 (two) times daily.   Yes [provider]  clobetasol cream (TEMOVATE) 0.93 % Apply 1 application topically daily as needed. For itching on psoriasis areas   Yes [provider]  diclofenac sodium (VOLTAREN) 1 % GEL Apply 2 g topically 2 (two) times daily.   Yes [provider]  finasteride (PROSCAR) 5 MG tablet Take 5 mg by mouth daily.   Yes [provider]  fluticasone (FLONASE) 50 MCG/ACT nasal spray Place 2 sprays into both nostrils daily.   Yes [provider]  Ipratropium-Albuterol (COMBIVENT RESPIMAT) 20-100 MCG/ACT AERS respimat Inhale 1 puff into the lungs every 6 (six) hours as needed for wheezing or shortness of breath.   Yes [provider]  isosorbide mononitrate (IMDUR) 60 MG 24 hr tablet Take 60 mg by mouth daily.   Yes [provider]  lisinopril (PRINIVIL,ZESTRIL) 20 MG tablet Take 10 mg by mouth daily.   Yes [provider]  loratadine (CLARITIN) 10 MG tablet Take 10 mg by mouth daily.   Yes [provider]  omeprazole (PRILOSEC) 20 MG capsule Take 40 mg by mouth daily.   Yes [provider]  salicyclic acid-sulfur (SEBULEX) 2-2 % shampoo Apply 1 application topically daily.   Yes [provider]  acetaminophen (TYLENOL) 325 MG tablet Take 975 mg by mouth 3 (three) times daily.    [provider]  atorvastatin (LIPITOR) 80 MG tablet Take 1 tablet (80 mg total) by mouth at bedtime. 11/18/17   Vaughan Basta, MD  metoprolol  tartrate (LOPRESSOR) 100 MG tablet Take 1 tablet (100 mg total) by mouth 2 (two) times daily. 11/18/17   Vaughan Basta, MD    Allergies Zantac [ranitidine hcl]; Codeine; Erythromycin; and Vicodin [hydrocodone-acetaminophen]  Family History  Problem Relation Age of Onset  . CAD Father   . Stroke Father   . CAD Sister   . Stroke Brother     Social History Social History   Tobacco Use  . Smoking  status: Former Research scientist (life sciences)  . Smokeless tobacco: Never Used  Substance Use Topics  . Alcohol use: No  . Drug use: No    Review of Systems Constitutional: No fever/chills Eyes: No visual changes. ENT: No sore throat. Cardiovascular: Positive for chest pain. Respiratory: Denies shortness of breath. Gastrointestinal: No abdominal pain.  No nausea, no vomiting.  No diarrhea.  No constipation. Genitourinary: Negative for dysuria. Musculoskeletal: Negative for neck pain.  Negative for back pain. Integumentary: Negative for rash. Neurological: Negative for headaches, focal weakness or numbness.   ____________________________________________   PHYSICAL EXAM:  VITAL SIGNS: ED Triage Vitals  Enc Vitals Group     BP 11/17/17 0530 (!) 176/115     Pulse Rate 11/17/17 0537 (!) 147     Resp 11/17/17 0530 (!) 21     Temp 11/17/17 0537 97.6 F (36.4 C)     Temp Source 11/17/17 0537 Oral     SpO2 11/17/17 0527 96 %     Weight 11/17/17 0537 95.3 kg (210 lb)     Height 11/17/17 0537 1.676 m (5\' 6" )     Head Circumference --      Peak Flow --      Pain Score 11/17/17 0533 5     Pain Loc --      Pain Edu? --      Excl. in [place]? --     Constitutional: Alert and oriented. Well appearing and in no acute distress. Eyes: Conjunctivae are normal.  Head: Atraumatic. Mouth/Throat: Mucous membranes are moist.  Oropharynx non-erythematous. Neck: No stridor.   Cardiovascular: Tachycardia, regular rhythm. Good peripheral circulation. Grossly normal heart sounds. Respiratory: Normal respiratory effort.  No retractions. Lungs CTAB. Gastrointestinal: Soft and nontender. No distention.  Musculoskeletal: No lower extremity tenderness nor edema. No gross deformities of extremities. Neurologic:  Normal speech and language. No gross focal neurologic deficits are appreciated.  Skin:  Skin is warm, dry and intact. No rash noted. Psychiatric: Mood and affect are normal. Speech and behavior are  normal.  ____________________________________________   LABS (all labs ordered are listed, but only abnormal results are displayed)  Labs Reviewed  BASIC METABOLIC PANEL - Abnormal; Notable for the following components:      Result Value   Potassium 2.6 (*)    Chloride 99 (*)    Glucose, Bld 189 (*)    All other components within normal limits  CBC - Abnormal; Notable for the following components:   RDW 16.6 (*)    All other components within normal limits  GLUCOSE, CAPILLARY - Abnormal; Notable for the following components:   Glucose-Capillary 197 (*)    All other components within normal limits  TROPONIN I - Abnormal; Notable for the following components:   Troponin I 0.34 (*)    All other components within normal limits  TROPONIN I - Abnormal; Notable for the following components:   Troponin I 0.59 (*)    All other components within normal limits  TROPONIN I - Abnormal; Notable for the following components:   Troponin  I 0.48 (*)    All other components within normal limits  BASIC METABOLIC PANEL - Abnormal; Notable for the following components:   Chloride 100 (*)    Glucose, Bld 140 (*)    All other components within normal limits  TROPONIN I  TSH  MAGNESIUM  MAGNESIUM  MISC [facility] TEST (SEND OUT)   ____________________________________________  EKG  ED ECG REPORT I, [place] N Tonishia Steffy, the attending physician, personally viewed and interpreted this ECG.   Date: 11/29/2017  EKG Time: 5:29 AM  Rate: 147  Rhythm: Sinus tachycardia  Axis: Normal  Intervals: Normal  ST&T Change: None  ____________________________________________  RADIOLOGY I, [place] N Skye Plamondon, personally viewed and evaluated these images (plain radiographs) as part of my medical decision making, as well as reviewing the written report by the radiologist.  CLINICAL DATA:  Initial evaluation for acute chest pain.  EXAM: PORTABLE CHEST 1 VIEW  COMPARISON:  Prior radiograph from  09/22/2017.  FINDINGS: The cardiac and mediastinal silhouettes are stable in size and contour, and remain within normal limits.  The lungs are hyperinflated. Attenuation of the pulmonary markings at the upper lungs consistent with emphysema. Bibasilar interstitial prominence is stable from previous. No focal infiltrates. No pulmonary edema or pleural effusion. No pneumothorax.  No acute osseus abnormality.  IMPRESSION: 1. Hyperinflation with underlying emphysema, stable from previous. 2. No other active cardiopulmonary disease.   Electronically Signed   By: Jeannine Boga M.D.   On: 11/17/2017 06:12   Procedures   ____________________________________________   INITIAL IMPRESSION / ASSESSMENT AND PLAN / ED COURSE  As part of my medical decision making, I reviewed the following data within the electronic MEDICAL RECORD [id number] year old male presenting with above-stated history and physical exam secondary to chest pain which occurred while sleeping and awoke the patient.  EKG revealed sinus tachycardia patient noted to be hypertensive.  While EKG revealed no evidence of ST segment elevation or depression concerning for possible cardiac etiology for the patient's chest pain and as such patient was discussed with Dr. Marcille Blanco for hospital admission for further evaluation and management ____________________________________________  FINAL CLINICAL IMPRESSION(S) / ED DIAGNOSES  Final diagnoses:  Chest pain, unspecified type  Hypokalemia     MEDICATIONS GIVEN DURING THIS VISIT:  Medications  aspirin chewable tablet 324 mg (324 mg Oral Given 11/17/17 0554)  metoprolol tartrate (LOPRESSOR) injection 5 mg (5 mg Intravenous Not Given 11/17/17 0600)  potassium chloride SA (K-DUR,KLOR-CON) CR tablet 40 mEq (40 mEq Oral Given 11/17/17 1239)     ED Discharge Orders        Ordered    atorvastatin (LIPITOR) 80 MG tablet  Daily at bedtime     11/18/17 1211    metoprolol  tartrate (LOPRESSOR) 100 MG tablet  2 times daily     11/18/17 1211    Increase activity slowly     11/18/17 1211    Diet - low sodium heart healthy     11/18/17 1211       Note:  This document was prepared using Dragon voice recognition software and may include unintentional dictation errors.    Gregor Hams, MD 11/29/17 [phone]

## 2017-12-10 ENCOUNTER — Encounter: Payer: Self-pay | Admitting: Emergency Medicine

## 2017-12-10 ENCOUNTER — Other Ambulatory Visit: Payer: Self-pay

## 2017-12-10 ENCOUNTER — Emergency Department
Admission: EM | Admit: 2017-12-10 | Discharge: 2017-12-11 | Disposition: A | Discharge status: Home / Self Care | Payer: Medicare Other | Attending: Emergency Medicine | Admitting: Emergency Medicine

## 2017-12-10 DIAGNOSIS — K5900 Constipation, unspecified: Secondary | ICD-10-CM | POA: Insufficient documentation

## 2017-12-10 DIAGNOSIS — Z87891 Personal history of nicotine dependence: Secondary | ICD-10-CM | POA: Insufficient documentation

## 2017-12-10 DIAGNOSIS — J449 Chronic obstructive pulmonary disease, unspecified: Secondary | ICD-10-CM | POA: Diagnosis not present

## 2017-12-10 DIAGNOSIS — Z79899 Other long term (current) drug therapy: Secondary | ICD-10-CM | POA: Insufficient documentation

## 2017-12-10 DIAGNOSIS — I2510 Atherosclerotic heart disease of native coronary artery without angina pectoris: Secondary | ICD-10-CM | POA: Insufficient documentation

## 2017-12-10 DIAGNOSIS — R1031 Right lower quadrant pain: Secondary | ICD-10-CM

## 2017-12-10 DIAGNOSIS — I10 Essential (primary) hypertension: Secondary | ICD-10-CM | POA: Diagnosis not present

## 2017-12-10 LAB — COMPREHENSIVE METABOLIC PANEL
ALK PHOS: 120 U/L (ref 38–126)
ALT: 15 U/L — AB (ref 17–63)
ANION GAP: 11 (ref 5–15)
AST: 18 U/L (ref 15–41)
Albumin: 3.9 g/dL (ref 3.5–5.0)
BILIRUBIN TOTAL: 0.6 mg/dL (ref 0.3–1.2)
BUN: 9 mg/dL (ref 6–20)
CALCIUM: 8.7 mg/dL — AB (ref 8.9–10.3)
CO2: 27 mmol/L (ref 22–32)
CREATININE: 0.94 mg/dL (ref 0.61–1.24)
Chloride: 100 mmol/L — ABNORMAL LOW (ref 101–111)
GFR calc non Af Amer: >60 mL/min (ref >60)
Glucose, Bld: 109 mg/dL — ABNORMAL HIGH (ref 65–99)
Potassium: 3.2 mmol/L — ABNORMAL LOW (ref 3.5–5.1)
SODIUM: 138 mmol/L (ref 135–145)
TOTAL PROTEIN: 7 g/dL (ref 6.5–8.1)

## 2017-12-10 LAB — URINALYSIS, COMPLETE (UACMP) WITH MICROSCOPIC
BILIRUBIN URINE: NEGATIVE
Bacteria, UA: NONE SEEN
Glucose, UA: NEGATIVE mg/dL
HGB URINE DIPSTICK: NEGATIVE
Ketones, ur: NEGATIVE mg/dL
LEUKOCYTES UA: NEGATIVE
Nitrite: NEGATIVE
PH: 6 (ref 5.0–8.0)
Protein, ur: NEGATIVE mg/dL
SPECIFIC GRAVITY, URINE: 1.009 (ref 1.005–1.030)
SQUAMOUS EPITHELIAL / LPF: NONE SEEN

## 2017-12-10 LAB — CBC
HCT: 46 % (ref 40.0–52.0)
Hemoglobin: 15.1 g/dL (ref 13.0–18.0)
MCH: 28.5 pg (ref 26.0–34.0)
MCHC: 32.8 g/dL (ref 32.0–36.0)
MCV: 86.9 fL (ref 80.0–100.0)
PLATELETS: 228 10*3/uL (ref 150–440)
RBC: 5.29 MIL/uL (ref 4.40–5.90)
RDW: 16.5 % — AB (ref 11.5–14.5)
WBC: 9.7 10*3/uL (ref 3.8–10.6)

## 2017-12-10 LAB — LIPASE, BLOOD: Lipase: 23 U/L (ref 11–51)

## 2017-12-10 MED ORDER — FENTANYL CITRATE (PF) 100 MCG/2ML IJ SOLN
25.0000 ug | Freq: Once | INTRAMUSCULAR | Status: AC
  Administered 2017-12-10: 25 ug via INTRAVENOUS
  Filled 2017-12-10: qty 2

## 2017-12-10 MED ORDER — SODIUM CHLORIDE 0.9 % IV BOLUS (SEPSIS)
500.0000 mL | Freq: Once | INTRAVENOUS | Status: AC
  Administered 2017-12-10: 500 mL via INTRAVENOUS

## 2017-12-10 MED ORDER — ONDANSETRON HCL 4 MG/2ML IJ SOLN
4.0000 mg | Freq: Once | INTRAMUSCULAR | Status: AC
  Administered 2017-12-10: 4 mg via INTRAVENOUS
  Filled 2017-12-10: qty 2

## 2017-12-10 NOTE — ED Provider Notes (Signed)
[facility] Emergency Department Provider Note   ____________________________________________   First MD Initiated Contact with Patient 12/10/17 2313     (approximate)  I have reviewed the triage vital signs and the nursing notes.   HISTORY  Chief Complaint Abdominal Pain    HPI Darius Norris is a 66 y.o. male who presents to the ED from home with a chief complaint of abdominal pain.  Patient complains of right lower quadrant abdominal pain for the past week, progressively getting worse.  Describes it as sharp and dull aching type pain which is nonradiating and associated with nausea only.  He was constipated but had a large bowel movements last evening.  Denies associated fever, chills, chest pain, shortness of breath, dysuria.  Denies recent travel or trauma.   Past Medical History:  Diagnosis Date  . CAD (coronary artery disease)   . Colitis   . COPD (chronic obstructive pulmonary disease) ([place])   . Emphysema lung ([place])   . GERD (gastroesophageal reflux disease)   . Hypertension   . MI (myocardial infarction) [facility]) 1999    Patient Active Problem List   Diagnosis Date Noted  . Chest pain 11/17/2017  . Unstable angina ([place]) 09/04/2016  . Uncontrolled hypertension 09/04/2016  . Hypokalemia 09/04/2016  . Leukocytosis 09/04/2016  . Hyperglycemia 09/04/2016  . COPD exacerbation ([place]) 12/15/2015  . Elevated troponin 12/15/2015  . HTN (hypertension) 12/15/2015  . GERD (gastroesophageal reflux disease) 12/15/2015  . CAD (coronary artery disease) 12/15/2015  . Angina pectoris ([place]) 12/15/2015    Past Surgical History:  Procedure Laterality Date  . ABDOMINAL HERNIA REPAIR Bilateral     Prior to Admission medications   Medication Sig Start Date End Date Taking? Authorizing Provider  acetaminophen (TYLENOL) 325 MG tablet Take 975 mg by mouth 3 (three) times daily.   Yes [provider]  amitriptyline (ELAVIL) 10 MG tablet Take 30 mg  by mouth at bedtime.   Yes [provider]  atorvastatin (LIPITOR) 80 MG tablet Take 1 tablet (80 mg total) by mouth at bedtime. 11/18/17  Yes Vaughan Basta, MD  budesonide-formoterol [facility]) 160-4.5 MCG/ACT inhaler Inhale 2 puffs into the lungs 2 (two) times daily.   Yes [provider]  clobetasol cream (TEMOVATE) 1.76 % Apply 1 application topically daily as needed. For itching on psoriasis areas   Yes [provider]  diclofenac sodium (VOLTAREN) 1 % GEL Apply 2 g topically 2 (two) times daily.   Yes [provider]  finasteride (PROSCAR) 5 MG tablet Take 5 mg by mouth daily.   Yes [provider]  fluticasone (FLONASE) 50 MCG/ACT nasal spray Place 2 sprays into both nostrils daily.   Yes [provider]  Ipratropium-Albuterol (COMBIVENT RESPIMAT) 20-100 MCG/ACT AERS respimat Inhale 1 puff into the lungs every 6 (six) hours as needed for wheezing or shortness of breath.   Yes [provider]  isosorbide mononitrate (IMDUR) 60 MG 24 hr tablet Take 60 mg by mouth daily.   Yes [provider]  lisinopril (PRINIVIL,ZESTRIL) 10 MG tablet Take 10 mg by mouth daily.   Yes [provider]  loratadine (CLARITIN) 10 MG tablet Take 10 mg by mouth daily.   Yes [provider]  metoprolol tartrate (LOPRESSOR) 100 MG tablet Take 1 tablet (100 mg total) by mouth 2 (two) times daily. 11/18/17  Yes Vaughan Basta, MD  omeprazole (PRILOSEC) 20 MG capsule Take 40 mg by mouth daily.   Yes [provider]  busPIRone (BUSPAR) 10 MG tablet Take 10 mg by mouth 2 (two) times daily.    [provider]  dicyclomine (BENTYL) 20 MG tablet Take 1 tablet (20 mg total) by mouth every 6 (six) hours as needed. 12/11/17   Paulette Blanch, MD  lactulose (CHRONULAC) 10 GM/15ML solution Take 30 mLs (20 g total) by mouth daily as needed for mild constipation. 12/11/17   Paulette Blanch, MD    Allergies Zantac  [ranitidine hcl]; Codeine; Erythromycin; and Vicodin [hydrocodone-acetaminophen]  Family History  Problem Relation Age of Onset  . CAD Father   . Stroke Father   . CAD Sister   . Stroke Brother     Social History Social History   Tobacco Use  . Smoking status: Former Research scientist (life sciences)  . Smokeless tobacco: Never Used  Substance Use Topics  . Alcohol use: No  . Drug use: No    Review of Systems  Constitutional: No fever/chills. Eyes: No visual changes. ENT: No sore throat. Cardiovascular: Denies chest pain. Respiratory: Denies shortness of breath. Gastrointestinal: Positive for abdominal pain and nausea, no vomiting.  No diarrhea.  No constipation. Genitourinary: Negative for dysuria. Musculoskeletal: Negative for back pain. Skin: Negative for rash. Neurological: Negative for headaches, focal weakness or numbness.   ____________________________________________   PHYSICAL EXAM:  VITAL SIGNS: ED Triage Vitals  Enc Vitals Group     BP 12/10/17 2143 (!) 155/74     Pulse Rate 12/10/17 2143 60     Resp 12/10/17 2143 16     Temp 12/10/17 2143 98 F (36.7 C)     Temp Source 12/10/17 2143 Oral     SpO2 12/10/17 2143 96 %     Weight 12/10/17 2144 210 lb (95.3 kg)     Height 12/10/17 2144 5\' 6"  (1.676 m)     Head Circumference --      Peak Flow --      Pain Score 12/10/17 2207 6     Pain Loc --      Pain Edu? --      Excl. in [place]? --     Constitutional: Alert and oriented. Well appearing and in mild acute distress. Eyes: Conjunctivae are normal. PERRL. EOMI. Head: Atraumatic. Nose: No congestion/rhinnorhea. Mouth/Throat: Mucous membranes are moist.  Oropharynx non-erythematous. Neck: No stridor.   Cardiovascular: Normal rate, regular rhythm. Grossly normal heart sounds.  Good peripheral circulation. Respiratory: Normal respiratory effort.  No retractions. Lungs CTAB. Gastrointestinal: Soft and mildly tender to palpation right mid and lower quadrant without rebound or  guarding. No distention. No abdominal bruits. No CVA tenderness. Musculoskeletal: No lower extremity tenderness nor edema.  No joint effusions. Neurologic:  Normal speech and language. No gross focal neurologic deficits are appreciated. No gait instability. Skin:  Skin is warm, dry and intact. No rash noted. Psychiatric: Mood and affect are normal. Speech and behavior are normal.  ____________________________________________   LABS (all labs ordered are listed, but only abnormal results are displayed)  Labs Reviewed  COMPREHENSIVE METABOLIC PANEL - Abnormal; Notable for the following components:      Result Value   Potassium 3.2 (*)    Chloride 100 (*)    Glucose, Bld 109 (*)    Calcium 8.7 (*)    ALT 15 (*)    All other components within normal limits  CBC - Abnormal; Notable for the following components:   RDW 16.5 (*)    All other components within normal limits  URINALYSIS, COMPLETE (UACMP) WITH MICROSCOPIC -  Abnormal; Notable for the following components:   Color, Urine YELLOW (*)    APPearance CLEAR (*)    All other components within normal limits  LIPASE, BLOOD   ____________________________________________  EKG  None ____________________________________________  RADIOLOGY  Ct Abdomen Pelvis W Contrast  Result Date: 12/11/2017 CLINICAL DATA:  Right-sided abdominal pain EXAM: CT ABDOMEN AND PELVIS WITH CONTRAST TECHNIQUE: Multidetector CT imaging of the abdomen and pelvis was performed using the standard protocol following bolus administration of intravenous contrast. CONTRAST:  47mL ISOVUE-370 IOPAMIDOL (ISOVUE-370) INJECTION 76% COMPARISON:  None. FINDINGS: Lower chest: No pulmonary nodules or pleural effusion. No visible pericardial effusion. Hepatobiliary: Normal hepatic contours and density. No visible biliary dilatation. Status post cholecystectomy. Pancreas: Normal contours without ductal dilatation. No peripancreatic fluid collection. Spleen: Normal.  Adrenals/Urinary Tract: --Adrenal glands: Normal. --Right kidney/ureter: No hydronephrosis or perinephric stranding. No nephrolithiasis. No obstructing ureteral stones. --Left kidney/ureter: 1.8 cm exophytic left renal cyst. --Urinary bladder: Unremarkable. Stomach/Bowel: --Stomach/Duodenum: No hiatal hernia or other gastric abnormality. Normal duodenal course and caliber. --Small bowel: No dilatation or inflammation. --Colon: No focal abnormality. The colonic stool volume is not abnormal. --Appendix: Normal. Vascular/Lymphatic: There is atherosclerotic calcification of the non aneurysmal abdominal aorta. No abdominal or pelvic lymphadenopathy. Reproductive: No free fluid in the pelvis. Musculoskeletal. No bony spinal canal stenosis or focal osseous abnormality. Other: None. IMPRESSION: 1. No acute abdominopelvic abnormality. 2.  Aortic Atherosclerosis (ICD10-I70.0). Electronically Signed   By: Ulyses Jarred M.D.   On: 12/11/2017 01:34    ____________________________________________   PROCEDURES  Procedure(s) performed: None  Procedures  Critical Care performed: No  ____________________________________________   INITIAL IMPRESSION / ASSESSMENT AND PLAN / ED COURSE  As part of my medical decision making, I reviewed the following data within the [place] History obtained from family, Nursing notes reviewed and incorporated, Labs reviewed, Radiograph reviewed and Notes from prior ED visits.   66 year old male who presents with a one-week history of right lower quadrant abdominal pain associated with nausea. Differential diagnosis includes, but is not limited to, acute appendicitis, renal colic, testicular torsion, urinary tract infection/pyelonephritis, prostatitis,  epididymitis, diverticulitis, small bowel obstruction or ileus, colitis, abdominal aortic aneurysm, gastroenteritis, hernia, etc.  Initial lab work and urinalysis remarkable for mild hypokalemia.  Given the  duration and location of patient's pain, will administer IV analgesia with antiemetic, and proceed to CT abdomen/pelvis.  Clinical Course as of Dec 11 745  Tue Dec 11, 2017  0207 Updated patient and spouse of CT results.  Will discharge home with lactulose and Bentyl, patient will follow up with his PCP later this week.  Strict return precautions given.  Both verbalize understanding and agree with plan of care.  [JS]    Clinical Course User Index [JS] Paulette Blanch, MD     ____________________________________________   FINAL CLINICAL IMPRESSION(S) / ED DIAGNOSES  Final diagnoses:  Right lower quadrant abdominal pain  Constipation, unspecified constipation type     ED Discharge Orders        Ordered    dicyclomine (BENTYL) 20 MG tablet  Every 6 hours PRN     12/11/17 0209    lactulose (CHRONULAC) 10 GM/15ML solution  Daily PRN     12/11/17 0209       Note:  This document was prepared using Dragon voice recognition software and may include unintentional dictation errors.    Paulette Blanch, MD 12/11/17 [phone]

## 2017-12-10 NOTE — ED Triage Notes (Signed)
Patient states he has been having lower right sided abd pain x1 week and states it got worse today. Patient states he has had a little bit nausea and was constipated but had a bowel movement today. Patient is on PRN o2 at home.

## 2017-12-11 ENCOUNTER — Encounter: Payer: Self-pay | Admitting: Radiology

## 2017-12-11 ENCOUNTER — Emergency Department: Payer: Medicare Other

## 2017-12-11 MED ORDER — DICYCLOMINE HCL 20 MG PO TABS
20.0000 mg | ORAL_TABLET | Freq: Once | ORAL | Status: AC
  Administered 2017-12-11: 20 mg via ORAL
  Filled 2017-12-11: qty 1

## 2017-12-11 MED ORDER — LACTULOSE 10 GM/15ML PO SOLN
30.0000 g | Freq: Once | ORAL | Status: AC
  Administered 2017-12-11: 30 g via ORAL
  Filled 2017-12-11: qty 60

## 2017-12-11 MED ORDER — IOPAMIDOL (ISOVUE-300) INJECTION 61%
30.0000 mL | Freq: Once | INTRAVENOUS | Status: AC | PRN
  Administered 2017-12-11: 30 mL via ORAL

## 2017-12-11 MED ORDER — DICYCLOMINE HCL 20 MG PO TABS: 20.0000 mg | ORAL_TABLET | Freq: Four times a day (QID) | ORAL | 0 refills | Status: DC | PRN

## 2017-12-11 MED ORDER — KETOROLAC TROMETHAMINE 30 MG/ML IJ SOLN
10.0000 mg | Freq: Once | INTRAMUSCULAR | Status: AC
  Administered 2017-12-11: 9.9 mg via INTRAVENOUS
  Filled 2017-12-11: qty 1

## 2017-12-11 MED ORDER — IOPAMIDOL (ISOVUE-370) INJECTION 76%
75.0000 mL | Freq: Once | INTRAVENOUS | Status: AC | PRN
  Administered 2017-12-11: 75 mL via INTRAVENOUS

## 2017-12-11 MED ORDER — LACTULOSE 10 GM/15ML PO SOLN: 20.0000 g | Freq: Every day | ORAL | 0 refills | Status: DC | PRN

## 2017-12-11 NOTE — Discharge Instructions (Signed)
1.  Take laxative as needed for bowel movements (Lactulose). 2.  You may take Bentyl as needed for abdominal cramps. 3.  Drink plenty of fluids daily. 4.  Return to the ER for worsening symptoms, persistent vomiting, difficulty breathing or other concerns.

## 2017-12-11 NOTE — ED Notes (Signed)
Patient transported to CT 

## 2018-01-16 ENCOUNTER — Other Ambulatory Visit: Payer: Self-pay

## 2018-01-16 ENCOUNTER — Telehealth: Payer: Self-pay

## 2018-01-16 DIAGNOSIS — Z1211 Encounter for screening for malignant neoplasm of colon: Secondary | ICD-10-CM

## 2018-01-16 NOTE — Telephone Encounter (Signed)
Gastroenterology Pre-Procedure Review  Request Date: 02/01/18 Requesting Physician: Dr. Vicente Males  PATIENT REVIEW QUESTIONS: The patient responded to the following health history questions as indicated:    1. Are you having any GI issues? yes (IBS being treated at [facility]) 2. Do you have a personal history of Polyps? no 3. Do you have a family history of Colon Cancer or Polyps? no 4. Diabetes Mellitus? no 5. Joint replacements in the past 12 months?no 6. Major health problems in the past 3 months?yes (12/10/18 ER Visit Abd Pain, Constipation) 7. Any artificial heart valves, MVP, or defibrillator?no    MEDICATIONS & ALLERGIES:    Patient reports the following regarding taking any anticoagulation/antiplatelet therapy:   Plavix, Coumadin, Eliquis, Xarelto, Lovenox, Pradaxa, Brilinta, or Effient? no Aspirin? yes (325 Patient has been advised to stop 5 days before colonoscopy)  Patient confirms/reports the following medications:  Current Outpatient Medications  Medication Sig Dispense Refill  . acetaminophen (TYLENOL) 325 MG tablet Take 975 mg by mouth 3 (three) times daily.    Marland Kitchen amitriptyline (ELAVIL) 10 MG tablet Take 30 mg by mouth at bedtime.    Marland Kitchen atorvastatin (LIPITOR) 80 MG tablet Take 1 tablet (80 mg total) by mouth at bedtime. 30 tablet 0  . budesonide-formoterol (SYMBICORT) 160-4.5 MCG/ACT inhaler Inhale 2 puffs into the lungs 2 (two) times daily.    . busPIRone (BUSPAR) 10 MG tablet Take 10 mg by mouth 2 (two) times daily.    . clobetasol cream (TEMOVATE) 4.40 % Apply 1 application topically daily as needed. For itching on psoriasis areas    . diclofenac sodium (VOLTAREN) 1 % GEL Apply 2 g topically 2 (two) times daily.    Marland Kitchen dicyclomine (BENTYL) 20 MG tablet Take 1 tablet (20 mg total) by mouth every 6 (six) hours as needed. 20 tablet 0  . finasteride (PROSCAR) 5 MG tablet Take 5 mg by mouth daily.    . fluticasone (FLONASE) 50 MCG/ACT nasal spray Place 2 sprays into both nostrils  daily.    . Ipratropium-Albuterol (COMBIVENT RESPIMAT) 20-100 MCG/ACT AERS respimat Inhale 1 puff into the lungs every 6 (six) hours as needed for wheezing or shortness of breath.    . isosorbide mononitrate (IMDUR) 60 MG 24 hr tablet Take 60 mg by mouth daily.    Marland Kitchen lactulose (CHRONULAC) 10 GM/15ML solution Take 30 mLs (20 g total) by mouth daily as needed for mild constipation. 120 mL 0  . lisinopril (PRINIVIL,ZESTRIL) 10 MG tablet Take 10 mg by mouth daily.    Marland Kitchen loratadine (CLARITIN) 10 MG tablet Take 10 mg by mouth daily.    . metoprolol tartrate (LOPRESSOR) 100 MG tablet Take 1 tablet (100 mg total) by mouth 2 (two) times daily. 60 tablet 0  . omeprazole (PRILOSEC) 20 MG capsule Take 40 mg by mouth daily.     No current facility-administered medications for this visit.     Patient confirms/reports the following allergies:  Allergies  Allergen Reactions  . Zantac [Ranitidine Hcl] Shortness Of Breath  . Codeine Itching  . Erythromycin Hives  . Vicodin [Hydrocodone-Acetaminophen] Rash    No orders of the defined types were placed in this encounter.   AUTHORIZATION INFORMATION Primary Insurance: 1D#: Group #:  Secondary Insurance: 1D#: Group #:  SCHEDULE INFORMATION: Date: 02/01/18 Time: Location:[facility]

## 2018-01-31 ENCOUNTER — Encounter: Payer: Self-pay | Admitting: *Deleted

## 2018-02-01 ENCOUNTER — Encounter: Payer: Self-pay | Admitting: Anesthesiology

## 2018-02-01 ENCOUNTER — Ambulatory Visit: Payer: Non-veteran care | Admitting: Anesthesiology

## 2018-02-01 ENCOUNTER — Ambulatory Visit
Admission: RE | Admit: 2018-02-01 | Discharge: 2018-02-01 | Disposition: A | Discharge status: Home / Self Care | Payer: Non-veteran care | Source: Ambulatory Visit | Attending: Gastroenterology | Admitting: Gastroenterology

## 2018-02-01 ENCOUNTER — Other Ambulatory Visit: Payer: Self-pay

## 2018-02-01 ENCOUNTER — Encounter
Admission: RE | Disposition: A | Discharge status: Home / Self Care | Payer: Self-pay | Source: Ambulatory Visit | Attending: Gastroenterology

## 2018-02-01 DIAGNOSIS — D120 Benign neoplasm of cecum: Secondary | ICD-10-CM | POA: Diagnosis not present

## 2018-02-01 DIAGNOSIS — K5730 Diverticulosis of large intestine without perforation or abscess without bleeding: Secondary | ICD-10-CM

## 2018-02-01 DIAGNOSIS — Z885 Allergy status to narcotic agent status: Secondary | ICD-10-CM | POA: Insufficient documentation

## 2018-02-01 DIAGNOSIS — I252 Old myocardial infarction: Secondary | ICD-10-CM | POA: Diagnosis not present

## 2018-02-01 DIAGNOSIS — K219 Gastro-esophageal reflux disease without esophagitis: Secondary | ICD-10-CM | POA: Diagnosis not present

## 2018-02-01 DIAGNOSIS — Z87891 Personal history of nicotine dependence: Secondary | ICD-10-CM | POA: Diagnosis not present

## 2018-02-01 DIAGNOSIS — I25119 Atherosclerotic heart disease of native coronary artery with unspecified angina pectoris: Secondary | ICD-10-CM | POA: Insufficient documentation

## 2018-02-01 DIAGNOSIS — K649 Unspecified hemorrhoids: Secondary | ICD-10-CM | POA: Insufficient documentation

## 2018-02-01 DIAGNOSIS — I10 Essential (primary) hypertension: Secondary | ICD-10-CM | POA: Insufficient documentation

## 2018-02-01 DIAGNOSIS — Z888 Allergy status to other drugs, medicaments and biological substances status: Secondary | ICD-10-CM | POA: Diagnosis not present

## 2018-02-01 DIAGNOSIS — Z881 Allergy status to other antibiotic agents status: Secondary | ICD-10-CM | POA: Insufficient documentation

## 2018-02-01 DIAGNOSIS — J439 Emphysema, unspecified: Secondary | ICD-10-CM | POA: Diagnosis not present

## 2018-02-01 DIAGNOSIS — Z1211 Encounter for screening for malignant neoplasm of colon: Secondary | ICD-10-CM | POA: Insufficient documentation

## 2018-02-01 DIAGNOSIS — Z79899 Other long term (current) drug therapy: Secondary | ICD-10-CM | POA: Insufficient documentation

## 2018-02-01 DIAGNOSIS — K620 Anal polyp: Secondary | ICD-10-CM

## 2018-02-01 HISTORY — PX: COLONOSCOPY WITH PROPOFOL: SHX5780

## 2018-02-01 SURGERY — COLONOSCOPY WITH PROPOFOL
Anesthesia: General

## 2018-02-01 MED ORDER — SODIUM CHLORIDE 0.9 % IV SOLN
INTRAVENOUS | Status: DC
  Administered 2018-02-01: 11:00:00 via INTRAVENOUS

## 2018-02-01 MED ORDER — PROPOFOL 500 MG/50ML IV EMUL
INTRAVENOUS | Status: AC
  Filled 2018-02-01: qty 50

## 2018-02-01 MED ORDER — PROPOFOL 500 MG/50ML IV EMUL
INTRAVENOUS | Status: AC
Start: 2018-02-01 — End: 2018-02-01
  Filled 2018-02-01: qty 50

## 2018-02-01 MED ORDER — LIDOCAINE HCL (PF) 2 % IJ SOLN
INTRAMUSCULAR | Status: AC
  Filled 2018-02-01: qty 10

## 2018-02-01 MED ORDER — PROPOFOL 10 MG/ML IV BOLUS
INTRAVENOUS | Status: DC | PRN
  Administered 2018-02-01: 70 mg via INTRAVENOUS

## 2018-02-01 MED ORDER — PROPOFOL 500 MG/50ML IV EMUL
INTRAVENOUS | Status: DC | PRN
  Administered 2018-02-01: 140 ug/kg/min via INTRAVENOUS

## 2018-02-01 NOTE — H&P (Signed)
Jonathon Bellows, MD [address] [place], [place], [place], [address] [address], [place], [place], [place], [address] Phone: [phone]  Fax: [phone]  Primary Care Physician:  [place]   Pre-Procedure History & Physical: HPI:  Darius Norris is a 67 y.o. male is here for an colonoscopy.   Past Medical History:  Diagnosis Date  . CAD (coronary artery disease)   . Colitis   . COPD (chronic obstructive pulmonary disease) ([place])   . Emphysema lung ([place])   . GERD (gastroesophageal reflux disease)   . Hypertension   . MI (myocardial infarction) ([place]) 1999    Past Surgical History:  Procedure Laterality Date  . ABDOMINAL HERNIA REPAIR Bilateral     Prior to Admission medications   Medication Sig Start Date End Date Taking? Authorizing Provider  acetaminophen (TYLENOL) 325 MG tablet Take 975 mg by mouth 3 (three) times daily.    [provider]  amitriptyline (ELAVIL) 10 MG tablet Take 30 mg by mouth at bedtime.    [provider]  atorvastatin (LIPITOR) 80 MG tablet Take 1 tablet (80 mg total) by mouth at bedtime. 11/18/17   Vaughan Basta, MD  budesonide-formoterol (SYMBICORT) 160-4.5 MCG/ACT inhaler Inhale 2 puffs into the lungs 2 (two) times daily.    [provider]  busPIRone (BUSPAR) 10 MG tablet Take 10 mg by mouth 2 (two) times daily.    [provider]  clobetasol cream (TEMOVATE) 7.67 % Apply 1 application topically daily as needed. For itching on psoriasis areas    [provider]  diclofenac sodium (VOLTAREN) 1 % GEL Apply 2 g topically 2 (two) times daily.    [provider]  dicyclomine (BENTYL) 20 MG tablet Take 1 tablet (20 mg total) by mouth every 6 (six) hours as needed. 12/11/17   Paulette Blanch, MD  finasteride (PROSCAR) 5 MG tablet Take 5 mg by mouth daily.    [provider]  fluticasone (FLONASE) 50 MCG/ACT nasal spray Place 2 sprays into both nostrils daily.     [provider]  Ipratropium-Albuterol (COMBIVENT RESPIMAT) 20-100 MCG/ACT AERS respimat Inhale 1 puff into the lungs every 6 (six) hours as needed for wheezing or shortness of breath.    [provider]  isosorbide mononitrate (IMDUR) 60 MG 24 hr tablet Take 60 mg by mouth daily.    [provider]  lactulose (CHRONULAC) 10 GM/15ML solution Take 30 mLs (20 g total) by mouth daily as needed for mild constipation. 12/11/17   Paulette Blanch, MD  lisinopril (PRINIVIL,ZESTRIL) 10 MG tablet Take 10 mg by mouth daily.    [provider]  loratadine (CLARITIN) 10 MG tablet Take 10 mg by mouth daily.    [provider]  metoprolol tartrate (LOPRESSOR) 100 MG tablet Take 1 tablet (100 mg total) by mouth 2 (two) times daily. 11/18/17   Vaughan Basta, MD  omeprazole (PRILOSEC) 20 MG capsule Take 40 mg by mouth daily.    [provider]    Allergies as of 01/16/2018 - Review Complete 12/11/2017  Allergen Reaction Noted  . Zantac [ranitidine hcl] Shortness Of Breath 05/21/2015  . Codeine Itching 05/21/2015  . Erythromycin Hives 05/21/2015  . Vicodin [hydrocodone-acetaminophen] Rash 12/14/2015    Family History  Problem Relation Age of Onset  . CAD Father   . Stroke Father   . CAD Sister   . Stroke Brother     Social History   Socioeconomic History  .  Marital status: Married    Spouse name: Not on file  . Number of children: Not on file  . Years of education: Not on file  . Highest education level: Not on file  Social Needs  . Financial resource strain: Not on file  . Food insecurity - worry: Not on file  . Food insecurity - inability: Not on file  . Transportation needs - medical: Not on file  . Transportation needs - non-medical: Not on file  Occupational History  . Not on file  Tobacco Use  . Smoking status: Former Research scientist (life sciences)  . Smokeless tobacco: Never Used  Substance and Sexual Activity  . Alcohol use: No  . Drug use: No    . Sexual activity: No  Other Topics Concern  . Not on file  Social History Narrative  . Not on file    Review of Systems: See HPI, otherwise negative ROS  Physical Exam: There were no vitals taken for this visit. General:   Alert,  pleasant and cooperative in NAD Head:  Normocephalic and atraumatic. Neck:  Supple; no masses or thyromegaly. Lungs:  Clear throughout to auscultation, normal respiratory effort.    Heart:  +S1, +S2, Regular rate and rhythm, No edema. Abdomen:  Soft, nontender and nondistended. Normal bowel sounds, without guarding, and without rebound.   Neurologic:  Alert and  oriented x4;  grossly normal neurologically.  Impression/Plan: Darius Norris is here for an colonoscopy to be performed for Screening colonoscopy average risk   Risks, benefits, limitations, and alternatives regarding  colonoscopy have been reviewed with the patient.  Questions have been answered.  All parties agreeable.   Jonathon Bellows, MD  02/01/2018, 10:00 AM

## 2018-02-01 NOTE — Anesthesia Postprocedure Evaluation (Signed)
Anesthesia Post Note  Patient: Darius Norris  Procedure(s) Performed: COLONOSCOPY WITH PROPOFOL (N/A )  Patient location during evaluation: Endoscopy Anesthesia Type: General Level of consciousness: awake and alert Pain management: pain level controlled Vital Signs Assessment: post-procedure vital signs reviewed and stable Respiratory status: spontaneous breathing, nonlabored ventilation, respiratory function stable and patient connected to nasal cannula oxygen Cardiovascular status: blood pressure returned to baseline and stable Postop Assessment: no apparent nausea or vomiting Anesthetic complications: no     Last Vitals:  Vitals:   02/01/18 1146 02/01/18 1153  BP: (!) 148/89 140/70  Pulse: 74 72  Resp: 18 18  Temp:    SpO2:      Last Pain:  Vitals:   02/01/18 1113  TempSrc: Tympanic  PainSc:                  Dabney Schanz S

## 2018-02-01 NOTE — Transfer of Care (Signed)
Immediate Anesthesia Transfer of Care Note  Patient: Darius Norris  Procedure(s) Performed: COLONOSCOPY WITH PROPOFOL (N/A )  Patient Location: PACU  Anesthesia Type:General  Level of Consciousness: sedated  Airway & Oxygen Therapy: Patient Spontanous Breathing and Patient connected to nasal cannula oxygen  Post-op Assessment: Report given to RN and Post -op Vital signs reviewed and stable  Post vital signs: Reviewed and stable  Last Vitals:  Vitals:   02/01/18 1113 02/01/18 1114  BP: (!) (P) 147/81 (!) 147/81  Pulse: (P) 75 75  Resp: (P) 16 15  Temp: (P) 36.6 C 36.6 C  SpO2: (P) 94% 94%    Last Pain:  Vitals:   02/01/18 1113  TempSrc: (P) Tympanic  PainSc:          Complications: No apparent anesthesia complications

## 2018-02-01 NOTE — Op Note (Signed)
[facility] Patient Name: Darius Darius Procedure Date: 02/01/2018 10:46 AM MRN: [id number] Account #: [id number] Date of Birth: [birth year] Admit Type: Outpatient Age: 67 Room: [facility] ENDO ROOM 4 Gender: Male Note Status: Finalized Procedure:            Colonoscopy Indications:          Screening for colorectal malignant neoplasm Providers:            Jonathon Bellows MD, MD Referring MD:         [facility] (Referring MD) Medicines:            Monitored Anesthesia Care Complications:        No immediate complications. Procedure:            Pre-Anesthesia Assessment:                       - Prior to the procedure, a History and Physical was                        performed, and patient medications, allergies and                        sensitivities were reviewed. The patient's tolerance of                        previous anesthesia was reviewed.                       - The risks and benefits of the procedure and the                        sedation options and risks were discussed with the                        patient. All questions were answered and informed                        consent was obtained.                       - ASA Grade Assessment: III - A patient with severe                        systemic disease.                       After obtaining informed consent, the colonoscope was                        passed under direct vision. Throughout the procedure,                        the patient's blood pressure, pulse, and oxygen                        saturations were monitored continuously. The                        Colonoscope was introduced through the anus and  advanced to the the cecum, identified by the                        appendiceal orifice, IC valve and transillumination.                        The colonoscopy was performed with ease. The patient                        tolerated the procedure well.  The quality of the bowel                        preparation was good. Findings:      The perianal and digital rectal examinations were normal.      Two sessile polyps were found in the cecum. The polyps were 5 to 7 mm in       size. These polyps were removed with a cold snare. Resection and       retrieval were complete.      Multiple small-mouthed diverticula were found in the sigmoid colon.      A 10 mm polyp was found in the anus. The polyp was sessile. The polypoid       lesion seen over a hemorroid- may be a skin tag - no biopsies taken      The exam was otherwise without abnormality on direct and retroflexion       views. Impression:           - Two 5 to 7 mm polyps in the cecum, removed with a                        cold snare. Resected and retrieved.                       - Diverticulosis in the sigmoid colon.                       - One 10 mm polyp at the anus.                       - The examination was otherwise normal on direct and                        retroflexion views. Recommendation:       - Discharge patient to home (with escort).                       - Resume previous diet.                       - Continue present medications.                       - Await pathology results.                       - Repeat colonoscopy for surveillance based on                        pathology results.                       - Refer  to Dr Dahlia Byes to resect polyp vs skin tag over                        hemorroid Procedure Code(s):    --- Professional ---                       [phone], Colonoscopy, flexible; with removal of tumor(s),                        polyp(s), or other lesion(s) by snare technique Diagnosis Code(s):    --- Professional ---                       Z12.11, Encounter for screening for malignant neoplasm                        of colon                       D12.0, Benign neoplasm of cecum                       K62.0, Anal polyp                       K57.30,  Diverticulosis of large intestine without                        perforation or abscess without bleeding CPT copyright 2016 American Medical Association. All rights reserved. The codes documented in this report are preliminary and upon coder review may  be revised to meet current compliance requirements. Jonathon Bellows, MD Jonathon Bellows MD, MD 02/01/2018 11:12:08 AM This report has been signed electronically. Number of Addenda: 0 Note Initiated On: 02/01/2018 10:46 AM Scope Withdrawal Time: 0 hours 14 minutes 6 seconds  Total Procedure Duration: 0 hours 16 minutes 4 seconds       [facility]

## 2018-02-01 NOTE — Anesthesia Post-op Follow-up Note (Signed)
Anesthesia QCDR form completed.        

## 2018-02-01 NOTE — Anesthesia Preprocedure Evaluation (Signed)
Anesthesia Evaluation  Patient identified by MRN, date of birth, ID band Patient awake    Reviewed: Allergy & Precautions, NPO status , Patient's Chart, lab work & pertinent test results, reviewed documented beta blocker date and time   Airway Mallampati: III  TM Distance: >3 FB     Dental  (+) Chipped   Pulmonary COPD, former smoker,           Cardiovascular hypertension, Pt. on medications and Pt. on home beta blockers + angina + CAD and + Past MI       Neuro/Psych    GI/Hepatic GERD  ,  Endo/Other    Renal/GU      Musculoskeletal   Abdominal   Peds  Hematology   Anesthesia Other Findings   Reproductive/Obstetrics                             Anesthesia Physical Anesthesia Plan  ASA: III  Anesthesia Plan: General   Post-op Pain Management:    Induction: Intravenous  PONV Risk Score and Plan:   Airway Management Planned:   Additional Equipment:   Intra-op Plan:   Post-operative Plan:   Informed Consent: I have reviewed the patients History and Physical, chart, labs and discussed the procedure including the risks, benefits and alternatives for the proposed anesthesia with the patient or authorized representative who has indicated his/her understanding and acceptance.     Plan Discussed with: CRNA  Anesthesia Plan Comments:         Anesthesia Quick Evaluation

## 2018-02-04 ENCOUNTER — Encounter: Payer: Self-pay | Admitting: Gastroenterology

## 2018-02-04 LAB — SURGICAL PATHOLOGY

## 2018-02-11 ENCOUNTER — Other Ambulatory Visit: Payer: Self-pay

## 2018-02-11 DIAGNOSIS — K635 Polyp of colon: Secondary | ICD-10-CM

## 2018-02-12 ENCOUNTER — Telehealth: Payer: Self-pay | Admitting: Gastroenterology

## 2018-02-12 NOTE — Telephone Encounter (Signed)
Patient LVM returning your call 

## 2018-02-14 ENCOUNTER — Encounter: Payer: Self-pay | Admitting: Surgery

## 2018-02-14 ENCOUNTER — Ambulatory Visit (INDEPENDENT_AMBULATORY_CARE_PROVIDER_SITE_OTHER): Payer: Non-veteran care | Admitting: Surgery

## 2018-02-14 VITALS — BP 184/78 | HR 78 | Temp 98.1°F | Wt 206.0 lb

## 2018-02-14 DIAGNOSIS — K620 Anal polyp: Secondary | ICD-10-CM | POA: Diagnosis not present

## 2018-02-14 DIAGNOSIS — K642 Third degree hemorrhoids: Secondary | ICD-10-CM | POA: Diagnosis not present

## 2018-02-14 NOTE — Patient Instructions (Signed)
Please give [place] a call in case you have any questions or concerns.  We will contact Dr. Vicente Males to make sure that we schedule the right surgery.

## 2018-02-14 NOTE — Progress Notes (Signed)
Surgical Clinic History and Physical  Referring provider:  [place], [place] [address]  HISTORY OF PRESENT ILLNESS (HPI):  67 y.o. male presents for evaluation of an anal polyp discovered during his recent colonoscopy (Dr. Vicente Males, 02/01/2018). The 10 mm sessile polyp is described as visualized over a hemorrhoid and was accordingly not biopsied at the time of patient's colonoscopy. Patient reports he has had hemorrhoids "for years", which cause him pain, occasionally bleed with constipation, and one of which cannot be pushed back inside. Patient also complains that the prolapsed hemorrhoid(s) prevents him from feeling finished with a BM and limits his ability to clean himself. He says his chronic intermittent constipation has recently been well-controlled, denies fever/chills or CP, uses 1 - 2 L supplemental oxygen via [place] for COPD, quit smoking "many years" ago. Patient adamantly says he is hoping he can have his highly symptomatic hemorrhoid(s) excised along with possible polyp.   PAST MEDICAL HISTORY (PMH):  Past Medical History:  Diagnosis Date  . CAD (coronary artery disease)   . Colitis   . COPD (chronic obstructive pulmonary disease) ([place])   . Emphysema lung ([place])   . GERD (gastroesophageal reflux disease)   . Hypertension   . MI (myocardial infarction) ([place]) 1999     PAST SURGICAL HISTORY ([place]):  Past Surgical History:  Procedure Laterality Date  . ABDOMINAL HERNIA REPAIR Bilateral   . CHOLECYSTECTOMY    . COLONOSCOPY WITH PROPOFOL N/A 02/01/2018   Procedure: COLONOSCOPY WITH PROPOFOL;  Surgeon: Jonathon Bellows, MD;  Location: [facility] ENDOSCOPY;  Service: Gastroenterology;  Laterality: N/A;  . KNEE ARTHROSCOPY Bilateral      MEDICATIONS:  Prior to Admission medications   Medication Sig Start Date End Date Taking? Authorizing Provider  acetaminophen (TYLENOL) 325 MG tablet Take 975 mg by mouth 3 (three) times daily.   Yes [provider]   amitriptyline (ELAVIL) 10 MG tablet Take 30 mg by mouth at bedtime.   Yes [provider]  atorvastatin (LIPITOR) 80 MG tablet Take 1 tablet (80 mg total) by mouth at bedtime. 11/18/17  Yes Vaughan Basta, MD  budesonide-formoterol [facility]) 160-4.5 MCG/ACT inhaler Inhale 2 puffs into the lungs 2 (two) times daily.   Yes [provider]  busPIRone (BUSPAR) 10 MG tablet Take 10 mg by mouth 2 (two) times daily.   Yes [provider]  clobetasol cream (TEMOVATE) 2.75 % Apply 1 application topically daily as needed. For itching on psoriasis areas   Yes [provider]  diclofenac sodium (VOLTAREN) 1 % GEL Apply 2 g topically 2 (two) times daily.   Yes [provider]  dicyclomine (BENTYL) 20 MG tablet Take 1 tablet (20 mg total) by mouth every 6 (six) hours as needed. 12/11/17  Yes Paulette Blanch, MD  finasteride (PROSCAR) 5 MG tablet Take 5 mg by mouth daily.   Yes [provider]  fluticasone (FLONASE) 50 MCG/ACT nasal spray Place 2 sprays into both nostrils daily.   Yes [provider]  Ipratropium-Albuterol (COMBIVENT RESPIMAT) 20-100 MCG/ACT AERS respimat Inhale 1 puff into the lungs every 6 (six) hours as needed for wheezing or shortness of breath.   Yes [provider]  isosorbide mononitrate (IMDUR) 60 MG 24 hr tablet Take 60 mg by mouth daily.   Yes [provider]  lactulose (CHRONULAC) 10 GM/15ML solution Take 30 mLs (20 g total) by mouth daily as needed for mild constipation. 12/11/17  Yes Paulette Blanch, MD  lisinopril (  PRINIVIL,ZESTRIL) 10 MG tablet Take 10 mg by mouth daily.   Yes [provider]  loratadine (CLARITIN) 10 MG tablet Take 10 mg by mouth daily.   Yes [provider]  metoprolol tartrate (LOPRESSOR) 100 MG tablet Take 1 tablet (100 mg total) by mouth 2 (two) times daily. 11/18/17  Yes Vaughan Basta, MD  omeprazole (PRILOSEC) 20 MG capsule Take 40 mg by mouth daily.    Yes [provider]     ALLERGIES:  Allergies  Allergen Reactions  . Zantac [Ranitidine Hcl] Shortness Of Breath  . Codeine Itching  . Erythromycin Hives  . Vicodin [Hydrocodone-Acetaminophen] Rash     SOCIAL HISTORY:  Social History   Socioeconomic History  . Marital status: Married    Spouse name: Not on file  . Number of children: Not on file  . Years of education: Not on file  . Highest education level: Not on file  Social Needs  . Financial resource strain: Not on file  . Food insecurity - worry: Not on file  . Food insecurity - inability: Not on file  . Transportation needs - medical: Not on file  . Transportation needs - non-medical: Not on file  Occupational History  . Not on file  Tobacco Use  . Smoking status: Former Research scientist (life sciences)  . Smokeless tobacco: Never Used  Substance and Sexual Activity  . Alcohol use: No  . Drug use: No  . Sexual activity: No  Other Topics Concern  . Not on file  Social History Narrative  . Not on file    The patient currently resides (home / rehab facility / nursing home): Home The patient normally is (ambulatory / bedbound): Ambulatory  FAMILY HISTORY:  Family History  Problem Relation Age of Onset  . CAD Father   . Stroke Father   . CAD Sister   . Stroke Brother     Otherwise negative/non-contributory.  REVIEW OF SYSTEMS:  Constitutional: denies any other weight loss, fever, chills, or sweats  Eyes: denies any other vision changes, history of eye injury  ENT: denies sore throat, hearing problems  Respiratory: denies shortness of breath, wheezing  Cardiovascular: denies chest pain, palpitations  Gastrointestinal: denies abdominal pain or N/V, bowel function as per HPI Musculoskeletal: denies any other joint pains or cramps  Skin: Denies any other rashes or skin discolorations Neurological: denies any other headache, dizziness, weakness  Psychiatric: Denies any other depression, anxiety   All other review of  systems were otherwise negative   VITAL SIGNS:  BP (!) 184/78   Pulse 78   Temp 98.1 F (36.7 C) (Oral)   Wt 206 lb (93.4 kg)   BMI 33.25 kg/m    PHYSICAL EXAM:  Constitutional:  -- Overweight body habitus  -- Awake, alert, and oriented x3  Eyes:  -- Pupils equally round and reactive to light  -- No scleral icterus  Ear, nose, throat:  -- No jugular venous distension -- No nasal drainage, bleeding Pulmonary:  -- No crackles  -- Equal breath sounds bilaterally -- Supplemental oxygen ongoing via [place] -- Breathing non-labored at rest Cardiovascular:  -- S1, S2 present  -- No pericardial rubs  Gastrointestinal:  -- Abdomen soft, nontender, non-distended, no guarding/rebound  -- No abdominal masses appreciated, pulsatile or otherwise  Anorectal: -- Prolapsed grade 3 internal hemorrhoids x 2 (Right and Left posterior) -- No gross blood appreciated, anal sphincter tone WNL, discomfort without specific tenderness to palpation Musculoskeletal and Integumentary:  -- Wounds or skin discoloration: None  appreciated except as described above (anorectal) -- Extremities: B/L UE and LE FROM, hands and feet warm  Neurologic:  -- Motor function: Intact and symmetric -- Sensation: Intact and symmetric  Labs:  CBC Latest Ref Rng & Units 12/10/2017 11/17/2017 09/22/2017  WBC 3.8 - 10.6 K/uL 9.7 8.8 8.7  Hemoglobin 13.0 - 18.0 g/dL 15.1 16.4 15.7  Hematocrit 40.0 - 52.0 % 46.0 49.7 46.7  Platelets 150 - 440 K/uL 228 218 216   CMP Latest Ref Rng & Units 12/10/2017 11/18/2017 11/17/2017  Glucose 65 - 99 mg/dL 109(H) 140(H) 189(H)  BUN 6 - 20 mg/dL 9 7 7   Creatinine 0.61 - 1.24 mg/dL 0.94 0.84 0.99  Sodium 135 - 145 mmol/L 138 137 140  Potassium 3.5 - 5.1 mmol/L 3.2(L) 3.6 2.6(LL)  Chloride 101 - 111 mmol/L 100(L) 100(L) 99(L)  CO2 22 - 32 mmol/L 27 29 29   Calcium 8.9 - 10.3 mg/dL 8.7(L) 9.3 9.5  Total Protein 6.5 - 8.1 g/dL 7.0 - -  Total Bilirubin 0.3 - 1.2 mg/dL 0.6 - -  Alkaline Phos  38 - 126 U/L 120 - -  AST 15 - 41 U/L 18 - -  ALT 17 - 63 U/L 15(L) - -    Imaging studies:  Colonoscopy (Dr. Vicente Males, 02/14/2018) - Diverticulosis in the sigmoid colon. - Two 5 to 7 mm polyps in the cecum, removed with a cold snare. Resected and retrieved. - One 10 mm polyp at the anus, referred to general surgery to resect polyp vs skin tag over hemorroid. - The examination was otherwise normal on direct and retroflexion views.  Assessment/Plan:  67 y.o. male with anal polyp vs skin tag overlying symptomatic (painful, prolapsed) grade 3 internal hemorrhoids, complicated by co-morbidities including obesity (BMI >33), HTN, CAD s/p MI, COPD on home supplemental oxygen via nasal cannula, and GERD.   - discussed with Dr. Vicente Males, who confirms referral for hemorrhoidectomy with anal polypectomy  - all risks, benefits, and alternatives to hemorrhoidectomy including excisional biopsy of overlying anal polyp vs skin tag were discussed with the patient and his wife, all of their questions were answered to their expressed satisfaction, patient expresses he wishes to proceed, and informed consent was obtained.  - will plan for hemorrhoidectomy with excisional biopsy of overlying anal polyp vs skin tag under moderate sedation (comparable to that well-tolerated for recent colonoscopy) for painful grade 3 hemorrhoids   - will call patient to confirm surgical scheduling pending [facility] approval  - return to clinic 2 weeks following above procedure  - instructed to call if any questions or concerns  All of the above recommendations were discussed with the patient and patient's wife, and all of patient's and family's questions were answered to their expressed satisfaction.  Thank you for the opportunity to participate in this patient's care.  -- Marilynne Drivers Rosana Hoes, MD, [place]: [facility] - Partnering for exceptional care. Office: [phone]

## 2018-03-15 ENCOUNTER — Telehealth: Payer: Self-pay

## 2018-03-15 NOTE — Telephone Encounter (Signed)
Spoke to [name], [place] @ [employer] concerning the processing of Darius Norris's additional authorization for general surgery for the removal of polyp.  She states to contact her next week. She has received all the information and is forwarding it to the Surgery Department for clearance.

## 2018-03-20 ENCOUNTER — Telehealth: Payer: Self-pay

## 2018-03-20 NOTE — Telephone Encounter (Signed)
Darius Norris states the [facility] contacted him for the surgery. The [facility] team decided to offer the surgery to the patient.   He had an appointment last week Friday.  Patient states that he appreciates Dr. Rosana Hoes' time and effort and will keep him in mind for any future surgeries.  Sent message to Dr. Rosana Hoes & Vicente Males.

## 2018-03-21 ENCOUNTER — Encounter: Payer: Self-pay | Admitting: Gastroenterology

## 2018-05-02 ENCOUNTER — Other Ambulatory Visit: Payer: Self-pay

## 2018-05-02 ENCOUNTER — Emergency Department: Payer: Medicare HMO

## 2018-05-02 ENCOUNTER — Encounter: Payer: Self-pay | Admitting: Emergency Medicine

## 2018-05-02 ENCOUNTER — Inpatient Hospital Stay
Admission: EM | Admit: 2018-05-02 | Discharge: 2018-05-04 | DRG: 308 | Disposition: A | Discharge status: Home / Self Care | Payer: Medicare HMO | Attending: Internal Medicine | Admitting: Internal Medicine

## 2018-05-02 DIAGNOSIS — L409 Psoriasis, unspecified: Secondary | ICD-10-CM | POA: Diagnosis present

## 2018-05-02 DIAGNOSIS — Z7982 Long term (current) use of aspirin: Secondary | ICD-10-CM

## 2018-05-02 DIAGNOSIS — J439 Emphysema, unspecified: Secondary | ICD-10-CM | POA: Diagnosis present

## 2018-05-02 DIAGNOSIS — R011 Cardiac murmur, unspecified: Secondary | ICD-10-CM | POA: Diagnosis present

## 2018-05-02 DIAGNOSIS — Z888 Allergy status to other drugs, medicaments and biological substances status: Secondary | ICD-10-CM

## 2018-05-02 DIAGNOSIS — J9621 Acute and chronic respiratory failure with hypoxia: Secondary | ICD-10-CM | POA: Diagnosis present

## 2018-05-02 DIAGNOSIS — I10 Essential (primary) hypertension: Secondary | ICD-10-CM | POA: Diagnosis present

## 2018-05-02 DIAGNOSIS — Z8249 Family history of ischemic heart disease and other diseases of the circulatory system: Secondary | ICD-10-CM | POA: Diagnosis not present

## 2018-05-02 DIAGNOSIS — K219 Gastro-esophageal reflux disease without esophagitis: Secondary | ICD-10-CM | POA: Diagnosis present

## 2018-05-02 DIAGNOSIS — M549 Dorsalgia, unspecified: Secondary | ICD-10-CM | POA: Diagnosis present

## 2018-05-02 DIAGNOSIS — I2510 Atherosclerotic heart disease of native coronary artery without angina pectoris: Secondary | ICD-10-CM | POA: Diagnosis present

## 2018-05-02 DIAGNOSIS — I4891 Unspecified atrial fibrillation: Secondary | ICD-10-CM | POA: Diagnosis present

## 2018-05-02 DIAGNOSIS — Z7951 Long term (current) use of inhaled steroids: Secondary | ICD-10-CM

## 2018-05-02 DIAGNOSIS — E669 Obesity, unspecified: Secondary | ICD-10-CM | POA: Diagnosis present

## 2018-05-02 DIAGNOSIS — Z6832 Body mass index (BMI) 32.0-32.9, adult: Secondary | ICD-10-CM | POA: Diagnosis not present

## 2018-05-02 DIAGNOSIS — Z9049 Acquired absence of other specified parts of digestive tract: Secondary | ICD-10-CM | POA: Diagnosis not present

## 2018-05-02 DIAGNOSIS — Z881 Allergy status to other antibiotic agents status: Secondary | ICD-10-CM

## 2018-05-02 DIAGNOSIS — Z87891 Personal history of nicotine dependence: Secondary | ICD-10-CM

## 2018-05-02 DIAGNOSIS — I252 Old myocardial infarction: Secondary | ICD-10-CM | POA: Diagnosis not present

## 2018-05-02 DIAGNOSIS — R079 Chest pain, unspecified: Secondary | ICD-10-CM

## 2018-05-02 DIAGNOSIS — E876 Hypokalemia: Secondary | ICD-10-CM | POA: Diagnosis present

## 2018-05-02 DIAGNOSIS — Z9981 Dependence on supplemental oxygen: Secondary | ICD-10-CM

## 2018-05-02 DIAGNOSIS — Z79899 Other long term (current) drug therapy: Secondary | ICD-10-CM

## 2018-05-02 DIAGNOSIS — Z885 Allergy status to narcotic agent status: Secondary | ICD-10-CM

## 2018-05-02 DIAGNOSIS — Z823 Family history of stroke: Secondary | ICD-10-CM | POA: Diagnosis not present

## 2018-05-02 LAB — CBC
HCT: 48.4 % (ref 40.0–52.0)
HEMATOCRIT: 45.5 % (ref 40.0–52.0)
HEMOGLOBIN: 16.2 g/dL (ref 13.0–18.0)
HEMOGLOBIN: 15.1 g/dL (ref 13.0–18.0)
MCH: 29.3 pg (ref 26.0–34.0)
MCH: 28.9 pg (ref 26.0–34.0)
MCHC: 33.5 g/dL (ref 32.0–36.0)
MCHC: 33.2 g/dL (ref 32.0–36.0)
MCV: 87.2 fL (ref 80.0–100.0)
MCV: 87 fL (ref 80.0–100.0)
PLATELETS: 222 10*3/uL (ref 150–440)
Platelets: 241 10*3/uL (ref 150–440)
RBC: 5.55 MIL/uL (ref 4.40–5.90)
RBC: 5.22 MIL/uL (ref 4.40–5.90)
RDW: 16.5 % — ABNORMAL HIGH (ref 11.5–14.5)
RDW: 16.9 % — AB (ref 11.5–14.5)
WBC: 9.5 10*3/uL (ref 3.8–10.6)
WBC: 8.9 10*3/uL (ref 3.8–10.6)

## 2018-05-02 LAB — COMPREHENSIVE METABOLIC PANEL
ALBUMIN: 4.1 g/dL (ref 3.5–5.0)
ALK PHOS: 124 U/L (ref 38–126)
ALT: 18 U/L (ref 17–63)
AST: 20 U/L (ref 15–41)
Anion gap: 10 (ref 5–15)
BILIRUBIN TOTAL: 0.7 mg/dL (ref 0.3–1.2)
BUN: 8 mg/dL (ref 6–20)
CO2: 31 mmol/L (ref 22–32)
Calcium: 9.2 mg/dL (ref 8.9–10.3)
Chloride: 98 mmol/L — ABNORMAL LOW (ref 101–111)
Creatinine, Ser: 0.92 mg/dL (ref 0.61–1.24)
GFR calc Af Amer: >60 mL/min (ref >60)
GFR calc non Af Amer: >60 mL/min (ref >60)
GLUCOSE: 133 mg/dL — AB (ref 65–99)
POTASSIUM: 2.9 mmol/L — AB (ref 3.5–5.1)
Sodium: 139 mmol/L (ref 135–145)
TOTAL PROTEIN: 7.6 g/dL (ref 6.5–8.1)

## 2018-05-02 LAB — TROPONIN I
TROPONIN I: 0.05 ng/mL — AB (ref <0.03)
Troponin I: <0.03 ng/mL (ref <0.03)
Troponin I: 0.06 ng/mL (ref <0.03)
Troponin I: 0.06 ng/mL (ref <0.03)

## 2018-05-02 LAB — APTT: aPTT: 32 seconds (ref 24–36)

## 2018-05-02 LAB — PROTIME-INR
INR: 0.96
PROTHROMBIN TIME: 12.7 s (ref 11.4–15.2)

## 2018-05-02 LAB — MAGNESIUM: Magnesium: 1.8 mg/dL (ref 1.7–2.4)

## 2018-05-02 LAB — BRAIN NATRIURETIC PEPTIDE: B Natriuretic Peptide: 168 pg/mL — ABNORMAL HIGH (ref 0.0–100.0)

## 2018-05-02 LAB — HEPARIN LEVEL (UNFRACTIONATED): Heparin Unfractionated: 0.16 IU/mL — ABNORMAL LOW (ref 0.30–0.70)

## 2018-05-02 MED ORDER — BUSPIRONE HCL 10 MG PO TABS
10.0000 mg | ORAL_TABLET | Freq: Two times a day (BID) | ORAL | Status: DC
  Administered 2018-05-02 – 2018-05-04 (×4): 10 mg via ORAL
  Filled 2018-05-02 (×4): qty 1
  Filled 2018-05-02: qty 2

## 2018-05-02 MED ORDER — ACETAMINOPHEN 325 MG PO TABS: 650.0000 mg | ORAL_TABLET | ORAL | Status: DC | PRN

## 2018-05-02 MED ORDER — IPRATROPIUM-ALBUTEROL 20-100 MCG/ACT IN AERS: 1.0000 | INHALATION_SPRAY | Freq: Four times a day (QID) | RESPIRATORY_TRACT | Status: DC | PRN

## 2018-05-02 MED ORDER — DILTIAZEM HCL 25 MG/5ML IV SOLN
10.0000 mg | Freq: Once | INTRAVENOUS | Status: AC
  Administered 2018-05-02: 10 mg via INTRAVENOUS
  Filled 2018-05-02: qty 5

## 2018-05-02 MED ORDER — METOPROLOL TARTRATE 5 MG/5ML IV SOLN
2.5000 mg | Freq: Once | INTRAVENOUS | Status: AC
  Administered 2018-05-02: 2.5 mg via INTRAVENOUS

## 2018-05-02 MED ORDER — ASPIRIN 325 MG PO TABS
325.0000 mg | ORAL_TABLET | Freq: Every day | ORAL | Status: DC
  Administered 2018-05-03 – 2018-05-04 (×2): 325 mg via ORAL
  Filled 2018-05-02 (×2): qty 1

## 2018-05-02 MED ORDER — ISOSORBIDE MONONITRATE ER 60 MG PO TB24
60.0000 mg | ORAL_TABLET | Freq: Every day | ORAL | Status: DC
  Administered 2018-05-02 – 2018-05-04 (×3): 60 mg via ORAL
  Filled 2018-05-02 (×3): qty 1

## 2018-05-02 MED ORDER — DILTIAZEM HCL 100 MG IV SOLR
5.0000 mg/h | Freq: Once | INTRAVENOUS | Status: AC
  Administered 2018-05-02: 5 mg/h via INTRAVENOUS
  Filled 2018-05-02: qty 100

## 2018-05-02 MED ORDER — ACETAMINOPHEN 325 MG PO TABS
650.0000 mg | ORAL_TABLET | Freq: Four times a day (QID) | ORAL | Status: DC | PRN
Start: 2018-05-02 — End: 2018-05-04
  Administered 2018-05-02 – 2018-05-04 (×7): 650 mg via ORAL
  Filled 2018-05-02 (×7): qty 2

## 2018-05-02 MED ORDER — CLOBETASOL PROPIONATE 0.05 % EX CREA: 1.0000 "application " | TOPICAL_CREAM | Freq: Every day | CUTANEOUS | Status: DC | PRN

## 2018-05-02 MED ORDER — IPRATROPIUM-ALBUTEROL 0.5-2.5 (3) MG/3ML IN SOLN
RESPIRATORY_TRACT | Status: AC
  Filled 2018-05-02: qty 3

## 2018-05-02 MED ORDER — METOPROLOL TARTRATE 5 MG/5ML IV SOLN
5.0000 mg | Freq: Once | INTRAVENOUS | Status: AC
  Administered 2018-05-02: 5 mg via INTRAVENOUS
  Filled 2018-05-02: qty 5

## 2018-05-02 MED ORDER — PANTOPRAZOLE SODIUM 40 MG PO TBEC
40.0000 mg | DELAYED_RELEASE_TABLET | Freq: Every day | ORAL | Status: DC
  Administered 2018-05-02 – 2018-05-04 (×3): 40 mg via ORAL
  Filled 2018-05-02 (×3): qty 1

## 2018-05-02 MED ORDER — DILTIAZEM HCL 100 MG IV SOLR
5.0000 mg/h | INTRAVENOUS | Status: DC
  Administered 2018-05-02: 10 mg/h via INTRAVENOUS
  Administered 2018-05-02: 7.5 mg/h via INTRAVENOUS

## 2018-05-02 MED ORDER — METHYLPREDNISOLONE SODIUM SUCC 125 MG IJ SOLR
125.0000 mg | Freq: Once | INTRAMUSCULAR | Status: AC
  Administered 2018-05-02: 125 mg via INTRAVENOUS
  Filled 2018-05-02: qty 2

## 2018-05-02 MED ORDER — IPRATROPIUM-ALBUTEROL 0.5-2.5 (3) MG/3ML IN SOLN: 3.0000 mL | Freq: Four times a day (QID) | RESPIRATORY_TRACT | Status: DC | PRN

## 2018-05-02 MED ORDER — MORPHINE SULFATE (PF) 2 MG/ML IV SOLN: 2.0000 mg | INTRAVENOUS | Status: DC | PRN

## 2018-05-02 MED ORDER — FLUTICASONE PROPIONATE 50 MCG/ACT NA SUSP
2.0000 | Freq: Every day | NASAL | Status: DC | PRN
  Filled 2018-05-02: qty 16

## 2018-05-02 MED ORDER — METOPROLOL TARTRATE 50 MG PO TABS
100.0000 mg | ORAL_TABLET | Freq: Two times a day (BID) | ORAL | Status: DC
  Administered 2018-05-03 – 2018-05-04 (×2): 100 mg via ORAL
  Filled 2018-05-02 (×3): qty 2

## 2018-05-02 MED ORDER — METOPROLOL TARTRATE 50 MG PO TABS: 100.0000 mg | ORAL_TABLET | Freq: Two times a day (BID) | ORAL | Status: DC

## 2018-05-02 MED ORDER — GI COCKTAIL ~~LOC~~
30.0000 mL | Freq: Four times a day (QID) | ORAL | Status: DC | PRN
  Filled 2018-05-02: qty 30

## 2018-05-02 MED ORDER — METOPROLOL TARTRATE 50 MG PO TABS: 75.0000 mg | ORAL_TABLET | Freq: Two times a day (BID) | ORAL | Status: DC

## 2018-05-02 MED ORDER — BUSPIRONE HCL 5 MG PO TABS
10.0000 mg | ORAL_TABLET | Freq: Two times a day (BID) | ORAL | Status: DC
  Administered 2018-05-02: 10 mg via ORAL
  Filled 2018-05-02: qty 2
  Filled 2018-05-02 (×4): qty 1

## 2018-05-02 MED ORDER — MOMETASONE FURO-FORMOTEROL FUM 200-5 MCG/ACT IN AERO
2.0000 | INHALATION_SPRAY | Freq: Two times a day (BID) | RESPIRATORY_TRACT | Status: DC
  Administered 2018-05-02 – 2018-05-04 (×4): 2 via RESPIRATORY_TRACT
  Filled 2018-05-02: qty 8.8

## 2018-05-02 MED ORDER — IPRATROPIUM-ALBUTEROL 0.5-2.5 (3) MG/3ML IN SOLN
3.0000 mL | Freq: Once | RESPIRATORY_TRACT | Status: AC
  Administered 2018-05-02: 3 mL via RESPIRATORY_TRACT
  Filled 2018-05-02: qty 9

## 2018-05-02 MED ORDER — AMITRIPTYLINE HCL 10 MG PO TABS
15.0000 mg | ORAL_TABLET | Freq: Every day | ORAL | Status: DC
Start: 2018-05-02 — End: 2018-05-04
  Administered 2018-05-02 – 2018-05-03 (×2): 15 mg via ORAL
  Filled 2018-05-02 (×3): qty 1.5

## 2018-05-02 MED ORDER — POTASSIUM CHLORIDE 20 MEQ PO PACK
60.0000 meq | PACK | Freq: Once | ORAL | Status: AC
  Administered 2018-05-02: 60 meq via ORAL
  Filled 2018-05-02: qty 3

## 2018-05-02 MED ORDER — ALPRAZOLAM 0.25 MG PO TABS
0.2500 mg | ORAL_TABLET | Freq: Two times a day (BID) | ORAL | Status: DC | PRN
  Administered 2018-05-03 (×2): 0.25 mg via ORAL
  Filled 2018-05-02 (×2): qty 1

## 2018-05-02 MED ORDER — LORATADINE 10 MG PO TABS
10.0000 mg | ORAL_TABLET | Freq: Every day | ORAL | Status: DC | PRN
Start: 2018-05-02 — End: 2018-05-04

## 2018-05-02 MED ORDER — HEPARIN BOLUS VIA INFUSION
4000.0000 [IU] | Freq: Once | INTRAVENOUS | Status: AC
  Administered 2018-05-02: 4000 [IU] via INTRAVENOUS
  Filled 2018-05-02: qty 4000

## 2018-05-02 MED ORDER — HEPARIN BOLUS VIA INFUSION
2500.0000 [IU] | Freq: Once | INTRAVENOUS | Status: AC
  Administered 2018-05-02: 2500 [IU] via INTRAVENOUS
  Filled 2018-05-02: qty 2500

## 2018-05-02 MED ORDER — HEPARIN (PORCINE) IN NACL 100-0.45 UNIT/ML-% IJ SOLN
1250.0000 [IU]/h | INTRAMUSCULAR | Status: AC
Start: 2018-05-02 — End: 2018-05-03
  Administered 2018-05-02: 950 [IU]/h via INTRAVENOUS
  Administered 2018-05-03: 1250 [IU]/h via INTRAVENOUS
  Filled 2018-05-02 (×2): qty 250

## 2018-05-02 MED ORDER — LISINOPRIL 20 MG PO TABS
20.0000 mg | ORAL_TABLET | Freq: Every day | ORAL | Status: DC
  Administered 2018-05-03 – 2018-05-04 (×2): 20 mg via ORAL
  Filled 2018-05-02 (×2): qty 1

## 2018-05-02 MED ORDER — LACTULOSE 10 GM/15ML PO SOLN
20.0000 g | Freq: Every day | ORAL | Status: DC | PRN
  Administered 2018-05-03: 20 g via ORAL
  Filled 2018-05-02: qty 30

## 2018-05-02 MED ORDER — METOPROLOL TARTRATE 50 MG PO TABS
100.0000 mg | ORAL_TABLET | Freq: Once | ORAL | Status: AC
  Administered 2018-05-02: 100 mg via ORAL
  Filled 2018-05-02: qty 2

## 2018-05-02 MED ORDER — NITROGLYCERIN 0.4 MG SL SUBL: 0.4000 mg | SUBLINGUAL_TABLET | SUBLINGUAL | Status: DC | PRN

## 2018-05-02 MED ORDER — DICLOFENAC SODIUM 1 % TD GEL
2.0000 g | Freq: Two times a day (BID) | TRANSDERMAL | Status: DC | PRN
  Filled 2018-05-02: qty 100

## 2018-05-02 MED ORDER — ATORVASTATIN CALCIUM 20 MG PO TABS
80.0000 mg | ORAL_TABLET | Freq: Every day | ORAL | Status: DC
  Administered 2018-05-02 – 2018-05-03 (×2): 80 mg via ORAL
  Filled 2018-05-02 (×2): qty 4

## 2018-05-02 MED ORDER — ONDANSETRON HCL 4 MG/2ML IJ SOLN
4.0000 mg | Freq: Four times a day (QID) | INTRAMUSCULAR | Status: DC | PRN
  Administered 2018-05-04: 4 mg via INTRAVENOUS
  Filled 2018-05-02: qty 2

## 2018-05-02 MED ORDER — METOPROLOL TARTRATE 5 MG/5ML IV SOLN
INTRAVENOUS | Status: AC
  Filled 2018-05-02: qty 5

## 2018-05-02 MED ORDER — ASPIRIN 81 MG PO CHEW
324.0000 mg | CHEWABLE_TABLET | Freq: Once | ORAL | Status: AC
  Administered 2018-05-02: 324 mg via ORAL
  Filled 2018-05-02: qty 4

## 2018-05-02 NOTE — H&P (Signed)
[facility] [place] NAME: Darius Norris    MR#:  [id number]  DATE OF BIRTH:  1951-02-05  DATE OF ADMISSION:  05/02/2018  PRIMARY CARE PHYSICIAN: [facility] [place]   REQUESTING/REFERRING PHYSICIAN:   CHIEF COMPLAINT:   Chief Complaint  Patient presents with  . Chest Pain    HISTORY OF PRESENT ILLNESS: Darius Norris  is a 67 y.o. male with a known history per below presents emergency room with acute onset of heart palpitations with chest discomfort radiating into the back and across his chest that started around 5:55 AM this morning, in the emergency room patient was found to have A. fib with RVR on EKG, potassium was 2.9, troponin negative, EKG noted also to have ST segment depression with T wave inversions inferiorly/anteriorly/laterally, echo from December of last year noted for ejection fraction 50 to 55% with areas of hypokinesis, patient evaluated at the bedside, wife present, patient in no apparent distress, on Cardizem drip, patient now be admitted for acute A. fib with RVR with  Chest pain and hypokalemia. PAST MEDICAL HISTORY:   Past Medical History:  Diagnosis Date  . CAD (coronary artery disease)   . Colitis   . COPD (chronic obstructive pulmonary disease) ([place])   . Emphysema lung ([place])   . GERD (gastroesophageal reflux disease)   . Hypertension   . MI (myocardial infarction) ([place]) 1999    PAST SURGICAL HISTORY:  Past Surgical History:  Procedure Laterality Date  . ABDOMINAL HERNIA REPAIR Bilateral   . CHOLECYSTECTOMY    . COLONOSCOPY WITH PROPOFOL N/A 02/01/2018   Procedure: COLONOSCOPY WITH PROPOFOL;  Surgeon: Jonathon Bellows, MD;  Location: [facility] ENDOSCOPY;  Service: Gastroenterology;  Laterality: N/A;  . KNEE ARTHROSCOPY Bilateral     SOCIAL HISTORY:  Social History   Tobacco Use  . Smoking status: Former Research scientist (life sciences)  . Smokeless tobacco: Never Used  Substance Use Topics  . Alcohol use: No    FAMILY HISTORY:   Family History  Problem Relation Age of Onset  . CAD Father   . Stroke Father   . CAD Sister   . Stroke Brother     DRUG ALLERGIES:  Allergies  Allergen Reactions  . Zantac [Ranitidine Hcl] Shortness Of Breath  . Codeine Itching  . Erythromycin Hives  . Vicodin [Hydrocodone-Acetaminophen] Rash    REVIEW OF SYSTEMS:   CONSTITUTIONAL: No fever,+ fatigue / weakness.  EYES: No blurred or double vision.  EARS, NOSE, AND THROAT: No tinnitus or ear pain.  RESPIRATORY: No cough, +shortness of breath, no wheezing or hemoptysis.  CARDIOVASCULAR:+o chest pain, no orthopnea, edema.  GASTROINTESTINAL: No nausea, vomiting, diarrhea or abdominal pain.  GENITOURINARY: No dysuria, hematuria.  ENDOCRINE: No polyuria, nocturia,  HEMATOLOGY: No anemia, easy bruising or bleeding SKIN: No rash or lesion. MUSCULOSKELETAL: No joint pain or arthritis.   NEUROLOGIC: No tingling, numbness, weakness.  PSYCHIATRY: No anxiety or depression.   MEDICATIONS AT HOME:  Prior to Admission medications   Medication Sig Start Date End Date Taking? Authorizing Provider  acetaminophen (TYLENOL) 325 MG tablet Take 650 mg by mouth every 6 (six) hours as needed.    Yes [provider]  amitriptyline (ELAVIL) 10 MG tablet Take 15 mg by mouth at bedtime.    Yes [provider]  aspirin 325 MG tablet Take 325 mg by mouth daily.   Yes [provider]  atorvastatin (LIPITOR) 80 MG tablet Take 1 tablet (80 mg total) by mouth at  bedtime. 11/18/17  Yes Vaughan Basta, MD  budesonide-formoterol [facility]) 160-4.5 MCG/ACT inhaler Inhale 2 puffs into the lungs 2 (two) times daily.   Yes [provider]  busPIRone (BUSPAR) 10 MG tablet Take 10 mg by mouth 2 (two) times daily.   Yes [provider]  isosorbide mononitrate (IMDUR) 60 MG 24 hr tablet Take 60 mg by mouth daily.   Yes [provider]  lisinopril (PRINIVIL,ZESTRIL) 40 MG tablet Take 20 mg by mouth daily.     Yes [provider]  metoprolol tartrate (LOPRESSOR) 100 MG tablet Take 1 tablet (100 mg total) by mouth 2 (two) times daily. Patient taking differently: Take 50 mg by mouth 2 (two) times daily.  11/18/17  Yes Vaughan Basta, MD  omeprazole (PRILOSEC) 20 MG capsule Take 20 mg by mouth daily.    Yes [provider]  clobetasol cream (TEMOVATE) 7.98 % Apply 1 application topically daily as needed. For itching on psoriasis areas    [provider]  diclofenac sodium (VOLTAREN) 1 % GEL Apply 2 g topically 2 (two) times daily.    [provider]  fluticasone (FLONASE) 50 MCG/ACT nasal spray Place 2 sprays into both nostrils daily.    [provider]  Ipratropium-Albuterol (COMBIVENT RESPIMAT) 20-100 MCG/ACT AERS respimat Inhale 1 puff into the lungs every 6 (six) hours as needed for wheezing or shortness of breath.    [provider]  lactulose (CHRONULAC) 10 GM/15ML solution Take 30 mLs (20 g total) by mouth daily as needed for mild constipation. 12/11/17   Paulette Blanch, MD  loratadine (CLARITIN) 10 MG tablet Take 10 mg by mouth daily as needed for allergies.     [provider]      PHYSICAL EXAMINATION:   VITAL SIGNS: Blood pressure (!) 140/96, pulse 91, resp. rate 20, height 5\' 6"  (1.676 m), weight 95.3 kg (210 lb), SpO2 95 %.  GENERAL:  67 y.o.-year-old patient lying in the bed with no acute distress.  Obese EYES: Pupils equal, round, reactive to light and accommodation. No scleral icterus. Extraocular muscles intact.  HEENT: Head atraumatic, normocephalic. Oropharynx and nasopharynx clear.  NECK:  Supple, no jugular venous distention. No thyroid enlargement, no tenderness.  LUNGS: Normal breath sounds bilaterally, no wheezing, rales,rhonchi or crepitation. No use of accessory muscles of respiration.  CARDIOVASCULAR: Regular rate and rhythm. No murmurs, rubs, or gallops.  ABDOMEN: Soft, nontender, nondistended. Bowel  sounds present. No organomegaly or mass.  EXTREMITIES: No pedal edema, cyanosis, or clubbing.  NEUROLOGIC: Cranial nerves II through XII are intact. MAES. Gait not checked.  PSYCHIATRIC: The patient is alert and oriented x 3.  SKIN: No obvious rash, lesion, or ulcer.   LABORATORY PANEL:   CBC Recent Labs  Lab 05/02/18 0827  WBC 9.5  HGB 16.2  HCT 48.4  PLT 222  MCV 87.2  MCH 29.3  MCHC 33.5  RDW 16.5*   ------------------------------------------------------------------------------------------------------------------  Chemistries  Recent Labs  Lab 05/02/18 0827  NA 139  K 2.9*  CL 98*  CO2 31  GLUCOSE 133*  BUN 8  CREATININE 0.92  CALCIUM 9.2  AST 20  ALT 18  ALKPHOS 124  BILITOT 0.7   ------------------------------------------------------------------------------------------------------------------ estimated creatinine clearance is 85.4 mL/min (by C-G formula based on SCr of 0.92 mg/dL). ------------------------------------------------------------------------------------------------------------------ No results for input(s): TSH, T4TOTAL, T3FREE, THYROIDAB in the last 72 hours.  Invalid input(s): FREET3   Coagulation profile No results for input(s): INR, PROTIME in the last 168 hours. ------------------------------------------------------------------------------------------------------------------- No  results for input(s): DDIMER in the last 72 hours. -------------------------------------------------------------------------------------------------------------------  Cardiac Enzymes Recent Labs  Lab 05/02/18 0827  TROPONINI <0.03   ------------------------------------------------------------------------------------------------------------------ Invalid input(s): POCBNP  ---------------------------------------------------------------------------------------------------------------  Urinalysis    Component Value Date/Time   COLORURINE YELLOW (A)  12/10/2017 2248   APPEARANCEUR CLEAR (A) 12/10/2017 2248   APPEARANCEUR Clear 02/04/2013 1430   LABSPEC 1.009 12/10/2017 2248   LABSPEC 1.004 02/04/2013 1430   PHURINE 6.0 12/10/2017 2248   GLUCOSEU NEGATIVE 12/10/2017 2248   GLUCOSEU Negative 02/04/2013 1430   HGBUR NEGATIVE 12/10/2017 2248   BILIRUBINUR NEGATIVE 12/10/2017 2248   BILIRUBINUR Negative 02/04/2013 1430   KETONESUR NEGATIVE 12/10/2017 2248   PROTEINUR NEGATIVE 12/10/2017 2248   NITRITE NEGATIVE 12/10/2017 2248   LEUKOCYTESUR NEGATIVE 12/10/2017 2248   LEUKOCYTESUR Negative 02/04/2013 1430     RADIOLOGY: Dg Chest Port 1 View  Result Date: 05/02/2018 CLINICAL DATA:  Tachycardia. EXAM: PORTABLE CHEST 1 VIEW COMPARISON:  Radiographs of November 17, 2017. FINDINGS: Stable cardiomediastinal silhouette. No pneumothorax or pleural effusion is noted. Emphysematous disease is noted in the upper lobes bilaterally. Stable bibasilar scarring is noted. No acute pulmonary disease is noted. Bony thorax is unremarkable. IMPRESSION: No acute cardiopulmonary abnormality. Emphysema (ICD10-J43.9). Electronically Signed   By: Marijo Conception, M.D.   On: 05/02/2018 09:04    EKG: Orders placed or performed during the hospital encounter of 05/02/18  . EKG 12-Lead  . EKG 12-Lead  . ED EKG  . ED EKG  . EKG 12-Lead  . EKG 12-Lead  . EKG 12-Lead  . EKG 12-Lead    IMPRESSION AND PLAN: *Acute A. fib with RVR with chest pain Exacerbated by hypokalemia Admit to telemetry bed, rule out acute coronary syndrome with cardiac enzymes x3 sets, increase beta-blocker therapy, Cardizem drip with weaning as tolerated, heparin drip as chads vas2 score 2, replete hypokalemia, check magnesium level, cardiology to see-followed by Dr. Saralyn Pilar, and continue close medical monitoring Most recent echocardiogram in December 2018 noted for ejection fraction 50-55% with multiple areas of hypokinesis  *Acute chest pain with coronary artery disease/history of  MI Most likely secondary to above Continue aspirin, statin therapy-check lipids in the morning, imdur, nitrates as needed, lisinopril, on heparin drip, supplemental oxygen, morphine as needed breakthrough pain  *Acute hypokalemia Replete with p.o. potassium, check magnesium level, BMP in the morning  *Acute on chronic hypoxic respiratory failure Stable On 2 L via nasal cannula as needed at home with activity  *Chronic GERD without esophagitis PPI daily  *COPD without exacerbation Stable Continue home regiment, breathing treatments as needed  *History of psoriasis  stable Continue clobetasol  Disposition Home in 1 to 2 days barring any complications  All the records are reviewed and case discussed with ED provider. Management plans discussed with the patient, family and they are in agreement.  CODE STATUS:full Code Status History    Date Active Date Inactive Code Status Order ID Comments User Context   11/17/2017 0837 11/18/2017 1635 Full Code [id number]  Harrie Foreman, MD Inpatient   09/04/2016 1959 09/05/2016 1736 Full Code [id number]  Theodoro Grist, MD Inpatient   12/15/2015 0158 12/15/2015 1922 Full Code [id number]  Lance Coon, MD Inpatient       TOTAL TIME TAKING CARE OF THIS PATIENT: 45 minutes.    Avel Peace Nemiah Kissner M.D on 05/02/2018   Between 7am to 6pm - Pager - [phone]  After 6pm go to [email or web address] - Proofreader  [employer]  [phone]  CC: Primary care physician; [facility] [place]   Note: This dictation was prepared with Dragon dictation along with smaller phrase technology. Any transcriptional errors that result from this process are unintentional.

## 2018-05-02 NOTE — Progress Notes (Signed)
MD was informed of pt's trop. Level 0.05, pt on heparin drip at this time Cardizem drip stop, as pt is now in NSR and  HR 60's , will continue to monitor

## 2018-05-02 NOTE — ED Notes (Signed)
Patient denies pain and is resting comfortably.  

## 2018-05-02 NOTE — ED Provider Notes (Addendum)
[facility] Emergency Department Provider Note       Time seen: ----------------------------------------- 8:26 AM on 05/02/2018 -----------------------------------------   I have reviewed the triage vital signs and the nursing notes.  HISTORY   Chief Complaint Chest Pain    HPI Darius Norris is a 67 y.o. male with a history of coronary artery disease, COPD, GERD, hypertension and MI who presents to the ED for chest pain and fast heartbeat.  Patient states he had a fast heart rate that started this morning, this happen 1 of the time and he was able to take a heart pill and slow it down but that did not work this morning.  He has had some left-sided chest pain radiating down his left arm and into his back.  He is on home oxygen as needed.  He denies fevers or chills, denies vomiting or diarrhea.  Past Medical History:  Diagnosis Date  . CAD (coronary artery disease)   . Colitis   . COPD (chronic obstructive pulmonary disease) ([place])   . Emphysema lung ([place])   . GERD (gastroesophageal reflux disease)   . Hypertension   . MI (myocardial infarction) [facility]) 1999    Patient Active Problem List   Diagnosis Date Noted  . Chest pain 11/17/2017  . Unstable angina ([place]) 09/04/2016  . Uncontrolled hypertension 09/04/2016  . Hypokalemia 09/04/2016  . Leukocytosis 09/04/2016  . Hyperglycemia 09/04/2016  . COPD exacerbation ([place]) 12/15/2015  . Elevated troponin 12/15/2015  . HTN (hypertension) 12/15/2015  . GERD (gastroesophageal reflux disease) 12/15/2015  . CAD (coronary artery disease) 12/15/2015  . Angina pectoris ([place]) 12/15/2015    Past Surgical History:  Procedure Laterality Date  . ABDOMINAL HERNIA REPAIR Bilateral   . CHOLECYSTECTOMY    . COLONOSCOPY WITH PROPOFOL N/A 02/01/2018   Procedure: COLONOSCOPY WITH PROPOFOL;  Surgeon: Jonathon Bellows, MD;  Location: [facility] ENDOSCOPY;  Service: Gastroenterology;  Laterality: N/A;  . KNEE ARTHROSCOPY Bilateral      Allergies Zantac [ranitidine hcl]; Codeine; Erythromycin; and Vicodin [hydrocodone-acetaminophen]  Social History Social History   Tobacco Use  . Smoking status: Former Research scientist (life sciences)  . Smokeless tobacco: Never Used  Substance Use Topics  . Alcohol use: No  . Drug use: No   Review of Systems Constitutional: Negative for fever. Cardiovascular: Positive for chest pain and palpitations Respiratory: Positive for shortness of breath Gastrointestinal: Negative for abdominal pain, vomiting and diarrhea. Musculoskeletal: Positive for back pain Skin: Negative for rash. Neurological: Negative for headaches, focal weakness or numbness.  All systems negative/normal/unremarkable except as stated in the HPI  ____________________________________________   PHYSICAL EXAM:  VITAL SIGNS: ED Triage Vitals [05/02/18 0816]  Enc Vitals Group     BP      Pulse      Resp      Temp      Temp src      SpO2      Weight 210 lb (95.3 kg)     Height 5\' 6"  (1.676 m)     Head Circumference      Peak Flow      Pain Score 5     Pain Loc      Pain Edu?      Excl. in [place]?    Constitutional: Alert and oriented.  Mild to moderate distress Eyes: Conjunctivae are normal. Normal extraocular movements. ENT   Head: Normocephalic and atraumatic.   Nose: No congestion/rhinnorhea.   Mouth/Throat: Mucous membranes are moist.   Neck: No stridor. Cardiovascular: Rapid  rate, regular rhythm. Respiratory: Diminished breath sounds, left greater than right, scattered wheezing and rales Gastrointestinal: Soft and nontender. Normal bowel sounds Musculoskeletal: Nontender with normal range of motion in extremities. No lower extremity tenderness nor edema. Neurologic:  Normal speech and language. No gross focal neurologic deficits are appreciated.  Skin:  Skin is warm, dry and intact. No rash noted. Psychiatric: Mood and affect are normal. Speech and behavior are normal.   ____________________________________________  EKG: Interpreted by me.  SVT with a rate of 146 bpm, LVH with repolarization abnormality, normal axis, long QT  Repeat EKG appears to be atrial fibrillation with a rapid ventricular response, rate is 137 bpm, ST segment depressions are noted, long QT  Third repeat EKG reveals Atrial fibrillation with a rate of 90 bpm, ST depressions are noted globally, normal QT, normal axis.  No chest pain during this EKG ____________________________________________  ED COURSE:  As part of my medical decision making, I reviewed the following data within the [facility] History obtained from family if available, nursing notes, old chart and ekg, as well as notes from prior ED visits. Patient presented for chest pain and tachycardia, we will assess with labs and imaging as indicated at this time.   Procedures ____________________________________________   LABS (pertinent positives/negatives)  Labs Reviewed  CBC - Abnormal; Notable for the following components:      Result Value   RDW 16.5 (*)    All other components within normal limits  COMPREHENSIVE METABOLIC PANEL - Abnormal; Notable for the following components:   Potassium 2.9 (*)    Chloride 98 (*)    Glucose, Bld 133 (*)    All other components within normal limits  BLOOD GAS, VENOUS - Abnormal; Notable for the following components:   Bicarbonate 36.3 (*)    Acid-Base Excess 8.8 (*)    All other components within normal limits  BRAIN NATRIURETIC PEPTIDE - Abnormal; Notable for the following components:   B Natriuretic Peptide 168.0 (*)    All other components within normal limits  TROPONIN I    RADIOLOGY Images were viewed by me  Chest x-ray IMPRESSION: No acute cardiopulmonary abnormality.  Emphysema (ICD10-J43.9). ____________________________________________  CRITICAL CARE Performed by: Laurence Aly   Total critical care time: 30 minutes  Critical  care time was exclusive of separately billable procedures and treating other patients.  Critical care was necessary to treat or prevent imminent or life-threatening deterioration.  Critical care was time spent personally by me on the following activities: development of treatment plan with patient and/or surrogate as well as nursing, discussions with consultants, evaluation of patient's response to treatment, examination of patient, obtaining history from patient or surrogate, ordering and performing treatments and interventions, ordering and review of laboratory studies, ordering and review of radiographic studies, pulse oximetry and re-evaluation of patient's condition.    DIFFERENTIAL DIAGNOSIS   SVT, unstable angina, MI, PE, dissection  FINAL ASSESSMENT AND PLAN  Atrial fibrillation with a rapid ventricular response, chest pain   Plan: The patient had presented for fast heartbeat and chest pain. Patient's labs were grossly unremarkable. Patient's imaging did not reveal any acute process.  He was given a DuoNeb as well as initial dose of Lopressor which did improve his heart rate some.  He subsequently had recurrent tachycardia requiring a Cardizem drip.  This appears to be atrial fibrillation with a rapid ventricular response.  We will continue on a Cardizem drip and admit to the hospital.   Molli Barrows E  Jimmye Norman, MD   Note: This note was generated in part or whole with voice recognition software. Voice recognition is usually quite accurate but there are transcription errors that can and very often do occur. I apologize for any typographical errors that were not detected and corrected.     Earleen Newport, MD 05/02/18 0623    Earleen Newport, MD 05/02/18 0930    Earleen Newport, MD 05/02/18 1026

## 2018-05-02 NOTE — Progress Notes (Signed)
ANTICOAGULATION CONSULT NOTE   Pharmacy Consult for Heparin Drip Management  Indication: chest pain/ACS  Allergies  Allergen Reactions  . Zantac [Ranitidine Hcl] Shortness Of Breath  . Codeine Itching  . Erythromycin Hives  . Vicodin [Hydrocodone-Acetaminophen] Rash    Patient Measurements: Height: 5\' 6"  (167.6 cm) Weight: 203 lb 11.2 oz (92.4 kg) IBW/kg (Calculated) : 63.8 Heparin Dosing Weight: 80.4kg   Vital Signs: Temp: 97.8 F (36.6 C) (05/16 1609) Temp Source: Oral (05/16 1609) BP: 117/69 (05/16 1609) Pulse Rate: 75 (05/16 1609)  Labs: Recent Labs    05/02/18 0827 05/02/18 1347 05/02/18 1828  HGB 16.2  --   --   HCT 48.4  --   --   PLT 222  --   --   APTT 32  --   --   LABPROT 12.7  --   --   INR 0.96  --   --   HEPARINUNFRC  --   --  0.16*  CREATININE 0.92  --   --   TROPONINI <0.03 0.05* 0.06*    Estimated Creatinine Clearance: 84 mL/min (by C-G formula based on SCr of 0.92 mg/dL).   Medications:  Scheduled:  . amitriptyline  15 mg Oral QHS  . [START ON 05/03/2018] aspirin  325 mg Oral Daily  . atorvastatin  80 mg Oral QHS  . busPIRone  10 mg Oral BID  . heparin  2,500 Units Intravenous Once  . ipratropium-albuterol      . isosorbide mononitrate  60 mg Oral Daily  . [START ON 05/03/2018] lisinopril  20 mg Oral Daily  . [START ON 05/03/2018] metoprolol tartrate  100 mg Oral BID  . mometasone-formoterol  2 puff Inhalation BID  . pantoprazole  40 mg Oral Daily   Infusions:  . heparin 950 Units/hr (05/02/18 1120)    Assessment: Pharmacy consulted for heparin drip management for 67 yo male admitted with atrial fibrillation initiated on heparin drip for possible ACS/STEMI.  Goal of Therapy:  Heparin level 0.3-0.7 units/ml Monitor platelets by anticoagulation protocol: Yes   Plan:  Will bolus heparin 2500 units and increase infusion to 1250 units/hr. Next HL in 6 hours.   Ulice Dash D 05/02/2018,7:26 PM

## 2018-05-02 NOTE — ED Triage Notes (Signed)
Pt states "fast heartrate" that started this am, happened one other time and he was able to take "a heart pill" to slow it down, has not worked this am. Left sided cp since he woke up radiating down left arm and to back. Shob. Home O2 1L as needed.

## 2018-05-02 NOTE — ED Notes (Signed)
Attempted to call report to floor 

## 2018-05-02 NOTE — Progress Notes (Signed)
ANTICOAGULATION CONSULT NOTE   Pharmacy Consult for Heparin Drip Management  Indication: chest pain/ACS  Allergies  Allergen Reactions  . Zantac [Ranitidine Hcl] Shortness Of Breath  . Codeine Itching  . Erythromycin Hives  . Vicodin [Hydrocodone-Acetaminophen] Rash    Patient Measurements: Height: 5\' 6"  (167.6 cm) Weight: 210 lb (95.3 kg) IBW/kg (Calculated) : 63.8 Heparin Dosing Weight: 80.4kg   Vital Signs: BP: 140/96 (05/16 1024) Pulse Rate: 91 (05/16 1024)  Labs: Recent Labs    05/02/18 0827  HGB 16.2  HCT 48.4  PLT 222  CREATININE 0.92  TROPONINI <0.03    Estimated Creatinine Clearance: 85.4 mL/min (by C-G formula based on SCr of 0.92 mg/dL).   Medications:  Scheduled:  . metoprolol tartrate      . heparin  4,000 Units Intravenous Once  . potassium chloride  60 mEq Oral Once   Infusions:  . heparin      Assessment: Pharmacy consulted for heparin drip management for 67 yo male admitted with atrial fibrillation initiated on heparin drip for possible ACS/STEMI. No anticoagulation noted on home medication list. APTT and PT/INR pending.   Goal of Therapy:  Heparin level 0.3-0.7 units/ml Monitor platelets by anticoagulation protocol: Yes   Plan:  Will order heparin bolus 4000 units x 1. Will initiate heparin drip at 950 units/hr. Will obtain Heparin level 6 hours after initiation of heparin drip.   Pharmacy will continue to monitor and adjust per consult.   Simpson,Michael L 05/02/2018,10:56 AM

## 2018-05-02 NOTE — Plan of Care (Signed)
  Problem: Education: Goal: Knowledge of General Education information will improve Outcome: Progressing   Problem: Health Behavior/Discharge Planning: Goal: Ability to manage health-related needs will improve Outcome: Progressing   Problem: Activity: Goal: Risk for activity intolerance will decrease Outcome: Progressing   Problem: Coping: Goal: Level of anxiety will decrease Outcome: Progressing   Problem: Pain Managment: Goal: General experience of comfort will improve Outcome: Progressing   Problem: Safety: Goal: Ability to remain free from injury will improve Outcome: Progressing   Problem: Skin Integrity: Goal: Risk for impaired skin integrity will decrease Outcome: Progressing   Problem: Education: Goal: Knowledge of disease or condition will improve Outcome: Progressing Goal: Knowledge of the prescribed therapeutic regimen will improve Outcome: Progressing   Problem: Activity: Goal: Ability to tolerate increased activity will improve Outcome: Progressing Goal: Will verbalize the importance of balancing activity with adequate rest periods Outcome: Progressing   Problem: Respiratory: Goal: Ability to maintain a clear airway will improve Outcome: Progressing Goal: Levels of oxygenation will improve Outcome: Progressing Goal: Ability to maintain adequate ventilation will improve Outcome: Progressing

## 2018-05-02 NOTE — Progress Notes (Signed)
Family Meeting Note  Advance Directive:yes  Today a meeting took place with the Patient and spouse.  Patient is able to participate  The following clinical team members were present during this meeting:MD  The following were discussed:Patient's diagnosis: , Patient's progosis: Unable to determine and Goals for treatment: Full Code  Additional follow-up to be provided: prn  Time spent during discussion:20 minutes  Gorden Harms, MD

## 2018-05-03 LAB — BASIC METABOLIC PANEL
ANION GAP: 13 (ref 5–15)
BUN: 12 mg/dL (ref 6–20)
CO2: 27 mmol/L (ref 22–32)
Calcium: 9.2 mg/dL (ref 8.9–10.3)
Chloride: 95 mmol/L — ABNORMAL LOW (ref 101–111)
Creatinine, Ser: 1.01 mg/dL (ref 0.61–1.24)
GFR calc Af Amer: >60 mL/min (ref >60)
GFR calc non Af Amer: >60 mL/min (ref >60)
Glucose, Bld: 228 mg/dL — ABNORMAL HIGH (ref 65–99)
POTASSIUM: 3.8 mmol/L (ref 3.5–5.1)
SODIUM: 135 mmol/L (ref 135–145)

## 2018-05-03 LAB — CBC
HEMATOCRIT: 44.7 % (ref 40.0–52.0)
Hemoglobin: 15 g/dL (ref 13.0–18.0)
MCH: 29.1 pg (ref 26.0–34.0)
MCHC: 33.7 g/dL (ref 32.0–36.0)
MCV: 86.6 fL (ref 80.0–100.0)
Platelets: 224 10*3/uL (ref 150–440)
RBC: 5.17 MIL/uL (ref 4.40–5.90)
RDW: 16.7 % — AB (ref 11.5–14.5)
WBC: 10.4 10*3/uL (ref 3.8–10.6)

## 2018-05-03 LAB — HEPARIN LEVEL (UNFRACTIONATED)
HEPARIN UNFRACTIONATED: 0.33 [IU]/mL (ref 0.30–0.70)
Heparin Unfractionated: 0.36 IU/mL (ref 0.30–0.70)

## 2018-05-03 MED ORDER — DIPHENHYDRAMINE HCL 25 MG PO CAPS
25.0000 mg | ORAL_CAPSULE | Freq: Three times a day (TID) | ORAL | Status: DC | PRN
  Filled 2018-05-03: qty 1

## 2018-05-03 MED ORDER — MELATONIN 5 MG PO TABS
5.0000 mg | ORAL_TABLET | Freq: Every evening | ORAL | Status: DC | PRN
  Administered 2018-05-03 (×2): 5 mg via ORAL
  Filled 2018-05-03 (×3): qty 1

## 2018-05-03 MED ORDER — APIXABAN 5 MG PO TABS
5.0000 mg | ORAL_TABLET | Freq: Two times a day (BID) | ORAL | Status: DC
  Administered 2018-05-03 – 2018-05-04 (×2): 5 mg via ORAL
  Filled 2018-05-03 (×2): qty 1

## 2018-05-03 MED ORDER — GABAPENTIN 600 MG PO TABS: 300.0000 mg | ORAL_TABLET | Freq: Three times a day (TID) | ORAL | Status: DC

## 2018-05-03 MED ORDER — DIPHENHYDRAMINE HCL 50 MG/ML IJ SOLN
25.0000 mg | Freq: Once | INTRAMUSCULAR | Status: AC
  Administered 2018-05-03: 25 mg via INTRAVENOUS
  Filled 2018-05-03: qty 1

## 2018-05-03 MED ORDER — HYDROCORTISONE 1 % EX CREA
TOPICAL_CREAM | Freq: Two times a day (BID) | CUTANEOUS | Status: DC
  Administered 2018-05-03: 04:00:00 via TOPICAL
  Filled 2018-05-03: qty 28

## 2018-05-03 NOTE — Consult Note (Signed)
Reason for Consult: Atrial fibrillation borderline troponin Referring Physician: [place] hospitalist [facility] primary  Darius Norris is an 67 y.o. male.  HPI: Patient acute onset of palpitations and chest discomfort radiating to the neck and across her chest started about 6 AM the morning of admission.  Patient came to the emergency room found to be in A. fib with rapid ventricular response but with a low potassium of 2.9.  Patient troponins were borderline EKG otherwise showed nonspecific ST changes.  Patient had echocardiogram in December which showed preserved left ventricular function with hypokinesis patient had rate control with Cardizem with vague chest pain symptoms denies any blackout spells or syncope admitted with chest pain and hypokalemia with rapid A. fib  Past Medical History:  Diagnosis Date  . CAD (coronary artery disease)   . Colitis   . COPD (chronic obstructive pulmonary disease) ([place])   . Emphysema lung ([place])   . GERD (gastroesophageal reflux disease)   . Hypertension   . MI (myocardial infarction) ([place]) 1999    Past Surgical History:  Procedure Laterality Date  . ABDOMINAL HERNIA REPAIR Bilateral   . CHOLECYSTECTOMY    . COLONOSCOPY WITH PROPOFOL N/A 02/01/2018   Procedure: COLONOSCOPY WITH PROPOFOL;  Surgeon: Jonathon Bellows, MD;  Location: [facility] ENDOSCOPY;  Service: Gastroenterology;  Laterality: N/A;  . KNEE ARTHROSCOPY Bilateral     Family History  Problem Relation Age of Onset  . CAD Father   . Stroke Father   . CAD Sister   . Stroke Brother     Social History:  reports that he has quit smoking. He has never used smokeless tobacco. He reports that he does not drink alcohol or use drugs.  Allergies:  Allergies  Allergen Reactions  . Zantac [Ranitidine Hcl] Shortness Of Breath  . Codeine Itching  . Erythromycin Hives  . Vicodin [Hydrocodone-Acetaminophen] Rash    Medications: I have reviewed the patient's current medications.  Results for  orders placed or performed during the hospital encounter of 05/02/18 (from the past 48 hour(s))  CBC     Status: Abnormal   Collection Time: 05/02/18  8:27 AM  Result Value Ref Range   WBC 9.5 3.8 - 10.6 K/uL   RBC 5.55 4.40 - 5.90 MIL/uL   Hemoglobin 16.2 13.0 - 18.0 g/dL   HCT 48.4 40.0 - 52.0 %   MCV 87.2 80.0 - 100.0 fL   MCH 29.3 26.0 - 34.0 pg   MCHC 33.5 32.0 - 36.0 g/dL   RDW 16.5 (H) 11.5 - 14.5 %   Platelets 222 150 - 440 K/uL    Comment: Performed at [facility], [address] [place], [place] [address]  Troponin I     Status: None   Collection Time: 05/02/18  8:27 AM  Result Value Ref Range   Troponin I <0.03 <0.03 ng/mL    Comment: Performed at [facility], [address] [place], [place] [address]  Comprehensive metabolic panel     Status: Abnormal   Collection Time: 05/02/18  8:27 AM  Result Value Ref Range   Sodium 139 135 - 145 mmol/L   Potassium 2.9 (L) 3.5 - 5.1 mmol/L   Chloride 98 (L) 101 - 111 mmol/L   CO2 31 22 - 32 mmol/L   Glucose, Bld 133 (H) 65 - 99 mg/dL   BUN 8 6 - 20 mg/dL   Creatinine, Ser 0.92 0.61 - 1.24 mg/dL   Calcium 9.2 8.9 - 10.3 mg/dL  Total Protein 7.6 6.5 - 8.1 g/dL   Albumin 4.1 3.5 - 5.0 g/dL   AST 20 15 - 41 U/L   ALT 18 17 - 63 U/L   Alkaline Phosphatase 124 38 - 126 U/L   Total Bilirubin 0.7 0.3 - 1.2 mg/dL   GFR calc non Af Amer >60 >60 mL/min   GFR calc Af Amer >60 >60 mL/min    Comment: (NOTE) The eGFR has been calculated using the CKD EPI equation. This calculation has not been validated in all clinical situations. eGFR's persistently <60 mL/min signify possible Chronic Kidney Disease.    Anion gap 10 5 - 15    Comment: Performed at [facility], [address] [place], [place] [address]  Brain natriuretic peptide     Status: Abnormal   Collection Time: 05/02/18  8:27 AM  Result Value Ref Range   B Natriuretic Peptide 168.0 (H) 0.0 - 100.0 pg/mL    Comment: Performed at [facility], [address] [place], [place] [address]  Protime-INR     Status: None   Collection Time: 05/02/18  8:27 AM  Result Value Ref Range   Prothrombin Time 12.7 11.4 - 15.2 seconds   INR 0.96     Comment: Performed at [facility], [address] [place], [place] [address]  APTT     Status: None   Collection Time: 05/02/18  8:27 AM  Result Value Ref Range   aPTT 32 24 - 36 seconds    Comment: Performed at [facility], [address] [place], [place] [address]  Blood gas, venous     Status: Abnormal (Preliminary result)   Collection Time: 05/02/18  8:41 AM  Result Value Ref Range   pH, Ven 7.39 7.250 - 7.430   pCO2, Ven 60 44.0 - 60.0 mmHg   pO2, Ven PENDING 32.0 - 45.0 mmHg   Bicarbonate 36.3 (H) 20.0 - 28.0 mmol/L   Acid-Base Excess 8.8 (H) 0.0 - 2.0 mmol/L   Patient temperature 37.0    Collection site VEIN    Sample type VEIN     Comment: Performed at [facility], [address], [place], [place] [address]  Troponin I-serum (0, 3, 6 hours)     Status: Abnormal   Collection Time: 05/02/18  1:47 PM  Result Value Ref Range   Troponin I 0.05 (HH) <0.03 ng/mL    Comment: CRITICAL RESULT CALLED TO, READ BACK BY AND VERIFIED WITH ABIGAIL JACKSON  05/02/18 1429 KLW Performed at [facility], [address] [place], [place] [address]   Magnesium     Status: None   Collection Time: 05/02/18  1:47 PM  Result Value Ref Range   Magnesium 1.8 1.7 - 2.4 mg/dL    Comment: Performed at [facility], [place], [place] [address]  Heparin level (unfractionated)     Status: Abnormal   Collection Time: 05/02/18  6:28 PM  Result Value Ref Range   Heparin Unfractionated 0.16 (L) 0.30 - 0.70 IU/mL    Comment: (NOTE) If heparin results are below expected values, and patient dosage has  been confirmed, suggest follow up testing of antithrombin III levels. Performed at [facility], [address]  [place], [place] [address]   Troponin I-serum (0, 3, 6 hours)     Status: Abnormal   Collection Time: 05/02/18  6:28 PM  Result Value Ref Range   Troponin I 0.06 (HH) <0.03 ng/mL  Comment: CRITICAL VALUE NOTED. VALUE IS CONSISTENT WITH PREVIOUSLY REPORTED/CALLED VALUE KLW Performed at [facility], [address] [place], [place] [address]   Troponin I-serum (0, 3, 6 hours)     Status: Abnormal   Collection Time: 05/02/18 11:04 PM  Result Value Ref Range   Troponin I 0.06 (HH) <0.03 ng/mL    Comment: CRITICAL RESULT CALLED TO, READ BACK BY AND VERIFIED WITH BRITNEY Meeker ON 05/02/18 AT 2354 JAG Performed at [facility], [address] [place], [place] [address]   Basic metabolic panel     Status: Abnormal   Collection Time: 05/02/18 11:04 PM  Result Value Ref Range   Sodium 135 135 - 145 mmol/L   Potassium 3.8 3.5 - 5.1 mmol/L   Chloride 95 (L) 101 - 111 mmol/L   CO2 27 22 - 32 mmol/L   Glucose, Bld 228 (H) 65 - 99 mg/dL   BUN 12 6 - 20 mg/dL   Creatinine, Ser 1.01 0.61 - 1.24 mg/dL   Calcium 9.2 8.9 - 10.3 mg/dL   GFR calc non Af Amer >60 >60 mL/min   GFR calc Af Amer >60 >60 mL/min    Comment: (NOTE) The eGFR has been calculated using the CKD EPI equation. This calculation has not been validated in all clinical situations. eGFR's persistently <60 mL/min signify possible Chronic Kidney Disease.    Anion gap 13 5 - 15    Comment: Performed at [facility], [address] [place], [place] [address]  CBC     Status: Abnormal   Collection Time: 05/02/18 11:04 PM  Result Value Ref Range   WBC 8.9 3.8 - 10.6 K/uL   RBC 5.22 4.40 - 5.90 MIL/uL   Hemoglobin 15.1 13.0 - 18.0 g/dL   HCT 45.5 40.0 - 52.0 %   MCV 87.0 80.0 - 100.0 fL   MCH 28.9 26.0 - 34.0 pg   MCHC 33.2 32.0 - 36.0 g/dL   RDW 16.9 (H) 11.5 - 14.5 %   Platelets 241 150 - 440 K/uL    Comment: Performed at [facility], [address] [place], [place] [address]   Heparin level (unfractionated)     Status: None   Collection Time: 05/02/18 11:04 PM  Result Value Ref Range   Heparin Unfractionated 0.36 0.30 - 0.70 IU/mL    Comment: (NOTE) If heparin results are below expected values, and patient dosage has  been confirmed, suggest follow up testing of antithrombin III levels. Performed at [facility], [place], [place] [address]   Heparin level (unfractionated)     Status: None   Collection Time: 05/03/18  6:05 AM  Result Value Ref Range   Heparin Unfractionated 0.33 0.30 - 0.70 IU/mL    Comment: (NOTE) If heparin results are below expected values, and patient dosage has  been confirmed, suggest follow up testing of antithrombin III levels. Performed at [facility], [address] [place], [place] [address]   CBC     Status: Abnormal   Collection Time: 05/03/18  6:05 AM  Result Value Ref Range   WBC 10.4 3.8 - 10.6 K/uL   RBC 5.17 4.40 - 5.90 MIL/uL   Hemoglobin 15.0 13.0 - 18.0 g/dL   HCT 44.7 40.0 - 52.0 %   MCV 86.6 80.0 - 100.0 fL   MCH 29.1 26.0 - 34.0 pg   MCHC 33.7 32.0 - 36.0 g/dL   RDW 16.7 (H) 11.5 - 14.5 %  Platelets 224 150 - 440 K/uL    Comment: Performed at [facility], [address] [place], [place] [address]    Dg Chest Port 1 View  Result Date: 05/02/2018 CLINICAL DATA:  Tachycardia. EXAM: PORTABLE CHEST 1 VIEW COMPARISON:  Radiographs of November 17, 2017. FINDINGS: Stable cardiomediastinal silhouette. No pneumothorax or pleural effusion is noted. Emphysematous disease is noted in the upper lobes bilaterally. Stable bibasilar scarring is noted. No acute pulmonary disease is noted. Bony thorax is unremarkable. IMPRESSION: No acute cardiopulmonary abnormality. Emphysema (ICD10-J43.9). Electronically Signed   By: Marijo Conception, M.D.   On: 05/02/2018 09:04    Review of Systems  Constitutional: Positive for malaise/fatigue.  HENT: Negative.   Eyes: Negative.    Respiratory: Positive for shortness of breath.   Cardiovascular: Positive for chest pain and palpitations.  Gastrointestinal: Negative.   Genitourinary: Negative.   Musculoskeletal: Negative.   Skin: Negative.   Endo/Heme/Allergies: Negative.   Psychiatric/Behavioral: Negative.    Blood pressure 122/72, pulse 68, temperature 97.8 F (36.6 C), temperature source Oral, resp. rate 18, height 5' 6"  (1.676 m), weight 203 lb 11.2 oz (92.4 kg), SpO2 98 %. Physical Exam  Nursing note and vitals reviewed. Constitutional: He is oriented to person, place, and time. He appears well-developed and well-nourished.  HENT:  Head: Normocephalic and atraumatic.  Eyes: Pupils are equal, round, and reactive to light. Conjunctivae and EOM are normal.  Neck: Neck supple.  Cardiovascular: Normal rate and normal pulses. An irregularly irregular rhythm present. Exam reveals distant heart sounds.  Murmur heard.  Systolic murmur is present with a grade of 2/6. Respiratory: Effort normal and breath sounds normal.  GI: Soft. Bowel sounds are normal.  Musculoskeletal: Normal range of motion.  Neurological: He is alert and oriented to person, place, and time. He has normal reflexes.  Skin: Skin is warm and dry.  Psychiatric: He has a normal mood and affect.    Assessment/Plan: Atrial fibrillation Coronary disease Emphysema Hypertension GERD . Agree with telemetry EKG troponins Recommend long-term anticoagulation for A. Fib Continue inhalers as possible for COPD Continue GERD therapy as necessary Agree with hypertension control and management Correct hypokalemia to above 3.5 Inhalers as necessary for COPD shortness of breath symptoms Do not recommend invasive strategy for possible coronary disease   D  05/03/2018, 5:05 PM

## 2018-05-03 NOTE — Plan of Care (Signed)
  Problem: Coping: Goal: Level of anxiety will decrease Outcome: Progressing   Problem: Pain Managment: Goal: General experience of comfort will improve Outcome: Progressing   Problem: Safety: Goal: Ability to remain free from injury will improve Outcome: Progressing   

## 2018-05-03 NOTE — Progress Notes (Signed)
ANTICOAGULATION CONSULT NOTE   Pharmacy Consult for Heparin Drip Management  Indication: chest pain/ACS  Allergies  Allergen Reactions  . Zantac [Ranitidine Hcl] Shortness Of Breath  . Codeine Itching  . Erythromycin Hives  . Vicodin [Hydrocodone-Acetaminophen] Rash    Patient Measurements: Height: 5\' 6"  (167.6 cm) Weight: 203 lb 11.2 oz (92.4 kg) IBW/kg (Calculated) : 63.8 Heparin Dosing Weight: 80.4kg   Vital Signs: Temp: 98.4 F (36.9 C) (05/17 0357) Temp Source: Oral (05/17 0357) BP: 155/75 (05/17 0357) Pulse Rate: 68 (05/17 0357)  Labs: Recent Labs    05/02/18 0827 05/02/18 1347 05/02/18 1828 05/02/18 2304 05/03/18 0605  HGB 16.2  --   --  15.1 15.0  HCT 48.4  --   --  45.5 44.7  PLT 222  --   --  241 224  APTT 32  --   --   --   --   LABPROT 12.7  --   --   --   --   INR 0.96  --   --   --   --   HEPARINUNFRC  --   --  0.16* 0.36 0.33  CREATININE 0.92  --   --  1.01  --   TROPONINI <0.03 0.05* 0.06* 0.06*  --     Estimated Creatinine Clearance: 76.5 mL/min (by C-G formula based on SCr of 1.01 mg/dL).   Medications:  Scheduled:  . amitriptyline  15 mg Oral QHS  . aspirin  325 mg Oral Daily  . atorvastatin  80 mg Oral QHS  . busPIRone  10 mg Oral BID  . hydrocortisone cream   Topical BID  . isosorbide mononitrate  60 mg Oral Daily  . lisinopril  20 mg Oral Daily  . metoprolol tartrate  100 mg Oral BID  . mometasone-formoterol  2 puff Inhalation BID  . pantoprazole  40 mg Oral Daily   Infusions:  . heparin 1,250 Units/hr (05/03/18 4401)    Assessment: Pharmacy consulted for heparin drip management for 68 yo male admitted with atrial fibrillation initiated on heparin drip for possible ACS/STEMI.  Goal of Therapy:  Heparin level 0.3-0.7 units/ml Monitor platelets by anticoagulation protocol: Yes   Plan:  Will bolus heparin 2500 units and increase infusion to 1250 units/hr. Next HL in 6 hours.   5/16 2300 heparin level 0.36. Recheck with AM  labs to confirm.  05/17 AM heparin level 0.33. Continue current regimen. Recheck heparin level and CBC with tomorrow AM labs.  Jeylin Woodmansee S 05/03/2018,7:01 AM

## 2018-05-03 NOTE — Progress Notes (Signed)
Pt c/o itching and jitteriness.  Thinks nighttime meds are making his psoriasis act up. Pharmacy doesn't stock his psoriasis cream, clobetasol.  Ordered hydrocortisone cream. Paged on-call MD about benadryl.

## 2018-05-03 NOTE — Progress Notes (Addendum)
Pt is requesting benadryl PRN. Page prime. Will continue to monitor.  Update 2131. Docotr Order benadryl every 8 hours for itching. Will continue to monitor.  Update 0656 pt complaints of nausea. Zofran was administered. Will continue to monitor.

## 2018-05-03 NOTE — Progress Notes (Signed)
[place] [facility] [place] NAME: Daire Okimoto    MR#:  [id number]  DATE OF BIRTH:  09-May-1951  SUBJECTIVE:    REVIEW OF SYSTEMS:   ROS Tolerating Diet: Tolerating PT:   DRUG ALLERGIES:   Allergies  Allergen Reactions  . Zantac [Ranitidine Hcl] Shortness Of Breath  . Codeine Itching  . Erythromycin Hives  . Vicodin [Hydrocodone-Acetaminophen] Rash    VITALS:  Blood pressure (!) 153/84, pulse 75, temperature (!) 97.5 F (36.4 C), temperature source Oral, resp. rate 19, height 5\' 6"  (1.676 m), weight 92.4 kg (203 lb 11.2 oz), SpO2 97 %.  PHYSICAL EXAMINATION:   Physical Exam  GENERAL:  67 y.o.-year-old patient lying in the bed with no acute distress.  EYES: Pupils equal, round, reactive to light and accommodation. No scleral icterus. Extraocular muscles intact.  HEENT: Head atraumatic, normocephalic. Oropharynx and nasopharynx clear.  NECK:  Supple, no jugular venous distention. No thyroid enlargement, no tenderness.  LUNGS: Normal breath sounds bilaterally, no wheezing, rales, rhonchi. No use of accessory muscles of respiration.  CARDIOVASCULAR: S1, S2 normal. No murmurs, rubs, or gallops.  ABDOMEN: Soft, nontender, nondistended. Bowel sounds present. No organomegaly or mass.  EXTREMITIES: No cyanosis, clubbing or edema b/l.    NEUROLOGIC: Cranial nerves II through XII are intact. No focal Motor or sensory deficits b/l.   PSYCHIATRIC:  patient is alert and oriented x 3.  SKIN: No obvious rash, lesion, or ulcer.   LABORATORY PANEL:  CBC Recent Labs  Lab 05/03/18 0605  WBC 10.4  HGB 15.0  HCT 44.7  PLT 224    Chemistries  Recent Labs  Lab 05/02/18 0827 05/02/18 1347 05/02/18 2304  NA 139  --  135  K 2.9*  --  3.8  CL 98*  --  95*  CO2 31  --  27  GLUCOSE 133*  --  228*  BUN 8  --  12  CREATININE 0.92  --  1.01  CALCIUM 9.2  --  9.2  MG  --  1.8  --   AST 20  --   --   ALT 18  --   --   ALKPHOS 124  --    --   BILITOT 0.7  --   --    Cardiac Enzymes Recent Labs  Lab 05/02/18 2304  TROPONINI 0.06*   RADIOLOGY:  Dg Chest Port 1 View  Result Date: 05/02/2018 CLINICAL DATA:  Tachycardia. EXAM: PORTABLE CHEST 1 VIEW COMPARISON:  Radiographs of November 17, 2017. FINDINGS: Stable cardiomediastinal silhouette. No pneumothorax or pleural effusion is noted. Emphysematous disease is noted in the upper lobes bilaterally. Stable bibasilar scarring is noted. No acute pulmonary disease is noted. Bony thorax is unremarkable. IMPRESSION: No acute cardiopulmonary abnormality. Emphysema (ICD10-J43.9). Electronically Signed   By: Marijo Conception, M.D.   On: 05/02/2018 09:04   ASSESSMENT AND PLAN:   Gleen Ripberger  is a 67 y.o. male with a known history per below presents emergency room with acute onset of heart palpitations with chest discomfort radiating into the back and across his chest that started around 5:55 AM this morning, in the emergency room patient was found to have A. fib with RVR on EKG, potassium was 2.9, troponin negative  *Acute A. fib with RVR with chest pain--new onset Exacerbated by hypokalemia -increase beta-blocker therapy, Cardizem drip with weaned off -pt is NSR - heparin drip as chads vas2 score 2 - replete hypokalemia -defer to cardiology  for long-term oral anticoagulation  -most recent echocardiogram in December 2018 noted for ejection fraction 50-55% with multiple areas of hypokinesis  *Acute chest pain with coronary artery disease/history of MI Most likely secondary to above Continue aspirin, statin therapy-check lipids in the morning, imdur, nitrates as needed, lisinopril, on heparin drip, supplemental oxygen, morphine as needed breakthrough pain  *Acute hypokalemia Replete with p.o. potassium, check magnesium level  *Acute on chronic hypoxic respiratory failure Stable On 2 L via nasal cannula as needed at home with activity  *Chronic GERD without esophagitis PPI  daily  *COPD without exacerbation Stable Continue home regiment, breathing treatments as needed  Discuss with patient and wife   Case discussed with Care Management/Social Worker. Management plans discussed with the patient, family and they are in agreement.  CODE STATUS: *full  DVT Prophylaxis: heparin gtt  TOTAL TIME TAKING CARE OF THIS PATIENT: 30 minutes.  >50% time spent on counselling and coordination of care  POSSIBLE D/C IN *1-2* DAYS, DEPENDING ON CLINICAL CONDITION.  Note: This dictation was prepared with Dragon dictation along with smaller phrase technology. Any transcriptional errors that result from this process are unintentional.  Fritzi Mandes M.D on 05/03/2018 at 2:36 PM  Between 7am to 6pm - Pager - [phone]  After 6pm go to [email or web address] - password [email or web address] [facility]  Office  [phone]  CC: Primary care physician; [facility] Medical[facility] ID: [name], male   DOB: [birth year], 68 y.o.   MRN: [id number]

## 2018-05-03 NOTE — Progress Notes (Signed)
ANTICOAGULATION CONSULT NOTE   Pharmacy Consult for Heparin Drip Management  Indication: chest pain/ACS  Allergies  Allergen Reactions  . Zantac [Ranitidine Hcl] Shortness Of Breath  . Codeine Itching  . Erythromycin Hives  . Vicodin [Hydrocodone-Acetaminophen] Rash    Patient Measurements: Height: 5\' 6"  (167.6 cm) Weight: 203 lb 11.2 oz (92.4 kg) IBW/kg (Calculated) : 63.8 Heparin Dosing Weight: 80.4kg   Vital Signs: Temp: 97.6 F (36.4 C) (05/16 1945) Temp Source: Oral (05/16 1945) BP: 142/72 (05/16 1945) Pulse Rate: 76 (05/16 1945)  Labs: Recent Labs    05/02/18 0827 05/02/18 1347 05/02/18 1828 05/02/18 2304  HGB 16.2  --   --  15.1  HCT 48.4  --   --  45.5  PLT 222  --   --  241  APTT 32  --   --   --   LABPROT 12.7  --   --   --   INR 0.96  --   --   --   HEPARINUNFRC  --   --  0.16* 0.36  CREATININE 0.92  --   --  1.01  TROPONINI <0.03 0.05* 0.06* 0.06*    Estimated Creatinine Clearance: 76.5 mL/min (by C-G formula based on SCr of 1.01 mg/dL).   Medications:  Scheduled:  . amitriptyline  15 mg Oral QHS  . aspirin  325 mg Oral Daily  . atorvastatin  80 mg Oral QHS  . busPIRone  10 mg Oral BID  . isosorbide mononitrate  60 mg Oral Daily  . lisinopril  20 mg Oral Daily  . metoprolol tartrate  100 mg Oral BID  . mometasone-formoterol  2 puff Inhalation BID  . pantoprazole  40 mg Oral Daily   Infusions:  . heparin 1,250 Units/hr (05/02/18 1949)    Assessment: Pharmacy consulted for heparin drip management for 67 yo male admitted with atrial fibrillation initiated on heparin drip for possible ACS/STEMI.  Goal of Therapy:  Heparin level 0.3-0.7 units/ml Monitor platelets by anticoagulation protocol: Yes   Plan:  Will bolus heparin 2500 units and increase infusion to 1250 units/hr. Next HL in 6 hours.   5/16 2300 heparin level 0.36. Recheck with AM labs to confirm.  Willmer Fellers S 05/03/2018,3:47 AM

## 2018-05-03 NOTE — Plan of Care (Signed)
  Problem: Education: Goal: Knowledge of General Education information will improve Outcome: Progressing   Problem: Pain Managment: Goal: General experience of comfort will improve Outcome: Progressing   Problem: Respiratory: Goal: Ability to maintain adequate ventilation will improve Outcome: Progressing

## 2018-05-04 LAB — CBC
HCT: 44 % (ref 40.0–52.0)
Hemoglobin: 14.5 g/dL (ref 13.0–18.0)
MCH: 29 pg (ref 26.0–34.0)
MCHC: 33 g/dL (ref 32.0–36.0)
MCV: 87.9 fL (ref 80.0–100.0)
Platelets: 186 10*3/uL (ref 150–440)
RBC: 5.01 MIL/uL (ref 4.40–5.90)
RDW: 17 % — ABNORMAL HIGH (ref 11.5–14.5)
WBC: 7.2 10*3/uL (ref 3.8–10.6)

## 2018-05-04 MED ORDER — METOPROLOL TARTRATE 100 MG PO TABS: 100.0000 mg | ORAL_TABLET | Freq: Two times a day (BID) | ORAL | 0 refills | Status: DC

## 2018-05-04 MED ORDER — APIXABAN 5 MG PO TABS: 5.0000 mg | ORAL_TABLET | Freq: Two times a day (BID) | ORAL | 2 refills | Status: DC

## 2018-05-04 NOTE — Care Management (Signed)
[employer]  O2 PRN. Home with wife. Gets medications from [place]. RN states he is on Eliquis- voucher provided. He uses scooter to get around.  He denies RNCM needs.

## 2018-05-04 NOTE — Plan of Care (Signed)
  Problem: Activity: Goal: Risk for activity intolerance will decrease Outcome: Adequate for Discharge   Problem: Coping: Goal: Level of anxiety will decrease Outcome: Adequate for Discharge   Problem: Pain Managment: Goal: General experience of comfort will improve Outcome: Adequate for Discharge   Problem: Safety: Goal: Ability to remain free from injury will improve Outcome: Adequate for Discharge

## 2018-05-04 NOTE — Discharge Summary (Signed)
[facility] [place] NAME: Darius Norris    MR#:  [id number]  DATE OF BIRTH:  [birth year]  DATE OF ADMISSION:  05/02/2018 ADMITTING PHYSICIAN: Gorden Harms, MD  DATE OF DISCHARGE: 05/04/2018  PRIMARY CARE PHYSICIAN: [facility]    ADMISSION DIAGNOSIS:  Atrial fibrillation with rapid ventricular response ([place]) [I48.91] Nonspecific chest pain [R07.9]  DISCHARGE DIAGNOSIS:  Atrial fibrillation with RVR new onset  SECONDARY DIAGNOSIS:   Past Medical History:  Diagnosis Date  . CAD (coronary artery disease)   . Colitis   . COPD (chronic obstructive pulmonary disease) ([place])   . Emphysema lung ([place])   . GERD (gastroesophageal reflux disease)   . Hypertension   . MI (myocardial infarction) [facility]) [place] COURSE:   JamesRitchieis a67 y.o.malewith a known historyper below presents emergency room with acute onset of heart palpitations with chest discomfort radiating into the back and across his chest that started around 5:55 AM this morning, in the emergency room patient was found to have A. fib with RVR on EKG, potassium was 2.9, troponin negative  *Acute A. fib with RVR with chest pain--new onset Exacerbated by hypokalemia -increased beta-blocker therapy, Cardizem drip with weaned off -pt is NSR with intermittent irregualr rhythm - heparin drip as chads vas2score changed to po eliquis per cardiology recommendations - repleted K -most recent echocardiogram in December 2018 noted for ejection fraction 50-55% with multiple areas of hypokinesis  *Acute chest pain with coronary artery disease/history of MI Most likely secondary to above Continue aspirin, statin therapy-check lipids in the morning,imdur,nitrates as needed, lisinopril, recieved heparin drip, supplemental oxygen, morphine as needed breakthrough pain -pt CP free  *Acute hypokalemia Repleted with p.o. potassium, magnesium  1.8  *Acute on chronic hypoxic respiratory failure Stable On 2 L via nasal cannula as needed at home with activity  *Chronic GERD without esophagitis PPI daily  *COPD without exacerbation Stable Continue home regiment, breathing treatments as needed  Overall feels better. Hemodynamically stable D/c home with out pt f/u cardiology Dr [name]   CONSULTS OBTAINED:  Treatment Team:  Yolonda Kida, MD  DRUG ALLERGIES:   Allergies  Allergen Reactions  . Zantac [Ranitidine Hcl] Shortness Of Breath  . Codeine Itching  . Erythromycin Hives  . Vicodin [Hydrocodone-Acetaminophen] Rash    DISCHARGE MEDICATIONS:   Allergies as of 05/04/2018      Reactions   Zantac [ranitidine Hcl] Shortness Of Breath   Codeine Itching   Erythromycin Hives   Vicodin [hydrocodone-acetaminophen] Rash      Medication List    STOP taking these medications   aspirin 325 MG tablet     TAKE these medications   acetaminophen 325 MG tablet Commonly known as:  TYLENOL Take 650 mg by mouth every 6 (six) hours as needed.   amitriptyline 10 MG tablet Commonly known as:  ELAVIL Take 15 mg by mouth at bedtime.   apixaban 5 MG Tabs tablet Commonly known as:  ELIQUIS Take 1 tablet (5 mg total) by mouth 2 (two) times daily.   atorvastatin 80 MG tablet Commonly known as:  LIPITOR Take 1 tablet (80 mg total) by mouth at bedtime.   budesonide-formoterol 160-4.5 MCG/ACT inhaler Commonly known as:  SYMBICORT Inhale 2 puffs into the lungs 2 (two) times daily.   busPIRone 10 MG tablet Commonly known as:  BUSPAR Take 10 mg by mouth 2 (two) times daily.   clobetasol cream 0.05 % Commonly known  as:  TEMOVATE Apply 1 application topically daily as needed. For itching on psoriasis areas   COMBIVENT RESPIMAT 20-100 MCG/ACT Aers respimat Generic drug:  Ipratropium-Albuterol Inhale 1 puff into the lungs every 6 (six) hours as needed for wheezing or shortness of breath.   diclofenac sodium 1  % Gel Commonly known as:  VOLTAREN Apply 2 g topically 2 (two) times daily.   fluticasone 50 MCG/ACT nasal spray Commonly known as:  FLONASE Place 2 sprays into both nostrils daily.   isosorbide mononitrate 60 MG 24 hr tablet Commonly known as:  IMDUR Take 60 mg by mouth daily.   lactulose 10 GM/15ML solution Commonly known as:  CHRONULAC Take 30 mLs (20 g total) by mouth daily as needed for mild constipation.   lisinopril 40 MG tablet Commonly known as:  PRINIVIL,ZESTRIL Take 20 mg by mouth daily.   loratadine 10 MG tablet Commonly known as:  CLARITIN Take 10 mg by mouth daily as needed for allergies.   metoprolol tartrate 100 MG tablet Commonly known as:  LOPRESSOR Take 1 tablet (100 mg total) by mouth 2 (two) times daily. What changed:  how much to take   omeprazole 20 MG capsule Commonly known as:  PRILOSEC Take 20 mg by mouth daily.       If you experience worsening of your admission symptoms, develop shortness of breath, life threatening emergency, suicidal or homicidal thoughts you must seek medical attention immediately by calling 911 or calling your MD immediately  if symptoms less severe.  You Must read complete instructions/literature along with all the possible adverse reactions/side effects for all the Medicines you take and that have been prescribed to you. Take any new Medicines after you have completely understood and accept all the possible adverse reactions/side effects.   Please note  You were cared for by a hospitalist during your hospital stay. If you have any questions about your discharge medications or the care you received while you were in the hospital after you are discharged, you can call the unit and asked to speak with the hospitalist on call if the hospitalist that took care of you is not available. Once you are discharged, your primary care physician will handle any further medical issues. Please note that NO REFILLS for any discharge  medications will be authorized once you are discharged, as it is imperative that you return to your primary care physician (or establish a relationship with a primary care physician if you do not have one) for your aftercare needs so that they can reassess your need for medications and monitor your lab values. Today   SUBJECTIVE   Feels better  VITAL SIGNS:  Blood pressure 134/68, pulse 78, temperature 97.7 F (36.5 C), temperature source Oral, resp. rate 18, height 5\' 6"  (1.676 m), weight 92.4 kg (203 lb 11.2 oz), SpO2 96 %.  I/O:    Intake/Output Summary (Last 24 hours) at 05/04/2018 0851 Last data filed at 05/04/2018 0716 Gross per 24 hour  Intake 725 ml  Output 3000 ml  Net -2275 ml    PHYSICAL EXAMINATION:  GENERAL:  67 y.o.-year-old patient lying in the bed with no acute distress.  EYES: Pupils equal, round, reactive to light and accommodation. No scleral icterus. Extraocular muscles intact.  HEENT: Head atraumatic, normocephalic. Oropharynx and nasopharynx clear.  NECK:  Supple, no jugular venous distention. No thyroid enlargement, no tenderness.  LUNGS: Normal breath sounds bilaterally, no wheezing, rales,rhonchi or crepitation. No use of accessory muscles of respiration.  CARDIOVASCULAR:  S1, S2 normal. No murmurs, rubs, or gallops.  ABDOMEN: Soft, non-tender, non-distended. Bowel sounds present. No organomegaly or mass.  EXTREMITIES: No pedal edema, cyanosis, or clubbing.  NEUROLOGIC: Cranial nerves II through XII are intact. Muscle strength 5/5 in all extremities. Sensation intact. Gait not checked.  PSYCHIATRIC: The patient is alert and oriented x 3.  SKIN: No obvious rash, lesion, or ulcer.   DATA REVIEW:   CBC  Recent Labs  Lab 05/04/18 0425  WBC 7.2  HGB 14.5  HCT 44.0  PLT 186    Chemistries  Recent Labs  Lab 05/02/18 0827 05/02/18 1347 05/02/18 2304  NA 139  --  135  K 2.9*  --  3.8  CL 98*  --  95*  CO2 31  --  27  GLUCOSE 133*  --  228*  BUN 8   --  12  CREATININE 0.92  --  1.01  CALCIUM 9.2  --  9.2  MG  --  1.8  --   AST 20  --   --   ALT 18  --   --   ALKPHOS 124  --   --   BILITOT 0.7  --   --     Microbiology Results   No results found for this or any previous visit (from the past 240 hour(s)).  RADIOLOGY:  No results found.   Management plans discussed with the patient, family and they are in agreement.  CODE STATUS:     Code Status Orders  (From admission, onward)        Start     Ordered   05/02/18 1239  Full code  Continuous     05/02/18 1238    Code Status History    Date Active Date Inactive Code Status Order ID Comments User Context   11/17/2017 0837 11/18/2017 1635 Full Code [id number]  Harrie Foreman, MD Inpatient   09/04/2016 1959 09/05/2016 1736 Full Code [id number]  Theodoro Grist, MD Inpatient   12/15/2015 0158 12/15/2015 1922 Full Code [id number]  Lance Coon, MD Inpatient      TOTAL TIME TAKING CARE OF THIS PATIENT: *40* minutes.    Fritzi Mandes M.D on 05/04/2018 at 8:51 AM  Between 7am to 6pm - Pager - [phone] After 6pm go to [email or web address] - password [email or web address] [facility]  Office  [phone]  CC: Primary care physician; [facility] [place]

## 2018-05-04 NOTE — Progress Notes (Signed)
Pt discharged this afternoon home with wife, pt agrees with follow up plan, and has been provided prescription information and education, as well as eliquis coupon, IV's removed, VSS no complications/complaints at this time.

## 2018-05-07 ENCOUNTER — Telehealth: Payer: Self-pay

## 2018-05-07 NOTE — Telephone Encounter (Signed)
Flagged on EMMI report for having unfilled prescriptions and having other questions/problems.  First attempt to reach patient made 05/07/18 at 1:55pm, however unable to reach patient.  Left voicemail encouraging callback. Will attempt at later time.

## 2018-05-08 NOTE — Telephone Encounter (Signed)
Second attempt to reach patient made 05/08/18 at 12pm, however unable to reach.  Left another voicemail encouraging callback if patient had any questions or issues concerning their discharge.

## 2018-05-10 LAB — BLOOD GAS, VENOUS
ACID-BASE EXCESS: 8.8 mmol/L — AB (ref 0.0–2.0)
Bicarbonate: 36.3 mmol/L — ABNORMAL HIGH (ref 20.0–28.0)
Patient temperature: 37
pCO2, Ven: 60 mmHg (ref 44.0–60.0)
pH, Ven: 7.39 (ref 7.250–7.430)

## 2018-05-28 ENCOUNTER — Encounter: Payer: Self-pay | Admitting: Emergency Medicine

## 2018-05-28 ENCOUNTER — Emergency Department: Payer: Medicare HMO

## 2018-05-28 ENCOUNTER — Other Ambulatory Visit: Payer: Self-pay

## 2018-05-28 ENCOUNTER — Observation Stay
Admission: EM | Admit: 2018-05-28 | Discharge: 2018-05-29 | Disposition: A | Discharge status: Home / Self Care | Payer: Medicare HMO | Attending: Family Medicine | Admitting: Family Medicine

## 2018-05-28 DIAGNOSIS — Z9981 Dependence on supplemental oxygen: Secondary | ICD-10-CM | POA: Insufficient documentation

## 2018-05-28 DIAGNOSIS — E876 Hypokalemia: Secondary | ICD-10-CM | POA: Diagnosis not present

## 2018-05-28 DIAGNOSIS — R079 Chest pain, unspecified: Secondary | ICD-10-CM

## 2018-05-28 DIAGNOSIS — J449 Chronic obstructive pulmonary disease, unspecified: Secondary | ICD-10-CM

## 2018-05-28 DIAGNOSIS — Z7901 Long term (current) use of anticoagulants: Secondary | ICD-10-CM | POA: Insufficient documentation

## 2018-05-28 DIAGNOSIS — J9621 Acute and chronic respiratory failure with hypoxia: Secondary | ICD-10-CM | POA: Diagnosis present

## 2018-05-28 DIAGNOSIS — Z885 Allergy status to narcotic agent status: Secondary | ICD-10-CM | POA: Diagnosis not present

## 2018-05-28 DIAGNOSIS — Z881 Allergy status to other antibiotic agents status: Secondary | ICD-10-CM | POA: Insufficient documentation

## 2018-05-28 DIAGNOSIS — J9601 Acute respiratory failure with hypoxia: Secondary | ICD-10-CM | POA: Diagnosis present

## 2018-05-28 DIAGNOSIS — I2510 Atherosclerotic heart disease of native coronary artery without angina pectoris: Secondary | ICD-10-CM | POA: Insufficient documentation

## 2018-05-28 DIAGNOSIS — J9611 Chronic respiratory failure with hypoxia: Secondary | ICD-10-CM | POA: Diagnosis present

## 2018-05-28 DIAGNOSIS — I200 Unstable angina: Secondary | ICD-10-CM | POA: Diagnosis present

## 2018-05-28 DIAGNOSIS — Z79899 Other long term (current) drug therapy: Secondary | ICD-10-CM | POA: Diagnosis not present

## 2018-05-28 DIAGNOSIS — I252 Old myocardial infarction: Secondary | ICD-10-CM | POA: Diagnosis not present

## 2018-05-28 DIAGNOSIS — I10 Essential (primary) hypertension: Secondary | ICD-10-CM | POA: Insufficient documentation

## 2018-05-28 DIAGNOSIS — J441 Chronic obstructive pulmonary disease with (acute) exacerbation: Principal | ICD-10-CM | POA: Insufficient documentation

## 2018-05-28 DIAGNOSIS — K219 Gastro-esophageal reflux disease without esophagitis: Secondary | ICD-10-CM | POA: Diagnosis not present

## 2018-05-28 LAB — CBC
HEMATOCRIT: 45.3 % (ref 40.0–52.0)
Hemoglobin: 15.2 g/dL (ref 13.0–18.0)
MCH: 29 pg (ref 26.0–34.0)
MCHC: 33.5 g/dL (ref 32.0–36.0)
MCV: 86.7 fL (ref 80.0–100.0)
PLATELETS: 249 10*3/uL (ref 150–440)
RBC: 5.23 MIL/uL (ref 4.40–5.90)
RDW: 16.8 % — ABNORMAL HIGH (ref 11.5–14.5)
WBC: 10.1 10*3/uL (ref 3.8–10.6)

## 2018-05-28 LAB — BASIC METABOLIC PANEL
Anion gap: 9 (ref 5–15)
BUN: 10 mg/dL (ref 6–20)
CHLORIDE: 102 mmol/L (ref 101–111)
CO2: 27 mmol/L (ref 22–32)
CREATININE: 1.04 mg/dL (ref 0.61–1.24)
Calcium: 9.1 mg/dL (ref 8.9–10.3)
GFR calc non Af Amer: >60 mL/min (ref >60)
Glucose, Bld: 123 mg/dL — ABNORMAL HIGH (ref 65–99)
POTASSIUM: 3.2 mmol/L — AB (ref 3.5–5.1)
SODIUM: 138 mmol/L (ref 135–145)

## 2018-05-28 LAB — TROPONIN I
Troponin I: <0.03 ng/mL (ref <0.03)
Troponin I: <0.03 ng/mL
Troponin I: <0.03 ng/mL

## 2018-05-28 LAB — BRAIN NATRIURETIC PEPTIDE: B Natriuretic Peptide: 125 pg/mL — ABNORMAL HIGH (ref 0.0–100.0)

## 2018-05-28 MED ORDER — ALPRAZOLAM 0.5 MG PO TABS
0.2500 mg | ORAL_TABLET | Freq: Three times a day (TID) | ORAL | Status: DC | PRN
  Administered 2018-05-28 – 2018-05-29 (×2): 0.25 mg via ORAL
  Filled 2018-05-28 (×2): qty 1

## 2018-05-28 MED ORDER — PREDNISONE 20 MG PO TABS
40.0000 mg | ORAL_TABLET | Freq: Every day | ORAL | Status: DC
  Administered 2018-05-28 – 2018-05-29 (×2): 40 mg via ORAL
  Filled 2018-05-28: qty 2
  Filled 2018-05-28: qty 4

## 2018-05-28 MED ORDER — NITROGLYCERIN 0.4 MG SL SUBL: 0.4000 mg | SUBLINGUAL_TABLET | SUBLINGUAL | Status: DC | PRN

## 2018-05-28 MED ORDER — POTASSIUM CHLORIDE CRYS ER 20 MEQ PO TBCR
40.0000 meq | EXTENDED_RELEASE_TABLET | Freq: Once | ORAL | Status: AC
  Administered 2018-05-28: 40 meq via ORAL
  Filled 2018-05-28: qty 2

## 2018-05-28 MED ORDER — BISACODYL 5 MG PO TBEC: 5.0000 mg | DELAYED_RELEASE_TABLET | Freq: Every day | ORAL | Status: DC | PRN

## 2018-05-28 MED ORDER — MAGNESIUM SULFATE 2 GM/50ML IV SOLN
2.0000 g | Freq: Once | INTRAVENOUS | Status: AC
  Administered 2018-05-28: 2 g via INTRAVENOUS
  Filled 2018-05-28: qty 50

## 2018-05-28 MED ORDER — ONDANSETRON HCL 4 MG/2ML IJ SOLN: 4.0000 mg | Freq: Four times a day (QID) | INTRAMUSCULAR | Status: DC | PRN

## 2018-05-28 MED ORDER — IPRATROPIUM-ALBUTEROL 0.5-2.5 (3) MG/3ML IN SOLN
3.0000 mL | Freq: Once | RESPIRATORY_TRACT | Status: AC
  Administered 2018-05-28: 3 mL via RESPIRATORY_TRACT
  Filled 2018-05-28: qty 3

## 2018-05-28 MED ORDER — AMITRIPTYLINE HCL 10 MG PO TABS
15.0000 mg | ORAL_TABLET | Freq: Every day | ORAL | Status: DC
  Administered 2018-05-28: 15 mg via ORAL
  Filled 2018-05-28 (×2): qty 1.5

## 2018-05-28 MED ORDER — ASPIRIN EC 81 MG PO TBEC
81.0000 mg | DELAYED_RELEASE_TABLET | Freq: Every day | ORAL | Status: DC
  Filled 2018-05-28 (×2): qty 1

## 2018-05-28 MED ORDER — DABIGATRAN ETEXILATE MESYLATE 150 MG PO CAPS
150.0000 mg | ORAL_CAPSULE | Freq: Two times a day (BID) | ORAL | Status: DC
  Administered 2018-05-28 – 2018-05-29 (×3): 150 mg via ORAL
  Filled 2018-05-28 (×4): qty 1

## 2018-05-28 MED ORDER — MORPHINE SULFATE (PF) 2 MG/ML IV SOLN: 2.0000 mg | INTRAVENOUS | Status: DC | PRN

## 2018-05-28 MED ORDER — IPRATROPIUM-ALBUTEROL 0.5-2.5 (3) MG/3ML IN SOLN
3.0000 mL | RESPIRATORY_TRACT | Status: DC
  Administered 2018-05-28: 3 mL via RESPIRATORY_TRACT
  Filled 2018-05-28: qty 3

## 2018-05-28 MED ORDER — BUSPIRONE HCL 5 MG PO TABS
10.0000 mg | ORAL_TABLET | Freq: Two times a day (BID) | ORAL | Status: DC
  Administered 2018-05-28 – 2018-05-29 (×3): 10 mg via ORAL
  Filled 2018-05-28 (×4): qty 2

## 2018-05-28 MED ORDER — NITROGLYCERIN 2 % TD OINT
1.0000 [in_us] | TOPICAL_OINTMENT | Freq: Once | TRANSDERMAL | Status: AC
  Administered 2018-05-28: 1 [in_us] via TOPICAL
  Filled 2018-05-28: qty 1

## 2018-05-28 MED ORDER — MORPHINE SULFATE (PF) 4 MG/ML IV SOLN: 4.0000 mg | INTRAVENOUS | Status: DC | PRN

## 2018-05-28 MED ORDER — ATORVASTATIN CALCIUM 20 MG PO TABS
80.0000 mg | ORAL_TABLET | Freq: Every day | ORAL | Status: DC
  Administered 2018-05-28: 80 mg via ORAL
  Filled 2018-05-28: qty 4

## 2018-05-28 MED ORDER — ACETAMINOPHEN 325 MG PO TABS
650.0000 mg | ORAL_TABLET | ORAL | Status: DC | PRN
  Administered 2018-05-28 – 2018-05-29 (×3): 650 mg via ORAL
  Filled 2018-05-28 (×3): qty 2

## 2018-05-28 MED ORDER — IPRATROPIUM-ALBUTEROL 0.5-2.5 (3) MG/3ML IN SOLN
3.0000 mL | Freq: Four times a day (QID) | RESPIRATORY_TRACT | Status: DC
  Administered 2018-05-28 – 2018-05-29 (×4): 3 mL via RESPIRATORY_TRACT
  Filled 2018-05-28 (×4): qty 3

## 2018-05-28 MED ORDER — SENNOSIDES-DOCUSATE SODIUM 8.6-50 MG PO TABS: 1.0000 | ORAL_TABLET | Freq: Every evening | ORAL | Status: DC | PRN

## 2018-05-28 MED ORDER — TRAZODONE HCL 50 MG PO TABS
50.0000 mg | ORAL_TABLET | Freq: Every evening | ORAL | Status: DC | PRN
  Administered 2018-05-29: 50 mg via ORAL
  Filled 2018-05-28: qty 1

## 2018-05-28 MED ORDER — MOMETASONE FURO-FORMOTEROL FUM 200-5 MCG/ACT IN AERO
2.0000 | INHALATION_SPRAY | Freq: Two times a day (BID) | RESPIRATORY_TRACT | Status: DC
  Administered 2018-05-28 – 2018-05-29 (×3): 2 via RESPIRATORY_TRACT
  Filled 2018-05-28: qty 8.8

## 2018-05-28 MED ORDER — ISOSORBIDE MONONITRATE ER 60 MG PO TB24
60.0000 mg | ORAL_TABLET | Freq: Every day | ORAL | Status: DC
  Administered 2018-05-28 – 2018-05-29 (×2): 60 mg via ORAL
  Filled 2018-05-28 (×2): qty 1

## 2018-05-28 MED ORDER — METHYLPREDNISOLONE SODIUM SUCC 125 MG IJ SOLR
125.0000 mg | Freq: Once | INTRAMUSCULAR | Status: AC
  Administered 2018-05-28: 125 mg via INTRAVENOUS
  Filled 2018-05-28: qty 2

## 2018-05-28 MED ORDER — METOPROLOL TARTRATE 50 MG PO TABS
100.0000 mg | ORAL_TABLET | Freq: Two times a day (BID) | ORAL | Status: DC
  Administered 2018-05-28 – 2018-05-29 (×3): 100 mg via ORAL
  Filled 2018-05-28 (×3): qty 2

## 2018-05-28 MED ORDER — DIPHENHYDRAMINE HCL 25 MG PO CAPS
25.0000 mg | ORAL_CAPSULE | Freq: Four times a day (QID) | ORAL | Status: DC | PRN
  Administered 2018-05-29: 25 mg via ORAL
  Filled 2018-05-28: qty 1

## 2018-05-28 MED ORDER — PANTOPRAZOLE SODIUM 40 MG PO TBEC
40.0000 mg | DELAYED_RELEASE_TABLET | Freq: Every day | ORAL | Status: DC
  Administered 2018-05-28 – 2018-05-29 (×2): 40 mg via ORAL
  Filled 2018-05-28 (×2): qty 1

## 2018-05-28 MED ORDER — ORAL CARE MOUTH RINSE
15.0000 mL | Freq: Two times a day (BID) | OROMUCOSAL | Status: DC
  Administered 2018-05-28 – 2018-05-29 (×3): 15 mL via OROMUCOSAL

## 2018-05-28 MED ORDER — LISINOPRIL 20 MG PO TABS
20.0000 mg | ORAL_TABLET | Freq: Every day | ORAL | Status: DC
  Administered 2018-05-28 – 2018-05-29 (×2): 20 mg via ORAL
  Filled 2018-05-28 (×2): qty 1

## 2018-05-28 MED ORDER — ASPIRIN 81 MG PO CHEW
324.0000 mg | CHEWABLE_TABLET | Freq: Once | ORAL | Status: AC
  Administered 2018-05-28: 324 mg via ORAL
  Filled 2018-05-28: qty 4

## 2018-05-28 MED ORDER — GABAPENTIN 100 MG PO CAPS
100.0000 mg | ORAL_CAPSULE | Freq: Every day | ORAL | Status: DC
  Administered 2018-05-29: 100 mg via ORAL
  Filled 2018-05-28: qty 1

## 2018-05-28 NOTE — Plan of Care (Signed)
  Problem: Activity: Goal: Risk for activity intolerance will decrease Outcome: Progressing   Problem: Pain Managment: Goal: General experience of comfort will improve Outcome: Progressing   Problem: Education: Goal: Knowledge of General Education information will improve Outcome: Completed/Met   Pt admitted from ED with chest pain, COPD exacerbation. Tele box [id number], verified by Lowella Petties, skin also verified by Lowella Petties, pt does have psoriasis. No more pain.

## 2018-05-28 NOTE — ED Triage Notes (Signed)
Pt presents to ED with intermittent chest tightness for the past week and increasing shortness of breath. Pt currently uses 1L of O2 as needed. Pt talkative and answering questions without difficulty. No distress noted.

## 2018-05-28 NOTE — Progress Notes (Signed)
Darius Norris at [place] NAME: Darius Norris    MR#:  [id number]  DATE OF BIRTH:  [birth year]  SUBJECTIVE:  CHIEF COMPLAINT:   Chief Complaint  Patient presents with  . Chest Pain  . Shortness of Breath  Patient seen and evaluated today No chest pain Decreased shortness of breath Decreased wheezing REVIEW OF SYSTEMS:    ROS  CONSTITUTIONAL: No documented fever. No fatigue, weakness. No weight gain, no weight loss.  EYES: No blurry or double vision.  ENT: No tinnitus. No postnasal drip. No redness of the oropharynx.  RESPIRATORY: No cough, no wheeze, no hemoptysis.  Decreased dyspnea.  CARDIOVASCULAR: No chest pain. No orthopnea. No palpitations. No syncope.  GASTROINTESTINAL: No nausea, no vomiting or diarrhea. No abdominal pain. No melena or hematochezia.  GENITOURINARY: No dysuria or hematuria.  ENDOCRINE: No polyuria or nocturia. No heat or cold intolerance.  HEMATOLOGY: No anemia. No bruising. No bleeding.  INTEGUMENTARY: No rashes. No lesions.  MUSCULOSKELETAL: No arthritis. No swelling. No gout.  NEUROLOGIC: No numbness, tingling, or ataxia. No seizure-type activity.  PSYCHIATRIC: No anxiety. No insomnia. No ADD.   DRUG ALLERGIES:   Allergies  Allergen Reactions  . Zantac [Ranitidine Hcl] Shortness Of Breath  . Codeine Itching  . Erythromycin Hives  . Morphine And Related Itching  . Vicodin [Hydrocodone-Acetaminophen] Rash    VITALS:  Blood pressure 139/77, pulse 67, temperature 98.1 F (36.7 C), resp. rate 14, height 5\' 6"  (1.676 m), weight 92.2 kg (203 lb 4.8 oz), SpO2 97 %.  PHYSICAL EXAMINATION:   Physical Exam  GENERAL:  67 y.o.-year-old patient lying in the bed with no acute distress.  EYES: Pupils equal, round, reactive to light and accommodation. No scleral icterus. Extraocular muscles intact.  HEENT: Head atraumatic, normocephalic. Oropharynx and nasopharynx clear.  NECK:  Supple, no jugular venous distention.  No thyroid enlargement, no tenderness.  LUNGS: Improved breath sounds bilaterally,  scattered wheezing. No use of accessory muscles of respiration.  CARDIOVASCULAR: S1, S2 normal. No murmurs, rubs, or gallops.  ABDOMEN: Soft, nontender, nondistended. Bowel sounds present. No organomegaly or mass.  EXTREMITIES: No cyanosis, clubbing or edema b/l.    NEUROLOGIC: Cranial nerves II through XII are intact. No focal Motor or sensory deficits b/l.   PSYCHIATRIC: The patient is alert and oriented x 3.  SKIN: No obvious rash, lesion, or ulcer.   LABORATORY PANEL:   CBC Recent Labs  Lab 05/28/18 0419  WBC 10.1  HGB 15.2  HCT 45.3  PLT 249   ------------------------------------------------------------------------------------------------------------------ Chemistries  Recent Labs  Lab 05/28/18 0419  NA 138  K 3.2*  CL 102  CO2 27  GLUCOSE 123*  BUN 10  CREATININE 1.04  CALCIUM 9.1   ------------------------------------------------------------------------------------------------------------------  Cardiac Enzymes Recent Labs  Lab 05/28/18 1057  TROPONINI <0.03   ------------------------------------------------------------------------------------------------------------------  RADIOLOGY:  Dg Chest 2 View  Result Date: 05/28/2018 CLINICAL DATA:  Initial evaluation for intermittent chest tightness, increasing shortness of breath. EXAM: CHEST - 2 VIEW COMPARISON:  Prior radiograph from 05/02/2018. FINDINGS: Cardiac and mediastinal silhouettes are stable in size and contour, and remain within normal limits. Lungs are hyperinflated with attenuation of the pulmonary markings superiorly, consistent with emphysema. Bibasilar peribronchial thickening, similar to previous, likely chronic. No other focal infiltrates. No pulmonary edema or pleural effusion. No pneumothorax. No acute osseous abnormality. IMPRESSION: 1. Emphysema. Associated bibasilar peribronchial thickening similar to prior and  is likely chronic. 2. No other active cardiopulmonary disease. Electronically Signed  By: Jeannine Boga M.D.   On: 05/28/2018 04:10     ASSESSMENT AND PLAN:  67 year old male patient with history of COPD on oxygen via nasal cannula at 1 L, atrial fibrillation on Pradaxa for anticoagulation, coronary artery disease admitted under hospitalist service for shortness of breath.  -Acute on chronic hypoxic respiratory failure Continue oxygen via nasal cannula  -Acute COPD exacerbation Duo nebulizations Incentive spirometry Pulmonary toileting Continue oral steroids  -Hypokalemia acute Replace potassium  -DVT prophylaxis On pradaxa   All the records are reviewed and case discussed with Care Management/Social Worker. Management plans discussed with the patient, family and they are in agreement.  CODE STATUS: Full code  DVT Prophylaxis: SCDs  TOTAL TIME TAKING CARE OF THIS PATIENT: 35 minutes.   POSSIBLE D/C IN 1 DAYS, DEPENDING ON CLINICAL CONDITION.  Saundra Shelling M.D on 05/28/2018 at 1:05 PM  Between 7am to 6pm - Pager - [phone]  After 6pm go to [email or web address] - password [email or web address] [facility]  Office  [phone]  CC: Primary care physician; [place]  Note: This dictation was prepared with Dragon dictation along with smaller phrase technology. Any transcriptional errors that result from this process are unintentional.

## 2018-05-28 NOTE — Progress Notes (Signed)
Pt requesting his gabapentin for his neuropathy, pt takes 100mg  at home at bedtime, this medication was not continued here. MD paged, Dr. Marcille Blanco to put in orders.  Conley Simmonds, RN, BSN

## 2018-05-28 NOTE — Progress Notes (Signed)
Advanced care plan. Purpose of the Encounter: CODE STATUS Parties in Attendance:Patient Patient's Decision Capacity:Good Subjective/Patient's story: Presented for shortness of breath and chest pain Objective/Medical story Has copd exacerbation Goals of care determination:  Advance care directives and goals of care discussed Patient wants everything done for now which includes cardiac resuscitation, intubation and ventilator if need arises CODE STATUS: Full code Time spent discussing advanced care planning: 16 minutes

## 2018-05-28 NOTE — H&P (Addendum)
[facility] [place] NAME: Darius Norris    MR#:  [id number]  DATE OF BIRTH:  [birth year]  DATE OF ADMISSION:  05/28/2018  PRIMARY CARE PHYSICIAN: [facility]   REQUESTING/REFERRING PHYSICIAN: Paulette Blanch, MD  CHIEF COMPLAINT:   Chief Complaint  Patient presents with  . Chest Pain  . Shortness of Breath    HISTORY OF PRESENT ILLNESS:  Darius Norris  is a 67 y.o. male with a known history of COPD (1L [place] home O2), Afib (Pradaxa 150 BID), CAD p/w CP/SOB. States he has chronic respiratory issues, but states his breathing has been acutely worse x~3-4d, progressively worsening over that time. He endorses diffuse chest tightness, as well as a sharp CP under the L breast. CP is coming and going, random, sometimes occurs at rest, no correlation w/ specific activities, movements or positional changes. CP is characterized as non-pleuritic, non-positional, non-radiating, non-reproducible and non-exertional. He denies N/V, palpitations, diaphoresis, LH/LOC. He endorses mild cough intermittently productive of small qty white/yellow sputum. He denies fever, chills, night sweats, rigors. He endorses mild diarrhea, but denies AP/N/V. He states he checked a home pulse oximetry, and saw the waveform was not uniform, resulting in ED visit. He was wheezing on arrival, but is diminished and clear (w/o respiratory distress or conversational dyspnea) at the time of my assessment. He does not exhibit tripoding, retractions or accessory muscle use. He is not on BiPAP, and is comfortable on [place] O2.  PAST MEDICAL HISTORY:   Past Medical History:  Diagnosis Date  . CAD (coronary artery disease)   . Colitis   . COPD (chronic obstructive pulmonary disease) ([place])   . Emphysema lung ([place])   . GERD (gastroesophageal reflux disease)   . Hypertension   . MI (myocardial infarction) ([place]) 1999    PAST SURGICAL HISTORY:   Past Surgical History:  Procedure  Laterality Date  . ABDOMINAL HERNIA REPAIR Bilateral   . CHOLECYSTECTOMY    . COLONOSCOPY WITH PROPOFOL N/A 02/01/2018   Procedure: COLONOSCOPY WITH PROPOFOL;  Surgeon: Jonathon Bellows, MD;  Location: [facility] ENDOSCOPY;  Service: Gastroenterology;  Laterality: N/A;  . KNEE ARTHROSCOPY Bilateral     SOCIAL HISTORY:   Social History   Tobacco Use  . Smoking status: Former Research scientist (life sciences)  . Smokeless tobacco: Never Used  Substance Use Topics  . Alcohol use: No    FAMILY HISTORY:   Family History  Problem Relation Age of Onset  . CAD Father   . Stroke Father   . CAD Sister   . Stroke Brother     DRUG ALLERGIES:   Allergies  Allergen Reactions  . Zantac [Ranitidine Hcl] Shortness Of Breath  . Codeine Itching  . Erythromycin Hives  . Vicodin [Hydrocodone-Acetaminophen] Rash    REVIEW OF SYSTEMS:   Review of Systems  Constitutional: Negative for chills, diaphoresis, fever, malaise/fatigue and weight loss.  HENT: Negative for congestion, ear pain, hearing loss, nosebleeds, sinus pain, sore throat and tinnitus.   Eyes: Negative for blurred vision, double vision and photophobia.  Respiratory: Positive for cough, sputum production, shortness of breath and wheezing. Negative for hemoptysis.   Cardiovascular: Positive for chest pain. Negative for palpitations, orthopnea, claudication, leg swelling and PND.  Gastrointestinal: Positive for diarrhea. Negative for abdominal pain, blood in stool, constipation, heartburn, melena, nausea and vomiting.  Genitourinary: Negative for dysuria, frequency, hematuria and urgency.  Musculoskeletal: Negative for back pain, joint pain, myalgias and neck pain.  Skin: Negative  for itching and rash.  Neurological: Negative for dizziness, tingling, tremors, sensory change, speech change, focal weakness, seizures, loss of consciousness, weakness and headaches.  Psychiatric/Behavioral: Negative for memory loss. The patient does not have insomnia.    MEDICATIONS AT  HOME:   Prior to Admission medications   Medication Sig Start Date End Date Taking? Authorizing Provider  acetaminophen (TYLENOL) 325 MG tablet Take 650 mg by mouth every 6 (six) hours as needed.     [provider]  amitriptyline (ELAVIL) 10 MG tablet Take 15 mg by mouth at bedtime.     [provider]  apixaban (ELIQUIS) 5 MG TABS tablet Take 1 tablet (5 mg total) by mouth 2 (two) times daily. 05/04/18   Fritzi Mandes, MD  atorvastatin (LIPITOR) 80 MG tablet Take 1 tablet (80 mg total) by mouth at bedtime. 11/18/17   Vaughan Basta, MD  budesonide-formoterol (SYMBICORT) 160-4.5 MCG/ACT inhaler Inhale 2 puffs into the lungs 2 (two) times daily.    [provider]  busPIRone (BUSPAR) 10 MG tablet Take 10 mg by mouth 2 (two) times daily.    [provider]  clobetasol cream (TEMOVATE) 8.11 % Apply 1 application topically daily as needed. For itching on psoriasis areas    [provider]  diclofenac sodium (VOLTAREN) 1 % GEL Apply 2 g topically 2 (two) times daily.    [provider]  fluticasone (FLONASE) 50 MCG/ACT nasal spray Place 2 sprays into both nostrils daily.    [provider]  Ipratropium-Albuterol (COMBIVENT RESPIMAT) 20-100 MCG/ACT AERS respimat Inhale 1 puff into the lungs every 6 (six) hours as needed for wheezing or shortness of breath.    [provider]  isosorbide mononitrate (IMDUR) 60 MG 24 hr tablet Take 60 mg by mouth daily.    [provider]  lactulose (CHRONULAC) 10 GM/15ML solution Take 30 mLs (20 g total) by mouth daily as needed for mild constipation. 12/11/17   Paulette Blanch, MD  lisinopril (PRINIVIL,ZESTRIL) 40 MG tablet Take 20 mg by mouth daily.     [provider]  loratadine (CLARITIN) 10 MG tablet Take 10 mg by mouth daily as needed for allergies.     [provider]  metoprolol tartrate (LOPRESSOR) 100 MG tablet Take 1 tablet (100 mg total) by mouth 2 (two) times  daily. 05/04/18   Fritzi Mandes, MD  omeprazole (PRILOSEC) 20 MG capsule Take 20 mg by mouth daily.     [provider]      VITAL SIGNS:  Blood pressure (!) 146/84, pulse 63, temperature 98 F (36.7 C), temperature source Oral, resp. rate 15, height 5\' 6"  (1.676 m), weight 90.7 kg (200 lb), SpO2 93 %.  PHYSICAL EXAMINATION:  Physical Exam  Constitutional: He is oriented to person, place, and time. He appears well-developed and well-nourished. He is active and cooperative.  Non-toxic appearance. He does not have a sickly appearance. He does not appear ill. No distress. He is not intubated. Nasal cannula in place.  HENT:  Head: Normocephalic and atraumatic.  Mouth/Throat: Oropharynx is clear and moist. No oropharyngeal exudate.  Eyes: Conjunctivae, EOM and lids are normal. No scleral icterus.  Neck: Neck supple. No JVD present. No thyromegaly present.  Cardiovascular: Normal rate, regular rhythm, S1 normal, S2 normal and normal heart sounds.  No extrasystoles are present. Exam reveals no gallop, no S3, no S4, no distant heart sounds and no friction rub.  No murmur heard. Pulmonary/Chest: Effort normal. No accessory muscle usage or  stridor. Tachypnea noted. No apnea and no bradypnea. He is not intubated. No respiratory distress. He has decreased breath sounds in the right upper field, the right middle field, the right lower field, the left upper field, the left middle field and the left lower field. He has no wheezes. He has no rhonchi. He has no rales.  Abdominal: Soft. Bowel sounds are normal. He exhibits no distension. There is no tenderness. There is no rebound and no guarding.  Musculoskeletal: Normal range of motion. He exhibits no edema or tenderness.       Right lower leg: Normal. He exhibits no tenderness and no edema.       Left lower leg: Normal. He exhibits no tenderness and no edema.  Lymphadenopathy:    He has no cervical adenopathy.  Neurological: He is alert and oriented  to person, place, and time. He is not disoriented.  Skin: Skin is warm, dry and intact. No rash noted. He is not diaphoretic. No erythema.  Psychiatric: He has a normal mood and affect. His speech is normal and behavior is normal. Judgment and thought content normal. His mood appears not anxious. He is not agitated. Cognition and memory are normal.   LABORATORY PANEL:   CBC Recent Labs  Lab 05/28/18 0419  WBC 10.1  HGB 15.2  HCT 45.3  PLT 249   ------------------------------------------------------------------------------------------------------------------  Chemistries  Recent Labs  Lab 05/28/18 0419  NA 138  K 3.2*  CL 102  CO2 27  GLUCOSE 123*  BUN 10  CREATININE 1.04  CALCIUM 9.1   ------------------------------------------------------------------------------------------------------------------  Cardiac Enzymes Recent Labs  Lab 05/28/18 0419  TROPONINI <0.03   ------------------------------------------------------------------------------------------------------------------  RADIOLOGY:  Dg Chest 2 View  Result Date: 05/28/2018 CLINICAL DATA:  Initial evaluation for intermittent chest tightness, increasing shortness of breath. EXAM: CHEST - 2 VIEW COMPARISON:  Prior radiograph from 05/02/2018. FINDINGS: Cardiac and mediastinal silhouettes are stable in size and contour, and remain within normal limits. Lungs are hyperinflated with attenuation of the pulmonary markings superiorly, consistent with emphysema. Bibasilar peribronchial thickening, similar to previous, likely chronic. No other focal infiltrates. No pulmonary edema or pleural effusion. No pneumothorax. No acute osseous abnormality. IMPRESSION: 1. Emphysema. Associated bibasilar peribronchial thickening similar to prior and is likely chronic. 2. No other active cardiopulmonary disease. Electronically Signed   By: Jeannine Boga M.D.   On: 05/28/2018 04:10   IMPRESSION AND PLAN:   A/P: 56M CP, acute on  chronic hypoxemic respiratory failure, mild acute COPD exacerbation, hypokalemia, hyperglycemia. Also w/ paroxysmal Afib, on Pradaxa, currently in sinus, HR 60s. -Tele, continuous cardiac monitoring -Pulse Ox -Regular on exam. EKG sinus, w/ ST depressions and TWI, largely unchanged from prior -EKG PRN -CXR (-) acute cardiopulmonary disease -Trop-I (-) x1, 2x rpt pending -ASA81 -Morphine, NTG PRN -O2 -c/w statin, beta blocker, ACE-I, long-acting nitrate -DuoNebs -PO steroids -Mild COPD exacerbation, ABx not indicated -Incentive spirometry -Pulmonary toileting -Dulera (formulary sub for home Symbicort) -Replete K+ -Ordered for Mag Sulfate -Hyperglycemia possibly 2/2 steroids -c/w home meds/formulary subs -Cardiac diet -Lovenox -Full code -Observation, < 2 midnights   All the records are reviewed and case discussed with ED provider. Management plans discussed with the patient, family and they are in agreement.  CODE STATUS: Full code  TOTAL TIME TAKING CARE OF THIS PATIENT: 90 minutes.    Arta Silence M.D on 05/28/2018 at 5:46 AM  Between 7am to 6pm - Pager - [phone]  After 6pm go to [email or web address] - [place]  [facility]  Office  [phone]  CC: Primary care physician; [place]   Note: This dictation was prepared with Dragon dictation along with smaller phrase technology. Any transcriptional errors that result from this process are unintentional.

## 2018-05-28 NOTE — Care Management Obs Status (Signed)
[place] NOTIFICATION   Patient Details  Name: ALBERTA CAIRNS MRN: [id number] Date of Birth: [birth year]   Medicare Observation Status Notification Given:  Yes    Shelbie Ammons, RN 05/28/2018, 10:10 AM

## 2018-05-28 NOTE — Plan of Care (Signed)
  Problem: Clinical Measurements: Goal: Respiratory complications will improve Outcome: Progressing   Problem: Activity: Goal: Risk for activity intolerance will decrease Outcome: Progressing   Problem: Activity: Goal: Capacity to carry out activities will improve Outcome: Progressing   Patient is presently resting in the bed, alert and oriented, denies pain at this time, SOB has improved as per pt, will continue to monitor

## 2018-05-28 NOTE — ED Provider Notes (Signed)
[facility] Emergency Department Provider Note   ____________________________________________   First MD Initiated Contact with Patient 05/28/18 [phone]     (approximate)  I have reviewed the triage vital signs and the nursing notes.   HISTORY  Chief Complaint Chest Pain and Shortness of Breath    HPI Darius Norris is a 67 y.o. male who presents to the ED from home with a chief complaint of chest tightness and increasing shortness of breath.  Patient has a history of COPD on continuous oxygenation, CAD status post MI, atrial fibrillation.  Reports a 1 week history of intermittent chest tightness and shortness of breath which is exacerbated with exertion.  [facility] weeks ago.  Denies fever, chills, abdominal pain, nausea, vomiting.  Denies recent travel or trauma.   Past Medical History:  Diagnosis Date  . CAD (coronary artery disease)   . Colitis   . COPD (chronic obstructive pulmonary disease) ([place])   . Emphysema lung ([place])   . GERD (gastroesophageal reflux disease)   . Hypertension   . MI (myocardial infarction) [facility]) 1999    Patient Active Problem List   Diagnosis Date Noted  . A-fib ([place]) 05/02/2018  . Chest pain 11/17/2017  . Unstable angina ([place]) 09/04/2016  . Uncontrolled hypertension 09/04/2016  . Hypokalemia 09/04/2016  . Leukocytosis 09/04/2016  . Hyperglycemia 09/04/2016  . COPD exacerbation ([place]) 12/15/2015  . Elevated troponin 12/15/2015  . HTN (hypertension) 12/15/2015  . GERD (gastroesophageal reflux disease) 12/15/2015  . CAD (coronary artery disease) 12/15/2015  . Angina pectoris ([place]) 12/15/2015    Past Surgical History:  Procedure Laterality Date  . ABDOMINAL HERNIA REPAIR Bilateral   . CHOLECYSTECTOMY    . COLONOSCOPY WITH PROPOFOL N/A 02/01/2018   Procedure: COLONOSCOPY WITH PROPOFOL;  Surgeon: Jonathon Bellows, MD;  Location: [facility] ENDOSCOPY;  Service: Gastroenterology;   Laterality: N/A;  . KNEE ARTHROSCOPY Bilateral     Prior to Admission medications   Medication Sig Start Date End Date Taking? Authorizing Provider  acetaminophen (TYLENOL) 325 MG tablet Take 650 mg by mouth every 6 (six) hours as needed.     [provider]  amitriptyline (ELAVIL) 10 MG tablet Take 15 mg by mouth at bedtime.     [provider]  apixaban (ELIQUIS) 5 MG TABS tablet Take 1 tablet (5 mg total) by mouth 2 (two) times daily. 05/04/18   Fritzi Mandes, MD  atorvastatin (LIPITOR) 80 MG tablet Take 1 tablet (80 mg total) by mouth at bedtime. 11/18/17   Vaughan Basta, MD  budesonide-formoterol (SYMBICORT) 160-4.5 MCG/ACT inhaler Inhale 2 puffs into the lungs 2 (two) times daily.    [provider]  busPIRone (BUSPAR) 10 MG tablet Take 10 mg by mouth 2 (two) times daily.    [provider]  clobetasol cream (TEMOVATE) 2.40 % Apply 1 application topically daily as needed. For itching on psoriasis areas    [provider]  diclofenac sodium (VOLTAREN) 1 % GEL Apply 2 g topically 2 (two) times daily.    [provider]  fluticasone (FLONASE) 50 MCG/ACT nasal spray Place 2 sprays into both nostrils daily.    [provider]  Ipratropium-Albuterol (COMBIVENT RESPIMAT) 20-100 MCG/ACT AERS respimat Inhale 1 puff into the lungs every 6 (six) hours as needed for wheezing or shortness of breath.    [provider]  isosorbide mononitrate (IMDUR) 60 MG 24 hr tablet Take 60 mg by mouth daily.  [provider]  lactulose (CHRONULAC) 10 GM/15ML solution Take 30 mLs (20 g total) by mouth daily as needed for mild constipation. 12/11/17   Paulette Blanch, MD  lisinopril (PRINIVIL,ZESTRIL) 40 MG tablet Take 20 mg by mouth daily.     [provider]  loratadine (CLARITIN) 10 MG tablet Take 10 mg by mouth daily as needed for allergies.     [provider]  metoprolol tartrate (LOPRESSOR) 100 MG tablet Take 1  tablet (100 mg total) by mouth 2 (two) times daily. 05/04/18   Fritzi Mandes, MD  omeprazole (PRILOSEC) 20 MG capsule Take 20 mg by mouth daily.     [provider]    Allergies Zantac [ranitidine hcl]; Codeine; Erythromycin; and Vicodin [hydrocodone-acetaminophen]  Family History  Problem Relation Age of Onset  . CAD Father   . Stroke Father   . CAD Sister   . Stroke Brother     Social History Social History   Tobacco Use  . Smoking status: Former Research scientist (life sciences)  . Smokeless tobacco: Never Used  Substance Use Topics  . Alcohol use: No  . Drug use: No    Review of Systems  Constitutional: No fever/chills Eyes: No visual changes. ENT: No sore throat. Cardiovascular: Positive for chest pain. Respiratory: Positive for shortness of breath. Gastrointestinal: No abdominal pain.  No nausea, no vomiting.  No diarrhea.  No constipation. Genitourinary: Negative for dysuria. Musculoskeletal: Negative for back pain. Skin: Negative for rash. Neurological: Negative for headaches, focal weakness or numbness.   ____________________________________________   PHYSICAL EXAM:  VITAL SIGNS: ED Triage Vitals  Enc Vitals Group     BP 05/28/18 0319 (!) 167/85     Pulse Rate 05/28/18 0319 66     Resp 05/28/18 0319 (!) 22     Temp 05/28/18 0319 98 F (36.7 C)     Temp Source 05/28/18 0319 Oral     SpO2 05/28/18 0319 95 %     Weight 05/28/18 0320 200 lb (90.7 kg)     Height 05/28/18 0320 5\' 6"  (1.676 m)     Head Circumference --      Peak Flow --      Pain Score 05/28/18 0319 1     Pain Loc --      Pain Edu? --      Excl. in [place]? --     Constitutional: Alert and oriented.  Chronically ill appearing and in moderate acute distress. Eyes: Conjunctivae are normal. PERRL. EOMI. Head: Atraumatic. Nose: No congestion/rhinnorhea. Mouth/Throat: Mucous membranes are moist.  Oropharynx non-erythematous. Neck: No stridor.   Cardiovascular: Normal rate, regular rhythm. Grossly normal  heart sounds.  Good peripheral circulation. Respiratory: Increased respiratory effort.  No retractions.  Pursed lip breathing.  Lungs diminished bilaterally. Gastrointestinal: Soft and nontender. No distention. No abdominal bruits. No CVA tenderness. Musculoskeletal: No lower extremity tenderness nor edema.  No joint effusions. Neurologic:  Normal speech and language. No gross focal neurologic deficits are appreciated.  Skin:  Skin is warm, dry and intact. No rash noted. Psychiatric: Mood and affect are normal. Speech and behavior are normal.  ____________________________________________   LABS (all labs ordered are listed, but only abnormal results are displayed)  Labs Reviewed  BASIC METABOLIC PANEL - Abnormal; Notable for the following components:      Result Value   Potassium 3.2 (*)    Glucose, Bld 123 (*)    All other components within normal limits  CBC - Abnormal; Notable for the following components:  RDW 16.8 (*)    All other components within normal limits  TROPONIN I  BRAIN NATRIURETIC PEPTIDE   ____________________________________________  EKG  ED ECG REPORT I, Milarose Savich J, the attending physician, personally viewed and interpreted this ECG.   Date: 05/28/2018  EKG Time: 0309  Rate: 66  Rhythm: normal EKG, normal sinus rhythm  Axis: Normal  Intervals:none  ST&T Change: T wave inversion in inferior lateral leads T wave inversion in lead V2 new from 05/08/2018  ____________________________________________  RADIOLOGY  ED MD interpretation: Emphysema; no acute cardiopulmonary process  Official radiology report(s): Dg Chest 2 View  Result Date: 05/28/2018 CLINICAL DATA:  Initial evaluation for intermittent chest tightness, increasing shortness of breath. EXAM: CHEST - 2 VIEW COMPARISON:  Prior radiograph from 05/02/2018. FINDINGS: Cardiac and mediastinal silhouettes are stable in size and contour, and remain within normal limits. Lungs are hyperinflated with  attenuation of the pulmonary markings superiorly, consistent with emphysema. Bibasilar peribronchial thickening, similar to previous, likely chronic. No other focal infiltrates. No pulmonary edema or pleural effusion. No pneumothorax. No acute osseous abnormality. IMPRESSION: 1. Emphysema. Associated bibasilar peribronchial thickening similar to prior and is likely chronic. 2. No other active cardiopulmonary disease. Electronically Signed   By: Jeannine Boga M.D.   On: 05/28/2018 04:10    ____________________________________________   PROCEDURES  Procedure(s) performed: None  Procedures  Critical Care performed: Yes, see critical care note(s)   CRITICAL CARE Performed by: Paulette Blanch   Total critical care time: 30 minutes  Critical care time was exclusive of separately billable procedures and treating other patients.  Critical care was necessary to treat or prevent imminent or life-threatening deterioration.  Critical care was time spent personally by me on the following activities: development of treatment plan with patient and/or surrogate as well as nursing, discussions with consultants, evaluation of patient's response to treatment, examination of patient, obtaining history from patient or surrogate, ordering and performing treatments and interventions, ordering and review of laboratory studies, ordering and review of radiographic studies, pulse oximetry and re-evaluation of patient's condition.  ____________________________________________   INITIAL IMPRESSION / ASSESSMENT AND PLAN / ED COURSE  As part of my medical decision making, I reviewed the following data within the [place] History obtained from family, Nursing notes reviewed and incorporated, Labs reviewed, EKG interpreted, Old chart reviewed, Radiograph reviewed, Discussed with admitting physician and Notes from prior ED visits   67 year old male with COPD, CAD, atrial fibrillation now on  Pradaxa who presents with intermittent chest tightness and worsening shortness of breath. Differential diagnosis includes, but is not limited to, ACS, aortic dissection, pulmonary embolism, cardiac tamponade, pneumothorax, pneumonia, pericarditis, myocarditis, GI-related causes including esophagitis/gastritis, and musculoskeletal chest wall pain.    Patient symptoms concerning for unstable angina.  Additionally he is having a COPD exacerbation.  Will administer 4 baby aspirin, nitroglycerin paste, 125 mg IV Solu-Medrol, DuoNeb.  Awaiting lab results.  Anticipate hospitalization.  Patient is a [place] patient but prefers to stay at this facility.      ____________________________________________   FINAL CLINICAL IMPRESSION(S) / ED DIAGNOSES  Final diagnoses:  Chest pain, unspecified type  Chronic obstructive pulmonary disease, unspecified COPD type ([place])  Unstable angina [facility])     ED Discharge Orders    None       Note:  This document was prepared using Dragon voice recognition software and may include unintentional dictation errors.    Paulette Blanch, MD 05/28/18 [phone]

## 2018-05-28 NOTE — Care Management Note (Addendum)
Case Management Note  Patient Details  Name: Darius Norris MRN: [id number] Date of Birth: Apr 22, 1951  Subjective/Objective:   Admitted to [facility] under observation status with the diagnosis of acute hypoxia. Lives with wife, [name] [phone]). Goes to [facility] for physician follow-up care and prescription medications. No home health. No skilled facility. Seen at [facility] 2 weeks ago. Chronic oxygen 2 liters per nasal cannula. Oxygen is provided bu [facility]. Nebulizer in the home. Uses a scooter to get around. Takes care of all basic activities of daily living himself. No falls. Fair-good appetite                 Action/Plan: Will continue to follow for discharge plans. Will issue Observation letter   Expected Discharge Date:                  Expected Discharge Plan:     In-House Referral:     Discharge planning Services     Post Acute Care Choice:    Choice offered to:     DME Arranged:    DME Agency:     HH Arranged:    HH Agency:     Status of Service:     If discussed at [employer] of Stay Meetings, dates discussed:    Additional Comments:  Shelbie Ammons, RN MSN CCM Care Management [phone] 05/28/2018, 7:58 AM

## 2018-05-29 LAB — BASIC METABOLIC PANEL
Anion gap: 10 (ref 5–15)
BUN: 15 mg/dL (ref 6–20)
CALCIUM: 9.5 mg/dL (ref 8.9–10.3)
CHLORIDE: 102 mmol/L (ref 101–111)
CO2: 27 mmol/L (ref 22–32)
Creatinine, Ser: 0.95 mg/dL (ref 0.61–1.24)
Glucose, Bld: 147 mg/dL — ABNORMAL HIGH (ref 65–99)
Potassium: 4 mmol/L (ref 3.5–5.1)
SODIUM: 139 mmol/L (ref 135–145)

## 2018-05-29 LAB — HIV ANTIBODY (ROUTINE TESTING W REFLEX): HIV SCREEN 4TH GENERATION: NONREACTIVE

## 2018-05-29 LAB — MAGNESIUM: MAGNESIUM: 2.3 mg/dL (ref 1.7–2.4)

## 2018-05-29 LAB — PHOSPHORUS: Phosphorus: 2.4 mg/dL — ABNORMAL LOW (ref 2.5–4.6)

## 2018-05-29 MED ORDER — PREDNISONE 20 MG PO TABS: 40.0000 mg | ORAL_TABLET | Freq: Every day | ORAL | 0 refills | Status: AC

## 2018-05-29 MED ORDER — TRAZODONE HCL 50 MG PO TABS: 50.0000 mg | ORAL_TABLET | Freq: Every evening | ORAL | 0 refills | Status: DC | PRN

## 2018-05-29 NOTE — Progress Notes (Signed)
Patient discharged via wheelchair and private vehicle. IV removed and catheter intact. All discharge instructions given and patient verbalizes understanding. Tele removed and returned. Prescriptions given to patient No distress noted.   

## 2018-05-29 NOTE — Plan of Care (Signed)
  Problem: Health Behavior/Discharge Planning: Goal: Ability to manage health-related needs will improve Outcome: Progressing   Problem: Activity: Goal: Risk for activity intolerance will decrease Outcome: Progressing   Treated for insomnia twice, first with benadryl-pt preference , which did not help, then with trazodone, which also did not really help. Treated for a headache twice with tylenol. Pt continued to be on 1L which is chronic, up independently in room. BM this shift, 05/28/18.

## 2018-05-29 NOTE — Care Management (Signed)
No discharge needs identified by members of the care team 

## 2018-05-29 NOTE — Discharge Summary (Addendum)
[facility] at [place] NAME: Taniela Feltus    MR#:  [id number]  DATE OF BIRTH:  12/20/1950  DATE OF ADMISSION:  05/28/2018 ADMITTING PHYSICIAN: Arta Silence, MD  DATE OF DISCHARGE: No discharge date for patient encounter.  PRIMARY CARE PHYSICIAN: [facility]    ADMISSION DIAGNOSIS:  Unstable angina ([place]) [I20.0] Chest pain, unspecified type [R07.9] Chronic obstructive pulmonary disease, unspecified COPD type ([place]) [J44.9]  DISCHARGE DIAGNOSIS:  Active Problems:   Acute hypoxemic respiratory failure ([place])   SECONDARY DIAGNOSIS:   Past Medical History:  Diagnosis Date  . CAD (coronary artery disease)   . Colitis   . COPD (chronic obstructive pulmonary disease) ([place])   . Emphysema lung ([place])   . GERD (gastroesophageal reflux disease)   . Hypertension   . MI (myocardial infarction) ([place]) 1999    HOSPITAL COURSE:   67 year old male patient with history of COPD on oxygen via nasal cannula at 1 L, atrial fibrillation on Pradaxa for anticoagulation, coronary artery disease admitted under hospitalist service for shortness of breath.  -Acute on chronic hypoxic respiratory failure Resolved Treated with supplemental oxygen weaning back to baseline of 1 L via nasal cannula continuous   -Acute COPD exacerbation Resolved Treated with steroids, aggressive pulmonary toilet, and patient did well   -Hypokalemia acute Replaced  DISCHARGE CONDITIONS:   stable  CONSULTS OBTAINED:  Treatment Team:  Arta Silence, MD  DRUG ALLERGIES:   Allergies  Allergen Reactions  . Zantac [Ranitidine Hcl] Shortness Of Breath  . Codeine Itching  . Erythromycin Hives  . Morphine And Related Itching  . Vicodin [Hydrocodone-Acetaminophen] Rash    DISCHARGE MEDICATIONS:   Allergies as of 05/29/2018      Reactions   Zantac [ranitidine Hcl] Shortness Of Breath   Codeine Itching   Erythromycin Hives   Morphine  And Related Itching   Vicodin [hydrocodone-acetaminophen] Rash      Medication List    TAKE these medications   acetaminophen 325 MG tablet Commonly known as:  TYLENOL Take 650 mg by mouth every 6 (six) hours as needed.   amitriptyline 10 MG tablet Commonly known as:  ELAVIL Take 15 mg by mouth at bedtime.   atorvastatin 80 MG tablet Commonly known as:  LIPITOR Take 1 tablet (80 mg total) by mouth at bedtime.   budesonide-formoterol 160-4.5 MCG/ACT inhaler Commonly known as:  SYMBICORT Inhale 2 puffs into the lungs 2 (two) times daily.   busPIRone 10 MG tablet Commonly known as:  BUSPAR Take 10 mg by mouth 2 (two) times daily.   clobetasol cream 0.05 % Commonly known as:  TEMOVATE Apply 1 application topically daily as needed. For itching on psoriasis areas   COMBIVENT RESPIMAT 20-100 MCG/ACT Aers respimat Generic drug:  Ipratropium-Albuterol Inhale 1 puff into the lungs every 6 (six) hours as needed for wheezing or shortness of breath.   dabigatran 150 MG Caps capsule Commonly known as:  PRADAXA Take 150 mg by mouth 2 (two) times daily.   fluticasone 50 MCG/ACT nasal spray Commonly known as:  FLONASE Place 2 sprays into both nostrils daily.   gabapentin 100 MG capsule Commonly known as:  NEURONTIN Take 100 mg by mouth at bedtime.   isosorbide mononitrate 60 MG 24 hr tablet Commonly known as:  IMDUR Take 60 mg by mouth daily.   lisinopril 40 MG tablet Commonly known as:  PRINIVIL,ZESTRIL Take 20 mg by mouth daily.   loratadine 10 MG tablet Commonly known  as:  CLARITIN Take 10 mg by mouth daily as needed for allergies.   metoprolol tartrate 100 MG tablet Commonly known as:  LOPRESSOR Take 1 tablet (100 mg total) by mouth 2 (two) times daily.   omeprazole 20 MG capsule Commonly known as:  PRILOSEC Take 20 mg by mouth daily.   potassium chloride 10 MEQ tablet Commonly known as:  K-DUR,KLOR-CON Take 10 mEq by mouth daily.   predniSONE 20 MG  tablet Commonly known as:  DELTASONE Take 2 tablets (40 mg total) by mouth daily with breakfast for 7 days. Start taking on:  05/30/2018   senna 8.6 MG Tabs tablet Commonly known as:  SENOKOT Take 1 tablet by mouth daily as needed for mild constipation.   traZODone 50 MG tablet Commonly known as:  DESYREL Take 1 tablet (50 mg total) by mouth at bedtime as needed for sleep.        DISCHARGE INSTRUCTIONS:  If you experience worsening of your admission symptoms, develop shortness of breath, life threatening emergency, suicidal or homicidal thoughts you must seek medical attention immediately by calling 911 or calling your MD immediately  if symptoms less severe.  You Must read complete instructions/literature along with all the possible adverse reactions/side effects for all the Medicines you take and that have been prescribed to you. Take any new Medicines after you have completely understood and accept all the possible adverse reactions/side effects.   Please note  You were cared for by a hospitalist during your hospital stay. If you have any questions about your discharge medications or the care you received while you were in the hospital after you are discharged, you can call the unit and asked to speak with the hospitalist on call if the hospitalist that took care of you is not available. Once you are discharged, your primary care physician will handle any further medical issues. Please note that NO REFILLS for any discharge medications will be authorized once you are discharged, as it is imperative that you return to your primary care physician (or establish a relationship with a primary care physician if you do not have one) for your aftercare needs so that they can reassess your need for medications and monitor your lab values.    Today   CHIEF COMPLAINT:   Chief Complaint  Patient presents with  . Chest Pain  . Shortness of Breath    HISTORY OF PRESENT ILLNESS:   67 y.o.  male with a known history of COPD (1L [place] home O2), Afib (Pradaxa 150 BID), CAD p/w CP/SOB. States he has chronic respiratory issues, but states his breathing has been acutely worse x~3-4d, progressively worsening over that time. He endorses diffuse chest tightness, as well as a sharp CP under the L breast. CP is coming and going, random, sometimes occurs at rest, no correlation w/ specific activities, movements or positional changes. CP is characterized as non-pleuritic, non-positional, non-radiating, non-reproducible and non-exertional. He denies N/V, palpitations, diaphoresis, LH/LOC. He endorses mild cough intermittently productive of small qty white/yellow sputum. He denies fever, chills, night sweats, rigors. He endorses mild diarrhea, but denies AP/N/V. He states he checked a home pulse oximetry, and saw the waveform was not uniform, resulting in ED visit. He was wheezing on arrival, but is diminished and clear (w/o respiratory distress or conversational dyspnea) at the time of my assessment. He does not exhibit tripoding, retractions or accessory muscle use. He is not on BiPAP, and is comfortable on [place] O2.  VITAL SIGNS:  Blood pressure (!) 146/83, pulse 77, temperature 98 F (36.7 C), temperature source Oral, resp. rate 18, height 5\' 6"  (1.676 m), weight 92.2 kg (203 lb 4.8 oz), SpO2 96 %.  I/O:    Intake/Output Summary (Last 24 hours) at 05/29/2018 1029 Last data filed at 05/29/2018 0516 Gross per 24 hour  Intake -  Output 650 ml  Net -650 ml    PHYSICAL EXAMINATION:  GENERAL:  67 y.o.-year-old patient lying in the bed with no acute distress.  EYES: Pupils equal, round, reactive to light and accommodation. No scleral icterus. Extraocular muscles intact.  HEENT: Head atraumatic, normocephalic. Oropharynx and nasopharynx clear.  NECK:  Supple, no jugular venous distention. No thyroid enlargement, no tenderness.  LUNGS: Normal breath sounds bilaterally, no wheezing, rales,rhonchi or  crepitation. No use of accessory muscles of respiration.  CARDIOVASCULAR: S1, S2 normal. No murmurs, rubs, or gallops.  ABDOMEN: Soft, non-tender, non-distended. Bowel sounds present. No organomegaly or mass.  EXTREMITIES: No pedal edema, cyanosis, or clubbing.  NEUROLOGIC: Cranial nerves II through XII are intact. Muscle strength 5/5 in all extremities. Sensation intact. Gait not checked.  PSYCHIATRIC: The patient is alert and oriented x 3.  SKIN: No obvious rash, lesion, or ulcer.   DATA REVIEW:   CBC Recent Labs  Lab 05/28/18 0419  WBC 10.1  HGB 15.2  HCT 45.3  PLT 249    Chemistries  Recent Labs  Lab 05/29/18 0603  NA 139  K 4.0  CL 102  CO2 27  GLUCOSE 147*  BUN 15  CREATININE 0.95  CALCIUM 9.5  MG 2.3    Cardiac Enzymes Recent Labs  Lab 05/28/18 1057  TROPONINI <0.03    Microbiology Results  Results for orders placed or performed during the hospital encounter of 09/04/16  Culture, expectorated sputum-assessment     Status: None   Collection Time: 09/05/16  6:35 AM  Result Value Ref Range Status   Specimen Description EXPECTORATED SPUTUM  Final   Special Requests NONE  Final   Sputum evaluation THIS SPECIMEN IS ACCEPTABLE FOR SPUTUM CULTURE  Final   Report Status 09/05/2016 FINAL  Final  Culture, respiratory (NON-Expectorated)     Status: None   Collection Time: 09/05/16  6:35 AM  Result Value Ref Range Status   Specimen Description EXPECTORATED SPUTUM  Final   Special Requests NONE Reflexed from [id number]  Final   Gram Stain   Final    ABUNDANT WBC PRESENT,BOTH PMN AND MONONUCLEAR ABUNDANT GRAM POSITIVE COCCI IN CHAINS IN PAIRS RARE YEAST    Culture   Final    Consistent with normal respiratory flora. Performed at [facility]    Report Status 09/07/2016 FINAL  Final    RADIOLOGY:  Dg Chest 2 View  Result Date: 05/28/2018 CLINICAL DATA:  Initial evaluation for intermittent chest tightness, increasing shortness of breath. EXAM: CHEST - 2  VIEW COMPARISON:  Prior radiograph from 05/02/2018. FINDINGS: Cardiac and mediastinal silhouettes are stable in size and contour, and remain within normal limits. Lungs are hyperinflated with attenuation of the pulmonary markings superiorly, consistent with emphysema. Bibasilar peribronchial thickening, similar to previous, likely chronic. No other focal infiltrates. No pulmonary edema or pleural effusion. No pneumothorax. No acute osseous abnormality. IMPRESSION: 1. Emphysema. Associated bibasilar peribronchial thickening similar to prior and is likely chronic. 2. No other active cardiopulmonary disease. Electronically Signed   By: Jeannine Boga M.D.   On: 05/28/2018 04:10    EKG:   Orders placed or performed  during the hospital encounter of 05/28/18  . EKG 12-Lead  . EKG 12-Lead  . EKG 12-Lead  . EKG 12-Lead  . ED EKG within 10 minutes  . ED EKG within 10 minutes      Management plans discussed with the patient, family and they are in agreement.  CODE STATUS:     Code Status Orders  (From admission, onward)        Start     Ordered   05/28/18 0623  Full code  Continuous     05/28/18 0623    Code Status History    Date Active Date Inactive Code Status Order ID Comments User Context   05/02/2018 1239 05/04/2018 1603 Full Code [id number]  Gorden Harms, MD Inpatient   11/17/2017 0837 11/18/2017 1635 Full Code [id number]  Harrie Foreman, MD Inpatient   09/04/2016 1959 09/05/2016 1736 Full Code [id number]  Theodoro Grist, MD Inpatient   12/15/2015 0158 12/15/2015 1922 Full Code [id number]  Lance Coon, MD Inpatient      TOTAL TIME TAKING CARE OF THIS PATIENT: 45 minutes.    Avel Peace Alverda Nazzaro M.D on 05/29/2018 at 10:29 AM  Between 7am to 6pm - Pager - [phone]  After 6pm go to [email or web address] - password [email or web address] [facility]  Office  [phone]  CC: Primary care physician; [place]   Note: This dictation was prepared with  Dragon dictation along with smaller phrase technology. Any transcriptional errors that result from this process are unintentional.

## 2018-06-07 ENCOUNTER — Observation Stay
Admission: EM | Admit: 2018-06-07 | Discharge: 2018-06-08 | Disposition: A | Discharge status: Home / Self Care | Payer: Medicare HMO | Attending: Family Medicine | Admitting: Family Medicine

## 2018-06-07 ENCOUNTER — Encounter: Payer: Self-pay | Admitting: Emergency Medicine

## 2018-06-07 ENCOUNTER — Other Ambulatory Visit: Payer: Self-pay

## 2018-06-07 ENCOUNTER — Emergency Department: Payer: Medicare HMO

## 2018-06-07 DIAGNOSIS — Z79899 Other long term (current) drug therapy: Secondary | ICD-10-CM | POA: Insufficient documentation

## 2018-06-07 DIAGNOSIS — Z8249 Family history of ischemic heart disease and other diseases of the circulatory system: Secondary | ICD-10-CM | POA: Insufficient documentation

## 2018-06-07 DIAGNOSIS — J439 Emphysema, unspecified: Secondary | ICD-10-CM | POA: Diagnosis not present

## 2018-06-07 DIAGNOSIS — R079 Chest pain, unspecified: Secondary | ICD-10-CM | POA: Diagnosis present

## 2018-06-07 DIAGNOSIS — R0789 Other chest pain: Principal | ICD-10-CM | POA: Insufficient documentation

## 2018-06-07 DIAGNOSIS — Z7982 Long term (current) use of aspirin: Secondary | ICD-10-CM | POA: Diagnosis not present

## 2018-06-07 DIAGNOSIS — Z885 Allergy status to narcotic agent status: Secondary | ICD-10-CM | POA: Insufficient documentation

## 2018-06-07 DIAGNOSIS — I10 Essential (primary) hypertension: Secondary | ICD-10-CM | POA: Diagnosis not present

## 2018-06-07 DIAGNOSIS — I252 Old myocardial infarction: Secondary | ICD-10-CM | POA: Insufficient documentation

## 2018-06-07 DIAGNOSIS — I25110 Atherosclerotic heart disease of native coronary artery with unstable angina pectoris: Secondary | ICD-10-CM | POA: Diagnosis not present

## 2018-06-07 DIAGNOSIS — Z7901 Long term (current) use of anticoagulants: Secondary | ICD-10-CM | POA: Insufficient documentation

## 2018-06-07 DIAGNOSIS — Z881 Allergy status to other antibiotic agents status: Secondary | ICD-10-CM | POA: Diagnosis not present

## 2018-06-07 DIAGNOSIS — Z888 Allergy status to other drugs, medicaments and biological substances status: Secondary | ICD-10-CM | POA: Diagnosis not present

## 2018-06-07 DIAGNOSIS — Z823 Family history of stroke: Secondary | ICD-10-CM | POA: Insufficient documentation

## 2018-06-07 DIAGNOSIS — I471 Supraventricular tachycardia: Secondary | ICD-10-CM | POA: Diagnosis not present

## 2018-06-07 DIAGNOSIS — K219 Gastro-esophageal reflux disease without esophagitis: Secondary | ICD-10-CM | POA: Diagnosis not present

## 2018-06-07 DIAGNOSIS — Z9049 Acquired absence of other specified parts of digestive tract: Secondary | ICD-10-CM | POA: Diagnosis not present

## 2018-06-07 DIAGNOSIS — I480 Paroxysmal atrial fibrillation: Secondary | ICD-10-CM | POA: Diagnosis not present

## 2018-06-07 DIAGNOSIS — I4891 Unspecified atrial fibrillation: Secondary | ICD-10-CM | POA: Diagnosis present

## 2018-06-07 DIAGNOSIS — R55 Syncope and collapse: Secondary | ICD-10-CM

## 2018-06-07 DIAGNOSIS — Z87891 Personal history of nicotine dependence: Secondary | ICD-10-CM | POA: Insufficient documentation

## 2018-06-07 DIAGNOSIS — E876 Hypokalemia: Secondary | ICD-10-CM | POA: Diagnosis not present

## 2018-06-07 HISTORY — DX: Unspecified atrial fibrillation: I48.91

## 2018-06-07 LAB — CBC
HCT: 50.3 % (ref 40.0–52.0)
Hemoglobin: 16.3 g/dL (ref 13.0–18.0)
MCH: 28.5 pg (ref 26.0–34.0)
MCHC: 32.5 g/dL (ref 32.0–36.0)
MCV: 87.7 fL (ref 80.0–100.0)
PLATELETS: 211 10*3/uL (ref 150–440)
RBC: 5.73 MIL/uL (ref 4.40–5.90)
RDW: 17.3 % — AB (ref 11.5–14.5)
WBC: 12.6 10*3/uL — AB (ref 3.8–10.6)

## 2018-06-07 LAB — BASIC METABOLIC PANEL
Anion gap: 12 (ref 5–15)
BUN: 13 mg/dL (ref 6–20)
CALCIUM: 8.4 mg/dL — AB (ref 8.9–10.3)
CHLORIDE: 99 mmol/L — AB (ref 101–111)
CO2: 28 mmol/L (ref 22–32)
CREATININE: 0.99 mg/dL (ref 0.61–1.24)
Glucose, Bld: 153 mg/dL — ABNORMAL HIGH (ref 65–99)
Potassium: 2.8 mmol/L — ABNORMAL LOW (ref 3.5–5.1)
SODIUM: 139 mmol/L (ref 135–145)

## 2018-06-07 LAB — TROPONIN I
TROPONIN I: 0.04 ng/mL — AB (ref <0.03)
TROPONIN I: 0.06 ng/mL — AB (ref <0.03)
TROPONIN I: 0.07 ng/mL — AB (ref <0.03)

## 2018-06-07 LAB — MAGNESIUM: MAGNESIUM: 2 mg/dL (ref 1.7–2.4)

## 2018-06-07 MED ORDER — NITROGLYCERIN 0.4 MG SL SUBL: 0.4000 mg | SUBLINGUAL_TABLET | SUBLINGUAL | Status: DC | PRN

## 2018-06-07 MED ORDER — AMITRIPTYLINE HCL 10 MG PO TABS
15.0000 mg | ORAL_TABLET | Freq: Every day | ORAL | Status: DC
  Administered 2018-06-07: 15 mg via ORAL
  Filled 2018-06-07 (×2): qty 1.5

## 2018-06-07 MED ORDER — BUSPIRONE HCL 10 MG PO TABS
10.0000 mg | ORAL_TABLET | Freq: Two times a day (BID) | ORAL | Status: DC
  Administered 2018-06-07 – 2018-06-08 (×2): 10 mg via ORAL
  Filled 2018-06-07 (×3): qty 1

## 2018-06-07 MED ORDER — ONDANSETRON HCL 4 MG/2ML IJ SOLN
4.0000 mg | Freq: Once | INTRAMUSCULAR | Status: AC
  Administered 2018-06-07: 4 mg via INTRAVENOUS

## 2018-06-07 MED ORDER — ONDANSETRON HCL 4 MG/2ML IJ SOLN
INTRAMUSCULAR | Status: AC
  Administered 2018-06-07: 4 mg via INTRAVENOUS
  Filled 2018-06-07: qty 2

## 2018-06-07 MED ORDER — SODIUM CHLORIDE 0.9 % IV BOLUS
250.0000 mL | Freq: Once | INTRAVENOUS | Status: AC
  Administered 2018-06-07: 250 mL via INTRAVENOUS

## 2018-06-07 MED ORDER — PANTOPRAZOLE SODIUM 40 MG PO TBEC
40.0000 mg | DELAYED_RELEASE_TABLET | Freq: Every day | ORAL | Status: DC
Start: 2018-06-07 — End: 2018-06-08
  Administered 2018-06-07 – 2018-06-08 (×2): 40 mg via ORAL
  Filled 2018-06-07 (×2): qty 1

## 2018-06-07 MED ORDER — ACETAMINOPHEN 500 MG PO TABS
1000.0000 mg | ORAL_TABLET | Freq: Once | ORAL | Status: AC
  Administered 2018-06-07: 1000 mg via ORAL

## 2018-06-07 MED ORDER — DILTIAZEM HCL 25 MG/5ML IV SOLN
20.0000 mg | Freq: Once | INTRAVENOUS | Status: DC
  Filled 2018-06-07 (×2): qty 5

## 2018-06-07 MED ORDER — ACETAMINOPHEN 500 MG PO TABS
ORAL_TABLET | ORAL | Status: AC
  Administered 2018-06-07: 1000 mg via ORAL
  Filled 2018-06-07: qty 2

## 2018-06-07 MED ORDER — ASPIRIN EC 81 MG PO TBEC
81.0000 mg | DELAYED_RELEASE_TABLET | Freq: Every day | ORAL | Status: DC
  Filled 2018-06-07: qty 1

## 2018-06-07 MED ORDER — ACETAMINOPHEN 325 MG PO TABS
650.0000 mg | ORAL_TABLET | Freq: Four times a day (QID) | ORAL | Status: DC | PRN
  Administered 2018-06-07 – 2018-06-08 (×2): 650 mg via ORAL
  Filled 2018-06-07 (×2): qty 2

## 2018-06-07 MED ORDER — ASPIRIN 81 MG PO CHEW: 324.0000 mg | CHEWABLE_TABLET | ORAL | Status: DC

## 2018-06-07 MED ORDER — SODIUM CHLORIDE 0.9 % IV SOLN: 250.0000 mL | INTRAVENOUS | Status: DC | PRN

## 2018-06-07 MED ORDER — TRAZODONE HCL 50 MG PO TABS
50.0000 mg | ORAL_TABLET | Freq: Every evening | ORAL | Status: DC | PRN
  Administered 2018-06-07: 50 mg via ORAL
  Filled 2018-06-07: qty 1

## 2018-06-07 MED ORDER — ONDANSETRON HCL 4 MG/2ML IJ SOLN: 4.0000 mg | Freq: Four times a day (QID) | INTRAMUSCULAR | Status: DC | PRN

## 2018-06-07 MED ORDER — LISINOPRIL 10 MG PO TABS
20.0000 mg | ORAL_TABLET | Freq: Every day | ORAL | Status: DC
  Filled 2018-06-07: qty 2

## 2018-06-07 MED ORDER — SENNA 8.6 MG PO TABS: 1.0000 | ORAL_TABLET | Freq: Every day | ORAL | Status: DC | PRN

## 2018-06-07 MED ORDER — DILTIAZEM HCL 25 MG/5ML IV SOLN
10.0000 mg | Freq: Once | INTRAVENOUS | Status: AC
  Administered 2018-06-07: 10 mg via INTRAVENOUS
  Filled 2018-06-07: qty 5

## 2018-06-07 MED ORDER — SODIUM CHLORIDE 0.9% FLUSH
3.0000 mL | Freq: Two times a day (BID) | INTRAVENOUS | Status: DC
  Administered 2018-06-07 – 2018-06-08 (×2): 3 mL via INTRAVENOUS

## 2018-06-07 MED ORDER — POTASSIUM CHLORIDE CRYS ER 10 MEQ PO TBCR
10.0000 meq | EXTENDED_RELEASE_TABLET | Freq: Every day | ORAL | Status: DC
  Administered 2018-06-07 – 2018-06-08 (×2): 10 meq via ORAL
  Filled 2018-06-07 (×2): qty 1

## 2018-06-07 MED ORDER — ASPIRIN 300 MG RE SUPP: 300.0000 mg | RECTAL | Status: DC

## 2018-06-07 MED ORDER — POTASSIUM CHLORIDE CRYS ER 20 MEQ PO TBCR
40.0000 meq | EXTENDED_RELEASE_TABLET | Freq: Once | ORAL | Status: AC
  Administered 2018-06-07: 40 meq via ORAL
  Filled 2018-06-07: qty 2

## 2018-06-07 MED ORDER — ISOSORBIDE MONONITRATE ER 60 MG PO TB24
60.0000 mg | ORAL_TABLET | Freq: Every day | ORAL | Status: DC
  Administered 2018-06-07 – 2018-06-08 (×2): 60 mg via ORAL
  Filled 2018-06-07 (×2): qty 1

## 2018-06-07 MED ORDER — SODIUM CHLORIDE 0.9% FLUSH: 3.0000 mL | INTRAVENOUS | Status: DC | PRN

## 2018-06-07 MED ORDER — FLUTICASONE PROPIONATE 50 MCG/ACT NA SUSP
2.0000 | Freq: Every day | NASAL | Status: DC
  Filled 2018-06-07: qty 16

## 2018-06-07 MED ORDER — ATORVASTATIN CALCIUM 20 MG PO TABS
80.0000 mg | ORAL_TABLET | Freq: Every day | ORAL | Status: DC
  Administered 2018-06-07: 80 mg via ORAL
  Filled 2018-06-07: qty 4

## 2018-06-07 MED ORDER — GABAPENTIN 100 MG PO CAPS
100.0000 mg | ORAL_CAPSULE | Freq: Every day | ORAL | Status: DC
  Administered 2018-06-07: 100 mg via ORAL
  Filled 2018-06-07: qty 1

## 2018-06-07 MED ORDER — DABIGATRAN ETEXILATE MESYLATE 150 MG PO CAPS
150.0000 mg | ORAL_CAPSULE | Freq: Two times a day (BID) | ORAL | Status: DC
  Administered 2018-06-07 – 2018-06-08 (×3): 150 mg via ORAL
  Filled 2018-06-07 (×5): qty 1

## 2018-06-07 MED ORDER — IPRATROPIUM-ALBUTEROL 0.5-2.5 (3) MG/3ML IN SOLN: 3.0000 mL | Freq: Four times a day (QID) | RESPIRATORY_TRACT | Status: DC | PRN

## 2018-06-07 MED ORDER — PANTOPRAZOLE SODIUM 40 MG PO TBEC: 40.0000 mg | DELAYED_RELEASE_TABLET | Freq: Every day | ORAL | Status: DC

## 2018-06-07 MED ORDER — METOPROLOL TARTRATE 50 MG PO TABS
100.0000 mg | ORAL_TABLET | Freq: Two times a day (BID) | ORAL | Status: DC
  Administered 2018-06-07 – 2018-06-08 (×2): 100 mg via ORAL
  Filled 2018-06-07 (×3): qty 2

## 2018-06-07 MED ORDER — MOMETASONE FURO-FORMOTEROL FUM 200-5 MCG/ACT IN AERO
2.0000 | INHALATION_SPRAY | Freq: Two times a day (BID) | RESPIRATORY_TRACT | Status: DC
  Administered 2018-06-07 – 2018-06-08 (×2): 2 via RESPIRATORY_TRACT
  Filled 2018-06-07: qty 8.8

## 2018-06-07 NOTE — ED Notes (Signed)
Admitting MD at bedside.

## 2018-06-07 NOTE — H&P (Signed)
[facility] at [place] NAME: Darius Norris    MR#:  [id number]  DATE OF BIRTH:  10/29/1951  DATE OF ADMISSION:  06/07/2018  PRIMARY CARE PHYSICIAN: [facility] [place]   REQUESTING/REFERRING PHYSICIAN:   CHIEF COMPLAINT:   Chief Complaint  Patient presents with  . Chest Pain    HISTORY OF PRESENT ILLNESS: Darius Norris  is a 67 y.o. male with a known history of atrial fibrillation, coronary artery disease, emphysema, GERD, myocardial infarction who follows up at [facility] presented to the emergency room for chest discomfort and dizziness.  Patient felt weak and diaphoretic felt as if he was about to pass out.  When he came to the emergency room his heart rate was around 140 bpm and in atrial fibrillation.  After he had a bowel movement he felt better converted back to sinus rhythm around rate of 70 bpm.  Patient still is feeling weak and diaphoretic.  Recently has been worked up and discharged from our hospital.  Patient follows up with [facility] clinic cardiology.  PAST MEDICAL HISTORY:   Past Medical History:  Diagnosis Date  . A-fib ([place])   . CAD (coronary artery disease)   . Colitis   . COPD (chronic obstructive pulmonary disease) ([place])   . Emphysema lung ([place])   . GERD (gastroesophageal reflux disease)   . Hypertension   . MI (myocardial infarction) ([place]) 1999    PAST SURGICAL HISTORY:  Past Surgical History:  Procedure Laterality Date  . ABDOMINAL HERNIA REPAIR Bilateral   . CHOLECYSTECTOMY    . COLONOSCOPY WITH PROPOFOL N/A 02/01/2018   Procedure: COLONOSCOPY WITH PROPOFOL;  Surgeon: Jonathon Bellows, MD;  Location: [facility] ENDOSCOPY;  Service: Gastroenterology;  Laterality: N/A;  . KNEE ARTHROSCOPY Bilateral     SOCIAL HISTORY:  Social History   Tobacco Use  . Smoking status: Former Research scientist (life sciences)  . Smokeless tobacco: Never Used  Substance Use Topics  . Alcohol use: No    FAMILY HISTORY:  Family History   Problem Relation Age of Onset  . CAD Father   . Stroke Father   . CAD Sister   . Stroke Brother     DRUG ALLERGIES:  Allergies  Allergen Reactions  . Zantac [Ranitidine Hcl] Shortness Of Breath  . Codeine Itching  . Erythromycin Hives  . Morphine And Related Itching  . Vicodin [Hydrocodone-Acetaminophen] Rash    REVIEW OF SYSTEMS:   CONSTITUTIONAL: No fever, fatigue or weakness.  EYES: No blurred or double vision.  EARS, NOSE, AND THROAT: No tinnitus or ear pain.  RESPIRATORY: No cough, shortness of breath, wheezing or hemoptysis.  CARDIOVASCULAR: Has chest pain, no orthopnea, edema.  GASTROINTESTINAL: No nausea, vomiting, diarrhea or abdominal pain.  GENITOURINARY: No dysuria, hematuria.  ENDOCRINE: No polyuria, nocturia,  HEMATOLOGY: No anemia, easy bruising or bleeding SKIN: No rash or lesion. MUSCULOSKELETAL: No joint pain or arthritis.   NEUROLOGIC: No tingling, numbness, weakness.  PSYCHIATRY: No anxiety or depression.   MEDICATIONS AT HOME:  Prior to Admission medications   Medication Sig Start Date End Date Taking? Authorizing Provider  acetaminophen (TYLENOL) 325 MG tablet Take 650 mg by mouth every 6 (six) hours as needed.    Yes [provider]  amitriptyline (ELAVIL) 10 MG tablet Take 15 mg by mouth at bedtime.    Yes [provider]  atorvastatin (LIPITOR) 80 MG tablet Take 1 tablet (80 mg total) by mouth at bedtime. 11/18/17  Yes Vaughan Basta,  MD  budesonide-formoterol (SYMBICORT) 160-4.5 MCG/ACT inhaler Inhale 2 puffs into the lungs 2 (two) times daily.   Yes [provider]  busPIRone (BUSPAR) 10 MG tablet Take 10 mg by mouth 2 (two) times daily.   Yes [provider]  clobetasol cream (TEMOVATE) 3.55 % Apply 1 application topically daily as needed. For itching on psoriasis areas   Yes [provider]  dabigatran (PRADAXA) 150 MG CAPS capsule Take 150 mg by mouth 2 (two) times daily.   Yes [provider]  fluticasone (FLONASE) 50 MCG/ACT nasal spray Place 2 sprays into both nostrils daily.   Yes [provider]  gabapentin (NEURONTIN) 100 MG capsule Take 100 mg by mouth at bedtime.   Yes [provider]  Ipratropium-Albuterol (COMBIVENT RESPIMAT) 20-100 MCG/ACT AERS respimat Inhale 1 puff into the lungs every 6 (six) hours as needed for wheezing or shortness of breath.   Yes [provider]  isosorbide mononitrate (IMDUR) 60 MG 24 hr tablet Take 60 mg by mouth daily.   Yes [provider]  lisinopril (PRINIVIL,ZESTRIL) 40 MG tablet Take 20 mg by mouth daily.    Yes [provider]  loratadine (CLARITIN) 10 MG tablet Take 10 mg by mouth daily as needed for allergies.    Yes [provider]  metoprolol tartrate (LOPRESSOR) 100 MG tablet Take 1 tablet (100 mg total) by mouth 2 (two) times daily. 05/04/18  Yes Fritzi Mandes, MD  omeprazole (PRILOSEC) 20 MG capsule Take 20 mg by mouth daily.    Yes [provider]  potassium chloride (K-DUR,KLOR-CON) 10 MEQ tablet Take 10 mEq by mouth daily.   Yes [provider]  senna (SENOKOT) 8.6 MG TABS tablet Take 1 tablet by mouth daily as needed for mild constipation.   Yes [provider]  traZODone (DESYREL) 50 MG tablet Take 1 tablet (50 mg total) by mouth at bedtime as needed for sleep. 05/29/18  Yes [name], Montell D, MD      PHYSICAL EXAMINATION:   VITAL SIGNS: Blood pressure (!) 99/57, pulse 63, temperature 98.2 F (36.8 C), temperature source Oral, resp. rate 17, height 5\' 6"  (1.676 m), weight 90.7 kg (200 lb), SpO2 97 %.  GENERAL:  67 y.o.-year-old patient lying in the bed with no acute distress.  EYES: Pupils equal, round, reactive to light and accommodation. No scleral icterus. Extraocular muscles intact.  HEENT: Head atraumatic, normocephalic. Oropharynx and nasopharynx clear.  NECK:  Supple, no jugular venous distention. No thyroid enlargement, no  tenderness.  LUNGS: Normal breath sounds bilaterally, no wheezing, rales,rhonchi or crepitation. No use of accessory muscles of respiration.  CARDIOVASCULAR: S1, S2 normal. No murmurs, rubs, or gallops.  ABDOMEN: Soft, nontender, nondistended. Bowel sounds present. No organomegaly or mass.  EXTREMITIES: No pedal edema, cyanosis, or clubbing.  NEUROLOGIC: Cranial nerves II through XII are intact. Muscle strength 5/5 in all extremities. Sensation intact. Gait not checked.  PSYCHIATRIC: The patient is alert and oriented x 3.  SKIN: No obvious rash, lesion, or ulcer.   LABORATORY PANEL:   CBC Recent Labs  Lab 06/07/18 1118  WBC 12.6*  HGB 16.3  HCT 50.3  PLT 211  MCV 87.7  MCH 28.5  MCHC 32.5  RDW 17.3*   ------------------------------------------------------------------------------------------------------------------  Chemistries  Recent Labs  Lab 06/07/18 1118  NA 139  K 2.8*  CL 99*  CO2 28  GLUCOSE 153*  BUN 13  CREATININE 0.99  CALCIUM 8.4*  MG 2.0   ------------------------------------------------------------------------------------------------------------------ estimated creatinine  clearance is 77.4 mL/min (by C-G formula based on SCr of 0.99 mg/dL). ------------------------------------------------------------------------------------------------------------------ No results for input(s): TSH, T4TOTAL, T3FREE, THYROIDAB in the last 72 hours.  Invalid input(s): FREET3   Coagulation profile No results for input(s): INR, PROTIME in the last 168 hours. ------------------------------------------------------------------------------------------------------------------- No results for input(s): DDIMER in the last 72 hours. -------------------------------------------------------------------------------------------------------------------  Cardiac Enzymes Recent Labs  Lab 06/07/18 1118 06/07/18 1404  TROPONINI 0.04* 0.06*    ------------------------------------------------------------------------------------------------------------------ Invalid input(s): POCBNP  ---------------------------------------------------------------------------------------------------------------  Urinalysis    Component Value Date/Time   COLORURINE YELLOW (A) 12/10/2017 2248   APPEARANCEUR CLEAR (A) 12/10/2017 2248   APPEARANCEUR Clear 02/04/2013 1430   LABSPEC 1.009 12/10/2017 2248   LABSPEC 1.004 02/04/2013 1430   PHURINE 6.0 12/10/2017 2248   GLUCOSEU NEGATIVE 12/10/2017 2248   GLUCOSEU Negative 02/04/2013 1430   HGBUR NEGATIVE 12/10/2017 2248   BILIRUBINUR NEGATIVE 12/10/2017 2248   BILIRUBINUR Negative 02/04/2013 1430   KETONESUR NEGATIVE 12/10/2017 2248   PROTEINUR NEGATIVE 12/10/2017 2248   NITRITE NEGATIVE 12/10/2017 2248   LEUKOCYTESUR NEGATIVE 12/10/2017 2248   LEUKOCYTESUR Negative 02/04/2013 1430     RADIOLOGY: Dg Chest Port 1 View  Result Date: 06/07/2018 CLINICAL DATA:  Tachycardia EXAM: PORTABLE CHEST 1 VIEW COMPARISON:  Portable exam 1102 hours compared to 05/28/2018 FINDINGS: Normal heart size, mediastinal contours, and pulmonary vascularity. Chronic bibasilar opacities not significantly changed, question chronic interstitial disease/fibrosis. Atherosclerotic calcification aortic arch. Emphysematous changes in upper lobes. No definite acute superimposed infiltrate, pleural effusion or pneumothorax. IMPRESSION: Chronic bibasilar opacities question fibrosis. Emphysematous changes without definite acute infiltrate. Electronically Signed   By: Lavonia Dana M.D.   On: 06/07/2018 11:26    EKG: Orders placed or performed during the hospital encounter of 06/07/18  . ED EKG  . ED EKG  . EKG 12-Lead  . EKG 12-Lead  . EKG 12-Lead  . EKG 12-Lead  . EKG 12-Lead  . EKG 12-Lead    IMPRESSION AND PLAN:  67 year old elderly male patient with history of COPD, atrial fibrillation, GERD, coronary artery disease  presented to the emergency room for chest discomfort and dizziness  -Atypical chest pain Cycle troponin Telemetry monitoring Cardiology consultation  -Atrial fibrillation paroxysmal Continue metoprolol for rate control Continue Pradaxa for anticoagulation   - CAD Continue imdur, ace inhibitor and betablocker  - COPD stable  -DVT prophylaxis On anticoagulation with pradaxa  All the records are reviewed and case discussed with ED provider. Management plans discussed with the patient, family and they are in agreement.  CODE STATUS:Full code Code Status History    Date Active Date Inactive Code Status Order ID Comments User Context   05/28/2018 0623 05/29/2018 1512 Full Code [id number]  Arta Silence, MD ED   05/02/2018 1239 05/04/2018 1603 Full Code [id number]  Gorden Harms, MD Inpatient   11/17/2017 0837 11/18/2017 1635 Full Code [id number]  Harrie Foreman, MD Inpatient   09/04/2016 1959 09/05/2016 1736 Full Code [id number]  Theodoro Grist, MD Inpatient   12/15/2015 0158 12/15/2015 1922 Full Code [id number]  Lance Coon, MD Inpatient       TOTAL TIME TAKING CARE OF THIS PATIENT: 51 minutes.    Saundra Shelling M.D on 06/07/2018 at 4:38 PM  Between 7am to 6pm - Pager - [phone]  After 6pm go to [email or web address] - password [email or web address] [facility]  Office  [phone]  CC: Primary care physician; [facility] [place]

## 2018-06-07 NOTE — Progress Notes (Signed)
Advanced care plan. Purpose of the Encounter: CODE STATUS Parties in Attendance: Patient and family Patient's Decision Capacity: Good  Subjective/Patient's story: Presented to ER with Diaphoresis, weakness and about to pass out Objective/Medical story Had atrial fibrillation with rapid rate Goals of care determination:  Advance care directives and goals of care discussed with the patient in detail For now patient wants everything done which includes resuscitation, intubation CODE STATUS: Full code Time spent discussing advanced care planning: 16 minutes

## 2018-06-07 NOTE — ED Notes (Signed)
Pt states feeling nauseous and needs to have a bowel movement, assisted to toilet, pt became pale in color.  HR decreased to 80s on monitor but states feeling better after bowel movement, EKG repeated per Dr. [place] Pepper.

## 2018-06-07 NOTE — ED Notes (Signed)
Pt sitting on side of bed with wife, states shaking, feeling dizzy and nauseous, pale in color, and states feeling weak.  MD called to bedside.

## 2018-06-07 NOTE — ED Notes (Signed)
Date and time results received: 06/07/18 12:18 PM (use smartphrase ".now" to insert current time)  Test: Troponin Critical Value: 0.04  Name of Provider Notified: Dr. Jimmye Norman  Orders Received? Or Actions Taken?: No new orders at this time.

## 2018-06-07 NOTE — ED Triage Notes (Signed)
Ems pt from home , afib, acid reflux , chest pain thiks week , seen at [facility] yesterday increased meds for GERD. Pt on home o2 @ 2 liters .  EMS administered 5mg   Lopressor IV PTA .  HR 160's to 200's , after lopressor HR decreased to 120 to 150's.  18gauge  Left AC

## 2018-06-07 NOTE — ED Provider Notes (Addendum)
[facility] Emergency Department Provider Note       Time seen: ----------------------------------------- 11:20 AM on 06/07/2018 -----------------------------------------   I have reviewed the triage vital signs and the nursing notes.  HISTORY   Chief Complaint Chest Pain    HPI Darius Norris is a 67 y.o. male with a history of atrial for ablation, coronary disease, COPD, GERD, hypertension, MI who presents to the ED for rapid heartbeat as well as symptoms of GERD.  Patient has had chest pain this week.  He was seen at the [facility] yesterday where they increased medication for GERD.  Patient has had 5 mg of Lopressor given prior to arrival, he still arrives with a rapid heartbeat and having some chest pain.  He denies fevers, chills or other complaints.  Past Medical History:  Diagnosis Date  . A-fib ([place])   . CAD (coronary artery disease)   . Colitis   . COPD (chronic obstructive pulmonary disease) ([place])   . Emphysema lung ([place])   . GERD (gastroesophageal reflux disease)   . Hypertension   . MI (myocardial infarction) [facility]) 1999    Patient Active Problem List   Diagnosis Date Noted  . Acute hypoxemic respiratory failure ([place]) 05/28/2018  . A-fib ([place]) 05/02/2018  . Chest pain 11/17/2017  . Unstable angina ([place]) 09/04/2016  . Uncontrolled hypertension 09/04/2016  . Hypokalemia 09/04/2016  . Leukocytosis 09/04/2016  . Hyperglycemia 09/04/2016  . COPD exacerbation ([place]) 12/15/2015  . Elevated troponin 12/15/2015  . HTN (hypertension) 12/15/2015  . GERD (gastroesophageal reflux disease) 12/15/2015  . CAD (coronary artery disease) 12/15/2015  . Angina pectoris ([place]) 12/15/2015    Past Surgical History:  Procedure Laterality Date  . ABDOMINAL HERNIA REPAIR Bilateral   . CHOLECYSTECTOMY    . COLONOSCOPY WITH PROPOFOL N/A 02/01/2018   Procedure: COLONOSCOPY WITH PROPOFOL;  Surgeon: Jonathon Bellows, MD;  Location: [facility] ENDOSCOPY;  Service:  Gastroenterology;  Laterality: N/A;  . KNEE ARTHROSCOPY Bilateral     Allergies Zantac [ranitidine hcl]; Codeine; Erythromycin; Morphine and related; and Vicodin [hydrocodone-acetaminophen]  Social History Social History   Tobacco Use  . Smoking status: Former Research scientist (life sciences)  . Smokeless tobacco: Never Used  Substance Use Topics  . Alcohol use: No  . Drug use: No   Review of Systems Constitutional: Negative for fever. Cardiovascular: Positive for chest pain, fast heartbeat Respiratory: Negative for shortness of breath. Gastrointestinal: Negative for abdominal pain, vomiting and diarrhea. Musculoskeletal: Negative for back pain. Skin: Negative for rash. Neurological: Negative for headaches, focal weakness or numbness.  All systems negative/normal/unremarkable except as stated in the HPI  ____________________________________________   PHYSICAL EXAM:  VITAL SIGNS: ED Triage Vitals  Enc Vitals Group     BP 06/07/18 1107 (!) 196/165     Pulse Rate 06/07/18 1107 (!) 156     Resp 06/07/18 1107 (!) 22     Temp 06/07/18 1107 98.2 F (36.8 C)     Temp Source 06/07/18 1107 Oral     SpO2 06/07/18 1107 96 %     Weight 06/07/18 1108 200 lb (90.7 kg)     Height 06/07/18 1108 5\' 6"  (1.676 m)     Head Circumference --      Peak Flow --      Pain Score 06/07/18 1108 5     Pain Loc --      Pain Edu? --      Excl. in [place]? --    Constitutional: Alert and oriented.  Mild distress Eyes:  Conjunctivae are normal. Normal extraocular movements. ENT   Head: Normocephalic and atraumatic.   Nose: No congestion/rhinnorhea.   Mouth/Throat: Mucous membranes are moist.   Neck: No stridor. Cardiovascular: Rapid rate, irregular rhythm. No murmurs, rubs, or gallops. Respiratory: Prolonged expiration, scattered rhonchi Gastrointestinal: Soft and nontender. Normal bowel sounds Musculoskeletal: Nontender with normal range of motion in extremities. No lower extremity tenderness nor  edema. Neurologic:  Normal speech and language. No gross focal neurologic deficits are appreciated.  Skin:  Skin is warm, dry and intact. No rash noted. Psychiatric: Mood and affect are normal. Speech and behavior are normal.  ____________________________________________  EKG: Interpreted by me.  Likely atrial fibrillation with a rapid ventricular response, rate is 152 bpm, normal QRS width, normal QT, normal axis  EKG interpreted by me with sinus rhythm with a rate of 88 bpm, repolarization abnormality, normal axis, normal QT.  This reflects his old EKG. ____________________________________________  ED COURSE:  As part of my medical decision making, I reviewed the following data within the [place] History obtained from family if available, nursing notes, old chart and ekg, as well as notes from prior ED visits. Patient presented for rapid heartbeat, chest pain, we will assess with labs and imaging as indicated at this time. Clinical Course as of Jun 07 1410  Fri Jun 07, 2018  1242 Troponin I(!!): 0.04 [JW]  1242 WBC(!): 12.6 [JW]  1242 Potassium(!): 2.8 [JW]  1408 Patient is in a normal sinus rhythm at this time, blood pressure 110/72   [JW]    Clinical Course User Index [JW] Earleen Newport, MD   Procedures ____________________________________________   LABS (pertinent positives/negatives)  Labs Reviewed  BASIC METABOLIC PANEL - Abnormal; Notable for the following components:      Result Value   Potassium 2.8 (*)    Chloride 99 (*)    Glucose, Bld 153 (*)    Calcium 8.4 (*)    All other components within normal limits  CBC - Abnormal; Notable for the following components:   WBC 12.6 (*)    RDW 17.3 (*)    All other components within normal limits  TROPONIN I - Abnormal; Notable for the following components:   Troponin I 0.04 (*)    All other components within normal limits  TROPONIN I - Abnormal; Notable for the following components:    Troponin I 0.06 (*)    All other components within normal limits  MAGNESIUM   CRITICAL CARE Performed by: Laurence Aly   Total critical care time: 30 minutes  Critical care time was exclusive of separately billable procedures and treating other patients.  Critical care was necessary to treat or prevent imminent or life-threatening deterioration.  Critical care was time spent personally by me on the following activities: development of treatment plan with patient and/or surrogate as well as nursing, discussions with consultants, evaluation of patient's response to treatment, examination of patient, obtaining history from patient or surrogate, ordering and performing treatments and interventions, ordering and review of laboratory studies, ordering and review of radiographic studies, pulse oximetry and re-evaluation of patient's condition.   RADIOLOGY Images were viewed by me  Chest x-ray IMPRESSION: Chronic bibasilar opacities question fibrosis.  Emphysematous changes without definite acute infiltrate. ____________________________________________  DIFFERENTIAL DIAGNOSIS   Dehydration, electrolyte abnormality, medication side effect, MI, unstable angina, PE  FINAL ASSESSMENT AND PLAN  Rapid atrial fibrillation, chest pain, near syncope   Plan: The patient had presented for chest pain and was noted  to have a markedly elevated heart rate. Patient's labs were unremarkable with exception of slightly depressed potassium level and slightly elevated troponin.  Troponin was repeated after 3 hours which did show an increase from 0.04-0.06.  His imaging did not reveal any acute process.  Early on due to his A. fib with RVR he was given IV Cardizem and we restarted his scheduled medications including all of his blood pressure medicine.  He subsequently converted to a sinus rhythm with a normal blood pressure and his symptoms have resolved.  I discussed the case with his cardiologist  who states he has not been seen in follow-up recently.  We were initially on a try to let him go home but he now feels ill again and feels near syncopal.  Blood pressure 95/69.  I will give a small saline bolus and discussed with the hospitalist for admission.   Laurence Aly, MD   Note: This note was generated in part or whole with voice recognition software. Voice recognition is usually quite accurate but there are transcription errors that can and very often do occur. I apologize for any typographical errors that were not detected and corrected.     Earleen Newport, MD 06/07/18 1446    Earleen Newport, MD 06/07/18 1502    Earleen Newport, MD 06/07/18 [phone]

## 2018-06-08 LAB — TROPONIN I
Troponin I: 0.05 ng/mL (ref <0.03)
Troponin I: 0.07 ng/mL

## 2018-06-08 MED ORDER — ASPIRIN 81 MG PO TBEC: 81.0000 mg | DELAYED_RELEASE_TABLET | Freq: Every day | ORAL | 0 refills | Status: DC

## 2018-06-08 MED ORDER — DILTIAZEM HCL ER COATED BEADS 180 MG PO CP24: 180.0000 mg | ORAL_CAPSULE | Freq: Every day | ORAL | 0 refills | Status: DC

## 2018-06-08 MED ORDER — DILTIAZEM HCL ER COATED BEADS 180 MG PO CP24
180.0000 mg | ORAL_CAPSULE | Freq: Every day | ORAL | Status: DC
  Administered 2018-06-08: 180 mg via ORAL
  Filled 2018-06-08: qty 1

## 2018-06-08 MED ORDER — KETOROLAC TROMETHAMINE 30 MG/ML IJ SOLN
30.0000 mg | Freq: Once | INTRAMUSCULAR | Status: AC
  Administered 2018-06-08: 30 mg via INTRAVENOUS
  Filled 2018-06-08: qty 1

## 2018-06-08 NOTE — Care Management Obs Status (Signed)
[facility] NOTIFICATION   Patient Details  Name: TALOR CHEEMA MRN: [id number] Date of Birth: 28-Dec-1950   Medicare Observation Status Notification Given:  Yes    Brallan Denio A Willim Turnage, RN 06/08/2018, 8:48 AM

## 2018-06-08 NOTE — Care Management Note (Signed)
Case Management Note  Patient Details  Name: Darius Norris MRN: [id number] Date of Birth: 22-Oct-1951  Subjective/Objective:              Patient admitted to [facility] under observation status for Chest Pain. RNCM consulted on patient to provide MOON letter and complete assessment. Patient currently lives at home with his wife [name] [phone]. Uses portable oxygen as needed @ 2L through [facility] which is located in [place]. Patient requires no other DME and is able to complete all activities of daily living independently. Pt is enrolled through the [place] for PCP services. No issues obtaining medications.       Action/Plan: RNCM to continue to follow for any needs. Spouse to bring portable tank at discharge.  Expected Discharge Date:                  Expected Discharge Plan:     In-House Referral:     Discharge planning Services     Post Acute Care Choice:    Choice offered to:     DME Arranged:    DME Agency:     HH Arranged:    HH Agency:     Status of Service:     If discussed at [employer], dates discussed:    Additional Comments:  Latanya Maudlin, RN 06/08/2018, 8:48 AM

## 2018-06-08 NOTE — Consult Note (Signed)
[facility] Cardiology  CARDIOLOGY CONSULT NOTE  Patient ID: Darius Norris MRN: [id number] DOB/AGE: September 01, 1951 67 y.o.  Admit date: 06/07/2018 Referring Physician [name] Primary Physician [facility] Primary Cardiologist Mitchelle Sultan Reason for Consultation atrial fibrillation with rapid ventricular rate  HPI: 67 year old gentleman referred for evaluation of atrial fibrillation with rapid ventricular rate.  The patient has known coronary disease, history of non-STEMI approximately 12 years ago treated medically.  He has been lost to follow-up.  Early is being followed at the [facility].  He was recently diagnosed with paroxysmal atrial fibrillation, initially on Eliquis, now on Pradaxa for stroke prevention.  Had a recent exacerbation of COPD, on prednisone taper.  Recurrent episodes of atrial fibrillation, presented to [facility] emergency room yesterday, with tachycardia and atrial fibrillation with a rapid ventricular rate.  Patient was treated with intravenous Lopressor in the emergency room, currently is in sinus rhythm at a rate of 91 bpm.  Admission labs notable for borderline elevated troponin of 0.04, 0.06, 0.07, in the absence of chest pain.  Review of systems complete and found to be negative unless listed above     Past Medical History:  Diagnosis Date  . A-fib ([place])   . CAD (coronary artery disease)   . Colitis   . COPD (chronic obstructive pulmonary disease) ([place])   . Emphysema lung ([place])   . GERD (gastroesophageal reflux disease)   . Hypertension   . MI (myocardial infarction) ([place]) 1999    Past Surgical History:  Procedure Laterality Date  . ABDOMINAL HERNIA REPAIR Bilateral   . CHOLECYSTECTOMY    . COLONOSCOPY WITH PROPOFOL N/A 02/01/2018   Procedure: COLONOSCOPY WITH PROPOFOL;  Surgeon: Jonathon Bellows, MD;  Location: [facility] ENDOSCOPY;  Service: Gastroenterology;  Laterality: N/A;  . KNEE ARTHROSCOPY Bilateral     Medications Prior to Admission  Medication Sig Dispense Refill Last Dose  .  acetaminophen (TYLENOL) 325 MG tablet Take 650 mg by mouth every 6 (six) hours as needed.    prn at prn  . amitriptyline (ELAVIL) 10 MG tablet Take 15 mg by mouth at bedtime.    05/27/2018 at Unknown time  . atorvastatin (LIPITOR) 80 MG tablet Take 1 tablet (80 mg total) by mouth at bedtime. 30 tablet 0 05/27/2018 at Unknown time  . budesonide-formoterol (SYMBICORT) 160-4.5 MCG/ACT inhaler Inhale 2 puffs into the lungs 2 (two) times daily.   05/27/2018 at Unknown time  . busPIRone (BUSPAR) 10 MG tablet Take 10 mg by mouth 2 (two) times daily.   05/27/2018 at Unknown time  . clobetasol cream (TEMOVATE) 0.01 % Apply 1 application topically daily as needed. For itching on psoriasis areas   prn at prn  . dabigatran (PRADAXA) 150 MG CAPS capsule Take 150 mg by mouth 2 (two) times daily.   05/27/2018 at Unknown time  . fluticasone (FLONASE) 50 MCG/ACT nasal spray Place 2 sprays into both nostrils daily.   05/27/2018 at Unknown time  . gabapentin (NEURONTIN) 100 MG capsule Take 100 mg by mouth at bedtime.   05/27/2018 at Unknown time  . Ipratropium-Albuterol (COMBIVENT RESPIMAT) 20-100 MCG/ACT AERS respimat Inhale 1 puff into the lungs every 6 (six) hours as needed for wheezing or shortness of breath.   05/27/2018 at Unknown time  . isosorbide mononitrate (IMDUR) 60 MG 24 hr tablet Take 60 mg by mouth daily.   05/27/2018 at Unknown time  . lisinopril (PRINIVIL,ZESTRIL) 40 MG tablet Take 20 mg by mouth daily.    05/27/2018 at Unknown time  . loratadine (  CLARITIN) 10 MG tablet Take 10 mg by mouth daily as needed for allergies.    prn at prn  . metoprolol tartrate (LOPRESSOR) 100 MG tablet Take 1 tablet (100 mg total) by mouth 2 (two) times daily. 60 tablet 0 05/27/2018 at Unknown time  . omeprazole (PRILOSEC) 20 MG capsule Take 20 mg by mouth daily.    05/27/2018 at Unknown time  . potassium chloride (K-DUR,KLOR-CON) 10 MEQ tablet Take 10 mEq by mouth daily.   05/27/2018 at Unknown time  . senna (SENOKOT) 8.6 MG TABS  tablet Take 1 tablet by mouth daily as needed for mild constipation.   prn at prn  . traZODone (DESYREL) 50 MG tablet Take 1 tablet (50 mg total) by mouth at bedtime as needed for sleep. 30 tablet 0    Social History   Socioeconomic History  . Marital status: Married    Spouse name: Not on file  . Number of children: Not on file  . Years of education: Not on file  . Highest education level: Not on file  Occupational History  . Not on file  Social Needs  . Financial resource strain: Not on file  . Food insecurity:    Worry: Not on file    Inability: Not on file  . Transportation needs:    Medical: Not on file    Non-medical: Not on file  Tobacco Use  . Smoking status: Former Research scientist (life sciences)  . Smokeless tobacco: Never Used  Substance and Sexual Activity  . Alcohol use: No  . Drug use: No  . Sexual activity: Never  Lifestyle  . Physical activity:    Days per week: Not on file    Minutes per session: Not on file  . Stress: Not on file  Relationships  . Social connections:    Talks on phone: Not on file    Gets together: Not on file    Attends religious service: Not on file    Active member of club or organization: Not on file    Attends meetings of clubs or organizations: Not on file    Relationship status: Not on file  . Intimate partner violence:    Fear of current or ex partner: Not on file    Emotionally abused: Not on file    Physically abused: Not on file    Forced sexual activity: Not on file  Other Topics Concern  . Not on file  Social History Narrative  . Not on file    Family History  Problem Relation Age of Onset  . CAD Father   . Stroke Father   . CAD Sister   . Stroke Brother       Review of systems complete and found to be negative unless listed above      PHYSICAL EXAM  General: Well developed, well nourished, in no acute distress HEENT:  Normocephalic and atramatic Neck:  No JVD.  Lungs: Clear bilaterally to auscultation and  percussion. Heart: HRRR . Normal S1 and S2 without gallops or murmurs.  Abdomen: Bowel sounds are positive, abdomen soft and non-tender  Msk:  Back normal, normal gait. Normal strength and tone for age. Extremities: No clubbing, cyanosis or edema.   Neuro: Alert and oriented X 3. Psych:  Good affect, responds appropriately  Labs:   Lab Results  Component Value Date   WBC 12.6 (H) 06/07/2018   HGB 16.3 06/07/2018   HCT 50.3 06/07/2018   MCV 87.7 06/07/2018   PLT 211 06/07/2018  Recent Labs  Lab 06/07/18 1118  NA 139  K 2.8*  CL 99*  CO2 28  BUN 13  CREATININE 0.99  CALCIUM 8.4*  GLUCOSE 153*   Lab Results  Component Value Date   CKTOTAL 74 02/05/2013   CKMB 0.9 02/05/2013   TROPONINI 0.05 (HH) 06/08/2018    Lab Results  Component Value Date   CHOL 116 09/04/2016   CHOL 185 02/04/2013   Lab Results  Component Value Date   HDL 42 09/04/2016   HDL 38 (L) 02/04/2013   Lab Results  Component Value Date   LDLCALC 51 09/04/2016   LDLCALC 114 (H) 02/04/2013   Lab Results  Component Value Date   TRIG 116 09/04/2016   TRIG 166 02/04/2013   Lab Results  Component Value Date   CHOLHDL 2.8 09/04/2016   No results found for: LDLDIRECT    Radiology: Dg Chest 2 View  Result Date: 05/28/2018 CLINICAL DATA:  Initial evaluation for intermittent chest tightness, increasing shortness of breath. EXAM: CHEST - 2 VIEW COMPARISON:  Prior radiograph from 05/02/2018. FINDINGS: Cardiac and mediastinal silhouettes are stable in size and contour, and remain within normal limits. Lungs are hyperinflated with attenuation of the pulmonary markings superiorly, consistent with emphysema. Bibasilar peribronchial thickening, similar to previous, likely chronic. No other focal infiltrates. No pulmonary edema or pleural effusion. No pneumothorax. No acute osseous abnormality. IMPRESSION: 1. Emphysema. Associated bibasilar peribronchial thickening similar to prior and is likely chronic. 2.  No other active cardiopulmonary disease. Electronically Signed   By: Jeannine Boga M.D.   On: 05/28/2018 04:10   Dg Chest Port 1 View  Result Date: 06/07/2018 CLINICAL DATA:  Tachycardia EXAM: PORTABLE CHEST 1 VIEW COMPARISON:  Portable exam 1102 hours compared to 05/28/2018 FINDINGS: Normal heart size, mediastinal contours, and pulmonary vascularity. Chronic bibasilar opacities not significantly changed, question chronic interstitial disease/fibrosis. Atherosclerotic calcification aortic arch. Emphysematous changes in upper lobes. No definite acute superimposed infiltrate, pleural effusion or pneumothorax. IMPRESSION: Chronic bibasilar opacities question fibrosis. Emphysematous changes without definite acute infiltrate. Electronically Signed   By: Lavonia Dana M.D.   On: 06/07/2018 11:26    EKG: Atrial fibrillation with rapid ventricular rate  ASSESSMENT AND PLAN:   1.  Paroxysmal atrial fibrillation, currently in sinus rhythm, on Pradaxa for stroke prevention 2.  Known CAD, history of non-STEMI treated medically approximately 12 years ago, currently without chest pain 3.  Borderline elevated troponin, in the absence of chest pain, likely demand supply ischemia secondary to atrial fibrillation with rapid ventricular rate  Recommendations  1.  Agree with current therapy 2.  Continue Pradaxa for stroke prevention 3.  Add Cardizem CD 180 mg daily 4.  Continue metoprolol tartrate 100 mg twice daily 5.  If patient does well today, may discharge home, follow-up in 1 week  Signed: Isaias Cowman MD,PhD, [facility] 06/08/2018, 9:15 AM

## 2018-06-08 NOTE — Discharge Summary (Signed)
[place] [facility] NAME: Darius Norris    MR#:  [id number]  DATE OF BIRTH:  [birth year]  DATE OF ADMISSION:  06/07/2018 ADMITTING PHYSICIAN: Saundra Shelling, MD  DATE OF DISCHARGE: No discharge date for patient encounter.  PRIMARY CARE PHYSICIAN: [facility]    ADMISSION DIAGNOSIS:  Atrial fibrillation with rapid ventricular response ([place]) [I48.91] Near syncope [R55] Nonspecific chest pain [R07.9]  DISCHARGE DIAGNOSIS:  Active Problems:   Chest pain   SECONDARY DIAGNOSIS:   Past Medical History:  Diagnosis Date  . A-fib ([place])   . CAD (coronary artery disease)   . Colitis   . COPD (chronic obstructive pulmonary disease) ([place])   . Emphysema lung ([place])   . GERD (gastroesophageal reflux disease)   . Hypertension   . MI (myocardial infarction) [facility]) 1999    HOSPITAL COURSE:  67 year old elderly male patient with history of COPD, atrial fibrillation, GERD, coronary artery disease presented to the emergency room for chest discomfort and dizziness  *Acute Atypical chest pain Secondary to A. fib with RVR Resolved Did rule out for acute coronary syndrome, seen by cardiology-'s started on Cardizem 180 mg p.o. daily, recommended follow-up in 1 to 2 weeks status post discharge  *Acute on chronic paroxysmal A. fib with RVR  Resolved Cardizem added to his regiment of metoprolol and Pradaxa Cardiology did see patient while in house   DISCHARGE CONDITIONS:   stable  CONSULTS OBTAINED:  Treatment Team:  Isaias Cowman, MD  DRUG ALLERGIES:   Allergies  Allergen Reactions  . Zantac [Ranitidine Hcl] Shortness Of Breath  . Codeine Itching  . Erythromycin Hives  . Morphine And Related Itching  . Vicodin [Hydrocodone-Acetaminophen] Rash    DISCHARGE MEDICATIONS:   Allergies as of 06/08/2018      Reactions   Zantac [ranitidine Hcl] Shortness Of Breath   Codeine Itching   Erythromycin Hives    Morphine And Related Itching   Vicodin [hydrocodone-acetaminophen] Rash      Medication List    TAKE these medications   acetaminophen 325 MG tablet Commonly known as:  TYLENOL Take 650 mg by mouth every 6 (six) hours as needed.   amitriptyline 10 MG tablet Commonly known as:  ELAVIL Take 15 mg by mouth at bedtime.   aspirin 81 MG EC tablet Take 1 tablet (81 mg total) by mouth daily. Start taking on:  06/09/2018   atorvastatin 80 MG tablet Commonly known as:  LIPITOR Take 1 tablet (80 mg total) by mouth at bedtime.   budesonide-formoterol 160-4.5 MCG/ACT inhaler Commonly known as:  SYMBICORT Inhale 2 puffs into the lungs 2 (two) times daily.   busPIRone 10 MG tablet Commonly known as:  BUSPAR Take 10 mg by mouth 2 (two) times daily.   clobetasol cream 0.05 % Commonly known as:  TEMOVATE Apply 1 application topically daily as needed. For itching on psoriasis areas   COMBIVENT RESPIMAT 20-100 MCG/ACT Aers respimat Generic drug:  Ipratropium-Albuterol Inhale 1 puff into the lungs every 6 (six) hours as needed for wheezing or shortness of breath.   dabigatran 150 MG Caps capsule Commonly known as:  PRADAXA Take 150 mg by mouth 2 (two) times daily.   diltiazem 180 MG 24 hr capsule Commonly known as:  CARDIZEM CD Take 1 capsule (180 mg total) by mouth daily. Start taking on:  06/09/2018   fluticasone 50 MCG/ACT nasal spray Commonly known as:  FLONASE Place 2 sprays into both nostrils daily.  gabapentin 100 MG capsule Commonly known as:  NEURONTIN Take 100 mg by mouth at bedtime.   isosorbide mononitrate 60 MG 24 hr tablet Commonly known as:  IMDUR Take 60 mg by mouth daily.   lisinopril 40 MG tablet Commonly known as:  PRINIVIL,ZESTRIL Take 20 mg by mouth daily.   loratadine 10 MG tablet Commonly known as:  CLARITIN Take 10 mg by mouth daily as needed for allergies.   metoprolol tartrate 100 MG tablet Commonly known as:  LOPRESSOR Take 1 tablet (100 mg  total) by mouth 2 (two) times daily.   omeprazole 20 MG capsule Commonly known as:  PRILOSEC Take 20 mg by mouth daily.   potassium chloride 10 MEQ tablet Commonly known as:  K-DUR,KLOR-CON Take 10 mEq by mouth daily.   senna 8.6 MG Tabs tablet Commonly known as:  SENOKOT Take 1 tablet by mouth daily as needed for mild constipation.   traZODone 50 MG tablet Commonly known as:  DESYREL Take 1 tablet (50 mg total) by mouth at bedtime as needed for sleep.        DISCHARGE INSTRUCTIONS:  If you experience worsening of your admission symptoms, develop shortness of breath, life threatening emergency, suicidal or homicidal thoughts you must seek medical attention immediately by calling 911 or calling your MD immediately  if symptoms less severe.  You Must read complete instructions/literature along with all the possible adverse reactions/side effects for all the Medicines you take and that have been prescribed to you. Take any new Medicines after you have completely understood and accept all the possible adverse reactions/side effects.   Please note  You were cared for by a hospitalist during your hospital stay. If you have any questions about your discharge medications or the care you received while you were in the hospital after you are discharged, you can call the unit and asked to speak with the hospitalist on call if the hospitalist that took care of you is not available. Once you are discharged, your primary care physician will handle any further medical issues. Please note that NO REFILLS for any discharge medications will be authorized once you are discharged, as it is imperative that you return to your primary care physician (or establish a relationship with a primary care physician if you do not have one) for your aftercare needs so that they can reassess your need for medications and monitor your lab values.    Today   CHIEF COMPLAINT:   Chief Complaint  Patient presents  with  . Chest Pain    HISTORY OF PRESENT ILLNESS:  67 y.o. male with a known history of atrial fibrillation, coronary artery disease, emphysema, GERD, myocardial infarction who follows up at [facility] presented to the emergency room for chest discomfort and dizziness.  Patient felt weak and diaphoretic felt as if he was about to pass out.  When he came to the emergency room his heart rate was around 140 bpm and in atrial fibrillation.  After he had a bowel movement he felt better converted back to sinus rhythm around rate of 70 bpm.  Patient still is feeling weak and diaphoretic.  Recently has been worked up and discharged from our hospital.  Patient follows up with [facility] cardiology. VITAL SIGNS:  Blood pressure 122/65, pulse 96, temperature 98.3 F (36.8 C), temperature source Oral, resp. rate 18, height 5\' 6"  (1.676 m), weight 92.8 kg (204 lb 9.6 oz), SpO2 92 %.  I/O:    Intake/Output Summary (Last  24 hours) at 06/08/2018 1111 Last data filed at 06/08/2018 0825 Gross per 24 hour  Intake -  Output 200 ml  Net -200 ml    PHYSICAL EXAMINATION:  GENERAL:  67 y.o.-year-old patient lying in the bed with no acute distress.  EYES: Pupils equal, round, reactive to light and accommodation. No scleral icterus. Extraocular muscles intact.  HEENT: Head atraumatic, normocephalic. Oropharynx and nasopharynx clear.  NECK:  Supple, no jugular venous distention. No thyroid enlargement, no tenderness.  LUNGS: Normal breath sounds bilaterally, no wheezing, rales,rhonchi or crepitation. No use of accessory muscles of respiration.  CARDIOVASCULAR: S1, S2 normal. No murmurs, rubs, or gallops.  ABDOMEN: Soft, non-tender, non-distended. Bowel sounds present. No organomegaly or mass.  EXTREMITIES: No pedal edema, cyanosis, or clubbing.  NEUROLOGIC: Cranial nerves II through XII are intact. Muscle strength 5/5 in all extremities. Sensation intact. Gait not checked.  PSYCHIATRIC: The patient is  alert and oriented x 3.  SKIN: No obvious rash, lesion, or ulcer.   DATA REVIEW:   CBC Recent Labs  Lab 06/07/18 1118  WBC 12.6*  HGB 16.3  HCT 50.3  PLT 211    Chemistries  Recent Labs  Lab 06/07/18 1118  NA 139  K 2.8*  CL 99*  CO2 28  GLUCOSE 153*  BUN 13  CREATININE 0.99  CALCIUM 8.4*  MG 2.0    Cardiac Enzymes Recent Labs  Lab 06/08/18 0641  TROPONINI 0.05*    Microbiology Results  Results for orders placed or performed during the hospital encounter of 09/04/16  Culture, expectorated sputum-assessment     Status: None   Collection Time: 09/05/16  6:35 AM  Result Value Ref Range Status   Specimen Description EXPECTORATED SPUTUM  Final   Special Requests NONE  Final   Sputum evaluation THIS SPECIMEN IS ACCEPTABLE FOR SPUTUM CULTURE  Final   Report Status 09/05/2016 FINAL  Final  Culture, respiratory (NON-Expectorated)     Status: None   Collection Time: 09/05/16  6:35 AM  Result Value Ref Range Status   Specimen Description EXPECTORATED SPUTUM  Final   Special Requests NONE Reflexed from [id number]  Final   Gram Stain   Final    ABUNDANT WBC PRESENT,BOTH PMN AND MONONUCLEAR ABUNDANT GRAM POSITIVE COCCI IN CHAINS IN PAIRS RARE YEAST    Culture   Final    Consistent with normal respiratory flora. Performed at [facility]    Report Status 09/07/2016 FINAL  Final    RADIOLOGY:  Dg Chest Port 1 View  Result Date: 06/07/2018 CLINICAL DATA:  Tachycardia EXAM: PORTABLE CHEST 1 VIEW COMPARISON:  Portable exam 1102 hours compared to 05/28/2018 FINDINGS: Normal heart size, mediastinal contours, and pulmonary vascularity. Chronic bibasilar opacities not significantly changed, question chronic interstitial disease/fibrosis. Atherosclerotic calcification aortic arch. Emphysematous changes in upper lobes. No definite acute superimposed infiltrate, pleural effusion or pneumothorax. IMPRESSION: Chronic bibasilar opacities question fibrosis. Emphysematous  changes without definite acute infiltrate. Electronically Signed   By: Lavonia Dana M.D.   On: 06/07/2018 11:26    EKG:   Orders placed or performed during the hospital encounter of 06/07/18  . ED EKG  . ED EKG  . EKG 12-Lead  . EKG 12-Lead  . EKG 12-Lead  . EKG 12-Lead  . EKG 12-Lead  . EKG 12-Lead      Management plans discussed with the patient, family and they are in agreement.  CODE STATUS:     Code Status Orders  (From admission, onward)  Start     Ordered   06/07/18 1841  Full code  Continuous     06/07/18 1840    Code Status History    Date Active Date Inactive Code Status Order ID Comments User Context   05/28/2018 0623 05/29/2018 1512 Full Code [id number]  Arta Silence, MD ED   05/02/2018 1239 05/04/2018 1603 Full Code [id number]  Briawna Carver, Avel Peace, MD Inpatient   11/17/2017 0837 11/18/2017 1635 Full Code [id number]  Harrie Foreman, MD Inpatient   09/04/2016 1959 09/05/2016 1736 Full Code [id number]  Theodoro Grist, MD Inpatient   12/15/2015 0158 12/15/2015 1922 Full Code [id number]  Lance Coon, MD Inpatient      TOTAL TIME TAKING CARE OF THIS PATIENT: 35 minutes.    Avel Peace Ellisyn Icenhower M.D on 06/08/2018 at 11:11 AM  Between 7am to 6pm - Pager - [phone]  After 6pm go to [email or web address] - password [email or web address] [facility]  Office  [phone]  CC: Primary care physician; [place]   Note: This dictation was prepared with Dragon dictation along with smaller phrase technology. Any transcriptional errors that result from this process are unintentional.

## 2018-06-08 NOTE — Plan of Care (Signed)
  Problem: Education: Goal: Knowledge of General Education information will improve Outcome: Progressing   Problem: Health Behavior/Discharge Planning: Goal: Ability to manage health-related needs will improve Outcome: Progressing   Problem: Clinical Measurements: Goal: Ability to maintain clinical measurements within normal limits will improve Outcome: Progressing Goal: Will remain free from infection Outcome: Progressing   

## 2018-06-08 NOTE — Progress Notes (Signed)
Darius Norris to be D/C'd Home per MD order.  Discussed prescriptions and follow up appointments with the patient. Prescriptions given to patient, medication list explained in detail. Pt verbalized understanding.  Allergies as of 06/08/2018      Reactions   Zantac [ranitidine Hcl] Shortness Of Breath   Codeine Itching   Erythromycin Hives   Morphine And Related Itching   Vicodin [hydrocodone-acetaminophen] Rash      Medication List    TAKE these medications   acetaminophen 325 MG tablet Commonly known as:  TYLENOL Take 650 mg by mouth every 6 (six) hours as needed. Notes to patient:  Took at 8:03am   amitriptyline 10 MG tablet Commonly known as:  ELAVIL Take 15 mg by mouth at bedtime.   aspirin 81 MG EC tablet Take 1 tablet (81 mg total) by mouth daily. Start taking on:  06/09/2018   atorvastatin 80 MG tablet Commonly known as:  LIPITOR Take 1 tablet (80 mg total) by mouth at bedtime.   budesonide-formoterol 160-4.5 MCG/ACT inhaler Commonly known as:  SYMBICORT Inhale 2 puffs into the lungs 2 (two) times daily.   busPIRone 10 MG tablet Commonly known as:  BUSPAR Take 10 mg by mouth 2 (two) times daily.   clobetasol cream 0.05 % Commonly known as:  TEMOVATE Apply 1 application topically daily as needed. For itching on psoriasis areas   COMBIVENT RESPIMAT 20-100 MCG/ACT Aers respimat Generic drug:  Ipratropium-Albuterol Inhale 1 puff into the lungs every 6 (six) hours as needed for wheezing or shortness of breath.   dabigatran 150 MG Caps capsule Commonly known as:  PRADAXA Take 150 mg by mouth 2 (two) times daily.   diltiazem 180 MG 24 hr capsule Commonly known as:  CARDIZEM CD Take 1 capsule (180 mg total) by mouth daily. Start taking on:  06/09/2018   fluticasone 50 MCG/ACT nasal spray Commonly known as:  FLONASE Place 2 sprays into both nostrils daily.   gabapentin 100 MG capsule Commonly known as:  NEURONTIN Take 100 mg by mouth at bedtime.    isosorbide mononitrate 60 MG 24 hr tablet Commonly known as:  IMDUR Take 60 mg by mouth daily.   lisinopril 40 MG tablet Commonly known as:  PRINIVIL,ZESTRIL Take 20 mg by mouth daily.   loratadine 10 MG tablet Commonly known as:  CLARITIN Take 10 mg by mouth daily as needed for allergies.   metoprolol tartrate 100 MG tablet Commonly known as:  LOPRESSOR Take 1 tablet (100 mg total) by mouth 2 (two) times daily.   omeprazole 20 MG capsule Commonly known as:  PRILOSEC Take 20 mg by mouth daily.   potassium chloride 10 MEQ tablet Commonly known as:  K-DUR,KLOR-CON Take 10 mEq by mouth daily.   senna 8.6 MG Tabs tablet Commonly known as:  SENOKOT Take 1 tablet by mouth daily as needed for mild constipation.   traZODone 50 MG tablet Commonly known as:  DESYREL Take 1 tablet (50 mg total) by mouth at bedtime as needed for sleep.       Vitals:   06/08/18 0457 06/08/18 0718  BP: 134/61 122/65  Pulse: (!) 105 96  Resp:    Temp: 97.9 F (36.6 C) 98.3 F (36.8 C)  SpO2: 95% 92%    Skin clean, dry and intact without evidence of skin break down, no evidence of skin tears noted. IV catheter discontinued intact. Site without signs and symptoms of complications. Dressing and pressure applied. Pt denies pain at this time. No  complaints noted.  An After Visit Summary was printed and given to the patient. Patient to be escorted via [place], and D/C home via private auto.  [place]

## 2018-11-17 ENCOUNTER — Other Ambulatory Visit: Payer: Self-pay

## 2018-11-17 ENCOUNTER — Emergency Department: Payer: No Typology Code available for payment source

## 2018-11-17 ENCOUNTER — Emergency Department
Admission: EM | Admit: 2018-11-17 | Discharge: 2018-11-17 | Disposition: A | Discharge status: Home / Self Care | Payer: No Typology Code available for payment source | Attending: Emergency Medicine | Admitting: Emergency Medicine

## 2018-11-17 DIAGNOSIS — J441 Chronic obstructive pulmonary disease with (acute) exacerbation: Secondary | ICD-10-CM | POA: Insufficient documentation

## 2018-11-17 DIAGNOSIS — I2510 Atherosclerotic heart disease of native coronary artery without angina pectoris: Secondary | ICD-10-CM | POA: Diagnosis not present

## 2018-11-17 DIAGNOSIS — I10 Essential (primary) hypertension: Secondary | ICD-10-CM | POA: Insufficient documentation

## 2018-11-17 DIAGNOSIS — Z87891 Personal history of nicotine dependence: Secondary | ICD-10-CM | POA: Insufficient documentation

## 2018-11-17 DIAGNOSIS — Z79899 Other long term (current) drug therapy: Secondary | ICD-10-CM | POA: Insufficient documentation

## 2018-11-17 DIAGNOSIS — Z7982 Long term (current) use of aspirin: Secondary | ICD-10-CM | POA: Insufficient documentation

## 2018-11-17 DIAGNOSIS — R0602 Shortness of breath: Secondary | ICD-10-CM | POA: Diagnosis present

## 2018-11-17 LAB — CBC WITH DIFFERENTIAL/PLATELET
Abs Immature Granulocytes: 0.08 10*3/uL — ABNORMAL HIGH (ref 0.00–0.07)
Basophils Absolute: 0.1 10*3/uL (ref 0.0–0.1)
Basophils Relative: 1 %
Eosinophils Absolute: 0.2 10*3/uL (ref 0.0–0.5)
Eosinophils Relative: 2 %
HCT: 43.5 % (ref 39.0–52.0)
Hemoglobin: 14 g/dL (ref 13.0–17.0)
Immature Granulocytes: 1 %
Lymphocytes Relative: 21 %
Lymphs Abs: 2.1 10*3/uL (ref 0.7–4.0)
MCH: 30.5 pg (ref 26.0–34.0)
MCHC: 32.2 g/dL (ref 30.0–36.0)
MCV: 94.8 fL (ref 80.0–100.0)
MONO ABS: 0.8 10*3/uL (ref 0.1–1.0)
Monocytes Relative: 8 %
Neutro Abs: 6.9 10*3/uL (ref 1.7–7.7)
Neutrophils Relative %: 67 %
Platelets: 268 10*3/uL (ref 150–400)
RBC: 4.59 MIL/uL (ref 4.22–5.81)
RDW: 16.2 % — ABNORMAL HIGH (ref 11.5–15.5)
WBC: 10.1 10*3/uL (ref 4.0–10.5)
nRBC: 0 % (ref 0.0–0.2)

## 2018-11-17 LAB — COMPREHENSIVE METABOLIC PANEL
ALK PHOS: 104 U/L (ref 38–126)
ALT: 17 U/L (ref 0–44)
AST: 17 U/L (ref 15–41)
Albumin: 3.8 g/dL (ref 3.5–5.0)
Anion gap: 10 (ref 5–15)
BILIRUBIN TOTAL: 0.5 mg/dL (ref 0.3–1.2)
BUN: 7 mg/dL — ABNORMAL LOW (ref 8–23)
CALCIUM: 8.7 mg/dL — AB (ref 8.9–10.3)
CO2: 29 mmol/L (ref 22–32)
Chloride: 100 mmol/L (ref 98–111)
Creatinine, Ser: 0.95 mg/dL (ref 0.61–1.24)
GFR calc Af Amer: >60 mL/min (ref >60)
GFR calc non Af Amer: >60 mL/min (ref >60)
Glucose, Bld: 152 mg/dL — ABNORMAL HIGH (ref 70–99)
Potassium: 3.5 mmol/L (ref 3.5–5.1)
Sodium: 139 mmol/L (ref 135–145)
Total Protein: 6.8 g/dL (ref 6.5–8.1)

## 2018-11-17 LAB — TROPONIN I: Troponin I: <0.03 ng/mL (ref <0.03)

## 2018-11-17 LAB — BRAIN NATRIURETIC PEPTIDE: B Natriuretic Peptide: 80 pg/mL (ref 0.0–100.0)

## 2018-11-17 MED ORDER — SODIUM CHLORIDE 0.9 % IV BOLUS
1000.0000 mL | Freq: Once | INTRAVENOUS | Status: AC
  Administered 2018-11-17: 1000 mL via INTRAVENOUS

## 2018-11-17 MED ORDER — ALBUTEROL SULFATE (2.5 MG/3ML) 0.083% IN NEBU
10.0000 mg | INHALATION_SOLUTION | Freq: Once | RESPIRATORY_TRACT | Status: AC
  Administered 2018-11-17: 10 mg via RESPIRATORY_TRACT
  Filled 2018-11-17: qty 12

## 2018-11-17 MED ORDER — MAGNESIUM SULFATE 2 GM/50ML IV SOLN
2.0000 g | Freq: Once | INTRAVENOUS | Status: AC
  Administered 2018-11-17: 2 g via INTRAVENOUS
  Filled 2018-11-17: qty 50

## 2018-11-17 MED ORDER — DOXYCYCLINE HYCLATE 100 MG PO TABS: 100.0000 mg | ORAL_TABLET | Freq: Two times a day (BID) | ORAL | 0 refills | Status: AC

## 2018-11-17 MED ORDER — SODIUM CHLORIDE 0.9 % IV SOLN
100.0000 mg | Freq: Once | INTRAVENOUS | Status: AC
  Administered 2018-11-17: 100 mg via INTRAVENOUS
  Filled 2018-11-17: qty 100

## 2018-11-17 MED ORDER — IPRATROPIUM-ALBUTEROL 0.5-2.5 (3) MG/3ML IN SOLN
3.0000 mL | Freq: Once | RESPIRATORY_TRACT | Status: AC
  Administered 2018-11-17: 3 mL via RESPIRATORY_TRACT
  Filled 2018-11-17: qty 3

## 2018-11-17 MED ORDER — PREDNISONE 50 MG PO TABS: 50.0000 mg | ORAL_TABLET | Freq: Every day | ORAL | 0 refills | Status: AC

## 2018-11-17 NOTE — ED Notes (Signed)
Pt stated that he has been feeling SOB for the past a few days and it got worse today. Pt wears 2 liters [place] but he is still feeling SOB.

## 2018-11-17 NOTE — ED Provider Notes (Signed)
[facility] Emergency Department Provider Note  ____________________________________________   First MD Initiated Contact with Patient 11/17/18 0601     (approximate)  I have reviewed the triage vital signs and the nursing notes.   HISTORY  Chief Complaint Shortness of Breath   HPI Darius Norris is a 67 y.o. male who comes to the emergency department via EMS with several days of progressive shortness of breath.  The patient does have a known history of COPD and uses 2 L at baseline.  He has had a cough productive of small amounts of clear phlegm.  His symptoms are worse with exertion and somewhat improved with rest.  No fevers or chills.  He does have known congestive heart failure and sleeps chronically on 2 pillows.  He is had no leg swelling.  No change in his weight.  He used breathing treatments at home with no relief.  EMS administered 125 mg of Solu-Medrol along with 2 duo nebs with some minimal improvement in his symptoms.  His symptoms came on gradually are now moderate to severe.    Past Medical History:  Diagnosis Date  . A-fib ([place])   . CAD (coronary artery disease)   . Colitis   . COPD (chronic obstructive pulmonary disease) ([place])   . Emphysema lung ([place])   . GERD (gastroesophageal reflux disease)   . Hypertension   . MI (myocardial infarction) [facility]) 1999    Patient Active Problem List   Diagnosis Date Noted  . Acute hypoxemic respiratory failure ([place]) 05/28/2018  . A-fib ([place]) 05/02/2018  . Chest pain 11/17/2017  . Unstable angina ([place]) 09/04/2016  . Uncontrolled hypertension 09/04/2016  . Hypokalemia 09/04/2016  . Leukocytosis 09/04/2016  . Hyperglycemia 09/04/2016  . COPD exacerbation ([place]) 12/15/2015  . Elevated troponin 12/15/2015  . HTN (hypertension) 12/15/2015  . GERD (gastroesophageal reflux disease) 12/15/2015  . CAD (coronary artery disease) 12/15/2015  . Angina pectoris ([place]) 12/15/2015    Past Surgical History:    Procedure Laterality Date  . ABDOMINAL HERNIA REPAIR Bilateral   . CHOLECYSTECTOMY    . COLONOSCOPY WITH PROPOFOL N/A 02/01/2018   Procedure: COLONOSCOPY WITH PROPOFOL;  Surgeon: Jonathon Bellows, MD;  [facility]: [facility] ENDOSCOPY;  Service: Gastroenterology;  Laterality: N/A;  . KNEE ARTHROSCOPY Bilateral     Prior to Admission medications   Medication Sig Start Date End Date Taking? Authorizing Provider  acetaminophen (TYLENOL) 325 MG tablet Take 650 mg by mouth every 6 (six) hours as needed.     [provider]  amitriptyline (ELAVIL) 10 MG tablet Take 15 mg by mouth at bedtime.     [provider]  aspirin EC 81 MG EC tablet Take 1 tablet (81 mg total) by mouth daily. 06/09/18   [name], Avel Peace, MD  atorvastatin (LIPITOR) 80 MG tablet Take 1 tablet (80 mg total) by mouth at bedtime. 11/18/17   Vaughan Basta, MD  budesonide-formoterol (SYMBICORT) 160-4.5 MCG/ACT inhaler Inhale 2 puffs into the lungs 2 (two) times daily.    [provider]  busPIRone (BUSPAR) 10 MG tablet Take 10 mg by mouth 2 (two) times daily.    [provider]  clobetasol cream (TEMOVATE) 8.67 % Apply 1 application topically daily as needed. For itching on psoriasis areas    [provider]  dabigatran (PRADAXA) 150 MG CAPS capsule Take 150 mg by mouth 2 (two) times daily.    [provider]  diltiazem (CARDIZEM CD) 180 MG 24 hr capsule Take 1 capsule (  180 mg total) by mouth daily. 06/09/18   [name], Avel Peace, MD  doxycycline (VIBRA-TABS) 100 MG tablet Take 1 tablet (100 mg total) by mouth 2 (two) times daily for 7 days. 11/17/18 11/24/18  Darel Hong, MD  fluticasone (FLONASE) 50 MCG/ACT nasal spray Place 2 sprays into both nostrils daily.    [provider]  gabapentin (NEURONTIN) 100 MG capsule Take 100 mg by mouth at bedtime.    [provider]  Ipratropium-Albuterol (COMBIVENT RESPIMAT) 20-100 MCG/ACT AERS respimat Inhale 1 puff into the lungs  every 6 (six) hours as needed for wheezing or shortness of breath.    [provider]  isosorbide mononitrate (IMDUR) 60 MG 24 hr tablet Take 60 mg by mouth daily.    [provider]  lisinopril (PRINIVIL,ZESTRIL) 40 MG tablet Take 20 mg by mouth daily.     [provider]  loratadine (CLARITIN) 10 MG tablet Take 10 mg by mouth daily as needed for allergies.     [provider]  metoprolol tartrate (LOPRESSOR) 100 MG tablet Take 1 tablet (100 mg total) by mouth 2 (two) times daily. 05/04/18   Fritzi Mandes, MD  omeprazole (PRILOSEC) 20 MG capsule Take 20 mg by mouth daily.     [provider]  potassium chloride (K-DUR,KLOR-CON) 10 MEQ tablet Take 10 mEq by mouth daily.    [provider]  predniSONE (DELTASONE) 50 MG tablet Take 1 tablet (50 mg total) by mouth daily for 4 days. 11/17/18 11/21/18  Darel Hong, MD  senna (SENOKOT) 8.6 MG TABS tablet Take 1 tablet by mouth daily as needed for mild constipation.    [provider]  traZODone (DESYREL) 50 MG tablet Take 1 tablet (50 mg total) by mouth at bedtime as needed for sleep. 05/29/18   [name], Avel Peace, MD    Allergies Zantac [ranitidine hcl]; Codeine; Erythromycin; Morphine and related; and Vicodin [hydrocodone-acetaminophen]  Family History  Problem Relation Age of Onset  . CAD Father   . Stroke Father   . CAD Sister   . Stroke Brother     Social History Social History   Tobacco Use  . Smoking status: Former Research scientist (life sciences)  . Smokeless tobacco: Never Used  Substance Use Topics  . Alcohol use: No  . Drug use: No    Review of Systems Constitutional: No fever/chills Eyes: No visual changes. ENT: No sore throat. Cardiovascular: Denies chest pain. Respiratory: Positive for shortness of breath. Gastrointestinal: No abdominal pain.  No nausea, no vomiting.  No diarrhea.  No constipation. Genitourinary: Negative for dysuria. Musculoskeletal: Negative for back pain. Skin:  Negative for rash. Neurological: Negative for headaches, focal weakness or numbness.   ____________________________________________   PHYSICAL EXAM:  VITAL SIGNS: ED Triage Vitals [11/17/18 0559]  Enc Vitals Group     BP      Pulse      Resp (!) 30     Temp      Temp src      SpO2      Weight 210 lb (95.3 kg)     Height 5\' 6"  (1.676 m)     Head Circumference      Peak Flow      Pain Score 0     Pain Loc      Pain Edu?      Excl. in [place]?     Constitutional: Alert and oriented x4 in moderate respiratory distress speaking in 2-3 word sentences and using accessory muscles Eyes: PERRL EOMI.  Head: Atraumatic. Nose: No congestion/rhinnorhea. Mouth/Throat: No trismus Neck: No stridor.  Able to lie completely flat and only minimal JVD Cardiovascular: Normal rate, regular rhythm. Grossly normal heart sounds.  Good peripheral circulation. Respiratory: Moderate respiratory distress using accessory muscles.  Lungs are quite tight with slight expiratory wheezing throughout although moving limited amounts of air Gastrointestinal: Obese soft nontender Musculoskeletal: No appreciable edema.  Legs are equal in size Neurologic:  Normal speech and language. No gross focal neurologic deficits are appreciated. Skin:  Skin is warm, dry and intact. No rash noted. Psychiatric: Highly anxious appearing    ____________________________________________   DIFFERENTIAL includes but not limited to  COPD exacerbation, CHF exacerbation, pneumonia, pulmonary embolism, pneumothorax, lobar collapse, acute coronary syndrome ____________________________________________   LABS (all labs ordered are listed, but only abnormal results are displayed)  Labs Reviewed  COMPREHENSIVE METABOLIC PANEL - Abnormal; Notable for the following components:      Result Value   Glucose, Bld 152 (*)    BUN 7 (*)    Calcium 8.7 (*)    All other components within normal limits  CBC WITH DIFFERENTIAL/PLATELET -  Abnormal; Notable for the following components:   RDW 16.2 (*)    Abs Immature Granulocytes 0.08 (*)    All other components within normal limits  BRAIN NATRIURETIC PEPTIDE  TROPONIN I    Lab work reviewed by me with no signs of acute ischemia or fluid overload __________________________________________  EKG  ED ECG REPORT I, Darel Hong, the attending physician, personally viewed and interpreted this ECG.  Date: 11/17/2018 EKG Time:  Rate: 71 Rhythm: normal sinus rhythm QRS Axis: normal Intervals: normal ST/T Wave abnormalities: LVH with repolarization abnormalities.  No ST elevation concerning for acute ischemia.  EKG unchanged when compared to previous on 06/07/2018 Narrative Interpretation: no evidence of acute ischemia  ____________________________________________  RADIOLOGY  Chest x-ray reviewed by me with chronic changes but no acute disease noted ____________________________________________   PROCEDURES  Procedure(s) performed: no  Procedures  Critical Care performed: no  ____________________________________________   INITIAL IMPRESSION / ASSESSMENT AND PLAN / ED COURSE  Pertinent labs & imaging results that were available during my care of the patient were reviewed by me and considered in my medical decision making (see chart for details).   As part of my medical decision making, I reviewed the following data within the [place] History obtained from family if available, nursing notes, old chart and ekg, as well as notes from prior ED visits.  The patient comes to the emergency department with tight sounding lungs and elevated respiratory rate.  He is actually saturating just fine but given his clinical condition I do think this is more COPD than CHF.  He has no leg swelling can lie flat.  I will finish off with the third DuoNeb and then give 10 mg of albuterol.  2 g of IV magnesium rapidly along with doxycycline and a liter of fluids  for his insensible losses.     ----------------------------------------- 7:12 AM on 11/17/2018 -----------------------------------------  I ambulated the patient and he is saturating 94% on room air comes up to 98% on 2 L nasal cannula now.  He feels comfortable going home after his antibiotics have been finished. ____________________________________________   FINAL CLINICAL IMPRESSION(S) / ED DIAGNOSES  Final diagnoses:  COPD exacerbation ([place])      NEW MEDICATIONS STARTED DURING THIS VISIT:  New Prescriptions   DOXYCYCLINE (VIBRA-TABS) 100 MG TABLET    Take 1 tablet (100  mg total) by mouth 2 (two) times daily for 7 days.   PREDNISONE (DELTASONE) 50 MG TABLET    Take 1 tablet (50 mg total) by mouth daily for 4 days.     Note:  This document was prepared using Dragon voice recognition software and may include unintentional dictation errors.     Darel Hong, MD 11/17/18 [phone]

## 2018-11-17 NOTE — ED Notes (Signed)
Pt stated that he feels much better after breathing treatments.

## 2018-11-17 NOTE — Discharge Instructions (Signed)
Fortunately today your lab work was reassuring and you seem to be responding to the treatment.  Please take your antibiotics as prescribed for the next week and take your steroids for the next 4 days.  Follow-up with your primary care physician and your pulmonologist as scheduled and return to the emergency department for any concerns such as worsening shortness of breath, fevers, chills, or for any other issues whatsoever.  It was a pleasure to take care of you today, and thank you for coming to our emergency department.  If you have any questions or concerns before leaving please ask the nurse to grab me and I'm more than happy to go through your aftercare instructions again.  If you have any concerns once you are home that you are not improving or are in fact getting worse before you can make it to your follow-up appointment, please do not hesitate to call [phone] and come back for further evaluation.  Darel Hong, MD  Results for orders placed or performed during the hospital encounter of 11/17/18  Comprehensive metabolic panel  Result Value Ref Range   Sodium 139 135 - 145 mmol/L   Potassium 3.5 3.5 - 5.1 mmol/L   Chloride 100 98 - 111 mmol/L   CO2 29 22 - 32 mmol/L   Glucose, Bld 152 (H) 70 - 99 mg/dL   BUN 7 (L) 8 - 23 mg/dL   Creatinine, Ser 0.95 0.61 - 1.24 mg/dL   Calcium 8.7 (L) 8.9 - 10.3 mg/dL   Total Protein 6.8 6.5 - 8.1 g/dL   Albumin 3.8 3.5 - 5.0 g/dL   AST 17 15 - 41 U/L   ALT 17 0 - 44 U/L   Alkaline Phosphatase 104 38 - 126 U/L   Total Bilirubin 0.5 0.3 - 1.2 mg/dL   GFR calc non Af Amer >60 >60 mL/min   GFR calc Af Amer >60 >60 mL/min   Anion gap 10 5 - 15  Brain natriuretic peptide  Result Value Ref Range   B Natriuretic Peptide 80.0 0.0 - 100.0 pg/mL  Troponin I - Once  Result Value Ref Range   Troponin I <0.03 <0.03 ng/mL  CBC with Differential  Result Value Ref Range   WBC 10.1 4.0 - 10.5 K/uL   RBC 4.59 4.22 - 5.81 MIL/uL   Hemoglobin 14.0 13.0 - 17.0  g/dL   HCT 43.5 39.0 - 52.0 %   MCV 94.8 80.0 - 100.0 fL   MCH 30.5 26.0 - 34.0 pg   MCHC 32.2 30.0 - 36.0 g/dL   RDW 16.2 (H) 11.5 - 15.5 %   Platelets 268 150 - 400 K/uL   nRBC 0.0 0.0 - 0.2 %   Neutrophils Relative % 67 %   Neutro Abs 6.9 1.7 - 7.7 K/uL   Lymphocytes Relative 21 %   Lymphs Abs 2.1 0.7 - 4.0 K/uL   Monocytes Relative 8 %   Monocytes Absolute 0.8 0.1 - 1.0 K/uL   Eosinophils Relative 2 %   Eosinophils Absolute 0.2 0.0 - 0.5 K/uL   Basophils Relative 1 %   Basophils Absolute 0.1 0.0 - 0.1 K/uL   Immature Granulocytes 1 %   Abs Immature Granulocytes 0.08 (H) 0.00 - 0.07 K/uL   Dg Chest Port 1 View  Result Date: 11/17/2018 CLINICAL DATA:  Shortness of breath. EXAM: PORTABLE CHEST 1 VIEW COMPARISON:  06/07/2018 FINDINGS: Prominent apical emphysema chronic hyperinflation. Prominent bibasilar opacities are similar to prior exam and may be secondary to  fibrosis or accentuated by emphysema. No confluent consolidation, pleural effusion or pneumothorax. Unchanged heart size and mediastinal contours. IMPRESSION: 1. Advanced emphysema. 2. Chronic bibasilar opacities that may be fibrotic or extension weighted by emphysema. No acute findings. Electronically Signed   By: Keith Rake M.D.   On: 11/17/2018 06:36

## 2018-11-17 NOTE — ED Triage Notes (Signed)
Pt from home with shob. Pt is on 2lpm home oxygen via . Pt received 2 duoneb and 125mg  iv solumedrol with ems. md at bedside.

## 2018-12-23 ENCOUNTER — Encounter: Payer: No Typology Code available for payment source | Attending: Internal Medicine

## 2018-12-23 ENCOUNTER — Other Ambulatory Visit: Payer: Self-pay

## 2018-12-23 VITALS — Ht 66.0 in | Wt 209.4 lb

## 2018-12-23 DIAGNOSIS — J449 Chronic obstructive pulmonary disease, unspecified: Secondary | ICD-10-CM | POA: Insufficient documentation

## 2018-12-23 NOTE — Progress Notes (Signed)
Daily Session Note  Patient Details  Name: Darius Norris MRN: [id number] Date of Birth: Oct 11, 1951 Referring Provider:     Pulmonary Rehab from 12/23/2018 in [facility]  Referring Provider  Vernelle Emerald MD      Encounter Date: 12/23/2018  Check In: Session Check In - 12/23/18 1521      Check-In   Supervising physician immediately available to respond to emergencies  [name] immediately available ER MD    Physician(s)  Dr. Kerman Passey    Location  [facility]-Cardiac & Pulmonary Rehab    Staff Present  Alberteen Sam, MA, RCEP, CCRP, Exercise Physiologist; Tessie Fass RCP,RRT,BSRT    Medication changes reported      No    Fall or balance concerns reported     No    Warm-up and Cool-down  Not performed (comment)   medical evaluation   Resistance Training Performed  No    VAD Patient?  No    PAD/SET Patient?  No      Pain Assessment   Currently in Pain?  No/denies        Exercise Prescription Changes - 12/23/18 1500      Response to Exercise   Blood Pressure (Admit)  126/64    Blood Pressure (Exercise)  164/62    Blood Pressure (Exit)  140/66    Heart Rate (Admit)  67 bpm    Heart Rate (Exercise)  99 bpm    Heart Rate (Exit)  81 bpm    Oxygen Saturation (Admit)  94 %    Oxygen Saturation (Exercise)  82 %    Oxygen Saturation (Exit)  93 %    Rating of Perceived Exertion (Exercise)  17    Perceived Dyspnea (Exercise)  3    Symptoms  back pain 6/10; SOB    Comments  walk test results       Social History   Tobacco Use  Smoking Status Former Smoker  . Packs/day: 1.00  . Years: 30.00  . Pack years: 30.00  . Types: Cigarettes  . Last attempt to quit: 12/18/2001  . Years since quitting: 17.0  Smokeless Tobacco Never Used    Goals Met:  Exercise tolerated well Personal goals reviewed Queuing for purse lip breathing No report of cardiac concerns or symptoms Strength training completed today  Goals Unmet:  Not Applicable  Comments:  6  Minute Walk    Row Name 12/23/18 1510         6 Minute Walk   Phase  Initial     Distance  523 feet     Walk Time  3.92 minutes     # of Rest Breaks  2 63 sec; 62 sec     MPH  1.52     METS  1.57     RPE  11     Perceived Dyspnea   3     VO2 Peak  5.49     Symptoms  Yes (comment)     Comments  SOB; back pain 6/10     Resting HR  67 bpm     Resting BP  126/64     Resting Oxygen Saturation   94 %     Exercise Oxygen Saturation  during 6 min walk  82 %     Max Ex. HR  99 bpm     Max Ex. BP  164/62     2 Minute Post BP  140/66       Interval HR  1 Minute HR  94     2 Minute HR  92     3 Minute HR  91     4 Minute HR  99     5 Minute HR  93     6 Minute HR  98     2 Minute Post HR  81     Interval Heart Rate?  Yes       Interval Oxygen   Interval Oxygen?  Yes     Baseline Oxygen Saturation %  94 %     1 Minute Oxygen Saturation %  86 % seated rest 1:17-2:20     1 Minute Liters of Oxygen  2 L     2 Minute Oxygen Saturation %  82 %     2 Minute Liters of Oxygen  2 L     3 Minute Oxygen Saturation %  88 % 3:43-4:45     3 Minute Liters of Oxygen  2 L     4 Minute Oxygen Saturation %  82 %     4 Minute Liters of Oxygen  2 L     5 Minute Oxygen Saturation %  90 %     5 Minute Liters of Oxygen  2 L     6 Minute Oxygen Saturation %  83 %     6 Minute Liters of Oxygen  2 L     2 Minute Post Oxygen Saturation %  93 %     2 Minute Post Liters of Oxygen  2 L       Service Time 1400-1500   Dr. Emily Filbert is Medical Director for [facility] and [name].

## 2018-12-23 NOTE — Progress Notes (Signed)
Pulmonary Individual Treatment Plan  Patient Details  Name: Darius Norris MRN: [id number] Date of Birth: 10/08/1951 Referring Provider:     Pulmonary Rehab from 12/23/2018 in [facility] Cardiac and Pulmonary Rehab  Referring Provider  Vernelle Emerald MD      Initial Encounter Date:    Pulmonary Rehab from 12/23/2018 in [facility] Cardiac and Pulmonary Rehab  Date  12/23/18      Visit Diagnosis: Chronic obstructive pulmonary disease, unspecified COPD type ([place])  Patient's Home Medications on Admission:  Current Outpatient Medications:  .  acetaminophen (TYLENOL) 325 MG tablet, Take 650 mg by mouth every 6 (six) hours as needed. , Disp: , Rfl:  .  amitriptyline (ELAVIL) 10 MG tablet, Take 15 mg by mouth at bedtime. , Disp: , Rfl:  .  aspirin EC 81 MG EC tablet, Take 1 tablet (81 mg total) by mouth daily., Disp: 180 tablet, Rfl: 0 .  atorvastatin (LIPITOR) 80 MG tablet, Take 1 tablet (80 mg total) by mouth at bedtime., Disp: 30 tablet, Rfl: 0 .  budesonide-formoterol (SYMBICORT) 160-4.5 MCG/ACT inhaler, Inhale 2 puffs into the lungs 2 (two) times daily., Disp: , Rfl:  .  busPIRone (BUSPAR) 10 MG tablet, Take 10 mg by mouth 2 (two) times daily., Disp: , Rfl:  .  clobetasol cream (TEMOVATE) 0.62 %, Apply 1 application topically daily as needed. For itching on psoriasis areas, Disp: , Rfl:  .  dabigatran (PRADAXA) 150 MG CAPS capsule, Take 150 mg by mouth 2 (two) times daily., Disp: , Rfl:  .  diltiazem (CARDIZEM CD) 180 MG 24 hr capsule, Take 1 capsule (180 mg total) by mouth daily., Disp: 90 capsule, Rfl: 0 .  fluticasone (FLONASE) 50 MCG/ACT nasal spray, Place 2 sprays into both nostrils daily., Disp: , Rfl:  .  gabapentin (NEURONTIN) 100 MG capsule, Take 100 mg by mouth at bedtime., Disp: , Rfl:  .  Ipratropium-Albuterol (COMBIVENT RESPIMAT) 20-100 MCG/ACT AERS respimat, Inhale 1 puff into the lungs every 6 (six) hours as needed for wheezing or shortness of breath., Disp: , Rfl:  .  isosorbide  mononitrate (IMDUR) 60 MG 24 hr tablet, Take 60 mg by mouth daily., Disp: , Rfl:  .  lisinopril (PRINIVIL,ZESTRIL) 40 MG tablet, Take 20 mg by mouth daily. , Disp: , Rfl:  .  loratadine (CLARITIN) 10 MG tablet, Take 10 mg by mouth daily as needed for allergies. , Disp: , Rfl:  .  metoprolol tartrate (LOPRESSOR) 100 MG tablet, Take 1 tablet (100 mg total) by mouth 2 (two) times daily., Disp: 60 tablet, Rfl: 0 .  omeprazole (PRILOSEC) 20 MG capsule, Take 20 mg by mouth daily. , Disp: , Rfl:  .  potassium chloride (K-DUR,KLOR-CON) 10 MEQ tablet, Take 10 mEq by mouth daily., Disp: , Rfl:  .  senna (SENOKOT) 8.6 MG TABS tablet, Take 1 tablet by mouth daily as needed for mild constipation., Disp: , Rfl:  .  traZODone (DESYREL) 50 MG tablet, Take 1 tablet (50 mg total) by mouth at bedtime as needed for sleep., Disp: 30 tablet, Rfl: 0  Past Medical History: Past Medical History:  Diagnosis Date  . A-fib ([place])   . CAD (coronary artery disease)   . Colitis   . COPD (chronic obstructive pulmonary disease) ([place])   . Emphysema lung ([place])   . GERD (gastroesophageal reflux disease)   . Hypertension   . MI (myocardial infarction) ([place]) 1999    Tobacco Use: Social History   Tobacco Use  Smoking Status Former  Smoker  . Packs/day: 1.00  . Years: 30.00  . Pack years: 30.00  . Types: Cigarettes  . Last attempt to quit: 12/18/2001  . Years since quitting: 17.0  Smokeless Tobacco Never Used    Labs: Recent Chemical engineer    Labs for ITP Cardiac and Pulmonary Rehab Latest Ref Rng & Units 02/04/2013 09/04/2016 05/02/2018   Cholestrol 0 - 200 mg/dL 185 116 -   LDLCALC 0 - 99 mg/dL 114(H) 51 -   HDL >40 mg/dL 38(L) 42 -   Trlycerides <150 mg/dL 166 116 -   Hemoglobin A1c 4.8 - 5.6 % - 6.4(H) -   HCO3 20.0 - 28.0 mmol/L - - 36.3(H)       Pulmonary Assessment Scores: Pulmonary Assessment Scores    Row Name 12/23/18 1425         ADL UCSD   ADL Phase  Entry     SOB Score total  90      Rest  3     Walk  4     Stairs  5     Bath  4     Dress  2     Shop  3       CAT Score   CAT Score  24       mMRC Score   mMRC Score  2        Pulmonary Function Assessment: Pulmonary Function Assessment - 12/23/18 1423      Post Bronchodilator Spirometry Results   Comments  results taken from 06/27/16      Breath   Bilateral Breath Sounds  Clear    Shortness of Breath  Yes;Fear of Shortness of Breath;Limiting activity       Exercise Target Goals: Exercise Program Goal: Individual exercise prescription set using results from initial 6 min walk test and THRR while considering  patient's activity barriers and safety.   Exercise Prescription Goal: Initial exercise prescription builds to 30-45 minutes a day of aerobic activity, 2-3 days per week.  Home exercise guidelines will be given to patient during program as part of exercise prescription that the participant will acknowledge.  Activity Barriers & Risk Stratification: Activity Barriers & Cardiac Risk Stratification - 12/23/18 1517      Activity Barriers & Cardiac Risk Stratification   Activity Barriers  Shortness of Breath;Deconditioning;Back Problems;Muscular Weakness;Balance Concerns;Assistive Device   chronic back pain      6 Minute Walk: 6 Minute Walk    Row Name 12/23/18 1510         6 Minute Walk   Phase  Initial     Distance  523 feet     Walk Time  3.92 minutes     # of Rest Breaks  2 63 sec; 62 sec     MPH  1.52     METS  1.57     RPE  11     Perceived Dyspnea   3     VO2 Peak  5.49     Symptoms  Yes (comment)     Comments  SOB; back pain 6/10     Resting HR  67 bpm     Resting BP  126/64     Resting Oxygen Saturation   94 %     Exercise Oxygen Saturation  during 6 min walk  82 %     Max Ex. HR  99 bpm     Max Ex. BP  164/62     2  Minute Post BP  140/66       Interval HR   1 Minute HR  94     2 Minute HR  92     3 Minute HR  91     4 Minute HR  99     5 Minute HR  93     6 Minute HR   98     2 Minute Post HR  81     Interval Heart Rate?  Yes       Interval Oxygen   Interval Oxygen?  Yes     Baseline Oxygen Saturation %  94 %     1 Minute Oxygen Saturation %  86 % seated rest 1:17-2:20     1 Minute Liters of Oxygen  2 L     2 Minute Oxygen Saturation %  82 %     2 Minute Liters of Oxygen  2 L     3 Minute Oxygen Saturation %  88 % 3:43-4:45     3 Minute Liters of Oxygen  2 L     4 Minute Oxygen Saturation %  82 %     4 Minute Liters of Oxygen  2 L     5 Minute Oxygen Saturation %  90 %     5 Minute Liters of Oxygen  2 L     6 Minute Oxygen Saturation %  83 %     6 Minute Liters of Oxygen  2 L     2 Minute Post Oxygen Saturation %  93 %     2 Minute Post Liters of Oxygen  2 L       Oxygen Initial Assessment: Oxygen Initial Assessment - 12/23/18 1413      Home Oxygen   Home Oxygen Device  Home Concentrator;E-Tanks    Sleep Oxygen Prescription  Continuous    Liters per minute  2    Home Exercise Oxygen Prescription  Continuous    Liters per minute  2    Home at Rest Exercise Oxygen Prescription  Continuous    Liters per minute  2    Compliance with Home Oxygen Use  Yes      Initial 6 min Walk   Oxygen Used  Continuous    Liters per minute  2      Program Oxygen Prescription   Program Oxygen Prescription  Continuous    Liters per minute  2      Intervention   Short Term Goals  To learn and exhibit compliance with exercise, home and travel O2 prescription;To learn and understand importance of monitoring SPO2 with pulse oximeter and demonstrate accurate use of the pulse oximeter.;To learn and understand importance of maintaining oxygen saturations>88%;To learn and demonstrate proper pursed lip breathing techniques or other breathing techniques.;To learn and demonstrate proper use of respiratory medications    Long  Term Goals  Exhibits compliance with exercise, home and travel O2 prescription;Verbalizes importance of monitoring SPO2 with pulse oximeter and  return demonstration;Maintenance of O2 saturations>88%;Exhibits proper breathing techniques, such as pursed lip breathing or other method taught during program session;Compliance with respiratory medication;Demonstrates proper use of MDI's       Oxygen Re-Evaluation:   Oxygen Discharge (Final Oxygen Re-Evaluation):   Initial Exercise Prescription: Initial Exercise Prescription - 12/23/18 1500      Date of Initial Exercise RX and Referring Provider   Date  12/23/18    Referring Provider  Vernelle Emerald MD  Oxygen   Oxygen  Continuous    Liters  3      Treadmill   MPH  1    Grade  0.5    Minutes  15    METs  1.83      NuStep   Level  1    SPM  80    Minutes  15    METs  1.8      REL-XR   Level  1    Speed  50    Minutes  15    METs  1.8      Prescription Details   Frequency (times per week)  3    Duration  Progress to 45 minutes of aerobic exercise without signs/symptoms of physical distress      Intensity   THRR 40-80% of Max Heartrate  101-136    Ratings of Perceived Exertion  11-13    Perceived Dyspnea  0-4      Progression   Progression  Continue to progress workloads to maintain intensity without signs/symptoms of physical distress.      Resistance Training   Training Prescription  Yes    Weight  3 lbs    Reps  10-15       Perform Capillary Blood Glucose checks as needed.  Exercise Prescription Changes: Exercise Prescription Changes    Row Name 12/23/18 1500             Response to Exercise   Blood Pressure (Admit)  126/64       Blood Pressure (Exercise)  164/62       Blood Pressure (Exit)  140/66       Heart Rate (Admit)  67 bpm       Heart Rate (Exercise)  99 bpm       Heart Rate (Exit)  81 bpm       Oxygen Saturation (Admit)  94 %       Oxygen Saturation (Exercise)  82 %       Oxygen Saturation (Exit)  93 %       Rating of Perceived Exertion (Exercise)  17       Perceived Dyspnea (Exercise)  3       Symptoms  back pain 6/10;  SOB       Comments  walk test results          Exercise Comments:   Exercise Goals and Review: Exercise Goals    Row Name 12/23/18 1554             Exercise Goals   Increase Physical Activity  Yes       Intervention  Provide advice, education, support and counseling about physical activity/exercise needs.;Develop an individualized exercise prescription for aerobic and resistive training based on initial evaluation findings, risk stratification, comorbidities and participant's personal goals.       Expected Outcomes  Short Term: Attend rehab on a regular basis to increase amount of physical activity.;Long Term: Add in home exercise to make exercise part of routine and to increase amount of physical activity.;Long Term: Exercising regularly at least 3-5 days a week.       Increase Strength and Stamina  Yes       Intervention  Provide advice, education, support and counseling about physical activity/exercise needs.;Develop an individualized exercise prescription for aerobic and resistive training based on initial evaluation findings, risk stratification, comorbidities and participant's personal goals.       Expected Outcomes  Short Term: Increase  workloads from initial exercise prescription for resistance, speed, and METs.;Short Term: Perform resistance training exercises routinely during rehab and add in resistance training at home;Long Term: Improve cardiorespiratory fitness, muscular endurance and strength as measured by increased METs and functional capacity (6MWT)       Able to understand and use rate of perceived exertion (RPE) scale  Yes       Intervention  Provide education and explanation on how to use RPE scale       Expected Outcomes  Short Term: Able to use RPE daily in rehab to express subjective intensity level;Long Term:  Able to use RPE to guide intensity level when exercising independently       Able to understand and use Dyspnea scale  Yes       Intervention  Provide  education and explanation on how to use Dyspnea scale       Expected Outcomes  Short Term: Able to use Dyspnea scale daily in rehab to express subjective sense of shortness of breath during exertion;Long Term: Able to use Dyspnea scale to guide intensity level when exercising independently       Knowledge and understanding of Target Heart Rate Range (THRR)  Yes       Intervention  Provide education and explanation of THRR including how the numbers were predicted and where they are located for reference       Expected Outcomes  Short Term: Able to state/look up THRR;Short Term: Able to use daily as guideline for intensity in rehab;Long Term: Able to use THRR to govern intensity when exercising independently       Able to check pulse independently  Yes       Intervention  Provide education and demonstration on how to check pulse in carotid and radial arteries.;Review the importance of being able to check your own pulse for safety during independent exercise       Expected Outcomes  Short Term: Able to explain why pulse checking is important during independent exercise;Long Term: Able to check pulse independently and accurately       Understanding of Exercise Prescription  Yes       Intervention  Provide education, explanation, and written materials on patient's individual exercise prescription       Expected Outcomes  Short Term: Able to explain program exercise prescription;Long Term: Able to explain home exercise prescription to exercise independently          Exercise Goals Re-Evaluation :   Discharge Exercise Prescription (Final Exercise Prescription Changes): Exercise Prescription Changes - 12/23/18 1500      Response to Exercise   Blood Pressure (Admit)  126/64    Blood Pressure (Exercise)  164/62    Blood Pressure (Exit)  140/66    Heart Rate (Admit)  67 bpm    Heart Rate (Exercise)  99 bpm    Heart Rate (Exit)  81 bpm    Oxygen Saturation (Admit)  94 %    Oxygen Saturation  (Exercise)  82 %    Oxygen Saturation (Exit)  93 %    Rating of Perceived Exertion (Exercise)  17    Perceived Dyspnea (Exercise)  3    Symptoms  back pain 6/10; SOB    Comments  walk test results       Nutrition:  Target Goals: Understanding of nutrition guidelines, daily intake of sodium <1513m, cholesterol <2012m calories 30% from fat and 7% or less from saturated fats, daily to have 5 or more servings of  fruits and vegetables.  Biometrics: Pre Biometrics - 12/23/18 1554      Pre Biometrics   Height  5' 6" (1.676 m)    Weight  209 lb 6.4 oz (95 kg)    Waist Circumference  43 inches    Hip Circumference  47 inches    Waist to Hip Ratio  0.91 %    BMI (Calculated)  33.81    Single Leg Stand  1.41 seconds        Nutrition Therapy Plan and Nutrition Goals: Nutrition Therapy & Goals - 12/23/18 1416      Personal Nutrition Goals   Nutrition Goal  Lose weight    Personal Goal #2  Learn how to eat healthier    Comments  He has been gaining some weight due to prednisone.      Intervention Plan   Intervention  Prescribe, educate and counsel regarding individualized specific dietary modifications aiming towards targeted core components such as weight, hypertension, lipid management, diabetes, heart failure and other comorbidities.;Nutrition handout(s) given to patient.    Expected Outcomes  Short Term Goal: Understand basic principles of dietary content, such as calories, fat, sodium, cholesterol and nutrients.;Long Term Goal: Adherence to prescribed nutrition plan.       Nutrition Assessments:   Nutrition Goals Re-Evaluation:   Nutrition Goals Discharge (Final Nutrition Goals Re-Evaluation):   Psychosocial: Target Goals: Acknowledge presence or absence of significant depression and/or stress, maximize coping skills, provide positive support system. Participant is able to verbalize types and ability to use techniques and skills needed for reducing stress and depression.    Initial Review & Psychosocial Screening: Initial Psych Review & Screening - 12/23/18 1415      Initial Review   Current issues with  Current Anxiety/Panic;Current Stress Concerns    Source of Stress Concerns  Chronic Illness    Comments  Sometimes has panic issues with shortness of breath.      Family Dynamics   Good Support System?  Yes    Comments  He can look to his wife and his two sisters for support.      Barriers   Psychosocial barriers to participate in program  The patient should benefit from training in stress management and relaxation.      Screening Interventions   Interventions  Encouraged to exercise;Program counselor consult;To provide support and resources with identified psychosocial needs;Provide feedback about the scores to participant    Expected Outcomes  Short Term goal: Utilizing psychosocial counselor, staff and physician to assist with identification of specific Stressors or current issues interfering with healing process. Setting desired goal for each stressor or current issue identified.;Long Term Goal: Stressors or current issues are controlled or eliminated.;Short Term goal: Identification and review with participant of any Quality of Life or Depression concerns found by scoring the questionnaire.;Long Term goal: The participant improves quality of Life and PHQ9 Scores as seen by post scores and/or verbalization of changes       Quality of Life Scores:  Scores of 19 and below usually indicate a poorer quality of life in these areas.  A difference of  2-3 points is a clinically meaningful difference.  A difference of 2-3 points in the total score of the Quality of Life Index has been associated with significant improvement in overall quality of life, self-image, physical symptoms, and general health in studies assessing change in quality of life.  PHQ-9: Recent Review Flowsheet Data    Depression screen [facility] 2/9 12/23/2018 11/20/2016 06/27/2016  Decreased  Interest 2 1 0   Down, Depressed, Hopeless 0 1 0   PHQ - 2 Score 2 2 0   Altered sleeping _0 Tired, decreased energy _1 Change in appetite _2 Feeling bad or failure about yourself  0 0 0   Trouble concentrating 0 0 0   Moving slowly or fidgety/restless 1 0 0   Suicidal thoughts 0 0 0   PHQ-9 Score _3 Difficult doing work/chores Very difficult Very difficult Somewhat difficult     Interpretation of Total Score  Total Score Depression Severity:  1-4 = Minimal depression, 5-9 = Mild depression, 10-14 = Moderate depression, 15-19 = Moderately severe depression, 20-27 = Severe depression   Psychosocial Evaluation and Intervention:   Psychosocial Re-Evaluation:   Psychosocial Discharge (Final Psychosocial Re-Evaluation):   Education: Education Goals: Education classes will be provided on a weekly basis, covering required topics. Participant will state understanding/return demonstration of topics presented.  Learning Barriers/Preferences: Learning Barriers/Preferences - 12/23/18 1417      Learning Barriers/Preferences   Learning Barriers  None    Learning Preferences  Group Instruction;Individual Instruction;Pictoral;Skilled Demonstration;Verbal Instruction;Video;Written Material       Education Topics:  Initial Evaluation Education: - Verbal, written and demonstration of respiratory meds, oximetry and breathing techniques. Instruction on use of nebulizers and MDIs and importance of monitoring MDI activations.   Pulmonary Rehab from 12/23/2018 in [facility] Cardiac and Pulmonary Rehab  Date  12/23/18  Educator  [facility]  Instruction Review Code  1- Verbalizes Understanding      General Nutrition Guidelines/Fats and Fiber: -Group instruction provided by verbal, written material, models and posters to present the general guidelines for heart healthy nutrition. Gives an explanation and review of dietary fats and fiber.   Controlling Sodium/Reading Food  Labels: -Group verbal and written material supporting the discussion of sodium use in heart healthy nutrition. Review and explanation with models, verbal and written materials for utilization of the food label.   Exercise Physiology & General Exercise Guidelines: - Group verbal and written instruction with models to review the exercise physiology of the cardiovascular system and associated critical values. Provides general exercise guidelines with specific guidelines to those with heart or lung disease.    Aerobic Exercise & Resistance Training: - Gives group verbal and written instruction on the various components of exercise. Focuses on aerobic and resistive training programs and the benefits of this training and how to safely progress through these programs.   Pulmonary Rehab from 04/21/2015 in [place]  Date  04/21/15  Educator  [place]  Instruction Review Code (retired)  2- meets Programmer, multimedia, Balance, Mind/Body Relaxation: Provides group verbal/written instruction on the benefits of flexibility and balance training, including mind/body exercise modes such as yoga, pilates and tai chi.  Demonstration and skill practice provided.   Pulmonary Rehab from 04/21/2015 in [place]  Date  04/21/15  Educator  [place]  Instruction Review Code (retired)  2- meets goals/outcomes      Stress and Anxiety: - Provides group verbal and written instruction about the health risks of elevated stress and causes of high stress.  Discuss the correlation between heart/lung disease and anxiety and treatment options. Review healthy ways to manage with stress and anxiety.   Depression: - Provides group verbal and written instruction on the correlation between heart/lung disease and  depressed mood, treatment options, and the stigmas associated with seeking treatment.   Exercise & Equipment Safety: -  Individual verbal instruction and demonstration of equipment use and safety with use of the equipment.   Pulmonary Rehab from 12/23/2018 in [facility] Cardiac and Pulmonary Rehab  Date  12/23/18  Educator  [facility]  Instruction Review Code  1- Verbalizes Understanding      Infection Prevention: - Provides verbal and written material to individual with discussion of infection control including proper hand washing and proper equipment cleaning during exercise session.   Pulmonary Rehab from 12/23/2018 in [facility] Cardiac and Pulmonary Rehab  Date  12/23/18  Educator  [facility]  Instruction Review Code  1- Verbalizes Understanding      Falls Prevention: - Provides verbal and written material to individual with discussion of falls prevention and safety.   Pulmonary Rehab from 12/23/2018 in [facility] Cardiac and Pulmonary Rehab  Date  12/23/18  Educator  [facility]  Instruction Review Code  1- Verbalizes Understanding      Diabetes: - Individual verbal and written instruction to review signs/symptoms of diabetes, desired ranges of glucose level fasting, after meals and with exercise. Advice that pre and post exercise glucose checks will be done for 3 sessions at entry of program.   Chronic Lung Diseases: - Group verbal and written instruction to review updates, respiratory medications, advancements in procedures and treatments. Discuss use of supplemental oxygen including available portable oxygen systems, continuous and intermittent flow rates, concentrators, personal use and safety guidelines. Review proper use of inhaler and spacers. Provide informative websites for self-education.    Pulmonary Rehab from 04/26/2015 in [place]  Date  04/26/15  Educator  [facility]  Instruction Review Code (retired)  2- meets Chief Financial Officer: - Provide group verbal and written instruction for methods to conserve energy, plan and organize activities. Instruct on pacing techniques, use  of adaptive equipment and posture/positioning to relieve shortness of breath.   Pulmonary Rehab from 05/24/2015 in [place]  Date  05/12/15  Educator  SW  Instruction Review Code (retired)  2- meets goals/outcomes      Triggers and Exacerbations: - Group verbal and written instruction to review types of environmental triggers and ways to prevent exacerbations. Discuss weather changes, air quality and the benefits of nasal washing. Review warning signs and symptoms to help prevent infections. Discuss techniques for effective airway clearance, coughing, and vibrations.   AED/CPR: - Group verbal and written instruction with the use of models to demonstrate the basic use of the AED with the basic ABC's of resuscitation.   Anatomy and Physiology of the Lungs: - Group verbal and written instruction with the use of models to provide basic lung anatomy and physiology related to function, structure and complications of lung disease.   Anatomy & Physiology of the Heart: - Group verbal and written instruction and models provide basic cardiac anatomy and physiology, with the coronary electrical and arterial systems. Review of Valvular disease and Heart Failure   Cardiac Medications: - Group verbal and written instruction to review commonly prescribed medications for heart disease. Reviews the medication, class of the drug, and side effects.   Know Your Numbers and Risk Factors: -Group verbal and written instruction about important numbers in your health.  Discussion of what are risk factors and how they play a role in the disease process.  Review of Cholesterol, Blood Pressure, Diabetes, and BMI and the role  they play in your overall health.   Sleep Hygiene: -Provides group verbal and written instruction about how sleep can affect your health.  Define sleep hygiene, discuss sleep cycles and impact of sleep habits. Review good sleep hygiene tips.     Other: -Provides group and verbal instruction on various topics (see comments)    Knowledge Questionnaire Score: Knowledge Questionnaire Score - 12/23/18 1423      Knowledge Questionnaire Score   Pre Score  16/18   reviewed with patient       Core Components/Risk Factors/Patient Goals at Admission: Personal Goals and Risk Factors at Admission - 12/23/18 1418      Core Components/Risk Factors/Patient Goals on Admission    Weight Management  Yes;Weight Loss    Intervention  Weight Management: Develop a combined nutrition and exercise program designed to reach desired caloric intake, while maintaining appropriate intake of nutrient and fiber, sodium and fats, and appropriate energy expenditure required for the weight goal.;Weight Management: Provide education and appropriate resources to help participant work on and attain dietary goals.    Admit Weight  209 lb 6.4 oz (95 kg)    Goal Weight: Short Term  204 lb (92.5 kg)    Goal Weight: Long Term  180 lb (81.6 kg)    Expected Outcomes  Short Term: Continue to assess and modify interventions until short term weight is achieved;Long Term: Adherence to nutrition and physical activity/exercise program aimed toward attainment of established weight goal;Weight Loss: Understanding of general recommendations for a balanced deficit meal plan, which promotes 1-2 lb weight loss per week and includes a negative energy balance of [phone] kcal/d;Understanding recommendations for meals to include 15-35% energy as protein, 25-35% energy from fat, 35-60% energy from carbohydrates, less than 241m of dietary cholesterol, 20-35 gm of total fiber daily;Understanding of distribution of calorie intake throughout the day with the consumption of 4-5 meals/snacks    Improve shortness of breath with ADL's  Yes    Intervention  Provide education, individualized exercise plan and daily activity instruction to help decrease symptoms of SOB with activities of daily  living.    Expected Outcomes  Short Term: Improve cardiorespiratory fitness to achieve a reduction of symptoms when performing ADLs;Long Term: Be able to perform more ADLs without symptoms or delay the onset of symptoms    Hypertension  Yes   AFIB   Intervention  Provide education on lifestyle modifcations including regular physical activity/exercise, weight management, moderate sodium restriction and increased consumption of fresh fruit, vegetables, and low fat dairy, alcohol moderation, and smoking cessation.;Monitor prescription use compliance.    Expected Outcomes  Short Term: Continued assessment and intervention until BP is < 140/958mHG in hypertensive participants. < 130/8047mG in hypertensive participants with diabetes, heart failure or chronic kidney disease.;Long Term: Maintenance of blood pressure at goal levels.    Lipids  Yes    Intervention  Provide education and support for participant on nutrition & aerobic/resistive exercise along with prescribed medications to achieve LDL <47m1mDL >40mg75m Expected Outcomes  Short Term: Participant states understanding of desired cholesterol values and is compliant with medications prescribed. Participant is following exercise prescription and nutrition guidelines.;Long Term: Cholesterol controlled with medications as prescribed, with individualized exercise RX and with personalized nutrition plan. Value goals: LDL < 47mg,31m > 40 mg.       Core Components/Risk Factors/Patient Goals Review:    Core Components/Risk Factors/Patient Goals at Discharge (Final Review):    ITP Comments: ITP Comments  [place] Name 12/23/18 1525           ITP Comments  Medical Evaluation completed. Chart sent for review and changes to Dr. Emily Filbert Director of [facility]. Diagnosis can be found in Care everywhere/media.          Comments: Initial ITP

## 2018-12-23 NOTE — Patient Instructions (Addendum)
Patient Instructions  Patient Details  Name: Darius Norris MRN: [id number] Date of Birth: 02/08/1951 Referring Provider:  Vernelle Emerald, MD Below are your personal goals for exercise, nutrition, and risk factors. Our goal is to help you stay on track towards obtaining and maintaining these goals. We will be discussing your progress on these goals with you throughout the program. Initial Exercise Prescription: Initial Exercise Prescription - 12/23/18 1500      Date of Initial Exercise RX and Referring Provider   Date  12/23/18    Referring Provider  Vernelle Emerald MD      Oxygen   Oxygen  Continuous    Liters  3      Treadmill   MPH  1    Grade  0.5    Minutes  15    METs  1.83      NuStep   Level  1    SPM  80    Minutes  15    METs  1.8      REL-XR   Level  1    Speed  50    Minutes  15    METs  1.8      Prescription Details   Frequency (times per week)  3    Duration  Progress to 45 minutes of aerobic exercise without signs/symptoms of physical distress      Intensity   THRR 40-80% of Max Heartrate  101-136    Ratings of Perceived Exertion  11-13    Perceived Dyspnea  0-4      Progression   Progression  Continue to progress workloads to maintain intensity without signs/symptoms of physical distress.      Resistance Training   Training Prescription  Yes    Weight  3 lbs    Reps  10-15      Exercise Goals: Frequency: Be able to perform aerobic exercise two to three times per week in program working toward 2-5 days per week of home exercise. Intensity: Work with a perceived exertion of 11 (fairly light) - 15 (hard) while following your exercise prescription.  We will make changes to your prescription with you as you progress through the program. Duration: Be able to do 30 to 45 minutes of continuous aerobic exercise in addition to a 5 minute warm-up and a 5 minute cool-down routine. Nutrition Goals: Your personal nutrition goals will be established when you  do your nutrition analysis with the dietician. The following are general nutrition guidelines to follow: Cholesterol < 200mg /day Sodium < 1500mg /day Fiber: Men over 50 yrs - 30 grams per day Personal Goals: Personal Goals and Risk Factors at Admission - 12/23/18 1418      Core Components/Risk Factors/Patient Goals on Admission    Weight Management  Yes;Weight Loss    Intervention  Weight Management: Develop a combined nutrition and exercise program designed to reach desired caloric intake, while maintaining appropriate intake of nutrient and fiber, sodium and fats, and appropriate energy expenditure required for the weight goal.;Weight Management: Provide education and appropriate resources to help participant work on and attain dietary goals.    Admit Weight  209 lb 6.4 oz (95 kg)    Goal Weight: Short Term  204 lb (92.5 kg)    Goal Weight: Long Term  180 lb (81.6 kg)    Expected Outcomes  Short Term: Continue to assess and modify interventions until short term weight is achieved;Long Term: Adherence to nutrition and physical activity/exercise program aimed toward attainment  of established weight goal;Weight Loss: Understanding of general recommendations for a balanced deficit meal plan, which promotes 1-2 lb weight loss per week and includes a negative energy balance of [phone] kcal/d;Understanding recommendations for meals to include 15-35% energy as protein, 25-35% energy from fat, 35-60% energy from carbohydrates, less than 200mg  of dietary cholesterol, 20-35 gm of total fiber daily;Understanding of distribution of calorie intake throughout the day with the consumption of 4-5 meals/snacks    Improve shortness of breath with ADL's  Yes    Intervention  Provide education, individualized exercise plan and daily activity instruction to help decrease symptoms of SOB with activities of daily living.    Expected Outcomes  Short Term: Improve cardiorespiratory fitness to achieve a reduction of  symptoms when performing ADLs;Long Term: Be able to perform more ADLs without symptoms or delay the onset of symptoms    Hypertension  Yes   AFIB   Intervention  Provide education on lifestyle modifcations including regular physical activity/exercise, weight management, moderate sodium restriction and increased consumption of fresh fruit, vegetables, and low fat dairy, alcohol moderation, and smoking cessation.;Monitor prescription use compliance.    Expected Outcomes  Short Term: Continued assessment and intervention until BP is < 140/58mm HG in hypertensive participants. < 130/43mm HG in hypertensive participants with diabetes, heart failure or chronic kidney disease.;Long Term: Maintenance of blood pressure at goal levels.    Lipids  Yes    Intervention  Provide education and support for participant on nutrition & aerobic/resistive exercise along with prescribed medications to achieve LDL 70mg , HDL >40mg .    Expected Outcomes  Short Term: Participant states understanding of desired cholesterol values and is compliant with medications prescribed. Participant is following exercise prescription and nutrition guidelines.;Long Term: Cholesterol controlled with medications as prescribed, with individualized exercise RX and with personalized nutrition plan. Value goals: LDL < 70mg , HDL > 40 mg.      Tobacco Use Initial Evaluation: Social History   Tobacco Use  Smoking Status Former Smoker  . Packs/day: 1.00  . Years: 30.00  . Pack years: 30.00  . Types: Cigarettes  . Last attempt to quit: 12/18/2001  . Years since quitting: 17.0  Smokeless Tobacco Never Used   Exercise Goals and Review: Exercise Goals    [place] Name 12/23/18 1554             Exercise Goals   Increase Physical Activity  Yes       Intervention  Provide advice, education, support and counseling about physical activity/exercise needs.;Develop an individualized exercise prescription for aerobic and resistive training based on  initial evaluation findings, risk stratification, comorbidities and participant's personal goals.       Expected Outcomes  Short Term: Attend rehab on a regular basis to increase amount of physical activity.;Long Term: Add in home exercise to make exercise part of routine and to increase amount of physical activity.;Long Term: Exercising regularly at least 3-5 days a week.       Increase Strength and Stamina  Yes       Intervention  Provide advice, education, support and counseling about physical activity/exercise needs.;Develop an individualized exercise prescription for aerobic and resistive training based on initial evaluation findings, risk stratification, comorbidities and participant's personal goals.       Expected Outcomes  Short Term: Increase workloads from initial exercise prescription for resistance, speed, and METs.;Short Term: Perform resistance training exercises routinely during rehab and add in resistance training at home;Long Term: Improve cardiorespiratory fitness, muscular endurance and strength as  measured by increased METs and functional capacity (6MWT)       Able to understand and use rate of perceived exertion (RPE) scale  Yes       Intervention  Provide education and explanation on how to use RPE scale       Expected Outcomes  Short Term: Able to use RPE daily in rehab to express subjective intensity level;Long Term:  Able to use RPE to guide intensity level when exercising independently       Able to understand and use Dyspnea scale  Yes       Intervention  Provide education and explanation on how to use Dyspnea scale       Expected Outcomes  Short Term: Able to use Dyspnea scale daily in rehab to express subjective sense of shortness of breath during exertion;Long Term: Able to use Dyspnea scale to guide intensity level when exercising independently       Knowledge and understanding of Target Heart Rate Range (THRR)  Yes       Intervention  Provide education and explanation of  THRR including how the numbers were predicted and where they are located for reference       Expected Outcomes  Short Term: Able to state/look up THRR;Short Term: Able to use daily as guideline for intensity in rehab;Long Term: Able to use THRR to govern intensity when exercising independently       Able to check pulse independently  Yes       Intervention  Provide education and demonstration on how to check pulse in carotid and radial arteries.;Review the importance of being able to check your own pulse for safety during independent exercise       Expected Outcomes  Short Term: Able to explain why pulse checking is important during independent exercise;Long Term: Able to check pulse independently and accurately       Understanding of Exercise Prescription  Yes       Intervention  Provide education, explanation, and written materials on patient's individual exercise prescription       Expected Outcomes  Short Term: Able to explain program exercise prescription;Long Term: Able to explain home exercise prescription to exercise independently          Copy of goals given to participant.

## 2018-12-30 DIAGNOSIS — J449 Chronic obstructive pulmonary disease, unspecified: Secondary | ICD-10-CM | POA: Diagnosis not present

## 2018-12-30 NOTE — Progress Notes (Signed)
Pulmonary Individual Treatment Plan  Patient Details  Name: Darius Norris MRN: [id number] Date of Birth: [birth year] Referring Provider:     Pulmonary Rehab from 12/23/2018 in [facility] Cardiac and Pulmonary Rehab  Referring Provider  Vernelle Emerald MD      Initial Encounter Date:    Pulmonary Rehab from 12/23/2018 in [facility] Cardiac and Pulmonary Rehab  Date  12/23/18      Visit Diagnosis: Chronic obstructive pulmonary disease, unspecified COPD type ([place])  Patient's Home Medications on Admission:  Current Outpatient Medications:  .  acetaminophen (TYLENOL) 325 MG tablet, Take 650 mg by mouth every 6 (six) hours as needed. , Disp: , Rfl:  .  amitriptyline (ELAVIL) 10 MG tablet, Take 15 mg by mouth at bedtime. , Disp: , Rfl:  .  aspirin EC 81 MG EC tablet, Take 1 tablet (81 mg total) by mouth daily., Disp: 180 tablet, Rfl: 0 .  atorvastatin (LIPITOR) 80 MG tablet, Take 1 tablet (80 mg total) by mouth at bedtime., Disp: 30 tablet, Rfl: 0 .  budesonide-formoterol (SYMBICORT) 160-4.5 MCG/ACT inhaler, Inhale 2 puffs into the lungs 2 (two) times daily., Disp: , Rfl:  .  busPIRone (BUSPAR) 10 MG tablet, Take 10 mg by mouth 2 (two) times daily., Disp: , Rfl:  .  clobetasol cream (TEMOVATE) 5.43 %, Apply 1 application topically daily as needed. For itching on psoriasis areas, Disp: , Rfl:  .  dabigatran (PRADAXA) 150 MG CAPS capsule, Take 150 mg by mouth 2 (two) times daily., Disp: , Rfl:  .  diltiazem (CARDIZEM CD) 180 MG 24 hr capsule, Take 1 capsule (180 mg total) by mouth daily., Disp: 90 capsule, Rfl: 0 .  fluticasone (FLONASE) 50 MCG/ACT nasal spray, Place 2 sprays into both nostrils daily., Disp: , Rfl:  .  gabapentin (NEURONTIN) 100 MG capsule, Take 100 mg by mouth at bedtime., Disp: , Rfl:  .  Ipratropium-Albuterol (COMBIVENT RESPIMAT) 20-100 MCG/ACT AERS respimat, Inhale 1 puff into the lungs every 6 (six) hours as needed for wheezing or shortness of breath., Disp: , Rfl:  .  isosorbide  mononitrate (IMDUR) 60 MG 24 hr tablet, Take 60 mg by mouth daily., Disp: , Rfl:  .  lisinopril (PRINIVIL,ZESTRIL) 40 MG tablet, Take 20 mg by mouth daily. , Disp: , Rfl:  .  loratadine (CLARITIN) 10 MG tablet, Take 10 mg by mouth daily as needed for allergies. , Disp: , Rfl:  .  metoprolol tartrate (LOPRESSOR) 100 MG tablet, Take 1 tablet (100 mg total) by mouth 2 (two) times daily., Disp: 60 tablet, Rfl: 0 .  omeprazole (PRILOSEC) 20 MG capsule, Take 20 mg by mouth daily. , Disp: , Rfl:  .  potassium chloride (K-DUR,KLOR-CON) 10 MEQ tablet, Take 10 mEq by mouth daily., Disp: , Rfl:  .  senna (SENOKOT) 8.6 MG TABS tablet, Take 1 tablet by mouth daily as needed for mild constipation., Disp: , Rfl:  .  traZODone (DESYREL) 50 MG tablet, Take 1 tablet (50 mg total) by mouth at bedtime as needed for sleep., Disp: 30 tablet, Rfl: 0  Past Medical History: Past Medical History:  Diagnosis Date  . A-fib ([place])   . CAD (coronary artery disease)   . Colitis   . COPD (chronic obstructive pulmonary disease) ([place])   . Emphysema lung ([place])   . GERD (gastroesophageal reflux disease)   . Hypertension   . MI (myocardial infarction) ([place]) 1999    Tobacco Use: Social History   Tobacco Use  Smoking Status Former  Smoker  . Packs/day: 1.00  . Years: 30.00  . Pack years: 30.00  . Types: Cigarettes  . Last attempt to quit: 12/18/2001  . Years since quitting: 17.0  Smokeless Tobacco Never Used    Labs: Recent Chemical engineer    Labs for ITP Cardiac and Pulmonary Rehab Latest Ref Rng & Units 02/04/2013 09/04/2016 05/02/2018   Cholestrol 0 - 200 mg/dL 185 116 -   LDLCALC 0 - 99 mg/dL 114(H) 51 -   HDL >40 mg/dL 38(L) 42 -   Trlycerides <150 mg/dL 166 116 -   Hemoglobin A1c 4.8 - 5.6 % - 6.4(H) -   HCO3 20.0 - 28.0 mmol/L - - 36.3(H)       Pulmonary Assessment Scores: Pulmonary Assessment Scores    Row Name 12/23/18 1425         ADL UCSD   ADL Phase  Entry     SOB Score total  90      Rest  3     Walk  4     Stairs  5     Bath  4     Dress  2     Shop  3       CAT Score   CAT Score  24       mMRC Score   mMRC Score  2        Pulmonary Function Assessment: Pulmonary Function Assessment - 12/23/18 1423      Post Bronchodilator Spirometry Results   Comments  results taken from 06/27/16      Breath   Bilateral Breath Sounds  Clear    Shortness of Breath  Yes;Fear of Shortness of Breath;Limiting activity       Exercise Target Goals: Exercise Program Goal: Individual exercise prescription set using results from initial 6 min walk test and THRR while considering  patient's activity barriers and safety.   Exercise Prescription Goal: Initial exercise prescription builds to 30-45 minutes a day of aerobic activity, 2-3 days per week.  Home exercise guidelines will be given to patient during program as part of exercise prescription that the participant will acknowledge.  Activity Barriers & Risk Stratification: Activity Barriers & Cardiac Risk Stratification - 12/23/18 1517      Activity Barriers & Cardiac Risk Stratification   Activity Barriers  Shortness of Breath;Deconditioning;Back Problems;Muscular Weakness;Balance Concerns;Assistive Device   chronic back pain      6 Minute Walk: 6 Minute Walk    Row Name 12/23/18 1510         6 Minute Walk   Phase  Initial     Distance  523 feet     Walk Time  3.92 minutes     # of Rest Breaks  2 63 sec; 62 sec     MPH  1.52     METS  1.57     RPE  11     Perceived Dyspnea   3     VO2 Peak  5.49     Symptoms  Yes (comment)     Comments  SOB; back pain 6/10     Resting HR  67 bpm     Resting BP  126/64     Resting Oxygen Saturation   94 %     Exercise Oxygen Saturation  during 6 min walk  82 %     Max Ex. HR  99 bpm     Max Ex. BP  164/62     2  Minute Post BP  140/66       Interval HR   1 Minute HR  94     2 Minute HR  92     3 Minute HR  91     4 Minute HR  99     5 Minute HR  93     6 Minute HR   98     2 Minute Post HR  81     Interval Heart Rate?  Yes       Interval Oxygen   Interval Oxygen?  Yes     Baseline Oxygen Saturation %  94 %     1 Minute Oxygen Saturation %  86 % seated rest 1:17-2:20     1 Minute Liters of Oxygen  2 L     2 Minute Oxygen Saturation %  82 %     2 Minute Liters of Oxygen  2 L     3 Minute Oxygen Saturation %  88 % 3:43-4:45     3 Minute Liters of Oxygen  2 L     4 Minute Oxygen Saturation %  82 %     4 Minute Liters of Oxygen  2 L     5 Minute Oxygen Saturation %  90 %     5 Minute Liters of Oxygen  2 L     6 Minute Oxygen Saturation %  83 %     6 Minute Liters of Oxygen  2 L     2 Minute Post Oxygen Saturation %  93 %     2 Minute Post Liters of Oxygen  2 L       Oxygen Initial Assessment: Oxygen Initial Assessment - 12/23/18 1413      Home Oxygen   Home Oxygen Device  Home Concentrator;E-Tanks    Sleep Oxygen Prescription  Continuous    Liters per minute  2    Home Exercise Oxygen Prescription  Continuous    Liters per minute  2    Home at Rest Exercise Oxygen Prescription  Continuous    Liters per minute  2    Compliance with Home Oxygen Use  Yes      Initial 6 min Walk   Oxygen Used  Continuous    Liters per minute  2      Program Oxygen Prescription   Program Oxygen Prescription  Continuous    Liters per minute  2      Intervention   Short Term Goals  To learn and exhibit compliance with exercise, home and travel O2 prescription;To learn and understand importance of monitoring SPO2 with pulse oximeter and demonstrate accurate use of the pulse oximeter.;To learn and understand importance of maintaining oxygen saturations>88%;To learn and demonstrate proper pursed lip breathing techniques or other breathing techniques.;To learn and demonstrate proper use of respiratory medications    Long  Term Goals  Exhibits compliance with exercise, home and travel O2 prescription;Verbalizes importance of monitoring SPO2 with pulse oximeter and  return demonstration;Maintenance of O2 saturations>88%;Exhibits proper breathing techniques, such as pursed lip breathing or other method taught during program session;Compliance with respiratory medication;Demonstrates proper use of MDI's       Oxygen Re-Evaluation:   Oxygen Discharge (Final Oxygen Re-Evaluation):   Initial Exercise Prescription: Initial Exercise Prescription - 12/23/18 1500      Date of Initial Exercise RX and Referring Provider   Date  12/23/18    Referring Provider  Vernelle Emerald MD  Oxygen   Oxygen  Continuous    Liters  3      Treadmill   MPH  1    Grade  0.5    Minutes  15    METs  1.83      NuStep   Level  1    SPM  80    Minutes  15    METs  1.8      REL-XR   Level  1    Speed  50    Minutes  15    METs  1.8      Prescription Details   Frequency (times per week)  3    Duration  Progress to 45 minutes of aerobic exercise without signs/symptoms of physical distress      Intensity   THRR 40-80% of Max Heartrate  101-136    Ratings of Perceived Exertion  11-13    Perceived Dyspnea  0-4      Progression   Progression  Continue to progress workloads to maintain intensity without signs/symptoms of physical distress.      Resistance Training   Training Prescription  Yes    Weight  3 lbs    Reps  10-15       Perform Capillary Blood Glucose checks as needed.  Exercise Prescription Changes: Exercise Prescription Changes    Row Name 12/23/18 1500             Response to Exercise   Blood Pressure (Admit)  126/64       Blood Pressure (Exercise)  164/62       Blood Pressure (Exit)  140/66       Heart Rate (Admit)  67 bpm       Heart Rate (Exercise)  99 bpm       Heart Rate (Exit)  81 bpm       Oxygen Saturation (Admit)  94 %       Oxygen Saturation (Exercise)  82 %       Oxygen Saturation (Exit)  93 %       Rating of Perceived Exertion (Exercise)  17       Perceived Dyspnea (Exercise)  3       Symptoms  back pain 6/10;  SOB       Comments  walk test results          Exercise Comments:   Exercise Goals and Review: Exercise Goals    Row Name 12/23/18 1554             Exercise Goals   Increase Physical Activity  Yes       Intervention  Provide advice, education, support and counseling about physical activity/exercise needs.;Develop an individualized exercise prescription for aerobic and resistive training based on initial evaluation findings, risk stratification, comorbidities and participant's personal goals.       Expected Outcomes  Short Term: Attend rehab on a regular basis to increase amount of physical activity.;Long Term: Add in home exercise to make exercise part of routine and to increase amount of physical activity.;Long Term: Exercising regularly at least 3-5 days a week.       Increase Strength and Stamina  Yes       Intervention  Provide advice, education, support and counseling about physical activity/exercise needs.;Develop an individualized exercise prescription for aerobic and resistive training based on initial evaluation findings, risk stratification, comorbidities and participant's personal goals.       Expected Outcomes  Short Term: Increase  workloads from initial exercise prescription for resistance, speed, and METs.;Short Term: Perform resistance training exercises routinely during rehab and add in resistance training at home;Long Term: Improve cardiorespiratory fitness, muscular endurance and strength as measured by increased METs and functional capacity (6MWT)       Able to understand and use rate of perceived exertion (RPE) scale  Yes       Intervention  Provide education and explanation on how to use RPE scale       Expected Outcomes  Short Term: Able to use RPE daily in rehab to express subjective intensity level;Long Term:  Able to use RPE to guide intensity level when exercising independently       Able to understand and use Dyspnea scale  Yes       Intervention  Provide  education and explanation on how to use Dyspnea scale       Expected Outcomes  Short Term: Able to use Dyspnea scale daily in rehab to express subjective sense of shortness of breath during exertion;Long Term: Able to use Dyspnea scale to guide intensity level when exercising independently       Knowledge and understanding of Target Heart Rate Range (THRR)  Yes       Intervention  Provide education and explanation of THRR including how the numbers were predicted and where they are located for reference       Expected Outcomes  Short Term: Able to state/look up THRR;Short Term: Able to use daily as guideline for intensity in rehab;Long Term: Able to use THRR to govern intensity when exercising independently       Able to check pulse independently  Yes       Intervention  Provide education and demonstration on how to check pulse in carotid and radial arteries.;Review the importance of being able to check your own pulse for safety during independent exercise       Expected Outcomes  Short Term: Able to explain why pulse checking is important during independent exercise;Long Term: Able to check pulse independently and accurately       Understanding of Exercise Prescription  Yes       Intervention  Provide education, explanation, and written materials on patient's individual exercise prescription       Expected Outcomes  Short Term: Able to explain program exercise prescription;Long Term: Able to explain home exercise prescription to exercise independently          Exercise Goals Re-Evaluation :   Discharge Exercise Prescription (Final Exercise Prescription Changes): Exercise Prescription Changes - 12/23/18 1500      Response to Exercise   Blood Pressure (Admit)  126/64    Blood Pressure (Exercise)  164/62    Blood Pressure (Exit)  140/66    Heart Rate (Admit)  67 bpm    Heart Rate (Exercise)  99 bpm    Heart Rate (Exit)  81 bpm    Oxygen Saturation (Admit)  94 %    Oxygen Saturation  (Exercise)  82 %    Oxygen Saturation (Exit)  93 %    Rating of Perceived Exertion (Exercise)  17    Perceived Dyspnea (Exercise)  3    Symptoms  back pain 6/10; SOB    Comments  walk test results       Nutrition:  Target Goals: Understanding of nutrition guidelines, daily intake of sodium <1513m, cholesterol <2012m calories 30% from fat and 7% or less from saturated fats, daily to have 5 or more servings of  fruits and vegetables.  Biometrics: Pre Biometrics - 12/23/18 1554      Pre Biometrics   Height  5' 6" (1.676 m)    Weight  209 lb 6.4 oz (95 kg)    Waist Circumference  43 inches    Hip Circumference  47 inches    Waist to Hip Ratio  0.91 %    BMI (Calculated)  33.81    Single Leg Stand  1.41 seconds        Nutrition Therapy Plan and Nutrition Goals: Nutrition Therapy & Goals - 12/23/18 1416      Personal Nutrition Goals   Nutrition Goal  Lose weight    Personal Goal #2  Learn how to eat healthier    Comments  He has been gaining some weight due to prednisone.      Intervention Plan   Intervention  Prescribe, educate and counsel regarding individualized specific dietary modifications aiming towards targeted core components such as weight, hypertension, lipid management, diabetes, heart failure and other comorbidities.;Nutrition handout(s) given to patient.    Expected Outcomes  Short Term Goal: Understand basic principles of dietary content, such as calories, fat, sodium, cholesterol and nutrients.;Long Term Goal: Adherence to prescribed nutrition plan.       Nutrition Assessments:   Nutrition Goals Re-Evaluation:   Nutrition Goals Discharge (Final Nutrition Goals Re-Evaluation):   Psychosocial: Target Goals: Acknowledge presence or absence of significant depression and/or stress, maximize coping skills, provide positive support system. Participant is able to verbalize types and ability to use techniques and skills needed for reducing stress and depression.    Initial Review & Psychosocial Screening: Initial Psych Review & Screening - 12/23/18 1415      Initial Review   Current issues with  Current Anxiety/Panic;Current Stress Concerns    Source of Stress Concerns  Chronic Illness    Comments  Sometimes has panic issues with shortness of breath.      Family Dynamics   Good Support System?  Yes    Comments  He can look to his wife and his two sisters for support.      Barriers   Psychosocial barriers to participate in program  The patient should benefit from training in stress management and relaxation.      Screening Interventions   Interventions  Encouraged to exercise;Program counselor consult;To provide support and resources with identified psychosocial needs;Provide feedback about the scores to participant    Expected Outcomes  Short Term goal: Utilizing psychosocial counselor, staff and physician to assist with identification of specific Stressors or current issues interfering with healing process. Setting desired goal for each stressor or current issue identified.;Long Term Goal: Stressors or current issues are controlled or eliminated.;Short Term goal: Identification and review with participant of any Quality of Life or Depression concerns found by scoring the questionnaire.;Long Term goal: The participant improves quality of Life and PHQ9 Scores as seen by post scores and/or verbalization of changes       Quality of Life Scores:  Scores of 19 and below usually indicate a poorer quality of life in these areas.  A difference of  2-3 points is a clinically meaningful difference.  A difference of 2-3 points in the total score of the Quality of Life Index has been associated with significant improvement in overall quality of life, self-image, physical symptoms, and general health in studies assessing change in quality of life.  PHQ-9: Recent Review Flowsheet Data    Depression screen [facility] 2/9 12/23/2018 11/20/2016 06/27/2016  Decreased  Interest 2 1 0   Down, Depressed, Hopeless 0 1 0   PHQ - 2 Score 2 2 0   Altered sleeping _0 Tired, decreased energy _1 Change in appetite _2 Feeling bad or failure about yourself  0 0 0   Trouble concentrating 0 0 0   Moving slowly or fidgety/restless 1 0 0   Suicidal thoughts 0 0 0   PHQ-9 Score _3 Difficult doing work/chores Very difficult Very difficult Somewhat difficult     Interpretation of Total Score  Total Score Depression Severity:  1-4 = Minimal depression, 5-9 = Mild depression, 10-14 = Moderate depression, 15-19 = Moderately severe depression, 20-27 = Severe depression   Psychosocial Evaluation and Intervention:   Psychosocial Re-Evaluation:   Psychosocial Discharge (Final Psychosocial Re-Evaluation):   Education: Education Goals: Education classes will be provided on a weekly basis, covering required topics. Participant will state understanding/return demonstration of topics presented.  Learning Barriers/Preferences: Learning Barriers/Preferences - 12/23/18 1417      Learning Barriers/Preferences   Learning Barriers  None    Learning Preferences  Group Instruction;Individual Instruction;Pictoral;Skilled Demonstration;Verbal Instruction;Video;Written Material       Education Topics:  Initial Evaluation Education: - Verbal, written and demonstration of respiratory meds, oximetry and breathing techniques. Instruction on use of nebulizers and MDIs and importance of monitoring MDI activations.   Pulmonary Rehab from 12/23/2018 in [facility] Cardiac and Pulmonary Rehab  Date  12/23/18  Educator  [facility]  Instruction Review Code  1- Verbalizes Understanding      General Nutrition Guidelines/Fats and Fiber: -Group instruction provided by verbal, written material, models and posters to present the general guidelines for heart healthy nutrition. Gives an explanation and review of dietary fats and fiber.   Controlling Sodium/Reading Food  Labels: -Group verbal and written material supporting the discussion of sodium use in heart healthy nutrition. Review and explanation with models, verbal and written materials for utilization of the food label.   Exercise Physiology & General Exercise Guidelines: - Group verbal and written instruction with models to review the exercise physiology of the cardiovascular system and associated critical values. Provides general exercise guidelines with specific guidelines to those with heart or lung disease.    Aerobic Exercise & Resistance Training: - Gives group verbal and written instruction on the various components of exercise. Focuses on aerobic and resistive training programs and the benefits of this training and how to safely progress through these programs.   Pulmonary Rehab from 04/21/2015 in [place]  Date  04/21/15  Educator  [place]  Instruction Review Code (retired)  2- meets Programmer, multimedia, Balance, Mind/Body Relaxation: Provides group verbal/written instruction on the benefits of flexibility and balance training, including mind/body exercise modes such as yoga, pilates and tai chi.  Demonstration and skill practice provided.   Pulmonary Rehab from 04/21/2015 in [place]  Date  04/21/15  Educator  [place]  Instruction Review Code (retired)  2- meets goals/outcomes      Stress and Anxiety: - Provides group verbal and written instruction about the health risks of elevated stress and causes of high stress.  Discuss the correlation between heart/lung disease and anxiety and treatment options. Review healthy ways to manage with stress and anxiety.   Depression: - Provides group verbal and written instruction on the correlation between heart/lung disease and  depressed mood, treatment options, and the stigmas associated with seeking treatment.   Exercise & Equipment Safety: -  Individual verbal instruction and demonstration of equipment use and safety with use of the equipment.   Pulmonary Rehab from 12/23/2018 in [facility] Cardiac and Pulmonary Rehab  Date  12/23/18  Educator  [facility]  Instruction Review Code  1- Verbalizes Understanding      Infection Prevention: - Provides verbal and written material to individual with discussion of infection control including proper hand washing and proper equipment cleaning during exercise session.   Pulmonary Rehab from 12/23/2018 in [facility] Cardiac and Pulmonary Rehab  Date  12/23/18  Educator  [facility]  Instruction Review Code  1- Verbalizes Understanding      Falls Prevention: - Provides verbal and written material to individual with discussion of falls prevention and safety.   Pulmonary Rehab from 12/23/2018 in [facility] Cardiac and Pulmonary Rehab  Date  12/23/18  Educator  [facility]  Instruction Review Code  1- Verbalizes Understanding      Diabetes: - Individual verbal and written instruction to review signs/symptoms of diabetes, desired ranges of glucose level fasting, after meals and with exercise. Advice that pre and post exercise glucose checks will be done for 3 sessions at entry of program.   Chronic Lung Diseases: - Group verbal and written instruction to review updates, respiratory medications, advancements in procedures and treatments. Discuss use of supplemental oxygen including available portable oxygen systems, continuous and intermittent flow rates, concentrators, personal use and safety guidelines. Review proper use of inhaler and spacers. Provide informative websites for self-education.    Pulmonary Rehab from 04/26/2015 in [place]  Date  04/26/15  Educator  [facility]  Instruction Review Code (retired)  2- meets Chief Financial Officer: - Provide group verbal and written instruction for methods to conserve energy, plan and organize activities. Instruct on pacing techniques, use  of adaptive equipment and posture/positioning to relieve shortness of breath.   Pulmonary Rehab from 05/24/2015 in [place]  Date  05/12/15  Educator  [facility]  Instruction Review Code (retired)  2- meets goals/outcomes      Triggers and Exacerbations: - Group verbal and written instruction to review types of environmental triggers and ways to prevent exacerbations. Discuss weather changes, air quality and the benefits of nasal washing. Review warning signs and symptoms to help prevent infections. Discuss techniques for effective airway clearance, coughing, and vibrations.   AED/CPR: - Group verbal and written instruction with the use of models to demonstrate the basic use of the AED with the basic ABC's of resuscitation.   Anatomy and Physiology of the Lungs: - Group verbal and written instruction with the use of models to provide basic lung anatomy and physiology related to function, structure and complications of lung disease.   Anatomy & Physiology of the Heart: - Group verbal and written instruction and models provide basic cardiac anatomy and physiology, with the coronary electrical and arterial systems. Review of Valvular disease and Heart Failure   Cardiac Medications: - Group verbal and written instruction to review commonly prescribed medications for heart disease. Reviews the medication, class of the drug, and side effects.   Know Your Numbers and Risk Factors: -Group verbal and written instruction about important numbers in your health.  Discussion of what are risk factors and how they play a role in the disease process.  Review of Cholesterol, Blood Pressure, Diabetes, and BMI and the role  they play in your overall health.   Sleep Hygiene: -Provides group verbal and written instruction about how sleep can affect your health.  Define sleep hygiene, discuss sleep cycles and impact of sleep habits. Review good sleep hygiene tips.     Other: -Provides group and verbal instruction on various topics (see comments)    Knowledge Questionnaire Score: Knowledge Questionnaire Score - 12/23/18 1423      Knowledge Questionnaire Score   Pre Score  16/18   reviewed with patient       Core Components/Risk Factors/Patient Goals at Admission: Personal Goals and Risk Factors at Admission - 12/23/18 1418      Core Components/Risk Factors/Patient Goals on Admission    Weight Management  Yes;Weight Loss    Intervention  Weight Management: Develop a combined nutrition and exercise program designed to reach desired caloric intake, while maintaining appropriate intake of nutrient and fiber, sodium and fats, and appropriate energy expenditure required for the weight goal.;Weight Management: Provide education and appropriate resources to help participant work on and attain dietary goals.    Admit Weight  209 lb 6.4 oz (95 kg)    Goal Weight: Short Term  204 lb (92.5 kg)    Goal Weight: Long Term  180 lb (81.6 kg)    Expected Outcomes  Short Term: Continue to assess and modify interventions until short term weight is achieved;Long Term: Adherence to nutrition and physical activity/exercise program aimed toward attainment of established weight goal;Weight Loss: Understanding of general recommendations for a balanced deficit meal plan, which promotes 1-2 lb weight loss per week and includes a negative energy balance of [id number] kcal/d;Understanding recommendations for meals to include 15-35% energy as protein, 25-35% energy from fat, 35-60% energy from carbohydrates, less than 25m of dietary cholesterol, 20-35 gm of total fiber daily;Understanding of distribution of calorie intake throughout the day with the consumption of 4-5 meals/snacks    Improve shortness of breath with ADL's  Yes    Intervention  Provide education, individualized exercise plan and daily activity instruction to help decrease symptoms of SOB with activities of daily  living.    Expected Outcomes  Short Term: Improve cardiorespiratory fitness to achieve a reduction of symptoms when performing ADLs;Long Term: Be able to perform more ADLs without symptoms or delay the onset of symptoms    Hypertension  Yes   AFIB   Intervention  Provide education on lifestyle modifcations including regular physical activity/exercise, weight management, moderate sodium restriction and increased consumption of fresh fruit, vegetables, and low fat dairy, alcohol moderation, and smoking cessation.;Monitor prescription use compliance.    Expected Outcomes  Short Term: Continued assessment and intervention until BP is < 140/951mHG in hypertensive participants. < 130/8054mG in hypertensive participants with diabetes, heart failure or chronic kidney disease.;Long Term: Maintenance of blood pressure at goal levels.    Lipids  Yes    Intervention  Provide education and support for participant on nutrition & aerobic/resistive exercise along with prescribed medications to achieve LDL <33m82mDL >40mg55m Expected Outcomes  Short Term: Participant states understanding of desired cholesterol values and is compliant with medications prescribed. Participant is following exercise prescription and nutrition guidelines.;Long Term: Cholesterol controlled with medications as prescribed, with individualized exercise RX and with personalized nutrition plan. Value goals: LDL < 33mg,45m > 40 mg.       Core Components/Risk Factors/Patient Goals Review:    Core Components/Risk Factors/Patient Goals at Discharge (Final Review):    ITP Comments: ITP Comments  [place] Name 12/23/18 1525 12/30/18 0849         ITP Comments  Medical Evaluation completed. Chart sent for review and changes to Dr. Emily Filbert Director of [place]. Diagnosis can be found in Care everywhere/media.  30 day review completed. ITP sent to Dr. Emily Filbert Director of [place]. Continue with ITP unless changes are made by  physician.         Comments: 30 day review

## 2018-12-30 NOTE — Progress Notes (Signed)
Daily Session Note  Patient Details  Name: Darius Norris MRN: [id number] Date of Birth: [birth year] Referring Provider:     Pulmonary Rehab from 12/23/2018 in [facility] Cardiac and Pulmonary Rehab  Referring Provider  Vernelle Emerald MD      Encounter Date: 12/30/2018  Check In:      Social History   Tobacco Use  Smoking Status Former Smoker  . Packs/day: 1.00  . Years: 30.00  . Pack years: 30.00  . Types: Cigarettes  . Last attempt to quit: 12/18/2001  . Years since quitting: 17.0  Smokeless Tobacco Never Used    Goals Met:  Proper associated with RPD/PD & O2 Sat Independence with exercise equipment Exercise tolerated well Strength training completed today  Goals Unmet:  Not Applicable  Comments: First full day of exercise!  Patient was oriented to gym and equipment including functions, settings, policies, and procedures.  Patient's individual exercise prescription and treatment plan were reviewed.  All starting workloads were established based on the results of the 6 minute walk test done at initial orientation visit.  The plan for exercise progression was also introduced and progression will be customized based on patient's performance and goals.    Dr. Emily Filbert is Medical Director for [place] and [name].

## 2019-01-01 ENCOUNTER — Encounter: Payer: No Typology Code available for payment source | Admitting: *Deleted

## 2019-01-01 DIAGNOSIS — J449 Chronic obstructive pulmonary disease, unspecified: Secondary | ICD-10-CM

## 2019-01-01 NOTE — Progress Notes (Signed)
Daily Session Note  Patient Details  Name: Darius Norris MRN: [id number] Date of Birth: 12/18/1950 Referring Provider:     Pulmonary Rehab from 12/23/2018 in [facility] Cardiac and Pulmonary Rehab  Referring Provider  Oddone, Eugene MD      Encounter Date: 01/01/2019  Check In: Session Check In - 01/01/19 1120      Check-In   Supervising physician immediately available to respond to emergencies  [name] immediately available ER MD    Physician(s)  Drs. Stafford and Williams    Location  [facility]-Cardiac & Pulmonary Rehab    Staff Present  Jessica Hawkins, MA, RCEP, CCRP, Exercise Physiologist;Joseph Hood RCP,RRT,BSRT;Meredith Craven, RN BSN    Medication changes reported      No    Fall or balance concerns reported     No    Warm-up and Cool-down  Performed as group-led instruction    Resistance Training Performed  Yes    VAD Patient?  No    PAD/SET Patient?  No      Pain Assessment   Currently in Pain?  No/denies          Social History   Tobacco Use  Smoking Status Former Smoker  . Packs/day: 1.00  . Years: 30.00  . Pack years: 30.00  . Types: Cigarettes  . Last attempt to quit: 12/18/2001  . Years since quitting: 17.0  Smokeless Tobacco Never Used    Goals Met:  Proper associated with RPD/PD & O2 Sat Independence with exercise equipment Using PLB without cueing & demonstrates good technique Exercise tolerated well No report of cardiac concerns or symptoms Strength training completed today  Goals Unmet:  Not Applicable  Comments: Pt able to follow exercise prescription today without complaint.  Will continue to monitor for progression.  Reviewed home exercise with pt today.  Pt plans to walking and ride his stationary bike for exercise.  Reviewed THR, pulse, RPE, sign and symptoms, and when to call 911 or MD.  Also discussed weather considerations and indoor options.  Pt voiced understanding.    Dr. Mark Miller is Medical Director for [facility] and [name]. 

## 2019-01-03 ENCOUNTER — Encounter: Payer: No Typology Code available for payment source | Admitting: *Deleted

## 2019-01-03 DIAGNOSIS — J449 Chronic obstructive pulmonary disease, unspecified: Secondary | ICD-10-CM

## 2019-01-03 NOTE — Progress Notes (Signed)
Daily Session Note  Patient Details  Name: Darius Norris MRN: [id number] Date of Birth: 07/14/1951 Referring Provider:     Pulmonary Rehab from 12/23/2018 in [facility] Cardiac and Pulmonary Rehab  Referring Provider  Vernelle Emerald MD      Encounter Date: 01/03/2019  Check In: Session Check In - 01/03/19 1145      Check-In   Supervising physician immediately available to respond to emergencies  [name] immediately available ER MD    Physician(s)  Dr. Corky Downs and Clearnce Hasten    Location  [facility]-Cardiac & Pulmonary Rehab    Staff Present  Renita Papa, RN BSN;Jessica Luan Pulling, MA, RCEP, CCRP, Exercise Physiologist;Joseph Tessie Fass RCP,RRT,BSRT    Medication changes reported      No    Fall or balance concerns reported     No    Warm-up and Cool-down  Performed as group-led instruction    Resistance Training Performed  Yes    VAD Patient?  No    PAD/SET Patient?  No      Pain Assessment   Currently in Pain?  No/denies          Social History   Tobacco Use  Smoking Status Former Smoker  . Packs/day: 1.00  . Years: 30.00  . Pack years: 30.00  . Types: Cigarettes  . Last attempt to quit: 12/18/2001  . Years since quitting: 17.0  Smokeless Tobacco Never Used    Goals Met:  Proper associated with RPD/PD & O2 Sat Independence with exercise equipment Exercise tolerated well No report of cardiac concerns or symptoms Strength training completed today  Goals Unmet:  Not Applicable  Comments: Pt able to follow exercise prescription today without complaint.  Will continue to monitor for progression.    Dr. Emily Filbert is Medical Director for [place] and [name].

## 2019-01-05 ENCOUNTER — Other Ambulatory Visit: Payer: Self-pay

## 2019-01-05 ENCOUNTER — Encounter: Payer: Self-pay | Admitting: Emergency Medicine

## 2019-01-05 ENCOUNTER — Emergency Department: Payer: Medicare HMO

## 2019-01-05 ENCOUNTER — Emergency Department
Admission: EM | Admit: 2019-01-05 | Discharge: 2019-01-05 | Disposition: A | Discharge status: Home / Self Care | Payer: Medicare HMO | Attending: Emergency Medicine | Admitting: Emergency Medicine

## 2019-01-05 DIAGNOSIS — R001 Bradycardia, unspecified: Secondary | ICD-10-CM | POA: Insufficient documentation

## 2019-01-05 DIAGNOSIS — Z79899 Other long term (current) drug therapy: Secondary | ICD-10-CM | POA: Insufficient documentation

## 2019-01-05 DIAGNOSIS — Z87891 Personal history of nicotine dependence: Secondary | ICD-10-CM | POA: Insufficient documentation

## 2019-01-05 DIAGNOSIS — Z7982 Long term (current) use of aspirin: Secondary | ICD-10-CM | POA: Insufficient documentation

## 2019-01-05 DIAGNOSIS — I10 Essential (primary) hypertension: Secondary | ICD-10-CM | POA: Diagnosis not present

## 2019-01-05 DIAGNOSIS — I2510 Atherosclerotic heart disease of native coronary artery without angina pectoris: Secondary | ICD-10-CM | POA: Insufficient documentation

## 2019-01-05 DIAGNOSIS — R0602 Shortness of breath: Secondary | ICD-10-CM | POA: Insufficient documentation

## 2019-01-05 DIAGNOSIS — R0789 Other chest pain: Secondary | ICD-10-CM

## 2019-01-05 DIAGNOSIS — J449 Chronic obstructive pulmonary disease, unspecified: Secondary | ICD-10-CM | POA: Insufficient documentation

## 2019-01-05 DIAGNOSIS — R079 Chest pain, unspecified: Secondary | ICD-10-CM | POA: Diagnosis present

## 2019-01-05 LAB — COMPREHENSIVE METABOLIC PANEL
ALT: 16 U/L (ref 0–44)
AST: 16 U/L (ref 15–41)
Albumin: 3.9 g/dL (ref 3.5–5.0)
Alkaline Phosphatase: 114 U/L (ref 38–126)
Anion gap: 7 (ref 5–15)
BUN: 13 mg/dL (ref 8–23)
CO2: 30 mmol/L (ref 22–32)
Calcium: 8.8 mg/dL — ABNORMAL LOW (ref 8.9–10.3)
Chloride: 99 mmol/L (ref 98–111)
Creatinine, Ser: 1.05 mg/dL (ref 0.61–1.24)
GFR calc non Af Amer: >60 mL/min (ref >60)
Glucose, Bld: 136 mg/dL — ABNORMAL HIGH (ref 70–99)
Potassium: 3.8 mmol/L (ref 3.5–5.1)
Sodium: 136 mmol/L (ref 135–145)
TOTAL PROTEIN: 7.3 g/dL (ref 6.5–8.1)
Total Bilirubin: 0.7 mg/dL (ref 0.3–1.2)

## 2019-01-05 LAB — BLOOD GAS, VENOUS
Acid-Base Excess: 3.4 mmol/L — ABNORMAL HIGH (ref 0.0–2.0)
Bicarbonate: 29.6 mmol/L — ABNORMAL HIGH (ref 20.0–28.0)
O2 Saturation: 81.7 %
PATIENT TEMPERATURE: 37
pCO2, Ven: 50 mmHg (ref 44.0–60.0)
pH, Ven: 7.38 (ref 7.250–7.430)
pO2, Ven: 47 mmHg — ABNORMAL HIGH (ref 32.0–45.0)

## 2019-01-05 LAB — CBC WITH DIFFERENTIAL/PLATELET
Abs Immature Granulocytes: 0.06 10*3/uL (ref 0.00–0.07)
Basophils Absolute: 0.1 10*3/uL (ref 0.0–0.1)
Basophils Relative: 1 %
EOS ABS: 0.2 10*3/uL (ref 0.0–0.5)
Eosinophils Relative: 1 %
HCT: 43.6 % (ref 39.0–52.0)
Hemoglobin: 14.1 g/dL (ref 13.0–17.0)
Immature Granulocytes: 1 %
Lymphocytes Relative: 22 %
Lymphs Abs: 2.5 10*3/uL (ref 0.7–4.0)
MCH: 30.5 pg (ref 26.0–34.0)
MCHC: 32.3 g/dL (ref 30.0–36.0)
MCV: 94.4 fL (ref 80.0–100.0)
MONO ABS: 0.8 10*3/uL (ref 0.1–1.0)
Monocytes Relative: 7 %
Neutro Abs: 7.7 10*3/uL (ref 1.7–7.7)
Neutrophils Relative %: 68 %
Platelets: 231 10*3/uL (ref 150–400)
RBC: 4.62 MIL/uL (ref 4.22–5.81)
RDW: 15.3 % (ref 11.5–15.5)
WBC: 11.2 10*3/uL — AB (ref 4.0–10.5)
nRBC: 0 % (ref 0.0–0.2)

## 2019-01-05 LAB — TROPONIN I
Troponin I: <0.03 ng/mL (ref <0.03)
Troponin I: <0.03 ng/mL (ref <0.03)

## 2019-01-05 LAB — BRAIN NATRIURETIC PEPTIDE: B Natriuretic Peptide: 83 pg/mL (ref 0.0–100.0)

## 2019-01-05 MED ORDER — IPRATROPIUM-ALBUTEROL 0.5-2.5 (3) MG/3ML IN SOLN
3.0000 mL | Freq: Once | RESPIRATORY_TRACT | Status: AC
  Administered 2019-01-05: 3 mL via RESPIRATORY_TRACT
  Filled 2019-01-05: qty 3

## 2019-01-05 MED ORDER — ASPIRIN 81 MG PO CHEW
162.0000 mg | CHEWABLE_TABLET | Freq: Once | ORAL | Status: AC
  Administered 2019-01-05: 162 mg via ORAL
  Filled 2019-01-05: qty 2

## 2019-01-05 NOTE — ED Provider Notes (Signed)
[facility] Emergency Department Provider Note  ____________________________________________   First MD Initiated Contact with Patient 01/05/19 [phone]     (approximate)  I have reviewed the triage vital signs and the nursing notes.   HISTORY  Chief Complaint Bradycardia and Chest Pain   HPI Darius Norris is a 68 y.o. male who comes to the emergency department after checking his heart rate at home and noting it was in the mid 40s.  He has a complex past medical history including severe COPD, atrial fibrillation, coronary artery disease along with hypertension.  He checks his heart rate with his pulse oximeter at home multiple times a day.  His bradycardia was asymptomatic.  He does feel mildly short of breath but this is chronic for him.  He uses several liters of oxygen at home chronically.  He has had no change in his medications recently.  He does take long-acting diltiazem along with metoprolol twice a day.  His symptoms were sudden onset mild in severity and nothing seems to make them better or worse.    Past Medical History:  Diagnosis Date  . A-fib ([place])   . CAD (coronary artery disease)   . Colitis   . COPD (chronic obstructive pulmonary disease) ([place])   . Emphysema lung ([place])   . GERD (gastroesophageal reflux disease)   . Hypertension   . MI (myocardial infarction) [facility]) 1999    Patient Active Problem List   Diagnosis Date Noted  . Acute hypoxemic respiratory failure ([place]) 05/28/2018  . A-fib ([place]) 05/02/2018  . Chest pain 11/17/2017  . Unstable angina ([place]) 09/04/2016  . Uncontrolled hypertension 09/04/2016  . Hypokalemia 09/04/2016  . Leukocytosis 09/04/2016  . Hyperglycemia 09/04/2016  . COPD exacerbation ([place]) 12/15/2015  . Elevated troponin 12/15/2015  . HTN (hypertension) 12/15/2015  . GERD (gastroesophageal reflux disease) 12/15/2015  . CAD (coronary artery disease) 12/15/2015  . Angina pectoris ([place]) 12/15/2015    Past  Surgical History:  Procedure Laterality Date  . ABDOMINAL HERNIA REPAIR Bilateral   . CHOLECYSTECTOMY    . COLONOSCOPY WITH PROPOFOL N/A 02/01/2018   Procedure: COLONOSCOPY WITH PROPOFOL;  Surgeon: Jonathon Bellows, MD;  Location: [facility] ENDOSCOPY;  Service: Gastroenterology;  Laterality: N/A;  . KNEE ARTHROSCOPY Bilateral     Prior to Admission medications   Medication Sig Start Date End Date Taking? Authorizing Provider  acetaminophen (TYLENOL) 325 MG tablet Take 650 mg by mouth every 6 (six) hours as needed.     [provider]  amitriptyline (ELAVIL) 10 MG tablet Take 15 mg by mouth at bedtime.     [provider]  aspirin EC 81 MG EC tablet Take 1 tablet (81 mg total) by mouth daily. 06/09/18   [name], Avel Peace, MD  atorvastatin (LIPITOR) 80 MG tablet Take 1 tablet (80 mg total) by mouth at bedtime. 11/18/17   Vaughan Basta, MD  budesonide-formoterol (SYMBICORT) 160-4.5 MCG/ACT inhaler Inhale 2 puffs into the lungs 2 (two) times daily.    [provider]  busPIRone (BUSPAR) 10 MG tablet Take 10 mg by mouth 2 (two) times daily.    [provider]  clobetasol cream (TEMOVATE) 7.10 % Apply 1 application topically daily as needed. For itching on psoriasis areas    [provider]  dabigatran (PRADAXA) 150 MG CAPS capsule Take 150 mg by mouth 2 (two) times daily.    [provider]  diltiazem (CARDIZEM CD) 180 MG 24 hr capsule Take 1 capsule (180 mg total) by  mouth daily. 06/09/18   [name], Avel Peace, MD  fluticasone (FLONASE) 50 MCG/ACT nasal spray Place 2 sprays into both nostrils daily.    [provider]  gabapentin (NEURONTIN) 100 MG capsule Take 100 mg by mouth at bedtime.    [provider]  Ipratropium-Albuterol (COMBIVENT RESPIMAT) 20-100 MCG/ACT AERS respimat Inhale 1 puff into the lungs every 6 (six) hours as needed for wheezing or shortness of breath.    [provider]  isosorbide mononitrate (IMDUR)  60 MG 24 hr tablet Take 60 mg by mouth daily.    [provider]  lisinopril (PRINIVIL,ZESTRIL) 40 MG tablet Take 20 mg by mouth daily.     [provider]  loratadine (CLARITIN) 10 MG tablet Take 10 mg by mouth daily as needed for allergies.     [provider]  metoprolol tartrate (LOPRESSOR) 100 MG tablet Take 1 tablet (100 mg total) by mouth 2 (two) times daily. 05/04/18   Fritzi Mandes, MD  omeprazole (PRILOSEC) 20 MG capsule Take 20 mg by mouth daily.     [provider]  potassium chloride (K-DUR,KLOR-CON) 10 MEQ tablet Take 10 mEq by mouth daily.    [provider]  senna (SENOKOT) 8.6 MG TABS tablet Take 1 tablet by mouth daily as needed for mild constipation.    [provider]  traZODone (DESYREL) 50 MG tablet Take 1 tablet (50 mg total) by mouth at bedtime as needed for sleep. 05/29/18   [name], Avel Peace, MD    Allergies Zantac [ranitidine hcl]; Codeine; Erythromycin; Morphine and related; and Vicodin [hydrocodone-acetaminophen]  Family History  Problem Relation Age of Onset  . CAD Father   . Stroke Father   . CAD Sister   . Stroke Brother     Social History Social History   Tobacco Use  . Smoking status: Former Smoker    Packs/day: 1.00    Years: 30.00    Pack years: 30.00    Types: Cigarettes    Last attempt to quit: 12/18/2001    Years since quitting: 17.0  . Smokeless tobacco: Never Used  Substance Use Topics  . Alcohol use: No  . Drug use: No    Review of Systems Constitutional: No fever/chills Eyes: No visual changes. ENT: No sore throat. Cardiovascular: Positive for chest pain. Respiratory: Positive for shortness of breath. Gastrointestinal: No abdominal pain.  No nausea, no vomiting.  No diarrhea.  No constipation. Genitourinary: Negative for dysuria. Musculoskeletal: Negative for back pain. Skin: Negative for rash. Neurological: Negative for headaches, focal weakness or  numbness.   ____________________________________________   PHYSICAL EXAM:  VITAL SIGNS: ED Triage Vitals  Enc Vitals Group     BP      Pulse      Resp      Temp      Temp src      SpO2      Weight      Height      Head Circumference      Peak Flow      Pain Score      Pain Loc      Pain Edu?      Excl. in [place]?     Constitutional: Alert and oriented x4 appears somewhat uncomfortable and sitting upright working to breathe a little bit Eyes: PERRL EOMI. Head: Atraumatic. Nose: No congestion/rhinnorhea. Mouth/Throat: No trismus Neck: No stridor.   Cardiovascular: Bradycardic regular rhythm no murmurs appreciated Respiratory: Creased respiratory effort with mild wheeze throughout  Gastrointestinal: Soft nontender Musculoskeletal: Legs are equal in size Neurologic:  Normal speech and language. No gross focal neurologic deficits are appreciated. Skin:  Skin is warm, dry and intact. No rash noted. Psychiatric: Mood and affect are normal. Speech and behavior are normal.    ____________________________________________   DIFFERENTIAL includes but not limited to  Dehydration, therapeutic misadventure, acute coronary syndrome, hyperkalemia ____________________________________________   LABS (all labs ordered are listed, but only abnormal results are displayed)  Labs Reviewed  BLOOD GAS, VENOUS - Abnormal; Notable for the following components:      Result Value   pO2, Ven 47.0 (*)    Bicarbonate 29.6 (*)    Acid-Base Excess 3.4 (*)    All other components within normal limits  COMPREHENSIVE METABOLIC PANEL - Abnormal; Notable for the following components:   Glucose, Bld 136 (*)    Calcium 8.8 (*)    All other components within normal limits  CBC WITH DIFFERENTIAL/PLATELET - Abnormal; Notable for the following components:   WBC 11.2 (*)    All other components within normal limits  BRAIN NATRIURETIC PEPTIDE  TROPONIN I  TROPONIN I    Lab work reviewed by me with  no acute disease __________________________________________  EKG   ____________________________________________  RADIOLOGY  X-ray reviewed by me with chronic changes ____________________________________________   PROCEDURES  Procedure(s) performed: no  Procedures  Critical Care performed: no  ____________________________________________   INITIAL IMPRESSION / ASSESSMENT AND PLAN / ED COURSE  Pertinent labs & imaging results that were available during my care of the patient were reviewed by me and considered in my medical decision making (see chart for details).   As part of my medical decision making, I reviewed the following data within the [place] History obtained from family if available, nursing notes, old chart and ekg, as well as notes from prior ED visits.  The patient comes to the emergency department with largely asymptomatic bradycardia.  He is concerned this could represent a heart attack.  He was kept on monitor for more than 4 hours with no ectopy.  He is mildly short of breath which is chronic for him and given breathing treatments with some improvement in his symptoms.  Had a lengthy discussion with the patient regarding his bradycardia and instructed him not to take his metoprolol if his heart rate is ever in the 40s.  I have encouraged him not to take his metoprolol today and to follow-up with his cardiologist as scheduled.  Strict return precautions been given.      ____________________________________________   FINAL CLINICAL IMPRESSION(S) / ED DIAGNOSES  Final diagnoses:  Atypical chest pain  Bradycardia      NEW MEDICATIONS STARTED DURING THIS VISIT:  Discharge Medication List as of 01/05/2019  7:57 AM       Note:  This document was prepared using Dragon voice recognition software and may include unintentional dictation errors.    Darel Hong, MD 01/07/19 [phone]

## 2019-01-05 NOTE — ED Notes (Signed)
Pt taken to treatment room 1 via personal electronic wheelchair

## 2019-01-05 NOTE — ED Triage Notes (Signed)
Pt reports being awake this early am and "keeps an eye" on his heart rate; noticed it was 48-49 at home and this became concerning; c/o chest pain intermittently for several days, but continuous since Saturday afternoon; pt wears 2L oxygen via  24/7

## 2019-01-05 NOTE — Discharge Instructions (Signed)
Fortunately today your EKG, your blood work, and your chest x-ray were reassuring.  Please do not ever take your metoprolol if your heart rate is lower than 50 and skip your metoprolol today.  Please follow-up with your primary care physician within 1 week and return to the emergency department for any concerns.  It was a pleasure to take care of you today, and thank you for coming to our emergency department.  If you have any questions or concerns before leaving please ask the nurse to grab me and I'm more than happy to go through your aftercare instructions again.  If you have any concerns once you are home that you are not improving or are in fact getting worse before you can make it to your follow-up appointment, please do not hesitate to call 911 and come back for further evaluation.  Darel Hong, MD  Results for orders placed or performed during the hospital encounter of 01/05/19  Blood gas, venous  Result Value Ref Range   pH, Ven 7.38 7.250 - 7.430   pCO2, Ven 50 44.0 - 60.0 mmHg   pO2, Ven 47.0 (H) 32.0 - 45.0 mmHg   Bicarbonate 29.6 (H) 20.0 - 28.0 mmol/L   Acid-Base Excess 3.4 (H) 0.0 - 2.0 mmol/L   O2 Saturation 81.7 %   Patient temperature 37.0    Collection site LINE    Sample type VENOUS   Comprehensive metabolic panel  Result Value Ref Range   Sodium 136 135 - 145 mmol/L   Potassium 3.8 3.5 - 5.1 mmol/L   Chloride 99 98 - 111 mmol/L   CO2 30 22 - 32 mmol/L   Glucose, Bld 136 (H) 70 - 99 mg/dL   BUN 13 8 - 23 mg/dL   Creatinine, Ser 1.05 0.61 - 1.24 mg/dL   Calcium 8.8 (L) 8.9 - 10.3 mg/dL   Total Protein 7.3 6.5 - 8.1 g/dL   Albumin 3.9 3.5 - 5.0 g/dL   AST 16 15 - 41 U/L   ALT 16 0 - 44 U/L   Alkaline Phosphatase 114 38 - 126 U/L   Total Bilirubin 0.7 0.3 - 1.2 mg/dL   GFR calc non Af Amer >60 >60 mL/min   GFR calc Af Amer >60 >60 mL/min   Anion gap 7 5 - 15  Brain natriuretic peptide  Result Value Ref Range   B Natriuretic Peptide 83.0 0.0 - 100.0 pg/mL   Troponin I - Once  Result Value Ref Range   Troponin I <0.03 <0.03 ng/mL  CBC with Differential  Result Value Ref Range   WBC 11.2 (H) 4.0 - 10.5 K/uL   RBC 4.62 4.22 - 5.81 MIL/uL   Hemoglobin 14.1 13.0 - 17.0 g/dL   HCT 43.6 39.0 - 52.0 %   MCV 94.4 80.0 - 100.0 fL   MCH 30.5 26.0 - 34.0 pg   MCHC 32.3 30.0 - 36.0 g/dL   RDW 15.3 11.5 - 15.5 %   Platelets 231 150 - 400 K/uL   nRBC 0.0 0.0 - 0.2 %   Neutrophils Relative % 68 %   Neutro Abs 7.7 1.7 - 7.7 K/uL   Lymphocytes Relative 22 %   Lymphs Abs 2.5 0.7 - 4.0 K/uL   Monocytes Relative 7 %   Monocytes Absolute 0.8 0.1 - 1.0 K/uL   Eosinophils Relative 1 %   Eosinophils Absolute 0.2 0.0 - 0.5 K/uL   Basophils Relative 1 %   Basophils Absolute 0.1 0.0 - 0.1 K/uL  Immature Granulocytes 1 %   Abs Immature Granulocytes 0.06 0.00 - 0.07 K/uL  Troponin I - Once-Timed  Result Value Ref Range   Troponin I <0.03 <0.03 ng/mL   Dg Chest Port 1 View  Result Date: 01/05/2019 CLINICAL DATA:  Initial evaluation for acute shortness of breath, bradycardia. EXAM: PORTABLE CHEST 1 VIEW COMPARISON:  Prior radiograph from 11/17/2018. FINDINGS: Transverse heart size at the upper limits of normal. Mediastinal silhouettes within normal limits. Lungs normally inflated. Chronic emphysematous changes with associated basilar prominence of the interstitial markings, stable. No focal infiltrates. No edema or effusion. No pneumothorax. No acute osseous finding. IMPRESSION: Chronic emphysematous changes, stable. No other active cardiopulmonary disease. Electronically Signed   By: Jeannine Boga M.D.   On: 01/05/2019 04:18

## 2019-01-08 DIAGNOSIS — J449 Chronic obstructive pulmonary disease, unspecified: Secondary | ICD-10-CM

## 2019-01-08 NOTE — Progress Notes (Signed)
Daily Session Note  Patient Details  Name: Darius Norris MRN: [id number] Date of Birth: [birth year] Referring Provider:     Pulmonary Rehab from 12/23/2018 in [facility] Cardiac and Pulmonary Rehab  Referring Provider  Vernelle Emerald MD      Encounter Date: 01/08/2019  Check In: Session Check In - 01/08/19 1152      Check-In   Supervising physician immediately available to respond to emergencies  [name] immediately available ER MD    Physician(s)  Dr. Mariea Clonts and [name] Balboa    Location  [facility]-Cardiac & Pulmonary Rehab    Staff Present  Alberteen Sam, MA, RCEP, CCRP, Exercise Physiologist;Katlynne Mckercher Alcus Dad, RN BSN    Medication changes reported      No    Fall or balance concerns reported     No    Warm-up and Cool-down  Performed as group-led Higher education careers adviser Performed  Yes    VAD Patient?  No    PAD/SET Patient?  No      Pain Assessment   Currently in Pain?  No/denies          Social History   Tobacco Use  Smoking Status Former Smoker  . Packs/day: 1.00  . Years: 30.00  . Pack years: 30.00  . Types: Cigarettes  . Last attempt to quit: 12/18/2001  . Years since quitting: 17.0  Smokeless Tobacco Never Used    Goals Met:  Independence with exercise equipment Exercise tolerated well No report of cardiac concerns or symptoms Strength training completed today  Goals Unmet:  Not Applicable  Comments: Pt able to follow exercise prescription today without complaint.  Will continue to monitor for progression.    Dr. Emily Filbert is Medical Director for [place] and [name].

## 2019-01-10 DIAGNOSIS — J449 Chronic obstructive pulmonary disease, unspecified: Secondary | ICD-10-CM

## 2019-01-10 NOTE — Progress Notes (Signed)
Daily Session Note  Patient Details  Name: Darius Norris MRN: [id number] Date of Birth: 02/14/1951 Referring Provider:     Pulmonary Rehab from 12/23/2018 in [facility] Cardiac and Pulmonary Rehab  Referring Provider  Oddone, Eugene MD      Encounter Date: 01/10/2019  Check In: Session Check In - 01/10/19 1131      Check-In   Supervising physician immediately available to respond to emergencies  [name] immediately available ER MD    Physician(s)  Dr. Norman and Kinner    Location  [facility]-Cardiac & Pulmonary Rehab    Staff Present    RCP,RRT,BSRT;Jessica Hawkins, MA, RCEP, CCRP, Exercise Physiologist;Meredith Craven, RN BSN    Medication changes reported      No    Fall or balance concerns reported     No    Warm-up and Cool-down  Performed as group-led instruction    Resistance Training Performed  Yes    VAD Patient?  No    PAD/SET Patient?  No      Pain Assessment   Currently in Pain?  No/denies          Social History   Tobacco Use  Smoking Status Former Smoker  . Packs/day: 1.00  . Years: 30.00  . Pack years: 30.00  . Types: Cigarettes  . Last attempt to quit: 12/18/2001  . Years since quitting: 17.0  Smokeless Tobacco Never Used    Goals Met:  Independence with exercise equipment Exercise tolerated well No report of cardiac concerns or symptoms Strength training completed today  Goals Unmet:  Not Applicable  Comments: Pt able to follow exercise prescription today without complaint.  Will continue to monitor for progression.    Dr. Mark Miller is Medical Director for [facility] and [name]. 

## 2019-01-13 DIAGNOSIS — J449 Chronic obstructive pulmonary disease, unspecified: Secondary | ICD-10-CM | POA: Diagnosis not present

## 2019-01-13 NOTE — Progress Notes (Signed)
Daily Session Note  Patient Details  Name: Darius Norris MRN: [id number] Date of Birth: 03-12-1951 Referring Provider:     Pulmonary Rehab from 12/23/2018 in [facility] Cardiac and Pulmonary Rehab  Referring Provider  Vernelle Emerald MD      Encounter Date: 01/13/2019  Check In: Session Check In - 01/13/19 1348      Check-In   Supervising physician immediately available to respond to emergencies  [name] immediately available ER MD    Physician(s)  Dr. Jodell Cipro and Quentin Cornwall    Location  [facility]-Cardiac & Pulmonary Rehab    Staff Present  Justin Mend RCP,RRT,BSRT;Amanda Oletta Darter, BA, ACSM CEP, Exercise Physiologist;Kelly Amedeo Plenty, BS, ACSM CEP, Exercise Physiologist    Medication changes reported      No    Fall or balance concerns reported     No    Warm-up and Cool-down  Performed as group-led instruction    Resistance Training Performed  Yes    VAD Patient?  No      Pain Assessment   Currently in Pain?  No/denies          Social History   Tobacco Use  Smoking Status Former Smoker  . Packs/day: 1.00  . Years: 30.00  . Pack years: 30.00  . Types: Cigarettes  . Last attempt to quit: 12/18/2001  . Years since quitting: 17.0  Smokeless Tobacco Never Used    Goals Met:  Independence with exercise equipment Exercise tolerated well No report of cardiac concerns or symptoms Strength training completed today  Goals Unmet:  Not Applicable  Comments: Pt able to follow exercise prescription today without complaint.  Will continue to monitor for progression.    Dr. Emily Filbert is Medical Director for [place] and [name].

## 2019-01-15 ENCOUNTER — Encounter: Payer: No Typology Code available for payment source | Admitting: *Deleted

## 2019-01-15 DIAGNOSIS — J449 Chronic obstructive pulmonary disease, unspecified: Secondary | ICD-10-CM

## 2019-01-15 NOTE — Progress Notes (Signed)
Daily Session Note  Patient Details  Name: Darius Norris MRN: [id number] Date of Birth: February 27, 1951 Referring Provider:     Pulmonary Rehab from 12/23/2018 in [facility] Cardiac and Pulmonary Rehab  Referring Provider  Vernelle Emerald MD      Encounter Date: 01/15/2019  Check In: Session Check In - 01/15/19 1142      Check-In   Supervising physician immediately available to respond to emergencies  [name] immediately available ER MD    Physician(s)  Drs. Domenic Polite    Location  [facility]-Cardiac & Pulmonary Rehab    Staff Present  Renita Papa, RN BSN;Alysiana Ethridge Luan [place], [place], RCEP, CCRP, Exercise Physiologist;Joseph Tessie Fass RCP,RRT,BSRT    Medication changes reported      No    Fall or balance concerns reported     No    Warm-up and Cool-down  Performed as group-led instruction    Resistance Training Performed  Yes    VAD Patient?  No    PAD/SET Patient?  No      Pain Assessment   Currently in Pain?  No/denies          Social History   Tobacco Use  Smoking Status Former Smoker  . Packs/day: 1.00  . Years: 30.00  . Pack years: 30.00  . Types: Cigarettes  . Last attempt to quit: 12/18/2001  . Years since quitting: 17.0  Smokeless Tobacco Never Used    Goals Met:  Proper associated with RPD/PD & O2 Sat Independence with exercise equipment Using PLB without cueing & demonstrates good technique Exercise tolerated well No report of cardiac concerns or symptoms Strength training completed today  Goals Unmet:  Not Applicable  Comments: Pt able to follow exercise prescription today without complaint.  Will continue to monitor for progression.    Dr. Emily Filbert is Medical Director for [place] and [name].

## 2019-01-17 DIAGNOSIS — J449 Chronic obstructive pulmonary disease, unspecified: Secondary | ICD-10-CM | POA: Diagnosis not present

## 2019-01-17 NOTE — Progress Notes (Signed)
Daily Session Note  Patient Details  Name: Darius Norris MRN: [id number] Date of Birth: Nov 30, 1951 Referring Provider:     Pulmonary Rehab from 12/23/2018 in [facility] Cardiac and Pulmonary Rehab  Referring Provider  Vernelle Emerald MD      Encounter Date: 01/17/2019  Check In:      Social History   Tobacco Use  Smoking Status Former Smoker  . Packs/day: 1.00  . Years: 30.00  . Pack years: 30.00  . Types: Cigarettes  . Last attempt to quit: 12/18/2001  . Years since quitting: 17.0  Smokeless Tobacco Never Used    Goals Met:  Proper associated with RPD/PD & O2 Sat Independence with exercise equipment Exercise tolerated well Strength training completed today  Goals Unmet:  Not Applicable  Comments: Pt able to follow exercise prescription today without complaint.  Will continue to monitor for progression.    Dr. Emily Filbert is Medical Director for [place] and [name].

## 2019-01-20 ENCOUNTER — Encounter: Payer: No Typology Code available for payment source | Attending: Internal Medicine

## 2019-01-20 DIAGNOSIS — J449 Chronic obstructive pulmonary disease, unspecified: Secondary | ICD-10-CM | POA: Diagnosis present

## 2019-01-20 NOTE — Progress Notes (Signed)
Daily Session Note  Patient Details  Name: Darius Norris MRN: [id number] Date of Birth: 1950-12-24 Referring Provider:     Pulmonary Rehab from 12/23/2018 in [facility] Cardiac and Pulmonary Rehab  Referring Provider  Vernelle Emerald MD      Encounter Date: 01/20/2019  Check In:      Social History   Tobacco Use  Smoking Status Former Smoker  . Packs/day: 1.00  . Years: 30.00  . Pack years: 30.00  . Types: Cigarettes  . Last attempt to quit: 12/18/2001  . Years since quitting: 17.1  Smokeless Tobacco Never Used    Goals Met:  Proper associated with RPD/PD & O2 Sat Independence with exercise equipment Exercise tolerated well Strength training completed today  Goals Unmet:  Not Applicable  Comments: Pt able to follow exercise prescription today without complaint.  Will continue to monitor for progression.    Dr. Emily Filbert is Medical Director for [place] and [name].

## 2019-01-24 ENCOUNTER — Encounter: Payer: No Typology Code available for payment source | Admitting: *Deleted

## 2019-01-24 DIAGNOSIS — J449 Chronic obstructive pulmonary disease, unspecified: Secondary | ICD-10-CM

## 2019-01-24 NOTE — Progress Notes (Signed)
Daily Session Note  Patient Details  Name: Darius Norris MRN: [id number] Date of Birth: 09-12-1951 Referring Provider:     Pulmonary Rehab from 12/23/2018 in [facility] Cardiac and Pulmonary Rehab  Referring Provider  Vernelle Emerald MD      Encounter Date: 01/24/2019  Check In: Session Check In - 01/24/19 1139      Check-In   Supervising physician immediately available to respond to emergencies  [name] immediately available ER MD    Physician(s)  Drs. Filiberto Pinks    Location  [facility]-Cardiac & Pulmonary Rehab    Staff Present  Renita Papa, RN BSN;Nylani Michetti Luan [place], [place], RCEP, CCRP, Exercise Physiologist;Joseph Tessie Fass RCP,RRT,BSRT    Medication changes reported      No    Fall or balance concerns reported     No    Warm-up and Cool-down  Performed as group-led instruction    Resistance Training Performed  Yes    VAD Patient?  No    PAD/SET Patient?  No      Pain Assessment   Currently in Pain?  No/denies          Social History   Tobacco Use  Smoking Status Former Smoker  . Packs/day: 1.00  . Years: 30.00  . Pack years: 30.00  . Types: Cigarettes  . Last attempt to quit: 12/18/2001  . Years since quitting: 17.1  Smokeless Tobacco Never Used    Goals Met:  Proper associated with RPD/PD & O2 Sat Independence with exercise equipment Using PLB without cueing & demonstrates good technique Exercise tolerated well No report of cardiac concerns or symptoms Strength training completed today  Goals Unmet:  Not Applicable  Comments: Pt able to follow exercise prescription today without complaint.  Will continue to monitor for progression.    Dr. Emily Filbert is Medical Director for [place] and [name].

## 2019-01-27 DIAGNOSIS — J449 Chronic obstructive pulmonary disease, unspecified: Secondary | ICD-10-CM

## 2019-01-27 NOTE — Progress Notes (Signed)
Daily Session Note  Patient Details  Name: Darius Norris MRN: [id number] Date of Birth: 17-Aug-1951 Referring Provider:     Pulmonary Rehab from 12/23/2018 in [facility] Cardiac and Pulmonary Rehab  Referring Provider  Vernelle Emerald MD      Encounter Date: 01/27/2019  Check In:      Social History   Tobacco Use  Smoking Status Former Smoker  . Packs/day: 1.00  . Years: 30.00  . Pack years: 30.00  . Types: Cigarettes  . Last attempt to quit: 12/18/2001  . Years since quitting: 17.1  Smokeless Tobacco Never Used    Goals Met:  Proper associated with RPD/PD & O2 Sat Independence with exercise equipment Exercise tolerated well Strength training completed today  Goals Unmet:  Not Applicable  Comments: Pt able to follow exercise prescription today without complaint.  Will continue to monitor for progression.    Dr. Emily Filbert is Medical Director for [place] and [name].

## 2019-01-27 NOTE — Progress Notes (Signed)
Pulmonary Individual Treatment Plan  Patient Details  Name: Darius Norris MRN: [id number]  Date of Birth: [birth year] Referring Provider:     Pulmonary Rehab from 12/23/2018 in [facility] Card[facility] Pulmonary Re[facility]  Referring Provider  Vernelle Emerald MD      Initial Encounter Date:    Pulmonary Rehab from 12/23/2018 in [facility] Cardiac and Pulmonary Rehab  Date  12/23/18      Visit Diagnosis: Chronic obstructive pulmonary disease, unspecified COPD type ([place])  Patient's Home Medications on Admission:  Current Outpatient Medications:  .  acetaminophen (TYLENOL) 325 MG tablet, Take 650 mg by mouth every 6 (six) hours as needed. , Disp: , Rfl:  .  amitriptyline (ELAVIL) 10 MG tablet, Take 15 mg by mouth at bedtime. , Disp: , Rfl:  .  aspirin EC 81 MG EC tablet, Take 1 tablet (81 mg total) by mouth daily., Disp: 180 tablet, Rfl: 0 .  atorvastatin (LIPITOR) 80 MG tablet, Take 1 tablet (80 mg total) by mouth at bedtime., Disp: 30 tablet, Rfl: 0 .  budesonide-formoterol (SYMBICORT) 160-4.5 MCG/ACT inhaler, Inhale 2 puffs into the lungs 2 (two) times daily., Disp: , Rfl:  .  busPIRone (BUSPAR) 10 MG tablet, Take 10 mg by mouth 2 (two) times daily., Disp: , Rfl:  .  clobetasol cream (TEMOVATE) 8.78 %, Apply 1 application topically daily as needed. For itching on psoriasis areas, Disp: , Rfl:  .  dabigatran (PRADAXA) 150 MG CAPS capsule, Take 150 mg by mouth 2 (two) times daily., Disp: , Rfl:  .  diltiazem (CARDIZEM CD) 180 MG 24 hr capsule, Take 1 capsule (180 mg total) by mouth daily., Disp: 90 capsule, Rfl: 0 .  fluticasone (FLONASE) 50 MCG/ACT nasal spray, Place 2 sprays into both nostrils daily., Disp: , Rfl:  .  gabapentin (NEURONTIN) 100 MG capsule, Take 100 mg by mouth at bedtime., Disp: , Rfl:  .  Ipratropium-Albuterol (COMBIVENT RESPIMAT) 20-100 MCG/ACT AERS respimat, Inhale 1 puff into the lungs every 6 (six) hours as needed for wheezing or shortness of breath., Disp: , Rfl:  .  isosorbide  mononitrate (IMDUR) 60 MG 24 hr tablet, Take 60 mg by mouth daily., Disp: , Rfl:  .  lisinopril (PRINIVIL,ZESTRIL) 40 MG tablet, Take 20 mg by mouth daily. , Disp: , Rfl:  .  loratadine (CLARITIN) 10 MG tablet, Take 10 mg by mouth daily as needed for allergies. , Disp: , Rfl:  .  metoprolol tartrate (LOPRESSOR) 100 MG tablet, Take 1 tablet (100 mg total) by mouth 2 (two) times daily., Disp: 60 tablet, Rfl: 0 .  omeprazole (PRILOSEC) 20 MG capsule, Take 20 mg by mouth daily. , Disp: , Rfl:  .  potassium chloride (K-DUR,KLOR-CON) 10 MEQ tablet, Take 10 mEq by mouth daily., Disp: , Rfl:  .  senna (SENOKOT) 8.6 MG TABS tablet, Take 1 tablet by mouth daily as needed for mild constipation., Disp: , Rfl:  .  traZODone (DESYREL) 50 MG tablet, Take 1 tablet (50 mg total) by mouth at bedtime as needed for sleep., Disp: 30 tablet, Rfl: 0  Past Medical History: Past Medical History:  Diagnosis Date  . A-fib ([place])   . CAD (coronary artery disease)   . Colitis   . COPD (chronic obstructive pulmonary disease) ([place])   . Emphysema lung ([place])   . GERD (gastroesophageal reflux disease)   . Hypertension   . MI (myocardial infarction) ([place]) 1999    Tobacco Use: Social History   Tobacco Use  Smoking Status  Former Smoker  . Packs/day: 1.00  . Years: 30.00  . Pack years: 30.00  . Types: Cigarettes  . Last attempt to quit: 12/18/2001  . Years since quitting: 17.1  Smokeless Tobacco Never Used    Labs: Recent Chemical engineer    Labs for ITP Cardiac and Pulmonary Rehab Latest Ref Rng & Units 02/04/2013 09/04/2016 05/02/2018 01/05/2019   Cholestrol 0 - 200 mg/dL 185 116 - -   LDLCALC 0 - 99 mg/dL 114(H) 51 - -   HDL >40 mg/dL 38(L) 42 - -   Trlycerides <150 mg/dL 166 116 - -   Hemoglobin A1c 4.8 - 5.6 % - 6.4(H) - -   HCO3 20.0 - 28.0 mmol/L - - 36.3(H) 29.6(H)   O2SAT % - - - 81.7       Pulmonary Assessment Scores: Pulmonary Assessment Scores    Row Name 12/23/18 1425         ADL UCSD    ADL Phase  Entry     SOB Score total  90     Rest  3     Walk  4     Stairs  5     Bath  4     Dress  2     Shop  3       CAT Score   CAT Score  24       mMRC Score   mMRC Score  2        Pulmonary Function Assessment: Pulmonary Function Assessment - 12/23/18 1423      Post Bronchodilator Spirometry Results   Comments  results taken from 06/27/16      Breath   Bilateral Breath Sounds  Clear    Shortness of Breath  Yes;Fear of Shortness of Breath;Limiting activity       Exercise Target Goals: Exercise Program Goal: Individual exercise prescription set using results from initial 6 min walk test and THRR while considering  patient's activity barriers and safety.   Exercise Prescription Goal: Initial exercise prescription builds to 30-45 minutes a day of aerobic activity, 2-3 days per week.  Home exercise guidelines will be given to patient during program as part of exercise prescription that the participant will acknowledge.  Activity Barriers & Risk Stratification: Activity Barriers & Cardiac Risk Stratification - 12/23/18 1517      Activity Barriers & Cardiac Risk Stratification   Activity Barriers  Shortness of Breath;Deconditioning;Back Problems;Muscular Weakness;Balance Concerns;Assistive Device   chronic back pain      6 Minute Walk: 6 Minute Walk    Row Name 12/23/18 1510         6 Minute Walk   Phase  Initial     Distance  523 feet     Walk Time  3.92 minutes     # of Rest Breaks  2 63 sec; 62 sec     MPH  1.52     METS  1.57     RPE  11     Perceived Dyspnea   3     VO2 Peak  5.49     Symptoms  Yes (comment)     Comments  SOB; back pain 6/10     Resting HR  67 bpm     Resting BP  126/64     Resting Oxygen Saturation   94 %     Exercise Oxygen Saturation  during 6 min walk  82 %     Max Ex. HR  99 bpm     Max Ex. BP  164/62     2 Minute Post BP  140/66       Interval HR   1 Minute HR  94     2 Minute HR  92     3 Minute HR  91     4  Minute HR  99     5 Minute HR  93     6 Minute HR  98     2 Minute Post HR  81     Interval Heart Rate?  Yes       Interval Oxygen   Interval Oxygen?  Yes     Baseline Oxygen Saturation %  94 %     1 Minute Oxygen Saturation %  86 % seated rest 1:17-2:20     1 Minute Liters of Oxygen  2 L     2 Minute Oxygen Saturation %  82 %     2 Minute Liters of Oxygen  2 L     3 Minute Oxygen Saturation %  88 % 3:43-4:45     3 Minute Liters of Oxygen  2 L     4 Minute Oxygen Saturation %  82 %     4 Minute Liters of Oxygen  2 L     5 Minute Oxygen Saturation %  90 %     5 Minute Liters of Oxygen  2 L     6 Minute Oxygen Saturation %  83 %     6 Minute Liters of Oxygen  2 L     2 Minute Post Oxygen Saturation %  93 %     2 Minute Post Liters of Oxygen  2 L       Oxygen Initial Assessment: Oxygen Initial Assessment - 12/23/18 1413      Home Oxygen   Home Oxygen Device  Home Concentrator;E-Tanks    Sleep Oxygen Prescription  Continuous    Liters per minute  2    Home Exercise Oxygen Prescription  Continuous    Liters per minute  2    Home at Rest Exercise Oxygen Prescription  Continuous    Liters per minute  2    Compliance with Home Oxygen Use  Yes      Initial 6 min Walk   Oxygen Used  Continuous    Liters per minute  2      Program Oxygen Prescription   Program Oxygen Prescription  Continuous    Liters per minute  2      Intervention   Short Term Goals  To learn and exhibit compliance with exercise, home and travel O2 prescription;To learn and understand importance of monitoring SPO2 with pulse oximeter and demonstrate accurate use of the pulse oximeter.;To learn and understand importance of maintaining oxygen saturations>88%;To learn and demonstrate proper pursed lip breathing techniques or other breathing techniques.;To learn and demonstrate proper use of respiratory medications    Long  Term Goals  Exhibits compliance with exercise, home and travel O2 prescription;Verbalizes  importance of monitoring SPO2 with pulse oximeter and return demonstration;Maintenance of O2 saturations>88%;Exhibits proper breathing techniques, such as pursed lip breathing or other method taught during program session;Compliance with respiratory medication;Demonstrates proper use of MDI's       Oxygen Re-Evaluation: Oxygen Re-Evaluation    Row Name 12/30/18 1422             Program Oxygen Prescription   Program Oxygen Prescription  Continuous  Liters per minute  2         Home Oxygen   Home Oxygen Device  Home Concentrator;E-Tanks       Sleep Oxygen Prescription  Continuous       Liters per minute  2       Home Exercise Oxygen Prescription  Continuous       Liters per minute  2       Home at Rest Exercise Oxygen Prescription  Continuous       Liters per minute  2       Compliance with Home Oxygen Use  Yes         Goals/Expected Outcomes   Short Term Goals  To learn and exhibit compliance with exercise, home and travel O2 prescription;To learn and understand importance of monitoring SPO2 with pulse oximeter and demonstrate accurate use of the pulse oximeter.;To learn and understand importance of maintaining oxygen saturations>88%;To learn and demonstrate proper pursed lip breathing techniques or other breathing techniques.;To learn and demonstrate proper use of respiratory medications       Long  Term Goals  Exhibits compliance with exercise, home and travel O2 prescription;Verbalizes importance of monitoring SPO2 with pulse oximeter and return demonstration;Maintenance of O2 saturations>88%;Exhibits proper breathing techniques, such as pursed lip breathing or other method taught during program session;Compliance with respiratory medication;Demonstrates proper use of MDI's       Comments  Reviewed PLB technique with pt.  Talked about how it work and it's important to maintaining his exercise saturations.         Goals/Expected Outcomes  Short: Become more profiecient at using  PLB.   Long: Become independent at using PLB.          Oxygen Discharge (Final Oxygen Re-Evaluation): Oxygen Re-Evaluation - 12/30/18 1422      Program Oxygen Prescription   Program Oxygen Prescription  Continuous    Liters per minute  2      Home Oxygen   Home Oxygen Device  Home Concentrator;E-Tanks    Sleep Oxygen Prescription  Continuous    Liters per minute  2    Home Exercise Oxygen Prescription  Continuous    Liters per minute  2    Home at Rest Exercise Oxygen Prescription  Continuous    Liters per minute  2    Compliance with Home Oxygen Use  Yes      Goals/Expected Outcomes   Short Term Goals  To learn and exhibit compliance with exercise, home and travel O2 prescription;To learn and understand importance of monitoring SPO2 with pulse oximeter and demonstrate accurate use of the pulse oximeter.;To learn and understand importance of maintaining oxygen saturations>88%;To learn and demonstrate proper pursed lip breathing techniques or other breathing techniques.;To learn and demonstrate proper use of respiratory medications    Long  Term Goals  Exhibits compliance with exercise, home and travel O2 prescription;Verbalizes importance of monitoring SPO2 with pulse oximeter and return demonstration;Maintenance of O2 saturations>88%;Exhibits proper breathing techniques, such as pursed lip breathing or other method taught during program session;Compliance with respiratory medication;Demonstrates proper use of MDI's    Comments  Reviewed PLB technique with pt.  Talked about how it work and it's important to maintaining his exercise saturations.      Goals/Expected Outcomes  Short: Become more profiecient at using PLB.   Long: Become independent at using PLB.       Initial Exercise Prescription: Initial Exercise Prescription - 12/23/18 1500      Date of  Initial Exercise RX and Referring Provider   Date  12/23/18    Referring Provider  Vernelle Emerald MD      Oxygen   Oxygen   Continuous    Liters  3      Treadmill   MPH  1    Grade  0.5    Minutes  15    METs  1.83      NuStep   Level  1    SPM  80    Minutes  15    METs  1.8      REL-XR   Level  1    Speed  50    Minutes  15    METs  1.8      Prescription Details   Frequency (times per week)  3    Duration  Progress to 45 minutes of aerobic exercise without signs/symptoms of physical distress      Intensity   THRR 40-80% of Max Heartrate  101-136    Ratings of Perceived Exertion  11-13    Perceived Dyspnea  0-4      Progression   Progression  Continue to progress workloads to maintain intensity without signs/symptoms of physical distress.      Resistance Training   Training Prescription  Yes    Weight  3 lbs    Reps  10-15       Perform Capillary Blood Glucose checks as needed.  Exercise Prescription Changes: Exercise Prescription Changes    Row Name 12/23/18 1500 12/30/18 1600 01/01/19 1200 01/14/19 1300       Response to Exercise   Blood Pressure (Admit)  126/64  130/80  -  134/68    Blood Pressure (Exercise)  164/62  138/64  -  -    Blood Pressure (Exit)  140/66  132/82  -  108/60    Heart Rate (Admit)  67 bpm  76 bpm  -  63 bpm    Heart Rate (Exercise)  99 bpm  73 bpm  -  78 bpm    Heart Rate (Exit)  81 bpm  71 bpm  -  63 bpm    Oxygen Saturation (Admit)  94 %  91 %  -  88 %    Oxygen Saturation (Exercise)  82 %  96 %  -  95 %    Oxygen Saturation (Exit)  93 %  94 %  -  97 %    Rating of Perceived Exertion (Exercise)  17  13  -  13    Perceived Dyspnea (Exercise)  3  2  -  2    Symptoms  back pain 6/10; SOB  none  -  SOB    Comments  walk test results  first full day of exercise  -  -    Duration  -  Progress to 45 minutes of aerobic exercise without signs/symptoms of physical distress  -  Continue with 45 min of aerobic exercise without signs/symptoms of physical distress.    Intensity  -  THRR unchanged  -  THRR unchanged      Progression   Progression  -  Continue  to progress workloads to maintain intensity without signs/symptoms of physical distress.  -  Continue to progress workloads to maintain intensity without signs/symptoms of physical distress.    Average METs  -  1.81  -  2.28      Resistance Training   Training Prescription  -  Yes  -  Yes    Weight  -  3 lbs  -  3 lbs    Reps  -  10-15  -  10-15      Interval Training   Interval Training  -  No  -  No      Oxygen   Oxygen  -  Continuous  -  Continuous    Liters  -  3  -  3      Treadmill   MPH  -  1  -  1    Grade  -  0.5  -  0.5    Minutes  -  15  -  15    METs  -  1.83  -  1.83      NuStep   Level  -  1  -  2    Minutes  -  15  -  15    METs  -  2.3  -  2.5      REL-XR   Level  -  1  -  1    Minutes  -  15  -  15    METs  -  1.3  -  2.5      Home Exercise Plan   Plans to continue exercise at  -  -  Home (comment) walking, stationary bike  Home (comment) walking, stationary bike    Frequency  -  -  Add 1 additional day to program exercise sessions.  Add 1 additional day to program exercise sessions.    Initial Home Exercises Provided  -  -  01/01/19  01/01/19       Exercise Comments: Exercise Comments    Row Name 12/30/18 1144           Exercise Comments   First full day of exercise!  Patient was oriented to gym and equipment including functions, settings, policies, and procedures.  Patient's individual exercise prescription and treatment plan were reviewed.  All starting workloads were established based on the results of the 6 minute walk test done at initial orientation visit.  The plan for exercise progression was also introduced and progression will be customized based on patient's performance and goals.          Exercise Goals and Review: Exercise Goals    Row Name 12/23/18 1554             Exercise Goals   Increase Physical Activity  Yes       Intervention  Provide advice, education, support and counseling about physical activity/exercise  needs.;Develop an individualized exercise prescription for aerobic and resistive training based on initial evaluation findings, risk stratification, comorbidities and participant's personal goals.       Expected Outcomes  Short Term: Attend rehab on a regular basis to increase amount of physical activity.;Long Term: Add in home exercise to make exercise part of routine and to increase amount of physical activity.;Long Term: Exercising regularly at least 3-5 days a week.       Increase Strength and Stamina  Yes       Intervention  Provide advice, education, support and counseling about physical activity/exercise needs.;Develop an individualized exercise prescription for aerobic and resistive training based on initial evaluation findings, risk stratification, comorbidities and participant's personal goals.       Expected Outcomes  Short Term: Increase workloads from initial exercise prescription for resistance, speed, and METs.;Short Term: Perform resistance training exercises routinely during rehab and add in  resistance training at home;Long Term: Improve cardiorespiratory fitness, muscular endurance and strength as measured by increased METs and functional capacity (6MWT)       Able to understand and use rate of perceived exertion (RPE) scale  Yes       Intervention  Provide education and explanation on how to use RPE scale       Expected Outcomes  Short Term: Able to use RPE daily in rehab to express subjective intensity level;Long Term:  Able to use RPE to guide intensity level when exercising independently       Able to understand and use Dyspnea scale  Yes       Intervention  Provide education and explanation on how to use Dyspnea scale       Expected Outcomes  Short Term: Able to use Dyspnea scale daily in rehab to express subjective sense of shortness of breath during exertion;Long Term: Able to use Dyspnea scale to guide intensity level when exercising independently       Knowledge and  understanding of Target Heart Rate Range (THRR)  Yes       Intervention  Provide education and explanation of THRR including how the numbers were predicted and where they are located for reference       Expected Outcomes  Short Term: Able to state/look up THRR;Short Term: Able to use daily as guideline for intensity in rehab;Long Term: Able to use THRR to govern intensity when exercising independently       Able to check pulse independently  Yes       Intervention  Provide education and demonstration on how to check pulse in carotid and radial arteries.;Review the importance of being able to check your own pulse for safety during independent exercise       Expected Outcomes  Short Term: Able to explain why pulse checking is important during independent exercise;Long Term: Able to check pulse independently and accurately       Understanding of Exercise Prescription  Yes       Intervention  Provide education, explanation, and written materials on patient's individual exercise prescription       Expected Outcomes  Short Term: Able to explain program exercise prescription;Long Term: Able to explain home exercise prescription to exercise independently          Exercise Goals Re-Evaluation : Exercise Goals Re-Evaluation    [place] Name 12/30/18 1144 01/01/19 1215 01/14/19 1353         Exercise Goal Re-Evaluation   Exercise Goals Review  Increase Physical Activity;Able to understand and use rate of perceived exertion (RPE) scale;Knowledge and understanding of Target Heart Rate Range (THRR);Understanding of Exercise Prescription;Increase Strength and Stamina;Able to understand and use Dyspnea scale  Increase Physical Activity;Able to understand and use rate of perceived exertion (RPE) scale;Knowledge and understanding of Target Heart Rate Range (THRR);Understanding of Exercise Prescription;Increase Strength and Stamina;Able to understand and use Dyspnea scale;Able to check pulse independently  Increase  Physical Activity;Increase Strength and Stamina;Understanding of Exercise Prescription     Comments  Reviewed RPE scale, THR and program prescription with pt today.  Pt voiced understanding and was given a copy of goals to take home.   Reviewed home exercise with pt today.  Pt plans to walking and ride his stationary bike for exercise.  Reviewed THR, pulse, RPE, sign and symptoms, and when to call 911 or MD.  Also discussed weather considerations and indoor options.  Pt voiced understanding.  Darius Norris has been doing well in  rehab.  He is up to level 2 on the NuStep.  We will start to increase his other workloads as his sats are improving.  We will continue to monitor his progress.      Expected Outcomes  Short: Use RPE daily to regulate intensity. Long: Follow program prescription in THR.  Short: Start to add in exercise at home for at least one extra day a week.  Long: Continue to increase strength and stamina.   Short: Increase workloads.  Long: Continue to increase strength and stamina.         Discharge Exercise Prescription (Final Exercise Prescription Changes): Exercise Prescription Changes - 01/14/19 1300      Response to Exercise   Blood Pressure (Admit)  134/68    Blood Pressure (Exit)  108/60    Heart Rate (Admit)  63 bpm    Heart Rate (Exercise)  78 bpm    Heart Rate (Exit)  63 bpm    Oxygen Saturation (Admit)  88 %    Oxygen Saturation (Exercise)  95 %    Oxygen Saturation (Exit)  97 %    Rating of Perceived Exertion (Exercise)  13    Perceived Dyspnea (Exercise)  2    Symptoms  SOB    Duration  Continue with 45 min of aerobic exercise without signs/symptoms of physical distress.    Intensity  THRR unchanged      Progression   Progression  Continue to progress workloads to maintain intensity without signs/symptoms of physical distress.    Average METs  2.28      Resistance Training   Training Prescription  Yes    Weight  3 lbs    Reps  10-15      Interval Training   Interval  Training  No      Oxygen   Oxygen  Continuous    Liters  3      Treadmill   MPH  1    Grade  0.5    Minutes  15    METs  1.83      NuStep   Level  2    Minutes  15    METs  2.5      REL-XR   Level  1    Minutes  15    METs  2.5      Home Exercise Plan   Plans to continue exercise at  Home (comment)   walking, stationary bike   Frequency  Add 1 additional day to program exercise sessions.    Initial Home Exercises Provided  01/01/19       Nutrition:  Target Goals: Understanding of nutrition guidelines, daily intake of sodium <1554m, cholesterol <2050m calories 30% from fat and 7% or less from saturated fats, daily to have 5 or more servings of fruits and vegetables.  Biometrics: Pre Biometrics - 12/23/18 1554      Pre Biometrics   Height  5' 6"  (1.676 m)    Weight  209 lb 6.4 oz (95 kg)    Waist Circumference  43 inches    Hip Circumference  47 inches    Waist to Hip Ratio  0.91 %    BMI (Calculated)  33.81    Single Leg Stand  1.41 seconds        Nutrition Therapy Plan and Nutrition Goals: Nutrition Therapy & Goals - 12/30/18 1232      Nutrition Therapy   Diet  TLC    Drug/Food Interactions  Statins/Certain  Fruits    Protein (specify units)  8-9oz    Fiber  30 grams    Whole Grain Foods  3 servings   not currently choosing whole grains   Saturated Fats  15 max. grams    Fruits and Vegetables  5 servings/day   8 ideal. eats more vegetables in the summer, trying to eat more fruits   Sodium  2000 grams      Personal Nutrition Goals   Nutrition Goal  Start to decrease soda intake. Replace one can with a glass of water each day as an initial goal    Personal Goal #2  Eat fruits and vegetables more regularly, paricularly vegetables. Great job for starting to eat grapes more often as part of your breakfast    Personal Goal #3  Start to become mindful of the amount of salt you are adding to meals    Comments  Pt reports recent wt gain of approx 8-10# d/t  increased appetite r/t Prednisone. His wife comes home from work at [employer] and so he eats 'lightly' until then. Breakfast: homemade biscuit with sausage or bacon, egg, grapes, cinnamon coffee cake. Snacks: pretzels, frozen fruit bar, [place] Ice, biscuit. Dinner: soup, sandwich, 'breakfast for dinner.' They eat 1-2 "substantial meals" / week for dinner IE meat + starch + vegetable. He likes beans, green beans, potatoes, beef, chicken, seafood 1x/month, BBQ meal out 1x/month. Currently adds salt to meals as he feels that adding salt is the only way he is able to taste food      Intervention Plan   Intervention  Prescribe, educate and counsel regarding individualized specific dietary modifications aiming towards targeted core components such as weight, hypertension, lipid management, diabetes, heart failure and other comorbidities.;Nutrition handout(s) given to patient.   Dietary tips for better breathing   Expected Outcomes  Short Term Goal: Understand basic principles of dietary content, such as calories, fat, sodium, cholesterol and nutrients.;Short Term Goal: A plan has been developed with personal nutrition goals set during dietitian appointment.;Long Term Goal: Adherence to prescribed nutrition plan.       Nutrition Assessments:   Nutrition Goals Re-Evaluation: Nutrition Goals Re-Evaluation    [place] Name 12/30/18 1246             Goals   Nutrition Goal  Start to decrease soda intake. Replace one can with a glass of water each day as an initial goal       Comment  Currently drinking mostly Pepsi as beverage choices. Occasionally will drink water but is trying to be better about drinking more       Expected Outcome  Short term: replace one can of soda per day with water. Long term: eliminate soda from diet and drink non-sugar sweetened beverages instead         Personal Goal #2 Re-Evaluation   Personal Goal #2  Eat fruits and vegetables more regularly, particularly vegetables. Great job for  starting to eat grapes more often as part of your breakfast         Personal Goal #3 Re-Evaluation   Personal Goal #3  Start to become mindful of the amount of salt you are adding to meals          Nutrition Goals Discharge (Final Nutrition Goals Re-Evaluation): Nutrition Goals Re-Evaluation - 12/30/18 1246      Goals   Nutrition Goal  Start to decrease soda intake. Replace one can with a glass of water each day as an initial goal  Comment  Currently drinking mostly Pepsi as beverage choices. Occasionally will drink water but is trying to be better about drinking more    Expected Outcome  Short term: replace one can of soda per day with water. Long term: eliminate soda from diet and drink non-sugar sweetened beverages instead      Personal Goal #2 Re-Evaluation   Personal Goal #2  Eat fruits and vegetables more regularly, particularly vegetables. Great job for starting to eat grapes more often as part of your breakfast      Personal Goal #3 Re-Evaluation   Personal Goal #3  Start to become mindful of the amount of salt you are adding to meals       Psychosocial: Target Goals: Acknowledge presence or absence of significant depression and/or stress, maximize coping skills, provide positive support system. Participant is able to verbalize types and ability to use techniques and skills needed for reducing stress and depression.   Initial Review & Psychosocial Screening: Initial Psych Review & Screening - 12/23/18 1415      Initial Review   Current issues with  Current Anxiety/Panic;Current Stress Concerns    Source of Stress Concerns  Chronic Illness    Comments  Sometimes has panic issues with shortness of breath.      Family Dynamics   Good Support System?  Yes    Comments  He can look to his wife and his two sisters for support.      Barriers   Psychosocial barriers to participate in program  The patient should benefit from training in stress management and relaxation.       Screening Interventions   Interventions  Encouraged to exercise;Program counselor consult;To provide support and resources with identified psychosocial needs;Provide feedback about the scores to participant    Expected Outcomes  Short Term goal: Utilizing psychosocial counselor, staff and physician to assist with identification of specific Stressors or current issues interfering with healing process. Setting desired goal for each stressor or current issue identified.;Long Term Goal: Stressors or current issues are controlled or eliminated.;Short Term goal: Identification and review with participant of any Quality of Life or Depression concerns found by scoring the questionnaire.;Long Term goal: The participant improves quality of Life and PHQ9 Scores as seen by post scores and/or verbalization of changes       Quality of Life Scores:  Scores of 19 and below usually indicate a poorer quality of life in these areas.  A difference of  2-3 points is a clinically meaningful difference.  A difference of 2-3 points in the total score of the Quality of Life Index has been associated with significant improvement in overall quality of life, self-image, physical symptoms, and general health in studies assessing change in quality of life.  PHQ-9: Recent Review Flowsheet Data    Depression screen [facility] 2/9 12/23/2018 11/20/2016 06/27/2016   Decreased Interest 2 1 0   Down, Depressed, Hopeless 0 1 0   PHQ - 2 Score 2 2 0   Altered sleeping 2 1 1    Tired, decreased energy 3 2 2    Change in appetite 1 1 1    Feeling bad or failure about yourself  0 0 0   Trouble concentrating 0 0 0   Moving slowly or fidgety/restless 1 0 0   Suicidal thoughts 0 0 0   PHQ-9 Score 9 6 4    Difficult doing work/chores Very difficult Very difficult Somewhat difficult     Interpretation of Total Score  Total Score Depression  Severity:  1-4 = Minimal depression, 5-9 = Mild depression, 10-14 = Moderate depression, 15-19 = Moderately  severe depression, 20-27 = Severe depression   Psychosocial Evaluation and Intervention: Psychosocial Evaluation - 01/13/19 1236      Psychosocial Evaluation & Interventions   Interventions  Stress management education;Encouraged to exercise with the program and follow exercise prescription    Comments  Darius Norris) has returned to this program after several years being out.  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  He has a strong support system with a spouse and daughter and (3) sisters who live locally.  He reports not sleeping well with 3-4 hours at night and 3 hours in the afternoons.  Darius Norris states he had a sleep study done several years ago but no treatment.  His appetite is good.  He denies a history of depression or anxiety but his recent admissions mention anxiety with medication.  He reports typically being in a positive mood most of the time.  Darius Norris has multiple stressors with his health; that of his spouse; and finances.  He has goals to breathe better; exercise and get stronger and socialize in the process.  Staff will follow.     Expected Outcomes  Short:  Darius Norris will exercise for his health and mental health - stress reduction and hopefully sleep quality.  Long: Darius Norris will learn positive coping strategies including exercise and practice them consistently.     Continue Psychosocial Services   Follow up required by staff       Psychosocial Re-Evaluation:   Psychosocial Discharge (Final Psychosocial Re-Evaluation):   Education: Education Goals: Education classes will be provided on a weekly basis, covering required topics. Participant will state understanding/return demonstration of topics presented.  Learning Barriers/Preferences: Learning Barriers/Preferences - 12/23/18 1417      Learning Barriers/Preferences   Learning Barriers  None    Learning Preferences  Group Instruction;Individual Instruction;Pictoral;Skilled Demonstration;Verbal Instruction;Video;Written Material        Education Topics:  Initial Evaluation Education: - Verbal, written and demonstration of respiratory meds, oximetry and breathing techniques. Instruction on use of nebulizers and MDIs and importance of monitoring MDI activations.   Pulmonary Rehab from 01/24/2019 in [facility] Cardiac and Pulmonary Rehab  Date  12/23/18  Educator  [facility]  Instruction Review Code  1- Verbalizes Understanding      General Nutrition Guidelines/Fats and Fiber: -Group instruction provided by verbal, written material, models and posters to present the general guidelines for heart healthy nutrition. Gives an explanation and review of dietary fats and fiber.   Controlling Sodium/Reading Food Labels: -Group verbal and written material supporting the discussion of sodium use in heart healthy nutrition. Review and explanation with models, verbal and written materials for utilization of the food label.   Exercise Physiology & General Exercise Guidelines: - Group verbal and written instruction with models to review the exercise physiology of the cardiovascular system and associated critical values. Provides general exercise guidelines with specific guidelines to those with heart or lung disease.    Pulmonary Rehab from 01/24/2019 in [facility] Cardiac and Pulmonary Rehab  Date  01/08/19  Educator  [facility]  Instruction Review Code  1- Verbalizes Understanding      Aerobic Exercise & Resistance Training: - Gives group verbal and written instruction on the various components of exercise. Focuses on aerobic and resistive training programs and the benefits of this training and how to safely progress through these programs.   Pulmonary Rehab from 01/24/2019 in [facility] Cardiac and Pulmonary  Rehab  Date  01/10/19  Educator  [facility]  Instruction Review Code  1- Geologist, engineering, Balance, Mind/Body Relaxation: Provides group verbal/written instruction on the benefits of flexibility and balance training, including  mind/body exercise modes such as yoga, pilates and tai chi.  Demonstration and skill practice provided.   Pulmonary Rehab from 01/24/2019 in [facility] Cardiac and Pulmonary Rehab  Date  01/15/19  Educator  [facility]  Instruction Review Code  1- Verbalizes Understanding      Stress and Anxiety: - Provides group verbal and written instruction about the health risks of elevated stress and causes of high stress.  Discuss the correlation between heart/lung disease and anxiety and treatment options. Review healthy ways to manage with stress and anxiety.   Depression: - Provides group verbal and written instruction on the correlation between heart/lung disease and depressed mood, treatment options, and the stigmas associated with seeking treatment.   Exercise & Equipment Safety: - Individual verbal instruction and demonstration of equipment use and safety with use of the equipment.   Pulmonary Rehab from 01/24/2019 in [facility] Cardiac and Pulmonary Rehab  Date  12/23/18  Educator  [facility]  Instruction Review Code  1- Verbalizes Understanding      Infection Prevention: - Provides verbal and written material to individual with discussion of infection control including proper hand washing and proper equipment cleaning during exercise session.   Pulmonary Rehab from 01/24/2019 in [facility] Cardiac and Pulmonary Rehab  Date  12/23/18  Educator  [facility]  Instruction Review Code  1- Verbalizes Understanding      Falls Prevention: - Provides verbal and written material to individual with discussion of falls prevention and safety.   Pulmonary Rehab from 01/24/2019 in [facility] Cardiac and Pulmonary Rehab  Date  12/23/18  Educator  [facility]  Instruction Review Code  1- Verbalizes Understanding      Diabetes: - Individual verbal and written instruction to review signs/symptoms of diabetes, desired ranges of glucose level fasting, after meals and with exercise. Advice that pre and post exercise glucose checks will be done for 3 sessions at  entry of program.   Chronic Lung Diseases: - Group verbal and written instruction to review updates, respiratory medications, advancements in procedures and treatments. Discuss use of supplemental oxygen including available portable oxygen systems, continuous and intermittent flow rates, concentrators, personal use and safety guidelines. Review proper use of inhaler and spacers. Provide informative websites for self-education.    Pulmonary Rehab from 01/24/2019 in [facility] Cardiac and Pulmonary Rehab  Date  01/24/19  Educator  [facility]  Instruction Review Code  1- Verbalizes Understanding      Energy Conservation: - Provide group verbal and written instruction for methods to conserve energy, plan and organize activities. Instruct on pacing techniques, use of adaptive equipment and posture/positioning to relieve shortness of breath.   Pulmonary Rehab from 05/24/2015 in [place]  Date  05/12/15  Educator  [facility]  Instruction Review Code (retired)  2- meets goals/outcomes      Triggers and Exacerbations: - Group verbal and written instruction to review types of environmental triggers and ways to prevent exacerbations. Discuss weather changes, air quality and the benefits of nasal washing. Review warning signs and symptoms to help prevent infections. Discuss techniques for effective airway clearance, coughing, and vibrations.   AED/CPR: - Group verbal and written instruction with the use of models to demonstrate the basic use of the AED with the basic ABC's of resuscitation.  Pulmonary Rehab from 01/24/2019 in [facility] Cardiac and Pulmonary Rehab  Date  01/01/19  Educator  [facility]  Instruction Review Code  1- Actuary and Physiology of the Lungs: - Group verbal and written instruction with the use of models to provide basic lung anatomy and physiology related to function, structure and complications of lung disease.   Anatomy & Physiology of  the Heart: - Group verbal and written instruction and models provide basic cardiac anatomy and physiology, with the coronary electrical and arterial systems. Review of Valvular disease and Heart Failure   Cardiac Medications: - Group verbal and written instruction to review commonly prescribed medications for heart disease. Reviews the medication, class of the drug, and side effects.   Know Your Numbers and Risk Factors: -Group verbal and written instruction about important numbers in your health.  Discussion of what are risk factors and how they play a role in the disease process.  Review of Cholesterol, Blood Pressure, Diabetes, and BMI and the role they play in your overall health.   Sleep Hygiene: -Provides group verbal and written instruction about how sleep can affect your health.  Define sleep hygiene, discuss sleep cycles and impact of sleep habits. Review good sleep hygiene tips.    Other: -Provides group and verbal instruction on various topics (see comments)    Knowledge Questionnaire Score: Knowledge Questionnaire Score - 12/23/18 1423      Knowledge Questionnaire Score   Pre Score  16/18   reviewed with patient       Core Components/Risk Factors/Patient Goals at Admission: Personal Goals and Risk Factors at Admission - 12/23/18 1418      Core Components/Risk Factors/Patient Goals on Admission    Weight Management  Yes;Weight Loss    Intervention  Weight Management: Develop a combined nutrition and exercise program designed to reach desired caloric intake, while maintaining appropriate intake of nutrient and fiber, sodium and fats, and appropriate energy expenditure required for the weight goal.;Weight Management: Provide education and appropriate resources to help participant work on and attain dietary goals.    Admit Weight  209 lb 6.4 oz (95 kg)    Goal Weight: Short Term  204 lb (92.5 kg)    Goal Weight: Long Term  180 lb (81.6 kg)    Expected Outcomes  Short  Term: Continue to assess and modify interventions until short term weight is achieved;Long Term: Adherence to nutrition and physical activity/exercise program aimed toward attainment of established weight goal;Weight Loss: Understanding of general recommendations for a balanced deficit meal plan, which promotes 1-2 lb weight loss per week and includes a negative energy balance of [phone] kcal/d;Understanding recommendations for meals to include 15-35% energy as protein, 25-35% energy from fat, 35-60% energy from carbohydrates, less than 271m of dietary cholesterol, 20-35 gm of total fiber daily;Understanding of distribution of calorie intake throughout the day with the consumption of 4-5 meals/snacks    Improve shortness of breath with ADL's  Yes    Intervention  Provide education, individualized exercise plan and daily activity instruction to help decrease symptoms of SOB with activities of daily living.    Expected Outcomes  Short Term: Improve cardiorespiratory fitness to achieve a reduction of symptoms when performing ADLs;Long Term: Be able to perform more ADLs without symptoms or delay the onset of symptoms    Hypertension  Yes   AFIB   Intervention  Provide education on lifestyle modifcations including regular physical activity/exercise, weight management, moderate sodium restriction and  increased consumption of fresh fruit, vegetables, and low fat dairy, alcohol moderation, and smoking cessation.;Monitor prescription use compliance.    Expected Outcomes  Short Term: Continued assessment and intervention until BP is < 140/72m HG in hypertensive participants. < 130/823mHG in hypertensive participants with diabetes, heart failure or chronic kidney disease.;Long Term: Maintenance of blood pressure at goal levels.    Lipids  Yes    Intervention  Provide education and support for participant on nutrition & aerobic/resistive exercise along with prescribed medications to achieve LDL <7044mHDL >3m52m   Expected Outcomes  Short Term: Participant states understanding of desired cholesterol values and is compliant with medications prescribed. Participant is following exercise prescription and nutrition guidelines.;Long Term: Cholesterol controlled with medications as prescribed, with individualized exercise RX and with personalized nutrition plan. Value goals: LDL < 70mg56mL > 40 mg.       Core Components/Risk Factors/Patient Goals Review:  Goals and Risk Factor Review    Row Name 01/13/19 1651             Core Components/Risk Factors/Patient Goals Review   Personal Goals Review  Weight Management/Obesity;Improve shortness of breath with ADL's;Hypertension       Review  Darius Darius Beltsbeen doing well in the program and is on his way to breathing easier. He is trying to improve his ADl's. His blood pressure has been ok since the start of the program. He still gets a little winded before starting class but has a positive attitude.       Expected Outcomes  Short: Attend [name] regularly to improve shortness of breath with ADL's. Long: maintain independence with ADL's           Core Components/Risk Factors/Patient Goals at Discharge (Final Review):  Goals and Risk Factor Review - 01/13/19 1651      Core Components/Risk Factors/Patient Goals Review   Personal Goals Review  Weight Management/Obesity;Improve shortness of breath with ADL's;Hypertension    Review  Darius Darius Beltsbeen doing well in the program and is on his way to breathing easier. He is trying to improve his ADl's. His blood pressure has been ok since the start of the program. He still gets a little winded before starting class but has a positive attitude.    Expected Outcomes  Short: Attend [name] regularly to improve shortness of breath with ADL's. Long: maintain independence with ADL's        ITP Comments: ITP Comments    Row Name 12/23/18 1525 12/30/18 0849 01/27/19 0842       ITP Comments  Medical Evaluation completed.  Chart sent for review and changes to Dr. Mark Emily Filbertctor of [facility] Rockgnosis can be found in Care everywhere/media.  30 day review completed. ITP sent to Dr. Mark Emily Filbertctor of [facility] with ITP unless changes are made by physician.  30 day review completed. ITP sent to Dr. Mark Emily Filbertctor of [facility] with ITP unless changes are made by physician.        Comments: 30 day review

## 2019-01-29 DIAGNOSIS — J449 Chronic obstructive pulmonary disease, unspecified: Secondary | ICD-10-CM

## 2019-01-29 NOTE — Progress Notes (Signed)
Daily Session Note  Patient Details  Name: Darius Norris MRN: [id number] Date of Birth: 01/04/1951 Referring Provider:     Pulmonary Rehab from 12/23/2018 in [facility] Cardiac and Pulmonary Rehab  Referring Provider  Vernelle Emerald MD      Encounter Date: 01/29/2019  Check In: Session Check In - 01/29/19 1141      Check-In   Supervising physician immediately available to respond to emergencies  [name] immediately available ER MD    Physician(s)  Dr. Kerman Passey and Dr. Mariea Clonts    Location  [facility]-Cardiac & Pulmonary Rehab    Staff Present  Jasper Loser BS, Exercise Physiologist;Jessica Luan Pulling, MA, RCEP, CCRP, Exercise Physiologist;Joseph Tessie Fass RCP,RRT,BSRT    Medication changes reported      No    Fall or balance concerns reported     No    Tobacco Cessation  No Change    Warm-up and Cool-down  Performed as group-led instruction    Resistance Training Performed  Yes    VAD Patient?  No    PAD/SET Patient?  No      Pain Assessment   Currently in Pain?  No/denies    Multiple Pain Sites  No          Social History   Tobacco Use  Smoking Status Former Smoker  . Packs/day: 1.00  . Years: 30.00  . Pack years: 30.00  . Types: Cigarettes  . Last attempt to quit: 12/18/2001  . Years since quitting: 17.1  Smokeless Tobacco Never Used    Goals Met:  Proper associated with RPD/PD & O2 Sat Independence with exercise equipment Exercise tolerated well Strength training completed today  Goals Unmet:  Not Applicable  Comments: Pt able to follow exercise prescription today without complaint.  Will continue to monitor for progression.    Dr. Emily Filbert is Medical Director for [place] and [name].

## 2019-01-31 ENCOUNTER — Encounter: Payer: No Typology Code available for payment source | Admitting: *Deleted

## 2019-01-31 DIAGNOSIS — J449 Chronic obstructive pulmonary disease, unspecified: Secondary | ICD-10-CM | POA: Diagnosis not present

## 2019-01-31 NOTE — Progress Notes (Signed)
Daily Session Note  Patient Details  Name: Darius Norris MRN: [id number] Date of Birth: Dec 26, 1950 Referring Provider:     Pulmonary Rehab from 12/23/2018 in [facility] Cardiac and Pulmonary Rehab  Referring Provider  Vernelle Emerald MD      Encounter Date: 01/31/2019  Check In: Session Check In - 01/31/19 1224      Check-In   Supervising physician immediately available to respond to emergencies  [name] immediately available ER MD    Physician(s)  Dr. Cherylann Banas and Clearnce Hasten    Location  [facility]-Cardiac & Pulmonary Rehab    Staff Present  Renita Papa, RN BSN;Jessica Luan Pulling, MA, RCEP, CCRP, Exercise Physiologist;Joseph Tessie Fass RCP,RRT,BSRT    Medication changes reported      No    Fall or balance concerns reported     No    Warm-up and Cool-down  Performed as group-led instruction    Resistance Training Performed  Yes    VAD Patient?  No    PAD/SET Patient?  No      Pain Assessment   Currently in Pain?  No/denies          Social History   Tobacco Use  Smoking Status Former Smoker  . Packs/day: 1.00  . Years: 30.00  . Pack years: 30.00  . Types: Cigarettes  . Last attempt to quit: 12/18/2001  . Years since quitting: 17.1  Smokeless Tobacco Never Used    Goals Met:  Proper associated with RPD/PD & O2 Sat Independence with exercise equipment Using PLB without cueing & demonstrates good technique Exercise tolerated well No report of cardiac concerns or symptoms Strength training completed today  Goals Unmet:  Not Applicable  Comments: Pt able to follow exercise prescription today without complaint.  Will continue to monitor for progression.    Dr. Emily Filbert is Medical Director for [place] and [name].

## 2019-02-03 DIAGNOSIS — J449 Chronic obstructive pulmonary disease, unspecified: Secondary | ICD-10-CM

## 2019-02-03 NOTE — Progress Notes (Signed)
Daily Session Note  Patient Details  Name: Darius Norris MRN: [id number] Date of Birth: December 27, 1950 Referring Provider:     Pulmonary Rehab from 12/23/2018 in [facility] Cardiac and Pulmonary Rehab  Referring Provider  Vernelle Emerald MD      Encounter Date: 02/03/2019  Check In:      Social History   Tobacco Use  Smoking Status Former Smoker  . Packs/day: 1.00  . Years: 30.00  . Pack years: 30.00  . Types: Cigarettes  . Last attempt to quit: 12/18/2001  . Years since quitting: 17.1  Smokeless Tobacco Never Used    Goals Met:  Proper associated with RPD/PD & O2 Sat Independence with exercise equipment Exercise tolerated well Strength training completed today  Goals Unmet:  Not Applicable  Comments: Pt able to follow exercise prescription today without complaint.  Will continue to monitor for progression.    Dr. Emily Filbert is Medical Director for [place] and [name].

## 2019-02-05 ENCOUNTER — Encounter: Payer: No Typology Code available for payment source | Admitting: *Deleted

## 2019-02-05 DIAGNOSIS — J449 Chronic obstructive pulmonary disease, unspecified: Secondary | ICD-10-CM | POA: Diagnosis not present

## 2019-02-05 NOTE — Progress Notes (Signed)
Daily Session Note  Patient Details  Name: Darius Norris MRN: [id number] Date of Birth: 1951-08-04 Referring Provider:     Pulmonary Rehab from 12/23/2018 in [facility] Cardiac and Pulmonary Rehab  Referring Provider  Vernelle Emerald MD      Encounter Date: 02/05/2019  Check In: Session Check In - 02/05/19 1132      Check-In   Supervising physician immediately available to respond to emergencies  [name] immediately available ER MD    Physician(s)  Dr. Joni Fears and Jimmye Norman    Location  [facility]-Cardiac & Pulmonary Rehab    Staff Present  Renita Papa, RN BSN;Jessica Luan Pulling, MA, RCEP, CCRP, Exercise Physiologist;Joseph Tessie Fass RCP,RRT,BSRT    Medication changes reported      No    Fall or balance concerns reported     No    Tobacco Cessation  No Change    Warm-up and Cool-down  Performed as group-led instruction    Resistance Training Performed  Yes    VAD Patient?  No    PAD/SET Patient?  No      Pain Assessment   Currently in Pain?  No/denies          Social History   Tobacco Use  Smoking Status Former Smoker  . Packs/day: 1.00  . Years: 30.00  . Pack years: 30.00  . Types: Cigarettes  . Last attempt to quit: 12/18/2001  . Years since quitting: 17.1  Smokeless Tobacco Never Used    Goals Met:  Proper associated with RPD/PD & O2 Sat Independence with exercise equipment Using PLB without cueing & demonstrates good technique Exercise tolerated well No report of cardiac concerns or symptoms Strength training completed today  Goals Unmet:  Not Applicable  Comments: Pt able to follow exercise prescription today without complaint.  Will continue to monitor for progression.    Dr. Emily Filbert is Medical Director for [facility] and [name].

## 2019-02-10 DIAGNOSIS — J449 Chronic obstructive pulmonary disease, unspecified: Secondary | ICD-10-CM

## 2019-02-10 NOTE — Progress Notes (Signed)
Daily Session Note  Patient Details  Name: Darius Norris MRN: [id number] Date of Birth: 11/20/1951 Referring Provider:     Pulmonary Rehab from 12/23/2018 in [facility] Cardiac and Pulmonary Rehab  Referring Provider  Vernelle Emerald MD      Encounter Date: 02/10/2019  Check In:      Social History   Tobacco Use  Smoking Status Former Smoker  . Packs/day: 1.00  . Years: 30.00  . Pack years: 30.00  . Types: Cigarettes  . Last attempt to quit: 12/18/2001  . Years since quitting: 17.1  Smokeless Tobacco Never Used    Goals Met:  Independence with exercise equipment Exercise tolerated well No report of cardiac concerns or symptoms Strength training completed today  Goals Unmet:  Not Applicable  Comments: Pt able to follow exercise prescription today without complaint.  Will continue to monitor for progression.    Dr. Emily Filbert is Medical Director for [facility] and [name].

## 2019-02-12 ENCOUNTER — Encounter: Payer: No Typology Code available for payment source | Admitting: *Deleted

## 2019-02-12 DIAGNOSIS — J449 Chronic obstructive pulmonary disease, unspecified: Secondary | ICD-10-CM

## 2019-02-12 NOTE — Progress Notes (Signed)
Daily Session Note  Patient Details  Name: Darius Norris MRN: [id number] Date of Birth: 1951-04-13 Referring Provider:     Pulmonary Rehab from 12/23/2018 in [facility] Cardiac and Pulmonary Rehab  Referring Provider  Vernelle Emerald MD      Encounter Date: 02/12/2019  Check In: Session Check In - 02/12/19 1120      Check-In   Supervising physician immediately available to respond to emergencies  [name] immediately available ER MD    Physician(s)  Drs. Guadalupe Dawn    Location  [facility]-Cardiac & Pulmonary Rehab    Staff Present  Jasper Loser BS, Exercise Physiologist;Jessica Luan Pulling, MA, RCEP, CCRP, Exercise Physiologist;Meredith Sherryll Burger, RN BSN    Medication changes reported      No    Fall or balance concerns reported     No    Warm-up and Cool-down  Performed as group-led Higher education careers adviser Performed  Yes    VAD Patient?  No    PAD/SET Patient?  No      Pain Assessment   Currently in Pain?  No/denies          Social History   Tobacco Use  Smoking Status Former Smoker  . Packs/day: 1.00  . Years: 30.00  . Pack years: 30.00  . Types: Cigarettes  . Last attempt to quit: 12/18/2001  . Years since quitting: 17.1  Smokeless Tobacco Never Used    Goals Met:  Proper associated with RPD/PD & O2 Sat Independence with exercise equipment Using PLB without cueing & demonstrates good technique Exercise tolerated well No report of cardiac concerns or symptoms Strength training completed today  Goals Unmet:  Not Applicable  Comments: Pt able to follow exercise prescription today without complaint.  Will continue to monitor for progression.    Dr. Emily Filbert is Medical Director for [place] and [name].

## 2019-02-17 ENCOUNTER — Encounter: Payer: No Typology Code available for payment source | Attending: Internal Medicine

## 2019-02-17 DIAGNOSIS — J449 Chronic obstructive pulmonary disease, unspecified: Secondary | ICD-10-CM | POA: Insufficient documentation

## 2019-02-19 ENCOUNTER — Encounter: Payer: No Typology Code available for payment source | Admitting: *Deleted

## 2019-02-19 DIAGNOSIS — J449 Chronic obstructive pulmonary disease, unspecified: Secondary | ICD-10-CM

## 2019-02-19 NOTE — Progress Notes (Signed)
Daily Session Note  Patient Details  Name: Darius Norris MRN: [id number] Date of Birth: 06/25/1951 Referring Provider:     Pulmonary Rehab from 12/23/2018 in [facility] Cardiac and Pulmonary Rehab  Referring Provider  Oddone, Eugene MD      Encounter Date: 02/19/2019  Check In: Session Check In - 02/19/19 1134      Check-In   Supervising physician immediately available to respond to emergencies  [name] immediately available ER MD    Physician(s)  Dr. Kinner and Stafford    Location  [facility]-Cardiac & Pulmonary Rehab    Staff Present   , RN BSN;Jessica Hawkins, MA, RCEP, CCRP, Exercise Physiologist;Joseph Hood RCP,RRT,BSRT    Medication changes reported      No    Fall or balance concerns reported     No    Tobacco Cessation  No Change    Warm-up and Cool-down  Performed as group-led instruction    Resistance Training Performed  Yes    VAD Patient?  No    PAD/SET Patient?  No      Pain Assessment   Currently in Pain?  No/denies          Social History   Tobacco Use  Smoking Status Former Smoker  . Packs/day: 1.00  . Years: 30.00  . Pack years: 30.00  . Types: Cigarettes  . Last attempt to quit: 12/18/2001  . Years since quitting: 17.1  Smokeless Tobacco Never Used    Goals Met:  Proper associated with RPD/PD & O2 Sat Independence with exercise equipment Using PLB without cueing & demonstrates good technique Exercise tolerated well Personal goals reviewed No report of cardiac concerns or symptoms Strength training completed today  Goals Unmet:  Not Applicable  Comments: Pt able to follow exercise prescription today without complaint.  Will continue to monitor for progression.    Dr. Mark Miller is Medical Director for [facility] and [name]. 

## 2019-02-24 DIAGNOSIS — J449 Chronic obstructive pulmonary disease, unspecified: Secondary | ICD-10-CM

## 2019-02-24 NOTE — Progress Notes (Signed)
Pulmonary Individual Treatment Plan  Patient Details  Name: Darius Norris MRN: [id number] Date of Birth: 12-15-1951 Referring Provider:     Pulmonary Rehab from 12/23/2018 in [facility] Cardiac and Pulmonary Rehab  Referring Provider  Vernelle Emerald MD      Initial Encounter Date:    Pulmonary Rehab from 12/23/2018 in [facility] Cardiac and Pulmonary Rehab  Date  12/23/18      Visit Diagnosis: Chronic obstructive pulmonary disease, unspecified COPD type ([place])  Patient's Home Medications on Admission:  Current Outpatient Medications:  .  acetaminophen (TYLENOL) 325 MG tablet, Take 650 mg by mouth every 6 (six) hours as needed. , Disp: , Rfl:  .  amitriptyline (ELAVIL) 10 MG tablet, Take 15 mg by mouth at bedtime. , Disp: , Rfl:  .  aspirin EC 81 MG EC tablet, Take 1 tablet (81 mg total) by mouth daily., Disp: 180 tablet, Rfl: 0 .  atorvastatin (LIPITOR) 80 MG tablet, Take 1 tablet (80 mg total) by mouth at bedtime., Disp: 30 tablet, Rfl: 0 .  budesonide-formoterol (SYMBICORT) 160-4.5 MCG/ACT inhaler, Inhale 2 puffs into the lungs 2 (two) times daily., Disp: , Rfl:  .  busPIRone (BUSPAR) 10 MG tablet, Take 10 mg by mouth 2 (two) times daily., Disp: , Rfl:  .  clobetasol cream (TEMOVATE) 0.62 %, Apply 1 application topically daily as needed. For itching on psoriasis areas, Disp: , Rfl:  .  dabigatran (PRADAXA) 150 MG CAPS capsule, Take 150 mg by mouth 2 (two) times daily., Disp: , Rfl:  .  diltiazem (CARDIZEM CD) 180 MG 24 hr capsule, Take 1 capsule (180 mg total) by mouth daily., Disp: 90 capsule, Rfl: 0 .  fluticasone (FLONASE) 50 MCG/ACT nasal spray, Place 2 sprays into both nostrils daily., Disp: , Rfl:  .  gabapentin (NEURONTIN) 100 MG capsule, Take 100 mg by mouth at bedtime., Disp: , Rfl:  .  Ipratropium-Albuterol (COMBIVENT RESPIMAT) 20-100 MCG/ACT AERS respimat, Inhale 1 puff into the lungs every 6 (six) hours as needed for wheezing or shortness of breath., Disp: , Rfl:  .  isosorbide  mononitrate (IMDUR) 60 MG 24 hr tablet, Take 60 mg by mouth daily., Disp: , Rfl:  .  lisinopril (PRINIVIL,ZESTRIL) 40 MG tablet, Take 20 mg by mouth daily. , Disp: , Rfl:  .  loratadine (CLARITIN) 10 MG tablet, Take 10 mg by mouth daily as needed for allergies. , Disp: , Rfl:  .  metoprolol tartrate (LOPRESSOR) 100 MG tablet, Take 1 tablet (100 mg total) by mouth 2 (two) times daily., Disp: 60 tablet, Rfl: 0 .  omeprazole (PRILOSEC) 20 MG capsule, Take 20 mg by mouth daily. , Disp: , Rfl:  .  potassium chloride (K-DUR,KLOR-CON) 10 MEQ tablet, Take 10 mEq by mouth daily., Disp: , Rfl:  .  senna (SENOKOT) 8.6 MG TABS tablet, Take 1 tablet by mouth daily as needed for mild constipation., Disp: , Rfl:  .  traZODone (DESYREL) 50 MG tablet, Take 1 tablet (50 mg total) by mouth at bedtime as needed for sleep., Disp: 30 tablet, Rfl: 0  Past Medical History: Past Medical History:  Diagnosis Date  . A-fib ([place])   . CAD (coronary artery disease)   . Colitis   . COPD (chronic obstructive pulmonary disease) ([place])   . Emphysema lung ([place])   . GERD (gastroesophageal reflux disease)   . Hypertension   . MI (myocardial infarction) ([place]) 1999    Tobacco Use: Social History   Tobacco Use  Smoking Status Former  Smoker  . Packs/day: 1.00  . Years: 30.00  . Pack years: 30.00  . Types: Cigarettes  . Last attempt to quit: 12/18/2001  . Years since quitting: 17.1  Smokeless Tobacco Never Used    Labs: Recent Chemical engineer    Labs for ITP Cardiac and Pulmonary Rehab Latest Ref Rng & Units 02/04/2013 09/04/2016 05/02/2018 01/05/2019   Cholestrol 0 - 200 mg/dL 185 116 - -   LDLCALC 0 - 99 mg/dL 114(H) 51 - -   HDL >40 mg/dL 38(L) 42 - -   Trlycerides <150 mg/dL 166 116 - -   Hemoglobin A1c 4.8 - 5.6 % - 6.4(H) - -   HCO3 20.0 - 28.0 mmol/L - - 36.3(H) 29.6(H)   O2SAT % - - - 81.7       Pulmonary Assessment Scores: Pulmonary Assessment Scores    Row Name 12/23/18 1425         ADL UCSD    ADL Phase  Entry     SOB Score total  90     Rest  3     Walk  4     Stairs  5     Bath  4     Dress  2     Shop  3       CAT Score   CAT Score  24       mMRC Score   mMRC Score  2        Pulmonary Function Assessment: Pulmonary Function Assessment - 12/23/18 1423      Post Bronchodilator Spirometry Results   Comments  results taken from 06/27/16      Breath   Bilateral Breath Sounds  Clear    Shortness of Breath  Yes;Fear of Shortness of Breath;Limiting activity       Exercise Target Goals: Exercise Program Goal: Individual exercise prescription set using results from initial 6 min walk test and THRR while considering  patient's activity barriers and safety.   Exercise Prescription Goal: Initial exercise prescription builds to 30-45 minutes a day of aerobic activity, 2-3 days per week.  Home exercise guidelines will be given to patient during program as part of exercise prescription that the participant will acknowledge.  Activity Barriers & Risk Stratification: Activity Barriers & Cardiac Risk Stratification - 12/23/18 1517      Activity Barriers & Cardiac Risk Stratification   Activity Barriers  Shortness of Breath;Deconditioning;Back Problems;Muscular Weakness;Balance Concerns;Assistive Device   chronic back pain      6 Minute Walk: 6 Minute Walk    Row Name 12/23/18 1510         6 Minute Walk   Phase  Initial     Distance  523 feet     Walk Time  3.92 minutes     # of Rest Breaks  2 63 sec; 62 sec     MPH  1.52     METS  1.57     RPE  11     Perceived Dyspnea   3     VO2 Peak  5.49     Symptoms  Yes (comment)     Comments  SOB; back pain 6/10     Resting HR  67 bpm     Resting BP  126/64     Resting Oxygen Saturation   94 %     Exercise Oxygen Saturation  during 6 min walk  82 %     Max Ex. HR  99  bpm     Max Ex. BP  164/62     2 Minute Post BP  140/66       Interval HR   1 Minute HR  94     2 Minute HR  92     3 Minute HR  91     4  Minute HR  99     5 Minute HR  93     6 Minute HR  98     2 Minute Post HR  81     Interval Heart Rate?  Yes       Interval Oxygen   Interval Oxygen?  Yes     Baseline Oxygen Saturation %  94 %     1 Minute Oxygen Saturation %  86 % seated rest 1:17-2:20     1 Minute Liters of Oxygen  2 L     2 Minute Oxygen Saturation %  82 %     2 Minute Liters of Oxygen  2 L     3 Minute Oxygen Saturation %  88 % 3:43-4:45     3 Minute Liters of Oxygen  2 L     4 Minute Oxygen Saturation %  82 %     4 Minute Liters of Oxygen  2 L     5 Minute Oxygen Saturation %  90 %     5 Minute Liters of Oxygen  2 L     6 Minute Oxygen Saturation %  83 %     6 Minute Liters of Oxygen  2 L     2 Minute Post Oxygen Saturation %  93 %     2 Minute Post Liters of Oxygen  2 L       Oxygen Initial Assessment: Oxygen Initial Assessment - 12/23/18 1413      Home Oxygen   Home Oxygen Device  Home Concentrator;E-Tanks    Sleep Oxygen Prescription  Continuous    Liters per minute  2    Home Exercise Oxygen Prescription  Continuous    Liters per minute  2    Home at Rest Exercise Oxygen Prescription  Continuous    Liters per minute  2    Compliance with Home Oxygen Use  Yes      Initial 6 min Walk   Oxygen Used  Continuous    Liters per minute  2      Program Oxygen Prescription   Program Oxygen Prescription  Continuous    Liters per minute  2      Intervention   Short Term Goals  To learn and exhibit compliance with exercise, home and travel O2 prescription;To learn and understand importance of monitoring SPO2 with pulse oximeter and demonstrate accurate use of the pulse oximeter.;To learn and understand importance of maintaining oxygen saturations>88%;To learn and demonstrate proper pursed lip breathing techniques or other breathing techniques.;To learn and demonstrate proper use of respiratory medications    Long  Term Goals  Exhibits compliance with exercise, home and travel O2 prescription;Verbalizes  importance of monitoring SPO2 with pulse oximeter and return demonstration;Maintenance of O2 saturations>88%;Exhibits proper breathing techniques, such as pursed lip breathing or other method taught during program session;Compliance with respiratory medication;Demonstrates proper use of MDI's       Oxygen Re-Evaluation: Oxygen Re-Evaluation    Row Name 12/30/18 1422 02/05/19 1423           Program Oxygen Prescription   Program Oxygen Prescription  Continuous  Continuous      Liters per minute  2  3        Home Oxygen   Home Oxygen Device  Home Concentrator;E-Tanks  Home Concentrator;E-Tanks      Sleep Oxygen Prescription  Continuous  Continuous      Liters per minute  2  2      Home Exercise Oxygen Prescription  Continuous  -      Liters per minute  2  2      Home at Rest Exercise Oxygen Prescription  Continuous  Continuous      Liters per minute  2  2      Compliance with Home Oxygen Use  Yes  Yes        Goals/Expected Outcomes   Short Term Goals  To learn and exhibit compliance with exercise, home and travel O2 prescription;To learn and understand importance of monitoring SPO2 with pulse oximeter and demonstrate accurate use of the pulse oximeter.;To learn and understand importance of maintaining oxygen saturations>88%;To learn and demonstrate proper pursed lip breathing techniques or other breathing techniques.;To learn and demonstrate proper use of respiratory medications  To learn and exhibit compliance with exercise, home and travel O2 prescription;To learn and understand importance of monitoring SPO2 with pulse oximeter and demonstrate accurate use of the pulse oximeter.;To learn and understand importance of maintaining oxygen saturations>88%;To learn and demonstrate proper pursed lip breathing techniques or other breathing techniques.;To learn and demonstrate proper use of respiratory medications      Long  Term Goals  Exhibits compliance with exercise, home and travel O2  prescription;Verbalizes importance of monitoring SPO2 with pulse oximeter and return demonstration;Maintenance of O2 saturations>88%;Exhibits proper breathing techniques, such as pursed lip breathing or other method taught during program session;Compliance with respiratory medication;Demonstrates proper use of MDI's  Exhibits compliance with exercise, home and travel O2 prescription;Verbalizes importance of monitoring SPO2 with pulse oximeter and return demonstration;Maintenance of O2 saturations>88%;Exhibits proper breathing techniques, such as pursed lip breathing or other method taught during program session;Compliance with respiratory medication;Demonstrates proper use of MDI's      Comments  Reviewed PLB technique with pt.  Talked about how it work and it's important to maintaining his exercise saturations.    Spoke to patient about COPD Action Plan. Reviewed and talked with the patient about the different levels of COPD severity that they should review on a daily basis and the steps they can take to manage their COPD. Went over pertinent actions for the patient can take when they feel like they are having a COPD exacerbation and the necessary treatment if needed. If patient has any changes with their breathing or feel like they are in a different zone of severity to inform staff for re-evaluation. Patient verbalizes understanding. Copy given to patient. Patient feels like he is in the yellow zone when he does not get enough sleep. He has not had an exacerbation in over a year.      Goals/Expected Outcomes  Short: Become more profiecient at using PLB.   Long: Become independent at using PLB.  Short: Follow COPD action plan. Long: Report any changes and severity of COPD.         Oxygen Discharge (Final Oxygen Re-Evaluation): Oxygen Re-Evaluation - 02/05/19 1423      Program Oxygen Prescription   Program Oxygen Prescription  Continuous    Liters per minute  3      Home Oxygen   Home Oxygen Device   Home Concentrator;E-Tanks  Sleep Oxygen Prescription  Continuous    Liters per minute  2    Liters per minute  2    Home at Rest Exercise Oxygen Prescription  Continuous    Liters per minute  2    Compliance with Home Oxygen Use  Yes      Goals/Expected Outcomes   Short Term Goals  To learn and exhibit compliance with exercise, home and travel O2 prescription;To learn and understand importance of monitoring SPO2 with pulse oximeter and demonstrate accurate use of the pulse oximeter.;To learn and understand importance of maintaining oxygen saturations>88%;To learn and demonstrate proper pursed lip breathing techniques or other breathing techniques.;To learn and demonstrate proper use of respiratory medications    Long  Term Goals  Exhibits compliance with exercise, home and travel O2 prescription;Verbalizes importance of monitoring SPO2 with pulse oximeter and return demonstration;Maintenance of O2 saturations>88%;Exhibits proper breathing techniques, such as pursed lip breathing or other method taught during program session;Compliance with respiratory medication;Demonstrates proper use of MDI's    Comments  Spoke to patient about COPD Action Plan. Reviewed and talked with the patient about the different levels of COPD severity that they should review on a daily basis and the steps they can take to manage their COPD. Went over pertinent actions for the patient can take when they feel like they are having a COPD exacerbation and the necessary treatment if needed. If patient has any changes with their breathing or feel like they are in a different zone of severity to inform staff for re-evaluation. Patient verbalizes understanding. Copy given to patient. Patient feels like he is in the yellow zone when he does not get enough sleep. He has not had an exacerbation in over a year.    Goals/Expected Outcomes  Short: Follow COPD action plan. Long: Report any changes and severity of COPD.       Initial  Exercise Prescription: Initial Exercise Prescription - 12/23/18 1500      Date of Initial Exercise RX and Referring Provider   Date  12/23/18    Referring Provider  Vernelle Emerald MD      Oxygen   Oxygen  Continuous    Liters  3      Treadmill   MPH  1    Grade  0.5    Minutes  15    METs  1.83      NuStep   Level  1    SPM  80    Minutes  15    METs  1.8      REL-XR   Level  1    Speed  50    Minutes  15    METs  1.8      Prescription Details   Frequency (times per week)  3    Duration  Progress to 45 minutes of aerobic exercise without signs/symptoms of physical distress      Intensity   THRR 40-80% of Max Heartrate  101-136    Ratings of Perceived Exertion  11-13    Perceived Dyspnea  0-4      Progression   Progression  Continue to progress workloads to maintain intensity without signs/symptoms of physical distress.      Resistance Training   Training Prescription  Yes    Weight  3 lbs    Reps  10-15       Perform Capillary Blood Glucose checks as needed.  Exercise Prescription Changes: Exercise Prescription Changes    Row Name  12/23/18 1500 12/30/18 1600 01/01/19 1200 01/14/19 1300 01/28/19 1200     Response to Exercise   Blood Pressure (Admit)  126/64  130/80  -  134/68  140/82   Blood Pressure (Exercise)  164/62  138/64  -  -  -   Blood Pressure (Exit)  140/66  132/82  -  108/60  118/56   Heart Rate (Admit)  67 bpm  76 bpm  -  63 bpm  79 bpm   Heart Rate (Exercise)  99 bpm  73 bpm  -  78 bpm  93 bpm   Heart Rate (Exit)  81 bpm  71 bpm  -  63 bpm  78 bpm   Oxygen Saturation (Admit)  94 %  91 %  -  88 %  94 %   Oxygen Saturation (Exercise)  82 %  96 %  -  95 %  93 %   Oxygen Saturation (Exit)  93 %  94 %  -  97 %  98 %   Rating of Perceived Exertion (Exercise)  17  13  -  13  13   Perceived Dyspnea (Exercise)  3  2  -  2  2   Symptoms  back pain 6/10; SOB  none  -  SOB  SOB   Comments  walk test results  first full day of exercise  -  -  -    Duration  -  Progress to 45 minutes of aerobic exercise without signs/symptoms of physical distress  -  Continue with 45 min of aerobic exercise without signs/symptoms of physical distress.  Continue with 45 min of aerobic exercise without signs/symptoms of physical distress.   Intensity  -  THRR unchanged  -  THRR unchanged  THRR unchanged     Progression   Progression  -  Continue to progress workloads to maintain intensity without signs/symptoms of physical distress.  -  Continue to progress workloads to maintain intensity without signs/symptoms of physical distress.  Continue to progress workloads to maintain intensity without signs/symptoms of physical distress.   Average METs  -  1.81  -  2.28  2.24     Resistance Training   Training Prescription  -  Yes  -  Yes  Yes   Weight  -  3 lbs  -  3 lbs  3 lbs   Reps  -  10-15  -  10-15  10-15     Interval Training   Interval Training  -  No  -  No  -     Oxygen   Oxygen  -  Continuous  -  Continuous  -   Liters  -  3  -  3  -     Treadmill   MPH  -  1  -  1  1   Grade  -  0.5  -  0.5  0.5   Minutes  -  15  -  15  15   METs  -  1.83  -  1.83  1.83     NuStep   Level  -  1  -  2  2   Minutes  -  15  -  15  15   METs  -  2.3  -  2.5  2.5     REL-XR   Level  -  1  -  1  2   Minutes  -  15  -  15  15   METs  -  1.3  -  2.5  2.4     Home Exercise Plan   Plans to continue exercise at  -  -  Home (comment) walking, stationary bike  Home (comment) walking, stationary bike  Home (comment) walking, stationary bike   Frequency  -  -  Add 1 additional day to program exercise sessions.  Add 1 additional day to program exercise sessions.  Add 1 additional day to program exercise sessions.   Initial Home Exercises Provided  -  -  01/01/19  01/01/19  01/01/19   Row Name 02/11/19 1500             Response to Exercise   Blood Pressure (Admit)  138/82       Blood Pressure (Exit)  128/60       Heart Rate (Admit)  72 bpm       Heart Rate  (Exercise)  81 bpm       Heart Rate (Exit)  72 bpm       Oxygen Saturation (Admit)  96 %       Oxygen Saturation (Exercise)  94 %       Oxygen Saturation (Exit)  97 %       Rating of Perceived Exertion (Exercise)  13       Perceived Dyspnea (Exercise)  2       Symptoms  SOB       Duration  Continue with 45 min of aerobic exercise without signs/symptoms of physical distress.       Intensity  THRR unchanged         Progression   Progression  Continue to progress workloads to maintain intensity without signs/symptoms of physical distress.       Average METs  2.34         Resistance Training   Training Prescription  Yes       Weight  3 lbs       Reps  10-15         Interval Training   Interval Training  No         Oxygen   Oxygen  Continuous       Liters  3         Treadmill   MPH  1       Grade  0.5       Minutes  15       METs  1.83         NuStep   Level  3       Minutes  15       METs  2.4         REL-XR   Level  4       Minutes  15       METs  2.8         Home Exercise Plan   Plans to continue exercise at  Home (comment) walking, stationary bike       Frequency  Add 2 additional days to program exercise sessions.       Initial Home Exercises Provided  01/01/19          Exercise Comments: Exercise Comments    Row Name 12/30/18 1144           Exercise Comments   First full day of exercise!  Patient was oriented to gym and equipment including functions, settings, policies, and procedures.  Patient's individual exercise  prescription and treatment plan were reviewed.  All starting workloads were established based on the results of the 6 minute walk test done at initial orientation visit.  The plan for exercise progression was also introduced and progression will be customized based on patient's performance and goals.          Exercise Goals and Review: Exercise Goals    Row Name 12/23/18 1554             Exercise Goals   Increase Physical Activity   Yes       Intervention  Provide advice, education, support and counseling about physical activity/exercise needs.;Develop an individualized exercise prescription for aerobic and resistive training based on initial evaluation findings, risk stratification, comorbidities and participant's personal goals.       Expected Outcomes  Short Term: Attend rehab on a regular basis to increase amount of physical activity.;Long Term: Add in home exercise to make exercise part of routine and to increase amount of physical activity.;Long Term: Exercising regularly at least 3-5 days a week.       Increase Strength and Stamina  Yes       Intervention  Provide advice, education, support and counseling about physical activity/exercise needs.;Develop an individualized exercise prescription for aerobic and resistive training based on initial evaluation findings, risk stratification, comorbidities and participant's personal goals.       Expected Outcomes  Short Term: Increase workloads from initial exercise prescription for resistance, speed, and METs.;Short Term: Perform resistance training exercises routinely during rehab and add in resistance training at home;Long Term: Improve cardiorespiratory fitness, muscular endurance and strength as measured by increased METs and functional capacity (6MWT)       Able to understand and use rate of perceived exertion (RPE) scale  Yes       Intervention  Provide education and explanation on how to use RPE scale       Expected Outcomes  Short Term: Able to use RPE daily in rehab to express subjective intensity level;Long Term:  Able to use RPE to guide intensity level when exercising independently       Able to understand and use Dyspnea scale  Yes       Intervention  Provide education and explanation on how to use Dyspnea scale       Expected Outcomes  Short Term: Able to use Dyspnea scale daily in rehab to express subjective sense of shortness of breath during exertion;Long Term: Able  to use Dyspnea scale to guide intensity level when exercising independently       Knowledge and understanding of Target Heart Rate Range (THRR)  Yes       Intervention  Provide education and explanation of THRR including how the numbers were predicted and where they are located for reference       Expected Outcomes  Short Term: Able to state/look up THRR;Short Term: Able to use daily as guideline for intensity in rehab;Long Term: Able to use THRR to govern intensity when exercising independently       Able to check pulse independently  Yes       Intervention  Provide education and demonstration on how to check pulse in carotid and radial arteries.;Review the importance of being able to check your own pulse for safety during independent exercise       Expected Outcomes  Short Term: Able to explain why pulse checking is important during independent exercise;Long Term: Able to check pulse independently and accurately  Understanding of Exercise Prescription  Yes       Intervention  Provide education, explanation, and written materials on patient's individual exercise prescription       Expected Outcomes  Short Term: Able to explain program exercise prescription;Long Term: Able to explain home exercise prescription to exercise independently          Exercise Goals Re-Evaluation : Exercise Goals Re-Evaluation    Row Name 12/30/18 1144 01/01/19 1215 01/14/19 1353 01/28/19 1246 02/11/19 1501     Exercise Goal Re-Evaluation   Exercise Goals Review  Increase Physical Activity;Able to understand and use rate of perceived exertion (RPE) scale;Knowledge and understanding of Target Heart Rate Range (THRR);Understanding of Exercise Prescription;Increase Strength and Stamina;Able to understand and use Dyspnea scale  Increase Physical Activity;Able to understand and use rate of perceived exertion (RPE) scale;Knowledge and understanding of Target Heart Rate Range (THRR);Understanding of Exercise  Prescription;Increase Strength and Stamina;Able to understand and use Dyspnea scale;Able to check pulse independently  Increase Physical Activity;Increase Strength and Stamina;Understanding of Exercise Prescription  Increase Physical Activity;Increase Strength and Stamina;Understanding of Exercise Prescription  Increase Physical Activity;Increase Strength and Stamina;Understanding of Exercise Prescription   Comments  Reviewed RPE scale, THR and program prescription with pt today.  Pt voiced understanding and was given a copy of goals to take home.   Reviewed home exercise with pt today.  Pt plans to walking and ride his stationary bike for exercise.  Reviewed THR, pulse, RPE, sign and symptoms, and when to call 911 or MD.  Also discussed weather considerations and indoor options.  Pt voiced understanding.  Darius Norris has been doing well in rehab.  He is up to level 2 on the NuStep.  We will start to increase his other workloads as his sats are improving.  We will continue to monitor his progress.   Darius Norris continues to do well in rehab.  He is still a 13 on everything.  We will try to increase some workloads despite the RPEs.  We will continue to monitor her progress.   Darius Norris has been doing well in rehab.  He is starting to come down to a 12 on RPE.  We will increase some workloads.  We will continue to monitor his progression.    Expected Outcomes  Short: Use RPE daily to regulate intensity. Long: Follow program prescription in [facility]R.  Short: Start to add in exercise at home for at least one extra day a week.  Long: Continue to increase strength and stamina.   Short: Increase workloads.  Long: Continue to increase strength and stamina.   Short: Increase workloads. Long: Continue to rebuild stamina.   Short: Increase workloads.  Long: Continue to increase acitivity levels.       Discharge Exercise Prescription (Final Exercise Prescription Changes): Exercise Prescription Changes - 02/11/19 1500      Response to Exercise    Blood Pressure (Admit)  138/82    Blood Pressure (Exit)  128/60    Heart Rate (Admit)  72 bpm    Heart Rate (Exercise)  81 bpm    Heart Rate (Exit)  72 bpm    Oxygen Saturation (Admit)  96 %    Oxygen Saturation (Exercise)  94 %    Oxygen Saturation (Exit)  97 %    Rating of Perceived Exertion (Exercise)  13    Perceived Dyspnea (Exercise)  2    Symptoms  SOB    Duration  Continue with 45 min of aerobic exercise without signs/symptoms of physical  distress.    Intensity  THRR unchanged      Progression   Progression  Continue to progress workloads to maintain intensity without signs/symptoms of physical distress.    Average METs  2.34      Resistance Training   Training Prescription  Yes    Weight  3 lbs    Reps  10-15      Interval Training   Interval Training  No      Oxygen   Oxygen  Continuous    Liters  3      Treadmill   MPH  1    Grade  0.5    Minutes  15    METs  1.83      NuStep   Level  3    Minutes  15    METs  2.4      REL-XR   Level  4    Minutes  15    METs  2.8      Home Exercise Plan   Plans to continue exercise at  Home (comment)   walking, stationary bike   Frequency  Add 2 additional days to program exercise sessions.    Initial Home Exercises Provided  01/01/19       Nutrition:  Target Goals: Understanding of nutrition guidelines, daily intake of sodium <1547m, cholesterol <2058m calories 30% from fat and 7% or less from saturated fats, daily to have 5 or more servings of fruits and vegetables.  Biometrics: Pre Biometrics - 12/23/18 1554      Pre Biometrics   Height  _0  (1.676 m)    Weight  209 lb 6.4 oz (95 kg)    Waist Circumference  43 inches    Hip Circumference  47 inches    Waist to Hip Ratio  0.91 %    BMI (Calculated)  33.81    Single Leg Stand  1.41 seconds        Nutrition Therapy Plan and Nutrition Goals: Nutrition Therapy & Goals - 12/30/18 1232      Nutrition Therapy   Diet  TLC    Drug/Food  Interactions  Statins/Certain Fruits    Protein (specify units)  8-9oz    Fiber  30 grams    Whole Grain Foods  3 servings   not currently choosing whole grains   Saturated Fats  15 max. grams    Fruits and Vegetables  5 servings/day   8 ideal. eats more vegetables in the summer, trying to eat more fruits   Sodium  2000 grams      Personal Nutrition Goals   Nutrition Goal  Start to decrease soda intake. Replace one can with a glass of water each day as an initial goal    Personal Goal #2  Eat fruits and vegetables more regularly, paricularly vegetables. Great job for starting to eat grapes more often as part of your breakfast    Personal Goal #3  Start to become mindful of the amount of salt you are adding to meals    Comments  Pt reports recent wt gain of approx 8-10# d/t increased appetite r/t Prednisone. His wife comes home from work at [employer] so he eats 'lightly' until then. Breakfast: homemade biscuit with sausage or bacon, egg, grapes, cinnamon coffee cake. Snacks: pretzels, frozen fruit bar, [place], biscuit. Dinner: soup, sandwich, 'breakfast for dinner.' They eat 1-2 "substantial meals" / week for dinner IE meat + starch + vegetable. He likes beans, green  beans, potatoes, beef, chicken, seafood 1x/month, BBQ meal out 1x/month. Currently adds salt to meals as he feels that adding salt is the only way he is able to taste food      Intervention Plan   Intervention  Prescribe, educate and counsel regarding individualized specific dietary modifications aiming towards targeted core components such as weight, hypertension, lipid management, diabetes, heart failure and other comorbidities.;Nutrition handout(s) given to patient.   Dietary tips for better breathing   Expected Outcomes  Short Term Goal: Understand basic principles of dietary content, such as calories, fat, sodium, cholesterol and nutrients.;Short Term Goal: A plan has been developed with personal nutrition goals set during  dietitian appointment.;Long Term Goal: Adherence to prescribed nutrition plan.       Nutrition Assessments:   Nutrition Goals Re-Evaluation: Nutrition Goals Re-Evaluation    [place] Name 12/30/18 1246 02/19/19 1226           Goals   Current Weight  -  214 lb (97.1 kg)      Nutrition Goal  Start to decrease soda intake. Replace one can with a glass of water each day as an initial goal  Decrease soda intake, Lose weight      Comment  Currently drinking mostly Pepsi as beverage choices. Occasionally will drink water but is trying to be better about drinking more  Patient states that he is eating too much and eats later than he would like. Darius Norris is drinking about 16 ounces of regular pepsi a day. He is going to try to cut back to 6oz at breakfast and 6oz at dinner. Informed him that diet would be a better option for him to since it has no sugar. He states he and his wife eat dinner in front of the TV. Stated to patient that eating in front of the TV will most likely incline him to eat more.       Expected Outcome  Short term: replace one can of soda per day with water. Long term: eliminate soda from diet and drink non-sugar sweetened beverages instead  Short: decrease meal and soda intake and Lose 5 pounds. Long: have one soda a day and maintain weight loss.        Personal Goal #2 Re-Evaluation   Personal Goal #2  Eat fruits and vegetables more regularly, particularly vegetables. Great job for starting to eat grapes more often as part of your breakfast  -        Personal Goal #3 Re-Evaluation   Personal Goal #3  Start to become mindful of the amount of salt you are adding to meals  -         Nutrition Goals Discharge (Final Nutrition Goals Re-Evaluation): Nutrition Goals Re-Evaluation - 02/19/19 1226      Goals   Current Weight  214 lb (97.1 kg)    Nutrition Goal  Decrease soda intake, Lose weight    Comment  Patient states that he is eating too much and eats later than he would like. Darius Norris  is drinking about 16 ounces of regular pepsi a day. He is going to try to cut back to 6oz at breakfast and 6oz at dinner. Informed him that diet would be a better option for him to since it has no sugar. He states he and his wife eat dinner in front of the TV. Stated to patient that eating in front of the TV will most likely incline him to eat more.     Expected Outcome  Short:  decrease meal and soda intake and Lose 5 pounds. Long: have one soda a day and maintain weight loss.       Psychosocial: Target Goals: Acknowledge presence or absence of significant depression and/or stress, maximize coping skills, provide positive support system. Participant is able to verbalize types and ability to use techniques and skills needed for reducing stress and depression.   Initial Review & Psychosocial Screening: Initial Psych Review & Screening - 12/23/18 1415      Initial Review   Current issues with  Current Anxiety/Panic;Current Stress Concerns    Source of Stress Concerns  Chronic Illness    Comments  Sometimes has panic issues with shortness of breath.      Family Dynamics   Good Support System?  Yes    Comments  He can look to his wife and his two sisters for support.      Barriers   Psychosocial barriers to participate in program  The patient should benefit from training in stress management and relaxation.      Screening Interventions   Interventions  Encouraged to exercise;Program counselor consult;To provide support and resources with identified psychosocial needs;Provide feedback about the scores to participant    Expected Outcomes  Short Term goal: Utilizing psychosocial counselor, staff and physician to assist with identification of specific Stressors or current issues interfering with healing process. Setting desired goal for each stressor or current issue identified.;Long Term Goal: Stressors or current issues are controlled or eliminated.;Short Term goal: Identification and review with  participant of any Quality of Life or Depression concerns found by scoring the questionnaire.;Long Term goal: The participant improves quality of Life and PHQ9 Scores as seen by post scores and/or verbalization of changes       Quality of Life Scores:  Scores of 19 and below usually indicate a poorer quality of life in these areas.  A difference of  2-3 points is a clinically meaningful difference.  A difference of 2-3 points in the total score of the Quality of Life Index has been associated with significant improvement in overall quality of life, self-image, physical symptoms, and general health in studies assessing change in quality of life.  PHQ-9: Recent Review Flowsheet Data    Depression screen [facility] 2/9 01/29/2019 12/23/2018 11/20/2016 06/27/2016   Decreased Interest 0 2 1 0   Down, Depressed, Hopeless 0 0 1 0   PHQ - 2 Score 0 2 2 0   Altered sleeping _0 Tired, decreased energy _1 Change in appetite _2 Feeling bad or failure about yourself  0 0 0 0   Trouble concentrating 0 0 0 0   Moving slowly or fidgety/restless 0 1 0 0   Suicidal thoughts 0 0 0 0   PHQ-9 Score _3 Difficult doing work/chores Somewhat difficult Very difficult Very difficult Somewhat difficult     Interpretation of Total Score  Total Score Depression Severity:  1-4 = Minimal depression, 5-9 = Mild depression, 10-14 = Moderate depression, 15-19 = Moderately severe depression, 20-27 = Severe depression   Psychosocial Evaluation and Intervention: Psychosocial Evaluation - 01/13/19 1236      Psychosocial Evaluation & Interventions   Interventions  Stress management education;Encouraged to exercise with the program and follow exercise prescription    Comments  Mr. [place] Darius Norris) has returned to this program after several years being out.  He is 68 years old and  has COPD/Emphysema.  He has a strong support system with a spouse and daughter and (3) sisters who live locally.  He reports not  sleeping well with 3-4 hours at night and 3 hours in the afternoons.  Darius Norris states he had a sleep study done several years ago but no treatment.  His appetite is good.  He denies a history of depression or anxiety but his recent admissions mention anxiety with medication.  He reports typically being in a positive mood most of the time.  Darius Norris has multiple stressors with his health; that of his spouse; and finances.  He has goals to breathe better; exercise and get stronger and socialize in the process.  Staff will follow.     Expected Outcomes  Short:  Darius Norris will exercise for his health and mental health - stress reduction and hopefully sleep quality.  Long: Darius Norris will learn positive coping strategies including exercise and practice them consistently.     Continue Psychosocial Services   Follow up required by staff       Psychosocial Re-Evaluation: Psychosocial Re-Evaluation    [place] Name 01/29/19 1432             Psychosocial Re-Evaluation   Current issues with  Current Stress Concerns;Current Anxiety/Panic       Comments  Reviewed patient health questionnaire (PHQ-9) with patient for follow up. Previously, patients score indicated signs/symptoms of depression.  Reviewed to see if patient is improving symptom wise while in program.  Score improved and patient states that it is because he has been able to exercise, get out more and be social.       Expected Outcomes  Short: Continue to attend [name] regularly for regular exercise and social engagement. Long: Continue to improve symptoms and manage a positive mental state.       Interventions  Encouraged to attend Cardiac Rehabilitation for the exercise       Continue Psychosocial Services   Follow up required by staff          Psychosocial Discharge (Final Psychosocial Re-Evaluation): Psychosocial Re-Evaluation - 01/29/19 1432      Psychosocial Re-Evaluation   Current issues with  Current Stress Concerns;Current Anxiety/Panic    Comments   Reviewed patient health questionnaire (PHQ-9) with patient for follow up. Previously, patients score indicated signs/symptoms of depression.  Reviewed to see if patient is improving symptom wise while in program.  Score improved and patient states that it is because he has been able to exercise, get out more and be social.    Expected Outcomes  Short: Continue to attend [name] regularly for regular exercise and social engagement. Long: Continue to improve symptoms and manage a positive mental state.    Interventions  Encouraged to attend Cardiac Rehabilitation for the exercise    Continue Psychosocial Services   Follow up required by staff       Education: Education Goals: Education classes will be provided on a weekly basis, covering required topics. Participant will state understanding/return demonstration of topics presented.  Learning Barriers/Preferences: Learning Barriers/Preferences - 12/23/18 1417      Learning Barriers/Preferences   Learning Barriers  None    Learning Preferences  Group Instruction;Individual Instruction;Pictoral;Skilled Demonstration;Verbal Instruction;Video;Written Material       Education Topics:  Initial Evaluation Education: - Verbal, written and demonstration of respiratory meds, oximetry and breathing techniques. Instruction on use of nebulizers and MDIs and importance of monitoring MDI activations.   Pulmonary Rehab from 02/19/2019 in [facility] Cardiac and Pulmonary Rehab  Date  12/23/18  Educator  [facility]  Instruction Review Code  1- Verbalizes Understanding      General Nutrition Guidelines/Fats and Fiber: -Group instruction provided by verbal, written material, models and posters to present the general guidelines for heart healthy nutrition. Gives an explanation and review of dietary fats and fiber.   Pulmonary Rehab from 02/19/2019 in [facility] Cardiac and Pulmonary Rehab  Date  01/29/19  Educator  [facility]  Instruction Review Code  1- Verbalizes Understanding       Controlling Sodium/Reading Food Labels: -Group verbal and written material supporting the discussion of sodium use in heart healthy nutrition. Review and explanation with models, verbal and written materials for utilization of the food label.   Pulmonary Rehab from 02/19/2019 in [facility] Cardiac and Pulmonary Rehab  Date  02/05/19  Educator  [facility]  Instruction Review Code  1- Verbalizes Understanding      Exercise Physiology & General Exercise Guidelines: - Group verbal and written instruction with models to review the exercise physiology of the cardiovascular system and associated critical values. Provides general exercise guidelines with specific guidelines to those with heart or lung disease.    Pulmonary Rehab from 02/19/2019 in [facility] Cardiac and Pulmonary Rehab  Date  01/08/19  Educator  [facility]  Instruction Review Code  1- Verbalizes Understanding      Aerobic Exercise & Resistance Training: - Gives group verbal and written instruction on the various components of exercise. Focuses on aerobic and resistive training programs and the benefits of this training and how to safely progress through these programs.   Pulmonary Rehab from 02/19/2019 in [facility] Cardiac and Pulmonary Rehab  Date  01/10/19  Educator  [facility]  Instruction Review Code  1- Verbalizes Understanding      Flexibility, Balance, Mind/Body Relaxation: Provides group verbal/written instruction on the benefits of flexibility and balance training, including mind/body exercise modes such as yoga, pilates and tai chi.  Demonstration and skill practice provided.   Pulmonary Rehab from 02/19/2019 in [facility] Cardiac and Pulmonary Rehab  Date  01/15/19  Educator  AS  Instruction Review Code  1- Verbalizes Understanding      Stress and Anxiety: - Provides group verbal and written instruction about the health risks of elevated stress and causes of high stress.  Discuss the correlation between heart/lung disease and anxiety and treatment options.  Review healthy ways to manage with stress and anxiety.   Depression: - Provides group verbal and written instruction on the correlation between heart/lung disease and depressed mood, treatment options, and the stigmas associated with seeking treatment.   Pulmonary Rehab from 02/19/2019 in [facility] Cardiac and Pulmonary Rehab  Date  02/19/19  Educator  KG  Instruction Review Code  1- Verbalizes Understanding      Exercise & Equipment Safety: - Individual verbal instruction and demonstration of equipment use and safety with use of the equipment.   Pulmonary Rehab from 02/19/2019 in [facility] Cardiac and Pulmonary Rehab  Date  12/23/18  Educator  [facility]  Instruction Review Code  1- Verbalizes Understanding      Infection Prevention: - Provides verbal and written material to individual with discussion of infection control including proper hand washing and proper equipment cleaning during exercise session.   Pulmonary Rehab from 02/19/2019 in [facility] Cardiac and Pulmonary Rehab  Date  12/23/18  Educator  [facility]  Instruction Review Code  1- Verbalizes Understanding      Falls Prevention: - Provides verbal and written material to individual with discussion of falls prevention  and safety.   Pulmonary Rehab from 02/19/2019 in [facility] Cardiac and Pulmonary Rehab  Date  12/23/18  Educator  [facility]  Instruction Review Code  1- Verbalizes Understanding      Diabetes: - Individual verbal and written instruction to review signs/symptoms of diabetes, desired ranges of glucose level fasting, after meals and with exercise. Advice that pre and post exercise glucose checks will be done for 3 sessions at entry of program.   Chronic Lung Diseases: - Group verbal and written instruction to review updates, respiratory medications, advancements in procedures and treatments. Discuss use of supplemental oxygen including available portable oxygen systems, continuous and intermittent flow rates, concentrators, personal use and safety  guidelines. Review proper use of inhaler and spacers. Provide informative websites for self-education.    Pulmonary Rehab from 02/19/2019 in [facility] Cardiac and Pulmonary Rehab  Date  01/24/19  Educator  [facility]  Instruction Review Code  1- Verbalizes Understanding      Energy Conservation: - Provide group verbal and written instruction for methods to conserve energy, plan and organize activities. Instruct on pacing techniques, use of adaptive equipment and posture/positioning to relieve shortness of breath.   Pulmonary Rehab from 05/24/2015 in [place]  Date  05/12/15  Educator  SW  Instruction Review Code (retired)  2- meets goals/outcomes      Triggers and Exacerbations: - Group verbal and written instruction to review types of environmental triggers and ways to prevent exacerbations. Discuss weather changes, air quality and the benefits of nasal washing. Review warning signs and symptoms to help prevent infections. Discuss techniques for effective airway clearance, coughing, and vibrations.   AED/CPR: - Group verbal and written instruction with the use of models to demonstrate the basic use of the AED with the basic ABC's of resuscitation.   Pulmonary Rehab from 02/19/2019 in [facility] Cardiac and Pulmonary Rehab  Date  01/01/19  Educator  [facility]  Instruction Review Code  1- Actuary and Physiology of the Lungs: - Group verbal and written instruction with the use of models to provide basic lung anatomy and physiology related to function, structure and complications of lung disease.   Anatomy & Physiology of the Heart: - Group verbal and written instruction and models provide basic cardiac anatomy and physiology, with the coronary electrical and arterial systems. Review of Valvular disease and Heart Failure   Cardiac Medications: - Group verbal and written instruction to review commonly prescribed medications for heart disease.  Reviews the medication, class of the drug, and side effects.   Know Your Numbers and Risk Factors: -Group verbal and written instruction about important numbers in your health.  Discussion of what are risk factors and how they play a role in the disease process.  Review of Cholesterol, Blood Pressure, Diabetes, and BMI and the role they play in your overall health.   Pulmonary Rehab from 02/19/2019 in [facility] Cardiac and Pulmonary Rehab  Date  02/12/19  Educator  [facility]  Instruction Review Code  1- Verbalizes Understanding      Sleep Hygiene: -Provides group verbal and written instruction about how sleep can affect your health.  Define sleep hygiene, discuss sleep cycles and impact of sleep habits. Review good sleep hygiene tips.    Other: -Provides group and verbal instruction on various topics (see comments)    Knowledge Questionnaire Score: Knowledge Questionnaire Score - 12/23/18 1423      Knowledge Questionnaire Score   Pre Score  16/18  reviewed with patient       Core Components/Risk Factors/Patient Goals at Admission: Personal Goals and Risk Factors at Admission - 12/23/18 1418      Core Components/Risk Factors/Patient Goals on Admission    Weight Management  Yes;Weight Loss    Intervention  Weight Management: Develop a combined nutrition and exercise program designed to reach desired caloric intake, while maintaining appropriate intake of nutrient and fiber, sodium and fats, and appropriate energy expenditure required for the weight goal.;Weight Management: Provide education and appropriate resources to help participant work on and attain dietary goals.    Admit Weight  209 lb 6.4 oz (95 kg)    Goal Weight: Short Term  204 lb (92.5 kg)    Goal Weight: Long Term  180 lb (81.6 kg)    Expected Outcomes  Short Term: Continue to assess and modify interventions until short term weight is achieved;Long Term: Adherence to nutrition and physical activity/exercise program aimed toward  attainment of established weight goal;Weight Loss: Understanding of general recommendations for a balanced deficit meal plan, which promotes 1-2 lb weight loss per week and includes a negative energy balance of [phone] kcal/d;Understanding recommendations for meals to include 15-35% energy as protein, 25-35% energy from fat, 35-60% energy from carbohydrates, less than 279m of dietary cholesterol, 20-35 gm of total fiber daily;Understanding of distribution of calorie intake throughout the day with the consumption of 4-5 meals/snacks    Improve shortness of breath with ADL's  Yes    Intervention  Provide education, individualized exercise plan and daily activity instruction to help decrease symptoms of SOB with activities of daily living.    Expected Outcomes  Short Term: Improve cardiorespiratory fitness to achieve a reduction of symptoms when performing ADLs;Long Term: Be able to perform more ADLs without symptoms or delay the onset of symptoms    Hypertension  Yes   AFIB   Intervention  Provide education on lifestyle modifcations including regular physical activity/exercise, weight management, moderate sodium restriction and increased consumption of fresh fruit, vegetables, and low fat dairy, alcohol moderation, and smoking cessation.;Monitor prescription use compliance.    Expected Outcomes  Short Term: Continued assessment and intervention until BP is < 140/958mHG in hypertensive participants. < 130/80107mG in hypertensive participants with diabetes, heart failure or chronic kidney disease.;Long Term: Maintenance of blood pressure at goal levels.    Lipids  Yes    Intervention  Provide education and support for participant on nutrition & aerobic/resistive exercise along with prescribed medications to achieve LDL <15m24mDL >40mg68m Expected Outcomes  Short Term: Participant states understanding of desired cholesterol values and is compliant with medications prescribed. Participant is following  exercise prescription and nutrition guidelines.;Long Term: Cholesterol controlled with medications as prescribed, with individualized exercise RX and with personalized nutrition plan. Value goals: LDL < 15mg,15m > 40 mg.       Core Components/Risk Factors/Patient Goals Review:  Goals and Risk Factor Review    Row Name 01/13/19 1651 02/19/19 1233           Core Components/Risk Factors/Patient Goals Review   Personal Goals Review  Weight Management/Obesity;Improve shortness of breath with ADL's;Hypertension  Weight Management/Obesity;Improve shortness of breath with ADL's;Hypertension      Review  Darius Norris hRonalee Beltseen doing well in the program and is on his way to breathing easier. He is trying to improve his ADl's. His blood pressure has been ok since the start of the program. He still gets a little winded before  starting class but has a positive attitude.  Darius Norris had a blood pressure of 140s over 70 today. He has been talking to his doctor and switching up his medications. He has been hesitant to take lisinopril due to his heart rate. Sometimes the medications makes him feels a certain way and he is trying to figure it out. He states he fell out of bed last Friday night. He has neuropathy in his legs and sleeps at the edge of the bed. He had got a little bruised but nothing serious. Darius Norris wants to get a bed rail since he sleeps at the edge of the bed.      Expected Outcomes  Short: Attend [name] regularly to improve shortness of breath with ADL's. Long: maintain independence with ADL's   Short: figure out the best way to take his medications while monitoring HR. Long: maintain medication prescription.         Core Components/Risk Factors/Patient Goals at Discharge (Final Review):  Goals and Risk Factor Review - 02/19/19 1233      Core Components/Risk Factors/Patient Goals Review   Personal Goals Review  Weight Management/Obesity;Improve shortness of breath with ADL's;Hypertension    Review  Darius Norris had  a blood pressure of 140s over 70 today. He has been talking to his doctor and switching up his medications. He has been hesitant to take lisinopril due to his heart rate. Sometimes the medications makes him feels a certain way and he is trying to figure it out. He states he fell out of bed last Friday night. He has neuropathy in his legs and sleeps at the edge of the bed. He had got a little bruised but nothing serious. Darius Norris wants to get a bed rail since he sleeps at the edge of the bed.    Expected Outcomes  Short: figure out the best way to take his medications while monitoring HR. Long: maintain medication prescription.       ITP Comments: ITP Comments    Row Name 12/23/18 1525 12/30/18 0849 01/27/19 0842 02/24/19 0824     ITP Comments  Medical Evaluation completed. Chart sent for review and changes to Dr. Emily Filbert Director of [place]. Diagnosis can be found in Care everywhere/media.  30 day review completed. ITP sent to Dr. Emily Filbert Director of [place]. Continue with ITP unless changes are made by physician.  30 day review completed. ITP sent to Dr. Emily Filbert Director of [place]. Continue with ITP unless changes are made by physician.  30 day review completed. ITP sent to Dr. Emily Filbert Director of [place]. Continue with ITP unless changes are made by physician.       Comments: 30 day review

## 2019-02-26 ENCOUNTER — Encounter: Payer: No Typology Code available for payment source | Admitting: *Deleted

## 2019-02-26 ENCOUNTER — Other Ambulatory Visit: Payer: Self-pay

## 2019-02-26 DIAGNOSIS — J449 Chronic obstructive pulmonary disease, unspecified: Secondary | ICD-10-CM

## 2019-02-26 NOTE — Progress Notes (Signed)
Daily Session Note  Patient Details  Name: Darius Norris MRN: [id number] Date of Birth: 1951/08/22 Referring Provider:     Pulmonary Rehab from 12/23/2018 in [facility] Cardiac [facility] Pul[facility]  Referring Provider  Vernelle Emerald MD      Encounter Date: 02/26/2019  Check In: Session Check In - 02/26/19 1127      Check-In   Physician(s)  Drs. Glena Norfolk    Location  [facility]-Cardiac & Pulmonary Rehab    Staff Present  Renita Papa, RN BSN;Zell Doucette Luan Pulling, [place], RCEP, CCRP, Exercise Physiologist;Joseph Tessie Fass RCP,RRT,BSRT    Medication changes reported      No    Fall or balance concerns reported     No    Warm-up and Cool-down  Performed as group-led instruction    Resistance Training Performed  Yes    VAD Patient?  No    PAD/SET Patient?  No      Pain Assessment   Currently in Pain?  No/denies          Social History   Tobacco Use  Smoking Status Former Smoker  . Packs/day: 1.00  . Years: 30.00  . Pack years: 30.00  . Types: Cigarettes  . Last attempt to quit: 12/18/2001  . Years since quitting: 17.2  Smokeless Tobacco Never Used    Goals Met:  Proper associated with RPD/PD & O2 Sat Independence with exercise equipment Using PLB without cueing & demonstrates good technique Exercise tolerated well Personal goals reviewed No report of cardiac concerns or symptoms Strength training completed today  Goals Unmet:  Not Applicable  Comments: Pt able to follow exercise prescription today without complaint.  Will continue to monitor for progression.    Dr. Emily Filbert is Medical Director for [place] and [name].

## 2019-02-28 ENCOUNTER — Other Ambulatory Visit: Payer: Self-pay

## 2019-02-28 DIAGNOSIS — J449 Chronic obstructive pulmonary disease, unspecified: Secondary | ICD-10-CM | POA: Diagnosis not present

## 2019-02-28 NOTE — Progress Notes (Signed)
Daily Session Note  Patient Details  Name: Darius Norris MRN: [id number] Date of Birth: [birth year] Referring Provider:     Pulmonary Rehab from 12/23/2018 in [facility] Cardiac and Pulmonary Rehab  Referring Provider  Vernelle Emerald MD      Encounter Date: 02/28/2019  Check In: Session Check In - 02/28/19 1142      Check-In   Supervising physician immediately available to respond to emergencies  [name] immediately available ER MD    Physician(s)  Dr. Burlene Arnt and Corky Downs    Location  [facility]-Cardiac & Pulmonary Rehab    Staff Present  Justin Mend RCP,RRT,BSRT;Jessica Luan Pulling, MA, RCEP, CCRP, Exercise Physiologist;Krista Frederico Hamman, RN BSN    Medication changes reported      No    Fall or balance concerns reported     No    Warm-up and Cool-down  Performed as group-led Higher education careers adviser Performed  Yes    VAD Patient?  No    PAD/SET Patient?  No      Pain Assessment   Currently in Pain?  No/denies          Social History   Tobacco Use  Smoking Status Former Smoker  . Packs/day: 1.00  . Years: 30.00  . Pack years: 30.00  . Types: Cigarettes  . Last attempt to quit: 12/18/2001  . Years since quitting: 17.2  Smokeless Tobacco Never Used    Goals Met:  Independence with exercise equipment Exercise tolerated well No report of cardiac concerns or symptoms Strength training completed today  Goals Unmet:  Not Applicable  Comments: Pt able to follow exercise prescription today without complaint.  Will continue to monitor for progression.    Dr. Emily Filbert is Medical Director for [place] and [name].

## 2019-03-10 DIAGNOSIS — J449 Chronic obstructive pulmonary disease, unspecified: Secondary | ICD-10-CM

## 2019-03-24 ENCOUNTER — Encounter: Payer: Self-pay | Admitting: *Deleted

## 2019-03-24 DIAGNOSIS — J449 Chronic obstructive pulmonary disease, unspecified: Secondary | ICD-10-CM

## 2019-03-24 NOTE — Progress Notes (Signed)
Pulmonary Individual Treatment Plan  Patient Details  Name: Darius Norris MRN: [id number] Date of Birth: 12-15-1951 Referring Provider:     Pulmonary Rehab from 12/23/2018 in [facility] Cardiac and Pulmonary Rehab  Referring Provider  Vernelle Emerald MD      Initial Encounter Date:    Pulmonary Rehab from 12/23/2018 in [facility] Cardiac and Pulmonary Rehab  Date  12/23/18      Visit Diagnosis: Chronic obstructive pulmonary disease, unspecified COPD type ([place])  Patient's Home Medications on Admission:  Current Outpatient Medications:  .  acetaminophen (TYLENOL) 325 MG tablet, Take 650 mg by mouth every 6 (six) hours as needed. , Disp: , Rfl:  .  amitriptyline (ELAVIL) 10 MG tablet, Take 15 mg by mouth at bedtime. , Disp: , Rfl:  .  aspirin EC 81 MG EC tablet, Take 1 tablet (81 mg total) by mouth daily., Disp: 180 tablet, Rfl: 0 .  atorvastatin (LIPITOR) 80 MG tablet, Take 1 tablet (80 mg total) by mouth at bedtime., Disp: 30 tablet, Rfl: 0 .  budesonide-formoterol (SYMBICORT) 160-4.5 MCG/ACT inhaler, Inhale 2 puffs into the lungs 2 (two) times daily., Disp: , Rfl:  .  busPIRone (BUSPAR) 10 MG tablet, Take 10 mg by mouth 2 (two) times daily., Disp: , Rfl:  .  clobetasol cream (TEMOVATE) 0.62 %, Apply 1 application topically daily as needed. For itching on psoriasis areas, Disp: , Rfl:  .  dabigatran (PRADAXA) 150 MG CAPS capsule, Take 150 mg by mouth 2 (two) times daily., Disp: , Rfl:  .  diltiazem (CARDIZEM CD) 180 MG 24 hr capsule, Take 1 capsule (180 mg total) by mouth daily., Disp: 90 capsule, Rfl: 0 .  fluticasone (FLONASE) 50 MCG/ACT nasal spray, Place 2 sprays into both nostrils daily., Disp: , Rfl:  .  gabapentin (NEURONTIN) 100 MG capsule, Take 100 mg by mouth at bedtime., Disp: , Rfl:  .  Ipratropium-Albuterol (COMBIVENT RESPIMAT) 20-100 MCG/ACT AERS respimat, Inhale 1 puff into the lungs every 6 (six) hours as needed for wheezing or shortness of breath., Disp: , Rfl:  .  isosorbide  mononitrate (IMDUR) 60 MG 24 hr tablet, Take 60 mg by mouth daily., Disp: , Rfl:  .  lisinopril (PRINIVIL,ZESTRIL) 40 MG tablet, Take 20 mg by mouth daily. , Disp: , Rfl:  .  loratadine (CLARITIN) 10 MG tablet, Take 10 mg by mouth daily as needed for allergies. , Disp: , Rfl:  .  metoprolol tartrate (LOPRESSOR) 100 MG tablet, Take 1 tablet (100 mg total) by mouth 2 (two) times daily., Disp: 60 tablet, Rfl: 0 .  omeprazole (PRILOSEC) 20 MG capsule, Take 20 mg by mouth daily. , Disp: , Rfl:  .  potassium chloride (K-DUR,KLOR-CON) 10 MEQ tablet, Take 10 mEq by mouth daily., Disp: , Rfl:  .  senna (SENOKOT) 8.6 MG TABS tablet, Take 1 tablet by mouth daily as needed for mild constipation., Disp: , Rfl:  .  traZODone (DESYREL) 50 MG tablet, Take 1 tablet (50 mg total) by mouth at bedtime as needed for sleep., Disp: 30 tablet, Rfl: 0  Past Medical History: Past Medical History:  Diagnosis Date  . A-fib ([place])   . CAD (coronary artery disease)   . Colitis   . COPD (chronic obstructive pulmonary disease) ([place])   . Emphysema lung ([place])   . GERD (gastroesophageal reflux disease)   . Hypertension   . MI (myocardial infarction) ([place]) 1999    Tobacco Use: Social History   Tobacco Use  Smoking Status Former  Smoker  . Packs/day: 1.00  . Years: 30.00  . Pack years: 30.00  . Types: Cigarettes  . Last attempt to quit: 12/18/2001  . Years since quitting: 17.2  Smokeless Tobacco Never Used    Labs: Recent Chemical engineer    Labs for ITP Cardiac and Pulmonary Rehab Latest Ref Rng & Units 02/04/2013 09/04/2016 05/02/2018 01/05/2019   Cholestrol 0 - 200 mg/dL 185 116 - -   LDLCALC 0 - 99 mg/dL 114(H) 51 - -   HDL >40 mg/dL 38(L) 42 - -   Trlycerides <150 mg/dL 166 116 - -   Hemoglobin A1c 4.8 - 5.6 % - 6.4(H) - -   HCO3 20.0 - 28.0 mmol/L - - 36.3(H) 29.6(H)   O2SAT % - - - 81.7       Pulmonary Assessment Scores: Pulmonary Assessment Scores    Row Name 12/23/18 1425         ADL UCSD    ADL Phase  Entry     SOB Score total  90     Rest  3     Walk  4     Stairs  5     Bath  4     Dress  2     Shop  3       CAT Score   CAT Score  24       mMRC Score   mMRC Score  2        Pulmonary Function Assessment: Pulmonary Function Assessment - 12/23/18 1423      Post Bronchodilator Spirometry Results   Comments  results taken from 06/27/16      Breath   Bilateral Breath Sounds  Clear    Shortness of Breath  Yes;Fear of Shortness of Breath;Limiting activity       Exercise Target Goals: Exercise Program Goal: Individual exercise prescription set using results from initial 6 min walk test and THRR while considering  patient's activity barriers and safety.   Exercise Prescription Goal: Initial exercise prescription builds to 30-45 minutes a day of aerobic activity, 2-3 days per week.  Home exercise guidelines will be given to patient during program as part of exercise prescription that the participant will acknowledge.  Activity Barriers & Risk Stratification: Activity Barriers & Cardiac Risk Stratification - 12/23/18 1517      Activity Barriers & Cardiac Risk Stratification   Activity Barriers  Shortness of Breath;Deconditioning;Back Problems;Muscular Weakness;Balance Concerns;Assistive Device   chronic back pain      6 Minute Walk: 6 Minute Walk    Row Name 12/23/18 1510         6 Minute Walk   Phase  Initial     Distance  523 feet     Walk Time  3.92 minutes     # of Rest Breaks  2 63 sec; 62 sec     MPH  1.52     METS  1.57     RPE  11     Perceived Dyspnea   3     VO2 Peak  5.49     Symptoms  Yes (comment)     Comments  SOB; back pain 6/10     Resting HR  67 bpm     Resting BP  126/64     Resting Oxygen Saturation   94 %     Exercise Oxygen Saturation  during 6 min walk  82 %     Max Ex. HR  99  bpm     Max Ex. BP  164/62     2 Minute Post BP  140/66       Interval HR   1 Minute HR  94     2 Minute HR  92     3 Minute HR  91     4  Minute HR  99     5 Minute HR  93     6 Minute HR  98     2 Minute Post HR  81     Interval Heart Rate?  Yes       Interval Oxygen   Interval Oxygen?  Yes     Baseline Oxygen Saturation %  94 %     1 Minute Oxygen Saturation %  86 % seated rest 1:17-2:20     1 Minute Liters of Oxygen  2 L     2 Minute Oxygen Saturation %  82 %     2 Minute Liters of Oxygen  2 L     3 Minute Oxygen Saturation %  88 % 3:43-4:45     3 Minute Liters of Oxygen  2 L     4 Minute Oxygen Saturation %  82 %     4 Minute Liters of Oxygen  2 L     5 Minute Oxygen Saturation %  90 %     5 Minute Liters of Oxygen  2 L     6 Minute Oxygen Saturation %  83 %     6 Minute Liters of Oxygen  2 L     2 Minute Post Oxygen Saturation %  93 %     2 Minute Post Liters of Oxygen  2 L       Oxygen Initial Assessment: Oxygen Initial Assessment - 12/23/18 1413      Home Oxygen   Home Oxygen Device  Home Concentrator;E-Tanks    Sleep Oxygen Prescription  Continuous    Liters per minute  2    Home Exercise Oxygen Prescription  Continuous    Liters per minute  2    Home at Rest Exercise Oxygen Prescription  Continuous    Liters per minute  2    Compliance with Home Oxygen Use  Yes      Initial 6 min Walk   Oxygen Used  Continuous    Liters per minute  2      Program Oxygen Prescription   Program Oxygen Prescription  Continuous    Liters per minute  2      Intervention   Short Term Goals  To learn and exhibit compliance with exercise, home and travel O2 prescription;To learn and understand importance of monitoring SPO2 with pulse oximeter and demonstrate accurate use of the pulse oximeter.;To learn and understand importance of maintaining oxygen saturations>88%;To learn and demonstrate proper pursed lip breathing techniques or other breathing techniques.;To learn and demonstrate proper use of respiratory medications    Long  Term Goals  Exhibits compliance with exercise, home and travel O2 prescription;Verbalizes  importance of monitoring SPO2 with pulse oximeter and return demonstration;Maintenance of O2 saturations>88%;Exhibits proper breathing techniques, such as pursed lip breathing or other method taught during program session;Compliance with respiratory medication;Demonstrates proper use of MDI's       Oxygen Re-Evaluation: Oxygen Re-Evaluation    Row Name 12/30/18 1422 02/05/19 1423 03/12/19 1509         Program Oxygen Prescription   Program Oxygen Prescription  Continuous  Continuous  Continuous     Liters per minute  '2  3  3       '$ Home Oxygen   Home Oxygen Device  Home Concentrator;E-Tanks  Home Concentrator;E-Tanks  Home Concentrator;E-Tanks     Sleep Oxygen Prescription  Continuous  Continuous  Continuous     Liters per minute  '2  2  2     '$ Home Exercise Oxygen Prescription  Continuous  -  Continuous     Liters per minute  '2  2  2     '$ Home at Rest Exercise Oxygen Prescription  Continuous  Continuous  Continuous     Liters per minute  '2  2  2     '$ Compliance with Home Oxygen Use  Yes  Yes  -       Goals/Expected Outcomes   Short Term Goals  To learn and exhibit compliance with exercise, home and travel O2 prescription;To learn and understand importance of monitoring SPO2 with pulse oximeter and demonstrate accurate use of the pulse oximeter.;To learn and understand importance of maintaining oxygen saturations>88%;To learn and demonstrate proper pursed lip breathing techniques or other breathing techniques.;To learn and demonstrate proper use of respiratory medications  To learn and exhibit compliance with exercise, home and travel O2 prescription;To learn and understand importance of monitoring SPO2 with pulse oximeter and demonstrate accurate use of the pulse oximeter.;To learn and understand importance of maintaining oxygen saturations>88%;To learn and demonstrate proper pursed lip breathing techniques or other breathing techniques.;To learn and demonstrate proper use of respiratory  medications  To learn and exhibit compliance with exercise, home and travel O2 prescription;To learn and understand importance of monitoring SPO2 with pulse oximeter and demonstrate accurate use of the pulse oximeter.;To learn and understand importance of maintaining oxygen saturations>88%;To learn and demonstrate proper pursed lip breathing techniques or other breathing techniques.;To learn and demonstrate proper use of respiratory medications     Long  Term Goals  Exhibits compliance with exercise, home and travel O2 prescription;Verbalizes importance of monitoring SPO2 with pulse oximeter and return demonstration;Maintenance of O2 saturations>88%;Exhibits proper breathing techniques, such as pursed lip breathing or other method taught during program session;Compliance with respiratory medication;Demonstrates proper use of MDI's  Exhibits compliance with exercise, home and travel O2 prescription;Verbalizes importance of monitoring SPO2 with pulse oximeter and return demonstration;Maintenance of O2 saturations>88%;Exhibits proper breathing techniques, such as pursed lip breathing or other method taught during program session;Compliance with respiratory medication;Demonstrates proper use of MDI's  Exhibits compliance with exercise, home and travel O2 prescription;Verbalizes importance of monitoring SPO2 with pulse oximeter and return demonstration;Maintenance of O2 saturations>88%;Exhibits proper breathing techniques, such as pursed lip breathing or other method taught during program session;Compliance with respiratory medication;Demonstrates proper use of MDI's     Comments  Reviewed PLB technique with pt.  Talked about how it work and it's important to maintaining his exercise saturations.    Spoke to patient about COPD Action Plan. Reviewed and talked with the patient about the different levels of COPD severity that they should review on a daily basis and the steps they can take to manage their COPD. Went  over pertinent actions for the patient can take when they feel like they are having a COPD exacerbation and the necessary treatment if needed. If patient has any changes with their breathing or feel like they are in a different zone of severity to inform staff for re-evaluation. Patient verbalizes understanding. Copy given to patient. Patient feels like he is in the yellow  zone when he does not get enough sleep. He has not had an exacerbation in over a year.  Darius Norris is doing well at home.  He is staying compliant with his meds and oxygen therapy.  He is using his PLB and finding it to be helpful.    He checks in with COPD action place for breathing and weight.      Goals/Expected Outcomes  Short: Become more profiecient at using PLB.   Long: Become independent at using PLB.  Short: Follow COPD action plan. Long: Report any changes and severity of COPD.  Short: Continue to work on PLB and using action plan.  Long: Continued compliance.         Oxygen Discharge (Final Oxygen Re-Evaluation): Oxygen Re-Evaluation - 03/12/19 1509      Program Oxygen Prescription   Program Oxygen Prescription  Continuous    Liters per minute  3      Home Oxygen   Home Oxygen Device  Home Concentrator;E-Tanks    Sleep Oxygen Prescription  Continuous    Liters per minute  2    Home Exercise Oxygen Prescription  Continuous    Liters per minute  2    Home at Rest Exercise Oxygen Prescription  Continuous    Liters per minute  2      Goals/Expected Outcomes   Short Term Goals  To learn and exhibit compliance with exercise, home and travel O2 prescription;To learn and understand importance of monitoring SPO2 with pulse oximeter and demonstrate accurate use of the pulse oximeter.;To learn and understand importance of maintaining oxygen saturations>88%;To learn and demonstrate proper pursed lip breathing techniques or other breathing techniques.;To learn and demonstrate proper use of respiratory medications    Long  Term  Goals  Exhibits compliance with exercise, home and travel O2 prescription;Verbalizes importance of monitoring SPO2 with pulse oximeter and return demonstration;Maintenance of O2 saturations>88%;Exhibits proper breathing techniques, such as pursed lip breathing or other method taught during program session;Compliance with respiratory medication;Demonstrates proper use of MDI's    Comments  Darius Norris is doing well at home.  He is staying compliant with his meds and oxygen therapy.  He is using his PLB and finding it to be helpful.    He checks in with COPD action place for breathing and weight.     Goals/Expected Outcomes  Short: Continue to work on PLB and using action plan.  Long: Continued compliance.        Initial Exercise Prescription: Initial Exercise Prescription - 12/23/18 1500      Date of Initial Exercise RX and Referring Provider   Date  12/23/18    Referring Provider  Vernelle Emerald MD      Oxygen   Oxygen  Continuous    Liters  3      Treadmill   MPH  1    Grade  0.5    Minutes  15    METs  1.83      NuStep   Level  1    SPM  80    Minutes  15    METs  1.8      REL-XR   Level  1    Speed  50    Minutes  15    METs  1.8      Prescription Details   Frequency (times per week)  3    Duration  Progress to 45 minutes of aerobic exercise without signs/symptoms of physical distress      Intensity  THRR 40-80% of Max Heartrate  101-136    Ratings of Perceived Exertion  11-13    Perceived Dyspnea  0-4      Progression   Progression  Continue to progress workloads to maintain intensity without signs/symptoms of physical distress.      Resistance Training   Training Prescription  Yes    Weight  3 lbs    Reps  10-15       Perform Capillary Blood Glucose checks as needed.  Exercise Prescription Changes: Exercise Prescription Changes    Row Name 12/23/18 1500 12/30/18 1600 01/01/19 1200 01/14/19 1300 01/28/19 1200     Response to Exercise   Blood Pressure  (Admit)  126/64  130/80  -  134/68  140/82   Blood Pressure (Exercise)  164/62  138/64  -  -  -   Blood Pressure (Exit)  140/66  132/82  -  108/60  118/56   Heart Rate (Admit)  67 bpm  76 bpm  -  63 bpm  79 bpm   Heart Rate (Exercise)  99 bpm  73 bpm  -  78 bpm  93 bpm   Heart Rate (Exit)  81 bpm  71 bpm  -  63 bpm  78 bpm   Oxygen Saturation (Admit)  94 %  91 %  -  88 %  94 %   Oxygen Saturation (Exercise)  82 %  96 %  -  95 %  93 %   Oxygen Saturation (Exit)  93 %  94 %  -  97 %  98 %   Rating of Perceived Exertion (Exercise)  17  13  -  13  13   Perceived Dyspnea (Exercise)  3  2  -  2  2   Symptoms  back pain 6/10; SOB  none  -  SOB  SOB   Comments  walk test results  first full day of exercise  -  -  -   Duration  -  Progress to 45 minutes of aerobic exercise without signs/symptoms of physical distress  -  Continue with 45 min of aerobic exercise without signs/symptoms of physical distress.  Continue with 45 min of aerobic exercise without signs/symptoms of physical distress.   Intensity  -  THRR unchanged  -  THRR unchanged  THRR unchanged     Progression   Progression  -  Continue to progress workloads to maintain intensity without signs/symptoms of physical distress.  -  Continue to progress workloads to maintain intensity without signs/symptoms of physical distress.  Continue to progress workloads to maintain intensity without signs/symptoms of physical distress.   Average METs  -  1.81  -  2.28  2.24     Resistance Training   Training Prescription  -  Yes  -  Yes  Yes   Weight  -  3 lbs  -  3 lbs  3 lbs   Reps  -  10-15  -  10-15  10-15     Interval Training   Interval Training  -  No  -  No  -     Oxygen   Oxygen  -  Continuous  -  Continuous  -   Liters  -  3  -  3  -     Treadmill   MPH  -  1  -  1  1   Grade  -  0.5  -  0.5  0.5  Minutes  -  15  -  15  15   METs  -  1.83  -  1.83  1.83     NuStep   Level  -  1  -  2  2   Minutes  -  15  -  15  15   METs  -  2.3   -  2.5  2.5     REL-XR   Level  -  1  -  1  2   Minutes  -  15  -  15  15   METs  -  1.3  -  2.5  2.4     Home Exercise Plan   Plans to continue exercise at  -  -  Home (comment) walking, stationary bike  Home (comment) walking, stationary bike  Home (comment) walking, stationary bike   Frequency  -  -  Add 1 additional day to program exercise sessions.  Add 1 additional day to program exercise sessions.  Add 1 additional day to program exercise sessions.   Initial Home Exercises Provided  -  -  01/01/19  01/01/19  01/01/19   Row Name 02/11/19 1500 02/24/19 1600 03/12/19 1500         Response to Exercise   Blood Pressure (Admit)  138/82  148/70  126/72     Blood Pressure (Exit)  128/60  122/60  124/58     Heart Rate (Admit)  72 bpm  62 bpm  62 bpm     Heart Rate (Exercise)  81 bpm  77 bpm  74 bpm     Heart Rate (Exit)  72 bpm  64 bpm  71 bpm     Oxygen Saturation (Admit)  96 %  96 %  96 %     Oxygen Saturation (Exercise)  94 %  95 %  94 %     Oxygen Saturation (Exit)  97 %  97 %  96 %     Rating of Perceived Exertion (Exercise)  '13  12  13     '$ Perceived Dyspnea (Exercise)  '2  2  2     '$ Symptoms  SOB  SOB  SOB     Duration  Continue with 45 min of aerobic exercise without signs/symptoms of physical distress.  Continue with 45 min of aerobic exercise without signs/symptoms of physical distress.  Continue with 45 min of aerobic exercise without signs/symptoms of physical distress.     Intensity  THRR unchanged  THRR unchanged  THRR unchanged       Progression   Progression  Continue to progress workloads to maintain intensity without signs/symptoms of physical distress.  Continue to progress workloads to maintain intensity without signs/symptoms of physical distress.  Continue to progress workloads to maintain intensity without signs/symptoms of physical distress.     Average METs  2.34  2.25  2.34       Resistance Training   Training Prescription  Yes  Yes  Yes     Weight  3 lbs  3  lbs  3 lbs     Reps  10-15  10-15  10-15       Interval Training   Interval Training  No  No  No       Oxygen   Oxygen  Continuous  Continuous  Continuous     Liters  '3  3  3       '$ Treadmill   MPH  1  -  1     Grade  0.5  -  0.5     Minutes  15  -  15     METs  1.83  -  1.83       NuStep   Level  '3  3  3     '$ Minutes  '15  15  15     '$ METs  2.4  2.5  2.6       REL-XR   Level  '4  4  4     '$ Minutes  '15  15  15     '$ METs  2.8  2  2.6       Home Exercise Plan   Plans to continue exercise at  Home (comment) walking, stationary bike  Home (comment) walking, stationary bike  Home (comment) walking, stationary bike     Frequency  Add 2 additional days to program exercise sessions.  Add 2 additional days to program exercise sessions.  Add 2 additional days to program exercise sessions.     Initial Home Exercises Provided  01/01/19  01/01/19  01/01/19        Exercise Comments: Exercise Comments    Row Name 12/30/18 1144           Exercise Comments   First full day of exercise!  Patient was oriented to gym and equipment including functions, settings, policies, and procedures.  Patient's individual exercise prescription and treatment plan were reviewed.  All starting workloads were established based on the results of the 6 minute walk test done at initial orientation visit.  The plan for exercise progression was also introduced and progression will be customized based on patient's performance and goals.          Exercise Goals and Review: Exercise Goals    Row Name 12/23/18 1554             Exercise Goals   Increase Physical Activity  Yes       Intervention  Provide advice, education, support and counseling about physical activity/exercise needs.;Develop an individualized exercise prescription for aerobic and resistive training based on initial evaluation findings, risk stratification, comorbidities and participant's personal goals.       Expected Outcomes  Short Term: Attend  rehab on a regular basis to increase amount of physical activity.;Long Term: Add in home exercise to make exercise part of routine and to increase amount of physical activity.;Long Term: Exercising regularly at least 3-5 days a week.       Increase Strength and Stamina  Yes       Intervention  Provide advice, education, support and counseling about physical activity/exercise needs.;Develop an individualized exercise prescription for aerobic and resistive training based on initial evaluation findings, risk stratification, comorbidities and participant's personal goals.       Expected Outcomes  Short Term: Increase workloads from initial exercise prescription for resistance, speed, and METs.;Short Term: Perform resistance training exercises routinely during rehab and add in resistance training at home;Long Term: Improve cardiorespiratory fitness, muscular endurance and strength as measured by increased METs and functional capacity (6MWT)       Able to understand and use rate of perceived exertion (RPE) scale  Yes       Intervention  Provide education and explanation on how to use RPE scale       Expected Outcomes  Short Term: Able to use RPE daily in rehab to express subjective intensity level;Long Term:  Able to use RPE to guide intensity level  when exercising independently       Able to understand and use Dyspnea scale  Yes       Intervention  Provide education and explanation on how to use Dyspnea scale       Expected Outcomes  Short Term: Able to use Dyspnea scale daily in rehab to express subjective sense of shortness of breath during exertion;Long Term: Able to use Dyspnea scale to guide intensity level when exercising independently       Knowledge and understanding of Target Heart Rate Range (THRR)  Yes       Intervention  Provide education and explanation of THRR including how the numbers were predicted and where they are located for reference       Expected Outcomes  Short Term: Able to state/look  up THRR;Short Term: Able to use daily as guideline for intensity in rehab;Long Term: Able to use THRR to govern intensity when exercising independently       Able to check pulse independently  Yes       Intervention  Provide education and demonstration on how to check pulse in carotid and radial arteries.;Review the importance of being able to check your own pulse for safety during independent exercise       Expected Outcomes  Short Term: Able to explain why pulse checking is important during independent exercise;Long Term: Able to check pulse independently and accurately       Understanding of Exercise Prescription  Yes       Intervention  Provide education, explanation, and written materials on patient's individual exercise prescription       Expected Outcomes  Short Term: Able to explain program exercise prescription;Long Term: Able to explain home exercise prescription to exercise independently          Exercise Goals Re-Evaluation : Exercise Goals Re-Evaluation    [place] Name 12/30/18 1144 01/01/19 1215 01/14/19 1353 01/28/19 1246 02/11/19 1501     Exercise Goal Re-Evaluation   Exercise Goals Review  Increase Physical Activity;Able to understand and use rate of perceived exertion (RPE) scale;Knowledge and understanding of Target Heart Rate Range (THRR);Understanding of Exercise Prescription;Increase Strength and Stamina;Able to understand and use Dyspnea scale  Increase Physical Activity;Able to understand and use rate of perceived exertion (RPE) scale;Knowledge and understanding of Target Heart Rate Range (THRR);Understanding of Exercise Prescription;Increase Strength and Stamina;Able to understand and use Dyspnea scale;Able to check pulse independently  Increase Physical Activity;Increase Strength and Stamina;Understanding of Exercise Prescription  Increase Physical Activity;Increase Strength and Stamina;Understanding of Exercise Prescription  Increase Physical Activity;Increase Strength and  Stamina;Understanding of Exercise Prescription   Comments  Reviewed RPE scale, THR and program prescription with pt today.  Pt voiced understanding and was given a copy of goals to take home.   Reviewed home exercise with pt today.  Pt plans to walking and ride his stationary bike for exercise.  Reviewed THR, pulse, RPE, sign and symptoms, and when to call 911 or MD.  Also discussed weather considerations and indoor options.  Pt voiced understanding.  Darius Norris has been doing well in rehab.  He is up to level 2 on the NuStep.  We will start to increase his other workloads as his sats are improving.  We will continue to monitor his progress.   Darius Norris continues to do well in rehab.  He is still a 13 on everything.  We will try to increase some workloads despite the RPEs.  We will continue to monitor her progress.   Darius Norris has  been doing well in rehab.  He is starting to come down to a 12 on RPE.  We will increase some workloads.  We will continue to monitor his progression.    Expected Outcomes  Short: Use RPE daily to regulate intensity. Long: Follow program prescription in THR.  Short: Start to add in exercise at home for at least one extra day a week.  Long: Continue to increase strength and stamina.   Short: Increase workloads.  Long: Continue to increase strength and stamina.   Short: Increase workloads. Long: Continue to rebuild stamina.   Short: Increase workloads.  Long: Continue to increase acitivity levels.    [place] Name 02/24/19 1618 03/12/19 1506           Exercise Goal Re-Evaluation   Exercise Goals Review  Increase Physical Activity;Increase Strength and Stamina;Understanding of Exercise Prescription  Increase Physical Activity;Increase Strength and Stamina;Understanding of Exercise Prescription      Comments  Darius Norris continues to do well in rehab. We have tried to encourage him to walk down to class, but he continues to use his scooter to get around.  He has been out since 3/4.  We will continue to  encourage him to increase workloads and monitor his progression.  Darius Norris has been doing well. He is doing his best to stay active at home.  He has been using his bike and walking some.  Overall, he feels better and continues to make improvements.  We will continue to montior his progress at home.       Expected Outcomes  Short: Increase workloads and walk more.  Long: Continue to move more  Short: Increase time of exercise at home to 30 min.  Long: Continue to increase activity levels.          Discharge Exercise Prescription (Final Exercise Prescription Changes): Exercise Prescription Changes - 03/12/19 1500      Response to Exercise   Blood Pressure (Admit)  126/72    Blood Pressure (Exit)  124/58    Heart Rate (Admit)  62 bpm    Heart Rate (Exercise)  74 bpm    Heart Rate (Exit)  71 bpm    Oxygen Saturation (Admit)  96 %    Oxygen Saturation (Exercise)  94 %    Oxygen Saturation (Exit)  96 %    Rating of Perceived Exertion (Exercise)  13    Perceived Dyspnea (Exercise)  2    Symptoms  SOB    Duration  Continue with 45 min of aerobic exercise without signs/symptoms of physical distress.    Intensity  THRR unchanged      Progression   Progression  Continue to progress workloads to maintain intensity without signs/symptoms of physical distress.    Average METs  2.34      Resistance Training   Training Prescription  Yes    Weight  3 lbs    Reps  10-15      Interval Training   Interval Training  No      Oxygen   Oxygen  Continuous    Liters  3      Treadmill   MPH  1    Grade  0.5    Minutes  15    METs  1.83      NuStep   Level  3    Minutes  15    METs  2.6      REL-XR   Level  4    Minutes  15  METs  2.6      Home Exercise Plan   Plans to continue exercise at  Home (comment)   walking, stationary bike   Frequency  Add 2 additional days to program exercise sessions.    Initial Home Exercises Provided  01/01/19       Nutrition:  Target Goals:  Understanding of nutrition guidelines, daily intake of sodium '1500mg'$ , cholesterol '200mg'$ , calories 30% from fat and 7% or less from saturated fats, daily to have 5 or more servings of fruits and vegetables.  Biometrics: Pre Biometrics - 12/23/18 1554      Pre Biometrics   Height  '5\' 6"'$  (1.676 m)    Weight  209 lb 6.4 oz (95 kg)    Waist Circumference  43 inches    Hip Circumference  47 inches    Waist to Hip Ratio  0.91 %    BMI (Calculated)  33.81    Single Leg Stand  1.41 seconds        Nutrition Therapy Plan and Nutrition Goals: Nutrition Therapy & Goals - 12/30/18 1232      Nutrition Therapy   Diet  TLC    Drug/Food Interactions  Statins/Certain Fruits    Protein (specify units)  8-9oz    Fiber  30 grams    Whole Grain Foods  3 servings   not currently choosing whole grains   Saturated Fats  15 max. grams    Fruits and Vegetables  5 servings/day   8 ideal. eats more vegetables in the summer, trying to eat more fruits   Sodium  2000 grams      Personal Nutrition Goals   Nutrition Goal  Start to decrease soda intake. Replace one can with a glass of water each day as an initial goal    Personal Goal #2  Eat fruits and vegetables more regularly, paricularly vegetables. Great job for starting to eat grapes more often as part of your breakfast    Personal Goal #3  Start to become mindful of the amount of salt you are adding to meals    Comments  Pt reports recent wt gain of approx 8-10# d/t increased appetite r/t Prednisone. His wife comes home from work at [employer] and so he eats 'lightly' until then. Breakfast: homemade biscuit with sausage or bacon, egg, grapes, cinnamon coffee cake. Snacks: pretzels, frozen fruit bar, [place] Ice, biscuit. Dinner: soup, sandwich, 'breakfast for dinner.' They eat 1-2 "substantial meals" / week for dinner IE meat + starch + vegetable. He likes beans, green beans, potatoes, beef, chicken, seafood 1x/month, BBQ meal out 1x/month. Currently adds salt to  meals as he feels that adding salt is the only way he is able to taste food      Intervention Plan   Intervention  Prescribe, educate and counsel regarding individualized specific dietary modifications aiming towards targeted core components such as weight, hypertension, lipid management, diabetes, heart failure and other comorbidities.;Nutrition handout(s) given to patient.   Dietary tips for better breathing   Expected Outcomes  Short Term Goal: Understand basic principles of dietary content, such as calories, fat, sodium, cholesterol and nutrients.;Short Term Goal: A plan has been developed with personal nutrition goals set during dietitian appointment.;Long Term Goal: Adherence to prescribed nutrition plan.       Nutrition Assessments:   Nutrition Goals Re-Evaluation: Nutrition Goals Re-Evaluation    [place] Name 12/30/18 1246 02/19/19 1226           Goals   Current Weight  -  214 lb (97.1 kg)      Nutrition Goal  Start to decrease soda intake. Replace one can with a glass of water each day as an initial goal  Decrease soda intake, Lose weight      Comment  Currently drinking mostly Pepsi as beverage choices. Occasionally will drink water but is trying to be better about drinking more  Patient states that he is eating too much and eats later than he would like. Darius Norris is drinking about 16 ounces of regular pepsi a day. He is going to try to cut back to 6oz at breakfast and 6oz at dinner. Informed him that diet would be a better option for him to since it has no sugar. He states he and his wife eat dinner in front of the TV. Stated to patient that eating in front of the TV will most likely incline him to eat more.       Expected Outcome  Short term: replace one can of soda per day with water. Long term: eliminate soda from diet and drink non-sugar sweetened beverages instead  Short: decrease meal and soda intake and Lose 5 pounds. Long: have one soda a day and maintain weight loss.         Personal Goal #2 Re-Evaluation   Personal Goal #2  Eat fruits and vegetables more regularly, particularly vegetables. Great job for starting to eat grapes more often as part of your breakfast  -        Personal Goal #3 Re-Evaluation   Personal Goal #3  Start to become mindful of the amount of salt you are adding to meals  -         Nutrition Goals Discharge (Final Nutrition Goals Re-Evaluation): Nutrition Goals Re-Evaluation - 02/19/19 1226      Goals   Current Weight  214 lb (97.1 kg)    Nutrition Goal  Decrease soda intake, Lose weight    Comment  Patient states that he is eating too much and eats later than he would like. Darius Norris is drinking about 16 ounces of regular pepsi a day. He is going to try to cut back to 6oz at breakfast and 6oz at dinner. Informed him that diet would be a better option for him to since it has no sugar. He states he and his wife eat dinner in front of the TV. Stated to patient that eating in front of the TV will most likely incline him to eat more.     Expected Outcome  Short: decrease meal and soda intake and Lose 5 pounds. Long: have one soda a day and maintain weight loss.       Psychosocial: Target Goals: Acknowledge presence or absence of significant depression and/or stress, maximize coping skills, provide positive support system. Participant is able to verbalize types and ability to use techniques and skills needed for reducing stress and depression.   Initial Review & Psychosocial Screening: Initial Psych Review & Screening - 12/23/18 1415      Initial Review   Current issues with  Current Anxiety/Panic;Current Stress Concerns    Source of Stress Concerns  Chronic Illness    Comments  Sometimes has panic issues with shortness of breath.      Family Dynamics   Good Support System?  Yes    Comments  He can look to his wife and his two sisters for support.      Barriers   Psychosocial barriers to participate in program  The patient should  benefit  from training in stress management and relaxation.      Screening Interventions   Interventions  Encouraged to exercise;Program counselor consult;To provide support and resources with identified psychosocial needs;Provide feedback about the scores to participant    Expected Outcomes  Short Term goal: Utilizing psychosocial counselor, staff and physician to assist with identification of specific Stressors or current issues interfering with healing process. Setting desired goal for each stressor or current issue identified.;Long Term Goal: Stressors or current issues are controlled or eliminated.;Short Term goal: Identification and review with participant of any Quality of Life or Depression concerns found by scoring the questionnaire.;Long Term goal: The participant improves quality of Life and PHQ9 Scores as seen by post scores and/or verbalization of changes       Quality of Life Scores:  Scores of 19 and below usually indicate a poorer quality of life in these areas.  A difference of  2-3 points is a clinically meaningful difference.  A difference of 2-3 points in the total score of the Quality of Life Index has been associated with significant improvement in overall quality of life, self-image, physical symptoms, and general health in studies assessing change in quality of life.  PHQ-9: Recent Review Flowsheet Data    Depression screen [facility] 2/9 02/26/2019 01/29/2019 12/23/2018 11/20/2016 06/27/2016   Decreased Interest 1 0 2 1 0   Down, Depressed, Hopeless 0 0 0 1 0   PHQ - 2 Score 1 0 2 2 0   Altered sleeping '2 2 2 1 1   '$ Tired, decreased energy '2 1 3 2 2   '$ Change in appetite '1 2 1 1 1   '$ Feeling bad or failure about yourself  0 0 0 0 0   Trouble concentrating 0 0 0 0 0   Moving slowly or fidgety/restless 0 0 1 0 0   Suicidal thoughts 0 0 0 0 0   PHQ-9 Score '6 5 9 6 4   '$ Difficult doing work/chores Somewhat difficult Somewhat difficult Very difficult Very difficult Somewhat difficult      Interpretation of Total Score  Total Score Depression Severity:  1-4 = Minimal depression, 5-9 = Mild depression, 10-14 = Moderate depression, 15-19 = Moderately severe depression, 20-27 = Severe depression   Psychosocial Evaluation and Intervention: Psychosocial Evaluation - 01/13/19 1236      Psychosocial Evaluation & Interventions   Interventions  Stress management education;Encouraged to exercise with the program and follow exercise prescription    Comments  Mr. Blume Darius Norris) has returned to this program after several years being out.  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  He has a strong support system with a spouse and daughter and (3) sisters who live locally.  He reports not sleeping well with 3-4 hours at night and 3 hours in the afternoons.  Darius Norris states he had a sleep study done several years ago but no treatment.  His appetite is good.  He denies a history of depression or anxiety but his recent admissions mention anxiety with medication.  He reports typically being in a positive mood most of the time.  Darius Norris has multiple stressors with his health; that of his spouse; and finances.  He has goals to breathe better; exercise and get stronger and socialize in the process.  Staff will follow.     Expected Outcomes  Short:  Darius Norris will exercise for his health and mental health - stress reduction and hopefully sleep quality.  Long: Darius Norris will learn positive  coping strategies including exercise and practice them consistently.     Continue Psychosocial Services   Follow up required by staff       Psychosocial Re-Evaluation: Psychosocial Re-Evaluation    [place] Name 01/29/19 1432 02/26/19 1418 03/12/19 1512         Psychosocial Re-Evaluation   Current issues with  Current Stress Concerns;Current Anxiety/Panic  Current Stress Concerns;Current Anxiety/Panic  Current Stress Concerns;Current Anxiety/Panic     Comments  Reviewed patient health questionnaire (PHQ-9) with patient for follow up.  Previously, patients score indicated signs/symptoms of depression.  Reviewed to see if patient is improving symptom wise while in program.  Score improved and patient states that it is because he has been able to exercise, get out more and be social.  Reviewed patient health questionnaire (PHQ-9) with patient for follow up. Previously, patients score indicated signs/symptoms of depression.  Reviewed to see if patient is improving symptom wise while in program.  Score declined and patient states that it is because he has not been sleeping well and he tends to over eat. His lack of sleep is making him tired and he lacks energy.  Darius Norris is doing well at home.  He is trying to stay active and not dwell on things.  His sleep issues have continued but he is dealing with it the best he can.  He is going to try to stay postive.   [facility] all [facility] upcoming [facility] to telephone calls for a virtual visit.      Expected Outcomes  Short: Continue to attend [name] regularly for regular exercise and social engagement. Long: Continue to improve symptoms and manage a positive mental state.  Short: Continue to work toward an improvement in [place] scores by attending [name] regularly. Long: Continue to improve stress and depression coping skills by talking with staff and attending [name] regularly and work toward a positive mental state.   Short: Continue to stay active.  Long: Continue to stay postive.      Interventions  Encouraged to attend [facility] for the exercise  Encouraged to attend [facility] Rehabilitation for the exercise  -     Continue Psychosocial Services   Follow up required by staff  Follow up required by staff  -        Psychosocial Discharge (Final Psychosocial Re-Evaluation): Psychosocial Re-Evaluation - 03/12/19 1512      Psychosocial Re-Evaluation   Current issues with  Current Stress Concerns;Current Anxiety/Panic    Comments  Darius Norris is doing well at home.  He is trying to stay  active and not dwell on things.  His sleep issues have continued but he is dealing with it the best he can.  He is going to try to stay postive.   [facility] all [facility] upcoming [facility] to telephone calls for a virtual visit.     Expected Outcomes  Short: Continue to stay active.  Long: Continue to stay postive.        Education: Education Goals: Education classes will be provided on a weekly basis, covering required topics. Participant will state understanding/return demonstration of topics presented.  Learning Barriers/Preferences: Learning Barriers/Preferences - 12/23/18 1417      Learning Barriers/Preferences   Learning Barriers  None    Learning Preferences  Group Instruction;Individual Instruction;Pictoral;Skilled Demonstration;Verbal Instruction;Video;Written Material       Education Topics:  Initial Evaluation Education: - Verbal, written and demonstration of respiratory meds, oximetry and breathing techniques. Instruction on use of nebulizers and MDIs and  importance of monitoring MDI activations.   Pulmonary Rehab from 02/26/2019 in [facility] Cardiac and Pulmonary Rehab  Date  12/23/18  Educator  [facility]  Instruction Review Code  1- Verbalizes Understanding      General Nutrition Guidelines/Fats and Fiber: -Group instruction provided by verbal, written material, models and posters to present the general guidelines for heart healthy nutrition. Gives an explanation and review of dietary fats and fiber.   Pulmonary Rehab from 02/26/2019 in [facility] Cardiac and Pulmonary Rehab  Date  01/29/19  Educator  [facility]  Instruction Review Code  1- Verbalizes Understanding      Controlling Sodium/Reading Food Labels: -Group verbal and written material supporting the discussion of sodium use in heart healthy nutrition. Review and explanation with models, verbal and written materials for utilization of the food label.   Pulmonary Rehab from 02/26/2019 in [facility] Cardiac and Pulmonary Rehab  Date   02/05/19  Educator  [facility]  Instruction Review Code  1- Verbalizes Understanding      Exercise Physiology & General Exercise Guidelines: - Group verbal and written instruction with models to review the exercise physiology of the cardiovascular system and associated critical values. Provides general exercise guidelines with specific guidelines to those with heart or lung disease.    Pulmonary Rehab from 02/26/2019 in [facility] Cardiac and Pulmonary Rehab  Date  01/08/19  Educator  [facility]  Instruction Review Code  1- Verbalizes Understanding      Aerobic Exercise & Resistance Training: - Gives group verbal and written instruction on the various components of exercise. Focuses on aerobic and resistive training programs and the benefits of this training and how to safely progress through these programs.   Pulmonary Rehab from 02/26/2019 in [facility] Cardiac and Pulmonary Rehab  Date  01/10/19  Educator  [facility]  Instruction Review Code  1- Verbalizes Understanding      Flexibility, Balance, Mind/Body Relaxation: Provides group verbal/written instruction on the benefits of flexibility and balance training, including mind/body exercise modes such as yoga, pilates and tai chi.  Demonstration and skill practice provided.   Pulmonary Rehab from 02/26/2019 in [facility] Cardiac and Pulmonary Rehab  Date  01/15/19  Educator  [facility]  Instruction Review Code  1- Verbalizes Understanding      Stress and Anxiety: - Provides group verbal and written instruction about the health risks of elevated stress and causes of high stress.  Discuss the correlation between heart/lung disease and anxiety and treatment options. Review healthy ways to manage with stress and anxiety.   Depression: - Provides group verbal and written instruction on the correlation between heart/lung disease and depressed mood, treatment options, and the stigmas associated with seeking treatment.   Pulmonary Rehab from 02/26/2019 in [facility] Cardiac and Pulmonary  Rehab  Date  02/19/19  Educator  [place]  Instruction Review Code  1- Verbalizes Understanding      Exercise & Equipment Safety: - Individual verbal instruction and demonstration of equipment use and safety with use of the equipment.   Pulmonary Rehab from 02/26/2019 in [facility] Cardiac and Pulmonary Rehab  Date  12/23/18  Educator  [facility]  Instruction Review Code  1- Verbalizes Understanding      Infection Prevention: - Provides verbal and written material to individual with discussion of infection control including proper hand washing and proper equipment cleaning during exercise session.   Pulmonary Rehab from 02/26/2019 in [facility] Cardiac and Pulmonary Rehab  Date  12/23/18  Educator  [facility]  Instruction Review Code  1- Verbalizes Understanding  Falls Prevention: - Provides verbal and written material to individual with discussion of falls prevention and safety.   Pulmonary Rehab from 02/26/2019 in [facility] Cardiac and Pulmonary Rehab  Date  12/23/18  Educator  [facility]  Instruction Review Code  1- Verbalizes Understanding      Diabetes: - Individual verbal and written instruction to review signs/symptoms of diabetes, desired ranges of glucose level fasting, after meals and with exercise. Advice that pre and post exercise glucose checks will be done for 3 sessions at entry of program.   Chronic Lung Diseases: - Group verbal and written instruction to review updates, respiratory medications, advancements in procedures and treatments. Discuss use of supplemental oxygen including available portable oxygen systems, continuous and intermittent flow rates, concentrators, personal use and safety guidelines. Review proper use of inhaler and spacers. Provide informative websites for self-education.    Pulmonary Rehab from 02/26/2019 in [facility] Cardiac and Pulmonary Rehab  Date  01/24/19  Educator  [facility]  Instruction Review Code  1- Verbalizes Understanding      Energy Conservation: - Provide group verbal and  written instruction for methods to conserve energy, plan and organize activities. Instruct on pacing techniques, use of adaptive equipment and posture/positioning to relieve shortness of breath.   Pulmonary Rehab from 05/24/2015 in [place]  Date  05/12/15  Educator  SW  Instruction Review Code (retired)  2- meets goals/outcomes      Triggers and Exacerbations: - Group verbal and written instruction to review types of environmental triggers and ways to prevent exacerbations. Discuss weather changes, air quality and the benefits of nasal washing. Review warning signs and symptoms to help prevent infections. Discuss techniques for effective airway clearance, coughing, and vibrations.   AED/CPR: - Group verbal and written instruction with the use of models to demonstrate the basic use of the AED with the basic ABC's of resuscitation.   Pulmonary Rehab from 02/26/2019 in [facility] Cardiac and Pulmonary Rehab  Date  01/01/19  Educator  [facility]  Instruction Review Code  1- Actuary and Physiology of the Lungs: - Group verbal and written instruction with the use of models to provide basic lung anatomy and physiology related to function, structure and complications of lung disease.   Pulmonary Rehab from 02/26/2019 in [facility] Cardiac and Pulmonary Rehab  Date  02/26/19  Educator  [facility]  Instruction Review Code  1- Verbalizes Understanding      Anatomy & Physiology of the Heart: - Group verbal and written instruction and models provide basic cardiac anatomy and physiology, with the coronary electrical and arterial systems. Review of Valvular disease and Heart Failure   Cardiac Medications: - Group verbal and written instruction to review commonly prescribed medications for heart disease. Reviews the medication, class of the drug, and side effects.   Know Your Numbers and Risk Factors: -Group verbal and written instruction about important  numbers in your health.  Discussion of what are risk factors and how they play a role in the disease process.  Review of Cholesterol, Blood Pressure, Diabetes, and BMI and the role they play in your overall health.   Pulmonary Rehab from 02/26/2019 in [facility] Cardiac and Pulmonary Rehab  Date  02/12/19  Educator  [facility]  Instruction Review Code  1- Verbalizes Understanding      Sleep Hygiene: -Provides group verbal and written instruction about how sleep can affect your health.  Define sleep hygiene, discuss sleep cycles and impact of sleep  habits. Review good sleep hygiene tips.    Other: -Provides group and verbal instruction on various topics (see comments)    Knowledge Questionnaire Score: Knowledge Questionnaire Score - 12/23/18 1423      Knowledge Questionnaire Score   Pre Score  16/18   reviewed with patient       Core Components/Risk Factors/Patient Goals at Admission: Personal Goals and Risk Factors at Admission - 12/23/18 1418      Core Components/Risk Factors/Patient Goals on Admission    Weight Management  Yes;Weight Loss    Intervention  Weight Management: Develop a combined nutrition and exercise program designed to reach desired caloric intake, while maintaining appropriate intake of nutrient and fiber, sodium and fats, and appropriate energy expenditure required for the weight goal.;Weight Management: Provide education and appropriate resources to help participant work on and attain dietary goals.    Admit Weight  209 lb 6.4 oz (95 kg)    Goal Weight: Short Term  204 lb (92.5 kg)    Goal Weight: Long Term  180 lb (81.6 kg)    Expected Outcomes  Short Term: Continue to assess and modify interventions until short term weight is achieved;Long Term: Adherence to nutrition and physical activity/exercise program aimed toward attainment of established weight goal;Weight Loss: Understanding of general recommendations for a balanced deficit meal plan, which promotes 1-2 lb weight  loss per week and includes a negative energy balance of [phone] kcal/d;Understanding recommendations for meals to include 15-35% energy as protein, 25-35% energy from fat, 35-60% energy from carbohydrates, less than '200mg'$  of dietary cholesterol, 20-35 gm of total fiber daily;Understanding of distribution of calorie intake throughout the day with the consumption of 4-5 meals/snacks    Improve shortness of breath with ADL's  Yes    Intervention  Provide education, individualized exercise plan and daily activity instruction to help decrease symptoms of SOB with activities of daily living.    Expected Outcomes  Short Term: Improve cardiorespiratory fitness to achieve a reduction of symptoms when performing ADLs;Long Term: Be able to perform more ADLs without symptoms or delay the onset of symptoms    Hypertension  Yes   AFIB   Intervention  Provide education on lifestyle modifcations including regular physical activity/exercise, weight management, moderate sodium restriction and increased consumption of fresh fruit, vegetables, and low fat dairy, alcohol moderation, and smoking cessation.;Monitor prescription use compliance.    Expected Outcomes  Short Term: Continued assessment and intervention until BP is < 140/3m HG in hypertensive participants. < 130/863mHG in hypertensive participants with diabetes, heart failure or chronic kidney disease.;Long Term: Maintenance of blood pressure at goal levels.    Lipids  Yes    Intervention  Provide education and support for participant on nutrition & aerobic/resistive exercise along with prescribed medications to achieve LDL '70mg'$ , HDL >'40mg'$ .    Expected Outcomes  Short Term: Participant states understanding of desired cholesterol values and is compliant with medications prescribed. Participant is following exercise prescription and nutrition guidelines.;Long Term: Cholesterol controlled with medications as prescribed, with individualized exercise RX and with  personalized nutrition plan. Value goals: LDL < '70mg'$ , HDL > 40 mg.       Core Components/Risk Factors/Patient Goals Review:  Goals and Risk Factor Review    Row Name 01/13/19 1651 02/19/19 1233 03/12/19 1510         Core Components/Risk Factors/Patient Goals Review   Personal Goals Review  Weight Management/Obesity;Improve shortness of breath with ADL's;Hypertension  Weight Management/Obesity;Improve shortness of breath with ADL's;Hypertension  Weight Management/Obesity;Improve shortness of breath with ADL's;Hypertension     Review  Darius Norris has been doing well in the program and is on his way to breathing easier. He is trying to improve his ADl's. His blood pressure has been ok since the start of the program. He still gets a little winded before starting class but has a positive attitude.  Darius Norris had a blood pressure of 140s over 70 today. He has been talking to his doctor and switching up his medications. He has been hesitant to take lisinopril due to his heart rate. Sometimes the medications makes him feels a certain way and he is trying to figure it out. He states he fell out of bed last Friday night. He has neuropathy in his legs and sleeps at the edge of the bed. He had got a little bruised but nothing serious. Darius Norris wants to get a bed rail since he sleeps at the edge of the bed.  Darius Norris has been doing well with his pressures and weight.  His breathing continues to improve and he is trying to stay active at home.      Expected Outcomes  Short: Attend [name] regularly to improve shortness of breath with ADL's. Long: maintain independence with ADL's   Short: figure out the best way to take his medications while monitoring HR. Long: maintain medication prescription.  Short: Continue to monitor his breathing.  Long: Continue to monitor risk factors.         Core Components/Risk Factors/Patient Goals at Discharge (Final Review):  Goals and Risk Factor Review - 03/12/19 1510      Core Components/Risk  Factors/Patient Goals Review   Personal Goals Review  Weight Management/Obesity;Improve shortness of breath with ADL's;Hypertension    Review  Darius Norris has been doing well with his pressures and weight.  His breathing continues to improve and he is trying to stay active at home.     Expected Outcomes  Short: Continue to monitor his breathing.  Long: Continue to monitor risk factors.        ITP Comments: ITP Comments    Row Name 12/23/18 1525 12/30/18 0849 01/27/19 0842 02/24/19 0824 03/10/19 0935   ITP Comments  Medical Evaluation completed. Chart sent for review and changes to Dr. Emily Filbert Director of [place]. Diagnosis can be found in Care everywhere/media.  30 day review completed. ITP sent to Dr. Emily Filbert Director of [place]. Continue with ITP unless changes are made by physician.  30 day review completed. ITP sent to Dr. Emily Filbert Director of [place]. Continue with ITP unless changes are made by physician.  30 day review completed. ITP sent to Dr. Emily Filbert Director of [place]. Continue with ITP unless changes are made by physician.  Our program is currently closed due to COVID-19.  We are communicating with patient via phone calls and emails.   [place] Name 03/24/19 0941           ITP Comments  30 day review completed. ITP sent to Dr. Emily Filbert for review,changes as needed and signature. Continue with ITP unless changes directed by Dr. Sabra Heck.           Comments:

## 2019-04-21 ENCOUNTER — Telehealth: Payer: Self-pay | Admitting: *Deleted

## 2019-04-21 ENCOUNTER — Encounter: Payer: Self-pay | Admitting: *Deleted

## 2019-04-21 DIAGNOSIS — J449 Chronic obstructive pulmonary disease, unspecified: Secondary | ICD-10-CM

## 2019-04-21 NOTE — Telephone Encounter (Signed)
Called to check up on patient.  Left message.  Last call attempt was on 4/13 and a message left then as well.

## 2019-06-11 ENCOUNTER — Encounter: Payer: Self-pay | Admitting: *Deleted

## 2019-06-11 DIAGNOSIS — J449 Chronic obstructive pulmonary disease, unspecified: Secondary | ICD-10-CM

## 2019-06-23 IMAGING — CR DG CHEST 2V
1 series · 2 of 2 positions shown · non-contrast
Comparison: Prior radiograph from 05/02/2018.

CLINICAL DATA: Initial evaluation for intermittent chest tightness,
increasing shortness of breath.

EXAM:
CHEST - 2 VIEW

[Series 1: w chest pa · 0.14mm/px · 2 of 2 slices shown]
[im 1/2]
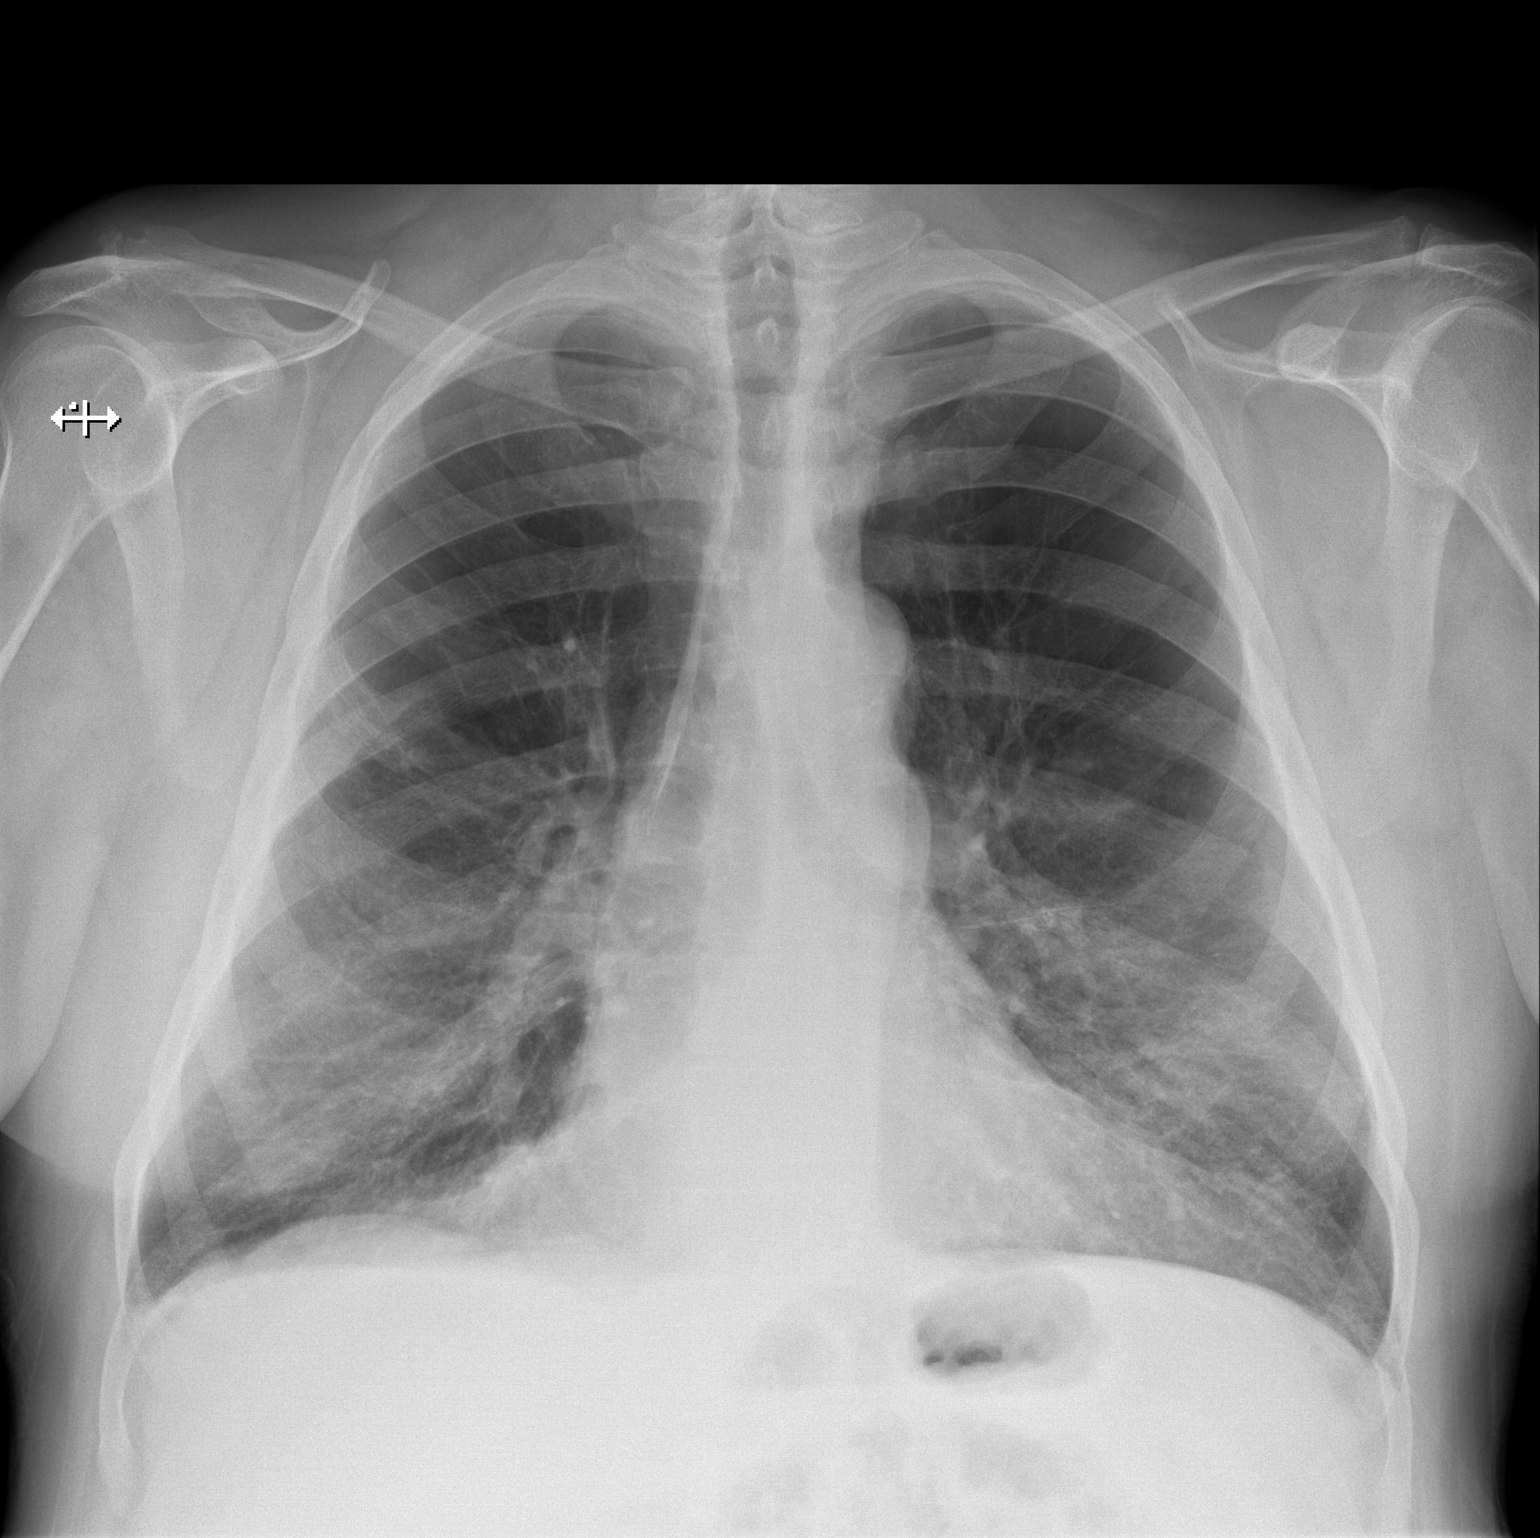
[im 2/2]
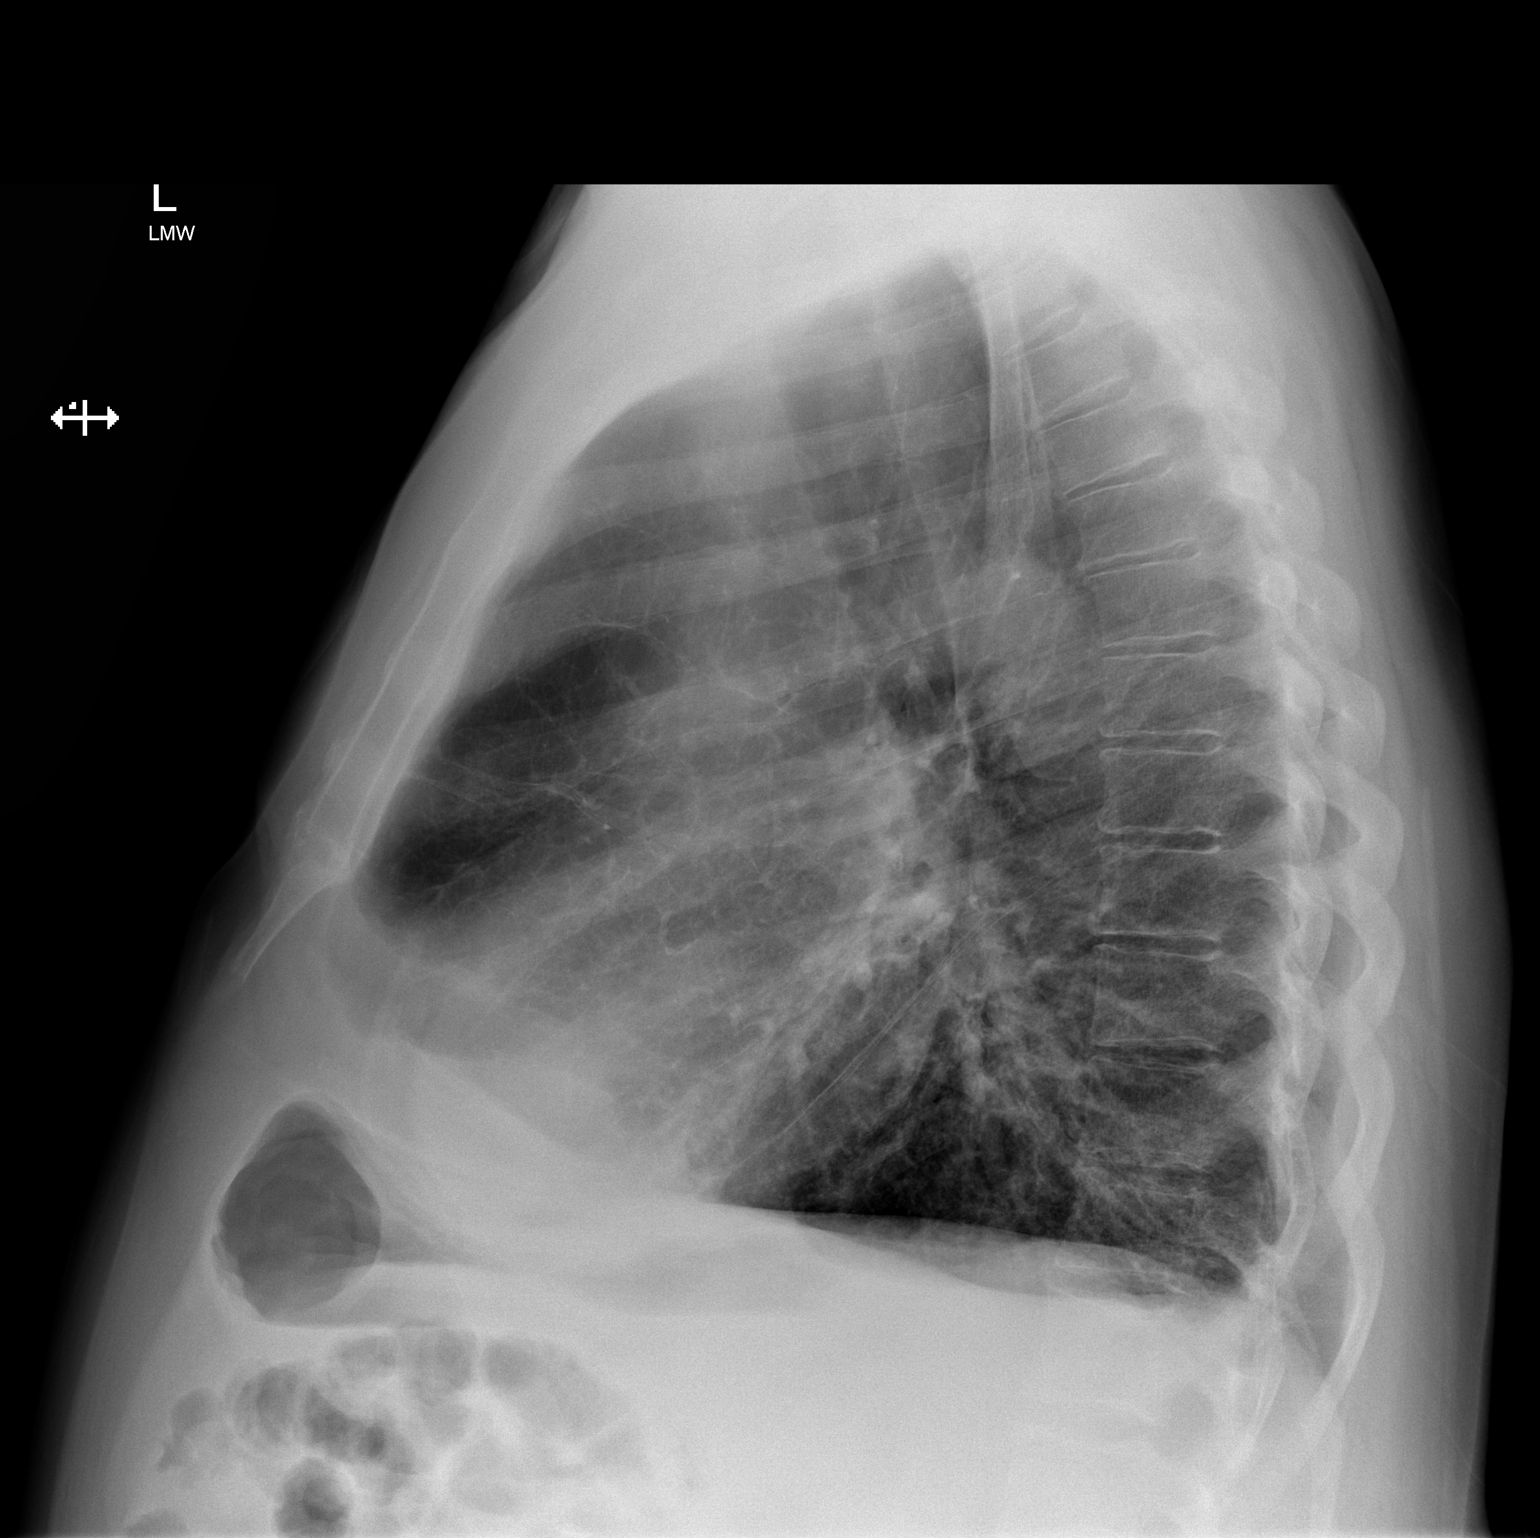

[2 of 2 positions shown; findings below may reference images not displayed]

FINDINGS: Cardiac and mediastinal silhouettes are stable in size and contour,
and remain within normal limits.

Lungs are hyperinflated with attenuation of the pulmonary markings
superiorly, consistent with emphysema. Bibasilar peribronchial
thickening, similar to previous, likely chronic. No other focal
infiltrates. No pulmonary edema or pleural effusion. No
pneumothorax.

No acute osseous abnormality.
IMPRESSION: 1. Emphysema. Associated bibasilar peribronchial thickening similar
to prior and is likely chronic.
2. No other active cardiopulmonary disease.

## 2019-07-03 IMAGING — DX DG CHEST 1V PORT
2 series · 2 of 2 positions shown · non-contrast
Comparison: Portable exam 6601 hours compared to 05/28/2018

CLINICAL DATA: Tachycardia

EXAM:
PORTABLE CHEST 1 VIEW

[chest ap (1 of 2)]
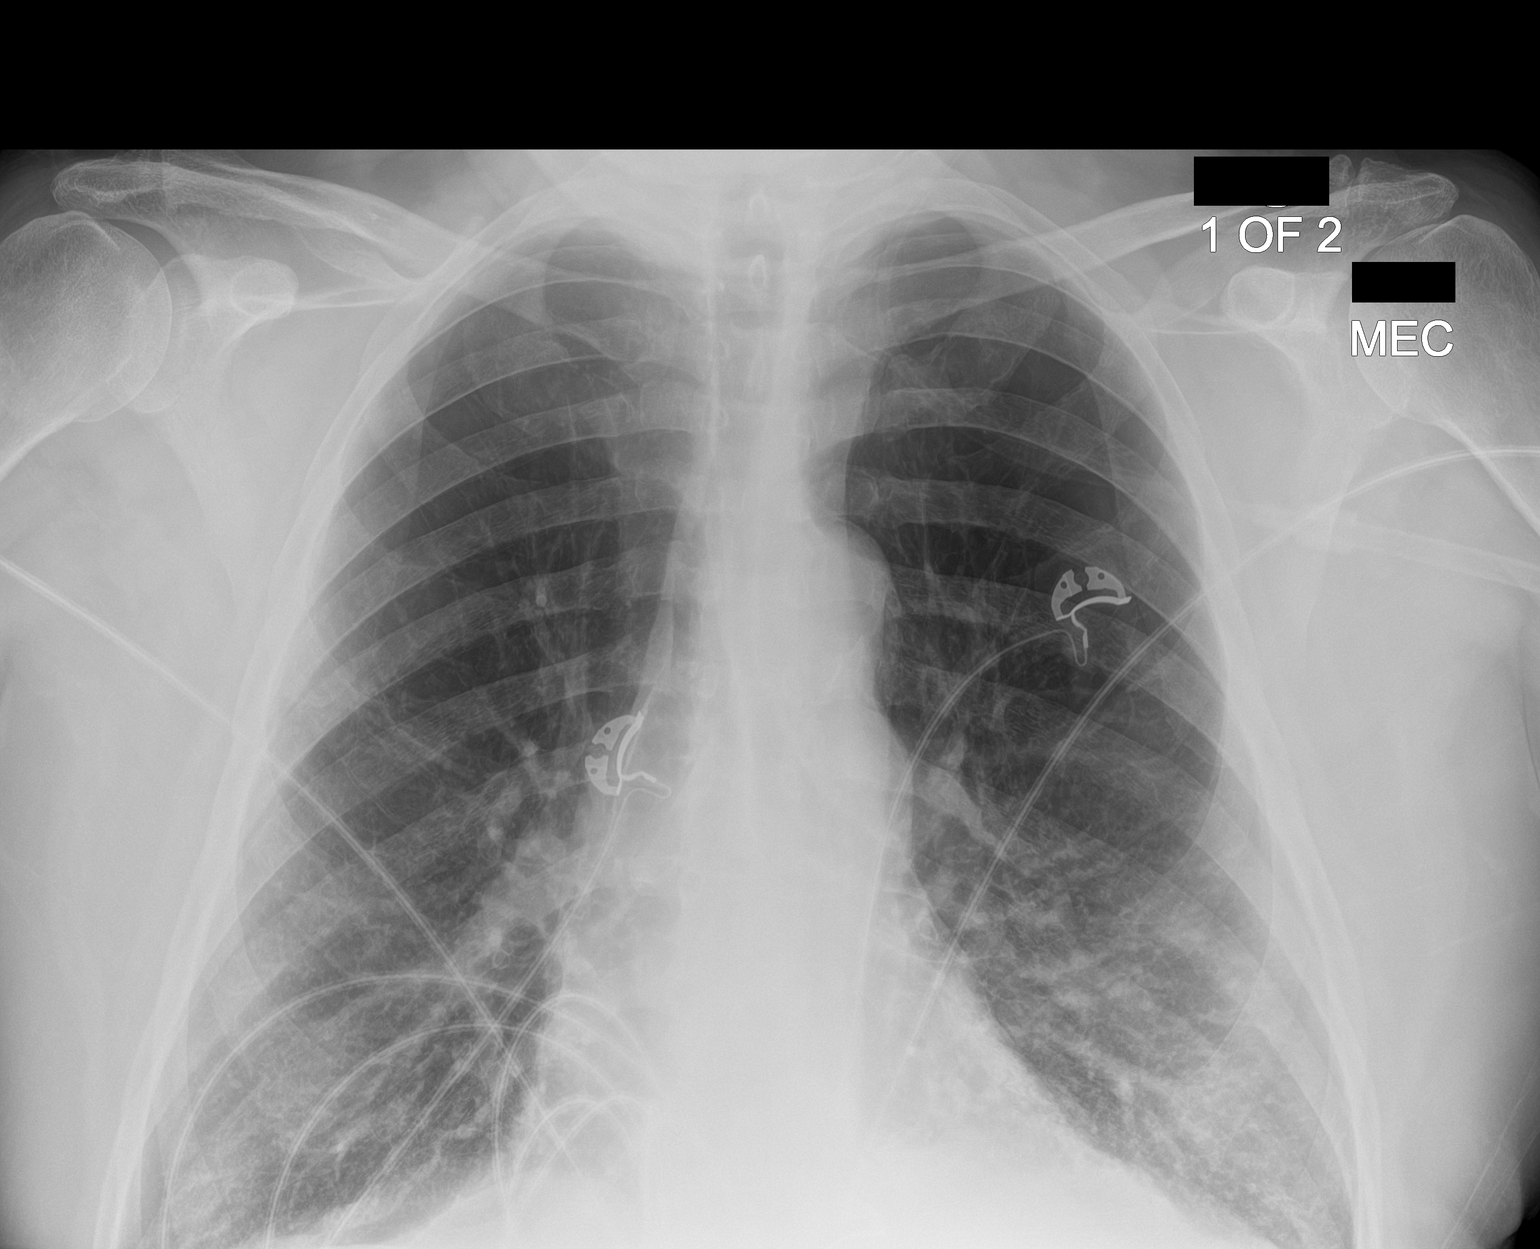

[chest ap (2 of 2)]
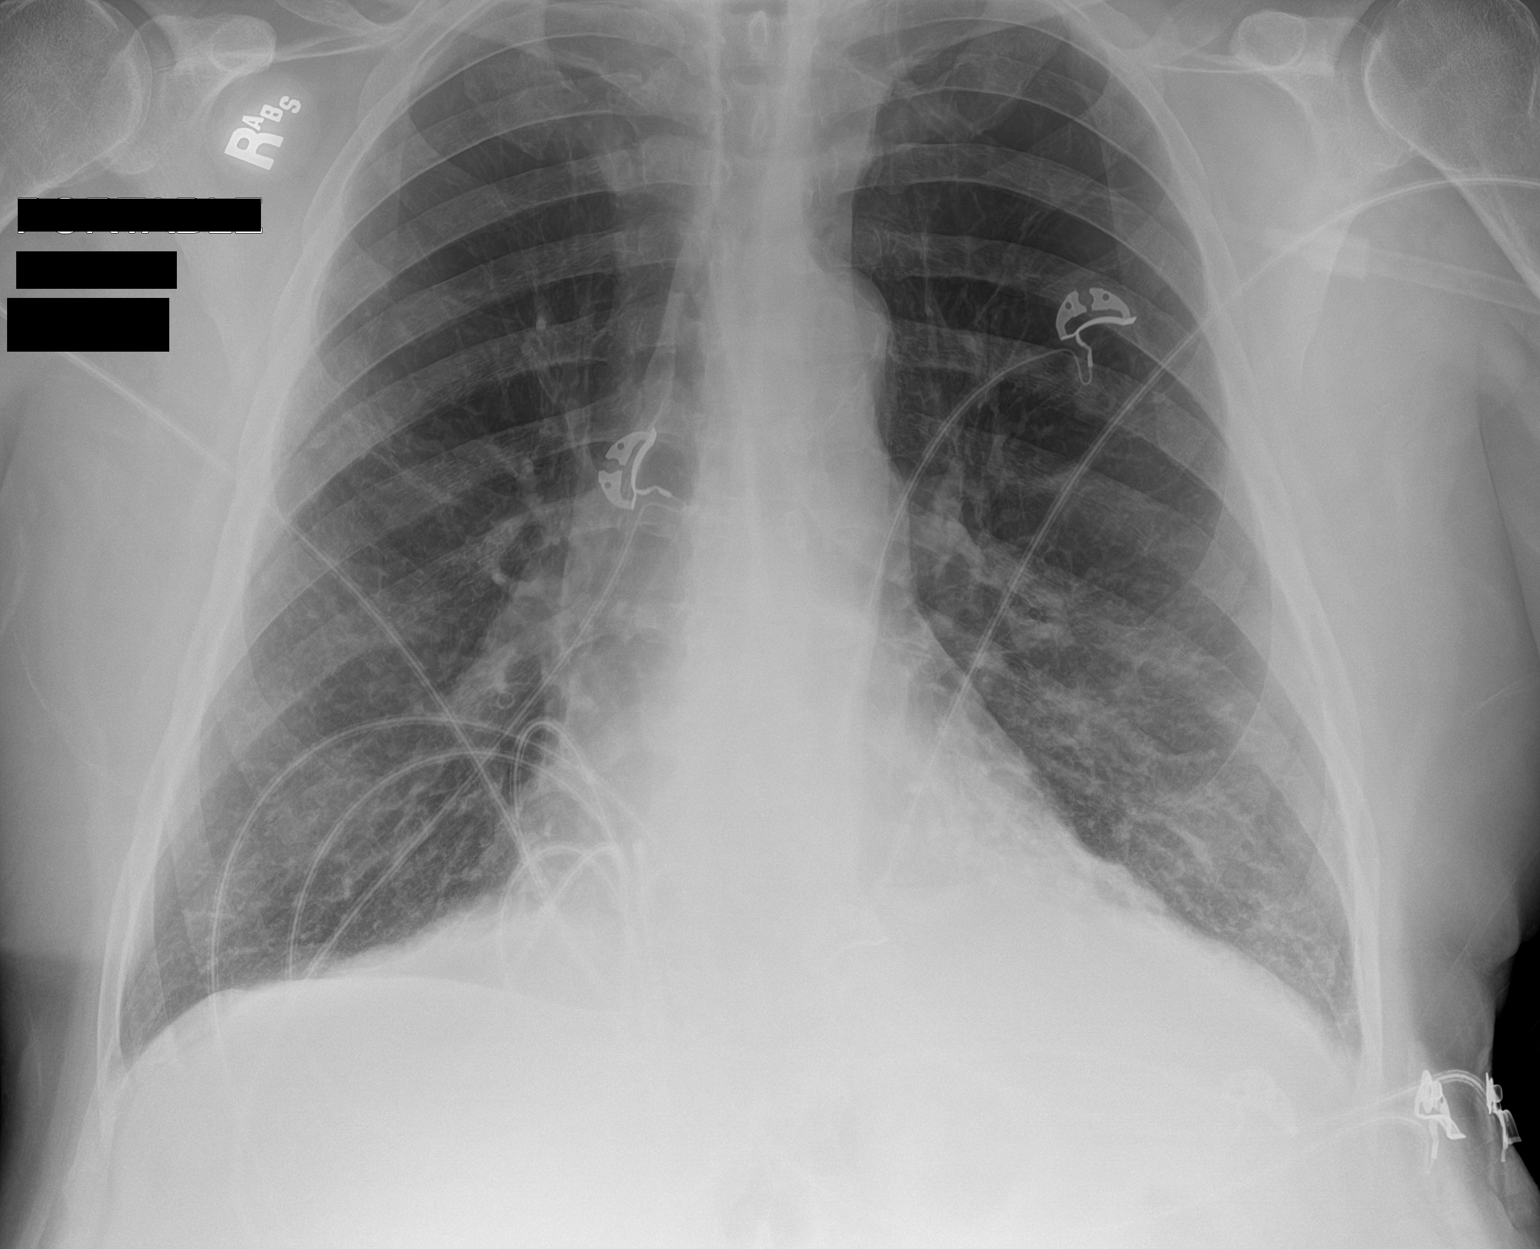

[2 of 2 positions shown; findings below may reference images not displayed]

FINDINGS: Normal heart size, mediastinal contours, and pulmonary vascularity.

Chronic bibasilar opacities not significantly changed, question
chronic interstitial disease/fibrosis.

Atherosclerotic calcification aortic arch.

Emphysematous changes in upper lobes.

No definite acute superimposed infiltrate, pleural effusion or
pneumothorax.
IMPRESSION: Chronic bibasilar opacities question fibrosis.

Emphysematous changes without definite acute infiltrate.

## 2019-09-04 DIAGNOSIS — J449 Chronic obstructive pulmonary disease, unspecified: Secondary | ICD-10-CM

## 2019-09-04 NOTE — Progress Notes (Signed)
Pulmonary Individual Treatment Plan  Patient Details  Name: ESTANISLAO HARMON MRN: [id number] Date of Birth: 12-15-1951 Referring Provider:     Pulmonary Rehab from 12/23/2018 in [facility] Cardiac and Pulmonary Rehab  Referring Provider  Vernelle Emerald MD      Initial Encounter Date:    Pulmonary Rehab from 12/23/2018 in [facility] Cardiac and Pulmonary Rehab  Date  12/23/18      Visit Diagnosis: Chronic obstructive pulmonary disease, unspecified COPD type ([place])  Patient's Home Medications on Admission:  Current Outpatient Medications:  .  acetaminophen (TYLENOL) 325 MG tablet, Take 650 mg by mouth every 6 (six) hours as needed. , Disp: , Rfl:  .  amitriptyline (ELAVIL) 10 MG tablet, Take 15 mg by mouth at bedtime. , Disp: , Rfl:  .  aspirin EC 81 MG EC tablet, Take 1 tablet (81 mg total) by mouth daily., Disp: 180 tablet, Rfl: 0 .  atorvastatin (LIPITOR) 80 MG tablet, Take 1 tablet (80 mg total) by mouth at bedtime., Disp: 30 tablet, Rfl: 0 .  budesonide-formoterol (SYMBICORT) 160-4.5 MCG/ACT inhaler, Inhale 2 puffs into the lungs 2 (two) times daily., Disp: , Rfl:  .  busPIRone (BUSPAR) 10 MG tablet, Take 10 mg by mouth 2 (two) times daily., Disp: , Rfl:  .  clobetasol cream (TEMOVATE) 0.62 %, Apply 1 application topically daily as needed. For itching on psoriasis areas, Disp: , Rfl:  .  dabigatran (PRADAXA) 150 MG CAPS capsule, Take 150 mg by mouth 2 (two) times daily., Disp: , Rfl:  .  diltiazem (CARDIZEM CD) 180 MG 24 hr capsule, Take 1 capsule (180 mg total) by mouth daily., Disp: 90 capsule, Rfl: 0 .  fluticasone (FLONASE) 50 MCG/ACT nasal spray, Place 2 sprays into both nostrils daily., Disp: , Rfl:  .  gabapentin (NEURONTIN) 100 MG capsule, Take 100 mg by mouth at bedtime., Disp: , Rfl:  .  Ipratropium-Albuterol (COMBIVENT RESPIMAT) 20-100 MCG/ACT AERS respimat, Inhale 1 puff into the lungs every 6 (six) hours as needed for wheezing or shortness of breath., Disp: , Rfl:  .  isosorbide  mononitrate (IMDUR) 60 MG 24 hr tablet, Take 60 mg by mouth daily., Disp: , Rfl:  .  lisinopril (PRINIVIL,ZESTRIL) 40 MG tablet, Take 20 mg by mouth daily. , Disp: , Rfl:  .  loratadine (CLARITIN) 10 MG tablet, Take 10 mg by mouth daily as needed for allergies. , Disp: , Rfl:  .  metoprolol tartrate (LOPRESSOR) 100 MG tablet, Take 1 tablet (100 mg total) by mouth 2 (two) times daily., Disp: 60 tablet, Rfl: 0 .  omeprazole (PRILOSEC) 20 MG capsule, Take 20 mg by mouth daily. , Disp: , Rfl:  .  potassium chloride (K-DUR,KLOR-CON) 10 MEQ tablet, Take 10 mEq by mouth daily., Disp: , Rfl:  .  senna (SENOKOT) 8.6 MG TABS tablet, Take 1 tablet by mouth daily as needed for mild constipation., Disp: , Rfl:  .  traZODone (DESYREL) 50 MG tablet, Take 1 tablet (50 mg total) by mouth at bedtime as needed for sleep., Disp: 30 tablet, Rfl: 0  Past Medical History: Past Medical History:  Diagnosis Date  . A-fib ([place])   . CAD (coronary artery disease)   . Colitis   . COPD (chronic obstructive pulmonary disease) ([place])   . Emphysema lung ([place])   . GERD (gastroesophageal reflux disease)   . Hypertension   . MI (myocardial infarction) ([place]) 1999    Tobacco Use: Social History   Tobacco Use  Smoking Status Former  Smoker  . Packs/day: 1.00  . Years: 30.00  . Pack years: 30.00  . Types: Cigarettes  . Quit date: 12/18/2001  . Years since quitting: 17.7  Smokeless Tobacco Never Used    Labs: Recent Chemical engineer    Labs for ITP Cardiac and Pulmonary Rehab Latest Ref Rng & Units 02/04/2013 09/04/2016 05/02/2018 01/05/2019   Cholestrol 0 - 200 mg/dL 185 116 - -   LDLCALC 0 - 99 mg/dL 114(H) 51 - -   HDL >40 mg/dL 38(L) 42 - -   Trlycerides <150 mg/dL 166 116 - -   Hemoglobin A1c 4.8 - 5.6 % - 6.4(H) - -   HCO3 20.0 - 28.0 mmol/L - - 36.3(H) 29.6(H)   O2SAT % - - - 81.7       Pulmonary Assessment Scores:   UCSD: Self-administered rating of dyspnea associated with activities of daily living  (ADLs) 6-point scale (0 = "not at all" to 5 = "maximal or unable to do because of breathlessness")  Scoring Scores range from 0 to 120.  Minimally important difference is 5 units  CAT: CAT can identify the health impairment of COPD patients and is better correlated with disease progression.  CAT has a scoring range of zero to 40. The CAT score is classified into four groups of low (less than 10), medium (10 - 20), high (21-30) and very high (31-40) based on the impact level of disease on health status. A CAT score over 10 suggests significant symptoms.  A worsening CAT score could be explained by an exacerbation, poor medication adherence, poor inhaler technique, or progression of COPD or comorbid conditions.  CAT MCID is 2 points  mMRC: mMRC (Modified Medical Research Council) Dyspnea Scale is used to assess the degree of baseline functional disability in patients of respiratory disease due to dyspnea. No minimal important difference is established. A decrease in score of 1 point or greater is considered a positive change.   Pulmonary Function Assessment:   Exercise Target Goals: Exercise Program Goal: Individual exercise prescription set using results from initial 6 min walk test and THRR while considering  patient's activity barriers and safety.   Exercise Prescription Goal: Initial exercise prescription builds to 30-45 minutes a day of aerobic activity, 2-3 days per week.  Home exercise guidelines will be given to patient during program as part of exercise prescription that the participant will acknowledge.  Activity Barriers & Risk Stratification:   6 Minute Walk:  Oxygen Initial Assessment:   Oxygen Re-Evaluation: Oxygen Re-Evaluation    Row Name 03/12/19 1509 04/21/19 1148 06/11/19 2047         Program Oxygen Prescription   Program Oxygen Prescription  Continuous  Continuous  Continuous     Liters per minute  '3  3  3       '$ Home Oxygen   Home Oxygen Device  Home  Concentrator;E-Tanks  Home Concentrator;E-Tanks  Home Concentrator;E-Tanks     Sleep Oxygen Prescription  Continuous  Continuous  Continuous     Liters per minute  2  -  2     Home Exercise Oxygen Prescription  Continuous  Continuous  Continuous     Liters per minute  '2  2  2     '$ Home at Rest Exercise Oxygen Prescription  Continuous  Continuous  Continuous     Liters per minute  '2  2  2     '$ Compliance with Home Oxygen Use  -  -  Yes  Goals/Expected Outcomes   Short Term Goals  To learn and exhibit compliance with exercise, home and travel O2 prescription;To learn and understand importance of monitoring SPO2 with pulse oximeter and demonstrate accurate use of the pulse oximeter.;To learn and understand importance of maintaining oxygen saturations>88%;To learn and demonstrate proper pursed lip breathing techniques or other breathing techniques.;To learn and demonstrate proper use of respiratory medications  To learn and exhibit compliance with exercise, home and travel O2 prescription;To learn and understand importance of monitoring SPO2 with pulse oximeter and demonstrate accurate use of the pulse oximeter.;To learn and understand importance of maintaining oxygen saturations>88%;To learn and demonstrate proper pursed lip breathing techniques or other breathing techniques.;To learn and demonstrate proper use of respiratory medications  To learn and exhibit compliance with exercise, home and travel O2 prescription;To learn and understand importance of monitoring SPO2 with pulse oximeter and demonstrate accurate use of the pulse oximeter.;To learn and understand importance of maintaining oxygen saturations>88%;To learn and demonstrate proper pursed lip breathing techniques or other breathing techniques.;To learn and demonstrate proper use of respiratory medications     Long  Term Goals  Exhibits compliance with exercise, home and travel O2 prescription;Verbalizes importance of monitoring SPO2 with  pulse oximeter and return demonstration;Maintenance of O2 saturations>88%;Exhibits proper breathing techniques, such as pursed lip breathing or other method taught during program session;Compliance with respiratory medication;Demonstrates proper use of MDI's  Exhibits compliance with exercise, home and travel O2 prescription;Verbalizes importance of monitoring SPO2 with pulse oximeter and return demonstration;Maintenance of O2 saturations>88%;Exhibits proper breathing techniques, such as pursed lip breathing or other method taught during program session;Compliance with respiratory medication;Demonstrates proper use of MDI's  Exhibits compliance with exercise, home and travel O2 prescription;Verbalizes importance of monitoring SPO2 with pulse oximeter and return demonstration;Maintenance of O2 saturations>88%;Exhibits proper breathing techniques, such as pursed lip breathing or other method taught during program session;Compliance with respiratory medication;Demonstrates proper use of MDI's     Comments  Darius Norris is doing well at home.  He is staying compliant with his meds and oxygen therapy.  He is using his PLB and finding it to be helpful.    He checks in with COPD action place for breathing and weight.   Darius Norris has noticed that he is using his albuterol a little more than normal.  He is planning to talk to doctor about increasing his dose.  He is using his PLB.   Darius Norris is compliant with medication and oxygen use. He uses PLB as needed.     Goals/Expected Outcomes  Short: Continue to work on PLB and using action plan.  Long: Continued compliance.   Short: Talk to doctor about albueterol  Long: Continue to use PLB  Short: get back to exercising to help with SOB. Long: independence oxygen and SOB management        Oxygen Discharge (Final Oxygen Re-Evaluation): Oxygen Re-Evaluation - 06/11/19 2047      Program Oxygen Prescription   Program Oxygen Prescription  Continuous    Liters per minute  3      Home  Oxygen   Home Oxygen Device  Home Concentrator;E-Tanks    Sleep Oxygen Prescription  Continuous    Liters per minute  2    Home Exercise Oxygen Prescription  Continuous    Liters per minute  2    Home at Rest Exercise Oxygen Prescription  Continuous    Liters per minute  2    Compliance with Home Oxygen Use  Yes  Goals/Expected Outcomes   Short Term Goals  To learn and exhibit compliance with exercise, home and travel O2 prescription;To learn and understand importance of monitoring SPO2 with pulse oximeter and demonstrate accurate use of the pulse oximeter.;To learn and understand importance of maintaining oxygen saturations>88%;To learn and demonstrate proper pursed lip breathing techniques or other breathing techniques.;To learn and demonstrate proper use of respiratory medications    Long  Term Goals  Exhibits compliance with exercise, home and travel O2 prescription;Verbalizes importance of monitoring SPO2 with pulse oximeter and return demonstration;Maintenance of O2 saturations>88%;Exhibits proper breathing techniques, such as pursed lip breathing or other method taught during program session;Compliance with respiratory medication;Demonstrates proper use of MDI's    Comments  Darius Norris is compliant with medication and oxygen use. He uses PLB as needed.    Goals/Expected Outcomes  Short: get back to exercising to help with SOB. Long: independence oxygen and SOB management       Initial Exercise Prescription:   Perform Capillary Blood Glucose checks as needed.  Exercise Prescription Changes: Exercise Prescription Changes    Row Name 03/12/19 1500             Response to Exercise   Blood Pressure (Admit)  126/72       Blood Pressure (Exit)  124/58       Heart Rate (Admit)  62 bpm       Heart Rate (Exercise)  74 bpm       Heart Rate (Exit)  71 bpm       Oxygen Saturation (Admit)  96 %       Oxygen Saturation (Exercise)  94 %       Oxygen Saturation (Exit)  96 %       Rating  of Perceived Exertion (Exercise)  13       Perceived Dyspnea (Exercise)  2       Symptoms  SOB       Duration  Continue with 45 min of aerobic exercise without signs/symptoms of physical distress.       Intensity  THRR unchanged         Progression   Progression  Continue to progress workloads to maintain intensity without signs/symptoms of physical distress.       Average METs  2.34         Resistance Training   Training Prescription  Yes       Weight  3 lbs       Reps  10-15         Interval Training   Interval Training  No         Oxygen   Oxygen  Continuous       Liters  3         Treadmill   MPH  1       Grade  0.5       Minutes  15       METs  1.83         NuStep   Level  3       Minutes  15       METs  2.6         REL-XR   Level  4       Minutes  15       METs  2.6         Home Exercise Plan   Plans to continue exercise at  Home (comment) walking, stationary bike  Frequency  Add 2 additional days to program exercise sessions.       Initial Home Exercises Provided  01/01/19          Exercise Comments:   Exercise Goals and Review:   Exercise Goals Re-Evaluation : Exercise Goals Re-Evaluation    Row Name 03/12/19 1506 04/21/19 1140 06/11/19 2042         Exercise Goal Re-Evaluation   Exercise Goals Review  Increase Physical Activity;Increase Strength and Stamina;Understanding of Exercise Prescription  Increase Physical Activity;Increase Strength and Stamina;Understanding of Exercise Prescription  Increase Physical Activity;Increase Strength and Stamina;Understanding of Exercise Prescription     Comments  Darius Norris has been doing well. He is doing his best to stay active at home.  He has been using his bike and walking some.  Overall, he feels better and continues to make improvements.  We will continue to montior his progress at home.   Darius Norris has been holding up pretty well.  He is using his recumbent bike at least three times a week.  He feels pretty good  overall.   Darius Norris reports not exercising as he should, but is hoping to get back to it. He hasnt watched the videos, but said he will be on the lookout for more.     Expected Outcomes  Short: Increase time of exercise at home to 30 min.  Long: Continue to increase activity levels.   Short: Continue to use bike at least 30 min each week.  Long: Continue to [facility] exercise.   Short: make time for exercise weekly. Long: independence with exercise routine        Discharge Exercise Prescription (Final Exercise Prescription Changes): Exercise Prescription Changes - 03/12/19 1500      Response to Exercise   Blood Pressure (Admit)  126/72    Blood Pressure (Exit)  124/58    Heart Rate (Admit)  62 bpm    Heart Rate (Exercise)  74 bpm    Heart Rate (Exit)  71 bpm    Oxygen Saturation (Admit)  96 %    Oxygen Saturation (Exercise)  94 %    Oxygen Saturation (Exit)  96 %    Rating of Perceived Exertion (Exercise)  13    Perceived Dyspnea (Exercise)  2    Symptoms  SOB    Duration  Continue with 45 min of aerobic exercise without signs/symptoms of physical distress.    Intensity  THRR unchanged      Progression   Progression  Continue to progress workloads to maintain intensity without signs/symptoms of physical distress.    Average METs  2.34      Resistance Training   Training Prescription  Yes    Weight  3 lbs    Reps  10-15      Interval Training   Interval Training  No      Oxygen   Oxygen  Continuous    Liters  3      Treadmill   MPH  1    Grade  0.5    Minutes  15    METs  1.83      NuStep   Level  3    Minutes  15    METs  2.6      REL-XR   Level  4    Minutes  15    METs  2.6      Home Exercise Plan   Plans to continue exercise at  Home (comment)   walking, stationary  bike   Frequency  Add 2 additional days to program exercise sessions.    Initial Home Exercises Provided  01/01/19       Nutrition:  Target Goals: Understanding of nutrition guidelines, daily  intake of sodium '1500mg'$ , cholesterol '200mg'$ , calories 30% from fat and 7% or less from saturated fats, daily to have 5 or more servings of fruits and vegetables.  Biometrics:    Nutrition Therapy Plan and Nutrition Goals:   Nutrition Assessments:   Nutrition Goals Re-Evaluation:   Nutrition Goals Discharge (Final Nutrition Goals Re-Evaluation):   Psychosocial: Target Goals: Acknowledge presence or absence of significant depression and/or stress, maximize coping skills, provide positive support system. Participant is able to verbalize types and ability to use techniques and skills needed for reducing stress and depression.   Initial Review & Psychosocial Screening:   Quality of Life Scores:  Scores of 19 and below usually indicate a poorer quality of life in these areas.  A difference of  2-3 points is a clinically meaningful difference.  A difference of 2-3 points in the total score of the Quality of Life Index has been associated with significant improvement in overall quality of life, self-image, physical symptoms, and general health in studies assessing change in quality of life.  PHQ-9: Recent Review Flowsheet Data    Depression screen [facility] 2/9 02/26/2019 01/29/2019 12/23/2018 11/20/2016 06/27/2016   Decreased Interest 1 0 2 1 0   Down, Depressed, Hopeless 0 0 0 1 0   PHQ - 2 Score 1 0 2 2 0   Altered sleeping '2 2 2 1 1   '$ Tired, decreased energy '2 1 3 2 2   '$ Change in appetite '1 2 1 1 1   '$ Feeling bad or failure about yourself  0 0 0 0 0   Trouble concentrating 0 0 0 0 0   Moving slowly or fidgety/restless 0 0 1 0 0   Suicidal thoughts 0 0 0 0 0   PHQ-9 Score '6 5 9 6 4   '$ Difficult doing work/chores Somewhat difficult Somewhat difficult Very difficult Very difficult Somewhat difficult     Interpretation of Total Score  Total Score Depression Severity:  1-4 = Minimal depression, 5-9 = Mild depression, 10-14 = Moderate depression, 15-19 = Moderately severe depression, 20-27 =  Severe depression   Psychosocial Evaluation and Intervention:   Psychosocial Re-Evaluation: Psychosocial Re-Evaluation    Row Name 03/12/19 1512 04/21/19 1142 06/11/19 2046         Psychosocial Re-Evaluation   Current issues with  Current Stress Concerns;Current Anxiety/Panic  Current Stress Concerns  Current Stress Concerns     Comments  Darius Norris is doing well at home.  He is trying to stay active and not dwell on things.  His sleep issues have continued but he is dealing with it the best he can.  He is going to try to stay postive.   [facility] all [facility] upcoming appointments to telephone calls for a virtual visit.   Darius Norris is doing well at home. His wife has been able to continue to work during all of this.  He is getting a little cabin fever, but is tolerating it well.  He is hanging in there, but eager to get back to class.   Mike's wife continues to work during the pandemic which has helped keep their stress levels lower than if not. He is ready to get out and about, but knows he is high risk. He talks with family and  friends on phone which helps     Expected Outcomes  Short: Continue to stay active.  Long: Continue to stay postive.   Short: Continue to exercise.  Long: Continue to cope well.   Short: add some physical activity throughout the day. Long: practice self care     Interventions  -  Encouraged to attend Pulmonary Rehabilitation for the exercise  Encouraged to attend Pulmonary Rehabilitation for the exercise     Continue Psychosocial Services   -  Follow up required by staff  Follow up required by staff        Psychosocial Discharge (Final Psychosocial Re-Evaluation): Psychosocial Re-Evaluation - 06/11/19 2046      Psychosocial Re-Evaluation   Current issues with  Current Stress Concerns    Comments  Mike's wife continues to work during the pandemic which has helped keep their stress levels lower than if not. He is ready to get out and about, but knows he is high risk. He talks  with family and friends on phone which helps    Expected Outcomes  Short: add some physical activity throughout the day. Long: practice self care    Interventions  Encouraged to attend Pulmonary Rehabilitation for the exercise    Continue Psychosocial Services   Follow up required by staff       Education: Education Goals: Education classes will be provided on a weekly basis, covering required topics. Participant will state understanding/return demonstration of topics presented.  Learning Barriers/Preferences:   Education Topics:  Initial Evaluation Education: - Verbal, written and demonstration of respiratory meds, oximetry and breathing techniques. Instruction on use of nebulizers and MDIs and importance of monitoring MDI activations.   Pulmonary Rehab from 02/26/2019 in [facility] Cardiac and Pulmonary Rehab  Date  12/23/18  Educator  [facility]  Instruction Review Code  1- Verbalizes Understanding      General Nutrition Guidelines/Fats and Fiber: -Group instruction provided by verbal, written material, models and posters to present the general guidelines for heart healthy nutrition. Gives an explanation and review of dietary fats and fiber.   Pulmonary Rehab from 02/26/2019 in [facility] Cardiac and Pulmonary Rehab  Date  01/29/19  Educator  [facility]  Instruction Review Code  1- Verbalizes Understanding      Controlling Sodium/Reading Food Labels: -Group verbal and written material supporting the discussion of sodium use in heart healthy nutrition. Review and explanation with models, verbal and written materials for utilization of the food label.   Pulmonary Rehab from 02/26/2019 in [facility] Cardiac and Pulmonary Rehab  Date  02/05/19  Educator  [facility]  Instruction Review Code  1- Verbalizes Understanding      Exercise Physiology & General Exercise Guidelines: - Group verbal and written instruction with models to review the exercise physiology of the cardiovascular system and associated critical values.  Provides general exercise guidelines with specific guidelines to those with heart or lung disease.    Pulmonary Rehab from 02/26/2019 in [facility] Cardiac and Pulmonary Rehab  Date  01/08/19  Educator  [facility]  Instruction Review Code  1- Verbalizes Understanding      Aerobic Exercise & Resistance Training: - Gives group verbal and written instruction on the various components of exercise. Focuses on aerobic and resistive training programs and the benefits of this training and how to safely progress through these programs.   Pulmonary Rehab from 02/26/2019 in [facility] Cardiac and Pulmonary Rehab  Date  01/10/19  Educator  [facility]  Instruction Review Code  1- Verbalizes Understanding  Flexibility, Balance, Mind/Body Relaxation: Provides group verbal/written instruction on the benefits of flexibility and balance training, including mind/body exercise modes such as yoga, pilates and tai chi.  Demonstration and skill practice provided.   Pulmonary Rehab from 02/26/2019 in [facility] Cardiac and Pulmonary Rehab  Date  01/15/19  Educator  [facility]  Instruction Review Code  1- Verbalizes Understanding      Stress and Anxiety: - Provides group verbal and written instruction about the health risks of elevated stress and causes of high stress.  Discuss the correlation between heart/lung disease and anxiety and treatment options. Review healthy ways to manage with stress and anxiety.   Depression: - Provides group verbal and written instruction on the correlation between heart/lung disease and depressed mood, treatment options, and the stigmas associated with seeking treatment.   Pulmonary Rehab from 02/26/2019 in [facility] Cardiac and Pulmonary Rehab  Date  02/19/19  Educator  [place]  Instruction Review Code  1- Verbalizes Understanding      Exercise & Equipment Safety: - Individual verbal instruction and demonstration of equipment use and safety with use of the equipment.   Pulmonary Rehab from 02/26/2019 in [facility] Cardiac and  Pulmonary Rehab  Date  12/23/18  Educator  [facility]  Instruction Review Code  1- Verbalizes Understanding      Infection Prevention: - Provides verbal and written material to individual with discussion of infection control including proper hand washing and proper equipment cleaning during exercise session.   Pulmonary Rehab from 02/26/2019 in [facility] Cardiac and Pulmonary Rehab  Date  12/23/18  Educator  [facility]  Instruction Review Code  1- Verbalizes Understanding      Falls Prevention: - Provides verbal and written material to individual with discussion of falls prevention and safety.   Pulmonary Rehab from 02/26/2019 in [facility] Cardiac and Pulmonary Rehab  Date  12/23/18  Educator  [facility]  Instruction Review Code  1- Verbalizes Understanding      Diabetes: - Individual verbal and written instruction to review signs/symptoms of diabetes, desired ranges of glucose level fasting, after meals and with exercise. Advice that pre and post exercise glucose checks will be done for 3 sessions at entry of program.   Chronic Lung Diseases: - Group verbal and written instruction to review updates, respiratory medications, advancements in procedures and treatments. Discuss use of supplemental oxygen including available portable oxygen systems, continuous and intermittent flow rates, concentrators, personal use and safety guidelines. Review proper use of inhaler and spacers. Provide informative websites for self-education.    Pulmonary Rehab from 02/26/2019 in [facility] Cardiac and Pulmonary Rehab  Date  01/24/19  Educator  [facility]  Instruction Review Code  1- Verbalizes Understanding      Energy Conservation: - Provide group verbal and written instruction for methods to conserve energy, plan and organize activities. Instruct on pacing techniques, use of adaptive equipment and posture/positioning to relieve shortness of breath.   Pulmonary Rehab from 05/24/2015 in [place]  Date   05/12/15  Educator  SW  Instruction Review Code (retired)  2- meets goals/outcomes      Triggers and Exacerbations: - Group verbal and written instruction to review types of environmental triggers and ways to prevent exacerbations. Discuss weather changes, air quality and the benefits of nasal washing. Review warning signs and symptoms to help prevent infections. Discuss techniques for effective airway clearance, coughing, and vibrations.   AED/CPR: - Group verbal and written instruction with the use of models to demonstrate the basic use of  the AED with the basic ABC's of resuscitation.   Pulmonary Rehab from 02/26/2019 in [facility] Cardiac and Pulmonary Rehab  Date  01/01/19  Educator  [facility]  Instruction Review Code  1- Actuary and Physiology of the Lungs: - Group verbal and written instruction with the use of models to provide basic lung anatomy and physiology related to function, structure and complications of lung disease.   Pulmonary Rehab from 02/26/2019 in [facility] Cardiac and Pulmonary Rehab  Date  02/26/19  Educator  [facility]  Instruction Review Code  1- Verbalizes Understanding      Anatomy & Physiology of the Heart: - Group verbal and written instruction and models provide basic cardiac anatomy and physiology, with the coronary electrical and arterial systems. Review of Valvular disease and Heart Failure   Cardiac Medications: - Group verbal and written instruction to review commonly prescribed medications for heart disease. Reviews the medication, class of the drug, and side effects.   Know Your Numbers and Risk Factors: -Group verbal and written instruction about important numbers in your health.  Discussion of what are risk factors and how they play a role in the disease process.  Review of Cholesterol, Blood Pressure, Diabetes, and BMI and the role they play in your overall health.   Pulmonary Rehab from 02/26/2019 in [facility] Cardiac and Pulmonary Rehab   Date  02/12/19  Educator  [facility]  Instruction Review Code  1- Verbalizes Understanding      Sleep Hygiene: -Provides group verbal and written instruction about how sleep can affect your health.  Define sleep hygiene, discuss sleep cycles and impact of sleep habits. Review good sleep hygiene tips.    Other: -Provides group and verbal instruction on various topics (see comments)    Knowledge Questionnaire Score:    Core Components/Risk Factors/Patient Goals at Admission:   Core Components/Risk Factors/Patient Goals Review:  Goals and Risk Factor Review    Row Name 03/12/19 1510 04/21/19 1147 06/11/19 2044         Core Components/Risk Factors/Patient Goals Review   Personal Goals Review  Weight Management/Obesity;Improve shortness of breath with ADL's;Hypertension  Weight Management/Obesity;Improve shortness of breath with ADL's;Hypertension  Weight Management/Obesity;Improve shortness of breath with ADL's;Hypertension     Review  Darius Norris has been doing well with his pressures and weight.  His breathing continues to improve and he is trying to stay active at home.   Darius Norris is doing well at home.  He thinks his weight may be up some, but he is trying his best to hold steady.  He is doing okay with his breathing.   Darius Norris continues to do well, bp is stable. Weight is still a little up. Breathing has been harder with the change in weather, but he feels like it is also due to his lack of physical activity     Expected Outcomes  Short: Continue to monitor his breathing.  Long: Continue to monitor risk factors.   Short: Continue to keep his weight down.  Long: Continue to work on breathing.   Short: get back to exercise to help with breahting and weight loss. Long: manage risk factors.        Core Components/Risk Factors/Patient Goals at Discharge (Final Review):  Goals and Risk Factor Review - 06/11/19 2044      Core Components/Risk Factors/Patient Goals Review   Personal Goals Review  Weight  Management/Obesity;Improve shortness of breath with ADL's;Hypertension    Review  Darius Norris continues to do well,  bp is stable. Weight is still a little up. Breathing has been harder with the change in weather, but he feels like it is also due to his lack of physical activity    Expected Outcomes  Short: get back to exercise to help with breahting and weight loss. Long: manage risk factors.       ITP Comments: ITP Comments    Row Name 03/10/19 0935 03/24/19 0941 04/21/19 1138 09/04/19 1038     ITP Comments  Our program is currently closed due to COVID-19.  We are communicating with patient via phone calls and emails.  30 day review completed. ITP sent to Dr. Emily Filbert for review,changes as needed and signature. Continue with ITP unless changes directed by Dr. Sabra Heck.   Darius Norris called back to let us know how he was doing.   Patient never recieved clearance to return to [facility]. Discharge ITP sent to Dr. Sabra Heck Director of [place].       Comments: Discharge ITP

## 2019-09-04 NOTE — Progress Notes (Signed)
Discharge Progress Report  Patient Details  Name: Darius Norris MRN: [id number] Date of Birth: [birth year] Referring Provider:     Pulmonary Rehab from 12/23/2018 in [facility] Cardiac and Pulmonary Rehab  Referring Provider  Darius Emerald MD       Number of Visits: 21/36  Reason for Discharge:  Early Exit:  Never recieved clearance to return to [name]  Smoking History:  Social History   Tobacco Use  Smoking Status Former Smoker  . Packs/day: 1.00  . Years: 30.00  . Pack years: 30.00  . Types: Cigarettes  . Quit date: 12/18/2001  . Years since quitting: 17.7  Smokeless Tobacco Never Used    Diagnosis:  Chronic obstructive pulmonary disease, unspecified COPD type ([place])  ADL UCSD:   Initial Exercise Prescription:   Discharge Exercise Prescription (Final Exercise Prescription Changes): Exercise Prescription Changes - 03/12/19 1500      Response to Exercise   Blood Pressure (Admit)  126/72    Blood Pressure (Exit)  124/58    Heart Rate (Admit)  62 bpm    Heart Rate (Exercise)  74 bpm    Heart Rate (Exit)  71 bpm    Oxygen Saturation (Admit)  96 %    Oxygen Saturation (Exercise)  94 %    Oxygen Saturation (Exit)  96 %    Rating of Perceived Exertion (Exercise)  13    Perceived Dyspnea (Exercise)  2    Symptoms  SOB    Duration  Continue with 45 min of aerobic exercise without signs/symptoms of physical distress.    Intensity  THRR unchanged      Progression   Progression  Continue to progress workloads to maintain intensity without signs/symptoms of physical distress.    Average METs  2.34      Resistance Training   Training Prescription  Yes    Weight  3 lbs    Reps  10-15      Interval Training   Interval Training  No      Oxygen   Oxygen  Continuous    Liters  3      Treadmill   MPH  1    Grade  0.5    Minutes  15    METs  1.83      NuStep   Level  3    Minutes  15    METs  2.6      REL-XR   Level  4    Minutes  15    METs  2.6      Home Exercise Plan   Plans to continue exercise at  Home (comment)   walking, stationary bike   Frequency  Add 2 additional days to program exercise sessions.    Initial Home Exercises Provided  01/01/19       Functional Capacity:   Psychological, QOL, Others - Outcomes: PHQ 2/9: Depression screen [facility] 2/9 02/26/2019 01/29/2019 12/23/2018 11/20/2016 06/27/2016  Decreased Interest 1 0 2 1 0  Down, Depressed, Hopeless 0 0 0 1 0  PHQ - 2 Score 1 0 2 2 0  Altered sleeping 2 2 2 1 1   Tired, decreased energy 2 1 3 2 2   Change in appetite 1 2 1 1 1   Feeling bad or failure about yourself  0 0 0 0 0  Trouble concentrating 0 0 0 0 0  Moving slowly or fidgety/restless 0 0 1 0 0  Suicidal thoughts 0 0 0 0 0  PHQ-9 Score 6  5 9 6 4   Difficult doing work/chores Somewhat difficult Somewhat difficult Very difficult Very difficult Somewhat difficult    Quality of Life:   Personal Goals: Goals established at orientation with interventions provided to work toward goal.    Personal Goals Discharge: Goals and Risk Factor Review    Row Name 03/12/19 1510 04/21/19 1147 06/11/19 2044         Core Components/Risk Factors/Patient Goals Review   Personal Goals Review  Weight Management/Obesity;Improve shortness of breath with ADL's;Hypertension  Weight Management/Obesity;Improve shortness of breath with ADL's;Hypertension  Weight Management/Obesity;Improve shortness of breath with ADL's;Hypertension     Review  Darius Norris has been doing well with his pressures and weight.  His breathing continues to improve and he is trying to stay active at home.   Darius Norris is doing well at home.  He thinks his weight may be up some, but he is trying his best to hold steady.  He is doing okay with his breathing.   Darius Norris continues to do well, bp is stable. Weight is still a little up. Breathing has been harder with the change in weather, but he feels like it is also due to his lack of physical activity     Expected Outcomes  Short:  Continue to monitor his breathing.  Long: Continue to monitor risk factors.   Short: Continue to keep his weight down.  Long: Continue to work on breathing.   Short: get back to exercise to help with breahting and weight loss. Long: manage risk factors.        Exercise Goals and Review:   Exercise Goals Re-Evaluation: Exercise Goals Re-Evaluation    Row Name 03/12/19 1506 04/21/19 1140 06/11/19 2042         Exercise Goal Re-Evaluation   Exercise Goals Review  Increase Physical Activity;Increase Strength and Stamina;Understanding of Exercise Prescription  Increase Physical Activity;Increase Strength and Stamina;Understanding of Exercise Prescription  Increase Physical Activity;Increase Strength and Stamina;Understanding of Exercise Prescription     Comments  Darius Norris has been doing well. He is doing his best to stay active at home.  He has been using his bike and walking some.  Overall, he feels better and continues to make improvements.  We will continue to montior his progress at home.   Darius Norris has been holding up pretty well.  He is using his recumbent bike at least three times a week.  He feels pretty good overall.   Darius Norris reports not exercising as he should, but is hoping to get back to it. He hasnt watched the videos, but said he will be on the lookout for more.     Expected Outcomes  Short: Increase time of exercise at home to 30 min.  Long: Continue to increase activity levels.   Short: Continue to use bike at least 30 min each week.  Long: Continue to [facility] exercise.   Short: make time for exercise weekly. Long: independence with exercise routine        Nutrition & Weight - Outcomes:    Nutrition:   Nutrition Discharge:   Education Questionnaire Score:   Goals reviewed with patient; copy given to patient.

## 2019-12-18 ENCOUNTER — Other Ambulatory Visit: Payer: Self-pay

## 2019-12-18 ENCOUNTER — Encounter: Payer: No Typology Code available for payment source | Attending: General Practice | Admitting: *Deleted

## 2019-12-18 DIAGNOSIS — R0602 Shortness of breath: Secondary | ICD-10-CM

## 2019-12-18 NOTE — Progress Notes (Signed)
Darius Norris decided to hold off on Pulmonary Rehab until things calm down with COVID. He is concerned about the increase in numbers and he hesitant to risk coming out since he and his wife have stayed healthy during this time. Instructions given on how to obtain new referral when ready.

## 2019-12-26 ENCOUNTER — Institutional Professional Consult (permissible substitution): Payer: No Typology Code available for payment source | Admitting: Internal Medicine

## 2020-01-02 ENCOUNTER — Other Ambulatory Visit: Payer: No Typology Code available for payment source

## 2020-06-04 DIAGNOSIS — I6523 Occlusion and stenosis of bilateral carotid arteries: Secondary | ICD-10-CM | POA: Insufficient documentation

## 2021-06-01 ENCOUNTER — Other Ambulatory Visit: Payer: Self-pay

## 2021-06-01 ENCOUNTER — Encounter: Payer: No Typology Code available for payment source | Attending: Anesthesiology

## 2021-06-01 DIAGNOSIS — J449 Chronic obstructive pulmonary disease, unspecified: Secondary | ICD-10-CM | POA: Insufficient documentation

## 2021-06-01 DIAGNOSIS — Z9981 Dependence on supplemental oxygen: Secondary | ICD-10-CM | POA: Insufficient documentation

## 2021-06-01 NOTE — Progress Notes (Signed)
Virtual Visit completed. Patient informed on EP and RD appointment and 6 Minute walk test. Patient also informed of patient health questionnaires on My Chart. Patient Verbalizes understanding. Visit diagnosis can be found in [facility] [employer]. [facility] is [facility].

## 2021-06-13 ENCOUNTER — Other Ambulatory Visit: Payer: Self-pay

## 2021-06-13 VITALS — Ht 65.75 in | Wt 192.7 lb

## 2021-06-13 DIAGNOSIS — J449 Chronic obstructive pulmonary disease, unspecified: Secondary | ICD-10-CM

## 2021-06-13 DIAGNOSIS — Z9981 Dependence on supplemental oxygen: Secondary | ICD-10-CM | POA: Diagnosis not present

## 2021-06-13 NOTE — Progress Notes (Signed)
Pulmonary Individual Treatment Plan  Patient Details  Name: Darius Norris MRN: [id number] Date of Birth: 27-Feb-1951 Referring Provider:   Flowsheet Row Pulmonary Rehab from 06/13/2021 in [facility] Cardiac and Pulmonary Rehab  Referring Provider Jenne Pane, MD ([place])       Initial Encounter Date:  Flowsheet Row Pulmonary Rehab from 06/13/2021 in [facility] Cardiac and Pulmonary Rehab  Date 06/13/21       Visit Diagnosis: Chronic obstructive pulmonary disease, unspecified COPD type ([place])  Patient's Home Medications on Admission:  Current Outpatient Medications:    acetaminophen (TYLENOL) 325 MG tablet, Take 650 mg by mouth every 6 (six) hours as needed.  (Patient not taking: Reported on 06/01/2021), Disp: , Rfl:    acetaminophen (TYLENOL) 325 MG tablet, Take by mouth., Disp: , Rfl:    albuterol (VENTOLIN HFA) 108 (90 Base) MCG/ACT inhaler, Inhale into the lungs., Disp: , Rfl:    Albuterol Sulfate, sensor, (PROAIR DIGIHALER) 108 (90 Base) MCG/ACT AEPB, Inhale into the lungs., Disp: , Rfl:    amitriptyline (ELAVIL) 10 MG tablet, Take 15 mg by mouth at bedtime. , Disp: , Rfl:    amitriptyline (ELAVIL) 10 MG tablet, Take 2 tablets by mouth at bedtime., Disp: , Rfl:    aspirin EC 81 MG EC tablet, Take 1 tablet (81 mg total) by mouth daily., Disp: 180 tablet, Rfl: 0   atorvastatin (LIPITOR) 80 MG tablet, Take 1 tablet (80 mg total) by mouth at bedtime. (Patient not taking: Reported on 06/01/2021), Disp: 30 tablet, Rfl: 0   atorvastatin (LIPITOR) 80 MG tablet, Take by mouth., Disp: , Rfl:    budesonide-formoterol (SYMBICORT) 160-4.5 MCG/ACT inhaler, Inhale 2 puffs into the lungs 2 (two) times daily., Disp: , Rfl:    busPIRone (BUSPAR) 10 MG tablet, Take 10 mg by mouth 2 (two) times daily., Disp: , Rfl:    busPIRone (BUSPAR) 10 MG tablet, Take by mouth. (Patient not taking: Reported on 06/01/2021), Disp: , Rfl:    clobetasol (TEMOVATE) 0.05 % external solution, Apply topically., Disp: , Rfl:    clobetasol  cream (TEMOVATE) 1.44 %, Apply 1 application topically daily as needed. For itching on psoriasis areas, Disp: , Rfl:    clobetasol cream (TEMOVATE) 3.15 %, Apply 1 application topically daily as needed. For itching on psoriasis areas, Disp: , Rfl:    dabigatran (PRADAXA) 150 MG CAPS capsule, Take 150 mg by mouth 2 (two) times daily., Disp: , Rfl:    dabigatran (PRADAXA) 150 MG CAPS capsule, Take by mouth., Disp: , Rfl:    diltiazem (CARDIZEM CD) 180 MG 24 hr capsule, Take 1 capsule (180 mg total) by mouth daily. (Patient not taking: Reported on 06/01/2021), Disp: 90 capsule, Rfl: 0   diltiazem (TIAZAC) 180 MG 24 hr capsule, Take 1 capsule by mouth daily., Disp: , Rfl:    fluticasone (FLONASE) 50 MCG/ACT nasal spray, Place 2 sprays into both nostrils daily., Disp: , Rfl:    folic acid (FOLVITE) 1 MG tablet, Take by mouth., Disp: , Rfl:    gabapentin (NEURONTIN) 100 MG capsule, Take 100 mg by mouth at bedtime. (Patient not taking: Reported on 06/01/2021), Disp: , Rfl:    gabapentin (NEURONTIN) 300 MG capsule, Take by mouth., Disp: , Rfl:    guaifenesin (HUMIBID E) 400 MG TABS tablet, Take by mouth., Disp: , Rfl:    hydrocortisone 2.5 % cream, Apply topically., Disp: , Rfl:    hydrOXYzine (ATARAX/VISTARIL) 25 MG tablet, Take by mouth., Disp: , Rfl:    Ipratropium-Albuterol (COMBIVENT)  20-100 MCG/ACT AERS respimat, Inhale 1 puff into the lungs every 6 (six) hours as needed for wheezing or shortness of breath. (Patient not taking: Reported on 06/01/2021), Disp: , Rfl:    isosorbide mononitrate (IMDUR) 60 MG 24 hr tablet, Take 60 mg by mouth daily. (Patient not taking: Reported on 06/01/2021), Disp: , Rfl:    isosorbide mononitrate (IMDUR) 60 MG 24 hr tablet, Take by mouth., Disp: , Rfl:    lidocaine (XYLOCAINE) 5 % ointment, Apply topically., Disp: , Rfl:    lisinopril (PRINIVIL,ZESTRIL) 40 MG tablet, Take 20 mg by mouth daily.  (Patient not taking: Reported on 06/01/2021), Disp: , Rfl:    lisinopril  (ZESTRIL) 40 MG tablet, Take by mouth., Disp: , Rfl:    loratadine (CLARITIN) 10 MG tablet, Take 10 mg by mouth daily as needed for allergies. , Disp: , Rfl:    loratadine (CLARITIN) 10 MG tablet, Take 1 tablet by mouth daily. (Patient not taking: Reported on 06/01/2021), Disp: , Rfl:    Melatonin 3 MG CAPS, Take 6 mg by mouth., Disp: , Rfl:    metFORMIN (GLUCOPHAGE) 500 MG tablet, Take by mouth. (Patient not taking: Reported on 06/01/2021), Disp: , Rfl:    metFORMIN (GLUCOPHAGE-XR) 500 MG 24 hr tablet, Take by mouth., Disp: , Rfl:    methotrexate (RHEUMATREX) 2.5 MG tablet, Take by mouth., Disp: , Rfl:    metoprolol tartrate (LOPRESSOR) 100 MG tablet, Take 1 tablet (100 mg total) by mouth 2 (two) times daily. (Patient not taking: Reported on 06/01/2021), Disp: 60 tablet, Rfl: 0   metoprolol tartrate (LOPRESSOR) 50 MG tablet, Take by mouth., Disp: , Rfl:    mometasone (ASMANEX) 220 MCG/INH inhaler, Take by mouth., Disp: , Rfl:    omeprazole (PRILOSEC) 20 MG capsule, Take 20 mg by mouth daily. , Disp: , Rfl:    omeprazole (PRILOSEC) 20 MG capsule, Take by mouth., Disp: , Rfl:    potassium chloride (K-DUR,KLOR-CON) 10 MEQ tablet, Take 10 mEq by mouth daily. (Patient not taking: Reported on 06/01/2021), Disp: , Rfl:    senna (SENOKOT) 8.6 MG TABS tablet, Take 1 tablet by mouth daily as needed for mild constipation., Disp: , Rfl:    senna-docusate (SENOKOT-S) 8.6-50 MG tablet, Take 1 tablet by mouth daily. (Patient not taking: Reported on 06/01/2021), Disp: , Rfl:    simethicone (MYLICON) 80 MG chewable tablet, Chew by mouth., Disp: , Rfl:    traZODone (DESYREL) 50 MG tablet, Take 1 tablet (50 mg total) by mouth at bedtime as needed for sleep., Disp: 30 tablet, Rfl: 0   urea (CARMOL) 20 % cream, Apply topically., Disp: , Rfl:   Past Medical History: Past Medical History:  Diagnosis Date   A-fib ([place])    CAD (coronary artery disease)    Colitis    COPD (chronic obstructive pulmonary disease) (H[facility])     Emphysema lung (H[facility])    GERD (gastroesophageal reflux disease)    Hypertension    MI (myocardial infarction) ([place]) 1999    Tobacco Use: Social History   Tobacco Use  Smoking Status Former   Packs/day: 1.00   Years: 30.00   Pack years: 30.00   Types: Cigarettes   Quit date: 12/18/2001   Years since quitting: 19.4  Smokeless Tobacco Never    Labs: Recent Review Scientist, physiological     Labs for ITP Cardiac and Pulmonary Rehab Latest Ref Rng & Units 02/04/2013 09/04/2016 05/02/2018 01/05/2019   Cholestrol 0 - 200 mg/dL 185 116 - -  LDLCALC 0 - 99 mg/dL 114(H) 51 - -   HDL >40 mg/dL 38(L) 42 - -   Trlycerides <150 mg/dL 166 116 - -   Hemoglobin A1c 4.8 - 5.6 % - 6.4(H) - -   HCO3 20.0 - 28.0 mmol/L - - 36.3(H) 29.6(H)   O2SAT % - - - 81.7        Pulmonary Assessment Scores:  Pulmonary Assessment Scores     Row Name 06/13/21 1157         ADL UCSD   ADL Phase Entry     SOB Score total 41     Rest 0     Walk 1     Stairs 3     Bath 3     Dress 1     Shop 3           CAT Score     CAT Score 15           mMRC Score     mMRC Score 1             UCSD: Self-administered rating of dyspnea associated with activities of daily living (ADLs) 6-point scale (0 = "not at all" to 5 = "maximal or unable to do because of breathlessness")  Scoring Scores range from 0 to 120.  Minimally important difference is 5 units  CAT: CAT can identify the health impairment of COPD patients and is better correlated with disease progression.  CAT has a scoring range of zero to 40. The CAT score is classified into four groups of low (less than 10), medium (10 - 20), high (21-30) and very high (31-40) based on the impact level of disease on health status. A CAT score over 10 suggests significant symptoms.  A worsening CAT score could be explained by an exacerbation, poor medication adherence, poor inhaler technique, or progression of COPD or comorbid conditions.  CAT MCID is 2  points  mMRC: mMRC (Modified Medical Research Council) Dyspnea Scale is used to assess the degree of baseline functional disability in patients of respiratory disease due to dyspnea. No minimal important difference is established. A decrease in score of 1 point or greater is considered a positive change.   Pulmonary Function Assessment:  Pulmonary Function Assessment - 06/01/21 1555       Breath   Shortness of Breath Yes;Fear of Shortness of Breath;Limiting activity             Exercise Target Goals: Exercise Program Goal: Individual exercise prescription set using results from initial 6 min walk test and THRR while considering  patient's activity barriers and safety.   Exercise Prescription Goal: Initial exercise prescription builds to 30-45 minutes a day of aerobic activity, 2-3 days per week.  Home exercise guidelines will be given to patient during program as part of exercise prescription that the participant will acknowledge.  Education: Aerobic Exercise: - Group verbal and visual presentation on the components of exercise prescription. Introduces F.I.T.T principle from ACSM for exercise prescriptions.  Reviews F.I.T.T. principles of aerobic exercise including progression. Written material given at graduation. Flowsheet Row Pulmonary Rehab from 02/26/2019 in [facility] Cardiac and Pulmonary Rehab  Date 01/10/19  Educator [facility]  Instruction Review Code 1- Verbalizes Understanding       Education: Resistance Exercise: - Group verbal and visual presentation on the components of exercise prescription. Introduces F.I.T.T principle from ACSM for exercise prescriptions  Reviews F.I.T.T. principles of resistance exercise including progression. Written material given at graduation.  Education: Exercise & Equipment Safety: - Individual verbal instruction and demonstration of equipment use and safety with use of the equipment. Flowsheet Row Pulmonary Rehab from 06/01/2021 in [facility] Cardiac  and Pulmonary Rehab  Date 06/01/21  Educator [facility]  Instruction Review Code 1- Verbalizes Understanding       Education: Exercise Physiology & General Exercise Guidelines: - Group verbal and written instruction with models to review the exercise physiology of the cardiovascular system and associated critical values. Provides general exercise guidelines with specific guidelines to those with heart or lung disease.  Flowsheet Row Pulmonary Rehab from 02/26/2019 in [facility] Cardiac and Pulmonary Rehab  Date 01/08/19  Educator [facility]  Instruction Review Code 1- Verbalizes Understanding       Education: Flexibility, Balance, Mind/Body Relaxation: - Group verbal and visual presentation with interactive activity on the components of exercise prescription. Introduces F.I.T.T principle from ACSM for exercise prescriptions. Reviews F.I.T.T. principles of flexibility and balance exercise training including progression. Also discusses the mind body connection.  Reviews various relaxation techniques to help reduce and manage stress (i.e. Deep breathing, progressive muscle relaxation, and visualization). Balance handout provided to take home. Written material given at graduation. Flowsheet Row Pulmonary Rehab from 02/26/2019 in [facility] Cardiac and Pulmonary Rehab  Date 01/15/19  Educator [facility]  Instruction Review Code 1- Verbalizes Understanding       Activity Barriers & Risk Stratification:  Activity Barriers & Cardiac Risk Stratification - 06/13/21 1203       Activity Barriers & Cardiac Risk Stratification   Activity Barriers Shortness of Breath;Deconditioning;Back Problems;Muscular Weakness;Balance Concerns;Assistive Device;Other (comment)    Comments neuropathy in feet             6 Minute Walk:  6 Minute Walk     Row Name 06/13/21 1206         6 Minute Walk   Phase Initial     Distance 620 feet     Walk Time 4.5 minutes     # of Rest Breaks 3  1:57-2:15; 2:28-3:13; 4:09-4:44     MPH 1.17      METS 1.69     RPE 15     Perceived Dyspnea  3     VO2 Peak 5.93     Symptoms Yes (comment)     Comments Lower back pain 6/10, SOB     Resting HR 70 bpm     Resting BP 142/80     Resting Oxygen Saturation  94 %     Exercise Oxygen Saturation  during 6 min walk 84 %     Max Ex. HR 90 bpm     Max Ex. BP 170/80     2 Minute Post BP 130/82           Interval HR     1 Minute HR 89     2 Minute HR 88     3 Minute HR 88     4 Minute HR 90     5 Minute HR 75     6 Minute HR 85     2 Minute Post HR 68     Interval Heart Rate? Yes           Interval Oxygen     Interval Oxygen? Yes     Baseline Oxygen Saturation % 94 %     1 Minute Oxygen Saturation % 90 %     1 Minute Liters of Oxygen 3 L     2 Minute Oxygen  Saturation % 88 %     2 Minute Liters of Oxygen 3 L     3 Minute Oxygen Saturation % 87 %     3 Minute Liters of Oxygen 3 L     4 Minute Oxygen Saturation % 84 %     4 Minute Liters of Oxygen 3 L     5 Minute Oxygen Saturation % 84 %     5 Minute Liters of Oxygen 3 L     6 Minute Oxygen Saturation % 85 %     6 Minute Liters of Oxygen 3 L     2 Minute Post Oxygen Saturation % 98 %     2 Minute Post Liters of Oxygen 3 L            Oxygen Initial Assessment:  Oxygen Initial Assessment - 06/13/21 1156       Home Oxygen   Home Oxygen Device Home Concentrator;E-Tanks    Sleep Oxygen Prescription Continuous    Liters per minute 3    Home Exercise Oxygen Prescription Continuous    Liters per minute 3    Home Resting Oxygen Prescription Continuous    Liters per minute 3    Compliance with Home Oxygen Use Yes      Initial 6 min Walk   Oxygen Used Continuous    Liters per minute 3      Program Oxygen Prescription   Program Oxygen Prescription Continuous    Liters per minute 3      Intervention   Short Term Goals To learn and exhibit compliance with exercise, home and travel O2 prescription;To learn and understand importance of monitoring SPO2 with pulse  oximeter and demonstrate accurate use of the pulse oximeter.;To learn and understand importance of maintaining oxygen saturations>88%;To learn and demonstrate proper pursed lip breathing techniques or other breathing techniques. ;To learn and demonstrate proper use of respiratory medications    Long  Term Goals Exhibits compliance with exercise, home  and travel O2 prescription;Verbalizes importance of monitoring SPO2 with pulse oximeter and return demonstration;Maintenance of O2 saturations>88%;Exhibits proper breathing techniques, such as pursed lip breathing or other method taught during program session;Compliance with respiratory medication;Demonstrates proper use of MDI's             Oxygen Re-Evaluation:   Oxygen Discharge (Final Oxygen Re-Evaluation):   Initial Exercise Prescription:  Initial Exercise Prescription - 06/13/21 1200       Date of Initial Exercise RX and Referring Provider   Date 06/13/21    Referring Provider Jenne Pane, MD ([place])      Oxygen   Oxygen Continuous    Liters 3      Treadmill   MPH 0.8    Grade 0    Minutes 15    METs 1.6      NuStep   Level 1    SPM 80    Minutes 15    METs 1.6      REL-XR   Level 1    Speed 50    Minutes 15    METs 1.6      Prescription Details   Frequency (times per week) 2    Duration Progress to 30 minutes of continuous aerobic without signs/symptoms of physical distress      Intensity   THRR 40-80% of Max Heartrate 102-134    Ratings of Perceived Exertion 11-13    Perceived Dyspnea 0-4      Progression   Progression Continue to  progress workloads to maintain intensity without signs/symptoms of physical distress.      Resistance Training   Training Prescription Yes    Weight 3 lb    Reps 10-15             Perform Capillary Blood Glucose checks as needed.  Exercise Prescription Changes:   Exercise Prescription Changes     Row Name 06/13/21 1200             Response to Exercise    Blood Pressure (Admit) 142/80       Blood Pressure (Exercise) 170/80       Blood Pressure (Exit) 130/82       Heart Rate (Admit) 70 bpm       Heart Rate (Exercise) 90 bpm       Heart Rate (Exit) 68 bpm       Oxygen Saturation (Admit) 94 %       Oxygen Saturation (Exercise) 84 %       Oxygen Saturation (Exit) 98 %       Rating of Perceived Exertion (Exercise) 15       Perceived Dyspnea (Exercise) 3       Symptoms Back pain 6/10, SOB       Comments walk test results                Exercise Comments:   Exercise Goals and Review:   Exercise Goals     Row Name 06/13/21 1233             Exercise Goals   Increase Physical Activity Yes       Intervention Provide advice, education, support and counseling about physical activity/exercise needs.;Develop an individualized exercise prescription for aerobic and resistive training based on initial evaluation findings, risk stratification, comorbidities and participant's personal goals.       Expected Outcomes Short Term: Attend rehab on a regular basis to increase amount of physical activity.;Long Term: Add in home exercise to make exercise part of routine and to increase amount of physical activity.;Long Term: Exercising regularly at least 3-5 days a week.       Increase Strength and Stamina Yes       Intervention Provide advice, education, support and counseling about physical activity/exercise needs.;Develop an individualized exercise prescription for aerobic and resistive training based on initial evaluation findings, risk stratification, comorbidities and participant's personal goals.       Expected Outcomes Short Term: Increase workloads from initial exercise prescription for resistance, speed, and METs.;Short Term: Perform resistance training exercises routinely during rehab and add in resistance training at home;Long Term: Improve cardiorespiratory fitness, muscular endurance and strength as measured by increased METs and functional  capacity (6MWT)       Able to understand and use rate of perceived exertion (RPE) scale Yes       Intervention Provide education and explanation on how to use RPE scale       Expected Outcomes Short Term: Able to use RPE daily in rehab to express subjective intensity level;Long Term:  Able to use RPE to guide intensity level when exercising independently       Able to understand and use Dyspnea scale Yes       Intervention Provide education and explanation on how to use Dyspnea scale       Expected Outcomes Short Term: Able to use Dyspnea scale daily in rehab to express subjective sense of shortness of breath during exertion;Long Term: Able to use Dyspnea scale  to guide intensity level when exercising independently       Knowledge and understanding of Target Heart Rate Range (THRR) Yes       Intervention Provide education and explanation of THRR including how the numbers were predicted and where they are located for reference       Expected Outcomes Short Term: Able to state/look up THRR;Short Term: Able to use daily as guideline for intensity in rehab;Long Term: Able to use THRR to govern intensity when exercising independently       Able to check pulse independently Yes       Intervention Provide education and demonstration on how to check pulse in carotid and radial arteries.;Review the importance of being able to check your own pulse for safety during independent exercise       Expected Outcomes Short Term: Able to explain why pulse checking is important during independent exercise;Long Term: Able to check pulse independently and accurately       Understanding of Exercise Prescription Yes       Intervention Provide education, explanation, and written materials on patient's individual exercise prescription       Expected Outcomes Short Term: Able to explain program exercise prescription;Long Term: Able to explain home exercise prescription to exercise independently                Exercise  Goals Re-Evaluation :   Discharge Exercise Prescription (Final Exercise Prescription Changes):  Exercise Prescription Changes - 06/13/21 1200       Response to Exercise   Blood Pressure (Admit) 142/80    Blood Pressure (Exercise) 170/80    Blood Pressure (Exit) 130/82    Heart Rate (Admit) 70 bpm    Heart Rate (Exercise) 90 bpm    Heart Rate (Exit) 68 bpm    Oxygen Saturation (Admit) 94 %    Oxygen Saturation (Exercise) 84 %    Oxygen Saturation (Exit) 98 %    Rating of Perceived Exertion (Exercise) 15    Perceived Dyspnea (Exercise) 3    Symptoms Back pain 6/10, SOB    Comments walk test results             Nutrition:  Target Goals: Understanding of nutrition guidelines, daily intake of sodium 1500mg , cholesterol 200mg , calories 30% from fat and 7% or less from saturated fats, daily to have 5 or more servings of fruits and vegetables.  Education: All About Nutrition: -Group instruction provided by verbal, written material, interactive activities, discussions, models, and posters to present general guidelines for heart healthy nutrition including fat, fiber, MyPlate, the role of sodium in heart healthy nutrition, utilization of the nutrition label, and utilization of this knowledge for meal planning. Follow up email sent as well. Written material given at graduation. Flowsheet Row Pulmonary Rehab from 02/26/2019 in [facility] Cardiac and Pulmonary Rehab  Date 01/29/19  Educator [facility]  Instruction Review Code 1- Verbalizes Understanding       Biometrics:  Pre Biometrics - 06/13/21 1202       Pre Biometrics   Height 5' 5.75" (1.67 m)    Weight 192 lb 11.2 oz (87.4 kg)    BMI (Calculated) 31.34    Single Leg Stand 1.97 seconds              Nutrition Therapy Plan and Nutrition Goals:   Nutrition Assessments:  MEDIFICTS Score Key: ?70 Need to make dietary changes  40-70 Heart Healthy Diet ? 40 Therapeutic Level Cholesterol Diet  Flowsheet Row Pulmonary Rehab  from 06/13/2021 in [facility] Cardiac and Pulmonary Rehab  Picture Your Plate Total Score on Admission 60      Picture Your Plate Scores: <48 Unhealthy dietary pattern with much room for improvement. 41-50 Dietary pattern unlikely to meet recommendations for good health and room for improvement. 51-60 More healthful dietary pattern, with some room for improvement.  >60 Healthy dietary pattern, although there may be some specific behaviors that could be improved.   Nutrition Goals Re-Evaluation:   Nutrition Goals Discharge (Final Nutrition Goals Re-Evaluation):   Psychosocial: Target Goals: Acknowledge presence or absence of significant depression and/or stress, maximize coping skills, provide positive support system. Participant is able to verbalize types and ability to use techniques and skills needed for reducing stress and depression.   Education: Stress, Anxiety, and Depression - Group verbal and visual presentation to define topics covered.  Reviews how body is impacted by stress, anxiety, and depression.  Also discusses healthy ways to reduce stress and to treat/manage anxiety and depression.  Written material given at graduation. Flowsheet Row Pulmonary Rehab from 02/26/2019 in [facility] Cardiac and Pulmonary Rehab  Date 02/19/19  Educator [place]  Instruction Review Code 1- [employer] Understanding       Education: Sleep Hygiene -Provides group verbal and written instruction about how sleep can affect your health.  Define sleep hygiene, discuss sleep cycles and impact of sleep habits. Review good sleep hygiene tips.    Initial Review & Psychosocial Screening:  Initial Psych Review & Screening - 06/01/21 1557       Initial Review   Current issues with Current Sleep Concerns;Current Stress Concerns    Source of Stress Concerns Chronic Illness    Comments He has panic with shortness of breath at times.      Family Dynamics   Good Support System? Yes    Comments He can look to his wife  and his two sisters for support.      Barriers   Psychosocial barriers to participate in program There are no identifiable barriers or psychosocial needs.;The patient should benefit from training in stress management and relaxation.      Screening Interventions   Interventions Encouraged to exercise;To provide support and resources with identified psychosocial needs;Provide feedback about the scores to participant    Expected Outcomes Short Term goal: Utilizing psychosocial counselor, staff and physician to assist with identification of specific Stressors or current issues interfering with healing process. Setting desired goal for each stressor or current issue identified.;Long Term Goal: Stressors or current issues are controlled or eliminated.;Short Term goal: Identification and review with participant of any Quality of Life or Depression concerns found by scoring the questionnaire.;Long Term goal: The participant improves quality of Life and PHQ9 Scores as seen by post scores and/or verbalization of changes             Quality of Life Scores:  Scores of 19 and below usually indicate a poorer quality of life in these areas.  A difference of  2-3 points is a clinically meaningful difference.  A difference of 2-3 points in the total score of the Quality of Life Index has been associated with significant improvement in overall quality of life, self-image, physical symptoms, and general health in studies assessing change in quality of life.  PHQ-9: Recent Review Flowsheet Data     Depression screen [facility] 2/9 06/13/2021 02/26/2019 01/29/2019 12/23/2018 11/20/2016   Decreased Interest 0 1 0 2 1   Down, Depressed, Hopeless 0 0 0 0 1  PHQ - 2 Score 0 1 0 2 2   Altered sleeping 0 2 2 2 1    Tired, decreased energy 1 2 1 3 2    Change in appetite 0 1 2 1 1    Feeling bad or failure about yourself  0 0 0 0 0   Trouble concentrating 0 0 0 0 0   Moving slowly or fidgety/restless 0 0 0 1 0   Suicidal  thoughts 0 0 0 0 0   PHQ-9 Score 1 6 5 9 6    Difficult doing work/chores - Somewhat difficult Somewhat difficult Very difficult Very difficult      Interpretation of Total Score  Total Score Depression Severity:  1-4 = Minimal depression, 5-9 = Mild depression, 10-14 = Moderate depression, 15-19 = Moderately severe depression, 20-27 = Severe depression   Psychosocial Evaluation and Intervention:  Psychosocial Evaluation - 06/01/21 1557       Psychosocial Evaluation & Interventions   Interventions Encouraged to exercise with the program and follow exercise prescription;Stress management education;Relaxation education    Comments He can look to his wife and his two sisters for support.He has panic with shortness of breath at times.    Expected Outcomes Short: Exercise regularly to support mental health and notify staff of any changes. Long: maintain mental health and well being through teaching of rehab or prescribed medications independently.    Continue Psychosocial Services  Follow up required by staff             Psychosocial Re-Evaluation:   Psychosocial Discharge (Final Psychosocial Re-Evaluation):   Education: Education Goals: Education classes will be provided on a weekly basis, covering required topics. Participant will state understanding/return demonstration of topics presented.  Learning Barriers/Preferences:  Learning Barriers/Preferences - 06/01/21 1555       Learning Barriers/Preferences   Learning Barriers None    Learning Preferences Group Instruction;Individual Instruction;Pictoral;Skilled Demonstration;Verbal Instruction;Video;Written Material             General Pulmonary Education Topics:  Infection Prevention: - Provides verbal and written material to individual with discussion of infection control including proper hand washing and proper equipment cleaning during exercise session. Flowsheet Row Pulmonary Rehab from 06/01/2021 in [facility] Cardiac  and Pulmonary Rehab  Date 06/01/21  Educator [facility]  Instruction Review Code 1- Verbalizes Understanding       Falls Prevention: - Provides verbal and written material to individual with discussion of falls prevention and safety. Flowsheet Row Pulmonary Rehab from 06/01/2021 in [facility] Cardiac and Pulmonary Rehab  Date 06/01/21  Educator [facility]  Instruction Review Code 1- Verbalizes Understanding       Chronic Lung Disease Review: - Group verbal instruction with posters, models, PowerPoint presentations and videos,  to review new updates, new respiratory medications, new advancements in procedures and treatments. Providing information on websites and "800" numbers for continued self-education. Includes information about supplement oxygen, available portable oxygen systems, continuous and intermittent flow rates, oxygen safety, concentrators, and Medicare reimbursement for oxygen. Explanation of Pulmonary Drugs, including class, frequency, complications, importance of spacers, rinsing mouth after steroid MDI's, and proper cleaning methods for nebulizers. Review of basic lung anatomy and physiology related to function, structure, and complications of lung disease. Review of risk factors. Discussion about methods for diagnosing sleep apnea and types of masks and machines for OSA. Includes a review of the use of types of environmental controls: home humidity, furnaces, filters, dust mite/pet prevention, HEPA vacuums. Discussion about weather changes, air quality and the benefits of nasal washing.  Instruction on Warning signs, infection symptoms, calling MD promptly, preventive modes, and value of vaccinations. Review of effective airway clearance, coughing and/or vibration techniques. Emphasizing that all should Create an Action Plan. Written material given at graduation. Flowsheet Row Pulmonary Rehab from 02/26/2019 in [facility] Cardiac and Pulmonary Rehab  Date 01/24/19  Educator [facility]  Instruction Review Code 1-  Verbalizes Understanding       AED/CPR: - Group verbal and written instruction with the use of models to demonstrate the basic use of the AED with the basic ABC's of resuscitation. Flowsheet Row Pulmonary Rehab from 02/26/2019 in [facility] Cardiac and Pulmonary Rehab  Date 01/01/19  Educator [facility]  Instruction Review Code 1- Verbalizes Understanding        Anatomy and Cardiac Procedures: - Group verbal and visual presentation and models provide information about basic cardiac anatomy and function. Reviews the testing methods done to diagnose heart disease and the outcomes of the test results. Describes the treatment choices: Medical Management, Angioplasty, or Coronary Bypass Surgery for treating various heart conditions including Myocardial Infarction, Angina, Valve Disease, and Cardiac Arrhythmias.  Written material given at graduation.   Medication Safety: - Group verbal and visual instruction to review commonly prescribed medications for heart and lung disease. Reviews the medication, class of the drug, and side effects. Includes the steps to properly store meds and maintain the prescription regimen.  Written material given at graduation.   Other: -Provides group and verbal instruction on various topics (see comments)   Knowledge Questionnaire Score:  Knowledge Questionnaire Score - 06/13/21 1159       Knowledge Questionnaire Score   Pre Score 18/18              Core Components/Risk Factors/Patient Goals at Admission:  Personal Goals and Risk Factors at Admission - 06/13/21 1235       Core Components/Risk Factors/Patient Goals on Admission    Weight Management Yes;Weight Loss    Intervention Weight Management: Develop a combined nutrition and exercise program designed to reach desired caloric intake, while maintaining appropriate intake of nutrient and fiber, sodium and fats, and appropriate energy expenditure required for the weight goal.;Weight Management: Provide  education and appropriate resources to help participant work on and attain dietary goals.;Weight Management/Obesity: Establish reasonable short term and long term weight goals.    Admit Weight 192 lb (87.1 kg)    Goal Weight: Short Term 187 lb (84.8 kg)    Goal Weight: Long Term 182 lb (82.6 kg)    Expected Outcomes Short Term: Continue to assess and modify interventions until short term weight is achieved;Long Term: Adherence to nutrition and physical activity/exercise program aimed toward attainment of established weight goal;Weight Loss: Understanding of general recommendations for a balanced deficit meal plan, which promotes 1-2 lb weight loss per week and includes a negative energy balance of [phone] kcal/d;Understanding recommendations for meals to include 15-35% energy as protein, 25-35% energy from fat, 35-60% energy from carbohydrates, less than 200mg  of dietary cholesterol, 20-35 gm of total fiber daily;Understanding of distribution of calorie intake throughout the day with the consumption of 4-5 meals/snacks    Improve shortness of breath with ADL's Yes    Intervention Provide education, individualized exercise plan and daily activity instruction to help decrease symptoms of SOB with activities of daily living.    Expected Outcomes Short Term: Improve cardiorespiratory fitness to achieve a reduction of symptoms when performing ADLs;Long Term: Be able to perform more ADLs without symptoms or delay the onset of symptoms  Increase knowledge of respiratory medications and ability to use respiratory devices properly  Yes    Intervention Provide education and demonstration as needed of appropriate use of medications, inhalers, and oxygen therapy.    Expected Outcomes Short Term: Achieves understanding of medications use. Understands that oxygen is a medication prescribed by physician. Demonstrates appropriate use of inhaler and oxygen therapy.;Long Term: Maintain appropriate use of medications,  inhalers, and oxygen therapy.    Diabetes Yes    Intervention Provide education about signs/symptoms and action to take for hypo/hyperglycemia.;Provide education about proper nutrition, including hydration, and aerobic/resistive exercise prescription along with prescribed medications to achieve blood glucose in normal ranges: Fasting glucose 65-99 mg/dL    Expected Outcomes Short Term: Participant verbalizes understanding of the signs/symptoms and immediate care of hyper/hypoglycemia, proper foot care and importance of medication, aerobic/resistive exercise and nutrition plan for blood glucose control.;Long Term: Attainment of HbA1C < 7%.    Hypertension Yes    Intervention Provide education on lifestyle modifcations including regular physical activity/exercise, weight management, moderate sodium restriction and increased consumption of fresh fruit, vegetables, and low fat dairy, alcohol moderation, and smoking cessation.;Monitor prescription use compliance.    Expected Outcomes Short Term: Continued assessment and intervention until BP is < 140/73mm HG in hypertensive participants. < 130/66mm HG in hypertensive participants with diabetes, heart failure or chronic kidney disease.;Long Term: Maintenance of blood pressure at goal levels.    Lipids Yes    Intervention Provide education and support for participant on nutrition & aerobic/resistive exercise along with prescribed medications to achieve LDL 70mg , HDL >40mg .    Expected Outcomes Short Term: Participant states understanding of desired cholesterol values and is compliant with medications prescribed. Participant is following exercise prescription and nutrition guidelines.;Long Term: Cholesterol controlled with medications as prescribed, with individualized exercise RX and with personalized nutrition plan. Value goals: LDL < 70mg , HDL > 40 mg.             Education:Diabetes - Individual verbal and written instruction to review signs/symptoms  of diabetes, desired ranges of glucose level fasting, after meals and with exercise. Acknowledge that pre and post exercise glucose checks will be done for 3 sessions at entry of program. Flowsheet Row Pulmonary Rehab from 06/01/2021 in [facility] Cardiac and Pulmonary Rehab  Date 06/01/21  Educator [facility]  Instruction Review Code 1- Verbalizes Understanding       Know Your Numbers and Heart Failure: - Group verbal and visual instruction to discuss disease risk factors for cardiac and pulmonary disease and treatment options.  Reviews associated critical values for Overweight/Obesity, Hypertension, Cholesterol, and Diabetes.  Discusses basics of heart failure: signs/symptoms and treatments.  Introduces Heart Failure Zone chart for action plan for heart failure.  Written material given at graduation.   Core Components/Risk Factors/Patient Goals Review:    Core Components/Risk Factors/Patient Goals at Discharge (Final Review):    ITP Comments:  ITP Comments     Row Name 06/01/21 1552 06/13/21 1152         ITP Comments Virtual Visit completed. Patient informed on EP and RD appointment and 6 Minute walk test. Patient also informed of patient health questionnaires on My Chart. Patient Verbalizes understanding. Visit diagnosis can be found in [facility] [employer]. [place] is [place]. Completed 6MWT and gym orientation. Initial ITP created and sent for review to Dr.Fuad [facility], Medical Director.               Comments: Initial ITP

## 2021-06-13 NOTE — Patient Instructions (Signed)
Patient Instructions  Patient Details  Name: Darius Norris MRN: [id number] Date of Birth: April 14, 1951 Referring Provider:  Lawrence Marseilles, MD  Below are your personal goals for exercise, nutrition, and risk factors. Our goal is to help you stay on track towards obtaining and maintaining these goals. We will be discussing your progress on these goals with you throughout the program.  Initial Exercise Prescription:  Initial Exercise Prescription - 06/13/21 1200       Date of Initial Exercise RX and Referring Provider   Date 06/13/21    Referring Provider Jenne Pane, [place] ([place])      Oxygen   Oxygen Continuous    Liters 3      Treadmill   MPH 0.8    Grade 0    Minutes 15    METs 1.6      NuStep   Level 1    SPM 80    Minutes 15    METs 1.6      REL-XR   Level 1    Speed 50    Minutes 15    METs 1.6      Prescription Details   Frequency (times per week) 2    Duration Progress to 30 minutes of continuous aerobic without signs/symptoms of physical distress      Intensity   THRR 40-80% of Max Heartrate 102-134    Ratings of Perceived Exertion 11-13    Perceived Dyspnea 0-4      Progression   Progression Continue to progress workloads to maintain intensity without signs/symptoms of physical distress.      Resistance Training   Training Prescription Yes    Weight 3 lb    Reps 10-15             Exercise Goals: Frequency: Be able to perform aerobic exercise two to three times per week in program working toward 2-5 days per week of home exercise.  Intensity: Work with a perceived exertion of 11 (fairly light) - 15 (hard) while following your exercise prescription.  We will make changes to your prescription with you as you progress through the program.   Duration: Be able to do 30 to 45 minutes of continuous aerobic exercise in addition to a 5 minute warm-up and a 5 minute cool-down routine.   Nutrition Goals: Your personal nutrition goals will be established  when you do your nutrition analysis with the dietician.  The following are general nutrition guidelines to follow: Cholesterol < 200mg /day Sodium < 1500mg /day Fiber: Men over 50 yrs - 30 grams per day  Personal Goals:  Personal Goals and Risk Factors at Admission - 06/13/21 1235       Core Components/Risk Factors/Patient Goals on Admission    Weight Management Yes;Weight Loss    Intervention Weight Management: Develop a combined nutrition and exercise program designed to reach desired caloric intake, while maintaining appropriate intake of nutrient and fiber, sodium and fats, and appropriate energy expenditure required for the weight goal.;Weight Management: Provide education and appropriate resources to help participant work on and attain dietary goals.;Weight Management/Obesity: Establish reasonable short term and long term weight goals.    Admit Weight 192 lb (87.1 kg)    Goal Weight: Short Term 187 lb (84.8 kg)    Goal Weight: Long Term 182 lb (82.6 kg)    Expected Outcomes Short Term: Continue to assess and modify interventions until short term weight is achieved;Long Term: Adherence to nutrition and physical activity/exercise program aimed toward attainment of  established weight goal;Weight Loss: Understanding of general recommendations for a balanced deficit meal plan, which promotes 1-2 lb weight loss per week and includes a negative energy balance of [phone] kcal/d;Understanding recommendations for meals to include 15-35% energy as protein, 25-35% energy from fat, 35-60% energy from carbohydrates, less than 200mg  of dietary cholesterol, 20-35 gm of total fiber daily;Understanding of distribution of calorie intake throughout the day with the consumption of 4-5 meals/snacks    Improve shortness of breath with ADL's Yes    Intervention Provide education, individualized exercise plan and daily activity instruction to help decrease symptoms of SOB with activities of daily living.    Expected  Outcomes Short Term: Improve cardiorespiratory fitness to achieve a reduction of symptoms when performing ADLs;Long Term: Be able to perform more ADLs without symptoms or delay the onset of symptoms    Increase knowledge of respiratory medications and ability to use respiratory devices properly  Yes    Intervention Provide education and demonstration as needed of appropriate use of medications, inhalers, and oxygen therapy.    Expected Outcomes Short Term: Achieves understanding of medications use. Understands that oxygen is a medication prescribed by physician. Demonstrates appropriate use of inhaler and oxygen therapy.;Long Term: Maintain appropriate use of medications, inhalers, and oxygen therapy.    Diabetes Yes    Intervention Provide education about signs/symptoms and action to take for hypo/hyperglycemia.;Provide education about proper nutrition, including hydration, and aerobic/resistive exercise prescription along with prescribed medications to achieve blood glucose in normal ranges: Fasting glucose 65-99 mg/dL    Expected Outcomes Short Term: Participant verbalizes understanding of the signs/symptoms and immediate care of hyper/hypoglycemia, proper foot care and importance of medication, aerobic/resistive exercise and nutrition plan for blood glucose control.;Long Term: Attainment of HbA1C < 7%.    Hypertension Yes    Intervention Provide education on lifestyle modifcations including regular physical activity/exercise, weight management, moderate sodium restriction and increased consumption of fresh fruit, vegetables, and low fat dairy, alcohol moderation, and smoking cessation.;Monitor prescription use compliance.    Expected Outcomes Short Term: Continued assessment and intervention until BP is < 140/34mm HG in hypertensive participants. < 130/50mm HG in hypertensive participants with diabetes, heart failure or chronic kidney disease.;Long Term: Maintenance of blood pressure at goal levels.     Lipids Yes    Intervention Provide education and support for participant on nutrition & aerobic/resistive exercise along with prescribed medications to achieve LDL 70mg , HDL >40mg .    Expected Outcomes Short Term: Participant states understanding of desired cholesterol values and is compliant with medications prescribed. Participant is following exercise prescription and nutrition guidelines.;Long Term: Cholesterol controlled with medications as prescribed, with individualized exercise RX and with personalized nutrition plan. Value goals: LDL < 70mg , HDL > 40 mg.             Tobacco Use Initial Evaluation: Social History   Tobacco Use  Smoking Status Former   Packs/day: 1.00   Years: 30.00   Pack years: 30.00   Types: Cigarettes   Quit date: 12/18/2001   Years since quitting: 19.4  Smokeless Tobacco Never    Exercise Goals and Review:  Exercise Goals     Row Name 06/13/21 1233             Exercise Goals   Increase Physical Activity Yes       Intervention Provide advice, education, support and counseling about physical activity/exercise needs.;Develop an individualized exercise prescription for aerobic and resistive training based on initial evaluation findings, risk stratification, comorbidities  and participant's personal goals.       Expected Outcomes Short Term: Attend rehab on a regular basis to increase amount of physical activity.;Long Term: Add in home exercise to make exercise part of routine and to increase amount of physical activity.;Long Term: Exercising regularly at least 3-5 days a week.       Increase Strength and Stamina Yes       Intervention Provide advice, education, support and counseling about physical activity/exercise needs.;Develop an individualized exercise prescription for aerobic and resistive training based on initial evaluation findings, risk stratification, comorbidities and participant's personal goals.       Expected Outcomes Short Term: Increase  workloads from initial exercise prescription for resistance, speed, and METs.;Short Term: Perform resistance training exercises routinely during rehab and add in resistance training at home;Long Term: Improve cardiorespiratory fitness, muscular endurance and strength as measured by increased METs and functional capacity (6MWT)       Able to understand and use rate of perceived exertion (RPE) scale Yes       Intervention Provide education and explanation on how to use RPE scale       Expected Outcomes Short Term: Able to use RPE daily in rehab to express subjective intensity level;Long Term:  Able to use RPE to guide intensity level when exercising independently       Able to understand and use Dyspnea scale Yes       Intervention Provide education and explanation on how to use Dyspnea scale       Expected Outcomes Short Term: Able to use Dyspnea scale daily in rehab to express subjective sense of shortness of breath during exertion;Long Term: Able to use Dyspnea scale to guide intensity level when exercising independently       Knowledge and understanding of Target Heart Rate Range (THRR) Yes       Intervention Provide education and explanation of THRR including how the numbers were predicted and where they are located for reference       Expected Outcomes Short Term: Able to state/look up THRR;Short Term: Able to use daily as guideline for intensity in rehab;Long Term: Able to use THRR to govern intensity when exercising independently       Able to check pulse independently Yes       Intervention Provide education and demonstration on how to check pulse in carotid and radial arteries.;Review the importance of being able to check your own pulse for safety during independent exercise       Expected Outcomes Short Term: Able to explain why pulse checking is important during independent exercise;Long Term: Able to check pulse independently and accurately       Understanding of Exercise Prescription Yes        Intervention Provide education, explanation, and written materials on patient's individual exercise prescription       Expected Outcomes Short Term: Able to explain program exercise prescription;Long Term: Able to explain home exercise prescription to exercise independently                Copy of goals given to participant.

## 2021-06-23 ENCOUNTER — Other Ambulatory Visit: Payer: Self-pay

## 2021-06-23 ENCOUNTER — Encounter: Payer: No Typology Code available for payment source | Attending: Anesthesiology | Admitting: *Deleted

## 2021-06-23 DIAGNOSIS — R0602 Shortness of breath: Secondary | ICD-10-CM | POA: Diagnosis present

## 2021-06-23 DIAGNOSIS — J449 Chronic obstructive pulmonary disease, unspecified: Secondary | ICD-10-CM | POA: Insufficient documentation

## 2021-06-23 LAB — GLUCOSE, CAPILLARY
Glucose-Capillary: 149 mg/dL — ABNORMAL HIGH (ref 70–99)
Glucose-Capillary: 92 mg/dL (ref 70–99)

## 2021-06-23 NOTE — Progress Notes (Signed)
Daily Session Note  Patient Details  Name: Darius Norris MRN: [id number] Date of Birth: 1951-11-15 Referring Provider:   Flowsheet Row Pulmonary Rehab from 06/13/2021 in [facility]  Referring Provider Jenne Pane, MD ([place])       Encounter Date: 06/23/2021  Check In:  Session Check In - 06/23/21 1130       Check-In   Supervising physician immediately available to respond to emergencies See telemetry face sheet for immediately available ER MD    Location [facility]-Cardiac & Pulmonary Rehab    Staff Present Renita Papa, RN Sherryl Barters, MPA, RN;Melissa Winfield, RDN, LDN;Susanne Bice, RN, BSN, CCRP    Virtual Visit No    Medication changes reported     No    Fall or balance concerns reported    No    Warm-up and Cool-down Performed on first and last piece of equipment    Resistance Training Performed Yes    VAD Patient? No    PAD/SET Patient? No      Pain Assessment   Currently in Pain? No/denies                Social History   Tobacco Use  Smoking Status Former   Packs/day: 1.00   Years: 30.00   Pack years: 30.00   Types: Cigarettes   Quit date: 12/18/2001   Years since quitting: 19.5  Smokeless Tobacco Never    Goals Met:  Independence with exercise equipment Exercise tolerated well No report of cardiac concerns or symptoms Strength training completed today  Goals Unmet:  Not Applicable  Comments: First full day of exercise!  Patient was oriented to gym and equipment including functions, settings, policies, and procedures.  Patient's individual exercise prescription and treatment plan were reviewed.  All starting workloads were established based on the results of the 6 minute walk test done at initial orientation visit.  The plan for exercise progression was also introduced and progression will be customized based on patient's performance and goals.     Dr. Emily Filbert is Medical Director for [place].   Dr. Ottie Glazier is Medical Director for [facility] Pulmonary Rehabilitation.

## 2021-06-28 ENCOUNTER — Other Ambulatory Visit: Payer: Self-pay

## 2021-06-28 DIAGNOSIS — J449 Chronic obstructive pulmonary disease, unspecified: Secondary | ICD-10-CM

## 2021-06-28 DIAGNOSIS — R0602 Shortness of breath: Secondary | ICD-10-CM

## 2021-06-28 LAB — GLUCOSE, CAPILLARY
Glucose-Capillary: 150 mg/dL — ABNORMAL HIGH (ref 70–99)
Glucose-Capillary: 87 mg/dL (ref 70–99)

## 2021-06-28 NOTE — Progress Notes (Signed)
Daily Session Note  Patient Details  Name: Darius Norris MRN: [id number] Date of Birth: October 04, 1951 Referring Provider:   Flowsheet Row Pulmonary Rehab from 06/13/2021 in [facility]  Referring Provider Jenne Pane, MD ([place])       Encounter Date: 06/28/2021  Check In:  Session Check In - 06/28/21 1116       Check-In   Supervising physician immediately available to respond to emergencies See telemetry face sheet for immediately available ER MD    Location [facility] Pul[facility]    Staff Present Birdie Sons, MPA, RN;Melissa [place], RDN, LDN;Jessica [place], MA, RCEP, CCRP, Marylynn Pearson, MS, ASCM CEP, Exercise Physiologist    Virtual Visit No    Medication changes reported     Yes    Comments increased lisonopril to 35m    Fall or balance concerns reported    No    Tobacco Cessation No Change    Warm-up and Cool-down Performed on first and last piece of equipment    Resistance Training Performed Yes    VAD Patient? No    PAD/SET Patient? No      Pain Assessment   Currently in Pain? No/denies                Social History   Tobacco Use  Smoking Status Former   Packs/day: 1.00   Years: 30.00   Pack years: 30.00   Types: Cigarettes   Quit date: 12/18/2001   Years since quitting: 19.5  Smokeless Tobacco Never    Goals Met:  Independence with exercise equipment Exercise tolerated well No report of cardiac concerns or symptoms Strength training completed today  Goals Unmet:  Not Applicable  Comments: Pt able to follow exercise prescription today without complaint.  Will continue to monitor for progression.    Dr. MEmily Filbertis Medical Director for [facility]  Dr. FOttie Glazieris Medical Director for [facility].

## 2021-06-29 ENCOUNTER — Encounter: Payer: Self-pay | Admitting: *Deleted

## 2021-06-29 DIAGNOSIS — J449 Chronic obstructive pulmonary disease, unspecified: Secondary | ICD-10-CM

## 2021-06-29 NOTE — Progress Notes (Signed)
Pulmonary Individual Treatment Plan  Patient Details  Name: RAYNALD ROUILLARD MRN: [id number] Date of Birth: 27-Feb-1951 Referring Provider:   Flowsheet Row Pulmonary Rehab from 06/13/2021 in [facility] Cardiac and Pulmonary Rehab  Referring Provider Jenne Pane, MD ([place])       Initial Encounter Date:  Flowsheet Row Pulmonary Rehab from 06/13/2021 in [facility] Cardiac and Pulmonary Rehab  Date 06/13/21       Visit Diagnosis: Chronic obstructive pulmonary disease, unspecified COPD type ([place])  Patient's Home Medications on Admission:  Current Outpatient Medications:    acetaminophen (TYLENOL) 325 MG tablet, Take 650 mg by mouth every 6 (six) hours as needed.  (Patient not taking: Reported on 06/01/2021), Disp: , Rfl:    acetaminophen (TYLENOL) 325 MG tablet, Take by mouth., Disp: , Rfl:    albuterol (VENTOLIN HFA) 108 (90 Base) MCG/ACT inhaler, Inhale into the lungs., Disp: , Rfl:    Albuterol Sulfate, sensor, (PROAIR DIGIHALER) 108 (90 Base) MCG/ACT AEPB, Inhale into the lungs., Disp: , Rfl:    amitriptyline (ELAVIL) 10 MG tablet, Take 15 mg by mouth at bedtime. , Disp: , Rfl:    amitriptyline (ELAVIL) 10 MG tablet, Take 2 tablets by mouth at bedtime., Disp: , Rfl:    aspirin EC 81 MG EC tablet, Take 1 tablet (81 mg total) by mouth daily., Disp: 180 tablet, Rfl: 0   atorvastatin (LIPITOR) 80 MG tablet, Take 1 tablet (80 mg total) by mouth at bedtime. (Patient not taking: Reported on 06/01/2021), Disp: 30 tablet, Rfl: 0   atorvastatin (LIPITOR) 80 MG tablet, Take by mouth., Disp: , Rfl:    budesonide-formoterol (SYMBICORT) 160-4.5 MCG/ACT inhaler, Inhale 2 puffs into the lungs 2 (two) times daily., Disp: , Rfl:    busPIRone (BUSPAR) 10 MG tablet, Take 10 mg by mouth 2 (two) times daily., Disp: , Rfl:    busPIRone (BUSPAR) 10 MG tablet, Take by mouth. (Patient not taking: Reported on 06/01/2021), Disp: , Rfl:    clobetasol (TEMOVATE) 0.05 % external solution, Apply topically., Disp: , Rfl:    clobetasol  cream (TEMOVATE) 1.44 %, Apply 1 application topically daily as needed. For itching on psoriasis areas, Disp: , Rfl:    clobetasol cream (TEMOVATE) 3.15 %, Apply 1 application topically daily as needed. For itching on psoriasis areas, Disp: , Rfl:    dabigatran (PRADAXA) 150 MG CAPS capsule, Take 150 mg by mouth 2 (two) times daily., Disp: , Rfl:    dabigatran (PRADAXA) 150 MG CAPS capsule, Take by mouth., Disp: , Rfl:    diltiazem (CARDIZEM CD) 180 MG 24 hr capsule, Take 1 capsule (180 mg total) by mouth daily. (Patient not taking: Reported on 06/01/2021), Disp: 90 capsule, Rfl: 0   diltiazem (TIAZAC) 180 MG 24 hr capsule, Take 1 capsule by mouth daily., Disp: , Rfl:    fluticasone (FLONASE) 50 MCG/ACT nasal spray, Place 2 sprays into both nostrils daily., Disp: , Rfl:    folic acid (FOLVITE) 1 MG tablet, Take by mouth., Disp: , Rfl:    gabapentin (NEURONTIN) 100 MG capsule, Take 100 mg by mouth at bedtime. (Patient not taking: Reported on 06/01/2021), Disp: , Rfl:    gabapentin (NEURONTIN) 300 MG capsule, Take by mouth., Disp: , Rfl:    guaifenesin (HUMIBID E) 400 MG TABS tablet, Take by mouth., Disp: , Rfl:    hydrocortisone 2.5 % cream, Apply topically., Disp: , Rfl:    hydrOXYzine (ATARAX/VISTARIL) 25 MG tablet, Take by mouth., Disp: , Rfl:    Ipratropium-Albuterol (COMBIVENT)  20-100 MCG/ACT AERS respimat, Inhale 1 puff into the lungs every 6 (six) hours as needed for wheezing or shortness of breath. (Patient not taking: Reported on 06/01/2021), Disp: , Rfl:    isosorbide mononitrate (IMDUR) 60 MG 24 hr tablet, Take 60 mg by mouth daily. (Patient not taking: Reported on 06/01/2021), Disp: , Rfl:    isosorbide mononitrate (IMDUR) 60 MG 24 hr tablet, Take by mouth., Disp: , Rfl:    lidocaine (XYLOCAINE) 5 % ointment, Apply topically., Disp: , Rfl:    lisinopril (PRINIVIL,ZESTRIL) 40 MG tablet, Take 20 mg by mouth daily.  (Patient not taking: Reported on 06/01/2021), Disp: , Rfl:    lisinopril  (ZESTRIL) 40 MG tablet, Take by mouth., Disp: , Rfl:    loratadine (CLARITIN) 10 MG tablet, Take 10 mg by mouth daily as needed for allergies. , Disp: , Rfl:    loratadine (CLARITIN) 10 MG tablet, Take 1 tablet by mouth daily. (Patient not taking: Reported on 06/01/2021), Disp: , Rfl:    Melatonin 3 MG CAPS, Take 6 mg by mouth., Disp: , Rfl:    metFORMIN (GLUCOPHAGE) 500 MG tablet, Take by mouth. (Patient not taking: Reported on 06/01/2021), Disp: , Rfl:    metFORMIN (GLUCOPHAGE-XR) 500 MG 24 hr tablet, Take by mouth., Disp: , Rfl:    methotrexate (RHEUMATREX) 2.5 MG tablet, Take by mouth., Disp: , Rfl:    metoprolol tartrate (LOPRESSOR) 100 MG tablet, Take 1 tablet (100 mg total) by mouth 2 (two) times daily. (Patient not taking: Reported on 06/01/2021), Disp: 60 tablet, Rfl: 0   metoprolol tartrate (LOPRESSOR) 50 MG tablet, Take by mouth., Disp: , Rfl:    mometasone (ASMANEX) 220 MCG/INH inhaler, Take by mouth., Disp: , Rfl:    omeprazole (PRILOSEC) 20 MG capsule, Take 20 mg by mouth daily. , Disp: , Rfl:    omeprazole (PRILOSEC) 20 MG capsule, Take by mouth., Disp: , Rfl:    potassium chloride (K-DUR,KLOR-CON) 10 MEQ tablet, Take 10 mEq by mouth daily. (Patient not taking: Reported on 06/01/2021), Disp: , Rfl:    senna (SENOKOT) 8.6 MG TABS tablet, Take 1 tablet by mouth daily as needed for mild constipation., Disp: , Rfl:    senna-docusate (SENOKOT-S) 8.6-50 MG tablet, Take 1 tablet by mouth daily. (Patient not taking: Reported on 06/01/2021), Disp: , Rfl:    simethicone (MYLICON) 80 MG chewable tablet, Chew by mouth., Disp: , Rfl:    traZODone (DESYREL) 50 MG tablet, Take 1 tablet (50 mg total) by mouth at bedtime as needed for sleep., Disp: 30 tablet, Rfl: 0   urea (CARMOL) 20 % cream, Apply topically., Disp: , Rfl:   Past Medical History: Past Medical History:  Diagnosis Date   A-fib ([place])    CAD (coronary artery disease)    Colitis    COPD (chronic obstructive pulmonary disease) (HCC)     Emphysema lung (HCC)    GERD (gastroesophageal reflux disease)    Hypertension    MI (myocardial infarction) ([place]) 1999    Tobacco Use: Social History   Tobacco Use  Smoking Status Former   Packs/day: 1.00   Years: 30.00   Pack years: 30.00   Types: Cigarettes   Quit date: 12/18/2001   Years since quitting: 19.5  Smokeless Tobacco Never    Labs: Recent Review Scientist, physiological     Labs for ITP Cardiac and Pulmonary Rehab Latest Ref Rng & Units 02/04/2013 09/04/2016 05/02/2018 01/05/2019   Cholestrol 0 - 200 mg/dL 185 116 - -  LDLCALC 0 - 99 mg/dL 114(H) 51 - -   HDL >40 mg/dL 38(L) 42 - -   Trlycerides <150 mg/dL 166 116 - -   Hemoglobin A1c 4.8 - 5.6 % - 6.4(H) - -   HCO3 20.0 - 28.0 mmol/L - - 36.3(H) 29.6(H)   O2SAT % - - - 81.7        Pulmonary Assessment Scores:  Pulmonary Assessment Scores     Row Name 06/13/21 1157         ADL UCSD   ADL Phase Entry     SOB Score total 41     Rest 0     Walk 1     Stairs 3     Bath 3     Dress 1     Shop 3           CAT Score     CAT Score 15           mMRC Score     mMRC Score 1             UCSD: Self-administered rating of dyspnea associated with activities of daily living (ADLs) 6-point scale (0 = "not at all" to 5 = "maximal or unable to do because of breathlessness")  Scoring Scores range from 0 to 120.  Minimally important difference is 5 units  CAT: CAT can identify the health impairment of COPD patients and is better correlated with disease progression.  CAT has a scoring range of zero to 40. The CAT score is classified into four groups of low (less than 10), medium (10 - 20), high (21-30) and very high (31-40) based on the impact level of disease on health status. A CAT score over 10 suggests significant symptoms.  A worsening CAT score could be explained by an exacerbation, poor medication adherence, poor inhaler technique, or progression of COPD or comorbid conditions.  CAT MCID is 2  points  mMRC: mMRC (Modified Medical Research Council) Dyspnea Scale is used to assess the degree of baseline functional disability in patients of respiratory disease due to dyspnea. No minimal important difference is established. A decrease in score of 1 point or greater is considered a positive change.   Pulmonary Function Assessment:  Pulmonary Function Assessment - 06/01/21 1555       Breath   Shortness of Breath Yes;Fear of Shortness of Breath;Limiting activity             Exercise Target Goals: Exercise Program Goal: Individual exercise prescription set using results from initial 6 min walk test and THRR while considering  patient's activity barriers and safety.   Exercise Prescription Goal: Initial exercise prescription builds to 30-45 minutes a day of aerobic activity, 2-3 days per week.  Home exercise guidelines will be given to patient during program as part of exercise prescription that the participant will acknowledge.  Education: Aerobic Exercise: - Group verbal and visual presentation on the components of exercise prescription. Introduces F.I.T.T principle from ACSM for exercise prescriptions.  Reviews F.I.T.T. principles of aerobic exercise including progression. Written material given at graduation. Flowsheet Row Pulmonary Rehab from 02/26/2019 in [facility] Cardiac and Pulmonary Rehab  Date 01/10/19  Educator [facility]  Instruction Review Code 1- Verbalizes Understanding       Education: Resistance Exercise: - Group verbal and visual presentation on the components of exercise prescription. Introduces F.I.T.T principle from ACSM for exercise prescriptions  Reviews F.I.T.T. principles of resistance exercise including progression. Written material given at graduation.  Education: Exercise & Equipment Safety: - Individual verbal instruction and demonstration of equipment use and safety with use of the equipment. Flowsheet Row Pulmonary Rehab from 06/01/2021 in [facility] Cardiac  and Pulmonary Rehab  Date 06/01/21  Educator [facility]  Instruction Review Code 1- Verbalizes Understanding       Education: Exercise Physiology & General Exercise Guidelines: - Group verbal and written instruction with models to review the exercise physiology of the cardiovascular system and associated critical values. Provides general exercise guidelines with specific guidelines to those with heart or lung disease.  Flowsheet Row Pulmonary Rehab from 02/26/2019 in [facility] Cardiac and Pulmonary Rehab  Date 01/08/19  Educator [facility]  Instruction Review Code 1- Verbalizes Understanding       Education: Flexibility, Balance, Mind/Body Relaxation: - Group verbal and visual presentation with interactive activity on the components of exercise prescription. Introduces F.I.T.T principle from ACSM for exercise prescriptions. Reviews F.I.T.T. principles of flexibility and balance exercise training including progression. Also discusses the mind body connection.  Reviews various relaxation techniques to help reduce and manage stress (i.e. Deep breathing, progressive muscle relaxation, and visualization). Balance handout provided to take home. Written material given at graduation. Flowsheet Row Pulmonary Rehab from 02/26/2019 in [facility] Cardiac and Pulmonary Rehab  Date 01/15/19  Educator [facility]  Instruction Review Code 1- Verbalizes Understanding       Activity Barriers & Risk Stratification:  Activity Barriers & Cardiac Risk Stratification - 06/13/21 1203       Activity Barriers & Cardiac Risk Stratification   Activity Barriers Shortness of Breath;Deconditioning;Back Problems;Muscular Weakness;Balance Concerns;Assistive Device;Other (comment)    Comments neuropathy in feet             6 Minute Walk:  6 Minute Walk     Row Name 06/13/21 1206         6 Minute Walk   Phase Initial     Distance 620 feet     Walk Time 4.5 minutes     # of Rest Breaks 3  1:57-2:15; 2:28-3:13; 4:09-4:44     MPH 1.17      METS 1.69     RPE 15     Perceived Dyspnea  3     VO2 Peak 5.93     Symptoms Yes (comment)     Comments Lower back pain 6/10, SOB     Resting HR 70 bpm     Resting BP 142/80     Resting Oxygen Saturation  94 %     Exercise Oxygen Saturation  during 6 min walk 84 %     Max Ex. HR 90 bpm     Max Ex. BP 170/80     2 Minute Post BP 130/82           Interval HR     1 Minute HR 89     2 Minute HR 88     3 Minute HR 88     4 Minute HR 90     5 Minute HR 75     6 Minute HR 85     2 Minute Post HR 68     Interval Heart Rate? Yes           Interval Oxygen     Interval Oxygen? Yes     Baseline Oxygen Saturation % 94 %     1 Minute Oxygen Saturation % 90 %     1 Minute Liters of Oxygen 3 L     2 Minute Oxygen  Saturation % 88 %     2 Minute Liters of Oxygen 3 L     3 Minute Oxygen Saturation % 87 %     3 Minute Liters of Oxygen 3 L     4 Minute Oxygen Saturation % 84 %     4 Minute Liters of Oxygen 3 L     5 Minute Oxygen Saturation % 84 %     5 Minute Liters of Oxygen 3 L     6 Minute Oxygen Saturation % 85 %     6 Minute Liters of Oxygen 3 L     2 Minute Post Oxygen Saturation % 98 %     2 Minute Post Liters of Oxygen 3 L            Oxygen Initial Assessment:  Oxygen Initial Assessment - 06/13/21 1156       Home Oxygen   Home Oxygen Device Home Concentrator;E-Tanks    Sleep Oxygen Prescription Continuous    Liters per minute 3    Home Exercise Oxygen Prescription Continuous    Liters per minute 3    Home Resting Oxygen Prescription Continuous    Liters per minute 3    Compliance with Home Oxygen Use Yes      Initial 6 min Walk   Oxygen Used Continuous    Liters per minute 3      Program Oxygen Prescription   Program Oxygen Prescription Continuous    Liters per minute 3      Intervention   Short Term Goals To learn and exhibit compliance with exercise, home and travel O2 prescription;To learn and understand importance of monitoring SPO2 with pulse  oximeter and demonstrate accurate use of the pulse oximeter.;To learn and understand importance of maintaining oxygen saturations>88%;To learn and demonstrate proper pursed lip breathing techniques or other breathing techniques. ;To learn and demonstrate proper use of respiratory medications    Long  Term Goals Exhibits compliance with exercise, home  and travel O2 prescription;Verbalizes importance of monitoring SPO2 with pulse oximeter and return demonstration;Maintenance of O2 saturations>88%;Exhibits proper breathing techniques, such as pursed lip breathing or other method taught during program session;Compliance with respiratory medication;Demonstrates proper use of MDI's             Oxygen Re-Evaluation:  Oxygen Re-Evaluation     Row Name 06/23/21 1133             Program Oxygen Prescription   Program Oxygen Prescription Continuous       Liters per minute 3               Home Oxygen     Home Oxygen Device Home Concentrator;E-Tanks       Sleep Oxygen Prescription Continuous       Liters per minute 3       Home Exercise Oxygen Prescription Continuous       Liters per minute 3       Home Resting Oxygen Prescription Continuous       Liters per minute 3       Compliance with Home Oxygen Use Yes               Goals/Expected Outcomes     Short Term Goals To learn and exhibit compliance with exercise, home and travel O2 prescription;To learn and understand importance of monitoring SPO2 with pulse oximeter and demonstrate accurate use of the pulse oximeter.;To learn and understand importance of maintaining oxygen saturations>88%;To learn and demonstrate  proper pursed lip breathing techniques or other breathing techniques. ;To learn and demonstrate proper use of respiratory medications       Long  Term Goals Exhibits compliance with exercise, home  and travel O2 prescription;Verbalizes importance of monitoring SPO2 with pulse oximeter and return demonstration;Maintenance of O2  saturations>88%;Exhibits proper breathing techniques, such as pursed lip breathing or other method taught during program session;Compliance with respiratory medication;Demonstrates proper use of MDI's       Comments Reviewed PLB technique with pt.  Talked about how it works and it's importance in maintaining their exercise saturations.       Goals/Expected Outcomes Short: Become more profiecient at using PLB.   Long: Become independent at using PLB.               Oxygen Discharge (Final Oxygen Re-Evaluation):  Oxygen Re-Evaluation - 06/23/21 1133       Program Oxygen Prescription   Program Oxygen Prescription Continuous    Liters per minute 3      Home Oxygen   Home Oxygen Device Home Concentrator;E-Tanks    Sleep Oxygen Prescription Continuous    Liters per minute 3    Home Exercise Oxygen Prescription Continuous    Liters per minute 3    Home Resting Oxygen Prescription Continuous    Liters per minute 3    Compliance with Home Oxygen Use Yes      Goals/Expected Outcomes   Short Term Goals To learn and exhibit compliance with exercise, home and travel O2 prescription;To learn and understand importance of monitoring SPO2 with pulse oximeter and demonstrate accurate use of the pulse oximeter.;To learn and understand importance of maintaining oxygen saturations>88%;To learn and demonstrate proper pursed lip breathing techniques or other breathing techniques. ;To learn and demonstrate proper use of respiratory medications    Long  Term Goals Exhibits compliance with exercise, home  and travel O2 prescription;Verbalizes importance of monitoring SPO2 with pulse oximeter and return demonstration;Maintenance of O2 saturations>88%;Exhibits proper breathing techniques, such as pursed lip breathing or other method taught during program session;Compliance with respiratory medication;Demonstrates proper use of MDI's    Comments Reviewed PLB technique with pt.  Talked about how it works and it's  importance in maintaining their exercise saturations.    Goals/Expected Outcomes Short: Become more profiecient at using PLB.   Long: Become independent at using PLB.             Initial Exercise Prescription:  Initial Exercise Prescription - 06/13/21 1200       Date of Initial Exercise RX and Referring Provider   Date 06/13/21    Referring Provider Jenne Pane, MD ([place])      Oxygen   Oxygen Continuous    Liters 3      Treadmill   MPH 0.8    Grade 0    Minutes 15    METs 1.6      NuStep   Level 1    SPM 80    Minutes 15    METs 1.6      REL-XR   Level 1    Speed 50    Minutes 15    METs 1.6      Prescription Details   Frequency (times per week) 2    Duration Progress to 30 minutes of continuous aerobic without signs/symptoms of physical distress      Intensity   THRR 40-80% of Max Heartrate 102-134    Ratings of Perceived Exertion 11-13    Perceived Dyspnea  0-4      Progression   Progression Continue to progress workloads to maintain intensity without signs/symptoms of physical distress.      Resistance Training   Training Prescription Yes    Weight 3 lb    Reps 10-15             Perform Capillary Blood Glucose checks as needed.  Exercise Prescription Changes:   Exercise Prescription Changes     Row Name 06/13/21 1200             Response to Exercise   Blood Pressure (Admit) 142/80       Blood Pressure (Exercise) 170/80       Blood Pressure (Exit) 130/82       Heart Rate (Admit) 70 bpm       Heart Rate (Exercise) 90 bpm       Heart Rate (Exit) 68 bpm       Oxygen Saturation (Admit) 94 %       Oxygen Saturation (Exercise) 84 %       Oxygen Saturation (Exit) 98 %       Rating of Perceived Exertion (Exercise) 15       Perceived Dyspnea (Exercise) 3       Symptoms Back pain 6/10, SOB       Comments walk test results                Exercise Comments:   Exercise Goals and Review:   Exercise Goals     Row Name 06/13/21 1233              Exercise Goals   Increase Physical Activity Yes       Intervention Provide advice, education, support and counseling about physical activity/exercise needs.;Develop an individualized exercise prescription for aerobic and resistive training based on initial evaluation findings, risk stratification, comorbidities and participant's personal goals.       Expected Outcomes Short Term: Attend rehab on a regular basis to increase amount of physical activity.;Long Term: Add in home exercise to make exercise part of routine and to increase amount of physical activity.;Long Term: Exercising regularly at least 3-5 days a week.       Increase Strength and Stamina Yes       Intervention Provide advice, education, support and counseling about physical activity/exercise needs.;Develop an individualized exercise prescription for aerobic and resistive training based on initial evaluation findings, risk stratification, comorbidities and participant's personal goals.       Expected Outcomes Short Term: Increase workloads from initial exercise prescription for resistance, speed, and METs.;Short Term: Perform resistance training exercises routinely during rehab and add in resistance training at home;Long Term: Improve cardiorespiratory fitness, muscular endurance and strength as measured by increased METs and functional capacity (6MWT)       Able to understand and use rate of perceived exertion (RPE) scale Yes       Intervention Provide education and explanation on how to use RPE scale       Expected Outcomes Short Term: Able to use RPE daily in rehab to express subjective intensity level;Long Term:  Able to use RPE to guide intensity level when exercising independently       Able to understand and use Dyspnea scale Yes       Intervention Provide education and explanation on how to use Dyspnea scale       Expected Outcomes Short Term: Able to use Dyspnea scale daily in rehab to express subjective sense of  shortness of breath during exertion;Long Term: Able to use Dyspnea scale to guide intensity level when exercising independently       Knowledge and understanding of Target Heart Rate Range (THRR) Yes       Intervention Provide education and explanation of THRR including how the numbers were predicted and where they are located for reference       Expected Outcomes Short Term: Able to state/look up THRR;Short Term: Able to use daily as guideline for intensity in rehab;Long Term: Able to use THRR to govern intensity when exercising independently       Able to check pulse independently Yes       Intervention Provide education and demonstration on how to check pulse in carotid and radial arteries.;Review the importance of being able to check your own pulse for safety during independent exercise       Expected Outcomes Short Term: Able to explain why pulse checking is important during independent exercise;Long Term: Able to check pulse independently and accurately       Understanding of Exercise Prescription Yes       Intervention Provide education, explanation, and written materials on patient's individual exercise prescription       Expected Outcomes Short Term: Able to explain program exercise prescription;Long Term: Able to explain home exercise prescription to exercise independently                Exercise Goals Re-Evaluation :  Exercise Goals Re-Evaluation     [place] Name 06/23/21 1132             Exercise Goal Re-Evaluation   Exercise Goals Review Increase Physical Activity;Able to understand and use rate of perceived exertion (RPE) scale;Knowledge and understanding of Target Heart Rate Range (THRR);Understanding of Exercise Prescription;Increase Strength and Stamina;Able to understand and use Dyspnea scale;Able to check pulse independently       Comments Reviewed RPE and dyspnea scales, THR and program prescription with pt today.  Pt voiced understanding and was given a copy of goals to  take home.       Expected Outcomes Short: Use RPE daily to regulate intensity. Long: Follow program prescription in THR.                Discharge Exercise Prescription (Final Exercise Prescription Changes):  Exercise Prescription Changes - 06/13/21 1200       Response to Exercise   Blood Pressure (Admit) 142/80    Blood Pressure (Exercise) 170/80    Blood Pressure (Exit) 130/82    Heart Rate (Admit) 70 bpm    Heart Rate (Exercise) 90 bpm    Heart Rate (Exit) 68 bpm    Oxygen Saturation (Admit) 94 %    Oxygen Saturation (Exercise) 84 %    Oxygen Saturation (Exit) 98 %    Rating of Perceived Exertion (Exercise) 15    Perceived Dyspnea (Exercise) 3    Symptoms Back pain 6/10, SOB    Comments walk test results             Nutrition:  Target Goals: Understanding of nutrition guidelines, daily intake of sodium 1500mg , cholesterol 200mg , calories 30% from fat and 7% or less from saturated fats, daily to have 5 or more servings of fruits and vegetables.  Education: All About Nutrition: -Group instruction provided by verbal, written material, interactive activities, discussions, models, and posters to present general guidelines for heart healthy nutrition including fat, fiber, MyPlate, the role of sodium in heart healthy nutrition, utilization of the nutrition  label, and utilization of this knowledge for meal planning. Follow up email sent as well. Written material given at graduation. Flowsheet Row Pulmonary Rehab from 02/26/2019 in [facility] Cardiac and Pulmonary Rehab  Date 01/29/19  Educator [facility]  Instruction Review Code 1- Verbalizes Understanding       Biometrics:  Pre Biometrics - 06/13/21 1202       Pre Biometrics   Height 5' 5.75" (1.67 m)    Weight 192 lb 11.2 oz (87.4 kg)    BMI (Calculated) 31.34    Single Leg Stand 1.97 seconds              Nutrition Therapy Plan and Nutrition Goals:   Nutrition Assessments:  MEDIFICTS Score Key: ?70 Need to make  dietary changes  40-70 Heart Healthy Diet ? 40 Therapeutic Level Cholesterol Diet  Flowsheet Row Pulmonary Rehab from 06/13/2021 in [facility] Cardiac and Pulmonary Rehab  Picture Your Plate Total Score on Admission 60      Picture Your Plate Scores: <60 Unhealthy dietary pattern with much room for improvement. 41-50 Dietary pattern unlikely to meet recommendations for good health and room for improvement. 51-60 More healthful dietary pattern, with some room for improvement.  >60 Healthy dietary pattern, although there may be some specific behaviors that could be improved.   Nutrition Goals Re-Evaluation:   Nutrition Goals Discharge (Final Nutrition Goals Re-Evaluation):   Psychosocial: Target Goals: Acknowledge presence or absence of significant depression and/or stress, maximize coping skills, provide positive support system. Participant is able to verbalize types and ability to use techniques and skills needed for reducing stress and depression.   Education: Stress, Anxiety, and Depression - Group verbal and visual presentation to define topics covered.  Reviews how body is impacted by stress, anxiety, and depression.  Also discusses healthy ways to reduce stress and to treat/manage anxiety and depression.  Written material given at graduation. Flowsheet Row Pulmonary Rehab from 02/26/2019 in [facility] Cardiac and Pulmonary Rehab  Date 02/19/19  Educator [facility]  Instruction Review Code 1- [employer] Understanding       Education: Sleep Hygiene -Provides group verbal and written instruction about how sleep can affect your health.  Define sleep hygiene, discuss sleep cycles and impact of sleep habits. Review good sleep hygiene tips.    Initial Review & Psychosocial Screening:  Initial Psych Review & Screening - 06/01/21 1557       Initial Review   Current issues with Current Sleep Concerns;Current Stress Concerns    Source of Stress Concerns Chronic Illness    Comments He has panic with  shortness of breath at times.      Family Dynamics   Good Support System? Yes    Comments He can look to his wife and his two sisters for support.      Barriers   Psychosocial barriers to participate in program There are no identifiable barriers or psychosocial needs.;The patient should benefit from training in stress management and relaxation.      Screening Interventions   Interventions Encouraged to exercise;To provide support and resources with identified psychosocial needs;Provide feedback about the scores to participant    Expected Outcomes Short Term goal: Utilizing psychosocial counselor, staff and physician to assist with identification of specific Stressors or current issues interfering with healing process. Setting desired goal for each stressor or current issue identified.;Long Term Goal: Stressors or current issues are controlled or eliminated.;Short Term goal: Identification and review with participant of any Quality of Life or Depression concerns found by scoring  the questionnaire.;Long Term goal: The participant improves quality of Life and PHQ9 Scores as seen by post scores and/or verbalization of changes             Quality of Life Scores:  Scores of 19 and below usually indicate a poorer quality of life in these areas.  A difference of  2-3 points is a clinically meaningful difference.  A difference of 2-3 points in the total score of the Quality of Life Index has been associated with significant improvement in overall quality of life, self-image, physical symptoms, and general health in studies assessing change in quality of life.  PHQ-9: Recent Review Flowsheet Data     Depression screen [facility] 2/9 06/13/2021 02/26/2019 01/29/2019 12/23/2018 11/20/2016   Decreased Interest 0 1 0 2 1   Down, Depressed, Hopeless 0 0 0 0 1   PHQ - 2 Score 0 1 0 2 2   Altered sleeping 0 2 2 2 1    Tired, decreased energy 1 2 1 3 2    Change in appetite 0 1 2 1 1    Feeling bad or failure about  yourself  0 0 0 0 0   Trouble concentrating 0 0 0 0 0   Moving slowly or fidgety/restless 0 0 0 1 0   Suicidal thoughts 0 0 0 0 0   PHQ-9 Score 1 6 5 9 6    Difficult doing work/chores - Somewhat difficult Somewhat difficult Very difficult Very difficult      Interpretation of Total Score  Total Score Depression Severity:  1-4 = Minimal depression, 5-9 = Mild depression, 10-14 = Moderate depression, 15-19 = Moderately severe depression, 20-27 = Severe depression   Psychosocial Evaluation and Intervention:  Psychosocial Evaluation - 06/01/21 1557       Psychosocial Evaluation & Interventions   Interventions Encouraged to exercise with the program and follow exercise prescription;Stress management education;Relaxation education    Comments He can look to his wife and his two sisters for support.He has panic with shortness of breath at times.    Expected Outcomes Short: Exercise regularly to support mental health and notify staff of any changes. Long: maintain mental health and well being through teaching of rehab or prescribed medications independently.    Continue Psychosocial Services  Follow up required by staff             Psychosocial Re-Evaluation:   Psychosocial Discharge (Final Psychosocial Re-Evaluation):   Education: Education Goals: Education classes will be provided on a weekly basis, covering required topics. Participant will state understanding/return demonstration of topics presented.  Learning Barriers/Preferences:  Learning Barriers/Preferences - 06/01/21 1555       Learning Barriers/Preferences   Learning Barriers None    Learning Preferences Group Instruction;Individual Instruction;Pictoral;Skilled Demonstration;Verbal Instruction;Video;Written Material             General Pulmonary Education Topics:  Infection Prevention: - Provides verbal and written material to individual with discussion of infection control including proper hand washing and  proper equipment cleaning during exercise session. Flowsheet Row Pulmonary Rehab from 06/01/2021 in [facility] Cardiac and Pulmonary Rehab  Date 06/01/21  Educator [facility]  Instruction Review Code 1- Verbalizes Understanding       Falls Prevention: - Provides verbal and written material to individual with discussion of falls prevention and safety. Flowsheet Row Pulmonary Rehab from 06/01/2021 in [facility] Cardiac and Pulmonary Rehab  Date 06/01/21  Educator [facility]  Instruction Review Code 1- Verbalizes Understanding       Chronic Lung Disease  Review: - Group verbal instruction with posters, models, PowerPoint presentations and videos,  to review new updates, new respiratory medications, new advancements in procedures and treatments. Providing information on websites and "800" numbers for continued self-education. Includes information about supplement oxygen, available portable oxygen systems, continuous and intermittent flow rates, oxygen safety, concentrators, and Medicare reimbursement for oxygen. Explanation of Pulmonary Drugs, including class, frequency, complications, importance of spacers, rinsing mouth after steroid MDI's, and proper cleaning methods for nebulizers. Review of basic lung anatomy and physiology related to function, structure, and complications of lung disease. Review of risk factors. Discussion about methods for diagnosing sleep apnea and types of masks and machines for OSA. Includes a review of the use of types of environmental controls: home humidity, furnaces, filters, dust mite/pet prevention, HEPA vacuums. Discussion about weather changes, air quality and the benefits of nasal washing. Instruction on Warning signs, infection symptoms, calling MD promptly, preventive modes, and value of vaccinations. Review of effective airway clearance, coughing and/or vibration techniques. Emphasizing that all should Create an Action Plan. Written material given at graduation. Flowsheet Row Pulmonary Rehab  from 02/26/2019 in [facility] Cardiac and Pulmonary Rehab  Date 01/24/19  Educator [facility]  Instruction Review Code 1- Verbalizes Understanding       AED/CPR: - Group verbal and written instruction with the use of models to demonstrate the basic use of the AED with the basic ABC's of resuscitation. Flowsheet Row Pulmonary Rehab from 02/26/2019 in [facility] Cardiac and Pulmonary Rehab  Date 01/01/19  Educator [facility]  Instruction Review Code 1- Verbalizes Understanding        Anatomy and Cardiac Procedures: - Group verbal and visual presentation and models provide information about basic cardiac anatomy and function. Reviews the testing methods done to diagnose heart disease and the outcomes of the test results. Describes the treatment choices: Medical Management, Angioplasty, or Coronary Bypass Surgery for treating various heart conditions including Myocardial Infarction, Angina, Valve Disease, and Cardiac Arrhythmias.  Written material given at graduation.   Medication Safety: - Group verbal and visual instruction to review commonly prescribed medications for heart and lung disease. Reviews the medication, class of the drug, and side effects. Includes the steps to properly store meds and maintain the prescription regimen.  Written material given at graduation.   Other: -Provides group and verbal instruction on various topics (see comments)   Knowledge Questionnaire Score:  Knowledge Questionnaire Score - 06/13/21 1159       Knowledge Questionnaire Score   Pre Score 18/18              Core Components/Risk Factors/Patient Goals at Admission:  Personal Goals and Risk Factors at Admission - 06/13/21 1235       Core Components/Risk Factors/Patient Goals on Admission    Weight Management Yes;Weight Loss    Intervention Weight Management: Develop a combined nutrition and exercise program designed to reach desired caloric intake, while maintaining appropriate intake of nutrient and fiber, sodium  and fats, and appropriate energy expenditure required for the weight goal.;Weight Management: Provide education and appropriate resources to help participant work on and attain dietary goals.;Weight Management/Obesity: Establish reasonable short term and long term weight goals.    Admit Weight 192 lb (87.1 kg)    Goal Weight: Short Term 187 lb (84.8 kg)    Goal Weight: Long Term 182 lb (82.6 kg)    Expected Outcomes Short Term: Continue to assess and modify interventions until short term weight is achieved;Long Term: Adherence to nutrition and physical activity/exercise program  aimed toward attainment of established weight goal;Weight Loss: Understanding of general recommendations for a balanced deficit meal plan, which promotes 1-2 lb weight loss per week and includes a negative energy balance of [phone] kcal/d;Understanding recommendations for meals to include 15-35% energy as protein, 25-35% energy from fat, 35-60% energy from carbohydrates, less than 200mg  of dietary cholesterol, 20-35 gm of total fiber daily;Understanding of distribution of calorie intake throughout the day with the consumption of 4-5 meals/snacks    Improve shortness of breath with ADL's Yes    Intervention Provide education, individualized exercise plan and daily activity instruction to help decrease symptoms of SOB with activities of daily living.    Expected Outcomes Short Term: Improve cardiorespiratory fitness to achieve a reduction of symptoms when performing ADLs;Long Term: Be able to perform more ADLs without symptoms or delay the onset of symptoms    Increase knowledge of respiratory medications and ability to use respiratory devices properly  Yes    Intervention Provide education and demonstration as needed of appropriate use of medications, inhalers, and oxygen therapy.    Expected Outcomes Short Term: Achieves understanding of medications use. Understands that oxygen is a medication prescribed by physician. Demonstrates  appropriate use of inhaler and oxygen therapy.;Long Term: Maintain appropriate use of medications, inhalers, and oxygen therapy.    Diabetes Yes    Intervention Provide education about signs/symptoms and action to take for hypo/hyperglycemia.;Provide education about proper nutrition, including hydration, and aerobic/resistive exercise prescription along with prescribed medications to achieve blood glucose in normal ranges: Fasting glucose 65-99 mg/dL    Expected Outcomes Short Term: Participant verbalizes understanding of the signs/symptoms and immediate care of hyper/hypoglycemia, proper foot care and importance of medication, aerobic/resistive exercise and nutrition plan for blood glucose control.;Long Term: Attainment of HbA1C < 7%.    Hypertension Yes    Intervention Provide education on lifestyle modifcations including regular physical activity/exercise, weight management, moderate sodium restriction and increased consumption of fresh fruit, vegetables, and low fat dairy, alcohol moderation, and smoking cessation.;Monitor prescription use compliance.    Expected Outcomes Short Term: Continued assessment and intervention until BP is < 140/50mm HG in hypertensive participants. < 130/91mm HG in hypertensive participants with diabetes, heart failure or chronic kidney disease.;Long Term: Maintenance of blood pressure at goal levels.    Lipids Yes    Intervention Provide education and support for participant on nutrition & aerobic/resistive exercise along with prescribed medications to achieve LDL 70mg , HDL >40mg .    Expected Outcomes Short Term: Participant states understanding of desired cholesterol values and is compliant with medications prescribed. Participant is following exercise prescription and nutrition guidelines.;Long Term: Cholesterol controlled with medications as prescribed, with individualized exercise RX and with personalized nutrition plan. Value goals: LDL < 70mg , HDL > 40 mg.              Education:Diabetes - Individual verbal and written instruction to review signs/symptoms of diabetes, desired ranges of glucose level fasting, after meals and with exercise. Acknowledge that pre and post exercise glucose checks will be done for 3 sessions at entry of program. Flowsheet Row Pulmonary Rehab from 06/01/2021 in [facility] Cardiac and Pulmonary Rehab  Date 06/01/21  Educator [facility]  Instruction Review Code 1- Verbalizes Understanding       Know Your Numbers and Heart Failure: - Group verbal and visual instruction to discuss disease risk factors for cardiac and pulmonary disease and treatment options.  Reviews associated critical values for Overweight/Obesity, Hypertension, Cholesterol, and Diabetes.  Discusses basics of heart failure:  signs/symptoms and treatments.  Introduces Heart Failure Zone chart for action plan for heart failure.  Written material given at graduation.   Core Components/Risk Factors/Patient Goals Review:    Core Components/Risk Factors/Patient Goals at Discharge (Final Review):    ITP Comments:  ITP Comments     Row Name 06/01/21 1552 06/13/21 1152 06/23/21 1131 06/29/21 1136     ITP Comments Virtual Visit completed. Patient informed on EP and RD appointment and 6 Minute walk test. Patient also informed of patient health questionnaires on My Chart. Patient Verbalizes understanding. Visit diagnosis can be found in [facility] [employer]. [facility] is [facility]. Completed 6MWT and gym orientation. Initial ITP created and sent for review to Dr.[facility], Medical Director. First full day of exercise!  Patient was oriented to gym and equipment including functions, settings, policies, and procedures.  Patient's individual exercise prescription and treatment plan were reviewed.  All starting workloads were established based on the results of the 6 minute walk test done at initial orientation visit.  The plan for exercise progression was also introduced and progression will be  customized based on patient's performance and goals. 30 Day review completed. Medical Director ITP review done, changes made as directed, and signed approval by Medical Director.    New to program             Comments:

## 2021-07-05 ENCOUNTER — Other Ambulatory Visit: Payer: Self-pay

## 2021-07-05 DIAGNOSIS — R0602 Shortness of breath: Secondary | ICD-10-CM

## 2021-07-05 DIAGNOSIS — J449 Chronic obstructive pulmonary disease, unspecified: Secondary | ICD-10-CM

## 2021-07-05 NOTE — Progress Notes (Signed)
Daily Session Note  Patient Details  Name: Darius Norris MRN: [id number] Date of Birth: 11-Jan-1951 Referring Provider:   Flowsheet Row Pulmonary Rehab from 06/13/2021 in [facility] Cardiac and Pulmonary Rehab  Referring Provider Jenne Pane, MD ([place])       Encounter Date: 07/05/2021  Check In:  Session Check In - 07/05/21 1111       Check-In   Supervising physician immediately available to respond to emergencies See telemetry face sheet for immediately available ER MD    Location [facility]-Cardiac & Pulmonary Rehab    Staff Present Birdie Sons, MPA, RN;Melissa [place], RDN, LDN;Laureen [place]ark, BS, RRT, CPFT    Virtual Visit No    Medication changes reported     No    Fall or balance concerns reported    No    Tobacco Cessation No Change    Warm-up and Cool-down Performed on first and last piece of equipment    Resistance Training Performed Yes    VAD Patient? No    PAD/SET Patient? No      Pain Assessment   Currently in Pain? No/denies                Social History   Tobacco Use  Smoking Status Former   Packs/day: 1.00   Years: 30.00   Pack years: 30.00   Types: Cigarettes   Quit date: 12/18/2001   Years since quitting: 19.5  Smokeless Tobacco Never    Goals Met:  Independence with exercise equipment Exercise tolerated well No report of cardiac concerns or symptoms Strength training completed today  Goals Unmet:  Not Applicable  Comments: Pt able to follow exercise prescription today without complaint.  Will continue to monitor for progression.    Dr. Emily Filbert is Medical Director for [place].  Dr. Ottie Glazier is Medical Director for [facility] Pulmonary Rehabilitation.

## 2021-07-07 ENCOUNTER — Other Ambulatory Visit: Payer: Self-pay

## 2021-07-07 DIAGNOSIS — J449 Chronic obstructive pulmonary disease, unspecified: Secondary | ICD-10-CM

## 2021-07-07 DIAGNOSIS — R0602 Shortness of breath: Secondary | ICD-10-CM

## 2021-07-07 NOTE — Progress Notes (Signed)
Daily Session Note  Patient Details  Name: Darius Norris MRN: [id number] Date of Birth: [birth year] Referring Provider:   Flowsheet Row Pulmonary Rehab from 06/13/2021 in [facility] Cardiac and Pulmonary Rehab  Referring Provider Jenne Pane, MD ([place])       Encounter Date: 07/07/2021  Check In:  Session Check In - 07/07/21 1047       Check-In   Supervising physician immediately available to respond to emergencies See telemetry face sheet for immediately available ER MD    Location [facility]-Cardiac & Pulmonary Rehab    Staff Present Birdie Sons, MPA, RN;Melissa [place], RDN, LDN;Joseph Grant, RCP,RRT,BSRT    Virtual Visit No    Medication changes reported     No    Fall or balance concerns reported    No    Tobacco Cessation No Change    Warm-up and Cool-down Performed on first and last piece of equipment    Resistance Training Performed Yes    VAD Patient? No    PAD/SET Patient? No      Pain Assessment   Currently in Pain? No/denies                Social History   Tobacco Use  Smoking Status Former   Packs/day: 1.00   Years: 30.00   Pack years: 30.00   Types: Cigarettes   Quit date: 12/18/2001   Years since quitting: 19.5  Smokeless Tobacco Never    Goals Met:  Independence with exercise equipment Exercise tolerated well No report of cardiac concerns or symptoms Strength training completed today  Goals Unmet:  Not Applicable  Comments: Pt able to follow exercise prescription today without complaint.  Will continue to monitor for progression.  Reviewed home exercise with pt today.  Pt plans to walking and treadmill for exercise.  We also talked about adding in some staff videos. Reviewed THR, pulse, RPE, sign and symptoms, pulse oximetery and when to call 911 or MD.  Also discussed weather considerations and indoor options.  Pt voiced understanding.   Dr. Emily Filbert is Medical Director for [facility] [facility].  Dr. Ottie Glazier is Medical  Director for [facility] Pulmonary Rehabilitation.

## 2021-07-12 ENCOUNTER — Other Ambulatory Visit: Payer: Self-pay

## 2021-07-12 DIAGNOSIS — R0602 Shortness of breath: Secondary | ICD-10-CM

## 2021-07-12 DIAGNOSIS — J449 Chronic obstructive pulmonary disease, unspecified: Secondary | ICD-10-CM | POA: Diagnosis not present

## 2021-07-12 NOTE — Progress Notes (Signed)
Daily Session Note  Patient Details  Name: SCHAWN BYAS MRN: [id number] Date of Birth: 1951-01-23 Referring Provider:   Flowsheet Row Pulmonary Rehab from 06/13/2021 in [facility] Cardiac and Pulmonary Rehab  Referring Provider Jenne Pane, MD ([place])       Encounter Date: 07/12/2021  Check In:  Session Check In - 07/12/21 1025       Check-In   Supervising physician immediately available to respond to emergencies See telemetry face sheet for immediately available ER MD    Location [facility]-Cardiac & Pulmonary Rehab    Staff Present Birdie Sons, MPA, RN;Jessica [place], MA, RCEP, CCRP, CCET;Amanda Sommer, BA, ACSM CEP, Exercise Physiologist    Virtual Visit No    Medication changes reported     No    Fall or balance concerns reported    No    Tobacco Cessation No Change    Warm-up and Cool-down Performed on first and last piece of equipment    Resistance Training Performed Yes    VAD Patient? No    PAD/SET Patient? No      Pain Assessment   Currently in Pain? No/denies                Social History   Tobacco Use  Smoking Status Former   Packs/day: 1.00   Years: 30.00   Pack years: 30.00   Types: Cigarettes   Quit date: 12/18/2001   Years since quitting: 19.5  Smokeless Tobacco Never    Goals Met:  Independence with exercise equipment Exercise tolerated well No report of cardiac concerns or symptoms Strength training completed today  Goals Unmet:  Not Applicable  Comments: Pt able to follow exercise prescription today without complaint.  Will continue to monitor for progression.    Dr. Emily Filbert is Medical Director for [place].  Dr. Ottie Glazier is Medical Director for [facility] Pulmonary Rehabilitation.

## 2021-07-14 ENCOUNTER — Encounter: Payer: No Typology Code available for payment source | Admitting: *Deleted

## 2021-07-14 ENCOUNTER — Other Ambulatory Visit: Payer: Self-pay

## 2021-07-14 DIAGNOSIS — J449 Chronic obstructive pulmonary disease, unspecified: Secondary | ICD-10-CM

## 2021-07-14 DIAGNOSIS — R0602 Shortness of breath: Secondary | ICD-10-CM

## 2021-07-14 NOTE — Progress Notes (Signed)
Daily Session Note  Patient Details  Name: Darius Norris MRN: [id number] Date of Birth: Apr 12, 1951 Referring Provider:   Flowsheet Row Pulmonary Rehab from 06/13/2021 in [facility]  Referring Provider Jenne Pane, MD ([place])       Encounter Date: 07/14/2021  Check In:  Session Check In - 07/14/21 1043       Check-In   Supervising physician immediately available to respond to emergencies See telemetry face sheet for immediately available ER MD    Location [facility]-Cardiac & Pulmonary Rehab    Staff Present Renita Papa, RN BSN;Joseph Tessie Fass, RCP,RRT,BSRT;Melissa [place], [place], LDN    Virtual Visit No    Medication changes reported     No    Fall or balance concerns reported    No    Warm-up and Cool-down Performed on first and last piece of equipment    Resistance Training Performed Yes    VAD Patient? No    PAD/SET Patient? No      Pain Assessment   Currently in Pain? No/denies                Social History   Tobacco Use  Smoking Status Former   Packs/day: 1.00   Years: 30.00   Pack years: 30.00   Types: Cigarettes   Quit date: 12/18/2001   Years since quitting: 19.5  Smokeless Tobacco Never    Goals Met:  Independence with exercise equipment Exercise tolerated well No report of cardiac concerns or symptoms Strength training completed today  Goals Unmet:  Not Applicable  Comments: Pt able to follow exercise prescription today without complaint.  Will continue to monitor for progression.    Dr. Emily Filbert is Medical Director for [place].  Dr. Ottie Glazier is Medical Director for [facility] Pulmonary Rehabilitation.

## 2021-07-19 ENCOUNTER — Encounter: Payer: No Typology Code available for payment source | Attending: Anesthesiology | Admitting: *Deleted

## 2021-07-19 ENCOUNTER — Other Ambulatory Visit: Payer: Self-pay

## 2021-07-19 DIAGNOSIS — R0602 Shortness of breath: Secondary | ICD-10-CM

## 2021-07-19 DIAGNOSIS — J449 Chronic obstructive pulmonary disease, unspecified: Secondary | ICD-10-CM | POA: Diagnosis present

## 2021-07-19 NOTE — Progress Notes (Signed)
Daily Session Note  Patient Details  Name: Darius Norris MRN: [id number] Date of Birth: 01/16/1951 Referring Provider:   Flowsheet Row Pulmonary Rehab from 06/13/2021 in [facility]  Referring Provider Mary Koethe, [place] ([place])       Encounter Date: 07/19/2021  Check In:  Session Check In - 07/19/21 1137       Check-In   Supervising physician immediately available to respond to emergencies See telemetry face sheet for immediately available ER MD    Location [facility]    Staff Present  , RN, BSN, CCRP;Kelly Bollinger, MPA, RN;Amanda Sommer, BA, ACSM CEP, Exercise Physiologist;Melissa [place], RDN, LDN    Virtual Visit No    Medication changes reported     No    Fall or balance concerns reported    No    Warm-up and Cool-down Performed on first and last piece of equipment    Resistance Training Performed Yes    VAD Patient? No    PAD/SET Patient? No      Pain Assessment   Currently in Pain? No/denies                Social History   Tobacco Use  Smoking Status Former   Packs/day: 1.00   Years: 30.00   Pack years: 30.00   Types: Cigarettes   Quit date: 12/18/2001   Years since quitting: 19.5  Smokeless Tobacco Never    Goals Met:  Proper associated with RPD/PD & O2 Sat Independence with exercise equipment Exercise tolerated well No report of cardiac concerns or symptoms  Goals Unmet:  Not Applicable  Comments: Pt able to follow exercise prescription today without complaint.  Will continue to monitor for progression.    Dr. Mark Miller is Medical Director for [facility].  Dr. Fuad Aleskerov is Medical Director for [name] Pulmonary Rehabilitation. 

## 2021-07-21 ENCOUNTER — Other Ambulatory Visit: Payer: Self-pay

## 2021-07-21 ENCOUNTER — Encounter: Payer: No Typology Code available for payment source | Admitting: *Deleted

## 2021-07-21 DIAGNOSIS — J449 Chronic obstructive pulmonary disease, unspecified: Secondary | ICD-10-CM

## 2021-07-21 DIAGNOSIS — R0602 Shortness of breath: Secondary | ICD-10-CM

## 2021-07-21 NOTE — Progress Notes (Signed)
Daily Session Note  Patient Details  Name: Darius Norris MRN: [id number] Date of Birth: 08-26-1951 Referring Provider:   Flowsheet Row Pulmonary Rehab from 06/13/2021 in [facility] Cardiac and Pulmonary Rehab  Referring Provider Jenne Pane, MD ([place])       Encounter Date: 07/21/2021  Check In:  Session Check In - 07/21/21 1036       Check-In   Supervising physician immediately available to respond to emergencies See telemetry face sheet for immediately available ER MD    Location [facility]    Staff Present Renita Papa, RN BSN;Joseph [place], RCP,RRT,BSRT;Jessica [place], [place], RCEP, CCRP, CCET;Melissa [place], [place], LDN    Virtual Visit No    Medication changes reported     No    Fall or balance concerns reported    No    Warm-up and Cool-down Performed on first and last piece of equipment    Resistance Training Performed Yes    VAD Patient? No    PAD/SET Patient? No      Pain Assessment   Currently in Pain? No/denies                Social History   Tobacco Use  Smoking Status Former   Packs/day: 1.00   Years: 30.00   Pack years: 30.00   Types: Cigarettes   Quit date: 12/18/2001   Years since quitting: 19.6  Smokeless Tobacco Never    Goals Met:  Independence with exercise equipment Exercise tolerated well No report of cardiac concerns or symptoms Strength training completed today  Goals Unmet:  Not Applicable  Comments: Pt able to follow exercise prescription today without complaint.  Will continue to monitor for progression.    Dr. Emily Filbert is Medical Director for [place].  Dr. Ottie Glazier is Medical Director for [facility] Pulmonary Rehabilitation.

## 2021-07-26 ENCOUNTER — Other Ambulatory Visit: Payer: Self-pay

## 2021-07-26 DIAGNOSIS — J449 Chronic obstructive pulmonary disease, unspecified: Secondary | ICD-10-CM | POA: Diagnosis not present

## 2021-07-26 DIAGNOSIS — R0602 Shortness of breath: Secondary | ICD-10-CM

## 2021-07-26 NOTE — Progress Notes (Signed)
Daily Session Note  Patient Details  Name: Darius Norris MRN: [id number] Date of Birth: 1951/12/18 Referring Provider:   Flowsheet Row Pulmonary Rehab from 06/13/2021 in [facility] Card[facility] and Pulmonary Rehab  Referring Provider Jenne Pane, MD ([place])       Encounter Date: 07/26/2021  Check In:  Session Check In - 07/26/21 1106       Check-In   Supervising physician immediately available to respond to emergencies See telemetry face sheet for immediately available ER MD    Location [facility] Pul[facility]    Staff Present Birdie Sons, MPA, RN;Melissa [place], RDN, LDN;Jessica [place], MA, RCEP, CCRP, CCET;Amanda Sommer, BA, ACSM CEP, Exercise Physiologist    Virtual Visit No    Medication changes reported     No    Fall or balance concerns reported    No    Tobacco Cessation No Change    Warm-up and Cool-down Performed on first and last piece of equipment    Resistance Training Performed Yes    VAD Patient? No    PAD/SET Patient? No      Pain Assessment   Currently in Pain? No/denies                Social History   Tobacco Use  Smoking Status Former   Packs/day: 1.00   Years: 30.00   Pack years: 30.00   Types: Cigarettes   Quit date: 12/18/2001   Years since quitting: 19.6  Smokeless Tobacco Never    Goals Met:  Independence with exercise equipment Exercise tolerated well No report of cardiac concerns or symptoms Strength training completed today  Goals Unmet:  Not Applicable  Comments: Pt able to follow exercise prescription today without complaint.  Will continue to monitor for progression.    Dr. Emily Filbert is Medical Director for [place].  Dr. Ottie Glazier is Medical Director for [facility] Pulmonary Rehabilitation.

## 2021-07-27 ENCOUNTER — Encounter: Payer: Self-pay | Admitting: *Deleted

## 2021-07-27 DIAGNOSIS — J449 Chronic obstructive pulmonary disease, unspecified: Secondary | ICD-10-CM

## 2021-07-27 NOTE — Progress Notes (Signed)
Pulmonary Individual Treatment Plan  Patient Details  Name: Darius Norris MRN: [id number] Date of Birth: 27-Feb-1951 Referring Provider:   Flowsheet Row Pulmonary Rehab from 06/13/2021 in [facility] Cardiac and Pulmonary Rehab  Referring Provider Jenne Pane, MD ([place])       Initial Encounter Date:  Flowsheet Row Pulmonary Rehab from 06/13/2021 in [facility] Cardiac and Pulmonary Rehab  Date 06/13/21       Visit Diagnosis: Chronic obstructive pulmonary disease, unspecified COPD type ([place])  Patient's Home Medications on Admission:  Current Outpatient Medications:    acetaminophen (TYLENOL) 325 MG tablet, Take 650 mg by mouth every 6 (six) hours as needed.  (Patient not taking: Reported on 06/01/2021), Disp: , Rfl:    acetaminophen (TYLENOL) 325 MG tablet, Take by mouth., Disp: , Rfl:    albuterol (VENTOLIN HFA) 108 (90 Base) MCG/ACT inhaler, Inhale into the lungs., Disp: , Rfl:    Albuterol Sulfate, sensor, (PROAIR DIGIHALER) 108 (90 Base) MCG/ACT AEPB, Inhale into the lungs., Disp: , Rfl:    amitriptyline (ELAVIL) 10 MG tablet, Take 15 mg by mouth at bedtime. , Disp: , Rfl:    amitriptyline (ELAVIL) 10 MG tablet, Take 2 tablets by mouth at bedtime., Disp: , Rfl:    aspirin EC 81 MG EC tablet, Take 1 tablet (81 mg total) by mouth daily., Disp: 180 tablet, Rfl: 0   atorvastatin (LIPITOR) 80 MG tablet, Take 1 tablet (80 mg total) by mouth at bedtime. (Patient not taking: Reported on 06/01/2021), Disp: 30 tablet, Rfl: 0   atorvastatin (LIPITOR) 80 MG tablet, Take by mouth., Disp: , Rfl:    budesonide-formoterol (SYMBICORT) 160-4.5 MCG/ACT inhaler, Inhale 2 puffs into the lungs 2 (two) times daily., Disp: , Rfl:    busPIRone (BUSPAR) 10 MG tablet, Take 10 mg by mouth 2 (two) times daily., Disp: , Rfl:    busPIRone (BUSPAR) 10 MG tablet, Take by mouth. (Patient not taking: Reported on 06/01/2021), Disp: , Rfl:    clobetasol (TEMOVATE) 0.05 % external solution, Apply topically., Disp: , Rfl:    clobetasol  cream (TEMOVATE) 1.44 %, Apply 1 application topically daily as needed. For itching on psoriasis areas, Disp: , Rfl:    clobetasol cream (TEMOVATE) 3.15 %, Apply 1 application topically daily as needed. For itching on psoriasis areas, Disp: , Rfl:    dabigatran (PRADAXA) 150 MG CAPS capsule, Take 150 mg by mouth 2 (two) times daily., Disp: , Rfl:    dabigatran (PRADAXA) 150 MG CAPS capsule, Take by mouth., Disp: , Rfl:    diltiazem (CARDIZEM CD) 180 MG 24 hr capsule, Take 1 capsule (180 mg total) by mouth daily. (Patient not taking: Reported on 06/01/2021), Disp: 90 capsule, Rfl: 0   diltiazem (TIAZAC) 180 MG 24 hr capsule, Take 1 capsule by mouth daily., Disp: , Rfl:    fluticasone (FLONASE) 50 MCG/ACT nasal spray, Place 2 sprays into both nostrils daily., Disp: , Rfl:    folic acid (FOLVITE) 1 MG tablet, Take by mouth., Disp: , Rfl:    gabapentin (NEURONTIN) 100 MG capsule, Take 100 mg by mouth at bedtime. (Patient not taking: Reported on 06/01/2021), Disp: , Rfl:    gabapentin (NEURONTIN) 300 MG capsule, Take by mouth., Disp: , Rfl:    guaifenesin (HUMIBID E) 400 MG TABS tablet, Take by mouth., Disp: , Rfl:    hydrocortisone 2.5 % cream, Apply topically., Disp: , Rfl:    hydrOXYzine (ATARAX/VISTARIL) 25 MG tablet, Take by mouth., Disp: , Rfl:    Ipratropium-Albuterol (COMBIVENT)  20-100 MCG/ACT AERS respimat, Inhale 1 puff into the lungs every 6 (six) hours as needed for wheezing or shortness of breath. (Patient not taking: Reported on 06/01/2021), Disp: , Rfl:    isosorbide mononitrate (IMDUR) 60 MG 24 hr tablet, Take 60 mg by mouth daily. (Patient not taking: Reported on 06/01/2021), Disp: , Rfl:    isosorbide mononitrate (IMDUR) 60 MG 24 hr tablet, Take by mouth., Disp: , Rfl:    lidocaine (XYLOCAINE) 5 % ointment, Apply topically., Disp: , Rfl:    lisinopril (PRINIVIL,ZESTRIL) 40 MG tablet, Take 20 mg by mouth daily.  (Patient not taking: Reported on 06/01/2021), Disp: , Rfl:    lisinopril  (ZESTRIL) 40 MG tablet, Take by mouth., Disp: , Rfl:    loratadine (CLARITIN) 10 MG tablet, Take 10 mg by mouth daily as needed for allergies. , Disp: , Rfl:    loratadine (CLARITIN) 10 MG tablet, Take 1 tablet by mouth daily. (Patient not taking: Reported on 06/01/2021), Disp: , Rfl:    Melatonin 3 MG CAPS, Take 6 mg by mouth., Disp: , Rfl:    metFORMIN (GLUCOPHAGE) 500 MG tablet, Take by mouth. (Patient not taking: Reported on 06/01/2021), Disp: , Rfl:    metFORMIN (GLUCOPHAGE-XR) 500 MG 24 hr tablet, Take by mouth., Disp: , Rfl:    methotrexate (RHEUMATREX) 2.5 MG tablet, Take by mouth., Disp: , Rfl:    metoprolol tartrate (LOPRESSOR) 100 MG tablet, Take 1 tablet (100 mg total) by mouth 2 (two) times daily. (Patient not taking: Reported on 06/01/2021), Disp: 60 tablet, Rfl: 0   metoprolol tartrate (LOPRESSOR) 50 MG tablet, Take by mouth., Disp: , Rfl:    mometasone (ASMANEX) 220 MCG/INH inhaler, Take by mouth., Disp: , Rfl:    omeprazole (PRILOSEC) 20 MG capsule, Take 20 mg by mouth daily. , Disp: , Rfl:    omeprazole (PRILOSEC) 20 MG capsule, Take by mouth., Disp: , Rfl:    potassium chloride (K-DUR,KLOR-CON) 10 MEQ tablet, Take 10 mEq by mouth daily. (Patient not taking: Reported on 06/01/2021), Disp: , Rfl:    senna (SENOKOT) 8.6 MG TABS tablet, Take 1 tablet by mouth daily as needed for mild constipation., Disp: , Rfl:    senna-docusate (SENOKOT-S) 8.6-50 MG tablet, Take 1 tablet by mouth daily. (Patient not taking: Reported on 06/01/2021), Disp: , Rfl:    simethicone (MYLICON) 80 MG chewable tablet, Chew by mouth., Disp: , Rfl:    traZODone (DESYREL) 50 MG tablet, Take 1 tablet (50 mg total) by mouth at bedtime as needed for sleep., Disp: 30 tablet, Rfl: 0   urea (CARMOL) 20 % cream, Apply topically., Disp: , Rfl:   Past Medical History: Past Medical History:  Diagnosis Date   A-fib ([place])    CAD (coronary artery disease)    Colitis    COPD (chronic obstructive pulmonary disease) ([facility])     Emphysema lung ([facility])    GERD (gastroesophageal reflux disease)    Hypertension    MI (myocardial infarction) ([place]) 1999    Tobacco Use: Social History   Tobacco Use  Smoking Status Former   Packs/day: 1.00   Years: 30.00   Pack years: 30.00   Types: Cigarettes   Quit date: 12/18/2001   Years since quitting: 19.6  Smokeless Tobacco Never    Labs: Recent Review Scientist, physiological     Labs for ITP Cardiac and Pulmonary Rehab Latest Ref Rng & Units 02/04/2013 09/04/2016 05/02/2018 01/05/2019   Cholestrol 0 - 200 mg/dL 185 116 - -  LDLCALC 0 - 99 mg/dL 114(H) 51 - -   HDL >40 mg/dL 38(L) 42 - -   Trlycerides <150 mg/dL 166 116 - -   Hemoglobin A1c 4.8 - 5.6 % - 6.4(H) - -   HCO3 20.0 - 28.0 mmol/L - - 36.3(H) 29.6(H)   O2SAT % - - - 81.7        Pulmonary Assessment Scores:  Pulmonary Assessment Scores     Row Name 06/13/21 1157         ADL UCSD   ADL Phase Entry     SOB Score total 41     Rest 0     Walk 1     Stairs 3     Bath 3     Dress 1     Shop 3           CAT Score     CAT Score 15           mMRC Score     mMRC Score 1             UCSD: Self-administered rating of dyspnea associated with activities of daily living (ADLs) 6-point scale (0 = "not at all" to 5 = "maximal or unable to do because of breathlessness")  Scoring Scores range from 0 to 120.  Minimally important difference is 5 units  CAT: CAT can identify the health impairment of COPD patients and is better correlated with disease progression.  CAT has a scoring range of zero to 40. The CAT score is classified into four groups of low (less than 10), medium (10 - 20), high (21-30) and very high (31-40) based on the impact level of disease on health status. A CAT score over 10 suggests significant symptoms.  A worsening CAT score could be explained by an exacerbation, poor medication adherence, poor inhaler technique, or progression of COPD or comorbid conditions.  CAT MCID is 2  points  mMRC: mMRC (Modified Medical Research Council) Dyspnea Scale is used to assess the degree of baseline functional disability in patients of respiratory disease due to dyspnea. No minimal important difference is established. A decrease in score of 1 point or greater is considered a positive change.   Pulmonary Function Assessment:  Pulmonary Function Assessment - 06/01/21 1555       Breath   Shortness of Breath Yes;Fear of Shortness of Breath;Limiting activity             Exercise Target Goals: Exercise Program Goal: Individual exercise prescription set using results from initial 6 min walk test and THRR while considering  patient's activity barriers and safety.   Exercise Prescription Goal: Initial exercise prescription builds to 30-45 minutes a day of aerobic activity, 2-3 days per week.  Home exercise guidelines will be given to patient during program as part of exercise prescription that the participant will acknowledge.  Education: Aerobic Exercise: - Group verbal and visual presentation on the components of exercise prescription. Introduces F.I.T.T principle from ACSM for exercise prescriptions.  Reviews F.I.T.T. principles of aerobic exercise including progression. Written material given at graduation. Flowsheet Row Pulmonary Rehab from 02/26/2019 in [facility] Cardiac and Pulmonary Rehab  Date 01/10/19  Educator [facility]  Instruction Review Code 1- Verbalizes Understanding       Education: Resistance Exercise: - Group verbal and visual presentation on the components of exercise prescription. Introduces F.I.T.T principle from ACSM for exercise prescriptions  Reviews F.I.T.T. principles of resistance exercise including progression. Written material given at graduation.  Education: Exercise & Equipment Safety: - Individual verbal instruction and demonstration of equipment use and safety with use of the equipment. Flowsheet Row Pulmonary Rehab from 07/21/2021 in [facility] Cardiac  and Pulmonary Rehab  Date 06/01/21  Educator [facility]  Instruction Review Code 1- Verbalizes Understanding       Education: Exercise Physiology & General Exercise Guidelines: - Group verbal and written instruction with models to review the exercise physiology of the cardiovascular system and associated critical values. Provides general exercise guidelines with specific guidelines to those with heart or lung disease.  Flowsheet Row Pulmonary Rehab from 02/26/2019 in [facility] Cardiac and Pulmonary Rehab  Date 01/08/19  Educator [facility]  Instruction Review Code 1- Verbalizes Understanding       Education: Flexibility, Balance, Mind/Body Relaxation: - Group verbal and visual presentation with interactive activity on the components of exercise prescription. Introduces F.I.T.T principle from ACSM for exercise prescriptions. Reviews F.I.T.T. principles of flexibility and balance exercise training including progression. Also discusses the mind body connection.  Reviews various relaxation techniques to help reduce and manage stress (i.e. Deep breathing, progressive muscle relaxation, and visualization). Balance handout provided to take home. Written material given at graduation. Flowsheet Row Pulmonary Rehab from 02/26/2019 in [facility] Cardiac and Pulmonary Rehab  Date 01/15/19  Educator [facility]  Instruction Review Code 1- Verbalizes Understanding       Activity Barriers & Risk Stratification:  Activity Barriers & Cardiac Risk Stratification - 06/13/21 1203       Activity Barriers & Cardiac Risk Stratification   Activity Barriers Shortness of Breath;Deconditioning;Back Problems;Muscular Weakness;Balance Concerns;Assistive Device;Other (comment)    Comments neuropathy in feet             6 Minute Walk:  6 Minute Walk     Row Name 06/13/21 1206         6 Minute Walk   Phase Initial     Distance 620 feet     Walk Time 4.5 minutes     # of Rest Breaks 3  1:57-2:15; 2:28-3:13; 4:09-4:44     MPH 1.17      METS 1.69     RPE 15     Perceived Dyspnea  3     VO2 Peak 5.93     Symptoms Yes (comment)     Comments Lower back pain 6/10, SOB     Resting HR 70 bpm     Resting BP 142/80     Resting Oxygen Saturation  94 %     Exercise Oxygen Saturation  during 6 min walk 84 %     Max Ex. HR 90 bpm     Max Ex. BP 170/80     2 Minute Post BP 130/82           Interval HR     1 Minute HR 89     2 Minute HR 88     3 Minute HR 88     4 Minute HR 90     5 Minute HR 75     6 Minute HR 85     2 Minute Post HR 68     Interval Heart Rate? Yes           Interval Oxygen     Interval Oxygen? Yes     Baseline Oxygen Saturation % 94 %     1 Minute Oxygen Saturation % 90 %     1 Minute Liters of Oxygen 3 L     2 Minute Oxygen  Saturation % 88 %     2 Minute Liters of Oxygen 3 L     3 Minute Oxygen Saturation % 87 %     3 Minute Liters of Oxygen 3 L     4 Minute Oxygen Saturation % 84 %     4 Minute Liters of Oxygen 3 L     5 Minute Oxygen Saturation % 84 %     5 Minute Liters of Oxygen 3 L     6 Minute Oxygen Saturation % 85 %     6 Minute Liters of Oxygen 3 L     2 Minute Post Oxygen Saturation % 98 %     2 Minute Post Liters of Oxygen 3 L            Oxygen Initial Assessment:  Oxygen Initial Assessment - 06/13/21 1156       Home Oxygen   Home Oxygen Device Home Concentrator;E-Tanks    Sleep Oxygen Prescription Continuous    Liters per minute 3    Home Exercise Oxygen Prescription Continuous    Liters per minute 3    Home Resting Oxygen Prescription Continuous    Liters per minute 3    Compliance with Home Oxygen Use Yes      Initial 6 min Walk   Oxygen Used Continuous    Liters per minute 3      Program Oxygen Prescription   Program Oxygen Prescription Continuous    Liters per minute 3      Intervention   Short Term Goals To learn and exhibit compliance with exercise, home and travel O2 prescription;To learn and understand importance of monitoring SPO2 with pulse  oximeter and demonstrate accurate use of the pulse oximeter.;To learn and understand importance of maintaining oxygen saturations>88%;To learn and demonstrate proper pursed lip breathing techniques or other breathing techniques. ;To learn and demonstrate proper use of respiratory medications    Long  Term Goals Exhibits compliance with exercise, home  and travel O2 prescription;Verbalizes importance of monitoring SPO2 with pulse oximeter and return demonstration;Maintenance of O2 saturations>88%;Exhibits proper breathing techniques, such as pursed lip breathing or other method taught during program session;Compliance with respiratory medication;Demonstrates proper use of MDI's             Oxygen Re-Evaluation:  Oxygen Re-Evaluation     Row Name 06/23/21 1133 07/07/21 1131           Program Oxygen Prescription   Program Oxygen Prescription Continuous Continuous      Liters per minute 3 3             Home Oxygen      Home Oxygen Device Home Concentrator;E-Tanks Home Concentrator;E-Tanks      Sleep Oxygen Prescription Continuous Continuous      Liters per minute 3 3      Home Exercise Oxygen Prescription Continuous Continuous      Liters per minute 3 3      Home Resting Oxygen Prescription Continuous Continuous      Liters per minute 3 3      Compliance with Home Oxygen Use Yes Yes             Goals/Expected Outcomes      Short Term Goals To learn and exhibit compliance with exercise, home and travel O2 prescription;To learn and understand importance of monitoring SPO2 with pulse oximeter and demonstrate accurate use of the pulse oximeter.;To learn and understand importance of maintaining oxygen saturations>88%;To learn and  demonstrate proper pursed lip breathing techniques or other breathing techniques. ;To learn and demonstrate proper use of respiratory medications To learn and exhibit compliance with exercise, home and travel O2 prescription;To learn and understand importance of  monitoring SPO2 with pulse oximeter and demonstrate accurate use of the pulse oximeter.;To learn and understand importance of maintaining oxygen saturations>88%;To learn and demonstrate proper pursed lip breathing techniques or other breathing techniques. ;To learn and demonstrate proper use of respiratory medications      Long  Term Goals Exhibits compliance with exercise, home  and travel O2 prescription;Verbalizes importance of monitoring SPO2 with pulse oximeter and return demonstration;Maintenance of O2 saturations>88%;Exhibits proper breathing techniques, such as pursed lip breathing or other method taught during program session;Compliance with respiratory medication;Demonstrates proper use of MDI's Exhibits compliance with exercise, home  and travel O2 prescription;Verbalizes importance of monitoring SPO2 with pulse oximeter and return demonstration;Maintenance of O2 saturations>88%;Exhibits proper breathing techniques, such as pursed lip breathing or other method taught during program session;Compliance with respiratory medication;Demonstrates proper use of MDI's      Comments Reviewed PLB technique with pt.  Talked about how it works and it's importance in maintaining their exercise saturations. [name] is doing well with his oxygen and staying compliant. He is doing well with his inhalers and using his PLB routinely.  He is good about checking his sats and will now start to check it during exercise at home.      Goals/Expected Outcomes Short: Become more profiecient at using PLB.   Long: Become independent at using PLB. Short: Continue to montior oxygen as he adds in more exercise Long: Conitnued compliance              Oxygen Discharge (Final Oxygen Re-Evaluation):  Oxygen Re-Evaluation - 07/07/21 1131       Program Oxygen Prescription   Program Oxygen Prescription Continuous    Liters per minute 3      Home Oxygen   Home Oxygen Device Home Concentrator;E-Tanks    Sleep Oxygen  Prescription Continuous    Liters per minute 3    Home Exercise Oxygen Prescription Continuous    Liters per minute 3    Home Resting Oxygen Prescription Continuous    Liters per minute 3    Compliance with Home Oxygen Use Yes      Goals/Expected Outcomes   Short Term Goals To learn and exhibit compliance with exercise, home and travel O2 prescription;To learn and understand importance of monitoring SPO2 with pulse oximeter and demonstrate accurate use of the pulse oximeter.;To learn and understand importance of maintaining oxygen saturations>88%;To learn and demonstrate proper pursed lip breathing techniques or other breathing techniques. ;To learn and demonstrate proper use of respiratory medications    Long  Term Goals Exhibits compliance with exercise, home  and travel O2 prescription;Verbalizes importance of monitoring SPO2 with pulse oximeter and return demonstration;Maintenance of O2 saturations>88%;Exhibits proper breathing techniques, such as pursed lip breathing or other method taught during program session;Compliance with respiratory medication;Demonstrates proper use of MDI's    Comments [name] is doing well with his oxygen and staying compliant. He is doing well with his inhalers and using his PLB routinely.  He is good about checking his sats and will now start to check it during exercise at home.    Goals/Expected Outcomes Short: Continue to montior oxygen as he adds in more exercise Long: Conitnued compliance             Initial Exercise Prescription:  Initial Exercise Prescription -  06/13/21 1200       Date of Initial Exercise RX and Referring Provider   Date 06/13/21    Referring Provider Jenne Pane, MD ([place])      Oxygen   Oxygen Continuous    Liters 3      Treadmill   MPH 0.8    Grade 0    Minutes 15    METs 1.6      NuStep   Level 1    SPM 80    Minutes 15    METs 1.6      REL-XR   Level 1    Speed 50    Minutes 15    METs 1.6      Prescription  Details   Frequency (times per week) 2    Duration Progress to 30 minutes of continuous aerobic without signs/symptoms of physical distress      Intensity   THRR 40-80% of Max Heartrate 102-134    Ratings of Perceived Exertion 11-13    Perceived Dyspnea 0-4      Progression   Progression Continue to progress workloads to maintain intensity without signs/symptoms of physical distress.      Resistance Training   Training Prescription Yes    Weight 3 lb    Reps 10-15             Perform Capillary Blood Glucose checks as needed.  Exercise Prescription Changes:   Exercise Prescription Changes     Row Name 06/13/21 1200 06/29/21 1300 07/07/21 1100 07/12/21 0900       Response to Exercise   Blood Pressure (Admit) 142/80 132/64 -- 138/64    Blood Pressure (Exercise) 170/80 122/64 -- 164/80    Blood Pressure (Exit) 130/82 134/66 -- 126/56    Heart Rate (Admit) 70 bpm 64 bpm -- 75 bpm    Heart Rate (Exercise) 90 bpm 76 bpm -- 975 bpm    Heart Rate (Exit) 68 bpm 71 bpm -- 70 bpm    Oxygen Saturation (Admit) 94 % 92 % -- 96 %    Oxygen Saturation (Exercise) 84 % 89 % -- 97 %    Oxygen Saturation (Exit) 98 % 98 % -- 98 %    Rating of Perceived Exertion (Exercise) 15 12 -- 12    Perceived Dyspnea (Exercise) 3 1 -- 2    Symptoms Back pain 6/10, SOB none -- SOB    Comments walk test results first full day of exercise -- --    Duration -- Progress to 30 minutes of  aerobic without signs/symptoms of physical distress -- Continue with 30 min of aerobic exercise without signs/symptoms of physical distress.    Intensity -- THRR unchanged -- THRR unchanged         Progression        Progression -- Continue to progress workloads to maintain intensity without signs/symptoms of physical distress. -- Continue to progress workloads to maintain intensity without signs/symptoms of physical distress.    Average METs -- 1.52 -- 2.32         Resistance Training        Training Prescription --  Yes -- Yes    Weight -- 3 lb -- 3 lb    Reps -- 10-15 -- 10-15         Interval Training        Interval Training -- -- -- No         Oxygen  Oxygen -- Continuous -- Continuous    Liters -- 3 -- 3         Treadmill        MPH -- 1 -- 1    Grade -- 0 -- 0    Minutes -- 15 -- 15    METs -- 1.8 -- 1.77         NuStep        Level -- -- -- 3    Minutes -- -- -- 15    METs -- -- -- 2         REL-XR        Level -- 1 -- 2    Minutes -- 15 -- 15    METs -- 1.2 -- 3.2         Home Exercise Plan        Plans to continue exercise at -- -- Home (comment)  walking, treadmill, staff videos Home (comment)  walking, treadmill, staff videos    Frequency -- -- Add 1 additional day to program exercise sessions. Add 1 additional day to program exercise sessions.    Initial Home Exercises Provided -- -- 07/07/21 07/07/21            Exercise Comments:   Exercise Goals and Review:   Exercise Goals     Row Name 06/13/21 1233             Exercise Goals   Increase Physical Activity Yes       Intervention Provide advice, education, support and counseling about physical activity/exercise needs.;Develop an individualized exercise prescription for aerobic and resistive training based on initial evaluation findings, risk stratification, comorbidities and participant's personal goals.       Expected Outcomes Short Term: Attend rehab on a regular basis to increase amount of physical activity.;Long Term: Add in home exercise to make exercise part of routine and to increase amount of physical activity.;Long Term: Exercising regularly at least 3-5 days a week.       Increase Strength and Stamina Yes       Intervention Provide advice, education, support and counseling about physical activity/exercise needs.;Develop an individualized exercise prescription for aerobic and resistive training based on initial evaluation findings, risk stratification, comorbidities and participant's  personal goals.       Expected Outcomes Short Term: Increase workloads from initial exercise prescription for resistance, speed, and METs.;Short Term: Perform resistance training exercises routinely during rehab and add in resistance training at home;Long Term: Improve cardiorespiratory fitness, muscular endurance and strength as measured by increased METs and functional capacity (6MWT)       Able to understand and use rate of perceived exertion (RPE) scale Yes       Intervention Provide education and explanation on how to use RPE scale       Expected Outcomes Short Term: Able to use RPE daily in rehab to express subjective intensity level;Long Term:  Able to use RPE to guide intensity level when exercising independently       Able to understand and use Dyspnea scale Yes       Intervention Provide education and explanation on how to use Dyspnea scale       Expected Outcomes Short Term: Able to use Dyspnea scale daily in rehab to express subjective sense of shortness of breath during exertion;Long Term: Able to use Dyspnea scale to guide intensity level when exercising independently       Knowledge and understanding of Target Heart Rate  Range (THRR) Yes       Intervention Provide education and explanation of THRR including how the numbers were predicted and where they are located for reference       Expected Outcomes Short Term: Able to state/look up THRR;Short Term: Able to use daily as guideline for intensity in rehab;Long Term: Able to use THRR to govern intensity when exercising independently       Able to check pulse independently Yes       Intervention Provide education and demonstration on how to check pulse in carotid and radial arteries.;Review the importance of being able to check your own pulse for safety during independent exercise       Expected Outcomes Short Term: Able to explain why pulse checking is important during independent exercise;Long Term: Able to check pulse independently and  accurately       Understanding of Exercise Prescription Yes       Intervention Provide education, explanation, and written materials on patient's individual exercise prescription       Expected Outcomes Short Term: Able to explain program exercise prescription;Long Term: Able to explain home exercise prescription to exercise independently                Exercise Goals Re-Evaluation :  Exercise Goals Re-Evaluation     Row Name 06/23/21 1132 06/29/21 1315 07/07/21 1118 07/12/21 0903       Exercise Goal Re-Evaluation   Exercise Goals Review Increase Physical Activity;Able to understand and use rate of perceived exertion (RPE) scale;Knowledge and understanding of Target Heart Rate Range (THRR);Understanding of Exercise Prescription;Increase Strength and Stamina;Able to understand and use Dyspnea scale;Able to check pulse independently Increase Physical Activity;Increase Strength and Stamina Increase Physical Activity;Increase Strength and Stamina;Understanding of Exercise Prescription Increase Physical Activity;Increase Strength and Stamina;Understanding of Exercise Prescription    Comments Reviewed RPE and dyspnea scales, THR and program prescription with pt today.  Pt voiced understanding and was given a copy of goals to take home. [name] is doing well for his first couple of sessions here at rehab. He has already increased his speed on the treadmill to 1.0 speed. Will continue to monitor for progression. [name] is doing well in rehab.  He has bought a new treadmill to use at home.  He has started some but would like to get into it more.  Reviewed home exercise with pt today.  Pt plans to walking and treadmill for exercise.  We also talked about adding in some staff videos. Reviewed THR, pulse, RPE, sign and symptoms, pulse oximetery and when to call 911 or MD.  Also discussed weather considerations and indoor options.  Pt voiced understanding. [name] is doing well in rehab.  He has completed his first  four full days of exercise and is up to 3.2 METs on the XR.  We will continue to monitor his progress.    Expected Outcomes Short: Use RPE daily to regulate intensity. Long: Follow program prescription in THR. Short: Continue to increase loads on exercise and tolerate full 30 minutes Long: Continue to build overall strength and stamina Short: Start to add in more exercise at home Long: Continue to improve stamina. Short: Try a little incline on treadmill Long: Continue to improve stamina and follow program prescription.             Discharge Exercise Prescription (Final Exercise Prescription Changes):  Exercise Prescription Changes - 07/12/21 0900       Response to Exercise   Blood Pressure (Admit) 138/64  Blood Pressure (Exercise) 164/80    Blood Pressure (Exit) 126/56    Heart Rate (Admit) 75 bpm    Heart Rate (Exercise) 975 bpm    Heart Rate (Exit) 70 bpm    Oxygen Saturation (Admit) 96 %    Oxygen Saturation (Exercise) 97 %    Oxygen Saturation (Exit) 98 %    Rating of Perceived Exertion (Exercise) 12    Perceived Dyspnea (Exercise) 2    Symptoms SOB    Duration Continue with 30 min of aerobic exercise without signs/symptoms of physical distress.    Intensity THRR unchanged      Progression   Progression Continue to progress workloads to maintain intensity without signs/symptoms of physical distress.    Average METs 2.32      Resistance Training   Training Prescription Yes    Weight 3 lb    Reps 10-15      Interval Training   Interval Training No      Oxygen   Oxygen Continuous    Liters 3      Treadmill   MPH 1    Grade 0    Minutes 15    METs 1.77      NuStep   Level 3    Minutes 15    METs 2      REL-XR   Level 2    Minutes 15    METs 3.2      Home Exercise Plan   Plans to continue exercise at Home (comment)   walking, treadmill, staff videos   Frequency Add 1 additional day to program exercise sessions.    Initial Home Exercises Provided  07/07/21             Nutrition:  Target Goals: Understanding of nutrition guidelines, daily intake of sodium '1500mg'$ , cholesterol '200mg'$ , calories 30% from fat and 7% or less from saturated fats, daily to have 5 or more servings of fruits and vegetables.  Education: All About Nutrition: -Group instruction provided by verbal, written material, interactive activities, discussions, models, and posters to present general guidelines for heart healthy nutrition including fat, fiber, MyPlate, the role of sodium in heart healthy nutrition, utilization of the nutrition label, and utilization of this knowledge for meal planning. Follow up email sent as well. Written material given at graduation. Flowsheet Row Pulmonary Rehab from 07/21/2021 in [facility] Cardiac and Pulmonary Rehab  Date 07/12/21  Educator [facility]  Instruction Review Code 1- Verbalizes Understanding       Biometrics:  Pre Biometrics - 06/13/21 1202       Pre Biometrics   Height 5' 5.75" (1.67 m)    Weight 192 lb 11.2 oz (87.4 kg)    BMI (Calculated) 31.34    Single Leg Stand 1.97 seconds              Nutrition Therapy Plan and Nutrition Goals:  Nutrition Therapy & Goals - 07/05/21 1131       Nutrition Therapy   RD appointment deferred Yes   He would not like to meet with the RD at this time, will continue to follow up.            Nutrition Assessments:  MEDIFICTS Score Key: ?70 Need to make dietary changes  40-70 Heart Healthy Diet ? 40 Therapeutic Level Cholesterol Diet  Flowsheet Row Pulmonary Rehab from 06/13/2021 in [facility] Cardiac and Pulmonary Rehab  Picture Your Plate Total Score on Admission 60      Picture Your Plate Scores: <  40 Unhealthy dietary pattern with much room for improvement. 41-50 Dietary pattern unlikely to meet recommendations for good health and room for improvement. 51-60 More healthful dietary pattern, with some room for improvement.  >60 Healthy dietary pattern, although there may be  some specific behaviors that could be improved.   Nutrition Goals Re-Evaluation:   Nutrition Goals Discharge (Final Nutrition Goals Re-Evaluation):   Psychosocial: Target Goals: Acknowledge presence or absence of significant depression and/or stress, maximize coping skills, provide positive support system. Participant is able to verbalize types and ability to use techniques and skills needed for reducing stress and depression.   Education: Stress, Anxiety, and Depression - Group verbal and visual presentation to define topics covered.  Reviews how body is impacted by stress, anxiety, and depression.  Also discusses healthy ways to reduce stress and to treat/manage anxiety and depression.  Written material given at graduation. Flowsheet Row Pulmonary Rehab from 02/26/2019 in [facility] Cardiac and Pulmonary Rehab  Date 02/19/19  Educator [place]  Instruction Review Code 1- [employer] Understanding       Education: Sleep Hygiene -Provides group verbal and written instruction about how sleep can affect your health.  Define sleep hygiene, discuss sleep cycles and impact of sleep habits. Review good sleep hygiene tips.    Initial Review & Psychosocial Screening:  Initial Psych Review & Screening - 06/01/21 1557       Initial Review   Current issues with Current Sleep Concerns;Current Stress Concerns    Source of Stress Concerns Chronic Illness    Comments He has panic with shortness of breath at times.      Family Dynamics   Good Support System? Yes    Comments He can look to his wife and his two sisters for support.      Barriers   Psychosocial barriers to participate in program There are no identifiable barriers or psychosocial needs.;The patient should benefit from training in stress management and relaxation.      Screening Interventions   Interventions Encouraged to exercise;To provide support and resources with identified psychosocial needs;Provide feedback about the scores to  participant    Expected Outcomes Short Term goal: Utilizing psychosocial counselor, staff and physician to assist with identification of specific Stressors or current issues interfering with healing process. Setting desired goal for each stressor or current issue identified.;Long Term Goal: Stressors or current issues are controlled or eliminated.;Short Term goal: Identification and review with participant of any Quality of Life or Depression concerns found by scoring the questionnaire.;Long Term goal: The participant improves quality of Life and PHQ9 Scores as seen by post scores and/or verbalization of changes             Quality of Life Scores:  Scores of 19 and below usually indicate a poorer quality of life in these areas.  A difference of  2-3 points is a clinically meaningful difference.  A difference of 2-3 points in the total score of the Quality of Life Index has been associated with significant improvement in overall quality of life, self-image, physical symptoms, and general health in studies assessing change in quality of life.  PHQ-9: Recent Review Flowsheet Data     Depression screen [facility] 2/9 06/13/2021 02/26/2019 01/29/2019 12/23/2018 11/20/2016   Decreased Interest 0 1 0 2 1   Down, Depressed, Hopeless 0 0 0 0 1   PHQ - 2 Score 0 1 0 2 2   Altered sleeping 0 '2 2 2 1   '$ Tired, decreased energy 1 2  $'1 3 2   'X$ Change in appetite 0 '1 2 1 1   '$ Feeling bad or failure about yourself  0 0 0 0 0   Trouble concentrating 0 0 0 0 0   Moving slowly or fidgety/restless 0 0 0 1 0   Suicidal thoughts 0 0 0 0 0   PHQ-9 Score '1 6 5 9 6   '$ Difficult doing work/chores - Somewhat difficult Somewhat difficult Very difficult Very difficult      Interpretation of Total Score  Total Score Depression Severity:  1-4 = Minimal depression, 5-9 = Mild depression, 10-14 = Moderate depression, 15-19 = Moderately severe depression, 20-27 = Severe depression   Psychosocial Evaluation and Intervention:   Psychosocial Evaluation - 06/01/21 1557       Psychosocial Evaluation & Interventions   Interventions Encouraged to exercise with the program and follow exercise prescription;Stress management education;Relaxation education    Comments He can look to his wife and his two sisters for support.He has panic with shortness of breath at times.    Expected Outcomes Short: Exercise regularly to support mental health and notify staff of any changes. Long: maintain mental health and well being through teaching of rehab or prescribed medications independently.    Continue Psychosocial Services  Follow up required by staff             Psychosocial Re-Evaluation:  Psychosocial Re-Evaluation     [place] Name 07/07/21 1127             Psychosocial Re-Evaluation   Current issues with Current Stress Concerns;Current Sleep Concerns       Comments [name] is doing well in rehab.  He is feeling pretty good mentally.  His wife had another knee replacement but she is now doing better and is back to work.  He continues to struggle with his sleep.  He gets about 3-4 hrs at night and will take a nap in the afternoon.  He is usually up to 2-3am.  Overall, he is averaging about 6 hrs a day total.       Expected Outcomes Short: Continue to work on sleep  LOng: COntinue to stay positive       Interventions Encouraged to attend Pulmonary Rehabilitation for the exercise                Psychosocial Discharge (Final Psychosocial Re-Evaluation):  Psychosocial Re-Evaluation - 07/07/21 1127       Psychosocial Re-Evaluation   Current issues with Current Stress Concerns;Current Sleep Concerns    Comments [name] is doing well in rehab.  He is feeling pretty good mentally.  His wife had another knee replacement but she is now doing better and is back to work.  He continues to struggle with his sleep.  He gets about 3-4 hrs at night and will take a nap in the afternoon.  He is usually up to 2-3am.  Overall, he is averaging  about 6 hrs a day total.    Expected Outcomes Short: Continue to work on sleep  LOng: COntinue to stay positive    Interventions Encouraged to attend Pulmonary Rehabilitation for the exercise             Education: Education Goals: Education classes will be provided on a weekly basis, covering required topics. Participant will state understanding/return demonstration of topics presented.  Learning Barriers/Preferences:  Learning Barriers/Preferences - 06/01/21 1555       Learning Barriers/Preferences   Learning Barriers None    Learning Preferences  Group Instruction;Individual Instruction;Pictoral;Skilled Demonstration;Verbal Instruction;Video;Written Material             General Pulmonary Education Topics:  Infection Prevention: - Provides verbal and written material to individual with discussion of infection control including proper hand washing and proper equipment cleaning during exercise session. Flowsheet Row Pulmonary Rehab from 07/21/2021 in [facility] Cardiac and Pulmonary Rehab  Date 06/01/21  Educator [facility]  Instruction Review Code 1- Verbalizes Understanding       Falls Prevention: - Provides verbal and written material to individual with discussion of falls prevention and safety. Flowsheet Row Pulmonary Rehab from 07/21/2021 in [facility] Cardiac and Pulmonary Rehab  Date 06/01/21  Educator [facility]  Instruction Review Code 1- Verbalizes Understanding       Chronic Lung Disease Review: - Group verbal instruction with posters, models, PowerPoint presentations and videos,  to review new updates, new respiratory medications, new advancements in procedures and treatments. Providing information on websites and "800" numbers for continued self-education. Includes information about supplement oxygen, available portable oxygen systems, continuous and intermittent flow rates, oxygen safety, concentrators, and Medicare reimbursement for oxygen. Explanation of Pulmonary Drugs, including  class, frequency, complications, importance of spacers, rinsing mouth after steroid MDI's, and proper cleaning methods for nebulizers. Review of basic lung anatomy and physiology related to function, structure, and complications of lung disease. Review of risk factors. Discussion about methods for diagnosing sleep apnea and types of masks and machines for OSA. Includes a review of the use of types of environmental controls: home humidity, furnaces, filters, dust mite/pet prevention, HEPA vacuums. Discussion about weather changes, air quality and the benefits of nasal washing. Instruction on Warning signs, infection symptoms, calling MD promptly, preventive modes, and value of vaccinations. Review of effective airway clearance, coughing and/or vibration techniques. Emphasizing that all should Create an Action Plan. Written material given at graduation. Flowsheet Row Pulmonary Rehab from 07/21/2021 in [facility] Cardiac and Pulmonary Rehab  Date 07/21/21  Educator [facility]  Instruction Review Code 1- Verbalizes Understanding       AED/CPR: - Group verbal and written instruction with the use of models to demonstrate the basic use of the AED with the basic ABC's of resuscitation. Flowsheet Row Pulmonary Rehab from 02/26/2019 in [facility] Cardiac and Pulmonary Rehab  Date 01/01/19  Educator [facility]  Instruction Review Code 1- Verbalizes Understanding        Anatomy and Cardiac Procedures: - Group verbal and visual presentation and models provide information about basic cardiac anatomy and function. Reviews the testing methods done to diagnose heart disease and the outcomes of the test results. Describes the treatment choices: Medical Management, Angioplasty, or Coronary Bypass Surgery for treating various heart conditions including Myocardial Infarction, Angina, Valve Disease, and Cardiac Arrhythmias.  Written material given at graduation.   Medication Safety: - Group verbal and visual instruction to review commonly  prescribed medications for heart and lung disease. Reviews the medication, class of the drug, and side effects. Includes the steps to properly store meds and maintain the prescription regimen.  Written material given at graduation. Flowsheet Row Pulmonary Rehab from 07/21/2021 in [facility] Cardiac and Pulmonary Rehab  Date 07/07/21  Educator [facility]  Instruction Review Code 1- Verbalizes Understanding       Other: -Provides group and verbal instruction on various topics (see comments)   Knowledge Questionnaire Score:  Knowledge Questionnaire Score - 06/13/21 1159       Knowledge Questionnaire Score   Pre Score 18/18  Core Components/Risk Factors/Patient Goals at Admission:  Personal Goals and Risk Factors at Admission - 06/13/21 1235       Core Components/Risk Factors/Patient Goals on Admission    Weight Management Yes;Weight Loss    Intervention Weight Management: Develop a combined nutrition and exercise program designed to reach desired caloric intake, while maintaining appropriate intake of nutrient and fiber, sodium and fats, and appropriate energy expenditure required for the weight goal.;Weight Management: Provide education and appropriate resources to help participant work on and attain dietary goals.;Weight Management/Obesity: Establish reasonable short term and long term weight goals.    Admit Weight 192 lb (87.1 kg)    Goal Weight: Short Term 187 lb (84.8 kg)    Goal Weight: Long Term 182 lb (82.6 kg)    Expected Outcomes Short Term: Continue to assess and modify interventions until short term weight is achieved;Long Term: Adherence to nutrition and physical activity/exercise program aimed toward attainment of established weight goal;Weight Loss: Understanding of general recommendations for a balanced deficit meal plan, which promotes 1-2 lb weight loss per week and includes a negative energy balance of [phone] kcal/d;Understanding recommendations for meals to include  15-35% energy as protein, 25-35% energy from fat, 35-60% energy from carbohydrates, less than '200mg'$  of dietary cholesterol, 20-35 gm of total fiber daily;Understanding of distribution of calorie intake throughout the day with the consumption of 4-5 meals/snacks    Improve shortness of breath with ADL's Yes    Intervention Provide education, individualized exercise plan and daily activity instruction to help decrease symptoms of SOB with activities of daily living.    Expected Outcomes Short Term: Improve cardiorespiratory fitness to achieve a reduction of symptoms when performing ADLs;Long Term: Be able to perform more ADLs without symptoms or delay the onset of symptoms    Increase knowledge of respiratory medications and ability to use respiratory devices properly  Yes    Intervention Provide education and demonstration as needed of appropriate use of medications, inhalers, and oxygen therapy.    Expected Outcomes Short Term: Achieves understanding of medications use. Understands that oxygen is a medication prescribed by physician. Demonstrates appropriate use of inhaler and oxygen therapy.;Long Term: Maintain appropriate use of medications, inhalers, and oxygen therapy.    Diabetes Yes    Intervention Provide education about signs/symptoms and action to take for hypo/hyperglycemia.;Provide education about proper nutrition, including hydration, and aerobic/resistive exercise prescription along with prescribed medications to achieve blood glucose in normal ranges: Fasting glucose 65-99 mg/dL    Expected Outcomes Short Term: Participant verbalizes understanding of the signs/symptoms and immediate care of hyper/hypoglycemia, proper foot care and importance of medication, aerobic/resistive exercise and nutrition plan for blood glucose control.;Long Term: Attainment of HbA1C < 7%.    Hypertension Yes    Intervention Provide education on lifestyle modifcations including regular physical activity/exercise,  weight management, moderate sodium restriction and increased consumption of fresh fruit, vegetables, and low fat dairy, alcohol moderation, and smoking cessation.;Monitor prescription use compliance.    Expected Outcomes Short Term: Continued assessment and intervention until BP is < 140/68m HG in hypertensive participants. < 130/831mHG in hypertensive participants with diabetes, heart failure or chronic kidney disease.;Long Term: Maintenance of blood pressure at goal levels.    Lipids Yes    Intervention Provide education and support for participant on nutrition & aerobic/resistive exercise along with prescribed medications to achieve LDL '70mg'$ , HDL >'40mg'$ .    Expected Outcomes Short Term: Participant states understanding of desired cholesterol values and is compliant with medications prescribed. Participant  is following exercise prescription and nutrition guidelines.;Long Term: Cholesterol controlled with medications as prescribed, with individualized exercise RX and with personalized nutrition plan. Value goals: LDL < '70mg'$ , HDL > 40 mg.             Education:Diabetes - Individual verbal and written instruction to review signs/symptoms of diabetes, desired ranges of glucose level fasting, after meals and with exercise. Acknowledge that pre and post exercise glucose checks will be done for 3 sessions at entry of program. Flowsheet Row Pulmonary Rehab from 07/21/2021 in [facility] Cardiac and Pulmonary Rehab  Date 06/01/21  Educator [facility]  Instruction Review Code 1- Verbalizes Understanding       Know Your Numbers and Heart Failure: - Group verbal and visual instruction to discuss disease risk factors for cardiac and pulmonary disease and treatment options.  Reviews associated critical values for Overweight/Obesity, Hypertension, Cholesterol, and Diabetes.  Discusses basics of heart failure: signs/symptoms and treatments.  Introduces Heart Failure Zone chart for action plan for heart failure.  Written  material given at graduation. Flowsheet Row Pulmonary Rehab from 07/21/2021 in [facility] Cardiac and Pulmonary Rehab  Date 07/14/21  Educator [facility]  Instruction Review Code 1- Verbalizes Understanding       Core Components/Risk Factors/Patient Goals Review:   Goals and Risk Factor Review     Row Name 07/07/21 1129             Core Components/Risk Factors/Patient Goals Review   Personal Goals Review Weight Management/Obesity;Improve shortness of breath with ADL's;Hypertension;Diabetes;Increase knowledge of respiratory medications and ability to use respiratory devices properly.       Review [name] is doing well in rehab.  He is still try to lose weight.  He admits to the salt being a probelm for him.  His sugars have been doing well overall.  He is trying to keep it under 130 and usually averages between 100-115 mg/dl when he checks it on Sunday mornings.  He is doing well with all his meds and using oxygen and inhalers like he is supposed to.  His pressures have been doing well.  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       Expected Outcomes Short: Continue to check numbers routinely LOng; Continue to cut back on salt                Core Components/Risk Factors/Patient Goals at Discharge (Final Review):   Goals and Risk Factor Review - 07/07/21 1129       Core Components/Risk Factors/Patient Goals Review   Personal Goals Review Weight Management/Obesity;Improve shortness of breath with ADL's;Hypertension;Diabetes;Increase knowledge of respiratory medications and ability to use respiratory devices properly.    Review [name] is doing well in rehab.  He is still try to lose weight.  He admits to the salt being a probelm for him.  His sugars have been doing well overall.  He is trying to keep it under 130 and usually averages between 100-115 mg/dl when he checks it on Sunday mornings.  He is doing  well with all his meds and using oxygen and inhalers like he is supposed to.  His pressures have been doing well.  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    Expected Outcomes Short: Continue to check  numbers routinely LOng; Continue to cut back on salt             ITP Comments:  ITP Comments     Row Name 06/01/21 1552 06/13/21 1152 06/23/21 1131 06/29/21 1136 07/27/21 0809   ITP Comments Virtual Visit completed. Patient informed on EP and RD appointment and 6 Minute walk test. Patient also informed of patient health questionnaires on My Chart. Patient Verbalizes understanding. Visit diagnosis can be found in [facility]. Patient is [facility]. Completed 6MWT and gym orientation. Initial ITP created and sent for review to Dr.Fuad [facility], Medical Director. First full day of exercise!  Patient was oriented to gym and equipment including functions, settings, policies, and procedures.  Patient's individual exercise prescription and treatment plan were reviewed.  All starting workloads were established based on the results of the 6 minute walk test done at initial orientation visit.  The plan for exercise progression was also introduced and progression will be customized based on patient's performance and goals. 30 Day review completed. Medical Director ITP review done, changes made as directed, and signed approval by Medical Director.    New to program 30 Day review completed. Medical Director ITP review done, changes made as directed, and signed approval by Medical Director.            Comments:

## 2021-07-28 ENCOUNTER — Other Ambulatory Visit: Payer: Self-pay

## 2021-07-28 DIAGNOSIS — J449 Chronic obstructive pulmonary disease, unspecified: Secondary | ICD-10-CM

## 2021-07-28 DIAGNOSIS — R0602 Shortness of breath: Secondary | ICD-10-CM

## 2021-07-28 NOTE — Progress Notes (Signed)
Daily Session Note  Patient Details  Name: Darius Norris MRN: [id number] Date of Birth: 09/15/1951 Referring Provider:   Flowsheet Row Pulmonary Rehab from 06/13/2021 in [facility] Card[facility] Re[facility]  Referring Provider Mary Koethe, MD ([place])       Encounter Date: 07/28/2021  Check In:  Session Check In - 07/28/21 1031       Check-In   Supervising physician immediately available to respond to emergencies See telemetry face sheet for immediately available ER MD    Location [facility]Card[facility] & Pulmonary Rehab    Staff Present  , MPA, RN;Melissa Caiola, RDN, LDN;Jessica Hawkins, MA, RCEP, CCRP, CCET;Susanne Bice, RN, BSN, CCRP    Virtual Visit No    Medication changes reported     No    Fall or balance concerns reported    No    Tobacco Cessation No Change    Warm-up and Cool-down Performed on first and last piece of equipment    Resistance Training Performed Yes    VAD Patient? No    PAD/SET Patient? No      Pain Assessment   Currently in Pain? No/denies                Social History   Tobacco Use  Smoking Status Former   Packs/day: 1.00   Years: 30.00   Pack years: 30.00   Types: Cigarettes   Quit date: 12/18/2001   Years since quitting: 19.6  Smokeless Tobacco Never    Goals Met:  [facility] with exercise equipment Exercise tolerated well No report of cardiac concerns or symptoms Strength training completed today  Goals Unmet:  Not Applicable  Comments: Pt able to follow exercise prescription today without complaint.  Will continue to monitor for progression.    Dr. Mark Miller is Medical Director for [facility].  Dr. Fuad Aleskerov is Medical Director for [name]. 

## 2021-08-04 ENCOUNTER — Other Ambulatory Visit: Payer: Self-pay

## 2021-08-04 DIAGNOSIS — R0602 Shortness of breath: Secondary | ICD-10-CM

## 2021-08-04 DIAGNOSIS — J449 Chronic obstructive pulmonary disease, unspecified: Secondary | ICD-10-CM

## 2021-08-04 NOTE — Progress Notes (Signed)
Daily Session Note  Patient Details  Name: Darius Norris MRN: [id number] Date of Birth: 02-23-1951 Referring Provider:   Flowsheet Row Pulmonary Rehab from 06/13/2021 in [facility] Cardiac and Pulmonary Rehab  Referring Provider Jenne Pane, MD ([place])       Encounter Date: 08/04/2021  Check In:  Session Check In - 08/04/21 1036       Check-In   Supervising physician immediately available to respond to emergencies See telemetry face sheet for immediately available ER MD    Location [facility]    Staff Present Birdie Sons, MPA, RN;Melissa [place], RDN, LDN;Joseph [place], RCP,RRT,BSRT    Virtual Visit No    Medication changes reported     No    Fall or balance concerns reported    No    Tobacco Cessation No Change    Warm-up and Cool-down Performed on first and last piece of equipment    Resistance Training Performed Yes    VAD Patient? No    PAD/SET Patient? No      Pain Assessment   Currently in Pain? No/denies                Social History   Tobacco Use  Smoking Status Former   Packs/day: 1.00   Years: 30.00   Pack years: 30.00   Types: Cigarettes   Quit date: 12/18/2001   Years since quitting: 19.6  Smokeless Tobacco Never    Goals Met:  [facility] with exercise equipment Exercise tolerated well No report of cardiac concerns or symptoms Strength training completed today  Goals Unmet:  Not Applicable  Comments: Pt able to follow exercise prescription today without complaint.  Will continue to monitor for progression.    Dr. Emily Filbert is Medical Director for [place].  Dr. Ottie Glazier is Medical Director for [facility] Pulmonary Rehabilitation.

## 2021-08-09 ENCOUNTER — Other Ambulatory Visit: Payer: Self-pay

## 2021-08-09 DIAGNOSIS — J449 Chronic obstructive pulmonary disease, unspecified: Secondary | ICD-10-CM | POA: Diagnosis not present

## 2021-08-09 DIAGNOSIS — R0602 Shortness of breath: Secondary | ICD-10-CM

## 2021-08-09 NOTE — Progress Notes (Signed)
Daily Session Note  Patient Details  Name: Darius Norris MRN: [id number] Date of Birth: 07-25-1951 Referring Provider:   Flowsheet Row Pulmonary Rehab from 06/13/2021 in [facility] Card[facility] and Pulmonary Rehab  Referring Provider Jenne Pane, MD ([place])       Encounter Date: 08/09/2021  Check In:  Session Check In - 08/09/21 1102       Check-In   Supervising physician immediately available to respond to emergencies See telemetry face sheet for immediately available ER MD    Location [facility]    Staff Present Birdie Sons, MPA, Nino Glow, MS, ASCM CEP, Exercise Physiologist;Amanda Oletta Darter, BA, ACSM CEP, Exercise Physiologist;Jessica Luan Pulling, MA, RCEP, CCRP, CCET    Virtual Visit No    Medication changes reported     No    Fall or balance concerns reported    No    Tobacco Cessation No Change    Warm-up and Cool-down Performed on first and last piece of equipment    Resistance Training Performed Yes    VAD Patient? No    PAD/SET Patient? No      Pain Assessment   Currently in Pain? No/denies                Social History   Tobacco Use  Smoking Status Former   Packs/day: 1.00   Years: 30.00   Pack years: 30.00   Types: Cigarettes   Quit date: 12/18/2001   Years since quitting: 19.6  Smokeless Tobacco Never    Goals Met:  [facility] with exercise equipment Exercise tolerated well No report of cardiac concerns or symptoms Strength training completed today  Goals Unmet:  Not Applicable  Comments: Pt able to follow exercise prescription today without complaint.  Will continue to monitor for progression.    Dr. Emily Filbert is Medical Director for [place].  Dr. Ottie Glazier is Medical Director for [facility] Pulmonary Rehabilitation.

## 2021-08-11 ENCOUNTER — Other Ambulatory Visit: Payer: Self-pay

## 2021-08-11 DIAGNOSIS — J449 Chronic obstructive pulmonary disease, unspecified: Secondary | ICD-10-CM

## 2021-08-11 DIAGNOSIS — R0602 Shortness of breath: Secondary | ICD-10-CM

## 2021-08-11 NOTE — Progress Notes (Signed)
Daily Session Note  Patient Details  Name: GILLIS BOARDLEY MRN: [id number] Date of Birth: 1951/02/01 Referring Provider:   Flowsheet Row Pulmonary Rehab from 06/13/2021 in [facility] Cardiac and Pulmonary Rehab  Referring Provider Jenne Pane, MD ([place])       Encounter Date: 08/11/2021  Check In:  Session Check In - 08/11/21 1032       Check-In   Supervising physician immediately available to respond to emergencies See telemetry face sheet for immediately available ER MD    Location [facility]    Staff Present Birdie Sons, MPA, RN;Amanda Sommer, BA, ACSM CEP, Exercise Physiologist;Melissa Caiola, RDN, LDN    Virtual Visit No    Medication changes reported     No    Fall or balance concerns reported    No    Tobacco Cessation No Change    Warm-up and Cool-down Performed on first and last piece of equipment    Resistance Training Performed Yes    VAD Patient? No    PAD/SET Patient? No      Pain Assessment   Currently in Pain? No/denies                Social History   Tobacco Use  Smoking Status Former   Packs/day: 1.00   Years: 30.00   Pack years: 30.00   Types: Cigarettes   Quit date: 12/18/2001   Years since quitting: 19.6  Smokeless Tobacco Never    Goals Met:  [facility] with exercise equipment Exercise tolerated well No report of cardiac concerns or symptoms Strength training completed today  Goals Unmet:  Not Applicable  Comments: Pt able to follow exercise prescription today without complaint.  Will continue to monitor for progression.    Dr. Emily Filbert is Medical Director for [facility].  Dr. Ottie Glazier is Medical Director for [facility]monary [facility].

## 2021-08-16 ENCOUNTER — Other Ambulatory Visit: Payer: Self-pay

## 2021-08-16 DIAGNOSIS — J449 Chronic obstructive pulmonary disease, unspecified: Secondary | ICD-10-CM | POA: Diagnosis not present

## 2021-08-16 DIAGNOSIS — R0602 Shortness of breath: Secondary | ICD-10-CM

## 2021-08-16 NOTE — Progress Notes (Signed)
Daily Session Note  Patient Details  Name: TERIQUE KAWABATA MRN: [id number] Date of Birth: 04-09-1951 Referring Provider:   Flowsheet Row Pulmonary Rehab from 06/13/2021 in [facility] Cardiac and Pulmonary Rehab  Referring Provider Jenne Pane, MD ([place])       Encounter Date: 08/16/2021  Check In:  Session Check In - 08/16/21 1056       Check-In   Supervising physician immediately available to respond to emergencies See telemetry face sheet for immediately available ER MD    Location [facility]    Staff Present Birdie Sons, MPA, RN;Jessica [place], MA, RCEP, CCRP, CCET;Amanda Sommer, BA, ACSM CEP, Exercise Physiologist    Virtual Visit No    Medication changes reported     No    Fall or balance concerns reported    No    Tobacco Cessation No Change    Warm-up and Cool-down Performed on first and last piece of equipment    Resistance Training Performed Yes    VAD Patient? No    PAD/SET Patient? No      Pain Assessment   Currently in Pain? No/denies                Social History   Tobacco Use  Smoking Status Former   Packs/day: 1.00   Years: 30.00   Pack years: 30.00   Types: Cigarettes   Quit date: 12/18/2001   Years since quitting: 19.6  Smokeless Tobacco Never    Goals Met:  [facility] with exercise equipment Exercise tolerated well No report of concerns or symptoms today Strength training completed today  Goals Unmet:  Not Applicable  Comments: Pt able to follow exercise prescription today without complaint.  Will continue to monitor for progression.    Dr. Emily Filbert is Medical Director for [facility].  Dr. Ottie Glazier is Medical Director for [facility] Pulmonary Rehabilitation.

## 2021-08-18 ENCOUNTER — Encounter: Payer: No Typology Code available for payment source | Attending: Anesthesiology | Admitting: *Deleted

## 2021-08-18 ENCOUNTER — Other Ambulatory Visit: Payer: Self-pay

## 2021-08-18 DIAGNOSIS — J449 Chronic obstructive pulmonary disease, unspecified: Secondary | ICD-10-CM | POA: Insufficient documentation

## 2021-08-18 DIAGNOSIS — R0602 Shortness of breath: Secondary | ICD-10-CM | POA: Insufficient documentation

## 2021-08-18 NOTE — Progress Notes (Signed)
Daily Session Note  Patient Details  Name: ZAVIER CANELA MRN: [id number] Date of Birth: [birth year] Referring Provider:   Flowsheet Row Pulmonary Rehab from 06/13/2021 in [facility] Cardiac and Pulmonary Rehab  Referring Provider Jenne Pane, MD ([place])       Encounter Date: 08/18/2021  Check In:  Session Check In - 08/18/21 1034       Check-In   Supervising physician immediately available to respond to emergencies See telemetry face sheet for immediately available ER MD    Location [facility] Re[facility]    Staff Present Hope Budds, RDN, LDN;Bardia Wangerin Denmark, MA, RCEP, CCRP, Rosalio Macadamia, BS, ACSM CEP, Exercise Physiologist;Joseph Plaza, Fabio Neighbors, MPA, RN    Virtual Visit No    Medication changes reported     No    Fall or balance concerns reported    No    Warm-up and Cool-down Performed on first and last piece of equipment    Resistance Training Performed Yes    VAD Patient? No    PAD/SET Patient? No      Pain Assessment   Currently in Pain? No/denies                Social History   Tobacco Use  Smoking Status Former   Packs/day: 1.00   Years: 30.00   Pack years: 30.00   Types: Cigarettes   Quit date: 12/18/2001   Years since quitting: 19.6  Smokeless Tobacco Never    Goals Met:  Proper associated with RPD/PD & O2 Sat [facility] with exercise equipment Using PLB without cueing & demonstrates good technique Personal goals reviewed No report of concerns or symptoms today Strength training completed today  Goals Unmet:  Not Applicable  Comments: Pt able to follow exercise prescription today without complaint.  Will continue to monitor for progression.    Dr. Emily Filbert is Medical Director for [place].  Dr. Ottie Glazier is Medical Director for [facility] Pulmonary Rehabilitation.

## 2021-08-23 ENCOUNTER — Encounter: Payer: No Typology Code available for payment source | Admitting: *Deleted

## 2021-08-23 ENCOUNTER — Other Ambulatory Visit: Payer: Self-pay

## 2021-08-23 DIAGNOSIS — J449 Chronic obstructive pulmonary disease, unspecified: Secondary | ICD-10-CM | POA: Diagnosis not present

## 2021-08-23 NOTE — Progress Notes (Signed)
Daily Session Note  Patient Details  Name: Darius Norris MRN: [id number] Date of Birth: Mar 21, 1951 Referring Provider:   Flowsheet Row Pulmonary Rehab from 06/13/2021 in [facility] Cardiac and Pulmonary Rehab  Referring Provider Jenne Pane, MD ([place])       Encounter Date: 08/23/2021  Check In:  Session Check In - 08/23/21 1109       Check-In   Supervising physician immediately available to respond to emergencies See telemetry face sheet for immediately available ER MD    Location [facility]    Staff Present Nyoka Cowden, RN, BSN, Willette Pa, MA, RCEP, CCRP, CCET;Laureen [place], BS, RRT, CPFT;Amanda Sommer, [place], ACSM CEP, Exercise Physiologist    Virtual Visit No    Medication changes reported     No    Fall or balance concerns reported    No    Tobacco Cessation No Change    Warm-up and Cool-down Performed on first and last piece of equipment    Resistance Training Performed No    VAD Patient? No    PAD/SET Patient? No      Pain Assessment   Currently in Pain? No/denies    Multiple Pain Sites No                Social History   Tobacco Use  Smoking Status Former   Packs/day: 1.00   Years: 30.00   Pack years: 30.00   Types: Cigarettes   Quit date: 12/18/2001   Years since quitting: 19.6  Smokeless Tobacco Never    Goals Met:  [facility] with exercise equipment Exercise tolerated well No report of concerns or symptoms today  Goals Unmet:  Not Applicable  Comments: Pt able to follow exercise prescription today without complaint.  Will continue to monitor for progression.    Dr. Emily Filbert is Medical Director for [place].  Dr. Ottie Glazier is Medical Director for [facility].

## 2021-08-24 ENCOUNTER — Encounter: Payer: Self-pay | Admitting: *Deleted

## 2021-08-24 DIAGNOSIS — J449 Chronic obstructive pulmonary disease, unspecified: Secondary | ICD-10-CM

## 2021-08-24 NOTE — Progress Notes (Signed)
Pulmonary Individual Treatment Plan  Patient Details  Name: Darius Norris MRN: [id number] Date of Birth: 27-Feb-1951 Referring Provider:   Flowsheet Row Pulmonary Rehab from 06/13/2021 in [facility] Cardiac and Pulmonary Rehab  Referring Provider Jenne Pane, MD ([place])       Initial Encounter Date:  Flowsheet Row Pulmonary Rehab from 06/13/2021 in [facility] Cardiac and Pulmonary Rehab  Date 06/13/21       Visit Diagnosis: Chronic obstructive pulmonary disease, unspecified COPD type ([place])  Patient's Home Medications on Admission:  Current Outpatient Medications:    acetaminophen (TYLENOL) 325 MG tablet, Take 650 mg by mouth every 6 (six) hours as needed.  (Patient not taking: Reported on 06/01/2021), Disp: , Rfl:    acetaminophen (TYLENOL) 325 MG tablet, Take by mouth., Disp: , Rfl:    albuterol (VENTOLIN HFA) 108 (90 Base) MCG/ACT inhaler, Inhale into the lungs., Disp: , Rfl:    Albuterol Sulfate, sensor, (PROAIR DIGIHALER) 108 (90 Base) MCG/ACT AEPB, Inhale into the lungs., Disp: , Rfl:    amitriptyline (ELAVIL) 10 MG tablet, Take 15 mg by mouth at bedtime. , Disp: , Rfl:    amitriptyline (ELAVIL) 10 MG tablet, Take 2 tablets by mouth at bedtime., Disp: , Rfl:    aspirin EC 81 MG EC tablet, Take 1 tablet (81 mg total) by mouth daily., Disp: 180 tablet, Rfl: 0   atorvastatin (LIPITOR) 80 MG tablet, Take 1 tablet (80 mg total) by mouth at bedtime. (Patient not taking: Reported on 06/01/2021), Disp: 30 tablet, Rfl: 0   atorvastatin (LIPITOR) 80 MG tablet, Take by mouth., Disp: , Rfl:    budesonide-formoterol (SYMBICORT) 160-4.5 MCG/ACT inhaler, Inhale 2 puffs into the lungs 2 (two) times daily., Disp: , Rfl:    busPIRone (BUSPAR) 10 MG tablet, Take 10 mg by mouth 2 (two) times daily., Disp: , Rfl:    busPIRone (BUSPAR) 10 MG tablet, Take by mouth. (Patient not taking: Reported on 06/01/2021), Disp: , Rfl:    clobetasol (TEMOVATE) 0.05 % external solution, Apply topically., Disp: , Rfl:    clobetasol  cream (TEMOVATE) 1.44 %, Apply 1 application topically daily as needed. For itching on psoriasis areas, Disp: , Rfl:    clobetasol cream (TEMOVATE) 3.15 %, Apply 1 application topically daily as needed. For itching on psoriasis areas, Disp: , Rfl:    dabigatran (PRADAXA) 150 MG CAPS capsule, Take 150 mg by mouth 2 (two) times daily., Disp: , Rfl:    dabigatran (PRADAXA) 150 MG CAPS capsule, Take by mouth., Disp: , Rfl:    diltiazem (CARDIZEM CD) 180 MG 24 hr capsule, Take 1 capsule (180 mg total) by mouth daily. (Patient not taking: Reported on 06/01/2021), Disp: 90 capsule, Rfl: 0   diltiazem (TIAZAC) 180 MG 24 hr capsule, Take 1 capsule by mouth daily., Disp: , Rfl:    fluticasone (FLONASE) 50 MCG/ACT nasal spray, Place 2 sprays into both nostrils daily., Disp: , Rfl:    folic acid (FOLVITE) 1 MG tablet, Take by mouth., Disp: , Rfl:    gabapentin (NEURONTIN) 100 MG capsule, Take 100 mg by mouth at bedtime. (Patient not taking: Reported on 06/01/2021), Disp: , Rfl:    gabapentin (NEURONTIN) 300 MG capsule, Take by mouth., Disp: , Rfl:    guaifenesin (HUMIBID E) 400 MG TABS tablet, Take by mouth., Disp: , Rfl:    hydrocortisone 2.5 % cream, Apply topically., Disp: , Rfl:    hydrOXYzine (ATARAX/VISTARIL) 25 MG tablet, Take by mouth., Disp: , Rfl:    Ipratropium-Albuterol (COMBIVENT)  20-100 MCG/ACT AERS respimat, Inhale 1 puff into the lungs every 6 (six) hours as needed for wheezing or shortness of breath. (Patient not taking: Reported on 06/01/2021), Disp: , Rfl:    isosorbide mononitrate (IMDUR) 60 MG 24 hr tablet, Take 60 mg by mouth daily. (Patient not taking: Reported on 06/01/2021), Disp: , Rfl:    isosorbide mononitrate (IMDUR) 60 MG 24 hr tablet, Take by mouth., Disp: , Rfl:    lidocaine (XYLOCAINE) 5 % ointment, Apply topically., Disp: , Rfl:    lisinopril (PRINIVIL,ZESTRIL) 40 MG tablet, Take 20 mg by mouth daily.  (Patient not taking: Reported on 06/01/2021), Disp: , Rfl:    lisinopril  (ZESTRIL) 40 MG tablet, Take by mouth., Disp: , Rfl:    loratadine (CLARITIN) 10 MG tablet, Take 10 mg by mouth daily as needed for allergies. , Disp: , Rfl:    loratadine (CLARITIN) 10 MG tablet, Take 1 tablet by mouth daily. (Patient not taking: Reported on 06/01/2021), Disp: , Rfl:    Melatonin 3 MG CAPS, Take 6 mg by mouth., Disp: , Rfl:    metFORMIN (GLUCOPHAGE) 500 MG tablet, Take by mouth. (Patient not taking: Reported on 06/01/2021), Disp: , Rfl:    metFORMIN (GLUCOPHAGE-XR) 500 MG 24 hr tablet, Take by mouth., Disp: , Rfl:    methotrexate (RHEUMATREX) 2.5 MG tablet, Take by mouth., Disp: , Rfl:    metoprolol tartrate (LOPRESSOR) 100 MG tablet, Take 1 tablet (100 mg total) by mouth 2 (two) times daily. (Patient not taking: Reported on 06/01/2021), Disp: 60 tablet, Rfl: 0   metoprolol tartrate (LOPRESSOR) 50 MG tablet, Take by mouth., Disp: , Rfl:    mometasone (ASMANEX) 220 MCG/INH inhaler, Take by mouth., Disp: , Rfl:    omeprazole (PRILOSEC) 20 MG capsule, Take 20 mg by mouth daily. , Disp: , Rfl:    omeprazole (PRILOSEC) 20 MG capsule, Take by mouth., Disp: , Rfl:    potassium chloride (K-DUR,KLOR-CON) 10 MEQ tablet, Take 10 mEq by mouth daily. (Patient not taking: Reported on 06/01/2021), Disp: , Rfl:    senna (SENOKOT) 8.6 MG TABS tablet, Take 1 tablet by mouth daily as needed for mild constipation., Disp: , Rfl:    senna-docusate (SENOKOT-S) 8.6-50 MG tablet, Take 1 tablet by mouth daily. (Patient not taking: Reported on 06/01/2021), Disp: , Rfl:    simethicone (MYLICON) 80 MG chewable tablet, Chew by mouth., Disp: , Rfl:    traZODone (DESYREL) 50 MG tablet, Take 1 tablet (50 mg total) by mouth at bedtime as needed for sleep., Disp: 30 tablet, Rfl: 0   urea (CARMOL) 20 % cream, Apply topically., Disp: , Rfl:   Past Medical History: Past Medical History:  Diagnosis Date   A-fib ([place])    CAD (coronary artery disease)    Colitis    COPD (chronic obstructive pulmonary disease) ([facility])     Emphysema lung ([facility])    GERD (gastroesophageal reflux disease)    Hypertension    MI (myocardial infarction) ([place]) 1999    Tobacco Use: Social History   Tobacco Use  Smoking Status Former   Packs/day: 1.00   Years: 30.00   Pack years: 30.00   Types: Cigarettes   Quit date: 12/18/2001   Years since quitting: 19.6  Smokeless Tobacco Never    Labs: Recent Review Scientist, physiological     Labs for ITP Cardiac and Pulmonary Rehab Latest Ref Rng & Units 02/04/2013 09/04/2016 05/02/2018 01/05/2019   Cholestrol 0 - 200 mg/dL 185 116 - -  LDLCALC 0 - 99 mg/dL 114(H) 51 - -   HDL >40 mg/dL 38(L) 42 - -   Trlycerides <150 mg/dL 166 116 - -   Hemoglobin A1c 4.8 - 5.6 % - 6.4(H) - -   HCO3 20.0 - 28.0 mmol/L - - 36.3(H) 29.6(H)   O2SAT % - - - 81.7        Pulmonary Assessment Scores:  Pulmonary Assessment Scores     Row Name 06/13/21 1157         ADL UCSD   ADL Phase Entry     SOB Score total 41     Rest 0     Walk 1     Stairs 3     Bath 3     Dress 1     Shop 3           CAT Score   CAT Score 15           mMRC Score   mMRC Score 1              UCSD: Self-administered rating of dyspnea associated with activities of daily living (ADLs) 6-point scale (0 = "not at all" to 5 = "maximal or unable to do because of breathlessness")  Scoring Scores range from 0 to 120.  Minimally important difference is 5 units  CAT: CAT can identify the health impairment of COPD patients and is better correlated with disease progression.  CAT has a scoring range of zero to 40. The CAT score is classified into four groups of low (less than 10), medium (10 - 20), high (21-30) and very high (31-40) based on the impact level of disease on health status. A CAT score over 10 suggests significant symptoms.  A worsening CAT score could be explained by an exacerbation, poor medication adherence, poor inhaler technique, or progression of COPD or comorbid conditions.  CAT MCID is 2  points  mMRC: mMRC (Modified Medical Research Council) Dyspnea Scale is used to assess the degree of baseline functional disability in patients of respiratory disease due to dyspnea. No minimal important difference is established. A decrease in score of 1 point or greater is considered a positive change.   Pulmonary Function Assessment:  Pulmonary Function Assessment - 06/01/21 1555       Breath   Shortness of Breath Yes;Fear of Shortness of Breath;Limiting activity             Exercise Target Goals: Exercise Program Goal: Individual exercise prescription set using results from initial 6 min walk test and THRR while considering  patient's activity barriers and safety.   Exercise Prescription Goal: Initial exercise prescription builds to 30-45 minutes a day of aerobic activity, 2-3 days per week.  Home exercise guidelines will be given to patient during program as part of exercise prescription that the participant will acknowledge.  Education: Aerobic Exercise: - Group verbal and visual presentation on the components of exercise prescription. Introduces F.I.T.T principle from ACSM for exercise prescriptions.  Reviews F.I.T.T. principles of aerobic exercise including progression. Written material given at graduation. Flowsheet Row Pulmonary Rehab from 08/11/2021 in [facility] Cardiac and Pulmonary Rehab  Date 08/11/21  Educator [facility]  Instruction Review Code 1- Verbalizes Understanding       Education: Resistance Exercise: - Group verbal and visual presentation on the components of exercise prescription. Introduces F.I.T.T principle from ACSM for exercise prescriptions  Reviews F.I.T.T. principles of resistance exercise including progression. Written material given at graduation.    Education:  Exercise & Equipment Safety: - Individual verbal instruction and demonstration of equipment use and safety with use of the equipment. Flowsheet Row Pulmonary Rehab from 08/11/2021 in [facility] Cardiac  and Pulmonary Rehab  Date 06/01/21  Educator [facility]  Instruction Review Code 1- Verbalizes Understanding       Education: Exercise Physiology & General Exercise Guidelines: - Group verbal and written instruction with models to review the exercise physiology of the cardiovascular system and associated critical values. Provides general exercise guidelines with specific guidelines to those with heart or lung disease.  Flowsheet Row Pulmonary Rehab from 08/11/2021 in [facility] Cardiac and Pulmonary Rehab  Date 08/04/21  Educator AS  Instruction Review Code 1- Verbalizes Understanding       Education: Flexibility, Balance, Mind/Body Relaxation: - Group verbal and visual presentation with interactive activity on the components of exercise prescription. Introduces F.I.T.T principle from ACSM for exercise prescriptions. Reviews F.I.T.T. principles of flexibility and balance exercise training including progression. Also discusses the mind body connection.  Reviews various relaxation techniques to help reduce and manage stress (i.e. Deep breathing, progressive muscle relaxation, and visualization). Balance handout provided to take home. Written material given at graduation. Flowsheet Row Pulmonary Rehab from 02/26/2019 in [facility] Cardiac and Pulmonary Rehab  Date 01/15/19  Educator AS  Instruction Review Code 1- Verbalizes Understanding       Activity Barriers & Risk Stratification:  Activity Barriers & Cardiac Risk Stratification - 06/13/21 1203       Activity Barriers & Cardiac Risk Stratification   Activity Barriers Shortness of Breath;Deconditioning;Back Problems;Muscular Weakness;Balance Concerns;Assistive Device;Other (comment)    Comments neuropathy in feet             6 Minute Walk:  6 Minute Walk     Row Name 06/13/21 1206         6 Minute Walk   Phase Initial     Distance 620 feet     Walk Time 4.5 minutes     # of Rest Breaks 3  1:57-2:15; 2:28-3:13; 4:09-4:44     MPH 1.17      METS 1.69     RPE 15     Perceived Dyspnea  3     VO2 Peak 5.93     Symptoms Yes (comment)     Comments Lower back pain 6/10, SOB     Resting HR 70 bpm     Resting BP 142/80     Resting Oxygen Saturation  94 %     Exercise Oxygen Saturation  during 6 min walk 84 %     Max Ex. HR 90 bpm     Max Ex. BP 170/80     2 Minute Post BP 130/82           Interval HR   1 Minute HR 89     2 Minute HR 88     3 Minute HR 88     4 Minute HR 90     5 Minute HR 75     6 Minute HR 85     2 Minute Post HR 68     Interval Heart Rate? Yes           Interval Oxygen   Interval Oxygen? Yes     Baseline Oxygen Saturation % 94 %     1 Minute Oxygen Saturation % 90 %     1 Minute Liters of Oxygen 3 L     2 Minute Oxygen Saturation % 88 %  2 Minute Liters of Oxygen 3 L     3 Minute Oxygen Saturation % 87 %     3 Minute Liters of Oxygen 3 L     4 Minute Oxygen Saturation % 84 %     4 Minute Liters of Oxygen 3 L     5 Minute Oxygen Saturation % 84 %     5 Minute Liters of Oxygen 3 L     6 Minute Oxygen Saturation % 85 %     6 Minute Liters of Oxygen 3 L     2 Minute Post Oxygen Saturation % 98 %     2 Minute Post Liters of Oxygen 3 L             Oxygen Initial Assessment:  Oxygen Initial Assessment - 06/13/21 1156       Home Oxygen   Home Oxygen Device Home Concentrator;E-Tanks    Sleep Oxygen Prescription Continuous    Liters per minute 3    Home Exercise Oxygen Prescription Continuous    Liters per minute 3    Home Resting Oxygen Prescription Continuous    Liters per minute 3    Compliance with Home Oxygen Use Yes      Initial 6 min Walk   Oxygen Used Continuous    Liters per minute 3      Program Oxygen Prescription   Program Oxygen Prescription Continuous    Liters per minute 3      Intervention   Short Term Goals To learn and exhibit compliance with exercise, home and travel O2 prescription;To learn and understand importance of monitoring SPO2 with pulse oximeter  and demonstrate accurate use of the pulse oximeter.;To learn and understand importance of maintaining oxygen saturations>88%;To learn and demonstrate proper pursed lip breathing techniques or other breathing techniques. ;To learn and demonstrate proper use of respiratory medications    Long  Term Goals Exhibits compliance with exercise, home  and travel O2 prescription;Verbalizes importance of monitoring SPO2 with pulse oximeter and return demonstration;Maintenance of O2 saturations>88%;Exhibits proper breathing techniques, such as pursed lip breathing or other method taught during program session;Compliance with respiratory medication;Demonstrates proper use of MDI's             Oxygen Re-Evaluation:  Oxygen Re-Evaluation     Row Name 06/23/21 1133 07/07/21 1131 08/09/21 1122         Program Oxygen Prescription   Program Oxygen Prescription Continuous Continuous Continuous     Liters per minute '3 3 3           '$ Home Oxygen   Home Oxygen Device Home Concentrator;E-Tanks Home Concentrator;E-Tanks Home Concentrator;E-Tanks     Sleep Oxygen Prescription Continuous Continuous Continuous     Liters per minute '3 3 3     '$ Home Exercise Oxygen Prescription Continuous Continuous Continuous     Liters per minute '3 3 3     '$ Home Resting Oxygen Prescription Continuous Continuous Continuous     Liters per minute '3 3 3     '$ Compliance with Home Oxygen Use Yes Yes Yes           Goals/Expected Outcomes   Short Term Goals To learn and exhibit compliance with exercise, home and travel O2 prescription;To learn and understand importance of monitoring SPO2 with pulse oximeter and demonstrate accurate use of the pulse oximeter.;To learn and understand importance of maintaining oxygen saturations>88%;To learn and demonstrate proper pursed lip breathing techniques or other breathing techniques. ;To learn and demonstrate  proper use of respiratory medications To learn and exhibit compliance with exercise, home  and travel O2 prescription;To learn and understand importance of monitoring SPO2 with pulse oximeter and demonstrate accurate use of the pulse oximeter.;To learn and understand importance of maintaining oxygen saturations>88%;To learn and demonstrate proper pursed lip breathing techniques or other breathing techniques. ;To learn and demonstrate proper use of respiratory medications To learn and exhibit compliance with exercise, home and travel O2 prescription;To learn and understand importance of monitoring SPO2 with pulse oximeter and demonstrate accurate use of the pulse oximeter.;To learn and understand importance of maintaining oxygen saturations>88%;To learn and demonstrate proper pursed lip breathing techniques or other breathing techniques. ;To learn and demonstrate proper use of respiratory medications     Long  Term Goals Exhibits compliance with exercise, home  and travel O2 prescription;Verbalizes importance of monitoring SPO2 with pulse oximeter and return demonstration;Maintenance of O2 saturations>88%;Exhibits proper breathing techniques, such as pursed lip breathing or other method taught during program session;Compliance with respiratory medication;Demonstrates proper use of MDI's Exhibits compliance with exercise, home  and travel O2 prescription;Verbalizes importance of monitoring SPO2 with pulse oximeter and return demonstration;Maintenance of O2 saturations>88%;Exhibits proper breathing techniques, such as pursed lip breathing or other method taught during program session;Compliance with respiratory medication;Demonstrates proper use of MDI's Exhibits compliance with exercise, home  and travel O2 prescription;Verbalizes importance of monitoring SPO2 with pulse oximeter and return demonstration;Maintenance of O2 saturations>88%;Exhibits proper breathing techniques, such as pursed lip breathing or other method taught during program session;Compliance with respiratory medication;Demonstrates proper  use of MDI's     Comments Reviewed PLB technique with pt.  Talked about how it works and it's importance in maintaining their exercise saturations. Darius Norris is doing well with his oxygen and staying compliant. He is doing well with his inhalers and using his PLB routinely.  He is good about checking his sats and will now start to check it during exercise at home. Darius Norris is doing well with his oxygen.  He does well with his inhalers and feels pretty good breathing wise.  He will only use his nebulizer if feeling bad.  He is pleased that he has not had an exacerbation recently.  His sats are staying consistent as well.     Goals/Expected Outcomes Short: Become more profiecient at using PLB.   Long: Become independent at using PLB. Short: Continue to montior oxygen as he adds in more exercise Long: Conitnued compliance Short: Continue to improve breathing Long; Continue to use oxygen as prescibed.              Oxygen Discharge (Final Oxygen Re-Evaluation):  Oxygen Re-Evaluation - 08/09/21 1122       Program Oxygen Prescription   Program Oxygen Prescription Continuous    Liters per minute 3      Home Oxygen   Home Oxygen Device Home Concentrator;E-Tanks    Sleep Oxygen Prescription Continuous    Liters per minute 3    Home Exercise Oxygen Prescription Continuous    Liters per minute 3    Home Resting Oxygen Prescription Continuous    Liters per minute 3    Compliance with Home Oxygen Use Yes      Goals/Expected Outcomes   Short Term Goals To learn and exhibit compliance with exercise, home and travel O2 prescription;To learn and understand importance of monitoring SPO2 with pulse oximeter and demonstrate accurate use of the pulse oximeter.;To learn and understand importance of maintaining oxygen saturations>88%;To learn and demonstrate proper pursed lip breathing  techniques or other breathing techniques. ;To learn and demonstrate proper use of respiratory medications    Long  Term Goals Exhibits  compliance with exercise, home  and travel O2 prescription;Verbalizes importance of monitoring SPO2 with pulse oximeter and return demonstration;Maintenance of O2 saturations>88%;Exhibits proper breathing techniques, such as pursed lip breathing or other method taught during program session;Compliance with respiratory medication;Demonstrates proper use of MDI's    Comments Darius Norris is doing well with his oxygen.  He does well with his inhalers and feels pretty good breathing wise.  He will only use his nebulizer if feeling bad.  He is pleased that he has not had an exacerbation recently.  His sats are staying consistent as well.    Goals/Expected Outcomes Short: Continue to improve breathing Long; Continue to use oxygen as prescibed.             Initial Exercise Prescription:  Initial Exercise Prescription - 06/13/21 1200       Date of Initial Exercise RX and Referring Provider   Date 06/13/21    Referring Provider Jenne Pane, MD ([place])      Oxygen   Oxygen Continuous    Liters 3      Treadmill   MPH 0.8    Grade 0    Minutes 15    METs 1.6      NuStep   Level 1    SPM 80    Minutes 15    METs 1.6      REL-XR   Level 1    Speed 50    Minutes 15    METs 1.6      Prescription Details   Frequency (times per week) 2    Duration Progress to 30 minutes of continuous aerobic without signs/symptoms of physical distress      Intensity   THRR 40-80% of Max Heartrate 102-134    Ratings of Perceived Exertion 11-13    Perceived Dyspnea 0-4      Progression   Progression Continue to progress workloads to maintain intensity without signs/symptoms of physical distress.      Resistance Training   Training Prescription Yes    Weight 3 lb    Reps 10-15             Perform Capillary Blood Glucose checks as needed.  Exercise Prescription Changes:   Exercise Prescription Changes     Row Name 06/13/21 1200 06/29/21 1300 07/07/21 1100 07/12/21 0900 07/27/21 1600     Response  to Exercise   Blood Pressure (Admit) 142/80 132/64 -- 138/64 126/62   Blood Pressure (Exercise) 170/80 122/64 -- 164/80 --   Blood Pressure (Exit) 130/82 134/66 -- 126/56 112/56   Heart Rate (Admit) 70 bpm 64 bpm -- 75 bpm 67 bpm   Heart Rate (Exercise) 90 bpm 76 bpm -- 975 bpm 82 bpm   Heart Rate (Exit) 68 bpm 71 bpm -- 70 bpm 69 bpm   Oxygen Saturation (Admit) 94 % 92 % -- 96 % 95 %   Oxygen Saturation (Exercise) 84 % 89 % -- 97 % 96 %   Oxygen Saturation (Exit) 98 % 98 % -- 98 % 97 %   Rating of Perceived Exertion (Exercise) 15 12 -- 12 12   Perceived Dyspnea (Exercise) 3 1 -- 2 2   Symptoms Back pain 6/10, SOB none -- SOB SOB   Comments walk test results first full day of exercise -- -- --   Duration -- Progress to 30 minutes  of  aerobic without signs/symptoms of physical distress -- Continue with 30 min of aerobic exercise without signs/symptoms of physical distress. Continue with 30 min of aerobic exercise without signs/symptoms of physical distress.   Intensity -- THRR unchanged -- THRR unchanged THRR unchanged     Progression   Progression -- Continue to progress workloads to maintain intensity without signs/symptoms of physical distress. -- Continue to progress workloads to maintain intensity without signs/symptoms of physical distress. Continue to progress workloads to maintain intensity without signs/symptoms of physical distress.   Average METs -- 1.52 -- 2.32 2.6     Resistance Training   Training Prescription -- Yes -- Yes Yes   Weight -- 3 lb -- 3 lb 3 lb   Reps -- 10-15 -- 10-15 10-15     Interval Training   Interval Training -- -- -- No No     Oxygen   Oxygen -- Continuous -- Continuous Continuous   Liters -- 3 -- 3 3     Treadmill   MPH -- 1 -- 1 1.2   Grade -- 0 -- 0 0   Minutes -- 15 -- 15 15   METs -- 1.8 -- 1.77 1.92     NuStep   Level -- -- -- 3 --   Minutes -- -- -- 15 --   METs -- -- -- 2 --     REL-XR   Level -- 1 -- 2 2   Minutes -- 15 -- 15 15    METs -- 1.2 -- 3.2 3.3     Home Exercise Plan   Plans to continue exercise at -- -- Home (comment)  walking, treadmill, staff videos Home (comment)  walking, treadmill, staff videos Home (comment)  walking, treadmill, staff videos   Frequency -- -- Add 1 additional day to program exercise sessions. Add 1 additional day to program exercise sessions. Add 1 additional day to program exercise sessions.   Initial Home Exercises Provided -- -- 07/07/21 07/07/21 07/07/21    Row Name 08/08/21 1600             Response to Exercise   Blood Pressure (Admit) 122/76       Blood Pressure (Exit) 128/70       Heart Rate (Admit) 69 bpm       Heart Rate (Exercise) 84 bpm       Heart Rate (Exit) 69 bpm       Oxygen Saturation (Admit) 98 %       Oxygen Saturation (Exercise) 97 %       Oxygen Saturation (Exit) 98 %       Rating of Perceived Exertion (Exercise) 12       Perceived Dyspnea (Exercise) 1       Symptoms SOB       Duration Continue with 30 min of aerobic exercise without signs/symptoms of physical distress.       Intensity THRR unchanged               Progression   Progression Continue to progress workloads to maintain intensity without signs/symptoms of physical distress.       Average METs 2.26               Resistance Training   Training Prescription Yes       Weight 3 lb       Reps 10-15               Interval Training   Interval  Training No               Oxygen   Oxygen Continuous       Liters 3               Treadmill   MPH 1.4       Grade 0       Minutes 15       METs 2.07               Recumbant Elliptical   Level 1       Minutes 15       METs 1.6               REL-XR   Level 2       Minutes 15       METs 3.1               Home Exercise Plan   Plans to continue exercise at Home (comment)  walking, treadmill, staff videos       Frequency Add 1 additional day to program exercise sessions.       Initial Home Exercises Provided 07/07/21                 Exercise Comments:   Exercise Goals and Review:   Exercise Goals     Row Name 06/13/21 1233             Exercise Goals   Increase Physical Activity Yes       Intervention Provide advice, education, support and counseling about physical activity/exercise needs.;Develop an individualized exercise prescription for aerobic and resistive training based on initial evaluation findings, risk stratification, comorbidities and participant's personal goals.       Expected Outcomes Short Term: Attend rehab on a regular basis to increase amount of physical activity.;Long Term: Add in home exercise to make exercise part of routine and to increase amount of physical activity.;Long Term: Exercising regularly at least 3-5 days a week.       Increase Strength and Stamina Yes       Intervention Provide advice, education, support and counseling about physical activity/exercise needs.;Develop an individualized exercise prescription for aerobic and resistive training based on initial evaluation findings, risk stratification, comorbidities and participant's personal goals.       Expected Outcomes Short Term: Increase workloads from initial exercise prescription for resistance, speed, and METs.;Short Term: Perform resistance training exercises routinely during rehab and add in resistance training at home;Long Term: Improve cardiorespiratory fitness, muscular endurance and strength as measured by increased METs and functional capacity (6MWT)       Able to understand and use rate of perceived exertion (RPE) scale Yes       Intervention Provide education and explanation on how to use RPE scale       Expected Outcomes Short Term: Able to use RPE daily in rehab to express subjective intensity level;Long Term:  Able to use RPE to guide intensity level when exercising independently       Able to understand and use Dyspnea scale Yes       Intervention Provide education and explanation on how to use Dyspnea scale        Expected Outcomes Short Term: Able to use Dyspnea scale daily in rehab to express subjective sense of shortness of breath during exertion;Long Term: Able to use Dyspnea scale to guide intensity level when exercising independently       Knowledge and understanding of Target Heart  Rate Range (THRR) Yes       Intervention Provide education and explanation of THRR including how the numbers were predicted and where they are located for reference       Expected Outcomes Short Term: Able to state/look up THRR;Short Term: Able to use daily as guideline for intensity in rehab;Long Term: Able to use THRR to govern intensity when exercising independently       Able to check pulse independently Yes       Intervention Provide education and demonstration on how to check pulse in carotid and radial arteries.;Review the importance of being able to check your own pulse for safety during independent exercise       Expected Outcomes Short Term: Able to explain why pulse checking is important during independent exercise;Long Term: Able to check pulse independently and accurately       Understanding of Exercise Prescription Yes       Intervention Provide education, explanation, and written materials on patient's individual exercise prescription       Expected Outcomes Short Term: Able to explain program exercise prescription;Long Term: Able to explain home exercise prescription to exercise independently                Exercise Goals Re-Evaluation :  Exercise Goals Re-Evaluation     Row Name 06/23/21 1132 06/29/21 1315 07/07/21 1118 07/12/21 0903 07/27/21 1605     Exercise Goal Re-Evaluation   Exercise Goals Review Increase Physical Activity;Able to understand and use rate of perceived exertion (RPE) scale;Knowledge and understanding of Target Heart Rate Range (THRR);Understanding of Exercise Prescription;Increase Strength and Stamina;Able to understand and use Dyspnea scale;Able to check pulse independently  Increase Physical Activity;Increase Strength and Stamina Increase Physical Activity;Increase Strength and Stamina;Understanding of Exercise Prescription Increase Physical Activity;Increase Strength and Stamina;Understanding of Exercise Prescription Increase Physical Activity;Increase Strength and Stamina   Comments Reviewed RPE and dyspnea scales, THR and program prescription with pt today.  Pt voiced understanding and was given a copy of goals to take home. Darius Norris is doing well for his first couple of sessions here at rehab. He has already increased his speed on the treadmill to 1.0 speed. Will continue to monitor for progression. Darius Norris is doing well in rehab.  He has bought a new treadmill to use at home.  He has started some but would like to get into it more.  Reviewed home exercise with pt today.  Pt plans to walking and treadmill for exercise.  We also talked about adding in some staff videos. Reviewed THR, pulse, RPE, sign and symptoms, pulse oximetery and when to call 911 or MD.  Also discussed weather considerations and indoor options.  Pt voiced understanding. Darius Norris is doing well in rehab.  He has completed his first four full days of exercise and is up to 3.2 METs on the XR.  We will continue to monitor his progress. Darius Norris is doing well.  Oxygen stays in the 90s durign sessions.  We will continue to monitor progress.   Expected Outcomes Short: Use RPE daily to regulate intensity. Long: Follow program prescription in THR. Short: Continue to increase loads on exercise and tolerate full 30 minutes Long: Continue to build overall strength and stamina Short: Start to add in more exercise at home Long: Continue to improve stamina. Short: Try a little incline on treadmill Long: Continue to improve stamina and follow program prescription. Short: attend consistently  Long:  build overll stamina    Row Name 08/08/21 1641 08/09/21 1113  Exercise Goal Re-Evaluation   Exercise Goals Review Increase  Physical Activity;Increase Strength and Stamina;Understanding of Exercise Prescription Increase Physical Activity;Increase Strength and Stamina;Understanding of Exercise Prescription      Comments Darius Norris is doing well in rehab.  He up to 1.4 mph on the treadmill.  We will continue to monitor his progress. Darius Norris is doing well in rehab.  He fell last week and hurting all over from his bruises. He tries to stay busy doing house work on his off days. He was feeling better prior to falling.      Expected Outcomes Short: Try 4 lb weights Long: COntinue to improve stamina Short: Recover from fall and start to add in more at home Long; Conitnue to improve stamina.               Discharge Exercise Prescription (Final Exercise Prescription Changes):  Exercise Prescription Changes - 08/08/21 1600       Response to Exercise   Blood Pressure (Admit) 122/76    Blood Pressure (Exit) 128/70    Heart Rate (Admit) 69 bpm    Heart Rate (Exercise) 84 bpm    Heart Rate (Exit) 69 bpm    Oxygen Saturation (Admit) 98 %    Oxygen Saturation (Exercise) 97 %    Oxygen Saturation (Exit) 98 %    Rating of Perceived Exertion (Exercise) 12    Perceived Dyspnea (Exercise) 1    Symptoms SOB    Duration Continue with 30 min of aerobic exercise without signs/symptoms of physical distress.    Intensity THRR unchanged      Progression   Progression Continue to progress workloads to maintain intensity without signs/symptoms of physical distress.    Average METs 2.26      Resistance Training   Training Prescription Yes    Weight 3 lb    Reps 10-15      Interval Training   Interval Training No      Oxygen   Oxygen Continuous    Liters 3      Treadmill   MPH 1.4    Grade 0    Minutes 15    METs 2.07      Recumbant Elliptical   Level 1    Minutes 15    METs 1.6      REL-XR   Level 2    Minutes 15    METs 3.1      Home Exercise Plan   Plans to continue exercise at Home (comment)   walking, treadmill,  staff videos   Frequency Add 1 additional day to program exercise sessions.    Initial Home Exercises Provided 07/07/21             Nutrition:  Target Goals: Understanding of nutrition guidelines, daily intake of sodium '1500mg'$ , cholesterol '200mg'$ , calories 30% from fat and 7% or less from saturated fats, daily to have 5 or more servings of fruits and vegetables.  Education: All About Nutrition: -Group instruction provided by verbal, written material, interactive activities, discussions, models, and posters to present general guidelines for heart healthy nutrition including fat, fiber, MyPlate, the role of sodium in heart healthy nutrition, utilization of the nutrition label, and utilization of this knowledge for meal planning. Follow up email sent as well. Written material given at graduation. Flowsheet Row Pulmonary Rehab from 08/11/2021 in [facility] Cardiac and Pulmonary Rehab  Date 07/12/21  Educator [employer] [facility]  Instruction Review Code 1- Verbalizes Understanding       Biometrics:  Pre Biometrics -  06/13/21 1202       Pre Biometrics   Height 5' 5.75" (1.67 m)    Weight 192 lb 11.2 oz (87.4 kg)    BMI (Calculated) 31.34    Single Leg Stand 1.97 seconds              Nutrition Therapy Plan and Nutrition Goals:  Nutrition Therapy & Goals - 07/05/21 1131       Nutrition Therapy   RD appointment deferred Yes   He would not like to meet with the RD at this time, will continue to follow up.            Nutrition Assessments:  MEDIFICTS Score Key: ?70 Need to make dietary changes  40-70 Heart Healthy Diet ? 40 Therapeutic Level Cholesterol Diet  Flowsheet Row Pulmonary Rehab from 06/13/2021 in [facility] Cardiac and Pulmonary Rehab  Picture Your Plate Total Score on Admission 60      Picture Your Plate Scores: [id number] Unhealthy dietary pattern with much room for improvement. 41-50 Dietary pattern unlikely to meet recommendations for good health and room for improvement. 51-60  More healthful dietary pattern, with some room for improvement.  >60 Healthy dietary pattern, although there may be some specific behaviors that could be improved.   Nutrition Goals Re-Evaluation:  Nutrition Goals Re-Evaluation     [place] Name 08/09/21 1120             Goals   Nutrition Goal Decrease soda intake, Lose weight       Comment Darius Norris is doing well in rehab.  He and his wife do not cook much and eat a lot of sandwiches during the week.  He does try to cook more on the weekend.  He is usually eating biscuits for breakfast, but he is good about getting lots of fruit in his diet. He continue to try to make healthier choices.  He has cut back on his salt and knows he could do better.  He has now switched to diet [employer] and is sticking with them.       Expected Outcome Short: Continue to cut back on salt Long: [place] work on weight loss                Nutrition Goals Discharge (Final Nutrition Goals Re-Evaluation):  Nutrition Goals Re-Evaluation - 08/09/21 1120       Goals   Nutrition Goal Decrease soda intake, Lose weight    Comment Darius Norris is doing well in rehab.  He and his wife do not cook much and eat a lot of sandwiches during the week.  He does try to cook more on the weekend.  He is usually eating biscuits for breakfast, but he is good about getting lots of fruit in his diet. He continue to try to make healthier choices.  He has cut back on his salt and knows he could do better.  He has now switched to diet [employer] and is sticking with them.    Expected Outcome Short: Continue to cut back on salt Long: [place] work on weight loss             Psychosocial: Target Goals: Acknowledge presence or absence of significant depression and/or stress, maximize coping skills, provide positive support system. Participant is able to verbalize types and ability to use techniques and skills needed for reducing stress and depression.   Education: Stress, Anxiety, and Depression - Group  verbal and visual presentation to define topics covered.  Reviews how body  is impacted by stress, anxiety, and depression.  Also discusses healthy ways to reduce stress and to treat/manage anxiety and depression.  Written material given at graduation. Flowsheet Row Pulmonary Rehab from 08/11/2021 in [facility] Cardiac and Pulmonary Rehab  Date 07/28/21  Educator [facility]  Instruction Review Code 1- [employer] Understanding       Education: Sleep Hygiene -Provides group verbal and written instruction about how sleep can affect your health.  Define sleep hygiene, discuss sleep cycles and impact of sleep habits. Review good sleep hygiene tips.    Initial Review & Psychosocial Screening:  Initial Psych Review & Screening - 06/01/21 1557       Initial Review   Current issues with Current Sleep Concerns;Current Stress Concerns    Source of Stress Concerns Chronic Illness    Comments He has panic with shortness of breath at times.      Family Dynamics   Good Support System? Yes    Comments He can look to his wife and his two sisters for support.      Barriers   Psychosocial barriers to participate in program There are no identifiable barriers or psychosocial needs.;The patient should benefit from training in stress management and relaxation.      Screening Interventions   Interventions Encouraged to exercise;To provide support and resources with identified psychosocial needs;Provide feedback about the scores to participant    Expected Outcomes Short Term goal: Utilizing psychosocial counselor, staff and physician to assist with identification of specific Stressors or current issues interfering with healing process. Setting desired goal for each stressor or current issue identified.;Long Term Goal: Stressors or current issues are controlled or eliminated.;Short Term goal: Identification and review with participant of any Quality of Life or Depression concerns found by scoring the questionnaire.;Long Term  goal: The participant improves quality of Life and PHQ9 Scores as seen by post scores and/or verbalization of changes             Quality of Life Scores:  Scores of 19 and below usually indicate a poorer quality of life in these areas.  A difference of  2-3 points is a clinically meaningful difference.  A difference of 2-3 points in the total score of the Quality of Life Index has been associated with significant improvement in overall quality of life, self-image, physical symptoms, and general health in studies assessing change in quality of life.  PHQ-9: Recent Review Flowsheet Data     Depression screen [facility] 2/9 06/13/2021 02/26/2019 01/29/2019 12/23/2018 11/20/2016   Decreased Interest 0 1 0 2 1   Down, Depressed, Hopeless 0 0 0 0 1   PHQ - 2 Score 0 1 0 2 2   Altered sleeping 0 '2 2 2 1   '$ Tired, decreased energy '1 2 1 3 2   '$ Change in appetite 0 '1 2 1 1   '$ Feeling bad or failure about yourself  0 0 0 0 0   Trouble concentrating 0 0 0 0 0   Moving slowly or fidgety/restless 0 0 0 1 0   Suicidal thoughts 0 0 0 0 0   PHQ-9 Score '1 6 5 9 6   '$ Difficult doing work/chores - Somewhat difficult Somewhat difficult Very difficult Very difficult      Interpretation of Total Score  Total Score Depression Severity:  1-4 = Minimal depression, 5-9 = Mild depression, 10-14 = Moderate depression, 15-19 = Moderately severe depression, 20-27 = Severe depression   Psychosocial Evaluation and Intervention:  Psychosocial Evaluation -  06/01/21 1557       Psychosocial Evaluation & Interventions   Interventions Encouraged to exercise with the program and follow exercise prescription;Stress management education;Relaxation education    Comments He can look to his wife and his two sisters for support.He has panic with shortness of breath at times.    Expected Outcomes Short: Exercise regularly to support mental health and notify staff of any changes. Long: maintain mental health and well being through  teaching of rehab or prescribed medications independently.    Continue Psychosocial Services  Follow up required by staff             Psychosocial Re-Evaluation:  Psychosocial Re-Evaluation     [place] Name 07/07/21 1127 08/09/21 1118           Psychosocial Re-Evaluation   Current issues with Current Stress Concerns;Current Sleep Concerns Current Stress Concerns;Current Sleep Concerns      Comments Darius Norris is doing well in rehab.  He is feeling pretty good mentally.  His wife had another knee replacement but she is now doing better and is back to work.  He continues to struggle with his sleep.  He gets about 3-4 hrs at night and will take a nap in the afternoon.  He is usually up to 2-3am.  Overall, he is averaging about 6 hrs a day total. Darius Norris is doing well in rehab.  He is holding up fairly well mentally.  He wishes he could see his grandkids again as its been almost a year since he last saw then.  He had a fall out with their mother and has cut them off some.  He is still sleeping about the same and up and down all night.  He does aim for his afternoon nap.      Expected Outcomes Short: Continue to work on sleep  LOng: COntinue to stay positive Short: Continue to try to work on things with seeing grandkids Long: Continue to focus on positive.      Interventions Encouraged to attend Pulmonary Rehabilitation for the exercise Encouraged to attend Pulmonary Rehabilitation for the exercise      Continue Psychosocial Services  -- Follow up required by staff               Psychosocial Discharge (Final Psychosocial Re-Evaluation):  Psychosocial Re-Evaluation - 08/09/21 1118       Psychosocial Re-Evaluation   Current issues with Current Stress Concerns;Current Sleep Concerns    Comments Darius Norris is doing well in rehab.  He is holding up fairly well mentally.  He wishes he could see his grandkids again as its been almost a year since he last saw then.  He had a fall out with their mother and has  cut them off some.  He is still sleeping about the same and up and down all night.  He does aim for his afternoon nap.    Expected Outcomes Short: Continue to try to work on things with seeing grandkids Long: Continue to focus on positive.    Interventions Encouraged to attend Pulmonary Rehabilitation for the exercise    Continue Psychosocial Services  Follow up required by staff             Education: Education Goals: Education classes will be provided on a weekly basis, covering required topics. Participant will state understanding/return demonstration of topics presented.  Learning Barriers/Preferences:  Learning Barriers/Preferences - 06/01/21 1555       Learning Barriers/Preferences   Learning Barriers None  Learning Preferences Group Instruction;Individual Instruction;Pictoral;Skilled Demonstration;Verbal Instruction;Video;Written Material             General Pulmonary Education Topics:  Infection Prevention: - Provides verbal and written material to individual with discussion of infection control including proper hand washing and proper equipment cleaning during exercise session. Flowsheet Row Pulmonary Rehab from 08/11/2021 in [facility] Cardiac and Pulmonary Rehab  Date 06/01/21  Educator [facility]  Instruction Review Code 1- Verbalizes Understanding       Falls Prevention: - Provides verbal and written material to individual with discussion of falls prevention and safety. Flowsheet Row Pulmonary Rehab from 08/11/2021 in [facility] Cardiac and Pulmonary Rehab  Date 06/01/21  Educator [facility]  Instruction Review Code 1- Verbalizes Understanding       Chronic Lung Disease Review: - Group verbal instruction with posters, models, PowerPoint presentations and videos,  to review new updates, new respiratory medications, new advancements in procedures and treatments. Providing information on websites and "800" numbers for continued self-education. Includes information about supplement  oxygen, available portable oxygen systems, continuous and intermittent flow rates, oxygen safety, concentrators, and Medicare reimbursement for oxygen. Explanation of Pulmonary Drugs, including class, frequency, complications, importance of spacers, rinsing mouth after steroid MDI's, and proper cleaning methods for nebulizers. Review of basic lung anatomy and physiology related to function, structure, and complications of lung disease. Review of risk factors. Discussion about methods for diagnosing sleep apnea and types of masks and machines for OSA. Includes a review of the use of types of environmental controls: home humidity, furnaces, filters, dust mite/pet prevention, HEPA vacuums. Discussion about weather changes, air quality and the benefits of nasal washing. Instruction on Warning signs, infection symptoms, calling MD promptly, preventive modes, and value of vaccinations. Review of effective airway clearance, coughing and/or vibration techniques. Emphasizing that all should Create an Action Plan. Written material given at graduation. Flowsheet Row Pulmonary Rehab from 08/11/2021 in [facility] Cardiac and Pulmonary Rehab  Date 07/21/21  Educator [facility]  Instruction Review Code 1- Verbalizes Understanding       AED/CPR: - Group verbal and written instruction with the use of models to demonstrate the basic use of the AED with the basic ABC's of resuscitation. Flowsheet Row Pulmonary Rehab from 02/26/2019 in [facility] Cardiac and Pulmonary Rehab  Date 01/01/19  Educator [facility]  Instruction Review Code 1- Verbalizes Understanding        Anatomy and Cardiac Procedures: - Group verbal and visual presentation and models provide information about basic cardiac anatomy and function. Reviews the testing methods done to diagnose heart disease and the outcomes of the test results. Describes the treatment choices: Medical Management, Angioplasty, or Coronary Bypass Surgery for treating various heart conditions including  Myocardial Infarction, Angina, Valve Disease, and Cardiac Arrhythmias.  Written material given at graduation.   Medication Safety: - Group verbal and visual instruction to review commonly prescribed medications for heart and lung disease. Reviews the medication, class of the drug, and side effects. Includes the steps to properly store meds and maintain the prescription regimen.  Written material given at graduation. Flowsheet Row Pulmonary Rehab from 08/11/2021 in [facility] Cardiac and Pulmonary Rehab  Date 07/07/21  Educator [facility]  Instruction Review Code 1- Verbalizes Understanding       Other: -Provides group and verbal instruction on various topics (see comments)   Knowledge Questionnaire Score:  Knowledge Questionnaire Score - 06/13/21 1159       Knowledge Questionnaire Score   Pre Score 18/18  Core Components/Risk Factors/Patient Goals at Admission:  Personal Goals and Risk Factors at Admission - 06/13/21 1235       Core Components/Risk Factors/Patient Goals on Admission    Weight Management Yes;Weight Loss    Intervention Weight Management: Develop a combined nutrition and exercise program designed to reach desired caloric intake, while maintaining appropriate intake of nutrient and fiber, sodium and fats, and appropriate energy expenditure required for the weight goal.;Weight Management: Provide education and appropriate resources to help participant work on and attain dietary goals.;Weight Management/Obesity: Establish reasonable short term and long term weight goals.    Admit Weight 192 lb (87.1 kg)    Goal Weight: Short Term 187 lb (84.8 kg)    Goal Weight: Long Term 182 lb (82.6 kg)    Expected Outcomes Short Term: Continue to assess and modify interventions until short term weight is achieved;Long Term: Adherence to nutrition and physical activity/exercise program aimed toward attainment of established weight goal;Weight Loss: Understanding of general  recommendations for a balanced deficit meal plan, which promotes 1-2 lb weight loss per week and includes a negative energy balance of [phone] kcal/d;Understanding recommendations for meals to include 15-35% energy as protein, 25-35% energy from fat, 35-60% energy from carbohydrates, less than '200mg'$  of dietary cholesterol, 20-35 gm of total fiber daily;Understanding of distribution of calorie intake throughout the day with the consumption of 4-5 meals/snacks    Improve shortness of breath with ADL's Yes    Intervention Provide education, individualized exercise plan and daily activity instruction to help decrease symptoms of SOB with activities of daily living.    Expected Outcomes Short Term: Improve cardiorespiratory fitness to achieve a reduction of symptoms when performing ADLs;Long Term: Be able to perform more ADLs without symptoms or delay the onset of symptoms    Increase knowledge of respiratory medications and ability to use respiratory devices properly  Yes    Intervention Provide education and demonstration as needed of appropriate use of medications, inhalers, and oxygen therapy.    Expected Outcomes Short Term: Achieves understanding of medications use. Understands that oxygen is a medication prescribed by physician. Demonstrates appropriate use of inhaler and oxygen therapy.;Long Term: Maintain appropriate use of medications, inhalers, and oxygen therapy.    Diabetes Yes    Intervention Provide education about signs/symptoms and action to take for hypo/hyperglycemia.;Provide education about proper nutrition, including hydration, and aerobic/resistive exercise prescription along with prescribed medications to achieve blood glucose in normal ranges: Fasting glucose 65-99 mg/dL    Expected Outcomes Short Term: Participant verbalizes understanding of the signs/symptoms and immediate care of hyper/hypoglycemia, proper foot care and importance of medication, aerobic/resistive exercise and  nutrition plan for blood glucose control.;Long Term: Attainment of HbA1C < 7%.    Hypertension Yes    Intervention Provide education on lifestyle modifcations including regular physical activity/exercise, weight management, moderate sodium restriction and increased consumption of fresh fruit, vegetables, and low fat dairy, alcohol moderation, and smoking cessation.;Monitor prescription use compliance.    Expected Outcomes Short Term: Continued assessment and intervention until BP is < 140/47m HG in hypertensive participants. < 130/861mHG in hypertensive participants with diabetes, heart failure or chronic kidney disease.;Long Term: Maintenance of blood pressure at goal levels.    Lipids Yes    Intervention Provide education and support for participant on nutrition & aerobic/resistive exercise along with prescribed medications to achieve LDL '70mg'$ , HDL >'40mg'$ .    Expected Outcomes Short Term: Participant states understanding of desired cholesterol values and is compliant with medications prescribed. Participant  is following exercise prescription and nutrition guidelines.;Long Term: Cholesterol controlled with medications as prescribed, with individualized exercise RX and with personalized nutrition plan. Value goals: LDL < '70mg'$ , HDL > 40 mg.             Education:Diabetes - Individual verbal and written instruction to review signs/symptoms of diabetes, desired ranges of glucose level fasting, after meals and with exercise. Acknowledge that pre and post exercise glucose checks will be done for 3 sessions at entry of program. Flowsheet Row Pulmonary Rehab from 08/11/2021 in [facility] Cardiac and Pulmonary Rehab  Date 06/01/21  Educator [facility]  Instruction Review Code 1- Verbalizes Understanding       Know Your Numbers and Heart Failure: - Group verbal and visual instruction to discuss disease risk factors for cardiac and pulmonary disease and treatment options.  Reviews associated critical values for  Overweight/Obesity, Hypertension, Cholesterol, and Diabetes.  Discusses basics of heart failure: signs/symptoms and treatments.  Introduces Heart Failure Zone chart for action plan for heart failure.  Written material given at graduation. Flowsheet Row Pulmonary Rehab from 08/11/2021 in [facility] Cardiac and Pulmonary Rehab  Date 07/14/21  Educator KB  Instruction Review Code 1- Verbalizes Understanding       Core Components/Risk Factors/Patient Goals Review:   Goals and Risk Factor Review     Row Name 07/07/21 1129 08/09/21 1115           Core Components/Risk Factors/Patient Goals Review   Personal Goals Review Weight Management/Obesity;Improve shortness of breath with ADL's;Hypertension;Diabetes;Increase knowledge of respiratory medications and ability to use respiratory devices properly. Weight Management/Obesity;Improve shortness of breath with ADL's;Hypertension;Diabetes;Increase knowledge of respiratory medications and ability to use respiratory devices properly.      Review Darius Norris is doing well in rehab.  He is still try to lose weight.  He admits to the salt being a probelm for him.  His sugars have been doing well overall.  He is trying to keep it under 130 and usually averages between 100-115 mg/dl when he checks it on Sunday mornings.  He is doing well with all his meds and using oxygen and inhalers like he is supposed to.  His pressures have been doing well.  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 Darius Norris tries to stay active at home on his off days, but his breathing continues to limit him in how much he is able to do. He is doing well with taking his meds regularly. His weight is staying under 200  lb.  Today, he is 193 lb and seems to hold steady.  His pressures have been doing well and sugars seem to be good.  He is still waiting to here back from his PCP as they ended up doing more blood  work.      Expected Outcomes Short: Continue to check numbers routinely LOng; Continue to cut back on salt Short: Hear back from doctor Long: COntinue to monitor risk factors.               Core Components/Risk Factors/Patient Goals at Discharge (Final Review):   Goals and Risk Factor Review - 08/09/21 1115       Core Components/Risk Factors/Patient Goals Review   Personal Goals Review Weight Management/Obesity;Improve shortness of breath with ADL's;Hypertension;Diabetes;Increase knowledge of respiratory medications and ability to use respiratory devices properly.    Review Darius Norris tries to stay active at home  on his off days, but his breathing continues to limit him in how much he is able to do. He is doing well with taking his meds regularly. His weight is staying under 200  lb.  Today, he is 193 lb and seems to hold steady.  His pressures have been doing well and sugars seem to be good.  He is still waiting to here back from his PCP as they ended up doing more blood work.    Expected Outcomes Short: Hear back from doctor Long: COntinue to monitor risk factors.             ITP Comments:  ITP Comments     Row Name 06/01/21 1552 06/13/21 1152 06/23/21 1131 06/29/21 1136 07/27/21 0809   ITP Comments Virtual Visit completed. Patient informed on EP and RD appointment and 6 Minute walk test. Patient also informed of patient health questionnaires on My Chart. Patient Verbalizes understanding. Visit diagnosis can be found in [facility]. Patient is [place]. Completed 6MWT and gym orientation. Initial ITP created and sent for review to [facility], Medical Director. First full day of exercise!  Patient was oriented to gym and equipment including functions, settings, policies, and procedures.  Patient's individual exercise prescription and treatment plan were reviewed.  All starting workloads were established based on the results of the 6 minute walk test done at initial orientation visit.  The  plan for exercise progression was also introduced and progression will be customized based on patient's performance and goals. 30 Day review completed. Medical Director ITP review done, changes made as directed, and signed approval by Medical Director.    New to program 30 Day review completed. Medical Director ITP review done, changes made as directed, and signed approval by Medical Director.    [place] Name 08/24/21 0737           ITP Comments 30 Day review completed. Medical Director ITP review done, changes made as directed, and signed approval by Medical Director.                Comments:

## 2021-08-25 ENCOUNTER — Encounter: Payer: No Typology Code available for payment source | Admitting: *Deleted

## 2021-08-25 ENCOUNTER — Other Ambulatory Visit: Payer: Self-pay

## 2021-08-25 DIAGNOSIS — J449 Chronic obstructive pulmonary disease, unspecified: Secondary | ICD-10-CM

## 2021-08-25 DIAGNOSIS — R0602 Shortness of breath: Secondary | ICD-10-CM

## 2021-08-25 NOTE — Progress Notes (Signed)
Daily Session Note  Patient Details  Name: RASHED EDLER MRN: [id number] Date of Birth: 10-29-1951 Referring Provider:   Flowsheet Row Pulmonary Rehab from 06/13/2021 in [facility] Cardiac and Pulmonary Rehab  Referring Provider Jenne Pane, MD ([place])       Encounter Date: 08/25/2021  Check In:  Session Check In - 08/25/21 1044       Check-In   Supervising physician immediately available to respond to emergencies See telemetry face sheet for immediately available ER MD    Location [facility]-Cardiac & Pulmonary Rehab    Staff Present Renita Papa, RN BSN;Laureen Owens Shark, BS, RRT, CPFT;Jessica [place], MA, RCEP, CCRP, CCET    Virtual Visit No    Medication changes reported     No    Fall or balance concerns reported    No    Warm-up and Cool-down Performed on first and last piece of equipment    Resistance Training Performed Yes    VAD Patient? No    PAD/SET Patient? No      Pain Assessment   Currently in Pain? No/denies                Social History   Tobacco Use  Smoking Status Former   Packs/day: 1.00   Years: 30.00   Pack years: 30.00   Types: Cigarettes   Quit date: 12/18/2001   Years since quitting: 19.6  Smokeless Tobacco Never    Goals Met:  Independence with exercise equipment Exercise tolerated well No report of concerns or symptoms today Strength training completed today  Goals Unmet:  Not Applicable  Comments: Pt able to follow exercise prescription today without complaint.  Will continue to monitor for progression.    Dr. Emily Filbert is Medical Director for [place].  Dr. Ottie Glazier is Medical Director for [facility] Pulmonary Rehabilitation.

## 2021-09-01 ENCOUNTER — Other Ambulatory Visit: Payer: Self-pay

## 2021-09-01 DIAGNOSIS — R0602 Shortness of breath: Secondary | ICD-10-CM

## 2021-09-01 DIAGNOSIS — J449 Chronic obstructive pulmonary disease, unspecified: Secondary | ICD-10-CM

## 2021-09-01 NOTE — Progress Notes (Signed)
Daily Session Note  Patient Details  Name: Darius Norris MRN: [id number] Date of Birth: January 12, 1951 Referring Provider:   Flowsheet Row Pulmonary Rehab from 06/13/2021 in [facility] Cardiac and Pulmonary Rehab  Referring Provider Jenne Pane, MD ([place])       Encounter Date: 09/01/2021  Check In:  Session Check In - 09/01/21 1037       Check-In   Supervising physician immediately available to respond to emergencies See telemetry face sheet for immediately available ER MD    Location [facility]-Cardiac & Pulmonary Rehab    Staff Present Birdie Sons, MPA, RN;Amanda Sommer, BA, ACSM CEP, Exercise Physiologist;Jessica [place], MA, RCEP, CCRP, CCET;Joseph [place], [place]    Virtual Visit No    Medication changes reported     No    Fall or balance concerns reported    No    Tobacco Cessation No Change    Warm-up and Cool-down Performed on first and last piece of equipment    Resistance Training Performed Yes    VAD Patient? No    PAD/SET Patient? No      Pain Assessment   Currently in Pain? No/denies                Social History   Tobacco Use  Smoking Status Former   Packs/day: 1.00   Years: 30.00   Pack years: 30.00   Types: Cigarettes   Quit date: 12/18/2001   Years since quitting: 19.7  Smokeless Tobacco Never    Goals Met:  Independence with exercise equipment Exercise tolerated well No report of concerns or symptoms today Strength training completed today  Goals Unmet:  Not Applicable  Comments: Pt able to follow exercise prescription today without complaint.  Will continue to monitor for progression.    Dr. Emily Filbert is Medical Director for [place].  Dr. Ottie Glazier is Medical Director for [facility] Pulmonary Rehabilitation.

## 2021-09-08 ENCOUNTER — Other Ambulatory Visit: Payer: Self-pay

## 2021-09-08 DIAGNOSIS — R0602 Shortness of breath: Secondary | ICD-10-CM

## 2021-09-08 DIAGNOSIS — J449 Chronic obstructive pulmonary disease, unspecified: Secondary | ICD-10-CM

## 2021-09-08 NOTE — Progress Notes (Signed)
Daily Session Note  Patient Details  Name: Darius Norris MRN: [id number] Date of Birth: [birth year] Referring Provider:   Flowsheet Row Pulmonary Rehab from 06/13/2021 in [facility] Cardiac and Pulmonary Rehab  Referring Provider Jenne Pane, MD ([place])       Encounter Date: 09/08/2021  Check In:  Session Check In - 09/08/21 1010       Check-In   Supervising physician immediately available to respond to emergencies See telemetry face sheet for immediately available ER MD    Location [facility]-Cardiac & Pulmonary Rehab    Staff Present Birdie Sons, MPA, RN;Amanda Sommer, BA, ACSM CEP, Exercise Physiologist;Jessica [place], MA, RCEP, CCRP, CCET    Virtual Visit No    Medication changes reported     No    Fall or balance concerns reported    No    Tobacco Cessation No Change    Warm-up and Cool-down Performed on first and last piece of equipment    Resistance Training Performed Yes    VAD Patient? No    PAD/SET Patient? No      Pain Assessment   Currently in Pain? No/denies                Social History   Tobacco Use  Smoking Status Former   Packs/day: 1.00   Years: 30.00   Pack years: 30.00   Types: Cigarettes   Quit date: 12/18/2001   Years since quitting: 19.7  Smokeless Tobacco Never    Goals Met:  Independence with exercise equipment Exercise tolerated well No report of concerns or symptoms today Strength training completed today  Goals Unmet:  Not Applicable  Comments: Pt able to follow exercise prescription today without complaint.  Will continue to monitor for progression.    Dr. Emily Filbert is Medical Director for [place].  Dr. Ottie Glazier is Medical Director for [facility] Pulmonary Rehabilitation.

## 2021-09-13 ENCOUNTER — Other Ambulatory Visit: Payer: Self-pay

## 2021-09-13 DIAGNOSIS — J449 Chronic obstructive pulmonary disease, unspecified: Secondary | ICD-10-CM

## 2021-09-13 NOTE — Progress Notes (Signed)
Daily Session Note  Patient Details  Name: Darius Norris MRN: [id number] Date of Birth: 1951-07-29 Referring Provider:   Flowsheet Row Pulmonary Rehab from 06/13/2021 in [facility] Cardiac and Pulmonary Rehab  Referring Provider Jenne Pane, MD ([place])       Encounter Date: 09/13/2021  Check In:  Session Check In - 09/13/21 1059       Check-In   Supervising physician immediately available to respond to emergencies See telemetry face sheet for immediately available ER MD    Location [facility]-Cardiac & Pulmonary Rehab    Staff Present Birdie Sons, MPA, RN;Jessica [place], MA, RCEP, CCRP, CCET;Amanda Sommer, BA, ACSM CEP, Exercise Physiologist    Virtual Visit No    Medication changes reported     No    Fall or balance concerns reported    No    Tobacco Cessation No Change    Warm-up and Cool-down Performed on first and last piece of equipment    Resistance Training Performed Yes    VAD Patient? No    PAD/SET Patient? No      Pain Assessment   Currently in Pain? No/denies                Social History   Tobacco Use  Smoking Status Former   Packs/day: 1.00   Years: 30.00   Pack years: 30.00   Types: Cigarettes   Quit date: 12/18/2001   Years since quitting: 19.7  Smokeless Tobacco Never    Goals Met:  Independence with exercise equipment Exercise tolerated well No report of concerns or symptoms today Strength training completed today  Goals Unmet:  Not Applicable  Comments: Pt able to follow exercise prescription today without complaint.  Will continue to monitor for progression.    Dr. Emily Filbert is Medical Director for [place].  Dr. Ottie Glazier is Medical Director for [facility] Pulmonary Rehabilitation.

## 2021-09-15 ENCOUNTER — Other Ambulatory Visit: Payer: Self-pay

## 2021-09-15 ENCOUNTER — Encounter: Payer: No Typology Code available for payment source | Admitting: *Deleted

## 2021-09-15 DIAGNOSIS — R0602 Shortness of breath: Secondary | ICD-10-CM

## 2021-09-15 DIAGNOSIS — J449 Chronic obstructive pulmonary disease, unspecified: Secondary | ICD-10-CM

## 2021-09-15 NOTE — Progress Notes (Signed)
Daily Session Note  Patient Details  Name: Darius Norris MRN: [id number] Date of Birth: [birth year] Referring Provider:   Flowsheet Row Pulmonary Rehab from 06/13/2021 in [facility] Cardiac and Pulmonary Rehab  Referring Provider Jenne Pane, MD ([place])       Encounter Date: 09/15/2021  Check In:  Session Check In - 09/15/21 1215       Check-In   Staff Present Nyoka Cowden, RN, BSN, Ardeth Sportsman, RDN, LDN;Meredith Sherryll Burger, RN BSN    Virtual Visit No    Medication changes reported     No    Fall or balance concerns reported    No    Tobacco Cessation No Change    Warm-up and Cool-down Performed on first and last piece of equipment    Resistance Training Performed Yes    VAD Patient? No    PAD/SET Patient? No      Pain Assessment   Currently in Pain? No/denies                Social History   Tobacco Use  Smoking Status Former   Packs/day: 1.00   Years: 30.00   Pack years: 30.00   Types: Cigarettes   Quit date: 12/18/2001   Years since quitting: 19.7  Smokeless Tobacco Never    Goals Met:  Independence with exercise equipment Exercise tolerated well No report of concerns or symptoms today  Goals Unmet:  Not Applicable  Comments: Pt able to follow exercise prescription today without complaint.  Will continue to monitor for progression.     Dr. Emily Filbert is Medical Director for [place].  Dr. Ottie Glazier is Medical Director for [facility] Pulmonary Rehabilitation.

## 2021-09-20 ENCOUNTER — Other Ambulatory Visit: Payer: Self-pay

## 2021-09-20 ENCOUNTER — Encounter: Payer: No Typology Code available for payment source | Attending: Anesthesiology

## 2021-09-20 DIAGNOSIS — J449 Chronic obstructive pulmonary disease, unspecified: Secondary | ICD-10-CM | POA: Diagnosis not present

## 2021-09-20 DIAGNOSIS — R0602 Shortness of breath: Secondary | ICD-10-CM | POA: Insufficient documentation

## 2021-09-20 NOTE — Progress Notes (Signed)
Daily Session Note  Patient Details  Name: Darius Norris MRN: [id number] Date of Birth: May 03, 1951 Referring Provider:   Flowsheet Row Pulmonary Rehab from 06/13/2021 in [facility] Cardiac and Pulmonary Rehab  Referring Provider Jenne Pane, MD ([place])       Encounter Date: 09/20/2021  Check In:  Session Check In - 09/20/21 1101       Check-In   Supervising physician immediately available to respond to emergencies See telemetry face sheet for immediately available ER MD    Location [facility]-Cardiac & Pulmonary Rehab    Staff Present Birdie Sons, MPA, RN;Amanda Sommer, BA, ACSM CEP, Exercise Physiologist;Jessica [place], MA, RCEP, CCRP, CCET;Melissa [place], RDN, LDN    Virtual Visit No    Medication changes reported     No    Fall or balance concerns reported    No    Tobacco Cessation No Change    Warm-up and Cool-down Performed on first and last piece of equipment    Resistance Training Performed Yes    VAD Patient? No    PAD/SET Patient? No      Pain Assessment   Currently in Pain? No/denies                Social History   Tobacco Use  Smoking Status Former   Packs/day: 1.00   Years: 30.00   Pack years: 30.00   Types: Cigarettes   Quit date: 12/18/2001   Years since quitting: 19.7  Smokeless Tobacco Never    Goals Met:  Independence with exercise equipment Exercise tolerated well No report of concerns or symptoms today Strength training completed today  Goals Unmet:  Not Applicable  Comments: Pt able to follow exercise prescription today without complaint.  Will continue to monitor for progression.    Dr. Emily Filbert is Medical Director for [place].  Dr. Ottie Glazier is Medical Director for [facility] Pulmonary Rehabilitation.

## 2021-09-21 ENCOUNTER — Encounter: Payer: Self-pay | Admitting: *Deleted

## 2021-09-21 DIAGNOSIS — R0602 Shortness of breath: Secondary | ICD-10-CM

## 2021-09-21 DIAGNOSIS — J449 Chronic obstructive pulmonary disease, unspecified: Secondary | ICD-10-CM

## 2021-09-21 NOTE — Progress Notes (Signed)
Pulmonary Individual Treatment Plan  Patient Details  Name: Darius Norris MRN: [id number] Date of Birth: 1951/03/15 Referring Provider:   Flowsheet Row Pulmonary Rehab from 06/13/2021 in [facility] Cardiac and Pulmonary Rehab  Referring Provider Jenne Pane, MD ([place])       Initial Encounter Date:  Flowsheet Row Pulmonary Rehab from 06/13/2021 in [facility] Cardiac and Pulmonary Rehab  Date 06/13/21       Visit Diagnosis: Chronic obstructive pulmonary disease, unspecified COPD type ([place])  SOB (shortness of breath)  Patient's Home Medications on Admission:  Current Outpatient Medications:    acetaminophen (TYLENOL) 325 MG tablet, Take 650 mg by mouth every 6 (six) hours as needed.  (Patient not taking: Reported on 06/01/2021), Disp: , Rfl:    acetaminophen (TYLENOL) 325 MG tablet, Take by mouth., Disp: , Rfl:    albuterol (VENTOLIN HFA) 108 (90 Base) MCG/ACT inhaler, Inhale into the lungs., Disp: , Rfl:    Albuterol Sulfate, sensor, (PROAIR DIGIHALER) 108 (90 Base) MCG/ACT AEPB, Inhale into the lungs., Disp: , Rfl:    amitriptyline (ELAVIL) 10 MG tablet, Take 15 mg by mouth at bedtime. , Disp: , Rfl:    amitriptyline (ELAVIL) 10 MG tablet, Take 2 tablets by mouth at bedtime., Disp: , Rfl:    aspirin EC 81 MG EC tablet, Take 1 tablet (81 mg total) by mouth daily., Disp: 180 tablet, Rfl: 0   atorvastatin (LIPITOR) 80 MG tablet, Take 1 tablet (80 mg total) by mouth at bedtime. (Patient not taking: Reported on 06/01/2021), Disp: 30 tablet, Rfl: 0   atorvastatin (LIPITOR) 80 MG tablet, Take by mouth., Disp: , Rfl:    budesonide-formoterol (SYMBICORT) 160-4.5 MCG/ACT inhaler, Inhale 2 puffs into the lungs 2 (two) times daily., Disp: , Rfl:    busPIRone (BUSPAR) 10 MG tablet, Take 10 mg by mouth 2 (two) times daily., Disp: , Rfl:    busPIRone (BUSPAR) 10 MG tablet, Take by mouth. (Patient not taking: Reported on 06/01/2021), Disp: , Rfl:    clobetasol (TEMOVATE) 0.05 % external solution, Apply  topically., Disp: , Rfl:    clobetasol cream (TEMOVATE) 7.32 %, Apply 1 application topically daily as needed. For itching on psoriasis areas, Disp: , Rfl:    clobetasol cream (TEMOVATE) 2.02 %, Apply 1 application topically daily as needed. For itching on psoriasis areas, Disp: , Rfl:    dabigatran (PRADAXA) 150 MG CAPS capsule, Take 150 mg by mouth 2 (two) times daily., Disp: , Rfl:    dabigatran (PRADAXA) 150 MG CAPS capsule, Take by mouth., Disp: , Rfl:    diltiazem (CARDIZEM CD) 180 MG 24 hr capsule, Take 1 capsule (180 mg total) by mouth daily. (Patient not taking: Reported on 06/01/2021), Disp: 90 capsule, Rfl: 0   diltiazem (TIAZAC) 180 MG 24 hr capsule, Take 1 capsule by mouth daily., Disp: , Rfl:    fluticasone (FLONASE) 50 MCG/ACT nasal spray, Place 2 sprays into both nostrils daily., Disp: , Rfl:    folic acid (FOLVITE) 1 MG tablet, Take by mouth., Disp: , Rfl:    gabapentin (NEURONTIN) 100 MG capsule, Take 100 mg by mouth at bedtime. (Patient not taking: Reported on 06/01/2021), Disp: , Rfl:    gabapentin (NEURONTIN) 300 MG capsule, Take by mouth., Disp: , Rfl:    guaifenesin (HUMIBID E) 400 MG TABS tablet, Take by mouth., Disp: , Rfl:    hydrocortisone 2.5 % cream, Apply topically., Disp: , Rfl:    hydrOXYzine (ATARAX/VISTARIL) 25 MG tablet, Take by mouth., Disp: , Rfl:  Ipratropium-Albuterol (COMBIVENT) 20-100 MCG/ACT AERS respimat, Inhale 1 puff into the lungs every 6 (six) hours as needed for wheezing or shortness of breath. (Patient not taking: Reported on 06/01/2021), Disp: , Rfl:    isosorbide mononitrate (IMDUR) 60 MG 24 hr tablet, Take 60 mg by mouth daily. (Patient not taking: Reported on 06/01/2021), Disp: , Rfl:    isosorbide mononitrate (IMDUR) 60 MG 24 hr tablet, Take by mouth., Disp: , Rfl:    lidocaine (XYLOCAINE) 5 % ointment, Apply topically., Disp: , Rfl:    lisinopril (PRINIVIL,ZESTRIL) 40 MG tablet, Take 20 mg by mouth daily.  (Patient not taking: Reported on  06/01/2021), Disp: , Rfl:    lisinopril (ZESTRIL) 40 MG tablet, Take by mouth., Disp: , Rfl:    loratadine (CLARITIN) 10 MG tablet, Take 10 mg by mouth daily as needed for allergies. , Disp: , Rfl:    loratadine (CLARITIN) 10 MG tablet, Take 1 tablet by mouth daily. (Patient not taking: Reported on 06/01/2021), Disp: , Rfl:    Melatonin 3 MG CAPS, Take 6 mg by mouth., Disp: , Rfl:    metFORMIN (GLUCOPHAGE) 500 MG tablet, Take by mouth. (Patient not taking: Reported on 06/01/2021), Disp: , Rfl:    metFORMIN (GLUCOPHAGE-XR) 500 MG 24 hr tablet, Take by mouth., Disp: , Rfl:    methotrexate (RHEUMATREX) 2.5 MG tablet, Take by mouth., Disp: , Rfl:    metoprolol tartrate (LOPRESSOR) 100 MG tablet, Take 1 tablet (100 mg total) by mouth 2 (two) times daily. (Patient not taking: Reported on 06/01/2021), Disp: 60 tablet, Rfl: 0   metoprolol tartrate (LOPRESSOR) 50 MG tablet, Take by mouth., Disp: , Rfl:    mometasone (ASMANEX) 220 MCG/INH inhaler, Take by mouth., Disp: , Rfl:    omeprazole (PRILOSEC) 20 MG capsule, Take 20 mg by mouth daily. , Disp: , Rfl:    omeprazole (PRILOSEC) 20 MG capsule, Take by mouth., Disp: , Rfl:    potassium chloride (K-DUR,KLOR-CON) 10 MEQ tablet, Take 10 mEq by mouth daily. (Patient not taking: Reported on 06/01/2021), Disp: , Rfl:    senna (SENOKOT) 8.6 MG TABS tablet, Take 1 tablet by mouth daily as needed for mild constipation., Disp: , Rfl:    senna-docusate (SENOKOT-S) 8.6-50 MG tablet, Take 1 tablet by mouth daily. (Patient not taking: Reported on 06/01/2021), Disp: , Rfl:    simethicone (MYLICON) 80 MG chewable tablet, Chew by mouth., Disp: , Rfl:    traZODone (DESYREL) 50 MG tablet, Take 1 tablet (50 mg total) by mouth at bedtime as needed for sleep., Disp: 30 tablet, Rfl: 0   urea (CARMOL) 20 % cream, Apply topically., Disp: , Rfl:   Past Medical History: Past Medical History:  Diagnosis Date   A-fib ([place])    CAD (coronary artery disease)    Colitis    COPD (chronic  obstructive pulmonary disease) (H[facility])    Emphysema lung (H[facility])    GERD (gastroesophageal reflux disease)    Hypertension    MI (myocardial infarction) ([place]) 1999    Tobacco Use: Social History   Tobacco Use  Smoking Status Former   Packs/day: 1.00   Years: 30.00   Pack years: 30.00   Types: Cigarettes   Quit date: 12/18/2001   Years since quitting: 19.7  Smokeless Tobacco Never    Labs: Recent Review Scientist, physiological     Labs for ITP Cardiac and Pulmonary Rehab Latest Ref Rng & Units 02/04/2013 09/04/2016 05/02/2018 01/05/2019   Cholestrol 0 - 200 mg/dL 185 116 - -  LDLCALC 0 - 99 mg/dL 114(H) 51 - -   HDL >40 mg/dL 38(L) 42 - -   Trlycerides <150 mg/dL 166 116 - -   Hemoglobin A1c 4.8 - 5.6 % - 6.4(H) - -   HCO3 20.0 - 28.0 mmol/L - - 36.3(H) 29.6(H)   O2SAT % - - - 81.7        Pulmonary Assessment Scores:  Pulmonary Assessment Scores     Row Name 06/13/21 1157         ADL UCSD   ADL Phase Entry     SOB Score total 41     Rest 0     Walk 1     Stairs 3     Bath 3     Dress 1     Shop 3           CAT Score   CAT Score 15           mMRC Score   mMRC Score 1              UCSD: Self-administered rating of dyspnea associated with activities of daily living (ADLs) 6-point scale (0 = "not at all" to 5 = "maximal or unable to do because of breathlessness")  Scoring Scores range from 0 to 120.  Minimally important difference is 5 units  CAT: CAT can identify the health impairment of COPD patients and is better correlated with disease progression.  CAT has a scoring range of zero to 40. The CAT score is classified into four groups of low (less than 10), medium (10 - 20), high (21-30) and very high (31-40) based on the impact level of disease on health status. A CAT score over 10 suggests significant symptoms.  A worsening CAT score could be explained by an exacerbation, poor medication adherence, poor inhaler technique, or progression of COPD or comorbid  conditions.  CAT MCID is 2 points  mMRC: mMRC (Modified Medical Research Council) Dyspnea Scale is used to assess the degree of baseline functional disability in patients of respiratory disease due to dyspnea. No minimal important difference is established. A decrease in score of 1 point or greater is considered a positive change.   Pulmonary Function Assessment:  Pulmonary Function Assessment - 06/01/21 1555       Breath   Shortness of Breath Yes;Fear of Shortness of Breath;Limiting activity             Exercise Target Goals: Exercise Program Goal: Individual exercise prescription set using results from initial 6 min walk test and THRR while considering  patient's activity barriers and safety.   Exercise Prescription Goal: Initial exercise prescription builds to 30-45 minutes a day of aerobic activity, 2-3 days per week.  Home exercise guidelines will be given to patient during program as part of exercise prescription that the participant will acknowledge.  Education: Aerobic Exercise: - Group verbal and visual presentation on the components of exercise prescription. Introduces F.I.T.T principle from ACSM for exercise prescriptions.  Reviews F.I.T.T. principles of aerobic exercise including progression. Written material given at graduation. Flowsheet Row Pulmonary Rehab from 09/15/2021 in [facility] Cardiac and Pulmonary Rehab  Date 08/11/21  Educator [facility]  Instruction Review Code 1- Verbalizes Understanding       Education: Resistance Exercise: - Group verbal and visual presentation on the components of exercise prescription. Introduces F.I.T.T principle from ACSM for exercise prescriptions  Reviews F.I.T.T. principles of resistance exercise including progression. Written material given at graduation.    Education:  Exercise & Equipment Safety: - Individual verbal instruction and demonstration of equipment use and safety with use of the equipment. Flowsheet Row Pulmonary Rehab from  09/15/2021 in [facility] Cardiac and Pulmonary Rehab  Date 06/01/21  Educator [facility]  Instruction Review Code 1- Verbalizes Understanding       Education: Exercise Physiology & General Exercise Guidelines: - Group verbal and written instruction with models to review the exercise physiology of the cardiovascular system and associated critical values. Provides general exercise guidelines with specific guidelines to those with heart or lung disease.  Flowsheet Row Pulmonary Rehab from 09/15/2021 in [facility] Cardiac and Pulmonary Rehab  Date 08/04/21  Educator AS  Instruction Review Code 1- Verbalizes Understanding       Education: Flexibility, Balance, Mind/Body Relaxation: - Group verbal and visual presentation with interactive activity on the components of exercise prescription. Introduces F.I.T.T principle from ACSM for exercise prescriptions. Reviews F.I.T.T. principles of flexibility and balance exercise training including progression. Also discusses the mind body connection.  Reviews various relaxation techniques to help reduce and manage stress (i.e. Deep breathing, progressive muscle relaxation, and visualization). Balance handout provided to take home. Written material given at graduation. Flowsheet Row Pulmonary Rehab from 09/15/2021 in [facility] Cardiac and Pulmonary Rehab  Date 08/25/21  Educator AS  Instruction Review Code 1- Verbalizes Understanding       Activity Barriers & Risk Stratification:  Activity Barriers & Cardiac Risk Stratification - 06/13/21 1203       Activity Barriers & Cardiac Risk Stratification   Activity Barriers Shortness of Breath;Deconditioning;Back Problems;Muscular Weakness;Balance Concerns;Assistive Device;Other (comment)    Comments neuropathy in feet             6 Minute Walk:  6 Minute Walk     Row Name 06/13/21 1206         6 Minute Walk   Phase Initial     Distance 620 feet     Walk Time 4.5 minutes     # of Rest Breaks 3  1:57-2:15; 2:28-3:13;  4:09-4:44     MPH 1.17     METS 1.69     RPE 15     Perceived Dyspnea  3     VO2 Peak 5.93     Symptoms Yes (comment)     Comments Lower back pain 6/10, SOB     Resting HR 70 bpm     Resting BP 142/80     Resting Oxygen Saturation  94 %     Exercise Oxygen Saturation  during 6 min walk 84 %     Max Ex. HR 90 bpm     Max Ex. BP 170/80     2 Minute Post BP 130/82           Interval HR   1 Minute HR 89     2 Minute HR 88     3 Minute HR 88     4 Minute HR 90     5 Minute HR 75     6 Minute HR 85     2 Minute Post HR 68     Interval Heart Rate? Yes           Interval Oxygen   Interval Oxygen? Yes     Baseline Oxygen Saturation % 94 %     1 Minute Oxygen Saturation % 90 %     1 Minute Liters of Oxygen 3 L     2 Minute Oxygen Saturation % 88 %  2 Minute Liters of Oxygen 3 L     3 Minute Oxygen Saturation % 87 %     3 Minute Liters of Oxygen 3 L     4 Minute Oxygen Saturation % 84 %     4 Minute Liters of Oxygen 3 L     5 Minute Oxygen Saturation % 84 %     5 Minute Liters of Oxygen 3 L     6 Minute Oxygen Saturation % 85 %     6 Minute Liters of Oxygen 3 L     2 Minute Post Oxygen Saturation % 98 %     2 Minute Post Liters of Oxygen 3 L             Oxygen Initial Assessment:  Oxygen Initial Assessment - 06/13/21 1156       Home Oxygen   Home Oxygen Device Home Concentrator;E-Tanks    Sleep Oxygen Prescription Continuous    Liters per minute 3    Home Exercise Oxygen Prescription Continuous    Liters per minute 3    Home Resting Oxygen Prescription Continuous    Liters per minute 3    Compliance with Home Oxygen Use Yes      Initial 6 min Walk   Oxygen Used Continuous    Liters per minute 3      Program Oxygen Prescription   Program Oxygen Prescription Continuous    Liters per minute 3      Intervention   Short Term Goals To learn and exhibit compliance with exercise, home and travel O2 prescription;To learn and understand importance of monitoring  SPO2 with pulse oximeter and demonstrate accurate use of the pulse oximeter.;To learn and understand importance of maintaining oxygen saturations>88%;To learn and demonstrate proper pursed lip breathing techniques or other breathing techniques. ;To learn and demonstrate proper use of respiratory medications    Long  Term Goals Exhibits compliance with exercise, home  and travel O2 prescription;Verbalizes importance of monitoring SPO2 with pulse oximeter and return demonstration;Maintenance of O2 saturations>88%;Exhibits proper breathing techniques, such as pursed lip breathing or other method taught during program session;Compliance with respiratory medication;Demonstrates proper use of MDI's             Oxygen Re-Evaluation:  Oxygen Re-Evaluation     Row Name 06/23/21 1133 07/07/21 1131 08/09/21 1122 09/08/21 1119       Program Oxygen Prescription   Program Oxygen Prescription Continuous Continuous Continuous Continuous    Liters per minute $RemoveBe'3 3 3 3         '[id number]$ Home Oxygen   Home Oxygen Device Home Concentrator;E-Tanks Home Concentrator;E-Tanks Home Concentrator;E-Tanks Home Concentrator;E-Tanks    Sleep Oxygen Prescription Continuous Continuous Continuous Continuous    Liters per minute $RemoveBe'3 3 3 3    '[id number]$ Home Exercise Oxygen Prescription Continuous Continuous Continuous Continuous    Liters per minute $RemoveBe'3 3 3 3    '[id number]$ Home Resting Oxygen Prescription Continuous Continuous Continuous Continuous    Liters per minute $RemoveBe'3 3 3 3    '[id number]$ Compliance with Home Oxygen Use Yes Yes Yes Yes         Goals/Expected Outcomes   Short Term Goals To learn and exhibit compliance with exercise, home and travel O2 prescription;To learn and understand importance of monitoring SPO2 with pulse oximeter and demonstrate accurate use of the pulse oximeter.;To learn and understand importance of maintaining oxygen saturations>88%;To learn and demonstrate proper pursed lip breathing techniques or other breathing techniques. ;To learn  and demonstrate  proper use of respiratory medications To learn and exhibit compliance with exercise, home and travel O2 prescription;To learn and understand importance of monitoring SPO2 with pulse oximeter and demonstrate accurate use of the pulse oximeter.;To learn and understand importance of maintaining oxygen saturations>88%;To learn and demonstrate proper pursed lip breathing techniques or other breathing techniques. ;To learn and demonstrate proper use of respiratory medications To learn and exhibit compliance with exercise, home and travel O2 prescription;To learn and understand importance of monitoring SPO2 with pulse oximeter and demonstrate accurate use of the pulse oximeter.;To learn and understand importance of maintaining oxygen saturations>88%;To learn and demonstrate proper pursed lip breathing techniques or other breathing techniques. ;To learn and demonstrate proper use of respiratory medications To learn and exhibit compliance with exercise, home and travel O2 prescription;To learn and understand importance of monitoring SPO2 with pulse oximeter and demonstrate accurate use of the pulse oximeter.;To learn and understand importance of maintaining oxygen saturations>88%;To learn and demonstrate proper pursed lip breathing techniques or other breathing techniques. ;To learn and demonstrate proper use of respiratory medications    Long  Term Goals Exhibits compliance with exercise, home  and travel O2 prescription;Verbalizes importance of monitoring SPO2 with pulse oximeter and return demonstration;Maintenance of O2 saturations>88%;Exhibits proper breathing techniques, such as pursed lip breathing or other method taught during program session;Compliance with respiratory medication;Demonstrates proper use of MDI's Exhibits compliance with exercise, home  and travel O2 prescription;Verbalizes importance of monitoring SPO2 with pulse oximeter and return demonstration;Maintenance of O2  saturations>88%;Exhibits proper breathing techniques, such as pursed lip breathing or other method taught during program session;Compliance with respiratory medication;Demonstrates proper use of MDI's Exhibits compliance with exercise, home  and travel O2 prescription;Verbalizes importance of monitoring SPO2 with pulse oximeter and return demonstration;Maintenance of O2 saturations>88%;Exhibits proper breathing techniques, such as pursed lip breathing or other method taught during program session;Compliance with respiratory medication;Demonstrates proper use of MDI's Exhibits compliance with exercise, home  and travel O2 prescription;Verbalizes importance of monitoring SPO2 with pulse oximeter and return demonstration;Maintenance of O2 saturations>88%;Exhibits proper breathing techniques, such as pursed lip breathing or other method taught during program session;Compliance with respiratory medication;Demonstrates proper use of MDI's    Comments Reviewed PLB technique with pt.  Talked about how it works and it's importance in maintaining their exercise saturations. Darius Norris is doing well with his oxygen and staying compliant. He is doing well with his inhalers and using his PLB routinely.  He is good about checking his sats and will now start to check it during exercise at home. Darius Norris is doing well with his oxygen.  He does well with his inhalers and feels pretty good breathing wise.  He will only use his nebulizer if feeling bad.  He is pleased that he has not had an exacerbation recently.  His sats are staying consistent as well. Darius Norris continues to have good complinace with his oxygen.  He is doing well wihth his breathing and feels good overall.  He continues to keep a close eye on his sats as well.    Goals/Expected Outcomes Short: Become more profiecient at using PLB.   Long: Become independent at using PLB. Short: Continue to montior oxygen as he adds in more exercise Long: Conitnued compliance Short: Continue to  improve breathing Long; Continue to use oxygen as prescibed. Short: Continue to improve breathing Long; Continue to use oxygen as prescibed.             Oxygen Discharge (Final Oxygen Re-Evaluation):  Oxygen Re-Evaluation - 09/08/21 1119  Program Oxygen Prescription   Program Oxygen Prescription Continuous    Liters per minute 3      Home Oxygen   Home Oxygen Device Home Concentrator;E-Tanks    Sleep Oxygen Prescription Continuous    Liters per minute 3    Home Exercise Oxygen Prescription Continuous    Liters per minute 3    Home Resting Oxygen Prescription Continuous    Liters per minute 3    Compliance with Home Oxygen Use Yes      Goals/Expected Outcomes   Short Term Goals To learn and exhibit compliance with exercise, home and travel O2 prescription;To learn and understand importance of monitoring SPO2 with pulse oximeter and demonstrate accurate use of the pulse oximeter.;To learn and understand importance of maintaining oxygen saturations>88%;To learn and demonstrate proper pursed lip breathing techniques or other breathing techniques. ;To learn and demonstrate proper use of respiratory medications    Long  Term Goals Exhibits compliance with exercise, home  and travel O2 prescription;Verbalizes importance of monitoring SPO2 with pulse oximeter and return demonstration;Maintenance of O2 saturations>88%;Exhibits proper breathing techniques, such as pursed lip breathing or other method taught during program session;Compliance with respiratory medication;Demonstrates proper use of MDI's    Comments Darius Norris continues to have good complinace with his oxygen.  He is doing well wihth his breathing and feels good overall.  He continues to keep a close eye on his sats as well.    Goals/Expected Outcomes Short: Continue to improve breathing Long; Continue to use oxygen as prescibed.             Initial Exercise Prescription:  Initial Exercise Prescription - 06/13/21 1200        Date of Initial Exercise RX and Referring Provider   Date 06/13/21    Referring Provider Jenne Pane, MD ([place])      Oxygen   Oxygen Continuous    Liters 3      Treadmill   MPH 0.8    Grade 0    Minutes 15    METs 1.6      NuStep   Level 1    SPM 80    Minutes 15    METs 1.6      REL-XR   Level 1    Speed 50    Minutes 15    METs 1.6      Prescription Details   Frequency (times per week) 2    Duration Progress to 30 minutes of continuous aerobic without signs/symptoms of physical distress      Intensity   THRR 40-80% of Max Heartrate 102-134    Ratings of Perceived Exertion 11-13    Perceived Dyspnea 0-4      Progression   Progression Continue to progress workloads to maintain intensity without signs/symptoms of physical distress.      Resistance Training   Training Prescription Yes    Weight 3 lb    Reps 10-15             Perform Capillary Blood Glucose checks as needed.  Exercise Prescription Changes:   Exercise Prescription Changes     Row Name 06/13/21 1200 06/29/21 1300 07/07/21 1100 07/12/21 0900 07/27/21 1600     Response to Exercise   Blood Pressure (Admit) 142/80 132/64 -- 138/64 126/62   Blood Pressure (Exercise) 170/80 122/64 -- 164/80 --   Blood Pressure (Exit) 130/82 134/66 -- 126/56 112/56   Heart Rate (Admit) 70 bpm 64 bpm -- 75 bpm 67 bpm  Heart Rate (Exercise) 90 bpm 76 bpm -- 975 bpm 82 bpm   Heart Rate (Exit) 68 bpm 71 bpm -- 70 bpm 69 bpm   Oxygen Saturation (Admit) 94 % 92 % -- 96 % 95 %   Oxygen Saturation (Exercise) 84 % 89 % -- 97 % 96 %   Oxygen Saturation (Exit) 98 % 98 % -- 98 % 97 %   Rating of Perceived Exertion (Exercise) 15 12 -- 12 12   Perceived Dyspnea (Exercise) 3 1 -- 2 2   Symptoms Back pain 6/10, SOB none -- SOB SOB   Comments walk test results first full day of exercise -- -- --   Duration -- Progress to 30 minutes of  aerobic without signs/symptoms of physical distress -- Continue with 30 min of aerobic  exercise without signs/symptoms of physical distress. Continue with 30 min of aerobic exercise without signs/symptoms of physical distress.   Intensity -- THRR unchanged -- THRR unchanged THRR unchanged     Progression   Progression -- Continue to progress workloads to maintain intensity without signs/symptoms of physical distress. -- Continue to progress workloads to maintain intensity without signs/symptoms of physical distress. Continue to progress workloads to maintain intensity without signs/symptoms of physical distress.   Average METs -- 1.52 -- 2.32 2.6     Resistance Training   Training Prescription -- Yes -- Yes Yes   Weight -- 3 lb -- 3 lb 3 lb   Reps -- 10-15 -- 10-15 10-15     Interval Training   Interval Training -- -- -- No No     Oxygen   Oxygen -- Continuous -- Continuous Continuous   Liters -- 3 -- 3 3     Treadmill   MPH -- 1 -- 1 1.2   Grade -- 0 -- 0 0   Minutes -- 15 -- 15 15   METs -- 1.8 -- 1.77 1.92     NuStep   Level -- -- -- 3 --   Minutes -- -- -- 15 --   METs -- -- -- 2 --     REL-XR   Level -- 1 -- 2 2   Minutes -- 15 -- 15 15   METs -- 1.2 -- 3.2 3.3     Home Exercise Plan   Plans to continue exercise at -- -- Home (comment)  walking, treadmill, staff videos Home (comment)  walking, treadmill, staff videos Home (comment)  walking, treadmill, staff videos   Frequency -- -- Add 1 additional day to program exercise sessions. Add 1 additional day to program exercise sessions. Add 1 additional day to program exercise sessions.   Initial Home Exercises Provided -- -- 07/07/21 07/07/21 07/07/21    Row Name 08/08/21 1600 08/24/21 1600 09/05/21 1100 09/20/21 0900       Response to Exercise   Blood Pressure (Admit) 122/76 122/66 126/62 --    Blood Pressure (Exit) 128/70 106/64 120/64 138/68    Heart Rate (Admit) 69 bpm 66 bpm 71 bpm 68 bpm    Heart Rate (Exercise) 84 bpm 81 bpm 79 bpm 78 bpm    Heart Rate (Exit) 69 bpm 72 bpm 72 bpm 69 bpm     Oxygen Saturation (Admit) 98 % 97 % 98 % 98 %    Oxygen Saturation (Exercise) 97 % 98 % 95 % 98 %    Oxygen Saturation (Exit) 98 % 98 % 96 % 99 %    Rating of Perceived Exertion (Exercise) 12  12 13 13     Perceived Dyspnea (Exercise) 1 1 3 3     Symptoms SOB SOB SOB SOB    Duration Continue with 30 min of aerobic exercise without signs/symptoms of physical distress. Continue with 30 min of aerobic exercise without signs/symptoms of physical distress. Continue with 30 min of aerobic exercise without signs/symptoms of physical distress. Continue with 30 min of aerobic exercise without signs/symptoms of physical distress.    Intensity THRR unchanged THRR unchanged THRR unchanged THRR unchanged         Progression   Progression Continue to progress workloads to maintain intensity without signs/symptoms of physical distress. Continue to progress workloads to maintain intensity without signs/symptoms of physical distress. -- Continue to progress workloads to maintain intensity without signs/symptoms of physical distress.    Average METs 2.26 2.5 2.33 2.07         Resistance Training   Training Prescription Yes Yes Yes Yes    Weight 3 lb 3 lb 3 lb 3 lb    Reps 10-15 10-15 10-15 10-15         Interval Training   Interval Training No No No No         Oxygen   Oxygen Continuous Continuous Continuous Continuous    Liters 3 3 2 3          Treadmill   MPH 1.4 1.4 1.4 1.4    Grade 0 0 0 0    Minutes 15 15 15 15     METs 2.07 2.07 -- --         NuStep   Level -- 5 -- --    Minutes -- 15 -- --    METs -- 3 -- --         Recumbant Elliptical   Level 1 -- 1 --    Minutes 15 -- 15 --    METs 1.6 -- 1.6 --         REL-XR   Level 2 -- 2 1    Minutes 15 -- 15 15    METs 3.1 -- 3.6 --         Home Exercise Plan   Plans to continue exercise at Home (comment)  walking, treadmill, staff videos Home (comment)  walking, treadmill, staff videos Home (comment)  walking, treadmill, staff videos  Home (comment)  walking, treadmill, staff videos    Frequency Add 1 additional day to program exercise sessions. Add 1 additional day to program exercise sessions. Add 1 additional day to program exercise sessions. Add 1 additional day to program exercise sessions.    Initial Home Exercises Provided 07/07/21 07/07/21 07/07/21 07/07/21         Oxygen   Maintain Oxygen Saturation -- 88% or higher 88% or higher 88% or higher             Exercise Comments:   Exercise Goals and Review:   Exercise Goals     Row Name 06/13/21 1233             Exercise Goals   Increase Physical Activity Yes       Intervention Provide advice, education, support and counseling about physical activity/exercise needs.;Develop an individualized exercise prescription for aerobic and resistive training based on initial evaluation findings, risk stratification, comorbidities and participant's personal goals.       Expected Outcomes Short Term: Attend rehab on a regular basis to increase amount of physical activity.;Long Term: Add in home exercise to make exercise part of  routine and to increase amount of physical activity.;Long Term: Exercising regularly at least 3-5 days a week.       Increase Strength and Stamina Yes       Intervention Provide advice, education, support and counseling about physical activity/exercise needs.;Develop an individualized exercise prescription for aerobic and resistive training based on initial evaluation findings, risk stratification, comorbidities and participant's personal goals.       Expected Outcomes Short Term: Increase workloads from initial exercise prescription for resistance, speed, and METs.;Short Term: Perform resistance training exercises routinely during rehab and add in resistance training at home;Long Term: Improve cardiorespiratory fitness, muscular endurance and strength as measured by increased METs and functional capacity (6MWT)       Able to understand and use  rate of perceived exertion (RPE) scale Yes       Intervention Provide education and explanation on how to use RPE scale       Expected Outcomes Short Term: Able to use RPE daily in rehab to express subjective intensity level;Long Term:  Able to use RPE to guide intensity level when exercising independently       Able to understand and use Dyspnea scale Yes       Intervention Provide education and explanation on how to use Dyspnea scale       Expected Outcomes Short Term: Able to use Dyspnea scale daily in rehab to express subjective sense of shortness of breath during exertion;Long Term: Able to use Dyspnea scale to guide intensity level when exercising independently       Knowledge and understanding of Target Heart Rate Range (THRR) Yes       Intervention Provide education and explanation of THRR including how the numbers were predicted and where they are located for reference       Expected Outcomes Short Term: Able to state/look up THRR;Short Term: Able to use daily as guideline for intensity in rehab;Long Term: Able to use THRR to govern intensity when exercising independently       Able to check pulse independently Yes       Intervention Provide education and demonstration on how to check pulse in carotid and radial arteries.;Review the importance of being able to check your own pulse for safety during independent exercise       Expected Outcomes Short Term: Able to explain why pulse checking is important during independent exercise;Long Term: Able to check pulse independently and accurately       Understanding of Exercise Prescription Yes       Intervention Provide education, explanation, and written materials on patient's individual exercise prescription       Expected Outcomes Short Term: Able to explain program exercise prescription;Long Term: Able to explain home exercise prescription to exercise independently                Exercise Goals Re-Evaluation :  Exercise Goals  Re-Evaluation     Row Name 06/23/21 1132 06/29/21 1315 07/07/21 1118 07/12/21 0903 07/27/21 1605     Exercise Goal Re-Evaluation   Exercise Goals Review Increase Physical Activity;Able to understand and use rate of perceived exertion (RPE) scale;Knowledge and understanding of Target Heart Rate Range (THRR);Understanding of Exercise Prescription;Increase Strength and Stamina;Able to understand and use Dyspnea scale;Able to check pulse independently Increase Physical Activity;Increase Strength and Stamina Increase Physical Activity;Increase Strength and Stamina;Understanding of Exercise Prescription Increase Physical Activity;Increase Strength and Stamina;Understanding of Exercise Prescription Increase Physical Activity;Increase Strength and Stamina   Comments Reviewed RPE and dyspnea scales,  THR and program prescription with pt today.  Pt voiced understanding and was given a copy of goals to take home. Darius Norris is doing well for his first couple of sessions here at rehab. He has already increased his speed on the treadmill to 1.0 speed. Will continue to monitor for progression. Darius Norris is doing well in rehab.  He has bought a new treadmill to use at home.  He has started some but would like to get into it more.  Reviewed home exercise with pt today.  Pt plans to walking and treadmill for exercise.  We also talked about adding in some staff videos. Reviewed THR, pulse, RPE, sign and symptoms, pulse oximetery and when to call 911 or MD.  Also discussed weather considerations and indoor options.  Pt voiced understanding. Darius Norris is doing well in rehab.  He has completed his first four full days of exercise and is up to 3.2 METs on the XR.  We will continue to monitor his progress. Darius Norris is doing well.  Oxygen stays in the 90s durign sessions.  We will continue to monitor progress.   Expected Outcomes Short: Use RPE daily to regulate intensity. Long: Follow program prescription in THR. Short: Continue to increase loads on  exercise and tolerate full 30 minutes Long: Continue to build overall strength and stamina Short: Start to add in more exercise at home Long: Continue to improve stamina. Short: Try a little incline on treadmill Long: Continue to improve stamina and follow program prescription. Short: attend consistently  Long:  build overll stamina    Row Name 08/08/21 1641 08/09/21 1113 08/24/21 1609 09/05/21 1133 09/08/21 1113     Exercise Goal Re-Evaluation   Exercise Goals Review Increase Physical Activity;Increase Strength and Stamina;Understanding of Exercise Prescription Increase Physical Activity;Increase Strength and Stamina;Understanding of Exercise Prescription Increase Physical Activity;Increase Strength and Stamina Increase Physical Activity;Increase Strength and Stamina;Understanding of Exercise Prescription Increase Physical Activity;Increase Strength and Stamina;Understanding of Exercise Prescription   Comments Darius Norris is doing well in rehab.  He up to 1.4 mph on the treadmill.  We will continue to monitor his progress. Darius Norris is doing well in rehab.  He fell last week and hurting all over from his bruises. He tries to stay busy doing house work on his off days. He was feeling better prior to falling. Darius Norris attends consistently.  His oxygen stays in the 90s.  Staff will encourage trying 4 lb for strength work. Mike's oxygen is well maintained above 88% during his exercise sessions. He should be encouraged to increase his speed on the treadmill as tolerated. RPEs are appropriate. Will continue to monitor. Darius Norris was out earlier this week with bursitis in his elbow.  He is hurting all over this week.  He is doing what he can.  He has not been doing his exercise at home, but is able to stay busier now. He is slowly building back.   Expected Outcomes Short: Try 4 lb weights Long: COntinue to improve stamina Short: Recover from fall and start to add in more at home Long; Conitnue to improve stamina. Short: maintain  consistent attendance Long: build overall stamina Short: Inccrease load on the treadmill Long: Continue to increase overall MET level Short: Continue to move more again Long: continue to improve stamina.    [place] Name 09/20/21 9371 09/20/21 0909           Exercise Goal Re-Evaluation   Exercise Goals Review Increase Physical Activity;Increase Strength and Stamina --  Comments Mike's oxygen stays on the 90s when on 3L/m.  Staff will encourage trying level 3 on XR and 4 lb for strength work.  He would benefit from stretching on days not at [facility]. He has had home exercise and staff will review stretching.      Expected Outcomes -- Short: add stretching at home Long: improve overall stamina               Discharge Exercise Prescription (Final Exercise Prescription Changes):  Exercise Prescription Changes - 09/20/21 0900       Response to Exercise   Blood Pressure (Exit) 138/68    Heart Rate (Admit) 68 bpm    Heart Rate (Exercise) 78 bpm    Heart Rate (Exit) 69 bpm    Oxygen Saturation (Admit) 98 %    Oxygen Saturation (Exercise) 98 %    Oxygen Saturation (Exit) 99 %    Rating of Perceived Exertion (Exercise) 13    Perceived Dyspnea (Exercise) 3    Symptoms SOB    Duration Continue with 30 min of aerobic exercise without signs/symptoms of physical distress.    Intensity THRR unchanged      Progression   Progression Continue to progress workloads to maintain intensity without signs/symptoms of physical distress.    Average METs 2.07      Resistance Training   Training Prescription Yes    Weight 3 lb    Reps 10-15      Interval Training   Interval Training No      Oxygen   Oxygen Continuous    Liters 3      Treadmill   MPH 1.4    Grade 0    Minutes 15      REL-XR   Level 1    Minutes 15      Home Exercise Plan   Plans to continue exercise at Home (comment)   walking, treadmill, staff videos   Frequency Add 1 additional day to program exercise sessions.     Initial Home Exercises Provided 07/07/21      Oxygen   Maintain Oxygen Saturation 88% or higher             Nutrition:  Target Goals: Understanding of nutrition guidelines, daily intake of sodium '1500mg'$ , cholesterol '200mg'$ , calories 30% from fat and 7% or less from saturated fats, daily to have 5 or more servings of fruits and vegetables.  Education: All About Nutrition: -Group instruction provided by verbal, written material, interactive activities, discussions, models, and posters to present general guidelines for heart healthy nutrition including fat, fiber, MyPlate, the role of sodium in heart healthy nutrition, utilization of the nutrition label, and utilization of this knowledge for meal planning. Follow up email sent as well. Written material given at graduation. Flowsheet Row Pulmonary Rehab from 09/15/2021 in [facility]  Date 09/08/21  Educator [facility]  Instruction Review Code 1- Verbalizes Understanding       Biometrics:  Pre Biometrics - 06/13/21 1202       Pre Biometrics   Height 5' 5.75" (1.67 m)    Weight 192 lb 11.2 oz (87.4 kg)    BMI (Calculated) 31.34    Single Leg Stand 1.97 seconds              Nutrition Therapy Plan and Nutrition Goals:  Nutrition Therapy & Goals - 07/05/21 1131       Nutrition Therapy   RD appointment deferred Yes   He would not  like to meet with the RD at this time, will continue to follow up.            Nutrition Assessments:  MEDIFICTS Score Key: ?70 Need to make dietary changes  40-70 Heart Healthy Diet ? 40 Therapeutic Level Cholesterol Diet  Flowsheet Row Pulmonary Rehab from 06/13/2021 in [facility] Cardiac and Pulmonary Rehab  Picture Your Plate Total Score on Admission 60      Picture Your Plate Scores: <52 Unhealthy dietary pattern with much room for improvement. 41-50 Dietary pattern unlikely to meet recommendations for good health and room for improvement. 51-60 More healthful dietary  pattern, with some room for improvement.  >60 Healthy dietary pattern, although there may be some specific behaviors that could be improved.   Nutrition Goals Re-Evaluation:  Nutrition Goals Re-Evaluation     [place] Name 08/09/21 1120 09/08/21 1116           Goals   Nutrition Goal Decrease soda intake, Lose weight Short: Continue to cut back on salt Long: [place] work on weight loss      Comment Darius Norris is doing well in rehab.  He and his wife do not cook much and eat a lot of sandwiches during the week.  He does try to cook more on the weekend.  He is usually eating biscuits for breakfast, but he is good about getting lots of fruit in his diet. He continue to try to make healthier choices.  He has cut back on his salt and knows he could do better.  He has now switched to diet [employer] and is sticking with them. Darius Norris is doing well.  He is doing pretty good with his diet.  He is still eating out a lot but otherwise trying to cut back on his salt.  He is mostly meat and potatoes and occasionally fruit and vegetables.      Expected Outcome Short: Continue to cut back on salt Long: COnitnue work on weight loss Short: Cut back on fast food.  Long: Continue to work on weight loss               Nutrition Goals Discharge (Final Nutrition Goals Re-Evaluation):  Nutrition Goals Re-Evaluation - 09/08/21 1116       Goals   Nutrition Goal Short: Continue to cut back on salt Long: [place] work on weight loss    Comment Darius Norris is doing well.  He is doing pretty good with his diet.  He is still eating out a lot but otherwise trying to cut back on his salt.  He is mostly meat and potatoes and occasionally fruit and vegetables.    Expected Outcome Short: Cut back on fast food.  Long: Continue to work on weight loss             Psychosocial: Target Goals: Acknowledge presence or absence of significant depression and/or stress, maximize coping skills, provide positive support system. Participant is able to  verbalize types and ability to use techniques and skills needed for reducing stress and depression.   Education: Stress, Anxiety, and Depression - Group verbal and visual presentation to define topics covered.  Reviews how body is impacted by stress, anxiety, and depression.  Also discusses healthy ways to reduce stress and to treat/manage anxiety and depression.  Written material given at graduation. Flowsheet Row Pulmonary Rehab from 09/15/2021 in [facility] Cardiac and Pulmonary Rehab  Date 07/28/21  Educator [facility]  Instruction Review Code 1- Verbalizes Understanding       Education:  Sleep Hygiene -Provides group verbal and written instruction about how sleep can affect your health.  Define sleep hygiene, discuss sleep cycles and impact of sleep habits. Review good sleep hygiene tips.    Initial Review & Psychosocial Screening:  Initial Psych Review & Screening - 06/01/21 1557       Initial Review   Current issues with Current Sleep Concerns;Current Stress Concerns    Source of Stress Concerns Chronic Illness    Comments He has panic with shortness of breath at times.      Family Dynamics   Good Support System? Yes    Comments He can look to his wife and his two sisters for support.      Barriers   Psychosocial barriers to participate in program There are no identifiable barriers or psychosocial needs.;The patient should benefit from training in stress management and relaxation.      Screening Interventions   Interventions Encouraged to exercise;To provide support and resources with identified psychosocial needs;Provide feedback about the scores to participant    Expected Outcomes Short Term goal: Utilizing psychosocial counselor, staff and physician to assist with identification of specific Stressors or current issues interfering with healing process. Setting desired goal for each stressor or current issue identified.;Long Term Goal: Stressors or current issues are controlled or  eliminated.;Short Term goal: Identification and review with participant of any Quality of Life or Depression concerns found by scoring the questionnaire.;Long Term goal: The participant improves quality of Life and PHQ9 Scores as seen by post scores and/or verbalization of changes             Quality of Life Scores:  Scores of 19 and below usually indicate a poorer quality of life in these areas.  A difference of  2-3 points is a clinically meaningful difference.  A difference of 2-3 points in the total score of the Quality of Life Index has been associated with significant improvement in overall quality of life, self-image, physical symptoms, and general health in studies assessing change in quality of life.  PHQ-9: Recent Review Flowsheet Data     Depression screen [facility] 2/9 06/13/2021 02/26/2019 01/29/2019 12/23/2018 11/20/2016   Decreased Interest 0 1 0 2 1   Down, Depressed, Hopeless 0 0 0 0 1   PHQ - 2 Score 0 1 0 2 2   Altered sleeping 0 $RemoveBe'2 2 2 1   'PAgwCppzz$ Tired, decreased energy $RemoveBeforeDE'1 2 1 3 2   'hzJbLZiFvlBPmip$ Change in appetite 0 $RemoveBe'1 2 1 1   'yJseFLCGF$ Feeling bad or failure about yourself  0 0 0 0 0   Trouble concentrating 0 0 0 0 0   Moving slowly or fidgety/restless 0 0 0 1 0   Suicidal thoughts 0 0 0 0 0   PHQ-9 Score $RemoveBef'1 6 5 9 6   'QyWWjpIgDm$ Difficult doing work/chores - Somewhat difficult Somewhat difficult Very difficult Very difficult      Interpretation of Total Score  Total Score Depression Severity:  1-4 = Minimal depression, 5-9 = Mild depression, 10-14 = Moderate depression, 15-19 = Moderately severe depression, 20-27 = Severe depression   Psychosocial Evaluation and Intervention:  Psychosocial Evaluation - 06/01/21 1557       Psychosocial Evaluation & Interventions   Interventions Encouraged to exercise with the program and follow exercise prescription;Stress management education;Relaxation education    Comments He can look to his wife and his two sisters for support.He has panic with shortness of breath at times.     Expected Outcomes Short:  Exercise regularly to support mental health and notify staff of any changes. Long: maintain mental health and well being through teaching of rehab or prescribed medications independently.    Continue Psychosocial Services  Follow up required by staff             Psychosocial Re-Evaluation:  Psychosocial Re-Evaluation     [place] Name 07/07/21 1127 08/09/21 1118 09/08/21 1115         Psychosocial Re-Evaluation   Current issues with Current Stress Concerns;Current Sleep Concerns Current Stress Concerns;Current Sleep Concerns Current Stress Concerns;Current Sleep Concerns     Comments Darius Norris is doing well in rehab.  He is feeling pretty good mentally.  His wife had another knee replacement but she is now doing better and is back to work.  He continues to struggle with his sleep.  He gets about 3-4 hrs at night and will take a nap in the afternoon.  He is usually up to 2-3am.  Overall, he is averaging about 6 hrs a day total. Darius Norris is doing well in rehab.  He is holding up fairly well mentally.  He wishes he could see his grandkids again as its been almost a year since he last saw then.  He had a fall out with their mother and has cut them off some.  He is still sleeping about the same and up and down all night.  He does aim for his afternoon nap. Darius Norris is holding up well in rehab. He has been dealing with bursitis this week and hurting all over.  His sleep is about the same.  He still misses his grandkids.     Expected Outcomes Short: Continue to work on sleep  LOng: COntinue to stay positive Short: Continue to try to work on things with seeing grandkids Long: Continue to focus on positive. Short: Continue to try to work on things with seeing grandkids Long: Continue to focus on positive.     Interventions Encouraged to attend Pulmonary Rehabilitation for the exercise Encouraged to attend Pulmonary Rehabilitation for the exercise Encouraged to attend Pulmonary Rehabilitation for  the exercise     Continue Psychosocial Services  -- Follow up required by staff --              Psychosocial Discharge (Final Psychosocial Re-Evaluation):  Psychosocial Re-Evaluation - 09/08/21 1115       Psychosocial Re-Evaluation   Current issues with Current Stress Concerns;Current Sleep Concerns    Comments Darius Norris is holding up well in rehab. He has been dealing with bursitis this week and hurting all over.  His sleep is about the same.  He still misses his grandkids.    Expected Outcomes Short: Continue to try to work on things with seeing grandkids Long: Continue to focus on positive.    Interventions Encouraged to attend Pulmonary Rehabilitation for the exercise             Education: Education Goals: Education classes will be provided on a weekly basis, covering required topics. Participant will state understanding/return demonstration of topics presented.  Learning Barriers/Preferences:  Learning Barriers/Preferences - 06/01/21 1555       Learning Barriers/Preferences   Learning Barriers None    Learning Preferences Group Instruction;Individual Instruction;Pictoral;Skilled Demonstration;Verbal Instruction;Video;Written Material             General Pulmonary Education Topics:  Infection Prevention: - Provides verbal and written material to individual with discussion of infection control including proper hand washing and proper equipment cleaning during exercise session. Flowsheet  Row Pulmonary Rehab from 09/15/2021 in [facility] Cardiac and Pulmonary Rehab  Date 06/01/21  Educator [facility]  Instruction Review Code 1- Verbalizes Understanding       Falls Prevention: - Provides verbal and written material to individual with discussion of falls prevention and safety. Flowsheet Row Pulmonary Rehab from 09/15/2021 in [facility] Cardiac and Pulmonary Rehab  Date 06/01/21  Educator [facility]  Instruction Review Code 1- Verbalizes Understanding       Chronic Lung Disease Review: -  Group verbal instruction with posters, models, PowerPoint presentations and videos,  to review new updates, new respiratory medications, new advancements in procedures and treatments. Providing information on websites and "800" numbers for continued self-education. Includes information about supplement oxygen, available portable oxygen systems, continuous and intermittent flow rates, oxygen safety, concentrators, and Medicare reimbursement for oxygen. Explanation of Pulmonary Drugs, including class, frequency, complications, importance of spacers, rinsing mouth after steroid MDI's, and proper cleaning methods for nebulizers. Review of basic lung anatomy and physiology related to function, structure, and complications of lung disease. Review of risk factors. Discussion about methods for diagnosing sleep apnea and types of masks and machines for OSA. Includes a review of the use of types of environmental controls: home humidity, furnaces, filters, dust mite/pet prevention, HEPA vacuums. Discussion about weather changes, air quality and the benefits of nasal washing. Instruction on Warning signs, infection symptoms, calling MD promptly, preventive modes, and value of vaccinations. Review of effective airway clearance, coughing and/or vibration techniques. Emphasizing that all should Create an Action Plan. Written material given at graduation. Flowsheet Row Pulmonary Rehab from 09/15/2021 in [facility] Cardiac and Pulmonary Rehab  Date 07/21/21  Educator [facility]  Instruction Review Code 1- Verbalizes Understanding       AED/CPR: - Group verbal and written instruction with the use of models to demonstrate the basic use of the AED with the basic ABC's of resuscitation. Flowsheet Row Pulmonary Rehab from 02/26/2019 in [facility] Cardiac and Pulmonary Rehab  Date 01/01/19  Educator [facility]  Instruction Review Code 1- Verbalizes Understanding        Anatomy and Cardiac Procedures: - Group verbal and visual presentation and  models provide information about basic cardiac anatomy and function. Reviews the testing methods done to diagnose heart disease and the outcomes of the test results. Describes the treatment choices: Medical Management, Angioplasty, or Coronary Bypass Surgery for treating various heart conditions including Myocardial Infarction, Angina, Valve Disease, and Cardiac Arrhythmias.  Written material given at graduation.   Medication Safety: - Group verbal and visual instruction to review commonly prescribed medications for heart and lung disease. Reviews the medication, class of the drug, and side effects. Includes the steps to properly store meds and maintain the prescription regimen.  Written material given at graduation. Flowsheet Row Pulmonary Rehab from 09/15/2021 in [facility] Cardiac and Pulmonary Rehab  Date 09/01/21  Educator [facility]  Instruction Review Code 1- Verbalizes Understanding       Other: -Provides group and verbal instruction on various topics (see comments)   Knowledge Questionnaire Score:  Knowledge Questionnaire Score - 06/13/21 1159       Knowledge Questionnaire Score   Pre Score 18/18              Core Components/Risk Factors/Patient Goals at Admission:  Personal Goals and Risk Factors at Admission - 06/13/21 1235       Core Components/Risk Factors/Patient Goals on Admission    Weight Management Yes;Weight Loss    Intervention Weight Management: Develop a combined nutrition and exercise  program designed to reach desired caloric intake, while maintaining appropriate intake of nutrient and fiber, sodium and fats, and appropriate energy expenditure required for the weight goal.;Weight Management: Provide education and appropriate resources to help participant work on and attain dietary goals.;Weight Management/Obesity: Establish reasonable short term and long term weight goals.    Admit Weight 192 lb (87.1 kg)    Goal Weight: Short Term 187 lb (84.8 kg)    Goal Weight: Long  Term 182 lb (82.6 kg)    Expected Outcomes Short Term: Continue to assess and modify interventions until short term weight is achieved;Long Term: Adherence to nutrition and physical activity/exercise program aimed toward attainment of established weight goal;Weight Loss: Understanding of general recommendations for a balanced deficit meal plan, which promotes 1-2 lb weight loss per week and includes a negative energy balance of [phone] kcal/d;Understanding recommendations for meals to include 15-35% energy as protein, 25-35% energy from fat, 35-60% energy from carbohydrates, less than $RemoveB'200mg'KSJveqcb$  of dietary cholesterol, 20-35 gm of total fiber daily;Understanding of distribution of calorie intake throughout the day with the consumption of 4-5 meals/snacks    Improve shortness of breath with ADL's Yes    Intervention Provide education, individualized exercise plan and daily activity instruction to help decrease symptoms of SOB with activities of daily living.    Expected Outcomes Short Term: Improve cardiorespiratory fitness to achieve a reduction of symptoms when performing ADLs;Long Term: Be able to perform more ADLs without symptoms or delay the onset of symptoms    Increase knowledge of respiratory medications and ability to use respiratory devices properly  Yes    Intervention Provide education and demonstration as needed of appropriate use of medications, inhalers, and oxygen therapy.    Expected Outcomes Short Term: Achieves understanding of medications use. Understands that oxygen is a medication prescribed by physician. Demonstrates appropriate use of inhaler and oxygen therapy.;Long Term: Maintain appropriate use of medications, inhalers, and oxygen therapy.    Diabetes Yes    Intervention Provide education about signs/symptoms and action to take for hypo/hyperglycemia.;Provide education about proper nutrition, including hydration, and aerobic/resistive exercise prescription along with prescribed  medications to achieve blood glucose in normal ranges: Fasting glucose 65-99 mg/dL    Expected Outcomes Short Term: Participant verbalizes understanding of the signs/symptoms and immediate care of hyper/hypoglycemia, proper foot care and importance of medication, aerobic/resistive exercise and nutrition plan for blood glucose control.;Long Term: Attainment of HbA1C < 7%.    Hypertension Yes    Intervention Provide education on lifestyle modifcations including regular physical activity/exercise, weight management, moderate sodium restriction and increased consumption of fresh fruit, vegetables, and low fat dairy, alcohol moderation, and smoking cessation.;Monitor prescription use compliance.    Expected Outcomes Short Term: Continued assessment and intervention until BP is < 140/68mm HG in hypertensive participants. < 130/52mm HG in hypertensive participants with diabetes, heart failure or chronic kidney disease.;Long Term: Maintenance of blood pressure at goal levels.    Lipids Yes    Intervention Provide education and support for participant on nutrition & aerobic/resistive exercise along with prescribed medications to achieve LDL '70mg'$ , HDL >$Remo'40mg'leiUi$ .    Expected Outcomes Short Term: Participant states understanding of desired cholesterol values and is compliant with medications prescribed. Participant is following exercise prescription and nutrition guidelines.;Long Term: Cholesterol controlled with medications as prescribed, with individualized exercise RX and with personalized nutrition plan. Value goals: LDL < $Rem'70mg'NpSd$ , HDL > 40 mg.             Education:Diabetes - Individual verbal  and written instruction to review signs/symptoms of diabetes, desired ranges of glucose level fasting, after meals and with exercise. Acknowledge that pre and post exercise glucose checks will be done for 3 sessions at entry of program. Flowsheet Row Pulmonary Rehab from 09/15/2021 in [facility] Cardiac and Pulmonary Rehab  Date  06/01/21  Educator [facility]  Instruction Review Code 1- Verbalizes Understanding       Know Your Numbers and Heart Failure: - Group verbal and visual instruction to discuss disease risk factors for cardiac and pulmonary disease and treatment options.  Reviews associated critical values for Overweight/Obesity, Hypertension, Cholesterol, and Diabetes.  Discusses basics of heart failure: signs/symptoms and treatments.  Introduces Heart Failure Zone chart for action plan for heart failure.  Written material given at graduation. Flowsheet Row Pulmonary Rehab from 09/15/2021 in [facility] Cardiac and Pulmonary Rehab  Date 07/14/21  Educator [facility]  Instruction Review Code 1- Verbalizes Understanding       Core Components/Risk Factors/Patient Goals Review:   Goals and Risk Factor Review     Row Name 07/07/21 1129 08/09/21 1115 09/08/21 1117         Core Components/Risk Factors/Patient Goals Review   Personal Goals Review Weight Management/Obesity;Improve shortness of breath with ADL's;Hypertension;Diabetes;Increase knowledge of respiratory medications and ability to use respiratory devices properly. Weight Management/Obesity;Improve shortness of breath with ADL's;Hypertension;Diabetes;Increase knowledge of respiratory medications and ability to use respiratory devices properly. Weight Management/Obesity;Improve shortness of breath with ADL's;Hypertension;Diabetes;Increase knowledge of respiratory medications and ability to use respiratory devices properly.     Review Darius Norris is doing well in rehab.  He is still try to lose weight.  He admits to the salt being a probelm for him.  His sugars have been doing well overall.  He is trying to keep it under 130 and usually averages between 100-115 mg/dl when he checks it on Sunday mornings.  He is doing well with all his meds and using oxygen and inhalers like he is supposed to.  His pressures have been doing well.  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 Darius Norris tries to stay active at home on his off days, but his breathing continues to limit him in how much he is able to do. He is doing well with taking his meds regularly. His weight is staying under 200  lb.  Today, he is 193 lb and seems to hold steady.  His pressures have been doing well and sugars seem to be good.  He is still waiting to here back from his PCP as they ended up doing more blood work. Darius Norris is doing well in rehab.  His weight is holding steady. He is at 194 today.  His blood pressures and sugars are doing well. He never did hear back from blood work, so he assumes all is well.  He is doing well with his meds and continues to work on his breathing.     Expected Outcomes Short: Continue to check numbers routinely LOng; Continue to cut back on salt Short: Hear back from doctor Long: COntinue to monitor risk factors. Short: Continue to work on breathing and getting active again. Long: Continiue to montior risk factors.              Core Components/Risk Factors/Patient Goals at Discharge (Final Review):   Goals and Risk Factor Review - 09/08/21 1117       Core  Components/Risk Factors/Patient Goals Review   Personal Goals Review Weight Management/Obesity;Improve shortness of breath with ADL's;Hypertension;Diabetes;Increase knowledge of respiratory medications and ability to use respiratory devices properly.    Review Darius Norris is doing well in rehab.  His weight is holding steady. He is at 194 today.  His blood pressures and sugars are doing well. He never did hear back from blood work, so he assumes all is well.  He is doing well with his meds and continues to work on his breathing.    Expected Outcomes Short: Continue to work on breathing and getting active again. Long: Continiue to montior risk factors.             ITP Comments:  ITP Comments     Row Name 06/01/21 1552 06/13/21 1152 06/23/21 1131  06/29/21 1136 07/27/21 0809   ITP Comments Virtual Visit completed. Patient informed on EP and RD appointment and 6 Minute walk test. Patient also informed of patient health questionnaires on My Chart. Patient Verbalizes understanding. Visit diagnosis can be found in [facility]. Patient is [facility]. Completed 6MWT and gym orientation. Initial ITP created and sent for review to Dr.Fuad [facility], Medical Director. First full day of exercise!  Patient was oriented to gym and equipment including functions, settings, policies, and procedures.  Patient's individual exercise prescription and treatment plan were reviewed.  All starting workloads were established based on the results of the 6 minute walk test done at initial orientation visit.  The plan for exercise progression was also introduced and progression will be customized based on patient's performance and goals. 30 Day review completed. Medical Director ITP review done, changes made as directed, and signed approval by Medical Director.    New to program 30 Day review completed. Medical Director ITP review done, changes made as directed, and signed approval by Medical Director.    [place] Name 08/24/21 0737 09/21/21 1353         ITP Comments 30 Day review completed. Medical Director ITP review done, changes made as directed, and signed approval by Medical Director. 30 day review completed. ITP sent to Dr. Zetta Bills, Medical Director of  Pulmonary Rehab. Continue with ITP unless changes are made by physician.               Comments: 30 day review

## 2021-09-22 ENCOUNTER — Other Ambulatory Visit: Payer: Self-pay

## 2021-09-22 DIAGNOSIS — R0602 Shortness of breath: Secondary | ICD-10-CM

## 2021-09-22 DIAGNOSIS — J449 Chronic obstructive pulmonary disease, unspecified: Secondary | ICD-10-CM | POA: Diagnosis not present

## 2021-09-22 NOTE — Progress Notes (Signed)
Daily Session Note  Patient Details  Name: Darius Norris MRN: [id number] Date of Birth: 1951/04/08 Referring Provider:   Flowsheet Row Pulmonary Rehab from 06/13/2021 in [facility] Cardiac and Pulmonary Rehab  Referring Provider Jenne Pane, MD ([place])       Encounter Date: 09/22/2021  Check In:  Session Check In - 09/22/21 1028       Check-In   Supervising physician immediately available to respond to emergencies See telemetry face sheet for immediately available ER MD    Location [facility]-Cardiac & Pulmonary Rehab    Staff Present Birdie Sons, MPA, RN;Jessica [place], MA, RCEP, CCRP, CCET;Amanda Sommer, BA, ACSM CEP, Exercise Physiologist    Virtual Visit No    Medication changes reported     No    Fall or balance concerns reported    No    Tobacco Cessation No Change    Warm-up and Cool-down Performed on first and last piece of equipment    Resistance Training Performed Yes    VAD Patient? No    PAD/SET Patient? No      Pain Assessment   Currently in Pain? No/denies                Social History   Tobacco Use  Smoking Status Former   Packs/day: 1.00   Years: 30.00   Pack years: 30.00   Types: Cigarettes   Quit date: 12/18/2001   Years since quitting: 19.7  Smokeless Tobacco Never    Goals Met:  Independence with exercise equipment Exercise tolerated well No report of concerns or symptoms today Strength training completed today  Goals Unmet:  Not Applicable  Comments: Pt able to follow exercise prescription today without complaint.  Will continue to monitor for progression.    Dr. Emily Filbert is Medical Director for [place].  Dr. Ottie Glazier is Medical Director for [facility] Pulmonary Rehabilitation.

## 2021-09-29 ENCOUNTER — Other Ambulatory Visit: Payer: Self-pay

## 2021-09-29 DIAGNOSIS — J449 Chronic obstructive pulmonary disease, unspecified: Secondary | ICD-10-CM

## 2021-09-29 NOTE — Progress Notes (Signed)
Daily Session Note  Patient Details  Name: Darius Norris MRN: [id number] Date of Birth: 10-15-1951 Referring Provider:   Flowsheet Row Pulmonary Rehab from 06/13/2021 in [facility] Cardiac and Pulmonary Rehab  Referring Provider Jenne Pane, MD ([place])       Encounter Date: 09/29/2021  Check In:  Session Check In - 09/29/21 1106       Check-In   Supervising physician immediately available to respond to emergencies See telemetry face sheet for immediately available ER MD    Location [facility]-Cardiac & Pulmonary Rehab    Staff Present Birdie Sons, MPA, RN;Amanda Sommer, BA, ACSM CEP, Exercise Physiologist;Melissa [place], RDN, LDN;Jessica [place], MA, RCEP, CCRP, CCET;Meredith [place], RN BSN    Virtual Visit No    Medication changes reported     No    Fall or balance concerns reported    No    Tobacco Cessation No Change    Warm-up and Cool-down Performed on first and last piece of equipment    Resistance Training Performed Yes    VAD Patient? No    PAD/SET Patient? No      Pain Assessment   Currently in Pain? No/denies                Social History   Tobacco Use  Smoking Status Former   Packs/day: 1.00   Years: 30.00   Pack years: 30.00   Types: Cigarettes   Quit date: 12/18/2001   Years since quitting: 19.7  Smokeless Tobacco Never    Goals Met:  Independence with exercise equipment Exercise tolerated well No report of concerns or symptoms today Strength training completed today  Goals Unmet:  Not Applicable  Comments: Pt able to follow exercise prescription today without complaint.  Will continue to monitor for progression.    Dr. Emily Filbert is Medical Director for [name].  Dr. Ottie Glazier is Medical Director for [name] Pulmonary Rehabilitation.

## 2021-10-04 ENCOUNTER — Other Ambulatory Visit: Payer: Self-pay

## 2021-10-04 DIAGNOSIS — R0602 Shortness of breath: Secondary | ICD-10-CM

## 2021-10-04 DIAGNOSIS — J449 Chronic obstructive pulmonary disease, unspecified: Secondary | ICD-10-CM

## 2021-10-04 NOTE — Progress Notes (Signed)
Daily Session Note  Patient Details  Name: Darius Norris MRN: [id number] Date of Birth: 04/19/1951 Referring Provider:   Flowsheet Row Pulmonary Rehab from 06/13/2021 in [facility] Cardiac and Pulmonary Rehab  Referring Provider Jenne Pane, MD ([place])       Encounter Date: 10/04/2021  Check In:  Session Check In - 10/04/21 1101       Check-In   Supervising physician immediately available to respond to emergencies See telemetry face sheet for immediately available ER MD    Location [facility]-Cardiac & Pulmonary Rehab    Staff Present Birdie Sons, MPA, RN;Jessica [place], MA, RCEP, CCRP, CCET;Amanda Sommer, BA, ACSM CEP, Exercise Physiologist    Virtual Visit No    Medication changes reported     No    Fall or balance concerns reported    No    Tobacco Cessation No Change    Warm-up and Cool-down Performed on first and last piece of equipment    Resistance Training Performed Yes    VAD Patient? No    PAD/SET Patient? No      Pain Assessment   Currently in Pain? No/denies                Social History   Tobacco Use  Smoking Status Former   Packs/day: 1.00   Years: 30.00   Pack years: 30.00   Types: Cigarettes   Quit date: 12/18/2001   Years since quitting: 19.8  Smokeless Tobacco Never    Goals Met:  Independence with exercise equipment Exercise tolerated well No report of concerns or symptoms today Strength training completed today  Goals Unmet:  Not Applicable  Comments: Pt able to follow exercise prescription today without complaint.  Will continue to monitor for progression.    Dr. Emily Filbert is Medical Director for [place].  Dr. Ottie Glazier is Medical Director for [facility] Pulmonary Rehabilitation.

## 2021-10-11 ENCOUNTER — Other Ambulatory Visit: Payer: Self-pay

## 2021-10-11 DIAGNOSIS — J449 Chronic obstructive pulmonary disease, unspecified: Secondary | ICD-10-CM

## 2021-10-11 DIAGNOSIS — R0602 Shortness of breath: Secondary | ICD-10-CM

## 2021-10-11 NOTE — Progress Notes (Signed)
Daily Session Note  Patient Details  Name: Darius Norris MRN: [id number] Date of Birth: 11-28-1951 Referring Provider:   Flowsheet Row Pulmonary Rehab from 06/13/2021 in [facility] Cardiac and Pulmonary Rehab  Referring Provider Jenne Pane, MD ([place])       Encounter Date: 10/11/2021  Check In:  Session Check In - 10/11/21 1100       Check-In   Supervising physician immediately available to respond to emergencies See telemetry face sheet for immediately available ER MD    Location [facility]-Cardiac & Pulmonary Rehab    Staff Present Birdie Sons, MPA, RN;Amanda Sommer, BA, ACSM CEP, Exercise Physiologist;Melissa [place], RDN, LDN    Virtual Visit No    Medication changes reported     No    Fall or balance concerns reported    No    Tobacco Cessation No Change    Warm-up and Cool-down Performed on first and last piece of equipment    Resistance Training Performed Yes    VAD Patient? No    PAD/SET Patient? No      Pain Assessment   Currently in Pain? No/denies                Social History   Tobacco Use  Smoking Status Former   Packs/day: 1.00   Years: 30.00   Pack years: 30.00   Types: Cigarettes   Quit date: 12/18/2001   Years since quitting: 19.8  Smokeless Tobacco Never    Goals Met:  Independence with exercise equipment Exercise tolerated well No report of concerns or symptoms today Strength training completed today  Goals Unmet:  Not Applicable  Comments: Pt able to follow exercise prescription today without complaint.  Will continue to monitor for progression.    Dr. Emily Filbert is Medical Director for [place].  Dr. Ottie Glazier is Medical Director for [facility] Pulmonary Rehabilitation.

## 2021-10-13 ENCOUNTER — Other Ambulatory Visit: Payer: Self-pay

## 2021-10-13 DIAGNOSIS — J449 Chronic obstructive pulmonary disease, unspecified: Secondary | ICD-10-CM

## 2021-10-13 NOTE — Progress Notes (Signed)
Daily Session Note  Patient Details  Name: Darius Norris MRN: [id number] Date of Birth: [birth year] Referring Provider:   Flowsheet Row Pulmonary Rehab from 06/13/2021 in [facility] Cardiac and Pulmonary Rehab  Referring Provider Jenne Pane, MD ([place])       Encounter Date: 10/13/2021  Check In:  Session Check In - 10/13/21 1014       Check-In   Supervising physician immediately available to respond to emergencies See telemetry face sheet for immediately available ER MD    Location [facility]-Cardiac & Pulmonary Rehab    Staff Present Birdie Sons, MPA, RN;Joseph [place], RCP,RRT,BSRT;Melissa [place], RDN, LDN    Virtual Visit No    Medication changes reported     No    Fall or balance concerns reported    No    Tobacco Cessation No Change    Warm-up and Cool-down Performed on first and last piece of equipment    Resistance Training Performed Yes    VAD Patient? No    PAD/SET Patient? No      Pain Assessment   Currently in Pain? No/denies                Social History   Tobacco Use  Smoking Status Former   Packs/day: 1.00   Years: 30.00   Pack years: 30.00   Types: Cigarettes   Quit date: 12/18/2001   Years since quitting: 19.8  Smokeless Tobacco Never    Goals Met:  Independence with exercise equipment Exercise tolerated well No report of concerns or symptoms today Strength training completed today  Goals Unmet:  Not Applicable  Comments: Pt able to follow exercise prescription today without complaint.  Will continue to monitor for progression.    Dr. Emily Filbert is Medical Director for [place].  Dr. Ottie Glazier is Medical Director for [facility] Pulmonary Rehabilitation.

## 2021-10-18 ENCOUNTER — Encounter: Payer: Medicare HMO | Attending: Anesthesiology

## 2021-10-18 ENCOUNTER — Other Ambulatory Visit: Payer: Self-pay

## 2021-10-18 DIAGNOSIS — R0602 Shortness of breath: Secondary | ICD-10-CM | POA: Insufficient documentation

## 2021-10-18 DIAGNOSIS — J449 Chronic obstructive pulmonary disease, unspecified: Secondary | ICD-10-CM | POA: Insufficient documentation

## 2021-10-18 NOTE — Progress Notes (Signed)
Daily Session Note  Patient Details  Name: Darius Norris MRN: [id number] Date of Birth: 07-27-1951 Referring Provider:   Flowsheet Row Pulmonary Rehab from 06/13/2021 in [facility]  Referring Provider Jenne Pane, MD ([place])       Encounter Date: 10/18/2021  Check In:  Session Check In - 10/18/21 1115       Check-In   Supervising physician immediately available to respond to emergencies See telemetry face sheet for immediately available ER MD    Location [facility] & Pul[facility]    Staff Present Birdie Sons, MPA, RN;Amanda Sommer, BA, ACSM CEP, Exercise Physiologist;Jessica Norris, MA, RCEP, CCRP, CCET    Virtual Visit No    Medication changes reported     No    Fall or balance concerns reported    No    Tobacco Cessation No Change    Warm-up and Cool-down Performed on first and last piece of equipment    Resistance Training Performed Yes    VAD Patient? No    PAD/SET Patient? No      Pain Assessment   Currently in Pain? No/denies                Social History   Tobacco Use  Smoking Status Former   Packs/day: 1.00   Years: 30.00   Pack years: 30.00   Types: Cigarettes   Quit date: 12/18/2001   Years since quitting: 19.8  Smokeless Tobacco Never    Goals Met:  Independence with exercise equipment Exercise tolerated well No report of concerns or symptoms today Strength training completed today  Goals Unmet:  Not Applicable  Comments: Pt able to follow exercise prescription today without complaint.  Will continue to monitor for progression.    Dr. Emily Filbert is Medical Director for [place].  Dr. Ottie Glazier is Medical Director for [facility].

## 2021-10-19 ENCOUNTER — Encounter: Payer: Self-pay | Admitting: *Deleted

## 2021-10-19 DIAGNOSIS — J449 Chronic obstructive pulmonary disease, unspecified: Secondary | ICD-10-CM

## 2021-10-19 NOTE — Progress Notes (Signed)
Pulmonary Individual Treatment Plan  Patient Details  Name: Darius Norris MRN: [id number] Date of Birth: Feb 22, 1951 Referring Provider:   Flowsheet Row Pulmonary Rehab from 06/13/2021 in [facility] Cardiac and Pulmonary Rehab  Referring Provider Jenne Pane, MD ([place])       Initial Encounter Date:  Flowsheet Row Pulmonary Rehab from 06/13/2021 in [facility] Cardiac and Pulmonary Rehab  Date 06/13/21       Visit Diagnosis: Chronic obstructive pulmonary disease, unspecified COPD type ([place])  Patient's Home Medications on Admission:  Current Outpatient Medications:    acetaminophen (TYLENOL) 325 MG tablet, Take 650 mg by mouth every 6 (six) hours as needed.  (Patient not taking: Reported on 06/01/2021), Disp: , Rfl:    acetaminophen (TYLENOL) 325 MG tablet, Take by mouth., Disp: , Rfl:    albuterol (VENTOLIN HFA) 108 (90 Base) MCG/ACT inhaler, Inhale into the lungs., Disp: , Rfl:    Albuterol Sulfate, sensor, (PROAIR DIGIHALER) 108 (90 Base) MCG/ACT AEPB, Inhale into the lungs., Disp: , Rfl:    amitriptyline (ELAVIL) 10 MG tablet, Take 15 mg by mouth at bedtime. , Disp: , Rfl:    amitriptyline (ELAVIL) 10 MG tablet, Take 2 tablets by mouth at bedtime., Disp: , Rfl:    aspirin EC 81 MG EC tablet, Take 1 tablet (81 mg total) by mouth daily., Disp: 180 tablet, Rfl: 0   atorvastatin (LIPITOR) 80 MG tablet, Take 1 tablet (80 mg total) by mouth at bedtime. (Patient not taking: Reported on 06/01/2021), Disp: 30 tablet, Rfl: 0   atorvastatin (LIPITOR) 80 MG tablet, Take by mouth., Disp: , Rfl:    budesonide-formoterol (SYMBICORT) 160-4.5 MCG/ACT inhaler, Inhale 2 puffs into the lungs 2 (two) times daily., Disp: , Rfl:    busPIRone (BUSPAR) 10 MG tablet, Take 10 mg by mouth 2 (two) times daily., Disp: , Rfl:    busPIRone (BUSPAR) 10 MG tablet, Take by mouth. (Patient not taking: Reported on 06/01/2021), Disp: , Rfl:    clobetasol (TEMOVATE) 0.05 % external solution, Apply topically., Disp: , Rfl:    clobetasol  cream (TEMOVATE) 0.62 %, Apply 1 application topically daily as needed. For itching on psoriasis areas, Disp: , Rfl:    clobetasol cream (TEMOVATE) 3.76 %, Apply 1 application topically daily as needed. For itching on psoriasis areas, Disp: , Rfl:    dabigatran (PRADAXA) 150 MG CAPS capsule, Take 150 mg by mouth 2 (two) times daily., Disp: , Rfl:    dabigatran (PRADAXA) 150 MG CAPS capsule, Take by mouth., Disp: , Rfl:    diltiazem (CARDIZEM CD) 180 MG 24 hr capsule, Take 1 capsule (180 mg total) by mouth daily. (Patient not taking: Reported on 06/01/2021), Disp: 90 capsule, Rfl: 0   diltiazem (TIAZAC) 180 MG 24 hr capsule, Take 1 capsule by mouth daily., Disp: , Rfl:    fluticasone (FLONASE) 50 MCG/ACT nasal spray, Place 2 sprays into both nostrils daily., Disp: , Rfl:    folic acid (FOLVITE) 1 MG tablet, Take by mouth., Disp: , Rfl:    gabapentin (NEURONTIN) 100 MG capsule, Take 100 mg by mouth at bedtime. (Patient not taking: Reported on 06/01/2021), Disp: , Rfl:    gabapentin (NEURONTIN) 300 MG capsule, Take by mouth., Disp: , Rfl:    guaifenesin (HUMIBID E) 400 MG TABS tablet, Take by mouth., Disp: , Rfl:    hydrocortisone 2.5 % cream, Apply topically., Disp: , Rfl:    hydrOXYzine (ATARAX/VISTARIL) 25 MG tablet, Take by mouth., Disp: , Rfl:    Ipratropium-Albuterol (COMBIVENT)  20-100 MCG/ACT AERS respimat, Inhale 1 puff into the lungs every 6 (six) hours as needed for wheezing or shortness of breath. (Patient not taking: Reported on 06/01/2021), Disp: , Rfl:    isosorbide mononitrate (IMDUR) 60 MG 24 hr tablet, Take 60 mg by mouth daily. (Patient not taking: Reported on 06/01/2021), Disp: , Rfl:    isosorbide mononitrate (IMDUR) 60 MG 24 hr tablet, Take by mouth., Disp: , Rfl:    lidocaine (XYLOCAINE) 5 % ointment, Apply topically., Disp: , Rfl:    lisinopril (PRINIVIL,ZESTRIL) 40 MG tablet, Take 20 mg by mouth daily.  (Patient not taking: Reported on 06/01/2021), Disp: , Rfl:    lisinopril  (ZESTRIL) 40 MG tablet, Take by mouth., Disp: , Rfl:    loratadine (CLARITIN) 10 MG tablet, Take 10 mg by mouth daily as needed for allergies. , Disp: , Rfl:    loratadine (CLARITIN) 10 MG tablet, Take 1 tablet by mouth daily. (Patient not taking: Reported on 06/01/2021), Disp: , Rfl:    Melatonin 3 MG CAPS, Take 6 mg by mouth., Disp: , Rfl:    metFORMIN (GLUCOPHAGE) 500 MG tablet, Take by mouth. (Patient not taking: Reported on 06/01/2021), Disp: , Rfl:    metFORMIN (GLUCOPHAGE-XR) 500 MG 24 hr tablet, Take by mouth., Disp: , Rfl:    methotrexate (RHEUMATREX) 2.5 MG tablet, Take by mouth., Disp: , Rfl:    metoprolol tartrate (LOPRESSOR) 100 MG tablet, Take 1 tablet (100 mg total) by mouth 2 (two) times daily. (Patient not taking: Reported on 06/01/2021), Disp: 60 tablet, Rfl: 0   metoprolol tartrate (LOPRESSOR) 50 MG tablet, Take by mouth., Disp: , Rfl:    mometasone (ASMANEX) 220 MCG/INH inhaler, Take by mouth., Disp: , Rfl:    omeprazole (PRILOSEC) 20 MG capsule, Take 20 mg by mouth daily. , Disp: , Rfl:    omeprazole (PRILOSEC) 20 MG capsule, Take by mouth., Disp: , Rfl:    potassium chloride (K-DUR,KLOR-CON) 10 MEQ tablet, Take 10 mEq by mouth daily. (Patient not taking: Reported on 06/01/2021), Disp: , Rfl:    senna (SENOKOT) 8.6 MG TABS tablet, Take 1 tablet by mouth daily as needed for mild constipation., Disp: , Rfl:    senna-docusate (SENOKOT-S) 8.6-50 MG tablet, Take 1 tablet by mouth daily. (Patient not taking: Reported on 06/01/2021), Disp: , Rfl:    simethicone (MYLICON) 80 MG chewable tablet, Chew by mouth., Disp: , Rfl:    traZODone (DESYREL) 50 MG tablet, Take 1 tablet (50 mg total) by mouth at bedtime as needed for sleep., Disp: 30 tablet, Rfl: 0   urea (CARMOL) 20 % cream, Apply topically., Disp: , Rfl:   Past Medical History: Past Medical History:  Diagnosis Date   A-fib ([place])    CAD (coronary artery disease)    Colitis    COPD (chronic obstructive pulmonary disease) (HCC)     Emphysema lung (HCC)    GERD (gastroesophageal reflux disease)    Hypertension    MI (myocardial infarction) ([place]) 1999    Tobacco Use: Social History   Tobacco Use  Smoking Status Former   Packs/day: 1.00   Years: 30.00   Pack years: 30.00   Types: Cigarettes   Quit date: 12/18/2001   Years since quitting: 19.8  Smokeless Tobacco Never    Labs: Recent Review Scientist, physiological     Labs for ITP Cardiac and Pulmonary Rehab Latest Ref Rng & Units 02/04/2013 09/04/2016 05/02/2018 01/05/2019   Cholestrol 0 - 200 mg/dL 185 116 - -  LDLCALC 0 - 99 mg/dL 114(H) 51 - -   HDL >40 mg/dL 38(L) 42 - -   Trlycerides <150 mg/dL 166 116 - -   Hemoglobin A1c 4.8 - 5.6 % - 6.4(H) - -   HCO3 20.0 - 28.0 mmol/L - - 36.3(H) 29.6(H)   O2SAT % - - - 81.7        Pulmonary Assessment Scores:  Pulmonary Assessment Scores     Row Name 06/13/21 1157         ADL UCSD   ADL Phase Entry     SOB Score total 41     Rest 0     Walk 1     Stairs 3     Bath 3     Dress 1     Shop 3       CAT Score   CAT Score 15       mMRC Score   mMRC Score 1              UCSD: Self-administered rating of dyspnea associated with activities of daily living (ADLs) 6-point scale (0 = "not at all" to 5 = "maximal or unable to do because of breathlessness")  Scoring Scores range from 0 to 120.  Minimally important difference is 5 units  CAT: CAT can identify the health impairment of COPD patients and is better correlated with disease progression.  CAT has a scoring range of zero to 40. The CAT score is classified into four groups of low (less than 10), medium (10 - 20), high (21-30) and very high (31-40) based on the impact level of disease on health status. A CAT score over 10 suggests significant symptoms.  A worsening CAT score could be explained by an exacerbation, poor medication adherence, poor inhaler technique, or progression of COPD or comorbid conditions.  CAT MCID is 2 points  mMRC: mMRC  (Modified Medical Research Council) Dyspnea Scale is used to assess the degree of baseline functional disability in patients of respiratory disease due to dyspnea. No minimal important difference is established. A decrease in score of 1 point or greater is considered a positive change.   Pulmonary Function Assessment:  Pulmonary Function Assessment - 06/01/21 1555       Breath   Shortness of Breath Yes;Fear of Shortness of Breath;Limiting activity             Exercise Target Goals: Exercise Program Goal: Individual exercise prescription set using results from initial 6 min walk test and THRR while considering  patient's activity barriers and safety.   Exercise Prescription Goal: Initial exercise prescription builds to 30-45 minutes a day of aerobic activity, 2-3 days per week.  Home exercise guidelines will be given to patient during program as part of exercise prescription that the participant will acknowledge.  Education: Aerobic Exercise: - Group verbal and visual presentation on the components of exercise prescription. Introduces F.I.T.T principle from ACSM for exercise prescriptions.  Reviews F.I.T.T. principles of aerobic exercise including progression. Written material given at graduation. Flowsheet Row Pulmonary Rehab from 10/13/2021 in [facility] Cardiac and Pulmonary Rehab  Date 10/13/21  Educator [facility]  Instruction Review Code 1- Verbalizes Understanding       Education: Resistance Exercise: - Group verbal and visual presentation on the components of exercise prescription. Introduces F.I.T.T principle from ACSM for exercise prescriptions  Reviews F.I.T.T. principles of resistance exercise including progression. Written material given at graduation.    Education: Exercise & Equipment Safety: - Individual verbal instruction  and demonstration of equipment use and safety with use of the equipment. Flowsheet Row Pulmonary Rehab from 10/13/2021 in [facility] Cardiac and Pulmonary Rehab   Date 06/01/21  Educator [facility]  Instruction Review Code 1- Verbalizes Understanding       Education: Exercise Physiology & General Exercise Guidelines: - Group verbal and written instruction with models to review the exercise physiology of the cardiovascular system and associated critical values. Provides general exercise guidelines with specific guidelines to those with heart or lung disease.  Flowsheet Row Pulmonary Rehab from 10/13/2021 in [facility] Cardiac and Pulmonary Rehab  Date 08/04/21  Educator [facility]  Instruction Review Code 1- Verbalizes Understanding       Education: Flexibility, Balance, Mind/Body Relaxation: - Group verbal and visual presentation with interactive activity on the components of exercise prescription. Introduces F.I.T.T principle from ACSM for exercise prescriptions. Reviews F.I.T.T. principles of flexibility and balance exercise training including progression. Also discusses the mind body connection.  Reviews various relaxation techniques to help reduce and manage stress (i.e. Deep breathing, progressive muscle relaxation, and visualization). Balance handout provided to take home. Written material given at graduation. Flowsheet Row Pulmonary Rehab from 10/13/2021 in [facility] Cardiac and Pulmonary Rehab  Date 08/25/21  Educator AS  Instruction Review Code 1- Verbalizes Understanding       Activity Barriers & Risk Stratification:  Activity Barriers & Cardiac Risk Stratification - 06/13/21 1203       Activity Barriers & Cardiac Risk Stratification   Activity Barriers Shortness of Breath;Deconditioning;Back Problems;Muscular Weakness;Balance Concerns;Assistive Device;Other (comment)    Comments neuropathy in feet             6 Minute Walk:  6 Minute Walk     Row Name 06/13/21 1206         6 Minute Walk   Phase Initial     Distance 620 feet     Walk Time 4.5 minutes     # of Rest Breaks 3  1:57-2:15; 2:28-3:13; 4:09-4:44     MPH 1.17     METS 1.69      RPE 15     Perceived Dyspnea  3     VO2 Peak 5.93     Symptoms Yes (comment)     Comments Lower back pain 6/10, SOB     Resting HR 70 bpm     Resting BP 142/80     Resting Oxygen Saturation  94 %     Exercise Oxygen Saturation  during 6 min walk 84 %     Max Ex. HR 90 bpm     Max Ex. BP 170/80     2 Minute Post BP 130/82       Interval HR   1 Minute HR 89     2 Minute HR 88     3 Minute HR 88     4 Minute HR 90     5 Minute HR 75     6 Minute HR 85     2 Minute Post HR 68     Interval Heart Rate? Yes       Interval Oxygen   Interval Oxygen? Yes     Baseline Oxygen Saturation % 94 %     1 Minute Oxygen Saturation % 90 %     1 Minute Liters of Oxygen 3 L     2 Minute Oxygen Saturation % 88 %     2 Minute Liters of Oxygen 3 L     3 Minute  Oxygen Saturation % 87 %     3 Minute Liters of Oxygen 3 L     4 Minute Oxygen Saturation % 84 %     4 Minute Liters of Oxygen 3 L     5 Minute Oxygen Saturation % 84 %     5 Minute Liters of Oxygen 3 L     6 Minute Oxygen Saturation % 85 %     6 Minute Liters of Oxygen 3 L     2 Minute Post Oxygen Saturation % 98 %     2 Minute Post Liters of Oxygen 3 L             Oxygen Initial Assessment:  Oxygen Initial Assessment - 06/13/21 1156       Home Oxygen   Home Oxygen Device Home Concentrator;E-Tanks    Sleep Oxygen Prescription Continuous    Liters per minute 3    Home Exercise Oxygen Prescription Continuous    Liters per minute 3    Home Resting Oxygen Prescription Continuous    Liters per minute 3    Compliance with Home Oxygen Use Yes      Initial 6 min Walk   Oxygen Used Continuous    Liters per minute 3      Program Oxygen Prescription   Program Oxygen Prescription Continuous    Liters per minute 3      Intervention   Short Term Goals To learn and exhibit compliance with exercise, home and travel O2 prescription;To learn and understand importance of monitoring SPO2 with pulse oximeter and demonstrate accurate use  of the pulse oximeter.;To learn and understand importance of maintaining oxygen saturations>88%;To learn and demonstrate proper pursed lip breathing techniques or other breathing techniques. ;To learn and demonstrate proper use of respiratory medications    Long  Term Goals Exhibits compliance with exercise, home  and travel O2 prescription;Verbalizes importance of monitoring SPO2 with pulse oximeter and return demonstration;Maintenance of O2 saturations>88%;Exhibits proper breathing techniques, such as pursed lip breathing or other method taught during program session;Compliance with respiratory medication;Demonstrates proper use of MDI's             Oxygen Re-Evaluation:  Oxygen Re-Evaluation     Row Name 06/23/21 1133 07/07/21 1131 08/09/21 1122 09/08/21 1119 10/04/21 1128     Program Oxygen Prescription   Program Oxygen Prescription Continuous Continuous Continuous Continuous Continuous   Liters per minute 3 3 3 3 3      Home Oxygen   Home Oxygen Device Home Concentrator;E-Tanks Home Concentrator;E-Tanks Home Concentrator;E-Tanks Home Concentrator;E-Tanks Home Concentrator;E-Tanks   Sleep Oxygen Prescription Continuous Continuous Continuous Continuous Continuous   Liters per minute 3 3 3 3 3    Home Exercise Oxygen Prescription Continuous Continuous Continuous Continuous Continuous   Liters per minute 3 3 3 3 3    Home Resting Oxygen Prescription Continuous Continuous Continuous Continuous Continuous   Liters per minute 3 3 3 3 3    Compliance with Home Oxygen Use Yes Yes Yes Yes Yes     Goals/Expected Outcomes   Short Term Goals To learn and exhibit compliance with exercise, home and travel O2 prescription;To learn and understand importance of monitoring SPO2 with pulse oximeter and demonstrate accurate use of the pulse oximeter.;To learn and understand importance of maintaining oxygen saturations>88%;To learn and demonstrate proper pursed lip breathing techniques or other breathing  techniques. ;To learn and demonstrate proper use of respiratory medications To learn and exhibit compliance with exercise, home and travel O2 prescription;To learn  and understand importance of monitoring SPO2 with pulse oximeter and demonstrate accurate use of the pulse oximeter.;To learn and understand importance of maintaining oxygen saturations>88%;To learn and demonstrate proper pursed lip breathing techniques or other breathing techniques. ;To learn and demonstrate proper use of respiratory medications To learn and exhibit compliance with exercise, home and travel O2 prescription;To learn and understand importance of monitoring SPO2 with pulse oximeter and demonstrate accurate use of the pulse oximeter.;To learn and understand importance of maintaining oxygen saturations>88%;To learn and demonstrate proper pursed lip breathing techniques or other breathing techniques. ;To learn and demonstrate proper use of respiratory medications To learn and exhibit compliance with exercise, home and travel O2 prescription;To learn and understand importance of monitoring SPO2 with pulse oximeter and demonstrate accurate use of the pulse oximeter.;To learn and understand importance of maintaining oxygen saturations>88%;To learn and demonstrate proper pursed lip breathing techniques or other breathing techniques. ;To learn and demonstrate proper use of respiratory medications To learn and exhibit compliance with exercise, home and travel O2 prescription;To learn and understand importance of monitoring SPO2 with pulse oximeter and demonstrate accurate use of the pulse oximeter.;To learn and understand importance of maintaining oxygen saturations>88%;To learn and demonstrate proper pursed lip breathing techniques or other breathing techniques. ;To learn and demonstrate proper use of respiratory medications   Long  Term Goals Exhibits compliance with exercise, home  and travel O2 prescription;Verbalizes importance of monitoring  SPO2 with pulse oximeter and return demonstration;Maintenance of O2 saturations>88%;Exhibits proper breathing techniques, such as pursed lip breathing or other method taught during program session;Compliance with respiratory medication;Demonstrates proper use of MDI's Exhibits compliance with exercise, home  and travel O2 prescription;Verbalizes importance of monitoring SPO2 with pulse oximeter and return demonstration;Maintenance of O2 saturations>88%;Exhibits proper breathing techniques, such as pursed lip breathing or other method taught during program session;Compliance with respiratory medication;Demonstrates proper use of MDI's Exhibits compliance with exercise, home  and travel O2 prescription;Verbalizes importance of monitoring SPO2 with pulse oximeter and return demonstration;Maintenance of O2 saturations>88%;Exhibits proper breathing techniques, such as pursed lip breathing or other method taught during program session;Compliance with respiratory medication;Demonstrates proper use of MDI's Exhibits compliance with exercise, home  and travel O2 prescription;Verbalizes importance of monitoring SPO2 with pulse oximeter and return demonstration;Maintenance of O2 saturations>88%;Exhibits proper breathing techniques, such as pursed lip breathing or other method taught during program session;Compliance with respiratory medication;Demonstrates proper use of MDI's Exhibits compliance with exercise, home  and travel O2 prescription;Verbalizes importance of monitoring SPO2 with pulse oximeter and return demonstration;Maintenance of O2 saturations>88%;Exhibits proper breathing techniques, such as pursed lip breathing or other method taught during program session;Compliance with respiratory medication;Demonstrates proper use of MDI's   Comments Reviewed PLB technique with pt.  Talked about how it works and it's importance in maintaining their exercise saturations. Darius Norris is doing well with his oxygen and staying  compliant. He is doing well with his inhalers and using his PLB routinely.  He is good about checking his sats and will now start to check it during exercise at home. Darius Norris is doing well with his oxygen.  He does well with his inhalers and feels pretty good breathing wise.  He will only use his nebulizer if feeling bad.  He is pleased that he has not had an exacerbation recently.  His sats are staying consistent as well. Darius Norris continues to have good complinace with his oxygen.  He is doing well wihth his breathing and feels good overall.  He continues to keep a close eye on his  sats as well. Darius Norris continues to have good complinace with his oxygen.  He is doing well wihth his breathing and feels good overall, but he is having difficulty with sinus drainae at night with limits his O2 until he can cough it up. He has not told his doctor yet, but he plans to make an appointment becuase he is having problems with his hip as well.  He continues to keep a close eye on his sats as well with his pulse ox.   Goals/Expected Outcomes Short: Become more profiecient at using PLB.   Long: Become independent at using PLB. Short: Continue to montior oxygen as he adds in more exercise Long: Conitnued compliance Short: Continue to improve breathing Long; Continue to use oxygen as prescibed. Short: Continue to improve breathing Long; Continue to use oxygen as prescibed. Short: Continue to improve breathing, let MD know about sinus buildup Long; Continue to use oxygen as prescibed.            Oxygen Discharge (Final Oxygen Re-Evaluation):  Oxygen Re-Evaluation - 10/04/21 1128       Program Oxygen Prescription   Program Oxygen Prescription Continuous    Liters per minute 3      Home Oxygen   Home Oxygen Device Home Concentrator;E-Tanks    Sleep Oxygen Prescription Continuous    Liters per minute 3    Home Exercise Oxygen Prescription Continuous    Liters per minute 3    Home Resting Oxygen Prescription Continuous     Liters per minute 3    Compliance with Home Oxygen Use Yes      Goals/Expected Outcomes   Short Term Goals To learn and exhibit compliance with exercise, home and travel O2 prescription;To learn and understand importance of monitoring SPO2 with pulse oximeter and demonstrate accurate use of the pulse oximeter.;To learn and understand importance of maintaining oxygen saturations>88%;To learn and demonstrate proper pursed lip breathing techniques or other breathing techniques. ;To learn and demonstrate proper use of respiratory medications    Long  Term Goals Exhibits compliance with exercise, home  and travel O2 prescription;Verbalizes importance of monitoring SPO2 with pulse oximeter and return demonstration;Maintenance of O2 saturations>88%;Exhibits proper breathing techniques, such as pursed lip breathing or other method taught during program session;Compliance with respiratory medication;Demonstrates proper use of MDI's    Comments Darius Norris continues to have good complinace with his oxygen.  He is doing well wihth his breathing and feels good overall, but he is having difficulty with sinus drainae at night with limits his O2 until he can cough it up. He has not told his doctor yet, but he plans to make an appointment becuase he is having problems with his hip as well.  He continues to keep a close eye on his sats as well with his pulse ox.    Goals/Expected Outcomes Short: Continue to improve breathing, let MD know about sinus buildup Long; Continue to use oxygen as prescibed.             Initial Exercise Prescription:  Initial Exercise Prescription - 06/13/21 1200       Date of Initial Exercise RX and Referring Provider   Date 06/13/21    Referring Provider Jenne Pane, [place] ([place])      Oxygen   Oxygen Continuous    Liters 3      Treadmill   MPH 0.8    Grade 0    Minutes 15    METs 1.6      NuStep  Level 1    SPM 80    Minutes 15    METs 1.6      REL-XR   Level 1    Speed 50     Minutes 15    METs 1.6      Prescription Details   Frequency (times per week) 2    Duration Progress to 30 minutes of continuous aerobic without signs/symptoms of physical distress      Intensity   THRR 40-80% of Max Heartrate 102-134    Ratings of Perceived Exertion 11-13    Perceived Dyspnea 0-4      Progression   Progression Continue to progress workloads to maintain intensity without signs/symptoms of physical distress.      Resistance Training   Training Prescription Yes    Weight 3 lb    Reps 10-15             Perform Capillary Blood Glucose checks as needed.  Exercise Prescription Changes:   Exercise Prescription Changes     Row Name 06/13/21 1200 06/29/21 1300 07/07/21 1100 07/12/21 0900 07/27/21 1600     Response to Exercise   Blood Pressure (Admit) 142/80 132/64 -- 138/64 126/62   Blood Pressure (Exercise) 170/80 122/64 -- 164/80 --   Blood Pressure (Exit) 130/82 134/66 -- 126/56 112/56   Heart Rate (Admit) 70 bpm 64 bpm -- 75 bpm 67 bpm   Heart Rate (Exercise) 90 bpm 76 bpm -- 975 bpm 82 bpm   Heart Rate (Exit) 68 bpm 71 bpm -- 70 bpm 69 bpm   Oxygen Saturation (Admit) 94 % 92 % -- 96 % 95 %   Oxygen Saturation (Exercise) 84 % 89 % -- 97 % 96 %   Oxygen Saturation (Exit) 98 % 98 % -- 98 % 97 %   Rating of Perceived Exertion (Exercise) 15 12 -- 12 12   Perceived Dyspnea (Exercise) 3 1 -- 2 2   Symptoms Back pain 6/10, SOB none -- SOB SOB   Comments walk test results first full day of exercise -- -- --   Duration -- Progress to 30 minutes of  aerobic without signs/symptoms of physical distress -- Continue with 30 min of aerobic exercise without signs/symptoms of physical distress. Continue with 30 min of aerobic exercise without signs/symptoms of physical distress.   Intensity -- THRR unchanged -- THRR unchanged THRR unchanged     Progression   Progression -- Continue to progress workloads to maintain intensity without signs/symptoms of physical distress.  -- Continue to progress workloads to maintain intensity without signs/symptoms of physical distress. Continue to progress workloads to maintain intensity without signs/symptoms of physical distress.   Average METs -- 1.52 -- 2.32 2.6     Resistance Training   Training Prescription -- Yes -- Yes Yes   Weight -- 3 lb -- 3 lb 3 lb   Reps -- 10-15 -- 10-15 10-15     Interval Training   Interval Training -- -- -- No No     Oxygen   Oxygen -- Continuous -- Continuous Continuous   Liters -- 3 -- 3 3     Treadmill   MPH -- 1 -- 1 1.2   Grade -- 0 -- 0 0   Minutes -- 15 -- 15 15   METs -- 1.8 -- 1.77 1.92     NuStep   Level -- -- -- 3 --   Minutes -- -- -- 15 --   METs -- -- -- 2 --  REL-XR   Level -- 1 -- 2 2   Minutes -- 15 -- 15 15   METs -- 1.2 -- 3.2 3.3     Home Exercise Plan   Plans to continue exercise at -- -- Home (comment)  walking, treadmill, staff videos Home (comment)  walking, treadmill, staff videos Home (comment)  walking, treadmill, staff videos   Frequency -- -- Add 1 additional day to program exercise sessions. Add 1 additional day to program exercise sessions. Add 1 additional day to program exercise sessions.   Initial Home Exercises Provided -- -- 07/07/21 07/07/21 07/07/21    Row Name 08/08/21 1600 08/24/21 1600 09/05/21 1100 09/20/21 0900 10/03/21 1400     Response to Exercise   Blood Pressure (Admit) 122/76 122/66 126/62 -- 126/80   Blood Pressure (Exit) 128/70 106/64 120/64 138/68 140/68   Heart Rate (Admit) 69 bpm 66 bpm 71 bpm 68 bpm 80 bpm   Heart Rate (Exercise) 84 bpm 81 bpm 79 bpm 78 bpm 95 bpm   Heart Rate (Exit) 69 bpm 72 bpm 72 bpm 69 bpm 72 bpm   Oxygen Saturation (Admit) 98 % 97 % 98 % 98 % 88 %   Oxygen Saturation (Exercise) 97 % 98 % 95 % 98 % 90 %   Oxygen Saturation (Exit) 98 % 98 % 96 % 99 % 97 %   Rating of Perceived Exertion (Exercise) 12 12 13 13 12    Perceived Dyspnea (Exercise) 1 1 3 3 2    Symptoms SOB SOB SOB SOB SOB    Duration Continue with 30 min of aerobic exercise without signs/symptoms of physical distress. Continue with 30 min of aerobic exercise without signs/symptoms of physical distress. Continue with 30 min of aerobic exercise without signs/symptoms of physical distress. Continue with 30 min of aerobic exercise without signs/symptoms of physical distress. Continue with 30 min of aerobic exercise without signs/symptoms of physical distress.   Intensity THRR unchanged THRR unchanged THRR unchanged THRR unchanged THRR unchanged     Progression   Progression Continue to progress workloads to maintain intensity without signs/symptoms of physical distress. Continue to progress workloads to maintain intensity without signs/symptoms of physical distress. -- Continue to progress workloads to maintain intensity without signs/symptoms of physical distress. Continue to progress workloads to maintain intensity without signs/symptoms of physical distress.   Average METs 2.26 2.5 2.33 2.07 2.32     Resistance Training   Training Prescription Yes Yes Yes Yes Yes   Weight 3 lb 3 lb 3 lb 3 lb 4 lb   Reps 10-15 10-15 10-15 10-15 10-15     Interval Training   Interval Training No No No No No     Oxygen   Oxygen Continuous Continuous Continuous Continuous Continuous   Liters 3 3 2 3 3      Treadmill   MPH 1.4 1.4 1.4 1.4 1.4   Grade 0 0 0 0 0   Minutes 15 15 15 15 15    METs 2.07 2.07 -- -- 2.07     NuStep   Level -- 5 -- -- --   Minutes -- 15 -- -- --   METs -- 3 -- -- --     Recumbant Elliptical   Level 1 -- 1 -- 1   Minutes 15 -- 15 -- 15   METs 1.6 -- 1.6 -- 1.6     REL-XR   Level 2 -- 2 1 2    Minutes 15 -- 15 15 15    METs 3.1 --  3.6 -- 2.2     Home Exercise Plan   Plans to continue exercise at Home (comment)  walking, treadmill, staff videos Home (comment)  walking, treadmill, staff videos Home (comment)  walking, treadmill, staff videos Home (comment)  walking, treadmill, staff videos Home  (comment)  walking, treadmill, staff videos   Frequency Add 1 additional day to program exercise sessions. Add 1 additional day to program exercise sessions. Add 1 additional day to program exercise sessions. Add 1 additional day to program exercise sessions. Add 1 additional day to program exercise sessions.   Initial Home Exercises Provided 07/07/21 07/07/21 07/07/21 07/07/21 07/07/21     Oxygen   Maintain Oxygen Saturation -- 88% or higher 88% or higher 88% or higher 88% or higher            Exercise Comments:   Exercise Goals and Review:   Exercise Goals     Row Name 06/13/21 1233             Exercise Goals   Increase Physical Activity Yes       Intervention Provide advice, education, support and counseling about physical activity/exercise needs.;Develop an individualized exercise prescription for aerobic and resistive training based on initial evaluation findings, risk stratification, comorbidities and participant's personal goals.       Expected Outcomes Short Term: Attend rehab on a regular basis to increase amount of physical activity.;Long Term: Add in home exercise to make exercise part of routine and to increase amount of physical activity.;Long Term: Exercising regularly at least 3-5 days a week.       Increase Strength and Stamina Yes       Intervention Provide advice, education, support and counseling about physical activity/exercise needs.;Develop an individualized exercise prescription for aerobic and resistive training based on initial evaluation findings, risk stratification, comorbidities and participant's personal goals.       Expected Outcomes Short Term: Increase workloads from initial exercise prescription for resistance, speed, and METs.;Short Term: Perform resistance training exercises routinely during rehab and add in resistance training at home;Long Term: Improve cardiorespiratory fitness, muscular endurance and strength as measured by increased METs and  functional capacity (6MWT)       Able to understand and use rate of perceived exertion (RPE) scale Yes       Intervention Provide education and explanation on how to use RPE scale       Expected Outcomes Short Term: Able to use RPE daily in rehab to express subjective intensity level;Long Term:  Able to use RPE to guide intensity level when exercising independently       Able to understand and use Dyspnea scale Yes       Intervention Provide education and explanation on how to use Dyspnea scale       Expected Outcomes Short Term: Able to use Dyspnea scale daily in rehab to express subjective sense of shortness of breath during exertion;Long Term: Able to use Dyspnea scale to guide intensity level when exercising independently       Knowledge and understanding of Target Heart Rate Range (THRR) Yes       Intervention Provide education and explanation of THRR including how the numbers were predicted and where they are located for reference       Expected Outcomes Short Term: Able to state/look up THRR;Short Term: Able to use daily as guideline for intensity in rehab;Long Term: Able to use THRR to govern intensity when exercising independently       Able to  check pulse independently Yes       Intervention Provide education and demonstration on how to check pulse in carotid and radial arteries.;Review the importance of being able to check your own pulse for safety during independent exercise       Expected Outcomes Short Term: Able to explain why pulse checking is important during independent exercise;Long Term: Able to check pulse independently and accurately       Understanding of Exercise Prescription Yes       Intervention Provide education, explanation, and written materials on patient's individual exercise prescription       Expected Outcomes Short Term: Able to explain program exercise prescription;Long Term: Able to explain home exercise prescription to exercise independently                 Exercise Goals Re-Evaluation :  Exercise Goals Re-Evaluation     Row Name 06/23/21 1132 06/29/21 1315 07/07/21 1118 07/12/21 0903 07/27/21 1605     Exercise Goal Re-Evaluation   Exercise Goals Review Increase Physical Activity;Able to understand and use rate of perceived exertion (RPE) scale;Knowledge and understanding of Target Heart Rate Range (THRR);Understanding of Exercise Prescription;Increase Strength and Stamina;Able to understand and use Dyspnea scale;Able to check pulse independently Increase Physical Activity;Increase Strength and Stamina Increase Physical Activity;Increase Strength and Stamina;Understanding of Exercise Prescription Increase Physical Activity;Increase Strength and Stamina;Understanding of Exercise Prescription Increase Physical Activity;Increase Strength and Stamina   Comments Reviewed RPE and dyspnea scales, THR and program prescription with pt today.  Pt voiced understanding and was given a copy of goals to take home. Darius Norris is doing well for his first couple of sessions here at rehab. He has already increased his speed on the treadmill to 1.0 speed. Will continue to monitor for progression. Darius Norris is doing well in rehab.  He has bought a new treadmill to use at home.  He has started some but would like to get into it more.  Reviewed home exercise with pt today.  Pt plans to walking and treadmill for exercise.  We also talked about adding in some staff videos. Reviewed THR, pulse, RPE, sign and symptoms, pulse oximetery and when to call 911 or MD.  Also discussed weather considerations and indoor options.  Pt voiced understanding. Darius Norris is doing well in rehab.  He has completed his first four full days of exercise and is up to 3.2 METs on the XR.  We will continue to monitor his progress. Darius Norris is doing well.  Oxygen stays in the 90s durign sessions.  We will continue to monitor progress.   Expected Outcomes Short: Use RPE daily to regulate intensity. Long: Follow program  prescription in THR. Short: Continue to increase loads on exercise and tolerate full 30 minutes Long: Continue to build overall strength and stamina Short: Start to add in more exercise at home Long: Continue to improve stamina. Short: Try a little incline on treadmill Long: Continue to improve stamina and follow program prescription. Short: attend consistently  Long:  build overll stamina    Row Name 08/08/21 1641 08/09/21 1113 08/24/21 1609 09/05/21 1133 09/08/21 1113     Exercise Goal Re-Evaluation   Exercise Goals Review Increase Physical Activity;Increase Strength and Stamina;Understanding of Exercise Prescription Increase Physical Activity;Increase Strength and Stamina;Understanding of Exercise Prescription Increase Physical Activity;Increase Strength and Stamina Increase Physical Activity;Increase Strength and Stamina;Understanding of Exercise Prescription Increase Physical Activity;Increase Strength and Stamina;Understanding of Exercise Prescription   Comments Darius Norris is doing well in rehab.  He up to 1.4  mph on the treadmill.  We will continue to monitor his progress. Darius Norris is doing well in rehab.  He fell last week and hurting all over from his bruises. He tries to stay busy doing house work on his off days. He was feeling better prior to falling. Darius Norris attends consistently.  His oxygen stays in the 90s.  Staff will encourage trying 4 lb for strength work. Darius Norris's oxygen is well maintained above 88% during his exercise sessions. He should be encouraged to increase his speed on the treadmill as tolerated. RPEs are appropriate. Will continue to monitor. Darius Norris was out earlier this week with bursitis in his elbow.  He is hurting all over this week.  He is doing what he can.  He has not been doing his exercise at home, but is able to stay busier now. He is slowly building back.   Expected Outcomes Short: Try 4 lb weights Long: COntinue to improve stamina Short: Recover from fall and start to add in more at home  Long; Conitnue to improve stamina. Short: maintain consistent attendance Long: build overall stamina Short: Inccrease load on the treadmill Long: Continue to increase overall MET level Short: Continue to move more again Long: continue to improve stamina.    [place] Name 09/20/21 4332 09/20/21 0909 10/03/21 1436 10/04/21 1133       Exercise Goal Re-Evaluation   Exercise Goals Review Increase Physical Activity;Increase Strength and Stamina -- Increase Physical Activity;Increase Strength and Stamina;Understanding of Exercise Prescription Increase Physical Activity;Increase Strength and Stamina;Understanding of Exercise Prescription    Comments Darius Norris's oxygen stays on the 90s when on 3L/m.  Staff will encourage trying level 3 on XR and 4 lb for strength work.  He would benefit from stretching on days not at [facility]. He has had home exercise and staff will review stretching. Darius Norris has been doing well in rehab.  He is on level 2 of the XR with 3.3 METs.  We will continue to monitor his progress. Darius Norris reports not exercising outside of rehab. He plans on walking on his treadmill at home after rehab - he plans to do this in the morning after breakfast. He has 3lb weights at home he hasn't used.    Expected Outcomes -- Short: add stretching at home Long: improve overall stamina Short: Continue to move up workloads Long: Continue to improve stamina Short: create home exercise routine to continue after graduating rehab Long: Continue to improve stamina             Discharge Exercise Prescription (Final Exercise Prescription Changes):  Exercise Prescription Changes - 10/03/21 1400       Response to Exercise   Blood Pressure (Admit) 126/80    Blood Pressure (Exit) 140/68    Heart Rate (Admit) 80 bpm    Heart Rate (Exercise) 95 bpm    Heart Rate (Exit) 72 bpm    Oxygen Saturation (Admit) 88 %    Oxygen Saturation (Exercise) 90 %    Oxygen Saturation (Exit) 97 %    Rating of Perceived Exertion (Exercise) 12     Perceived Dyspnea (Exercise) 2    Symptoms SOB    Duration Continue with 30 min of aerobic exercise without signs/symptoms of physical distress.    Intensity THRR unchanged      Progression   Progression Continue to progress workloads to maintain intensity without signs/symptoms of physical distress.    Average METs 2.32      Resistance Training   Training Prescription Yes  Weight 4 lb    Reps 10-15      Interval Training   Interval Training No      Oxygen   Oxygen Continuous    Liters 3      Treadmill   MPH 1.4    Grade 0    Minutes 15    METs 2.07      Recumbant Elliptical   Level 1    Minutes 15    METs 1.6      REL-XR   Level 2    Minutes 15    METs 2.2      Home Exercise Plan   Plans to continue exercise at Home (comment)   walking, treadmill, staff videos   Frequency Add 1 additional day to program exercise sessions.    Initial Home Exercises Provided 07/07/21      Oxygen   Maintain Oxygen Saturation 88% or higher             Nutrition:  Target Goals: Understanding of nutrition guidelines, daily intake of sodium <1575m, cholesterol <204m calories 30% from fat and 7% or less from saturated fats, daily to have 5 or more servings of fruits and vegetables.  Education: All About Nutrition: -Group instruction provided by verbal, written material, interactive activities, discussions, models, and posters to present general guidelines for heart healthy nutrition including fat, fiber, MyPlate, the role of sodium in heart healthy nutrition, utilization of the nutrition label, and utilization of this knowledge for meal planning. Follow up email sent as well. Written material given at graduation. [facility]  Date 09/08/21  Educator [facility] Center[facility]ruction Review Code 1- Verbalizes Understanding       Biometrics:  Pre Biometrics - 06/13/21 1202       Pre Biometrics   Height 5' 5.75" (1.67  m)    Weight 192 lb 11.2 oz (87.4 kg)    BMI (Calculated) 31.34    Single Leg Stand 1.97 seconds              Nutrition Therapy Plan and Nutrition Goals:  Nutrition Therapy & Goals - 10/04/21 1120       Nutrition Therapy   RD appointment deferred Yes   MiRonalee Beltsould still not like to talk to the RD regarding nutrition            Nutrition Assessments:  MEDIFICTS Score Key: ?70 Need to make dietary changes  40-70 Heart Healthy Diet ? 40 Therapeutic Level Cholesterol Diet  [facility]  Picture Your Plate Total Score on Admission 60      Picture Your Plate Scores: <4<77nhealthy dietary pattern with much room for improvement. 41-50 Dietary pattern unlikely to meet recommendations for good health and room for improvement. 51-60 More healthful dietary pattern, with some room for improvement.  >60 Healthy dietary pattern, although there may be some specific behaviors that could be improved.   Nutrition Goals Re-Evaluation:  Nutrition Goals Re-Evaluation     [place] 08/09/21 1120 09/08/21 1116 10/04/21 1122         Goals   Nutrition Goal Decrease soda intake, Lose weight Short: Continue to cut back on salt Long: COnitnue work on weight loss ST: limit fast food to <2x/week LT: limit Na to <1.5g     Comment MiRonalee Beltss doing well in rehab.  He and his wife do not cook much and eat  a lot of sandwiches during the week.  He does try to cook more on the weekend.  He is usually eating biscuits for breakfast, but he is good about getting lots of fruit in his diet. He continue to try to make healthier choices.  He has cut back on his salt and knows he could do better.  He has now switched to diet [employer] and is sticking with them. Darius Norris is doing well.  He is doing pretty good with his diet.  He is still eating out a lot but otherwise trying to cut back on his salt.  He is mostly meat and potatoes and occasionally fruit and  vegetables. Darius Norris reports doing well with his diet and is working on cutting back salt on his own and cutting back fast food, he would still not like to meet with RD for a consultation.     Expected Outcome Short: Continue to cut back on salt Long: COnitnue work on weight loss Short: Cut back on fast food.  Long: Continue to work on weight loss ST: limit fast food to <2x/week LT: limit Na to <1.5g              Nutrition Goals Discharge (Final Nutrition Goals Re-Evaluation):  Nutrition Goals Re-Evaluation - 10/04/21 1122       Goals   Nutrition Goal ST: limit fast food to <2x/week LT: limit Na to <1.5g    Comment Darius Norris reports doing well with his diet and is working on cutting back salt on his own and cutting back fast food, he would still not like to meet with RD for a consultation.    Expected Outcome ST: limit fast food to <2x/week LT: limit Na to <1.5g             Psychosocial: Target Goals: Acknowledge presence or absence of significant depression and/or stress, maximize coping skills, provide positive support system. Participant is able to verbalize types and ability to use techniques and skills needed for reducing stress and depression.   Education: Stress, Anxiety, and Depression - Group verbal and visual presentation to define topics covered.  Reviews how body is impacted by stress, anxiety, and depression.  Also discusses healthy ways to reduce stress and to treat/manage anxiety and depression.  Written material given at graduation. Flowsheet Row Pulmonary Rehab from 10/13/2021 in [facility] and Pulmonary Rehab  Date 07/28/21  Educator [facility]  Instruction Review Code 1- [employer] Understanding       Education: Sleep Hygiene -Provides group verbal and written instruction about how sleep can affect your health.  Define sleep hygiene, discuss sleep cycles and impact of sleep habits. Review good sleep hygiene tips.    Initial Review & Psychosocial Screening:  Initial Psych  Review & Screening - 06/01/21 1557       Initial Review   Current issues with Current Sleep Concerns;Current Stress Concerns    Source of Stress Concerns Chronic Illness    Comments He has panic with shortness of breath at times.      Family Dynamics   Good Support System? Yes    Comments He can look to his wife and his two sisters for support.      Barriers   Psychosocial barriers to participate in program There are no identifiable barriers or psychosocial needs.;The patient should benefit from training in stress management and relaxation.      Screening Interventions   Interventions Encouraged to exercise;To provide support and resources with identified psychosocial needs;Provide feedback about  the scores to participant    Expected Outcomes Short Term goal: Utilizing psychosocial counselor, staff and physician to assist with identification of specific Stressors or current issues interfering with healing process. Setting desired goal for each stressor or current issue identified.;Long Term Goal: Stressors or current issues are controlled or eliminated.;Short Term goal: Identification and review with participant of any Quality of Life or Depression concerns found by scoring the questionnaire.;Long Term goal: The participant improves quality of Life and PHQ9 Scores as seen by post scores and/or verbalization of changes             Quality of Life Scores:  Scores of 19 and below usually indicate a poorer quality of life in these areas.  A difference of  2-3 points is a clinically meaningful difference.  A difference of 2-3 points in the total score of the Quality of Life Index has been associated with significant improvement in overall quality of life, self-image, physical symptoms, and general health in studies assessing change in quality of life.  PHQ-9: Recent Review Flowsheet Data     Depression screen [facility] 2/9 06/13/2021 02/26/2019 01/29/2019 12/23/2018 11/20/2016   Decreased Interest 0  1 0 2 1   Down, Depressed, Hopeless 0 0 0 0 1   PHQ - 2 Score 0 1 0 2 2   Altered sleeping 0 2 2 2 1    Tired, decreased energy 1 2 1 3 2    Change in appetite 0 1 2 1 1    Feeling bad or failure about yourself  0 0 0 0 0   Trouble concentrating 0 0 0 0 0   Moving slowly or fidgety/restless 0 0 0 1 0   Suicidal thoughts 0 0 0 0 0   PHQ-9 Score 1 6 5 9 6    Difficult doing work/chores - Somewhat difficult Somewhat difficult Very difficult Very difficult      Interpretation of Total Score  Total Score Depression Severity:  1-4 = Minimal depression, 5-9 = Mild depression, 10-14 = Moderate depression, 15-19 = Moderately severe depression, 20-27 = Severe depression   Psychosocial Evaluation and Intervention:  Psychosocial Evaluation - 06/01/21 1557       Psychosocial Evaluation & Interventions   Interventions Encouraged to exercise with the program and follow exercise prescription;Stress management education;Relaxation education    Comments He can look to his wife and his two sisters for support.He has panic with shortness of breath at times.    Expected Outcomes Short: Exercise regularly to support mental health and notify staff of any changes. Long: maintain mental health and well being through teaching of rehab or prescribed medications independently.    Continue Psychosocial Services  Follow up required by staff             Psychosocial Re-Evaluation:  Psychosocial Re-Evaluation     [address] Name 07/07/21 1127 08/09/21 1118 09/08/21 1115 10/04/21 1135       Psychosocial Re-Evaluation   Current issues with Current Stress Concerns;Current Sleep Concerns Current Stress Concerns;Current Sleep Concerns Current Stress Concerns;Current Sleep Concerns Current Stress Concerns;Current Sleep Concerns    Comments Darius Norris is doing well in rehab.  He is feeling pretty good mentally.  His wife had another knee replacement but she is now doing better and is back to work.  He continues to struggle with  his sleep.  He gets about 3-4 hrs at night and will take a nap in the afternoon.  He is usually up to 2-3am.  Overall, he is  averaging about 6 hrs a day total. Darius Norris is doing well in rehab.  He is holding up fairly well mentally.  He wishes he could see his grandkids again as its been almost a year since he last saw then.  He had a fall out with their mother and has cut them off some.  He is still sleeping about the same and up and down all night.  He does aim for his afternoon nap. Darius Norris is holding up well in rehab. He has been dealing with bursitis this week and hurting all over.  His sleep is about the same.  He still misses his grandkids. He reports no stress right. He has bought his wife a new car Friday. He is still dealing with the bursitis, but it is not as bad. He still has not seen his grandchildren stay in touch on FB and now he has a new great-grandchild. He continues to sleep the same as he has been - he gets 4 hours continuous sleep per night, but still manages to get a full 8 hours.    Expected Outcomes Short: Continue to work on sleep  LOng: COntinue to stay positive Short: Continue to try to work on things with seeing grandkids Long: Continue to focus on positive. Short: Continue to try to work on things with seeing grandkids Long: Continue to focus on positive. Short: Continue to stay positive and stay in contact with family Long: Continue to focus on positive.    Interventions Encouraged to attend Pulmonary Rehabilitation for the exercise Encouraged to attend Pulmonary Rehabilitation for the exercise Encouraged to attend Pulmonary Rehabilitation for the exercise Encouraged to attend Pulmonary Rehabilitation for the exercise    Continue Psychosocial Services  -- Follow up required by staff -- Follow up required by staff      Initial Review   Source of Stress Concerns -- -- -- Chronic Illness             Psychosocial Discharge (Final Psychosocial Re-Evaluation):  Psychosocial  Re-Evaluation - 10/04/21 1135       Psychosocial Re-Evaluation   Current issues with Current Stress Concerns;Current Sleep Concerns    Comments He reports no stress right. He has bought his wife a new car Friday. He is still dealing with the bursitis, but it is not as bad. He still has not seen his grandchildren stay in touch on FB and now he has a new great-grandchild. He continues to sleep the same as he has been - he gets 4 hours continuous sleep per night, but still manages to get a full 8 hours.    Expected Outcomes Short: Continue to stay positive and stay in contact with family Long: Continue to focus on positive.    Interventions Encouraged to attend Pulmonary Rehabilitation for the exercise    Continue Psychosocial Services  Follow up required by staff      Initial Review   Source of Stress Concerns Chronic Illness             Education: Education Goals: Education classes will be provided on a weekly basis, covering required topics. Participant will state understanding/return demonstration of topics presented.  Learning Barriers/Preferences:  Learning Barriers/Preferences - 06/01/21 1555       Learning Barriers/Preferences   Learning Barriers None    Learning Preferences Group Instruction;Individual Instruction;Pictoral;Skilled Demonstration;Verbal Instruction;Video;Written Material             General Pulmonary Education Topics:  Infection Prevention: - Provides verbal and written material to  individual with discussion of infection control including proper hand washing and proper equipment cleaning during exercise session. Flowsheet Row Pulmonary Rehab from 10/13/2021 in [facility] Cardiac and Pulmonary Rehab  Date 06/01/21  Educator [facility]  Instruction Review Code 1- Verbalizes Understanding       Falls Prevention: - Provides verbal and written material to individual with discussion of falls prevention and safety. Flowsheet Row Pulmonary Rehab from 10/13/2021 in  [facility] Cardiac and Pulmonary Rehab  Date 06/01/21  Educator [facility]  Instruction Review Code 1- Verbalizes Understanding       Chronic Lung Disease Review: - Group verbal instruction with posters, models, PowerPoint presentations and videos,  to review new updates, new respiratory medications, new advancements in procedures and treatments. Providing information on websites and "800" numbers for continued self-education. Includes information about supplement oxygen, available portable oxygen systems, continuous and intermittent flow rates, oxygen safety, concentrators, and Medicare reimbursement for oxygen. Explanation of Pulmonary Drugs, including class, frequency, complications, importance of spacers, rinsing mouth after steroid MDI's, and proper cleaning methods for nebulizers. Review of basic lung anatomy and physiology related to function, structure, and complications of lung disease. Review of risk factors. Discussion about methods for diagnosing sleep apnea and types of masks and machines for OSA. Includes a review of the use of types of environmental controls: home humidity, furnaces, filters, dust mite/pet prevention, HEPA vacuums. Discussion about weather changes, air quality and the benefits of nasal washing. Instruction on Warning signs, infection symptoms, calling MD promptly, preventive modes, and value of vaccinations. Review of effective airway clearance, coughing and/or vibration techniques. Emphasizing that all should Create an Action Plan. Written material given at graduation. Flowsheet Row Pulmonary Rehab from 10/13/2021 in [facility] Cardiac and Pulmonary Rehab  Date 09/22/21  Educator [facility]  Instruction Review Code 1- Verbalizes Understanding       AED/CPR: - Group verbal and written instruction with the use of models to demonstrate the basic use of the AED with the basic ABC's of resuscitation. Flowsheet Row Pulmonary Rehab from 02/26/2019 in [facility] Cardiac and Pulmonary Rehab  Date 01/01/19   Educator [facility]  Instruction Review Code 1- Verbalizes Understanding        Anatomy and Cardiac Procedures: - Group verbal and visual presentation and models provide information about basic cardiac anatomy and function. Reviews the testing methods done to diagnose heart disease and the outcomes of the test results. Describes the treatment choices: Medical Management, Angioplasty, or Coronary Bypass Surgery for treating various heart conditions including Myocardial Infarction, Angina, Valve Disease, and Cardiac Arrhythmias.  Written material given at graduation.   Medication Safety: - Group verbal and visual instruction to review commonly prescribed medications for heart and lung disease. Reviews the medication, class of the drug, and side effects. Includes the steps to properly store meds and maintain the prescription regimen.  Written material given at graduation. Flowsheet Row Pulmonary Rehab from 10/13/2021 in [facility] Cardiac and Pulmonary Rehab  Date 09/01/21  Educator [facility]  Instruction Review Code 1- Verbalizes Understanding       Other: -Provides group and verbal instruction on various topics (see comments)   Knowledge Questionnaire Score:  Knowledge Questionnaire Score - 06/13/21 1159       Knowledge Questionnaire Score   Pre Score 18/18              Core Components/Risk Factors/Patient Goals at Admission:  Personal Goals and Risk Factors at Admission - 06/13/21 1235       Core Components/Risk Factors/Patient Goals on Admission  Weight Management Yes;Weight Loss    Intervention Weight Management: Develop a combined nutrition and exercise program designed to reach desired caloric intake, while maintaining appropriate intake of nutrient and fiber, sodium and fats, and appropriate energy expenditure required for the weight goal.;Weight Management: Provide education and appropriate resources to help participant work on and attain dietary goals.;Weight Management/Obesity:  Establish reasonable short term and long term weight goals.    Admit Weight 192 lb (87.1 kg)    Goal Weight: Short Term 187 lb (84.8 kg)    Goal Weight: Long Term 182 lb (82.6 kg)    Expected Outcomes Short Term: Continue to assess and modify interventions until short term weight is achieved;Long Term: Adherence to nutrition and physical activity/exercise program aimed toward attainment of established weight goal;Weight Loss: Understanding of general recommendations for a balanced deficit meal plan, which promotes 1-2 lb weight loss per week and includes a negative energy balance of [phone] kcal/d;Understanding recommendations for meals to include 15-35% energy as protein, 25-35% energy from fat, 35-60% energy from carbohydrates, less than 225m of dietary cholesterol, 20-35 gm of total fiber daily;Understanding of distribution of calorie intake throughout the day with the consumption of 4-5 meals/snacks    Improve shortness of breath with ADL's Yes    Intervention Provide education, individualized exercise plan and daily activity instruction to help decrease symptoms of SOB with activities of daily living.    Expected Outcomes Short Term: Improve cardiorespiratory fitness to achieve a reduction of symptoms when performing ADLs;Long Term: Be able to perform more ADLs without symptoms or delay the onset of symptoms    Increase knowledge of respiratory medications and ability to use respiratory devices properly  Yes    Intervention Provide education and demonstration as needed of appropriate use of medications, inhalers, and oxygen therapy.    Expected Outcomes Short Term: Achieves understanding of medications use. Understands that oxygen is a medication prescribed by physician. Demonstrates appropriate use of inhaler and oxygen therapy.;Long Term: Maintain appropriate use of medications, inhalers, and oxygen therapy.    Diabetes Yes    Intervention Provide education about signs/symptoms and action to  take for hypo/hyperglycemia.;Provide education about proper nutrition, including hydration, and aerobic/resistive exercise prescription along with prescribed medications to achieve blood glucose in normal ranges: Fasting glucose 65-99 mg/dL    Expected Outcomes Short Term: Participant verbalizes understanding of the signs/symptoms and immediate care of hyper/hypoglycemia, proper foot care and importance of medication, aerobic/resistive exercise and nutrition plan for blood glucose control.;Long Term: Attainment of HbA1C < 7%.    Hypertension Yes    Intervention Provide education on lifestyle modifcations including regular physical activity/exercise, weight management, moderate sodium restriction and increased consumption of fresh fruit, vegetables, and low fat dairy, alcohol moderation, and smoking cessation.;Monitor prescription use compliance.    Expected Outcomes Short Term: Continued assessment and intervention until BP is < 140/949mHG in hypertensive participants. < 130/8052mG in hypertensive participants with diabetes, heart failure or chronic kidney disease.;Long Term: Maintenance of blood pressure at goal levels.    Lipids Yes    Intervention Provide education and support for participant on nutrition & aerobic/resistive exercise along with prescribed medications to achieve LDL <25m59mDL >40mg59m Expected Outcomes Short Term: Participant states understanding of desired cholesterol values and is compliant with medications prescribed. Participant is following exercise prescription and nutrition guidelines.;Long Term: Cholesterol controlled with medications as prescribed, with individualized exercise RX and with personalized nutrition plan. Value goals: LDL < 25mg,26m > 40 mg.  Education:Diabetes - Individual verbal and written instruction to review signs/symptoms of diabetes, desired ranges of glucose level fasting, after meals and with exercise. Acknowledge that pre and post  exercise glucose checks will be done for 3 sessions at entry of program. Flowsheet Row Pulmonary Rehab from 10/13/2021 in [facility] Cardiac and Pulmonary Rehab  Date 06/01/21  Educator [facility]  Instruction Review Code 1- Verbalizes Understanding       Know Your Numbers and Heart Failure: - Group verbal and visual instruction to discuss disease risk factors for cardiac and pulmonary disease and treatment options.  Reviews associated critical values for Overweight/Obesity, Hypertension, Cholesterol, and Diabetes.  Discusses basics of heart failure: signs/symptoms and treatments.  Introduces Heart Failure Zone chart for action plan for heart failure.  Written material given at graduation. Flowsheet Row Pulmonary Rehab from 10/13/2021 in [facility] Cardiac and Pulmonary Rehab  Date 07/14/21  Educator [facility]  Instruction Review Code 1- Verbalizes Understanding       Core Components/Risk Factors/Patient Goals Review:   Goals and Risk Factor Review     Row Name 07/07/21 1129 08/09/21 1115 09/08/21 1117 10/04/21 1130       Core Components/Risk Factors/Patient Goals Review   Personal Goals Review Weight Management/Obesity;Improve shortness of breath with ADL's;Hypertension;Diabetes;Increase knowledge of respiratory medications and ability to use respiratory devices properly. Weight Management/Obesity;Improve shortness of breath with ADL's;Hypertension;Diabetes;Increase knowledge of respiratory medications and ability to use respiratory devices properly. Weight Management/Obesity;Improve shortness of breath with ADL's;Hypertension;Diabetes;Increase knowledge of respiratory medications and ability to use respiratory devices properly. Weight Management/Obesity;Improve shortness of breath with ADL's;Hypertension;Diabetes;Increase knowledge of respiratory medications and ability to use respiratory devices properly.    Review Darius Norris is doing well in rehab.  He is still try to lose weight.  He admits to the salt being a  probelm for him.  His sugars have been doing well overall.  He is trying to keep it under 130 and usually averages between 100-115 mg/dl when he checks it on Sunday mornings.  He is doing well with all his meds and using oxygen and inhalers like he is supposed to.  His pressures have been doing well.  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 Darius Norris tries to stay active at home on his off days, but his breathing continues to limit him in how much he is able to do. He is doing well with taking his meds regularly. His weight is staying under 200  lb.  Today, he is 193 lb and seems to hold steady.  His pressures have been doing well and sugars seem to be good.  He is still waiting to here back from his PCP as they ended up doing more blood work. Darius Norris is doing well in rehab.  His weight is holding steady. He is at 194 today.  His blood pressures and sugars are doing well. He never did hear back from blood work, so he assumes all is well.  He is doing well with his meds and continues to work on his breathing. He is doing well wihth his breathing and feels good overall, but he is having difficulty with sinus drainae at night with limits his O2 until he can cough it up. He has not told his doctor yet, but he plans to make an appointment becuase he is having problems with his hip as well. He is going for another COVID booster and  needs his flu shot. His weight continues to maintain at around 190lbs, today 192.8lbs. He checks his BP from time to time, but had a dizzy spell last weekend which scared him how suddenly it came on - he did not check his BP or BG. His BG last Sunday was 99 - he does not take it regularly. He continues taking his medications as directed with no issues.    Expected Outcomes Short: Continue to check numbers routinely LOng; Continue to cut back on salt Short: Hear back from doctor Long: COntinue to  monitor risk factors. Short: Continue to work on breathing and getting active again. Long: Continiue to montior risk factors. Short: take BP and BG regularly, make MD appointment for dizzy spells and sinus drainage. Long: Continiue to montior risk factors.             Core Components/Risk Factors/Patient Goals at Discharge (Final Review):   Goals and Risk Factor Review - 10/04/21 1130       Core Components/Risk Factors/Patient Goals Review   Personal Goals Review Weight Management/Obesity;Improve shortness of breath with ADL's;Hypertension;Diabetes;Increase knowledge of respiratory medications and ability to use respiratory devices properly.    Review He is doing well wihth his breathing and feels good overall, but he is having difficulty with sinus drainae at night with limits his O2 until he can cough it up. He has not told his doctor yet, but he plans to make an appointment becuase he is having problems with his hip as well. He is going for another COVID booster and needs his flu shot. His weight continues to maintain at around 190lbs, today 192.8lbs. He checks his BP from time to time, but had a dizzy spell last weekend which scared him how suddenly it came on - he did not check his BP or BG. His BG last Sunday was 99 - he does not take it regularly. He continues taking his medications as directed with no issues.    Expected Outcomes Short: take BP and BG regularly, make MD appointment for dizzy spells and sinus drainage. Long: Continiue to montior risk factors.             ITP Comments:  ITP Comments     Row Name 06/01/21 1552 06/13/21 1152 06/23/21 1131 06/29/21 1136 07/27/21 0809   ITP Comments Virtual Visit completed. Patient informed on EP and RD appointment and 6 Minute walk test. Patient also informed of patient health questionnaires on My Chart. Patient Verbalizes understanding. Visit diagnosis can be found in [facility]. Patient is [facility]. Completed 6MWT and gym orientation.  Initial ITP created and sent for review to Dr.Fuad [place] [facility], Medical Director. First full day of exercise!  Patient was oriented to gym and equipment including functions, settings, policies, and procedures.  Patient's individual exercise prescription and treatment plan were reviewed.  All starting workloads were established based on the results of the 6 minute walk test done at initial orientation visit.  The plan for exercise progression was also introduced and progression will be customized based on patient's performance and goals. 30 Day review completed. Medical Director ITP review done, changes made as directed, and signed approval by Medical Director.    New to program 30 Day review completed. Medical Director ITP review done, changes made as directed, and signed approval by Medical Director.    [place] Name 08/24/21 0737 09/21/21 1353 10/19/21 0759       ITP Comments 30 Day review completed. Medical Director ITP review done, changes  made as directed, and signed approval by Medical Director. 30 day review completed. ITP sent to Dr. Zetta Bills, Medical Director of  Pulmonary Rehab. Continue with ITP unless changes are made by physician. 30 Day review completed. Medical Director ITP review done, changes made as directed, and signed approval by Medical Director.              Comments:  30 Day review completed. Medical Director ITP review done, changes made as directed, and signed approval by Medical Director.

## 2021-10-25 ENCOUNTER — Other Ambulatory Visit: Payer: Self-pay

## 2021-10-25 DIAGNOSIS — J449 Chronic obstructive pulmonary disease, unspecified: Secondary | ICD-10-CM

## 2021-10-25 DIAGNOSIS — R0602 Shortness of breath: Secondary | ICD-10-CM

## 2021-10-25 NOTE — Progress Notes (Addendum)
Daily Session Note  Patient Details  Name: Darius Norris MRN: [id number] Date of Birth: [birth year] Referring Provider:   Flowsheet Row Pulmonary Rehab from 06/13/2021 in [facility] Cardiac and Pulmonary Rehab  Referring Provider Jenne Pane, MD ([place])       Encounter Date: 10/25/2021  Check In:  Session Check In - 10/25/21 1047       Check-In   Supervising physician immediately available to respond to emergencies See telemetry face sheet for immediately available ER MD    Location [facility]-Cardiac & Pulmonary Rehab    Staff Present Birdie Sons, MPA, RN;Melissa [place], RDN, Rowe Pavy, BA, ACSM CEP, Exercise Physiologist    Virtual Visit No    Medication changes reported     Yes    Comments added lopressor 55m    Fall or balance concerns reported    No    Tobacco Cessation No Change    Warm-up and Cool-down Performed on first and last piece of equipment    Resistance Training Performed Yes    VAD Patient? No    PAD/SET Patient? No      Pain Assessment   Currently in Pain? No/denies                Social History   Tobacco Use  Smoking Status Former   Packs/day: 1.00   Years: 30.00   Pack years: 30.00   Types: Cigarettes   Quit date: 12/18/2001   Years since quitting: 19.8  Smokeless Tobacco Never    Goals Met:  Independence with exercise equipment Exercise tolerated well No report of concerns or symptoms today Strength training completed today  Goals Unmet:  Not Applicable  Comments: Pt able to follow exercise prescription today without complaint.  Will continue to monitor for progression.    Dr. MEmily Filbertis Medical Director for [facility]  Dr. FOttie Glazieris Medical Director for [facility].

## 2021-10-27 ENCOUNTER — Other Ambulatory Visit: Payer: Self-pay

## 2021-10-27 DIAGNOSIS — J449 Chronic obstructive pulmonary disease, unspecified: Secondary | ICD-10-CM | POA: Diagnosis not present

## 2021-10-27 NOTE — Patient Instructions (Signed)
Discharge Patient Instructions  Patient Details  Name: Darius Norris MRN: [id number] Date of Birth: 21-Sep-1951 Referring Provider:  Lawrence Marseilles, MD   Number of Visits: 16  Reason for Discharge:  Patient reached a stable level of exercise. Patient independent in their exercise. Patient has met program and personal goals.  Smoking History:  Social History   Tobacco Use  Smoking Status Former   Packs/day: 1.00   Years: 30.00   Pack years: 30.00   Types: Cigarettes   Quit date: 12/18/2001   Years since quitting: 19.8  Smokeless Tobacco Never    Diagnosis:  Chronic obstructive pulmonary disease, unspecified COPD type ([place])  Initial Exercise Prescription:  Initial Exercise Prescription - 06/13/21 1200       Date of Initial Exercise RX and Referring Provider   Date 06/13/21    Referring Provider Jenne Pane, [place] ([place])      Oxygen   Oxygen Continuous    Liters 3      Treadmill   MPH 0.8    Grade 0    Minutes 15    METs 1.6      NuStep   Level 1    SPM 80    Minutes 15    METs 1.6      REL-XR   Level 1    Speed 50    Minutes 15    METs 1.6      Prescription Details   Frequency (times per week) 2    Duration Progress to 30 minutes of continuous aerobic without signs/symptoms of physical distress      Intensity   THRR 40-80% of Max Heartrate 102-134    Ratings of Perceived Exertion 11-13    Perceived Dyspnea 0-4      Progression   Progression Continue to progress workloads to maintain intensity without signs/symptoms of physical distress.      Resistance Training   Training Prescription Yes    Weight 3 lb    Reps 10-15             Discharge Exercise Prescription (Final Exercise Prescription Changes):  Exercise Prescription Changes - 10/19/21 1300       Response to Exercise   Blood Pressure (Admit) 138/68    Blood Pressure (Exit) 122/60    Heart Rate (Admit) 72 bpm    Heart Rate (Exercise) 78 bpm    Heart Rate (Exit) 67 bpm    Oxygen  Saturation (Admit) 97 %    Oxygen Saturation (Exercise) 95 %    Oxygen Saturation (Exit) 95 %    Rating of Perceived Exertion (Exercise) 13    Perceived Dyspnea (Exercise) 1    Duration Continue with 30 min of aerobic exercise without signs/symptoms of physical distress.    Intensity THRR unchanged      Progression   Progression Continue to progress workloads to maintain intensity without signs/symptoms of physical distress.    Average METs 2.74      Resistance Training   Training Prescription Yes    Weight 4 lb    Reps 10-15      Interval Training   Interval Training No      Oxygen   Oxygen Continuous    Liters 3      Treadmill   MPH 1.4    Grade 0    Minutes 15    METs 2.07      REL-XR   Level 2    Minutes 15    METs 3.4  Home Exercise Plan   Plans to continue exercise at Home (comment)   walking, treadmill, staff videos   Frequency Add 1 additional day to program exercise sessions.    Initial Home Exercises Provided 07/07/21      Oxygen   Maintain Oxygen Saturation 88% or higher             Functional Capacity:  6 Minute Walk     Row Name 06/13/21 1206 10/27/21 1145       6 Minute Walk   Phase Initial Discharge    Distance 620 feet 762 feet    Distance % Change -- 22.9 %    Distance Feet Change -- 142 ft    Walk Time 4.5 minutes 5.75 minutes    # of Rest Breaks 3  1:57-2:15; 2:28-3:13; 4:09-4:44 1    MPH 1.17 1.5    METS 1.69 1.63    RPE 15 12    Perceived Dyspnea  3 2    VO2 Peak 5.93 5.74    Symptoms Yes (comment) Yes (comment)    Comments Lower back pain 6/10, SOB hip pain 3/0    Resting HR 70 bpm 66 bpm    Resting BP 142/80 124/62    Resting Oxygen Saturation  94 % 98 %    Exercise Oxygen Saturation  during 6 min walk 84 % 92 %    Max Ex. HR 90 bpm 91 bpm    Max Ex. BP 170/80 126/64    2 Minute Post BP 130/82 124/60      Interval HR   1 Minute HR 89 66    2 Minute HR 88 82    3 Minute HR 88 93    4 Minute HR 90 87    5  Minute HR 75 90    6 Minute HR 85 91    2 Minute Post HR 68 74    Interval Heart Rate? Yes Yes      Interval Oxygen   Interval Oxygen? Yes Yes    Baseline Oxygen Saturation % 94 % 98 %    1 Minute Oxygen Saturation % 90 % 98 %    1 Minute Liters of Oxygen 3 L 3 L    2 Minute Oxygen Saturation % 88 % 95 %    2 Minute Liters of Oxygen 3 L 3 L    3 Minute Oxygen Saturation % 87 % 93 %    3 Minute Liters of Oxygen 3 L 3 L    4 Minute Oxygen Saturation % 84 % 94 %    4 Minute Liters of Oxygen 3 L 3 L    5 Minute Oxygen Saturation % 84 % 93 %    5 Minute Liters of Oxygen 3 L 3 L    6 Minute Oxygen Saturation % 85 % 92 %    6 Minute Liters of Oxygen 3 L 3 L    2 Minute Post Oxygen Saturation % 98 % 98 %    2 Minute Post Liters of Oxygen 3 L 3 L              Nutrition & Weight - Outcomes:  Pre Biometrics - 06/13/21 1202       Pre Biometrics   Height 5' 5.75" (1.67 m)    Weight 192 lb 11.2 oz (87.4 kg)    BMI (Calculated) 31.34    Single Leg Stand 1.97 seconds  Goals reviewed with patient; copy given to patient.

## 2021-10-27 NOTE — Progress Notes (Signed)
Daily Session Note  Patient Details  Name: Darius Norris MRN: [id number] Date of Birth: Oct 20, 1951 Referring Provider:   Flowsheet Row Pulmonary Rehab from 06/13/2021 in [facility] Cardiac and Pulmonary Rehab  Referring Provider Jenne Pane, MD ([place])       Encounter Date: 10/27/2021  Check In:  Session Check In - 10/27/21 1058       Check-In   Supervising physician immediately available to respond to emergencies See telemetry face sheet for immediately available ER MD    Location [facility]-Cardiac & Pulmonary Rehab    Staff Present Birdie Sons, MPA, RN;Jessica Luan Pulling, MA, RCEP, CCRP, CCET;Meredith Sherryll Burger, RN BSN;Susanne Bice, RN, BSN, CCRP    Virtual Visit No    Medication changes reported     No    Fall or balance concerns reported    No    Tobacco Cessation No Change    Warm-up and Cool-down Performed on first and last piece of equipment    Resistance Training Performed Yes    VAD Patient? No    PAD/SET Patient? No      Pain Assessment   Currently in Pain? No/denies                Social History   Tobacco Use  Smoking Status Former   Packs/day: 1.00   Years: 30.00   Pack years: 30.00   Types: Cigarettes   Quit date: 12/18/2001   Years since quitting: 19.8  Smokeless Tobacco Never    Goals Met:  Independence with exercise equipment Exercise tolerated well No report of concerns or symptoms today Strength training completed today  Goals Unmet:  Not Applicable  Comments: Pt able to follow exercise prescription today without complaint.  Will continue to monitor for progression.    Dr. Emily Filbert is Medical Director for [place].  Dr. Ottie Glazier is Medical Director for [facility] Pulmonary Rehabilitation.

## 2021-11-03 ENCOUNTER — Other Ambulatory Visit: Payer: Self-pay

## 2021-11-03 DIAGNOSIS — J449 Chronic obstructive pulmonary disease, unspecified: Secondary | ICD-10-CM | POA: Diagnosis not present

## 2021-11-03 DIAGNOSIS — R0602 Shortness of breath: Secondary | ICD-10-CM

## 2021-11-03 NOTE — Progress Notes (Signed)
Daily Session Note  Patient Details  Name: Darius Norris MRN: [id number] Date of Birth: 11-24-1951 Referring Provider:   Flowsheet Row Pulmonary Rehab from 06/13/2021 in [facility] Cardiac and Pulmonary Rehab  Referring Provider Jenne Pane, MD ([place])       Encounter Date: 11/03/2021  Check In:  Session Check In - 11/03/21 1039       Check-In   Supervising physician immediately available to respond to emergencies See telemetry face sheet for immediately available ER MD    Location [facility]-Cardiac & Pulmonary Rehab    Staff Present Birdie Sons, MPA, Elveria Rising, BA, ACSM CEP, Exercise Physiologist;Meredith Sherryll Burger, RN Margurite Auerbach, MS, ASCM CEP, Exercise Physiologist    Virtual Visit No    Medication changes reported     No    Fall or balance concerns reported    No    Tobacco Cessation No Change    Warm-up and Cool-down Performed on first and last piece of equipment    Resistance Training Performed Yes    VAD Patient? No    PAD/SET Patient? No      Pain Assessment   Currently in Pain? No/denies                Social History   Tobacco Use  Smoking Status Former   Packs/day: 1.00   Years: 30.00   Pack years: 30.00   Types: Cigarettes   Quit date: 12/18/2001   Years since quitting: 19.8  Smokeless Tobacco Never    Goals Met:  Independence with exercise equipment Exercise tolerated well No report of concerns or symptoms today Strength training completed today  Goals Unmet:  Not Applicable  Comments: Pt able to follow exercise prescription today without complaint.  Will continue to monitor for progression.    Dr. Emily Filbert is Medical Director for [place].  Dr. Ottie Glazier is Medical Director for [facility] Pulmonary Rehabilitation.

## 2021-11-08 ENCOUNTER — Other Ambulatory Visit: Payer: Self-pay

## 2021-11-08 DIAGNOSIS — J449 Chronic obstructive pulmonary disease, unspecified: Secondary | ICD-10-CM | POA: Diagnosis not present

## 2021-11-08 DIAGNOSIS — R0602 Shortness of breath: Secondary | ICD-10-CM

## 2021-11-08 NOTE — Progress Notes (Signed)
Daily Session Note  Patient Details  Name: Darius Norris MRN: [id number] Date of Birth: 02-03-1951 Referring Provider:   Flowsheet Row Pulmonary Rehab from 06/13/2021 in [facility] Cardiac and Pulmonary Rehab  Referring Provider Jenne Pane, MD ([place])       Encounter Date: 11/08/2021  Check In:  Session Check In - 11/08/21 1107       Check-In   Supervising physician immediately available to respond to emergencies See telemetry face sheet for immediately available ER MD    Location [facility]-Cardiac & Pulmonary Rehab    Staff Present Birdie Sons, MPA, RN;Amanda Oletta Darter, BA, ACSM CEP, Exercise Physiologist;Melissa Caiola, RDN, LDN    Virtual Visit No    Medication changes reported     No    Fall or balance concerns reported    No    Tobacco Cessation No Change    Warm-up and Cool-down Performed on first and last piece of equipment    Resistance Training Performed Yes    VAD Patient? No    PAD/SET Patient? No      Pain Assessment   Currently in Pain? No/denies                Social History   Tobacco Use  Smoking Status Former   Packs/day: 1.00   Years: 30.00   Pack years: 30.00   Types: Cigarettes   Quit date: 12/18/2001   Years since quitting: 19.9  Smokeless Tobacco Never    Goals Met:  Independence with exercise equipment Exercise tolerated well No report of concerns or symptoms today Strength training completed today  Goals Unmet:  Not Applicable  Comments: Pt able to follow exercise prescription today without complaint.  Will continue to monitor for progression.    Dr. Emily Filbert is Medical Director for [place].  Dr. Ottie Glazier is Medical Director for [facility] Pulmonary Rehabilitation.

## 2021-11-15 ENCOUNTER — Other Ambulatory Visit: Payer: Self-pay

## 2021-11-15 DIAGNOSIS — J449 Chronic obstructive pulmonary disease, unspecified: Secondary | ICD-10-CM

## 2021-11-15 NOTE — Progress Notes (Signed)
Daily Session Note  Patient Details  Name: FABIANO GINLEY MRN: [id number] Date of Birth: [birth year] Referring Provider:   Flowsheet Row Pulmonary Rehab from 06/13/2021 in [facility] Cardiac and Pulmonary Rehab  Referring Provider Jenne Pane, MD ([place])       Encounter Date: 11/15/2021  Check In:  Session Check In - 11/15/21 1056       Check-In   Supervising physician immediately available to respond to emergencies See telemetry face sheet for immediately available ER MD    Location [facility]-Cardiac & Pulmonary Rehab    Staff Present Birdie Sons, MPA, RN;Jessica Sardis, MA, RCEP, CCRP, CCET;Amanda Sommer, BA, ACSM CEP, Exercise Physiologist    Virtual Visit No    Medication changes reported     No    Fall or balance concerns reported    No    Tobacco Cessation No Change    Warm-up and Cool-down Performed on first and last piece of equipment    Resistance Training Performed Yes    VAD Patient? No    PAD/SET Patient? No      Pain Assessment   Currently in Pain? No/denies                Social History   Tobacco Use  Smoking Status Former   Packs/day: 1.00   Years: 30.00   Pack years: 30.00   Types: Cigarettes   Quit date: 12/18/2001   Years since quitting: 19.9  Smokeless Tobacco Never    Goals Met:  Independence with exercise equipment Exercise tolerated well No report of concerns or symptoms today Strength training completed today  Goals Unmet:  Not Applicable  Comments: Pt able to follow exercise prescription today without complaint.  Will continue to monitor for progression.    Dr. Emily Filbert is Medical Director for [place].  Dr. Ottie Glazier is Medical Director for [facility] Pulmonary Rehabilitation.

## 2021-11-16 ENCOUNTER — Encounter: Payer: Self-pay | Admitting: *Deleted

## 2021-11-16 DIAGNOSIS — J449 Chronic obstructive pulmonary disease, unspecified: Secondary | ICD-10-CM

## 2021-11-16 NOTE — Progress Notes (Signed)
Pulmonary Individual Treatment Plan  Patient Details  Name: Darius Norris MRN: [id number] Date of Birth: 27-Feb-1951 Referring Provider:   Flowsheet Row Pulmonary Rehab from 06/13/2021 in [facility] Cardiac and Pulmonary Rehab  Referring Provider Jenne Pane, MD ([place])       Initial Encounter Date:  Flowsheet Row Pulmonary Rehab from 06/13/2021 in [facility] Cardiac and Pulmonary Rehab  Date 06/13/21       Visit Diagnosis: Chronic obstructive pulmonary disease, unspecified COPD type ([place])  Patient's Home Medications on Admission:  Current Outpatient Medications:    acetaminophen (TYLENOL) 325 MG tablet, Take 650 mg by mouth every 6 (six) hours as needed.  (Patient not taking: Reported on 06/01/2021), Disp: , Rfl:    acetaminophen (TYLENOL) 325 MG tablet, Take by mouth., Disp: , Rfl:    albuterol (VENTOLIN HFA) 108 (90 Base) MCG/ACT inhaler, Inhale into the lungs., Disp: , Rfl:    Albuterol Sulfate, sensor, (PROAIR DIGIHALER) 108 (90 Base) MCG/ACT AEPB, Inhale into the lungs., Disp: , Rfl:    amitriptyline (ELAVIL) 10 MG tablet, Take 15 mg by mouth at bedtime. , Disp: , Rfl:    amitriptyline (ELAVIL) 10 MG tablet, Take 2 tablets by mouth at bedtime., Disp: , Rfl:    aspirin EC 81 MG EC tablet, Take 1 tablet (81 mg total) by mouth daily., Disp: 180 tablet, Rfl: 0   atorvastatin (LIPITOR) 80 MG tablet, Take 1 tablet (80 mg total) by mouth at bedtime. (Patient not taking: Reported on 06/01/2021), Disp: 30 tablet, Rfl: 0   atorvastatin (LIPITOR) 80 MG tablet, Take by mouth., Disp: , Rfl:    budesonide-formoterol (SYMBICORT) 160-4.5 MCG/ACT inhaler, Inhale 2 puffs into the lungs 2 (two) times daily., Disp: , Rfl:    busPIRone (BUSPAR) 10 MG tablet, Take 10 mg by mouth 2 (two) times daily., Disp: , Rfl:    busPIRone (BUSPAR) 10 MG tablet, Take by mouth. (Patient not taking: Reported on 06/01/2021), Disp: , Rfl:    clobetasol (TEMOVATE) 0.05 % external solution, Apply topically., Disp: , Rfl:    clobetasol  cream (TEMOVATE) 1.44 %, Apply 1 application topically daily as needed. For itching on psoriasis areas, Disp: , Rfl:    clobetasol cream (TEMOVATE) 3.15 %, Apply 1 application topically daily as needed. For itching on psoriasis areas, Disp: , Rfl:    dabigatran (PRADAXA) 150 MG CAPS capsule, Take 150 mg by mouth 2 (two) times daily., Disp: , Rfl:    dabigatran (PRADAXA) 150 MG CAPS capsule, Take by mouth., Disp: , Rfl:    diltiazem (CARDIZEM CD) 180 MG 24 hr capsule, Take 1 capsule (180 mg total) by mouth daily. (Patient not taking: Reported on 06/01/2021), Disp: 90 capsule, Rfl: 0   diltiazem (TIAZAC) 180 MG 24 hr capsule, Take 1 capsule by mouth daily., Disp: , Rfl:    fluticasone (FLONASE) 50 MCG/ACT nasal spray, Place 2 sprays into both nostrils daily., Disp: , Rfl:    folic acid (FOLVITE) 1 MG tablet, Take by mouth., Disp: , Rfl:    gabapentin (NEURONTIN) 100 MG capsule, Take 100 mg by mouth at bedtime. (Patient not taking: Reported on 06/01/2021), Disp: , Rfl:    gabapentin (NEURONTIN) 300 MG capsule, Take by mouth., Disp: , Rfl:    guaifenesin (HUMIBID E) 400 MG TABS tablet, Take by mouth., Disp: , Rfl:    hydrocortisone 2.5 % cream, Apply topically., Disp: , Rfl:    hydrOXYzine (ATARAX/VISTARIL) 25 MG tablet, Take by mouth., Disp: , Rfl:    Ipratropium-Albuterol (COMBIVENT)  20-100 MCG/ACT AERS respimat, Inhale 1 puff into the lungs every 6 (six) hours as needed for wheezing or shortness of breath. (Patient not taking: Reported on 06/01/2021), Disp: , Rfl:    isosorbide mononitrate (IMDUR) 60 MG 24 hr tablet, Take 60 mg by mouth daily. (Patient not taking: Reported on 06/01/2021), Disp: , Rfl:    isosorbide mononitrate (IMDUR) 60 MG 24 hr tablet, Take by mouth., Disp: , Rfl:    lidocaine (XYLOCAINE) 5 % ointment, Apply topically., Disp: , Rfl:    lisinopril (PRINIVIL,ZESTRIL) 40 MG tablet, Take 20 mg by mouth daily.  (Patient not taking: Reported on 06/01/2021), Disp: , Rfl:    lisinopril  (ZESTRIL) 40 MG tablet, Take by mouth., Disp: , Rfl:    loratadine (CLARITIN) 10 MG tablet, Take 10 mg by mouth daily as needed for allergies. , Disp: , Rfl:    loratadine (CLARITIN) 10 MG tablet, Take 1 tablet by mouth daily. (Patient not taking: Reported on 06/01/2021), Disp: , Rfl:    Melatonin 3 MG CAPS, Take 6 mg by mouth., Disp: , Rfl:    metFORMIN (GLUCOPHAGE) 500 MG tablet, Take by mouth. (Patient not taking: Reported on 06/01/2021), Disp: , Rfl:    metFORMIN (GLUCOPHAGE-XR) 500 MG 24 hr tablet, Take by mouth., Disp: , Rfl:    methotrexate (RHEUMATREX) 2.5 MG tablet, Take by mouth., Disp: , Rfl:    metoprolol tartrate (LOPRESSOR) 100 MG tablet, Take 1 tablet (100 mg total) by mouth 2 (two) times daily. (Patient not taking: Reported on 06/01/2021), Disp: 60 tablet, Rfl: 0   metoprolol tartrate (LOPRESSOR) 50 MG tablet, Take by mouth., Disp: , Rfl:    mometasone (ASMANEX) 220 MCG/INH inhaler, Take by mouth., Disp: , Rfl:    omeprazole (PRILOSEC) 20 MG capsule, Take 20 mg by mouth daily. , Disp: , Rfl:    omeprazole (PRILOSEC) 20 MG capsule, Take by mouth., Disp: , Rfl:    potassium chloride (K-DUR,KLOR-CON) 10 MEQ tablet, Take 10 mEq by mouth daily. (Patient not taking: Reported on 06/01/2021), Disp: , Rfl:    senna (SENOKOT) 8.6 MG TABS tablet, Take 1 tablet by mouth daily as needed for mild constipation., Disp: , Rfl:    senna-docusate (SENOKOT-S) 8.6-50 MG tablet, Take 1 tablet by mouth daily. (Patient not taking: Reported on 06/01/2021), Disp: , Rfl:    simethicone (MYLICON) 80 MG chewable tablet, Chew by mouth., Disp: , Rfl:    traZODone (DESYREL) 50 MG tablet, Take 1 tablet (50 mg total) by mouth at bedtime as needed for sleep., Disp: 30 tablet, Rfl: 0   urea (CARMOL) 20 % cream, Apply topically., Disp: , Rfl:   Past Medical History: Past Medical History:  Diagnosis Date   A-fib ([place])    CAD (coronary artery disease)    Colitis    COPD (chronic obstructive pulmonary disease) (H[facility])     Emphysema lung (H[facility])    GERD (gastroesophageal reflux disease)    Hypertension    MI (myocardial infarction) ([place]) 1999    Tobacco Use: Social History   Tobacco Use  Smoking Status Former   Packs/day: 1.00   Years: 30.00   Pack years: 30.00   Types: Cigarettes   Quit date: 12/18/2001   Years since quitting: 19.9  Smokeless Tobacco Never    Labs: Recent Review Flowsheet Data     Labs for ITP Cardiac and Pulmonary Rehab Latest Ref Rng & Units 02/04/2013 09/04/2016 05/02/2018 01/05/2019   Cholestrol 0 - 200 mg/dL 185 116 - -  LDLCALC 0 - 99 mg/dL 114(H) 51 - -   HDL >40 mg/dL 38(L) 42 - -   Trlycerides <150 mg/dL 166 116 - -   Hemoglobin A1c 4.8 - 5.6 % - 6.4(H) - -   HCO3 20.0 - 28.0 mmol/L - - 36.3(H) 29.6(H)   O2SAT % - - - 81.7        Pulmonary Assessment Scores:  Pulmonary Assessment Scores     Row Name 06/13/21 1157         ADL UCSD   ADL Phase Entry     SOB Score total 41     Rest 0     Walk 1     Stairs 3     Bath 3     Dress 1     Shop 3       CAT Score   CAT Score 15       mMRC Score   mMRC Score 1              UCSD: Self-administered rating of dyspnea associated with activities of daily living (ADLs) 6-point scale (0 = "not at all" to 5 = "maximal or unable to do because of breathlessness")  Scoring Scores range from 0 to 120.  Minimally important difference is 5 units  CAT: CAT can identify the health impairment of COPD patients and is better correlated with disease progression.  CAT has a scoring range of zero to 40. The CAT score is classified into four groups of low (less than 10), medium (10 - 20), high (21-30) and very high (31-40) based on the impact level of disease on health status. A CAT score over 10 suggests significant symptoms.  A worsening CAT score could be explained by an exacerbation, poor medication adherence, poor inhaler technique, or progression of COPD or comorbid conditions.  CAT MCID is 2 points  mMRC: mMRC  (Modified Medical Research Council) Dyspnea Scale is used to assess the degree of baseline functional disability in patients of respiratory disease due to dyspnea. No minimal important difference is established. A decrease in score of 1 point or greater is considered a positive change.   Pulmonary Function Assessment:  Pulmonary Function Assessment - 06/01/21 1555       Breath   Shortness of Breath Yes;Fear of Shortness of Breath;Limiting activity             Exercise Target Goals: Exercise Program Goal: Individual exercise prescription set using results from initial 6 min walk test and THRR while considering  patient's activity barriers and safety.   Exercise Prescription Goal: Initial exercise prescription builds to 30-45 minutes a day of aerobic activity, 2-3 days per week.  Home exercise guidelines will be given to patient during program as part of exercise prescription that the participant will acknowledge.  Education: Aerobic Exercise: - Group verbal and visual presentation on the components of exercise prescription. Introduces F.I.T.T principle from ACSM for exercise prescriptions.  Reviews F.I.T.T. principles of aerobic exercise including progression. Written material given at graduation. Flowsheet Row Pulmonary Rehab from 11/03/2021 in [facility] Cardiac and Pulmonary Rehab  Date 10/13/21  Educator [facility]  Instruction Review Code 1- Verbalizes Understanding       Education: Resistance Exercise: - Group verbal and visual presentation on the components of exercise prescription. Introduces F.I.T.T principle from ACSM for exercise prescriptions  Reviews F.I.T.T. principles of resistance exercise including progression. Written material given at graduation.    Education: Exercise & Equipment Safety: - Individual verbal instruction  and demonstration of equipment use and safety with use of the equipment. Flowsheet Row Pulmonary Rehab from 11/03/2021 in [facility] Cardiac and Pulmonary Rehab   Date 06/01/21  Educator [facility]  Instruction Review Code 1- Verbalizes Understanding       Education: Exercise Physiology & General Exercise Guidelines: - Group verbal and written instruction with models to review the exercise physiology of the cardiovascular system and associated critical values. Provides general exercise guidelines with specific guidelines to those with heart or lung disease.  Flowsheet Row Pulmonary Rehab from 11/03/2021 in [facility] Cardiac and Pulmonary Rehab  Date 08/04/21  Educator [facility]  Instruction Review Code 1- Verbalizes Understanding       Education: Flexibility, Balance, Mind/Body Relaxation: - Group verbal and visual presentation with interactive activity on the components of exercise prescription. Introduces F.I.T.T principle from ACSM for exercise prescriptions. Reviews F.I.T.T. principles of flexibility and balance exercise training including progression. Also discusses the mind body connection.  Reviews various relaxation techniques to help reduce and manage stress (i.e. Deep breathing, progressive muscle relaxation, and visualization). Balance handout provided to take home. Written material given at graduation. Flowsheet Row Pulmonary Rehab from 11/03/2021 in [facility] Cardiac and Pulmonary Rehab  Date 10/27/21  Educator AS  Instruction Review Code 1- Verbalizes Understanding       Activity Barriers & Risk Stratification:  Activity Barriers & Cardiac Risk Stratification - 06/13/21 1203       Activity Barriers & Cardiac Risk Stratification   Activity Barriers Shortness of Breath;Deconditioning;Back Problems;Muscular Weakness;Balance Concerns;Assistive Device;Other (comment)    Comments neuropathy in feet             6 Minute Walk:  6 Minute Walk     Row Name 06/13/21 1206 10/27/21 1145       6 Minute Walk   Phase Initial Discharge    Distance 620 feet 762 feet    Distance % Change -- 22.9 %    Distance Feet Change -- 142 ft    Walk Time 4.5  minutes 5.75 minutes    # of Rest Breaks 3  1:57-2:15; 2:28-3:13; 4:09-4:44 1    MPH 1.17 1.5    METS 1.69 1.63    RPE 15 12    Perceived Dyspnea  3 2    VO2 Peak 5.93 5.74    Symptoms Yes (comment) Yes (comment)    Comments Lower back pain 6/10, SOB hip pain 3/0    Resting HR 70 bpm 66 bpm    Resting BP 142/80 124/62    Resting Oxygen Saturation  94 % 98 %    Exercise Oxygen Saturation  during 6 min walk 84 % 92 %    Max Ex. HR 90 bpm 91 bpm    Max Ex. BP 170/80 126/64    2 Minute Post BP 130/82 124/60      Interval HR   1 Minute HR 89 66    2 Minute HR 88 82    3 Minute HR 88 93    4 Minute HR 90 87    5 Minute HR 75 90    6 Minute HR 85 91    2 Minute Post HR 68 74    Interval Heart Rate? Yes Yes      Interval Oxygen   Interval Oxygen? Yes Yes    Baseline Oxygen Saturation % 94 % 98 %    1 Minute Oxygen Saturation % 90 % 98 %    1 Minute Liters of Oxygen 3  L 3 L    2 Minute Oxygen Saturation % 88 % 95 %    2 Minute Liters of Oxygen 3 L 3 L    3 Minute Oxygen Saturation % 87 % 93 %    3 Minute Liters of Oxygen 3 L 3 L    4 Minute Oxygen Saturation % 84 % 94 %    4 Minute Liters of Oxygen 3 L 3 L    5 Minute Oxygen Saturation % 84 % 93 %    5 Minute Liters of Oxygen 3 L 3 L    6 Minute Oxygen Saturation % 85 % 92 %    6 Minute Liters of Oxygen 3 L 3 L    2 Minute Post Oxygen Saturation % 98 % 98 %    2 Minute Post Liters of Oxygen 3 L 3 L            Oxygen Initial Assessment:  Oxygen Initial Assessment - 06/13/21 1156       Home Oxygen   Home Oxygen Device Home Concentrator;E-Tanks    Sleep Oxygen Prescription Continuous    Liters per minute 3    Home Exercise Oxygen Prescription Continuous    Liters per minute 3    Home Resting Oxygen Prescription Continuous    Liters per minute 3    Compliance with Home Oxygen Use Yes      Initial 6 min Walk   Oxygen Used Continuous    Liters per minute 3      Program Oxygen Prescription   Program Oxygen  Prescription Continuous    Liters per minute 3      Intervention   Short Term Goals To learn and exhibit compliance with exercise, home and travel O2 prescription;To learn and understand importance of monitoring SPO2 with pulse oximeter and demonstrate accurate use of the pulse oximeter.;To learn and understand importance of maintaining oxygen saturations>88%;To learn and demonstrate proper pursed lip breathing techniques or other breathing techniques. ;To learn and demonstrate proper use of respiratory medications    Long  Term Goals Exhibits compliance with exercise, home  and travel O2 prescription;Verbalizes importance of monitoring SPO2 with pulse oximeter and return demonstration;Maintenance of O2 saturations>88%;Exhibits proper breathing techniques, such as pursed lip breathing or other method taught during program session;Compliance with respiratory medication;Demonstrates proper use of MDI's             Oxygen Re-Evaluation:  Oxygen Re-Evaluation     Row Name 06/23/21 1133 07/07/21 1131 08/09/21 1122 09/08/21 1119 10/04/21 1128     Program Oxygen Prescription   Program Oxygen Prescription Continuous Continuous Continuous Continuous Continuous   Liters per minute 3 3 3 3 3      Home Oxygen   Home Oxygen Device Home Concentrator;E-Tanks Home Concentrator;E-Tanks Home Concentrator;E-Tanks Home Concentrator;E-Tanks Home Concentrator;E-Tanks   Sleep Oxygen Prescription Continuous Continuous Continuous Continuous Continuous   Liters per minute 3 3 3 3 3    Home Exercise Oxygen Prescription Continuous Continuous Continuous Continuous Continuous   Liters per minute 3 3 3 3 3    Home Resting Oxygen Prescription Continuous Continuous Continuous Continuous Continuous   Liters per minute 3 3 3 3 3    Compliance with Home Oxygen Use Yes Yes Yes Yes Yes     Goals/Expected Outcomes   Short Term Goals To learn and exhibit compliance with exercise, home and travel O2 prescription;To learn and  understand importance of monitoring SPO2 with pulse oximeter and demonstrate accurate use of the pulse oximeter.;To  learn and understand importance of maintaining oxygen saturations>88%;To learn and demonstrate proper pursed lip breathing techniques or other breathing techniques. ;To learn and demonstrate proper use of respiratory medications To learn and exhibit compliance with exercise, home and travel O2 prescription;To learn and understand importance of monitoring SPO2 with pulse oximeter and demonstrate accurate use of the pulse oximeter.;To learn and understand importance of maintaining oxygen saturations>88%;To learn and demonstrate proper pursed lip breathing techniques or other breathing techniques. ;To learn and demonstrate proper use of respiratory medications To learn and exhibit compliance with exercise, home and travel O2 prescription;To learn and understand importance of monitoring SPO2 with pulse oximeter and demonstrate accurate use of the pulse oximeter.;To learn and understand importance of maintaining oxygen saturations>88%;To learn and demonstrate proper pursed lip breathing techniques or other breathing techniques. ;To learn and demonstrate proper use of respiratory medications To learn and exhibit compliance with exercise, home and travel O2 prescription;To learn and understand importance of monitoring SPO2 with pulse oximeter and demonstrate accurate use of the pulse oximeter.;To learn and understand importance of maintaining oxygen saturations>88%;To learn and demonstrate proper pursed lip breathing techniques or other breathing techniques. ;To learn and demonstrate proper use of respiratory medications To learn and exhibit compliance with exercise, home and travel O2 prescription;To learn and understand importance of monitoring SPO2 with pulse oximeter and demonstrate accurate use of the pulse oximeter.;To learn and understand importance of maintaining oxygen saturations>88%;To learn and  demonstrate proper pursed lip breathing techniques or other breathing techniques. ;To learn and demonstrate proper use of respiratory medications   Long  Term Goals Exhibits compliance with exercise, home  and travel O2 prescription;Verbalizes importance of monitoring SPO2 with pulse oximeter and return demonstration;Maintenance of O2 saturations>88%;Exhibits proper breathing techniques, such as pursed lip breathing or other method taught during program session;Compliance with respiratory medication;Demonstrates proper use of MDI's Exhibits compliance with exercise, home  and travel O2 prescription;Verbalizes importance of monitoring SPO2 with pulse oximeter and return demonstration;Maintenance of O2 saturations>88%;Exhibits proper breathing techniques, such as pursed lip breathing or other method taught during program session;Compliance with respiratory medication;Demonstrates proper use of MDI's Exhibits compliance with exercise, home  and travel O2 prescription;Verbalizes importance of monitoring SPO2 with pulse oximeter and return demonstration;Maintenance of O2 saturations>88%;Exhibits proper breathing techniques, such as pursed lip breathing or other method taught during program session;Compliance with respiratory medication;Demonstrates proper use of MDI's Exhibits compliance with exercise, home  and travel O2 prescription;Verbalizes importance of monitoring SPO2 with pulse oximeter and return demonstration;Maintenance of O2 saturations>88%;Exhibits proper breathing techniques, such as pursed lip breathing or other method taught during program session;Compliance with respiratory medication;Demonstrates proper use of MDI's Exhibits compliance with exercise, home  and travel O2 prescription;Verbalizes importance of monitoring SPO2 with pulse oximeter and return demonstration;Maintenance of O2 saturations>88%;Exhibits proper breathing techniques, such as pursed lip breathing or other method taught during  program session;Compliance with respiratory medication;Demonstrates proper use of MDI's   Comments Reviewed PLB technique with pt.  Talked about how it works and it's importance in maintaining their exercise saturations. Darius Norris is doing well with his oxygen and staying compliant. He is doing well with his inhalers and using his PLB routinely.  He is good about checking his sats and will now start to check it during exercise at home. Darius Norris is doing well with his oxygen.  He does well with his inhalers and feels pretty good breathing wise.  He will only use his nebulizer if feeling bad.  He is pleased that he has not had  an exacerbation recently.  His sats are staying consistent as well. Darius Norris continues to have good complinace with his oxygen.  He is doing well wihth his breathing and feels good overall.  He continues to keep a close eye on his sats as well. Darius Norris continues to have good complinace with his oxygen.  He is doing well wihth his breathing and feels good overall, but he is having difficulty with sinus drainae at night with limits his O2 until he can cough it up. He has not told his doctor yet, but he plans to make an appointment becuase he is having problems with his hip as well.  He continues to keep a close eye on his sats as well with his pulse ox.   Goals/Expected Outcomes Short: Become more profiecient at using PLB.   Long: Become independent at using PLB. Short: Continue to montior oxygen as he adds in more exercise Long: Conitnued compliance Short: Continue to improve breathing Long; Continue to use oxygen as prescibed. Short: Continue to improve breathing Long; Continue to use oxygen as prescibed. Short: Continue to improve breathing, let MD know about sinus buildup Long; Continue to use oxygen as prescibed.            Oxygen Discharge (Final Oxygen Re-Evaluation):  Oxygen Re-Evaluation - 10/04/21 1128       Program Oxygen Prescription   Program Oxygen Prescription Continuous    Liters  per minute 3      Home Oxygen   Home Oxygen Device Home Concentrator;E-Tanks    Sleep Oxygen Prescription Continuous    Liters per minute 3    Home Exercise Oxygen Prescription Continuous    Liters per minute 3    Home Resting Oxygen Prescription Continuous    Liters per minute 3    Compliance with Home Oxygen Use Yes      Goals/Expected Outcomes   Short Term Goals To learn and exhibit compliance with exercise, home and travel O2 prescription;To learn and understand importance of monitoring SPO2 with pulse oximeter and demonstrate accurate use of the pulse oximeter.;To learn and understand importance of maintaining oxygen saturations>88%;To learn and demonstrate proper pursed lip breathing techniques or other breathing techniques. ;To learn and demonstrate proper use of respiratory medications    Long  Term Goals Exhibits compliance with exercise, home  and travel O2 prescription;Verbalizes importance of monitoring SPO2 with pulse oximeter and return demonstration;Maintenance of O2 saturations>88%;Exhibits proper breathing techniques, such as pursed lip breathing or other method taught during program session;Compliance with respiratory medication;Demonstrates proper use of MDI's    Comments Darius Norris continues to have good complinace with his oxygen.  He is doing well wihth his breathing and feels good overall, but he is having difficulty with sinus drainae at night with limits his O2 until he can cough it up. He has not told his doctor yet, but he plans to make an appointment becuase he is having problems with his hip as well.  He continues to keep a close eye on his sats as well with his pulse ox.    Goals/Expected Outcomes Short: Continue to improve breathing, let MD know about sinus buildup Long; Continue to use oxygen as prescibed.             Initial Exercise Prescription:  Initial Exercise Prescription - 06/13/21 1200       Date of Initial Exercise RX and Referring Provider   Date  06/13/21    Referring Provider Jenne Pane, MD ([place])  Oxygen   Oxygen Continuous    Liters 3      Treadmill   MPH 0.8    Grade 0    Minutes 15    METs 1.6      NuStep   Level 1    SPM 80    Minutes 15    METs 1.6      REL-XR   Level 1    Speed 50    Minutes 15    METs 1.6      Prescription Details   Frequency (times per week) 2    Duration Progress to 30 minutes of continuous aerobic without signs/symptoms of physical distress      Intensity   THRR 40-80% of Max Heartrate 102-134    Ratings of Perceived Exertion 11-13    Perceived Dyspnea 0-4      Progression   Progression Continue to progress workloads to maintain intensity without signs/symptoms of physical distress.      Resistance Training   Training Prescription Yes    Weight 3 lb    Reps 10-15             Perform Capillary Blood Glucose checks as needed.  Exercise Prescription Changes:   Exercise Prescription Changes     Row Name 06/13/21 1200 06/29/21 1300 07/07/21 1100 07/12/21 0900 07/27/21 1600     Response to Exercise   Blood Pressure (Admit) 142/80 132/64 -- 138/64 126/62   Blood Pressure (Exercise) 170/80 122/64 -- 164/80 --   Blood Pressure (Exit) 130/82 134/66 -- 126/56 112/56   Heart Rate (Admit) 70 bpm 64 bpm -- 75 bpm 67 bpm   Heart Rate (Exercise) 90 bpm 76 bpm -- 975 bpm 82 bpm   Heart Rate (Exit) 68 bpm 71 bpm -- 70 bpm 69 bpm   Oxygen Saturation (Admit) 94 % 92 % -- 96 % 95 %   Oxygen Saturation (Exercise) 84 % 89 % -- 97 % 96 %   Oxygen Saturation (Exit) 98 % 98 % -- 98 % 97 %   Rating of Perceived Exertion (Exercise) 15 12 -- 12 12   Perceived Dyspnea (Exercise) 3 1 -- 2 2   Symptoms Back pain 6/10, SOB none -- SOB SOB   Comments walk test results first full day of exercise -- -- --   Duration -- Progress to 30 minutes of  aerobic without signs/symptoms of physical distress -- Continue with 30 min of aerobic exercise without signs/symptoms of physical distress. Continue  with 30 min of aerobic exercise without signs/symptoms of physical distress.   Intensity -- THRR unchanged -- THRR unchanged THRR unchanged     Progression   Progression -- Continue to progress workloads to maintain intensity without signs/symptoms of physical distress. -- Continue to progress workloads to maintain intensity without signs/symptoms of physical distress. Continue to progress workloads to maintain intensity without signs/symptoms of physical distress.   Average METs -- 1.52 -- 2.32 2.6     Resistance Training   Training Prescription -- Yes -- Yes Yes   Weight -- 3 lb -- 3 lb 3 lb   Reps -- 10-15 -- 10-15 10-15     Interval Training   Interval Training -- -- -- No No     Oxygen   Oxygen -- Continuous -- Continuous Continuous   Liters -- 3 -- 3 3     Treadmill   MPH -- 1 -- 1 1.2   Grade -- 0 -- 0 0  Minutes -- 15 -- 15 15   METs -- 1.8 -- 1.77 1.92     NuStep   Level -- -- -- 3 --   Minutes -- -- -- 15 --   METs -- -- -- 2 --     REL-XR   Level -- 1 -- 2 2   Minutes -- 15 -- 15 15   METs -- 1.2 -- 3.2 3.3     Home Exercise Plan   Plans to continue exercise at -- -- Home (comment)  walking, treadmill, staff videos Home (comment)  walking, treadmill, staff videos Home (comment)  walking, treadmill, staff videos   Frequency -- -- Add 1 additional day to program exercise sessions. Add 1 additional day to program exercise sessions. Add 1 additional day to program exercise sessions.   Initial Home Exercises Provided -- -- 07/07/21 07/07/21 07/07/21    Row Name 08/08/21 1600 08/24/21 1600 09/05/21 1100 09/20/21 0900 10/03/21 1400     Response to Exercise   Blood Pressure (Admit) 122/76 122/66 126/62 -- 126/80   Blood Pressure (Exit) 128/70 106/64 120/64 138/68 140/68   Heart Rate (Admit) 69 bpm 66 bpm 71 bpm 68 bpm 80 bpm   Heart Rate (Exercise) 84 bpm 81 bpm 79 bpm 78 bpm 95 bpm   Heart Rate (Exit) 69 bpm 72 bpm 72 bpm 69 bpm 72 bpm   Oxygen Saturation (Admit)  98 % 97 % 98 % 98 % 88 %   Oxygen Saturation (Exercise) 97 % 98 % 95 % 98 % 90 %   Oxygen Saturation (Exit) 98 % 98 % 96 % 99 % 97 %   Rating of Perceived Exertion (Exercise) 12 12 13 13 12    Perceived Dyspnea (Exercise) 1 1 3 3 2    Symptoms SOB SOB SOB SOB SOB   Duration Continue with 30 min of aerobic exercise without signs/symptoms of physical distress. Continue with 30 min of aerobic exercise without signs/symptoms of physical distress. Continue with 30 min of aerobic exercise without signs/symptoms of physical distress. Continue with 30 min of aerobic exercise without signs/symptoms of physical distress. Continue with 30 min of aerobic exercise without signs/symptoms of physical distress.   Intensity THRR unchanged THRR unchanged THRR unchanged THRR unchanged THRR unchanged     Progression   Progression Continue to progress workloads to maintain intensity without signs/symptoms of physical distress. Continue to progress workloads to maintain intensity without signs/symptoms of physical distress. -- Continue to progress workloads to maintain intensity without signs/symptoms of physical distress. Continue to progress workloads to maintain intensity without signs/symptoms of physical distress.   Average METs 2.26 2.5 2.33 2.07 2.32     Resistance Training   Training Prescription Yes Yes Yes Yes Yes   Weight 3 lb 3 lb 3 lb 3 lb 4 lb   Reps 10-15 10-15 10-15 10-15 10-15     Interval Training   Interval Training No No No No No     Oxygen   Oxygen Continuous Continuous Continuous Continuous Continuous   Liters 3 3 2 3 3      Treadmill   MPH 1.4 1.4 1.4 1.4 1.4   Grade 0 0 0 0 0   Minutes 15 15 15 15 15    METs 2.07 2.07 -- -- 2.07     NuStep   Level -- 5 -- -- --   Minutes -- 15 -- -- --   METs -- 3 -- -- --     Recumbant Elliptical  Level 1 -- 1 -- 1   Minutes 15 -- 15 -- 15   METs 1.6 -- 1.6 -- 1.6     REL-XR   Level 2 -- 2 1 2    Minutes 15 -- 15 15 15    METs 3.1 -- 3.6 --  2.2     Home Exercise Plan   Plans to continue exercise at Home (comment)  walking, treadmill, staff videos Home (comment)  walking, treadmill, staff videos Home (comment)  walking, treadmill, staff videos Home (comment)  walking, treadmill, staff videos Home (comment)  walking, treadmill, staff videos   Frequency Add 1 additional day to program exercise sessions. Add 1 additional day to program exercise sessions. Add 1 additional day to program exercise sessions. Add 1 additional day to program exercise sessions. Add 1 additional day to program exercise sessions.   Initial Home Exercises Provided 07/07/21 07/07/21 07/07/21 07/07/21 07/07/21     Oxygen   Maintain Oxygen Saturation -- 88% or higher 88% or higher 88% or higher 88% or higher    Row Name 10/19/21 1300 10/31/21 1100 11/15/21 1300         Response to Exercise   Blood Pressure (Admit) 138/68 124/62 126/70     Blood Pressure (Exercise) -- 126/64 --     Blood Pressure (Exit) 122/60 122/56 138/62     Heart Rate (Admit) 72 bpm 66 bpm 70 bpm     Heart Rate (Exercise) 78 bpm 93 bpm 82 bpm     Heart Rate (Exit) 67 bpm 71 bpm 76 bpm     Oxygen Saturation (Admit) 97 % 98 % 97 %     Oxygen Saturation (Exercise) 95 % 92 % 95 %     Oxygen Saturation (Exit) 95 % 98 % 98 %     Rating of Perceived Exertion (Exercise) 13 12 12      Perceived Dyspnea (Exercise) 1 2 3      Symptoms -- SOB SOB     Duration Continue with 30 min of aerobic exercise without signs/symptoms of physical distress. Continue with 30 min of aerobic exercise without signs/symptoms of physical distress. Continue with 30 min of aerobic exercise without signs/symptoms of physical distress.     Intensity THRR unchanged THRR unchanged THRR unchanged       Progression   Progression Continue to progress workloads to maintain intensity without signs/symptoms of physical distress. Continue to progress workloads to maintain intensity without signs/symptoms of physical distress.  Continue to progress workloads to maintain intensity without signs/symptoms of physical distress.     Average METs 2.74 2.48 2.8       Resistance Training   Training Prescription Yes Yes Yes     Weight 4 lb 4 lb 4 lb     Reps 10-15 10-15 10-15       Interval Training   Interval Training No No No       Oxygen   Oxygen Continuous Continuous Continuous     Liters 3 3 3        Treadmill   MPH 1.4 1.4 1.5     Grade 0 0 0     Minutes 15 15 15      METs 2.07 2.07 2.15       NuStep   Level -- 4 --     Minutes -- 15 --       REL-XR   Level 2 3 3      Minutes 15 15 15      METs 3.4 3.3  3.5       Home Exercise Plan   Plans to continue exercise at Home (comment)  walking, treadmill, staff videos Home (comment)  walking, treadmill, staff videos Home (comment)  walking, treadmill, staff videos     Frequency Add 1 additional day to program exercise sessions. Add 1 additional day to program exercise sessions. Add 1 additional day to program exercise sessions.     Initial Home Exercises Provided 07/07/21 07/07/21 07/07/21       Oxygen   Maintain Oxygen Saturation 88% or higher 88% or higher 88% or higher              Exercise Comments:   Exercise Goals and Review:   Exercise Goals     Row Name 06/13/21 1233             Exercise Goals   Increase Physical Activity Yes       Intervention Provide advice, education, support and counseling about physical activity/exercise needs.;Develop an individualized exercise prescription for aerobic and resistive training based on initial evaluation findings, risk stratification, comorbidities and participant's personal goals.       Expected Outcomes Short Term: Attend rehab on a regular basis to increase amount of physical activity.;Long Term: Add in home exercise to make exercise part of routine and to increase amount of physical activity.;Long Term: Exercising regularly at least 3-5 days a week.       Increase Strength and Stamina Yes        Intervention Provide advice, education, support and counseling about physical activity/exercise needs.;Develop an individualized exercise prescription for aerobic and resistive training based on initial evaluation findings, risk stratification, comorbidities and participant's personal goals.       Expected Outcomes Short Term: Increase workloads from initial exercise prescription for resistance, speed, and METs.;Short Term: Perform resistance training exercises routinely during rehab and add in resistance training at home;Long Term: Improve cardiorespiratory fitness, muscular endurance and strength as measured by increased METs and functional capacity (6MWT)       Able to understand and use rate of perceived exertion (RPE) scale Yes       Intervention Provide education and explanation on how to use RPE scale       Expected Outcomes Short Term: Able to use RPE daily in rehab to express subjective intensity level;Long Term:  Able to use RPE to guide intensity level when exercising independently       Able to understand and use Dyspnea scale Yes       Intervention Provide education and explanation on how to use Dyspnea scale       Expected Outcomes Short Term: Able to use Dyspnea scale daily in rehab to express subjective sense of shortness of breath during exertion;Long Term: Able to use Dyspnea scale to guide intensity level when exercising independently       Knowledge and understanding of Target Heart Rate Range (THRR) Yes       Intervention Provide education and explanation of THRR including how the numbers were predicted and where they are located for reference       Expected Outcomes Short Term: Able to state/look up THRR;Short Term: Able to use daily as guideline for intensity in rehab;Long Term: Able to use THRR to govern intensity when exercising independently       Able to check pulse independently Yes       Intervention Provide education and demonstration on how to check pulse in carotid and  radial arteries.;Review the importance of  being able to check your own pulse for safety during independent exercise       Expected Outcomes Short Term: Able to explain why pulse checking is important during independent exercise;Long Term: Able to check pulse independently and accurately       Understanding of Exercise Prescription Yes       Intervention Provide education, explanation, and written materials on patient's individual exercise prescription       Expected Outcomes Short Term: Able to explain program exercise prescription;Long Term: Able to explain home exercise prescription to exercise independently                Exercise Goals Re-Evaluation :  Exercise Goals Re-Evaluation     Row Name 06/23/21 1132 06/29/21 1315 07/07/21 1118 07/12/21 0903 07/27/21 1605     Exercise Goal Re-Evaluation   Exercise Goals Review Increase Physical Activity;Able to understand and use rate of perceived exertion (RPE) scale;Knowledge and understanding of Target Heart Rate Range (THRR);Understanding of Exercise Prescription;Increase Strength and Stamina;Able to understand and use Dyspnea scale;Able to check pulse independently Increase Physical Activity;Increase Strength and Stamina Increase Physical Activity;Increase Strength and Stamina;Understanding of Exercise Prescription Increase Physical Activity;Increase Strength and Stamina;Understanding of Exercise Prescription Increase Physical Activity;Increase Strength and Stamina   Comments Reviewed RPE and dyspnea scales, THR and program prescription with pt today.  Pt voiced understanding and was given a copy of goals to take home. Darius Norris is doing well for his first couple of sessions here at rehab. He has already increased his speed on the treadmill to 1.0 speed. Will continue to monitor for progression. Darius Norris is doing well in rehab.  He has bought a new treadmill to use at home.  He has started some but would like to get into it more.  Reviewed home exercise  with pt today.  Pt plans to walking and treadmill for exercise.  We also talked about adding in some staff videos. Reviewed THR, pulse, RPE, sign and symptoms, pulse oximetery and when to call 911 or MD.  Also discussed weather considerations and indoor options.  Pt voiced understanding. Darius Norris is doing well in rehab.  He has completed his first four full days of exercise and is up to 3.2 METs on the XR.  We will continue to monitor his progress. Darius Norris is doing well.  Oxygen stays in the 90s durign sessions.  We will continue to monitor progress.   Expected Outcomes Short: Use RPE daily to regulate intensity. Long: Follow program prescription in THR. Short: Continue to increase loads on exercise and tolerate full 30 minutes Long: Continue to build overall strength and stamina Short: Start to add in more exercise at home Long: Continue to improve stamina. Short: Try a little incline on treadmill Long: Continue to improve stamina and follow program prescription. Short: attend consistently  Long:  build overll stamina    Row Name 08/08/21 1641 08/09/21 1113 08/24/21 1609 09/05/21 1133 09/08/21 1113     Exercise Goal Re-Evaluation   Exercise Goals Review Increase Physical Activity;Increase Strength and Stamina;Understanding of Exercise Prescription Increase Physical Activity;Increase Strength and Stamina;Understanding of Exercise Prescription Increase Physical Activity;Increase Strength and Stamina Increase Physical Activity;Increase Strength and Stamina;Understanding of Exercise Prescription Increase Physical Activity;Increase Strength and Stamina;Understanding of Exercise Prescription   Comments Darius Norris is doing well in rehab.  He up to 1.4 mph on the treadmill.  We will continue to monitor his progress. Darius Norris is doing well in rehab.  He fell last week and hurting all over from his  bruises. He tries to stay busy doing house work on his off days. He was feeling better prior to falling. Darius Norris attends consistently.  His  oxygen stays in the 90s.  Staff will encourage trying 4 lb for strength work. Mike's oxygen is well maintained above 88% during his exercise sessions. He should be encouraged to increase his speed on the treadmill as tolerated. RPEs are appropriate. Will continue to monitor. Darius Norris was out earlier this week with bursitis in his elbow.  He is hurting all over this week.  He is doing what he can.  He has not been doing his exercise at home, but is able to stay busier now. He is slowly building back.   Expected Outcomes Short: Try 4 lb weights Long: COntinue to improve stamina Short: Recover from fall and start to add in more at home Long; Conitnue to improve stamina. Short: maintain consistent attendance Long: build overall stamina Short: Inccrease load on the treadmill Long: Continue to increase overall MET level Short: Continue to move more again Long: continue to improve stamina.    [place] Name 09/20/21 9728 09/20/21 0909 10/03/21 1436 10/04/21 1133 10/19/21 1315     Exercise Goal Re-Evaluation   Exercise Goals Review Increase Physical Activity;Increase Strength and Stamina -- Increase Physical Activity;Increase Strength and Stamina;Understanding of Exercise Prescription Increase Physical Activity;Increase Strength and Stamina;Understanding of Exercise Prescription Increase Physical Activity;Increase Strength and Stamina   Comments Mike's oxygen stays on the 90s when on 3L/m.  Staff will encourage trying level 3 on XR and 4 lb for strength work.  He would benefit from stretching on days not at [facility]. He has had home exercise and staff will review stretching. Darius Norris has been doing well in rehab.  He is on level 2 of the XR with 3.3 METs.  We will continue to monitor his progress. Darius Norris reports not exercising outside of rehab. He plans on walking on his treadmill at home after rehab - he plans to do this in the morning after breakfast. He has 3lb weights at home he hasn't used. Darius Norris attended 6 times in October.  More  consistent exercise would allow him to progress more.  Staff will encourage trying to move levels up on machines.   Expected Outcomes -- Short: add stretching at home Long: improve overall stamina Short: Continue to move up workloads Long: Continue to improve stamina Short: create home exercise routine to continue after graduating rehab Long: Continue to improve stamina Short:exercise 1-2 times at home outside class Long:improve overall stamina    Row Name 10/31/21 1126             Exercise Goal Re-Evaluation   Exercise Goals Review Increase Physical Activity;Increase Strength and Stamina       Comments Darius Norris is nearing graduation for [employer]. He completed his post 6MWT and improved by 142 feet! He did increase his XR to level 3. We will continue to monitor his progression until graduation.       Expected Outcomes Short: Continue to increase treadmill loads as tolerated and graduate Long: Continue to increase overall MET level                Discharge Exercise Prescription (Final Exercise Prescription Changes):  Exercise Prescription Changes - 11/15/21 1300       Response to Exercise   Blood Pressure (Admit) 126/70    Blood Pressure (Exit) 138/62    Heart Rate (Admit) 70 bpm    Heart Rate (Exercise) 82 bpm  Heart Rate (Exit) 76 bpm    Oxygen Saturation (Admit) 97 %    Oxygen Saturation (Exercise) 95 %    Oxygen Saturation (Exit) 98 %    Rating of Perceived Exertion (Exercise) 12    Perceived Dyspnea (Exercise) 3    Symptoms SOB    Duration Continue with 30 min of aerobic exercise without signs/symptoms of physical distress.    Intensity THRR unchanged      Progression   Progression Continue to progress workloads to maintain intensity without signs/symptoms of physical distress.    Average METs 2.8      Resistance Training   Training Prescription Yes    Weight 4 lb    Reps 10-15      Interval Training   Interval Training No      Oxygen   Oxygen Continuous     Liters 3      Treadmill   MPH 1.5    Grade 0    Minutes 15    METs 2.15      REL-XR   Level 3    Minutes 15    METs 3.5      Home Exercise Plan   Plans to continue exercise at Home (comment)   walking, treadmill, staff videos   Frequency Add 1 additional day to program exercise sessions.    Initial Home Exercises Provided 07/07/21      Oxygen   Maintain Oxygen Saturation 88% or higher             Nutrition:  Target Goals: Understanding of nutrition guidelines, daily intake of sodium <1568m, cholesterol <2016m calories 30% from fat and 7% or less from saturated fats, daily to have 5 or more servings of fruits and vegetables.  Education: All About Nutrition: -Group instruction provided by verbal, written material, interactive activities, discussions, models, and posters to present general guidelines for heart healthy nutrition including fat, fiber, MyPlate, the role of sodium in heart healthy nutrition, utilization of the nutrition label, and utilization of this knowledge for meal planning. Follow up email sent as well. Written material given at graduation. [facility]  Date 11/03/21  [facility]- Verbalizes Understanding       Biometrics:  Pre Biometrics - 06/13/21 1202       Pre Biometrics   Height 5' 5.75" (1.67 m)    Weight 192 lb 11.2 oz (87.4 kg)    BMI (Calculated) 31.34    Single Leg Stand 1.97 seconds              Nutrition Therapy Plan and Nutrition Goals:  Nutrition Therapy & Goals - 10/04/21 1120       Nutrition Therapy   RD appointment deferred Yes   MiRonalee Beltsould still not like to talk to the RD regarding nutrition            Nutrition Assessments:  MEDIFICTS Score Key: ?70 Need to make dietary changes  40-70 Heart Healthy Diet ? 40 Therapeutic Level Cholesterol Diet  [facility]  Picture Your Plate Total Score on Admission 60      Picture Your Plate Scores: <4<41nhealthy dietary pattern with much room for improvement. 41-50 Dietary pattern unlikely to meet recommendations for good health and room for improvement. 51-60 More healthful dietary pattern, with some room for improvement.  >60 Healthy dietary pattern, although there  may be some specific behaviors that could be improved.   Nutrition Goals Re-Evaluation:  Nutrition Goals Re-Evaluation     [place] Name 08/09/21 1120 09/08/21 1116 10/04/21 1122 11/03/21 1117       Goals   Nutrition Goal Decrease soda intake, Lose weight Short: Continue to cut back on salt Long: COnitnue work on weight loss ST: limit fast food to <2x/week LT: limit Na to <1.5g --    Comment Darius Norris is doing well in rehab.  He and his wife do not cook much and eat a lot of sandwiches during the week.  He does try to cook more on the weekend.  He is usually eating biscuits for breakfast, but he is good about getting lots of fruit in his diet. He continue to try to make healthier choices.  He has cut back on his salt and knows he could do better.  He has now switched to diet [employer] and is sticking with them. Darius Norris is doing well.  He is doing pretty good with his diet.  He is still eating out a lot but otherwise trying to cut back on his salt.  He is mostly meat and potatoes and occasionally fruit and vegetables. Darius Norris reports doing well with his diet and is working on cutting back salt on his own and cutting back fast food, he would still not like to meet with RD for a consultation. Darius Norris still works on limiting fast food and sodium.  He says his wife doesnt cook and thats why he eats fast food    Expected Outcome Short: Continue to cut back on salt Long: COnitnue work on weight loss Short: Cut back on fast food.  Long: Continue to work on weight loss ST: limit fast food to <2x/week LT: limit Na to <1.5g ST/LT: conitnue to monitor sodium and limit  fast food             Nutrition Goals Discharge (Final Nutrition Goals Re-Evaluation):  Nutrition Goals Re-Evaluation - 11/03/21 1117       Goals   Comment Darius Norris still works on limiting fast food and sodium.  He says his wife doesnt cook and thats why he eats fast food    Expected Outcome ST/LT: conitnue to monitor sodium and limit fast food             Psychosocial: Target Goals: Acknowledge presence or absence of significant depression and/or stress, maximize coping skills, provide positive support system. Participant is able to verbalize types and ability to use techniques and skills needed for reducing stress and depression.   Education: Stress, Anxiety, and Depression - Group verbal and visual presentation to define topics covered.  Reviews how body is impacted by stress, anxiety, and depression.  Also discusses healthy ways to reduce stress and to treat/manage anxiety and depression.  Written material given at graduation. Flowsheet Row Pulmonary Rehab from 11/03/2021 in [facility] and Pulmonary Re[facility]  Date 07/28/21  Educator [facility]  Instruction Review Code 1- [employer] Understanding       Education: Sleep Hygiene -Provides group verbal and written instruction about how sleep can affect your health.  Define sleep hygiene, discuss sleep cycles and impact of sleep habits. Review good sleep hygiene tips.    Initial Review & Psychosocial Screening:  Initial Psych Review & Screening - 06/01/21 1557       Initial Review   Current issues with Current Sleep Concerns;Current Stress Concerns    Source of Stress Concerns Chronic Illness  Comments He has panic with shortness of breath at times.      Family Dynamics   Good Support System? Yes    Comments He can look to his wife and his two sisters for support.      Barriers   Psychosocial barriers to participate in program There are no identifiable barriers or psychosocial needs.;The patient should benefit from training  in stress management and relaxation.      Screening Interventions   Interventions Encouraged to exercise;To provide support and resources with identified psychosocial needs;Provide feedback about the scores to participant    Expected Outcomes Short Term goal: Utilizing psychosocial counselor, staff and physician to assist with identification of specific Stressors or current issues interfering with healing process. Setting desired goal for each stressor or current issue identified.;Long Term Goal: Stressors or current issues are controlled or eliminated.;Short Term goal: Identification and review with participant of any Quality of Life or Depression concerns found by scoring the questionnaire.;Long Term goal: The participant improves quality of Life and PHQ9 Scores as seen by post scores and/or verbalization of changes             Quality of Life Scores:  Scores of 19 and below usually indicate a poorer quality of life in these areas.  A difference of  2-3 points is a clinically meaningful difference.  A difference of 2-3 points in the total score of the Quality of Life Index has been associated with significant improvement in overall quality of life, self-image, physical symptoms, and general health in studies assessing change in quality of life.  PHQ-9: Recent Review Flowsheet Data     Depression screen [facility] 2/9 06/13/2021 02/26/2019 01/29/2019 12/23/2018 11/20/2016   Decreased Interest 0 1 0 2 1   Down, Depressed, Hopeless 0 0 0 0 1   PHQ - 2 Score 0 1 0 2 2   Altered sleeping 0 2 2 2 1    Tired, decreased energy 1 2 1 3 2    Change in appetite 0 1 2 1 1    Feeling bad or failure about yourself  0 0 0 0 0   Trouble concentrating 0 0 0 0 0   Moving slowly or fidgety/restless 0 0 0 1 0   Suicidal thoughts 0 0 0 0 0   PHQ-9 Score 1 6 5 9 6    Difficult doing work/chores - Somewhat difficult Somewhat difficult Very difficult Very difficult      Interpretation of Total Score  Total Score  Depression Severity:  1-4 = Minimal depression, 5-9 = Mild depression, 10-14 = Moderate depression, 15-19 = Moderately severe depression, 20-27 = Severe depression   Psychosocial Evaluation and Intervention:  Psychosocial Evaluation - 06/01/21 1557       Psychosocial Evaluation & Interventions   Interventions Encouraged to exercise with the program and follow exercise prescription;Stress management education;Relaxation education    Comments He can look to his wife and his two sisters for support.He has panic with shortness of breath at times.    Expected Outcomes Short: Exercise regularly to support mental health and notify staff of any changes. Long: maintain mental health and well being through teaching of rehab or prescribed medications independently.    Continue Psychosocial Services  Follow up required by staff             Psychosocial Re-Evaluation:  Psychosocial Re-Evaluation     [place] Name 07/07/21 1127 08/09/21 1118 09/08/21 1115 10/04/21 1135 11/03/21 1118     Psychosocial Re-Evaluation  Current issues with Current Stress Concerns;Current Sleep Concerns Current Stress Concerns;Current Sleep Concerns Current Stress Concerns;Current Sleep Concerns Current Stress Concerns;Current Sleep Concerns --   Comments Darius Norris is doing well in rehab.  He is feeling pretty good mentally.  His wife had another knee replacement but she is now doing better and is back to work.  He continues to struggle with his sleep.  He gets about 3-4 hrs at night and will take a nap in the afternoon.  He is usually up to 2-3am.  Overall, he is averaging about 6 hrs a day total. Darius Norris is doing well in rehab.  He is holding up fairly well mentally.  He wishes he could see his grandkids again as its been almost a year since he last saw then.  He had a fall out with their mother and has cut them off some.  He is still sleeping about the same and up and down all night.  He does aim for his afternoon nap. Darius Norris is holding  up well in rehab. He has been dealing with bursitis this week and hurting all over.  His sleep is about the same.  He still misses his grandkids. He reports no stress right. He has bought his wife a new car Friday. He is still dealing with the bursitis, but it is not as bad. He still has not seen his grandchildren stay in touch on FB and now he has a new great-grandchild. He continues to sleep the same as he has been - he gets 4 hours continuous sleep per night, but still manages to get a full 8 hours. Darius Norris says he sleeps fairly good.  He is still recovering from a fall.  He reports no other symptoms of depression or anxiety.   Expected Outcomes Short: Continue to work on sleep  LOng: COntinue to stay positive Short: Continue to try to work on things with seeing grandkids Long: Continue to focus on positive. Short: Continue to try to work on things with seeing grandkids Long: Continue to focus on positive. Short: Continue to stay positive and stay in contact with family Long: Continue to focus on positive. Short/Long:  continue to stay positive   Interventions Encouraged to attend Pulmonary Rehabilitation for the exercise Encouraged to attend Pulmonary Rehabilitation for the exercise Encouraged to attend Pulmonary Rehabilitation for the exercise Encouraged to attend Pulmonary Rehabilitation for the exercise --   Continue Psychosocial Services  -- Follow up required by staff -- Follow up required by staff --     Initial Review   Source of Stress Concerns -- -- -- Chronic Illness --            Psychosocial Discharge (Final Psychosocial Re-Evaluation):  Psychosocial Re-Evaluation - 11/03/21 1118       Psychosocial Re-Evaluation   Comments Darius Norris says he sleeps fairly good.  He is still recovering from a fall.  He reports no other symptoms of depression or anxiety.    Expected Outcomes Short/Long:  continue to stay positive             Education: Education Goals: Education classes will be  provided on a weekly basis, covering required topics. Participant will state understanding/return demonstration of topics presented.  Learning Barriers/Preferences:  Learning Barriers/Preferences - 06/01/21 1555       Learning Barriers/Preferences   Learning Barriers None    Learning Preferences Group Instruction;Individual Instruction;Pictoral;Skilled Demonstration;Verbal Instruction;Video;Written Material             General Pulmonary Education  Topics:  Infection Prevention: - Provides verbal and written material to individual with discussion of infection control including proper hand washing and proper equipment cleaning during exercise session. Flowsheet Row Pulmonary Rehab from 11/03/2021 in [facility] Cardiac and Pulmonary Rehab  Date 06/01/21  Educator [facility]  Instruction Review Code 1- Verbalizes Understanding       Falls Prevention: - Provides verbal and written material to individual with discussion of falls prevention and safety. Flowsheet Row Pulmonary Rehab from 11/03/2021 in [facility] Cardiac and Pulmonary Rehab  Date 06/01/21  Educator [facility]  Instruction Review Code 1- Verbalizes Understanding       Chronic Lung Disease Review: - Group verbal instruction with posters, models, PowerPoint presentations and videos,  to review new updates, new respiratory medications, new advancements in procedures and treatments. Providing information on websites and "800" numbers for continued self-education. Includes information about supplement oxygen, available portable oxygen systems, continuous and intermittent flow rates, oxygen safety, concentrators, and Medicare reimbursement for oxygen. Explanation of Pulmonary Drugs, including class, frequency, complications, importance of spacers, rinsing mouth after steroid MDI's, and proper cleaning methods for nebulizers. Review of basic lung anatomy and physiology related to function, structure, and complications of lung disease. Review of risk  factors. Discussion about methods for diagnosing sleep apnea and types of masks and machines for OSA. Includes a review of the use of types of environmental controls: home humidity, furnaces, filters, dust mite/pet prevention, HEPA vacuums. Discussion about weather changes, air quality and the benefits of nasal washing. Instruction on Warning signs, infection symptoms, calling MD promptly, preventive modes, and value of vaccinations. Review of effective airway clearance, coughing and/or vibration techniques. Emphasizing that all should Create an Action Plan. Written material given at graduation. Flowsheet Row Pulmonary Rehab from 11/03/2021 in [facility] Cardiac and Pulmonary Rehab  Date 09/22/21  Educator [facility]  Instruction Review Code 1- Verbalizes Understanding       AED/CPR: - Group verbal and written instruction with the use of models to demonstrate the basic use of the AED with the basic ABC's of resuscitation. Flowsheet Row Pulmonary Rehab from 02/26/2019 in [facility] Cardiac and Pulmonary Rehab  Date 01/01/19  Educator [facility]  Instruction Review Code 1- Verbalizes Understanding        Anatomy and Cardiac Procedures: - Group verbal and visual presentation and models provide information about basic cardiac anatomy and function. Reviews the testing methods done to diagnose heart disease and the outcomes of the test results. Describes the treatment choices: Medical Management, Angioplasty, or Coronary Bypass Surgery for treating various heart conditions including Myocardial Infarction, Angina, Valve Disease, and Cardiac Arrhythmias.  Written material given at graduation.   Medication Safety: - Group verbal and visual instruction to review commonly prescribed medications for heart and lung disease. Reviews the medication, class of the drug, and side effects. Includes the steps to properly store meds and maintain the prescription regimen.  Written material given at graduation. Flowsheet Row Pulmonary Rehab  from 11/03/2021 in [facility] Cardiac and Pulmonary Rehab  Date 09/01/21  Educator [facility]  Instruction Review Code 1- Verbalizes Understanding       Other: -Provides group and verbal instruction on various topics (see comments)   Knowledge Questionnaire Score:  Knowledge Questionnaire Score - 06/13/21 1159       Knowledge Questionnaire Score   Pre Score 18/18              Core Components/Risk Factors/Patient Goals at Admission:  Personal Goals and Risk Factors at Admission - 06/13/21 1235  Core Components/Risk Factors/Patient Goals on Admission    Weight Management Yes;Weight Loss    Intervention Weight Management: Develop a combined nutrition and exercise program designed to reach desired caloric intake, while maintaining appropriate intake of nutrient and fiber, sodium and fats, and appropriate energy expenditure required for the weight goal.;Weight Management: Provide education and appropriate resources to help participant work on and attain dietary goals.;Weight Management/Obesity: Establish reasonable short term and long term weight goals.    Admit Weight 192 lb (87.1 kg)    Goal Weight: Short Term 187 lb (84.8 kg)    Goal Weight: Long Term 182 lb (82.6 kg)    Expected Outcomes Short Term: Continue to assess and modify interventions until short term weight is achieved;Long Term: Adherence to nutrition and physical activity/exercise program aimed toward attainment of established weight goal;Weight Loss: Understanding of general recommendations for a balanced deficit meal plan, which promotes 1-2 lb weight loss per week and includes a negative energy balance of [id number] kcal/d;Understanding recommendations for meals to include 15-35% energy as protein, 25-35% energy from fat, 35-60% energy from carbohydrates, less than 261m of dietary cholesterol, 20-35 gm of total fiber daily;Understanding of distribution of calorie intake throughout the day with the consumption of 4-5 meals/snacks     Improve shortness of breath with ADL's Yes    Intervention Provide education, individualized exercise plan and daily activity instruction to help decrease symptoms of SOB with activities of daily living.    Expected Outcomes Short Term: Improve cardiorespiratory fitness to achieve a reduction of symptoms when performing ADLs;Long Term: Be able to perform more ADLs without symptoms or delay the onset of symptoms    Increase knowledge of respiratory medications and ability to use respiratory devices properly  Yes    Intervention Provide education and demonstration as needed of appropriate use of medications, inhalers, and oxygen therapy.    Expected Outcomes Short Term: Achieves understanding of medications use. Understands that oxygen is a medication prescribed by physician. Demonstrates appropriate use of inhaler and oxygen therapy.;Long Term: Maintain appropriate use of medications, inhalers, and oxygen therapy.    Diabetes Yes    Intervention Provide education about signs/symptoms and action to take for hypo/hyperglycemia.;Provide education about proper nutrition, including hydration, and aerobic/resistive exercise prescription along with prescribed medications to achieve blood glucose in normal ranges: Fasting glucose 65-99 mg/dL    Expected Outcomes Short Term: Participant verbalizes understanding of the signs/symptoms and immediate care of hyper/hypoglycemia, proper foot care and importance of medication, aerobic/resistive exercise and nutrition plan for blood glucose control.;Long Term: Attainment of HbA1C < 7%.    Hypertension Yes    Intervention Provide education on lifestyle modifcations including regular physical activity/exercise, weight management, moderate sodium restriction and increased consumption of fresh fruit, vegetables, and low fat dairy, alcohol moderation, and smoking cessation.;Monitor prescription use compliance.    Expected Outcomes Short Term: Continued assessment and  intervention until BP is < 140/976mHG in hypertensive participants. < 130/8030mG in hypertensive participants with diabetes, heart failure or chronic kidney disease.;Long Term: Maintenance of blood pressure at goal levels.    Lipids Yes    Intervention Provide education and support for participant on nutrition & aerobic/resistive exercise along with prescribed medications to achieve LDL <24m37mDL >40mg2m Expected Outcomes Short Term: Participant states understanding of desired cholesterol values and is compliant with medications prescribed. Participant is following exercise prescription and nutrition guidelines.;Long Term: Cholesterol controlled with medications as prescribed, with individualized exercise RX and with personalized nutrition plan.  Value goals: LDL < 33m, HDL > 40 mg.             Education:Diabetes - Individual verbal and written instruction to review signs/symptoms of diabetes, desired ranges of glucose level fasting, after meals and with exercise. Acknowledge that pre and post exercise glucose checks will be done for 3 sessions at entry of program. [facility]  Date 06/01/21  [facility] Instruction Review Code 1- Verbalizes Understanding       Know Your Numbers and Heart Failure: - Group verbal and visual instruction to discuss disease risk factors for cardiac and pulmonary disease and treatment options.  Reviews associated critical values for Overweight/Obesity, Hypertension, Cholesterol, and Diabetes.  Discusses basics of heart failure: signs/symptoms and treatments.  Introduces Heart Failure Zone chart for action plan for heart failure.  Written material given at graduation. Flow[facility]  Date 07/14/21  Educator [facility]  Instruction Review Code 1- Verbalizes Understanding       Core Components/Risk Factors/Patient Goals Review:    Goals and Risk Factor Review     Row Name 07/07/21 1129 08/09/21 1115 09/08/21 1117 10/04/21 1130 11/03/21 1113     Core Components/Risk Factors/Patient Goals Review   Personal Goals Review Weight Management/Obesity;Improve shortness of breath with ADL's;Hypertension;Diabetes;Increase knowledge of respiratory medications and ability to use respiratory devices properly. Weight Management/Obesity;Improve shortness of breath with ADL's;Hypertension;Diabetes;Increase knowledge of respiratory medications and ability to use respiratory devices properly. Weight Management/Obesity;Improve shortness of breath with ADL's;Hypertension;Diabetes;Increase knowledge of respiratory medications and ability to use respiratory devices properly. Weight Management/Obesity;Improve shortness of breath with ADL's;Hypertension;Diabetes;Increase knowledge of respiratory medications and ability to use respiratory devices properly. --   Review MRonalee Beltsis doing well in rehab.  He is still try to lose weight.  He admits to the salt being a probelm for him.  His sugars have been doing well overall.  He is trying to keep it under 130 and usually averages between 100-115 mg/dl when he checks it on Sunday mornings.  He is doing well with all his meds and using oxygen and inhalers like he is supposed to.  His pressures have been doing well.  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 MRonalee Beltstries to stay active at home on his off days, but his breathing continues to limit him in how much he is able to do. He is doing well with taking his meds regularly. His weight is staying under 200  lb.  Today, he is 193 lb and seems to hold steady.  His pressures have been doing well and sugars seem to be good.  He is still waiting to here back from his PCP as they ended up doing more blood work. MRonalee Beltsis doing well in rehab.  His weight is holding steady. He is  at 194 today.  His blood pressures and sugars are doing well. He never did hear back from blood work, so he assumes all is well.  He is doing well with his meds and continues to work on his breathing. He is doing well wihth his breathing and feels good overall, but he is having difficulty with sinus drainae at night with limits his O2 until he can cough it up. He has not told his doctor yet, but he plans to  make an appointment becuase he is having problems with his hip as well. He is going for another COVID booster and needs his flu shot. His weight continues to maintain at around 190lbs, today 192.8lbs. He checks his BP from time to time, but had a dizzy spell last weekend which scared him how suddenly it came on - he did not check his BP or BG. His BG last Sunday was 99 - he does not take it regularly. He continues taking his medications as directed with no issues. Darius Norris will complete LW in 3 more sessions.  He has enjoyed the exercise.  He plans to use TM at home when he completes LW.  He does feel his breathing has improved since he has been exercising.  He did speak with pharmacist about dizzy spells as it may be a medication.  He does check BP and BG at home.  He keeps a log of both   Expected Outcomes Short: Continue to check numbers routinely LOng; Continue to cut back on salt Short: Hear back from doctor Long: COntinue to monitor risk factors. Short: Continue to work on breathing and getting active again. Long: Continiue to montior risk factors. Short: take BP and BG regularly, make MD appointment for dizzy spells and sinus drainage. Long: Continiue to montior risk factors. Short: complete LW Long: maintain exercise on his own            Core Components/Risk Factors/Patient Goals at Discharge (Final Review):   Goals and Risk Factor Review - 11/03/21 1113       Core Components/Risk Factors/Patient Goals Review   Review Darius Norris will complete LW in 3 more sessions.  He has enjoyed the exercise.  He  plans to use TM at home when he completes LW.  He does feel his breathing has improved since he has been exercising.  He did speak with pharmacist about dizzy spells as it may be a medication.  He does check BP and BG at home.  He keeps a log of both    Expected Outcomes Short: complete LW Long: maintain exercise on his own             ITP Comments:  ITP Comments     Row Name 06/01/21 1552 06/13/21 1152 06/23/21 1131 06/29/21 1136 07/27/21 0809   ITP Comments Virtual Visit completed. Patient informed on EP and RD appointment and 6 Minute walk test. Patient also informed of patient health questionnaires on My Chart. Patient Verbalizes understanding. Visit diagnosis can be found in [facility] [employer]. Patient is [facility]. Completed 6MWT and gym orientation. Initial ITP created and sent for review to Dr.Fu[facility], Medical Director. First full day of exercise!  Patient was oriented to gym and equipment including functions, settings, policies, and procedures.  Patient's individual exercise prescription and treatment plan were reviewed.  All starting workloads were established based on the results of the 6 minute walk test done at initial orientation visit.  The plan for exercise progression was also introduced and progression will be customized based on patient's performance and goals. 30 Day review completed. Medical Director ITP review done, changes made as directed, and signed approval by Medical Director.    New to program 30 Day review completed. Medical Director ITP review done, changes made as directed, and signed approval by Medical Director.    [place] Name 08/24/21 0737 09/21/21 1353 10/19/21 0759 11/16/21 0739     ITP Comments 30 Day review completed. Medical Director ITP review done, changes made  as directed, and signed approval by Market researcher. 30 day review completed. ITP sent to Dr. Zetta Bills, Medical Director of  Pulmonary Rehab. Continue with ITP unless changes are made by physician. 30  Day review completed. Medical Director ITP review done, changes made as directed, and signed approval by Medical Director. 30 Day review completed. Medical Director ITP review done, changes made as directed, and signed approval by Medical Director.             Comments:

## 2021-11-17 ENCOUNTER — Other Ambulatory Visit: Payer: Self-pay

## 2021-11-17 ENCOUNTER — Encounter: Payer: Medicare HMO | Attending: Anesthesiology

## 2021-11-17 DIAGNOSIS — R0602 Shortness of breath: Secondary | ICD-10-CM | POA: Diagnosis present

## 2021-11-17 DIAGNOSIS — J449 Chronic obstructive pulmonary disease, unspecified: Secondary | ICD-10-CM | POA: Insufficient documentation

## 2021-11-17 NOTE — Progress Notes (Signed)
Daily Session Note  Patient Details  Name: Darius Norris MRN: [id number] Date of Birth: [birth year] Referring Provider:   Flowsheet Row Pulmonary Rehab from 06/13/2021 in [facility]  Referring Provider Jenne Pane, MD ([place])       Encounter Date: 11/17/2021  Check In:  Session Check In - 11/17/21 1100       Check-In   Supervising physician immediately available to respond to emergencies See telemetry face sheet for immediately available ER MD    Location [facility]-Cardiac & Pulmonary Rehab    Staff Present Birdie Sons, MPA, RN;Melissa Penngrove, RDN, Rowe Pavy, BA, ACSM CEP, Exercise Physiologist;Meredith Sherryll Burger, RN BSN    Virtual Visit No    Medication changes reported     No    Fall or balance concerns reported    No    Tobacco Cessation No Change    Warm-up and Cool-down Performed on first and last piece of equipment    Resistance Training Performed Yes    VAD Patient? No    PAD/SET Patient? No      Pain Assessment   Currently in Pain? No/denies                Social History   Tobacco Use  Smoking Status Former   Packs/day: 1.00   Years: 30.00   Pack years: 30.00   Types: Cigarettes   Quit date: 12/18/2001   Years since quitting: 19.9  Smokeless Tobacco Never    Goals Met:  Independence with exercise equipment Exercise tolerated well No report of concerns or symptoms today Strength training completed today  Goals Unmet:  Not Applicable  Comments:  Darius Norris graduated today from  rehab with 36 sessions completed.  Details of the patient's exercise prescription and what He needs to do in order to continue the prescription and progress were discussed with patient.  Patient was given a copy of prescription and goals.  Patient verbalized understanding.  Darius Norris plans to continue to exercise by walking, using the treadmill, and using staff videos.    Dr. Emily Filbert is Medical Director for [place].  Dr. Ottie Glazier is Medical Director for [facility] Pulmonary Rehabilitation.

## 2021-11-17 NOTE — Progress Notes (Signed)
Discharge Progress Report  Patient Details  Name: Darius Norris MRN: [id number] Date of Birth: 1951/02/17 Referring Provider:   Flowsheet Row Pulmonary Rehab from 06/13/2021 in [facility] Cardiac and Pulmonary Rehab  Referring Provider Jenne Pane, MD ([place])        Number of Visits: 36  Reason for Discharge:  Patient reached a stable level of exercise. Patient independent in their exercise. Patient has met program and personal goals.  Smoking History:  Social History   Tobacco Use  Smoking Status Former   Packs/day: 1.00   Years: 30.00   Pack years: 30.00   Types: Cigarettes   Quit date: 12/18/2001   Years since quitting: 19.9  Smokeless Tobacco Never    Diagnosis:  Chronic obstructive pulmonary disease, unspecified COPD type ([place])  SOB (shortness of breath)  ADL UCSD:  Pulmonary Assessment Scores     Row Name 06/13/21 1157 11/17/21 1139       ADL UCSD   ADL Phase Entry Exit    SOB Score total 41 50    Rest 0 0    Walk 1 2    Stairs 3 4    Bath 3 3    Dress 1 1    Shop 3 3      CAT Score   CAT Score 15 20      mMRC Score   mMRC Score 1 2             Initial Exercise Prescription:  Initial Exercise Prescription - 06/13/21 1200       Date of Initial Exercise RX and Referring Provider   Date 06/13/21    Referring Provider Jenne Pane, MD ([place])      Oxygen   Oxygen Continuous    Liters 3      Treadmill   MPH 0.8    Grade 0    Minutes 15    METs 1.6      NuStep   Level 1    SPM 80    Minutes 15    METs 1.6      REL-XR   Level 1    Speed 50    Minutes 15    METs 1.6      Prescription Details   Frequency (times per week) 2    Duration Progress to 30 minutes of continuous aerobic without signs/symptoms of physical distress      Intensity   THRR 40-80% of Max Heartrate 102-134    Ratings of Perceived Exertion 11-13    Perceived Dyspnea 0-4      Progression   Progression Continue to progress workloads to maintain intensity without  signs/symptoms of physical distress.      Resistance Training   Training Prescription Yes    Weight 3 lb    Reps 10-15             Discharge Exercise Prescription (Final Exercise Prescription Changes):  Exercise Prescription Changes - 11/15/21 1300       Response to Exercise   Blood Pressure (Admit) 126/70    Blood Pressure (Exit) 138/62    Heart Rate (Admit) 70 bpm    Heart Rate (Exercise) 82 bpm    Heart Rate (Exit) 76 bpm    Oxygen Saturation (Admit) 97 %    Oxygen Saturation (Exercise) 95 %    Oxygen Saturation (Exit) 98 %    Rating of Perceived Exertion (Exercise) 12    Perceived Dyspnea (Exercise) 3    Symptoms SOB  Duration Continue with 30 min of aerobic exercise without signs/symptoms of physical distress.    Intensity THRR unchanged      Progression   Progression Continue to progress workloads to maintain intensity without signs/symptoms of physical distress.    Average METs 2.8      Resistance Training   Training Prescription Yes    Weight 4 lb    Reps 10-15      Interval Training   Interval Training No      Oxygen   Oxygen Continuous    Liters 3      Treadmill   MPH 1.5    Grade 0    Minutes 15    METs 2.15      REL-XR   Level 3    Minutes 15    METs 3.5      Home Exercise Plan   Plans to continue exercise at Home (comment)   walking, treadmill, staff videos   Frequency Add 1 additional day to program exercise sessions.    Initial Home Exercises Provided 07/07/21      Oxygen   Maintain Oxygen Saturation 88% or higher             Functional Capacity:  6 Minute Walk     Row Name 06/13/21 1206 10/27/21 1145       6 Minute Walk   Phase Initial Discharge    Distance 620 feet 762 feet    Distance % Change -- 22.9 %    Distance Feet Change -- 142 ft    Walk Time 4.5 minutes 5.75 minutes    # of Rest Breaks 3  1:57-2:15; 2:28-3:13; 4:09-4:44 1    MPH 1.17 1.5    METS 1.69 1.63    RPE 15 12    Perceived Dyspnea  3 2    VO2  Peak 5.93 5.74    Symptoms Yes (comment) Yes (comment)    Comments Lower back pain 6/10, SOB hip pain 3/0    Resting HR 70 bpm 66 bpm    Resting BP 142/80 124/62    Resting Oxygen Saturation  94 % 98 %    Exercise Oxygen Saturation  during 6 min walk 84 % 92 %    Max Ex. HR 90 bpm 91 bpm    Max Ex. BP 170/80 126/64    2 Minute Post BP 130/82 124/60      Interval HR   1 Minute HR 89 66    2 Minute HR 88 82    3 Minute HR 88 93    4 Minute HR 90 87    5 Minute HR 75 90    6 Minute HR 85 91    2 Minute Post HR 68 74    Interval Heart Rate? Yes Yes      Interval Oxygen   Interval Oxygen? Yes Yes    Baseline Oxygen Saturation % 94 % 98 %    1 Minute Oxygen Saturation % 90 % 98 %    1 Minute Liters of Oxygen 3 L 3 L    2 Minute Oxygen Saturation % 88 % 95 %    2 Minute Liters of Oxygen 3 L 3 L    3 Minute Oxygen Saturation % 87 % 93 %    3 Minute Liters of Oxygen 3 L 3 L    4 Minute Oxygen Saturation % 84 % 94 %    4 Minute Liters of Oxygen 3 L 3  L    5 Minute Oxygen Saturation % 84 % 93 %    5 Minute Liters of Oxygen 3 L 3 L    6 Minute Oxygen Saturation % 85 % 92 %    6 Minute Liters of Oxygen 3 L 3 L    2 Minute Post Oxygen Saturation % 98 % 98 %    2 Minute Post Liters of Oxygen 3 L 3 L             Psychological, QOL, Others - Outcomes: PHQ 2/9: Depression screen [facility] 2/9 11/17/2021 06/13/2021 02/26/2019 01/29/2019 12/23/2018  Decreased Interest 1 0 1 0 2  Down, Depressed, Hopeless 0 0 0 0 0  PHQ - 2 Score 1 0 1 0 2  Altered sleeping 2 0 _0 Tired, decreased energy _1 Change in appetite 1 0 _2 Feeling bad or failure about yourself  0 0 0 0 0  Trouble concentrating 0 0 0 0 0  Moving slowly or fidgety/restless 0 0 0 0 1  Suicidal thoughts 0 0 0 0 0  PHQ-9 Score _3 Difficult doing work/chores Somewhat difficult - Somewhat difficult Somewhat difficult Very difficult    Quality of Life:    Nutrition & Weight - Outcomes:  Pre Biometrics -  06/13/21 1202       Pre Biometrics   Height 5' 5.75" (1.67 m)    Weight 192 lb 11.2 oz (87.4 kg)    BMI (Calculated) 31.34    Single Leg Stand 1.97 seconds              Nutrition:  Nutrition Therapy & Goals - 10/04/21 1120       Nutrition Therapy   RD appointment deferred Yes   [name] would still not like to talk to the RD regarding nutrition            Nutrition Discharge:   Education Questionnaire Score:  Knowledge Questionnaire Score - 11/17/21 1140       Knowledge Questionnaire Score   Pre Score 18/18    Post Score 18/18             Goals reviewed with patient; copy given to patient.

## 2021-11-17 NOTE — Progress Notes (Signed)
Pulmonary Individual Treatment Plan  Patient Details  Name: Darius Norris MRN: [id number] Date of Birth: 1951/03/15 Referring Provider:   Flowsheet Row Pulmonary Rehab from 06/13/2021 in [facility] Cardiac and Pulmonary Rehab  Referring Provider Jenne Pane, MD ([place])       Initial Encounter Date:  Flowsheet Row Pulmonary Rehab from 06/13/2021 in [facility] Cardiac and Pulmonary Rehab  Date 06/13/21       Visit Diagnosis: Chronic obstructive pulmonary disease, unspecified COPD type ([place])  SOB (shortness of breath)  Patient's Home Medications on Admission:  Current Outpatient Medications:    acetaminophen (TYLENOL) 325 MG tablet, Take 650 mg by mouth every 6 (six) hours as needed.  (Patient not taking: Reported on 06/01/2021), Disp: , Rfl:    acetaminophen (TYLENOL) 325 MG tablet, Take by mouth., Disp: , Rfl:    albuterol (VENTOLIN HFA) 108 (90 Base) MCG/ACT inhaler, Inhale into the lungs., Disp: , Rfl:    Albuterol Sulfate, sensor, (PROAIR DIGIHALER) 108 (90 Base) MCG/ACT AEPB, Inhale into the lungs., Disp: , Rfl:    amitriptyline (ELAVIL) 10 MG tablet, Take 15 mg by mouth at bedtime. , Disp: , Rfl:    amitriptyline (ELAVIL) 10 MG tablet, Take 2 tablets by mouth at bedtime., Disp: , Rfl:    aspirin EC 81 MG EC tablet, Take 1 tablet (81 mg total) by mouth daily., Disp: 180 tablet, Rfl: 0   atorvastatin (LIPITOR) 80 MG tablet, Take 1 tablet (80 mg total) by mouth at bedtime. (Patient not taking: Reported on 06/01/2021), Disp: 30 tablet, Rfl: 0   atorvastatin (LIPITOR) 80 MG tablet, Take by mouth., Disp: , Rfl:    budesonide-formoterol (SYMBICORT) 160-4.5 MCG/ACT inhaler, Inhale 2 puffs into the lungs 2 (two) times daily., Disp: , Rfl:    busPIRone (BUSPAR) 10 MG tablet, Take 10 mg by mouth 2 (two) times daily., Disp: , Rfl:    busPIRone (BUSPAR) 10 MG tablet, Take by mouth. (Patient not taking: Reported on 06/01/2021), Disp: , Rfl:    clobetasol (TEMOVATE) 0.05 % external solution, Apply  topically., Disp: , Rfl:    clobetasol cream (TEMOVATE) 7.32 %, Apply 1 application topically daily as needed. For itching on psoriasis areas, Disp: , Rfl:    clobetasol cream (TEMOVATE) 2.02 %, Apply 1 application topically daily as needed. For itching on psoriasis areas, Disp: , Rfl:    dabigatran (PRADAXA) 150 MG CAPS capsule, Take 150 mg by mouth 2 (two) times daily., Disp: , Rfl:    dabigatran (PRADAXA) 150 MG CAPS capsule, Take by mouth., Disp: , Rfl:    diltiazem (CARDIZEM CD) 180 MG 24 hr capsule, Take 1 capsule (180 mg total) by mouth daily. (Patient not taking: Reported on 06/01/2021), Disp: 90 capsule, Rfl: 0   diltiazem (TIAZAC) 180 MG 24 hr capsule, Take 1 capsule by mouth daily., Disp: , Rfl:    fluticasone (FLONASE) 50 MCG/ACT nasal spray, Place 2 sprays into both nostrils daily., Disp: , Rfl:    folic acid (FOLVITE) 1 MG tablet, Take by mouth., Disp: , Rfl:    gabapentin (NEURONTIN) 100 MG capsule, Take 100 mg by mouth at bedtime. (Patient not taking: Reported on 06/01/2021), Disp: , Rfl:    gabapentin (NEURONTIN) 300 MG capsule, Take by mouth., Disp: , Rfl:    guaifenesin (HUMIBID E) 400 MG TABS tablet, Take by mouth., Disp: , Rfl:    hydrocortisone 2.5 % cream, Apply topically., Disp: , Rfl:    hydrOXYzine (ATARAX/VISTARIL) 25 MG tablet, Take by mouth., Disp: , Rfl:  Ipratropium-Albuterol (COMBIVENT) 20-100 MCG/ACT AERS respimat, Inhale 1 puff into the lungs every 6 (six) hours as needed for wheezing or shortness of breath. (Patient not taking: Reported on 06/01/2021), Disp: , Rfl:    isosorbide mononitrate (IMDUR) 60 MG 24 hr tablet, Take 60 mg by mouth daily. (Patient not taking: Reported on 06/01/2021), Disp: , Rfl:    isosorbide mononitrate (IMDUR) 60 MG 24 hr tablet, Take by mouth., Disp: , Rfl:    lidocaine (XYLOCAINE) 5 % ointment, Apply topically., Disp: , Rfl:    lisinopril (PRINIVIL,ZESTRIL) 40 MG tablet, Take 20 mg by mouth daily.  (Patient not taking: Reported on  06/01/2021), Disp: , Rfl:    lisinopril (ZESTRIL) 40 MG tablet, Take by mouth., Disp: , Rfl:    loratadine (CLARITIN) 10 MG tablet, Take 10 mg by mouth daily as needed for allergies. , Disp: , Rfl:    loratadine (CLARITIN) 10 MG tablet, Take 1 tablet by mouth daily. (Patient not taking: Reported on 06/01/2021), Disp: , Rfl:    Melatonin 3 MG CAPS, Take 6 mg by mouth., Disp: , Rfl:    metFORMIN (GLUCOPHAGE) 500 MG tablet, Take by mouth. (Patient not taking: Reported on 06/01/2021), Disp: , Rfl:    metFORMIN (GLUCOPHAGE-XR) 500 MG 24 hr tablet, Take by mouth., Disp: , Rfl:    methotrexate (RHEUMATREX) 2.5 MG tablet, Take by mouth., Disp: , Rfl:    metoprolol tartrate (LOPRESSOR) 100 MG tablet, Take 1 tablet (100 mg total) by mouth 2 (two) times daily. (Patient not taking: Reported on 06/01/2021), Disp: 60 tablet, Rfl: 0   metoprolol tartrate (LOPRESSOR) 50 MG tablet, Take by mouth., Disp: , Rfl:    mometasone (ASMANEX) 220 MCG/INH inhaler, Take by mouth., Disp: , Rfl:    omeprazole (PRILOSEC) 20 MG capsule, Take 20 mg by mouth daily. , Disp: , Rfl:    omeprazole (PRILOSEC) 20 MG capsule, Take by mouth., Disp: , Rfl:    potassium chloride (K-DUR,KLOR-CON) 10 MEQ tablet, Take 10 mEq by mouth daily. (Patient not taking: Reported on 06/01/2021), Disp: , Rfl:    senna (SENOKOT) 8.6 MG TABS tablet, Take 1 tablet by mouth daily as needed for mild constipation., Disp: , Rfl:    senna-docusate (SENOKOT-S) 8.6-50 MG tablet, Take 1 tablet by mouth daily. (Patient not taking: Reported on 06/01/2021), Disp: , Rfl:    simethicone (MYLICON) 80 MG chewable tablet, Chew by mouth., Disp: , Rfl:    traZODone (DESYREL) 50 MG tablet, Take 1 tablet (50 mg total) by mouth at bedtime as needed for sleep., Disp: 30 tablet, Rfl: 0   urea (CARMOL) 20 % cream, Apply topically., Disp: , Rfl:   Past Medical History: Past Medical History:  Diagnosis Date   A-fib ([place])    CAD (coronary artery disease)    Colitis    COPD (chronic  obstructive pulmonary disease) (HCC)    Emphysema lung (HCC)    GERD (gastroesophageal reflux disease)    Hypertension    MI (myocardial infarction) ([place]) 1999    Tobacco Use: Social History   Tobacco Use  Smoking Status Former   Packs/day: 1.00   Years: 30.00   Pack years: 30.00   Types: Cigarettes   Quit date: 12/18/2001   Years since quitting: 19.9  Smokeless Tobacco Never    Labs: Recent Review Flowsheet Data     Labs for ITP Cardiac and Pulmonary Rehab Latest Ref Rng & Units 02/04/2013 09/04/2016 05/02/2018 01/05/2019   Cholestrol 0 - 200 mg/dL 185 116 - -  LDLCALC 0 - 99 mg/dL 114(H) 51 - -   HDL >40 mg/dL 38(L) 42 - -   Trlycerides <150 mg/dL 166 116 - -   Hemoglobin A1c 4.8 - 5.6 % - 6.4(H) - -   HCO3 20.0 - 28.0 mmol/L - - 36.3(H) 29.6(H)   O2SAT % - - - 81.7        Pulmonary Assessment Scores:  Pulmonary Assessment Scores     Row Name 06/13/21 1157 11/17/21 1139       ADL UCSD   ADL Phase Entry Exit    SOB Score total 41 50    Rest 0 0    Walk 1 2    Stairs 3 4    Bath 3 3    Dress 1 1    Shop 3 3      CAT Score   CAT Score 15 20      mMRC Score   mMRC Score 1 2             UCSD: Self-administered rating of dyspnea associated with activities of daily living (ADLs) 6-point scale (0 = "not at all" to 5 = "maximal or unable to do because of breathlessness")  Scoring Scores range from 0 to 120.  Minimally important difference is 5 units  CAT: CAT can identify the health impairment of COPD patients and is better correlated with disease progression.  CAT has a scoring range of zero to 40. The CAT score is classified into four groups of low (less than 10), medium (10 - 20), high (21-30) and very high (31-40) based on the impact level of disease on health status. A CAT score over 10 suggests significant symptoms.  A worsening CAT score could be explained by an exacerbation, poor medication adherence, poor inhaler technique, or progression of COPD or  comorbid conditions.  CAT MCID is 2 points  mMRC: mMRC (Modified Medical Research Council) Dyspnea Scale is used to assess the degree of baseline functional disability in patients of respiratory disease due to dyspnea. No minimal important difference is established. A decrease in score of 1 point or greater is considered a positive change.   Pulmonary Function Assessment:  Pulmonary Function Assessment - 06/01/21 1555       Breath   Shortness of Breath Yes;Fear of Shortness of Breath;Limiting activity             Exercise Target Goals: Exercise Program Goal: Individual exercise prescription set using results from initial 6 min walk test and THRR while considering  patient's activity barriers and safety.   Exercise Prescription Goal: Initial exercise prescription builds to 30-45 minutes a day of aerobic activity, 2-3 days per week.  Home exercise guidelines will be given to patient during program as part of exercise prescription that the participant will acknowledge.  Education: Aerobic Exercise: - Group verbal and visual presentation on the components of exercise prescription. Introduces F.I.T.T principle from ACSM for exercise prescriptions.  Reviews F.I.T.T. principles of aerobic exercise including progression. Written material given at graduation. Flowsheet Row Pulmonary Rehab from 11/03/2021 in [facility] Cardiac and Pulmonary Rehab  Date 10/13/21  Educator [facility]  Instruction Review Code 1- Verbalizes Understanding       Education: Resistance Exercise: - Group verbal and visual presentation on the components of exercise prescription. Introduces F.I.T.T principle from ACSM for exercise prescriptions  Reviews F.I.T.T. principles of resistance exercise including progression. Written material given at graduation.    Education: Exercise & Equipment Safety: - Individual verbal instruction  and demonstration of equipment use and safety with use of the equipment. Flowsheet Row Pulmonary  Rehab from 11/03/2021 in [facility] Cardiac and Pulmonary Rehab  Date 06/01/21  Educator [facility]  Instruction Review Code 1- Verbalizes Understanding       Education: Exercise Physiology & General Exercise Guidelines: - Group verbal and written instruction with models to review the exercise physiology of the cardiovascular system and associated critical values. Provides general exercise guidelines with specific guidelines to those with heart or lung disease.  Flowsheet Row Pulmonary Rehab from 11/03/2021 in [facility] Cardiac and Pulmonary Rehab  Date 08/04/21  Educator [facility]  Instruction Review Code 1- Verbalizes Understanding       Education: Flexibility, Balance, Mind/Body Relaxation: - Group verbal and visual presentation with interactive activity on the components of exercise prescription. Introduces F.I.T.T principle from ACSM for exercise prescriptions. Reviews F.I.T.T. principles of flexibility and balance exercise training including progression. Also discusses the mind body connection.  Reviews various relaxation techniques to help reduce and manage stress (i.e. Deep breathing, progressive muscle relaxation, and visualization). Balance handout provided to take home. Written material given at graduation. Flowsheet Row Pulmonary Rehab from 11/03/2021 in [facility] Cardiac and Pulmonary Rehab  Date 10/27/21  Educator AS  Instruction Review Code 1- Verbalizes Understanding       Activity Barriers & Risk Stratification:  Activity Barriers & Cardiac Risk Stratification - 06/13/21 1203       Activity Barriers & Cardiac Risk Stratification   Activity Barriers Shortness of Breath;Deconditioning;Back Problems;Muscular Weakness;Balance Concerns;Assistive Device;Other (comment)    Comments neuropathy in feet             6 Minute Walk:  6 Minute Walk     Row Name 06/13/21 1206 10/27/21 1145       6 Minute Walk   Phase Initial Discharge    Distance 620 feet 762 feet    Distance % Change -- 22.9 %     Distance Feet Change -- 142 ft    Walk Time 4.5 minutes 5.75 minutes    # of Rest Breaks 3  1:57-2:15; 2:28-3:13; 4:09-4:44 1    MPH 1.17 1.5    METS 1.69 1.63    RPE 15 12    Perceived Dyspnea  3 2    VO2 Peak 5.93 5.74    Symptoms Yes (comment) Yes (comment)    Comments Lower back pain 6/10, SOB hip pain 3/0    Resting HR 70 bpm 66 bpm    Resting BP 142/80 124/62    Resting Oxygen Saturation  94 % 98 %    Exercise Oxygen Saturation  during 6 min walk 84 % 92 %    Max Ex. HR 90 bpm 91 bpm    Max Ex. BP 170/80 126/64    2 Minute Post BP 130/82 124/60      Interval HR   1 Minute HR 89 66    2 Minute HR 88 82    3 Minute HR 88 93    4 Minute HR 90 87    5 Minute HR 75 90    6 Minute HR 85 91    2 Minute Post HR 68 74    Interval Heart Rate? Yes Yes      Interval Oxygen   Interval Oxygen? Yes Yes    Baseline Oxygen Saturation % 94 % 98 %    1 Minute Oxygen Saturation % 90 % 98 %    1 Minute Liters of Oxygen 3  L 3 L    2 Minute Oxygen Saturation % 88 % 95 %    2 Minute Liters of Oxygen 3 L 3 L    3 Minute Oxygen Saturation % 87 % 93 %    3 Minute Liters of Oxygen 3 L 3 L    4 Minute Oxygen Saturation % 84 % 94 %    4 Minute Liters of Oxygen 3 L 3 L    5 Minute Oxygen Saturation % 84 % 93 %    5 Minute Liters of Oxygen 3 L 3 L    6 Minute Oxygen Saturation % 85 % 92 %    6 Minute Liters of Oxygen 3 L 3 L    2 Minute Post Oxygen Saturation % 98 % 98 %    2 Minute Post Liters of Oxygen 3 L 3 L            Oxygen Initial Assessment:  Oxygen Initial Assessment - 06/13/21 1156       Home Oxygen   Home Oxygen Device Home Concentrator;E-Tanks    Sleep Oxygen Prescription Continuous    Liters per minute 3    Home Exercise Oxygen Prescription Continuous    Liters per minute 3    Home Resting Oxygen Prescription Continuous    Liters per minute 3    Compliance with Home Oxygen Use Yes      Initial 6 min Walk   Oxygen Used Continuous    Liters per minute 3       Program Oxygen Prescription   Program Oxygen Prescription Continuous    Liters per minute 3      Intervention   Short Term Goals To learn and exhibit compliance with exercise, home and travel O2 prescription;To learn and understand importance of monitoring SPO2 with pulse oximeter and demonstrate accurate use of the pulse oximeter.;To learn and understand importance of maintaining oxygen saturations>88%;To learn and demonstrate proper pursed lip breathing techniques or other breathing techniques. ;To learn and demonstrate proper use of respiratory medications    Long  Term Goals Exhibits compliance with exercise, home  and travel O2 prescription;Verbalizes importance of monitoring SPO2 with pulse oximeter and return demonstration;Maintenance of O2 saturations>88%;Exhibits proper breathing techniques, such as pursed lip breathing or other method taught during program session;Compliance with respiratory medication;Demonstrates proper use of MDI's             Oxygen Re-Evaluation:  Oxygen Re-Evaluation     Row Name 06/23/21 1133 07/07/21 1131 08/09/21 1122 09/08/21 1119 10/04/21 1128     Program Oxygen Prescription   Program Oxygen Prescription _0    Liters per minute _1 Home Oxygen   Home Oxygen Device Home Concentrator;E-Tanks Home Concentrator;E-Tanks Home Concentrator;E-Tanks Home Concentrator;E-Tanks Home Concentrator;E-Tanks   Sleep Oxygen Prescription _2    Liters per minute _3 Home Exercise Oxygen Prescription _4    Liters per minute _5 Home Resting Oxygen Prescription _6    Liters per minute _7 Compliance with Home Oxygen Use _8      Goals/Expected Outcomes   Short Term Goals To learn and exhibit compliance with exercise,  home and travel O2 prescription;To learn and understand importance of monitoring SPO2 with pulse oximeter and demonstrate accurate use of the pulse oximeter.;To  learn and understand importance of maintaining oxygen saturations>88%;To learn and demonstrate proper pursed lip breathing techniques or other breathing techniques. ;To learn and demonstrate proper use of respiratory medications To learn and exhibit compliance with exercise, home and travel O2 prescription;To learn and understand importance of monitoring SPO2 with pulse oximeter and demonstrate accurate use of the pulse oximeter.;To learn and understand importance of maintaining oxygen saturations>88%;To learn and demonstrate proper pursed lip breathing techniques or other breathing techniques. ;To learn and demonstrate proper use of respiratory medications To learn and exhibit compliance with exercise, home and travel O2 prescription;To learn and understand importance of monitoring SPO2 with pulse oximeter and demonstrate accurate use of the pulse oximeter.;To learn and understand importance of maintaining oxygen saturations>88%;To learn and demonstrate proper pursed lip breathing techniques or other breathing techniques. ;To learn and demonstrate proper use of respiratory medications To learn and exhibit compliance with exercise, home and travel O2 prescription;To learn and understand importance of monitoring SPO2 with pulse oximeter and demonstrate accurate use of the pulse oximeter.;To learn and understand importance of maintaining oxygen saturations>88%;To learn and demonstrate proper pursed lip breathing techniques or other breathing techniques. ;To learn and demonstrate proper use of respiratory medications To learn and exhibit compliance with exercise, home and travel O2 prescription;To learn and understand importance of monitoring SPO2 with pulse oximeter and demonstrate accurate use of the pulse oximeter.;To learn and understand importance of  maintaining oxygen saturations>88%;To learn and demonstrate proper pursed lip breathing techniques or other breathing techniques. ;To learn and demonstrate proper use of respiratory medications   Long  Term Goals Exhibits compliance with exercise, home  and travel O2 prescription;Verbalizes importance of monitoring SPO2 with pulse oximeter and return demonstration;Maintenance of O2 saturations>88%;Exhibits proper breathing techniques, such as pursed lip breathing or other method taught during program session;Compliance with respiratory medication;Demonstrates proper use of MDI's Exhibits compliance with exercise, home  and travel O2 prescription;Verbalizes importance of monitoring SPO2 with pulse oximeter and return demonstration;Maintenance of O2 saturations>88%;Exhibits proper breathing techniques, such as pursed lip breathing or other method taught during program session;Compliance with respiratory medication;Demonstrates proper use of MDI's Exhibits compliance with exercise, home  and travel O2 prescription;Verbalizes importance of monitoring SPO2 with pulse oximeter and return demonstration;Maintenance of O2 saturations>88%;Exhibits proper breathing techniques, such as pursed lip breathing or other method taught during program session;Compliance with respiratory medication;Demonstrates proper use of MDI's Exhibits compliance with exercise, home  and travel O2 prescription;Verbalizes importance of monitoring SPO2 with pulse oximeter and return demonstration;Maintenance of O2 saturations>88%;Exhibits proper breathing techniques, such as pursed lip breathing or other method taught during program session;Compliance with respiratory medication;Demonstrates proper use of MDI's Exhibits compliance with exercise, home  and travel O2 prescription;Verbalizes importance of monitoring SPO2 with pulse oximeter and return demonstration;Maintenance of O2 saturations>88%;Exhibits proper breathing techniques, such as pursed  lip breathing or other method taught during program session;Compliance with respiratory medication;Demonstrates proper use of MDI's   Comments Reviewed PLB technique with pt.  Talked about how it works and it's importance in maintaining their exercise saturations. Darius Norris is doing well with his oxygen and staying compliant. He is doing well with his inhalers and using his PLB routinely.  He is good about checking his sats and will now start to check it during exercise at home. Darius Norris is doing well with his oxygen.  He does well with his inhalers and feels pretty good breathing wise.  He will only use his nebulizer if feeling bad.  He is pleased that he has not had  an exacerbation recently.  His sats are staying consistent as well. Darius Norris continues to have good complinace with his oxygen.  He is doing well wihth his breathing and feels good overall.  He continues to keep a close eye on his sats as well. Darius Norris continues to have good complinace with his oxygen.  He is doing well wihth his breathing and feels good overall, but he is having difficulty with sinus drainae at night with limits his O2 until he can cough it up. He has not told his doctor yet, but he plans to make an appointment becuase he is having problems with his hip as well.  He continues to keep a close eye on his sats as well with his pulse ox.   Goals/Expected Outcomes Short: Become more profiecient at using PLB.   Long: Become independent at using PLB. Short: Continue to montior oxygen as he adds in more exercise Long: Conitnued compliance Short: Continue to improve breathing Long; Continue to use oxygen as prescibed. Short: Continue to improve breathing Long; Continue to use oxygen as prescibed. Short: Continue to improve breathing, let MD know about sinus buildup Long; Continue to use oxygen as prescibed.            Oxygen Discharge (Final Oxygen Re-Evaluation):  Oxygen Re-Evaluation - 10/04/21 1128       Program Oxygen Prescription   Program  Oxygen Prescription Continuous    Liters per minute 3      Home Oxygen   Home Oxygen Device Home Concentrator;E-Tanks    Sleep Oxygen Prescription Continuous    Liters per minute 3    Home Exercise Oxygen Prescription Continuous    Liters per minute 3    Home Resting Oxygen Prescription Continuous    Liters per minute 3    Compliance with Home Oxygen Use Yes      Goals/Expected Outcomes   Short Term Goals To learn and exhibit compliance with exercise, home and travel O2 prescription;To learn and understand importance of monitoring SPO2 with pulse oximeter and demonstrate accurate use of the pulse oximeter.;To learn and understand importance of maintaining oxygen saturations>88%;To learn and demonstrate proper pursed lip breathing techniques or other breathing techniques. ;To learn and demonstrate proper use of respiratory medications    Long  Term Goals Exhibits compliance with exercise, home  and travel O2 prescription;Verbalizes importance of monitoring SPO2 with pulse oximeter and return demonstration;Maintenance of O2 saturations>88%;Exhibits proper breathing techniques, such as pursed lip breathing or other method taught during program session;Compliance with respiratory medication;Demonstrates proper use of MDI's    Comments Darius Norris continues to have good complinace with his oxygen.  He is doing well wihth his breathing and feels good overall, but he is having difficulty with sinus drainae at night with limits his O2 until he can cough it up. He has not told his doctor yet, but he plans to make an appointment becuase he is having problems with his hip as well.  He continues to keep a close eye on his sats as well with his pulse ox.    Goals/Expected Outcomes Short: Continue to improve breathing, let MD know about sinus buildup Long; Continue to use oxygen as prescibed.             Initial Exercise Prescription:  Initial Exercise Prescription - 06/13/21 1200       Date of Initial  Exercise RX and Referring Provider   Date 06/13/21    Referring Provider Jenne Pane, MD ([place])  Oxygen   Oxygen Continuous    Liters 3      Treadmill   MPH 0.8    Grade 0    Minutes 15    METs 1.6      NuStep   Level 1    SPM 80    Minutes 15    METs 1.6      REL-XR   Level 1    Speed 50    Minutes 15    METs 1.6      Prescription Details   Frequency (times per week) 2    Duration Progress to 30 minutes of continuous aerobic without signs/symptoms of physical distress      Intensity   THRR 40-80% of Max Heartrate 102-134    Ratings of Perceived Exertion 11-13    Perceived Dyspnea 0-4      Progression   Progression Continue to progress workloads to maintain intensity without signs/symptoms of physical distress.      Resistance Training   Training Prescription Yes    Weight 3 lb    Reps 10-15             Perform Capillary Blood Glucose checks as needed.  Exercise Prescription Changes:   Exercise Prescription Changes     Row Name 06/13/21 1200 06/29/21 1300 07/07/21 1100 07/12/21 0900 07/27/21 1600     Response to Exercise   Blood Pressure (Admit) 142/80 132/64 -- 138/64 126/62   Blood Pressure (Exercise) 170/80 122/64 -- 164/80 --   Blood Pressure (Exit) 130/82 134/66 -- 126/56 112/56   Heart Rate (Admit) 70 bpm 64 bpm -- 75 bpm 67 bpm   Heart Rate (Exercise) 90 bpm 76 bpm -- 975 bpm 82 bpm   Heart Rate (Exit) 68 bpm 71 bpm -- 70 bpm 69 bpm   Oxygen Saturation (Admit) 94 % 92 % -- 96 % 95 %   Oxygen Saturation (Exercise) 84 % 89 % -- 97 % 96 %   Oxygen Saturation (Exit) 98 % 98 % -- 98 % 97 %   Rating of Perceived Exertion (Exercise) 15 12 -- 12 12   Perceived Dyspnea (Exercise) 3 1 -- 2 2   Symptoms Back pain 6/10, SOB none -- SOB SOB   Comments walk test results first full day of exercise -- -- --   Duration -- Progress to 30 minutes of  aerobic without signs/symptoms of physical distress -- Continue with 30 min of aerobic exercise without  signs/symptoms of physical distress. Continue with 30 min of aerobic exercise without signs/symptoms of physical distress.   Intensity -- THRR unchanged -- THRR unchanged THRR unchanged     Progression   Progression -- Continue to progress workloads to maintain intensity without signs/symptoms of physical distress. -- Continue to progress workloads to maintain intensity without signs/symptoms of physical distress. Continue to progress workloads to maintain intensity without signs/symptoms of physical distress.   Average METs -- 1.52 -- 2.32 2.6     Resistance Training   Training Prescription -- Yes -- Yes Yes   Weight -- 3 lb -- 3 lb 3 lb   Reps -- 10-15 -- 10-15 10-15     Interval Training   Interval Training -- -- -- No No     Oxygen   Oxygen -- Continuous -- Continuous Continuous   Liters -- 3 -- 3 3     Treadmill   MPH -- 1 -- 1 1.2   Grade -- 0 -- 0 0  Minutes -- 15 -- 15 15   METs -- 1.8 -- 1.77 1.92     NuStep   Level -- -- -- 3 --   Minutes -- -- -- 15 --   METs -- -- -- 2 --     REL-XR   Level -- 1 -- 2 2   Minutes -- 15 -- 15 15   METs -- 1.2 -- 3.2 3.3     Home Exercise Plan   Plans to continue exercise at -- -- Home (comment)  walking, treadmill, staff videos Home (comment)  walking, treadmill, staff videos Home (comment)  walking, treadmill, staff videos   Frequency -- -- Add 1 additional day to program exercise sessions. Add 1 additional day to program exercise sessions. Add 1 additional day to program exercise sessions.   Initial Home Exercises Provided -- -- 07/07/21 07/07/21 07/07/21    Row Name 08/08/21 1600 08/24/21 1600 09/05/21 1100 09/20/21 0900 10/03/21 1400     Response to Exercise   Blood Pressure (Admit) 122/76 122/66 126/62 -- 126/80   Blood Pressure (Exit) 128/70 106/64 120/64 138/68 140/68   Heart Rate (Admit) 69 bpm 66 bpm 71 bpm 68 bpm 80 bpm   Heart Rate (Exercise) 84 bpm 81 bpm 79 bpm 78 bpm 95 bpm   Heart Rate (Exit) 69 bpm 72 bpm 72  bpm 69 bpm 72 bpm   Oxygen Saturation (Admit) 98 % 97 % 98 % 98 % 88 %   Oxygen Saturation (Exercise) 97 % 98 % 95 % 98 % 90 %   Oxygen Saturation (Exit) 98 % 98 % 96 % 99 % 97 %   Rating of Perceived Exertion (Exercise) _0 Perceived Dyspnea (Exercise) _1 Symptoms _2    Duration Continue with 30 min of aerobic exercise without signs/symptoms of physical distress. Continue with 30 min of aerobic exercise without signs/symptoms of physical distress. Continue with 30 min of aerobic exercise without signs/symptoms of physical distress. Continue with 30 min of aerobic exercise without signs/symptoms of physical distress. Continue with 30 min of aerobic exercise without signs/symptoms of physical distress.   Intensity _3      Progression   Progression Continue to progress workloads to maintain intensity without signs/symptoms of physical distress. Continue to progress workloads to maintain intensity without signs/symptoms of physical distress. -- Continue to progress workloads to maintain intensity without signs/symptoms of physical distress. Continue to progress workloads to maintain intensity without signs/symptoms of physical distress.   Average METs 2.26 2.5 2.33 2.07 2.32     Resistance Training   Training Prescription _4    Weight 3 lb 3 lb 3 lb 3 lb 4 lb   Reps 10-15 10-15 10-15 10-15 10-15     Interval Training   Interval Training _5      Oxygen   Oxygen _6    Liters _7 Treadmill   MPH 1.4 1.4 1.4 1.4 1.4   Grade 0 0 0 0 0   Minutes _8 METs 2.07 2.07 -- -- 2.07     NuStep   Level -- 5 -- -- --   Minutes -- 15 -- -- --   METs -- 3 -- -- --     Recumbant Elliptical  Level 1 -- 1 -- 1   Minutes 15 -- 15 -- 15   METs 1.6 -- 1.6 -- 1.6     REL-XR   Level 2 -- _0 Minutes 15 -- _1 METs 3.1 -- 3.6 -- 2.2     Home Exercise Plan   Plans to continue exercise at Home (comment)  walking, treadmill, staff videos Home (comment)  walking, treadmill, staff videos Home (comment)  walking, treadmill, staff videos Home (comment)  walking, treadmill, staff videos Home (comment)  walking, treadmill, staff videos   Frequency Add 1 additional day to program exercise sessions. Add 1 additional day to program exercise sessions. Add 1 additional day to program exercise sessions. Add 1 additional day to program exercise sessions. Add 1 additional day to program exercise sessions.   Initial Home Exercises Provided 07/07/21 07/07/21 07/07/21 07/07/21 07/07/21     Oxygen   Maintain Oxygen Saturation -- 88% or higher 88% or higher 88% or higher 88% or higher    Row Name 10/19/21 1300 10/31/21 1100 11/15/21 1300         Response to Exercise   Blood Pressure (Admit) 138/68 124/62 126/70     Blood Pressure (Exercise) -- 126/64 --     Blood Pressure (Exit) 122/60 122/56 138/62     Heart Rate (Admit) 72 bpm 66 bpm 70 bpm     Heart Rate (Exercise) 78 bpm 93 bpm 82 bpm     Heart Rate (Exit) 67 bpm 71 bpm 76 bpm     Oxygen Saturation (Admit) 97 % 98 % 97 %     Oxygen Saturation (Exercise) 95 % 92 % 95 %     Oxygen Saturation (Exit) 95 % 98 % 98 %     Rating of Perceived Exertion (Exercise) _2 Perceived Dyspnea (Exercise) _3 Symptoms -- SOB SOB     Duration Continue with 30 min of aerobic exercise without signs/symptoms of physical distress. Continue with 30 min of aerobic exercise without signs/symptoms of physical distress. Continue with 30 min of aerobic exercise without signs/symptoms of physical distress.     Intensity THRR unchanged THRR unchanged THRR unchanged       Progression   Progression Continue to progress workloads to maintain intensity without signs/symptoms of physical distress. Continue to progress workloads to maintain intensity  without signs/symptoms of physical distress. Continue to progress workloads to maintain intensity without signs/symptoms of physical distress.     Average METs 2.74 2.48 2.8       Resistance Training   Training Prescription Yes Yes Yes     Weight 4 lb 4 lb 4 lb     Reps 10-15 10-15 10-15       Interval Training   Interval Training No No No       Oxygen   Oxygen Continuous Continuous Continuous     Liters _4 Treadmill   MPH 1.4 1.4 1.5     Grade 0 0 0     Minutes _5 METs 2.07 2.07 2.15       NuStep   Level -- 4 --     Minutes -- 15 --       REL-XR   Level _6 Minutes _7 METs 3.4 3.3  3.5       Home Exercise Plan   Plans to continue exercise at Home (comment)  walking, treadmill, staff videos Home (comment)  walking, treadmill, staff videos Home (comment)  walking, treadmill, staff videos     Frequency Add 1 additional day to program exercise sessions. Add 1 additional day to program exercise sessions. Add 1 additional day to program exercise sessions.     Initial Home Exercises Provided 07/07/21 07/07/21 07/07/21       Oxygen   Maintain Oxygen Saturation 88% or higher 88% or higher 88% or higher              Exercise Comments:   Exercise Comments     Row Name 11/17/21 1102           Exercise Comments Nashaun graduated today from  rehab with 36 sessions completed.  Details of the patient's exercise prescription and what He needs to do in order to continue the prescription and progress were discussed with patient.  Patient was given a copy of prescription and goals.  Patient verbalized understanding.  Raynold plans to continue to exercise by walking, using the treadmill, and using staff videos.                Exercise Goals and Review:   Exercise Goals     Row Name 06/13/21 1233             Exercise Goals   Increase Physical Activity Yes       Intervention Provide advice, education, support and counseling about  physical activity/exercise needs.;Develop an individualized exercise prescription for aerobic and resistive training based on initial evaluation findings, risk stratification, comorbidities and participant's personal goals.       Expected Outcomes Short Term: Attend rehab on a regular basis to increase amount of physical activity.;Long Term: Add in home exercise to make exercise part of routine and to increase amount of physical activity.;Long Term: Exercising regularly at least 3-5 days a week.       Increase Strength and Stamina Yes       Intervention Provide advice, education, support and counseling about physical activity/exercise needs.;Develop an individualized exercise prescription for aerobic and resistive training based on initial evaluation findings, risk stratification, comorbidities and participant's personal goals.       Expected Outcomes Short Term: Increase workloads from initial exercise prescription for resistance, speed, and METs.;Short Term: Perform resistance training exercises routinely during rehab and add in resistance training at home;Long Term: Improve cardiorespiratory fitness, muscular endurance and strength as measured by increased METs and functional capacity (6MWT)       Able to understand and use rate of perceived exertion (RPE) scale Yes       Intervention Provide education and explanation on how to use RPE scale       Expected Outcomes Short Term: Able to use RPE daily in rehab to express subjective intensity level;Long Term:  Able to use RPE to guide intensity level when exercising independently       Able to understand and use Dyspnea scale Yes       Intervention Provide education and explanation on how to use Dyspnea scale       Expected Outcomes Short Term: Able to use Dyspnea scale daily in rehab to express subjective sense of shortness of breath during exertion;Long Term: Able to use Dyspnea scale to guide intensity level when exercising independently       Knowledge  and understanding of Target Heart Rate  Range (THRR) Yes       Intervention Provide education and explanation of THRR including how the numbers were predicted and where they are located for reference       Expected Outcomes Short Term: Able to state/look up THRR;Short Term: Able to use daily as guideline for intensity in rehab;Long Term: Able to use THRR to govern intensity when exercising independently       Able to check pulse independently Yes       Intervention Provide education and demonstration on how to check pulse in carotid and radial arteries.;Review the importance of being able to check your own pulse for safety during independent exercise       Expected Outcomes Short Term: Able to explain why pulse checking is important during independent exercise;Long Term: Able to check pulse independently and accurately       Understanding of Exercise Prescription Yes       Intervention Provide education, explanation, and written materials on patient's individual exercise prescription       Expected Outcomes Short Term: Able to explain program exercise prescription;Long Term: Able to explain home exercise prescription to exercise independently                Exercise Goals Re-Evaluation :  Exercise Goals Re-Evaluation     Row Name 06/23/21 1132 06/29/21 1315 07/07/21 1118 07/12/21 0903 07/27/21 1605     Exercise Goal Re-Evaluation   Exercise Goals Review Increase Physical Activity;Able to understand and use rate of perceived exertion (RPE) scale;Knowledge and understanding of Target Heart Rate Range (THRR);Understanding of Exercise Prescription;Increase Strength and Stamina;Able to understand and use Dyspnea scale;Able to check pulse independently Increase Physical Activity;Increase Strength and Stamina Increase Physical Activity;Increase Strength and Stamina;Understanding of Exercise Prescription Increase Physical Activity;Increase Strength and Stamina;Understanding of Exercise Prescription  Increase Physical Activity;Increase Strength and Stamina   Comments Reviewed RPE and dyspnea scales, THR and program prescription with pt today.  Pt voiced understanding and was given a copy of goals to take home. Darius Norris is doing well for his first couple of sessions here at rehab. He has already increased his speed on the treadmill to 1.0 speed. Will continue to monitor for progression. Darius Norris is doing well in rehab.  He has bought a new treadmill to use at home.  He has started some but would like to get into it more.  Reviewed home exercise with pt today.  Pt plans to walking and treadmill for exercise.  We also talked about adding in some staff videos. Reviewed THR, pulse, RPE, sign and symptoms, pulse oximetery and when to call 911 or MD.  Also discussed weather considerations and indoor options.  Pt voiced understanding. Darius Norris is doing well in rehab.  He has completed his first four full days of exercise and is up to 3.2 METs on the XR.  We will continue to monitor his progress. Darius Norris is doing well.  Oxygen stays in the 90s durign sessions.  We will continue to monitor progress.   Expected Outcomes Short: Use RPE daily to regulate intensity. Long: Follow program prescription in THR. Short: Continue to increase loads on exercise and tolerate full 30 minutes Long: Continue to build overall strength and stamina Short: Start to add in more exercise at home Long: Continue to improve stamina. Short: Try a little incline on treadmill Long: Continue to improve stamina and follow program prescription. Short: attend consistently  Long:  build overll stamina    Row Name 08/08/21 1641 08/09/21 1113 08/24/21  1609 09/05/21 1133 09/08/21 1113     Exercise Goal Re-Evaluation   Exercise Goals Review Increase Physical Activity;Increase Strength and Stamina;Understanding of Exercise Prescription Increase Physical Activity;Increase Strength and Stamina;Understanding of Exercise Prescription Increase Physical Activity;Increase  Strength and Stamina Increase Physical Activity;Increase Strength and Stamina;Understanding of Exercise Prescription Increase Physical Activity;Increase Strength and Stamina;Understanding of Exercise Prescription   Comments Darius Norris is doing well in rehab.  He up to 1.4 mph on the treadmill.  We will continue to monitor his progress. Darius Norris is doing well in rehab.  He fell last week and hurting all over from his bruises. He tries to stay busy doing house work on his off days. He was feeling better prior to falling. Darius Norris attends consistently.  His oxygen stays in the 90s.  Staff will encourage trying 4 lb for strength work. Mike's oxygen is well maintained above 88% during his exercise sessions. He should be encouraged to increase his speed on the treadmill as tolerated. RPEs are appropriate. Will continue to monitor. Darius Norris was out earlier this week with bursitis in his elbow.  He is hurting all over this week.  He is doing what he can.  He has not been doing his exercise at home, but is able to stay busier now. He is slowly building back.   Expected Outcomes Short: Try 4 lb weights Long: COntinue to improve stamina Short: Recover from fall and start to add in more at home Long; Conitnue to improve stamina. Short: maintain consistent attendance Long: build overall stamina Short: Inccrease load on the treadmill Long: Continue to increase overall MET level Short: Continue to move more again Long: continue to improve stamina.    [place] Name 09/20/21 4332 09/20/21 0909 10/03/21 1436 10/04/21 1133 10/19/21 1315     Exercise Goal Re-Evaluation   Exercise Goals Review Increase Physical Activity;Increase Strength and Stamina -- Increase Physical Activity;Increase Strength and Stamina;Understanding of Exercise Prescription Increase Physical Activity;Increase Strength and Stamina;Understanding of Exercise Prescription Increase Physical Activity;Increase Strength and Stamina   Comments Mike's oxygen stays on the 90s when on 3L/m.   Staff will encourage trying level 3 on XR and 4 lb for strength work.  He would benefit from stretching on days not at [facility]. He has had home exercise and staff will review stretching. Darius Norris has been doing well in rehab.  He is on level 2 of the XR with 3.3 METs.  We will continue to monitor his progress. Darius Norris reports not exercising outside of rehab. He plans on walking on his treadmill at home after rehab - he plans to do this in the morning after breakfast. He has 3lb weights at home he hasn't used. Darius Norris attended 6 times in October.  More consistent exercise would allow him to progress more.  Staff will encourage trying to move levels up on machines.   Expected Outcomes -- Short: add stretching at home Long: improve overall stamina Short: Continue to move up workloads Long: Continue to improve stamina Short: create home exercise routine to continue after graduating rehab Long: Continue to improve stamina Short:exercise 1-2 times at home outside class Long:improve overall stamina    Row Name 10/31/21 1126             Exercise Goal Re-Evaluation   Exercise Goals Review Increase Physical Activity;Increase Strength and Stamina       Comments Darius Norris is nearing graduation for [employer]. He completed his post 6MWT and improved by 142 feet! He did increase his XR to level 3. We will  continue to monitor his progression until graduation.       Expected Outcomes Short: Continue to increase treadmill loads as tolerated and graduate Long: Continue to increase overall MET level                Discharge Exercise Prescription (Final Exercise Prescription Changes):  Exercise Prescription Changes - 11/15/21 1300       Response to Exercise   Blood Pressure (Admit) 126/70    Blood Pressure (Exit) 138/62    Heart Rate (Admit) 70 bpm    Heart Rate (Exercise) 82 bpm    Heart Rate (Exit) 76 bpm    Oxygen Saturation (Admit) 97 %    Oxygen Saturation (Exercise) 95 %    Oxygen Saturation (Exit) 98 %    Rating of  Perceived Exertion (Exercise) 12    Perceived Dyspnea (Exercise) 3    Symptoms SOB    Duration Continue with 30 min of aerobic exercise without signs/symptoms of physical distress.    Intensity THRR unchanged      Progression   Progression Continue to progress workloads to maintain intensity without signs/symptoms of physical distress.    Average METs 2.8      Resistance Training   Training Prescription Yes    Weight 4 lb    Reps 10-15      Interval Training   Interval Training No      Oxygen   Oxygen Continuous    Liters 3      Treadmill   MPH 1.5    Grade 0    Minutes 15    METs 2.15      REL-XR   Level 3    Minutes 15    METs 3.5      Home Exercise Plan   Plans to continue exercise at Home (comment)   walking, treadmill, staff videos   Frequency Add 1 additional day to program exercise sessions.    Initial Home Exercises Provided 07/07/21      Oxygen   Maintain Oxygen Saturation 88% or higher             Nutrition:  Target Goals: Understanding of nutrition guidelines, daily intake of sodium <1584m, cholesterol <2031m calories 30% from fat and 7% or less from saturated fats, daily to have 5 or more servings of fruits and vegetables.  Education: All About Nutrition: -Group instruction provided by verbal, written material, interactive activities, discussions, models, and posters to present general guidelines for heart healthy nutrition including fat, fiber, MyPlate, the role of sodium in heart healthy nutrition, utilization of the nutrition label, and utilization of this knowledge for meal planning. Follow up email sent as well. Written material given at graduation. Flowsheet Row [facility]  Date 11/03/21  [facility] Code 1- Verbalizes Understanding       Biometrics:  Pre Biometrics - 06/13/21 1202       Pre Biometrics   Height 5' 5.75" (1.67 m)    Weight 192 lb 11.2 oz  (87.4 kg)    BMI (Calculated) 31.34    Single Leg Stand 1.97 seconds              Nutrition Therapy Plan and Nutrition Goals:  Nutrition Therapy & Goals - 10/04/21 1120       Nutrition Therapy   RD appointment deferred Yes   MiRonalee Beltsould still not like to talk to the RD regarding nutrition  Nutrition Assessments:  MEDIFICTS Score Key: ?70 Need to make dietary changes  40-70 Heart Healthy Diet ? 40 Therapeutic Level Cholesterol Diet  Flowsheet Row Pulmonary Rehab from 06/13/2021 in [facility]  Picture Your Plate Total Score on Admission 60      Picture Your Plate Scores: <01 Unhealthy dietary pattern with much room for improvement. 41-50 Dietary pattern unlikely to meet recommendations for good health and room for improvement. 51-60 More healthful dietary pattern, with some room for improvement.  >60 Healthy dietary pattern, although there may be some specific behaviors that could be improved.   Nutrition Goals Re-Evaluation:  Nutrition Goals Re-Evaluation     [place] Name 08/09/21 1120 09/08/21 1116 10/04/21 1122 11/03/21 1117       Goals   Nutrition Goal Decrease soda intake, Lose weight Short: Continue to cut back on salt Long: COnitnue work on weight loss ST: limit fast food to <2x/week LT: limit Na to <1.5g --    Comment Darius Norris is doing well in rehab.  He and his wife do not cook much and eat a lot of sandwiches during the week.  He does try to cook more on the weekend.  He is usually eating biscuits for breakfast, but he is good about getting lots of fruit in his diet. He continue to try to make healthier choices.  He has cut back on his salt and knows he could do better.  He has now switched to diet [employer] and is sticking with them. Darius Norris is doing well.  He is doing pretty good with his diet.  He is still eating out a lot but otherwise trying to cut back on his salt.  He is mostly meat and potatoes and occasionally fruit and vegetables.  Darius Norris reports doing well with his diet and is working on cutting back salt on his own and cutting back fast food, he would still not like to meet with RD for a consultation. Darius Norris still works on limiting fast food and sodium.  He says his wife doesnt cook and thats why he eats fast food    Expected Outcome Short: Continue to cut back on salt Long: COnitnue work on weight loss Short: Cut back on fast food.  Long: Continue to work on weight loss ST: limit fast food to <2x/week LT: limit Na to <1.5g ST/LT: conitnue to monitor sodium and limit fast food             Nutrition Goals Discharge (Final Nutrition Goals Re-Evaluation):  Nutrition Goals Re-Evaluation - 11/03/21 1117       Goals   Comment Darius Norris still works on limiting fast food and sodium.  He says his wife doesnt cook and thats why he eats fast food    Expected Outcome ST/LT: conitnue to monitor sodium and limit fast food             Psychosocial: Target Goals: Acknowledge presence or absence of significant depression and/or stress, maximize coping skills, provide positive support system. Participant is able to verbalize types and ability to use techniques and skills needed for reducing stress and depression.   Education: Stress, Anxiety, and Depression - Group verbal and visual presentation to define topics covered.  Reviews how body is impacted by stress, anxiety, and depression.  Also discusses healthy ways to reduce stress and to treat/manage anxiety and depression.  Written material given at graduation. Flowsheet Row Pulmonary Rehab from 11/03/2021 in [facility] Pulmonary Re[facility]  Date 07/28/21  Educator  [facility]  Instruction Review Code 1- Verbalizes Understanding       Education: Sleep Hygiene -Provides group verbal and written instruction about how sleep can affect your health.  Define sleep hygiene, discuss sleep cycles and impact of sleep habits. Review good sleep hygiene tips.    Initial Review & Psychosocial  Screening:  Initial Psych Review & Screening - 06/01/21 1557       Initial Review   Current issues with Current Sleep Concerns;Current Stress Concerns    Source of Stress Concerns Chronic Illness    Comments He has panic with shortness of breath at times.      Family Dynamics   Good Support System? Yes    Comments He can look to his wife and his two sisters for support.      Barriers   Psychosocial barriers to participate in program There are no identifiable barriers or psychosocial needs.;The patient should benefit from training in stress management and relaxation.      Screening Interventions   Interventions Encouraged to exercise;To provide support and resources with identified psychosocial needs;Provide feedback about the scores to participant    Expected Outcomes Short Term goal: Utilizing psychosocial counselor, staff and physician to assist with identification of specific Stressors or current issues interfering with healing process. Setting desired goal for each stressor or current issue identified.;Long Term Goal: Stressors or current issues are controlled or eliminated.;Short Term goal: Identification and review with participant of any Quality of Life or Depression concerns found by scoring the questionnaire.;Long Term goal: The participant improves quality of Life and PHQ9 Scores as seen by post scores and/or verbalization of changes             Quality of Life Scores:  Scores of 19 and below usually indicate a poorer quality of life in these areas.  A difference of  2-3 points is a clinically meaningful difference.  A difference of 2-3 points in the total score of the Quality of Life Index has been associated with significant improvement in overall quality of life, self-image, physical symptoms, and general health in studies assessing change in quality of life.  PHQ-9: Recent Review Flowsheet Data     Depression screen [facility] 2/9 11/17/2021 06/13/2021 02/26/2019 01/29/2019  12/23/2018   Decreased Interest 1 0 1 0 2   Down, Depressed, Hopeless 0 0 0 0 0   PHQ - 2 Score 1 0 1 0 2   Altered sleeping 2 0 _0 Tired, decreased energy _1 Change in appetite 1 0 _2 Feeling bad or failure about yourself  0 0 0 0 0   Trouble concentrating 0 0 0 0 0   Moving slowly or fidgety/restless 0 0 0 0 1   Suicidal thoughts 0 0 0 0 0   PHQ-9 Score _3 Difficult doing work/chores Somewhat difficult - Somewhat difficult Somewhat difficult Very difficult      Interpretation of Total Score  Total Score Depression Severity:  1-4 = Minimal depression, 5-9 = Mild depression, 10-14 = Moderate depression, 15-19 = Moderately severe depression, 20-27 = Severe depression   Psychosocial Evaluation and Intervention:  Psychosocial Evaluation - 06/01/21 1557       Psychosocial Evaluation & Interventions   Interventions Encouraged to exercise with the program and follow exercise prescription;Stress management education;Relaxation education    Comments He can look to his wife and his two sisters for  support.He has panic with shortness of breath at times.    Expected Outcomes Short: Exercise regularly to support mental health and notify staff of any changes. Long: maintain mental health and well being through teaching of rehab or prescribed medications independently.    Continue Psychosocial Services  Follow up required by staff             Psychosocial Re-Evaluation:  Psychosocial Re-Evaluation     [place] Name 07/07/21 1127 08/09/21 1118 09/08/21 1115 10/04/21 1135 11/03/21 1118     Psychosocial Re-Evaluation   Current issues with Current Stress Concerns;Current Sleep Concerns Current Stress Concerns;Current Sleep Concerns Current Stress Concerns;Current Sleep Concerns Current Stress Concerns;Current Sleep Concerns --   Comments Darius Norris is doing well in rehab.  He is feeling pretty good mentally.  His wife had another knee replacement but she is now doing better  and is back to work.  He continues to struggle with his sleep.  He gets about 3-4 hrs at night and will take a nap in the afternoon.  He is usually up to 2-3am.  Overall, he is averaging about 6 hrs a day total. Darius Norris is doing well in rehab.  He is holding up fairly well mentally.  He wishes he could see his grandkids again as its been almost a year since he last saw then.  He had a fall out with their mother and has cut them off some.  He is still sleeping about the same and up and down all night.  He does aim for his afternoon nap. Darius Norris is holding up well in rehab. He has been dealing with bursitis this week and hurting all over.  His sleep is about the same.  He still misses his grandkids. He reports no stress right. He has bought his wife a new car Friday. He is still dealing with the bursitis, but it is not as bad. He still has not seen his grandchildren stay in touch on FB and now he has a new great-grandchild. He continues to sleep the same as he has been - he gets 4 hours continuous sleep per night, but still manages to get a full 8 hours. Darius Norris says he sleeps fairly good.  He is still recovering from a fall.  He reports no other symptoms of depression or anxiety.   Expected Outcomes Short: Continue to work on sleep  LOng: COntinue to stay positive Short: Continue to try to work on things with seeing grandkids Long: Continue to focus on positive. Short: Continue to try to work on things with seeing grandkids Long: Continue to focus on positive. Short: Continue to stay positive and stay in contact with family Long: Continue to focus on positive. Short/Long:  continue to stay positive   Interventions Encouraged to attend Pulmonary Rehabilitation for the exercise Encouraged to attend Pulmonary Rehabilitation for the exercise Encouraged to attend Pulmonary Rehabilitation for the exercise Encouraged to attend Pulmonary Rehabilitation for the exercise --   Continue Psychosocial Services  -- Follow up required by  staff -- Follow up required by staff --     Initial Review   Source of Stress Concerns -- -- -- Chronic Illness --            Psychosocial Discharge (Final Psychosocial Re-Evaluation):  Psychosocial Re-Evaluation - 11/03/21 1118       Psychosocial Re-Evaluation   Comments Darius Norris says he sleeps fairly good.  He is still recovering from a fall.  He reports no other symptoms of depression or  anxiety.    Expected Outcomes Short/Long:  continue to stay positive             Education: Education Goals: Education classes will be provided on a weekly basis, covering required topics. Participant will state understanding/return demonstration of topics presented.  Learning Barriers/Preferences:  Learning Barriers/Preferences - 06/01/21 1555       Learning Barriers/Preferences   Learning Barriers None    Learning Preferences Group Instruction;Individual Instruction;Pictoral;Skilled Demonstration;Verbal Instruction;Video;Written Material             General Pulmonary Education Topics:  Infection Prevention: - Provides verbal and written material to individual with discussion of infection control including proper hand washing and proper equipment cleaning during exercise session. Flowsheet Row Pulmonary Rehab from 11/03/2021 in [facility] Cardiac and Pulmonary Rehab  Date 06/01/21  Educator [facility]  Instruction Review Code 1- Verbalizes Understanding       Falls Prevention: - Provides verbal and written material to individual with discussion of falls prevention and safety. Flowsheet Row Pulmonary Rehab from 11/03/2021 in [facility] Cardiac and Pulmonary Rehab  Date 06/01/21  Educator [facility]  Instruction Review Code 1- Verbalizes Understanding       Chronic Lung Disease Review: - Group verbal instruction with posters, models, PowerPoint presentations and videos,  to review new updates, new respiratory medications, new advancements in procedures and treatments. Providing information on  websites and "800" numbers for continued self-education. Includes information about supplement oxygen, available portable oxygen systems, continuous and intermittent flow rates, oxygen safety, concentrators, and Medicare reimbursement for oxygen. Explanation of Pulmonary Drugs, including class, frequency, complications, importance of spacers, rinsing mouth after steroid MDI's, and proper cleaning methods for nebulizers. Review of basic lung anatomy and physiology related to function, structure, and complications of lung disease. Review of risk factors. Discussion about methods for diagnosing sleep apnea and types of masks and machines for OSA. Includes a review of the use of types of environmental controls: home humidity, furnaces, filters, dust mite/pet prevention, HEPA vacuums. Discussion about weather changes, air quality and the benefits of nasal washing. Instruction on Warning signs, infection symptoms, calling MD promptly, preventive modes, and value of vaccinations. Review of effective airway clearance, coughing and/or vibration techniques. Emphasizing that all should Create an Action Plan. Written material given at graduation. Flowsheet Row Pulmonary Rehab from 11/03/2021 in [facility] Cardiac and Pulmonary Rehab  Date 09/22/21  Educator [facility]  Instruction Review Code 1- Verbalizes Understanding       AED/CPR: - Group verbal and written instruction with the use of models to demonstrate the basic use of the AED with the basic ABC's of resuscitation. Flowsheet Row Pulmonary Rehab from 02/26/2019 in [facility] Cardiac and Pulmonary Rehab  Date 01/01/19  Educator [facility]  Instruction Review Code 1- Verbalizes Understanding        Anatomy and Cardiac Procedures: - Group verbal and visual presentation and models provide information about basic cardiac anatomy and function. Reviews the testing methods done to diagnose heart disease and the outcomes of the test results. Describes the treatment choices: Medical  Management, Angioplasty, or Coronary Bypass Surgery for treating various heart conditions including Myocardial Infarction, Angina, Valve Disease, and Cardiac Arrhythmias.  Written material given at graduation.   Medication Safety: - Group verbal and visual instruction to review commonly prescribed medications for heart and lung disease. Reviews the medication, class of the drug, and side effects. Includes the steps to properly store meds and maintain the prescription regimen.  Written material given at graduation. Flowsheet Row Pulmonary Rehab from  11/03/2021 in [facility] Cardiac and Pulmonary Rehab  Date 09/01/21  Educator [facility]  Instruction Review Code 1- Verbalizes Understanding       Other: -Provides group and verbal instruction on various topics (see comments)   Knowledge Questionnaire Score:  Knowledge Questionnaire Score - 11/17/21 1140       Knowledge Questionnaire Score   Pre Score 18/18    Post Score 18/18              Core Components/Risk Factors/Patient Goals at Admission:  Personal Goals and Risk Factors at Admission - 06/13/21 1235       Core Components/Risk Factors/Patient Goals on Admission    Weight Management Yes;Weight Loss    Intervention Weight Management: Develop a combined nutrition and exercise program designed to reach desired caloric intake, while maintaining appropriate intake of nutrient and fiber, sodium and fats, and appropriate energy expenditure required for the weight goal.;Weight Management: Provide education and appropriate resources to help participant work on and attain dietary goals.;Weight Management/Obesity: Establish reasonable short term and long term weight goals.    Admit Weight 192 lb (87.1 kg)    Goal Weight: Short Term 187 lb (84.8 kg)    Goal Weight: Long Term 182 lb (82.6 kg)    Expected Outcomes Short Term: Continue to assess and modify interventions until short term weight is achieved;Long Term: Adherence to nutrition and physical  activity/exercise program aimed toward attainment of established weight goal;Weight Loss: Understanding of general recommendations for a balanced deficit meal plan, which promotes 1-2 lb weight loss per week and includes a negative energy balance of [phone] kcal/d;Understanding recommendations for meals to include 15-35% energy as protein, 25-35% energy from fat, 35-60% energy from carbohydrates, less than 260m of dietary cholesterol, 20-35 gm of total fiber daily;Understanding of distribution of calorie intake throughout the day with the consumption of 4-5 meals/snacks    Improve shortness of breath with ADL's Yes    Intervention Provide education, individualized exercise plan and daily activity instruction to help decrease symptoms of SOB with activities of daily living.    Expected Outcomes Short Term: Improve cardiorespiratory fitness to achieve a reduction of symptoms when performing ADLs;Long Term: Be able to perform more ADLs without symptoms or delay the onset of symptoms    Increase knowledge of respiratory medications and ability to use respiratory devices properly  Yes    Intervention Provide education and demonstration as needed of appropriate use of medications, inhalers, and oxygen therapy.    Expected Outcomes Short Term: Achieves understanding of medications use. Understands that oxygen is a medication prescribed by physician. Demonstrates appropriate use of inhaler and oxygen therapy.;Long Term: Maintain appropriate use of medications, inhalers, and oxygen therapy.    Diabetes Yes    Intervention Provide education about signs/symptoms and action to take for hypo/hyperglycemia.;Provide education about proper nutrition, including hydration, and aerobic/resistive exercise prescription along with prescribed medications to achieve blood glucose in normal ranges: Fasting glucose 65-99 mg/dL    Expected Outcomes Short Term: Participant verbalizes understanding of the signs/symptoms and immediate  care of hyper/hypoglycemia, proper foot care and importance of medication, aerobic/resistive exercise and nutrition plan for blood glucose control.;Long Term: Attainment of HbA1C < 7%.    Hypertension Yes    Intervention Provide education on lifestyle modifcations including regular physical activity/exercise, weight management, moderate sodium restriction and increased consumption of fresh fruit, vegetables, and low fat dairy, alcohol moderation, and smoking cessation.;Monitor prescription use compliance.    Expected Outcomes Short Term: Continued assessment and intervention  until BP is < 140/26m HG in hypertensive participants. < 130/874mHG in hypertensive participants with diabetes, heart failure or chronic kidney disease.;Long Term: Maintenance of blood pressure at goal levels.    Lipids Yes    Intervention Provide education and support for participant on nutrition & aerobic/resistive exercise along with prescribed medications to achieve LDL <702mHDL >56m35m  Expected Outcomes Short Term: Participant states understanding of desired cholesterol values and is compliant with medications prescribed. Participant is following exercise prescription and nutrition guidelines.;Long Term: Cholesterol controlled with medications as prescribed, with individualized exercise RX and with personalized nutrition plan. Value goals: LDL < 70mg37mL > 40 mg.             Education:Diabetes - Individual verbal and written instruction to review signs/symptoms of diabetes, desired ranges of glucose level fasting, after meals and with exercise. Acknowledge that pre and post exercise glucose checks will be done for 3 sessions at entry of program. Flow[facility]  Date 06/01/21  [facility]- Verbal[facility]       Know Your Numbers and Heart Failure: - Group verbal and visual instruction to discuss disease risk  factors for cardiac and pulmonary disease and treatment options.  Reviews associated critical values for Overweight/Obesity, Hypertension, Cholesterol, and Diabetes.  Discusses basics of heart failure: signs/symptoms and treatments.  Introduces Heart Failure Zone chart for action plan for heart failure.  Written material given at graduation. Flowsheet Row [facility]monary [facility]  Date 07/14/21  Educator [facility]  Instruction Review Code 1- Verbalizes Understanding       Core Components/Risk Factors/Patient Goals Review:   Goals and Risk Factor Review     Row Name 07/07/21 1129 08/09/21 1115 09/08/21 1117 10/04/21 1130 11/03/21 1113     Core Components/Risk Factors/Patient Goals Review   Personal Goals Review Weight Management/Obesity;Improve shortness of breath with ADL's;Hypertension;Diabetes;Increase knowledge of respiratory medications and ability to use respiratory devices properly. Weight Management/Obesity;Improve shortness of breath with ADL's;Hypertension;Diabetes;Increase knowledge of respiratory medications and ability to use respiratory devices properly. Weight Management/Obesity;Improve shortness of breath with ADL's;Hypertension;Diabetes;Increase knowledge of respiratory medications and ability to use respiratory devices properly. Weight Management/Obesity;Improve shortness of breath with ADL's;Hypertension;Diabetes;Increase knowledge of respiratory medications and ability to use respiratory devices properly. --   Review Mike Darius Beltsoing well in rehab.  He is still try to lose weight.  He admits to the salt being a probelm for him.  His sugars have been doing well overall.  He is trying to keep it under 130 and usually averages between 100-115 mg/dl when he checks it on Sunday mornings.  He is doing well with all his meds and using oxygen and inhalers like he is supposed to.  His pressures have been doing well.  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 Mike Darius Beltss to stay active at home on his off days, but his breathing continues to limit him in how much he is able to do. He is doing well with taking his meds regularly. His weight is staying under 200  lb.  Today, he is 193 lb and seems to hold steady.  His pressures have been doing well and sugars seem to be good.  He is still waiting to here back from his PCP  as they ended up doing more blood work. Darius Norris is doing well in rehab.  His weight is holding steady. He is at 194 today.  His blood pressures and sugars are doing well. He never did hear back from blood work, so he assumes all is well.  He is doing well with his meds and continues to work on his breathing. He is doing well wihth his breathing and feels good overall, but he is having difficulty with sinus drainae at night with limits his O2 until he can cough it up. He has not told his doctor yet, but he plans to make an appointment becuase he is having problems with his hip as well. He is going for another COVID booster and needs his flu shot. His weight continues to maintain at around 190lbs, today 192.8lbs. He checks his BP from time to time, but had a dizzy spell last weekend which scared him how suddenly it came on - he did not check his BP or BG. His BG last Sunday was 99 - he does not take it regularly. He continues taking his medications as directed with no issues. Darius Norris will complete LW in 3 more sessions.  He has enjoyed the exercise.  He plans to use TM at home when he completes LW.  He does feel his breathing has improved since he has been exercising.  He did speak with pharmacist about dizzy spells as it may be a medication.  He does check BP and BG at home.  He keeps a log of both   Expected Outcomes Short: Continue to check numbers routinely LOng; Continue to cut back on salt Short: Hear back from doctor Long: COntinue to monitor risk factors.  Short: Continue to work on breathing and getting active again. Long: Continiue to montior risk factors. Short: take BP and BG regularly, make MD appointment for dizzy spells and sinus drainage. Long: Continiue to montior risk factors. Short: complete LW Long: maintain exercise on his own            Core Components/Risk Factors/Patient Goals at Discharge (Final Review):   Goals and Risk Factor Review - 11/03/21 1113       Core Components/Risk Factors/Patient Goals Review   Review Darius Norris will complete LW in 3 more sessions.  He has enjoyed the exercise.  He plans to use TM at home when he completes LW.  He does feel his breathing has improved since he has been exercising.  He did speak with pharmacist about dizzy spells as it may be a medication.  He does check BP and BG at home.  He keeps a log of both    Expected Outcomes Short: complete LW Long: maintain exercise on his own             ITP Comments:  ITP Comments     Row Name 06/01/21 1552 06/13/21 1152 06/23/21 1131 06/29/21 1136 07/27/21 0809   ITP Comments Virtual Visit completed. Patient informed on EP and RD appointment and 6 Minute walk test. Patient also informed of patient health questionnaires on My Chart. Patient Verbalizes understanding. Visit diagnosis can be found in [facility] [employer]. Patient is [facility]. Completed 6MWT and gym orientation. Initial ITP created and sent for review to Dr.[facility], Medical Director. First full day of exercise!  Patient was oriented to gym and equipment including functions, settings, policies, and procedures.  Patient's individual exercise prescription and treatment plan were reviewed.  All starting workloads were established based on the results  of the 6 minute walk test done at initial orientation visit.  The plan for exercise progression was also introduced and progression will be customized based on patient's performance and goals. 30 Day review completed. Medical Director ITP review done,  changes made as directed, and signed approval by Medical Director.    New to program 30 Day review completed. Medical Director ITP review done, changes made as directed, and signed approval by Medical Director.    [place] Name 08/24/21 0737 09/21/21 1353 10/19/21 0759 11/16/21 0739 11/17/21 1102   ITP Comments 30 Day review completed. Medical Director ITP review done, changes made as directed, and signed approval by Medical Director. 30 day review completed. ITP sent to Dr. Zetta Bills, Medical Director of  Pulmonary Rehab. Continue with ITP unless changes are made by physician. 30 Day review completed. Medical Director ITP review done, changes made as directed, and signed approval by Medical Director. 30 Day review completed. Medical Director ITP review done, changes made as directed, and signed approval by Medical Director. Afnan graduated today from  rehab with 36 sessions completed.  Details of the patient's exercise prescription and what He needs to do in order to continue the prescription and progress were discussed with patient.  Patient was given a copy of prescription and goals.  Patient verbalized understanding.  Isais plans to continue to exercise by walking, using the treadmill, and using staff videos.            Comments: discharge ITP

## 2021-11-22 ENCOUNTER — Ambulatory Visit: Payer: No Typology Code available for payment source

## 2021-11-24 ENCOUNTER — Ambulatory Visit: Payer: No Typology Code available for payment source

## 2021-11-29 ENCOUNTER — Ambulatory Visit: Payer: No Typology Code available for payment source

## 2021-12-05 ENCOUNTER — Encounter: Payer: Self-pay | Admitting: Internal Medicine

## 2021-12-05 ENCOUNTER — Observation Stay: Payer: No Typology Code available for payment source

## 2021-12-05 ENCOUNTER — Emergency Department: Payer: No Typology Code available for payment source

## 2021-12-05 ENCOUNTER — Other Ambulatory Visit: Payer: Self-pay

## 2021-12-05 ENCOUNTER — Observation Stay
Admission: EM | Admit: 2021-12-05 | Discharge: 2021-12-06 | Disposition: A | Discharge status: Home / Self Care | Payer: No Typology Code available for payment source | Attending: Internal Medicine | Admitting: Internal Medicine

## 2021-12-05 DIAGNOSIS — R079 Chest pain, unspecified: Secondary | ICD-10-CM

## 2021-12-05 DIAGNOSIS — Z7984 Long term (current) use of oral hypoglycemic drugs: Secondary | ICD-10-CM | POA: Insufficient documentation

## 2021-12-05 DIAGNOSIS — I10 Essential (primary) hypertension: Secondary | ICD-10-CM | POA: Diagnosis present

## 2021-12-05 DIAGNOSIS — R0602 Shortness of breath: Secondary | ICD-10-CM | POA: Insufficient documentation

## 2021-12-05 DIAGNOSIS — F418 Other specified anxiety disorders: Secondary | ICD-10-CM | POA: Diagnosis present

## 2021-12-05 DIAGNOSIS — R739 Hyperglycemia, unspecified: Secondary | ICD-10-CM

## 2021-12-05 DIAGNOSIS — I2510 Atherosclerotic heart disease of native coronary artery without angina pectoris: Secondary | ICD-10-CM | POA: Diagnosis not present

## 2021-12-05 DIAGNOSIS — L409 Psoriasis, unspecified: Secondary | ICD-10-CM | POA: Diagnosis present

## 2021-12-05 DIAGNOSIS — E876 Hypokalemia: Secondary | ICD-10-CM | POA: Diagnosis present

## 2021-12-05 DIAGNOSIS — I4891 Unspecified atrial fibrillation: Principal | ICD-10-CM | POA: Diagnosis present

## 2021-12-05 DIAGNOSIS — Z87891 Personal history of nicotine dependence: Secondary | ICD-10-CM | POA: Insufficient documentation

## 2021-12-05 DIAGNOSIS — Z20822 Contact with and (suspected) exposure to covid-19: Secondary | ICD-10-CM | POA: Insufficient documentation

## 2021-12-05 DIAGNOSIS — E785 Hyperlipidemia, unspecified: Secondary | ICD-10-CM | POA: Diagnosis present

## 2021-12-05 DIAGNOSIS — Z7982 Long term (current) use of aspirin: Secondary | ICD-10-CM | POA: Diagnosis not present

## 2021-12-05 DIAGNOSIS — J449 Chronic obstructive pulmonary disease, unspecified: Secondary | ICD-10-CM | POA: Diagnosis present

## 2021-12-05 DIAGNOSIS — E119 Type 2 diabetes mellitus without complications: Secondary | ICD-10-CM | POA: Insufficient documentation

## 2021-12-05 DIAGNOSIS — Z79899 Other long term (current) drug therapy: Secondary | ICD-10-CM | POA: Diagnosis not present

## 2021-12-05 DIAGNOSIS — R109 Unspecified abdominal pain: Secondary | ICD-10-CM

## 2021-12-05 LAB — CBC WITH DIFFERENTIAL/PLATELET
Abs Immature Granulocytes: 0.06 10*3/uL (ref 0.00–0.07)
Basophils Absolute: 0 10*3/uL (ref 0.0–0.1)
Basophils Relative: 0 %
Eosinophils Absolute: 0.1 10*3/uL (ref 0.0–0.5)
Eosinophils Relative: 1 %
HCT: 42.7 % (ref 39.0–52.0)
Hemoglobin: 13.6 g/dL (ref 13.0–17.0)
Immature Granulocytes: 1 %
Lymphocytes Relative: 18 %
Lymphs Abs: 1.9 10*3/uL (ref 0.7–4.0)
MCH: 29.1 pg (ref 26.0–34.0)
MCHC: 31.9 g/dL (ref 30.0–36.0)
MCV: 91.2 fL (ref 80.0–100.0)
Monocytes Absolute: 0.7 10*3/uL (ref 0.1–1.0)
Monocytes Relative: 7 %
Neutro Abs: 7.3 10*3/uL (ref 1.7–7.7)
Neutrophils Relative %: 73 %
Platelets: 238 10*3/uL (ref 150–400)
RBC: 4.68 MIL/uL (ref 4.22–5.81)
RDW: 17.8 % — ABNORMAL HIGH (ref 11.5–15.5)
WBC: 10.1 10*3/uL (ref 4.0–10.5)
nRBC: 0 % (ref 0.0–0.2)

## 2021-12-05 LAB — COMPREHENSIVE METABOLIC PANEL
ALT: 20 U/L (ref 0–44)
AST: 20 U/L (ref 15–41)
Albumin: 4.3 g/dL (ref 3.5–5.0)
Alkaline Phosphatase: 103 U/L (ref 38–126)
Anion gap: 4 — ABNORMAL LOW (ref 5–15)
BUN: 10 mg/dL (ref 8–23)
CO2: 33 mmol/L — ABNORMAL HIGH (ref 22–32)
Calcium: 8.7 mg/dL — ABNORMAL LOW (ref 8.9–10.3)
Chloride: 103 mmol/L (ref 98–111)
Creatinine, Ser: 0.85 mg/dL (ref 0.61–1.24)
GFR, Estimated: >60 mL/min (ref >60)
Glucose, Bld: 140 mg/dL — ABNORMAL HIGH (ref 70–99)
Potassium: 3.4 mmol/L — ABNORMAL LOW (ref 3.5–5.1)
Sodium: 140 mmol/L (ref 135–145)
Total Bilirubin: 0.9 mg/dL (ref 0.3–1.2)
Total Protein: 7.1 g/dL (ref 6.5–8.1)

## 2021-12-05 LAB — BASIC METABOLIC PANEL
Anion gap: 6 (ref 5–15)
BUN: 14 mg/dL (ref 8–23)
CO2: 29 mmol/L (ref 22–32)
Calcium: 8.9 mg/dL (ref 8.9–10.3)
Chloride: 102 mmol/L (ref 98–111)
Creatinine, Ser: 0.91 mg/dL (ref 0.61–1.24)
GFR, Estimated: >60 mL/min (ref >60)
Glucose, Bld: 121 mg/dL — ABNORMAL HIGH (ref 70–99)
Potassium: 4 mmol/L (ref 3.5–5.1)
Sodium: 137 mmol/L (ref 135–145)

## 2021-12-05 LAB — URINE DRUG SCREEN, QUALITATIVE (ARMC ONLY)
Amphetamines, Ur Screen: NOT DETECTED
Barbiturates, Ur Screen: NOT DETECTED
Benzodiazepine, Ur Scrn: NOT DETECTED
Cannabinoid 50 Ng, Ur ~~LOC~~: NOT DETECTED
Cocaine Metabolite,Ur ~~LOC~~: NOT DETECTED
MDMA (Ecstasy)Ur Screen: NOT DETECTED
Methadone Scn, Ur: NOT DETECTED
Opiate, Ur Screen: NOT DETECTED
Phencyclidine (PCP) Ur S: NOT DETECTED
Tricyclic, Ur Screen: NOT DETECTED

## 2021-12-05 LAB — CBG MONITORING, ED
Glucose-Capillary: 151 mg/dL — ABNORMAL HIGH (ref 70–99)
Glucose-Capillary: 129 mg/dL — ABNORMAL HIGH (ref 70–99)

## 2021-12-05 LAB — MAGNESIUM: Magnesium: 1.8 mg/dL (ref 1.7–2.4)

## 2021-12-05 LAB — RESP PANEL BY RT-PCR (FLU A&B, COVID) ARPGX2
Influenza A by PCR: NEGATIVE
Influenza B by PCR: NEGATIVE
SARS Coronavirus 2 by RT PCR: NEGATIVE

## 2021-12-05 LAB — TROPONIN I (HIGH SENSITIVITY)
Troponin I (High Sensitivity): 14 ng/L (ref <18)
Troponin I (High Sensitivity): 20 ng/L — ABNORMAL HIGH (ref <18)

## 2021-12-05 LAB — BRAIN NATRIURETIC PEPTIDE: B Natriuretic Peptide: 228 pg/mL — ABNORMAL HIGH (ref 0.0–100.0)

## 2021-12-05 MED ORDER — DILTIAZEM HCL ER COATED BEADS 180 MG PO CP24
180.0000 mg | ORAL_CAPSULE | Freq: Every day | ORAL | Status: DC
  Administered 2021-12-05: 15:00:00 180 mg via ORAL
  Filled 2021-12-05 (×2): qty 1

## 2021-12-05 MED ORDER — INSULIN ASPART 100 UNIT/ML IJ SOLN: 0.0000 [IU] | Freq: Three times a day (TID) | INTRAMUSCULAR | Status: DC

## 2021-12-05 MED ORDER — MELATONIN 5 MG PO TABS
5.0000 mg | ORAL_TABLET | Freq: Every day | ORAL | Status: DC
  Administered 2021-12-05: 22:00:00 5 mg via ORAL
  Filled 2021-12-05: qty 1

## 2021-12-05 MED ORDER — MAGNESIUM SULFATE IN D5W 1-5 GM/100ML-% IV SOLN
1.0000 g | Freq: Once | INTRAVENOUS | Status: AC
  Administered 2021-12-05: 15:00:00 1 g via INTRAVENOUS
  Filled 2021-12-05: qty 100

## 2021-12-05 MED ORDER — IPRATROPIUM-ALBUTEROL 0.5-2.5 (3) MG/3ML IN SOLN
3.0000 mL | Freq: Four times a day (QID) | RESPIRATORY_TRACT | Status: DC
Start: 2021-12-05 — End: 2021-12-06
  Administered 2021-12-05 – 2021-12-06 (×3): 3 mL via RESPIRATORY_TRACT
  Filled 2021-12-05 (×3): qty 3

## 2021-12-05 MED ORDER — HYDRALAZINE HCL 20 MG/ML IJ SOLN: 5.0000 mg | INTRAMUSCULAR | Status: DC | PRN

## 2021-12-05 MED ORDER — HYDROXYZINE HCL 25 MG PO TABS: 25.0000 mg | ORAL_TABLET | Freq: Three times a day (TID) | ORAL | Status: DC | PRN

## 2021-12-05 MED ORDER — DABIGATRAN ETEXILATE MESYLATE 150 MG PO CAPS
150.0000 mg | ORAL_CAPSULE | Freq: Two times a day (BID) | ORAL | Status: DC
  Administered 2021-12-05 (×2): 150 mg via ORAL
  Filled 2021-12-05 (×4): qty 1

## 2021-12-05 MED ORDER — ALBUTEROL SULFATE HFA 108 (90 BASE) MCG/ACT IN AERS
2.0000 | INHALATION_SPRAY | RESPIRATORY_TRACT | Status: DC | PRN
  Filled 2021-12-05: qty 6.7

## 2021-12-05 MED ORDER — DM-GUAIFENESIN ER 30-600 MG PO TB12: 1.0000 | ORAL_TABLET | Freq: Two times a day (BID) | ORAL | Status: DC | PRN

## 2021-12-05 MED ORDER — DILTIAZEM HCL 25 MG/5ML IV SOLN
10.0000 mg | Freq: Once | INTRAVENOUS | Status: AC
  Administered 2021-12-05: 13:00:00 10 mg via INTRAVENOUS
  Filled 2021-12-05: qty 5

## 2021-12-05 MED ORDER — POTASSIUM CHLORIDE CRYS ER 20 MEQ PO TBCR
40.0000 meq | EXTENDED_RELEASE_TABLET | ORAL | Status: AC
  Administered 2021-12-05 (×2): 40 meq via ORAL
  Filled 2021-12-05 (×2): qty 2

## 2021-12-05 MED ORDER — ASPIRIN EC 81 MG PO TBEC
81.0000 mg | DELAYED_RELEASE_TABLET | Freq: Every day | ORAL | Status: DC
  Administered 2021-12-05: 15:00:00 81 mg via ORAL
  Filled 2021-12-05: qty 1

## 2021-12-05 MED ORDER — ATORVASTATIN CALCIUM 20 MG PO TABS
40.0000 mg | ORAL_TABLET | Freq: Every day | ORAL | Status: DC
  Administered 2021-12-05: 22:00:00 40 mg via ORAL
  Filled 2021-12-05: qty 2

## 2021-12-05 MED ORDER — INSULIN ASPART 100 UNIT/ML IJ SOLN: 0.0000 [IU] | Freq: Every day | INTRAMUSCULAR | Status: DC

## 2021-12-05 MED ORDER — ONDANSETRON HCL 4 MG/2ML IJ SOLN: 4.0000 mg | Freq: Three times a day (TID) | INTRAMUSCULAR | Status: DC | PRN

## 2021-12-05 MED ORDER — MAGNESIUM SULFATE 2 GM/50ML IV SOLN
2.0000 g | Freq: Once | INTRAVENOUS | Status: DC
Start: 2021-12-05 — End: 2021-12-05

## 2021-12-05 MED ORDER — ACETAMINOPHEN 160 MG/5ML PO SOLN
650.0000 mg | Freq: Four times a day (QID) | ORAL | Status: DC | PRN
  Administered 2021-12-06: 02:00:00 650 mg via ORAL
  Filled 2021-12-05 (×3): qty 20.3

## 2021-12-05 MED ORDER — MOMETASONE FURO-FORMOTEROL FUM 200-5 MCG/ACT IN AERO
2.0000 | INHALATION_SPRAY | Freq: Two times a day (BID) | RESPIRATORY_TRACT | Status: DC
  Administered 2021-12-05 – 2021-12-06 (×2): 2 via RESPIRATORY_TRACT
  Filled 2021-12-05: qty 8.8

## 2021-12-05 MED ORDER — PANTOPRAZOLE SODIUM 40 MG PO TBEC
40.0000 mg | DELAYED_RELEASE_TABLET | Freq: Every day | ORAL | Status: DC
  Administered 2021-12-05: 15:00:00 40 mg via ORAL
  Filled 2021-12-05: qty 1

## 2021-12-05 MED ORDER — FENTANYL CITRATE PF 50 MCG/ML IJ SOSY
12.5000 ug | PREFILLED_SYRINGE | INTRAMUSCULAR | Status: DC | PRN
  Administered 2021-12-05 – 2021-12-06 (×2): 12.5 ug via INTRAVENOUS
  Filled 2021-12-05 (×2): qty 1

## 2021-12-05 MED ORDER — NITROGLYCERIN 0.4 MG SL SUBL: 0.4000 mg | SUBLINGUAL_TABLET | SUBLINGUAL | Status: DC | PRN

## 2021-12-05 MED ORDER — MOMETASONE FUROATE 220 MCG/INH IN AEPB: 2.0000 | INHALATION_SPRAY | Freq: Every day | RESPIRATORY_TRACT | Status: DC

## 2021-12-05 MED ORDER — DILTIAZEM HCL-DEXTROSE 125-5 MG/125ML-% IV SOLN (PREMIX)
5.0000 mg/h | INTRAVENOUS | Status: DC
  Administered 2021-12-05: 13:00:00 7.5 mg/h via INTRAVENOUS
  Administered 2021-12-05: 13:00:00 5 mg/h via INTRAVENOUS
  Filled 2021-12-05: qty 125

## 2021-12-05 MED ORDER — BUSPIRONE HCL 5 MG PO TABS
10.0000 mg | ORAL_TABLET | Freq: Two times a day (BID) | ORAL | Status: DC
  Administered 2021-12-05 (×2): 10 mg via ORAL
  Filled 2021-12-05 (×2): qty 2

## 2021-12-05 MED ORDER — LORATADINE 10 MG PO TABS: 10.0000 mg | ORAL_TABLET | Freq: Every day | ORAL | Status: DC | PRN

## 2021-12-05 MED ORDER — FOLIC ACID 1 MG PO TABS
1.0000 mg | ORAL_TABLET | Freq: Every day | ORAL | Status: DC
  Administered 2021-12-05: 15:00:00 1 mg via ORAL
  Filled 2021-12-05: qty 1

## 2021-12-05 MED ORDER — LOSARTAN POTASSIUM 50 MG PO TABS
25.0000 mg | ORAL_TABLET | Freq: Every day | ORAL | Status: DC
  Administered 2021-12-05: 15:00:00 25 mg via ORAL
  Filled 2021-12-05: qty 1

## 2021-12-05 MED ORDER — METOPROLOL TARTRATE 50 MG PO TABS
50.0000 mg | ORAL_TABLET | Freq: Two times a day (BID) | ORAL | Status: DC
  Administered 2021-12-05 (×2): 50 mg via ORAL
  Filled 2021-12-05 (×2): qty 1

## 2021-12-05 MED ORDER — ISOSORBIDE MONONITRATE ER 60 MG PO TB24
60.0000 mg | ORAL_TABLET | Freq: Every day | ORAL | Status: DC
  Administered 2021-12-05: 15:00:00 60 mg via ORAL
  Filled 2021-12-05: qty 1

## 2021-12-05 MED ORDER — GABAPENTIN 300 MG PO CAPS: 300.0000 mg | ORAL_CAPSULE | Freq: Three times a day (TID) | ORAL | Status: DC | PRN

## 2021-12-05 NOTE — ED Notes (Signed)
Pt c/o abdominal pain, 5/10, MD notified. Also informed MD of stopping cardizem gtt d/t HR consistently in 4s.

## 2021-12-05 NOTE — ED Provider Notes (Signed)
[facility] Emergency Department Provider Note   ____________________________________________   Event Date/Time   First MD Initiated Contact with Patient 12/05/21 1227     (approximate)  I have reviewed the triage vital signs and the nursing notes.   HISTORY  Chief Complaint Chest Pain    HPI Darius Norris is a 70 y.o. male with past medical history of hypertension, hyperlipidemia, diabetes, CAD, COPD on 3 L, and atrial fibrillation on dabigatran who presents to the ED complaining of chest pain.  Patient reports that when he woke up this morning he felt like his heart was racing with tightness in his chest.  He states the sensation of his heart racing has improved since he woke up but he continues to feel very tight in his chest with some chest pain.  He denies any difficulty breathing beyond his usual chronic shortness of breath related to COPD.  He has not been dealing with a fever, cough, nausea, vomiting, diarrhea, abdominal pain, or dysuria.  He states he has been taking his medications as prescribed, but has not yet had his morning dose of rate control medications.  He reports feeling fine when he went to bed last night.        Past Medical History:  Diagnosis Date   A-fib [facility])    CAD (coronary artery disease)    Colitis    COPD (chronic obstructive pulmonary disease) (HCC)    Emphysema lung (HCC)    GERD (gastroesophageal reflux disease)    Hypertension    MI (myocardial infarction) ([place]) 1999    Patient Active Problem List   Diagnosis Date Noted   Atrial fibrillation with RVR ([place]) 12/05/2021   HLD (hyperlipidemia) 12/05/2021   COPD (chronic obstructive pulmonary disease) ([place]) 12/05/2021   Depression with anxiety 12/05/2021   Acute hypoxemic respiratory failure ([place]) 05/28/2018   A-fib ([place]) 05/02/2018   Chest pain 11/17/2017   Unstable angina ([place]) 09/04/2016   Uncontrolled hypertension 09/04/2016   Hypokalemia 09/04/2016    Leukocytosis 09/04/2016   Hyperglycemia 09/04/2016   COPD exacerbation ([place]) 12/15/2015   Elevated troponin 12/15/2015   HTN (hypertension) 12/15/2015   GERD (gastroesophageal reflux disease) 12/15/2015   CAD (coronary artery disease) 12/15/2015   Angina pectoris ([place]) 12/15/2015    Past Surgical History:  Procedure Laterality Date   ABDOMINAL HERNIA REPAIR Bilateral    CHOLECYSTECTOMY     COLONOSCOPY WITH PROPOFOL N/A 02/01/2018   Procedure: COLONOSCOPY WITH PROPOFOL;  Surgeon: Jonathon Bellows, MD;  Location[facility] ENDOSCOPY;  Service: Gastroenterology;  Laterality: N/A;   KNEE ARTHROSCOPY Bilateral     Prior to Admission medications   Medication Sig Start Date End Date Taking? Authorizing Provider  acetaminophen (TYLENOL) 325 MG tablet Take 650 mg by mouth every 6 (six) hours as needed.   Yes [provider]  albuterol (VENTOLIN HFA) 108 (90 Base) MCG/ACT inhaler Inhale into the lungs. 03/30/21  Yes [provider]  aspirin EC 81 MG EC tablet Take 1 tablet (81 mg total) by mouth daily. 06/09/18  Yes [name], Avel Peace, MD  atorvastatin (LIPITOR) 80 MG tablet Take 1 tablet (80 mg total) by mouth at bedtime. Patient taking differently: Take 40 mg by mouth at bedtime. 11/18/17  Yes Vaughan Basta, MD  budesonide-formoterol [facility]) 160-4.5 MCG/ACT inhaler Inhale 2 puffs into the lungs 2 (two) times daily.   Yes [provider]  busPIRone (BUSPAR) 10 MG tablet Take by mouth.   Yes [provider]  clobetasol (TEMOVATE)  0.05 % external solution Apply topically. 02/28/21  Yes [provider]  dabigatran (PRADAXA) 150 MG CAPS capsule Take 150 mg by mouth 2 (two) times daily.   Yes [provider]  diltiazem (CARDIZEM CD) 180 MG 24 hr capsule Take 1 capsule (180 mg total) by mouth daily. 06/09/18  Yes [name], Avel Peace, MD  folic acid (FOLVITE) 1 MG tablet Take by mouth. 03/30/21  Yes [provider]  gabapentin (NEURONTIN) 300 MG  capsule Take 1 capsule by mouth 3 (three) times daily as needed. 10/10/21  Yes [provider]  guaifenesin (HUMIBID E) 400 MG TABS tablet Take by mouth. 03/30/21  Yes [provider]  hydrocortisone 2.5 % cream Apply topically. 02/28/21  Yes [provider]  hydrOXYzine (ATARAX/VISTARIL) 25 MG tablet Take by mouth.   Yes [provider]  Ipratropium-Albuterol (COMBIVENT) 20-100 MCG/ACT AERS respimat Inhale 1 puff into the lungs every 6 (six) hours as needed for wheezing or shortness of breath.   Yes [provider]  isosorbide mononitrate (IMDUR) 60 MG 24 hr tablet Take 60 mg by mouth daily.   Yes [provider]  lidocaine (XYLOCAINE) 5 % ointment Apply topically. 03/30/21  Yes [provider]  loratadine (CLARITIN) 10 MG tablet Take 10 mg by mouth daily as needed for allergies.    Yes [provider]  losartan (COZAAR) 25 MG tablet Take 1 tablet by mouth daily. 11/25/21  Yes [provider]  Melatonin 3 MG CAPS Take 2 capsules by mouth at bedtime. 10/10/21  Yes [provider]  metFORMIN (GLUCOPHAGE-XR) 500 MG 24 hr tablet Take 500 mg by mouth in the morning and at bedtime. 03/30/21  Yes [provider]  methotrexate (RHEUMATREX) 2.5 MG tablet Take by mouth. 03/02/21  Yes [provider]  metoprolol tartrate (LOPRESSOR) 50 MG tablet Take by mouth. 02/14/19  Yes [provider]  mometasone [facility]) 220 MCG/INH inhaler Take by mouth. 03/10/21  Yes [provider]  omeprazole (PRILOSEC) 20 MG capsule Take 20 mg by mouth daily.    Yes [provider]  senna (SENOKOT) 8.6 MG TABS tablet Take 1 tablet by mouth daily as needed for mild constipation.   Yes [provider]  senna-docusate (SENOKOT-S) 8.6-50 MG tablet Take 1 tablet by mouth daily.   Yes [provider]  simethicone (MYLICON) 80 MG chewable tablet Chew by mouth. 03/30/21  Yes [provider]  urea (CARMOL) 20 % cream Apply topically. 11/29/20  Yes [provider]  amitriptyline (ELAVIL) 10 MG tablet Take 15 mg by mouth at bedtime.  Patient not taking: Reported on 12/05/2021    [provider]  fluticasone (FLONASE) 50 MCG/ACT nasal spray Place 2 sprays into both nostrils daily. Patient not taking: Reported on 12/05/2021    [provider]  gabapentin (NEURONTIN) 100 MG capsule Take 100 mg by mouth at bedtime. Patient not taking: Reported on 06/01/2021    [provider]  gabapentin (NEURONTIN) 300 MG capsule Take by mouth. Patient not taking: Reported on 12/05/2021 03/30/21   [provider]  lisinopril (PRINIVIL,ZESTRIL) 40 MG tablet Take 20 mg by mouth daily.  Patient not taking: Reported on 06/01/2021    [provider]  potassium chloride (K-DUR,KLOR-CON) 10 MEQ tablet Take 10 mEq by mouth daily. Patient not taking: Reported on 06/01/2021    [provider]  traZODone (DESYREL) 50 MG tablet Take 1 tablet (50 mg total) by mouth at bedtime as needed for sleep.  Patient not taking: Reported on 12/05/2021 05/29/18   [name], Holly Bodily D, MD    Allergies Ranitidine hcl, Zantac [ranitidine hcl], Codeine, Erythromycin, Hydrocodone, Morphine, Morphine and related, Tiotropium bromide monohydrate, Hydrocodone-acetaminophen, Vicodin [hydrocodone-acetaminophen], and Zantac [ranitidine]  Family History  Problem Relation Age of Onset   CAD Father    Stroke Father    CAD Sister    Stroke Brother     Social History Social History   Tobacco Use   Smoking status: Former    Packs/day: 1.00    Years: 30.00    Pack years: 30.00    Types: Cigarettes    Quit date: 12/18/2001    Years since quitting: 19.9   Smokeless tobacco: Never  Vaping Use   Vaping Use: Never used  Substance Use Topics   Alcohol use: No   Drug use: No    Review of Systems  Constitutional: No fever/chills Eyes: No visual changes. ENT: No sore  throat. Cardiovascular: Positive for palpitations and chest pain. Respiratory: Denies shortness of breath. Gastrointestinal: No abdominal pain.  No nausea, no vomiting.  No diarrhea.  No constipation. Genitourinary: Negative for dysuria. Musculoskeletal: Negative for back pain. Skin: Negative for rash. Neurological: Negative for headaches, focal weakness or numbness.  ____________________________________________   PHYSICAL EXAM:  VITAL SIGNS: ED Triage Vitals [12/05/21 1213]  Enc Vitals Group     BP      Pulse Rate (!) 122     Resp 18     Temp 98.5 F (36.9 C)     Temp Source Oral     SpO2 96 %     Weight 198 lb (89.8 kg)     Height 5\' 6"  (1.676 m)     Head Circumference      Peak Flow      Pain Score 6     Pain Loc      Pain Edu?      Excl. in [place]?     Constitutional: Alert and oriented. Eyes: Conjunctivae are normal. Head: Atraumatic. Nose: No congestion/rhinnorhea. Mouth/Throat: Mucous membranes are moist. Neck: Normal ROM Cardiovascular: Tachycardic, irregularly irregular rhythm. Grossly normal heart sounds.  2+ radial pulses bilaterally. Respiratory: Normal respiratory effort.  No retractions. Lungs CTAB. Gastrointestinal: Soft and nontender. No distention. Genitourinary: deferred Musculoskeletal: No lower extremity tenderness nor edema. Neurologic:  Normal speech and language. No gross focal neurologic deficits are appreciated. Skin:  Skin is warm, dry and intact. No rash noted. Psychiatric: Mood and affect are normal. Speech and behavior are normal.  ____________________________________________   LABS (all labs ordered are listed, but only abnormal results are displayed)  Labs Reviewed  CBC WITH DIFFERENTIAL/PLATELET - Abnormal; Notable for the following components:      Result Value   RDW 17.8 (*)    All other components within normal limits  COMPREHENSIVE METABOLIC PANEL - Abnormal; Notable for the following components:   Potassium 3.4 (*)    CO2  33 (*)    Glucose, Bld 140 (*)    Calcium 8.7 (*)    Anion gap 4 (*)    All other components within normal limits  BRAIN NATRIURETIC PEPTIDE - Abnormal; Notable for the following components:   B Natriuretic Peptide 228.0 (*)    All other components within normal limits  RESP PANEL BY RT-PCR (FLU A&B, COVID) ARPGX2  MAGNESIUM  URINE DRUG SCREEN, QUALITATIVE ([facility] ONLY)  HEMOGLOBIN A1C  HIV ANTIBODY (ROUTINE TESTING W REFLEX)  BASIC METABOLIC PANEL  TROPONIN I (HIGH SENSITIVITY)  TROPONIN  I (HIGH SENSITIVITY)   ____________________________________________  EKG  ED ECG REPORT I, Blake Divine, the attending physician, personally viewed and interpreted this ECG.   Date: 12/05/2021  EKG Time: 12:11  Rate: 138  Rhythm: atrial fibrillation  Axis: Normal  Intervals:none  ST&T Change: ST depressions inferolaterally   PROCEDURES  Procedure(s) performed (including Critical Care):  .Critical Care Performed by: Blake Divine, MD Authorized by: Blake Divine, MD   Critical care provider statement:    Critical care time (minutes):  45   Critical care time was exclusive of:  Separately billable procedures and treating other patients and teaching time   Critical care was necessary to treat or prevent imminent or life-threatening deterioration of the following conditions:  Cardiac failure   Critical care was time spent personally by me on the following activities:  Development of treatment plan with patient or surrogate, discussions with consultants, evaluation of patient's response to treatment, examination of patient, ordering and review of laboratory studies, ordering and review of radiographic studies, ordering and performing treatments and interventions, pulse oximetry, re-evaluation of patient's condition and review of old charts   I assumed direction of critical care for this patient from another provider in my specialty: no     Care discussed with: admitting provider      ____________________________________________   INITIAL IMPRESSION / ASSESSMENT AND PLAN / ED COURSE      70 year old male with past medical history of hypertension, hyperlipidemia, diabetes, CAD, COPD on 3 L nasal cannula, and atrial fibrillation on dabigatran who presents to the ED with palpitations and chest pain since waking up this morning.  Patient noted to be in atrial fibrillation with RVR, heart rates varying from 130-150 on my assessment.  Blood pressure is stable and he does not appear to have a history of heart failure, we will start on a diltiazem drip for rate control.  EKG also shows diffuse ST depressions, likely rate related.  Troponin and additional labs are pending, no symptoms to suggest infectious process at this time.  Labs are unremarkable, troponin within normal limits.  Patient's heart rate is improving and is now right around 100 with diltiazem drip running.  He states that his chest pain is improving and case was discussed with hospitalist for admission.  We will continue to trend troponin.      ____________________________________________   FINAL CLINICAL IMPRESSION(S) / ED DIAGNOSES  Final diagnoses:  Atrial fibrillation with RVR ([place])  Nonspecific chest pain     ED Discharge Orders     None        Note:  This document was prepared using Dragon voice recognition software and may include unintentional dictation errors.    Blake Divine, MD 12/05/21 1420

## 2021-12-05 NOTE — H&P (Signed)
History and Physical    [name]:[id number] DOB: 11-09-1951 DOA: 12/05/2021  Referring MD/NP/PA:   PCP: [facility] [place]   Patient coming from:  The patient is coming from home.  At baseline, pt is independent for most of ADL.        Chief Complaint: Palpitation and chest pain  HPI: Darius Norris is a 70 y.o. male with medical history significant of hypertension, hyperlipidemia, COPD on 3 L oxygen, GERD, depression with anxiety, CAD, myocardial infarction, atrial fibrillation on Pradaxa, colitis, psoriasis, who presents with palpitation and chest pain.  Patient states that he started feeling heart racing and fluttering at about 10 AM.  He also has chest pain which is located in substernal area, tightness,  nonradiating, 8 out of 10 severity initially, currently chest pain is minimal.  He has chronic cough and shortness of breath due to COPD, which has not changed.  Patient denies nausea, vomiting, diarrhea or abdominal pain.  No symptoms of UTI.  No fever or chills.  ED Course: pt was found to have WBC 10.1, troponin 14, negative COVID-19 PCR, potassium 3.4, renal function okay, BNP 228, temperature normal, blood pressure 157/82, RR 18, oxygen saturation 96-100% on home level 3 L oxygen.  Chest x-ray showed chronic interstitial change and left basilar atelectasis.  Patient is placed on progressive bed for observation.   Review of Systems:   General: no fevers, chills, no body weight gain, fatigue HEENT: no blurry vision, hearing changes or sore throat Respiratory: has dyspnea, coughing, no wheezing CV: has chest pain, no palpitations GI: no nausea, vomiting, abdominal pain, diarrhea, constipation GU: no dysuria, burning on urination, increased urinary frequency, hematuria  Ext: has trace leg edema Neuro: no unilateral weakness, numbness, or tingling, no vision change or hearing loss Skin: has psoriatic rash, no skin tear. MSK: No muscle spasm, no deformity, no  limitation of range of movement in spin Heme: No easy bruising.  Travel history: No recent long distant travel.  Allergy:  Allergies  Allergen Reactions   Ranitidine Hcl Shortness Of Breath   Zantac [Ranitidine Hcl] Shortness Of Breath   Codeine Itching    Other reaction(s): PRURITIS   Erythromycin Hives    Other reaction(s): SWELLING (NON-SPECIFIC), HIVES, SWELLING (NON-SPECIFIC), HIVES   Hydrocodone     Other reaction(s): PRURITIS   Morphine Itching   Morphine And Related Itching   Tiotropium Bromide Monohydrate    Hydrocodone-Acetaminophen Rash   Vicodin [Hydrocodone-Acetaminophen] Rash   Zantac [Ranitidine] Rash    Other reaction(s): Eruption, HIVES, SHORTNESS OF BREATH, Eruption, HIVES, SHORTNESS OF BREATH    Past Medical History:  Diagnosis Date   A-fib (HCC)    CAD (coronary artery disease)    Colitis    COPD (chronic obstructive pulmonary disease) (HCC)    Emphysema lung (HCC)    GERD (gastroesophageal reflux disease)    Hypertension    MI (myocardial infarction) ([place]) 1999    Past Surgical History:  Procedure Laterality Date   ABDOMINAL HERNIA REPAIR Bilateral    CHOLECYSTECTOMY     COLONOSCOPY WITH PROPOFOL N/A 02/01/2018   Procedure: COLONOSCOPY WITH PROPOFOL;  Surgeon: Jonathon Bellows, MD;  Location: [facility] ENDOSCOPY;  Service: Gastroenterology;  Laterality: N/A;   KNEE ARTHROSCOPY Bilateral     Social History:  reports that he quit smoking about 19 years ago. His smoking use included cigarettes. He has a 30.00 pack-year smoking history. He has never used smokeless tobacco. He reports that he does not drink  alcohol and does not use drugs.  Family History:  Family History  Problem Relation Age of Onset   CAD Father    Stroke Father    CAD Sister    Stroke Brother      Prior to Admission medications   Medication Sig Start Date End Date Taking? Authorizing Provider  acetaminophen (TYLENOL) 325 MG tablet Take 650 mg by mouth every 6 (six) hours as needed.   Patient not taking: Reported on 06/01/2021    [provider]  acetaminophen (TYLENOL) 325 MG tablet Take by mouth. 07/19/12   [provider]  albuterol (VENTOLIN HFA) 108 (90 Base) MCG/ACT inhaler Inhale into the lungs. 03/30/21   [provider]  Albuterol Sulfate, sensor, (PROAIR DIGIHALER) 108 (90 Base) MCG/ACT AEPB Inhale into the lungs.    [provider]  amitriptyline (ELAVIL) 10 MG tablet Take 15 mg by mouth at bedtime.     [provider]  amitriptyline (ELAVIL) 10 MG tablet Take 2 tablets by mouth at bedtime. 03/30/21   [provider]  aspirin EC 81 MG EC tablet Take 1 tablet (81 mg total) by mouth daily. 06/09/18   [name], Avel Peace, MD  atorvastatin (LIPITOR) 80 MG tablet Take 1 tablet (80 mg total) by mouth at bedtime. Patient not taking: Reported on 06/01/2021 11/18/17   Vaughan Basta, MD  atorvastatin (LIPITOR) 80 MG tablet Take by mouth. 11/17/19   [provider]  budesonide-formoterol (SYMBICORT) 160-4.5 MCG/ACT inhaler Inhale 2 puffs into the lungs 2 (two) times daily.    [provider]  busPIRone (BUSPAR) 10 MG tablet Take 10 mg by mouth 2 (two) times daily.    [provider]  busPIRone (BUSPAR) 10 MG tablet Take by mouth. Patient not taking: Reported on 06/01/2021    [provider]  clobetasol (TEMOVATE) 0.05 % external solution Apply topically. 02/28/21   [provider]  clobetasol cream (TEMOVATE) 2.67 % Apply 1 application topically daily as needed. For itching on psoriasis areas    [provider]  clobetasol cream (TEMOVATE) 1.24 % Apply 1 application topically daily as needed. For itching on psoriasis areas    [provider]  dabigatran (PRADAXA) 150 MG CAPS capsule Take 150 mg by mouth 2 (two) times daily.    [provider]  dabigatran (PRADAXA) 150 MG CAPS capsule Take by mouth.    [provider]  diltiazem (CARDIZEM CD) 180  MG 24 hr capsule Take 1 capsule (180 mg total) by mouth daily. Patient not taking: Reported on 06/01/2021 06/09/18   [name], Holly Bodily D, MD  diltiazem [facility]) 180 MG 24 hr capsule Take 1 capsule by mouth daily. 09/03/18   [provider]  fluticasone (FLONASE) 50 MCG/ACT nasal spray Place 2 sprays into both nostrils daily.    [provider]  folic acid (FOLVITE) 1 MG tablet Take by mouth. 03/30/21   [provider]  gabapentin (NEURONTIN) 100 MG capsule Take 100 mg by mouth at bedtime. Patient not taking: Reported on 06/01/2021    [provider]  gabapentin (NEURONTIN) 300 MG capsule Take by mouth. 03/30/21   [provider]  guaifenesin (HUMIBID E) 400 MG TABS tablet Take by mouth. 03/30/21   [provider]  hydrocortisone 2.5 % cream Apply topically. 02/28/21   [provider]  hydrOXYzine (ATARAX/VISTARIL) 25 MG tablet Take by mouth.    [provider]  Ipratropium-Albuterol (COMBIVENT) 20-100 MCG/ACT AERS respimat Inhale 1 puff into the lungs every  6 (six) hours as needed for wheezing or shortness of breath. Patient not taking: Reported on 06/01/2021    [provider]  isosorbide mononitrate (IMDUR) 60 MG 24 hr tablet Take 60 mg by mouth daily. Patient not taking: Reported on 06/01/2021    [provider]  isosorbide mononitrate (IMDUR) 60 MG 24 hr tablet Take by mouth.    [provider]  lidocaine (XYLOCAINE) 5 % ointment Apply topically. 03/30/21   [provider]  lisinopril (PRINIVIL,ZESTRIL) 40 MG tablet Take 20 mg by mouth daily.  Patient not taking: Reported on 06/01/2021    [provider]  lisinopril (ZESTRIL) 40 MG tablet Take by mouth.    [provider]  loratadine (CLARITIN) 10 MG tablet Take 10 mg by mouth daily as needed for allergies.     [provider]  loratadine (CLARITIN) 10 MG tablet Take 1 tablet by mouth daily. Patient not taking: Reported  on 06/01/2021 03/30/21   [provider]  Melatonin 3 MG CAPS Take 6 mg by mouth. 03/30/21   [provider]  metFORMIN (GLUCOPHAGE) 500 MG tablet Take by mouth. Patient not taking: Reported on 06/01/2021    [provider]  metFORMIN (GLUCOPHAGE-XR) 500 MG 24 hr tablet Take by mouth. 03/30/21   [provider]  methotrexate (RHEUMATREX) 2.5 MG tablet Take by mouth. 03/02/21   [provider]  metoprolol tartrate (LOPRESSOR) 100 MG tablet Take 1 tablet (100 mg total) by mouth 2 (two) times daily. Patient not taking: Reported on 06/01/2021 05/04/18   Fritzi Mandes, MD  metoprolol tartrate (LOPRESSOR) 50 MG tablet Take by mouth. 02/14/19   [provider]  mometasone [facility]) 220 MCG/INH inhaler Take by mouth. 03/10/21   [provider]  omeprazole (PRILOSEC) 20 MG capsule Take 20 mg by mouth daily.     [provider]  omeprazole (PRILOSEC) 20 MG capsule Take by mouth.    [provider]  potassium chloride (K-DUR,KLOR-CON) 10 MEQ tablet Take 10 mEq by mouth daily. Patient not taking: Reported on 06/01/2021    [provider]  senna (SENOKOT) 8.6 MG TABS tablet Take 1 tablet by mouth daily as needed for mild constipation.    [provider]  senna-docusate (SENOKOT-S) 8.6-50 MG tablet Take 1 tablet by mouth daily. Patient not taking: Reported on 06/01/2021    [provider]  simethicone (MYLICON) 80 MG chewable tablet Chew by mouth. 03/30/21   [provider]  traZODone (DESYREL) 50 MG tablet Take 1 tablet (50 mg total) by mouth at bedtime as needed for sleep. 05/29/18   [name], Avel Peace, MD  urea (CARMOL) 20 % cream Apply topically. 11/29/20   [provider]    Physical Exam: Vitals:   12/05/21 1230 12/05/21 1300 12/05/21 1630 12/05/21 1645  BP: (!) 157/82 132/72    Pulse: 93 96 73 70  Resp: 17 16 16 18   Temp:      TempSrc:      SpO2: 99% 100% 98% 97%  Weight:      Height:        General: Not in acute distress HEENT:       Eyes: PERRL, EOMI, no scleral icterus.       ENT: No discharge from the ears and nose, no pharynx injection, no tonsillar enlargement.        Neck: No JVD, no bruit, no mass felt. Heme: No neck lymph node enlargement. Cardiac: S1/S2, irregularly irregular rhythm, no murmurs, No  gallops or rubs. Respiratory: No rales, wheezing, rhonchi or rubs. GI: Soft, nondistended, nontender, no rebound pain, no organomegaly, BS present. GU: No hematuria Ext: Has trace leg edema bilaterally. 1+DP/PT pulse bilaterally. Musculoskeletal: No joint deformities, No joint redness or warmth, no limitation of ROM in spin. Skin: has psoriasis rash in all extremities Neuro: Alert, oriented X3, cranial nerves II-XII grossly intact, moves all extremities normally. Psych: Patient is not psychotic, no suicidal or hemocidal ideation.  Labs on Admission: I have personally reviewed following labs and imaging studies  CBC: Recent Labs  Lab 12/05/21 1223  WBC 10.1  NEUTROABS 7.3  HGB 13.6  HCT 42.7  MCV 91.2  PLT 536   Basic Metabolic Panel: Recent Labs  Lab 12/05/21 1223  NA 140  K 3.4*  CL 103  CO2 33*  GLUCOSE 140*  BUN 10  CREATININE 0.85  CALCIUM 8.7*  MG 1.8   GFR: Estimated Creatinine Clearance: 84.9 mL/min (by C-G formula based on SCr of 0.85 mg/dL). Liver Function Tests: Recent Labs  Lab 12/05/21 1223  AST 20  ALT 20  ALKPHOS 103  BILITOT 0.9  PROT 7.1  ALBUMIN 4.3   No results for input(s): LIPASE, AMYLASE in the last 168 hours. No results for input(s): AMMONIA in the last 168 hours. Coagulation Profile: No results for input(s): INR, PROTIME in the last 168 hours. Cardiac Enzymes: No results for input(s): CKTOTAL, CKMB, CKMBINDEX, TROPONINI in the last 168 hours. BNP (last 3 results) No results for input(s): PROBNP in the last 8760 hours. HbA1C: No results for input(s): HGBA1C in the last 72 hours. CBG: Recent Labs  Lab  12/05/21 1639  GLUCAP 151*   Lipid Profile: No results for input(s): CHOL, HDL, LDLCALC, TRIG, CHOLHDL, LDLDIRECT in the last 72 hours. Thyroid Function Tests: No results for input(s): TSH, T4TOTAL, FREET4, T3FREE, THYROIDAB in the last 72 hours. Anemia Panel: No results for input(s): VITAMINB12, FOLATE, FERRITIN, TIBC, IRON, RETICCTPCT in the last 72 hours. Urine analysis:    Component Value Date/Time   COLORURINE YELLOW (A) 12/10/2017 2248   APPEARANCEUR CLEAR (A) 12/10/2017 2248   APPEARANCEUR Clear 02/04/2013 1430   LABSPEC 1.009 12/10/2017 2248   LABSPEC 1.004 02/04/2013 1430   PHURINE 6.0 12/10/2017 2248   GLUCOSEU NEGATIVE 12/10/2017 2248   GLUCOSEU Negative 02/04/2013 1430   HGBUR NEGATIVE 12/10/2017 2248   BILIRUBINUR NEGATIVE 12/10/2017 2248   BILIRUBINUR Negative 02/04/2013 1430   KETONESUR NEGATIVE 12/10/2017 2248   PROTEINUR NEGATIVE 12/10/2017 2248   NITRITE NEGATIVE 12/10/2017 2248   LEUKOCYTESUR NEGATIVE 12/10/2017 2248   LEUKOCYTESUR Negative 02/04/2013 1430   Sepsis Labs: @LABRCNTIP (procalcitonin:4,lacticidven:4) ) Recent Results (from the past 240 hour(s))  Resp Panel by RT-PCR (Flu A&B, Covid) Nasopharyngeal Swab     Status: None   Collection Time: 12/05/21 12:23 PM   Specimen: Nasopharyngeal Swab; Nasopharyngeal(NP) swabs in vial transport medium  Result Value Ref Range Status   SARS Coronavirus 2 by RT PCR NEGATIVE NEGATIVE Final    Comment: (NOTE) SARS-CoV-2 target nucleic acids are NOT DETECTED.  The SARS-CoV-2 RNA is generally detectable in upper respiratory specimens during the acute phase of infection. The lowest concentration of SARS-CoV-2 viral copies this assay can detect is 138 copies/mL. A negative result does not preclude SARS-Cov-2 infection and should not be used as the sole basis for treatment or other patient management decisions. A negative result may occur with  improper specimen collection/handling, submission of specimen  other than nasopharyngeal swab, presence of viral mutation(s) within the  areas targeted by this assay, and inadequate number of viral copies(<138 copies/mL). A negative result must be combined with clinical observations, patient history, and epidemiological information. The expected result is Negative.  Fact Sheet for Patients:  EntrepreneurPulse.com.au  Fact Sheet for Healthcare Providers:  IncredibleEmployment.be  This test is no t yet approved or cleared by the [place] FDA and  has been authorized for detection and/or diagnosis of SARS-CoV-2 by FDA under an Emergency Use Authorization (EUA). This EUA will remain  in effect (meaning this test can be used) for the duration of the COVID-19 declaration under Section 564(b)(1) of the Act, 21 U.S.C.section 360bbb-3(b)(1), unless the authorization is terminated  or revoked sooner.       Influenza A by PCR NEGATIVE NEGATIVE Final   Influenza B by PCR NEGATIVE NEGATIVE Final    Comment: (NOTE) The Xpert Xpress SARS-CoV-2/FLU/RSV plus assay is intended as an aid in the diagnosis of influenza from Nasopharyngeal swab specimens and should not be used as a sole basis for treatment. Nasal washings and aspirates are unacceptable for Xpert Xpress SARS-CoV-2/FLU/RSV testing.  Fact Sheet for Patients: EntrepreneurPulse.com.au  Fact Sheet for Healthcare Providers: IncredibleEmployment.be  This test is not yet approved or cleared by the [place] FDA and has been authorized for detection and/or diagnosis of SARS-CoV-2 by FDA under an Emergency Use Authorization (EUA). This EUA will remain in effect (meaning this test can be used) for the duration of the COVID-19 declaration under Section 564(b)(1) of the Act, 21 U.S.C. section 360bbb-3(b)(1), unless the authorization is terminated or revoked.  Performed at [facility], [address]  [place], [place] [address]      Radiological Exams on Admission: DG Chest Portable 1 View  Result Date: 12/05/2021 CLINICAL DATA:  Atrial fibrillation, shortness of breath and chest pain for 2 days, coronary artery disease post MI, COPD, hypertension EXAM: PORTABLE CHEST 1 VIEW COMPARISON:  Portable exam 1247 hours compared to 01/05/2019 FINDINGS: Upper normal heart size. Mediastinal contours and pulmonary vascularity normal. Atherosclerotic calcification aorta. Chronic accentuation of bibasilar markings with superimposed atelectasis versus consolidation at LEFT lung base. Upper lungs clear with underlying emphysematous changes noted. No pleural effusion or pneumothorax. IMPRESSION: Emphysematous changes with chronic bibasilar interstitial lung disease and mild superimposed LEFT basilar atelectasis versus infiltrate. Aortic Atherosclerosis (ICD10-I70.0) and Emphysema (ICD10-J43.9). Electronically Signed   By: Lavonia Dana M.D.   On: 12/05/2021 12:58     EKG: I have personally reviewed.  Atrial fibrillation, heart rate 138, QTc 463, ST depression in precordial leads and inferior leads.  Assessment/Plan Principal Problem:   Atrial fibrillation with RVR (HCC) Active Problems:   HTN (hypertension)   CAD (coronary artery disease)   Hypokalemia   Diabetes mellitus without complication (HCC)   Chest pain   HLD (hyperlipidemia)   COPD (chronic obstructive pulmonary disease) (HCC)   Depression with anxiety   Psoriasis   Atrial fibrillation with RVR ([place]): HR is 130-150s initally.  Patient was given 10 mg of Cardizem without significant improvement, then Cardizem drip is started.  Heart rate improved to 80-90s.  -Placed on progressive and for observation -Continue Cardizem drip -Continue home Cardizem 180 mg daily and metoprolol 50 mg twice daily  History of CAD (coronary artery disease) and chest pain: Patient's chest pain has subsided after heart rate is controlled, indicating possible demand  ischemia.  Initial troponin negative -Trend troponin -Aspirin, Lipitor, imdur -prn nitroglycerin, as needed fentanyl for chest pain  HTN (hypertension) -IV hydralazine as needed -Cardizem,  Cozaar, metoprolol  Hypokalemia: Potassium 3.4.  Magnesium 1.8 -Repleted potassium -Give 1 g of magnesium sulfate  Diabetes mellitus without complication: Recent G4P 6.4, well controlled.  Patient is taking metformin -SSI  HLD (hyperlipidemia) -Lipitor  COPD (chronic obstructive pulmonary disease) ([place]): Stable -Bronchodilators  Depression with anxiety -Continue home medications  Psoriasis -Patient is on weekly methotrexate (last dose was on Sunday, did not order today)         DVT ppx: On Pradaxa Code Status: Full code Family Communication:  Yes, patient's wife   at bed side.  Disposition Plan:  Anticipate discharge back to previous environment Consults called: None Admission status and Level of care: Progressive:    for obs   Status is: Observation  The patient remains OBS appropriate and will d/c before 2 midnights.        Date of Service 12/05/2021    Ivor Costa [facility]   If 7PM-7AM, please contact night-coverage [email or web address] 12/05/2021, 6:31 PM

## 2021-12-06 DIAGNOSIS — F418 Other specified anxiety disorders: Secondary | ICD-10-CM | POA: Diagnosis not present

## 2021-12-06 DIAGNOSIS — R079 Chest pain, unspecified: Secondary | ICD-10-CM | POA: Diagnosis not present

## 2021-12-06 DIAGNOSIS — I4891 Unspecified atrial fibrillation: Secondary | ICD-10-CM | POA: Diagnosis not present

## 2021-12-06 DIAGNOSIS — J449 Chronic obstructive pulmonary disease, unspecified: Secondary | ICD-10-CM | POA: Diagnosis not present

## 2021-12-06 LAB — LIPID PANEL
Cholesterol: 106 mg/dL (ref 0–200)
HDL: 33 mg/dL — ABNORMAL LOW (ref >40)
LDL Cholesterol: 51 mg/dL (ref 0–99)
Total CHOL/HDL Ratio: 3.2 RATIO
Triglycerides: 108 mg/dL (ref <150)
VLDL: 22 mg/dL (ref 0–40)

## 2021-12-06 LAB — HEMOGLOBIN A1C
Hgb A1c MFr Bld: 5.9 % — ABNORMAL HIGH (ref 4.8–5.6)
Mean Plasma Glucose: 123 mg/dL

## 2021-12-06 LAB — HIV ANTIBODY (ROUTINE TESTING W REFLEX): HIV Screen 4th Generation wRfx: NONREACTIVE

## 2021-12-06 LAB — TROPONIN I (HIGH SENSITIVITY): Troponin I (High Sensitivity): 15 ng/L (ref <18)

## 2021-12-06 LAB — CBG MONITORING, ED: Glucose-Capillary: 111 mg/dL — ABNORMAL HIGH (ref 70–99)

## 2021-12-09 NOTE — Discharge Summary (Signed)
Physician Discharge Summary   Patient: Darius Norris MRN: [id number] DOB: @DOB   Admit date:     12/05/2021  Discharge date: 12/06/2021  Discharge Physician: Max Sane   PCP: [facility]   Recommendations at discharge: 1. F/up with outpt providers as requested  Discharge Diagnoses Principal Problem:   Atrial fibrillation with RVR ([place]) Active Problems:   HTN (hypertension)   CAD (coronary artery disease)   Hypokalemia   Diabetes mellitus without complication (HCC)   Nonspecific chest pain   HLD (hyperlipidemia)   COPD (chronic obstructive pulmonary disease) (HCC)   Depression with anxiety   Psoriasis  Atrial fibrillation with RVR ([place]): HR is 130-150s initally.  Patient was given 10 mg of Cardizem without significant improvement, then Cardizem drip is started which helped control the Heart rate and improved to 80-90s. - Patient was adamant in leaving the hospital. After d/w Cardio [facility] Dr Saralyn Pilar) - he was requested to see cardio with appt at 12/22 @ 11:45 am. Patient was agreeable. He didn't have any further symptoms and was D/C home. -Continue home Cardizem 180 mg daily and metoprolol 50 mg twice daily   History of CAD (coronary artery disease) and chest pain: Patient's chest pain has subsided after heart rate is controlled, indicating  demand ischemia. serial troponin negative -continue Aspirin, Lipitor, imdur   HTN (hypertension) -Cardizem, Cozaar, metoprolol   Hypokalemia: Repleted   Diabetes mellitus without complication: Recent P8E 6.4, well controlled.  Patient is taking metformin   HLD (hyperlipidemia) -Lipitor   COPD (chronic obstructive pulmonary disease) ([place]): Stable -Bronchodilators   Depression with anxiety -Continue home medications   Psoriasis -Patient is on weekly methotrexate (last dose was on Sunday)      Pain control - [facility] Controlled Substance Reporting System database was reviewed. and patient was instructed,  not to drive, operate heavy machinery, perform activities at heights, swimming or participation in water activities or provide baby-sitting services while on Pain, Sleep and Anxiety Medications; until their outpatient Physician has advised to do so again. Also recommended to not to take more than prescribed Pain, Sleep and Anxiety Medications.    Disposition: Home Diet recommendation: Cardiac diet  DISCHARGE MEDICATION: Allergies as of 12/06/2021       Reactions   Ranitidine Hcl Shortness Of Breath   Zantac [ranitidine Hcl] Shortness Of Breath   Codeine Itching   Other reaction(s): PRURITIS   Erythromycin Hives   Other reaction(s): SWELLING (NON-SPECIFIC), HIVES, SWELLING (NON-SPECIFIC), HIVES   Hydrocodone    Other reaction(s): PRURITIS   Morphine Itching   Morphine And Related Itching   Tiotropium Bromide Monohydrate    Hydrocodone-acetaminophen Rash   Vicodin [hydrocodone-acetaminophen] Rash   Zantac [ranitidine] Rash   Other reaction(s): Eruption, HIVES, SHORTNESS OF BREATH, Eruption, HIVES, SHORTNESS OF BREATH        Medication List     STOP taking these medications    amitriptyline 10 MG tablet Commonly known as: ELAVIL   fluticasone 50 MCG/ACT nasal spray Commonly known as: FLONASE   lisinopril 40 MG tablet Commonly known as: ZESTRIL   potassium chloride 10 MEQ tablet Commonly known as: KLOR-CON M   traZODone 50 MG tablet Commonly known as: DESYREL       TAKE these medications    acetaminophen 325 MG tablet Commonly known as: TYLENOL Take 650 mg by mouth every 6 (six) hours as needed.   albuterol 108 (90 Base) MCG/ACT inhaler Commonly known as: VENTOLIN HFA Inhale into the lungs.  aspirin 81 MG EC tablet Take 1 tablet (81 mg total) by mouth daily.   atorvastatin 80 MG tablet Commonly known as: LIPITOR Take 1 tablet (80 mg total) by mouth at bedtime. What changed: how much to take   budesonide-formoterol 160-4.5 MCG/ACT inhaler Commonly  known as: SYMBICORT Inhale 2 puffs into the lungs 2 (two) times daily.   busPIRone 10 MG tablet Commonly known as: BUSPAR Take by mouth.   clobetasol 0.05 % external solution Commonly known as: TEMOVATE Apply topically.   dabigatran 150 MG Caps capsule Commonly known as: PRADAXA Take 150 mg by mouth 2 (two) times daily.   diltiazem 180 MG 24 hr capsule Commonly known as: CARDIZEM CD Take 1 capsule (180 mg total) by mouth daily.   folic acid 1 MG tablet Commonly known as: FOLVITE Take by mouth.   gabapentin 300 MG capsule Commonly known as: NEURONTIN Take 1 capsule by mouth 3 (three) times daily as needed. What changed: Another medication with the same name was removed. Continue taking this medication, and follow the directions you see here.   guaifenesin 400 MG Tabs tablet Commonly known as: HUMIBID E Take by mouth.   hydrocortisone 2.5 % cream Apply topically.   hydrOXYzine 25 MG tablet Commonly known as: ATARAX Take by mouth.   Ipratropium-Albuterol 20-100 MCG/ACT Aers respimat Commonly known as: COMBIVENT Inhale 1 puff into the lungs every 6 (six) hours as needed for wheezing or shortness of breath.   isosorbide mononitrate 60 MG 24 hr tablet Commonly known as: IMDUR Take 60 mg by mouth daily.   lidocaine 5 % ointment Commonly known as: XYLOCAINE Apply topically.   loratadine 10 MG tablet Commonly known as: CLARITIN Take 10 mg by mouth daily as needed for allergies.   losartan 25 MG tablet Commonly known as: COZAAR Take 1 tablet by mouth daily.   Melatonin 3 MG Caps Take 2 capsules by mouth at bedtime.   metFORMIN 500 MG 24 hr tablet Commonly known as: GLUCOPHAGE-XR Take 500 mg by mouth in the morning and at bedtime.   methotrexate 2.5 MG tablet Commonly known as: RHEUMATREX Take by mouth.   metoprolol tartrate 50 MG tablet Commonly known as: LOPRESSOR Take by mouth.   mometasone 220 MCG/INH inhaler Commonly known as: ASMANEX Take by  mouth.   omeprazole 20 MG capsule Commonly known as: PRILOSEC Take 20 mg by mouth daily.   senna 8.6 MG Tabs tablet Commonly known as: SENOKOT Take 1 tablet by mouth daily as needed for mild constipation.   senna-docusate 8.6-50 MG tablet Commonly known as: Senokot-S Take 1 tablet by mouth daily.   simethicone 80 MG chewable tablet Commonly known as: MYLICON Chew by mouth.   urea 20 % cream Commonly known as: CARMOL Apply topically.        Follow-up [place]. Schedule an appointment as soon as possible for a visit in 1 week(s).   Specialty: General Practice Why: [facility] Discharge F/UP Contact information: [address] [place] [address] [phone]         Clabe Seal, PA-C. Go on 12/08/2021.   Why: 11:45 AM as scheduled Contact information: [place] [address] [phone]                 Discharge Exam: Filed Weights   12/05/21 1213  Weight: 89.8 kg    General: Not in acute distress HEENT:       Eyes:  PERRL, EOMI, no scleral icterus.       ENT: No discharge from the ears and nose, no pharynx injection, no tonsillar enlargement.        Neck: No JVD, no bruit, no mass felt. Heme: No neck lymph node enlargement. Cardiac: S1/S2, irregularly irregular rhythm, no murmurs, No gallops or rubs. Respiratory: No rales, wheezing, rhonchi or rubs. GI: Soft, nondistended, nontender, no rebound pain, no organomegaly, BS present. GU: No hematuria Ext: Has trace leg edema bilaterally. 1+DP/PT pulse bilaterally. Musculoskeletal: No joint deformities, No joint redness or warmth, no limitation of ROM in spin. Skin: has psoriasis rash in all extremities Neuro: Alert, oriented X3, cranial nerves II-XII grossly intact, moves all extremities normally. Psych: Patient is not psychotic, no suicidal or hemocidal ideation.    Condition at discharge: good  The results of significant  diagnostics from this hospitalization (including imaging, microbiology, ancillary and laboratory) are listed below for reference.   Imaging Studies: DG Abd 1 View  Result Date: 12/05/2021 CLINICAL DATA:  Abdominal pain. EXAM: ABDOMEN - 1 VIEW COMPARISON:  CT abdomen pelvis dated 12/11/2017. FINDINGS: There is mild gaseous distension of the stomach. No bowel dilatation or evidence of obstruction. Air is noted within the colon. No free air. Hernia repair mesh noted with the right lower pelvis. No acute osseous pathology. IMPRESSION: No bowel obstruction. Electronically Signed   By: Anner Crete M.D.   On: 12/05/2021 20:44   DG Chest Portable 1 View  Result Date: 12/05/2021 CLINICAL DATA:  Atrial fibrillation, shortness of breath and chest pain for 2 days, coronary artery disease post MI, COPD, hypertension EXAM: PORTABLE CHEST 1 VIEW COMPARISON:  Portable exam 1247 hours compared to 01/05/2019 FINDINGS: Upper normal heart size. Mediastinal contours and pulmonary vascularity normal. Atherosclerotic calcification aorta. Chronic accentuation of bibasilar markings with superimposed atelectasis versus consolidation at LEFT lung base. Upper lungs clear with underlying emphysematous changes noted. No pleural effusion or pneumothorax. IMPRESSION: Emphysematous changes with chronic bibasilar interstitial lung disease and mild superimposed LEFT basilar atelectasis versus infiltrate. Aortic Atherosclerosis (ICD10-I70.0) and Emphysema (ICD10-J43.9). Electronically Signed   By: Lavonia Dana M.D.   On: 12/05/2021 12:58    Microbiology: Results for orders placed or performed during the hospital encounter of 12/05/21  Resp Panel by RT-PCR (Flu A&B, Covid) Nasopharyngeal Swab     Status: None   Collection Time: 12/05/21 12:23 PM   Specimen: Nasopharyngeal Swab; Nasopharyngeal(NP) swabs in vial transport medium  Result Value Ref Range Status   SARS Coronavirus 2 by RT PCR NEGATIVE NEGATIVE Final    Comment:  (NOTE) SARS-CoV-2 target nucleic acids are NOT DETECTED.  The SARS-CoV-2 RNA is generally detectable in upper respiratory specimens during the acute phase of infection. The lowest concentration of SARS-CoV-2 viral copies this assay can detect is 138 copies/mL. A negative result does not preclude SARS-Cov-2 infection and should not be used as the sole basis for treatment or other patient management decisions. A negative result may occur with  improper specimen collection/handling, submission of specimen other than nasopharyngeal swab, presence of viral mutation(s) within the areas targeted by this assay, and inadequate number of viral copies(<138 copies/mL). A negative result must be combined with clinical observations, patient history, and epidemiological information. The expected result is Negative.  Fact Sheet for Patients:  EntrepreneurPulse.com.au  Fact Sheet for Healthcare Providers:  IncredibleEmployment.be  This test is no t yet approved or cleared by the [place] FDA and  has been authorized for detection and/or diagnosis of SARS-CoV-2 by  FDA under an Emergency Use Authorization (EUA). This EUA will remain  in effect (meaning this test can be used) for the duration of the COVID-19 declaration under Section 564(b)(1) of the Act, 21 U.S.C.section 360bbb-3(b)(1), unless the authorization is terminated  or revoked sooner.       Influenza A by PCR NEGATIVE NEGATIVE Final   Influenza B by PCR NEGATIVE NEGATIVE Final    Comment: (NOTE) The Xpert Xpress SARS-CoV-2/FLU/RSV plus assay is intended as an aid in the diagnosis of influenza from Nasopharyngeal swab specimens and should not be used as a sole basis for treatment. Nasal washings and aspirates are unacceptable for Xpert Xpress SARS-CoV-2/FLU/RSV testing.  Fact Sheet for Patients: EntrepreneurPulse.com.au  Fact Sheet for Healthcare  Providers: IncredibleEmployment.be  This test is not yet approved or cleared by the [place] FDA and has been authorized for detection and/or diagnosis of SARS-CoV-2 by FDA under an Emergency Use Authorization (EUA). This EUA will remain in effect (meaning this test can be used) for the duration of the COVID-19 declaration under Section 564(b)(1) of the Act, 21 U.S.C. section 360bbb-3(b)(1), unless the authorization is terminated or revoked.  Performed at [facility], [address] [place], [place] [address]     Labs: CBC: Recent Labs  Lab 12/05/21 1223  WBC 10.1  NEUTROABS 7.3  HGB 13.6  HCT 42.7  MCV 91.2  PLT 859   Basic Metabolic Panel: Recent Labs  Lab 12/05/21 1223 12/05/21 2137  NA 140 137  K 3.4* 4.0  CL 103 102  CO2 33* 29  GLUCOSE 140* 121*  BUN 10 14  CREATININE 0.85 0.91  CALCIUM 8.7* 8.9  MG 1.8  --    Liver Function Tests: Recent Labs  Lab 12/05/21 1223  AST 20  ALT 20  ALKPHOS 103  BILITOT 0.9  PROT 7.1  ALBUMIN 4.3   CBG: Recent Labs  Lab 12/05/21 1639 12/05/21 2132 12/06/21 0711  GLUCAP 151* 129* 111*    Discharge time spent: greater than 30 minutes.  Signed:  Max Sane MD.  [facility] 12/09/2021

## 2021-12-16 ENCOUNTER — Emergency Department: Payer: No Typology Code available for payment source

## 2021-12-16 ENCOUNTER — Emergency Department
Admission: EM | Admit: 2021-12-16 | Discharge: 2021-12-16 | Disposition: A | Discharge status: Home / Self Care | Payer: No Typology Code available for payment source | Attending: Emergency Medicine | Admitting: Emergency Medicine

## 2021-12-16 ENCOUNTER — Encounter: Payer: Self-pay | Admitting: Intensive Care

## 2021-12-16 ENCOUNTER — Other Ambulatory Visit: Payer: Self-pay

## 2021-12-16 DIAGNOSIS — I10 Essential (primary) hypertension: Secondary | ICD-10-CM | POA: Insufficient documentation

## 2021-12-16 DIAGNOSIS — E119 Type 2 diabetes mellitus without complications: Secondary | ICD-10-CM | POA: Diagnosis not present

## 2021-12-16 DIAGNOSIS — J029 Acute pharyngitis, unspecified: Secondary | ICD-10-CM

## 2021-12-16 DIAGNOSIS — R079 Chest pain, unspecified: Secondary | ICD-10-CM

## 2021-12-16 DIAGNOSIS — Z7984 Long term (current) use of oral hypoglycemic drugs: Secondary | ICD-10-CM | POA: Insufficient documentation

## 2021-12-16 DIAGNOSIS — Z7951 Long term (current) use of inhaled steroids: Secondary | ICD-10-CM | POA: Diagnosis not present

## 2021-12-16 DIAGNOSIS — Z79899 Other long term (current) drug therapy: Secondary | ICD-10-CM | POA: Diagnosis not present

## 2021-12-16 DIAGNOSIS — J449 Chronic obstructive pulmonary disease, unspecified: Secondary | ICD-10-CM | POA: Insufficient documentation

## 2021-12-16 DIAGNOSIS — U071 COVID-19: Secondary | ICD-10-CM | POA: Insufficient documentation

## 2021-12-16 DIAGNOSIS — J189 Pneumonia, unspecified organism: Secondary | ICD-10-CM | POA: Insufficient documentation

## 2021-12-16 DIAGNOSIS — I2510 Atherosclerotic heart disease of native coronary artery without angina pectoris: Secondary | ICD-10-CM | POA: Diagnosis not present

## 2021-12-16 DIAGNOSIS — Z87891 Personal history of nicotine dependence: Secondary | ICD-10-CM | POA: Diagnosis not present

## 2021-12-16 DIAGNOSIS — R070 Pain in throat: Secondary | ICD-10-CM | POA: Diagnosis present

## 2021-12-16 MED ORDER — DOXYCYCLINE MONOHYDRATE 100 MG PO TABS
100.0000 mg | ORAL_TABLET | Freq: Two times a day (BID) | ORAL | 0 refills | Status: AC
Start: 2021-12-16 — End: 2021-12-21

## 2021-12-16 MED ORDER — LIDOCAINE VISCOUS HCL 2 % MT SOLN
15.0000 mL | Freq: Once | OROMUCOSAL | Status: AC
  Administered 2021-12-16: 15:00:00 15 mL via OROMUCOSAL
  Filled 2021-12-16: qty 15

## 2021-12-16 MED ORDER — AMOXICILLIN-POT CLAVULANATE 875-125 MG PO TABS: 1.0000 | ORAL_TABLET | Freq: Two times a day (BID) | ORAL | 0 refills | Status: AC

## 2021-12-16 MED ORDER — DOXYCYCLINE HYCLATE 100 MG PO TABS
100.0000 mg | ORAL_TABLET | Freq: Once | ORAL | Status: AC
  Administered 2021-12-16: 17:00:00 100 mg via ORAL
  Filled 2021-12-16: qty 1

## 2021-12-16 MED ORDER — ACETAMINOPHEN 500 MG PO TABS
1000.0000 mg | ORAL_TABLET | Freq: Once | ORAL | Status: AC
  Administered 2021-12-16: 15:00:00 1000 mg via ORAL
  Filled 2021-12-16: qty 2

## 2021-12-16 MED ORDER — AMOXICILLIN-POT CLAVULANATE 875-125 MG PO TABS
1.0000 | ORAL_TABLET | Freq: Once | ORAL | Status: AC
  Administered 2021-12-16: 17:00:00 1 via ORAL
  Filled 2021-12-16: qty 1

## 2021-12-16 NOTE — ED Triage Notes (Signed)
Patient c/o throat swelling that started this AM. He believes it is due to his covid medicine he started yesterday. Tested positive at [facility] 12/23. Patient able to swallow saliva with no issues.

## 2021-12-16 NOTE — Discharge Instructions (Signed)
I suspect that your sore throat is related to the COVID viral infection.  You can take Tylenol for the pain.  Please return to the emergency department if you are unable to eat or drink or you are developing worsening shortness of breath.  Your x-ray looks like you could be developing a pneumonia.  While it is possible this is from [place] we will treat you with a 5-day course of antibiotics to treat any potential bacterial pneumonia.  Please follow-up with your primary care provider at the [facility] tomorrow.

## 2021-12-16 NOTE — ED Provider Notes (Signed)
Emergency Medicine Provider Triage Evaluation Note  Darius Norris , a 70 y.o. male  was evaluated in triage.  Pt complains of sore throat and swelling sensation that started this morning after taking Lagevrio for COVID. He has taken 8 doses in total. Throat and chest feels tight. Still able to speak and swallow.   Review of Systems  Positive: Throat tightness, chest tightness Negative: Fever  Physical Exam  There were no vitals taken for this visit. Gen:   Awake, no distress   Resp:  Normal effort  MSK:   Moves extremities without difficulty  Other:    Medical Decision Making  Medically screening exam initiated at 11:32 AM.  Appropriate orders placed.  Darius Norris was informed that the remainder of the evaluation will be completed by another provider, this initial triage assessment does not replace that evaluation, and the importance of remaining in the ED until their evaluation is complete.   [name], FNP 12/16/21 1138    Harvest Dark, MD 12/16/21 1204

## 2021-12-16 NOTE — ED Notes (Signed)
See triage note  presents with sore throat  feels like throat is swelling  states tested positive for COVID  was placed on meds yesterday  states he took 2 doses   developed increased SOB  thinks he is having an allergic rxn

## 2021-12-16 NOTE — ED Provider Notes (Signed)
[facility]  ____________________________________________   Event Date/Time   First MD Initiated Contact with Patient 12/16/21 1426     (approximate)  I have reviewed the triage vital signs and the nursing notes.   HISTORY  Chief Complaint Oral Swelling    HPI Darius Norris is a 70 y.o. male with past medical history of A. fib, CAD, COPD, hypertension who presents with concern for throat pain.  Patient was recently diagnosed with COVID about a week ago.  He was started on antiviral which she has taken 2 doses of.  Last dose was around 10 PM last night.  He woke up around 130 feeling throat pain and having difficulty swallowing.  He also had some dyspnea but this is now resolved.  He has ongoing feeling of throat pain but is able to tolerate his secretions.  Has not had anything to eat or drink today.  Does endorse hoarseness.  He has no pain with neck movements.  No fevers or chills.  No history of allergic reaction.  Denies nausea vomiting or abdominal pain.  Denies shortness of breath or chest pain currently.  He denies any facial swelling.     Past Medical History:  Diagnosis Date   A-fib [facility])    CAD (coronary artery disease)    Colitis    COPD (chronic obstructive pulmonary disease) (HCC)    Emphysema lung (HCC)    GERD (gastroesophageal reflux disease)    Hypertension    MI (myocardial infarction) ([place]) 1999    Patient Active Problem List   Diagnosis Date Noted   Atrial fibrillation with RVR ([place]) 12/05/2021   HLD (hyperlipidemia) 12/05/2021   COPD (chronic obstructive pulmonary disease) ([place]) 12/05/2021   Depression with anxiety 12/05/2021   Psoriasis 12/05/2021   Acute hypoxemic respiratory failure ([place]) 05/28/2018   A-fib ([place]) 05/02/2018   Nonspecific chest pain 11/17/2017   Unstable angina ([place]) 09/04/2016   Uncontrolled hypertension 09/04/2016   Hypokalemia 09/04/2016   Leukocytosis 09/04/2016   Diabetes mellitus without  complication ([place]) 32/99/2426   COPD exacerbation ([place]) 12/15/2015   Elevated troponin 12/15/2015   HTN (hypertension) 12/15/2015   GERD (gastroesophageal reflux disease) 12/15/2015   CAD (coronary artery disease) 12/15/2015   Angina pectoris ([place]) 12/15/2015    Past Surgical History:  Procedure Laterality Date   ABDOMINAL HERNIA REPAIR Bilateral    CHOLECYSTECTOMY     COLONOSCOPY WITH PROPOFOL N/A 02/01/2018   Procedure: COLONOSCOPY WITH PROPOFOL;  Surgeon: Jonathon Bellows, MD;  Location: [facility] ENDOSCOPY;  Service: Gastroenterology;  Laterality: N/A;   KNEE ARTHROSCOPY Bilateral     Prior to Admission medications   Medication Sig Start Date End Date Taking? Authorizing Provider  amoxicillin-clavulanate (AUGMENTIN) 875-125 MG tablet Take 1 tablet by mouth 2 (two) times daily for 5 days. 12/16/21 12/21/21 Yes Rada Hay, MD  doxycycline (ADOXA) 100 MG tablet Take 1 tablet (100 mg total) by mouth 2 (two) times daily for 5 days. 12/16/21 12/21/21 Yes Rada Hay, MD  acetaminophen (TYLENOL) 325 MG tablet Take 650 mg by mouth every 6 (six) hours as needed.    [provider]  albuterol (VENTOLIN HFA) 108 (90 Base) MCG/ACT inhaler Inhale into the lungs. 03/30/21   [provider]  aspirin EC 81 MG EC tablet Take 1 tablet (81 mg total) by mouth daily. 06/09/18   [name], Avel Peace, MD  atorvastatin (LIPITOR) 80 MG tablet Take 1 tablet (80 mg total) by mouth at bedtime. Patient taking differently: Take  40 mg by mouth at bedtime. 11/18/17   Vaughan Basta, MD  budesonide-formoterol (SYMBICORT) 160-4.5 MCG/ACT inhaler Inhale 2 puffs into the lungs 2 (two) times daily.    [provider]  busPIRone (BUSPAR) 10 MG tablet Take by mouth.    [provider]  clobetasol (TEMOVATE) 0.05 % external solution Apply topically. 02/28/21   [provider]  dabigatran (PRADAXA) 150 MG CAPS capsule Take 150 mg by mouth 2 (two) times daily.    [provider]  diltiazem (CARDIZEM CD) 180 MG 24 hr capsule Take 1 capsule (180 mg total) by mouth daily. 06/09/18   [name], Avel Peace, MD  folic acid (FOLVITE) 1 MG tablet Take by mouth. 03/30/21   [provider]  gabapentin (NEURONTIN) 300 MG capsule Take 1 capsule by mouth 3 (three) times daily as needed. 10/10/21   [provider]  guaifenesin (HUMIBID E) 400 MG TABS tablet Take by mouth. 03/30/21   [provider]  hydrocortisone 2.5 % cream Apply topically. 02/28/21   [provider]  hydrOXYzine (ATARAX/VISTARIL) 25 MG tablet Take by mouth.    [provider]  Ipratropium-Albuterol (COMBIVENT) 20-100 MCG/ACT AERS respimat Inhale 1 puff into the lungs every 6 (six) hours as needed for wheezing or shortness of breath.    [provider]  isosorbide mononitrate (IMDUR) 60 MG 24 hr tablet Take 60 mg by mouth daily.    [provider]  lidocaine (XYLOCAINE) 5 % ointment Apply topically. 03/30/21   [provider]  loratadine (CLARITIN) 10 MG tablet Take 10 mg by mouth daily as needed for allergies.     [provider]  losartan (COZAAR) 25 MG tablet Take 1 tablet by mouth daily. 11/25/21   [provider]  Melatonin 3 MG CAPS Take 2 capsules by mouth at bedtime. 10/10/21   [provider]  metFORMIN (GLUCOPHAGE-XR) 500 MG 24 hr tablet Take 500 mg by mouth in the morning and at bedtime. 03/30/21   [provider]  methotrexate (RHEUMATREX) 2.5 MG tablet Take by mouth. 03/02/21   [provider]  metoprolol tartrate (LOPRESSOR) 50 MG tablet Take by mouth. 02/14/19   [provider]  mometasone [facility]) 220 MCG/INH inhaler Take by mouth. 03/10/21   [provider]  omeprazole (PRILOSEC) 20 MG capsule Take 20 mg by mouth daily.     [provider]  senna (SENOKOT) 8.6 MG TABS tablet Take 1 tablet by mouth daily as needed for mild constipation.    [provider]  senna-docusate (SENOKOT-S) 8.6-50 MG tablet Take 1 tablet by mouth daily.    [provider]  simethicone (MYLICON) 80 MG chewable tablet Chew by mouth. 03/30/21   [provider]  urea (CARMOL) 20 % cream Apply topically. 11/29/20   [provider]    Allergies Ranitidine hcl, Zantac [ranitidine hcl], Codeine, Erythromycin, Hydrocodone, Morphine, Morphine and related, Tiotropium bromide monohydrate, Hydrocodone-acetaminophen, Vicodin [hydrocodone-acetaminophen], and Zantac [ranitidine]  Family History  Problem Relation Age of Onset   CAD Father    Stroke Father    CAD Sister    Stroke Brother     Social History Social History   Tobacco Use   Smoking status: Former    Packs/day: 1.00    Years: 30.00    Pack years: 30.00    Types: Cigarettes    Quit date: 12/18/2001    Years since quitting: 20.0   Smokeless tobacco: Never  Vaping Use   Vaping Use:  Never used  Substance Use Topics   Alcohol use: No   Drug use: No    Review of Systems   Review of Systems  HENT:  Positive for congestion, postnasal drip, rhinorrhea, trouble swallowing and voice change.   Respiratory:  Positive for cough. Negative for shortness of breath.   Gastrointestinal:  Negative for abdominal pain, nausea and vomiting.  Skin:  Negative for rash.  All other systems reviewed and are negative.  Physical Exam Updated Vital Signs BP (!) 142/68    Pulse 94    Temp 98.2 F (36.8 C) (Oral)    Resp 19    Ht 5\' 6"  (1.676 m)    Wt 88.5 kg    SpO2 99%    BMI 31.47 kg/m   Physical Exam Vitals and nursing note reviewed.  Constitutional:      General: He is not in acute distress.    Appearance: Normal appearance.  HENT:     Head: Normocephalic and atraumatic.     Comments: Posterior oropharynx with mild erythema bilaterally, no exudate or significant swelling, uvula is midline, patient is tolerating secretions, no trismus No significant cervical lymphadenopathy, no  pain with range of the neck    Nose: Congestion present.  Eyes:     General: No scleral icterus.    Conjunctiva/sclera: Conjunctivae normal.  Pulmonary:     Effort: Pulmonary effort is normal. No respiratory distress.     Breath sounds: Normal breath sounds. No wheezing.  Musculoskeletal:        General: No deformity or signs of injury.     Cervical back: Normal range of motion.  Skin:    Coloration: Skin is not jaundiced or pale.  Neurological:     General: No focal deficit present.     Mental Status: He is alert and oriented to person, place, and time. Mental status is at baseline.  Psychiatric:        Mood and Affect: Mood normal.        Behavior: Behavior normal.     LABS (all labs ordered are listed, but only abnormal results are displayed)  Labs Reviewed - No data to display ____________________________________________  EKG   ____________________________________________  RADIOLOGY Almeta Monas, personally viewed and evaluated these images (plain radiographs) as part of my medical decision making, as well as reviewing the written report by the radiologist.  ED MD interpretation:      ____________________________________________   PROCEDURES  Procedure(s) performed (including Critical Care):  Procedures   ____________________________________________   INITIAL IMPRESSION / [place] / ED COURSE     Patient is a 70 year old male recently diagnosed with COVID who presents with throat pain and hoarseness.  Patient was concerned about an allergic reaction to the antiviral he has been taking for COVID.  Last took his second dose yesterday around 10 PM and symptoms started around 1:30 AM.  Patient does have associated pain in the throat which makes me less concerned about allergic reaction.  He also has hoarseness and congestion cough and postnasal drip.  Overall he appears chronically ill on exam but not acutely in distress.  He is not dyspneic  with clear lungs.  He does have erythema of the posterior oropharynx but no swelling exudate and uvula is midline.  He is tolerating secretions, no neck swelling or cervical lymphadenopathy.  My suspicion for an allergic reaction is low and I think this is likely pharyngitis/laryngitis secondary to the underlying COVID.  Will treat supportively  with Tylenol and viscous lidocaine for the throat and p.o. challenge.  We will also get a chest x-ray given his significant cough and COPD to rule out neumonia.  Otherwise anticipate discharge.  Out of an abundance of caution I did recommend that he stop taking the antiviral.  Patient's chest x-ray does show potential bilateral lower lobe infiltrates.  This is somewhat worse compared to his chest x-ray done 11 days ago when he was admitted for A. fib with RVR.  In the setting of his persistent cough will treat as community-acquired pneumonia with Augmentin and Doxy.  Is possible that this is just COVID pneumonia but difficult to tell.  Given he has no increased oxygen requirement and has no subjective dyspnea I feel that he is appropriate for outpatient management.  We discussed tricked return precautions for worsening dyspnea or inability to tolerate p.o., etc.  Patient plans to follow-up with his primary care provider at the [facility] tomorrow.      ____________________________________________   FINAL CLINICAL IMPRESSION(S) / ED DIAGNOSES  Final diagnoses:  Pharyngitis, unspecified etiology  Community acquired pneumonia, unspecified laterality     ED Discharge Orders          Ordered    doxycycline (ADOXA) 100 MG tablet  2 times daily        12/16/21 1635    amoxicillin-clavulanate (AUGMENTIN) 875-125 MG tablet  2 times daily        12/16/21 1635             Note:  This document was prepared using Dragon voice recognition software and may include unintentional dictation errors.    Rada Hay, MD 12/16/21 [phone]

## 2022-02-08 ENCOUNTER — Ambulatory Visit (INDEPENDENT_AMBULATORY_CARE_PROVIDER_SITE_OTHER): Payer: No Typology Code available for payment source | Admitting: Podiatry

## 2022-02-08 ENCOUNTER — Other Ambulatory Visit: Payer: Self-pay

## 2022-02-08 DIAGNOSIS — M79675 Pain in left toe(s): Secondary | ICD-10-CM

## 2022-02-08 DIAGNOSIS — K5732 Diverticulitis of large intestine without perforation or abscess without bleeding: Secondary | ICD-10-CM | POA: Insufficient documentation

## 2022-02-08 DIAGNOSIS — E119 Type 2 diabetes mellitus without complications: Secondary | ICD-10-CM | POA: Diagnosis not present

## 2022-02-08 DIAGNOSIS — K512 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: Secondary | ICD-10-CM | POA: Insufficient documentation

## 2022-02-08 DIAGNOSIS — H25011 Cortical age-related cataract, right eye: Secondary | ICD-10-CM | POA: Insufficient documentation

## 2022-02-08 DIAGNOSIS — U071 COVID-19: Secondary | ICD-10-CM | POA: Insufficient documentation

## 2022-02-08 DIAGNOSIS — M790 Rheumatism, unspecified: Secondary | ICD-10-CM | POA: Insufficient documentation

## 2022-02-08 DIAGNOSIS — M722 Plantar fascial fibromatosis: Secondary | ICD-10-CM | POA: Diagnosis not present

## 2022-02-08 DIAGNOSIS — B351 Tinea unguium: Secondary | ICD-10-CM | POA: Diagnosis not present

## 2022-02-08 DIAGNOSIS — Z48810 Encounter for surgical aftercare following surgery on the sense organs: Secondary | ICD-10-CM | POA: Insufficient documentation

## 2022-02-08 DIAGNOSIS — M797 Fibromyalgia: Secondary | ICD-10-CM | POA: Insufficient documentation

## 2022-02-08 DIAGNOSIS — M7031 Other bursitis of elbow, right elbow: Secondary | ICD-10-CM | POA: Insufficient documentation

## 2022-02-08 DIAGNOSIS — K1370 Unspecified lesions of oral mucosa: Secondary | ICD-10-CM | POA: Insufficient documentation

## 2022-02-08 DIAGNOSIS — H2589 Other age-related cataract: Secondary | ICD-10-CM | POA: Insufficient documentation

## 2022-02-08 DIAGNOSIS — H2513 Age-related nuclear cataract, bilateral: Secondary | ICD-10-CM | POA: Insufficient documentation

## 2022-02-08 DIAGNOSIS — M79674 Pain in right toe(s): Secondary | ICD-10-CM | POA: Diagnosis not present

## 2022-02-08 DIAGNOSIS — E538 Deficiency of other specified B group vitamins: Secondary | ICD-10-CM | POA: Insufficient documentation

## 2022-02-08 DIAGNOSIS — H9319 Tinnitus, unspecified ear: Secondary | ICD-10-CM | POA: Insufficient documentation

## 2022-02-08 DIAGNOSIS — Z0389 Encounter for observation for other suspected diseases and conditions ruled out: Secondary | ICD-10-CM | POA: Insufficient documentation

## 2022-02-08 DIAGNOSIS — R0982 Postnasal drip: Secondary | ICD-10-CM | POA: Insufficient documentation

## 2022-02-08 DIAGNOSIS — I119 Hypertensive heart disease without heart failure: Secondary | ICD-10-CM | POA: Insufficient documentation

## 2022-02-08 DIAGNOSIS — R1312 Dysphagia, oropharyngeal phase: Secondary | ICD-10-CM | POA: Insufficient documentation

## 2022-02-08 NOTE — Progress Notes (Signed)
°  Subjective:  Patient ID: Darius Norris, male    DOB: December 09, 1951,  MRN: [id number]  Chief Complaint  Patient presents with   Diabetes    NP/ DIABETIC FOOT CARE, PAIN IN FEET/ NREQ     71 y.o. male presents with the above complaint. History confirmed with patient.  Presents today as a community care referral from the [place] for diabetic foot care and painful thickened elongated nails.  He also has arch pain in both feet and he has been doing stretching exercises for this but has not alleviated it.  Objective:  Physical Exam: warm, good capillary refill, no trophic changes or ulcerative lesions, normal DP and PT pulses, normal monofilament exam, normal sensory exam, and mild arch pain bilateral. Left Foot: dystrophic yellowed discolored nail plates with subungual debris Right Foot: dystrophic yellowed discolored nail plates with subungual debris  Assessment:   1. Pain due to onychomycosis of toenails of both feet   2. Diabetes mellitus without complication ([place])   3. Plantar fasciitis, bilateral      Plan:  Patient was evaluated and treated and all questions answered.  Patient educated on diabetes. Discussed proper diabetic foot care and discussed risks and complications of disease. Educated patient in depth on reasons to return to the office immediately should he/she discover anything concerning or new on the feet. All questions answered. Discussed proper shoes as well.   Discussed the etiology and treatment options for the condition in detail with the patient. Educated patient on the topical and oral treatment options for mycotic nails. Recommended debridement of the nails today. Sharp and mechanical debridement performed of all painful and mycotic nails today. Nails debrided in length and thickness using a nail nipper to level of comfort. Discussed treatment options including appropriate shoe gear. Follow up as needed for painful nails.   Planter fasciitis think he has his largest  under control I do think he would benefit from arch supports in the shoes and I wrote him a prescription for this for [facility] and he will have this scheduled to be fitted.  Return in about 10 weeks (around 04/19/2022) for at risk diabetic foot care.

## 2022-02-22 ENCOUNTER — Other Ambulatory Visit: Payer: Self-pay

## 2022-02-22 ENCOUNTER — Encounter: Payer: Self-pay | Admitting: *Deleted

## 2022-02-22 ENCOUNTER — Emergency Department: Payer: No Typology Code available for payment source

## 2022-02-22 ENCOUNTER — Emergency Department
Admission: EM | Admit: 2022-02-22 | Discharge: 2022-02-22 | Disposition: A | Discharge status: Home / Self Care | Payer: No Typology Code available for payment source | Attending: Emergency Medicine | Admitting: Emergency Medicine

## 2022-02-22 DIAGNOSIS — I10 Essential (primary) hypertension: Secondary | ICD-10-CM | POA: Insufficient documentation

## 2022-02-22 DIAGNOSIS — J449 Chronic obstructive pulmonary disease, unspecified: Secondary | ICD-10-CM | POA: Diagnosis not present

## 2022-02-22 DIAGNOSIS — R0789 Other chest pain: Secondary | ICD-10-CM | POA: Diagnosis not present

## 2022-02-22 DIAGNOSIS — I2510 Atherosclerotic heart disease of native coronary artery without angina pectoris: Secondary | ICD-10-CM | POA: Diagnosis not present

## 2022-02-22 DIAGNOSIS — R7989 Other specified abnormal findings of blood chemistry: Secondary | ICD-10-CM | POA: Insufficient documentation

## 2022-02-22 DIAGNOSIS — Z20822 Contact with and (suspected) exposure to covid-19: Secondary | ICD-10-CM | POA: Insufficient documentation

## 2022-02-22 DIAGNOSIS — R918 Other nonspecific abnormal finding of lung field: Secondary | ICD-10-CM | POA: Diagnosis not present

## 2022-02-22 DIAGNOSIS — R079 Chest pain, unspecified: Secondary | ICD-10-CM

## 2022-02-22 DIAGNOSIS — R000 Tachycardia, unspecified: Secondary | ICD-10-CM | POA: Insufficient documentation

## 2022-02-22 LAB — COMPREHENSIVE METABOLIC PANEL
ALT: 21 U/L (ref 0–44)
AST: 18 U/L (ref 15–41)
Albumin: 3.9 g/dL (ref 3.5–5.0)
Alkaline Phosphatase: 84 U/L (ref 38–126)
Anion gap: 7 (ref 5–15)
BUN: 13 mg/dL (ref 8–23)
CO2: 31 mmol/L (ref 22–32)
Calcium: 9.2 mg/dL (ref 8.9–10.3)
Chloride: 104 mmol/L (ref 98–111)
Creatinine, Ser: 0.99 mg/dL (ref 0.61–1.24)
GFR, Estimated: >60 mL/min (ref >60)
Glucose, Bld: 145 mg/dL — ABNORMAL HIGH (ref 70–99)
Potassium: 3.6 mmol/L (ref 3.5–5.1)
Sodium: 142 mmol/L (ref 135–145)
Total Bilirubin: 0.7 mg/dL (ref 0.3–1.2)
Total Protein: 6.9 g/dL (ref 6.5–8.1)

## 2022-02-22 LAB — CBC WITH DIFFERENTIAL/PLATELET
Abs Immature Granulocytes: 0.06 10*3/uL (ref 0.00–0.07)
Basophils Absolute: 0 10*3/uL (ref 0.0–0.1)
Basophils Relative: 0 %
Eosinophils Absolute: 0.1 10*3/uL (ref 0.0–0.5)
Eosinophils Relative: 1 %
HCT: 40.5 % (ref 39.0–52.0)
Hemoglobin: 12.6 g/dL — ABNORMAL LOW (ref 13.0–17.0)
Immature Granulocytes: 1 %
Lymphocytes Relative: 17 %
Lymphs Abs: 1.9 10*3/uL (ref 0.7–4.0)
MCH: 28.3 pg (ref 26.0–34.0)
MCHC: 31.1 g/dL (ref 30.0–36.0)
MCV: 90.8 fL (ref 80.0–100.0)
Monocytes Absolute: 0.5 10*3/uL (ref 0.1–1.0)
Monocytes Relative: 5 %
Neutro Abs: 9 10*3/uL — ABNORMAL HIGH (ref 1.7–7.7)
Neutrophils Relative %: 76 %
Platelets: 261 10*3/uL (ref 150–400)
RBC: 4.46 MIL/uL (ref 4.22–5.81)
RDW: 17.7 % — ABNORMAL HIGH (ref 11.5–15.5)
WBC: 11.6 10*3/uL — ABNORMAL HIGH (ref 4.0–10.5)
nRBC: 0 % (ref 0.0–0.2)

## 2022-02-22 LAB — RESP PANEL BY RT-PCR (FLU A&B, COVID) ARPGX2
Influenza A by PCR: NEGATIVE
Influenza B by PCR: NEGATIVE
SARS Coronavirus 2 by RT PCR: NEGATIVE

## 2022-02-22 LAB — BRAIN NATRIURETIC PEPTIDE: B Natriuretic Peptide: 258.6 pg/mL — ABNORMAL HIGH (ref 0.0–100.0)

## 2022-02-22 LAB — TROPONIN I (HIGH SENSITIVITY)
Troponin I (High Sensitivity): 19 ng/L — ABNORMAL HIGH (ref <18)
Troponin I (High Sensitivity): 19 ng/L — ABNORMAL HIGH (ref <18)

## 2022-02-22 NOTE — ED Notes (Signed)
BIB [facility] from home for CP, onset 0000MN, described as indigestion, radiated to L arm, no relief with TUMS, h/o COPD, afib and MI, ntg and ASA given PTA, NSL L AC 18g PTA. CP decreased after ntg from 3/10 to 2/10. Painfree on arrival. CBG 171 PTA. ?

## 2022-02-22 NOTE — ED Notes (Signed)
Back from xray, no changes.  ?

## 2022-02-22 NOTE — ED Provider Notes (Signed)
3:25 PM  Assumed care at shift change.  Patient here with chest pain and elevated blood pressure.  No longer having any chest pain and blood pressure has also improved.  EKG shows ischemic abnormalities that are unchanged compared to previous.  First troponin is 19.  The second pending.  If negative, plan is to discharge home if patient is still feeling well. ? ?4:40 PM  Pt's repeat troponin is flat.  Continues to be asymptomatic and hemodynamically stable.  Will discharge with close outpatient follow-up.  Patient would prefer discharge home over admission to the hospital.  I feel this is reasonable.  Discussed return precautions. ? ? ?At this time, I do not feel there is any life-threatening condition present. I reviewed all nursing notes, vitals, pertinent previous records.  All lab and urine results, EKGs, imaging ordered have been independently reviewed and interpreted by myself.  I reviewed all available radiology reports from any imaging ordered this visit.  Based on my assessment, I feel the patient is safe to be discharged home without further emergent workup and can continue workup as an outpatient as needed. Discussed all findings, treatment plan as well as usual and customary return precautions with patient.  They verbalize understanding and are comfortable with this plan.  Outpatient follow-up has been provided as needed.  All questions have been answered. ? ?  ?Shellia Hartl, Delice Bison, DO ?02/22/22 1640 ? ?

## 2022-02-22 NOTE — ED Notes (Addendum)
No changes, VSS.  ?

## 2022-02-22 NOTE — ED Notes (Signed)
Pt alert, NAD, calm, interactive, to xray.  ?

## 2022-02-22 NOTE — ED Notes (Signed)
EDP Dr. Kerman Passey at [facility] ?

## 2022-02-22 NOTE — ED Provider Notes (Signed)
? ?[facility] ?Provider Note ? ? ? Event Date/Time  ? First MD Initiated Contact with Patient 02/22/22 1353   ?  (approximate) ? ?History  ? ?Chief Complaint: Chest Pain ? ?HPI ? ?Darius Norris is a 71 y.o. male with a past medical history of atrial fibrillation, CAD, COPD on 3 L of oxygen, hypertension, presents to the emergency department for chest pain.  According to the patient since last night he has been experiencing some chest discomfort which he describes as "indigestion."  Patient states he continued to feel some indigestion symptoms this morning so he checked his vitals noticed that his blood pressure and heart rate were elevated so he came to the emergency department for evaluation.  Patient's vitals are reassuring upon arrival blood pressure 148/77 with a pulse rate of 66.  Patient denies any symptoms currently.  Denies any shortness of breath nausea or diaphoresis. ? ?Physical Exam  ? ?Triage Vital Signs: ?ED Triage Vitals  ?Enc Vitals Group  ?   BP 02/22/22 1345 (!) 148/77  ?   Pulse Rate 02/22/22 1345 67  ?   Resp 02/22/22 1345 19  ?   Temp --   ?   Temp src --   ?   SpO2 02/22/22 1345 95 %  ?   Weight 02/22/22 1353 198 lb (89.8 kg)  ?   Height 02/22/22 1353 '5\' 6"'$  (1.676 m)  ?   Head Circumference --   ?   Peak Flow --   ?   Pain Score 02/22/22 1351 0  ?   Pain Loc --   ?   Pain Edu? --   ?   Excl. in [place]? --   ? ? ?Most recent vital signs: ?Vitals:  ? 02/22/22 1350 02/22/22 1355  ?BP:    ?Pulse: 60 66  ?Resp: 17 (!) 9  ?SpO2: 98% 99%  ? ? ?General: Awake, no distress.  ?CV:  Good peripheral perfusion.  Regular rate and rhythm  ?Resp:  Normal effort.  Equal breath sounds bilaterally.  ?Abd:  No distention.  Soft, nontender.  No rebound or guarding. ?Other:  No lower extremity edema. ? ? ?ED Results / Procedures / Treatments  ? ?EKG ? ?EKG viewed and interpreted by myself shows a sinus rhythm at 66 bpm with a narrow QRS, normal axis, normal intervals.  Patient has diffuse ST  changes mostly in the inferolateral leads with fairly significant ST depression laterally.  However when compared to his prior EKG 11/2021 there is no significant change. ? ?RADIOLOGY ? ?I personally viewed the chest x-ray images, no acute abnormality seen on my evaluation. ?Radiology is read the chest x-ray as increased interstitial markings suggest scarring versus interstitial pneumonia.  No focal consolidation. ? ? ?MEDICATIONS ORDERED IN ED: ?Medications - No data to display ? ? ?IMPRESSION / MDM / ASSESSMENT AND PLAN / ED COURSE  ?I reviewed the triage vital signs and the nursing notes. ? ?Patient presents emergency department for chest discomfort described as indigestion sensation last night as well as elevated vitals this morning.  Currently patient appears well denies any symptoms.  No chest pain or shortness of breath.  Patient is EKG although is quite abnormal is largely unchanged from past EKG.  We will check labs, cardiac enzymes, chest x-ray.  We will continue to closely monitor while awaiting results.  Patient agreeable to plan of care. ? ?Patient work-up so far is reassuring.  Chest x-ray shows increased interstitial markings could  be scar tissue versus pneumonia.  No pneumonia symptoms including cough or fever.  Lab work shows a overall normal CBC with an 11.6 white blood cell count.  Chemistry is reassuring.  BNP slightly elevated at 258.  However this is largely unchanged from 2 months ago.  COVID and flu are negative.  Troponin is 19 which again is largely unchanged from historical values.  Patient states he feels well denies any symptoms currently.  Patient wears 3 L chronically and currently satting 99%.  We will repeat a troponin after 2 hours, as long as her repeat troponin remains negative anticipate likely discharge home.  Patient care signed out to oncoming provider. ? ?FINAL CLINICAL IMPRESSION(S) / ED DIAGNOSES  ? ?Chest pain ? ?Note:  This document was prepared using Dragon voice  recognition software and may include unintentional dictation errors. ?  ?Harvest Dark, MD ?02/22/22 1511 ? ?

## 2022-02-22 NOTE — ED Triage Notes (Signed)
EDP into room, at [facility]. Pt alert, NAD, calm, interactive, resps e/u, skin W&D. Denies pain.  ?

## 2022-04-19 ENCOUNTER — Ambulatory Visit: Payer: No Typology Code available for payment source | Admitting: Podiatry

## 2022-05-08 ENCOUNTER — Other Ambulatory Visit: Payer: Self-pay

## 2022-05-08 ENCOUNTER — Encounter: Payer: No Typology Code available for payment source | Attending: Anesthesiology

## 2022-05-08 DIAGNOSIS — J449 Chronic obstructive pulmonary disease, unspecified: Secondary | ICD-10-CM

## 2022-05-08 NOTE — Progress Notes (Signed)
Virtual Visit completed. Patient informed on EP and RD appointment and 6 Minute walk test. Patient also informed of patient health questionnaires on My Chart. Patient Verbalizes understanding. Visit diagnosis can be found in [facility], patient is [place].

## 2022-05-16 ENCOUNTER — Inpatient Hospital Stay
Admission: EM | Admit: 2022-05-16 | Discharge: 2022-05-18 | DRG: 191 | Disposition: A | Discharge status: Home / Self Care | Payer: No Typology Code available for payment source | Attending: Internal Medicine | Admitting: Internal Medicine

## 2022-05-16 ENCOUNTER — Inpatient Hospital Stay
Admit: 2022-05-16 | Discharge: 2022-05-16 | Disposition: A | Discharge status: Home / Self Care | Payer: No Typology Code available for payment source | Attending: Internal Medicine | Admitting: Internal Medicine

## 2022-05-16 ENCOUNTER — Other Ambulatory Visit: Payer: Self-pay

## 2022-05-16 ENCOUNTER — Encounter: Payer: Self-pay | Admitting: Emergency Medicine

## 2022-05-16 ENCOUNTER — Emergency Department: Payer: No Typology Code available for payment source

## 2022-05-16 DIAGNOSIS — Z1152 Encounter for screening for COVID-19: Secondary | ICD-10-CM | POA: Diagnosis not present

## 2022-05-16 DIAGNOSIS — K219 Gastro-esophageal reflux disease without esophagitis: Secondary | ICD-10-CM | POA: Diagnosis present

## 2022-05-16 DIAGNOSIS — F418 Other specified anxiety disorders: Secondary | ICD-10-CM | POA: Diagnosis present

## 2022-05-16 DIAGNOSIS — Z79899 Other long term (current) drug therapy: Secondary | ICD-10-CM

## 2022-05-16 DIAGNOSIS — Z7901 Long term (current) use of anticoagulants: Secondary | ICD-10-CM | POA: Diagnosis not present

## 2022-05-16 DIAGNOSIS — I248 Other forms of acute ischemic heart disease: Secondary | ICD-10-CM | POA: Diagnosis present

## 2022-05-16 DIAGNOSIS — Z9981 Dependence on supplemental oxygen: Secondary | ICD-10-CM

## 2022-05-16 DIAGNOSIS — I10 Essential (primary) hypertension: Secondary | ICD-10-CM | POA: Diagnosis present

## 2022-05-16 DIAGNOSIS — M069 Rheumatoid arthritis, unspecified: Secondary | ICD-10-CM | POA: Diagnosis present

## 2022-05-16 DIAGNOSIS — I252 Old myocardial infarction: Secondary | ICD-10-CM

## 2022-05-16 DIAGNOSIS — J441 Chronic obstructive pulmonary disease with (acute) exacerbation: Secondary | ICD-10-CM | POA: Diagnosis present

## 2022-05-16 DIAGNOSIS — Z885 Allergy status to narcotic agent status: Secondary | ICD-10-CM

## 2022-05-16 DIAGNOSIS — E876 Hypokalemia: Secondary | ICD-10-CM | POA: Diagnosis present

## 2022-05-16 DIAGNOSIS — J439 Emphysema, unspecified: Principal | ICD-10-CM | POA: Diagnosis present

## 2022-05-16 DIAGNOSIS — Z888 Allergy status to other drugs, medicaments and biological substances status: Secondary | ICD-10-CM | POA: Diagnosis not present

## 2022-05-16 DIAGNOSIS — E119 Type 2 diabetes mellitus without complications: Secondary | ICD-10-CM | POA: Diagnosis present

## 2022-05-16 DIAGNOSIS — R079 Chest pain, unspecified: Secondary | ICD-10-CM

## 2022-05-16 DIAGNOSIS — I5032 Chronic diastolic (congestive) heart failure: Secondary | ICD-10-CM | POA: Diagnosis present

## 2022-05-16 DIAGNOSIS — I110 Hypertensive heart disease with heart failure: Secondary | ICD-10-CM | POA: Diagnosis present

## 2022-05-16 DIAGNOSIS — E669 Obesity, unspecified: Secondary | ICD-10-CM | POA: Diagnosis present

## 2022-05-16 DIAGNOSIS — E785 Hyperlipidemia, unspecified: Secondary | ICD-10-CM | POA: Diagnosis present

## 2022-05-16 DIAGNOSIS — Z881 Allergy status to other antibiotic agents status: Secondary | ICD-10-CM

## 2022-05-16 DIAGNOSIS — Z823 Family history of stroke: Secondary | ICD-10-CM

## 2022-05-16 DIAGNOSIS — Z8249 Family history of ischemic heart disease and other diseases of the circulatory system: Secondary | ICD-10-CM

## 2022-05-16 DIAGNOSIS — R778 Other specified abnormalities of plasma proteins: Secondary | ICD-10-CM | POA: Diagnosis present

## 2022-05-16 DIAGNOSIS — I2510 Atherosclerotic heart disease of native coronary artery without angina pectoris: Secondary | ICD-10-CM | POA: Diagnosis present

## 2022-05-16 DIAGNOSIS — J9611 Chronic respiratory failure with hypoxia: Secondary | ICD-10-CM | POA: Diagnosis present

## 2022-05-16 DIAGNOSIS — Z9049 Acquired absence of other specified parts of digestive tract: Secondary | ICD-10-CM

## 2022-05-16 DIAGNOSIS — I4891 Unspecified atrial fibrillation: Secondary | ICD-10-CM

## 2022-05-16 DIAGNOSIS — I480 Paroxysmal atrial fibrillation: Secondary | ICD-10-CM | POA: Diagnosis present

## 2022-05-16 DIAGNOSIS — Z6831 Body mass index (BMI) 31.0-31.9, adult: Secondary | ICD-10-CM

## 2022-05-16 DIAGNOSIS — Z7951 Long term (current) use of inhaled steroids: Secondary | ICD-10-CM

## 2022-05-16 DIAGNOSIS — Z87891 Personal history of nicotine dependence: Secondary | ICD-10-CM

## 2022-05-16 DIAGNOSIS — Z7982 Long term (current) use of aspirin: Secondary | ICD-10-CM

## 2022-05-16 LAB — COMPREHENSIVE METABOLIC PANEL
ALT: 18 U/L (ref 0–44)
AST: 15 U/L (ref 15–41)
Albumin: 3.6 g/dL (ref 3.5–5.0)
Alkaline Phosphatase: 67 U/L (ref 38–126)
Anion gap: 10 (ref 5–15)
BUN: 13 mg/dL (ref 8–23)
CO2: 28 mmol/L (ref 22–32)
Calcium: 8.6 mg/dL — ABNORMAL LOW (ref 8.9–10.3)
Chloride: 98 mmol/L (ref 98–111)
Creatinine, Ser: 0.72 mg/dL (ref 0.61–1.24)
GFR, Estimated: >60 mL/min (ref >60)
Glucose, Bld: 144 mg/dL — ABNORMAL HIGH (ref 70–99)
Potassium: 3.3 mmol/L — ABNORMAL LOW (ref 3.5–5.1)
Sodium: 136 mmol/L (ref 135–145)
Total Bilirubin: 1 mg/dL (ref 0.3–1.2)
Total Protein: 6.6 g/dL (ref 6.5–8.1)

## 2022-05-16 LAB — CBC WITH DIFFERENTIAL/PLATELET
Abs Immature Granulocytes: 0.11 10*3/uL — ABNORMAL HIGH (ref 0.00–0.07)
Basophils Absolute: 0 10*3/uL (ref 0.0–0.1)
Basophils Relative: 0 %
Eosinophils Absolute: 0.1 10*3/uL (ref 0.0–0.5)
Eosinophils Relative: 1 %
HCT: 42 % (ref 39.0–52.0)
Hemoglobin: 13.2 g/dL (ref 13.0–17.0)
Immature Granulocytes: 1 %
Lymphocytes Relative: 11 %
Lymphs Abs: 1.8 10*3/uL (ref 0.7–4.0)
MCH: 28.8 pg (ref 26.0–34.0)
MCHC: 31.4 g/dL (ref 30.0–36.0)
MCV: 91.5 fL (ref 80.0–100.0)
Monocytes Absolute: 0.7 10*3/uL (ref 0.1–1.0)
Monocytes Relative: 4 %
Neutro Abs: 13.1 10*3/uL — ABNORMAL HIGH (ref 1.7–7.7)
Neutrophils Relative %: 83 %
Platelets: 234 10*3/uL (ref 150–400)
RBC: 4.59 MIL/uL (ref 4.22–5.81)
RDW: 17.2 % — ABNORMAL HIGH (ref 11.5–15.5)
WBC: 15.8 10*3/uL — ABNORMAL HIGH (ref 4.0–10.5)
nRBC: 0 % (ref 0.0–0.2)

## 2022-05-16 LAB — TSH: TSH: 1.203 u[IU]/mL (ref 0.350–4.500)

## 2022-05-16 LAB — RESP PANEL BY RT-PCR (FLU A&B, COVID) ARPGX2
Influenza A by PCR: NEGATIVE
Influenza B by PCR: NEGATIVE
SARS Coronavirus 2 by RT PCR: NEGATIVE

## 2022-05-16 LAB — GLUCOSE, CAPILLARY: Glucose-Capillary: 180 mg/dL — ABNORMAL HIGH (ref 70–99)

## 2022-05-16 LAB — BRAIN NATRIURETIC PEPTIDE: B Natriuretic Peptide: 209.8 pg/mL — ABNORMAL HIGH (ref 0.0–100.0)

## 2022-05-16 LAB — TROPONIN I (HIGH SENSITIVITY)
Troponin I (High Sensitivity): 23 ng/L — ABNORMAL HIGH (ref <18)
Troponin I (High Sensitivity): 42 ng/L — ABNORMAL HIGH (ref <18)

## 2022-05-16 LAB — LACTIC ACID, PLASMA: Lactic Acid, Venous: 1.3 mmol/L (ref 0.5–1.9)

## 2022-05-16 LAB — MAGNESIUM: Magnesium: 1.7 mg/dL (ref 1.7–2.4)

## 2022-05-16 LAB — CBG MONITORING, ED: Glucose-Capillary: 211 mg/dL — ABNORMAL HIGH (ref 70–99)

## 2022-05-16 MED ORDER — MOMETASONE FURO-FORMOTEROL FUM 200-5 MCG/ACT IN AERO
2.0000 | INHALATION_SPRAY | Freq: Two times a day (BID) | RESPIRATORY_TRACT | Status: DC
  Administered 2022-05-16 – 2022-05-18 (×4): 2 via RESPIRATORY_TRACT
  Filled 2022-05-16: qty 8.8

## 2022-05-16 MED ORDER — PREDNISONE 20 MG PO TABS
40.0000 mg | ORAL_TABLET | Freq: Every day | ORAL | Status: DC
  Administered 2022-05-18: 40 mg via ORAL
  Filled 2022-05-16: qty 2

## 2022-05-16 MED ORDER — DILTIAZEM HCL ER COATED BEADS 240 MG PO CP24
240.0000 mg | ORAL_CAPSULE | Freq: Every day | ORAL | Status: DC
Start: 2022-05-16 — End: 2022-05-16
  Filled 2022-05-16: qty 1

## 2022-05-16 MED ORDER — SENNA 8.6 MG PO TABS: 1.0000 | ORAL_TABLET | Freq: Every day | ORAL | Status: DC | PRN

## 2022-05-16 MED ORDER — METOPROLOL TARTRATE 50 MG PO TABS
50.0000 mg | ORAL_TABLET | Freq: Two times a day (BID) | ORAL | Status: DC
  Administered 2022-05-16 – 2022-05-18 (×5): 50 mg via ORAL
  Filled 2022-05-16 (×5): qty 1

## 2022-05-16 MED ORDER — ONDANSETRON HCL 4 MG/2ML IJ SOLN: 4.0000 mg | Freq: Four times a day (QID) | INTRAMUSCULAR | Status: DC | PRN

## 2022-05-16 MED ORDER — ONDANSETRON HCL 4 MG PO TABS: 4.0000 mg | ORAL_TABLET | Freq: Four times a day (QID) | ORAL | Status: DC | PRN

## 2022-05-16 MED ORDER — POTASSIUM CHLORIDE CRYS ER 20 MEQ PO TBCR
40.0000 meq | EXTENDED_RELEASE_TABLET | Freq: Every day | ORAL | Status: DC
Start: 2022-05-16 — End: 2022-05-16

## 2022-05-16 MED ORDER — METHOTREXATE 2.5 MG PO TABS: 10.0000 mg | ORAL_TABLET | ORAL | Status: DC

## 2022-05-16 MED ORDER — ALBUTEROL SULFATE (2.5 MG/3ML) 0.083% IN NEBU
2.5000 mg | INHALATION_SOLUTION | Freq: Once | RESPIRATORY_TRACT | Status: AC
  Administered 2022-05-16: 2.5 mg via RESPIRATORY_TRACT
  Filled 2022-05-16: qty 3

## 2022-05-16 MED ORDER — POTASSIUM CHLORIDE CRYS ER 20 MEQ PO TBCR
40.0000 meq | EXTENDED_RELEASE_TABLET | Freq: Once | ORAL | Status: AC
  Administered 2022-05-16: 40 meq via ORAL
  Filled 2022-05-16: qty 2

## 2022-05-16 MED ORDER — POTASSIUM CHLORIDE CRYS ER 20 MEQ PO TBCR
40.0000 meq | EXTENDED_RELEASE_TABLET | Freq: Every day | ORAL | Status: DC
  Administered 2022-05-17 – 2022-05-18 (×2): 40 meq via ORAL
  Filled 2022-05-16 (×2): qty 2

## 2022-05-16 MED ORDER — ISOSORBIDE MONONITRATE ER 60 MG PO TB24
60.0000 mg | ORAL_TABLET | Freq: Every day | ORAL | Status: DC
  Administered 2022-05-17 – 2022-05-18 (×2): 60 mg via ORAL
  Filled 2022-05-16 (×2): qty 1

## 2022-05-16 MED ORDER — LIDOCAINE 5 % EX PTCH
1.0000 | MEDICATED_PATCH | CUTANEOUS | Status: DC
  Administered 2022-05-16 – 2022-05-17 (×2): 1 via TRANSDERMAL
  Filled 2022-05-16 (×3): qty 1

## 2022-05-16 MED ORDER — LOSARTAN POTASSIUM 25 MG PO TABS
25.0000 mg | ORAL_TABLET | Freq: Two times a day (BID) | ORAL | Status: DC
  Administered 2022-05-17 – 2022-05-18 (×3): 25 mg via ORAL
  Filled 2022-05-16 (×3): qty 1

## 2022-05-16 MED ORDER — METHYLPREDNISOLONE SODIUM SUCC 125 MG IJ SOLR
80.0000 mg | Freq: Every day | INTRAMUSCULAR | Status: AC
  Administered 2022-05-17: 80 mg via INTRAVENOUS
  Filled 2022-05-16: qty 2

## 2022-05-16 MED ORDER — DABIGATRAN ETEXILATE MESYLATE 150 MG PO CAPS
150.0000 mg | ORAL_CAPSULE | Freq: Two times a day (BID) | ORAL | Status: DC
  Administered 2022-05-16 – 2022-05-18 (×5): 150 mg via ORAL
  Filled 2022-05-16 (×6): qty 1

## 2022-05-16 MED ORDER — KETOROLAC TROMETHAMINE 15 MG/ML IJ SOLN
15.0000 mg | Freq: Once | INTRAMUSCULAR | Status: AC
  Administered 2022-05-16: 15 mg via INTRAVENOUS
  Filled 2022-05-16: qty 1

## 2022-05-16 MED ORDER — DILTIAZEM HCL 25 MG/5ML IV SOLN
10.0000 mg | Freq: Once | INTRAVENOUS | Status: AC
Start: 2022-05-16 — End: 2022-05-16
  Administered 2022-05-16: 10 mg via INTRAVENOUS
  Filled 2022-05-16: qty 5

## 2022-05-16 MED ORDER — HYDROXYZINE HCL 25 MG PO TABS
25.0000 mg | ORAL_TABLET | Freq: Four times a day (QID) | ORAL | Status: DC | PRN
  Administered 2022-05-18: 25 mg via ORAL
  Filled 2022-05-16: qty 1

## 2022-05-16 MED ORDER — METHYLPREDNISOLONE SODIUM SUCC 125 MG IJ SOLR
125.0000 mg | Freq: Once | INTRAMUSCULAR | Status: AC
  Administered 2022-05-16: 125 mg via INTRAVENOUS
  Filled 2022-05-16: qty 2

## 2022-05-16 MED ORDER — KETOROLAC TROMETHAMINE 15 MG/ML IJ SOLN
15.0000 mg | Freq: Once | INTRAMUSCULAR | Status: AC
Start: 2022-05-16 — End: 2022-05-16
  Administered 2022-05-16: 15 mg via INTRAVENOUS
  Filled 2022-05-16: qty 1

## 2022-05-16 MED ORDER — SALINE SPRAY 0.65 % NA SOLN
1.0000 | Freq: Four times a day (QID) | NASAL | Status: DC | PRN
Start: 2022-05-16 — End: 2022-05-18

## 2022-05-16 MED ORDER — ACETAMINOPHEN 325 MG PO TABS
650.0000 mg | ORAL_TABLET | Freq: Four times a day (QID) | ORAL | Status: DC | PRN
  Administered 2022-05-17 (×2): 650 mg via ORAL
  Filled 2022-05-16 (×2): qty 2

## 2022-05-16 MED ORDER — SODIUM CHLORIDE 0.9 % IV BOLUS
500.0000 mL | Freq: Once | INTRAVENOUS | Status: AC
Start: 2022-05-16 — End: 2022-05-16
  Administered 2022-05-16: 500 mL via INTRAVENOUS

## 2022-05-16 MED ORDER — GABAPENTIN 300 MG PO CAPS
300.0000 mg | ORAL_CAPSULE | Freq: Three times a day (TID) | ORAL | Status: DC | PRN
  Administered 2022-05-16 – 2022-05-17 (×2): 300 mg via ORAL
  Filled 2022-05-16 (×2): qty 1

## 2022-05-16 MED ORDER — DILTIAZEM HCL-DEXTROSE 125-5 MG/125ML-% IV SOLN (PREMIX)
5.0000 mg/h | INTRAVENOUS | Status: DC
  Administered 2022-05-16: 5 mg/h via INTRAVENOUS
  Filled 2022-05-16: qty 125

## 2022-05-16 MED ORDER — ACETAMINOPHEN 325 MG PO TABS
650.0000 mg | ORAL_TABLET | Freq: Once | ORAL | Status: AC
  Administered 2022-05-16: 650 mg via ORAL
  Filled 2022-05-16: qty 2

## 2022-05-16 MED ORDER — MELATONIN 5 MG PO TABS
5.0000 mg | ORAL_TABLET | Freq: Every day | ORAL | Status: DC
  Administered 2022-05-16 – 2022-05-17 (×2): 5 mg via ORAL
  Filled 2022-05-16 (×2): qty 1

## 2022-05-16 MED ORDER — SODIUM CHLORIDE 0.9 % IV SOLN
1.0000 g | Freq: Every day | INTRAVENOUS | Status: DC
  Administered 2022-05-16 – 2022-05-18 (×3): 1 g via INTRAVENOUS
  Filled 2022-05-16 (×2): qty 10
  Filled 2022-05-16: qty 1

## 2022-05-16 MED ORDER — ASPIRIN 81 MG PO TBEC
81.0000 mg | DELAYED_RELEASE_TABLET | Freq: Every day | ORAL | Status: DC
  Administered 2022-05-17 – 2022-05-18 (×2): 81 mg via ORAL
  Filled 2022-05-16 (×2): qty 1

## 2022-05-16 MED ORDER — ATORVASTATIN CALCIUM 80 MG PO TABS
80.0000 mg | ORAL_TABLET | Freq: Every day | ORAL | Status: DC
  Administered 2022-05-16 – 2022-05-17 (×2): 80 mg via ORAL
  Filled 2022-05-16 (×2): qty 1

## 2022-05-16 MED ORDER — ONDANSETRON HCL 4 MG/2ML IJ SOLN
4.0000 mg | Freq: Once | INTRAMUSCULAR | Status: AC
  Administered 2022-05-16: 4 mg via INTRAVENOUS
  Filled 2022-05-16: qty 2

## 2022-05-16 MED ORDER — BUSPIRONE HCL 10 MG PO TABS
10.0000 mg | ORAL_TABLET | Freq: Two times a day (BID) | ORAL | Status: DC
  Administered 2022-05-16 – 2022-05-18 (×4): 10 mg via ORAL
  Filled 2022-05-16 (×4): qty 1

## 2022-05-16 MED ORDER — ALBUTEROL SULFATE (2.5 MG/3ML) 0.083% IN NEBU: 2.5000 mg | INHALATION_SOLUTION | RESPIRATORY_TRACT | Status: DC | PRN

## 2022-05-16 MED ORDER — FOLIC ACID 1 MG PO TABS
1.0000 mg | ORAL_TABLET | Freq: Every day | ORAL | Status: DC
  Administered 2022-05-16 – 2022-05-18 (×3): 1 mg via ORAL
  Filled 2022-05-16 (×3): qty 1

## 2022-05-16 MED ORDER — INSULIN ASPART 100 UNIT/ML IJ SOLN
0.0000 [IU] | Freq: Three times a day (TID) | INTRAMUSCULAR | Status: DC
  Administered 2022-05-16 – 2022-05-17 (×2): 5 [IU] via SUBCUTANEOUS
  Administered 2022-05-17 – 2022-05-18 (×2): 2 [IU] via SUBCUTANEOUS
  Filled 2022-05-16 (×4): qty 1

## 2022-05-16 NOTE — Assessment & Plan Note (Signed)
Supplement potassium Check magnesium levels 

## 2022-05-16 NOTE — Assessment & Plan Note (Signed)
Patient with a known history of coronary artery disease who presents for evaluation of chest pain and has mildly elevated troponin levels. Elevated troponin levels may be secondary to demand ischemia from tachycardia Continue atorvastatin, aspirin and metoprolol Trend troponin levels Obtain 2D echocardiogram to rule out regional wall motion abnormality

## 2022-05-16 NOTE — ED Triage Notes (Addendum)
Arrives from home via [facility]>  Hx MI, Angina.  Awoke this morning with c/o chest pain.  [facility] and started on [facility] and a zpack for a 'chest infection'.  324 mg ASA taken by patient PTA.  Monitor showed Afib 183. 2.5 metoprolol given IV x 2.  HR now 160.  18g LAC.  500 cc NS given by EMS PTA  Wears home oxygen 3l/ [place]

## 2022-05-16 NOTE — ED Provider Notes (Signed)
[facility] Provider Note    Event Date/Time   First MD Initiated Contact with Patient 05/16/22 0932     (approximate)   History   Chest Pain   HPI  Darius Norris is a 71 y.o. male with history of atrial fibrillation on Eliquis, CAD, COPD on 3 L oxygen, hypertension who presents with shortness of breath over the last week associated with cough and chest congestion.  He denies associated fever or chills.  The patient has had some palpitations and chest tightness over the last few days and feels he is in A-fib.  He states that last week he was seen for the symptoms at the [place] and got a Z-Pak and steroid course, but states that after a few days of improvement his symptoms are getting worse.   Physical Exam   Triage Vital Signs: ED Triage Vitals  Enc Vitals Group     BP 05/16/22 1002 128/84     Pulse Rate 05/16/22 0945 69     Resp 05/16/22 0945 17     Temp 05/16/22 1001 98.3 F (36.8 C)     Temp Source 05/16/22 1001 Oral     SpO2 05/16/22 0945 93 %     Weight 05/16/22 0931 197 lb 15.6 oz (89.8 kg)     Height 05/16/22 0931 '5\' 6"'$  (1.676 m)     Head Circumference --      Peak Flow --      Pain Score 05/16/22 0931 0     Pain Loc --      Pain Edu? --      Excl. in [place]? --     Most recent vital signs: Vitals:   05/16/22 1432 05/16/22 1445  BP: 114/75 (!) 113/95  Pulse: 71 (!) 105  Resp: 19 (!) 24  Temp:    SpO2: 98% 94%     General: Alert and oriented, relatively comfortable appearing. CV:  Good peripheral perfusion.  Normal heart sounds. Resp:  Slightly increased effort.  Diminished breath sounds bilaterally with scattered wheezing. Abd:  No distention.  Other:  Trace bilateral lower extremity edema.   ED Results / Procedures / Treatments   Labs (all labs ordered are listed, but only abnormal results are displayed) Labs Reviewed  COMPREHENSIVE METABOLIC PANEL - Abnormal; Notable for the following components:      Result Value    Potassium 3.3 (*)    Glucose, Bld 144 (*)    Calcium 8.6 (*)    All other components within normal limits  CBC WITH DIFFERENTIAL/PLATELET - Abnormal; Notable for the following components:   WBC 15.8 (*)    RDW 17.2 (*)    Neutro Abs 13.1 (*)    Abs Immature Granulocytes 0.11 (*)    All other components within normal limits  BRAIN NATRIURETIC PEPTIDE - Abnormal; Notable for the following components:   B Natriuretic Peptide 209.8 (*)    All other components within normal limits  TROPONIN I (HIGH SENSITIVITY) - Abnormal; Notable for the following components:   Troponin I (High Sensitivity) 23 (*)    All other components within normal limits  TROPONIN I (HIGH SENSITIVITY) - Abnormal; Notable for the following components:   Troponin I (High Sensitivity) 42 (*)    All other components within normal limits  RESP PANEL BY RT-PCR (FLU A&B, COVID) ARPGX2  LACTIC ACID, PLASMA  TSH  MAGNESIUM     EKG  ED ECG REPORT I, Arta Silence, the attending physician, personally  viewed and interpreted this ECG.  Date: 05/16/2022 EKG Time: 0939 Rate: 147 Rhythm: Atrial fibrillation with RVR QRS Axis: normal Intervals: normal ST/T Wave abnormalities: Repolarization abnormality Narrative Interpretation: Nonspecific abnormalities with no evidence of acute ischemia    RADIOLOGY  Chest x-ray: I independently viewed and interpreted the images; there are mild bibasilar opacities, possible atelectasis versus infiltrate  PROCEDURES:  Critical Care performed: Yes, see critical care procedure note(s)  .Critical Care Performed by: Arta Silence, MD Authorized by: Arta Silence, MD   Critical care provider statement:    Critical care time (minutes):  45   Critical care was necessary to treat or prevent imminent or life-threatening deterioration of the following conditions:  Cardiac failure   Critical care was time spent personally by me on the following activities:  Development  of treatment plan with patient or surrogate, discussions with consultants, evaluation of patient's response to treatment, examination of patient, ordering and review of laboratory studies, ordering and review of radiographic studies, ordering and performing treatments and interventions, pulse oximetry, re-evaluation of patient's condition and review of old charts   Care discussed with: admitting provider     [facility]: Medications  diltiazem (CARDIZEM) 125 mg in dextrose 5% 125 mL (1 mg/mL) infusion (15 mg/hr Intravenous Rate/Dose Change 05/16/22 1247)  potassium chloride SA (KLOR-CON M) CR tablet 40 mEq (has no administration in time range)  sodium chloride (OCEAN) 0.65 % nasal spray 1 spray (has no administration in time range)  acetaminophen (TYLENOL) tablet 650 mg (has no administration in time range)  aspirin EC tablet 81 mg (81 mg Oral Not Given 05/16/22 1631)  methotrexate (RHEUMATREX) tablet 10 mg (has no administration in time range)  atorvastatin (LIPITOR) tablet 80 mg (has no administration in time range)  isosorbide mononitrate (IMDUR) 24 hr tablet 60 mg (has no administration in time range)  losartan (COZAAR) tablet 25 mg (has no administration in time range)  metoprolol tartrate (LOPRESSOR) tablet 50 mg (50 mg Oral Given 05/16/22 1632)  busPIRone (BUSPAR) tablet 10 mg (has no administration in time range)  hydrOXYzine (ATARAX) tablet 25 mg (has no administration in time range)  senna (SENOKOT) tablet 8.6 mg (has no administration in time range)  dabigatran (PRADAXA) capsule 150 mg (150 mg Oral Given 1/61/09 6045)  folic acid (FOLVITE) tablet 1 mg (1 mg Oral Given 05/16/22 1631)  melatonin tablet 5 mg (has no administration in time range)  gabapentin (NEURONTIN) capsule 300 mg (has no administration in time range)  mometasone-formoterol (DULERA) 200-5 MCG/ACT inhaler 2 puff (has no administration in time range)  cefTRIAXone (ROCEPHIN) 1 g in sodium chloride 0.9 % 100  mL IVPB (1 g Intravenous New Bag/Given 05/16/22 1629)  methylPREDNISolone sodium succinate (SOLU-MEDROL) 125 mg/2 mL injection 80 mg (has no administration in time range)    Followed by  predniSONE (DELTASONE) tablet 40 mg (has no administration in time range)  albuterol (PROVENTIL) (2.5 MG/3ML) 0.083% nebulizer solution 2.5 mg (has no administration in time range)  ondansetron (ZOFRAN) tablet 4 mg (has no administration in time range)    Or  ondansetron (ZOFRAN) injection 4 mg (has no administration in time range)  insulin aspart (novoLOG) injection 0-15 Units (has no administration in time range)  diltiazem (CARDIZEM) injection 10 mg (10 mg Intravenous Given 05/16/22 0950)  sodium chloride 0.9 % bolus 500 mL (500 mLs Intravenous New Bag/Given 05/16/22 0948)  acetaminophen (TYLENOL) tablet 650 mg (650 mg Oral Given 05/16/22 1112)  ondansetron (ZOFRAN) injection 4 mg (4  mg Intravenous Given 05/16/22 1107)  albuterol (PROVENTIL) (2.5 MG/3ML) 0.083% nebulizer solution 2.5 mg (2.5 mg Nebulization Given 05/16/22 1115)  albuterol (PROVENTIL) (2.5 MG/3ML) 0.083% nebulizer solution 2.5 mg (2.5 mg Nebulization Given 05/16/22 1115)  ketorolac (TORADOL) 15 MG/ML injection 15 mg (15 mg Intravenous Given 05/16/22 1254)  methylPREDNISolone sodium succinate (SOLU-MEDROL) 125 mg/2 mL injection 125 mg (125 mg Intravenous Given 05/16/22 1255)  potassium chloride SA (KLOR-CON M) CR tablet 40 mEq (40 mEq Oral Given 05/16/22 1442)     IMPRESSION / MDM / ASSESSMENT AND PLAN / ED COURSE  I reviewed the triage vital signs and the nursing notes.  71 year old male with PMH as noted above presents with cough, chest congestion, shortness of breath, and palpitations.  He reports that he was started on a Z-Pak and steroid course last week after presenting with the symptoms to his doctor at the [place], but they have worsened in the last few days.  I reviewed the past medical records.  The patient was most recently admitted in  December of last year.  Per the hospitalist discharge summary of 12/06/2021 he was admitted for atrial fibrillation with RVR on a Cardizem infusion.  On exam currently he is in atrial fibrillation with RVR with otherwise normal vital signs.  O2 saturation is in the mid 90s on his normal 3 L O2.  He has some wheezing bilaterally and no significant peripheral edema.  Differential diagnosis includes, but is not limited to, COPD exacerbation, acute bronchitis, pneumonia, COVID-19 or other viral etiology, atrial fibrillation exacerbation, new onset CHF.  Patient's presentation is most consistent with acute presentation with potential threat to life or bodily function.  We will obtain a chest x-ray, lab work-up, give fluids, IV Cardizem, and reassess.  The patient is on the cardiac monitor to evaluate for evidence of arrhythmia and/or significant heart rate changes.  ----------------------------------------- 12:55 PM on 05/16/2022 -----------------------------------------  Chest x-ray shows some lower lobe opacities, equivocal for atelectasis versus infiltrate..  Lab work-up reveals a minimally elevated troponin, leukocytosis, and normal electrolytes.  The BNP is minimally elevated.  Respiratory panel is negative.    The patient had minimal response to the IV Cardizem.  I have ordered a Cardizem infusion as well as Solu-Medrol and bronchodilators.  Given the persistent symptoms the patient will need admission.  I consulted Dr. Francine Graven from the hospitalist service; based on her discussion she agrees to admit the patient.   FINAL CLINICAL IMPRESSION(S) / ED DIAGNOSES   Final diagnoses:  COPD exacerbation ([place])  Atrial fibrillation with RVR ([place])     Rx / DC Orders   ED Discharge Orders     None        Note:  This document was prepared using Dragon voice recognition software and may include unintentional dictation errors.    Arta Silence, MD 05/16/22 1650

## 2022-05-16 NOTE — Assessment & Plan Note (Signed)
Stable Continue bupropion and hydroxyzine

## 2022-05-16 NOTE — Assessment & Plan Note (Signed)
Patient has a known history of A-fib and presents in a rapid ventricular rate He is currently on a Cardizem drip Continue oral Cardizem as well as metoprolol to optimize rate control Obtain TSH level Obtain 2D echocardiogram to assess LVEF and rule out valvular pathology Continue Pradaxa as primary prophylaxis for an acute stroke

## 2022-05-16 NOTE — Assessment & Plan Note (Signed)
Continue methotrexate and folic acid

## 2022-05-16 NOTE — Assessment & Plan Note (Signed)
Blood pressure is uncontrolled Continue Cardizem, metoprolol, nitrates and losartan

## 2022-05-16 NOTE — Assessment & Plan Note (Signed)
Stable.  Continue PPI. 

## 2022-05-16 NOTE — Assessment & Plan Note (Signed)
Hold metformin Maintain consistent carbohydrate diet Glycemic control with sliding scale insulin

## 2022-05-16 NOTE — H&P (Signed)
History and Physical    Patient: Darius Norris HBZ:[id number] DOB: June 04, 1951 DOA: 05/16/2022 DOS: the patient was seen and examined on 05/16/2022 [facility]: [facility] [place]  Patient coming from: Home  Chief Complaint:  Chief Complaint  Patient presents with   Chest Pain   HPI: Darius Norris is a 71 y.o. male with medical history significant for COPD with chronic respiratory failure on 3 L of oxygen continuous, history of A-fib, coronary artery disease, hypertension and GERD who presents to the ER via EMS for evaluation of multiple symptoms. Patient states that he woke up this morning with chest tightness over the left anterior chest wall associated with nausea, palpitations and diaphoresis.  He rated his chest tightness a 5 x 10 in intensity at its worst and states that it radiated to both upper extremities.  He had similar symptoms when he had an MI several years ago. He also complains of worsening shortness of breath from his baseline associated with a cough productive of yellowish-green phlegm and chest congestion.  He also complains of sinus drainage.  He has had chills but denies having any fevers, he denies having any sick contacts.  He complains of a frontal headache but denies having any visual changes. He denies having any urinary symptoms, no changes in his bowel habits, no emesis, no leg swelling, no blurred vision or focal deficit. He was seen at the [place] last week for similar presentation and was discharged on a Z-Pak and a steroid taper which he has completed. He states that he checked his vital signs and noted that he was tachycardic with heart rate in the 150s and so he called EMS.    Review of Systems: As mentioned in the history of present illness. All other systems reviewed and are negative. Past Medical History:  Diagnosis Date   A-fib [facility])    CAD (coronary artery disease)    Colitis    COPD (chronic obstructive pulmonary disease) (HCC)    Emphysema lung (HCC)     GERD (gastroesophageal reflux disease)    Hypertension    MI (myocardial infarction) ([place]) 1999   Past Surgical History:  Procedure Laterality Date   ABDOMINAL HERNIA REPAIR Bilateral    CHOLECYSTECTOMY     COLONOSCOPY WITH PROPOFOL N/A 02/01/2018   Procedure: COLONOSCOPY WITH PROPOFOL;  Surgeon: Jonathon Bellows, MD;  Location: [facility] ENDOSCOPY;  Service: Gastroenterology;  Laterality: N/A;   KNEE ARTHROSCOPY Bilateral    Social History:  reports that he quit smoking about 20 years ago. His smoking use included cigarettes. He has a 30.00 pack-year smoking history. He has never used smokeless tobacco. He reports that he does not drink alcohol and does not use drugs.  Allergies  Allergen Reactions   Ranitidine Hcl Shortness Of Breath   Zantac [Ranitidine Hcl] Shortness Of Breath   Codeine Itching    Other reaction(s): PRURITIS   Erythromycin Hives    Other reaction(s): SWELLING (NON-SPECIFIC), HIVES, SWELLING (NON-SPECIFIC), HIVES   Hydrocodone     Other reaction(s): PRURITIS   Morphine Itching   Morphine And Related Itching   Tiotropium Bromide Monohydrate    Hydrocodone-Acetaminophen Rash   Vicodin [Hydrocodone-Acetaminophen] Rash   Zantac [Ranitidine] Rash    Other reaction(s): Eruption, HIVES, SHORTNESS OF BREATH, Eruption, HIVES, SHORTNESS OF BREATH    Family History  Problem Relation Age of Onset   CAD Father    Stroke Father    CAD Sister    Stroke Brother     Prior to  Admission medications   Medication Sig Start Date End Date Taking? Authorizing Provider  acetaminophen (TYLENOL) 325 MG tablet Take 650 mg by mouth every 6 (six) hours as needed.   Yes [provider]  albuterol (VENTOLIN HFA) 108 (90 Base) MCG/ACT inhaler Inhale into the lungs. 03/30/21  Yes [provider]  aspirin EC 81 MG EC tablet Take 1 tablet (81 mg total) by mouth daily. 06/09/18  Yes [name], Avel Peace, MD  atorvastatin (LIPITOR) 80 MG tablet Take 1 tablet (80 mg total) by mouth at  bedtime. 11/18/17  Yes Vaughan Basta, MD  budesonide-formoterol [facility]) 160-4.5 MCG/ACT inhaler Inhale 2 puffs into the lungs 2 (two) times daily.   Yes [provider]  busPIRone (BUSPAR) 10 MG tablet Take by mouth.   Yes [provider]  clobetasol (TEMOVATE) 0.05 % external solution Apply topically. 02/28/21  Yes [provider]  dabigatran (PRADAXA) 150 MG CAPS capsule Take 150 mg by mouth 2 (two) times daily.   Yes [provider]  diltiazem (CARDIZEM CD) 180 MG 24 hr capsule Take 1 capsule (180 mg total) by mouth daily. Patient taking differently: Take 240 mg by mouth daily. 06/09/18  Yes [name], Avel Peace, MD  folic acid (FOLVITE) 1 MG tablet Take by mouth. 03/30/21  Yes [provider]  gabapentin (NEURONTIN) 300 MG capsule Take 1 capsule by mouth 3 (three) times daily as needed. 10/10/21  Yes [provider]  hydrocortisone 2.5 % cream Apply topically. 02/28/21  Yes [provider]  hydrOXYzine (ATARAX/VISTARIL) 25 MG tablet Take by mouth.   Yes [provider]  Ipratropium-Albuterol (COMBIVENT) 20-100 MCG/ACT AERS respimat Inhale 1 puff into the lungs every 6 (six) hours as needed for wheezing or shortness of breath.   Yes [provider]  isosorbide mononitrate (IMDUR) 60 MG 24 hr tablet Take 60 mg by mouth daily.   Yes [provider]  ketorolac (ACULAR) 0.5 % ophthalmic solution INSTILL 1 DROP OPERATIVE EYE FOUR TIMES A DAY FOR POSTOPERATIVE PAIN/INFLAMMATION SEND TO 4B AMB SURGERY OR ON DAY OF SURGERY 04/05/22 03/29/22  Yes [provider]  lidocaine (XYLOCAINE) 5 % ointment Apply topically. 03/30/21  Yes [provider]  loratadine (CLARITIN) 10 MG tablet Take 10 mg by mouth daily as needed for allergies.    Yes [provider]  losartan (COZAAR) 25 MG tablet Take 1 tablet by mouth 2 (two) times daily. 03/04/22  Yes [provider]  Melatonin 3 MG CAPS Take  2 capsules by mouth at bedtime. 10/10/21  Yes [provider]  metFORMIN (GLUCOPHAGE-XR) 500 MG 24 hr tablet Take 500 mg by mouth in the morning and at bedtime. 03/30/21  Yes [provider]  methotrexate (RHEUMATREX) 2.5 MG tablet Take 10 mg by mouth once a week. 03/02/21  Yes [provider]  metoprolol tartrate (LOPRESSOR) 50 MG tablet Take by mouth. 02/14/19  Yes [provider]  mometasone [facility]) 220 MCG/INH inhaler Take by mouth. 03/10/21  Yes [provider]  moxifloxacin (VIGAMOX) 0.5 % ophthalmic solution INSTILL 1 DROP IN LEFT EYE FOUR TIMES A DAY FOR CATARACT SURGERY SEND TO 4B AMB SURGERY OR ON DAY OF SURGERY 04/05/22 03/29/22  Yes [provider]  senna (SENOKOT) 8.6 MG TABS tablet Take 1 tablet by mouth daily as needed for mild constipation.   Yes [provider]  simethicone (MYLICON) 80 MG chewable tablet CHEW ONE TABLET BY MOUTH THREE TIMES A DAY AFTER MEALS AS NEEDED FOR GAS  03/04/22  Yes [provider]  guaifenesin (HUMIBID E) 400 MG TABS tablet Take by mouth. 03/30/21   [provider]  metoprolol succinate (TOPROL-XL) 100 MG 24 hr tablet TAKE ONE-HALF TABLET BY MOUTH TWO TIMES A DAY Patient not taking: Reported on 05/16/2022 03/04/22   [provider]  omeprazole (PRILOSEC) 20 MG capsule Take 20 mg by mouth daily.  Patient not taking: Reported on 05/08/2022    [provider]  urea (CARMOL) 20 % cream Apply topically. Patient not taking: Reported on 05/08/2022 11/29/20   [provider]    Physical Exam: Vitals:   05/16/22 1245 05/16/22 1300 05/16/22 1315 05/16/22 1330  BP: (!) 141/89 102/70 (!) 150/136 122/76  Pulse: (!) 151 (!) 128 (!) 160 (!) 39  Resp: 14 (!) 24 (!) 25 20  Temp:      TempSrc:      SpO2: 93% 93% 94% 93%  Weight:      Height:       Physical Exam Vitals and nursing note reviewed.  Constitutional:      Appearance: He is well-developed.     Comments:  Patient sitting up in a chair eating lunch.  Noted to have conversational dyspnea and is tachycardic on the monitor  HENT:     Head: Normocephalic and atraumatic.  Eyes:     Pupils: Pupils are equal, round, and reactive to light.  Cardiovascular:     Rate and Rhythm: Tachycardia present. Rhythm irregular.     Heart sounds: Normal heart sounds.  Pulmonary:     Effort: Tachypnea present.     Breath sounds: Examination of the right-lower field reveals wheezing and rales. Examination of the left-lower field reveals wheezing and rales. Wheezing and rales present.  Abdominal:     General: Bowel sounds are normal.     Palpations: Abdomen is soft.     Tenderness: There is abdominal tenderness.     Comments: Periumbilical tenderness  Musculoskeletal:        General: Normal range of motion.     Cervical back: Normal range of motion and neck supple.  Skin:    General: Skin is warm and dry.  Neurological:     General: No focal deficit present.     Mental Status: He is alert.  Psychiatric:        Mood and Affect: Mood normal.        Behavior: Behavior normal.    Data Reviewed: Relevant notes from primary care and specialist visits, past discharge summaries as available in EHR, including Care Everywhere. Prior diagnostic testing as pertinent to current admission diagnoses Updated medications and problem lists for reconciliation ED course, including vitals, labs, imaging, treatment and response to treatment Triage notes, nursing and pharmacy notes and ED provider's notes Notable results as noted in HPI Labs reviewed.  Sodium 136, potassium 3.3, chloride 98, bicarb 28, glucose 144, BUN 13, creatinine 0.72, calcium 8.6, total protein 6.6, albumin 3.6, AST 15, ALT 18, troponin 23 >> 42, BNP 209, white count 15.8, hemoglobin 13.2, hematocrit 42.0, platelet count 234 Respiratory viral panel is negative Chest x-ray reviewed by me shows Mild bibasilar subsegmental atelectasis or infiltrates are  noted. Emphysema  Twelve-lead EKG reviewed by me shows rapid A-fib There are no new results to review at this time.  Assessment and Plan: * Atrial fibrillation with rapid ventricular response (HCC) Patient has a known history of A-fib and presents in a rapid ventricular rate He is currently on a Cardizem drip  Continue oral Cardizem as well as metoprolol to optimize rate control Obtain TSH level Obtain 2D echocardiogram to assess LVEF and rule out valvular pathology Continue Pradaxa as primary prophylaxis for an acute stroke  Chest pain Patient with a known history of coronary artery disease who presents for evaluation of chest pain and has mildly elevated troponin levels. Elevated troponin levels may be secondary to demand ischemia from tachycardia Continue atorvastatin, aspirin and metoprolol Trend troponin levels Obtain 2D echocardiogram to rule out regional wall motion abnormality  COPD with acute exacerbation ([place]) Patient with a history of COPD and chronic respiratory failure who presents to the ER for evaluation of worsening shortness of breath, wheezing and cough productive of yellow phlegm Start patient on IV Rocephin 1 g daily Place patient on scheduled and as needed bronchodilator therapy Continue inhaled steroids   Diabetes mellitus without complication (HCC) Hold metformin Maintain consistent carbohydrate diet Glycemic control with sliding scale insulin  HTN (hypertension) Blood pressure is uncontrolled Continue Cardizem, metoprolol, nitrates and losartan  Depression with anxiety Stable Continue bupropion and hydroxyzine  GERD (gastroesophageal reflux disease) Stable  Continue PPI  Hypokalemia Supplement potassium Check magnesium levels  Rheumatoid arthritis (HCC) Continue methotrexate and folic acid      Advance Care Planning:   Code Status: Full Code   Consults: Cardiology  Family Communication: Greater than 50% of time was spent discussing  patient's condition and plan of care with him at the bedside.  All questions and concerns have been addressed.  He verbalizes understanding and agrees with the plan.  Severity of Illness: The appropriate patient status for this patient is INPATIENT. Inpatient status is judged to be reasonable and necessary in order to provide the required intensity of service to ensure the patient's safety. The patient's presenting symptoms, physical exam findings, and initial radiographic and laboratory data in the context of their chronic comorbidities is felt to place them at high risk for further clinical deterioration. Furthermore, it is not anticipated that the patient will be medically stable for discharge from the hospital within 2 midnights of admission.   * I certify that at the point of admission it is my clinical judgment that the patient will require inpatient hospital care spanning beyond 2 midnights from the point of admission due to high intensity of service, high risk for further deterioration and high frequency of surveillance required.*  Author: Collier Bullock, MD 05/16/2022 1:58 PM  For on call review [email or web address].

## 2022-05-16 NOTE — Consult Note (Signed)
[place] NOTE       Patient ID: Darius Norris MRN: [id number] DOB/AGE: Nov 12, 1951 71 y.o.  Admit date: 05/16/2022 Referring Physician Dr. Francine Graven Primary Physician  Primary Cardiologist Dr. 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  He was recently seen at the [place] last week for similar symptoms and has completed a Z-Pak and steroid taper without much relief.  He was found to be in atrial fibrillation with RVR on admission, cardiology is consulted for further assistance.  The patient presents with his wife who contributes to the history.  He states last week he thought he had an upper respiratory infection with a rattling in his throat, generalized weakness, and cough over the past week.  He was given a Z-Pak and steroid taper at the [place] without much relief, and remained short of breath having to increase his oxygen from 3 L to 4.  He woke up this morning and "as soon as his feet hit the floor" he started having chest tightness that radiated to both of his arms, heart racing, nausea, and one episode of diarrhea -he notes this was similar to his heart attack many years ago.  He checked his heart rate and noted it to be increased.  He was given a dose of IV diltiazem x1 and was started on a diltiazem drip.  He reports compliance with his medications including his Pradaxa for anticoagulation.  He has had some increased lower extremity edema that resolves with elevation of his lower legs.  At my time of evaluation he is sitting upright in a recliner, feeling somewhat better  than when he came in after getting some Toradol.  He denies chest discomfort or significant heart racing.  He still notes the rattling in his throat and cough.  Denies orthopnea, dizziness.   Vitals are notable for a blood pressure of 113/95, heart rate on telemetry in the 120s-140s during interview, rhythm is atrial fibrillation.   Labs are notable for potassium of 3.3, BUN/creatinine 13/0.72, GFR greater than 60.  BNP slightly elevated at 209.8, high-sensitivity troponin minimally elevated and flat trending at 19-23-42.  Respiratory panel negative for influenza or COVID.  Chest x-ray with mild bibasilar subsegmental atelectasis or infiltrates with emphysematous changes   Review of systems complete and found to be negative unless listed above     Past Medical History:  Diagnosis Date   A-fib ([place])    CAD (coronary artery disease)    Colitis    COPD (chronic obstructive pulmonary disease) (HCC)    Emphysema lung (HCC)    GERD (gastroesophageal reflux disease)    Hypertension    MI (myocardial infarction) ([place]) 1999    Past Surgical History:  Procedure Laterality Date   ABDOMINAL HERNIA REPAIR Bilateral    CHOLECYSTECTOMY     COLONOSCOPY WITH PROPOFOL N/A 02/01/2018   Procedure: COLONOSCOPY WITH PROPOFOL;  Surgeon: Jonathon Bellows, MD;  Location: [facility] ENDOSCOPY;  Service: Gastroenterology;  Laterality: N/A;   KNEE ARTHROSCOPY Bilateral     (Not in a hospital admission)  Social History   Socioeconomic History   Marital status: Married  Spouse name: Not on file   Number of children: Not on file   Years of education: Not on file   Highest education level: Not on file  Occupational History   Not on file  Tobacco Use   Smoking status: Former    Packs/day: 1.00    Years: 30.00    Pack years: 30.00    Types: Cigarettes    Quit date: 12/18/2001    Years since quitting: 20.4   Smokeless tobacco: Never  Vaping Use   Vaping Use: Never used  Substance and Sexual Activity   Alcohol  use: No   Drug use: No   Sexual activity: Never  Other Topics Concern   Not on file  Social History Narrative   Not on file   Social Determinants of Health   Financial Resource Strain: Not on file  Food Insecurity: Not on file  Transportation Needs: Not on file  Physical Activity: Not on file  Stress: Not on file  Social Connections: Not on file  Intimate Partner Violence: Not on file    Family History  Problem Relation Age of Onset   CAD Father    Stroke Father    CAD Sister    Stroke Brother       PHYSICAL EXAM General: Pleasant elderly and conversational Caucasian male, well nourished, in no acute distress.  Sitting upright in recliner with wife at bedside HEENT:  Normocephalic and atraumatic. Neck:  No JVD.  Lungs: Conversational dyspnea on 4 L by [place].  Decreased breath sounds bilaterally without appreciable crackles or wheezes.   Heart: Tachycardic irregularly irregular rhythm. Normal S1 and S2 without gallops or murmurs.  Abdomen: Obese appearing.  Msk: Normal strength and tone for age. Extremities: No clubbing, cyanosis.  Cap refill brisk with erythema on bilateral lower extremities without edema.  Neuro: Alert and oriented X 3. Psych:  Answers questions appropriately.   Labs:   Lab Results  Component Value Date   WBC 15.8 (H) 05/16/2022   HGB 13.2 05/16/2022   HCT 42.0 05/16/2022   MCV 91.5 05/16/2022   PLT 234 05/16/2022    Recent Labs  Lab 05/16/22 0954  NA 136  K 3.3*  CL 98  CO2 28  BUN 13  CREATININE 0.72  CALCIUM 8.6*  PROT 6.6  BILITOT 1.0  ALKPHOS 67  ALT 18  AST 15  GLUCOSE 144*   Lab Results  Component Value Date   CKTOTAL 74 02/05/2013   CKMB 0.9 02/05/2013   TROPONINI <0.03 01/05/2019   TROPONINI <0.03 01/05/2019    Lab Results  Component Value Date   CHOL 106 12/06/2021   CHOL 116 09/04/2016   CHOL 185 02/04/2013   Lab Results  Component Value Date   HDL 33 (L) 12/06/2021   HDL 42 09/04/2016   HDL 38 (L) 02/04/2013    Lab Results  Component Value Date   LDLCALC 51 12/06/2021   LDLCALC 51 09/04/2016   LDLCALC 114 (H) 02/04/2013   Lab Results  Component Value Date   TRIG 108 12/06/2021   TRIG 116 09/04/2016   TRIG 166 02/04/2013   Lab Results  Component Value Date   CHOLHDL 3.2 12/06/2021   CHOLHDL 2.8 09/04/2016   No results found for: LDLDIRECT    Radiology: [facility] Chest Port 1 View  Result Date: 05/16/2022 CLINICAL DATA:  Chest pain. EXAM: PORTABLE CHEST 1 VIEW COMPARISON:  February 22, 2022. FINDINGS: The heart size and mediastinal contours are within normal limits. Mild  bibasilar atelectasis or infiltrates are noted. Emphysematous disease is noted in the upper lobes. The visualized skeletal structures are unremarkable. IMPRESSION: Mild bibasilar subsegmental atelectasis or infiltrates are noted. Emphysema (ICD10-J43.9). Electronically Signed   By: Marijo Conception M.D.   On: 05/16/2022 10:09    ECHO ECHOCARDIOGRAPHIC DESCRIPTIONS  AORTIC ROOT                   Size: Normal             Dissection: INDETERM FOR DISSECTION  AORTIC VALVE               Leaflets: Tricuspid                   Morphology: Normal               Mobility: Fully mobile  LEFT VENTRICLE                   Size: Normal                        Anterior: Normal            Contraction: REGIONALLY IMPAIRED            Lateral: HYPOCONTRACTILE             Closest EF: 45% (Estimated)                 Septal: Normal              LV Masses: No Masses                       Apical: Normal                    LVH: None                          Inferior: Normal                                                      Posterior: HYPOCONTRACTILE           Dias.FxClass: (Grade 1) relaxation abnormal, E/A reversal  MITRAL VALVE               Leaflets: Normal                        Mobility: Fully mobile             Morphology: Normal  LEFT ATRIUM                   Size: Normal                       LA Masses: No masses              IA Septum:  Normal IAS  MAIN PA                   Size: Normal  PULMONIC VALVE             Morphology: Normal  Mobility: Fully mobile  RIGHT VENTRICLE              RV Masses: No Masses                         Size: Normal              Free Wall: Normal                     Contraction: Normal  TRICUSPID VALVE               Leaflets: Normal                        Mobility: Fully mobile             Morphology: Normal  RIGHT ATRIUM                   Size: Normal                        RA Other: None                RA Mass: No masses  PERICARDIUM                  Fluid: No effusion  INFERIOR VENACAVA                   Size: Normal Normal respiratory collapse  _________________________________________________________________________________________   DOPPLER ECHO and OTHER SPECIAL PROCEDURES                 Aortic: TRIVIAL AR                 No AS                         141.9 cm/sec peak vel      8.1 mmHg peak grad                         3.9 mmHg mean grad         2.7 cm^2 by DOPPLER                 Mitral: TRIVIAL MR                 No MS                         MV Inflow E Vel = 51.4 cm/sec     MV Annulus E'Vel = 4.9 cm/sec                         E/E'Ratio = 10.5              Tricuspid: TRIVIAL TR                 No TS                         242.4 cm/sec peak TR vel   28.5 mmHg peak RV pressure              Pulmonary: TRIVIAL PR                 No PS  _________________________________________________________________________________________  INTERPRETATION  MILD SEGMENTAL LV SYSTOLIC DYSFUNCTION WITH AN ESTIMATED EF = 45 %  NORMAL RIGHT VENTRICULAR SYSTOLIC FUNCTION  TRIVIAL REGURGITATION NOTED (See above)  NO VALVULAR STENOSIS   TELEMETRY reviewed by me: Atrial fibrillation with RVR, rates 120s-140s during interview  EKG reviewed by me: Atrial fibrillation, rate 129  ASSESSMENT AND PLAN:  Darius Norris is a 64yoM with a PMH of CAD (s/p NSTEMI 2001, heart cath 2008  with 40% mid Lcx, 100% OM2), 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 ED 05/16/2022 with multiple complaints including chest tightness, shortness of breath with productive cough, and some abdominal discomfort.  He was recently seen at the [place] last week for similar symptoms and has completed a Z-Pak and steroid taper without much relief.  He was found to be in atrial fibrillation with RVR on admission, cardiology is consulted for further assistance.  #Paroxysmal atrial fibrillation with RVR #Elevated troponin The patient presents with a 1 week history of rattling productive cough, treated for a URI with steroids and azithromycin last week without much improvement.  He noted this morning acute onset of chest discomfort with radiation to both his arms with associated heart racing, and heart rate by telemetry on admission in the 150s.  High-sensitivity troponin minimally elevated and flat trending, likely due to demand ischemia from his rapid heart rate, and not ACS.  His rapid A-fib is likely due to his underlying lung disease with acute exacerbation as below. 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 -Consider diuresis if volume status worsens.  This patient's plan of care was discussed and created with Dr. Saralyn Pilar and he is in agreement.  Signed: Tristan Schroeder , PA-C 05/16/2022, 2:40 PM [facility]

## 2022-05-16 NOTE — Assessment & Plan Note (Signed)
Patient with a history of COPD and chronic respiratory failure who presents to the ER for evaluation of worsening shortness of breath, wheezing and cough productive of yellow phlegm Start patient on IV Rocephin 1 g daily Place patient on scheduled and as needed bronchodilator therapy Continue inhaled steroids

## 2022-05-17 ENCOUNTER — Encounter: Payer: Self-pay | Admitting: Internal Medicine

## 2022-05-17 DIAGNOSIS — J441 Chronic obstructive pulmonary disease with (acute) exacerbation: Secondary | ICD-10-CM | POA: Diagnosis not present

## 2022-05-17 DIAGNOSIS — I5032 Chronic diastolic (congestive) heart failure: Secondary | ICD-10-CM | POA: Diagnosis not present

## 2022-05-17 DIAGNOSIS — I480 Paroxysmal atrial fibrillation: Secondary | ICD-10-CM | POA: Diagnosis not present

## 2022-05-17 LAB — ECHOCARDIOGRAM COMPLETE
AR max vel: 2.6 cm2
AV Area VTI: 2.2 cm2
AV Area mean vel: 2.46 cm2
AV Mean grad: 3 mmHg
AV Peak grad: 6.6 mmHg
Ao pk vel: 1.28 m/s
Area-P 1/2: 3.89 cm2
Height: 66 in
MV VTI: 2.53 cm2
S' Lateral: 2.82 cm
Weight: 3167.57 oz

## 2022-05-17 LAB — GLUCOSE, CAPILLARY
Glucose-Capillary: 139 mg/dL — ABNORMAL HIGH (ref 70–99)
Glucose-Capillary: 107 mg/dL — ABNORMAL HIGH (ref 70–99)
Glucose-Capillary: 204 mg/dL — ABNORMAL HIGH (ref 70–99)
Glucose-Capillary: 155 mg/dL — ABNORMAL HIGH (ref 70–99)

## 2022-05-17 LAB — BASIC METABOLIC PANEL
Anion gap: 8 (ref 5–15)
BUN: 18 mg/dL (ref 8–23)
CO2: 30 mmol/L (ref 22–32)
Calcium: 8.6 mg/dL — ABNORMAL LOW (ref 8.9–10.3)
Chloride: 97 mmol/L — ABNORMAL LOW (ref 98–111)
Creatinine, Ser: 0.74 mg/dL (ref 0.61–1.24)
GFR, Estimated: >60 mL/min (ref >60)
Glucose, Bld: 140 mg/dL — ABNORMAL HIGH (ref 70–99)
Potassium: 4 mmol/L (ref 3.5–5.1)
Sodium: 135 mmol/L (ref 135–145)

## 2022-05-17 LAB — CBC
HCT: 37.2 % — ABNORMAL LOW (ref 39.0–52.0)
Hemoglobin: 11.8 g/dL — ABNORMAL LOW (ref 13.0–17.0)
MCH: 28.6 pg (ref 26.0–34.0)
MCHC: 31.7 g/dL (ref 30.0–36.0)
MCV: 90.3 fL (ref 80.0–100.0)
Platelets: 209 10*3/uL (ref 150–400)
RBC: 4.12 MIL/uL — ABNORMAL LOW (ref 4.22–5.81)
RDW: 17 % — ABNORMAL HIGH (ref 11.5–15.5)
WBC: 7.7 10*3/uL (ref 4.0–10.5)
nRBC: 0 % (ref 0.0–0.2)

## 2022-05-17 MED ORDER — DILTIAZEM HCL ER COATED BEADS 120 MG PO CP24
240.0000 mg | ORAL_CAPSULE | Freq: Every day | ORAL | Status: DC
Start: 2022-05-17 — End: 2022-05-18
  Administered 2022-05-17 – 2022-05-18 (×2): 240 mg via ORAL
  Filled 2022-05-17 (×2): qty 2

## 2022-05-17 MED ORDER — LORATADINE 10 MG PO TABS
10.0000 mg | ORAL_TABLET | Freq: Every day | ORAL | Status: DC
  Administered 2022-05-17 – 2022-05-18 (×2): 10 mg via ORAL
  Filled 2022-05-17 (×2): qty 1

## 2022-05-17 MED ORDER — TRAMADOL HCL 50 MG PO TABS
50.0000 mg | ORAL_TABLET | Freq: Four times a day (QID) | ORAL | Status: DC | PRN
  Administered 2022-05-17: 50 mg via ORAL
  Filled 2022-05-17: qty 1

## 2022-05-17 MED ORDER — FUROSEMIDE 40 MG PO TABS
40.0000 mg | ORAL_TABLET | Freq: Every day | ORAL | Status: DC
  Administered 2022-05-17 – 2022-05-18 (×2): 40 mg via ORAL
  Filled 2022-05-17 (×2): qty 1

## 2022-05-17 NOTE — Progress Notes (Signed)
[place] NOTE       Patient ID: Darius Norris MRN: [id number] DOB/AGE: Mar 24, 1951 71 y.o.  Admit date: 05/16/2022 Referring Physician Dr. Francine Graven Primary Physician [facility] Primary Cardiologist Dr. 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  He was recently seen at the [place] last week for similar symptoms and has completed a Z-Pak and steroid taper without much relief.  He was found to be in atrial fibrillation with RVR on admission, cardiology is consulted for further assistance.  Interval History: -sitting on edge of bed eating breakfast -feels much better today from a breathing standpoint, HR 70s in sinus rhythm -no chest pain, back on baseline O2, cough improved   Review of systems complete and found to be negative unless listed above     Past Medical History:  Diagnosis Date   A-fib (Home)    CAD (coronary artery disease)    Colitis    COPD (chronic obstructive pulmonary disease) (HCC)    Emphysema lung (HCC)    GERD (gastroesophageal reflux disease)    Hypertension    MI (myocardial infarction) ([place]) 1999    Past Surgical History:  Procedure Laterality Date   ABDOMINAL HERNIA REPAIR Bilateral    CHOLECYSTECTOMY     COLONOSCOPY WITH PROPOFOL N/A 02/01/2018   Procedure: COLONOSCOPY WITH PROPOFOL;  Surgeon: Jonathon Bellows, MD;  Location: [facility] ENDOSCOPY;  Service: Gastroenterology;  Laterality: N/A;   KNEE ARTHROSCOPY Bilateral     Medications Prior to Admission  Medication Sig Dispense Refill Last Dose   acetaminophen (TYLENOL) 325 MG tablet Take 650  mg by mouth every 6 (six) hours as needed.   Past Week   albuterol (VENTOLIN HFA) 108 (90 Base) MCG/ACT inhaler Inhale into the lungs.   05/15/2022   aspirin EC 81 MG EC tablet Take 1 tablet (81 mg total) by mouth daily. 180 tablet 0 05/15/2022   atorvastatin (LIPITOR) 80 MG tablet Take 1 tablet (80 mg total) by mouth at bedtime. 30 tablet 0 05/15/2022   budesonide-formoterol (SYMBICORT) 160-4.5 MCG/ACT inhaler Inhale 2 puffs into the lungs 2 (two) times daily.   05/15/2022   busPIRone (BUSPAR) 10 MG tablet Take by mouth.   05/15/2022   clobetasol (TEMOVATE) 0.05 % external solution Apply topically.   Past Week   dabigatran (PRADAXA) 150 MG CAPS capsule Take 150 mg by mouth 2 (two) times daily.   05/15/2022   diltiazem (CARDIZEM CD) 180 MG 24 hr capsule Take 1 capsule (180 mg total) by mouth daily. (Patient taking differently: Take 240 mg by mouth daily.) 90 capsule 0 7/49/4496   folic acid (FOLVITE) 1 MG tablet Take by mouth.   05/15/2022   gabapentin (NEURONTIN) 300 MG capsule Take 1 capsule by mouth 3 (three) times daily as needed.   05/15/2022   hydrocortisone 2.5 % cream Apply topically.   Past Week   hydrOXYzine (ATARAX/VISTARIL) 25 MG tablet Take by mouth.   Past Week   Ipratropium-Albuterol (COMBIVENT) 20-100 MCG/ACT AERS respimat Inhale 1 puff into the lungs every 6 (six) hours as needed for wheezing or shortness of breath.   Past Week   isosorbide  mononitrate (IMDUR) 60 MG 24 hr tablet Take 60 mg by mouth daily.   05/15/2022   ketorolac (ACULAR) 0.5 % ophthalmic solution INSTILL 1 DROP OPERATIVE EYE FOUR TIMES A DAY FOR POSTOPERATIVE PAIN/INFLAMMATION SEND TO 4B AMB SURGERY OR ON DAY OF SURGERY 04/05/22   Past Week   lidocaine (XYLOCAINE) 5 % ointment Apply topically.   Past Week   loratadine (CLARITIN) 10 MG tablet Take 10 mg by mouth daily as needed for allergies.    05/15/2022   losartan (COZAAR) 25 MG tablet Take 1 tablet by mouth 2 (two) times daily.   05/15/2022   Melatonin 3 MG CAPS Take 2  capsules by mouth at bedtime.   05/15/2022   metFORMIN (GLUCOPHAGE-XR) 500 MG 24 hr tablet Take 500 mg by mouth in the morning and at bedtime.   05/15/2022   methotrexate (RHEUMATREX) 2.5 MG tablet Take 10 mg by mouth once a week.   Past Week   metoprolol tartrate (LOPRESSOR) 50 MG tablet Take by mouth.   05/15/2022   mometasone (ASMANEX) 220 MCG/INH inhaler Take by mouth.   05/15/2022   moxifloxacin (VIGAMOX) 0.5 % ophthalmic solution INSTILL 1 DROP IN LEFT EYE FOUR TIMES A DAY FOR CATARACT SURGERY SEND TO 4B AMB SURGERY OR ON DAY OF SURGERY 04/05/22   05/15/2022   senna (SENOKOT) 8.6 MG TABS tablet Take 1 tablet by mouth daily as needed for mild constipation.   Past Week   simethicone (MYLICON) 80 MG chewable tablet CHEW ONE TABLET BY MOUTH THREE TIMES A DAY AFTER MEALS AS NEEDED FOR GAS   Past Week   guaifenesin (HUMIBID E) 400 MG TABS tablet Take by mouth.      metoprolol succinate (TOPROL-XL) 100 MG 24 hr tablet TAKE ONE-HALF TABLET BY MOUTH TWO TIMES A DAY (Patient not taking: Reported on 05/16/2022)   Not Taking   omeprazole (PRILOSEC) 20 MG capsule Take 20 mg by mouth daily.  (Patient not taking: Reported on 05/08/2022)      urea (CARMOL) 20 % cream Apply topically. (Patient not taking: Reported on 05/08/2022)       Social History   Socioeconomic History   Marital status: Married    Spouse name: Not on file   Number of children: Not on file   Years of education: Not on file   Highest education level: Not on file  Occupational History   Not on file  Tobacco Use   Smoking status: Former    Packs/day: 1.00    Years: 30.00    Pack years: 30.00    Types: Cigarettes    Quit date: 12/18/2001    Years since quitting: 20.4   Smokeless tobacco: Never  Vaping Use   Vaping Use: Never used  Substance and Sexual Activity   Alcohol use: No   Drug use: No   Sexual activity: Never  Other Topics Concern   Not on file  Social History Narrative   Not on file   Social Determinants of Health    Financial Resource Strain: Not on file  Food Insecurity: Not on file  Transportation Needs: Not on file  Physical Activity: Not on file  Stress: Not on file  Social Connections: Not on file  Intimate Partner Violence: Not on file    Family History  Problem Relation Age of Onset   CAD Father    Stroke Father    CAD Sister    Stroke Brother       PHYSICAL EXAM General: Pleasant elderly  and conversational Caucasian male, well nourished, in no acute distress.  Sitting at edge of bed after finishing breakfast HEENT:  Normocephalic and atraumatic. Neck:  No JVD.  Lungs: Normal respiratory effort on 3L by [place].  Decreased breath sounds bilaterally without appreciable crackles or wheezes.   Heart: Regular rate and rhythm. Normal S1 and S2 without gallops or murmurs.  Abdomen: Obese appearing.  Msk: Normal strength and tone for age. Extremities: No clubbing, cyanosis.  Cap refill brisk with erythema on bilateral lower extremities without edema.  Neuro: Alert and oriented X 3. Psych:  Answers questions appropriately.   Labs:   Lab Results  Component Value Date   WBC 7.7 05/17/2022   HGB 11.8 (L) 05/17/2022   HCT 37.2 (L) 05/17/2022   MCV 90.3 05/17/2022   PLT 209 05/17/2022    Recent Labs  Lab 05/16/22 0954 05/17/22 0641  NA 136 135  K 3.3* 4.0  CL 98 97*  CO2 28 30  BUN 13 18  CREATININE 0.72 0.74  CALCIUM 8.6* 8.6*  PROT 6.6  --   BILITOT 1.0  --   ALKPHOS 67  --   ALT 18  --   AST 15  --   GLUCOSE 144* 140*    Lab Results  Component Value Date   CKTOTAL 74 02/05/2013   CKMB 0.9 02/05/2013   TROPONINI <0.03 01/05/2019   TROPONINI <0.03 01/05/2019     Lab Results  Component Value Date   CHOL 106 12/06/2021   CHOL 116 09/04/2016   CHOL 185 02/04/2013   Lab Results  Component Value Date   HDL 33 (L) 12/06/2021   HDL 42 09/04/2016   HDL 38 (L) 02/04/2013   Lab Results  Component Value Date   LDLCALC 51 12/06/2021   LDLCALC 51 09/04/2016    LDLCALC 114 (H) 02/04/2013   Lab Results  Component Value Date   TRIG 108 12/06/2021   TRIG 116 09/04/2016   TRIG 166 02/04/2013   Lab Results  Component Value Date   CHOLHDL 3.2 12/06/2021   CHOLHDL 2.8 09/04/2016   No results found for: LDLDIRECT    Radiology: [facility] Chest Port 1 View  Result Date: 05/16/2022 CLINICAL DATA:  Chest pain. EXAM: PORTABLE CHEST 1 VIEW COMPARISON:  February 22, 2022. FINDINGS: The heart size and mediastinal contours are within normal limits. Mild bibasilar atelectasis or infiltrates are noted. Emphysematous disease is noted in the upper lobes. The visualized skeletal structures are unremarkable. IMPRESSION: Mild bibasilar subsegmental atelectasis or infiltrates are noted. Emphysema (ICD10-J43.9). Electronically Signed   By: Marijo Conception M.D.   On: 05/16/2022 10:09    ECHO ECHOCARDIOGRAPHIC DESCRIPTIONS  AORTIC ROOT                   Size: Normal             Dissection: INDETERM FOR DISSECTION  AORTIC VALVE               Leaflets: Tricuspid                   Morphology: Normal               Mobility: Fully mobile  LEFT VENTRICLE                   Size: Normal                        Anterior: Normal  Contraction: REGIONALLY IMPAIRED            Lateral: HYPOCONTRACTILE             Closest EF: 45% (Estimated)                 Septal: Normal              LV Masses: No Masses                       Apical: Normal                    LVH: None                          Inferior: Normal                                                      Posterior: HYPOCONTRACTILE           Dias.FxClass: (Grade 1) relaxation abnormal, E/A reversal  MITRAL VALVE               Leaflets: Normal                        Mobility: Fully mobile             Morphology: Normal  LEFT ATRIUM                   Size: Normal                       LA Masses: No masses              IA Septum: Normal IAS  MAIN PA                   Size: Normal  PULMONIC VALVE             Morphology:  Normal                        Mobility: Fully mobile  RIGHT VENTRICLE              RV Masses: No Masses                         Size: Normal              Free Wall: Normal                     Contraction: Normal  TRICUSPID VALVE               Leaflets: Normal                        Mobility: Fully mobile             Morphology: Normal  RIGHT ATRIUM                   Size: Normal                        RA Other: None  RA Mass: No masses  PERICARDIUM                  Fluid: No effusion  INFERIOR VENACAVA                   Size: Normal Normal respiratory collapse  _________________________________________________________________________________________   DOPPLER ECHO and OTHER SPECIAL PROCEDURES                 Aortic: TRIVIAL AR                 No AS                         141.9 cm/sec peak vel      8.1 mmHg peak grad                         3.9 mmHg mean grad         2.7 cm^2 by DOPPLER                 Mitral: TRIVIAL MR                 No MS                         MV Inflow E Vel = 51.4 cm/sec     MV Annulus E'Vel = 4.9 cm/sec                         E/E'Ratio = 10.5              Tricuspid: TRIVIAL TR                 No TS                         242.4 cm/sec peak TR vel   28.5 mmHg peak RV pressure              Pulmonary: TRIVIAL PR                 No PS  _________________________________________________________________________________________  INTERPRETATION  MILD SEGMENTAL LV SYSTOLIC DYSFUNCTION WITH AN ESTIMATED EF = 45 %  NORMAL RIGHT VENTRICULAR SYSTOLIC FUNCTION  TRIVIAL REGURGITATION NOTED (See above)  NO VALVULAR STENOSIS   TELEMETRY reviewed by me: Atrial fibrillation with RVR, rates 120s-140s during interview  EKG reviewed by me: Atrial fibrillation, rate 129, repeat 5/31   ASSESSMENT AND PLAN:  Darius Norris is a 14yoM with a PMH of CAD (s/p NSTEMI 2001, heart cath 2008 with 40% mid Lcx, 100% OM2), 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  He was recently seen at the [place] last week for similar symptoms and has completed a Z-Pak and steroid taper without much relief.  He was found to be in atrial fibrillation with RVR on admission, cardiology is consulted for further assistance.  #Paroxysmal atrial fibrillation with RVR #Elevated troponin The patient presents with a 1 week history of rattling productive cough, treated for a URI with steroids and azithromycin last week without much improvement.  He noted this morning acute onset of chest discomfort with  radiation to both his arms with associated heart racing, and heart rate by telemetry on admission in the 150s.  High-sensitivity troponin minimally elevated and flat trending, likely due to demand ischemia from his rapid heart rate, and not ACS.  His rapid A-fib is likely due to his underlying lung disease with acute exacerbation as below. -S/p diltiazem IV x1, s/p diltiazem drip with conversion to sinus rhythm  -Resume home Cardizem CD 240 mg once daily  -Continue metoprolol tartrate 50 mg twice daily -Continue Pradaxa 150 mg BID for stroke risk reduction, CHA2DS2-VASc 3 -Repeat EKG  -Monitor and replete electrolytes -Repeat echocardiogram complete, pending read -Defer invasive cardiac diagnostics  -likely ok for discharge from a cardiac standpoint tomorrow morning if patient continues to feel well   #Acute on chronic respiratory failure (baseline O2 is 3L) Agree with current therapy for COPD exacerbation per primary team  #CAD -Continue aspirin 81 mg daily, atorvastatin 80 mg daily -Continue Imdur 60 mg once daily  #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 Saralyn Pilar and he is in agreement.  Signed: Tristan Schroeder , PA-C 05/17/2022, 9:20 AM [facility]

## 2022-05-17 NOTE — Progress Notes (Signed)
Mobility Specialist - Progress Note    05/17/22 1600  Mobility  Activity Ambulated independently in hallway;Stood at bedside;Dangled on edge of bed  Level of Assistance Independent  Assistive Device Front wheel walker  Distance Ambulated (ft) 190 ft  Activity Response Tolerated well  $Mobility charge 1 Mobility     Pre-mobility: HR, BP, SpO2 During mobility: HR, BP, SpO2 Post-mobility: HR, BP, SPO2  Pt supine upon arrival using 3L. Completes all activities indep and ambulates 1 lap voicing no complaints --- SOB upon arrival but denies dizziness and chest pain. O2 at 97% on 3L. Pt is left EOB for recovery with needs in reach.  Merrily Brittle Mobility Specialist 05/17/22, 4:26 PM

## 2022-05-17 NOTE — Progress Notes (Signed)
  Progress Note   Patient: Darius Norris EUM:[id number] DOB: [birth year] DOA: 05/16/2022     1 DOS: the patient was seen and examined on 05/17/2022   Brief hospital course: Darius Norris is a 71 y.o. male with medical history significant for COPD with chronic respiratory failure on 3 L of oxygen continuous, history of A-fib, coronary artery disease, hypertension and GERD who presents to the ER via EMS for evaluation of worsening shortness of breath, chest tightness, cough with yellow mucus.  He was found to have atrial fibrillation with RVR, chest x-ray showed bilateral basal atelectasis.  He was  placed on steroids for COPD exacerbation.  Assessment and Plan: Paroxysmal atrial fibrillation with RVR. Elevated troponin. Chest pain. Patient condition improving, now in sinus.  Continue diltiazem and metoprolol. Patient is currently on Pradaxa for stroke prevention. Repeat echocardiogram showed ejection fraction 50 to 54%, diastolic LV function is indeterminate.  Mild LV hypertrophy. Chest pain most likely due to atrial fibrillation, patient has been evaluated by cardiology.  No plan for additional work-up.  Patient peak troponin was 42, consistent with atrial fibrillation.  Acute on chronic respiratory failure with hypoxemia. COPD exacerbation. Chronic diastolic congestive heart failure. Patient oxygenation has back to baseline.  BNP mildly elevated at 209, mostly from atrial fibrillation with RVR.   I will start some Lasix at 40 mg oral daily for now.  Continue steroids and Rocephin  Hypokalemia. Repleted, recheck level tomorrow.  Rheumatoid arthritis pain Continue methotrexate.  Type 2 diabetes. Continue current regimen.  Essential hypertension. Continue home treatment     Subjective:  Patient still complaining short of breath with exertion, did not have orthopnea or paroxysmal nocturnal dyspnea last night. Cough which is more productive of yellow mucus.  Physical  Exam: Vitals:   05/17/22 0015 05/17/22 0424 05/17/22 0747 05/17/22 1125  BP: 132/84 (!) 155/75 129/68 (!) 153/77  Pulse: 73 75 75 73  Resp: '18 17 19 20  '$ Temp: 97.8 F (36.6 C) 97.6 F (36.4 C) 98 F (36.7 C) 97.8 F (36.6 C)  TempSrc:   Oral   SpO2: 98% 98% 97% 94%  Weight:      Height:       General exam: Appears calm and comfortable  Respiratory system: Decreased breath sounds. Respiratory effort normal. Cardiovascular system: S1 & S2 heard, RRR. No JVD, murmurs, rubs, gallops or clicks. No pedal edema. Gastrointestinal system: Abdomen is nondistended, soft and nontender. No organomegaly or masses felt. Normal bowel sounds heard. Central nervous system: Alert and oriented. No focal neurological deficits. Extremities: Symmetric 5 x 5 power. Skin: No rashes, lesions or ulcers Psychiatry: Judgement and insight appear normal. Mood & affect appropriate.   Data Reviewed:  Chest x-ray reviewed, lab results reviewed.  Family Communication:   Disposition: Status is: Inpatient Remains inpatient appropriate because: Severity of disease,  Planned Discharge Destination: Home with Home Health    Time spent: 35 minutes  Author: Sharen Hones, MD 05/17/2022 2:23 PM  For on call review [email or web address].

## 2022-05-18 DIAGNOSIS — I5032 Chronic diastolic (congestive) heart failure: Secondary | ICD-10-CM | POA: Diagnosis not present

## 2022-05-18 DIAGNOSIS — I480 Paroxysmal atrial fibrillation: Secondary | ICD-10-CM | POA: Diagnosis not present

## 2022-05-18 DIAGNOSIS — J441 Chronic obstructive pulmonary disease with (acute) exacerbation: Secondary | ICD-10-CM | POA: Diagnosis not present

## 2022-05-18 LAB — BASIC METABOLIC PANEL
Anion gap: 6 (ref 5–15)
BUN: 20 mg/dL (ref 8–23)
CO2: 32 mmol/L (ref 22–32)
Calcium: 9.2 mg/dL (ref 8.9–10.3)
Chloride: 102 mmol/L (ref 98–111)
Creatinine, Ser: 0.83 mg/dL (ref 0.61–1.24)
GFR, Estimated: >60 mL/min (ref >60)
Glucose, Bld: 139 mg/dL — ABNORMAL HIGH (ref 70–99)
Potassium: 4.1 mmol/L (ref 3.5–5.1)
Sodium: 140 mmol/L (ref 135–145)

## 2022-05-18 LAB — MAGNESIUM: Magnesium: 2.2 mg/dL (ref 1.7–2.4)

## 2022-05-18 LAB — GLUCOSE, CAPILLARY: Glucose-Capillary: 129 mg/dL — ABNORMAL HIGH (ref 70–99)

## 2022-05-18 MED ORDER — PREDNISONE 20 MG PO TABS: ORAL_TABLET | ORAL | 0 refills | Status: DC

## 2022-05-18 MED ORDER — CEFDINIR 300 MG PO CAPS: 300.0000 mg | ORAL_CAPSULE | Freq: Two times a day (BID) | ORAL | 0 refills | Status: DC

## 2022-05-18 MED ORDER — PREDNISONE 20 MG PO TABS
ORAL_TABLET | ORAL | 0 refills | Status: AC
Start: 2022-05-19 — End: 2022-05-25

## 2022-05-18 NOTE — TOC CM/SW Note (Signed)
Patient has orders to discharge home today. Chart reviewed. PCP is the [facility] On 3 L chronic oxygen. No wounds. No TOC needs identified. CSW signing off.  Dayton Scrape, [place]

## 2022-05-18 NOTE — Discharge Summary (Addendum)
Physician Discharge Summary   Patient: Darius Norris MRN: [id number] DOB: December 09, 1951  Admit date:     05/16/2022  Discharge date: 05/18/22  Discharge Physician: Sharen Hones   PCP: [facility]   Recommendations at discharge:   Follow-up with PCP in 1 week. Follow-up with cardiology as scheduled.  Discharge Diagnoses: Principal Problem:   Paroxysmal atrial fibrillation with RVR (HCC) Active Problems:   Chest pain   COPD with acute exacerbation (HCC)   Diabetes mellitus without complication (HCC)   HTN (hypertension)   Depression with anxiety   GERD (gastroesophageal reflux disease)   Hypokalemia   Rheumatoid arthritis (HCC)   Chronic diastolic CHF (congestive heart failure) ([place]) Obesity with BMI 31.95.  Resolved Problems:   * No resolved hospital problems. *  Hospital Course: Darius Norris is a 71 y.o. male with medical history significant for COPD with chronic respiratory failure on 3 L of oxygen continuous, history of A-fib, coronary artery disease, hypertension and GERD who presents to the ER via EMS for evaluation of worsening shortness of breath, chest tightness, cough with yellow mucus.  He was found to have atrial fibrillation with RVR, chest x-ray showed bilateral basal atelectasis.  He was  placed on steroids for COPD exacerbation.    Assessment and Plan: Paroxysmal atrial fibrillation with RVR. Elevated troponin. Chest pain. Patient condition improving, now in sinus.  Continue diltiazem and metoprolol. Patient is currently on Pradaxa for stroke prevention. Repeat echocardiogram showed ejection fraction 50 to 16%, diastolic LV function is indeterminate.  Mild LV hypertrophy. Chest pain most likely due to atrial fibrillation, patient has been evaluated by cardiology.  No plan for additional work-up.  Patient peak troponin was 42, caused by atrial fibrillation. Cardiology has cleared patient for discharge.   Acute on chronic respiratory failure  with hypoxemia. COPD exacerbation. Chronic diastolic congestive heart failure. Patient oxygenation has back to baseline.  BNP mildly elevated at 209, mostly from atrial fibrillation with RVR.   He received oral Lasix, did not feel had a significant exacerbation congestive heart failure.  Condition had improved, we will continue 3 days of cefdinir and a steroid taper.   Hypokalemia. Potassium had improved.   Rheumatoid arthritis pain Continue methotrexate.   Type 2 diabetes. Continue current regimen.  Essential hypertension. Continue home treatment       Consultants: Cardiology. Procedures performed: None  Disposition: Home Diet recommendation:  Discharge Diet Orders (From admission, onward)     Start     Ordered   05/18/22 0000  Diet - low sodium heart healthy        05/18/22 1020           Cardiac diet DISCHARGE MEDICATION: Allergies as of 05/18/2022       Reactions   Ranitidine Hcl Shortness Of Breath   Zantac [ranitidine Hcl] Shortness Of Breath   Codeine Itching   Other reaction(s): PRURITIS   Erythromycin Hives   Other reaction(s): SWELLING (NON-SPECIFIC), HIVES, SWELLING (NON-SPECIFIC), HIVES   Hydrocodone    Other reaction(s): PRURITIS   Morphine Itching   Morphine And Related Itching   Tiotropium Bromide Monohydrate    Hydrocodone-acetaminophen Rash   Vicodin [hydrocodone-acetaminophen] Rash   Zantac [ranitidine] Rash   Other reaction(s): Eruption, HIVES, SHORTNESS OF BREATH, Eruption, HIVES, SHORTNESS OF BREATH        Medication List     STOP taking these medications    metoprolol succinate 100 MG 24 hr tablet Commonly known as: TOPROL-XL   omeprazole  20 MG capsule Commonly known as: PRILOSEC   urea 20 % cream Commonly known as: CARMOL       TAKE these medications    acetaminophen 325 MG tablet Commonly known as: TYLENOL Take 650 mg by mouth every 6 (six) hours as needed.   albuterol 108 (90 Base) MCG/ACT inhaler Commonly  known as: VENTOLIN HFA Inhale into the lungs.   aspirin EC 81 MG tablet Take 1 tablet (81 mg total) by mouth daily.   atorvastatin 80 MG tablet Commonly known as: LIPITOR Take 1 tablet (80 mg total) by mouth at bedtime.   budesonide-formoterol 160-4.5 MCG/ACT inhaler Commonly known as: SYMBICORT Inhale 2 puffs into the lungs 2 (two) times daily.   busPIRone 10 MG tablet Commonly known as: BUSPAR Take by mouth.   cefdinir 300 MG capsule Commonly known as: OMNICEF Take 1 capsule (300 mg total) by mouth 2 (two) times daily for 3 days.   clobetasol 0.05 % external solution Commonly known as: TEMOVATE Apply topically.   dabigatran 150 MG Caps capsule Commonly known as: PRADAXA Take 150 mg by mouth 2 (two) times daily.   diltiazem 180 MG 24 hr capsule Commonly known as: CARDIZEM CD Take 1 capsule (180 mg total) by mouth daily. What changed: how much to take   folic acid 1 MG tablet Commonly known as: FOLVITE Take by mouth.   gabapentin 300 MG capsule Commonly known as: NEURONTIN Take 1 capsule by mouth 3 (three) times daily as needed.   guaifenesin 400 MG Tabs tablet Commonly known as: HUMIBID E Take by mouth.   hydrocortisone 2.5 % cream Apply topically.   hydrOXYzine 25 MG tablet Commonly known as: ATARAX Take by mouth.   Ipratropium-Albuterol 20-100 MCG/ACT Aers respimat Commonly known as: COMBIVENT Inhale 1 puff into the lungs every 6 (six) hours as needed for wheezing or shortness of breath.   isosorbide mononitrate 60 MG 24 hr tablet Commonly known as: IMDUR Take 60 mg by mouth daily.   ketorolac 0.5 % ophthalmic solution Commonly known as: ACULAR INSTILL 1 DROP OPERATIVE EYE FOUR TIMES A DAY FOR POSTOPERATIVE PAIN/INFLAMMATION SEND TO 4B AMB SURGERY OR ON DAY OF SURGERY 04/05/22   lidocaine 5 % ointment Commonly known as: XYLOCAINE Apply topically.   loratadine 10 MG tablet Commonly known as: CLARITIN Take 10 mg by mouth daily as needed for  allergies.   losartan 25 MG tablet Commonly known as: COZAAR Take 1 tablet by mouth 2 (two) times daily.   Melatonin 3 MG Caps Take 2 capsules by mouth at bedtime.   metFORMIN 500 MG 24 hr tablet Commonly known as: GLUCOPHAGE-XR Take 500 mg by mouth in the morning and at bedtime.   methotrexate 2.5 MG tablet Commonly known as: RHEUMATREX Take 10 mg by mouth once a week.   metoprolol tartrate 50 MG tablet Commonly known as: LOPRESSOR Take by mouth.   mometasone 220 MCG/INH inhaler Commonly known as: ASMANEX Take by mouth.   moxifloxacin 0.5 % ophthalmic solution Commonly known as: VIGAMOX INSTILL 1 DROP IN LEFT EYE FOUR TIMES A DAY FOR CATARACT SURGERY SEND TO 4B AMB SURGERY OR ON DAY OF SURGERY 04/05/22   predniSONE 20 MG tablet Commonly known as: DELTASONE Take 1 tablet (20 mg total) by mouth daily with breakfast for 3 days, THEN 0.5 tablets (10 mg total) daily with breakfast for 3 days. Start taking on: May 19, 2022   senna 8.6 MG Tabs tablet Commonly known as: SENOKOT Take 1 tablet by mouth  daily as needed for mild constipation.   simethicone 80 MG chewable tablet Commonly known as: MYLICON CHEW ONE TABLET BY MOUTH THREE TIMES A DAY AFTER MEALS AS NEEDED FOR GAS        Follow-up Information     Paraschos, Alexander, MD. Go in 1 week(s).   Specialty: Cardiology Contact information: [facility] [place] [address] [place] Follow up in 1 week(s).   Specialty: General Practice Contact information: [place] [address] [phone]                Discharge Exam: Danley Danker Weights   05/16/22 0931  Weight: 89.8 kg   General exam: Appears calm and comfortable  Respiratory system: Clear to auscultation. Respiratory effort normal. Cardiovascular system: S1 & S2 heard, RRR. No JVD, murmurs, rubs, gallops or clicks. No pedal edema. Gastrointestinal system:  Abdomen is nondistended, soft and nontender. No organomegaly or masses felt. Normal bowel sounds heard. Central nervous system: Alert and oriented. No focal neurological deficits. Extremities: Symmetric 5 x 5 power. Skin: No rashes, lesions or ulcers Psychiatry: Judgement and insight appear normal. Mood & affect appropriate.    Condition at discharge: good  The results of significant diagnostics from this hospitalization (including imaging, microbiology, ancillary and laboratory) are listed below for reference.   Imaging Studies: DG Chest Port 1 View  Result Date: 05/16/2022 CLINICAL DATA:  Chest pain. EXAM: PORTABLE CHEST 1 VIEW COMPARISON:  February 22, 2022. FINDINGS: The heart size and mediastinal contours are within normal limits. Mild bibasilar atelectasis or infiltrates are noted. Emphysematous disease is noted in the upper lobes. The visualized skeletal structures are unremarkable. IMPRESSION: Mild bibasilar subsegmental atelectasis or infiltrates are noted. Emphysema (ICD10-J43.9). Electronically Signed   By: Marijo Conception M.D.   On: 05/16/2022 10:09   ECHOCARDIOGRAM COMPLETE  Result Date: 05/17/2022    ECHOCARDIOGRAM REPORT   Patient Name:   Darius Norris Date of Exam: 05/16/2022 Medical Rec #:  [id number]       Height:       66.0 in Accession #:    [id number]      Weight:       198.0 lb Date of Birth:  1951-01-24       BSA:          1.991 m Patient Age:    71 years        BP:           113/95 mmHg Patient Gender: M               HR:           121 bpm. Exam Location:  [facility] Procedure: 2D Echo, Color Doppler and Cardiac Doppler Indications:     I48.91 Atrial Fibrillation  History:         Patient has prior history of Echocardiogram examinations.                  Previous Myocardial Infarction and CAD, COPD and Emphysema,                  Arrythmias:Atrial Fibrillation; Risk Factors:Hypertension,                  Diabetes and Dyslipidemia.  Sonographer:     Rosalia Hammers Referring Phys:   [id number] AGBATA Diagnosing Phys: Isaias Cowman MD  Sonographer Comments: Technically difficult study due  to poor echo windows. Image acquisition challenging due to patient body habitus, Image acquisition challenging due to COPD and Image acquisition challenging due to respiratory motion. IMPRESSIONS  1. Left ventricular ejection fraction, by estimation, is 55 to 60%. The left ventricle has normal function. The left ventricle has no regional wall motion abnormalities. There is mild left ventricular hypertrophy. Left ventricular diastolic parameters are indeterminate.  2. Right ventricular systolic function is normal. The right ventricular size is normal.  3. The mitral valve is normal in structure. Mild mitral valve regurgitation. No evidence of mitral stenosis.  4. Tricuspid valve regurgitation is mild to moderate.  5. The aortic valve is normal in structure. Aortic valve regurgitation is not visualized. No aortic stenosis is present.  6. The inferior vena cava is normal in size with greater than 50% respiratory variability, suggesting right atrial pressure of 3 mmHg. FINDINGS  Left Ventricle: Left ventricular ejection fraction, by estimation, is 55 to 60%. The left ventricle has normal function. The left ventricle has no regional wall motion abnormalities. The left ventricular internal cavity size was normal in size. There is  mild left ventricular hypertrophy. Left ventricular diastolic parameters are indeterminate. Right Ventricle: The right ventricular size is normal. No increase in right ventricular wall thickness. Right ventricular systolic function is normal. Left Atrium: Left atrial size was normal in size. Right Atrium: Right atrial size was normal in size. Pericardium: There is no evidence of pericardial effusion. Mitral Valve: The mitral valve is normal in structure. Mild mitral valve regurgitation. No evidence of mitral valve stenosis. MV peak gradient, 3.6 mmHg. The mean mitral valve  gradient is 1.0 mmHg. Tricuspid Valve: The tricuspid valve is normal in structure. Tricuspid valve regurgitation is mild to moderate. No evidence of tricuspid stenosis. Aortic Valve: The aortic valve is normal in structure. Aortic valve regurgitation is not visualized. No aortic stenosis is present. Aortic valve mean gradient measures 3.0 mmHg. Aortic valve peak gradient measures 6.6 mmHg. Aortic valve area, by VTI measures 2.20 cm. Pulmonic Valve: The pulmonic valve was normal in structure. Pulmonic valve regurgitation is not visualized. No evidence of pulmonic stenosis. Aorta: The aortic root is normal in size and structure. Venous: The inferior vena cava is normal in size with greater than 50% respiratory variability, suggesting right atrial pressure of 3 mmHg. IAS/Shunts: No atrial level shunt detected by color flow Doppler.  LEFT VENTRICLE PLAX 2D LVIDd:         4.05 cm LVIDs:         2.82 cm LV PW:         1.32 cm LV IVS:        1.40 cm LVOT diam:     1.80 cm LV SV:         47 LV SV Index:   24 LVOT Area:     2.54 cm  RIGHT VENTRICLE RV Basal diam:  3.52 cm LEFT ATRIUM             Index        RIGHT ATRIUM           Index LA diam:        3.10 cm 1.56 cm/m   RA Area:     15.10 cm LA Vol (A2C):   53.5 ml 26.87 ml/m  RA Volume:   39.70 ml  19.94 ml/m LA Vol (A4C):   36.0 ml 18.08 ml/m LA Biplane Vol: 44.3 ml 22.25 ml/m  AORTIC VALVE  PULMONIC VALVE AV Area (Vmax):    2.60 cm     PV Vmax:       1.55 m/s AV Area (Vmean):   2.46 cm     PV Vmean:      95.600 cm/s AV Area (VTI):     2.20 cm     PV VTI:        0.204 m AV Vmax:           128.00 cm/s  PV Peak grad:  9.6 mmHg AV Vmean:          82.700 cm/s  PV Mean grad:  5.0 mmHg AV VTI:            0.213 m AV Peak Grad:      6.6 mmHg AV Mean Grad:      3.0 mmHg LVOT Vmax:         131.00 cm/s LVOT Vmean:        80.000 cm/s LVOT VTI:          0.184 m LVOT/AV VTI ratio: 0.86  AORTA Ao Root diam: 4.00 cm MITRAL VALVE MV Area (PHT): 3.89 cm     SHUNTS MV Area VTI:   2.53 cm    Systemic VTI:  0.18 m MV Peak grad:  3.6 mmHg    Systemic Diam: 1.80 cm MV Mean grad:  1.0 mmHg MV Vmax:       0.95 m/s MV Vmean:      48.7 cm/s MV Decel Time: 195 msec MV E velocity: 84.20 cm/s Isaias Cowman MD Electronically signed by Isaias Cowman MD Signature Date/Time: 05/17/2022/1:23:20 PM    Final     Microbiology: Results for orders placed or performed during the hospital encounter of 05/16/22  Resp Panel by RT-PCR (Flu A&B, Covid) Anterior Nasal Swab     Status: None   Collection Time: 05/16/22  9:54 AM   Specimen: Anterior Nasal Swab  Result Value Ref Range Status   SARS Coronavirus 2 by RT PCR NEGATIVE NEGATIVE Final    Comment: (NOTE) SARS-CoV-2 target nucleic acids are NOT DETECTED.  The SARS-CoV-2 RNA is generally detectable in upper respiratory specimens during the acute phase of infection. The lowest concentration of SARS-CoV-2 viral copies this assay can detect is 138 copies/mL. A negative result does not preclude SARS-Cov-2 infection and should not be used as the sole basis for treatment or other patient management decisions. A negative result may occur with  improper specimen collection/handling, submission of specimen other than nasopharyngeal swab, presence of viral mutation(s) within the areas targeted by this assay, and inadequate number of viral copies(<138 copies/mL). A negative result must be combined with clinical observations, patient history, and epidemiological information. The expected result is Negative.  Fact Sheet for Patients:  EntrepreneurPulse.com.au  Fact Sheet for Healthcare Providers:  IncredibleEmployment.be  This test is no t yet approved or cleared by the [place] FDA and  has been authorized for detection and/or diagnosis of SARS-CoV-2 by FDA under an Emergency Use Authorization (EUA). This EUA will remain  in effect (meaning this test can be used) for the  duration of the COVID-19 declaration under Section 564(b)(1) of the Act, 21 U.S.C.section 360bbb-3(b)(1), unless the authorization is terminated  or revoked sooner.       Influenza A by PCR NEGATIVE NEGATIVE Final   Influenza B by PCR NEGATIVE NEGATIVE Final    Comment: (NOTE) The Xpert Xpress SARS-CoV-2/FLU/RSV plus assay is intended as an aid in the diagnosis of influenza  from Nasopharyngeal swab specimens and should not be used as a sole basis for treatment. Nasal washings and aspirates are unacceptable for Xpert Xpress SARS-CoV-2/FLU/RSV testing.  Fact Sheet for Patients: EntrepreneurPulse.com.au  Fact Sheet for Healthcare Providers: IncredibleEmployment.be  This test is not yet approved or cleared by the [place] FDA and has been authorized for detection and/or diagnosis of SARS-CoV-2 by FDA under an Emergency Use Authorization (EUA). This EUA will remain in effect (meaning this test can be used) for the duration of the COVID-19 declaration under Section 564(b)(1) of the Act, 21 U.S.C. section 360bbb-3(b)(1), unless the authorization is terminated or revoked.  Performed at [facility], [address] [place], [place] [address]     Labs: CBC: Recent Labs  Lab 05/16/22 [id number] 05/17/22 0641  WBC 15.8* 7.7  NEUTROABS 13.1*  --   HGB 13.2 11.8*  HCT 42.0 37.2*  MCV 91.5 90.3  PLT 234 163   Basic Metabolic Panel: Recent Labs  Lab 05/16/22 0954 05/16/22 1203 05/17/22 0641 05/18/22 0619  NA 136  --  135 140  K 3.3*  --  4.0 4.1  CL 98  --  97* 102  CO2 28  --  30 32  GLUCOSE 144*  --  140* 139*  BUN 13  --  18 20  CREATININE 0.72  --  0.74 0.83  CALCIUM 8.6*  --  8.6* 9.2  MG  --  1.7  --  2.2   Liver Function Tests: Recent Labs  Lab 05/16/22 0954  AST 15  ALT 18  ALKPHOS 67  BILITOT 1.0  PROT 6.6  ALBUMIN 3.6   CBG: Recent Labs  Lab 05/17/22 0741 05/17/22 1125 05/17/22 1709 05/17/22 2136  05/18/22 0801  GLUCAP 139* 107* 204* 155* 129*    Discharge time spent: greater than 30 minutes.  Signed: Sharen Hones, MD [facility] 05/18/2022

## 2022-05-18 NOTE — Progress Notes (Signed)
[place] NOTE       Patient ID: CHRISEAN KLOTH MRN: [id number] DOB/AGE: 1951/09/11 71 y.o.  Admit date: 05/16/2022 Referring Physician Dr. Francine Graven Primary Physician [facility] Primary Cardiologist Dr. 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  He was recently seen at the [place] last week for similar symptoms and has completed a Z-Pak and steroid taper without much relief.  He was found to be in atrial fibrillation with RVR on admission, cardiology is consulted for further assistance.  Interval History: -overall feels better, no chest pain, palpitations. -remains with some difficulty getting a deep breath, slight cough remains -Echocardiogram resulted with improved LVEF 55-60% without regional WMA's.  With mild LVH, mild MR, mild to moderate TR.  EF in 2019 was previously 45%. -Converted to sinus rhythm yesterday on diltiazem drip, home cardizem resumed  Review of systems complete and found to be negative unless listed above     Past Medical History:  Diagnosis Date   A-fib ([place])    CAD (coronary artery disease)    Colitis    COPD (chronic obstructive pulmonary disease) (HCC)    Emphysema lung (HCC)    GERD (gastroesophageal reflux disease)    Hypertension    MI (myocardial infarction) ([place]) 1999    Past Surgical History:  Procedure Laterality Date   ABDOMINAL HERNIA REPAIR Bilateral    CHOLECYSTECTOMY     COLONOSCOPY WITH PROPOFOL N/A 02/01/2018   Procedure: COLONOSCOPY WITH PROPOFOL;  Surgeon: Jonathon Bellows, MD;  Location: [facility];  Service:  Gastroenterology;  Laterality: N/A;   KNEE ARTHROSCOPY Bilateral     Medications Prior to Admission  Medication Sig Dispense Refill Last Dose   acetaminophen (TYLENOL) 325 MG tablet Take 650 mg by mouth every 6 (six) hours as needed.   Past Week   albuterol (VENTOLIN HFA) 108 (90 Base) MCG/ACT inhaler Inhale into the lungs.   05/15/2022   aspirin EC 81 MG EC tablet Take 1 tablet (81 mg total) by mouth daily. 180 tablet 0 05/15/2022   atorvastatin (LIPITOR) 80 MG tablet Take 1 tablet (80 mg total) by mouth at bedtime. 30 tablet 0 05/15/2022   budesonide-formoterol (SYMBICORT) 160-4.5 MCG/ACT inhaler Inhale 2 puffs into the lungs 2 (two) times daily.   05/15/2022   busPIRone (BUSPAR) 10 MG tablet Take by mouth.   05/15/2022   clobetasol (TEMOVATE) 0.05 % external solution Apply topically.   Past Week   dabigatran (PRADAXA) 150 MG CAPS capsule Take 150 mg by mouth 2 (two) times daily.   05/15/2022   diltiazem (CARDIZEM CD) 180 MG 24 hr capsule Take 1 capsule (180 mg total) by mouth daily. (Patient taking differently: Take 240 mg by mouth daily.) 90 capsule 0 01/18/70   folic acid (FOLVITE) 1 MG tablet Take by mouth.   05/15/2022   gabapentin (NEURONTIN) 300 MG capsule Take 1 capsule by mouth 3 (three) times daily as needed.   05/15/2022   hydrocortisone 2.5 % cream Apply topically.   Past Week   hydrOXYzine (ATARAX/VISTARIL) 25 MG tablet Take by mouth.   Past Week   Ipratropium-Albuterol (COMBIVENT) 20-100 MCG/ACT AERS respimat  Inhale 1 puff into the lungs every 6 (six) hours as needed for wheezing or shortness of breath.   Past Week   isosorbide mononitrate (IMDUR) 60 MG 24 hr tablet Take 60 mg by mouth daily.   05/15/2022   ketorolac (ACULAR) 0.5 % ophthalmic solution INSTILL 1 DROP OPERATIVE EYE FOUR TIMES A DAY FOR POSTOPERATIVE PAIN/INFLAMMATION SEND TO 4B AMB SURGERY OR ON DAY OF SURGERY 04/05/22   Past Week   lidocaine (XYLOCAINE) 5 % ointment Apply topically.   Past Week   loratadine (CLARITIN) 10  MG tablet Take 10 mg by mouth daily as needed for allergies.    05/15/2022   losartan (COZAAR) 25 MG tablet Take 1 tablet by mouth 2 (two) times daily.   05/15/2022   Melatonin 3 MG CAPS Take 2 capsules by mouth at bedtime.   05/15/2022   metFORMIN (GLUCOPHAGE-XR) 500 MG 24 hr tablet Take 500 mg by mouth in the morning and at bedtime.   05/15/2022   methotrexate (RHEUMATREX) 2.5 MG tablet Take 10 mg by mouth once a week.   Past Week   metoprolol tartrate (LOPRESSOR) 50 MG tablet Take by mouth.   05/15/2022   mometasone (ASMANEX) 220 MCG/INH inhaler Take by mouth.   05/15/2022   moxifloxacin (VIGAMOX) 0.5 % ophthalmic solution INSTILL 1 DROP IN LEFT EYE FOUR TIMES A DAY FOR CATARACT SURGERY SEND TO 4B AMB SURGERY OR ON DAY OF SURGERY 04/05/22   05/15/2022   senna (SENOKOT) 8.6 MG TABS tablet Take 1 tablet by mouth daily as needed for mild constipation.   Past Week   simethicone (MYLICON) 80 MG chewable tablet CHEW ONE TABLET BY MOUTH THREE TIMES A DAY AFTER MEALS AS NEEDED FOR GAS   Past Week   guaifenesin (HUMIBID E) 400 MG TABS tablet Take by mouth.      metoprolol succinate (TOPROL-XL) 100 MG 24 hr tablet TAKE ONE-HALF TABLET BY MOUTH TWO TIMES A DAY (Patient not taking: Reported on 05/16/2022)   Not Taking   omeprazole (PRILOSEC) 20 MG capsule Take 20 mg by mouth daily.  (Patient not taking: Reported on 05/08/2022)      urea (CARMOL) 20 % cream Apply topically. (Patient not taking: Reported on 05/08/2022)       Social History   Socioeconomic History   Marital status: Married    Spouse name: Not on file   Number of children: Not on file   Years of education: Not on file   Highest education level: Not on file  Occupational History   Not on file  Tobacco Use   Smoking status: Former    Packs/day: 1.00    Years: 30.00    Pack years: 30.00    Types: Cigarettes    Quit date: 12/18/2001    Years since quitting: 20.4   Smokeless tobacco: Never  Vaping Use   Vaping Use: Never used  Substance and  Sexual Activity   Alcohol use: No   Drug use: No   Sexual activity: Never  Other Topics Concern   Not on file  Social History Narrative   Not on file   Social Determinants of Health   Financial Resource Strain: Not on file  Food Insecurity: Not on file  Transportation Needs: Not on file  Physical Activity: Not on file  Stress: Not on file  Social Connections: Not on file  Intimate Partner Violence: Not on file    Family History  Problem Relation Age of Onset   CAD Father  Stroke Father    CAD Sister    Stroke Brother       PHYSICAL EXAM General: Pleasant elderly and conversational Caucasian male, well nourished, in no acute distress.  Sitting at edge of bed after finishing breakfast HEENT:  Normocephalic and atraumatic. Neck:  No JVD.  Lungs: Normal respiratory effort on 3L by [place].  Decreased breath sounds bilaterally without appreciable crackles or wheezes.   Heart: Regular rate and rhythm. Normal S1 and S2 without gallops or murmurs.  Abdomen: Obese appearing.  Msk: Normal strength and tone for age. Extremities: No clubbing, cyanosis.  Cap refill brisk with erythema on bilateral lower extremities without edema.  Neuro: Alert and oriented X 3. Psych:  Answers questions appropriately.   Labs:   Lab Results  Component Value Date   WBC 7.7 05/17/2022   HGB 11.8 (L) 05/17/2022   HCT 37.2 (L) 05/17/2022   MCV 90.3 05/17/2022   PLT 209 05/17/2022    Recent Labs  Lab 05/16/22 0954 05/17/22 0641 05/18/22 0619  NA 136   < > 140  K 3.3*   < > 4.1  CL 98   < > 102  CO2 28   < > 32  BUN 13   < > 20  CREATININE 0.72   < > 0.83  CALCIUM 8.6*   < > 9.2  PROT 6.6  --   --   BILITOT 1.0  --   --   ALKPHOS 67  --   --   ALT 18  --   --   AST 15  --   --   GLUCOSE 144*   < > 139*   < > = values in this interval not displayed.    Lab Results  Component Value Date   CKTOTAL 74 02/05/2013   CKMB 0.9 02/05/2013   TROPONINI <0.03 01/05/2019   TROPONINI <0.03  01/05/2019     Lab Results  Component Value Date   CHOL 106 12/06/2021   CHOL 116 09/04/2016   CHOL 185 02/04/2013   Lab Results  Component Value Date   HDL 33 (L) 12/06/2021   HDL 42 09/04/2016   HDL 38 (L) 02/04/2013   Lab Results  Component Value Date   LDLCALC 51 12/06/2021   LDLCALC 51 09/04/2016   LDLCALC 114 (H) 02/04/2013   Lab Results  Component Value Date   TRIG 108 12/06/2021   TRIG 116 09/04/2016   TRIG 166 02/04/2013   Lab Results  Component Value Date   CHOLHDL 3.2 12/06/2021   CHOLHDL 2.8 09/04/2016   No results found for: LDLDIRECT    Radiology: [facility] Chest Port 1 View  Result Date: 05/16/2022 CLINICAL DATA:  Chest pain. EXAM: PORTABLE CHEST 1 VIEW COMPARISON:  February 22, 2022. FINDINGS: The heart size and mediastinal contours are within normal limits. Mild bibasilar atelectasis or infiltrates are noted. Emphysematous disease is noted in the upper lobes. The visualized skeletal structures are unremarkable. IMPRESSION: Mild bibasilar subsegmental atelectasis or infiltrates are noted. Emphysema (ICD10-J43.9). Electronically Signed   By: Marijo Conception M.D.   On: 05/16/2022 10:09   ECHOCARDIOGRAM COMPLETE  Result Date: 05/17/2022    ECHOCARDIOGRAM REPORT   Patient Name:   PARRIS CUDWORTH Date of Exam: 05/16/2022 Medical Rec #:  [id number]       Height:       66.0 in Accession #:    [id number]      Weight:       198.0 lb  Date of Birth:  [birth year]       BSA:          1.991 m Patient Age:    57 years        BP:           113/95 mmHg Patient Gender: M               HR:           121 bpm. Exam Location:  [facility] Procedure: 2D Echo, Color Doppler and Cardiac Doppler Indications:     I48.91 Atrial Fibrillation  History:         Patient has prior history of Echocardiogram examinations.                  Previous Myocardial Infarction and CAD, COPD and Emphysema,                  Arrythmias:Atrial Fibrillation; Risk Factors:Hypertension,                  Diabetes and Dyslipidemia.   Sonographer:     Rosalia Hammers Referring Phys:  [id number] AGBATA Diagnosing Phys: Isaias Cowman MD  Sonographer Comments: Technically difficult study due to poor echo windows. Image acquisition challenging due to patient body habitus, Image acquisition challenging due to COPD and Image acquisition challenging due to respiratory motion. IMPRESSIONS  1. Left ventricular ejection fraction, by estimation, is 55 to 60%. The left ventricle has normal function. The left ventricle has no regional wall motion abnormalities. There is mild left ventricular hypertrophy. Left ventricular diastolic parameters are indeterminate.  2. Right ventricular systolic function is normal. The right ventricular size is normal.  3. The mitral valve is normal in structure. Mild mitral valve regurgitation. No evidence of mitral stenosis.  4. Tricuspid valve regurgitation is mild to moderate.  5. The aortic valve is normal in structure. Aortic valve regurgitation is not visualized. No aortic stenosis is present.  6. The inferior vena cava is normal in size with greater than 50% respiratory variability, suggesting right atrial pressure of 3 mmHg. FINDINGS  Left Ventricle: Left ventricular ejection fraction, by estimation, is 55 to 60%. The left ventricle has normal function. The left ventricle has no regional wall motion abnormalities. The left ventricular internal cavity size was normal in size. There is  mild left ventricular hypertrophy. Left ventricular diastolic parameters are indeterminate. Right Ventricle: The right ventricular size is normal. No increase in right ventricular wall thickness. Right ventricular systolic function is normal. Left Atrium: Left atrial size was normal in size. Right Atrium: Right atrial size was normal in size. Pericardium: There is no evidence of pericardial effusion. Mitral Valve: The mitral valve is normal in structure. Mild mitral valve regurgitation. No evidence of mitral valve stenosis. MV peak  gradient, 3.6 mmHg. The mean mitral valve gradient is 1.0 mmHg. Tricuspid Valve: The tricuspid valve is normal in structure. Tricuspid valve regurgitation is mild to moderate. No evidence of tricuspid stenosis. Aortic Valve: The aortic valve is normal in structure. Aortic valve regurgitation is not visualized. No aortic stenosis is present. Aortic valve mean gradient measures 3.0 mmHg. Aortic valve peak gradient measures 6.6 mmHg. Aortic valve area, by VTI measures 2.20 cm. Pulmonic Valve: The pulmonic valve was normal in structure. Pulmonic valve regurgitation is not visualized. No evidence of pulmonic stenosis. Aorta: The aortic root is normal in size and structure. Venous: The inferior vena cava is normal in size with greater than 50% respiratory variability,  suggesting right atrial pressure of 3 mmHg. IAS/Shunts: No atrial level shunt detected by color flow Doppler.  LEFT VENTRICLE PLAX 2D LVIDd:         4.05 cm LVIDs:         2.82 cm LV PW:         1.32 cm LV IVS:        1.40 cm LVOT diam:     1.80 cm LV SV:         47 LV SV Index:   24 LVOT Area:     2.54 cm  RIGHT VENTRICLE RV Basal diam:  3.52 cm LEFT ATRIUM             Index        RIGHT ATRIUM           Index LA diam:        3.10 cm 1.56 cm/m   RA Area:     15.10 cm LA Vol (A2C):   53.5 ml 26.87 ml/m  RA Volume:   39.70 ml  19.94 ml/m LA Vol (A4C):   36.0 ml 18.08 ml/m LA Biplane Vol: 44.3 ml 22.25 ml/m  AORTIC VALVE                    PULMONIC VALVE AV Area (Vmax):    2.60 cm     PV Vmax:       1.55 m/s AV Area (Vmean):   2.46 cm     PV Vmean:      95.600 cm/s AV Area (VTI):     2.20 cm     PV VTI:        0.204 m AV Vmax:           128.00 cm/s  PV Peak grad:  9.6 mmHg AV Vmean:          82.700 cm/s  PV Mean grad:  5.0 mmHg AV VTI:            0.213 m AV Peak Grad:      6.6 mmHg AV Mean Grad:      3.0 mmHg LVOT Vmax:         131.00 cm/s LVOT Vmean:        80.000 cm/s LVOT VTI:          0.184 m LVOT/AV VTI ratio: 0.86  AORTA Ao Root diam: 4.00 cm  MITRAL VALVE MV Area (PHT): 3.89 cm    SHUNTS MV Area VTI:   2.53 cm    Systemic VTI:  0.18 m MV Peak grad:  3.6 mmHg    Systemic Diam: 1.80 cm MV Mean grad:  1.0 mmHg MV Vmax:       0.95 m/s MV Vmean:      48.7 cm/s MV Decel Time: 195 msec MV E velocity: 84.20 cm/s Isaias Cowman MD Electronically signed by Isaias Cowman MD Signature Date/Time: 05/17/2022/1:23:20 PM    Final     ECHO ECHOCARDIOGRAPHIC DESCRIPTIONS  AORTIC ROOT                   Size: Normal             Dissection: INDETERM FOR DISSECTION  AORTIC VALVE               Leaflets: Tricuspid                   Morphology: Normal  Mobility: Fully mobile  LEFT VENTRICLE                   Size: Normal                        Anterior: Normal            Contraction: REGIONALLY IMPAIRED            Lateral: HYPOCONTRACTILE             Closest EF: 45% (Estimated)                 Septal: Normal              LV Masses: No Masses                       Apical: Normal                    LVH: None                          Inferior: Normal                                                      Posterior: HYPOCONTRACTILE           Dias.FxClass: (Grade 1) relaxation abnormal, E/A reversal  MITRAL VALVE               Leaflets: Normal                        Mobility: Fully mobile             Morphology: Normal  LEFT ATRIUM                   Size: Normal                       LA Masses: No masses              IA Septum: Normal IAS  MAIN PA                   Size: Normal  PULMONIC VALVE             Morphology: Normal                        Mobility: Fully mobile  RIGHT VENTRICLE              RV Masses: No Masses                         Size: Normal              Free Wall: Normal                     Contraction: Normal  TRICUSPID VALVE               Leaflets: Normal                        Mobility: Fully mobile  Morphology: Normal  RIGHT ATRIUM                   Size: Normal                        RA Other:  None                RA Mass: No masses  PERICARDIUM                  Fluid: No effusion  INFERIOR VENACAVA                   Size: Normal Normal respiratory collapse  _________________________________________________________________________________________   DOPPLER ECHO and OTHER SPECIAL PROCEDURES                 Aortic: TRIVIAL AR                 No AS                         141.9 cm/sec peak vel      8.1 mmHg peak grad                         3.9 mmHg mean grad         2.7 cm^2 by DOPPLER                 Mitral: TRIVIAL MR                 No MS                         MV Inflow E Vel = 51.4 cm/sec     MV Annulus E'Vel = 4.9 cm/sec                         E/E'Ratio = 10.5              Tricuspid: TRIVIAL TR                 No TS                         242.4 cm/sec peak TR vel   28.5 mmHg peak RV pressure              Pulmonary: TRIVIAL PR                 No PS  _________________________________________________________________________________________  INTERPRETATION  MILD SEGMENTAL LV SYSTOLIC DYSFUNCTION WITH AN ESTIMATED EF = 45 %  NORMAL RIGHT VENTRICULAR SYSTOLIC FUNCTION  TRIVIAL REGURGITATION NOTED (See above)  NO VALVULAR STENOSIS   TELEMETRY reviewed by me: Atrial fibrillation with RVR, rates 120s-140s during interview  EKG reviewed by me: Atrial fibrillation, rate 129, repeat 5/31   ASSESSMENT AND PLAN:  Eliav Mechling is a 86yoM with a PMH of CAD (s/p NSTEMI 2001, heart cath 2008 with 40% mid Lcx, 100% OM2), 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  He was recently seen at the [place] last week for similar symptoms and has completed a Z-Pak and steroid taper  without much relief.  He was found to be in atrial fibrillation  with RVR on admission, cardiology is consulted for further assistance.  #Paroxysmal atrial fibrillation with RVR #Elevated troponin The patient presents with a 1 week history of rattling productive cough, treated for a URI with steroids and azithromycin last week without much improvement.  He noted this morning acute onset of chest discomfort with radiation to both his arms with associated heart racing, and heart rate by telemetry on admission in the 150s.  High-sensitivity troponin minimally elevated and flat trending, likely due to demand ischemia from his rapid heart rate, and not ACS.  His rapid A-fib is likely due to his underlying lung disease with acute exacerbation as below. -S/p diltiazem IV x1, s/p diltiazem drip with conversion to sinus rhythm  -Resume home Cardizem CD 240 mg once daily  -Continue metoprolol tartrate 50 mg twice daily -Continue Pradaxa 150 mg BID for stroke risk reduction, CHA2DS2-VASc 3 -Monitor and replete electrolytes -Repeat echocardiogram complete resulted with improved ejection fraction of 55-60% -Defer invasive cardiac diagnostics  -Okay for discharge from a cardiac standpoint today.  Will need close follow-up with his regular cardiologist Dr. Saralyn Pilar in 1 to 2 weeks.  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 Saralyn Pilar and he is in agreement.  Signed: Tristan Schroeder , PA-C 05/18/2022, 8:57 AM [facility]

## 2022-05-18 NOTE — Progress Notes (Signed)
Mobility Specialist - Progress Note    05/18/22 1100  Mobility  Activity Ambulated independently in hallway;Stood at bedside;Dangled on edge of bed;Ambulated independently in room  Level of Assistance Independent  Assistive Device None  Distance Ambulated (ft) 50 ft  Activity Response Tolerated well  $Mobility charge 1 Mobility     Pt sitting EOB upon arrival using 3L preparing for d/c. Declines ambulating too far in hallway, but agreeable to short distance d/t leaving soon. Ambulates 52f No AD voicing no complaints. Pt is left EOB with needs in reach.  MMerrily BrittleMobility Specialist 05/18/22, 11:45 AM

## 2022-05-18 NOTE — Plan of Care (Signed)
  Problem: Activity: Goal: Ability to tolerate increased activity will improve Outcome: Progressing   Problem: Respiratory: Goal: Levels of oxygenation will improve Outcome: Progressing   Problem: Clinical Measurements: Goal: Will remain free from infection Outcome: Progressing   Problem: Clinical Measurements: Goal: Cardiovascular complication will be avoided Outcome: Progressing   Problem: Elimination: Goal: Will not experience complications related to bowel motility Outcome: Progressing   Problem: Elimination: Goal: Will not experience complications related to urinary retention Outcome: Progressing   Problem: Pain Managment: Goal: General experience of comfort will improve Outcome: Progressing

## 2022-05-19 ENCOUNTER — Emergency Department: Payer: No Typology Code available for payment source

## 2022-05-19 ENCOUNTER — Observation Stay: Payer: No Typology Code available for payment source

## 2022-05-19 ENCOUNTER — Inpatient Hospital Stay
Admission: EM | Admit: 2022-05-19 | Discharge: 2022-05-24 | DRG: 871 | Disposition: A | Discharge status: Home / Self Care | Payer: No Typology Code available for payment source | Attending: Hospitalist | Admitting: Hospitalist

## 2022-05-19 DIAGNOSIS — I4891 Unspecified atrial fibrillation: Secondary | ICD-10-CM | POA: Diagnosis not present

## 2022-05-19 DIAGNOSIS — Z9981 Dependence on supplemental oxygen: Secondary | ICD-10-CM

## 2022-05-19 DIAGNOSIS — E875 Hyperkalemia: Secondary | ICD-10-CM | POA: Diagnosis not present

## 2022-05-19 DIAGNOSIS — Z87891 Personal history of nicotine dependence: Secondary | ICD-10-CM

## 2022-05-19 DIAGNOSIS — I252 Old myocardial infarction: Secondary | ICD-10-CM

## 2022-05-19 DIAGNOSIS — J9621 Acute and chronic respiratory failure with hypoxia: Secondary | ICD-10-CM | POA: Diagnosis present

## 2022-05-19 DIAGNOSIS — R197 Diarrhea, unspecified: Secondary | ICD-10-CM | POA: Diagnosis not present

## 2022-05-19 DIAGNOSIS — Z7951 Long term (current) use of inhaled steroids: Secondary | ICD-10-CM

## 2022-05-19 DIAGNOSIS — Z9049 Acquired absence of other specified parts of digestive tract: Secondary | ICD-10-CM

## 2022-05-19 DIAGNOSIS — M069 Rheumatoid arthritis, unspecified: Secondary | ICD-10-CM | POA: Diagnosis present

## 2022-05-19 DIAGNOSIS — I110 Hypertensive heart disease with heart failure: Secondary | ICD-10-CM | POA: Diagnosis present

## 2022-05-19 DIAGNOSIS — A419 Sepsis, unspecified organism: Secondary | ICD-10-CM | POA: Diagnosis not present

## 2022-05-19 DIAGNOSIS — K219 Gastro-esophageal reflux disease without esophagitis: Secondary | ICD-10-CM | POA: Diagnosis present

## 2022-05-19 DIAGNOSIS — J449 Chronic obstructive pulmonary disease, unspecified: Secondary | ICD-10-CM | POA: Diagnosis present

## 2022-05-19 DIAGNOSIS — Z8249 Family history of ischemic heart disease and other diseases of the circulatory system: Secondary | ICD-10-CM

## 2022-05-19 DIAGNOSIS — Z823 Family history of stroke: Secondary | ICD-10-CM

## 2022-05-19 DIAGNOSIS — Y95 Nosocomial condition: Secondary | ICD-10-CM | POA: Diagnosis present

## 2022-05-19 DIAGNOSIS — R7989 Other specified abnormal findings of blood chemistry: Secondary | ICD-10-CM | POA: Diagnosis present

## 2022-05-19 DIAGNOSIS — E876 Hypokalemia: Secondary | ICD-10-CM | POA: Diagnosis present

## 2022-05-19 DIAGNOSIS — I248 Other forms of acute ischemic heart disease: Secondary | ICD-10-CM | POA: Diagnosis present

## 2022-05-19 DIAGNOSIS — R1084 Generalized abdominal pain: Secondary | ICD-10-CM | POA: Diagnosis present

## 2022-05-19 DIAGNOSIS — J690 Pneumonitis due to inhalation of food and vomit: Secondary | ICD-10-CM | POA: Diagnosis present

## 2022-05-19 DIAGNOSIS — J9601 Acute respiratory failure with hypoxia: Secondary | ICD-10-CM | POA: Diagnosis not present

## 2022-05-19 DIAGNOSIS — I480 Paroxysmal atrial fibrillation: Secondary | ICD-10-CM | POA: Diagnosis present

## 2022-05-19 DIAGNOSIS — Z7902 Long term (current) use of antithrombotics/antiplatelets: Secondary | ICD-10-CM

## 2022-05-19 DIAGNOSIS — R6520 Severe sepsis without septic shock: Secondary | ICD-10-CM | POA: Diagnosis present

## 2022-05-19 DIAGNOSIS — Z79899 Other long term (current) drug therapy: Secondary | ICD-10-CM

## 2022-05-19 DIAGNOSIS — I10 Essential (primary) hypertension: Secondary | ICD-10-CM | POA: Diagnosis present

## 2022-05-19 DIAGNOSIS — Z7984 Long term (current) use of oral hypoglycemic drugs: Secondary | ICD-10-CM

## 2022-05-19 DIAGNOSIS — Z885 Allergy status to narcotic agent status: Secondary | ICD-10-CM

## 2022-05-19 DIAGNOSIS — R778 Other specified abnormalities of plasma proteins: Secondary | ICD-10-CM

## 2022-05-19 DIAGNOSIS — E785 Hyperlipidemia, unspecified: Secondary | ICD-10-CM | POA: Diagnosis present

## 2022-05-19 DIAGNOSIS — Z881 Allergy status to other antibiotic agents status: Secondary | ICD-10-CM

## 2022-05-19 DIAGNOSIS — I5032 Chronic diastolic (congestive) heart failure: Secondary | ICD-10-CM | POA: Diagnosis present

## 2022-05-19 DIAGNOSIS — I2510 Atherosclerotic heart disease of native coronary artery without angina pectoris: Secondary | ICD-10-CM | POA: Diagnosis present

## 2022-05-19 DIAGNOSIS — G43909 Migraine, unspecified, not intractable, without status migrainosus: Secondary | ICD-10-CM | POA: Diagnosis present

## 2022-05-19 DIAGNOSIS — J441 Chronic obstructive pulmonary disease with (acute) exacerbation: Secondary | ICD-10-CM

## 2022-05-19 DIAGNOSIS — J9611 Chronic respiratory failure with hypoxia: Secondary | ICD-10-CM

## 2022-05-19 DIAGNOSIS — J439 Emphysema, unspecified: Secondary | ICD-10-CM | POA: Diagnosis present

## 2022-05-19 DIAGNOSIS — Z888 Allergy status to other drugs, medicaments and biological substances status: Secondary | ICD-10-CM

## 2022-05-19 DIAGNOSIS — E119 Type 2 diabetes mellitus without complications: Secondary | ICD-10-CM | POA: Diagnosis present

## 2022-05-19 DIAGNOSIS — J189 Pneumonia, unspecified organism: Secondary | ICD-10-CM | POA: Diagnosis present

## 2022-05-19 DIAGNOSIS — D72829 Elevated white blood cell count, unspecified: Secondary | ICD-10-CM | POA: Diagnosis present

## 2022-05-19 LAB — CBC
HCT: 41.4 % (ref 39.0–52.0)
Hemoglobin: 12.9 g/dL — ABNORMAL LOW (ref 13.0–17.0)
MCH: 28.7 pg (ref 26.0–34.0)
MCHC: 31.2 g/dL (ref 30.0–36.0)
MCV: 92 fL (ref 80.0–100.0)
Platelets: 305 10*3/uL (ref 150–400)
RBC: 4.5 MIL/uL (ref 4.22–5.81)
RDW: 17.2 % — ABNORMAL HIGH (ref 11.5–15.5)
WBC: 26 10*3/uL — ABNORMAL HIGH (ref 4.0–10.5)
nRBC: 0 % (ref 0.0–0.2)

## 2022-05-19 LAB — COMPREHENSIVE METABOLIC PANEL
ALT: 17 U/L (ref 0–44)
AST: 18 U/L (ref 15–41)
Albumin: 3.6 g/dL (ref 3.5–5.0)
Alkaline Phosphatase: 63 U/L (ref 38–126)
Anion gap: 8 (ref 5–15)
BUN: 19 mg/dL (ref 8–23)
CO2: 30 mmol/L (ref 22–32)
Calcium: 8.4 mg/dL — ABNORMAL LOW (ref 8.9–10.3)
Chloride: 98 mmol/L (ref 98–111)
Creatinine, Ser: 1.02 mg/dL (ref 0.61–1.24)
GFR, Estimated: >60 mL/min (ref >60)
Glucose, Bld: 389 mg/dL — ABNORMAL HIGH (ref 70–99)
Potassium: 3.4 mmol/L — ABNORMAL LOW (ref 3.5–5.1)
Sodium: 136 mmol/L (ref 135–145)
Total Bilirubin: 0.5 mg/dL (ref 0.3–1.2)
Total Protein: 6.9 g/dL (ref 6.5–8.1)

## 2022-05-19 LAB — TROPONIN I (HIGH SENSITIVITY)
Troponin I (High Sensitivity): 86 ng/L — ABNORMAL HIGH (ref <18)
Troponin I (High Sensitivity): 71 ng/L — ABNORMAL HIGH (ref <18)

## 2022-05-19 LAB — BRAIN NATRIURETIC PEPTIDE: B Natriuretic Peptide: 190 pg/mL — ABNORMAL HIGH (ref 0.0–100.0)

## 2022-05-19 LAB — PROCALCITONIN: Procalcitonin: 0.14 ng/mL

## 2022-05-19 MED ORDER — FOLIC ACID 1 MG PO TABS
1.0000 mg | ORAL_TABLET | Freq: Every day | ORAL | Status: DC
  Administered 2022-05-20 – 2022-05-24 (×5): 1 mg via ORAL
  Filled 2022-05-19 (×5): qty 1

## 2022-05-19 MED ORDER — MELATONIN 5 MG PO TABS
5.0000 mg | ORAL_TABLET | Freq: Every evening | ORAL | Status: DC | PRN
  Administered 2022-05-20 – 2022-05-22 (×3): 5 mg via ORAL
  Filled 2022-05-19 (×3): qty 1

## 2022-05-19 MED ORDER — METOPROLOL TARTRATE 5 MG/5ML IV SOLN
5.0000 mg | INTRAVENOUS | Status: AC | PRN
  Administered 2022-05-19 – 2022-05-20 (×2): 5 mg via INTRAVENOUS
  Filled 2022-05-19 (×4): qty 5

## 2022-05-19 MED ORDER — DILTIAZEM HCL ER COATED BEADS 120 MG PO CP24
240.0000 mg | ORAL_CAPSULE | Freq: Every day | ORAL | Status: DC
  Administered 2022-05-20 – 2022-05-21 (×2): 240 mg via ORAL
  Filled 2022-05-19: qty 2
  Filled 2022-05-19: qty 1
  Filled 2022-05-19: qty 2
  Filled 2022-05-19 (×2): qty 1

## 2022-05-19 MED ORDER — ONDANSETRON HCL 4 MG PO TABS
4.0000 mg | ORAL_TABLET | Freq: Four times a day (QID) | ORAL | Status: DC | PRN
  Administered 2022-05-22: 4 mg via ORAL
  Filled 2022-05-19: qty 1

## 2022-05-19 MED ORDER — SENNA 8.6 MG PO TABS: 1.0000 | ORAL_TABLET | Freq: Every day | ORAL | Status: DC | PRN

## 2022-05-19 MED ORDER — IPRATROPIUM-ALBUTEROL 0.5-2.5 (3) MG/3ML IN SOLN
3.0000 mL | Freq: Three times a day (TID) | RESPIRATORY_TRACT | Status: AC
Start: 2022-05-19 — End: 2022-05-21
  Administered 2022-05-20 (×2): 3 mL via RESPIRATORY_TRACT
  Filled 2022-05-19 (×2): qty 3

## 2022-05-19 MED ORDER — VANCOMYCIN HCL IN DEXTROSE 1-5 GM/200ML-% IV SOLN
1000.0000 mg | Freq: Once | INTRAVENOUS | Status: AC
  Administered 2022-05-19: 1000 mg via INTRAVENOUS
  Filled 2022-05-19: qty 200

## 2022-05-19 MED ORDER — MOMETASONE FURO-FORMOTEROL FUM 200-5 MCG/ACT IN AERO
2.0000 | INHALATION_SPRAY | Freq: Two times a day (BID) | RESPIRATORY_TRACT | Status: DC
  Administered 2022-05-20 – 2022-05-24 (×8): 2 via RESPIRATORY_TRACT
  Filled 2022-05-19: qty 8.8

## 2022-05-19 MED ORDER — METOPROLOL TARTRATE 5 MG/5ML IV SOLN
5.0000 mg | Freq: Once | INTRAVENOUS | Status: AC
  Administered 2022-05-19: 5 mg via INTRAVENOUS

## 2022-05-19 MED ORDER — POLYETHYLENE GLYCOL 3350 17 G PO PACK: 17.0000 g | PACK | Freq: Two times a day (BID) | ORAL | Status: DC | PRN

## 2022-05-19 MED ORDER — ATORVASTATIN CALCIUM 80 MG PO TABS
80.0000 mg | ORAL_TABLET | Freq: Every day | ORAL | Status: DC
  Administered 2022-05-19 – 2022-05-23 (×5): 80 mg via ORAL
  Filled 2022-05-19: qty 4
  Filled 2022-05-19 (×5): qty 1

## 2022-05-19 MED ORDER — ACETAMINOPHEN 650 MG RE SUPP: 650.0000 mg | Freq: Four times a day (QID) | RECTAL | Status: DC | PRN

## 2022-05-19 MED ORDER — MELATONIN 3 MG PO CAPS: 2.0000 | ORAL_CAPSULE | Freq: Every day | ORAL | Status: DC

## 2022-05-19 MED ORDER — IOHEXOL 9 MG/ML PO SOLN
500.0000 mL | ORAL | Status: AC
  Administered 2022-05-19: 500 mL via ORAL

## 2022-05-19 MED ORDER — METHOTREXATE 2.5 MG PO TABS
10.0000 mg | ORAL_TABLET | ORAL | Status: DC
  Administered 2022-05-21: 10 mg via ORAL
  Filled 2022-05-19: qty 4

## 2022-05-19 MED ORDER — CEFEPIME HCL 2 G IV SOLR
2.0000 g | Freq: Once | INTRAVENOUS | Status: AC
  Administered 2022-05-19: 2 g via INTRAVENOUS
  Filled 2022-05-19: qty 12.5

## 2022-05-19 MED ORDER — GUAIFENESIN ER 600 MG PO TB12: 600.0000 mg | ORAL_TABLET | Freq: Two times a day (BID) | ORAL | Status: DC | PRN

## 2022-05-19 MED ORDER — ONDANSETRON HCL 4 MG/2ML IJ SOLN: 4.0000 mg | Freq: Four times a day (QID) | INTRAMUSCULAR | Status: DC | PRN

## 2022-05-19 MED ORDER — GABAPENTIN 300 MG PO CAPS
300.0000 mg | ORAL_CAPSULE | Freq: Three times a day (TID) | ORAL | Status: DC | PRN
  Administered 2022-05-20 – 2022-05-24 (×5): 300 mg via ORAL
  Filled 2022-05-19 (×5): qty 1

## 2022-05-19 MED ORDER — DABIGATRAN ETEXILATE MESYLATE 150 MG PO CAPS
150.0000 mg | ORAL_CAPSULE | Freq: Two times a day (BID) | ORAL | Status: DC
  Administered 2022-05-20 – 2022-05-24 (×9): 150 mg via ORAL
  Filled 2022-05-19 (×11): qty 1

## 2022-05-19 MED ORDER — IOHEXOL 300 MG/ML  SOLN
100.0000 mL | Freq: Once | INTRAMUSCULAR | Status: AC | PRN
  Administered 2022-05-19: 100 mL via INTRAVENOUS

## 2022-05-19 MED ORDER — DILTIAZEM HCL-DEXTROSE 125-5 MG/125ML-% IV SOLN (PREMIX): 5.0000 mg/h | INTRAVENOUS | Status: DC

## 2022-05-19 MED ORDER — VANCOMYCIN HCL 1750 MG/350ML IV SOLN: 1750.0000 mg | INTRAVENOUS | Status: DC

## 2022-05-19 MED ORDER — DILTIAZEM HCL 25 MG/5ML IV SOLN
10.0000 mg | Freq: Once | INTRAVENOUS | Status: AC
  Administered 2022-05-19: 10 mg via INTRAVENOUS
  Filled 2022-05-19: qty 5

## 2022-05-19 MED ORDER — ISOSORBIDE MONONITRATE ER 60 MG PO TB24
60.0000 mg | ORAL_TABLET | Freq: Every day | ORAL | Status: DC
  Administered 2022-05-20 – 2022-05-24 (×5): 60 mg via ORAL
  Filled 2022-05-19 (×5): qty 1

## 2022-05-19 MED ORDER — SODIUM CHLORIDE 0.9 % IV SOLN
2.0000 g | Freq: Three times a day (TID) | INTRAVENOUS | Status: DC
  Administered 2022-05-20: 2 g via INTRAVENOUS
  Filled 2022-05-19: qty 12.5

## 2022-05-19 MED ORDER — KETOROLAC TROMETHAMINE 30 MG/ML IJ SOLN
15.0000 mg | Freq: Once | INTRAMUSCULAR | Status: AC
  Administered 2022-05-20: 15 mg via INTRAVENOUS
  Filled 2022-05-19: qty 1

## 2022-05-19 MED ORDER — BUSPIRONE HCL 10 MG PO TABS
10.0000 mg | ORAL_TABLET | Freq: Two times a day (BID) | ORAL | Status: DC
  Administered 2022-05-20 – 2022-05-24 (×9): 10 mg via ORAL
  Filled 2022-05-19 (×5): qty 1
  Filled 2022-05-19: qty 2
  Filled 2022-05-19 (×3): qty 1

## 2022-05-19 MED ORDER — ENOXAPARIN SODIUM 40 MG/0.4ML IJ SOSY
40.0000 mg | PREFILLED_SYRINGE | INTRAMUSCULAR | Status: DC
  Administered 2022-05-19: 40 mg via SUBCUTANEOUS
  Filled 2022-05-19: qty 0.4

## 2022-05-19 MED ORDER — HYDROXYZINE HCL 25 MG PO TABS: 25.0000 mg | ORAL_TABLET | Freq: Four times a day (QID) | ORAL | Status: DC | PRN

## 2022-05-19 MED ORDER — IPRATROPIUM-ALBUTEROL 0.5-2.5 (3) MG/3ML IN SOLN
3.0000 mL | Freq: Once | RESPIRATORY_TRACT | Status: AC
  Administered 2022-05-19: 3 mL via RESPIRATORY_TRACT
  Filled 2022-05-19: qty 3

## 2022-05-19 MED ORDER — METFORMIN HCL ER 500 MG PO TB24: 500.0000 mg | ORAL_TABLET | Freq: Two times a day (BID) | ORAL | Status: DC

## 2022-05-19 MED ORDER — DILTIAZEM HCL 25 MG/5ML IV SOLN
10.0000 mg | Freq: Once | INTRAVENOUS | Status: AC
  Administered 2022-05-20: 10 mg via INTRAVENOUS
  Filled 2022-05-19: qty 5

## 2022-05-19 MED ORDER — METOPROLOL TARTRATE 50 MG PO TABS
50.0000 mg | ORAL_TABLET | Freq: Two times a day (BID) | ORAL | Status: DC
  Administered 2022-05-19 – 2022-05-22 (×6): 50 mg via ORAL
  Filled 2022-05-19 (×6): qty 1

## 2022-05-19 MED ORDER — ASPIRIN 81 MG PO TBEC
81.0000 mg | DELAYED_RELEASE_TABLET | Freq: Every day | ORAL | Status: DC
  Administered 2022-05-20 – 2022-05-24 (×5): 81 mg via ORAL
  Filled 2022-05-19 (×5): qty 1

## 2022-05-19 MED ORDER — LOSARTAN POTASSIUM 50 MG PO TABS: 25.0000 mg | ORAL_TABLET | Freq: Two times a day (BID) | ORAL | Status: DC

## 2022-05-19 MED ORDER — POLYETHYLENE GLYCOL 3350 17 G PO PACK: 17.0000 g | PACK | Freq: Every day | ORAL | Status: DC | PRN

## 2022-05-19 MED ORDER — ACETAMINOPHEN 325 MG PO TABS
650.0000 mg | ORAL_TABLET | Freq: Four times a day (QID) | ORAL | Status: DC | PRN
  Administered 2022-05-19 – 2022-05-22 (×5): 650 mg via ORAL
  Filled 2022-05-19 (×6): qty 2

## 2022-05-19 NOTE — Assessment & Plan Note (Addendum)
-   Patient takes methotrexate 10 mg by mouth once per week, next scheduled is 05/21/2022

## 2022-05-19 NOTE — ED Provider Notes (Signed)
[facility] Provider Note    Event Date/Time   First MD Initiated Contact with Patient 05/19/22 1647     (approximate)  History   Chief Complaint: Shortness of Breath (Copd flare up, sob, solumedrol 125 , douneb x 1, 2 grams mag given by ems )  HPI  Darius Norris is a 71 y.o. male with a past medical history of CAD, A-fib, COPD, hypertension, gastric reflux, presents to the emergency department for shortness of breath.  According to the patient and record review he was discharged yesterday from the hospital after an admission for shortness of breath proximal atrial fibrillation and COPD.  Patient states since going home his shortness of breath has worsened throughout the night and into today.  Patient denies any chest pain.  No leg pain or swelling.  Patient appears to be in atrial fibrillation currently with a rate around 110 bpm.  Patient states he typically wears 3 L of oxygen at home however he has recently increase this to 5 L since going home from the hospital yesterday.  States slight cough.  No fever.  Physical Exam   Triage Vital Signs: ED Triage Vitals [05/19/22 1645]  Enc Vitals Group     BP (!) 157/87     Pulse Rate (!) 110     Resp (!) 25     Temp 98.7 F (37.1 C)     Temp Source Oral     SpO2 96 %     Weight 187 lb 6.3 oz (85 kg)     Height '5\' 9"'$  (1.753 m)     Head Circumference      Peak Flow      Pain Score 3     Pain Loc      Pain Edu?      Excl. in [place]?     Most recent vital signs: Vitals:   05/19/22 1645  BP: (!) 157/87  Pulse: (!) 110  Resp: (!) 25  Temp: 98.7 F (37.1 C)  SpO2: 96%    General: Awake, no distress.  CV:  Good peripheral perfusion.  Regular rate and rhythm  Resp:  Mild tachypnea around 25 breaths/min with slightly increased work of breathing.  Diminished breath sounds bilaterally with slight expiratory wheeze.  No obvious rhonchi. Abd:  No distention.  Soft, nontender.  No rebound or guarding. Other:  No  lower extremity edema or tenderness to palpation.    ED Results / Procedures / Treatments   EKG  EKG viewed and interpreted by myself appear to show atrial fibrillation with rapid ventricular response at 114 bpm with a narrow QRS, normal axis, largely normal intervals with nonspecific ST changes.  No ST elevation.  RADIOLOGY  I have evaluated the chest x-ray images patient does appear to have some haziness in the bilateral bases. Radiology is read the chest x-ray as patchy opacities in the lung bases concerning for pneumonia.   MEDICATIONS ORDERED IN ED: Medications - No data to display   IMPRESSION / MDM / [place] / ED COURSE  I reviewed the triage vital signs and the nursing notes.  Patient's presentation is most consistent with acute presentation with potential threat to life or bodily function.  Patient presents emergency department for shortness of breath worsening since going home from the hospital yesterday.  I reviewed the patient's recent discharge summary he was admitted for paroxysmal atrial fibrillation with rapid ventricular response as well as COPD exacerbation.  Patient received 125  mg of Solu-Medrol, magnesium, 1 DuoNeb and 1 albuterol nebulizer in route to the hospital by EMS.  Continues to have diminished breath sounds with mild tachypnea and increased work of breathing.  We will dose 2 additional DuoNebs and continue to closely monitor.  We will check labs including cardiac enzymes, chest x-ray.  Differential this time would include COPD exacerbation, atrial fibrillation with rapid ventricular response, ACS, CHF, pneumonia.  Patient's labs have resulted showing significant leukocytosis of 26,000 likely related to the patient's significant steroid use recently.  Patient's chemistry shows no significant findings besides hyperglycemia which could again be related to steroid use.  Patient's troponin is elevated at 86, historical values slightly elevated as well.   BNP is elevated at 190.  Patient's chest x-ray is concerning for pneumonia that was not present during the patient's last hospitalization.  We will cover for HCAP.  Patient meets sepsis criteria given as elevated heart rate and white blood cell count.  He is in A-fib RVR we will dose 10 mg of IV diltiazem.  We will admit to the hospitalist service for ongoing treatment.  CRITICAL CARE Performed by: Harvest Dark   Total critical care time: 30 minutes  Critical care time was exclusive of separately billable procedures and treating other patients.  Critical care was necessary to treat or prevent imminent or life-threatening deterioration.  Critical care was time spent personally by me on the following activities: development of treatment plan with patient and/or surrogate as well as nursing, discussions with consultants, evaluation of patient's response to treatment, examination of patient, obtaining history from patient or surrogate, ordering and performing treatments and interventions, ordering and review of laboratory studies, ordering and review of radiographic studies, pulse oximetry and re-evaluation of patient's condition.   FINAL CLINICAL IMPRESSION(S) / ED DIAGNOSES   Dyspnea COPD exacerbation HCAP Atrial fibrillation with rapid ventricular response  Note:  This document was prepared using Dragon voice recognition software and may include unintentional dictation errors.   Harvest Dark, MD 05/19/22 Bosie Helper

## 2022-05-19 NOTE — Assessment & Plan Note (Signed)
-   Presumed secondary to acute on chronic hypoxia due to pneumonia

## 2022-05-19 NOTE — H&P (Signed)
History and Physical   [name]:[id number] DOB: Feb 24, 1951 DOA: 05/19/2022  [facility]P: [facility] [place]  Outpatient Specialists: Dr. Leanna Sato clinic cardiology Patient coming from: Home via EMS  I have personally briefly reviewed patient's old medical records in [place].  Chief Concern: Shortness of breath  HPI: [name] is a 71 year old male with history of COPD with chronic respiratory failure on 3 L nasal cannula continuously, atrial fibrillation, CAD, hypertension, GERD, who presented to the emergency department via EMS for chief concerns of shortness of breath.  Initial vitals in the emergency department showed temperature of 98.7, respiration rate of 25, heart rate of 110, blood pressure 157/87, SPO2 of 96% on 5 L nasal cannula.  Serum sodium is 136, potassium 3.4, chloride 98, bicarb 30, BUN of 19, serum creatinine 1.02, GFR greater than 60, nonfasting blood glucose 389, WBC 26, hemoglobin 12.9, platelets of 305, BNP elevated at 190, high sensitive troponin was 86.  Portable chest x-ray ordered by EDP was read as: Progressive patchy opacity at the lung bases, suspicious for pneumonia.  Given distribution, aspiration is considered.  Chronic hyperinflation advanced emphysema.  Per ED nursing report: EMS gave patient Solu-Medrol 125 mg, DuoNebs one-time dose, 2 g of magnesium  ED treatment: DuoNeb's x2, diltiazem 10 mg IV, cefepime IV, vancomycin IV.  He is AAOx3.  He is able to identify his wife, Darius Norris at bedside, with patient's permission.   He reports that he was breathing a little bit better on 05/18/2022.  However he was still short of breath but thought that he could go home.  He denies nausea, vomitting, chest pain, known fever, new dysuria, hematuria.  He endorses general abdominal pain.  He endorses diarrhea, 3 episodes since being discharged from the hospital.  He states that the diarrhea is watery, projectile and brown.  Social  history: He lives with his wife, Darius Norris. He is a former tobacco user (1.5 ppd), quitting about 25 years ago. He denies etoh and recreational drug use. He is retired and formerly worked at [employer] in [place].   ROS: Constitutional: no weight change, no fever ENT/Mouth: no sore throat, no rhinorrhea Eyes: no eye pain, no vision changes Cardiovascular: no chest pain, + dyspnea,  no edema, no palpitations Respiratory: + cough, no sputum, no wheezing Gastrointestinal: no nausea, no vomiting, no diarrhea, no constipation Genitourinary: no urinary incontinence, no dysuria, no hematuria Musculoskeletal: no arthralgias, no myalgias Skin: no skin lesions, no pruritus, Neuro: + weakness, no loss of consciousness, no syncope Psych: no anxiety, no depression, + decrease appetite Heme/Lymph: no bruising, no bleeding  ED Course: Discussed with emergency medicine provider, patient requiring hospitalization for chief concerns of shortness of breath possibly secondary to HCAP versus aspiration pneumonia.  Assessment/Plan  Principal Problem:   Aspiration pneumonia (HCC) Active Problems:   HTN (hypertension)   GERD (gastroesophageal reflux disease)   Elevated troponin   Leukocytosis   Acute hypoxemic respiratory failure (HCC)   Atrial fibrillation with RVR (HCC)   HLD (hyperlipidemia)   Migraine   Rheumatoid arthritis (HCC)   Severe sepsis (HCC)   Diarrhea   Assessment and Plan:  * Aspiration pneumonia (HCC) - Check MRSA PCR, 20 pathogen respiratory panel, Legionella urine antigen - Cefepime and vancomycin IV - Add azithromycin for atypical coverage - Continue oxygen supplementation to maintain SPO2 greater than 92% - Admit to progressive cardiac, observation  HTN (hypertension) - Losartan 25 mg twice daily, isosorbide mononitrate 60 mg  daily, diltiazem 180 mg daily, metoprolol 50 mg twice daily resumed  Diarrhea - Three watery brown projectile episodes of diarrhea  since discharge from hospital  Severe sepsis [facility]) - Patient meets the definition of sepsis with increased respiration rate, heart rate, leukocytosis, source of aspiration/HCAP pneumonia - The leukocytosis may have demargination due to recent steroid use from hospitalization  Rheumatoid arthritis ([place]) - Patient takes methotrexate 10 mg by mouth once per week, next scheduled is 05/21/2022  HLD (hyperlipidemia) - Atorvastatin 80 mg nightly resumed  Atrial fibrillation with RVR (HCC) - Currently controlled, presumed secondary to hospital-acquired pneumonia versus aspiration pneumonia - Metoprolol tartrate injection 5 mg IV every 2 hours as needed for heart rate greater than 120, 3 doses ordered with instructions to administer and then let provider know  Acute hypoxemic respiratory failure ([place]) - Presumed secondary to pneumonia concerning for aspiration versus hospital-acquired - Treat per pneumonia - Continue 5 L nasal cannula to maintain SPO2 greater than 92%  Leukocytosis - Possible multifactorial including demargination in setting of recent steroid use from hospitalization versus worsening pneumonia  Elevated troponin - Presumed secondary to acute on chronic hypoxia due to pneumonia  Chart reviewed.   Hospitalization from 05/16/2022 to 05/18/2022: For atrial fibrillation with RVR.  Cardiology was consulted and cleared patient for discharge.  Patient was also found to have acute on chronic respiratory failure with hypoxemia presumed secondary to COPD exacerbation.  Patient was given oral Lasix he was also given 3 additional days of cefdinir and steroid taper on discharge.  DVT prophylaxis: Enoxaparin Code Status: full code  Diet: Heart healthy/carb modified Family Communication:Updated spouse at bedside, with patient's permission Disposition Plan: Pending clinical course Consults called: None at this time Admission status: Progressive, observation  Past Medical History:  Diagnosis  Date   A-fib ([place])    CAD (coronary artery disease)    Colitis    COPD (chronic obstructive pulmonary disease) (HCC)    Emphysema lung (HCC)    GERD (gastroesophageal reflux disease)    Hypertension    MI (myocardial infarction) ([place]) 1999   Past Surgical History:  Procedure Laterality Date   ABDOMINAL HERNIA REPAIR Bilateral    CHOLECYSTECTOMY     COLONOSCOPY WITH PROPOFOL N/A 02/01/2018   Procedure: COLONOSCOPY WITH PROPOFOL;  Surgeon: Jonathon Bellows, MD;  Location: [facility] ENDOSCOPY;  Service: Gastroenterology;  Laterality: N/A;   KNEE ARTHROSCOPY Bilateral    Social History:  reports that he quit smoking about 20 years ago. His smoking use included cigarettes. He has a 30.00 pack-year smoking history. He has never used smokeless tobacco. He reports that he does not drink alcohol and does not use drugs.  Allergies  Allergen Reactions   Ranitidine Hcl Shortness Of Breath   Zantac [Ranitidine Hcl] Shortness Of Breath   Codeine Itching    Other reaction(s): PRURITIS   Erythromycin Hives    Other reaction(s): SWELLING (NON-SPECIFIC), HIVES, SWELLING (NON-SPECIFIC), HIVES   Hydrocodone     Other reaction(s): PRURITIS   Morphine Itching   Morphine And Related Itching   Tiotropium Bromide Monohydrate    Hydrocodone-Acetaminophen Rash   Vicodin [Hydrocodone-Acetaminophen] Rash   Zantac [Ranitidine] Rash    Other reaction(s): Eruption, HIVES, SHORTNESS OF BREATH, Eruption, HIVES, SHORTNESS OF BREATH   Family History  Problem Relation Age of Onset   CAD Father    Stroke Father    CAD Sister    Stroke Brother    Family history: Family history reviewed and not pertinent.  Prior to Admission  medications   Medication Sig Start Date End Date Taking? Authorizing Provider  acetaminophen (TYLENOL) 325 MG tablet Take 650 mg by mouth every 6 (six) hours as needed.    [provider]  albuterol (VENTOLIN HFA) 108 (90 Base) MCG/ACT inhaler Inhale into the lungs. 03/30/21   [provider]  aspirin EC 81 MG EC tablet Take 1 tablet (81 mg total) by mouth daily. 06/09/18   [name], Avel Peace, MD  atorvastatin (LIPITOR) 80 MG tablet Take 1 tablet (80 mg total) by mouth at bedtime. 11/18/17   Vaughan Basta, MD  budesonide-formoterol (SYMBICORT) 160-4.5 MCG/ACT inhaler Inhale 2 puffs into the lungs 2 (two) times daily.    [provider]  busPIRone (BUSPAR) 10 MG tablet Take by mouth.    [provider]  cefdinir (OMNICEF) 300 MG capsule Take 1 capsule (300 mg total) by mouth 2 (two) times daily for 3 days. 05/18/22 05/21/22  Sharen Hones, MD  clobetasol (TEMOVATE) 0.05 % external solution Apply topically. 02/28/21   [provider]  dabigatran (PRADAXA) 150 MG CAPS capsule Take 150 mg by mouth 2 (two) times daily.    [provider]  diltiazem (CARDIZEM CD) 180 MG 24 hr capsule Take 1 capsule (180 mg total) by mouth daily. Patient taking differently: Take 240 mg by mouth daily. 06/09/18   [name], Avel Peace, MD  folic acid (FOLVITE) 1 MG tablet Take by mouth. 03/30/21   [provider]  gabapentin (NEURONTIN) 300 MG capsule Take 1 capsule by mouth 3 (three) times daily as needed. 10/10/21   [provider]  guaifenesin (HUMIBID E) 400 MG TABS tablet Take by mouth. 03/30/21   [provider]  hydrocortisone 2.5 % cream Apply topically. 02/28/21   [provider]  hydrOXYzine (ATARAX/VISTARIL) 25 MG tablet Take by mouth.    [provider]  Ipratropium-Albuterol (COMBIVENT) 20-100 MCG/ACT AERS respimat Inhale 1 puff into the lungs every 6 (six) hours as needed for wheezing or shortness of breath.    [provider]  isosorbide mononitrate (IMDUR) 60 MG 24 hr tablet Take 60 mg by mouth daily.    [provider]  ketorolac (ACULAR) 0.5 % ophthalmic solution INSTILL 1 DROP OPERATIVE EYE FOUR TIMES A DAY FOR POSTOPERATIVE PAIN/INFLAMMATION SEND TO 4B AMB SURGERY OR ON DAY OF SURGERY  04/05/22 03/29/22   [provider]  lidocaine (XYLOCAINE) 5 % ointment Apply topically. 03/30/21   [provider]  loratadine (CLARITIN) 10 MG tablet Take 10 mg by mouth daily as needed for allergies.     [provider]  losartan (COZAAR) 25 MG tablet Take 1 tablet by mouth 2 (two) times daily. 03/04/22   [provider]  Melatonin 3 MG CAPS Take 2 capsules by mouth at bedtime. 10/10/21   [provider]  metFORMIN (GLUCOPHAGE-XR) 500 MG 24 hr tablet Take 500 mg by mouth in the morning and at bedtime. 03/30/21   [provider]  methotrexate (RHEUMATREX) 2.5 MG tablet Take 10 mg by mouth once a week. 03/02/21   [provider]  metoprolol tartrate (LOPRESSOR) 50 MG tablet Take by mouth. 02/14/19   [provider]  mometasone [facility]) 220 MCG/INH inhaler Take by mouth. 03/10/21   [provider]  moxifloxacin (VIGAMOX) 0.5 % ophthalmic solution INSTILL 1 DROP IN LEFT EYE FOUR TIMES A DAY FOR CATARACT SURGERY SEND TO 4B AMB SURGERY OR ON DAY OF SURGERY 04/05/22 03/29/22   [provider]  predniSONE (DELTASONE)  20 MG tablet Take 1 tablet (20 mg total) by mouth daily with breakfast for 3 days, THEN 0.5 tablets (10 mg total) daily with breakfast for 3 days. 05/19/22 05/25/22  Sharen Hones, MD  senna (SENOKOT) 8.6 MG TABS tablet Take 1 tablet by mouth daily as needed for mild constipation.    [provider]  simethicone (MYLICON) 80 MG chewable tablet CHEW ONE TABLET BY MOUTH THREE TIMES A DAY AFTER MEALS AS NEEDED FOR GAS 03/04/22   [provider]   Physical Exam: Vitals:   05/19/22 1645 05/19/22 1925  BP: (!) 157/87 (!) 146/88  Norris: (!) 110 (!) 109  Resp: (!) 25 20  Temp: 98.7 F (37.1 C)   TempSrc: Oral   SpO2: 96% 93%  Weight: 85 kg   Height: '5\' 9"'$  (1.753 m)    Constitutional: appears age-appropriate, NAD, calm, comfortable Eyes: PERRL, lids and conjunctivae normal ENMT: Mucous membranes  are moist. Posterior pharynx clear of any exudate or lesions. Age-appropriate dentition. Hearing appropriate Neck: normal, supple, no masses, no thyromegaly Respiratory: Decreased auscultation at the right lower lobe, no wheezing, no crackles. Normal respiratory effort. No accessory muscle use.  Cardiovascular: Regular rate and rhythm, no murmurs / rubs / gallops. No extremity edema. 2+ pedal pulses. No carotid bruits.  Abdomen: Obese abdomen, no tenderness, no masses palpated, no hepatosplenomegaly. Bowel sounds positive.  Musculoskeletal: no clubbing / cyanosis. No joint deformity upper and lower extremities. Good ROM, no contractures, no atrophy. Normal muscle tone.  Skin: no rashes, lesions, ulcers. No induration Neurologic: Sensation intact. Strength 5/5 in all 4.  Psychiatric: Normal judgment and insight. Alert and oriented x 3. Normal mood.   EKG: independently reviewed, showing sinus rhythm with rate of 72, QTc 457, LVH  Chest x-ray on Admission: I personally reviewed and I agree with radiologist reading as below.  DG Chest Portable 1 View  Result Date: 05/19/2022 CLINICAL DATA:  Shortness of breath. Patient reports COPD exacerbation with progressive shortness of breath EXAM: PORTABLE CHEST 1 VIEW COMPARISON:  Radiograph 05/16/2022 FINDINGS: Chronic hyperinflation with advanced emphysema. Progressive patchy opacity in both lung bases. Stable heart size and mediastinal contours. No pneumothorax or large pleural effusion. Stable osseous structures. IMPRESSION: 1. Progressive patchy opacity in the lung bases, suspicious for pneumonia. Given distribution, aspiration is considered. Recommend aspiration risk factor assessment. 2. Chronic hyperinflation and advanced emphysema. Electronically Signed   By: Keith Rake M.D.   On: 05/19/2022 17:54    Labs on Admission: I have personally reviewed following labs  CBC: Recent Labs  Lab 05/16/22 0954 05/17/22 0641 05/19/22 1730  WBC 15.8* 7.7  26.0*  NEUTROABS 13.1*  --   --   HGB 13.2 11.8* 12.9*  HCT 42.0 37.2* 41.4  MCV 91.5 90.3 92.0  PLT 234 209 263   Basic Metabolic Panel: Recent Labs  Lab 05/16/22 0954 05/16/22 1203 05/17/22 0641 05/18/22 0619 05/19/22 1730  NA 136  --  135 140 136  K 3.3*  --  4.0 4.1 3.4*  CL 98  --  97* 102 98  CO2 28  --  30 32 30  GLUCOSE 144*  --  140* 139* 389*  BUN 13  --  '18 20 19  '$ CREATININE 0.72  --  0.74 0.83 1.02  CALCIUM 8.6*  --  8.6* 9.2 8.4*  MG  --  1.7  --  2.2  --    GFR: Estimated Creatinine Clearance: 72.8 mL/min (by C-G formula based on SCr of 1.02  mg/dL).  Liver Function Tests: Recent Labs  Lab 05/16/22 0954 05/19/22 1730  AST 15 18  ALT 18 17  ALKPHOS 67 63  BILITOT 1.0 0.5  PROT 6.6 6.9  ALBUMIN 3.6 3.6   CBG: Recent Labs  Lab 05/17/22 0741 05/17/22 1125 05/17/22 1709 05/17/22 2136 05/18/22 0801  GLUCAP 139* 107* 204* 155* 129*   Urine analysis:    Component Value Date/Time   COLORURINE YELLOW (A) 12/10/2017 2248   APPEARANCEUR CLEAR (A) 12/10/2017 2248   APPEARANCEUR Clear 02/04/2013 1430   LABSPEC 1.009 12/10/2017 2248   LABSPEC 1.004 02/04/2013 1430   PHURINE 6.0 12/10/2017 2248   GLUCOSEU NEGATIVE 12/10/2017 2248   GLUCOSEU Negative 02/04/2013 1430   HGBUR NEGATIVE 12/10/2017 2248   BILIRUBINUR NEGATIVE 12/10/2017 2248   BILIRUBINUR Negative 02/04/2013 1430   KETONESUR NEGATIVE 12/10/2017 2248   PROTEINUR NEGATIVE 12/10/2017 2248   NITRITE NEGATIVE 12/10/2017 2248   LEUKOCYTESUR NEGATIVE 12/10/2017 2248   LEUKOCYTESUR Negative 02/04/2013 1430   Dr. Tobie Poet [facility]  If 7PM-7AM, please contact overnight-coverage provider If 7AM-7PM, please contact day coverage provider [email or web address]  05/19/2022, 7:39 PM

## 2022-05-19 NOTE — Consult Note (Signed)
Pharmacy Antibiotic Note  Darius Norris is a 71 y.o. male admitted on 05/19/2022 with pneumonia.  Pharmacy has been consulted for vancomycin and cefepime dosing.  Plan: Vancomycin 1000 mg given in ED, will order an additional 1000 mg for a total loading dose of 2000 mg followed by vancomycin 1750 mg IV q24h Goal AUC 400-550  Est AUC: 474 Est Cmax: 37.4 Est Cmin: 9.9 Calculated with SCr 1.02  Cefepime 2 g IV q8h per indication and renal function  Monitor clinical picture, renal function, and vancomycin levels at steady state F/U C&S, abx deescalation / LOT   Height: '5\' 9"'$  (175.3 cm) Weight: 85 kg (187 lb 6.3 oz) IBW/kg (Calculated) : 70.7  Temp (24hrs), Avg:98.7 F (37.1 C), Min:98.7 F (37.1 C), Max:98.7 F (37.1 C)  Recent Labs  Lab 05/16/22 0954 05/17/22 0641 05/18/22 0619 05/19/22 1730  WBC 15.8* 7.7  --  26.0*  CREATININE 0.72 0.74 0.83 1.02  LATICACIDVEN 1.3  --   --   --     Estimated Creatinine Clearance: 72.8 mL/min (by C-G formula based on SCr of 1.02 mg/dL).    Allergies  Allergen Reactions   Ranitidine Hcl Shortness Of Breath   Zantac [Ranitidine Hcl] Shortness Of Breath   Codeine Itching    Other reaction(s): PRURITIS   Erythromycin Hives    Other reaction(s): SWELLING (NON-SPECIFIC), HIVES, SWELLING (NON-SPECIFIC), HIVES   Hydrocodone     Other reaction(s): PRURITIS   Morphine Itching   Morphine And Related Itching   Tiotropium Bromide Monohydrate    Hydrocodone-Acetaminophen Rash   Vicodin [Hydrocodone-Acetaminophen] Rash   Zantac [Ranitidine] Rash    Other reaction(s): Eruption, HIVES, SHORTNESS OF BREATH, Eruption, HIVES, SHORTNESS OF BREATH    Antimicrobials this admission: vancomycin 6/2 >>  cefepime 6/2 >>   Dose adjustments this admission:   Microbiology results: 6/2 BCx: pending 6/2 RVP: pending 6/2 MRSA PCR: pending  Thank you for allowing pharmacy to be a part of this patient's care.  Darnelle Bos, PharmD 05/19/2022 6:58  PM

## 2022-05-19 NOTE — Assessment & Plan Note (Addendum)
-   Presumed secondary to pneumonia concerning for aspiration versus hospital-acquired - Treat per pneumonia - Continue 5 L nasal cannula to maintain SPO2 greater than 92%

## 2022-05-19 NOTE — Assessment & Plan Note (Signed)
-   Three watery brown projectile episodes of diarrhea since discharge from hospital

## 2022-05-19 NOTE — Assessment & Plan Note (Addendum)
-   Check MRSA PCR, 20 pathogen respiratory panel, Legionella urine antigen - Cefepime and vancomycin IV - Add azithromycin for atypical coverage - Continue oxygen supplementation to maintain SPO2 greater than 92% - Admit to progressive cardiac, observation

## 2022-05-19 NOTE — Hospital Course (Addendum)
[name] is a 71 year old male with history of COPD with chronic respiratory failure on 3 L nasal cannula continuously, atrial fibrillation, CAD, hypertension, GERD, who presented to the emergency department via EMS for chief concerns of shortness of breath.  Patient woke up in the morning, did not feel well, had a worsening short of breath and palpitation. Initial vitals in the emergency department showed temperature of 98.7, respiration rate of 25, heart rate of 110, blood pressure 157/87, SPO2 of 96% on 5 L nasal cannula. Chest x-ray and a CT scan showed bilateral lower lobe patchy infiltrates.  Consistent with aspiration pneumonia.  Patient antibiotic was changed to Unasyn. Patient also had atrial fibrillation with RVR. Patient had recurrent RVR in the hospital, mostly in the early morning.  Consider aspiration due to as reflex versus CHF.  He was given IV Lasix yesterday, treated with antibiotics.  No meal after 6 PM.  PPI.  He did not have any problem last night, we will keep patient for another day, potentially can discharge home tomorrow if no recurrence of short of breath/tachycardia in early morning.

## 2022-05-19 NOTE — Assessment & Plan Note (Signed)
-   Atorvastatin 80 mg nightly resumed ?

## 2022-05-19 NOTE — Assessment & Plan Note (Signed)
-   Patient meets the definition of sepsis with increased respiration rate, heart rate, leukocytosis, source of aspiration/HCAP pneumonia - The leukocytosis may have demargination due to recent steroid use from hospitalization

## 2022-05-19 NOTE — ED Notes (Signed)
Dr. Tobie Poet at the bedside.

## 2022-05-19 NOTE — ED Triage Notes (Signed)
Copd flare up, sob, solumedrol 125 , douneb x 1, 2 grams mag given by ems

## 2022-05-19 NOTE — ED Notes (Addendum)
Pt provided a dinner tray by dietary but declined the tray.

## 2022-05-19 NOTE — Assessment & Plan Note (Addendum)
-   Losartan 25 mg twice daily, isosorbide mononitrate 60 mg daily, diltiazem 180 mg daily, metoprolol 50 mg twice daily resumed

## 2022-05-19 NOTE — ED Notes (Signed)
Report given to Lindsay, RN

## 2022-05-19 NOTE — Assessment & Plan Note (Addendum)
-   Currently controlled, presumed secondary to hospital-acquired pneumonia versus aspiration pneumonia - Metoprolol tartrate injection 5 mg IV every 2 hours as needed for heart rate greater than 120, 3 doses ordered with instructions to administer and then let provider know

## 2022-05-19 NOTE — Assessment & Plan Note (Signed)
-   Possible multifactorial including demargination in setting of recent steroid use from hospitalization versus worsening pneumonia

## 2022-05-20 DIAGNOSIS — E876 Hypokalemia: Secondary | ICD-10-CM | POA: Diagnosis present

## 2022-05-20 DIAGNOSIS — I480 Paroxysmal atrial fibrillation: Secondary | ICD-10-CM | POA: Diagnosis present

## 2022-05-20 DIAGNOSIS — I5032 Chronic diastolic (congestive) heart failure: Secondary | ICD-10-CM | POA: Diagnosis present

## 2022-05-20 DIAGNOSIS — E875 Hyperkalemia: Secondary | ICD-10-CM | POA: Diagnosis not present

## 2022-05-20 DIAGNOSIS — E785 Hyperlipidemia, unspecified: Secondary | ICD-10-CM | POA: Diagnosis present

## 2022-05-20 DIAGNOSIS — I110 Hypertensive heart disease with heart failure: Secondary | ICD-10-CM | POA: Diagnosis present

## 2022-05-20 DIAGNOSIS — Z87891 Personal history of nicotine dependence: Secondary | ICD-10-CM | POA: Diagnosis not present

## 2022-05-20 DIAGNOSIS — Y95 Nosocomial condition: Secondary | ICD-10-CM | POA: Diagnosis present

## 2022-05-20 DIAGNOSIS — Z9981 Dependence on supplemental oxygen: Secondary | ICD-10-CM | POA: Diagnosis not present

## 2022-05-20 DIAGNOSIS — I2510 Atherosclerotic heart disease of native coronary artery without angina pectoris: Secondary | ICD-10-CM | POA: Diagnosis present

## 2022-05-20 DIAGNOSIS — Z823 Family history of stroke: Secondary | ICD-10-CM | POA: Diagnosis not present

## 2022-05-20 DIAGNOSIS — I252 Old myocardial infarction: Secondary | ICD-10-CM | POA: Diagnosis not present

## 2022-05-20 DIAGNOSIS — J439 Emphysema, unspecified: Secondary | ICD-10-CM | POA: Diagnosis present

## 2022-05-20 DIAGNOSIS — Z79899 Other long term (current) drug therapy: Secondary | ICD-10-CM | POA: Diagnosis not present

## 2022-05-20 DIAGNOSIS — J9621 Acute and chronic respiratory failure with hypoxia: Secondary | ICD-10-CM

## 2022-05-20 DIAGNOSIS — M069 Rheumatoid arthritis, unspecified: Secondary | ICD-10-CM | POA: Diagnosis present

## 2022-05-20 DIAGNOSIS — J189 Pneumonia, unspecified organism: Secondary | ICD-10-CM | POA: Diagnosis present

## 2022-05-20 DIAGNOSIS — A419 Sepsis, unspecified organism: Secondary | ICD-10-CM | POA: Diagnosis present

## 2022-05-20 DIAGNOSIS — J690 Pneumonitis due to inhalation of food and vomit: Secondary | ICD-10-CM | POA: Diagnosis present

## 2022-05-20 DIAGNOSIS — I4891 Unspecified atrial fibrillation: Secondary | ICD-10-CM | POA: Diagnosis not present

## 2022-05-20 DIAGNOSIS — R6520 Severe sepsis without septic shock: Secondary | ICD-10-CM | POA: Diagnosis present

## 2022-05-20 DIAGNOSIS — E119 Type 2 diabetes mellitus without complications: Secondary | ICD-10-CM | POA: Diagnosis present

## 2022-05-20 DIAGNOSIS — Z8249 Family history of ischemic heart disease and other diseases of the circulatory system: Secondary | ICD-10-CM | POA: Diagnosis not present

## 2022-05-20 DIAGNOSIS — K219 Gastro-esophageal reflux disease without esophagitis: Secondary | ICD-10-CM | POA: Diagnosis present

## 2022-05-20 DIAGNOSIS — G43909 Migraine, unspecified, not intractable, without status migrainosus: Secondary | ICD-10-CM | POA: Diagnosis present

## 2022-05-20 DIAGNOSIS — I248 Other forms of acute ischemic heart disease: Secondary | ICD-10-CM | POA: Diagnosis present

## 2022-05-20 LAB — RESPIRATORY PANEL BY PCR

## 2022-05-20 LAB — GASTROINTESTINAL PANEL BY PCR, STOOL (REPLACES STOOL CULTURE)

## 2022-05-20 LAB — BASIC METABOLIC PANEL
Anion gap: 8 (ref 5–15)
BUN: 14 mg/dL (ref 8–23)
CO2: 31 mmol/L (ref 22–32)
Calcium: 8.8 mg/dL — ABNORMAL LOW (ref 8.9–10.3)
Chloride: 102 mmol/L (ref 98–111)
Creatinine, Ser: 0.97 mg/dL (ref 0.61–1.24)
GFR, Estimated: >60 mL/min (ref >60)
Glucose, Bld: 259 mg/dL — ABNORMAL HIGH (ref 70–99)
Potassium: 4.3 mmol/L (ref 3.5–5.1)
Sodium: 141 mmol/L (ref 135–145)

## 2022-05-20 LAB — PROTIME-INR
INR: 1.4 — ABNORMAL HIGH (ref 0.8–1.2)
Prothrombin Time: 16.7 seconds — ABNORMAL HIGH (ref 11.4–15.2)

## 2022-05-20 LAB — C DIFFICILE QUICK SCREEN W PCR REFLEX
C Diff antigen: NEGATIVE
C Diff interpretation: NOT DETECTED
C Diff toxin: NEGATIVE

## 2022-05-20 LAB — CREATININE, SERUM
Creatinine, Ser: 0.94 mg/dL (ref 0.61–1.24)
GFR, Estimated: >60 mL/min (ref >60)

## 2022-05-20 LAB — GLUCOSE, CAPILLARY
Glucose-Capillary: 120 mg/dL — ABNORMAL HIGH (ref 70–99)
Glucose-Capillary: 152 mg/dL — ABNORMAL HIGH (ref 70–99)

## 2022-05-20 LAB — MAGNESIUM: Magnesium: 2.8 mg/dL — ABNORMAL HIGH (ref 1.7–2.4)

## 2022-05-20 LAB — CBG MONITORING, ED
Glucose-Capillary: 186 mg/dL — ABNORMAL HIGH (ref 70–99)
Glucose-Capillary: 94 mg/dL (ref 70–99)

## 2022-05-20 LAB — MRSA NEXT GEN BY PCR, NASAL: MRSA by PCR Next Gen: NOT DETECTED

## 2022-05-20 MED ORDER — PANTOPRAZOLE SODIUM 40 MG PO TBEC
40.0000 mg | DELAYED_RELEASE_TABLET | Freq: Every day | ORAL | Status: DC
  Administered 2022-05-20 – 2022-05-24 (×5): 40 mg via ORAL
  Filled 2022-05-20 (×5): qty 1

## 2022-05-20 MED ORDER — POTASSIUM CHLORIDE 20 MEQ PO PACK
40.0000 meq | PACK | Freq: Once | ORAL | Status: AC
  Administered 2022-05-20: 40 meq via ORAL
  Filled 2022-05-20: qty 2

## 2022-05-20 MED ORDER — TRAZODONE HCL 50 MG PO TABS
50.0000 mg | ORAL_TABLET | Freq: Every day | ORAL | Status: DC
  Administered 2022-05-20 – 2022-05-23 (×4): 50 mg via ORAL
  Filled 2022-05-20 (×4): qty 1

## 2022-05-20 MED ORDER — KETOROLAC TROMETHAMINE 30 MG/ML IJ SOLN
15.0000 mg | Freq: Once | INTRAMUSCULAR | Status: AC
Start: 2022-05-20 — End: 2022-05-20
  Administered 2022-05-20: 15 mg via INTRAVENOUS
  Filled 2022-05-20: qty 1

## 2022-05-20 MED ORDER — FUROSEMIDE 40 MG PO TABS
40.0000 mg | ORAL_TABLET | Freq: Once | ORAL | Status: AC
  Administered 2022-05-20: 40 mg via ORAL
  Filled 2022-05-20: qty 1

## 2022-05-20 MED ORDER — FUROSEMIDE 40 MG PO TABS
40.0000 mg | ORAL_TABLET | Freq: Every day | ORAL | Status: DC
Start: 2022-05-20 — End: 2022-05-20

## 2022-05-20 MED ORDER — POTASSIUM CHLORIDE 10 MEQ/100ML IV SOLN
10.0000 meq | Freq: Once | INTRAVENOUS | Status: DC
  Administered 2022-05-20: 10 meq via INTRAVENOUS
  Filled 2022-05-20: qty 100

## 2022-05-20 MED ORDER — FUROSEMIDE 40 MG PO TABS
40.0000 mg | ORAL_TABLET | Freq: Two times a day (BID) | ORAL | Status: DC
  Administered 2022-05-20 – 2022-05-22 (×4): 40 mg via ORAL
  Filled 2022-05-20 (×4): qty 1

## 2022-05-20 MED ORDER — SODIUM CHLORIDE 0.9 % IV SOLN
3.0000 g | Freq: Four times a day (QID) | INTRAVENOUS | Status: DC
  Administered 2022-05-20 – 2022-05-23 (×12): 3 g via INTRAVENOUS
  Filled 2022-05-20 (×3): qty 8
  Filled 2022-05-20: qty 3
  Filled 2022-05-20 (×2): qty 8
  Filled 2022-05-20: qty 3
  Filled 2022-05-20 (×4): qty 8
  Filled 2022-05-20: qty 3
  Filled 2022-05-20 (×2): qty 8

## 2022-05-20 MED ORDER — LIDOCAINE 5 % EX PTCH
1.0000 | MEDICATED_PATCH | CUTANEOUS | Status: DC
  Filled 2022-05-20 (×5): qty 1

## 2022-05-20 MED ORDER — TRAMADOL HCL 50 MG PO TABS
50.0000 mg | ORAL_TABLET | Freq: Four times a day (QID) | ORAL | Status: DC | PRN
  Administered 2022-05-21 – 2022-05-24 (×11): 50 mg via ORAL
  Filled 2022-05-20 (×12): qty 1

## 2022-05-20 MED ORDER — DIGOXIN 0.25 MG/ML IJ SOLN
0.5000 mg | Freq: Once | INTRAMUSCULAR | Status: AC
  Administered 2022-05-20: 0.5 mg via INTRAVENOUS
  Filled 2022-05-20: qty 2

## 2022-05-20 MED ORDER — POTASSIUM CHLORIDE 10 MEQ/100ML IV SOLN: 10.0000 meq | INTRAVENOUS | Status: DC

## 2022-05-20 MED ORDER — IPRATROPIUM-ALBUTEROL 0.5-2.5 (3) MG/3ML IN SOLN: 3.0000 mL | Freq: Once | RESPIRATORY_TRACT | Status: DC

## 2022-05-20 MED ORDER — LOPERAMIDE HCL 2 MG PO CAPS
2.0000 mg | ORAL_CAPSULE | ORAL | Status: DC | PRN
  Administered 2022-05-20 – 2022-05-24 (×2): 2 mg via ORAL
  Filled 2022-05-20 (×2): qty 1

## 2022-05-20 MED ORDER — DILTIAZEM HCL 25 MG/5ML IV SOLN
10.0000 mg | Freq: Once | INTRAVENOUS | Status: AC
  Administered 2022-05-20: 10 mg via INTRAVENOUS
  Filled 2022-05-20: qty 5

## 2022-05-20 MED ORDER — INSULIN ASPART 100 UNIT/ML IJ SOLN
0.0000 [IU] | Freq: Three times a day (TID) | INTRAMUSCULAR | Status: DC
  Administered 2022-05-20 (×2): 4 [IU] via SUBCUTANEOUS
  Administered 2022-05-21 (×2): 3 [IU] via SUBCUTANEOUS
  Administered 2022-05-21: 4 [IU] via SUBCUTANEOUS
  Administered 2022-05-21: 3 [IU] via SUBCUTANEOUS
  Administered 2022-05-22: 5 [IU] via SUBCUTANEOUS
  Administered 2022-05-22: 4 [IU] via SUBCUTANEOUS
  Administered 2022-05-22: 7 [IU] via SUBCUTANEOUS
  Administered 2022-05-23: 3 [IU] via SUBCUTANEOUS
  Administered 2022-05-23: 4 [IU] via SUBCUTANEOUS
  Administered 2022-05-23 (×2): 3 [IU] via SUBCUTANEOUS
  Filled 2022-05-20 (×13): qty 1

## 2022-05-20 NOTE — ED Notes (Signed)
Pt Afib RVR on tele, HR 130's-150's. Dr. Roosevelt Locks notified, will come assess pt.

## 2022-05-20 NOTE — ED Notes (Signed)
Pt to be admitted to [place], Report given to [facility], vss at this time. Pt being transported by ED tech. Belongings sent with pt.

## 2022-05-20 NOTE — Progress Notes (Addendum)
Cross Cover IV bolus dose cardizem more effective for rate control overnight Oral metformin discontinued with use of oral contrast and blood glucose over 300 on admit with ongoing diarrhea, sepsis,pna, stress state, willl need SSI for glycemic control and has ben ordered Lovenox discontinued as patient is on pradaxa therapy Endorsed chest discomfort overnight with elevated heart rates. Managed effectively with IV toradol

## 2022-05-20 NOTE — Progress Notes (Signed)
PT refused Neb Tx, states that it makes him too jittery and that he fells fine and will notify his nurse if he feels that he needs respiratory intervention. Diminished breaths sounds, with SpO2 of 95% on 4L and in NARD or SOB upon assessment.

## 2022-05-20 NOTE — Consult Note (Signed)
[facility] Cardiology  CARDIOLOGY CONSULT NOTE  Patient ID: CORDERIUS SARACENI MRN: [id number] DOB/AGE: 1951/10/02 71 y.o.  Admit date: 05/19/2022 Referring Physician Roosevelt Locks Primary Physician [facility] Primary Cardiologist Dawid Dupriest Reason for Consultation atrial fibrillation with rapid ventricular rate  HPI: 71 year old gentleman referred for evaluation of atrial fibrillation with rapid ventricular rate.  The patient was recently hospitalized 05/16/2022 for COPD exacerbation and paroxysmal left atrial fibrillation with rapid ventricular rate.  The patient was discharged home 05/18/2022 on home Pradaxa, Cardizem CD 240 mg daily, and metoprolol tartrate 50 mg twice daily.  He reports reexperiencing progressive shortness of breath on 05/19/2022 and presents today with recurrent respiratory failure, chest x-ray revealing bilateral lower lobe opacities consistent with aspiration pneumonia, leukocytosis with white count of 26,000, and atrial fibrillation with rapid ventricular rate.  Heart rate improved after 0.5 mg IV dig, 5 mg IV Lopressor, and 10 mg IV diltiazem.  Patient was restarted on home Pradaxa for stroke prevention, Cardizem CD 240 mg daily and metoprolol tartrate 50 mg twice daily for rate and rhythm control.  Heart rate currently 80 bpm.  Patient reports feeling better with less shortness of breath.  He denies chest pain.  Review of systems complete and found to be negative unless listed above     Past Medical History:  Diagnosis Date   A-fib ([place])    CAD (coronary artery disease)    Colitis    COPD (chronic obstructive pulmonary disease) (HCC)    Emphysema lung (HCC)    GERD (gastroesophageal reflux disease)    Hypertension    MI (myocardial infarction) ([place]) 1999    Past Surgical History:  Procedure Laterality Date   ABDOMINAL HERNIA REPAIR Bilateral    CHOLECYSTECTOMY     COLONOSCOPY WITH PROPOFOL N/A 02/01/2018   Procedure: COLONOSCOPY WITH PROPOFOL;  Surgeon: Jonathon Bellows,  MD;  Location: [facility] ENDOSCOPY;  Service: Gastroenterology;  Laterality: N/A;   KNEE ARTHROSCOPY Bilateral     (Not in a hospital admission)  Social History   Socioeconomic History   Marital status: Married    Spouse name: Not on file   Number of children: Not on file   Years of education: Not on file   Highest education level: Not on file  Occupational History   Not on file  Tobacco Use   Smoking status: Former    Packs/day: 1.00    Years: 30.00    Pack years: 30.00    Types: Cigarettes    Quit date: 12/18/2001    Years since quitting: 20.4   Smokeless tobacco: Never  Vaping Use   Vaping Use: Never used  Substance and Sexual Activity   Alcohol use: No   Drug use: No   Sexual activity: Never  Other Topics Concern   Not on file  Social History Narrative   Not on file   Social Determinants of Health   Financial Resource Strain: Not on file  Food Insecurity: Not on file  Transportation Needs: Not on file  Physical Activity: Not on file  Stress: Not on file  Social Connections: Not on file  Intimate Partner Violence: Not on file    Family History  Problem Relation Age of Onset   CAD Father    Stroke Father    CAD Sister    Stroke Brother       Review of systems complete and found to be negative unless listed above      PHYSICAL EXAM  General: Well developed, well nourished, in  no acute distress HEENT:  Normocephalic and atramatic Neck:  No JVD.  Lungs: Clear bilaterally to auscultation and percussion. Heart: HRRR . Normal S1 and S2 without gallops or murmurs.  Abdomen: Bowel sounds are positive, abdomen soft and non-tender  Msk:  Back normal, normal gait. Normal strength and tone for age. Extremities: No clubbing, cyanosis or edema.   Neuro: Alert and oriented X 3. Psych:  Good affect, responds appropriately  Labs:   Lab Results  Component Value Date   WBC 26.0 (H) 05/19/2022   HGB 12.9 (L) 05/19/2022   HCT 41.4 05/19/2022   MCV 92.0 05/19/2022    PLT 305 05/19/2022    Recent Labs  Lab 05/19/22 1730 05/20/22 0450  NA 136 141  K 3.4* 4.3  CL 98 102  CO2 30 31  BUN 19 14  CREATININE 1.02 0.97  0.94  CALCIUM 8.4* 8.8*  PROT 6.9  --   BILITOT 0.5  --   ALKPHOS 63  --   ALT 17  --   AST 18  --   GLUCOSE 389* 259*   Lab Results  Component Value Date   CKTOTAL 74 02/05/2013   CKMB 0.9 02/05/2013   TROPONINI <0.03 01/05/2019   TROPONINI <0.03 01/05/2019    Lab Results  Component Value Date   CHOL 106 12/06/2021   CHOL 116 09/04/2016   CHOL 185 02/04/2013   Lab Results  Component Value Date   HDL 33 (L) 12/06/2021   HDL 42 09/04/2016   HDL 38 (L) 02/04/2013   Lab Results  Component Value Date   LDLCALC 51 12/06/2021   LDLCALC 51 09/04/2016   LDLCALC 114 (H) 02/04/2013   Lab Results  Component Value Date   TRIG 108 12/06/2021   TRIG 116 09/04/2016   TRIG 166 02/04/2013   Lab Results  Component Value Date   CHOLHDL 3.2 12/06/2021   CHOLHDL 2.8 09/04/2016   No results found for: LDLDIRECT    Radiology: CT CHEST ABDOMEN PELVIS W CONTRAST  Result Date: 05/19/2022 CLINICAL DATA:  Sepsis EXAM: CT CHEST, ABDOMEN, AND PELVIS WITH CONTRAST TECHNIQUE: Multidetector CT imaging of the chest, abdomen and pelvis was performed following the standard protocol during bolus administration of intravenous contrast. RADIATION DOSE REDUCTION: This exam was performed according to the departmental dose-optimization program which includes automated exposure control, adjustment of the mA and/or kV according to patient size and/or use of iterative reconstruction technique. CONTRAST:  137m OMNIPAQUE IOHEXOL 300 MG/ML  SOLN COMPARISON:  Chest x-ray 05/19/2022, CT chest 02/04/2013, CT abdomen pelvis 12/11/2017 FINDINGS: CT CHEST FINDINGS Cardiovascular: Nonaneurysmal aorta. Moderate aortic atherosclerosis. Normal cardiac size. Trace pericardial effusion Mediastinum/Nodes: Midline trachea. No thyroid mass. No suspicious lymph nodes.  Esophagus within normal limits Lungs/Pleura: Emphysema. No pleural effusion or pneumothorax. Irregular densities and patchy consolidations greatest within the bilateral lung bases, right middle lobe, and lingula. Lesser involvement of right upper lobe. Musculoskeletal: Sternum is intact.  No acute osseous abnormality CT ABDOMEN PELVIS FINDINGS Hepatobiliary: Status post cholecystectomy. No focal hepatic abnormality or biliary dilatation. Pancreas: Unremarkable. No pancreatic ductal dilatation or surrounding inflammatory changes. Spleen: Normal in size without focal abnormality. Adrenals/Urinary Tract: Adrenal glands are within normal limits. Kidneys show no hydronephrosis. Subcentimeter hypodensity mid right kidney too small to further characterize. Exophytic cyst lower pole left kidney, no follow-up imaging recommended. Bladder is unremarkable. Stomach/Bowel: Stomach nonenlarged. No dilated small bowel. Fluid within the colon. No acute bowel wall thickening. Negative appendix Vascular/Lymphatic: Moderate aortic atherosclerosis. No aneurysm. Borderline  gastrohepatic lymph node measuring 10 mm. Reproductive: Prostate is unremarkable. Other: Negative for pelvic effusion or free air. Prior right groin hernia repair. There are small fat containing inguinal hernias. Musculoskeletal: No acute osseous abnormality IMPRESSION: 1. Irregular airspace disease and patchy consolidations greatest within the bilateral lung bases, right middle lobe and lingula, suspicious for pneumonia. 2. Emphysema 3. Fluid throughout the colon suggesting diarrheal illness. No bowel wall thickening. Otherwise no CT evidence for acute intra-abdominal or pelvic abnormality. Electronically Signed   By: Donavan Foil M.D.   On: 05/19/2022 20:46   DG Chest Portable 1 View  Result Date: 05/19/2022 CLINICAL DATA:  Shortness of breath. Patient reports COPD exacerbation with progressive shortness of breath EXAM: PORTABLE CHEST 1 VIEW COMPARISON:   Radiograph 05/16/2022 FINDINGS: Chronic hyperinflation with advanced emphysema. Progressive patchy opacity in both lung bases. Stable heart size and mediastinal contours. No pneumothorax or large pleural effusion. Stable osseous structures. IMPRESSION: 1. Progressive patchy opacity in the lung bases, suspicious for pneumonia. Given distribution, aspiration is considered. Recommend aspiration risk factor assessment. 2. Chronic hyperinflation and advanced emphysema. Electronically Signed   By: Keith Rake M.D.   On: 05/19/2022 17:54   DG Chest Port 1 View  Result Date: 05/16/2022 CLINICAL DATA:  Chest pain. EXAM: PORTABLE CHEST 1 VIEW COMPARISON:  February 22, 2022. FINDINGS: The heart size and mediastinal contours are within normal limits. Mild bibasilar atelectasis or infiltrates are noted. Emphysematous disease is noted in the upper lobes. The visualized skeletal structures are unremarkable. IMPRESSION: Mild bibasilar subsegmental atelectasis or infiltrates are noted. Emphysema (ICD10-J43.9). Electronically Signed   By: Marijo Conception M.D.   On: 05/16/2022 10:09   ECHOCARDIOGRAM COMPLETE  Result Date: 05/17/2022    ECHOCARDIOGRAM REPORT   Patient Name:   RAMY GRETH Date of Exam: 05/16/2022 Medical Rec #:  [id number]       Height:       66.0 in Accession #:    [id number]      Weight:       198.0 lb Date of Birth:  21-Dec-1950       BSA:          1.991 m Patient Age:    8 years        BP:           113/95 mmHg Patient Gender: M               HR:           121 bpm. Exam Location:  [facility] Procedure: 2D Echo, Color Doppler and Cardiac Doppler Indications:     I48.91 Atrial Fibrillation  History:         Patient has prior history of Echocardiogram examinations.                  Previous Myocardial Infarction and CAD, COPD and Emphysema,                  Arrythmias:Atrial Fibrillation; Risk Factors:Hypertension,                  Diabetes and Dyslipidemia.  Sonographer:     Rosalia Hammers Referring Phys:  [id number]  [id number] AGBATA Diagnosing Phys: Isaias Cowman MD  Sonographer Comments: Technically difficult study due to poor echo windows. Image acquisition challenging due to patient body habitus, Image acquisition challenging due to COPD and Image acquisition challenging due to respiratory motion. IMPRESSIONS  1. Left ventricular ejection fraction, by estimation, is 55 to 60%.  The left ventricle has normal function. The left ventricle has no regional wall motion abnormalities. There is mild left ventricular hypertrophy. Left ventricular diastolic parameters are indeterminate.  2. Right ventricular systolic function is normal. The right ventricular size is normal.  3. The mitral valve is normal in structure. Mild mitral valve regurgitation. No evidence of mitral stenosis.  4. Tricuspid valve regurgitation is mild to moderate.  5. The aortic valve is normal in structure. Aortic valve regurgitation is not visualized. No aortic stenosis is present.  6. The inferior vena cava is normal in size with greater than 50% respiratory variability, suggesting right atrial pressure of 3 mmHg. FINDINGS  Left Ventricle: Left ventricular ejection fraction, by estimation, is 55 to 60%. The left ventricle has normal function. The left ventricle has no regional wall motion abnormalities. The left ventricular internal cavity size was normal in size. There is  mild left ventricular hypertrophy. Left ventricular diastolic parameters are indeterminate. Right Ventricle: The right ventricular size is normal. No increase in right ventricular wall thickness. Right ventricular systolic function is normal. Left Atrium: Left atrial size was normal in size. Right Atrium: Right atrial size was normal in size. Pericardium: There is no evidence of pericardial effusion. Mitral Valve: The mitral valve is normal in structure. Mild mitral valve regurgitation. No evidence of mitral valve stenosis. MV peak gradient, 3.6 mmHg. The mean mitral valve gradient is  1.0 mmHg. Tricuspid Valve: The tricuspid valve is normal in structure. Tricuspid valve regurgitation is mild to moderate. No evidence of tricuspid stenosis. Aortic Valve: The aortic valve is normal in structure. Aortic valve regurgitation is not visualized. No aortic stenosis is present. Aortic valve mean gradient measures 3.0 mmHg. Aortic valve peak gradient measures 6.6 mmHg. Aortic valve area, by VTI measures 2.20 cm. Pulmonic Valve: The pulmonic valve was normal in structure. Pulmonic valve regurgitation is not visualized. No evidence of pulmonic stenosis. Aorta: The aortic root is normal in size and structure. Venous: The inferior vena cava is normal in size with greater than 50% respiratory variability, suggesting right atrial pressure of 3 mmHg. IAS/Shunts: No atrial level shunt detected by color flow Doppler.  LEFT VENTRICLE PLAX 2D LVIDd:         4.05 cm LVIDs:         2.82 cm LV PW:         1.32 cm LV IVS:        1.40 cm LVOT diam:     1.80 cm LV SV:         47 LV SV Index:   24 LVOT Area:     2.54 cm  RIGHT VENTRICLE RV Basal diam:  3.52 cm LEFT ATRIUM             Index        RIGHT ATRIUM           Index LA diam:        3.10 cm 1.56 cm/m   RA Area:     15.10 cm LA Vol (A2C):   53.5 ml 26.87 ml/m  RA Volume:   39.70 ml  19.94 ml/m LA Vol (A4C):   36.0 ml 18.08 ml/m LA Biplane Vol: 44.3 ml 22.25 ml/m  AORTIC VALVE                    PULMONIC VALVE AV Area (Vmax):    2.60 cm     PV Vmax:       1.55 m/s AV  Area (Vmean):   2.46 cm     PV Vmean:      95.600 cm/s AV Area (VTI):     2.20 cm     PV VTI:        0.204 m AV Vmax:           128.00 cm/s  PV Peak grad:  9.6 mmHg AV Vmean:          82.700 cm/s  PV Mean grad:  5.0 mmHg AV VTI:            0.213 m AV Peak Grad:      6.6 mmHg AV Mean Grad:      3.0 mmHg LVOT Vmax:         131.00 cm/s LVOT Vmean:        80.000 cm/s LVOT VTI:          0.184 m LVOT/AV VTI ratio: 0.86  AORTA Ao Root diam: 4.00 cm MITRAL VALVE MV Area (PHT): 3.89 cm    SHUNTS MV Area  VTI:   2.53 cm    Systemic VTI:  0.18 m MV Peak grad:  3.6 mmHg    Systemic Diam: 1.80 cm MV Mean grad:  1.0 mmHg MV Vmax:       0.95 m/s MV Vmean:      48.7 cm/s MV Decel Time: 195 msec MV E velocity: 84.20 cm/s Isaias Cowman MD Electronically signed by Isaias Cowman MD Signature Date/Time: 05/17/2022/1:23:20 PM    Final     EKG:   ASSESSMENT AND PLAN:   1.  Paroxysmal atrial fibrillation with rapid ventricular rate, exacerbated by respiratory failure and aspiration pneumonia, heart rate improved after intravenous digoxin, metoprolol, and diltiazem.  Heart rate currently well controlled on home Cardizem CD 240 mg daily and metoprolol tartrate 50 mg twice daily.  Patient on Pradaxa for stroke prevention. 2.  Recurrent respiratory failure, chest x-ray consistent with aspiration pneumonia with [place] ptosis with white count 26,000.  Patient on broad-spectrum antibiotics. 3.  Coronary artery disease, occluded OM 2 by cardiac catheterization 2008, patient denies chest pain 4.  Chronic diastolic congestive heart failure, HFpEF, LVEF 55-60% 05/16/2022  Recommendations  1.  Agree with current plan 2.  Continue Pradaxa for stroke prevention 3.  Continue Cardizem CD and metoprolol tartrate for rate and rhythm control 4.  No further cardiac diagnostics at this time  Signed: Isaias Cowman MD,PhD, [facility] 05/20/2022, 2:33 PM

## 2022-05-20 NOTE — ED Notes (Signed)
Dr. Lorinda Creed at bedside

## 2022-05-20 NOTE — Progress Notes (Signed)
  Progress Note   Patient: Darius Norris LTJ:[id number] DOB: [birth year] DOA: 05/19/2022     0 DOS: the patient was seen and examined on 05/20/2022   Brief hospital course: Darius Norris is a 71 year old male with history of COPD with chronic respiratory failure on 3 L nasal cannula continuously, atrial fibrillation, CAD, hypertension, GERD, who presented to the emergency department via EMS for chief concerns of shortness of breath.  Patient woke up in the morning, did not feel well, had a worsening short of breath and palpitation. Initial vitals in the emergency department showed temperature of 98.7, respiration rate of 25, heart rate of 110, blood pressure 157/87, SPO2 of 96% on 5 L nasal cannula. Chest x-ray and a CT scan showed bilateral lower lobe patchy infiltrates.  Consistent with aspiration pneumonia.  Patient antibiotic was changed to Unasyn. Patient also had atrial fibrillation with RVR.    Assessment and Plan: Acute on chronic hypoxemic respiratory failure. Severe sepsis secondary to aspiration pneumonia. Bilateral lower lobe aspiration pneumonia. Based on patient presentation, condition was consistent with aspiration during the sleep, which has precipitated worsening shortness of breath and worsening hypoxemia.   Patient also had significant tachycardia, leukocytosis, given worsening hypoxemia, patient met severe sepsis criteria secondary to aspiration pneumonia. Patient antibiotic will change to Unasyn to cover aspiration pathogen. Patient does not have any dysphagia, aspiration might happened during sleep due to acid reflux.  Add PPI.  Paroxysmal atrial fibrillation with RVR. Mild elevation troponin. Chronic diastolic congestive heart failure Tachycardia probably triggered by aspiration pneumonia.  Patient received metoprolol as needed, will give 05 mg of digoxin IV.  Continue oral metoprolol and oral diltiazem. Continue Pradaxa for stroke prevention. Mild elevation troponin  secondary to fast heart rate and sepsis. Patient does not seem to have significant volume overload at this time  Mild hypokalemia. Patient be given 80 mEq oral potassium.  Rheumatoid arthritis.   Follow-up with PCP as outpatient.  Type 2 diabetes. Continue sliding scale insulin.  Diarrhea. C. difficile toxin negative.  Symptomatically treatment      Subjective:  Patient still complaining of short of breath with exertion.  Palpitation. Loose stools.  Physical Exam: Vitals:   05/20/22 0600 05/20/22 0700 05/20/22 0800 05/20/22 0900  BP:    (!) 159/105  Pulse: 94 (!) 133 (!) 135 80  Resp: $Remo'14 19 16 20  'YyHiL$ Temp:      TempSrc:      SpO2: 95% 98% 97% 99%  Weight:      Height:       General exam: Appears calm and comfortable  Respiratory system: Decreased breathing sounds without crackles. Respiratory effort normal. Cardiovascular system: Irregularly irregular. No JVD, murmurs, rubs, gallops or clicks. No pedal edema. Gastrointestinal system: Abdomen is nondistended, soft and nontender. No organomegaly or masses felt. Normal bowel sounds heard. Central nervous system: Alert and oriented. No focal neurological deficits. Extremities: Symmetric 5 x 5 power. Skin: No rashes, lesions or ulcers Psychiatry: Judgement and insight appear normal. Mood & affect appropriate.   Data Reviewed:  Reviewed chest x-ray, chest CT scan all lab results.  Family Communication: wife updated  Disposition: Status is: Inpatient Remains inpatient appropriate because: severity of disease, iv treatment  Planned Discharge Destination: Home with Home Health    Time spent: 36 minutes  Author: Sharen Hones, MD 05/20/2022 10:10 AM  For on call review [email or web address].

## 2022-05-21 ENCOUNTER — Other Ambulatory Visit: Payer: Self-pay

## 2022-05-21 DIAGNOSIS — J9621 Acute and chronic respiratory failure with hypoxia: Secondary | ICD-10-CM | POA: Diagnosis not present

## 2022-05-21 DIAGNOSIS — I4891 Unspecified atrial fibrillation: Secondary | ICD-10-CM | POA: Diagnosis not present

## 2022-05-21 DIAGNOSIS — I5032 Chronic diastolic (congestive) heart failure: Secondary | ICD-10-CM | POA: Diagnosis not present

## 2022-05-21 DIAGNOSIS — J690 Pneumonitis due to inhalation of food and vomit: Secondary | ICD-10-CM | POA: Diagnosis not present

## 2022-05-21 LAB — GLUCOSE, CAPILLARY
Glucose-Capillary: 147 mg/dL — ABNORMAL HIGH (ref 70–99)
Glucose-Capillary: 138 mg/dL — ABNORMAL HIGH (ref 70–99)
Glucose-Capillary: 124 mg/dL — ABNORMAL HIGH (ref 70–99)
Glucose-Capillary: 167 mg/dL — ABNORMAL HIGH (ref 70–99)

## 2022-05-21 LAB — BASIC METABOLIC PANEL
Anion gap: 9 (ref 5–15)
BUN: 20 mg/dL (ref 8–23)
CO2: 32 mmol/L (ref 22–32)
Calcium: 8.6 mg/dL — ABNORMAL LOW (ref 8.9–10.3)
Chloride: 100 mmol/L (ref 98–111)
Creatinine, Ser: 1.1 mg/dL (ref 0.61–1.24)
GFR, Estimated: >60 mL/min (ref >60)
Glucose, Bld: 161 mg/dL — ABNORMAL HIGH (ref 70–99)
Potassium: 3.2 mmol/L — ABNORMAL LOW (ref 3.5–5.1)
Sodium: 141 mmol/L (ref 135–145)

## 2022-05-21 LAB — CBC
HCT: 42.5 % (ref 39.0–52.0)
Hemoglobin: 13.5 g/dL (ref 13.0–17.0)
MCH: 28.7 pg (ref 26.0–34.0)
MCHC: 31.8 g/dL (ref 30.0–36.0)
MCV: 90.2 fL (ref 80.0–100.0)
Platelets: 320 10*3/uL (ref 150–400)
RBC: 4.71 MIL/uL (ref 4.22–5.81)
RDW: 17.2 % — ABNORMAL HIGH (ref 11.5–15.5)
WBC: 17.6 10*3/uL — ABNORMAL HIGH (ref 4.0–10.5)
nRBC: 0 % (ref 0.0–0.2)

## 2022-05-21 LAB — TROPONIN I (HIGH SENSITIVITY)
Troponin I (High Sensitivity): 73 ng/L — ABNORMAL HIGH (ref <18)
Troponin I (High Sensitivity): 75 ng/L — ABNORMAL HIGH (ref <18)

## 2022-05-21 LAB — MAGNESIUM: Magnesium: 2.4 mg/dL (ref 1.7–2.4)

## 2022-05-21 MED ORDER — NITROGLYCERIN 0.4 MG SL SUBL
0.4000 mg | SUBLINGUAL_TABLET | SUBLINGUAL | Status: DC | PRN
  Administered 2022-05-21 (×3): 0.4 mg via SUBLINGUAL
  Filled 2022-05-21: qty 1

## 2022-05-21 MED ORDER — POTASSIUM CHLORIDE 20 MEQ PO PACK
40.0000 meq | PACK | Freq: Three times a day (TID) | ORAL | Status: AC
  Administered 2022-05-21 (×3): 40 meq via ORAL
  Filled 2022-05-21 (×3): qty 2

## 2022-05-21 MED ORDER — NITROGLYCERIN 0.4 MG SL SUBL: 0.4000 mg | SUBLINGUAL_TABLET | SUBLINGUAL | Status: DC | PRN

## 2022-05-21 MED ORDER — SODIUM CHLORIDE 0.9 % IV SOLN: INTRAVENOUS | Status: DC

## 2022-05-21 NOTE — Progress Notes (Signed)
  Progress Note   Patient: Darius Norris:[id number] DOB: 1951-09-26 DOA: 05/19/2022     1 DOS: the patient was seen and examined on 05/21/2022   Brief hospital course: [name] is a 71 year old male with history of COPD with chronic respiratory failure on 3 L nasal cannula continuously, atrial fibrillation, CAD, hypertension, GERD, who presented to the emergency department via EMS for chief concerns of shortness of breath.  Patient woke up in the morning, did not feel well, had a worsening short of breath and palpitation. Initial vitals in the emergency department showed temperature of 98.7, respiration rate of 25, heart rate of 110, blood pressure 157/87, SPO2 of 96% on 5 L nasal cannula. Chest x-ray and a CT scan showed bilateral lower lobe patchy infiltrates.  Consistent with aspiration pneumonia.  Patient antibiotic was changed to Unasyn. Patient also had atrial fibrillation with RVR.    Assessment and Plan: Acute on chronic hypoxemic respiratory failure. Severe sepsis secondary to aspiration pneumonia. Bilateral lower lobe aspiration pneumonia. Aspiration pneumonia appears to be secondary to gastroesophageal reflux disease while at the sleep.  Continue PPI. Continue Unasyn.  Currently on 4 L oxygen, will continue to wean.   Paroxysmal atrial fibrillation with RVR. Mild elevation troponin. Chronic diastolic congestive heart failure Patient presented with atrial fibrillation with RVR, then converted to sinus, now patient is back to atrial fibrillation with RVR.  Has been seen by cardiology.  Recurrent atrial fibrillation appears to be due to other medical conditions per cardiology. Continue beta-blocker and calcium channel blocker. Patient also is treated with oral Lasix.   Mild hypokalemia. Repleted again.   Rheumatoid arthritis.   Follow-up with PCP as outpatient.  Type 2 diabetes. Continue sliding scale insulin.   Diarrhea. Improved.      Subjective:   Patient still has short of breath with exertion.  No cough.   Physical Exam: Vitals:   05/21/22 0945 05/21/22 1035 05/21/22 1100 05/21/22 1153  BP:    128/76  Pulse:    78  Resp: 16 (!) '23 15 19  '$ Temp:    98 F (36.7 C)  TempSrc:    Oral  SpO2:    98%  Weight:      Height:       General exam: Appears calm and comfortable  Respiratory system: Clear to auscultation. Respiratory effort normal. Cardiovascular system: S1 & S2 heard, RRR. No JVD, murmurs, rubs, gallops or clicks. No pedal edema. Gastrointestinal system: Abdomen is nondistended, soft and nontender. No organomegaly or masses felt. Normal bowel sounds heard. Central nervous system: Alert and oriented. No focal neurological deficits. Extremities: Symmetric 5 x 5 power. Skin: No rashes, lesions or ulcers Psychiatry: Judgement and insight appear normal. Mood & affect appropriate.   Data Reviewed:  Lab results reviewed.  Family Communication:   Disposition: Status is: Inpatient Remains inpatient appropriate because: Severity of disease, multiple new medical treatment.  Condition is not stable.  Planned Discharge Destination: Home with Home Health    Time spent: 55 minutes  Author: Sharen Hones, MD 05/21/2022 12:07 PM  For on call review [email or web address].

## 2022-05-21 NOTE — Progress Notes (Signed)
Nurse made aware by nurse secretary that patient was calling complaining of left chest pain. EKG and vital signs obtained. Nitro given sublingual. EKD reads STEMI. On call Dalphine Handing, NP was made aware via secure chat.

## 2022-05-21 NOTE — Progress Notes (Signed)
[facility] Cardiology  SUBJECTIVE: Patient sitting on side of bed, currently denies chest pain   Vitals:   05/21/22 0736 05/21/22 0800 05/21/22 0913 05/21/22 0945  BP: (!) 148/79     Pulse: 92  (!) 58   Resp: (!) '22 20  16  '$ Temp:      TempSrc:      SpO2: 96%  97%   Weight:      Height:         Intake/Output Summary (Last 24 hours) at 05/21/2022 1034 Last data filed at 05/21/2022 0900 Gross per 24 hour  Intake 1002.55 ml  Output 1100 ml  Net -97.45 ml      PHYSICAL EXAM  General: Well developed, well nourished, in no acute distress HEENT:  Normocephalic and atramatic Neck:  No JVD.  Lungs: Clear bilaterally to auscultation and percussion. Heart: HRRR . Normal S1 and S2 without gallops or murmurs.  Abdomen: Bowel sounds are positive, abdomen soft and non-tender  Msk:  Back normal, normal gait. Normal strength and tone for age. Extremities: No clubbing, cyanosis or edema.   Neuro: Alert and oriented X 3. Psych:  Good affect, responds appropriately   LABS: Basic Metabolic Panel: Recent Labs    05/20/22 0450 05/21/22 0432  NA 141 141  K 4.3 3.2*  CL 102 100  CO2 31 32  GLUCOSE 259* 161*  BUN 14 20  CREATININE 0.97  0.94 1.10  CALCIUM 8.8* 8.6*  MG 2.8* 2.4   Liver Function Tests: Recent Labs    05/19/22 1730  AST 18  ALT 17  ALKPHOS 63  BILITOT 0.5  PROT 6.9  ALBUMIN 3.6   No results for input(s): LIPASE, AMYLASE in the last 72 hours. CBC: Recent Labs    05/19/22 1730 05/21/22 0432  WBC 26.0* 17.6*  HGB 12.9* 13.5  HCT 41.4 42.5  MCV 92.0 90.2  PLT 305 320   Cardiac Enzymes: No results for input(s): CKTOTAL, CKMB, CKMBINDEX, TROPONINI in the last 72 hours. BNP: Invalid input(s): POCBNP D-Dimer: No results for input(s): DDIMER in the last 72 hours. Hemoglobin A1C: No results for input(s): HGBA1C in the last 72 hours. Fasting Lipid Panel: No results for input(s): CHOL, HDL, LDLCALC, TRIG, CHOLHDL, LDLDIRECT in the last 72 hours. Thyroid  Function Tests: No results for input(s): TSH, T4TOTAL, T3FREE, THYROIDAB in the last 72 hours.  Invalid input(s): FREET3 Anemia Panel: No results for input(s): VITAMINB12, FOLATE, FERRITIN, TIBC, IRON, RETICCTPCT in the last 72 hours.  CT CHEST ABDOMEN PELVIS W CONTRAST  Result Date: 05/19/2022 CLINICAL DATA:  Sepsis EXAM: CT CHEST, ABDOMEN, AND PELVIS WITH CONTRAST TECHNIQUE: Multidetector CT imaging of the chest, abdomen and pelvis was performed following the standard protocol during bolus administration of intravenous contrast. RADIATION DOSE REDUCTION: This exam was performed according to the departmental dose-optimization program which includes automated exposure control, adjustment of the mA and/or kV according to patient size and/or use of iterative reconstruction technique. CONTRAST:  132m OMNIPAQUE IOHEXOL 300 MG/ML  SOLN COMPARISON:  Chest x-ray 05/19/2022, CT chest 02/04/2013, CT abdomen pelvis 12/11/2017 FINDINGS: CT CHEST FINDINGS Cardiovascular: Nonaneurysmal aorta. Moderate aortic atherosclerosis. Normal cardiac size. Trace pericardial effusion Mediastinum/Nodes: Midline trachea. No thyroid mass. No suspicious lymph nodes. Esophagus within normal limits Lungs/Pleura: Emphysema. No pleural effusion or pneumothorax. Irregular densities and patchy consolidations greatest within the bilateral lung bases, right middle lobe, and lingula. Lesser involvement of right upper lobe. Musculoskeletal: Sternum is intact.  No acute osseous abnormality CT ABDOMEN PELVIS FINDINGS Hepatobiliary: Status  post cholecystectomy. No focal hepatic abnormality or biliary dilatation. Pancreas: Unremarkable. No pancreatic ductal dilatation or surrounding inflammatory changes. Spleen: Normal in size without focal abnormality. Adrenals/Urinary Tract: Adrenal glands are within normal limits. Kidneys show no hydronephrosis. Subcentimeter hypodensity mid right kidney too small to further characterize. Exophytic cyst lower  pole left kidney, no follow-up imaging recommended. Bladder is unremarkable. Stomach/Bowel: Stomach nonenlarged. No dilated small bowel. Fluid within the colon. No acute bowel wall thickening. Negative appendix Vascular/Lymphatic: Moderate aortic atherosclerosis. No aneurysm. Borderline gastrohepatic lymph node measuring 10 mm. Reproductive: Prostate is unremarkable. Other: Negative for pelvic effusion or free air. Prior right groin hernia repair. There are small fat containing inguinal hernias. Musculoskeletal: No acute osseous abnormality IMPRESSION: 1. Irregular airspace disease and patchy consolidations greatest within the bilateral lung bases, right middle lobe and lingula, suspicious for pneumonia. 2. Emphysema 3. Fluid throughout the colon suggesting diarrheal illness. No bowel wall thickening. Otherwise no CT evidence for acute intra-abdominal or pelvic abnormality. Electronically Signed   By: Donavan Foil M.D.   On: 05/19/2022 20:46   DG Chest Portable 1 View  Result Date: 05/19/2022 CLINICAL DATA:  Shortness of breath. Patient reports COPD exacerbation with progressive shortness of breath EXAM: PORTABLE CHEST 1 VIEW COMPARISON:  Radiograph 05/16/2022 FINDINGS: Chronic hyperinflation with advanced emphysema. Progressive patchy opacity in both lung bases. Stable heart size and mediastinal contours. No pneumothorax or large pleural effusion. Stable osseous structures. IMPRESSION: 1. Progressive patchy opacity in the lung bases, suspicious for pneumonia. Given distribution, aspiration is considered. Recommend aspiration risk factor assessment. 2. Chronic hyperinflation and advanced emphysema. Electronically Signed   By: Keith Rake M.D.   On: 05/19/2022 17:54     Echo EF 55 to 60% 05/16/2022  TELEMETRY: Atrial fibrillation 123 bpm:  ASSESSMENT AND PLAN:  Principal Problem:   Aspiration pneumonia ([place]) Active Problems:   Elevated troponin   HTN (hypertension)   GERD (gastroesophageal  reflux disease)   Hypokalemia   Leukocytosis   Acute on chronic respiratory failure with hypoxemia (HCC)   Atrial fibrillation with RVR (HCC)   HLD (hyperlipidemia)   COPD (chronic obstructive pulmonary disease) (HCC)   Migraine   Paroxysmal atrial fibrillation with RVR (HCC)   Rheumatoid arthritis (HCC)   Chronic diastolic CHF (congestive heart failure) (HCC)   Severe sepsis (HCC)   Diarrhea    1. Paroxysmal atrial fibrillation with rapid ventricular rate, exacerbated by respiratory failure and aspiration pneumonia, heart rate improved after intravenous digoxin, metoprolol, and diltiazem.  Heart rate currently well controlled on home Cardizem CD 240 mg daily and metoprolol tartrate 50 mg twice daily.  Patient on Pradaxa for stroke prevention. 2.  Recurrent respiratory failure, chest x-ray consistent with aspiration pneumonia with [place] ptosis with white count 26,000.  Patient on broad-spectrum antibiotics. 3.  Coronary artery disease, occluded OM 2 by cardiac catheterization 2008, patient had mild episode of chest pain at 4 AM, currently chest pain-free, high-sensitivity troponin 71 and 73 4.  Chronic diastolic congestive heart failure, HFpEF, LVEF 55-60% 05/16/2022   Recommendations   1.  Agree with current plan 2.  Continue Pradaxa for stroke prevention 3.  Continue Cardizem CD and metoprolol tartrate for rate and rhythm control 4.  No further cardiac diagnostics at this time   Isaias Cowman, MD, PhD, [facility] 05/21/2022 10:34 AM

## 2022-05-21 NOTE — Progress Notes (Signed)
[employer] with chest pain that improved with nitro but spread to include left arm. EKG with PVC's. No STEMI criteria. Troponin pending. Continue to monitor on tele

## 2022-05-21 NOTE — Plan of Care (Signed)
  Problem: Fluid Volume: Goal: Hemodynamic stability will improve Outcome: Progressing   Problem: Clinical Measurements: Goal: Diagnostic test results will improve Outcome: Progressing Goal: Signs and symptoms of infection will decrease Outcome: Progressing   Problem: Respiratory: Goal: Ability to maintain adequate ventilation will improve Outcome: Progressing   Problem: Education: Goal: Ability to describe self-care measures that may prevent or decrease complications (Diabetes Survival Skills Education) will improve Outcome: Progressing   Problem: Coping: Goal: Ability to adjust to condition or change in health will improve Outcome: Progressing   Problem: Fluid Volume: Goal: Ability to maintain a balanced intake and output will improve Outcome: Progressing   Problem: Health Behavior/Discharge Planning: Goal: Ability to identify and utilize available resources and services will improve Outcome: Progressing Goal: Ability to manage health-related needs will improve Outcome: Progressing   Problem: Health Behavior/Discharge Planning: Goal: Ability to manage health-related needs will improve Outcome: Progressing

## 2022-05-22 DIAGNOSIS — J690 Pneumonitis due to inhalation of food and vomit: Secondary | ICD-10-CM | POA: Diagnosis not present

## 2022-05-22 DIAGNOSIS — J9621 Acute and chronic respiratory failure with hypoxia: Secondary | ICD-10-CM | POA: Diagnosis not present

## 2022-05-22 DIAGNOSIS — I4891 Unspecified atrial fibrillation: Secondary | ICD-10-CM | POA: Diagnosis not present

## 2022-05-22 LAB — GLUCOSE, CAPILLARY
Glucose-Capillary: 192 mg/dL — ABNORMAL HIGH (ref 70–99)
Glucose-Capillary: 133 mg/dL — ABNORMAL HIGH (ref 70–99)
Glucose-Capillary: 100 mg/dL — ABNORMAL HIGH (ref 70–99)
Glucose-Capillary: 183 mg/dL — ABNORMAL HIGH (ref 70–99)

## 2022-05-22 LAB — LEGIONELLA PNEUMOPHILA SEROGP 1 UR AG: L. pneumophila Serogp 1 Ur Ag: NEGATIVE

## 2022-05-22 LAB — MAGNESIUM: Magnesium: 2.2 mg/dL (ref 1.7–2.4)

## 2022-05-22 LAB — POTASSIUM: Potassium: 4.3 mmol/L (ref 3.5–5.1)

## 2022-05-22 LAB — BASIC METABOLIC PANEL
Anion gap: 9 (ref 5–15)
BUN: 23 mg/dL (ref 8–23)
CO2: 31 mmol/L (ref 22–32)
Calcium: 9.2 mg/dL (ref 8.9–10.3)
Chloride: 99 mmol/L (ref 98–111)
Creatinine, Ser: 1.15 mg/dL (ref 0.61–1.24)
GFR, Estimated: >60 mL/min (ref >60)
Glucose, Bld: 225 mg/dL — ABNORMAL HIGH (ref 70–99)
Potassium: 5.5 mmol/L — ABNORMAL HIGH (ref 3.5–5.1)
Sodium: 139 mmol/L (ref 135–145)

## 2022-05-22 LAB — TROPONIN I (HIGH SENSITIVITY)
Troponin I (High Sensitivity): 67 ng/L — ABNORMAL HIGH (ref <18)
Troponin I (High Sensitivity): 65 ng/L — ABNORMAL HIGH (ref <18)

## 2022-05-22 MED ORDER — DILTIAZEM HCL ER COATED BEADS 180 MG PO CP24
300.0000 mg | ORAL_CAPSULE | Freq: Every day | ORAL | Status: DC
  Administered 2022-05-22 – 2022-05-24 (×3): 300 mg via ORAL
  Filled 2022-05-22 (×3): qty 1

## 2022-05-22 MED ORDER — METOPROLOL TARTRATE 5 MG/5ML IV SOLN
INTRAVENOUS | Status: AC
  Administered 2022-05-22: 5 mg via INTRAVENOUS
  Filled 2022-05-22: qty 5

## 2022-05-22 MED ORDER — DIGOXIN 0.25 MG/ML IJ SOLN
0.2500 mg | Freq: Once | INTRAMUSCULAR | Status: AC
Start: 2022-05-22 — End: 2022-05-22
  Administered 2022-05-22: 0.25 mg via INTRAVENOUS
  Filled 2022-05-22: qty 2

## 2022-05-22 MED ORDER — DILTIAZEM HCL 25 MG/5ML IV SOLN
15.0000 mg | Freq: Once | INTRAVENOUS | Status: AC
  Administered 2022-05-22: 15 mg via INTRAVENOUS
  Filled 2022-05-22: qty 5

## 2022-05-22 MED ORDER — FUROSEMIDE 10 MG/ML IJ SOLN
40.0000 mg | Freq: Every day | INTRAMUSCULAR | Status: AC
Start: 2022-05-22 — End: 2022-05-23
  Administered 2022-05-22 – 2022-05-23 (×2): 40 mg via INTRAVENOUS
  Filled 2022-05-22 (×2): qty 4

## 2022-05-22 MED ORDER — METOPROLOL TARTRATE 50 MG PO TABS
100.0000 mg | ORAL_TABLET | Freq: Two times a day (BID) | ORAL | Status: DC
  Administered 2022-05-22 – 2022-05-24 (×4): 100 mg via ORAL
  Filled 2022-05-22 (×4): qty 2

## 2022-05-22 NOTE — Progress Notes (Signed)
Mobility Specialist - Progress Note    05/22/22 1400  Mobility  Activity Ambulated independently in room;Stood at bedside;Dangled on edge of bed  Level of Assistance Standby assist, set-up cues, supervision of patient - no hands on  Assistive Device None  Distance Ambulated (ft) 60 ft  Activity Response Tolerated well  $Mobility charge 1 Mobility    Pt semi supine upon arrival using 4L. Completes 3 bouts of 47f in room with supervision. Mild SOB noted, but denies dizziness or chest pain. HR remains 94-96 throughout. Pt is left with needs in reach.   MMerrily BrittleMobility Specialist 05/22/22, 2:18 PM

## 2022-05-22 NOTE — Progress Notes (Signed)
[facility] Cardiology  SUBJECTIVE: Patient sitting on side of the bed, reports having an episode tachycardia earlier this morning, improved after receiving dose of IV metoprolol and diltiazem   Vitals:   05/22/22 0616 05/22/22 0645 05/22/22 0646 05/22/22 0649  BP: 132/68 (!) 140/111 131/74 111/63  Pulse: (!) 107 (!) 106 (!) 120 (!) 125  Resp:      Temp:      TempSrc:      SpO2:  96%    Weight:      Height:         Intake/Output Summary (Last 24 hours) at 05/22/2022 0829 Last data filed at 05/22/2022 0157 Gross per 24 hour  Intake 793.33 ml  Output 1350 ml  Net -556.67 ml      PHYSICAL EXAM  General: Well developed, well nourished, in no acute distress HEENT:  Normocephalic and atramatic Neck:  No JVD.  Lungs: Clear bilaterally to auscultation and percussion. Heart: HRRR . Normal S1 and S2 without gallops or murmurs.  Abdomen: Bowel sounds are positive, abdomen soft and non-tender  Msk:  Back normal, normal gait. Normal strength and tone for age. Extremities: No clubbing, cyanosis or edema.   Neuro: Alert and oriented X 3. Psych:  Good affect, responds appropriately   LABS: Basic Metabolic Panel: Recent Labs    05/21/22 0432 05/22/22 0717  NA 141 139  K 3.2* 5.5*  CL 100 99  CO2 32 31  GLUCOSE 161* 225*  BUN 20 23  CREATININE 1.10 1.15  CALCIUM 8.6* 9.2  MG 2.4 2.2   Liver Function Tests: Recent Labs    05/19/22 1730  AST 18  ALT 17  ALKPHOS 63  BILITOT 0.5  PROT 6.9  ALBUMIN 3.6   No results for input(s): LIPASE, AMYLASE in the last 72 hours. CBC: Recent Labs    05/19/22 1730 05/21/22 0432  WBC 26.0* 17.6*  HGB 12.9* 13.5  HCT 41.4 42.5  MCV 92.0 90.2  PLT 305 320   Cardiac Enzymes: No results for input(s): CKTOTAL, CKMB, CKMBINDEX, TROPONINI in the last 72 hours. BNP: Invalid input(s): POCBNP D-Dimer: No results for input(s): DDIMER in the last 72 hours. Hemoglobin A1C: No results for input(s): HGBA1C in the last 72 hours. Fasting Lipid  Panel: No results for input(s): CHOL, HDL, LDLCALC, TRIG, CHOLHDL, LDLDIRECT in the last 72 hours. Thyroid Function Tests: No results for input(s): TSH, T4TOTAL, T3FREE, THYROIDAB in the last 72 hours.  Invalid input(s): FREET3 Anemia Panel: No results for input(s): VITAMINB12, FOLATE, FERRITIN, TIBC, IRON, RETICCTPCT in the last 72 hours.  No results found.   Echo EF 55 to 60% 05/16/2022  TELEMETRY: Atrial fibrillation 136 bpm:  ASSESSMENT AND PLAN:  Principal Problem:   Aspiration pneumonia ([place]) Active Problems:   Elevated troponin   HTN (hypertension)   GERD (gastroesophageal reflux disease)   Hypokalemia   Leukocytosis   Acute on chronic respiratory failure with hypoxemia (HCC)   Atrial fibrillation with RVR (HCC)   HLD (hyperlipidemia)   COPD (chronic obstructive pulmonary disease) (HCC)   Migraine   Paroxysmal atrial fibrillation with RVR (HCC)   Rheumatoid arthritis (HCC)   Chronic diastolic CHF (congestive heart failure) (HCC)   Severe sepsis (HCC)   Diarrhea    1. Paroxysmal atrial fibrillation with rapid ventricular rate, exacerbated by respiratory failure and aspiration pneumonia, heart rate improved after intravenous digoxin, metoprolol, and diltiazem.  Patient continues have intermittent episodes of atrial fibrillation with RVR, on Pradaxa for stroke prevention 2.  Recurrent  respiratory failure, chest x-ray consistent with aspiration pneumonia with [place] ptosis with white count 26,000.  Patient on broad-spectrum antibiotics. 3.  Coronary artery disease, occluded OM 2 by cardiac catheterization 2008, patient had mild episode of chest pain at 4 AM, currently chest pain-free, high-sensitivity troponin 71 and 73 4.  Chronic diastolic congestive heart failure, HFpEF, LVEF 55-60% 05/16/2022   Recommendations   1.  Agree with current plan 2.  Continue Pradaxa for stroke prevention 3.  Uptitrate Cardizem CD 300 mg daily 4.  Continue metoprolol tartrate 50 mg twice  daily 5.  No further cardiac diagnostics at this time   Isaias Cowman, MD, PhD, [address] [facility] 05/22/2022 8:29 AM

## 2022-05-22 NOTE — Progress Notes (Signed)
~  0530, entered room this am after noting what appeared to be Afib with RVR on monitor with rates in 130s. Patient was asleep at that time. Awakened patient to check vital signs, confirming tachycardia. Reported mild symptoms of vague discomfort and a preference for sitting upright rather than laying. Also c/o nausea. See flowsheets and MAR for all nursing assessments and med administrations.    Conferred with on-call Dr. Clearence Ped who agreed with already ordered prn IV metoprolol dose. This was given with limited effect. IV diltiazem also given per order, HR improved to 100s, still c/o similar symptoms.   EKG & orthostats performed as ordered. Also conferred with Charge RN Ria Comment.

## 2022-05-22 NOTE — Progress Notes (Addendum)
Progress Note   Patient: Darius Norris YPP:[id number] DOB: 1951-10-15 DOA: 05/19/2022     2 DOS: the patient was seen and examined on 05/22/2022   Brief hospital course: Mr. Zarion Oliff is a 71 year old male with history of COPD with chronic respiratory failure on 3 L nasal cannula continuously, atrial fibrillation, CAD, hypertension, GERD, who presented to the emergency department via EMS for chief concerns of shortness of breath.  Patient woke up in the morning, did not feel well, had a worsening short of breath and palpitation. Initial vitals in the emergency department showed temperature of 98.7, respiration rate of 25, heart rate of 110, blood pressure 157/87, SPO2 of 96% on 5 L nasal cannula. Chest x-ray and a CT scan showed bilateral lower lobe patchy infiltrates.  Consistent with aspiration pneumonia.  Patient antibiotic was changed to Unasyn. Patient also had atrial fibrillation with RVR.    Assessment and Plan: Acute on chronic hypoxemic respiratory failure. Severe sepsis secondary to aspiration pneumonia. Bilateral lower lobe aspiration pneumonia. Patient currently on 4 L oxygen, continue to wean oxygen. Continue Unasyn, total course of antibiotics 5 days. Aspiration could be due to acid reflux in the nighttime, but I will obtain speech therapy evaluation.    Paroxysmal atrial fibrillation with RVR. Mild elevation troponin. Chronic diastolic congestive heart failure Patient had another episode of tachycardia at 4 AM while asleep, heart rate reached about 160s.  Over the last few days, patient either has worsening short of breath early morning, or tachycardia in the early morning.  This could be due to 2 possibilities: Congestive heart failure with a paroxysmal active dyspnea which caused tachycardia.  Another possibility is aspiration in early morning. Discussed with cardiology, will give IV Lasix to see if we can prevent a.m. event.  Also obtain speech therapy evaluation.   Continue PPI. Due to tachycardia, patient will give a dose of digoxin again.  Also increase metoprolol.   Mild hypokalemia. Now patient developed mild hyperkalemia.  Recheck potassium level in p.m.   Rheumatoid arthritis.   Follow-up with PCP as outpatient.  Type 2 diabetes. Continue sliding scale insulin.   Diarrhea. Improved.      Subjective:  Patient feels well today, but developed secondary tachycardia this morning.  Short of breath still present, but improving. No chest pain.  Minimal cough.  Physical Exam: Vitals:   05/22/22 0646 05/22/22 0649 05/22/22 0800 05/22/22 1139  BP: 131/74 111/63  123/79  Pulse: (!) 120 (!) 125  95  Resp:   (!) 24 19  Temp:   98.1 F (36.7 C) (!) 97.5 F (36.4 C)  TempSrc:   Oral   SpO2:    93%  Weight:      Height:       General exam: Appears calm and comfortable  Respiratory system: A few crackles in the base. Respiratory effort normal. Cardiovascular system: S1 & S2 heard, RRR. No JVD, murmurs, rubs, gallops or clicks. No pedal edema. Gastrointestinal system: Abdomen is nondistended, soft and nontender. No organomegaly or masses felt. Normal bowel sounds heard. Central nervous system: Alert and oriented. No focal neurological deficits. Extremities: Symmetric 5 x 5 power. Skin: No rashes, lesions or ulcers Psychiatry: Judgement and insight appear normal. Mood & affect appropriate.   Data Reviewed:  Lab results reviewed.  Family Communication:   Disposition: Status is: Inpatient Remains inpatient appropriate because: Severity of disease, treatment with IV option.  Planned Discharge Destination: Home with Home Health    Time spent: 36 minutes  Author: Sharen Hones, MD 05/22/2022 2:07 PM  For on call review [email or web address].

## 2022-05-22 NOTE — Progress Notes (Signed)
Received report from Idamae Lusher, RN.

## 2022-05-23 DIAGNOSIS — J9621 Acute and chronic respiratory failure with hypoxia: Secondary | ICD-10-CM | POA: Diagnosis not present

## 2022-05-23 DIAGNOSIS — I4891 Unspecified atrial fibrillation: Secondary | ICD-10-CM | POA: Diagnosis not present

## 2022-05-23 DIAGNOSIS — J690 Pneumonitis due to inhalation of food and vomit: Secondary | ICD-10-CM | POA: Diagnosis not present

## 2022-05-23 LAB — GLUCOSE, CAPILLARY
Glucose-Capillary: 152 mg/dL — ABNORMAL HIGH (ref 70–99)
Glucose-Capillary: 124 mg/dL — ABNORMAL HIGH (ref 70–99)
Glucose-Capillary: 135 mg/dL — ABNORMAL HIGH (ref 70–99)
Glucose-Capillary: 138 mg/dL — ABNORMAL HIGH (ref 70–99)

## 2022-05-23 LAB — BASIC METABOLIC PANEL
Anion gap: 8 (ref 5–15)
BUN: 25 mg/dL — ABNORMAL HIGH (ref 8–23)
CO2: 30 mmol/L (ref 22–32)
Calcium: 8.5 mg/dL — ABNORMAL LOW (ref 8.9–10.3)
Chloride: 98 mmol/L (ref 98–111)
Creatinine, Ser: 1.05 mg/dL (ref 0.61–1.24)
GFR, Estimated: >60 mL/min (ref >60)
Glucose, Bld: 143 mg/dL — ABNORMAL HIGH (ref 70–99)
Potassium: 3.8 mmol/L (ref 3.5–5.1)
Sodium: 136 mmol/L (ref 135–145)

## 2022-05-23 LAB — MAGNESIUM: Magnesium: 2.2 mg/dL (ref 1.7–2.4)

## 2022-05-23 MED ORDER — FUROSEMIDE 40 MG PO TABS: 40.0000 mg | ORAL_TABLET | Freq: Every evening | ORAL | Status: DC

## 2022-05-23 MED ORDER — AMOXICILLIN-POT CLAVULANATE 875-125 MG PO TABS
1.0000 | ORAL_TABLET | Freq: Two times a day (BID) | ORAL | Status: DC
  Administered 2022-05-23 – 2022-05-24 (×3): 1 via ORAL
  Filled 2022-05-23 (×3): qty 1

## 2022-05-23 MED ORDER — FUROSEMIDE 40 MG PO TABS
40.0000 mg | ORAL_TABLET | Freq: Every evening | ORAL | Status: DC
  Administered 2022-05-23: 40 mg via ORAL
  Filled 2022-05-23: qty 1

## 2022-05-23 MED ORDER — SALINE SPRAY 0.65 % NA SOLN: 1.0000 | NASAL | Status: DC | PRN

## 2022-05-23 NOTE — Progress Notes (Addendum)
SLP Cancellation Note  Patient Details Name: Darius Norris MRN: [id number] DOB: 1951/06/15   Cancelled treatment:       Reason Eval/Treat Not Completed: SLP screened, no needs identified, will sign off (chart reviewed; consulted NSG then met w/ pt re: status) Pt denied any difficulty swallowing and is currently on a regular diet; tolerates swallowing pills w/ water per NSG. Pt stated he had a MBSS at the [place] a few months ago when he felt something "different" about his swallowing. It was assessed w/ No report of aspiration -- he was told the swallow was "normal". Pt stated the swallow study was discussed w/ him. NOted pt's chest imaging including "Chronic hyperinflation and advanced emphysema; patchy opacity in the lung bases". Dx'd GERD also. Pt had finished eating his breakfast meal and was drinking his coffee during this Screening Visit -- no overt coughing occurred while drinking the coffee sitting EOB. Noted pt is on [place] O2 support. Briefly discussed general aspiration precautions w/ pt.  Pt conversed in conversation w/out expressive/receptive deficits noted; pt denied any speech-language deficits. Speech clear. Pt denied need for BSE at this time. Discussed w/ MD who agreed and indicated concern for aspiration of REFLUX during the night. Recommend REFLUX precautions. No further skilled ST services indicated as pt appears at his baseline. Pt agreed. NSG to reconsult if any change in status while admitted.       Orinda Kenner, MS, CCC-SLP Speech Language Pathologist Rehab Services; [place] [phone] (ascom) Dalynn Jhaveri 05/23/2022, 8:48 AM

## 2022-05-23 NOTE — Progress Notes (Addendum)
[facility] Cardiology Patient ID: Darius Norris MRN: [id number] DOB/AGE: 23-Sep-1951 71 y.o.  Admit date: 05/19/2022 Referring Physician Roosevelt Locks Primary Physician 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 Cardiology consulted for assistance with AF RVR.   The patient was recently hospitalized 05/16/2022 for COPD exacerbation and paroxysmal left atrial fibrillation with rapid ventricular rate.  The patient was discharged home 05/18/2022 on home Pradaxa, Cardizem CD 240 mg daily, and metoprolol tartrate 50 mg twice daily.  He reports reexperiencing progressive shortness of breath on 05/19/2022 and presents today with recurrent respiratory failure, chest x-ray revealing bilateral lower lobe opacities consistent with aspiration pneumonia, leukocytosis with white count of 26,000, and atrial fibrillation with rapid ventricular rate.  Heart rate improved after 0.5 mg IV dig, 5 mg IV Lopressor, and 10 mg IV diltiazem.  Patient was restarted on home Pradaxa for stroke prevention, Cardizem CD 240 mg daily and metoprolol tartrate 50 mg twice daily for rate and rhythm control.  Heart rate currently 80 bpm.  Patient reports feeling better with less shortness of breath.  He denies chest pain.  Interval History: -feels better today, did not wake up with heart racing or shortness of breath today after IV lasix was dosed yesterday afternoon  -no chest pain, palpitations -remains in AF, mostly rate controlled.   Vitals:   05/23/22 0020 05/23/22 0342 05/23/22 0802 05/23/22 1224  BP: 107/69 (!) 107/59 140/79 94/60  Pulse: 91 81 94  82  Resp: '18 18 20 18  '$ Temp: 98.1 F (36.7 C) (!) 97.5 F (36.4 C) 97.7 F (36.5 C) 98.1 F (36.7 C)  TempSrc:   Oral   SpO2: 94% 98% 99% 96%  Weight:      Height:         Intake/Output Summary (Last 24 hours) at 05/23/2022 1414 Last data filed at 05/23/2022 0500 Gross per 24 hour  Intake 712.66 ml  Output 1300 ml  Net -587.34 ml       PHYSICAL EXAM  General: Pleasant elderly and conversational Caucasian male, well nourished, in no acute distress.  Sitting at edge of bed  HEENT:  Normocephalic and atraumatic. Neck:   No JVD.  Lungs: Normal respiratory effort on O2 by [place].  Decreased breath sounds bilaterally without appreciable crackles or wheezes.   Heart: Tachy irregular irregular. Normal S1 and S2 without gallops or murmurs.  Abdomen: Obese appearing.  Msk: Normal strength and tone for age. Extremities: No clubbing, cyanosis.  Cap refill brisk with erythema on bilateral lower extremities without edema.  Neuro: Alert and oriented X 3. Psych:  Answers questions appropriately.    LABS: Basic Metabolic Panel: Recent Labs    05/22/22 0717 05/22/22 2132 05/23/22 0548  NA 139  --  136  K 5.5* 4.3 3.8  CL 99  --  98  CO2 31  --  30  GLUCOSE 225*  --  143*  BUN 23  --  25*  CREATININE 1.15  --  1.05  CALCIUM 9.2  --  8.5*  MG 2.2  --  2.2    Liver Function Tests: No results for input(s): AST, ALT, ALKPHOS, BILITOT, PROT,  ALBUMIN in the last 72 hours.  No results for input(s): LIPASE, AMYLASE in the last 72 hours. CBC: Recent Labs    05/21/22 0432  WBC 17.6*  HGB 13.5  HCT 42.5  MCV 90.2  PLT 320    Cardiac Enzymes: No results for input(s): CKTOTAL, CKMB, CKMBINDEX, TROPONINI in the last 72 hours. BNP: Invalid input(s): POCBNP D-Dimer: No results for input(s): DDIMER in the last 72 hours. Hemoglobin A1C: No results for input(s): HGBA1C in the last 72 hours. Fasting Lipid Panel: No results for input(s): CHOL, HDL, LDLCALC, TRIG, CHOLHDL, LDLDIRECT  in the last 72 hours. Thyroid Function Tests: No results for input(s): TSH, T4TOTAL, T3FREE, THYROIDAB in the last 72 hours.  Invalid input(s): FREET3 Anemia Panel: No results for input(s): VITAMINB12, FOLATE, FERRITIN, TIBC, IRON, RETICCTPCT in the last 72 hours.  No results found.   Echo EF 55 to 60% 05/16/2022  TELEMETRY: Atrial fibrillation 136 bpm:  ASSESSMENT AND PLAN:  Principal Problem:   Aspiration pneumonia ([place]) Active Problems:   Elevated troponin   HTN (hypertension)   GERD (gastroesophageal reflux disease)   Hypokalemia   Leukocytosis   Acute on chronic respiratory failure with hypoxemia (HCC)   Atrial fibrillation with RVR (HCC)   HLD (hyperlipidemia)   COPD (chronic obstructive pulmonary disease) (HCC)   Migraine   Paroxysmal atrial fibrillation with RVR (HCC)   Rheumatoid arthritis (HCC)   Chronic diastolic CHF (congestive heart failure) (HCC)   Severe sepsis (HCC)   Diarrhea    1. Paroxysmal atrial fibrillation with rapid ventricular rate, exacerbated by respiratory failure and aspiration pneumonia, heart rate improved after intravenous digoxin, metoprolol, and diltiazem.  Patient continues have intermittent episodes of atrial fibrillation with RVR, on Pradaxa for stroke prevention 2.  Recurrent respiratory failure, chest x-ray consistent with aspiration pneumonia vs residual COPD exacerbation with white count 26,000.  Patient on broad-spectrum antibiotics. 3.  Coronary artery disease, occluded OM 2 by cardiac catheterization 2008, patient had mild episode of chest pain at 4 AM, currently chest pain-free, high-sensitivity troponin 71 and 73 4.  Chronic diastolic congestive heart failure, HFpEF, LVEF 55-60% 05/16/2022   Recommendations   1.  Agree with current plan 2.  Continue Pradaxa for stroke prevention 3.  Continue Cardizem CD 300 mg daily 4.  Continue metoprolol tartrate '100mg'$  twice daily  5.  Continue nightly lasix '40mg'$   6.  No further cardiac  diagnostics at this time 7.  Likely ok for discharge from a cardiac standpoint tomorrow, close follow up with Dr. Saralyn Pilar in 1-2 weeks.   This patient's plan of care was discussed and created with Dr. Nehemiah Massed and he is in agreement.    Tristan Schroeder, PA-C  05/23/2022 2:14 PM

## 2022-05-23 NOTE — Progress Notes (Signed)
Progress Note   Patient: Darius Norris TKW:[id number] DOB: May 23, 1951 DOA: 05/19/2022     3 DOS: the patient was seen and examined on 05/23/2022   Brief hospital course: [name] is a 71 year old male with history of COPD with chronic respiratory failure on 3 L nasal cannula continuously, atrial fibrillation, CAD, hypertension, GERD, who presented to the emergency department via EMS for chief concerns of shortness of breath.  Patient woke up in the morning, did not feel well, had a worsening short of breath and palpitation. Initial vitals in the emergency department showed temperature of 98.7, respiration rate of 25, heart rate of 110, blood pressure 157/87, SPO2 of 96% on 5 L nasal cannula. Chest x-ray and a CT scan showed bilateral lower lobe patchy infiltrates.  Consistent with aspiration pneumonia.  Patient antibiotic was changed to Unasyn. Patient also had atrial fibrillation with RVR. Patient had recurrent RVR in the hospital, mostly in the early morning.  Consider aspiration due to as reflex versus CHF.  He was given IV Lasix yesterday, treated with antibiotics.  No meal after 6 PM.  PPI.  He did not have any problem last night, we will keep patient for another day, potentially can discharge home tomorrow if no recurrence of short of breath/tachycardia in early morning.    Assessment and Plan:  Acute on chronic hypoxemic respiratory failure. Severe sepsis secondary to aspiration pneumonia. Bilateral lower lobe aspiration pneumonia  Paroxysmal atrial fibrillation with RVR. Mild elevation troponin. Chronic diastolic congestive heart failure Patient was recently admitted to the hospital, came back again with episode of aspiration pneumonia.  Over the course of hospital stay, we found out that he seemed to have more problems in early morning.  Either he was short of breath or with tachycardic. He was given IV Lasix yesterday, which has prevented early morning tachycardia/shortness  of breath. Condition could be due to acid reflux with aspiration versus congestive heart failure symptoms. Patient condition had improved today.  I will continue oral Lasix every p.m.  Also advised patient not eating after 6 PM, continue PPI. Patient also be evaluated by speech therapy, does not have any risk of aspiration from eating.   Mild hypokalemia. Improved   Rheumatoid arthritis.   Follow-up with PCP as outpatient.  Type 2 diabetes. Continue sliding scale insulin.   Diarrhea. Improved. Prn imodium.      Subjective:  Patient slept well last night, did not have any short of breath or tachycardia last night. He felt overall improved  Physical Exam: Vitals:   05/22/22 2039 05/23/22 0020 05/23/22 0342 05/23/22 0802  BP: 122/70 107/69 (!) 107/59 140/79  Pulse: 90 91 81 94  Resp: '20 18 18 20  '$ Temp: 98.3 F (36.8 C) 98.1 F (36.7 C) (!) 97.5 F (36.4 C) 97.7 F (36.5 C)  TempSrc:    Oral  SpO2: 98% 94% 98% 99%  Weight:      Height:       General exam: Appears calm and comfortable  Respiratory system: Clear to auscultation. Respiratory effort normal. Cardiovascular system: Irregular. No JVD, murmurs, rubs, gallops or clicks. No pedal edema. Gastrointestinal system: Abdomen is nondistended, soft and nontender. No organomegaly or masses felt. Normal bowel sounds heard. Central nervous system: Alert and oriented. No focal neurological deficits. Extremities: Symmetric 5 x 5 power. Skin: No rashes, lesions or ulcers Psychiatry: Judgement and insight appear normal. Mood & affect appropriate.   Data Reviewed:  Lab results reviewed.  Family Communication:   Disposition:  Status is: Inpatient Remains inpatient appropriate because: Severity of disease,  Planned Discharge Destination: Home    Time spent: 35 minutes  Author: Sharen Hones, MD 05/23/2022 11:57 AM  For on call review [email or web address].

## 2022-05-24 ENCOUNTER — Other Ambulatory Visit: Payer: Self-pay

## 2022-05-24 DIAGNOSIS — J690 Pneumonitis due to inhalation of food and vomit: Secondary | ICD-10-CM | POA: Diagnosis not present

## 2022-05-24 LAB — CULTURE, BLOOD (ROUTINE X 2)
Culture: NO GROWTH
Culture: NO GROWTH
Special Requests: ADEQUATE
Special Requests: ADEQUATE

## 2022-05-24 LAB — GLUCOSE, CAPILLARY: Glucose-Capillary: 101 mg/dL — ABNORMAL HIGH (ref 70–99)

## 2022-05-24 MED ORDER — FOLIC ACID 1 MG PO TABS: 1.0000 mg | ORAL_TABLET | Freq: Every day | ORAL | 2 refills | Status: AC

## 2022-05-24 MED ORDER — METOPROLOL TARTRATE 100 MG PO TABS: 100.0000 mg | ORAL_TABLET | Freq: Two times a day (BID) | ORAL | 2 refills | Status: DC

## 2022-05-24 MED ORDER — DILTIAZEM HCL ER COATED BEADS 300 MG PO CP24: 300.0000 mg | ORAL_CAPSULE | Freq: Every day | ORAL | 2 refills | Status: DC

## 2022-05-24 MED ORDER — FUROSEMIDE 40 MG PO TABS: 40.0000 mg | ORAL_TABLET | Freq: Every day | ORAL | 2 refills | Status: DC

## 2022-05-24 NOTE — TOC Transition Note (Signed)
Transition of Care [facility]) - CM/SW Discharge Note   Patient Details  Name: Darius Norris MRN: [id number] Date of Birth: 11-27-1951  Transition of [facility] CM/SW Contact:  Laurena Slimmer, RN Phone Number: 05/24/2022, 10:17 AM   Clinical Narrative:    Spoke with patient regarding his discharge home today. Patient's wife will transport him home. Patient is aware his home oxygen needs to be brought to the hospital for discharge. Patient denied any other questions.          Patient Goals and CMS Choice        Discharge Placement                       Discharge Plan and Services                                     Social Determinants of Health (SDOH) Interventions     Readmission Risk Interventions     View : No data to display.

## 2022-05-24 NOTE — Progress Notes (Signed)
[facility] Cardiology Patient ID: ALIJA RIANO MRN: [id number] DOB/AGE: [birth year] 71 y.o.  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 Cardiology consulted for assistance with AF RVR.   The patient was recently hospitalized 05/16/2022 for COPD exacerbation and paroxysmal left atrial fibrillation with rapid ventricular rate.  The patient was discharged home 05/18/2022 on home Pradaxa, Cardizem CD 240 mg daily, and metoprolol tartrate 50 mg twice daily.  He reports reexperiencing progressive shortness of breath on 05/19/2022 and presents today with recurrent respiratory failure, chest x-ray revealing bilateral lower lobe opacities consistent with aspiration pneumonia, leukocytosis with white count of 26,000, and atrial fibrillation with rapid ventricular rate.  Heart rate improved after 0.5 mg IV dig, 5 mg IV Lopressor, and 10 mg IV diltiazem.  Patient was restarted on home Pradaxa for stroke prevention, Cardizem CD 240 mg daily and metoprolol tartrate 50 mg twice daily for rate and rhythm control.  Heart rate currently 80 bpm.  Patient reports feeling better with less shortness of breath.  He denies chest pain.  Interval History: -converted to sinus rhythm overnight -feeling well, had transient stabbing chest discomfort while he was eating that resolved -breathing better, no palpitaitons    Vitals:   05/23/22 2357 05/24/22 0346 05/24/22 0500 05/24/22 0728  BP: 112/75 (!) 148/71  (!) 158/79  Pulse: (!) 107 (!) 106  77  Resp: '19 19  18  '$ Temp:  98.2 F (36.8 C) 97.6 F (36.4 C)  97.6 F (36.4 C)  TempSrc: Oral Oral    SpO2: 93% 93%  100%  Weight:   81 kg   Height:   '5\' 6"'$  (1.676 m)      Intake/Output Summary (Last 24 hours) at 05/24/2022 3335 Last data filed at 05/24/2022 0700 Gross per 24 hour  Intake 480 ml  Output 1150 ml  Net -670 ml       PHYSICAL EXAM  General: Pleasant elderly and conversational Caucasian male, well nourished, in no acute distress.  Sitting at edge of bed  HEENT:  Normocephalic and atraumatic. Neck:   No JVD.  Lungs: Normal respiratory effort on O2 by [place].  Decreased breath sounds bilaterally without appreciable crackles or wheezes.   Heart: Tachy irregular irregular. Normal S1 and S2 without gallops or murmurs.  Abdomen: Obese appearing.  Msk: Normal strength and tone for age. Extremities: No clubbing, cyanosis.  Cap refill brisk with erythema on bilateral lower extremities without edema.  Neuro: Alert and oriented X 3. Psych:  Answers questions appropriately.    LABS: Basic Metabolic Panel: Recent Labs    05/22/22 0717 05/22/22 2132 05/23/22 0548  NA 139  --  136  K 5.5* 4.3 3.8  CL 99  --  98  CO2 31  --  30  GLUCOSE 225*  --  143*  BUN 23  --  25*  CREATININE 1.15  --  1.05  CALCIUM 9.2  --  8.5*  MG 2.2  --  2.2    Liver Function Tests: No results for input(s): AST, ALT, ALKPHOS, BILITOT, PROT, ALBUMIN in the last 72 hours.  No results for input(s): LIPASE, AMYLASE in the last 72 hours. CBC: No results for input(s): WBC, NEUTROABS, HGB, HCT, MCV, PLT in the last 72 hours.  Cardiac Enzymes: No results for input(s): CKTOTAL, CKMB, CKMBINDEX, TROPONINI in the last 72 hours. BNP: Invalid input(s): POCBNP D-Dimer: No results for input(s): DDIMER in the last 72 hours. Hemoglobin A1C: No results for input(s): HGBA1C in the last 72 hours. Fasting Lipid Panel: No results for input(s): CHOL, HDL, LDLCALC, TRIG, CHOLHDL, LDLDIRECT in the last 72 hours. Thyroid Function  Tests: No results for input(s): TSH, T4TOTAL, T3FREE, THYROIDAB in the last 72 hours.  Invalid input(s): FREET3 Anemia Panel: No results for input(s): VITAMINB12, FOLATE, FERRITIN, TIBC, IRON, RETICCTPCT in the last 72 hours.  No results found.   Echo EF 55 to 60% 05/16/2022  TELEMETRY: Atrial fibrillation 136 bpm:  ASSESSMENT AND PLAN:  Principal Problem:   Aspiration pneumonia ([place]) Active Problems:   Elevated troponin   HTN (hypertension)   GERD (gastroesophageal reflux disease)   Hypokalemia   Leukocytosis   Acute on chronic respiratory failure with hypoxemia (HCC)   Atrial fibrillation with RVR (HCC)   HLD (hyperlipidemia)   COPD (chronic obstructive pulmonary disease) (HCC)   Migraine   Paroxysmal atrial fibrillation with RVR (HCC)   Rheumatoid arthritis (HCC)   Chronic diastolic CHF (congestive heart failure) (HCC)   Severe sepsis (HCC)   Diarrhea    1. Paroxysmal atrial fibrillation with rapid ventricular rate, exacerbated by respiratory failure and aspiration pneumonia, heart rate improved after intravenous digoxin, metoprolol, and diltiazem.  Patient continues have intermittent episodes of atrial fibrillation with RVR, on Pradaxa for stroke prevention 2.  Recurrent respiratory failure, chest x-ray consistent with aspiration pneumonia vs residual COPD exacerbation with white count 26,000.  Patient on broad-spectrum antibiotics. 3.  Coronary artery disease, occluded OM 2 by cardiac catheterization 2008, patient had mild episode of chest pain at 4 AM, currently chest pain-free, high-sensitivity troponin 71 and 73 4.  Chronic diastolic congestive heart failure, HFpEF, LVEF 55-60% 05/16/2022   Recommendations   1.  Agree with current plan 2.  Continue Pradaxa for stroke prevention 3.  Continue Cardizem CD 300 mg daily 4.  Continue metoprolol tartrate '100mg'$  twice daily  5.  Continue nightly lasix '40mg'$   6.  No further cardiac diagnostics at this time 7.  ok for  discharge from a cardiac standpoint today, close follow up with Dr. Saralyn Pilar in 1-2 weeks.   This patient's plan of care was discussed and created with Dr. Nehemiah Massed and he is in agreement.    Tristan Schroeder, PA-C  05/24/2022 8:22 AM

## 2022-05-24 NOTE — Discharge Summary (Signed)
Physician Discharge Summary   YUMA PACELLA  male DOB: [birth year]  ZPH:[id number]  PCP: [facility]  Admit date: 05/19/2022 Discharge date: 05/24/2022  Admitted From: home Disposition:  home CODE STATUS: Full code   Hospital Course:  For full details, please see H&P, progress notes, consult notes and ancillary notes.  Briefly,  [name] is a 71 year old male with history of COPD with chronic respiratory failure on 3 L nasal cannula continuously, atrial fibrillation, CAD, hypertension, GERD, who presented to the emergency department via EMS for chief concerns of shortness of breath a day after he was discharged from the hospital.  Patient woke up in the morning, did not feel well, had a worsening short of breath and palpitation.  Acute on chronic hypoxemic respiratory failure Severe sepsis secondary to aspiration pneumonia. Bilateral lower lobe aspiration pneumonia Chronic diastolic congestive heart failure Patient was recently admitted to the hospital, came back again with episode of aspiration pneumonia.  Received 3 days of ceftriaxone, 1 day of vanc/cefepime, 3 days of Unasyn and 2 days of Augmentin. Over the course of hospital stay, we found out that he seemed to have more problems in early morning.  Either he was short of breath or with tachycardic. He was given IV Lasix, which has prevented early morning tachycardia/shortness of breath. Condition could be due to silent acid reflux with aspiration versus congestive heart failure symptoms. Patient also evaluated by speech therapy, does not have any risk of aspiration from eating. --pt discharged on oral lasix 40 mg daily --cont home Omeprazole   Paroxysmal atrial fibrillation with RVR Increased home cardizem and metop Continue Pradaxa for stroke prevention close follow up with Dr. Saralyn Pilar in 1-2 weeks.   Mild hypokalemia. Improved   Rheumatoid arthritis.   --cont home methotrexate and folic  acid  Type 2 diabetes, well controlled --resume metformin after discharge.   COPD with chronic respiratory failure on 3 L --was recently treated for COPD exacerbation and discharged on 05/18/22 with prednisone taper, which pt didn't finish before returning to hospital.   --cont bronchodilator regimen as below, and finish prednisone taper.  Mild elevation troponin 2/2 demand ischemia   Discharge Diagnoses:  Principal Problem:   Aspiration pneumonia ([place]) Active Problems:   Paroxysmal atrial fibrillation with RVR (HCC)   HTN (hypertension)   GERD (gastroesophageal reflux disease)   Hypokalemia   Elevated troponin   Leukocytosis   Acute on chronic respiratory failure with hypoxemia (HCC)   Atrial fibrillation with RVR (HCC)   HLD (hyperlipidemia)   COPD (chronic obstructive pulmonary disease) (HCC)   Migraine   Rheumatoid arthritis (HCC)   Chronic diastolic CHF (congestive heart failure) (HCC)   Severe sepsis ([place])   Diarrhea   30 Day Unplanned Readmission Risk Score    Flowsheet Row ED to Hosp-Admission (Current) from 05/19/2022 in [facility]  30 Day Unplanned Readmission Risk Score (%) 27.87 Filed at 05/24/2022 0801       This score is the patient's risk of an unplanned readmission within 30 days of being discharged (0 -100%). The score is based on dignosis, age, lab data, medications, orders, and past utilization.   Low:  0-14.9   Medium: 15-21.9   High: 22-29.9   Extreme: 30 and above         Discharge Instructions:  Allergies as of 05/24/2022       Reactions   Ranitidine Hcl Shortness Of Breath   Zantac [ranitidine Hcl] Shortness Of Breath  Codeine Itching   Other reaction(s): PRURITIS   Erythromycin Hives   Other reaction(s): SWELLING (NON-SPECIFIC), HIVES, SWELLING (NON-SPECIFIC), HIVES   Hydrocodone    Other reaction(s): PRURITIS   Morphine Itching   Morphine And Related Itching   Tiotropium Bromide Monohydrate     Hydrocodone-acetaminophen Rash   Vicodin [hydrocodone-acetaminophen] Rash   Zantac [ranitidine] Rash   Other reaction(s): Eruption, HIVES, SHORTNESS OF BREATH, Eruption, HIVES, SHORTNESS OF BREATH        Medication List     STOP taking these medications    cefdinir 300 MG capsule Commonly known as: OMNICEF   losartan 25 MG tablet Commonly known as: COZAAR       TAKE these medications    acetaminophen 325 MG tablet Commonly known as: TYLENOL Take 650 mg by mouth every 6 (six) hours as needed.   albuterol 108 (90 Base) MCG/ACT inhaler Commonly known as: VENTOLIN HFA Inhale into the lungs.   aspirin EC 81 MG tablet Take 1 tablet (81 mg total) by mouth daily.   atorvastatin 80 MG tablet Commonly known as: LIPITOR Take 1 tablet (80 mg total) by mouth at bedtime.   budesonide-formoterol 160-4.5 MCG/ACT inhaler Commonly known as: SYMBICORT Inhale 2 puffs into the lungs 2 (two) times daily.   busPIRone 10 MG tablet Commonly known as: BUSPAR Take by mouth.   clobetasol 0.05 % external solution Commonly known as: TEMOVATE Apply topically.   dabigatran 150 MG Caps capsule Commonly known as: PRADAXA Take 150 mg by mouth 2 (two) times daily.   diltiazem 300 MG 24 hr capsule Commonly known as: CARDIZEM CD Take 1 capsule (300 mg total) by mouth daily. What changed:  medication strength how much to take   folic acid 1 MG tablet Commonly known as: FOLVITE Take 1 tablet (1 mg total) by mouth daily. What changed:  how much to take when to take this   furosemide 40 MG tablet Commonly known as: LASIX Take 1 tablet (40 mg total) by mouth daily.   gabapentin 300 MG capsule Commonly known as: NEURONTIN Take 1 capsule by mouth 3 (three) times daily as needed.   guaifenesin 400 MG Tabs tablet Commonly known as: HUMIBID E Take by mouth.   hydrocortisone 2.5 % cream Apply topically.   hydrOXYzine 25 MG tablet Commonly known as: ATARAX Take by mouth.    Ipratropium-Albuterol 20-100 MCG/ACT Aers respimat Commonly known as: COMBIVENT Inhale 1 puff into the lungs every 6 (six) hours as needed for wheezing or shortness of breath.   isosorbide mononitrate 60 MG 24 hr tablet Commonly known as: IMDUR Take 60 mg by mouth daily.   ketorolac 0.5 % ophthalmic solution Commonly known as: ACULAR INSTILL 1 DROP OPERATIVE EYE FOUR TIMES A DAY FOR POSTOPERATIVE PAIN/INFLAMMATION SEND TO 4B AMB SURGERY OR ON DAY OF SURGERY 04/05/22   lidocaine 5 % ointment Commonly known as: XYLOCAINE Apply topically.   loratadine 10 MG tablet Commonly known as: CLARITIN Take 10 mg by mouth daily as needed for allergies.   Melatonin 3 MG Caps Take 2 capsules by mouth at bedtime.   metFORMIN 500 MG 24 hr tablet Commonly known as: GLUCOPHAGE-XR Take 500 mg by mouth in the morning and at bedtime.   methotrexate 2.5 MG tablet Commonly known as: RHEUMATREX Take 10 mg by mouth once a week.   metoprolol tartrate 100 MG tablet Commonly known as: LOPRESSOR Take 1 tablet (100 mg total) by mouth 2 (two) times daily. What changed:  medication strength how much  to take when to take this   mometasone 220 MCG/INH inhaler Commonly known as: ASMANEX Take by mouth.   moxifloxacin 0.5 % ophthalmic solution Commonly known as: VIGAMOX INSTILL 1 DROP IN LEFT EYE FOUR TIMES A DAY FOR CATARACT SURGERY SEND TO 4B AMB SURGERY OR ON DAY OF SURGERY 04/05/22   predniSONE 20 MG tablet Commonly known as: DELTASONE Take 1 tablet (20 mg total) by mouth daily with breakfast for 3 days, THEN 0.5 tablets (10 mg total) daily with breakfast for 3 days. Start taking on: May 19, 2022   senna 8.6 MG Tabs tablet Commonly known as: SENOKOT Take 1 tablet by mouth daily as needed for mild constipation.   simethicone 80 MG chewable tablet Commonly known as: MYLICON CHEW ONE TABLET BY MOUTH THREE TIMES A DAY AFTER MEALS AS NEEDED FOR GAS         Follow-up Information      Paraschos, Alexander, MD. Go in 1 week(s).   Specialty: Cardiology Contact information: [facility] [place] [address] [phone]                 Allergies  Allergen Reactions   Ranitidine Hcl Shortness Of Breath   Zantac [Ranitidine Hcl] Shortness Of Breath   Codeine Itching    Other reaction(s): PRURITIS   Erythromycin Hives    Other reaction(s): SWELLING (NON-SPECIFIC), HIVES, SWELLING (NON-SPECIFIC), HIVES   Hydrocodone     Other reaction(s): PRURITIS   Morphine Itching   Morphine And Related Itching   Tiotropium Bromide Monohydrate    Hydrocodone-Acetaminophen Rash   Vicodin [Hydrocodone-Acetaminophen] Rash   Zantac [Ranitidine] Rash    Other reaction(s): Eruption, HIVES, SHORTNESS OF BREATH, Eruption, HIVES, SHORTNESS OF BREATH     The results of significant diagnostics from this hospitalization (including imaging, microbiology, ancillary and laboratory) are listed below for reference.   Consultations:   Procedures/Studies: CT CHEST ABDOMEN PELVIS W CONTRAST  Result Date: 05/19/2022 CLINICAL DATA:  Sepsis EXAM: CT CHEST, ABDOMEN, AND PELVIS WITH CONTRAST TECHNIQUE: Multidetector CT imaging of the chest, abdomen and pelvis was performed following the standard protocol during bolus administration of intravenous contrast. RADIATION DOSE REDUCTION: This exam was performed according to the departmental dose-optimization program which includes automated exposure control, adjustment of the mA and/or kV according to patient size and/or use of iterative reconstruction technique. CONTRAST:  169m OMNIPAQUE IOHEXOL 300 MG/ML  SOLN COMPARISON:  Chest x-ray 05/19/2022, CT chest 02/04/2013, CT abdomen pelvis 12/11/2017 FINDINGS: CT CHEST FINDINGS Cardiovascular: Nonaneurysmal aorta. Moderate aortic atherosclerosis. Normal cardiac size. Trace pericardial effusion Mediastinum/Nodes: Midline trachea. No thyroid mass. No suspicious lymph  nodes. Esophagus within normal limits Lungs/Pleura: Emphysema. No pleural effusion or pneumothorax. Irregular densities and patchy consolidations greatest within the bilateral lung bases, right middle lobe, and lingula. Lesser involvement of right upper lobe. Musculoskeletal: Sternum is intact.  No acute osseous abnormality CT ABDOMEN PELVIS FINDINGS Hepatobiliary: Status post cholecystectomy. No focal hepatic abnormality or biliary dilatation. Pancreas: Unremarkable. No pancreatic ductal dilatation or surrounding inflammatory changes. Spleen: Normal in size without focal abnormality. Adrenals/Urinary Tract: Adrenal glands are within normal limits. Kidneys show no hydronephrosis. Subcentimeter hypodensity mid right kidney too small to further characterize. Exophytic cyst lower pole left kidney, no follow-up imaging recommended. Bladder is unremarkable. Stomach/Bowel: Stomach nonenlarged. No dilated small bowel. Fluid within the colon. No acute bowel wall thickening. Negative appendix Vascular/Lymphatic: Moderate aortic atherosclerosis. No aneurysm. Borderline gastrohepatic lymph node measuring 10 mm. Reproductive: Prostate is unremarkable. Other: Negative for  pelvic effusion or free air. Prior right groin hernia repair. There are small fat containing inguinal hernias. Musculoskeletal: No acute osseous abnormality IMPRESSION: 1. Irregular airspace disease and patchy consolidations greatest within the bilateral lung bases, right middle lobe and lingula, suspicious for pneumonia. 2. Emphysema 3. Fluid throughout the colon suggesting diarrheal illness. No bowel wall thickening. Otherwise no CT evidence for acute intra-abdominal or pelvic abnormality. Electronically Signed   By: Donavan Foil M.D.   On: 05/19/2022 20:46   DG Chest Portable 1 View  Result Date: 05/19/2022 CLINICAL DATA:  Shortness of breath. Patient reports COPD exacerbation with progressive shortness of breath EXAM: PORTABLE CHEST 1 VIEW COMPARISON:   Radiograph 05/16/2022 FINDINGS: Chronic hyperinflation with advanced emphysema. Progressive patchy opacity in both lung bases. Stable heart size and mediastinal contours. No pneumothorax or large pleural effusion. Stable osseous structures. IMPRESSION: 1. Progressive patchy opacity in the lung bases, suspicious for pneumonia. Given distribution, aspiration is considered. Recommend aspiration risk factor assessment. 2. Chronic hyperinflation and advanced emphysema. Electronically Signed   By: Keith Rake M.D.   On: 05/19/2022 17:54   DG Chest Port 1 View  Result Date: 05/16/2022 CLINICAL DATA:  Chest pain. EXAM: PORTABLE CHEST 1 VIEW COMPARISON:  February 22, 2022. FINDINGS: The heart size and mediastinal contours are within normal limits. Mild bibasilar atelectasis or infiltrates are noted. Emphysematous disease is noted in the upper lobes. The visualized skeletal structures are unremarkable. IMPRESSION: Mild bibasilar subsegmental atelectasis or infiltrates are noted. Emphysema (ICD10-J43.9). Electronically Signed   By: Marijo Conception M.D.   On: 05/16/2022 10:09   ECHOCARDIOGRAM COMPLETE  Result Date: 05/17/2022    ECHOCARDIOGRAM REPORT   Patient Name:   MADDOCK FINIGAN Date of Exam: 05/16/2022 Medical Rec #:  [id number]       Height:       66.0 in Accession #:    [id number]      Weight:       198.0 lb Date of Birth:  Jul 12, 1951       BSA:          1.991 m Patient Age:    70 years        BP:           113/95 mmHg Patient Gender: M               HR:           121 bpm. Exam Location:  [facility] Procedure: 2D Echo, Color Doppler and Cardiac Doppler Indications:     I48.91 Atrial Fibrillation  History:         Patient has prior history of Echocardiogram examinations.                  Previous Myocardial Infarction and CAD, COPD and Emphysema,                  Arrythmias:Atrial Fibrillation; Risk Factors:Hypertension,                  Diabetes and Dyslipidemia.  Sonographer:     Rosalia Hammers Referring Phys:  [id number]  [id number] AGBATA Diagnosing Phys: Isaias Cowman MD  Sonographer Comments: Technically difficult study due to poor echo windows. Image acquisition challenging due to patient body habitus, Image acquisition challenging due to COPD and Image acquisition challenging due to respiratory motion. IMPRESSIONS  1. Left ventricular ejection fraction, by estimation, is 55 to 60%. The left ventricle has normal function. The left ventricle has no regional wall  motion abnormalities. There is mild left ventricular hypertrophy. Left ventricular diastolic parameters are indeterminate.  2. Right ventricular systolic function is normal. The right ventricular size is normal.  3. The mitral valve is normal in structure. Mild mitral valve regurgitation. No evidence of mitral stenosis.  4. Tricuspid valve regurgitation is mild to moderate.  5. The aortic valve is normal in structure. Aortic valve regurgitation is not visualized. No aortic stenosis is present.  6. The inferior vena cava is normal in size with greater than 50% respiratory variability, suggesting right atrial pressure of 3 mmHg. FINDINGS  Left Ventricle: Left ventricular ejection fraction, by estimation, is 55 to 60%. The left ventricle has normal function. The left ventricle has no regional wall motion abnormalities. The left ventricular internal cavity size was normal in size. There is  mild left ventricular hypertrophy. Left ventricular diastolic parameters are indeterminate. Right Ventricle: The right ventricular size is normal. No increase in right ventricular wall thickness. Right ventricular systolic function is normal. Left Atrium: Left atrial size was normal in size. Right Atrium: Right atrial size was normal in size. Pericardium: There is no evidence of pericardial effusion. Mitral Valve: The mitral valve is normal in structure. Mild mitral valve regurgitation. No evidence of mitral valve stenosis. MV peak gradient, 3.6 mmHg. The mean mitral valve gradient is  1.0 mmHg. Tricuspid Valve: The tricuspid valve is normal in structure. Tricuspid valve regurgitation is mild to moderate. No evidence of tricuspid stenosis. Aortic Valve: The aortic valve is normal in structure. Aortic valve regurgitation is not visualized. No aortic stenosis is present. Aortic valve mean gradient measures 3.0 mmHg. Aortic valve peak gradient measures 6.6 mmHg. Aortic valve area, by VTI measures 2.20 cm. Pulmonic Valve: The pulmonic valve was normal in structure. Pulmonic valve regurgitation is not visualized. No evidence of pulmonic stenosis. Aorta: The aortic root is normal in size and structure. Venous: The inferior vena cava is normal in size with greater than 50% respiratory variability, suggesting right atrial pressure of 3 mmHg. IAS/Shunts: No atrial level shunt detected by color flow Doppler.  LEFT VENTRICLE PLAX 2D LVIDd:         4.05 cm LVIDs:         2.82 cm LV PW:         1.32 cm LV IVS:        1.40 cm LVOT diam:     1.80 cm LV SV:         47 LV SV Index:   24 LVOT Area:     2.54 cm  RIGHT VENTRICLE RV Basal diam:  3.52 cm LEFT ATRIUM             Index        RIGHT ATRIUM           Index LA diam:        3.10 cm 1.56 cm/m   RA Area:     15.10 cm LA Vol (A2C):   53.5 ml 26.87 ml/m  RA Volume:   39.70 ml  19.94 ml/m LA Vol (A4C):   36.0 ml 18.08 ml/m LA Biplane Vol: 44.3 ml 22.25 ml/m  AORTIC VALVE                    PULMONIC VALVE AV Area (Vmax):    2.60 cm     PV Vmax:       1.55 m/s AV Area (Vmean):   2.46 cm     PV Vmean:  95.600 cm/s AV Area (VTI):     2.20 cm     PV VTI:        0.204 m AV Vmax:           128.00 cm/s  PV Peak grad:  9.6 mmHg AV Vmean:          82.700 cm/s  PV Mean grad:  5.0 mmHg AV VTI:            0.213 m AV Peak Grad:      6.6 mmHg AV Mean Grad:      3.0 mmHg LVOT Vmax:         131.00 cm/s LVOT Vmean:        80.000 cm/s LVOT VTI:          0.184 m LVOT/AV VTI ratio: 0.86  AORTA Ao Root diam: 4.00 cm MITRAL VALVE MV Area (PHT): 3.89 cm    SHUNTS MV Area  VTI:   2.53 cm    Systemic VTI:  0.18 m MV Peak grad:  3.6 mmHg    Systemic Diam: 1.80 cm MV Mean grad:  1.0 mmHg MV Vmax:       0.95 m/s MV Vmean:      48.7 cm/s MV Decel Time: 195 msec MV E velocity: 84.20 cm/s Isaias Cowman MD Electronically signed by Isaias Cowman MD Signature Date/Time: 05/17/2022/1:23:20 PM    Final       Labs: BNP (last 3 results) Recent Labs    02/22/22 1352 05/16/22 0954 05/19/22 1730  BNP 258.6* 209.8* 829.5*   Basic Metabolic Panel: Recent Labs  Lab 05/18/22 0619 05/19/22 1730 05/20/22 0450 05/21/22 0432 05/22/22 0717 05/22/22 2132 05/23/22 0548  NA 140 136 141 141 139  --  136  K 4.1 3.4* 4.3 3.2* 5.5* 4.3 3.8  CL 102 98 102 100 99  --  98  CO2 32 30 31 32 31  --  30  GLUCOSE 139* 389* 259* 161* 225*  --  143*  BUN '20 19 14 20 23  '$ --  25*  CREATININE 0.83 1.02 0.97  0.94 1.10 1.15  --  1.05  CALCIUM 9.2 8.4* 8.8* 8.6* 9.2  --  8.5*  MG 2.2  --  2.8* 2.4 2.2  --  2.2   Liver Function Tests: Recent Labs  Lab 05/19/22 1730  AST 18  ALT 17  ALKPHOS 63  BILITOT 0.5  PROT 6.9  ALBUMIN 3.6   No results for input(s): LIPASE, AMYLASE in the last 168 hours. No results for input(s): AMMONIA in the last 168 hours. CBC: Recent Labs  Lab 05/19/22 1730 05/21/22 0432  WBC 26.0* 17.6*  HGB 12.9* 13.5  HCT 41.4 42.5  MCV 92.0 90.2  PLT 305 320   Cardiac Enzymes: No results for input(s): CKTOTAL, CKMB, CKMBINDEX, TROPONINI in the last 168 hours. BNP: Invalid input(s): POCBNP CBG: Recent Labs  Lab 05/23/22 0804 05/23/22 1210 05/23/22 1613 05/23/22 2102 05/24/22 0757  GLUCAP 152* 124* 135* 138* 101*   D-Dimer No results for input(s): DDIMER in the last 72 hours. Hgb A1c No results for input(s): HGBA1C in the last 72 hours. Lipid Profile No results for input(s): CHOL, HDL, LDLCALC, TRIG, CHOLHDL, LDLDIRECT in the last 72 hours. Thyroid function studies No results for input(s): TSH, T4TOTAL, T3FREE, THYROIDAB in the last  72 hours.  Invalid input(s): FREET3 Anemia work up No results for input(s): VITAMINB12, FOLATE, FERRITIN, TIBC, IRON, RETICCTPCT in the last 72 hours. Urinalysis  Component Value Date/Time   COLORURINE YELLOW (A) 12/10/2017 2248   APPEARANCEUR CLEAR (A) 12/10/2017 2248   APPEARANCEUR Clear 02/04/2013 1430   LABSPEC 1.009 12/10/2017 2248   LABSPEC 1.004 02/04/2013 1430   PHURINE 6.0 12/10/2017 2248   GLUCOSEU NEGATIVE 12/10/2017 2248   GLUCOSEU Negative 02/04/2013 1430   HGBUR NEGATIVE 12/10/2017 2248   BILIRUBINUR NEGATIVE 12/10/2017 2248   BILIRUBINUR Negative 02/04/2013 1430   KETONESUR NEGATIVE 12/10/2017 2248   PROTEINUR NEGATIVE 12/10/2017 2248   NITRITE NEGATIVE 12/10/2017 2248   LEUKOCYTESUR NEGATIVE 12/10/2017 2248   LEUKOCYTESUR Negative 02/04/2013 1430   Sepsis Labs Invalid input(s): PROCALCITONIN,  WBC,  LACTICIDVEN Microbiology Recent Results (from the past 240 hour(s))  Resp Panel by RT-PCR (Flu A&B, Covid) Anterior Nasal Swab     Status: None   Collection Time: 05/16/22  9:54 AM   Specimen: Anterior Nasal Swab  Result Value Ref Range Status   SARS Coronavirus 2 by RT PCR NEGATIVE NEGATIVE Final    Comment: (NOTE) SARS-CoV-2 target nucleic acids are NOT DETECTED.  The SARS-CoV-2 RNA is generally detectable in upper respiratory specimens during the acute phase of infection. The lowest concentration of SARS-CoV-2 viral copies this assay can detect is 138 copies/mL. A negative result does not preclude SARS-Cov-2 infection and should not be used as the sole basis for treatment or other patient management decisions. A negative result may occur with  improper specimen collection/handling, submission of specimen other than nasopharyngeal swab, presence of viral mutation(s) within the areas targeted by this assay, and inadequate number of viral copies(<138 copies/mL). A negative result must be combined with clinical observations, patient history, and  epidemiological information. The expected result is Negative.  Fact Sheet for Patients:  EntrepreneurPulse.com.au  Fact Sheet for Healthcare Providers:  IncredibleEmployment.be  This test is no t yet approved or cleared by the [place] FDA and  has been authorized for detection and/or diagnosis of SARS-CoV-2 by FDA under an Emergency Use Authorization (EUA). This EUA will remain  in effect (meaning this test can be used) for the duration of the COVID-19 declaration under Section 564(b)(1) of the Act, 21 U.S.C.section 360bbb-3(b)(1), unless the authorization is terminated  or revoked sooner.       Influenza A by PCR NEGATIVE NEGATIVE Final   Influenza B by PCR NEGATIVE NEGATIVE Final    Comment: (NOTE) The Xpert Xpress SARS-CoV-2/FLU/RSV plus assay is intended as an aid in the diagnosis of influenza from Nasopharyngeal swab specimens and should not be used as a sole basis for treatment. Nasal washings and aspirates are unacceptable for Xpert Xpress SARS-CoV-2/FLU/RSV testing.  Fact Sheet for Patients: EntrepreneurPulse.com.au  Fact Sheet for Healthcare Providers: IncredibleEmployment.be  This test is not yet approved or cleared by the [place] FDA and has been authorized for detection and/or diagnosis of SARS-CoV-2 by FDA under an Emergency Use Authorization (EUA). This EUA will remain in effect (meaning this test can be used) for the duration of the COVID-19 declaration under Section 564(b)(1) of the Act, 21 U.S.C. section 360bbb-3(b)(1), unless the authorization is terminated or revoked.  Performed at [facility], [address] [place], [place] [address]   Blood culture (routine x 2)     Status: None   Collection Time: 05/19/22  6:33 PM   Specimen: BLOOD  Result Value Ref Range Status   Specimen Description BLOOD BLOOD RIGHT FOREARM  Final   Special Requests   Final     BOTTLES DRAWN AEROBIC AND ANAEROBIC Blood Culture adequate  volume   Culture   Final    NO GROWTH 5 DAYS Performed at [facility], [address] [place], [place] [address]    Report Status 05/24/2022 FINAL  Final  Blood culture (routine x 2)     Status: None   Collection Time: 05/19/22  6:38 PM   Specimen: BLOOD  Result Value Ref Range Status   Specimen Description BLOOD RIGHT ANTECUBITAL  Final   Special Requests   Final    BOTTLES DRAWN AEROBIC AND ANAEROBIC Blood Culture adequate volume   Culture   Final    NO GROWTH 5 DAYS Performed at [facility], [address], [place], [place] [address]    Report Status 05/24/2022 FINAL  Final  MRSA Next Gen by PCR, Nasal     Status: None   Collection Time: 05/19/22  6:52 PM   Specimen: Nasal Mucosa; Nasal Swab  Result Value Ref Range Status   MRSA by PCR Next Gen NOT DETECTED NOT DETECTED Final    Comment: (NOTE) The GeneXpert MRSA Assay (FDA approved for NASAL specimens only), is one component of a comprehensive MRSA colonization surveillance program. It is not intended to diagnose MRSA infection nor to guide or monitor treatment for MRSA infections. Test performance is not FDA approved in patients less than 82 years old. Performed at [facility], [place], [place] [address]   Respiratory (~20 pathogens) panel by PCR     Status: None   Collection Time: 05/19/22  6:59 PM   Specimen: Nasopharyngeal Swab; Respiratory  Result Value Ref Range Status   Adenovirus NOT DETECTED NOT DETECTED Final   Coronavirus 229E NOT DETECTED NOT DETECTED Final    Comment: (NOTE) The Coronavirus on the Respiratory Panel, DOES NOT test for the novel  Coronavirus (2019 nCoV)    Coronavirus HKU1 NOT DETECTED NOT DETECTED Final   Coronavirus NL63 NOT DETECTED NOT DETECTED Final   Coronavirus OC43 NOT DETECTED NOT DETECTED Final   Metapneumovirus NOT DETECTED NOT DETECTED Final   Rhinovirus / Enterovirus  NOT DETECTED NOT DETECTED Final   Influenza A NOT DETECTED NOT DETECTED Final   Influenza B NOT DETECTED NOT DETECTED Final   Parainfluenza Virus 1 NOT DETECTED NOT DETECTED Final   Parainfluenza Virus 2 NOT DETECTED NOT DETECTED Final   Parainfluenza Virus 3 NOT DETECTED NOT DETECTED Final   Parainfluenza Virus 4 NOT DETECTED NOT DETECTED Final   Respiratory Syncytial Virus NOT DETECTED NOT DETECTED Final   Bordetella pertussis NOT DETECTED NOT DETECTED Final   Bordetella Parapertussis NOT DETECTED NOT DETECTED Final   Chlamydophila pneumoniae NOT DETECTED NOT DETECTED Final   Mycoplasma pneumoniae NOT DETECTED NOT DETECTED Final    Comment: Performed at [facility], [place]. [address], [place], [place] [address]  C Difficile Quick Screen w PCR reflex     Status: None   Collection Time: 05/20/22  7:00 AM   Specimen: STOOL  Result Value Ref Range Status   C Diff antigen NEGATIVE NEGATIVE Final   C Diff toxin NEGATIVE NEGATIVE Final   C Diff interpretation No C. difficile detected.  Final    Comment: Performed at [facility], [address] [place], [place] [address]  Gastrointestinal Panel by PCR , Stool     Status: None   Collection Time: 05/20/22  7:00 AM   Specimen: Stool  Result Value Ref Range Status   Campylobacter species NOT DETECTED NOT DETECTED Final   Plesimonas shigelloides NOT  DETECTED NOT DETECTED Final   Salmonella species NOT DETECTED NOT DETECTED Final   Yersinia enterocolitica NOT DETECTED NOT DETECTED Final   Vibrio species NOT DETECTED NOT DETECTED Final   Vibrio cholerae NOT DETECTED NOT DETECTED Final   Enteroaggregative E coli (EAEC) NOT DETECTED NOT DETECTED Final   Enteropathogenic E coli (EPEC) NOT DETECTED NOT DETECTED Final   Enterotoxigenic E coli (ETEC) NOT DETECTED NOT DETECTED Final   Shiga like toxin producing E coli (STEC) NOT DETECTED NOT DETECTED Final   Shigella/Enteroinvasive E coli (EIEC) NOT DETECTED NOT DETECTED Final    Cryptosporidium NOT DETECTED NOT DETECTED Final   Cyclospora cayetanensis NOT DETECTED NOT DETECTED Final   Entamoeba histolytica NOT DETECTED NOT DETECTED Final   Giardia lamblia NOT DETECTED NOT DETECTED Final   Adenovirus F40/41 NOT DETECTED NOT DETECTED Final   Astrovirus NOT DETECTED NOT DETECTED Final   Norovirus GI/GII NOT DETECTED NOT DETECTED Final   Rotavirus A NOT DETECTED NOT DETECTED Final   Sapovirus (I, II, IV, and V) NOT DETECTED NOT DETECTED Final    Comment: Performed at [facility], [address] [place], [place] [address]     Total time spend on discharging this patient, including the last patient exam, discussing the hospital stay, instructions for ongoing care as it relates to all pertinent caregivers, as well as preparing the medical discharge records, prescriptions, and/or referrals as applicable, is 45 minutes.    Enzo Bi, MD  [facility] 05/24/2022, 8:55 AM

## 2022-06-19 ENCOUNTER — Encounter: Payer: No Typology Code available for payment source | Attending: Anesthesiology | Admitting: *Deleted

## 2022-06-19 VITALS — Ht 65.6 in | Wt 184.3 lb

## 2022-06-19 DIAGNOSIS — J449 Chronic obstructive pulmonary disease, unspecified: Secondary | ICD-10-CM | POA: Diagnosis present

## 2022-06-19 NOTE — Patient Instructions (Signed)
Patient Instructions  Patient Details  Name: Darius Norris MRN: [id number] Date of Birth: 09-17-1951 Referring Provider:  Lawrence Marseilles, MD  Below are your personal goals for exercise, nutrition, and risk factors. Our goal is to help you stay on track towards obtaining and maintaining these goals. We will be discussing your progress on these goals with you throughout the program.  Initial Exercise Prescription:  Initial Exercise Prescription - 06/19/22 1400       Date of Initial Exercise RX and Referring Provider   Date 06/19/22    Referring Provider Delorise Jackson MD   Dr. Jenne Pane ([facility])     Oxygen   Oxygen Continuous    Liters 3    Maintain Oxygen Saturation 88% or higher      Recumbant Bike   Level 1    RPM 50    Watts 10    Minutes 15    METs 1.5      NuStep   Level 1    SPM 80    Minutes 15    METs 1.5      REL-XR   Level 1    Speed 50    Minutes 15    METs 1.5      T5 Nustep   Level 1    SPM 80    Minutes 15    METs 1.5      Biostep-RELP   Level 1    SPM 50    Minutes 15    METs 2      Track   Laps 5    Minutes 15    METs 1.27      Prescription Details   Frequency (times per week) 2    Duration Progress to 30 minutes of continuous aerobic without signs/symptoms of physical distress      Intensity   THRR 40-80% of Max Heartrate 97-132    Ratings of Perceived Exertion 11-13    Perceived Dyspnea 0-4      Progression   Progression Continue to progress workloads to maintain intensity without signs/symptoms of physical distress.      Resistance Training   Training Prescription Yes    Weight 3 kb    Reps 10-15             Exercise Goals: Frequency: Be able to perform aerobic exercise two to three times per week in program working toward 2-5 days per week of home exercise.  Intensity: Work with a perceived exertion of 11 (fairly light) - 15 (hard) while following your exercise prescription.  We will make changes to  your prescription with you as you progress through the program.   Duration: Be able to do 30 to 45 minutes of continuous aerobic exercise in addition to a 5 minute warm-up and a 5 minute cool-down routine.   Nutrition Goals: Your personal nutrition goals will be established when you do your nutrition analysis with the dietician.  The following are general nutrition guidelines to follow: Cholesterol < '200mg'$ /day Sodium < '1500mg'$ /day Fiber: Men over 50 yrs - 30 grams per day  Personal Goals:  Personal Goals and Risk Factors at Admission - 06/19/22 1452       Core Components/Risk Factors/Patient Goals on Admission    Weight Management Yes;Weight Loss;Obesity    Intervention Weight Management: Develop a combined nutrition and exercise program designed to reach desired caloric intake, while maintaining appropriate intake of nutrient and fiber, sodium and fats, and appropriate energy expenditure required for  the weight goal.;Weight Management: Provide education and appropriate resources to help participant work on and attain dietary goals.;Weight Management/Obesity: Establish reasonable short term and long term weight goals.;Obesity: Provide education and appropriate resources to help participant work on and attain dietary goals.    Admit Weight 184 lb 4.8 oz (83.6 kg)    Goal Weight: Short Term 180 lb (81.6 kg)    Goal Weight: Long Term 180 lb (81.6 kg)    Expected Outcomes Short Term: Continue to assess and modify interventions until short term weight is achieved;Long Term: Adherence to nutrition and physical activity/exercise program aimed toward attainment of established weight goal;Weight Loss: Understanding of general recommendations for a balanced deficit meal plan, which promotes 1-2 lb weight loss per week and includes a negative energy balance of [phone] kcal/d;Understanding of distribution of calorie intake throughout the day with the consumption of 4-5 meals/snacks;Understanding  recommendations for meals to include 15-35% energy as protein, 25-35% energy from fat, 35-60% energy from carbohydrates, less than '200mg'$  of dietary cholesterol, 20-35 gm of total fiber daily    Intervention Provide education, individualized exercise plan and daily activity instruction to help decrease symptoms of SOB with activities of daily living.    Expected Outcomes Short Term: Improve cardiorespiratory fitness to achieve a reduction of symptoms when performing ADLs;Long Term: Be able to perform more ADLs without symptoms or delay the onset of symptoms    Increase knowledge of respiratory medications and ability to use respiratory devices properly  Yes    Intervention Provide education and demonstration as needed of appropriate use of medications, inhalers, and oxygen therapy.    Expected Outcomes Short Term: Achieves understanding of medications use. Understands that oxygen is a medication prescribed by physician. Demonstrates appropriate use of inhaler and oxygen therapy.;Long Term: Maintain appropriate use of medications, inhalers, and oxygen therapy.    Diabetes Yes    Intervention Provide education about signs/symptoms and action to take for hypo/hyperglycemia.;Provide education about proper nutrition, including hydration, and aerobic/resistive exercise prescription along with prescribed medications to achieve blood glucose in normal ranges: Fasting glucose 65-99 mg/dL    Expected Outcomes Short Term: Participant verbalizes understanding of the signs/symptoms and immediate care of hyper/hypoglycemia, proper foot care and importance of medication, aerobic/resistive exercise and nutrition plan for blood glucose control.;Long Term: Attainment of HbA1C < 7%.    Hypertension Yes    Intervention Provide education on lifestyle modifcations including regular physical activity/exercise, weight management, moderate sodium restriction and increased consumption of fresh fruit, vegetables, and low fat dairy,  alcohol moderation, and smoking cessation.;Monitor prescription use compliance.    Expected Outcomes Short Term: Continued assessment and intervention until BP is < 140/60m HG in hypertensive participants. < 130/851mHG in hypertensive participants with diabetes, heart failure or chronic kidney disease.;Long Term: Maintenance of blood pressure at goal levels.    Lipids Yes    Intervention Provide education and support for participant on nutrition & aerobic/resistive exercise along with prescribed medications to achieve LDL '70mg'$ , HDL >'40mg'$ .    Expected Outcomes Short Term: Participant states understanding of desired cholesterol values and is compliant with medications prescribed. Participant is following exercise prescription and nutrition guidelines.;Long Term: Cholesterol controlled with medications as prescribed, with individualized exercise RX and with personalized nutrition plan. Value goals: LDL < '70mg'$ , HDL > 40 mg.             Tobacco Use Initial Evaluation: Social History   Tobacco Use  Smoking Status Former   Packs/day: 1.00   Years: 30.00  Total pack years: 30.00   Types: Cigarettes   Quit date: 12/18/2001   Years since quitting: 20.5  Smokeless Tobacco Never    Exercise Goals and Review:  Exercise Goals     Row Name 06/19/22 1449             Exercise Goals   Increase Physical Activity Yes       Intervention Provide advice, education, support and counseling about physical activity/exercise needs.;Develop an individualized exercise prescription for aerobic and resistive training based on initial evaluation findings, risk stratification, comorbidities and participant's personal goals.       Expected Outcomes Short Term: Attend rehab on a regular basis to increase amount of physical activity.;Long Term: Add in home exercise to make exercise part of routine and to increase amount of physical activity.;Long Term: Exercising regularly at least 3-5 days a week.        Increase Strength and Stamina Yes       Intervention Provide advice, education, support and counseling about physical activity/exercise needs.;Develop an individualized exercise prescription for aerobic and resistive training based on initial evaluation findings, risk stratification, comorbidities and participant's personal goals.       Expected Outcomes Short Term: Increase workloads from initial exercise prescription for resistance, speed, and METs.;Short Term: Perform resistance training exercises routinely during rehab and add in resistance training at home;Long Term: Improve cardiorespiratory fitness, muscular endurance and strength as measured by increased METs and functional capacity (6MWT)       Able to understand and use rate of perceived exertion (RPE) scale Yes       Intervention Provide education and explanation on how to use RPE scale       Expected Outcomes Short Term: Able to use RPE daily in rehab to express subjective intensity level;Long Term:  Able to use RPE to guide intensity level when exercising independently       Able to understand and use Dyspnea scale Yes       Intervention Provide education and explanation on how to use Dyspnea scale       Expected Outcomes Short Term: Able to use Dyspnea scale daily in rehab to express subjective sense of shortness of breath during exertion;Long Term: Able to use Dyspnea scale to guide intensity level when exercising independently       Knowledge and understanding of Target Heart Rate Range (THRR) Yes       Intervention Provide education and explanation of THRR including how the numbers were predicted and where they are located for reference       Expected Outcomes Short Term: Able to state/look up THRR;Short Term: Able to use daily as guideline for intensity in rehab;Long Term: Able to use THRR to govern intensity when exercising independently       Able to check pulse independently Yes       Intervention Provide education and demonstration  on how to check pulse in carotid and radial arteries.;Review the importance of being able to check your own pulse for safety during independent exercise       Expected Outcomes Short Term: Able to explain why pulse checking is important during independent exercise;Long Term: Able to check pulse independently and accurately       Understanding of Exercise Prescription Yes       Intervention Provide education, explanation, and written materials on patient's individual exercise prescription       Expected Outcomes Short Term: Able to explain program exercise prescription;Long Term: Able to explain home  exercise prescription to exercise independently                Copy of goals given to participant.

## 2022-06-19 NOTE — Progress Notes (Signed)
Pulmonary Individual Treatment Plan  Patient Details  Name: Darius Norris MRN: [id number] Date of Birth: Jul 18, 1951 Referring Provider:   Flowsheet Row Pulmonary Rehab from 06/19/2022 in [facility] Cardiac and Pulmonary Rehab  Referring Provider Delorise Jackson MD  [Dr. Jenne Pane ([facility])]       Initial Encounter Date:  Flowsheet Row Pulmonary Rehab from 06/19/2022 in [facility] Cardiac and Pulmonary Rehab  Date 06/19/22       Visit Diagnosis: Chronic obstructive pulmonary disease, unspecified COPD type ([place])  Patient's Home Medications on Admission:  Current Outpatient Medications:    acetaminophen (TYLENOL) 325 MG tablet, Take 650 mg by mouth every 6 (six) hours as needed., Disp: , Rfl:    albuterol (VENTOLIN HFA) 108 (90 Base) MCG/ACT inhaler, Inhale into the lungs., Disp: , Rfl:    aspirin EC 81 MG EC tablet, Take 1 tablet (81 mg total) by mouth daily., Disp: 180 tablet, Rfl: 0   atorvastatin (LIPITOR) 80 MG tablet, Take 1 tablet (80 mg total) by mouth at bedtime., Disp: 30 tablet, Rfl: 0   budesonide-formoterol (SYMBICORT) 160-4.5 MCG/ACT inhaler, Inhale 2 puffs into the lungs 2 (two) times daily., Disp: , Rfl:    busPIRone (BUSPAR) 10 MG tablet, Take by mouth., Disp: , Rfl:    clobetasol (TEMOVATE) 0.05 % external solution, Apply topically., Disp: , Rfl:    dabigatran (PRADAXA) 150 MG CAPS capsule, Take 150 mg by mouth 2 (two) times daily., Disp: , Rfl:    diltiazem (CARDIZEM CD) 300 MG 24 hr capsule, Take 1 capsule (300 mg total) by mouth daily., Disp: 30 capsule, Rfl: 2   folic acid (FOLVITE) 1 MG tablet, Take 1 tablet (1 mg total) by mouth daily., Disp: 30 tablet, Rfl: 2   furosemide (LASIX) 40 MG tablet, Take 1 tablet (40 mg total) by mouth daily., Disp: 30 tablet, Rfl: 2   gabapentin (NEURONTIN) 300 MG capsule, Take 1 capsule by mouth 3 (three) times daily as needed., Disp: , Rfl:    guaifenesin (HUMIBID E) 400 MG TABS tablet, Take by mouth., Disp: , Rfl:    hydrocortisone  2.5 % cream, Apply topically., Disp: , Rfl:    hydrOXYzine (ATARAX/VISTARIL) 25 MG tablet, Take by mouth., Disp: , Rfl:    Ipratropium-Albuterol (COMBIVENT) 20-100 MCG/ACT AERS respimat, Inhale 1 puff into the lungs every 6 (six) hours as needed for wheezing or shortness of breath., Disp: , Rfl:    isosorbide mononitrate (IMDUR) 60 MG 24 hr tablet, Take 60 mg by mouth daily., Disp: , Rfl:    ketorolac (ACULAR) 0.5 % ophthalmic solution, INSTILL 1 DROP OPERATIVE EYE FOUR TIMES A DAY FOR POSTOPERATIVE PAIN/INFLAMMATION SEND TO 4B AMB SURGERY OR ON DAY OF SURGERY 04/05/22, Disp: , Rfl:    lidocaine (XYLOCAINE) 5 % ointment, Apply topically., Disp: , Rfl:    loratadine (CLARITIN) 10 MG tablet, Take 10 mg by mouth daily as needed for allergies. , Disp: , Rfl:    Melatonin 3 MG CAPS, Take 2 capsules by mouth at bedtime., Disp: , Rfl:    metFORMIN (GLUCOPHAGE-XR) 500 MG 24 hr tablet, Take 500 mg by mouth in the morning and at bedtime., Disp: , Rfl:    methotrexate (RHEUMATREX) 2.5 MG tablet, Take 10 mg by mouth once a week., Disp: , Rfl:    metoprolol tartrate (LOPRESSOR) 100 MG tablet, Take 1 tablet (100 mg total) by mouth 2 (two) times daily., Disp: 60 tablet, Rfl: 2   mometasone (ASMANEX) 220 MCG/INH inhaler, Take by mouth., Disp: ,  Rfl:    moxifloxacin (VIGAMOX) 0.5 % ophthalmic solution, INSTILL 1 DROP IN LEFT EYE FOUR TIMES A DAY FOR CATARACT SURGERY SEND TO 4B AMB SURGERY OR ON DAY OF SURGERY 04/05/22, Disp: , Rfl:    senna (SENOKOT) 8.6 MG TABS tablet, Take 1 tablet by mouth daily as needed for mild constipation., Disp: , Rfl:    simethicone (MYLICON) 80 MG chewable tablet, CHEW ONE TABLET BY MOUTH THREE TIMES A DAY AFTER MEALS AS NEEDED FOR GAS, Disp: , Rfl:   Past Medical History: Past Medical History:  Diagnosis Date   A-fib ([place])    CAD (coronary artery disease)    Colitis    COPD (chronic obstructive pulmonary disease) (HCC)    Emphysema lung (HCC)    GERD (gastroesophageal reflux  disease)    Hypertension    MI (myocardial infarction) ([place]) 1999    Tobacco Use: Social History   Tobacco Use  Smoking Status Former   Packs/day: 1.00   Years: 30.00   Total pack years: 30.00   Types: Cigarettes   Quit date: 12/18/2001   Years since quitting: 20.5  Smokeless Tobacco Never    Labs: Review Flowsheet  More data may exist      Latest Ref Rng & Units 02/04/2013 09/04/2016 05/02/2018 01/05/2019  Labs for ITP Cardiac and Pulmonary Rehab  Cholestrol 0 - 200 mg/dL 185  116  - -  LDL (calc) 0 - 99 mg/dL 114  51  - -  HDL-C >40 mg/dL 38  42  - -  Trlycerides <150 mg/dL 166  116  - -  Hemoglobin A1c 4.8 - 5.6 % - 6.4  - -  Bicarbonate 20.0 - 28.0 mmol/L - - 36.3  29.6   O2 Saturation % - - - 81.7       12/06/2021  Labs for ITP Cardiac and Pulmonary Rehab  Cholestrol 106   LDL (calc) 51   HDL-C 33   Trlycerides 108   Hemoglobin A1c 5.9   Bicarbonate -  O2 Saturation -     Pulmonary Assessment Scores:  Pulmonary Assessment Scores     Row Name 06/19/22 1455         ADL UCSD   ADL Phase Entry     SOB Score total 49     Rest 0     Walk 2     Stairs 4     Bath 3     Dress 1     Shop 2       CAT Score   CAT Score 17       mMRC Score   mMRC Score 2              UCSD: Self-administered rating of dyspnea associated with activities of daily living (ADLs) 6-point scale (0 = "not at all" to 5 = "maximal or unable to do because of breathlessness")  Scoring Scores range from 0 to 120.  Minimally important difference is 5 units  CAT: CAT can identify the health impairment of COPD patients and is better correlated with disease progression.  CAT has a scoring range of zero to 40. The CAT score is classified into four groups of low (less than 10), medium (10 - 20), high (21-30) and very high (31-40) based on the impact level of disease on health status. A CAT score over 10 suggests significant symptoms.  A worsening CAT score could be explained by an  exacerbation, poor medication adherence, poor inhaler technique, or  progression of COPD or comorbid conditions.  CAT MCID is 2 points  mMRC: mMRC (Modified Medical Research Council) Dyspnea Scale is used to assess the degree of baseline functional disability in patients of respiratory disease due to dyspnea. No minimal important difference is established. A decrease in score of 1 point or greater is considered a positive change.   Pulmonary Function Assessment:  Pulmonary Function Assessment - 06/19/22 1455       Breath   Shortness of Breath Yes;Fear of Shortness of Breath;Limiting activity             Exercise Target Goals: Exercise Program Goal: Individual exercise prescription set using results from initial 6 min walk test and THRR while considering  patient's activity barriers and safety.   Exercise Prescription Goal: Initial exercise prescription builds to 30-45 minutes a day of aerobic activity, 2-3 days per week.  Home exercise guidelines will be given to patient during program as part of exercise prescription that the participant will acknowledge.  Education: Aerobic Exercise: - Group verbal and visual presentation on the components of exercise prescription. Introduces F.I.T.T principle from ACSM for exercise prescriptions.  Reviews F.I.T.T. principles of aerobic exercise including progression. Written material given at graduation. Flowsheet Row Pulmonary Rehab from 11/03/2021 in [facility] Cardiac and Pulmonary Rehab  Date 10/13/21  Educator [facility]  Instruction Review Code 1- Verbalizes Understanding       Education: Resistance Exercise: - Group verbal and visual presentation on the components of exercise prescription. Introduces F.I.T.T principle from ACSM for exercise prescriptions  Reviews F.I.T.T. principles of resistance exercise including progression. Written material given at graduation.    Education: Exercise & Equipment Safety: - Individual verbal instruction and  demonstration of equipment use and safety with use of the equipment. Flowsheet Row Pulmonary Rehab from 06/19/2022 in [facility] Cardiac and Pulmonary Rehab  Date 05/08/22  Educator [facility]  Instruction Review Code 1- Verbalizes Understanding       Education: Exercise Physiology & General Exercise Guidelines: - Group verbal and written instruction with models to review the exercise physiology of the cardiovascular system and associated critical values. Provides general exercise guidelines with specific guidelines to those with heart or lung disease.  Flowsheet Row Pulmonary Rehab from 11/03/2021 in [facility] Cardiac and Pulmonary Rehab  Date 08/04/21  Educator AS  Instruction Review Code 1- Verbalizes Understanding       Education: Flexibility, Balance, Mind/Body Relaxation: - Group verbal and visual presentation with interactive activity on the components of exercise prescription. Introduces F.I.T.T principle from ACSM for exercise prescriptions. Reviews F.I.T.T. principles of flexibility and balance exercise training including progression. Also discusses the mind body connection.  Reviews various relaxation techniques to help reduce and manage stress (i.e. Deep breathing, progressive muscle relaxation, and visualization). Balance handout provided to take home. Written material given at graduation. Flowsheet Row Pulmonary Rehab from 11/03/2021 in [facility] Cardiac and Pulmonary Rehab  Date 10/27/21  Educator AS  Instruction Review Code 1- Verbalizes Understanding       Activity Barriers & Risk Stratification:  Activity Barriers & Cardiac Risk Stratification - 06/19/22 1445       Activity Barriers & Cardiac Risk Stratification   Activity Barriers Shortness of Breath;Deconditioning;Back Problems;Muscular Weakness;Balance Concerns;Assistive Device;Other (comment);Joint Problems    Comments neuropathy in feet, back and hip pain             6 Minute Walk:  6 Minute Walk     Row Name 06/19/22 1443  6 Minute Walk   Phase Initial     Distance 355 feet     Walk Time 3.03 minutes     # of Rest Breaks 2  25 sec, 17 sec, stopped at 3:44     MPH 1.33     METS 0.96     RPE 15     Perceived Dyspnea  2     VO2 Peak 3.38     Symptoms Yes (comment)     Comments hip pain 6/10, back pain 6/10, SOB, fatigue     Resting HR 62 bpm     Resting BP 146/64     Resting Oxygen Saturation  93 %     Exercise Oxygen Saturation  during 6 min walk 80 %     Max Ex. HR 74 bpm     Max Ex. BP 156/64     2 Minute Post BP 126/64       Interval HR   1 Minute HR 69     2 Minute HR 73     3 Minute HR 74     4 Minute HR 74  stopped at 3:44     2 Minute Post HR 60     Interval Heart Rate? Yes       Interval Oxygen   Interval Oxygen? Yes     Baseline Oxygen Saturation % 93 %  2L pulsed     1 Minute Oxygen Saturation % 89 %     1 Minute Liters of Oxygen 2 L  pulsed     2 Minute Oxygen Saturation % 83 %     2 Minute Liters of Oxygen 2 L     3 Minute Oxygen Saturation % 81 %     3 Minute Liters of Oxygen 3 L     4 Minute Oxygen Saturation % 80 %     4 Minute Liters of Oxygen 3 L     2 Minute Post Oxygen Saturation % 92 %     2 Minute Post Liters of Oxygen 3 L             Oxygen Initial Assessment:  Oxygen Initial Assessment - 06/19/22 1453       Home Oxygen   Home Oxygen Device Home Concentrator;E-Tanks    Sleep Oxygen Prescription Continuous    Liters per minute 3    Home Exercise Oxygen Prescription Pulsed    Liters per minute 3    Home Resting Oxygen Prescription Pulsed    Liters per minute 3    Compliance with Home Oxygen Use Yes      Initial 6 min Walk   Oxygen Used Pulsed;E-Tanks    Liters per minute 3      Program Oxygen Prescription   Program Oxygen Prescription Continuous;E-Tanks    Liters per minute 3      Intervention   Short Term Goals To learn and exhibit compliance with exercise, home and travel O2 prescription;To learn and understand importance of  monitoring SPO2 with pulse oximeter and demonstrate accurate use of the pulse oximeter.;To learn and understand importance of maintaining oxygen saturations>88%;To learn and demonstrate proper pursed lip breathing techniques or other breathing techniques. ;To learn and demonstrate proper use of respiratory medications    Long  Term Goals Exhibits compliance with exercise, home  and travel O2 prescription;Verbalizes importance of monitoring SPO2 with pulse oximeter and return demonstration;Maintenance of O2 saturations>88%;Exhibits proper breathing techniques, such as pursed lip breathing or other method  taught during program session;Compliance with respiratory medication;Demonstrates proper use of MDI's             Oxygen Re-Evaluation:   Oxygen Discharge (Final Oxygen Re-Evaluation):   Initial Exercise Prescription:  Initial Exercise Prescription - 06/19/22 1400       Date of Initial Exercise RX and Referring Provider   Date 06/19/22    Referring Provider Delorise Jackson MD   Dr. Jenne Pane ([facility])     Oxygen   Oxygen Continuous    Liters 3    Maintain Oxygen Saturation 88% or higher      Recumbant Bike   Level 1    RPM 50    Watts 10    Minutes 15    METs 1.5      NuStep   Level 1    SPM 80    Minutes 15    METs 1.5      REL-XR   Level 1    Speed 50    Minutes 15    METs 1.5      T5 Nustep   Level 1    SPM 80    Minutes 15    METs 1.5      Biostep-RELP   Level 1    SPM 50    Minutes 15    METs 2      Track   Laps 5    Minutes 15    METs 1.27      Prescription Details   Frequency (times per week) 2    Duration Progress to 30 minutes of continuous aerobic without signs/symptoms of physical distress      Intensity   THRR 40-80% of Max Heartrate 97-132    Ratings of Perceived Exertion 11-13    Perceived Dyspnea 0-4      Progression   Progression Continue to progress workloads to maintain intensity without signs/symptoms of physical  distress.      Resistance Training   Training Prescription Yes    Weight 3 kb    Reps 10-15             Perform Capillary Blood Glucose checks as needed.  Exercise Prescription Changes:   Exercise Prescription Changes     Row Name 06/19/22 1400             Response to Exercise   Blood Pressure (Admit) 146/64       Blood Pressure (Exercise) 156/64       Blood Pressure (Exit) 128/60       Heart Rate (Admit) 62 bpm       Heart Rate (Exercise) 74 bpm       Heart Rate (Exit) 60 bpm       Oxygen Saturation (Admit) 93 %       Oxygen Saturation (Exercise) 80 %       Oxygen Saturation (Exit) 96 %       Rating of Perceived Exertion (Exercise) 15       Perceived Dyspnea (Exercise) 2       Symptoms hip and back pain 6/10, SOB       Comments walk test results                Exercise Comments:   Exercise Goals and Review:   Exercise Goals     Row Name 06/19/22 1449             Exercise Goals   Increase Physical Activity Yes  Intervention Provide advice, education, support and counseling about physical activity/exercise needs.;Develop an individualized exercise prescription for aerobic and resistive training based on initial evaluation findings, risk stratification, comorbidities and participant's personal goals.       Expected Outcomes Short Term: Attend rehab on a regular basis to increase amount of physical activity.;Long Term: Add in home exercise to make exercise part of routine and to increase amount of physical activity.;Long Term: Exercising regularly at least 3-5 days a week.       Increase Strength and Stamina Yes       Intervention Provide advice, education, support and counseling about physical activity/exercise needs.;Develop an individualized exercise prescription for aerobic and resistive training based on initial evaluation findings, risk stratification, comorbidities and participant's personal goals.       Expected Outcomes Short Term: Increase  workloads from initial exercise prescription for resistance, speed, and METs.;Short Term: Perform resistance training exercises routinely during rehab and add in resistance training at home;Long Term: Improve cardiorespiratory fitness, muscular endurance and strength as measured by increased METs and functional capacity (6MWT)       Able to understand and use rate of perceived exertion (RPE) scale Yes       Intervention Provide education and explanation on how to use RPE scale       Expected Outcomes Short Term: Able to use RPE daily in rehab to express subjective intensity level;Long Term:  Able to use RPE to guide intensity level when exercising independently       Able to understand and use Dyspnea scale Yes       Intervention Provide education and explanation on how to use Dyspnea scale       Expected Outcomes Short Term: Able to use Dyspnea scale daily in rehab to express subjective sense of shortness of breath during exertion;Long Term: Able to use Dyspnea scale to guide intensity level when exercising independently       Knowledge and understanding of Target Heart Rate Range (THRR) Yes       Intervention Provide education and explanation of THRR including how the numbers were predicted and where they are located for reference       Expected Outcomes Short Term: Able to state/look up THRR;Short Term: Able to use daily as guideline for intensity in rehab;Long Term: Able to use THRR to govern intensity when exercising independently       Able to check pulse independently Yes       Intervention Provide education and demonstration on how to check pulse in carotid and radial arteries.;Review the importance of being able to check your own pulse for safety during independent exercise       Expected Outcomes Short Term: Able to explain why pulse checking is important during independent exercise;Long Term: Able to check pulse independently and accurately       Understanding of Exercise Prescription Yes        Intervention Provide education, explanation, and written materials on patient's individual exercise prescription       Expected Outcomes Short Term: Able to explain program exercise prescription;Long Term: Able to explain home exercise prescription to exercise independently                Exercise Goals Re-Evaluation :   Discharge Exercise Prescription (Final Exercise Prescription Changes):  Exercise Prescription Changes - 06/19/22 1400       Response to Exercise   Blood Pressure (Admit) 146/64    Blood Pressure (Exercise) 156/64    Blood Pressure (  Exit) 128/60    Heart Rate (Admit) 62 bpm    Heart Rate (Exercise) 74 bpm    Heart Rate (Exit) 60 bpm    Oxygen Saturation (Admit) 93 %    Oxygen Saturation (Exercise) 80 %    Oxygen Saturation (Exit) 96 %    Rating of Perceived Exertion (Exercise) 15    Perceived Dyspnea (Exercise) 2    Symptoms hip and back pain 6/10, SOB    Comments walk test results             Nutrition:  Target Goals: Understanding of nutrition guidelines, daily intake of sodium '1500mg'$ , cholesterol '200mg'$ , calories 30% from fat and 7% or less from saturated fats, daily to have 5 or more servings of fruits and vegetables.  Education: All About Nutrition: -Group instruction provided by verbal, written material, interactive activities, discussions, models, and posters to present general guidelines for heart healthy nutrition including fat, fiber, MyPlate, the role of sodium in heart healthy nutrition, utilization of the nutrition label, and utilization of this knowledge for meal planning. Follow up email sent as well. Written material given at graduation. Flowsheet Row Pulmonary Rehab from 06/19/2022 in [facility] Cardiac and Pulmonary Rehab  Education need identified 06/19/22       Biometrics:  Pre Biometrics - 06/19/22 1451       Pre Biometrics   Height 5' 5.6" (1.666 m)    Weight 184 lb 4.8 oz (83.6 kg)    BMI (Calculated) 30.12    Single Leg Stand  0 seconds              Nutrition Therapy Plan and Nutrition Goals:  Nutrition Therapy & Goals - 06/19/22 1451       Intervention Plan   Intervention Prescribe, educate and counsel regarding individualized specific dietary modifications aiming towards targeted core components such as weight, hypertension, lipid management, diabetes, heart failure and other comorbidities.    Expected Outcomes Short Term Goal: Understand basic principles of dietary content, such as calories, fat, sodium, cholesterol and nutrients.;Short Term Goal: A plan has been developed with personal nutrition goals set during dietitian appointment.;Long Term Goal: Adherence to prescribed nutrition plan.             Nutrition Assessments:  MEDIFICTS Score Key: ?70 Need to make dietary changes  40-70 Heart Healthy Diet ? 40 Therapeutic Level Cholesterol Diet  Flowsheet Row Pulmonary Rehab from 06/19/2022 in [facility] Cardiac and Pulmonary Rehab  Picture Your Plate Total Score on Admission 52      Picture Your Plate Scores: <09 Unhealthy dietary pattern with much room for improvement. 41-50 Dietary pattern unlikely to meet recommendations for good health and room for improvement. 51-60 More healthful dietary pattern, with some room for improvement.  >60 Healthy dietary pattern, although there may be some specific behaviors that could be improved.   Nutrition Goals Re-Evaluation:   Nutrition Goals Discharge (Final Nutrition Goals Re-Evaluation):   Psychosocial: Target Goals: Acknowledge presence or absence of significant depression and/or stress, maximize coping skills, provide positive support system. Participant is able to verbalize types and ability to use techniques and skills needed for reducing stress and depression.   Education: Stress, Anxiety, and Depression - Group verbal and visual presentation to define topics covered.  Reviews how body is impacted by stress, anxiety, and depression.  Also  discusses healthy ways to reduce stress and to treat/manage anxiety and depression.  Written material given at graduation. Flowsheet Row Pulmonary Rehab from 11/03/2021 in [facility] Cardiac and  Pulmonary Rehab  Date 07/28/21  Educator [facility]  Instruction Review Code 1- Verbalizes Understanding       Education: Sleep Hygiene -Provides group verbal and written instruction about how sleep can affect your health.  Define sleep hygiene, discuss sleep cycles and impact of sleep habits. Review good sleep hygiene tips.    Initial Review & Psychosocial Screening:  Initial Psych Review & Screening - 05/08/22 1044       Initial Review   Current issues with Current Sleep Concerns;Current Psychotropic Meds;Current Depression;Current Anxiety/Panic      Family Dynamics   Good Support System? Yes    Comments [name] states that his health makes his life stressful. He has been having trouble sleeping. He takes medication for his depression and anxiety.He can look to his wife and his two sisters for support.      Barriers   Psychosocial barriers to participate in program There are no identifiable barriers or psychosocial needs.;The patient should benefit from training in stress management and relaxation.      Screening Interventions   Interventions Encouraged to exercise;To provide support and resources with identified psychosocial needs;Provide feedback about the scores to participant    Expected Outcomes Short Term goal: Utilizing psychosocial counselor, staff and physician to assist with identification of specific Stressors or current issues interfering with healing process. Setting desired goal for each stressor or current issue identified.;Long Term Goal: Stressors or current issues are controlled or eliminated.;Short Term goal: Identification and review with participant of any Quality of Life or Depression concerns found by scoring the questionnaire.;Long Term goal: The participant improves quality of Life and  PHQ9 Scores as seen by post scores and/or verbalization of changes             Quality of Life Scores:  Scores of 19 and below usually indicate a poorer quality of life in these areas.  A difference of  2-3 points is a clinically meaningful difference.  A difference of 2-3 points in the total score of the Quality of Life Index has been associated with significant improvement in overall quality of life, self-image, physical symptoms, and general health in studies assessing change in quality of life.  PHQ-9: Review Flowsheet  More data exists      06/19/2022 11/17/2021 06/13/2021 02/26/2019 01/29/2019  Depression screen PHQ 2/9  Decreased Interest 2 1 0 1 0  Down, Depressed, Hopeless 1 0 0 0 0  PHQ - 2 Score 3 1 0 1 0  Altered sleeping 2 2 0 2 2  Tired, decreased energy '2 1 1 2 1  '$ Change in appetite 0 1 0 1 2  Feeling bad or failure about yourself  0 0 0 0 0  Trouble concentrating 0 0 0 0 0  Moving slowly or fidgety/restless 0 0 0 0 0  Suicidal thoughts 0 0 0 0 0  PHQ-9 Score '7 5 1 6 5  '$ Difficult doing work/chores Not difficult at all Somewhat difficult - Somewhat difficult Somewhat difficult   Interpretation of Total Score  Total Score Depression Severity:  1-4 = Minimal depression, 5-9 = Mild depression, 10-14 = Moderate depression, 15-19 = Moderately severe depression, 20-27 = Severe depression   Psychosocial Evaluation and Intervention:  Psychosocial Evaluation - 05/08/22 1046       Psychosocial Evaluation & Interventions   Interventions Encouraged to exercise with the program and follow exercise prescription;Stress management education;Relaxation education    Comments [name] states that his health makes his life stressful. He has been  having trouble sleeping. He takes medication for his depression and anxiety.He can look to his wife and his two sisters for support.    Expected Outcomes Short: Exercise regularly to support mental health and notify staff of any changes. Long:  maintain mental health and well being through teaching of rehab or prescribed medications independently.    Continue Psychosocial Services  Follow up required by staff             Psychosocial Re-Evaluation:   Psychosocial Discharge (Final Psychosocial Re-Evaluation):   Education: Education Goals: Education classes will be provided on a weekly basis, covering required topics. Participant will state understanding/return demonstration of topics presented.  Learning Barriers/Preferences:  Learning Barriers/Preferences - 05/08/22 1042       Learning Barriers/Preferences   Learning Barriers None    Learning Preferences Group Instruction;Individual Instruction;Pictoral;Skilled Demonstration;Verbal Instruction;Video;Written Material             General Pulmonary Education Topics:  Infection Prevention: - Provides verbal and written material to individual with discussion of infection control including proper hand washing and proper equipment cleaning during exercise session. Flowsheet Row Pulmonary Rehab from 06/19/2022 in [facility] Cardiac and Pulmonary Rehab  Date 05/08/22  Educator [facility]  Instruction Review Code 1- Verbalizes Understanding       Falls Prevention: - Provides verbal and written material to individual with discussion of falls prevention and safety. Flowsheet Row Pulmonary Rehab from 06/19/2022 in [facility] Cardiac and Pulmonary Rehab  Date 05/08/22  Educator [facility]  Instruction Review Code 1- Verbalizes Understanding       Chronic Lung Disease Review: - Group verbal instruction with posters, models, PowerPoint presentations and videos,  to review new updates, new respiratory medications, new advancements in procedures and treatments. Providing information on websites and "800" numbers for continued self-education. Includes information about supplement oxygen, available portable oxygen systems, continuous and intermittent flow rates, oxygen safety, concentrators, and  Medicare reimbursement for oxygen. Explanation of Pulmonary Drugs, including class, frequency, complications, importance of spacers, rinsing mouth after steroid MDI's, and proper cleaning methods for nebulizers. Review of basic lung anatomy and physiology related to function, structure, and complications of lung disease. Review of risk factors. Discussion about methods for diagnosing sleep apnea and types of masks and machines for OSA. Includes a review of the use of types of environmental controls: home humidity, furnaces, filters, dust mite/pet prevention, HEPA vacuums. Discussion about weather changes, air quality and the benefits of nasal washing. Instruction on Warning signs, infection symptoms, calling MD promptly, preventive modes, and value of vaccinations. Review of effective airway clearance, coughing and/or vibration techniques. Emphasizing that all should Create an Action Plan. Written material given at graduation. Flowsheet Row Pulmonary Rehab from 11/03/2021 in [facility] Cardiac and Pulmonary Rehab  Date 09/22/21  Educator [facility]  Instruction Review Code 1- Verbalizes Understanding       AED/CPR: - Group verbal and written instruction with the use of models to demonstrate the basic use of the AED with the basic ABC's of resuscitation. Flowsheet Row Pulmonary Rehab from 02/26/2019 in [facility] Cardiac and Pulmonary Rehab  Date 01/01/19  Educator [facility]  Instruction Review Code 1- Verbalizes Understanding        Anatomy and Cardiac Procedures: - Group verbal and visual presentation and models provide information about basic cardiac anatomy and function. Reviews the testing methods done to diagnose heart disease and the outcomes of the test results. Describes the treatment choices: Medical Management, Angioplasty, or Coronary Bypass Surgery for treating various heart conditions including Myocardial  Infarction, Angina, Valve Disease, and Cardiac Arrhythmias.  Written material given at  graduation.   Medication Safety: - Group verbal and visual instruction to review commonly prescribed medications for heart and lung disease. Reviews the medication, class of the drug, and side effects. Includes the steps to properly store meds and maintain the prescription regimen.  Written material given at graduation. Flowsheet Row Pulmonary Rehab from 11/03/2021 in [facility] Cardiac and Pulmonary Rehab  Date 09/01/21  Educator [facility]  Instruction Review Code 1- Verbalizes Understanding       Other: -Provides group and verbal instruction on various topics (see comments)   Knowledge Questionnaire Score:  Knowledge Questionnaire Score - 06/19/22 1452       Knowledge Questionnaire Score   Pre Score 17/18              Core Components/Risk Factors/Patient Goals at Admission:  Personal Goals and Risk Factors at Admission - 06/19/22 1452       Core Components/Risk Factors/Patient Goals on Admission    Weight Management Yes;Weight Loss;Obesity    Intervention Weight Management: Develop a combined nutrition and exercise program designed to reach desired caloric intake, while maintaining appropriate intake of nutrient and fiber, sodium and fats, and appropriate energy expenditure required for the weight goal.;Weight Management: Provide education and appropriate resources to help participant work on and attain dietary goals.;Weight Management/Obesity: Establish reasonable short term and long term weight goals.;Obesity: Provide education and appropriate resources to help participant work on and attain dietary goals.    Admit Weight 184 lb 4.8 oz (83.6 kg)    Goal Weight: Short Term 180 lb (81.6 kg)    Goal Weight: Long Term 180 lb (81.6 kg)    Expected Outcomes Short Term: Continue to assess and modify interventions until short term weight is achieved;Long Term: Adherence to nutrition and physical activity/exercise program aimed toward attainment of established weight goal;Weight Loss:  Understanding of general recommendations for a balanced deficit meal plan, which promotes 1-2 lb weight loss per week and includes a negative energy balance of [phone] kcal/d;Understanding of distribution of calorie intake throughout the day with the consumption of 4-5 meals/snacks;Understanding recommendations for meals to include 15-35% energy as protein, 25-35% energy from fat, 35-60% energy from carbohydrates, less than '200mg'$  of dietary cholesterol, 20-35 gm of total fiber daily    Intervention Provide education, individualized exercise plan and daily activity instruction to help decrease symptoms of SOB with activities of daily living.    Expected Outcomes Short Term: Improve cardiorespiratory fitness to achieve a reduction of symptoms when performing ADLs;Long Term: Be able to perform more ADLs without symptoms or delay the onset of symptoms    Increase knowledge of respiratory medications and ability to use respiratory devices properly  Yes    Intervention Provide education and demonstration as needed of appropriate use of medications, inhalers, and oxygen therapy.    Expected Outcomes Short Term: Achieves understanding of medications use. Understands that oxygen is a medication prescribed by physician. Demonstrates appropriate use of inhaler and oxygen therapy.;Long Term: Maintain appropriate use of medications, inhalers, and oxygen therapy.    Diabetes Yes    Intervention Provide education about signs/symptoms and action to take for hypo/hyperglycemia.;Provide education about proper nutrition, including hydration, and aerobic/resistive exercise prescription along with prescribed medications to achieve blood glucose in normal ranges: Fasting glucose 65-99 mg/dL    Expected Outcomes Short Term: Participant verbalizes understanding of the signs/symptoms and immediate care of hyper/hypoglycemia, proper foot care and importance of medication, aerobic/resistive exercise  and nutrition plan for blood  glucose control.;Long Term: Attainment of HbA1C < 7%.    Hypertension Yes    Intervention Provide education on lifestyle modifcations including regular physical activity/exercise, weight management, moderate sodium restriction and increased consumption of fresh fruit, vegetables, and low fat dairy, alcohol moderation, and smoking cessation.;Monitor prescription use compliance.    Expected Outcomes Short Term: Continued assessment and intervention until BP is < 140/77m HG in hypertensive participants. < 130/845mHG in hypertensive participants with diabetes, heart failure or chronic kidney disease.;Long Term: Maintenance of blood pressure at goal levels.    Lipids Yes    Intervention Provide education and support for participant on nutrition & aerobic/resistive exercise along with prescribed medications to achieve LDL '70mg'$ , HDL >'40mg'$ .    Expected Outcomes Short Term: Participant states understanding of desired cholesterol values and is compliant with medications prescribed. Participant is following exercise prescription and nutrition guidelines.;Long Term: Cholesterol controlled with medications as prescribed, with individualized exercise RX and with personalized nutrition plan. Value goals: LDL < '70mg'$ , HDL > 40 mg.             Education:Diabetes - Individual verbal and written instruction to review signs/symptoms of diabetes, desired ranges of glucose level fasting, after meals and with exercise. Acknowledge that pre and post exercise glucose checks will be done for 3 sessions at entry of program. [facility]  Date 05/08/22  [facility] Code 1- Verbalizes Understanding       Know Your Numbers and Heart Failure: - Group verbal and visual instruction to discuss disease risk factors for cardiac and pulmonary disease and treatment options.  Reviews associated critical values for Overweight/Obesity,  Hypertension, Cholesterol, and Diabetes.  Discusses basics of heart failure: signs/symptoms and treatments.  Introduces Heart Failure Zone chart for action plan for heart failure.  Written material given at graduation. Flowsheet [facility]  Date 07/14/21  Educator [facility]  Instruction Review Code 1- Verbalizes Understanding       Core Components/Risk Factors/Patient Goals Review:    Core Components/Risk Factors/Patient Goals at Discharge (Final Review):    ITP Comments:  ITP Comments     Row Name 05/08/22 1041 06/19/22 1443         ITP Comments Virtual Visit completed. Patient informed on EP and RD appointment and 6 Minute walk test. Patient also informed of patient health questionnaires on My Chart. Patient Verbalizes understanding. [facility] [place]. Initial ITP created and sent for review to Dr. FaZetta BillsMedical Director.               Comments: Initial ITP

## 2022-06-22 DIAGNOSIS — J449 Chronic obstructive pulmonary disease, unspecified: Secondary | ICD-10-CM | POA: Diagnosis not present

## 2022-06-22 DIAGNOSIS — R0602 Shortness of breath: Secondary | ICD-10-CM

## 2022-06-22 LAB — GLUCOSE, CAPILLARY
Glucose-Capillary: 111 mg/dL — ABNORMAL HIGH (ref 70–99)
Glucose-Capillary: 128 mg/dL — ABNORMAL HIGH (ref 70–99)

## 2022-06-22 NOTE — Progress Notes (Signed)
Daily Session Note  Patient Details  Name: Darius Norris MRN: [id number] Date of Birth: 1951/09/17 Referring Provider:   Flowsheet Row Pulmonary Rehab from 06/19/2022 in [facility] Cardiac and Pulmonary Rehab  Referring Provider Delorise Jackson MD  [Dr. Jenne Pane ([facility])]       Encounter Date: 06/22/2022  Check In:  Session Check In - 06/22/22 1044       Check-In   Supervising physician immediately available to respond to emergencies See telemetry face sheet for immediately available ER MD    Location [facility]-Cardiac & Pulmonary Rehab    Staff Present Antionette Fairy, BS, Exercise Physiologist;Joseph Lucerne, RCP,RRT,BSRT;Joachim Carton [place], MPA, RN;Melissa Athol, RDN, LDN    Virtual Visit No    Medication changes reported     No    Fall or balance concerns reported    No    Tobacco Cessation No Change    Warm-up and Cool-down Performed on first and last piece of equipment    Resistance Training Performed Yes    VAD Patient? No    PAD/SET Patient? No      Pain Assessment   Currently in Pain? No/denies    Multiple Pain Sites No                Social History   Tobacco Use  Smoking Status Former   Packs/day: 1.00   Years: 30.00   Total pack years: 30.00   Types: Cigarettes   Quit date: 12/18/2001   Years since quitting: 20.5  Smokeless Tobacco Never    Goals Met:  Independence with exercise equipment Exercise tolerated well No report of concerns or symptoms today Strength training completed today  Goals Unmet:  Not Applicable  Comments: First full day of exercise!  Patient was oriented to gym and equipment including functions, settings, policies, and procedures.  Patient's individual exercise prescription and treatment plan were reviewed.  All starting workloads were established based on the results of the 6 minute walk test done at initial orientation visit.  The plan for exercise progression was also introduced and progression will be customized based on patient's  performance and goals.    Dr. Emily Filbert is Medical Director for [place].  Dr. Ottie Glazier is Medical Director for [facility] Pulmonary Rehabilitation.

## 2022-06-27 DIAGNOSIS — J449 Chronic obstructive pulmonary disease, unspecified: Secondary | ICD-10-CM

## 2022-06-27 LAB — GLUCOSE, CAPILLARY
Glucose-Capillary: 155 mg/dL — ABNORMAL HIGH (ref 70–99)
Glucose-Capillary: 106 mg/dL — ABNORMAL HIGH (ref 70–99)

## 2022-06-27 NOTE — Progress Notes (Signed)
Daily Session Note  Patient Details  Name: BRAISON SNOKE MRN: [id number] Date of Birth: November 15, 1951 Referring Provider:   Flowsheet Row Pulmonary Rehab from 06/19/2022 in [facility] Cardiac and Pulmonary Rehab  Referring Provider Delorise Jackson MD  [Dr. Jenne Pane ([facility])]       Encounter Date: 06/27/2022  Check In:  Session Check In - 06/27/22 1058       Check-In   Supervising physician immediately available to respond to emergencies See telemetry face sheet for immediately available ER MD    Location [facility]-Cardiac & Pulmonary Rehab    Staff Present Birdie Sons, MPA, RN;Jessica Devola, MA, RCEP, CCRP, CCET;Melissa Caiola, RDN, LDN    Virtual Visit No    Medication changes reported     No    Fall or balance concerns reported    No    Tobacco Cessation No Change    Warm-up and Cool-down Performed on first and last piece of equipment    Resistance Training Performed Yes    VAD Patient? No    PAD/SET Patient? No      Pain Assessment   Currently in Pain? No/denies                Social History   Tobacco Use  Smoking Status Former   Packs/day: 1.00   Years: 30.00   Total pack years: 30.00   Types: Cigarettes   Quit date: 12/18/2001   Years since quitting: 20.5  Smokeless Tobacco Never    Goals Met:  Independence with exercise equipment Exercise tolerated well No report of concerns or symptoms today Strength training completed today  Goals Unmet:  Not Applicable  Comments: Pt able to follow exercise prescription today without complaint.  Will continue to monitor for progression.    Dr. Emily Filbert is Medical Director for [place].  Dr. Ottie Glazier is Medical Director for [facility] Pulmonary Rehabilitation.

## 2022-06-28 ENCOUNTER — Encounter: Payer: Self-pay | Admitting: *Deleted

## 2022-06-28 DIAGNOSIS — J449 Chronic obstructive pulmonary disease, unspecified: Secondary | ICD-10-CM

## 2022-06-28 DIAGNOSIS — R0602 Shortness of breath: Secondary | ICD-10-CM

## 2022-06-28 NOTE — Progress Notes (Signed)
Pulmonary Individual Treatment Plan  Patient Details  Name: Darius Norris MRN: [id number] Date of Birth: August 01, 1951 Referring Provider:   Flowsheet Row Pulmonary Rehab from 06/19/2022 in [facility] Cardiac and Pulmonary Rehab  Referring Provider Delorise Jackson MD  [Dr. Jenne Pane ([facility])]       Initial Encounter Date:  Flowsheet Row Pulmonary Rehab from 06/19/2022 in [facility] Cardiac and Pulmonary Rehab  Date 06/19/22       Visit Diagnosis: Chronic obstructive pulmonary disease, unspecified COPD type ([place])  SOB (shortness of breath)  Patient's Home Medications on Admission:  Current Outpatient Medications:    acetaminophen (TYLENOL) 325 MG tablet, Take 650 mg by mouth every 6 (six) hours as needed., Disp: , Rfl:    albuterol (VENTOLIN HFA) 108 (90 Base) MCG/ACT inhaler, Inhale into the lungs., Disp: , Rfl:    aspirin EC 81 MG EC tablet, Take 1 tablet (81 mg total) by mouth daily., Disp: 180 tablet, Rfl: 0   atorvastatin (LIPITOR) 80 MG tablet, Take 1 tablet (80 mg total) by mouth at bedtime., Disp: 30 tablet, Rfl: 0   budesonide-formoterol (SYMBICORT) 160-4.5 MCG/ACT inhaler, Inhale 2 puffs into the lungs 2 (two) times daily., Disp: , Rfl:    busPIRone (BUSPAR) 10 MG tablet, Take by mouth., Disp: , Rfl:    clobetasol (TEMOVATE) 0.05 % external solution, Apply topically., Disp: , Rfl:    dabigatran (PRADAXA) 150 MG CAPS capsule, Take 150 mg by mouth 2 (two) times daily., Disp: , Rfl:    diltiazem (CARDIZEM CD) 300 MG 24 hr capsule, Take 1 capsule (300 mg total) by mouth daily., Disp: 30 capsule, Rfl: 2   folic acid (FOLVITE) 1 MG tablet, Take 1 tablet (1 mg total) by mouth daily., Disp: 30 tablet, Rfl: 2   furosemide (LASIX) 40 MG tablet, Take 1 tablet (40 mg total) by mouth daily., Disp: 30 tablet, Rfl: 2   gabapentin (NEURONTIN) 300 MG capsule, Take 1 capsule by mouth 3 (three) times daily as needed., Disp: , Rfl:    guaifenesin (HUMIBID E) 400 MG TABS tablet, Take by mouth.,  Disp: , Rfl:    hydrocortisone 2.5 % cream, Apply topically., Disp: , Rfl:    hydrOXYzine (ATARAX/VISTARIL) 25 MG tablet, Take by mouth., Disp: , Rfl:    Ipratropium-Albuterol (COMBIVENT) 20-100 MCG/ACT AERS respimat, Inhale 1 puff into the lungs every 6 (six) hours as needed for wheezing or shortness of breath., Disp: , Rfl:    isosorbide mononitrate (IMDUR) 60 MG 24 hr tablet, Take 60 mg by mouth daily., Disp: , Rfl:    ketorolac (ACULAR) 0.5 % ophthalmic solution, INSTILL 1 DROP OPERATIVE EYE FOUR TIMES A DAY FOR POSTOPERATIVE PAIN/INFLAMMATION SEND TO 4B AMB SURGERY OR ON DAY OF SURGERY 04/05/22, Disp: , Rfl:    lidocaine (XYLOCAINE) 5 % ointment, Apply topically., Disp: , Rfl:    loratadine (CLARITIN) 10 MG tablet, Take 10 mg by mouth daily as needed for allergies. , Disp: , Rfl:    Melatonin 3 MG CAPS, Take 2 capsules by mouth at bedtime., Disp: , Rfl:    metFORMIN (GLUCOPHAGE-XR) 500 MG 24 hr tablet, Take 500 mg by mouth in the morning and at bedtime., Disp: , Rfl:    methotrexate (RHEUMATREX) 2.5 MG tablet, Take 10 mg by mouth once a week., Disp: , Rfl:    metoprolol tartrate (LOPRESSOR) 100 MG tablet, Take 1 tablet (100 mg total) by mouth 2 (two) times daily., Disp: 60 tablet, Rfl: 2   mometasone (ASMANEX) 220 MCG/INH inhaler,  Take by mouth., Disp: , Rfl:    moxifloxacin (VIGAMOX) 0.5 % ophthalmic solution, INSTILL 1 DROP IN LEFT EYE FOUR TIMES A DAY FOR CATARACT SURGERY SEND TO 4B AMB SURGERY OR ON DAY OF SURGERY 04/05/22, Disp: , Rfl:    senna (SENOKOT) 8.6 MG TABS tablet, Take 1 tablet by mouth daily as needed for mild constipation., Disp: , Rfl:    simethicone (MYLICON) 80 MG chewable tablet, CHEW ONE TABLET BY MOUTH THREE TIMES A DAY AFTER MEALS AS NEEDED FOR GAS, Disp: , Rfl:   Past Medical History: Past Medical History:  Diagnosis Date   A-fib ([place])    CAD (coronary artery disease)    Colitis    COPD (chronic obstructive pulmonary disease) (HCC)    Emphysema lung (HCC)    GERD  (gastroesophageal reflux disease)    Hypertension    MI (myocardial infarction) ([place]) 1999    Tobacco Use: Social History   Tobacco Use  Smoking Status Former   Packs/day: 1.00   Years: 30.00   Total pack years: 30.00   Types: Cigarettes   Quit date: 12/18/2001   Years since quitting: 20.5  Smokeless Tobacco Never    Labs: Review Flowsheet  More data may exist      Latest Ref Rng & Units 02/04/2013 09/04/2016 05/02/2018 01/05/2019 12/06/2021  Labs for ITP Cardiac and Pulmonary Rehab  Cholestrol 0 - 200 mg/dL 185  116  - - 106   LDL (calc) 0 - 99 mg/dL 114  51  - - 51   HDL-C >40 mg/dL 38  42  - - 33   Trlycerides <150 mg/dL 166  116  - - 108   Hemoglobin A1c 4.8 - 5.6 % - 6.4  - - 5.9   Bicarbonate 20.0 - 28.0 mmol/L - - 36.3  29.6  -  O2 Saturation % - - - 81.7  -     Pulmonary Assessment Scores:  Pulmonary Assessment Scores     Row Name 06/19/22 1455         ADL UCSD   ADL Phase Entry     SOB Score total 49     Rest 0     Walk 2     Stairs 4     Bath 3     Dress 1     Shop 2       CAT Score   CAT Score 17       mMRC Score   mMRC Score 2              UCSD: Self-administered rating of dyspnea associated with activities of daily living (ADLs) 6-point scale (0 = "not at all" to 5 = "maximal or unable to do because of breathlessness")  Scoring Scores range from 0 to 120.  Minimally important difference is 5 units  CAT: CAT can identify the health impairment of COPD patients and is better correlated with disease progression.  CAT has a scoring range of zero to 40. The CAT score is classified into four groups of low (less than 10), medium (10 - 20), high (21-30) and very high (31-40) based on the impact level of disease on health status. A CAT score over 10 suggests significant symptoms.  A worsening CAT score could be explained by an exacerbation, poor medication adherence, poor inhaler technique, or progression of COPD or comorbid conditions.  CAT MCID is  2 points  mMRC: mMRC (Modified Medical Research Council) Dyspnea Scale is used to assess  the degree of baseline functional disability in patients of respiratory disease due to dyspnea. No minimal important difference is established. A decrease in score of 1 point or greater is considered a positive change.   Pulmonary Function Assessment:  Pulmonary Function Assessment - 06/19/22 1455       Breath   Shortness of Breath Yes;Fear of Shortness of Breath;Limiting activity             Exercise Target Goals: Exercise Program Goal: Individual exercise prescription set using results from initial 6 min walk test and THRR while considering  patient's activity barriers and safety.   Exercise Prescription Goal: Initial exercise prescription builds to 30-45 minutes a day of aerobic activity, 2-3 days per week.  Home exercise guidelines will be given to patient during program as part of exercise prescription that the participant will acknowledge.  Education: Aerobic Exercise: - Group verbal and visual presentation on the components of exercise prescription. Introduces F.I.T.T principle from ACSM for exercise prescriptions.  Reviews F.I.T.T. principles of aerobic exercise including progression. Written material given at graduation. Flowsheet Row Pulmonary Rehab from 11/03/2021 in [facility] Cardiac and Pulmonary Rehab  Date 10/13/21  Educator [facility]  Instruction Review Code 1- Verbalizes Understanding       Education: Resistance Exercise: - Group verbal and visual presentation on the components of exercise prescription. Introduces F.I.T.T principle from ACSM for exercise prescriptions  Reviews F.I.T.T. principles of resistance exercise including progression. Written material given at graduation.    Education: Exercise & Equipment Safety: - Individual verbal instruction and demonstration of equipment use and safety with use of the equipment. Flowsheet Row Pulmonary Rehab from 06/19/2022 in [facility] Cardiac  and Pulmonary Rehab  Date 05/08/22  Educator [facility]  Instruction Review Code 1- Verbalizes Understanding       Education: Exercise Physiology & General Exercise Guidelines: - Group verbal and written instruction with models to review the exercise physiology of the cardiovascular system and associated critical values. Provides general exercise guidelines with specific guidelines to those with heart or lung disease.  Flowsheet Row Pulmonary Rehab from 11/03/2021 in [facility] Cardiac and Pulmonary Rehab  Date 08/04/21  Educator AS  Instruction Review Code 1- Verbalizes Understanding       Education: Flexibility, Balance, Mind/Body Relaxation: - Group verbal and visual presentation with interactive activity on the components of exercise prescription. Introduces F.I.T.T principle from ACSM for exercise prescriptions. Reviews F.I.T.T. principles of flexibility and balance exercise training including progression. Also discusses the mind body connection.  Reviews various relaxation techniques to help reduce and manage stress (i.e. Deep breathing, progressive muscle relaxation, and visualization). Balance handout provided to take home. Written material given at graduation. Flowsheet Row Pulmonary Rehab from 11/03/2021 in [facility] Cardiac and Pulmonary Rehab  Date 10/27/21  Educator AS  Instruction Review Code 1- Verbalizes Understanding       Activity Barriers & Risk Stratification:  Activity Barriers & Cardiac Risk Stratification - 06/19/22 1445       Activity Barriers & Cardiac Risk Stratification   Activity Barriers Shortness of Breath;Deconditioning;Back Problems;Muscular Weakness;Balance Concerns;Assistive Device;Other (comment);Joint Problems    Comments neuropathy in feet, back and hip pain             6 Minute Walk:  6 Minute Walk     Row Name 06/19/22 1443         6 Minute Walk   Phase Initial     Distance 355 feet     Walk Time 3.03 minutes     #  of Rest Breaks 2  25 sec, 17  sec, stopped at 3:44     MPH 1.33     METS 0.96     RPE 15     Perceived Dyspnea  2     VO2 Peak 3.38     Symptoms Yes (comment)     Comments hip pain 6/10, back pain 6/10, SOB, fatigue     Resting HR 62 bpm     Resting BP 146/64     Resting Oxygen Saturation  93 %     Exercise Oxygen Saturation  during 6 min walk 80 %     Max Ex. HR 74 bpm     Max Ex. BP 156/64     2 Minute Post BP 126/64       Interval HR   1 Minute HR 69     2 Minute HR 73     3 Minute HR 74     4 Minute HR 74  stopped at 3:44     2 Minute Post HR 60     Interval Heart Rate? Yes       Interval Oxygen   Interval Oxygen? Yes     Baseline Oxygen Saturation % 93 %  2L pulsed     1 Minute Oxygen Saturation % 89 %     1 Minute Liters of Oxygen 2 L  pulsed     2 Minute Oxygen Saturation % 83 %     2 Minute Liters of Oxygen 2 L     3 Minute Oxygen Saturation % 81 %     3 Minute Liters of Oxygen 3 L     4 Minute Oxygen Saturation % 80 %     4 Minute Liters of Oxygen 3 L     2 Minute Post Oxygen Saturation % 92 %     2 Minute Post Liters of Oxygen 3 L             Oxygen Initial Assessment:  Oxygen Initial Assessment - 06/19/22 1453       Home Oxygen   Home Oxygen Device Home Concentrator;E-Tanks    Sleep Oxygen Prescription Continuous    Liters per minute 3    Home Exercise Oxygen Prescription Pulsed    Liters per minute 3    Home Resting Oxygen Prescription Pulsed    Liters per minute 3    Compliance with Home Oxygen Use Yes      Initial 6 min Walk   Oxygen Used Pulsed;E-Tanks    Liters per minute 3      Program Oxygen Prescription   Program Oxygen Prescription Continuous;E-Tanks    Liters per minute 3      Intervention   Short Term Goals To learn and exhibit compliance with exercise, home and travel O2 prescription;To learn and understand importance of monitoring SPO2 with pulse oximeter and demonstrate accurate use of the pulse oximeter.;To learn and understand importance of maintaining  oxygen saturations>88%;To learn and demonstrate proper pursed lip breathing techniques or other breathing techniques. ;To learn and demonstrate proper use of respiratory medications    Long  Term Goals Exhibits compliance with exercise, home  and travel O2 prescription;Verbalizes importance of monitoring SPO2 with pulse oximeter and return demonstration;Maintenance of O2 saturations>88%;Exhibits proper breathing techniques, such as pursed lip breathing or other method taught during program session;Compliance with respiratory medication;Demonstrates proper use of MDI's             Oxygen Re-Evaluation:  Oxygen Discharge (Final Oxygen Re-Evaluation):   Initial Exercise Prescription:  Initial Exercise Prescription - 06/19/22 1400       Date of Initial Exercise RX and Referring Provider   Date 06/19/22    Referring Provider Delorise Jackson MD   Dr. Jenne Pane ([facility])     Oxygen   Oxygen Continuous    Liters 3    Maintain Oxygen Saturation 88% or higher      Recumbant Bike   Level 1    RPM 50    Watts 10    Minutes 15    METs 1.5      NuStep   Level 1    SPM 80    Minutes 15    METs 1.5      REL-XR   Level 1    Speed 50    Minutes 15    METs 1.5      T5 Nustep   Level 1    SPM 80    Minutes 15    METs 1.5      Biostep-RELP   Level 1    SPM 50    Minutes 15    METs 2      Track   Laps 5    Minutes 15    METs 1.27      Prescription Details   Frequency (times per week) 2    Duration Progress to 30 minutes of continuous aerobic without signs/symptoms of physical distress      Intensity   THRR 40-80% of Max Heartrate 97-132    Ratings of Perceived Exertion 11-13    Perceived Dyspnea 0-4      Progression   Progression Continue to progress workloads to maintain intensity without signs/symptoms of physical distress.      Resistance Training   Training Prescription Yes    Weight 3 kb    Reps 10-15             Perform Capillary Blood  Glucose checks as needed.  Exercise Prescription Changes:   Exercise Prescription Changes     Row Name 06/19/22 1400             Response to Exercise   Blood Pressure (Admit) 146/64       Blood Pressure (Exercise) 156/64       Blood Pressure (Exit) 128/60       Heart Rate (Admit) 62 bpm       Heart Rate (Exercise) 74 bpm       Heart Rate (Exit) 60 bpm       Oxygen Saturation (Admit) 93 %       Oxygen Saturation (Exercise) 80 %       Oxygen Saturation (Exit) 96 %       Rating of Perceived Exertion (Exercise) 15       Perceived Dyspnea (Exercise) 2       Symptoms hip and back pain 6/10, SOB       Comments walk test results                Exercise Comments:   Exercise Comments     Row Name 06/22/22 1046           Exercise Comments First full day of exercise!  Patient was oriented to gym and equipment including functions, settings, policies, and procedures.  Patient's individual exercise prescription and treatment plan were reviewed.  All starting workloads were established based on the results of the 6 minute walk  test done at initial orientation visit.  The plan for exercise progression was also introduced and progression will be customized based on patient's performance and goals.                Exercise Goals and Review:   Exercise Goals     Row Name 06/19/22 1449             Exercise Goals   Increase Physical Activity Yes       Intervention Provide advice, education, support and counseling about physical activity/exercise needs.;Develop an individualized exercise prescription for aerobic and resistive training based on initial evaluation findings, risk stratification, comorbidities and participant's personal goals.       Expected Outcomes Short Term: Attend rehab on a regular basis to increase amount of physical activity.;Long Term: Add in home exercise to make exercise part of routine and to increase amount of physical activity.;Long Term: Exercising  regularly at least 3-5 days a week.       Increase Strength and Stamina Yes       Intervention Provide advice, education, support and counseling about physical activity/exercise needs.;Develop an individualized exercise prescription for aerobic and resistive training based on initial evaluation findings, risk stratification, comorbidities and participant's personal goals.       Expected Outcomes Short Term: Increase workloads from initial exercise prescription for resistance, speed, and METs.;Short Term: Perform resistance training exercises routinely during rehab and add in resistance training at home;Long Term: Improve cardiorespiratory fitness, muscular endurance and strength as measured by increased METs and functional capacity (6MWT)       Able to understand and use rate of perceived exertion (RPE) scale Yes       Intervention Provide education and explanation on how to use RPE scale       Expected Outcomes Short Term: Able to use RPE daily in rehab to express subjective intensity level;Long Term:  Able to use RPE to guide intensity level when exercising independently       Able to understand and use Dyspnea scale Yes       Intervention Provide education and explanation on how to use Dyspnea scale       Expected Outcomes Short Term: Able to use Dyspnea scale daily in rehab to express subjective sense of shortness of breath during exertion;Long Term: Able to use Dyspnea scale to guide intensity level when exercising independently       Knowledge and understanding of Target Heart Rate Range (THRR) Yes       Intervention Provide education and explanation of THRR including how the numbers were predicted and where they are located for reference       Expected Outcomes Short Term: Able to state/look up THRR;Short Term: Able to use daily as guideline for intensity in rehab;Long Term: Able to use THRR to govern intensity when exercising independently       Able to check pulse independently Yes        Intervention Provide education and demonstration on how to check pulse in carotid and radial arteries.;Review the importance of being able to check your own pulse for safety during independent exercise       Expected Outcomes Short Term: Able to explain why pulse checking is important during independent exercise;Long Term: Able to check pulse independently and accurately       Understanding of Exercise Prescription Yes       Intervention Provide education, explanation, and written materials on patient's individual exercise prescription  Expected Outcomes Short Term: Able to explain program exercise prescription;Long Term: Able to explain home exercise prescription to exercise independently                Exercise Goals Re-Evaluation :  Exercise Goals Re-Evaluation     Row Name 06/22/22 1046             Exercise Goal Re-Evaluation   Comments Reviewed RPE and dyspnea scales, THR and program prescription with pt today.  Pt voiced understanding and was given a copy of goals to take home.       Expected Outcomes Short: Use RPE daily to regulate intensity. Long: Follow program prescription in THR.                Discharge Exercise Prescription (Final Exercise Prescription Changes):  Exercise Prescription Changes - 06/19/22 1400       Response to Exercise   Blood Pressure (Admit) 146/64    Blood Pressure (Exercise) 156/64    Blood Pressure (Exit) 128/60    Heart Rate (Admit) 62 bpm    Heart Rate (Exercise) 74 bpm    Heart Rate (Exit) 60 bpm    Oxygen Saturation (Admit) 93 %    Oxygen Saturation (Exercise) 80 %    Oxygen Saturation (Exit) 96 %    Rating of Perceived Exertion (Exercise) 15    Perceived Dyspnea (Exercise) 2    Symptoms hip and back pain 6/10, SOB    Comments walk test results             Nutrition:  Target Goals: Understanding of nutrition guidelines, daily intake of sodium '1500mg'$ , cholesterol '200mg'$ , calories 30% from fat and 7% or less from  saturated fats, daily to have 5 or more servings of fruits and vegetables.  Education: All About Nutrition: -Group instruction provided by verbal, written material, interactive activities, discussions, models, and posters to present general guidelines for heart healthy nutrition including fat, fiber, MyPlate, the role of sodium in heart healthy nutrition, utilization of the nutrition label, and utilization of this knowledge for meal planning. Follow up email sent as well. Written material given at graduation. Flowsheet Row Pulmonary Rehab from 06/19/2022 in [facility] Cardiac and Pulmonary Rehab  Education need identified 06/19/22       Biometrics:  Pre Biometrics - 06/19/22 1451       Pre Biometrics   Height 5' 5.6" (1.666 m)    Weight 184 lb 4.8 oz (83.6 kg)    BMI (Calculated) 30.12    Single Leg Stand 0 seconds              Nutrition Therapy Plan and Nutrition Goals:  Nutrition Therapy & Goals - 06/19/22 1451       Intervention Plan   Intervention Prescribe, educate and counsel regarding individualized specific dietary modifications aiming towards targeted core components such as weight, hypertension, lipid management, diabetes, heart failure and other comorbidities.    Expected Outcomes Short Term Goal: Understand basic principles of dietary content, such as calories, fat, sodium, cholesterol and nutrients.;Short Term Goal: A plan has been developed with personal nutrition goals set during dietitian appointment.;Long Term Goal: Adherence to prescribed nutrition plan.             Nutrition Assessments:  MEDIFICTS Score Key: ?70 Need to make dietary changes  40-70 Heart Healthy Diet ? 40 Therapeutic Level Cholesterol Diet  Flowsheet Row Pulmonary Rehab from 06/19/2022 in [facility] Cardiac and Pulmonary Rehab  Picture Your Plate Total Score on Admission  31      Picture Your Plate Scores: <44 Unhealthy dietary pattern with much room for improvement. 41-50 Dietary pattern  unlikely to meet recommendations for good health and room for improvement. 51-60 More healthful dietary pattern, with some room for improvement.  >60 Healthy dietary pattern, although there may be some specific behaviors that could be improved.   Nutrition Goals Re-Evaluation:   Nutrition Goals Discharge (Final Nutrition Goals Re-Evaluation):   Psychosocial: Target Goals: Acknowledge presence or absence of significant depression and/or stress, maximize coping skills, provide positive support system. Participant is able to verbalize types and ability to use techniques and skills needed for reducing stress and depression.   Education: Stress, Anxiety, and Depression - Group verbal and visual presentation to define topics covered.  Reviews how body is impacted by stress, anxiety, and depression.  Also discusses healthy ways to reduce stress and to treat/manage anxiety and depression.  Written material given at graduation. Flowsheet Row Pulmonary Rehab from 11/03/2021 in [facility] Cardiac and Pulmonary Rehab  Date 07/28/21  Educator [facility]  Instruction Review Code 1- [employer] Understanding       Education: Sleep Hygiene -Provides group verbal and written instruction about how sleep can affect your health.  Define sleep hygiene, discuss sleep cycles and impact of sleep habits. Review good sleep hygiene tips.    Initial Review & Psychosocial Screening:  Initial Psych Review & Screening - 05/08/22 1044       Initial Review   Current issues with Current Sleep Concerns;Current Psychotropic Meds;Current Depression;Current Anxiety/Panic      Family Dynamics   Good Support System? Yes    Comments [name] states that his health makes his life stressful. He has been having trouble sleeping. He takes medication for his depression and anxiety.He can look to his wife and his two sisters for support.      Barriers   Psychosocial barriers to participate in program There are no identifiable barriers or  psychosocial needs.;The patient should benefit from training in stress management and relaxation.      Screening Interventions   Interventions Encouraged to exercise;To provide support and resources with identified psychosocial needs;Provide feedback about the scores to participant    Expected Outcomes Short Term goal: Utilizing psychosocial counselor, staff and physician to assist with identification of specific Stressors or current issues interfering with healing process. Setting desired goal for each stressor or current issue identified.;Long Term Goal: Stressors or current issues are controlled or eliminated.;Short Term goal: Identification and review with participant of any Quality of Life or Depression concerns found by scoring the questionnaire.;Long Term goal: The participant improves quality of Life and PHQ9 Scores as seen by post scores and/or verbalization of changes             Quality of Life Scores:  Scores of 19 and below usually indicate a poorer quality of life in these areas.  A difference of  2-3 points is a clinically meaningful difference.  A difference of 2-3 points in the total score of the Quality of Life Index has been associated with significant improvement in overall quality of life, self-image, physical symptoms, and general health in studies assessing change in quality of life.  PHQ-9: Review Flowsheet  More data exists      06/19/2022 11/17/2021 06/13/2021 02/26/2019 01/29/2019  Depression screen PHQ 2/9  Decreased Interest 2 1 0 1 0  Down, Depressed, Hopeless 1 0 0 0 0  PHQ - 2 Score 3 1 0 1 0  Altered sleeping  2 2 0 2 2  Tired, decreased energy '2 1 1 2 1  '$ Change in appetite 0 1 0 1 2  Feeling bad or failure about yourself  0 0 0 0 0  Trouble concentrating 0 0 0 0 0  Moving slowly or fidgety/restless 0 0 0 0 0  Suicidal thoughts 0 0 0 0 0  PHQ-9 Score '7 5 1 6 5  '$ Difficult doing work/chores Not difficult at all Somewhat difficult - Somewhat difficult Somewhat  difficult   Interpretation of Total Score  Total Score Depression Severity:  1-4 = Minimal depression, 5-9 = Mild depression, 10-14 = Moderate depression, 15-19 = Moderately severe depression, 20-27 = Severe depression   Psychosocial Evaluation and Intervention:  Psychosocial Evaluation - 05/08/22 1046       Psychosocial Evaluation & Interventions   Interventions Encouraged to exercise with the program and follow exercise prescription;Stress management education;Relaxation education    Comments [name] states that his health makes his life stressful. He has been having trouble sleeping. He takes medication for his depression and anxiety.He can look to his wife and his two sisters for support.    Expected Outcomes Short: Exercise regularly to support mental health and notify staff of any changes. Long: maintain mental health and well being through teaching of rehab or prescribed medications independently.    Continue Psychosocial Services  Follow up required by staff             Psychosocial Re-Evaluation:   Psychosocial Discharge (Final Psychosocial Re-Evaluation):   Education: Education Goals: Education classes will be provided on a weekly basis, covering required topics. Participant will state understanding/return demonstration of topics presented.  Learning Barriers/Preferences:  Learning Barriers/Preferences - 05/08/22 1042       Learning Barriers/Preferences   Learning Barriers None    Learning Preferences Group Instruction;Individual Instruction;Pictoral;Skilled Demonstration;Verbal Instruction;Video;Written Material             General Pulmonary Education Topics:  Infection Prevention: - Provides verbal and written material to individual with discussion of infection control including proper hand washing and proper equipment cleaning during exercise session. Flowsheet Row Pulmonary Rehab from 06/19/2022 in [facility] Cardiac and Pulmonary Rehab  Date 05/08/22  Educator  [facility]  Instruction Review Code 1- Verbalizes Understanding       Falls Prevention: - Provides verbal and written material to individual with discussion of falls prevention and safety. Flowsheet Row Pulmonary Rehab from 06/19/2022 in [facility] Cardiac and Pulmonary Rehab  Date 05/08/22  Educator [facility]  Instruction Review Code 1- Verbalizes Understanding       Chronic Lung Disease Review: - Group verbal instruction with posters, models, PowerPoint presentations and videos,  to review new updates, new respiratory medications, new advancements in procedures and treatments. Providing information on websites and "800" numbers for continued self-education. Includes information about supplement oxygen, available portable oxygen systems, continuous and intermittent flow rates, oxygen safety, concentrators, and Medicare reimbursement for oxygen. Explanation of Pulmonary Drugs, including class, frequency, complications, importance of spacers, rinsing mouth after steroid MDI's, and proper cleaning methods for nebulizers. Review of basic lung anatomy and physiology related to function, structure, and complications of lung disease. Review of risk factors. Discussion about methods for diagnosing sleep apnea and types of masks and machines for OSA. Includes a review of the use of types of environmental controls: home humidity, furnaces, filters, dust mite/pet prevention, HEPA vacuums. Discussion about weather changes, air quality and the benefits of nasal washing. Instruction on Warning signs, infection symptoms, calling MD  promptly, preventive modes, and value of vaccinations. Review of effective airway clearance, coughing and/or vibration techniques. Emphasizing that all should Create an Action Plan. Written material given at graduation. Flowsheet Row Pulmonary Rehab from 11/03/2021 in [facility] Cardiac and Pulmonary Rehab  Date 09/22/21  Educator [facility]  Instruction Review Code 1- Verbalizes Understanding       AED/CPR: -  Group verbal and written instruction with the use of models to demonstrate the basic use of the AED with the basic ABC's of resuscitation. Flowsheet Row Pulmonary Rehab from 02/26/2019 in [facility] Cardiac and Pulmonary Rehab  Date 01/01/19  Educator [facility]  Instruction Review Code 1- Verbalizes Understanding        Anatomy and Cardiac Procedures: - Group verbal and visual presentation and models provide information about basic cardiac anatomy and function. Reviews the testing methods done to diagnose heart disease and the outcomes of the test results. Describes the treatment choices: Medical Management, Angioplasty, or Coronary Bypass Surgery for treating various heart conditions including Myocardial Infarction, Angina, Valve Disease, and Cardiac Arrhythmias.  Written material given at graduation.   Medication Safety: - Group verbal and visual instruction to review commonly prescribed medications for heart and lung disease. Reviews the medication, class of the drug, and side effects. Includes the steps to properly store meds and maintain the prescription regimen.  Written material given at graduation. Flowsheet Row Pulmonary Rehab from 11/03/2021 in [facility] Cardiac and Pulmonary Rehab  Date 09/01/21  Educator [facility]  Instruction Review Code 1- Verbalizes Understanding       Other: -Provides group and verbal instruction on various topics (see comments)   Knowledge Questionnaire Score:  Knowledge Questionnaire Score - 06/19/22 1452       Knowledge Questionnaire Score   Pre Score 17/18              Core Components/Risk Factors/Patient Goals at Admission:  Personal Goals and Risk Factors at Admission - 06/19/22 1452       Core Components/Risk Factors/Patient Goals on Admission    Weight Management Yes;Weight Loss;Obesity    Intervention Weight Management: Develop a combined nutrition and exercise program designed to reach desired caloric intake, while maintaining appropriate intake of  nutrient and fiber, sodium and fats, and appropriate energy expenditure required for the weight goal.;Weight Management: Provide education and appropriate resources to help participant work on and attain dietary goals.;Weight Management/Obesity: Establish reasonable short term and long term weight goals.;Obesity: Provide education and appropriate resources to help participant work on and attain dietary goals.    Admit Weight 184 lb 4.8 oz (83.6 kg)    Goal Weight: Short Term 180 lb (81.6 kg)    Goal Weight: Long Term 180 lb (81.6 kg)    Expected Outcomes Short Term: Continue to assess and modify interventions until short term weight is achieved;Long Term: Adherence to nutrition and physical activity/exercise program aimed toward attainment of established weight goal;Weight Loss: Understanding of general recommendations for a balanced deficit meal plan, which promotes 1-2 lb weight loss per week and includes a negative energy balance of [phone] kcal/d;Understanding of distribution of calorie intake throughout the day with the consumption of 4-5 meals/snacks;Understanding recommendations for meals to include 15-35% energy as protein, 25-35% energy from fat, 35-60% energy from carbohydrates, less than '200mg'$  of dietary cholesterol, 20-35 gm of total fiber daily    Intervention Provide education, individualized exercise plan and daily activity instruction to help decrease symptoms of SOB with activities of daily living.    Expected Outcomes Short Term: Improve  cardiorespiratory fitness to achieve a reduction of symptoms when performing ADLs;Long Term: Be able to perform more ADLs without symptoms or delay the onset of symptoms    Increase knowledge of respiratory medications and ability to use respiratory devices properly  Yes    Intervention Provide education and demonstration as needed of appropriate use of medications, inhalers, and oxygen therapy.    Expected Outcomes Short Term: Achieves understanding of  medications use. Understands that oxygen is a medication prescribed by physician. Demonstrates appropriate use of inhaler and oxygen therapy.;Long Term: Maintain appropriate use of medications, inhalers, and oxygen therapy.    Diabetes Yes    Intervention Provide education about signs/symptoms and action to take for hypo/hyperglycemia.;Provide education about proper nutrition, including hydration, and aerobic/resistive exercise prescription along with prescribed medications to achieve blood glucose in normal ranges: Fasting glucose 65-99 mg/dL    Expected Outcomes Short Term: Participant verbalizes understanding of the signs/symptoms and immediate care of hyper/hypoglycemia, proper foot care and importance of medication, aerobic/resistive exercise and nutrition plan for blood glucose control.;Long Term: Attainment of HbA1C < 7%.    Hypertension Yes    Intervention Provide education on lifestyle modifcations including regular physical activity/exercise, weight management, moderate sodium restriction and increased consumption of fresh fruit, vegetables, and low fat dairy, alcohol moderation, and smoking cessation.;Monitor prescription use compliance.    Expected Outcomes Short Term: Continued assessment and intervention until BP is < 140/79m HG in hypertensive participants. < 130/860mHG in hypertensive participants with diabetes, heart failure or chronic kidney disease.;Long Term: Maintenance of blood pressure at goal levels.    Lipids Yes    Intervention Provide education and support for participant on nutrition & aerobic/resistive exercise along with prescribed medications to achieve LDL '70mg'$ , HDL >'40mg'$ .    Expected Outcomes Short Term: Participant states understanding of desired cholesterol values and is compliant with medications prescribed. Participant is following exercise prescription and nutrition guidelines.;Long Term: Cholesterol controlled with medications as prescribed, with individualized  exercise RX and with personalized nutrition plan. Value goals: LDL < '70mg'$ , HDL > 40 mg.             Education:Diabetes - Individual verbal and written instruction to review signs/symptoms of diabetes, desired ranges of glucose level fasting, after meals and with exercise. Acknowledge that pre and post exercise glucose checks will be done for 3 sessions at entry of program. [facility]  Date 05/08/22  [facility]- Verbalizes Understanding       Know Your Numbers and Heart Failure: - Group verbal and visual instruction to discuss disease risk factors for cardiac and pulmonary disease and treatment options.  Reviews associated critical values for Overweight/Obesity, Hypertension, Cholesterol, and Diabetes.  Discusses basics of heart failure: signs/symptoms and treatments.  Introduces Heart Failure Zone chart for action plan for heart failure.  Written material given at graduation. Flow[facility]  Date 07/14/21  Educator [facility]  Instruction Review Code 1- Verbalizes Understanding       Core Components/Risk Factors/Patient Goals Review:    Core Components/Risk Factors/Patient Goals at Discharge (Final Review):    ITP Comments:  ITP Comments     Row Name 05/08/22 1041 06/19/22 1443 06/22/22 1045 06/28/22 1024     ITP Comments Virtual Visit completed. Patient informed on EP and RD appointment and 6 Minute walk test. Patient also informed of patient health questionnaires on My  Chart. Patient Verbalizes understanding. Visit diagnosis can be found in [facility], patient is [place]. Completed 6MWT and gym orientation. Initial ITP created and sent for review to Dr. Zetta Bills, Medical Director. First full day of exercise!  Patient was oriented to gym and equipment including functions, settings, policies, and procedures.  Patient's  individual exercise prescription and treatment plan were reviewed.  All starting workloads were established based on the results of the 6 minute walk test done at initial orientation visit.  The plan for exercise progression was also introduced and progression will be customized based on patient's performance and goals. 30 Day review completed. Medical Director ITP review done, changes made as directed, and signed approval by Medical Director.   NEW             Comments:

## 2022-06-29 ENCOUNTER — Encounter: Payer: No Typology Code available for payment source | Admitting: *Deleted

## 2022-06-29 DIAGNOSIS — J449 Chronic obstructive pulmonary disease, unspecified: Secondary | ICD-10-CM

## 2022-06-29 LAB — GLUCOSE, CAPILLARY
Glucose-Capillary: 138 mg/dL — ABNORMAL HIGH (ref 70–99)
Glucose-Capillary: 118 mg/dL — ABNORMAL HIGH (ref 70–99)

## 2022-06-29 NOTE — Progress Notes (Signed)
Daily Session Note  Patient Details  Name: Darius Norris MRN: [id number] Date of Birth: 1951/09/06 Referring Provider:   Flowsheet Row Pulmonary Rehab from 06/19/2022 in [facility] Cardiac and Pulmonary Rehab  Referring Provider Delorise Jackson MD  [Dr. Jenne Pane ([facility])]       Encounter Date: 06/29/2022  Check In:  Session Check In - 06/29/22 1055       Check-In   Supervising physician immediately available to respond to emergencies See telemetry face sheet for immediately available ER MD    Location [facility]-Cardiac & Pulmonary Rehab    Staff Present Heath Lark, RN, BSN, CCRP;Joseph Aldora, RCP,RRT,BSRT;Melissa P[place], RDN, LDN;Frankey [place]ting [place], MA, RCEP, CCRP, CCET    Virtual Visit No    Medication changes reported     No    Fall or balance concerns reported    No    Tobacco Cessation No Change    Warm-up and Cool-down Performed on first and last piece of equipment    Resistance Training Performed Yes    VAD Patient? No    PAD/SET Patient? No      Pain Assessment   Currently in Pain? No/denies                Social History   Tobacco Use  Smoking Status Former   Packs/day: 1.00   Years: 30.00   Total pack years: 30.00   Types: Cigarettes   Quit date: 12/18/2001   Years since quitting: 20.5  Smokeless Tobacco Never    Goals Met:  Proper associated with RPD/PD & O2 Sat Independence with exercise equipment Exercise tolerated well No report of concerns or symptoms today Strength training completed today  Goals Unmet:  Not Applicable  Comments: Pt able to follow exercise prescription today without complaint.  Will continue to monitor for progression.     Dr. Emily Filbert is Medical Director for [place].  Dr. Ottie Glazier is Medical Director for [facility] Pulmonary Rehabilitation.

## 2022-07-04 DIAGNOSIS — J449 Chronic obstructive pulmonary disease, unspecified: Secondary | ICD-10-CM | POA: Diagnosis not present

## 2022-07-04 DIAGNOSIS — R0602 Shortness of breath: Secondary | ICD-10-CM

## 2022-07-04 NOTE — Progress Notes (Signed)
Daily Session Note  Patient Details  Name: Darius Norris MRN: [id number] Date of Birth: 04-Mar-1951 Referring Provider:   Flowsheet Row Pulmonary Rehab from 06/19/2022 in [facility] Cardiac and Pulmonary Rehab  Referring Provider Delorise Jackson MD  [Dr. Jenne Pane ([facility])]       Encounter Date: 07/04/2022  Check In:  Session Check In - 07/04/22 1124       Check-In   Supervising physician immediately available to respond to emergencies See telemetry face sheet for immediately available ER MD    Location [facility]-Cardiac & Pulmonary Rehab    Staff Present Earlean Shawl, BS, ACSM CEP, Exercise Physiologist;Annete Ayuso, RN,BC,MSN;Kara Eliezer Bottom, MS, ASCM CEP, Exercise Physiologist    Virtual Visit No    Medication changes reported     No    Fall or balance concerns reported    No    Tobacco Cessation No Change    Warm-up and Cool-down Performed on first and last piece of equipment    Resistance Training Performed Yes    VAD Patient? No    PAD/SET Patient? No      Pain Assessment   Currently in Pain? No/denies    Multiple Pain Sites No                Social History   Tobacco Use  Smoking Status Former   Packs/day: 1.00   Years: 30.00   Total pack years: 30.00   Types: Cigarettes   Quit date: 12/18/2001   Years since quitting: 20.5  Smokeless Tobacco Never    Goals Met:  Independence with exercise equipment Exercise tolerated well No report of concerns or symptoms today  Goals Unmet:  Not Applicable  Comments: Pt able to follow exercise prescription today without complaint.  Will continue to monitor for progression.    Dr. Emily Filbert is Medical Director for [place].  Dr. Ottie Glazier is Medical Director for [facility] Pulmonary Rehabilitation.

## 2022-07-11 DIAGNOSIS — J449 Chronic obstructive pulmonary disease, unspecified: Secondary | ICD-10-CM

## 2022-07-11 NOTE — Progress Notes (Signed)
Daily Session Note  Patient Details  Name: BRYDEN DARDEN MRN: [id number] Date of Birth: [birth year] Referring Provider:   Flowsheet Row Pulmonary Rehab from 06/19/2022 in [facility] Cardiac and Pulmonary Rehab  Referring Provider Delorise Jackson MD  [Dr. Jenne Pane ([facility])]       Encounter Date: 07/11/2022  Check In:  Session Check In - 07/11/22 1118       Check-In   Supervising physician immediately available to respond to emergencies See telemetry face sheet for immediately available ER MD    Location [facility]-Cardiac & Pulmonary Rehab    Staff Present Earlean Shawl, BS, ACSM CEP, Exercise Physiologist;Alek Borges, RN,BC,MSN;Jessica Benson, MA, RCEP, CCRP, CCET    Virtual Visit No    Medication changes reported     No    Fall or balance concerns reported    No    Tobacco Cessation No Change    Warm-up and Cool-down Performed on first and last piece of equipment    Resistance Training Performed Yes    PAD/SET Patient? No      Pain Assessment   Currently in Pain? No/denies    Multiple Pain Sites No                Social History   Tobacco Use  Smoking Status Former   Packs/day: 1.00   Years: 30.00   Total pack years: 30.00   Types: Cigarettes   Quit date: 12/18/2001   Years since quitting: 20.5  Smokeless Tobacco Never    Goals Met:  Independence with exercise equipment Exercise tolerated well No report of concerns or symptoms today Strength training completed today  Goals Unmet:  Not Applicable  Comments: Pt able to follow exercise prescription today without complaint.  Will continue to monitor for progression.    Dr. Emily Filbert is Medical Director for [place].  Dr. Ottie Glazier is Medical Director for [facility] Pulmonary Rehabilitation.

## 2022-07-18 ENCOUNTER — Encounter: Payer: No Typology Code available for payment source | Attending: Anesthesiology | Admitting: *Deleted

## 2022-07-18 DIAGNOSIS — J449 Chronic obstructive pulmonary disease, unspecified: Secondary | ICD-10-CM | POA: Diagnosis present

## 2022-07-18 DIAGNOSIS — R0602 Shortness of breath: Secondary | ICD-10-CM | POA: Diagnosis present

## 2022-07-18 NOTE — Progress Notes (Signed)
Daily Session Note  Patient Details  Name: Darius Norris MRN: [id number] Date of Birth: 07-Apr-1951 Referring Provider:   Flowsheet Row Pulmonary Rehab from 06/19/2022 in [facility] Cardiac and Pulmonary Rehab  Referring Provider Delorise Jackson MD  [Dr. Jenne Pane ([facility])]       Encounter Date: 07/18/2022  Check In:  Session Check In - 07/18/22 1149       Check-In   Supervising physician immediately available to respond to emergencies See telemetry face sheet for immediately available ER MD    Location [facility]-Cardiac & Pulmonary Rehab    Staff Present Heath Lark, RN, BSN, Laveda Norman, BS, ACSM CEP, Exercise Physiologist;Noah Tickle, BS, Exercise Physiologist    Virtual Visit No    Medication changes reported     No    Fall or balance concerns reported    No    Warm-up and Cool-down Performed on first and last piece of equipment    Resistance Training Performed Yes    VAD Patient? No    PAD/SET Patient? No      Pain Assessment   Currently in Pain? No/denies                Social History   Tobacco Use  Smoking Status Former   Packs/day: 1.00   Years: 30.00   Total pack years: 30.00   Types: Cigarettes   Quit date: 12/18/2001   Years since quitting: 20.5  Smokeless Tobacco Never    Goals Met:  Proper associated with RPD/PD & O2 Sat Independence with exercise equipment Exercise tolerated well No report of concerns or symptoms today  Goals Unmet:  Not Applicable  Comments: Pt able to follow exercise prescription today without complaint.  Will continue to monitor for progression.    Dr. Emily Filbert is Medical Director for [place].  Dr. Ottie Glazier is Medical Director for [facility] Pulmonary Rehabilitation.

## 2022-07-20 ENCOUNTER — Encounter: Payer: No Typology Code available for payment source | Admitting: *Deleted

## 2022-07-20 DIAGNOSIS — R0602 Shortness of breath: Secondary | ICD-10-CM

## 2022-07-20 DIAGNOSIS — J449 Chronic obstructive pulmonary disease, unspecified: Secondary | ICD-10-CM | POA: Diagnosis not present

## 2022-07-20 NOTE — Progress Notes (Signed)
Daily Session Note  Patient Details  Name: Darius Norris MRN: [id number] Date of Birth: Sep 25, 1951 Referring Provider:   Flowsheet Row Pulmonary Rehab from 06/19/2022 in [facility] Cardiac and Pulmonary Rehab  Referring Provider Delorise Jackson MD  [Dr. Jenne Pane ([facility])]       Encounter Date: 07/20/2022  Check In:  Session Check In - 07/20/22 1136       Check-In   Supervising physician immediately available to respond to emergencies See telemetry face sheet for immediately available ER MD    Location [facility]-Cardiac & Pulmonary Rehab    Staff Present Heath Lark, RN, BSN, CCRP;Melissa [place], RDN, Melody Haver, BS, Exercise Physiologist;Meredith Sherryll Burger, RN BSN    Virtual Visit No    Medication changes reported     No    Fall or balance concerns reported    No    Warm-up and Cool-down Performed on first and last piece of equipment    Resistance Training Performed Yes    VAD Patient? No    PAD/SET Patient? No      Pain Assessment   Currently in Pain? No/denies                Social History   Tobacco Use  Smoking Status Former   Packs/day: 1.00   Years: 30.00   Total pack years: 30.00   Types: Cigarettes   Quit date: 12/18/2001   Years since quitting: 20.6  Smokeless Tobacco Never    Goals Met:  Independence with exercise equipment Exercise tolerated well No report of concerns or symptoms today  Goals Unmet:  Not Applicable  Comments: Pt able to follow exercise prescription today without complaint.  Will continue to monitor for progression.    Dr. Emily Filbert is Medical Director for [place].  Dr. Ottie Glazier is Medical Director for [facility] Pulmonary Rehabilitation.

## 2022-07-26 ENCOUNTER — Encounter: Payer: Self-pay | Admitting: *Deleted

## 2022-07-26 DIAGNOSIS — J449 Chronic obstructive pulmonary disease, unspecified: Secondary | ICD-10-CM

## 2022-07-26 NOTE — Progress Notes (Signed)
Pulmonary Individual Treatment Plan  Patient Details  Name: Darius Norris MRN: [id number] Date of Birth: [birth year] Referring Provider:   Flowsheet Row Pulmonary Rehab from 06/19/2022 in [facility] Cardiac and Pulmonary Rehab  Referring Provider Delorise Jackson MD  [Dr. Jenne Pane ([facility])]       Initial Encounter Date:  Flowsheet Row Pulmonary Rehab from 06/19/2022 in [facility] Cardiac and Pulmonary Rehab  Date 06/19/22       Visit Diagnosis: Chronic obstructive pulmonary disease, unspecified COPD type ([place])  Patient's Home Medications on Admission:  Current Outpatient Medications:    acetaminophen (TYLENOL) 325 MG tablet, Take 650 mg by mouth every 6 (six) hours as needed., Disp: , Rfl:    albuterol (VENTOLIN HFA) 108 (90 Base) MCG/ACT inhaler, Inhale into the lungs., Disp: , Rfl:    aspirin EC 81 MG EC tablet, Take 1 tablet (81 mg total) by mouth daily., Disp: 180 tablet, Rfl: 0   atorvastatin (LIPITOR) 80 MG tablet, Take 1 tablet (80 mg total) by mouth at bedtime., Disp: 30 tablet, Rfl: 0   budesonide-formoterol (SYMBICORT) 160-4.5 MCG/ACT inhaler, Inhale 2 puffs into the lungs 2 (two) times daily., Disp: , Rfl:    busPIRone (BUSPAR) 10 MG tablet, Take by mouth., Disp: , Rfl:    clobetasol (TEMOVATE) 0.05 % external solution, Apply topically., Disp: , Rfl:    dabigatran (PRADAXA) 150 MG CAPS capsule, Take 150 mg by mouth 2 (two) times daily., Disp: , Rfl:    diltiazem (CARDIZEM CD) 300 MG 24 hr capsule, Take 1 capsule (300 mg total) by mouth daily., Disp: 30 capsule, Rfl: 2   folic acid (FOLVITE) 1 MG tablet, Take 1 tablet (1 mg total) by mouth daily., Disp: 30 tablet, Rfl: 2   furosemide (LASIX) 40 MG tablet, Take 1 tablet (40 mg total) by mouth daily., Disp: 30 tablet, Rfl: 2   gabapentin (NEURONTIN) 300 MG capsule, Take 1 capsule by mouth 3 (three) times daily as needed., Disp: , Rfl:    guaifenesin (HUMIBID E) 400 MG TABS tablet, Take by mouth., Disp: , Rfl:    hydrocortisone  2.5 % cream, Apply topically., Disp: , Rfl:    hydrOXYzine (ATARAX/VISTARIL) 25 MG tablet, Take by mouth., Disp: , Rfl:    Ipratropium-Albuterol (COMBIVENT) 20-100 MCG/ACT AERS respimat, Inhale 1 puff into the lungs every 6 (six) hours as needed for wheezing or shortness of breath., Disp: , Rfl:    isosorbide mononitrate (IMDUR) 60 MG 24 hr tablet, Take 60 mg by mouth daily., Disp: , Rfl:    ketorolac (ACULAR) 0.5 % ophthalmic solution, INSTILL 1 DROP OPERATIVE EYE FOUR TIMES A DAY FOR POSTOPERATIVE PAIN/INFLAMMATION SEND TO 4B AMB SURGERY OR ON DAY OF SURGERY 04/05/22, Disp: , Rfl:    lidocaine (XYLOCAINE) 5 % ointment, Apply topically., Disp: , Rfl:    loratadine (CLARITIN) 10 MG tablet, Take 10 mg by mouth daily as needed for allergies. , Disp: , Rfl:    Melatonin 3 MG CAPS, Take 2 capsules by mouth at bedtime., Disp: , Rfl:    metFORMIN (GLUCOPHAGE-XR) 500 MG 24 hr tablet, Take 500 mg by mouth in the morning and at bedtime., Disp: , Rfl:    methotrexate (RHEUMATREX) 2.5 MG tablet, Take 10 mg by mouth once a week., Disp: , Rfl:    metoprolol tartrate (LOPRESSOR) 100 MG tablet, Take 1 tablet (100 mg total) by mouth 2 (two) times daily., Disp: 60 tablet, Rfl: 2   mometasone (ASMANEX) 220 MCG/INH inhaler, Take by mouth., Disp: ,  Rfl:    moxifloxacin (VIGAMOX) 0.5 % ophthalmic solution, INSTILL 1 DROP IN LEFT EYE FOUR TIMES A DAY FOR CATARACT SURGERY SEND TO 4B AMB SURGERY OR ON DAY OF SURGERY 04/05/22, Disp: , Rfl:    senna (SENOKOT) 8.6 MG TABS tablet, Take 1 tablet by mouth daily as needed for mild constipation., Disp: , Rfl:    simethicone (MYLICON) 80 MG chewable tablet, CHEW ONE TABLET BY MOUTH THREE TIMES A DAY AFTER MEALS AS NEEDED FOR GAS, Disp: , Rfl:   Past Medical History: Past Medical History:  Diagnosis Date   A-fib ([place])    CAD (coronary artery disease)    Colitis    COPD (chronic obstructive pulmonary disease) ([facility])    Emphysema lung ([facility])    GERD (gastroesophageal reflux  disease)    Hypertension    MI (myocardial infarction) ([place]) 1999    Tobacco Use: Social History   Tobacco Use  Smoking Status Former   Packs/day: 1.00   Years: 30.00   Total pack years: 30.00   Types: Cigarettes   Quit date: 12/18/2001   Years since quitting: 20.6  Smokeless Tobacco Never    Labs: Review Flowsheet  More data may exist      Latest Ref Rng & Units 02/04/2013 09/04/2016 05/02/2018 01/05/2019 12/06/2021  Labs for ITP Cardiac and Pulmonary Rehab  Cholestrol 0 - 200 mg/dL 185  116  - - 106   LDL (calc) 0 - 99 mg/dL 114  51  - - 51   HDL-C >40 mg/dL 38  42  - - 33   Trlycerides <150 mg/dL 166  116  - - 108   Hemoglobin A1c 4.8 - 5.6 % - 6.4  - - 5.9   Bicarbonate 20.0 - 28.0 mmol/L - - 36.3  29.6  -  O2 Saturation % - - - 81.7  -     Pulmonary Assessment Scores:  Pulmonary Assessment Scores     Row Name 06/19/22 1455         ADL UCSD   ADL Phase Entry     SOB Score total 49     Rest 0     Walk 2     Stairs 4     Bath 3     Dress 1     Shop 2       CAT Score   CAT Score 17       mMRC Score   mMRC Score 2              UCSD: Self-administered rating of dyspnea associated with activities of daily living (ADLs) 6-point scale (0 = "not at all" to 5 = "maximal or unable to do because of breathlessness")  Scoring Scores range from 0 to 120.  Minimally important difference is 5 units  CAT: CAT can identify the health impairment of COPD patients and is better correlated with disease progression.  CAT has a scoring range of zero to 40. The CAT score is classified into four groups of low (less than 10), medium (10 - 20), high (21-30) and very high (31-40) based on the impact level of disease on health status. A CAT score over 10 suggests significant symptoms.  A worsening CAT score could be explained by an exacerbation, poor medication adherence, poor inhaler technique, or progression of COPD or comorbid conditions.  CAT MCID is 2 points  mMRC: mMRC  (Modified Medical Research Council) Dyspnea Scale is used to assess the degree of baseline functional  disability in patients of respiratory disease due to dyspnea. No minimal important difference is established. A decrease in score of 1 point or greater is considered a positive change.   Pulmonary Function Assessment:  Pulmonary Function Assessment - 06/19/22 1455       Breath   Shortness of Breath Yes;Fear of Shortness of Breath;Limiting activity             Exercise Target Goals: Exercise Program Goal: Individual exercise prescription set using results from initial 6 min walk test and THRR while considering  patient's activity barriers and safety.   Exercise Prescription Goal: Initial exercise prescription builds to 30-45 minutes a day of aerobic activity, 2-3 days per week.  Home exercise guidelines will be given to patient during program as part of exercise prescription that the participant will acknowledge.  Education: Aerobic Exercise: - Group verbal and visual presentation on the components of exercise prescription. Introduces F.I.T.T principle from ACSM for exercise prescriptions.  Reviews F.I.T.T. principles of aerobic exercise including progression. Written material given at graduation. Flowsheet Row Pulmonary Rehab from 11/03/2021 in [facility] Cardiac and Pulmonary Rehab  Date 10/13/21  Educator [facility]  Instruction Review Code 1- Verbalizes Understanding       Education: Resistance Exercise: - Group verbal and visual presentation on the components of exercise prescription. Introduces F.I.T.T principle from ACSM for exercise prescriptions  Reviews F.I.T.T. principles of resistance exercise including progression. Written material given at graduation.    Education: Exercise & Equipment Safety: - Individual verbal instruction and demonstration of equipment use and safety with use of the equipment. Flowsheet Row Pulmonary Rehab from 06/19/2022 in [facility] Cardiac and Pulmonary Rehab   Date 05/08/22  Educator [facility]  Instruction Review Code 1- Verbalizes Understanding       Education: Exercise Physiology & General Exercise Guidelines: - Group verbal and written instruction with models to review the exercise physiology of the cardiovascular system and associated critical values. Provides general exercise guidelines with specific guidelines to those with heart or lung disease.  Flowsheet Row Pulmonary Rehab from 11/03/2021 in [facility] Cardiac and Pulmonary Rehab  Date 08/04/21  Educator [facility]  Instruction Review Code 1- Verbalizes Understanding       Education: Flexibility, Balance, Mind/Body Relaxation: - Group verbal and visual presentation with interactive activity on the components of exercise prescription. Introduces F.I.T.T principle from ACSM for exercise prescriptions. Reviews F.I.T.T. principles of flexibility and balance exercise training including progression. Also discusses the mind body connection.  Reviews various relaxation techniques to help reduce and manage stress (i.e. Deep breathing, progressive muscle relaxation, and visualization). Balance handout provided to take home. Written material given at graduation. Flowsheet Row Pulmonary Rehab from 11/03/2021 in [facility] Cardiac and Pulmonary Rehab  Date 10/27/21  Educator AS  Instruction Review Code 1- Verbalizes Understanding       Activity Barriers & Risk Stratification:  Activity Barriers & Cardiac Risk Stratification - 06/19/22 1445       Activity Barriers & Cardiac Risk Stratification   Activity Barriers Shortness of Breath;Deconditioning;Back Problems;Muscular Weakness;Balance Concerns;Assistive Device;Other (comment);Joint Problems    Comments neuropathy in feet, back and hip pain             6 Minute Walk:  6 Minute Walk     Row Name 06/19/22 1443         6 Minute Walk   Phase Initial     Distance 355 feet     Walk Time 3.03 minutes     # of Rest Breaks 2  25 sec, 17 sec, stopped at 3:44      MPH 1.33     METS 0.96     RPE 15     Perceived Dyspnea  2     VO2 Peak 3.38     Symptoms Yes (comment)     Comments hip pain 6/10, back pain 6/10, SOB, fatigue     Resting HR 62 bpm     Resting BP 146/64     Resting Oxygen Saturation  93 %     Exercise Oxygen Saturation  during 6 min walk 80 %     Max Ex. HR 74 bpm     Max Ex. BP 156/64     2 Minute Post BP 126/64       Interval HR   1 Minute HR 69     2 Minute HR 73     3 Minute HR 74     4 Minute HR 74  stopped at 3:44     2 Minute Post HR 60     Interval Heart Rate? Yes       Interval Oxygen   Interval Oxygen? Yes     Baseline Oxygen Saturation % 93 %  2L pulsed     1 Minute Oxygen Saturation % 89 %     1 Minute Liters of Oxygen 2 L  pulsed     2 Minute Oxygen Saturation % 83 %     2 Minute Liters of Oxygen 2 L     3 Minute Oxygen Saturation % 81 %     3 Minute Liters of Oxygen 3 L     4 Minute Oxygen Saturation % 80 %     4 Minute Liters of Oxygen 3 L     2 Minute Post Oxygen Saturation % 92 %     2 Minute Post Liters of Oxygen 3 L             Oxygen Initial Assessment:  Oxygen Initial Assessment - 07/04/22 1129       Home Oxygen   Home Oxygen Device Home Concentrator;E-Tanks    Sleep Oxygen Prescription Continuous    Liters per minute 3    Home Exercise Oxygen Prescription Pulsed    Liters per minute 3    Home Resting Oxygen Prescription Pulsed    Liters per minute 3    Compliance with Home Oxygen Use Yes      Program Oxygen Prescription   Program Oxygen Prescription Continuous;E-Tanks    Liters per minute 3      Intervention   Short Term Goals To learn and exhibit compliance with exercise, home and travel O2 prescription;To learn and understand importance of monitoring SPO2 with pulse oximeter and demonstrate accurate use of the pulse oximeter.;To learn and understand importance of maintaining oxygen saturations>88%;To learn and demonstrate proper pursed lip breathing techniques or other  breathing techniques. ;To learn and demonstrate proper use of respiratory medications    Long  Term Goals Exhibits compliance with exercise, home  and travel O2 prescription;Verbalizes importance of monitoring SPO2 with pulse oximeter and return demonstration;Maintenance of O2 saturations>88%;Exhibits proper breathing techniques, such as pursed lip breathing or other method taught during program session;Compliance with respiratory medication;Demonstrates proper use of MDI's             Oxygen Re-Evaluation:  Oxygen Re-Evaluation     Row Name 07/04/22 1129             Goals/Expected Outcomes  Comments [name] continues to use his oxygen as directed. He is recovering from double pneumonia and feels much better than before. He continues to watch his O2 and knows to notify his doctor of any abnormal changes. Retierated PLB. He is staying compliant with his medications and denies having any questions on how to use or take his respiratory medications.       Goals/Expected Outcomes Short: Continue to monitor O2 closely Long: Continue long-term compliance with PLB and symptom monitoring                Oxygen Discharge (Final Oxygen Re-Evaluation):  Oxygen Re-Evaluation - 07/04/22 1129       Goals/Expected Outcomes   Comments [name] continues to use his oxygen as directed. He is recovering from double pneumonia and feels much better than before. He continues to watch his O2 and knows to notify his doctor of any abnormal changes. Retierated PLB. He is staying compliant with his medications and denies having any questions on how to use or take his respiratory medications.    Goals/Expected Outcomes Short: Continue to monitor O2 closely Long: Continue long-term compliance with PLB and symptom monitoring             Initial Exercise Prescription:  Initial Exercise Prescription - 06/19/22 1400       Date of Initial Exercise RX and Referring Provider   Date 06/19/22    Referring Provider  Delorise Jackson MD   Dr. Jenne Pane ([facility])     Oxygen   Oxygen Continuous    Liters 3    Maintain Oxygen Saturation 88% or higher      Recumbant Bike   Level 1    RPM 50    Watts 10    Minutes 15    METs 1.5      NuStep   Level 1    SPM 80    Minutes 15    METs 1.5      REL-XR   Level 1    Speed 50    Minutes 15    METs 1.5      T5 Nustep   Level 1    SPM 80    Minutes 15    METs 1.5      Biostep-RELP   Level 1    SPM 50    Minutes 15    METs 2      Track   Laps 5    Minutes 15    METs 1.27      Prescription Details   Frequency (times per week) 2    Duration Progress to 30 minutes of continuous aerobic without signs/symptoms of physical distress      Intensity   THRR 40-80% of Max Heartrate 97-132    Ratings of Perceived Exertion 11-13    Perceived Dyspnea 0-4      Progression   Progression Continue to progress workloads to maintain intensity without signs/symptoms of physical distress.      Resistance Training   Training Prescription Yes    Weight 3 kb    Reps 10-15             Perform Capillary Blood Glucose checks as needed.  Exercise Prescription Changes:   Exercise Prescription Changes     Row Name 06/19/22 1400 06/28/22 1200 07/12/22 1300 07/18/22 1400       Response to Exercise   Blood Pressure (Admit) 146/64 116/64 128/64 --    Blood Pressure (Exercise) 156/64 138/60 134/82 --  Blood Pressure (Exit) 128/60 128/52 116/64 --    Heart Rate (Admit) 62 bpm 62 bpm 67 bpm --    Heart Rate (Exercise) 74 bpm 64 bpm 68 bpm --    Heart Rate (Exit) 60 bpm 61 bpm 62 bpm --    Oxygen Saturation (Admit) 93 % 95 % 90 % --    Oxygen Saturation (Exercise) 80 % 96 % 96 % --    Oxygen Saturation (Exit) 96 % 94 % 99 % --    Rating of Perceived Exertion (Exercise) '15 12 12 '$ --    Perceived Dyspnea (Exercise) 2 -- 2 --    Symptoms hip and back pain 6/10, SOB SOB SOB --    Comments walk test results second full day of exercise -- --     Duration -- Progress to 30 minutes of  aerobic without signs/symptoms of physical distress Continue with 30 min of aerobic exercise without signs/symptoms of physical distress. --    Intensity -- THRR unchanged THRR unchanged --      Progression   Progression -- Continue to progress workloads to maintain intensity without signs/symptoms of physical distress. Continue to progress workloads to maintain intensity without signs/symptoms of physical distress. --    Average METs -- 1.95 2 --      Resistance Training   Training Prescription -- Yes Yes --    Weight -- 4 lb 4 lb --    Reps -- 10-15 10-15 --      Interval Training   Interval Training -- No No --      Oxygen   Oxygen -- Continuous Continuous --    Liters -- 3 3 --      Treadmill   MPH -- 1 1.2 --    Grade -- 0 0 --    Minutes -- 15 15 --    METs -- 1.77 1.92 --      Recumbant Bike   Level -- -- 2 --    Minutes -- -- 15 --      NuStep   Level -- 4 -- --    Minutes -- 15 -- --    METs -- 2.1 -- --      REL-XR   Level -- 1 -- --    Minutes -- 15 -- --      Biostep-RELP   Level -- 1 -- --    Minutes -- 15 -- --    METs -- 2 -- --      Home Exercise Plan   Plans to continue exercise at -- -- -- Home (comment)  Treadmill for Walking; Hand Weights    Frequency -- -- -- Add 3 additional days to program exercise sessions.    Initial Home Exercises Provided -- -- -- 07/18/22      Oxygen   Maintain Oxygen Saturation -- 88% or higher 88% or higher 88% or higher             Exercise Comments:   Exercise Comments     Row Name 06/22/22 1046           Exercise Comments First full day of exercise!  Patient was oriented to gym and equipment including functions, settings, policies, and procedures.  Patient's individual exercise prescription and treatment plan were reviewed.  All starting workloads were established based on the results of the 6 minute walk test done at initial orientation visit.  The plan for  exercise progression was also introduced and progression will  be customized based on patient's performance and goals.                Exercise Goals and Review:   Exercise Goals     Row Name 06/19/22 1449             Exercise Goals   Increase Physical Activity Yes       Intervention Provide advice, education, support and counseling about physical activity/exercise needs.;Develop an individualized exercise prescription for aerobic and resistive training based on initial evaluation findings, risk stratification, comorbidities and participant's personal goals.       Expected Outcomes Short Term: Attend rehab on a regular basis to increase amount of physical activity.;Long Term: Add in home exercise to make exercise part of routine and to increase amount of physical activity.;Long Term: Exercising regularly at least 3-5 days a week.       Increase Strength and Stamina Yes       Intervention Provide advice, education, support and counseling about physical activity/exercise needs.;Develop an individualized exercise prescription for aerobic and resistive training based on initial evaluation findings, risk stratification, comorbidities and participant's personal goals.       Expected Outcomes Short Term: Increase workloads from initial exercise prescription for resistance, speed, and METs.;Short Term: Perform resistance training exercises routinely during rehab and add in resistance training at home;Long Term: Improve cardiorespiratory fitness, muscular endurance and strength as measured by increased METs and functional capacity (6MWT)       Able to understand and use rate of perceived exertion (RPE) scale Yes       Intervention Provide education and explanation on how to use RPE scale       Expected Outcomes Short Term: Able to use RPE daily in rehab to express subjective intensity level;Long Term:  Able to use RPE to guide intensity level when exercising independently       Able to understand  and use Dyspnea scale Yes       Intervention Provide education and explanation on how to use Dyspnea scale       Expected Outcomes Short Term: Able to use Dyspnea scale daily in rehab to express subjective sense of shortness of breath during exertion;Long Term: Able to use Dyspnea scale to guide intensity level when exercising independently       Knowledge and understanding of Target Heart Rate Range (THRR) Yes       Intervention Provide education and explanation of THRR including how the numbers were predicted and where they are located for reference       Expected Outcomes Short Term: Able to state/look up THRR;Short Term: Able to use daily as guideline for intensity in rehab;Long Term: Able to use THRR to govern intensity when exercising independently       Able to check pulse independently Yes       Intervention Provide education and demonstration on how to check pulse in carotid and radial arteries.;Review the importance of being able to check your own pulse for safety during independent exercise       Expected Outcomes Short Term: Able to explain why pulse checking is important during independent exercise;Long Term: Able to check pulse independently and accurately       Understanding of Exercise Prescription Yes       Intervention Provide education, explanation, and written materials on patient's individual exercise prescription       Expected Outcomes Short Term: Able to explain program exercise prescription;Long Term: Able to explain home exercise prescription to  exercise independently                Exercise Goals Re-Evaluation :  Exercise Goals Re-Evaluation     Row Name 06/22/22 1046 06/28/22 1241 07/04/22 1124 07/12/22 1322       Exercise Goal Re-Evaluation   Exercise Goals Review -- Increase Physical Activity;Increase Strength and Stamina;Understanding of Exercise Prescription Increase Physical Activity;Increase Strength and Stamina;Understanding of Exercise Prescription  Increase Physical Activity;Increase Strength and Stamina;Understanding of Exercise Prescription    Comments Reviewed RPE and dyspnea scales, THR and program prescription with pt today.  Pt voiced understanding and was given a copy of goals to take home. [name] has completed his first two full days of exercise again.  He was able to walk some.  We will conitnue to monitor his progress. [name] just started rehab not too long ago and has not done any home exercise yet. He has a treadmill at home that he plans to use. EP will go over home exercise with patient soon once he is further into the program. 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 We did review key points on home exercise: reviewing THR, monitoring HR & O2, PLB, monitoring intensity. [name] is off to a good start in rehab.  He is up to 1.2 mph on the treadmill and prefers treadmill to track.  He is also using 4 lb weights.  We will continue to montior his progress.    Expected Outcomes Short: Use RPE daily to regulate intensity. Long: Follow program prescription in THR. Short: Continue to monitor rehab regularly Long: Continue to follow program prescription Short: EP to go over home exercise Long: Exercise independently at home at appropriate prescription Short: Continue to increase workload on treadmill, try some incline Long: conitnue to improve stamina             Discharge Exercise Prescription (Final Exercise Prescription Changes):  Exercise Prescription Changes - 07/18/22 1400       Home Exercise Plan   Plans to continue exercise at Home (comment)   Treadmill for Walking; Hand Weights   Frequency Add 3 additional days to program exercise sessions.    Initial Home Exercises Provided 07/18/22      Oxygen   Maintain Oxygen Saturation 88% or higher             Nutrition:  Target Goals: Understanding of nutrition guidelines, daily intake of sodium '1500mg'$ , cholesterol '200mg'$ , calories 30% from fat and 7% or less from saturated  fats, daily to have 5 or more servings of fruits and vegetables.  Education: All About Nutrition: -Group instruction provided by verbal, written material, interactive activities, discussions, models, and posters to present general guidelines for heart healthy nutrition including fat, fiber, MyPlate, the role of sodium in heart healthy nutrition, utilization of the nutrition label, and utilization of this knowledge for meal planning. Follow up email sent as well. Written material given at graduation. Flowsheet Row Pulmonary Rehab from 06/19/2022 in [facility] Cardiac and Pulmonary Rehab  Education need identified 06/19/22       Biometrics:  Pre Biometrics - 06/19/22 1451       Pre Biometrics   Height 5' 5.6" (1.666 m)    Weight 184 lb 4.8 oz (83.6 kg)    BMI (Calculated) 30.12    Single Leg Stand 0 seconds              Nutrition Therapy Plan and Nutrition Goals:  Nutrition Therapy & Goals - 07/18/22 1120  Nutrition Therapy   RD appointment deferred Yes   [name] would not like to speak with RD at this time; will continue to follow up.     Personal Nutrition Goals   Nutrition Goal [name] would not like to speak with RD at this time; will continue to follow up.             Nutrition Assessments:  MEDIFICTS Score Key: ?70 Need to make dietary changes  40-70 Heart Healthy Diet ? 40 Therapeutic Level Cholesterol Diet  Flowsheet Row Pulmonary Rehab from 06/19/2022 in [facility] Cardiac and Pulmonary Rehab  Picture Your Plate Total Score on Admission 52      Picture Your Plate Scores: <46 Unhealthy dietary pattern with much room for improvement. 41-50 Dietary pattern unlikely to meet recommendations for good health and room for improvement. 51-60 More healthful dietary pattern, with some room for improvement.  >60 Healthy dietary pattern, although there may be some specific behaviors that could be improved.   Nutrition Goals Re-Evaluation:  Nutrition Goals Re-Evaluation      [place] Name 07/04/22 1159             Goals   Comment Patient has yet to meet with the RD at this time. He is not sure if he would like to make an appointment,       Expected Outcome Short: Schedule appt with RD if interested Long: Follow healthy pulmonary based diet                Nutrition Goals Discharge (Final Nutrition Goals Re-Evaluation):  Nutrition Goals Re-Evaluation - 07/04/22 1159       Goals   Comment Patient has yet to meet with the RD at this time. He is not sure if he would like to make an appointment,    Expected Outcome Short: Schedule appt with RD if interested Long: Follow healthy pulmonary based diet             Psychosocial: Target Goals: Acknowledge presence or absence of significant depression and/or stress, maximize coping skills, provide positive support system. Participant is able to verbalize types and ability to use techniques and skills needed for reducing stress and depression.   Education: Stress, Anxiety, and Depression - Group verbal and visual presentation to define topics covered.  Reviews how body is impacted by stress, anxiety, and depression.  Also discusses healthy ways to reduce stress and to treat/manage anxiety and depression.  Written material given at graduation. Flowsheet Row Pulmonary Rehab from 11/03/2021 in [facility] Cardiac and Pulmonary Rehab  Date 07/28/21  Educator [facility]  Instruction Review Code 1- [employer] Understanding       Education: Sleep Hygiene -Provides group verbal and written instruction about how sleep can affect your health.  Define sleep hygiene, discuss sleep cycles and impact of sleep habits. Review good sleep hygiene tips.    Initial Review & Psychosocial Screening:  Initial Psych Review & Screening - 05/08/22 1044       Initial Review   Current issues with Current Sleep Concerns;Current Psychotropic Meds;Current Depression;Current Anxiety/Panic      Family Dynamics   Good Support System? Yes     Comments [name] states that his health makes his life stressful. He has been having trouble sleeping. He takes medication for his depression and anxiety.He can look to his wife and his two sisters for support.      Barriers   Psychosocial barriers to participate in program There are no identifiable barriers or psychosocial needs.;The patient  should benefit from training in stress management and relaxation.      Screening Interventions   Interventions Encouraged to exercise;To provide support and resources with identified psychosocial needs;Provide feedback about the scores to participant    Expected Outcomes Short Term goal: Utilizing psychosocial counselor, staff and physician to assist with identification of specific Stressors or current issues interfering with healing process. Setting desired goal for each stressor or current issue identified.;Long Term Goal: Stressors or current issues are controlled or eliminated.;Short Term goal: Identification and review with participant of any Quality of Life or Depression concerns found by scoring the questionnaire.;Long Term goal: The participant improves quality of Life and PHQ9 Scores as seen by post scores and/or verbalization of changes             Quality of Life Scores:  Scores of 19 and below usually indicate a poorer quality of life in these areas.  A difference of  2-3 points is a clinically meaningful difference.  A difference of 2-3 points in the total score of the Quality of Life Index has been associated with significant improvement in overall quality of life, self-image, physical symptoms, and general health in studies assessing change in quality of life.  PHQ-9: Review Flowsheet  More data exists      07/18/2022 06/19/2022 11/17/2021 06/13/2021 02/26/2019  Depression screen PHQ 2/9  Decreased Interest '1 2 1 '$ 0 1  Down, Depressed, Hopeless 0 1 0 0 0  PHQ - 2 Score '1 3 1 '$ 0 1  Altered sleeping '3 2 2 '$ 0 2  Tired, decreased energy '2 2 1 1 2   '$ Change in appetite 0 0 1 0 1  Feeling bad or failure about yourself  0 0 0 0 0  Trouble concentrating 0 0 0 0 0  Moving slowly or fidgety/restless 0 0 0 0 0  Suicidal thoughts 0 0 0 0 0  PHQ-9 Score '6 7 5 1 6  '$ Difficult doing work/chores Somewhat difficult Not difficult at all Somewhat difficult - Somewhat difficult   Interpretation of Total Score  Total Score Depression Severity:  1-4 = Minimal depression, 5-9 = Mild depression, 10-14 = Moderate depression, 15-19 = Moderately severe depression, 20-27 = Severe depression   Psychosocial Evaluation and Intervention:  Psychosocial Evaluation - 05/08/22 1046       Psychosocial Evaluation & Interventions   Interventions Encouraged to exercise with the program and follow exercise prescription;Stress management education;Relaxation education    Comments [name] states that his health makes his life stressful. He has been having trouble sleeping. He takes medication for his depression and anxiety.He can look to his wife and his two sisters for support.    Expected Outcomes Short: Exercise regularly to support mental health and notify staff of any changes. Long: maintain mental health and well being through teaching of rehab or prescribed medications independently.    Continue Psychosocial Services  Follow up required by staff             Psychosocial Re-Evaluation:  Psychosocial Re-Evaluation     [place] Name 07/04/22 1131             Psychosocial Re-Evaluation   Current issues with Current Sleep Concerns;Current Stress Concerns       Comments [name] is just getting starting in rehab. He states his sleep is not great as he has trouble falling asleep, however, it has always been like this for a while. He takes melatonin to help but not too interested in taking something  else. We talked about trying guided meditations and relaxation before bed. Discussed calm. com that has variation of meditation and deep breathing. He has some stress, but  feels he tolerates it well. He is staying compliant with his medications and feels they are helping.       Expected Outcomes Short: Work on different relaxation method before bed Long: Maintain posititve attitude       Interventions Encouraged to attend Pulmonary Rehabilitation for the exercise       Continue Psychosocial Services  Follow up required by staff                Psychosocial Discharge (Final Psychosocial Re-Evaluation):  Psychosocial Re-Evaluation - 07/04/22 1131       Psychosocial Re-Evaluation   Current issues with Current Sleep Concerns;Current Stress Concerns    Comments [name] is just getting starting in rehab. He states his sleep is not great as he has trouble falling asleep, however, it has always been like this for a while. He takes melatonin to help but not too interested in taking something else. We talked about trying guided meditations and relaxation before bed. Discussed calm. com that has variation of meditation and deep breathing. He has some stress, but feels he tolerates it well. He is staying compliant with his medications and feels they are helping.    Expected Outcomes Short: Work on different relaxation method before bed Long: Maintain posititve attitude    Interventions Encouraged to attend Pulmonary Rehabilitation for the exercise    Continue Psychosocial Services  Follow up required by staff             Education: Education Goals: Education classes will be provided on a weekly basis, covering required topics. Participant will state understanding/return demonstration of topics presented.  Learning Barriers/Preferences:  Learning Barriers/Preferences - 05/08/22 1042       Learning Barriers/Preferences   Learning Barriers None    Learning Preferences Group Instruction;Individual Instruction;Pictoral;Skilled Demonstration;Verbal Instruction;Video;Written Material             General Pulmonary Education Topics:  Infection Prevention: -  Provides verbal and written material to individual with discussion of infection control including proper hand washing and proper equipment cleaning during exercise session. Flowsheet Row Pulmonary Rehab from 06/19/2022 in [facility] Cardiac and Pulmonary Rehab  Date 05/08/22  Educator [facility]  Instruction Review Code 1- Verbalizes Understanding       Falls Prevention: - Provides verbal and written material to individual with discussion of falls prevention and safety. Flowsheet Row Pulmonary Rehab from 06/19/2022 in [facility] Cardiac and Pulmonary Rehab  Date 05/08/22  Educator [facility]  Instruction Review Code 1- Verbalizes Understanding       Chronic Lung Disease Review: - Group verbal instruction with posters, models, PowerPoint presentations and videos,  to review new updates, new respiratory medications, new advancements in procedures and treatments. Providing information on websites and "800" numbers for continued self-education. Includes information about supplement oxygen, available portable oxygen systems, continuous and intermittent flow rates, oxygen safety, concentrators, and Medicare reimbursement for oxygen. Explanation of Pulmonary Drugs, including class, frequency, complications, importance of spacers, rinsing mouth after steroid MDI's, and proper cleaning methods for nebulizers. Review of basic lung anatomy and physiology related to function, structure, and complications of lung disease. Review of risk factors. Discussion about methods for diagnosing sleep apnea and types of masks and machines for OSA. Includes a review of the use of types of environmental controls: home humidity, furnaces, filters, dust mite/pet prevention, HEPA vacuums. Discussion  about weather changes, air quality and the benefits of nasal washing. Instruction on Warning signs, infection symptoms, calling MD promptly, preventive modes, and value of vaccinations. Review of effective airway clearance, coughing and/or vibration techniques.  Emphasizing that all should Create an Action Plan. Written material given at graduation. Flowsheet Row Pulmonary Rehab from 11/03/2021 in [facility] Cardiac and Pulmonary Rehab  Date 09/22/21  Educator [facility]  Instruction Review Code 1- Verbalizes Understanding       AED/CPR: - Group verbal and written instruction with the use of models to demonstrate the basic use of the AED with the basic ABC's of resuscitation. Flowsheet Row Pulmonary Rehab from 02/26/2019 in [facility] Cardiac and Pulmonary Rehab  Date 01/01/19  Educator [facility]  Instruction Review Code 1- Verbalizes Understanding        Anatomy and Cardiac Procedures: - Group verbal and visual presentation and models provide information about basic cardiac anatomy and function. Reviews the testing methods done to diagnose heart disease and the outcomes of the test results. Describes the treatment choices: Medical Management, Angioplasty, or Coronary Bypass Surgery for treating various heart conditions including Myocardial Infarction, Angina, Valve Disease, and Cardiac Arrhythmias.  Written material given at graduation.   Medication Safety: - Group verbal and visual instruction to review commonly prescribed medications for heart and lung disease. Reviews the medication, class of the drug, and side effects. Includes the steps to properly store meds and maintain the prescription regimen.  Written material given at graduation. Flowsheet Row Pulmonary Rehab from 11/03/2021 in [facility] Cardiac and Pulmonary Rehab  Date 09/01/21  Educator [facility]  Instruction Review Code 1- Verbalizes Understanding       Other: -Provides group and verbal instruction on various topics (see comments)   Knowledge Questionnaire Score:  Knowledge Questionnaire Score - 06/19/22 1452       Knowledge Questionnaire Score   Pre Score 17/18              Core Components/Risk Factors/Patient Goals at Admission:  Personal Goals and Risk Factors at Admission - 06/19/22 1452        Core Components/Risk Factors/Patient Goals on Admission    Weight Management Yes;Weight Loss;Obesity    Intervention Weight Management: Develop a combined nutrition and exercise program designed to reach desired caloric intake, while maintaining appropriate intake of nutrient and fiber, sodium and fats, and appropriate energy expenditure required for the weight goal.;Weight Management: Provide education and appropriate resources to help participant work on and attain dietary goals.;Weight Management/Obesity: Establish reasonable short term and long term weight goals.;Obesity: Provide education and appropriate resources to help participant work on and attain dietary goals.    Admit Weight 184 lb 4.8 oz (83.6 kg)    Goal Weight: Short Term 180 lb (81.6 kg)    Goal Weight: Long Term 180 lb (81.6 kg)    Expected Outcomes Short Term: Continue to assess and modify interventions until short term weight is achieved;Long Term: Adherence to nutrition and physical activity/exercise program aimed toward attainment of established weight goal;Weight Loss: Understanding of general recommendations for a balanced deficit meal plan, which promotes 1-2 lb weight loss per week and includes a negative energy balance of [phone] kcal/d;Understanding of distribution of calorie intake throughout the day with the consumption of 4-5 meals/snacks;Understanding recommendations for meals to include 15-35% energy as protein, 25-35% energy from fat, 35-60% energy from carbohydrates, less than '200mg'$  of dietary cholesterol, 20-35 gm of total fiber daily    Intervention Provide education, individualized exercise plan and daily activity instruction  to help decrease symptoms of SOB with activities of daily living.    Expected Outcomes Short Term: Improve cardiorespiratory fitness to achieve a reduction of symptoms when performing ADLs;Long Term: Be able to perform more ADLs without symptoms or delay the onset of symptoms    Increase  knowledge of respiratory medications and ability to use respiratory devices properly  Yes    Intervention Provide education and demonstration as needed of appropriate use of medications, inhalers, and oxygen therapy.    Expected Outcomes Short Term: Achieves understanding of medications use. Understands that oxygen is a medication prescribed by physician. Demonstrates appropriate use of inhaler and oxygen therapy.;Long Term: Maintain appropriate use of medications, inhalers, and oxygen therapy.    Diabetes Yes    Intervention Provide education about signs/symptoms and action to take for hypo/hyperglycemia.;Provide education about proper nutrition, including hydration, and aerobic/resistive exercise prescription along with prescribed medications to achieve blood glucose in normal ranges: Fasting glucose 65-99 mg/dL    Expected Outcomes Short Term: Participant verbalizes understanding of the signs/symptoms and immediate care of hyper/hypoglycemia, proper foot care and importance of medication, aerobic/resistive exercise and nutrition plan for blood glucose control.;Long Term: Attainment of HbA1C < 7%.    Hypertension Yes    Intervention Provide education on lifestyle modifcations including regular physical activity/exercise, weight management, moderate sodium restriction and increased consumption of fresh fruit, vegetables, and low fat dairy, alcohol moderation, and smoking cessation.;Monitor prescription use compliance.    Expected Outcomes Short Term: Continued assessment and intervention until BP is < 140/8m HG in hypertensive participants. < 130/842mHG in hypertensive participants with diabetes, heart failure or chronic kidney disease.;Long Term: Maintenance of blood pressure at goal levels.    Lipids Yes    Intervention Provide education and support for participant on nutrition & aerobic/resistive exercise along with prescribed medications to achieve LDL '70mg'$ , HDL >'40mg'$ .    Expected Outcomes Short  Term: Participant states understanding of desired cholesterol values and is compliant with medications prescribed. Participant is following exercise prescription and nutrition guidelines.;Long Term: Cholesterol controlled with medications as prescribed, with individualized exercise RX and with personalized nutrition plan. Value goals: LDL < '70mg'$ , HDL > 40 mg.             Education:Diabetes - Individual verbal and written instruction to review signs/symptoms of diabetes, desired ranges of glucose level fasting, after meals and with exercise. Acknowledge that pre and post exercise glucose checks will be done for 3 sessions at entry of program. [facility]  Date 05/08/22  [facility]- Verbalizes Understanding       Know Your Numbers and Heart Failure: - Group verbal and visual instruction to discuss disease risk factors for cardiac and pulmonary disease and treatment options.  Reviews associated critical values for Overweight/Obesity, Hypertension, Cholesterol, and Diabetes.  Discusses basics of heart failure: signs/symptoms and treatments.  Introduces Heart Failure Zone chart for action plan for heart failure.  Written material given at graduation. Flow[facility]  Date 07/14/21  Educator [facility]  Instruction Review Code 1- Verbalizes Understanding       Core Components/Risk Factors/Patient Goals Review:   Goals and Risk Factor Review     Row Name 07/04/22 1126             Core Components/Risk Factors/Patient Goals Review   Personal Goals Review Weight Management/Obesity;Diabetes;Hypertension;Improve shortness of breath with ADL's  Review [name] just started the program not too long ago. His doctor recently changed his BP medications about 3 weeks ago and has been feeling slightly dizzy from it. He reports BP stays stable  and now low. Told him to let them know as soon as possible just in case they need to make changes. He checks his blood sugars once/ week as what he was advised by the doctor and states it is always under 130.Marland Kitchen He has lost 30 lbs since last year which was intentional for the most part. He is looking to lose more, just reiterated to make sure he is not losing muscle mass. He stopped drinking regular soda and switched to diet which he thinks made a huge difference. He stays compliant with medications       Expected Outcomes Short: Monitor BP closely and talk to doctor about dizziness Long: Continue to manage lifestyle risk factors                Core Components/Risk Factors/Patient Goals at Discharge (Final Review):   Goals and Risk Factor Review - 07/04/22 1126       Core Components/Risk Factors/Patient Goals Review   Personal Goals Review Weight Management/Obesity;Diabetes;Hypertension;Improve shortness of breath with ADL's    Review [name] just started the program not too long ago. His doctor recently changed his BP medications about 3 weeks ago and has been feeling slightly dizzy from it. He reports BP stays stable and now low. Told him to let them know as soon as possible just in case they need to make changes. He checks his blood sugars once/ week as what he was advised by the doctor and states it is always under 130.Marland Kitchen He has lost 30 lbs since last year which was intentional for the most part. He is looking to lose more, just reiterated to make sure he is not losing muscle mass. He stopped drinking regular soda and switched to diet which he thinks made a huge difference. He stays compliant with medications    Expected Outcomes Short: Monitor BP closely and talk to doctor about dizziness Long: Continue to manage lifestyle risk factors             ITP Comments:  ITP Comments     Row Name 05/08/22 1041 06/19/22 1443 06/22/22 1045 06/28/22 1024 07/26/22 0804   ITP Comments Virtual Visit  completed. Patient informed on EP and RD appointment and 6 Minute walk test. Patient also informed of patient health questionnaires on My Chart. Patient Verbalizes understanding. Visit diagnosis can be found in [facility], patient is [place]. Completed 6MWT and gym orientation. Initial ITP created and sent for review to Dr. Zetta Bills, Medical Director. First full day of exercise!  Patient was oriented to gym and equipment including functions, settings, policies, and procedures.  Patient's individual exercise prescription and treatment plan were reviewed.  All starting workloads were established based on the results of the 6 minute walk test done at initial orientation visit.  The plan for exercise progression was also introduced and progression will be customized based on patient's performance and goals. 30 Day review completed. Medical Director ITP review done, changes made as directed, and signed approval by Medical Director.   NEW 30 Day review completed. Medical Director ITP review done, changes made as directed, and signed approval by Medical Director.            Comments:

## 2022-07-27 ENCOUNTER — Encounter: Payer: No Typology Code available for payment source | Admitting: *Deleted

## 2022-07-27 DIAGNOSIS — J449 Chronic obstructive pulmonary disease, unspecified: Secondary | ICD-10-CM | POA: Diagnosis not present

## 2022-07-27 DIAGNOSIS — R0602 Shortness of breath: Secondary | ICD-10-CM

## 2022-07-27 NOTE — Progress Notes (Signed)
Daily Session Note  Patient Details  Name: KOLBIE LEPKOWSKI MRN: [id number] Date of Birth: Dec 16, 1951 Referring Provider:   Flowsheet Row Pulmonary Rehab from 06/19/2022 in [facility] Cardiac and Pulmonary Rehab  Referring Provider Delorise Jackson MD  [Dr. Jenne Pane ([facility])]       Encounter Date: 07/27/2022  Check In:  Session Check In - 07/27/22 1241       Check-In   Supervising physician immediately available to respond to emergencies See telemetry face sheet for immediately available ER MD    Location [facility]-Cardiac & Pulmonary Rehab    Staff Present Nyoka Cowden, RN, BSN, Ardeth Sportsman, RDN, LDN;Jessica [place], MA, RCEP, CCRP, [place], MS, ASCM CEP, Exercise Physiologist    Virtual Visit No    Medication changes reported     No    Fall or balance concerns reported    No    Tobacco Cessation No Change    Warm-up and Cool-down Performed on first and last piece of equipment    Resistance Training Performed Yes    VAD Patient? No    PAD/SET Patient? No      Pain Assessment   Currently in Pain? No/denies                Social History   Tobacco Use  Smoking Status Former   Packs/day: 1.00   Years: 30.00   Total pack years: 30.00   Types: Cigarettes   Quit date: 12/18/2001   Years since quitting: 20.6  Smokeless Tobacco Never    Goals Met:  Independence with exercise equipment Exercise tolerated well No report of concerns or symptoms today  Goals Unmet:  Not Applicable  Comments: Pt able to follow exercise prescription today without complaint.  Will continue to monitor for progression.    Dr. Emily Filbert is Medical Director for [place].  Dr. Ottie Glazier is Medical Director for [facility] Pulmonary Rehabilitation.

## 2022-08-01 ENCOUNTER — Encounter: Payer: No Typology Code available for payment source | Admitting: *Deleted

## 2022-08-01 DIAGNOSIS — R0602 Shortness of breath: Secondary | ICD-10-CM

## 2022-08-01 DIAGNOSIS — J449 Chronic obstructive pulmonary disease, unspecified: Secondary | ICD-10-CM | POA: Diagnosis not present

## 2022-08-01 NOTE — Progress Notes (Signed)
Daily Session Note  Patient Details  Name: Darius Norris MRN: [id number] Date of Birth: [birth year] Referring Provider:   Flowsheet Row Pulmonary Rehab from 06/19/2022 in [facility] Cardiac and Pulmonary Rehab  Referring Provider Delorise Jackson MD  [Dr. Jenne Pane ([facility])]       Encounter Date: 08/01/2022  Check In:  Session Check In - 08/01/22 1134       Check-In   Supervising physician immediately available to respond to emergencies See telemetry face sheet for immediately available ER MD    Location [facility]-Cardiac & Pulmonary Rehab    Staff Present Heath Lark, RN, BSN, CCRP;Jessica [place], MA, RCEP, CCRP, Festus, BS, ACSM CEP, Exercise Physiologist    Virtual Visit No    Medication changes reported     No    Fall or balance concerns reported    No    Warm-up and Cool-down Performed on first and last piece of equipment    Resistance Training Performed Yes    VAD Patient? No    PAD/SET Patient? No      Pain Assessment   Currently in Pain? No/denies                Social History   Tobacco Use  Smoking Status Former   Packs/day: 1.00   Years: 30.00   Total pack years: 30.00   Types: Cigarettes   Quit date: 12/18/2001   Years since quitting: 20.6  Smokeless Tobacco Never    Goals Met:  Proper associated with RPD/PD & O2 Sat Independence with exercise equipment Exercise tolerated well No report of concerns or symptoms today  Goals Unmet:  Not Applicable  Comments: Pt able to follow exercise prescription today without complaint.  Will continue to monitor for progression.    Dr. Emily Filbert is Medical Director for [place].  Dr. Ottie Glazier is Medical Director for [facility] Pulmonary Rehabilitation.

## 2022-08-03 ENCOUNTER — Encounter: Payer: No Typology Code available for payment source | Admitting: *Deleted

## 2022-08-03 DIAGNOSIS — J449 Chronic obstructive pulmonary disease, unspecified: Secondary | ICD-10-CM

## 2022-08-03 DIAGNOSIS — R0602 Shortness of breath: Secondary | ICD-10-CM

## 2022-08-03 NOTE — Progress Notes (Signed)
Daily Session Note  Patient Details  Name: SCHON ZEIDERS MRN: [id number] Date of Birth: [birth year] Referring Provider:   Flowsheet Row Pulmonary Rehab from 06/19/2022 in [facility] Cardiac and Pulmonary Rehab  Referring Provider Delorise Jackson MD  [Dr. Jenne Pane ([facility])]       Encounter Date: 08/03/2022  Check In:  Session Check In - 08/03/22 1109       Check-In   Supervising physician immediately available to respond to emergencies See telemetry face sheet for immediately available ER MD    Location [facility]-Cardiac & Pulmonary Rehab    Staff Present Heath Lark, RN, BSN, Jacklynn Bue, MS, ASCM CEP, Exercise Physiologist;Melissa Tilford Pillar, RDN, LDN;Meredith Sherryll Burger, RN BSN    Virtual Visit No    Medication changes reported     No    Fall or balance concerns reported    No    Warm-up and Cool-down Performed on first and last piece of equipment    Resistance Training Performed Yes    VAD Patient? No    PAD/SET Patient? No      Pain Assessment   Currently in Pain? No/denies                Social History   Tobacco Use  Smoking Status Former   Packs/day: 1.00   Years: 30.00   Total pack years: 30.00   Types: Cigarettes   Quit date: 12/18/2001   Years since quitting: 20.6  Smokeless Tobacco Never    Goals Met:  Proper associated with RPD/PD & O2 Sat Independence with exercise equipment Exercise tolerated well No report of concerns or symptoms today  Goals Unmet:  Not Applicable  Comments: Pt able to follow exercise prescription today without complaint.  Will continue to monitor for progression.    Dr. Emily Filbert is Medical Director for [place].  Dr. Ottie Glazier is Medical Director for [facility] Pulmonary Rehabilitation.

## 2022-08-08 ENCOUNTER — Encounter: Payer: No Typology Code available for payment source | Admitting: *Deleted

## 2022-08-08 DIAGNOSIS — J449 Chronic obstructive pulmonary disease, unspecified: Secondary | ICD-10-CM

## 2022-08-08 NOTE — Progress Notes (Signed)
Daily Session Note  Patient Details  Name: Darius Norris MRN: [id number] Date of Birth: Jun 17, 1951 Referring Provider:   Flowsheet Row Pulmonary Rehab from 06/19/2022 in [facility] Cardiac and Pulmonary Rehab  Referring Provider Delorise Jackson MD  [Dr. Jenne Pane (PCP[facility])]       Encounter Date: 08/08/2022  Check In:  Session Check In - 08/08/22 1146       Check-In   Supervising physician immediately available to respond to emergencies See telemetry face sheet for immediately available ER MD    Location [facility]-Cardiac & Pulmonary Rehab    Staff Present Heath Lark, RN, BSN, Jacklynn Bue, MS, ASCM CEP, Exercise Physiologist;Kelly Amedeo Plenty, BS, ACSM CEP, Exercise Physiologist    Medication changes reported     No    Fall or balance concerns reported    No    Warm-up and Cool-down Performed on first and last piece of equipment    Resistance Training Performed Yes    VAD Patient? No    PAD/SET Patient? No      Pain Assessment   Currently in Pain? No/denies                Social History   Tobacco Use  Smoking Status Former   Packs/day: 1.00   Years: 30.00   Total pack years: 30.00   Types: Cigarettes   Quit date: 12/18/2001   Years since quitting: 20.6  Smokeless Tobacco Never    Goals Met:  Proper associated with RPD/PD & O2 Sat Independence with exercise equipment Exercise tolerated well No report of concerns or symptoms today  Goals Unmet:  Not Applicable  Comments: Pt able to follow exercise prescription today without complaint.  Will continue to monitor for progression.    Dr. Emily Filbert is Medical Director for [place].  Dr. Ottie Glazier is Medical Director for [facility] Pulmonary Rehabilitation.

## 2022-08-10 ENCOUNTER — Encounter: Payer: No Typology Code available for payment source | Admitting: *Deleted

## 2022-08-10 DIAGNOSIS — J449 Chronic obstructive pulmonary disease, unspecified: Secondary | ICD-10-CM

## 2022-08-10 NOTE — Progress Notes (Signed)
Daily Session Note  Patient Details  Name: Darius Norris MRN: [id number] Date of Birth: [birth year] Referring Provider:   Flowsheet Row Pulmonary Rehab from 06/19/2022 in [facility] Cardiac and Pulmonary Rehab  Referring Provider Delorise Jackson MD  [Dr. Jenne Pane ([facility])]       Encounter Date: 08/10/2022  Check In:  Session Check In - 08/10/22 1102       Check-In   Supervising physician immediately available to respond to emergencies See telemetry face sheet for immediately available ER MD    Location [facility]-Cardiac & Pulmonary Rehab    Staff Present Hope Budds, RDN, LDN;Joseph Jackson, RCP,RRT,BSRT;Adreyan Carbajal [place], RN BSN;Noah Tickle, BS, Exercise Physiologist    Virtual Visit No    Medication changes reported     No    Fall or balance concerns reported    No    Warm-up and Cool-down Performed on first and last piece of equipment    Resistance Training Performed Yes    VAD Patient? No    PAD/SET Patient? No      Pain Assessment   Currently in Pain? No/denies                Social History   Tobacco Use  Smoking Status Former   Packs/day: 1.00   Years: 30.00   Total pack years: 30.00   Types: Cigarettes   Quit date: 12/18/2001   Years since quitting: 20.6  Smokeless Tobacco Never    Goals Met:  Independence with exercise equipment Exercise tolerated well No report of concerns or symptoms today Strength training completed today  Goals Unmet:  Not Applicable  Comments: Pt able to follow exercise prescription today without complaint.  Will continue to monitor for progression.    Dr. Emily Filbert is Medical Director for [place].  Dr. Ottie Glazier is Medical Director for [facility] Pulmonary Rehabilitation.

## 2022-08-16 ENCOUNTER — Encounter: Payer: Self-pay | Admitting: Emergency Medicine

## 2022-08-16 ENCOUNTER — Emergency Department: Payer: Medicare HMO

## 2022-08-16 DIAGNOSIS — Z7982 Long term (current) use of aspirin: Secondary | ICD-10-CM | POA: Diagnosis not present

## 2022-08-16 DIAGNOSIS — I10 Essential (primary) hypertension: Secondary | ICD-10-CM | POA: Diagnosis not present

## 2022-08-16 DIAGNOSIS — I2510 Atherosclerotic heart disease of native coronary artery without angina pectoris: Secondary | ICD-10-CM | POA: Insufficient documentation

## 2022-08-16 DIAGNOSIS — Z79899 Other long term (current) drug therapy: Secondary | ICD-10-CM | POA: Insufficient documentation

## 2022-08-16 DIAGNOSIS — E119 Type 2 diabetes mellitus without complications: Secondary | ICD-10-CM | POA: Insufficient documentation

## 2022-08-16 DIAGNOSIS — R6883 Chills (without fever): Secondary | ICD-10-CM | POA: Insufficient documentation

## 2022-08-16 DIAGNOSIS — Z20822 Contact with and (suspected) exposure to covid-19: Secondary | ICD-10-CM | POA: Diagnosis not present

## 2022-08-16 DIAGNOSIS — Z7984 Long term (current) use of oral hypoglycemic drugs: Secondary | ICD-10-CM | POA: Insufficient documentation

## 2022-08-16 DIAGNOSIS — J449 Chronic obstructive pulmonary disease, unspecified: Secondary | ICD-10-CM | POA: Insufficient documentation

## 2022-08-16 DIAGNOSIS — Z7951 Long term (current) use of inhaled steroids: Secondary | ICD-10-CM | POA: Insufficient documentation

## 2022-08-16 LAB — CBC WITH DIFFERENTIAL/PLATELET
Abs Immature Granulocytes: 0.05 10*3/uL (ref 0.00–0.07)
Basophils Absolute: 0.1 10*3/uL (ref 0.0–0.1)
Basophils Relative: 1 %
Eosinophils Absolute: 0.2 10*3/uL (ref 0.0–0.5)
Eosinophils Relative: 2 %
HCT: 38.2 % — ABNORMAL LOW (ref 39.0–52.0)
Hemoglobin: 11.8 g/dL — ABNORMAL LOW (ref 13.0–17.0)
Immature Granulocytes: 1 %
Lymphocytes Relative: 19 %
Lymphs Abs: 1.8 10*3/uL (ref 0.7–4.0)
MCH: 29.1 pg (ref 26.0–34.0)
MCHC: 30.9 g/dL (ref 30.0–36.0)
MCV: 94.3 fL (ref 80.0–100.0)
Monocytes Absolute: 0.4 10*3/uL (ref 0.1–1.0)
Monocytes Relative: 4 %
Neutro Abs: 6.7 10*3/uL (ref 1.7–7.7)
Neutrophils Relative %: 73 %
Platelets: 271 10*3/uL (ref 150–400)
RBC: 4.05 MIL/uL — ABNORMAL LOW (ref 4.22–5.81)
RDW: 16.1 % — ABNORMAL HIGH (ref 11.5–15.5)
WBC: 9.1 10*3/uL (ref 4.0–10.5)
nRBC: 0 % (ref 0.0–0.2)

## 2022-08-16 NOTE — ED Triage Notes (Signed)
Pt presents via POV with complaints of chills and low temp (95.4-96.5) at home when checking his oral temp. Pt denies consuming any beverages before taking his temp. Hx of emphysema, COPD, CHF - wears 3L chronically. Denies Cough, CP or SOB.

## 2022-08-17 ENCOUNTER — Emergency Department
Admission: EM | Admit: 2022-08-17 | Discharge: 2022-08-17 | Disposition: A | Discharge status: Home / Self Care | Payer: Medicare HMO | Attending: Emergency Medicine | Admitting: Emergency Medicine

## 2022-08-17 DIAGNOSIS — R6883 Chills (without fever): Secondary | ICD-10-CM

## 2022-08-17 LAB — COMPREHENSIVE METABOLIC PANEL
ALT: 30 U/L (ref 0–44)
AST: 22 U/L (ref 15–41)
Albumin: 3.9 g/dL (ref 3.5–5.0)
Alkaline Phosphatase: 86 U/L (ref 38–126)
Anion gap: 10 (ref 5–15)
BUN: 15 mg/dL (ref 8–23)
CO2: 28 mmol/L (ref 22–32)
Calcium: 8.7 mg/dL — ABNORMAL LOW (ref 8.9–10.3)
Chloride: 103 mmol/L (ref 98–111)
Creatinine, Ser: 1.02 mg/dL (ref 0.61–1.24)
GFR, Estimated: >60 mL/min (ref >60)
Glucose, Bld: 171 mg/dL — ABNORMAL HIGH (ref 70–99)
Potassium: 3.5 mmol/L (ref 3.5–5.1)
Sodium: 141 mmol/L (ref 135–145)
Total Bilirubin: 0.7 mg/dL (ref 0.3–1.2)
Total Protein: 6.6 g/dL (ref 6.5–8.1)

## 2022-08-17 LAB — URINALYSIS, ROUTINE W REFLEX MICROSCOPIC
Bilirubin Urine: NEGATIVE
Glucose, UA: NEGATIVE mg/dL
Hgb urine dipstick: NEGATIVE
Ketones, ur: NEGATIVE mg/dL
Leukocytes,Ua: NEGATIVE
Nitrite: NEGATIVE
Protein, ur: NEGATIVE mg/dL
Specific Gravity, Urine: 1.011 (ref 1.005–1.030)
pH: 5 (ref 5.0–8.0)

## 2022-08-17 LAB — SARS CORONAVIRUS 2 BY RT PCR: SARS Coronavirus 2 by RT PCR: NEGATIVE

## 2022-08-17 LAB — LACTIC ACID, PLASMA: Lactic Acid, Venous: 1 mmol/L (ref 0.5–1.9)

## 2022-08-17 LAB — PROCALCITONIN: Procalcitonin: <0.1 ng/mL

## 2022-08-17 MED ORDER — DOXYCYCLINE HYCLATE 100 MG PO CAPS: 100.0000 mg | ORAL_CAPSULE | Freq: Two times a day (BID) | ORAL | 0 refills | Status: DC

## 2022-08-17 NOTE — Discharge Instructions (Signed)
Your lab work was very reassuring today.  Given your complaints of sinus pain and congestion along with an x-ray that showed possible early developing pneumonia, we are discharging you on antibiotics.  If you do begin having shortness of breath, chest pain, vomiting or diarrhea that do not stop, continue to have fevers, chills or low body temperature, please return to the emergency department.

## 2022-08-17 NOTE — ED Provider Notes (Signed)
[facility] Provider Note    Event Date/Time   First MD Initiated Contact with Patient 08/17/22 [phone]     (approximate)   History   Chills   HPI  Darius Norris is a 71 y.o. male with history of CAD, COPD on chronic oxygen, hypertension, hyperlipidemia, diabetes, atrial fibrillation who presents to the emergency department with chills tonight.  He states he took his temperature at home several times with an oral thermometer and it was 95.  He states that he keeps his air conditioning between 72 to 74 degrees and that his wife was also in the house and she felt fine.  He states he is feeling better since being here in the emergency department.  He denies any headache, neck pain or neck stiffness, chest pain, shortness of breath, cough, vomiting, diarrhea, abdominal pain, dysuria, rash.  States he has had some mild congestion and sinus pressure and feels like he has a sinus infection.   History provided by patient and wife.    Past Medical History:  Diagnosis Date   A-fib [facility])    CAD (coronary artery disease)    Colitis    COPD (chronic obstructive pulmonary disease) (HCC)    Emphysema lung (HCC)    GERD (gastroesophageal reflux disease)    Hypertension    MI (myocardial infarction) ([place]) 1999    Past Surgical History:  Procedure Laterality Date   ABDOMINAL HERNIA REPAIR Bilateral    CHOLECYSTECTOMY     COLONOSCOPY WITH PROPOFOL N/A 02/01/2018   Procedure: COLONOSCOPY WITH PROPOFOL;  Surgeon: Jonathon Bellows, MD;  Location: [facility] ENDOSCOPY;  Service: Gastroenterology;  Laterality: N/A;   KNEE ARTHROSCOPY Bilateral     MEDICATIONS:  Prior to Admission medications   Medication Sig Start Date End Date Taking? Authorizing Provider  acetaminophen (TYLENOL) 325 MG tablet Take 650 mg by mouth every 6 (six) hours as needed.    [provider]  albuterol (VENTOLIN HFA) 108 (90 Base) MCG/ACT inhaler Inhale into the lungs. 03/30/21   [provider]  aspirin EC 81 MG EC tablet Take 1 tablet (81 mg total) by mouth daily. 06/09/18   [name], Avel Peace, MD  atorvastatin (LIPITOR) 80 MG tablet Take 1 tablet (80 mg total) by mouth at bedtime. 11/18/17   Vaughan Basta, MD  budesonide-formoterol (SYMBICORT) 160-4.5 MCG/ACT inhaler Inhale 2 puffs into the lungs 2 (two) times daily.    [provider]  busPIRone (BUSPAR) 10 MG tablet Take by mouth.    [provider]  clobetasol (TEMOVATE) 0.05 % external solution Apply topically. 02/28/21   [provider]  dabigatran (PRADAXA) 150 MG CAPS capsule Take 150 mg by mouth 2 (two) times daily.    [provider]  diltiazem (CARDIZEM CD) 300 MG 24 hr capsule Take 1 capsule (300 mg total) by mouth daily. 05/24/22 08/22/22  Enzo Bi, MD  folic acid (FOLVITE) 1 MG tablet Take 1 tablet (1 mg total) by mouth daily. 05/24/22 08/22/22  Enzo Bi, MD  furosemide (LASIX) 40 MG tablet Take 1 tablet (40 mg total) by mouth daily. 05/24/22 08/22/22  Enzo Bi, MD  gabapentin (NEURONTIN) 300 MG capsule Take 1 capsule by mouth 3 (three) times daily as needed. 10/10/21   [provider]  guaifenesin (HUMIBID E) 400 MG TABS tablet Take by mouth. 03/30/21   [provider]  hydrocortisone 2.5 % cream Apply topically. 02/28/21   [provider]  hydrOXYzine (ATARAX/VISTARIL) 25 MG tablet Take by  mouth.    [provider]  Ipratropium-Albuterol (COMBIVENT) 20-100 MCG/ACT AERS respimat Inhale 1 puff into the lungs every 6 (six) hours as needed for wheezing or shortness of breath.    [provider]  isosorbide mononitrate (IMDUR) 60 MG 24 hr tablet Take 60 mg by mouth daily.    [provider]  ketorolac (ACULAR) 0.5 % ophthalmic solution INSTILL 1 DROP OPERATIVE EYE FOUR TIMES A DAY FOR POSTOPERATIVE PAIN/INFLAMMATION SEND TO 4B AMB SURGERY OR ON DAY OF SURGERY 04/05/22 03/29/22   [provider]  lidocaine (XYLOCAINE) 5 %  ointment Apply topically. 03/30/21   [provider]  loratadine (CLARITIN) 10 MG tablet Take 10 mg by mouth daily as needed for allergies.     [provider]  Melatonin 3 MG CAPS Take 2 capsules by mouth at bedtime. 10/10/21   [provider]  metFORMIN (GLUCOPHAGE-XR) 500 MG 24 hr tablet Take 500 mg by mouth in the morning and at bedtime. 03/30/21   [provider]  methotrexate (RHEUMATREX) 2.5 MG tablet Take 10 mg by mouth once a week. 03/02/21   [provider]  metoprolol tartrate (LOPRESSOR) 100 MG tablet Take 1 tablet (100 mg total) by mouth 2 (two) times daily. 05/24/22 08/22/22  Enzo Bi, MD  mometasone [facility]) 220 MCG/INH inhaler Take by mouth. 03/10/21   [provider]  moxifloxacin (VIGAMOX) 0.5 % ophthalmic solution INSTILL 1 DROP IN LEFT EYE FOUR TIMES A DAY FOR CATARACT SURGERY SEND TO 4B AMB SURGERY OR ON DAY OF SURGERY 04/05/22 03/29/22   [provider]  senna (SENOKOT) 8.6 MG TABS tablet Take 1 tablet by mouth daily as needed for mild constipation.    [provider]  simethicone (MYLICON) 80 MG chewable tablet CHEW ONE TABLET BY MOUTH THREE TIMES A DAY AFTER MEALS AS NEEDED FOR GAS 03/04/22   [provider]    Physical Exam   Triage Vital Signs: ED Triage Vitals  Enc Vitals Group     BP 08/16/22 2316 (!) 156/66     Pulse Rate 08/16/22 2316 70     Resp 08/16/22 2316 16     Temp 08/16/22 2316 98.5 F (36.9 C)     Temp Source 08/16/22 2316 Oral     SpO2 08/16/22 2316 96 %     Weight 08/16/22 2317 180 lb (81.6 kg)     Height 08/16/22 2317 '5\' 6"'$  (1.676 m)     Head Circumference --      Peak Flow --      Pain Score --      Pain Loc --      Pain Edu? --      Excl. in [place]? --     Most recent vital signs: Vitals:   08/17/22 0223 08/17/22 0528  BP: 122/73 (!) 165/58  Pulse: (!) 53 64  Resp: 18 20  Temp: 97.8 F (36.6 C) 98 F (36.7 C)  SpO2: 100% 100%    CONSTITUTIONAL: Alert and  oriented and responds appropriately to questions.  Chronically ill-appearing but afebrile in no hypothermia.  Nontoxic.  Well-hydrated. HEAD: Normocephalic, atraumatic EYES: Conjunctivae clear, pupils appear equal, sclera nonicteric ENT: normal nose; moist mucous membranes NECK: Supple, normal ROM CARD: RRR; S1 and S2 appreciated; no murmurs, no clicks, no rubs, no gallops RESP: Normal chest excursion without splinting or tachypnea; breath sounds clear and equal bilaterally; no wheezes, no rhonchi, no rales, no hypoxia or respiratory distress, speaking full sentences ABD/GI: Normal  bowel sounds; non-distended; soft, non-tender, no rebound, no guarding, no peritoneal signs BACK: The back appears normal EXT: Normal ROM in all joints; no deformity noted, no edema; no cyanosis SKIN: Normal color for age and race; warm; no rash on exposed skin NEURO: Moves all extremities equally, normal speech PSYCH: The patient's mood and manner are appropriate.   ED Results / Procedures / Treatments   LABS: (all labs ordered are listed, but only abnormal results are displayed) Labs Reviewed  CBC WITH DIFFERENTIAL/PLATELET - Abnormal; Notable for the following components:      Result Value   RBC 4.05 (*)    Hemoglobin 11.8 (*)    HCT 38.2 (*)    RDW 16.1 (*)    All other components within normal limits  COMPREHENSIVE METABOLIC PANEL - Abnormal; Notable for the following components:   Glucose, Bld 171 (*)    Calcium 8.7 (*)    All other components within normal limits  URINALYSIS, ROUTINE W REFLEX MICROSCOPIC - Abnormal; Notable for the following components:   Color, Urine YELLOW (*)    APPearance CLEAR (*)    All other components within normal limits  SARS CORONAVIRUS 2 BY RT PCR  LACTIC ACID, PLASMA  PROCALCITONIN     EKG:  RADIOLOGY: My personal review and interpretation of imaging: Chest x-ray shows possible atelectasis versus infiltrate in the left lower lung greater than the right.  I  have personally reviewed all radiology reports.   DG Chest 2 View  Result Date: 08/17/2022 CLINICAL DATA:  Chills and low temperature. EXAM: CHEST - 2 VIEW COMPARISON:  May 19, 2022 FINDINGS: The heart size and mediastinal contours are within normal limits. Marked severity emphysematous lung disease is seen. Mild areas of atelectasis and/or infiltrate are noted within the bilateral lung bases, left greater than right. This is decreased in severity when compared to the prior study. There is no evidence of a pleural effusion or pneumothorax. The visualized skeletal structures are unremarkable. IMPRESSION: COPD with bibasilar atelectasis and/or infiltrate, left greater than right. Electronically Signed   By: Virgina Norfolk M.D.   On: 08/17/2022 00:08     PROCEDURES:  Critical Care performed: No    Procedures    IMPRESSION / MDM / ASSESSMENT AND PLAN / ED COURSE  I reviewed the triage vital signs and the nursing notes.    Patient here with chills that have resolved.  Had low oral temperature with a thermometer at home of 95.  Normal body temperatures here.     DIFFERENTIAL DIAGNOSIS (includes but not limited to):   Sepsis, bacteremia, UTI, pneumonia, COVID, malfunctioning thermometer   Patient's presentation is most consistent with acute presentation with potential threat to life or bodily function.   PLAN: CBC, CMP, procalcitonin, lactic, COVID, urine, chest x-ray obtained from triage.  No leukocytosis or leukopenia.  Normal electrolytes and renal function.  Normal LFTs.  Procalcitonin negative.  Lactic normal.  Urine shows no sign of infection or dehydration.  COVID-negative.  Chest x-ray showing possible atelectasis versus infiltrate.  He denies any cough, chest pain or shortness of breath.  He does report report having some congestion however and sinus pressure and suspects that he has a sinus infection.  We discussed the possibility that he has a viral illness but given his  comorbidities and possible infiltrate on chest x-ray, we have discussed risk and benefits of antibiotics.  He would prefer to go home with antibiotics which I feel is reasonable.  We will start him  on doxycycline.  He has no hypoxia here on his normal nasal cannula.  He states he is feeling well and his body temperature has been normal.  I feel he is safe for discharge and him and his wife are also comfortable with this plan.  He seems very reliable and will return if there is any change or any other concern.   MEDICATIONS GIVEN IN ED: Medications - No data to display   ED COURSE:  At this time, I do not feel there is any life-threatening condition present. I reviewed all nursing notes, vitals, pertinent previous records.  All lab and urine results, EKGs, imaging ordered have been independently reviewed and interpreted by myself.  I reviewed all available radiology reports from any imaging ordered this visit.  Based on my assessment, I feel the patient is safe to be discharged home without further emergent workup and can continue workup as an outpatient as needed. Discussed all findings, treatment plan as well as usual and customary return precautions.  They verbalize understanding and are comfortable with this plan.  Outpatient follow-up has been provided as needed.  All questions have been answered.    CONSULTS: Admission considered but given reassuring work-up, patient is appropriate for further outpatient management.   OUTSIDE RECORDS REVIEWED: Reviewed [facility].       FINAL CLINICAL IMPRESSION(S) / ED DIAGNOSES   Final diagnoses:  Chills     Rx / DC Orders   ED Discharge Orders          Ordered    doxycycline (VIBRAMYCIN) 100 MG capsule  2 times daily        08/17/22 8828             Note:  This document was prepared using Dragon voice recognition software and may include unintentional dictation errors.   Brunilda Eble, Delice Bison, DO 08/17/22 [phone]

## 2022-08-22 ENCOUNTER — Encounter: Payer: No Typology Code available for payment source | Attending: Anesthesiology | Admitting: *Deleted

## 2022-08-22 DIAGNOSIS — J449 Chronic obstructive pulmonary disease, unspecified: Secondary | ICD-10-CM | POA: Diagnosis present

## 2022-08-22 DIAGNOSIS — R0602 Shortness of breath: Secondary | ICD-10-CM | POA: Insufficient documentation

## 2022-08-22 NOTE — Progress Notes (Signed)
Daily Session Note  Patient Details  Name: Darius Norris MRN: [id number] Date of Birth: 1951-04-20 Referring Provider:   Flowsheet Row Pulmonary Rehab from 06/19/2022 in [facility] Cardiac and Pulmonary Rehab  Referring Provider Delorise Jackson MD  [Dr. Jenne Pane ([facility])]       Encounter Date: 08/22/2022  Check In:  Session Check In - 08/22/22 1136       Check-In   Supervising physician immediately available to respond to emergencies See telemetry face sheet for immediately available ER MD    Location [facility]-Cardiac & Pulmonary Rehab    Staff Present Nyoka Cowden, RN, BSN, MA;Meredith Sherryll Burger, RN BSN;Noah Tickle, BS, Exercise Physiologist;Jessica Luan Pulling, MA, RCEP, CCRP, CCET    Virtual Visit No    Medication changes reported     Yes    Comments On 10 day course of Doxycycline for "slight" pneumonia per Xray. Started last Wednesday.    Fall or balance concerns reported    No    Tobacco Cessation No Change    Warm-up and Cool-down Performed on first and last piece of equipment    Resistance Training Performed Yes    VAD Patient? No    PAD/SET Patient? No      Pain Assessment   Currently in Pain? No/denies                Social History   Tobacco Use  Smoking Status Former   Packs/day: 1.00   Years: 30.00   Total pack years: 30.00   Types: Cigarettes   Quit date: 12/18/2001   Years since quitting: 20.6  Smokeless Tobacco Never    Goals Met:  Independence with exercise equipment Exercise tolerated well No report of concerns or symptoms today  Goals Unmet:  Not Applicable  Comments: Pt able to follow exercise prescription today without complaint.  Will continue to monitor for progression.    Dr. Emily Filbert is Medical Director for [place].  Dr. Ottie Glazier is Medical Director for [facility] Pulmonary Rehabilitation.

## 2022-08-23 ENCOUNTER — Encounter: Payer: Self-pay | Admitting: *Deleted

## 2022-08-23 DIAGNOSIS — J449 Chronic obstructive pulmonary disease, unspecified: Secondary | ICD-10-CM

## 2022-08-23 NOTE — Progress Notes (Signed)
Pulmonary Individual Treatment Plan  Patient Details  Name: Darius Norris MRN: [id number] Date of Birth: Jul 10, 1951 Referring Provider:   Flowsheet Row Pulmonary Rehab from 06/19/2022 in [facility] Cardiac and Pulmonary Rehab  Referring Provider Delorise Jackson MD  [Dr. Jenne Pane ([facility])]       Initial Encounter Date:  Flowsheet Row Pulmonary Rehab from 06/19/2022 in [facility] Cardiac and Pulmonary Rehab  Date 06/19/22       Visit Diagnosis: Chronic obstructive pulmonary disease, unspecified COPD type ([place])  Patient's Home Medications on Admission:  Current Outpatient Medications:    acetaminophen (TYLENOL) 325 MG tablet, Take 650 mg by mouth every 6 (six) hours as needed., Disp: , Rfl:    albuterol (VENTOLIN HFA) 108 (90 Base) MCG/ACT inhaler, Inhale into the lungs., Disp: , Rfl:    aspirin EC 81 MG EC tablet, Take 1 tablet (81 mg total) by mouth daily., Disp: 180 tablet, Rfl: 0   atorvastatin (LIPITOR) 80 MG tablet, Take 1 tablet (80 mg total) by mouth at bedtime., Disp: 30 tablet, Rfl: 0   budesonide-formoterol (SYMBICORT) 160-4.5 MCG/ACT inhaler, Inhale 2 puffs into the lungs 2 (two) times daily., Disp: , Rfl:    busPIRone (BUSPAR) 10 MG tablet, Take by mouth., Disp: , Rfl:    clobetasol (TEMOVATE) 0.05 % external solution, Apply topically., Disp: , Rfl:    dabigatran (PRADAXA) 150 MG CAPS capsule, Take 150 mg by mouth 2 (two) times daily., Disp: , Rfl:    diltiazem (CARDIZEM CD) 300 MG 24 hr capsule, Take 1 capsule (300 mg total) by mouth daily., Disp: 30 capsule, Rfl: 2   doxycycline (VIBRAMYCIN) 100 MG capsule, Take 1 capsule (100 mg total) by mouth 2 (two) times daily., Disp: 14 capsule, Rfl: 0   furosemide (LASIX) 40 MG tablet, Take 1 tablet (40 mg total) by mouth daily., Disp: 30 tablet, Rfl: 2   gabapentin (NEURONTIN) 300 MG capsule, Take 1 capsule by mouth 3 (three) times daily as needed., Disp: , Rfl:    guaifenesin (HUMIBID E) 400 MG TABS tablet, Take by mouth., Disp: ,  Rfl:    hydrocortisone 2.5 % cream, Apply topically., Disp: , Rfl:    hydrOXYzine (ATARAX/VISTARIL) 25 MG tablet, Take by mouth., Disp: , Rfl:    Ipratropium-Albuterol (COMBIVENT) 20-100 MCG/ACT AERS respimat, Inhale 1 puff into the lungs every 6 (six) hours as needed for wheezing or shortness of breath., Disp: , Rfl:    isosorbide mononitrate (IMDUR) 60 MG 24 hr tablet, Take 60 mg by mouth daily., Disp: , Rfl:    ketorolac (ACULAR) 0.5 % ophthalmic solution, INSTILL 1 DROP OPERATIVE EYE FOUR TIMES A DAY FOR POSTOPERATIVE PAIN/INFLAMMATION SEND TO 4B AMB SURGERY OR ON DAY OF SURGERY 04/05/22, Disp: , Rfl:    lidocaine (XYLOCAINE) 5 % ointment, Apply topically., Disp: , Rfl:    loratadine (CLARITIN) 10 MG tablet, Take 10 mg by mouth daily as needed for allergies. , Disp: , Rfl:    Melatonin 3 MG CAPS, Take 2 capsules by mouth at bedtime., Disp: , Rfl:    metFORMIN (GLUCOPHAGE-XR) 500 MG 24 hr tablet, Take 500 mg by mouth in the morning and at bedtime., Disp: , Rfl:    methotrexate (RHEUMATREX) 2.5 MG tablet, Take 10 mg by mouth once a week., Disp: , Rfl:    metoprolol tartrate (LOPRESSOR) 100 MG tablet, Take 1 tablet (100 mg total) by mouth 2 (two) times daily., Disp: 60 tablet, Rfl: 2   mometasone (ASMANEX) 220 MCG/INH inhaler, Take by mouth.,  Disp: , Rfl:    moxifloxacin (VIGAMOX) 0.5 % ophthalmic solution, INSTILL 1 DROP IN LEFT EYE FOUR TIMES A DAY FOR CATARACT SURGERY SEND TO 4B AMB SURGERY OR ON DAY OF SURGERY 04/05/22, Disp: , Rfl:    senna (SENOKOT) 8.6 MG TABS tablet, Take 1 tablet by mouth daily as needed for mild constipation., Disp: , Rfl:    simethicone (MYLICON) 80 MG chewable tablet, CHEW ONE TABLET BY MOUTH THREE TIMES A DAY AFTER MEALS AS NEEDED FOR GAS, Disp: , Rfl:   Past Medical History: Past Medical History:  Diagnosis Date   A-fib ([place])    CAD (coronary artery disease)    Colitis    COPD (chronic obstructive pulmonary disease) (HCC)    Emphysema lung (HCC)    GERD  (gastroesophageal reflux disease)    Hypertension    MI (myocardial infarction) ([place]) 1999    Tobacco Use: Social History   Tobacco Use  Smoking Status Former   Packs/day: 1.00   Years: 30.00   Total pack years: 30.00   Types: Cigarettes   Quit date: 12/18/2001   Years since quitting: 20.6  Smokeless Tobacco Never    Labs: Review Flowsheet  More data may exist      Latest Ref Rng & Units 02/04/2013 09/04/2016 05/02/2018 01/05/2019 12/06/2021  Labs for ITP Cardiac and Pulmonary Rehab  Cholestrol 0 - 200 mg/dL 185  116  - - 106   LDL (calc) 0 - 99 mg/dL 114  51  - - 51   HDL-C >40 mg/dL 38  42  - - 33   Trlycerides <150 mg/dL 166  116  - - 108   Hemoglobin A1c 4.8 - 5.6 % - 6.4  - - 5.9   Bicarbonate 20.0 - 28.0 mmol/L - - 36.3  29.6  -  O2 Saturation % - - - 81.7  -     Pulmonary Assessment Scores:  Pulmonary Assessment Scores     Row Name 06/19/22 1455         ADL UCSD   ADL Phase Entry     SOB Score total 49     Rest 0     Walk 2     Stairs 4     Bath 3     Dress 1     Shop 2       CAT Score   CAT Score 17       mMRC Score   mMRC Score 2              UCSD: Self-administered rating of dyspnea associated with activities of daily living (ADLs) 6-point scale (0 = "not at all" to 5 = "maximal or unable to do because of breathlessness")  Scoring Scores range from 0 to 120.  Minimally important difference is 5 units  CAT: CAT can identify the health impairment of COPD patients and is better correlated with disease progression.  CAT has a scoring range of zero to 40. The CAT score is classified into four groups of low (less than 10), medium (10 - 20), high (21-30) and very high (31-40) based on the impact level of disease on health status. A CAT score over 10 suggests significant symptoms.  A worsening CAT score could be explained by an exacerbation, poor medication adherence, poor inhaler technique, or progression of COPD or comorbid conditions.  CAT MCID is  2 points  mMRC: mMRC (Modified Medical Research Council) Dyspnea Scale is used to assess the degree of  baseline functional disability in patients of respiratory disease due to dyspnea. No minimal important difference is established. A decrease in score of 1 point or greater is considered a positive change.   Pulmonary Function Assessment:  Pulmonary Function Assessment - 06/19/22 1455       Breath   Shortness of Breath Yes;Fear of Shortness of Breath;Limiting activity             Exercise Target Goals: Exercise Program Goal: Individual exercise prescription set using results from initial 6 min walk test and THRR while considering  patient's activity barriers and safety.   Exercise Prescription Goal: Initial exercise prescription builds to 30-45 minutes a day of aerobic activity, 2-3 days per week.  Home exercise guidelines will be given to patient during program as part of exercise prescription that the participant will acknowledge.  Education: Aerobic Exercise: - Group verbal and visual presentation on the components of exercise prescription. Introduces F.I.T.T principle from ACSM for exercise prescriptions.  Reviews F.I.T.T. principles of aerobic exercise including progression. Written material given at graduation. Flowsheet Row Pulmonary Rehab from 11/03/2021 in [facility] Cardiac and Pulmonary Rehab  Date 10/13/21  Educator [facility]  Instruction Review Code 1- Verbalizes Understanding       Education: Resistance Exercise: - Group verbal and visual presentation on the components of exercise prescription. Introduces F.I.T.T principle from ACSM for exercise prescriptions  Reviews F.I.T.T. principles of resistance exercise including progression. Written material given at graduation.    Education: Exercise & Equipment Safety: - Individual verbal instruction and demonstration of equipment use and safety with use of the equipment. Flowsheet Row Pulmonary Rehab from 07/27/2022 in [facility]  Cardiac and Pulmonary Rehab  Date 05/08/22  Educator [facility]  Instruction Review Code 1- Verbalizes Understanding       Education: Exercise Physiology & General Exercise Guidelines: - Group verbal and written instruction with models to review the exercise physiology of the cardiovascular system and associated critical values. Provides general exercise guidelines with specific guidelines to those with heart or lung disease.  Flowsheet Row Pulmonary Rehab from 11/03/2021 in [facility] Cardiac and Pulmonary Rehab  Date 08/04/21  Educator [facility]  Instruction Review Code 1- Verbalizes Understanding       Education: Flexibility, Balance, Mind/Body Relaxation: - Group verbal and visual presentation with interactive activity on the components of exercise prescription. Introduces F.I.T.T principle from ACSM for exercise prescriptions. Reviews F.I.T.T. principles of flexibility and balance exercise training including progression. Also discusses the mind body connection.  Reviews various relaxation techniques to help reduce and manage stress (i.e. Deep breathing, progressive muscle relaxation, and visualization). Balance handout provided to take home. Written material given at graduation. Flowsheet Row Pulmonary Rehab from 11/03/2021 in [facility] Cardiac and Pulmonary Rehab  Date 10/27/21  Educator AS  Instruction Review Code 1- Verbalizes Understanding       Activity Barriers & Risk Stratification:  Activity Barriers & Cardiac Risk Stratification - 06/19/22 1445       Activity Barriers & Cardiac Risk Stratification   Activity Barriers Shortness of Breath;Deconditioning;Back Problems;Muscular Weakness;Balance Concerns;Assistive Device;Other (comment);Joint Problems    Comments neuropathy in feet, back and hip pain             6 Minute Walk:  6 Minute Walk     Row Name 06/19/22 1443         6 Minute Walk   Phase Initial     Distance 355 feet     Walk Time 3.03 minutes     # of Rest  Breaks 2  25  sec, 17 sec, stopped at 3:44     MPH 1.33     METS 0.96     RPE 15     Perceived Dyspnea  2     VO2 Peak 3.38     Symptoms Yes (comment)     Comments hip pain 6/10, back pain 6/10, SOB, fatigue     Resting HR 62 bpm     Resting BP 146/64     Resting Oxygen Saturation  93 %     Exercise Oxygen Saturation  during 6 min walk 80 %     Max Ex. HR 74 bpm     Max Ex. BP 156/64     2 Minute Post BP 126/64       Interval HR   1 Minute HR 69     2 Minute HR 73     3 Minute HR 74     4 Minute HR 74  stopped at 3:44     2 Minute Post HR 60     Interval Heart Rate? Yes       Interval Oxygen   Interval Oxygen? Yes     Baseline Oxygen Saturation % 93 %  2L pulsed     1 Minute Oxygen Saturation % 89 %     1 Minute Liters of Oxygen 2 L  pulsed     2 Minute Oxygen Saturation % 83 %     2 Minute Liters of Oxygen 2 L     3 Minute Oxygen Saturation % 81 %     3 Minute Liters of Oxygen 3 L     4 Minute Oxygen Saturation % 80 %     4 Minute Liters of Oxygen 3 L     2 Minute Post Oxygen Saturation % 92 %     2 Minute Post Liters of Oxygen 3 L             Oxygen Initial Assessment:  Oxygen Initial Assessment - 07/04/22 1129       Home Oxygen   Home Oxygen Device Home Concentrator;E-Tanks    Sleep Oxygen Prescription Continuous    Liters per minute 3    Home Exercise Oxygen Prescription Pulsed    Liters per minute 3    Home Resting Oxygen Prescription Pulsed    Liters per minute 3    Compliance with Home Oxygen Use Yes      Program Oxygen Prescription   Program Oxygen Prescription Continuous;E-Tanks    Liters per minute 3      Intervention   Short Term Goals To learn and exhibit compliance with exercise, home and travel O2 prescription;To learn and understand importance of monitoring SPO2 with pulse oximeter and demonstrate accurate use of the pulse oximeter.;To learn and understand importance of maintaining oxygen saturations>88%;To learn and demonstrate proper pursed lip  breathing techniques or other breathing techniques. ;To learn and demonstrate proper use of respiratory medications    Long  Term Goals Exhibits compliance with exercise, home  and travel O2 prescription;Verbalizes importance of monitoring SPO2 with pulse oximeter and return demonstration;Maintenance of O2 saturations>88%;Exhibits proper breathing techniques, such as pursed lip breathing or other method taught during program session;Compliance with respiratory medication;Demonstrates proper use of MDI's             Oxygen Re-Evaluation:  Oxygen Re-Evaluation     Row Name 07/04/22 1129 08/01/22 1124           Program  Oxygen Prescription   Program Oxygen Prescription -- Continuous;E-Tanks      Liters per minute -- 3        Home Oxygen   Home Oxygen Device -- Home Concentrator;E-Tanks      Sleep Oxygen Prescription -- Continuous      Liters per minute -- 3      Home Exercise Oxygen Prescription -- Continuous      Liters per minute -- 3      Home Resting Oxygen Prescription -- Continuous      Liters per minute -- 3      Compliance with Home Oxygen Use -- Yes        Goals/Expected Outcomes   Short Term Goals -- To learn and exhibit compliance with exercise, home and travel O2 prescription;To learn and understand importance of monitoring SPO2 with pulse oximeter and demonstrate accurate use of the pulse oximeter.;To learn and understand importance of maintaining oxygen saturations>88%;To learn and demonstrate proper pursed lip breathing techniques or other breathing techniques. ;To learn and demonstrate proper use of respiratory medications      Long  Term Goals -- Exhibits compliance with exercise, home  and travel O2 prescription;Verbalizes importance of monitoring SPO2 with pulse oximeter and return demonstration;Maintenance of O2 saturations>88%;Exhibits proper breathing techniques, such as pursed lip breathing or other method taught during program session;Compliance with respiratory  medication;Demonstrates proper use of MDI's      Comments Darius Norris continues to use his oxygen as directed. He is recovering from double pneumonia and feels much better than before. He continues to watch his O2 and knows to notify his doctor of any abnormal changes. Retierated PLB. He is staying compliant with his medications and denies having any questions on how to use or take his respiratory medications. Darius Norris is doing well with his oxygen therapy.  He had a visit from oxygen company today as they checked up on his equipment.  They said all was good.  He continues to use his PLB and nose strips to help with air flow.  His saturations have continued to do well when he checks at home.      Goals/Expected Outcomes Short: Continue to monitor O2 closely Long: Continue long-term compliance with PLB and symptom monitoring Short; Continue to use PLB to help with breathing Long: conitnued compliance               Oxygen Discharge (Final Oxygen Re-Evaluation):  Oxygen Re-Evaluation - 08/01/22 1124       Program Oxygen Prescription   Program Oxygen Prescription Continuous;E-Tanks    Liters per minute 3      Home Oxygen   Home Oxygen Device Home Concentrator;E-Tanks    Sleep Oxygen Prescription Continuous    Liters per minute 3    Home Exercise Oxygen Prescription Continuous    Liters per minute 3    Home Resting Oxygen Prescription Continuous    Liters per minute 3    Compliance with Home Oxygen Use Yes      Goals/Expected Outcomes   Short Term Goals To learn and exhibit compliance with exercise, home and travel O2 prescription;To learn and understand importance of monitoring SPO2 with pulse oximeter and demonstrate accurate use of the pulse oximeter.;To learn and understand importance of maintaining oxygen saturations>88%;To learn and demonstrate proper pursed lip breathing techniques or other breathing techniques. ;To learn and demonstrate proper use of respiratory medications    Long  Term Goals  Exhibits compliance with exercise, home  and travel O2 prescription;Verbalizes  importance of monitoring SPO2 with pulse oximeter and return demonstration;Maintenance of O2 saturations>88%;Exhibits proper breathing techniques, such as pursed lip breathing or other method taught during program session;Compliance with respiratory medication;Demonstrates proper use of MDI's    Comments Darius Norris is doing well with his oxygen therapy.  He had a visit from oxygen company today as they checked up on his equipment.  They said all was good.  He continues to use his PLB and nose strips to help with air flow.  His saturations have continued to do well when he checks at home.    Goals/Expected Outcomes Short; Continue to use PLB to help with breathing Long: conitnued compliance             Initial Exercise Prescription:  Initial Exercise Prescription - 06/19/22 1400       Date of Initial Exercise RX and Referring Provider   Date 06/19/22    Referring Provider Delorise Jackson MD   Dr. Jenne Pane ([facility])     Oxygen   Oxygen Continuous    Liters 3    Maintain Oxygen Saturation 88% or higher      Recumbant Bike   Level 1    RPM 50    Watts 10    Minutes 15    METs 1.5      NuStep   Level 1    SPM 80    Minutes 15    METs 1.5      REL-XR   Level 1    Speed 50    Minutes 15    METs 1.5      T5 Nustep   Level 1    SPM 80    Minutes 15    METs 1.5      Biostep-RELP   Level 1    SPM 50    Minutes 15    METs 2      Track   Laps 5    Minutes 15    METs 1.27      Prescription Details   Frequency (times per week) 2    Duration Progress to 30 minutes of continuous aerobic without signs/symptoms of physical distress      Intensity   THRR 40-80% of Max Heartrate 97-132    Ratings of Perceived Exertion 11-13    Perceived Dyspnea 0-4      Progression   Progression Continue to progress workloads to maintain intensity without signs/symptoms of physical distress.       Resistance Training   Training Prescription Yes    Weight 3 kb    Reps 10-15             Perform Capillary Blood Glucose checks as needed.  Exercise Prescription Changes:   Exercise Prescription Changes     Row Name 06/19/22 1400 06/28/22 1200 07/12/22 1300 07/18/22 1400 07/27/22 1500     Response to Exercise   Blood Pressure (Admit) 146/64 116/64 128/64 -- 114/64   Blood Pressure (Exercise) 156/64 138/60 134/82 -- 132/82   Blood Pressure (Exit) 128/60 128/52 116/64 -- 122/64   Heart Rate (Admit) 62 bpm 62 bpm 67 bpm -- 56 bpm   Heart Rate (Exercise) 74 bpm 64 bpm 68 bpm -- 81 bpm   Heart Rate (Exit) 60 bpm 61 bpm 62 bpm -- 63 bpm   Oxygen Saturation (Admit) 93 % 95 % 90 % -- 94 %   Oxygen Saturation (Exercise) 80 % 96 % 96 % -- 88 %   Oxygen  Saturation (Exit) 96 % 94 % 99 % -- 96 %   Rating of Perceived Exertion (Exercise) 15 12 12  -- 13   Perceived Dyspnea (Exercise) 2 -- 2 -- 1   Symptoms hip and back pain 6/10, SOB SOB SOB -- SOB   Comments walk test results second full day of exercise -- -- --   Duration -- Progress to 30 minutes of  aerobic without signs/symptoms of physical distress Continue with 30 min of aerobic exercise without signs/symptoms of physical distress. -- Continue with 30 min of aerobic exercise without signs/symptoms of physical distress.   Intensity -- THRR unchanged THRR unchanged -- THRR unchanged     Progression   Progression -- Continue to progress workloads to maintain intensity without signs/symptoms of physical distress. Continue to progress workloads to maintain intensity without signs/symptoms of physical distress. -- Continue to progress workloads to maintain intensity without signs/symptoms of physical distress.   Average METs -- 1.95 2 -- 2.06     Resistance Training   Training Prescription -- Yes Yes -- Yes   Weight -- 4 lb 4 lb -- 4 lb   Reps -- 10-15 10-15 -- 10-15     Interval Training   Interval Training -- No No -- No      Oxygen   Oxygen -- Continuous Continuous -- Continuous   Liters -- 3 3 -- 3     Treadmill   MPH -- 1 1.2 -- 1.4   Grade -- 0 0 -- 0   Minutes -- 15 15 -- 15   METs -- 1.77 1.92 -- 2.07     Recumbant Bike   Level -- -- 2 -- 2   Minutes -- -- 15 -- 15   METs -- -- -- -- 2.37     NuStep   Level -- 4 -- -- --   Minutes -- 15 -- -- --   METs -- 2.1 -- -- --     REL-XR   Level -- 1 -- -- 3   Minutes -- 15 -- -- 15   METs -- -- -- -- 2.2     Biostep-RELP   Level -- 1 -- -- --   Minutes -- 15 -- -- --   METs -- 2 -- -- --     Home Exercise Plan   Plans to continue exercise at -- -- -- Home (comment)  Treadmill for Walking; Hand Weights Home (comment)  Treadmill for Walking; Hand Weights   Frequency -- -- -- Add 3 additional days to program exercise sessions. Add 3 additional days to program exercise sessions.   Initial Home Exercises Provided -- -- -- 07/18/22 07/18/22     Oxygen   Maintain Oxygen Saturation -- 88% or higher 88% or higher 88% or higher 88% or higher            Exercise Comments:   Exercise Comments     Row Name 06/22/22 1046           Exercise Comments First full day of exercise!  Patient was oriented to gym and equipment including functions, settings, policies, and procedures.  Patient's individual exercise prescription and treatment plan were reviewed.  All starting workloads were established based on the results of the 6 minute walk test done at initial orientation visit.  The plan for exercise progression was also introduced and progression will be customized based on patient's performance and goals.  Exercise Goals and Review:   Exercise Goals     Row Name 06/19/22 1449             Exercise Goals   Increase Physical Activity Yes       Intervention Provide advice, education, support and counseling about physical activity/exercise needs.;Develop an individualized exercise prescription for aerobic and resistive training  based on initial evaluation findings, risk stratification, comorbidities and participant's personal goals.       Expected Outcomes Short Term: Attend rehab on a regular basis to increase amount of physical activity.;Long Term: Add in home exercise to make exercise part of routine and to increase amount of physical activity.;Long Term: Exercising regularly at least 3-5 days a week.       Increase Strength and Stamina Yes       Intervention Provide advice, education, support and counseling about physical activity/exercise needs.;Develop an individualized exercise prescription for aerobic and resistive training based on initial evaluation findings, risk stratification, comorbidities and participant's personal goals.       Expected Outcomes Short Term: Increase workloads from initial exercise prescription for resistance, speed, and METs.;Short Term: Perform resistance training exercises routinely during rehab and add in resistance training at home;Long Term: Improve cardiorespiratory fitness, muscular endurance and strength as measured by increased METs and functional capacity (6MWT)       Able to understand and use rate of perceived exertion (RPE) scale Yes       Intervention Provide education and explanation on how to use RPE scale       Expected Outcomes Short Term: Able to use RPE daily in rehab to express subjective intensity level;Long Term:  Able to use RPE to guide intensity level when exercising independently       Able to understand and use Dyspnea scale Yes       Intervention Provide education and explanation on how to use Dyspnea scale       Expected Outcomes Short Term: Able to use Dyspnea scale daily in rehab to express subjective sense of shortness of breath during exertion;Long Term: Able to use Dyspnea scale to guide intensity level when exercising independently       Knowledge and understanding of Target Heart Rate Range (THRR) Yes       Intervention Provide education and explanation of  THRR including how the numbers were predicted and where they are located for reference       Expected Outcomes Short Term: Able to state/look up THRR;Short Term: Able to use daily as guideline for intensity in rehab;Long Term: Able to use THRR to govern intensity when exercising independently       Able to check pulse independently Yes       Intervention Provide education and demonstration on how to check pulse in carotid and radial arteries.;Review the importance of being able to check your own pulse for safety during independent exercise       Expected Outcomes Short Term: Able to explain why pulse checking is important during independent exercise;Long Term: Able to check pulse independently and accurately       Understanding of Exercise Prescription Yes       Intervention Provide education, explanation, and written materials on patient's individual exercise prescription       Expected Outcomes Short Term: Able to explain program exercise prescription;Long Term: Able to explain home exercise prescription to exercise independently                Exercise Goals Re-Evaluation :  Exercise Goals Re-Evaluation     [place] Name 06/22/22 1046 06/28/22 1241 07/04/22 1124 07/12/22 1322 07/27/22 1505     Exercise Goal Re-Evaluation   Exercise Goals Review -- Increase Physical Activity;Increase Strength and Stamina;Understanding of Exercise Prescription Increase Physical Activity;Increase Strength and Stamina;Understanding of Exercise Prescription Increase Physical Activity;Increase Strength and Stamina;Understanding of Exercise Prescription Increase Physical Activity;Increase Strength and Stamina;Understanding of Exercise Prescription   Comments Reviewed RPE and dyspnea scales, THR and program prescription with pt today.  Pt voiced understanding and was given a copy of goals to take home. Darius Norris has completed his first two full days of exercise again.  He was able to walk some.  We will conitnue to monitor  his progress. Darius Norris just started rehab not too long ago and has not done any home exercise yet. He has a treadmill at home that he plans to use. EP will go over home exercise with patient soon once he is further into the program. 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 We did review key points on home exercise: reviewing THR, monitoring HR & O2, PLB, monitoring intensity. Darius Norris is off to a good start in rehab.  He is up to 1.2 mph on the treadmill and prefers treadmill to track.  He is also using 4 lb weights.  We will continue to montior his progress. Darius Norris is doing well in rehab. He recently improved his overall average MET level to 2.06 METs. He also improved his speed on the treadmill to 1.4 mph. He has done well with 4 lb hand weights for resistance training as well. We will continue to monitor his progress in the program.   Expected Outcomes Short: Use RPE daily to regulate intensity. Long: Follow program prescription in THR. Short: Continue to monitor rehab regularly Long: Continue to follow program prescription Short: EP to go over home exercise Long: Exercise independently at home at appropriate prescription Short: Continue to increase workload on treadmill, try some incline Long: conitnue to improve stamina Short: Attempt to add incline on the treadmill while maintaining speed. Long: conitnue to improve strength and stamina.    [place] Name 08/01/22 1116             Exercise Goal Re-Evaluation   Exercise Goals Review Increase Physical Activity;Increase Strength and Stamina;Understanding of Exercise Prescription       Comments Darius Norris is doing well in rehab.  He is moving around the house more, but not using his treadmill.  He knows he needs to and will try to get back to it.  He does feel like his stamina is starting to improve.  He is still limited in his activity with his tailbone and hip.       Expected Outcomes Short: Get back on treadmill again Long: Continue to improve stamina                 Discharge Exercise Prescription (Final Exercise Prescription Changes):  Exercise Prescription Changes - 07/27/22 1500       Response to Exercise   Blood Pressure (Admit) 114/64    Blood Pressure (Exercise) 132/82    Blood Pressure (Exit) 122/64    Heart Rate (Admit) 56 bpm    Heart Rate (Exercise) 81 bpm    Heart Rate (Exit) 63 bpm    Oxygen Saturation (Admit) 94 %    Oxygen Saturation (Exercise) 88 %    Oxygen Saturation (Exit) 96 %    Rating of Perceived Exertion (Exercise) 13    Perceived Dyspnea (  Exercise) 1    Symptoms SOB    Duration Continue with 30 min of aerobic exercise without signs/symptoms of physical distress.    Intensity THRR unchanged      Progression   Progression Continue to progress workloads to maintain intensity without signs/symptoms of physical distress.    Average METs 2.06      Resistance Training   Training Prescription Yes    Weight 4 lb    Reps 10-15      Interval Training   Interval Training No      Oxygen   Oxygen Continuous    Liters 3      Treadmill   MPH 1.4    Grade 0    Minutes 15    METs 2.07      Recumbant Bike   Level 2    Minutes 15    METs 2.37      REL-XR   Level 3    Minutes 15    METs 2.2      Home Exercise Plan   Plans to continue exercise at Home (comment)   Treadmill for Walking; Hand Weights   Frequency Add 3 additional days to program exercise sessions.    Initial Home Exercises Provided 07/18/22      Oxygen   Maintain Oxygen Saturation 88% or higher             Nutrition:  Target Goals: Understanding of nutrition guidelines, daily intake of sodium '1500mg'$ , cholesterol '200mg'$ , calories 30% from fat and 7% or less from saturated fats, daily to have 5 or more servings of fruits and vegetables.  Education: All About Nutrition: -Group instruction provided by verbal, written material, interactive activities, discussions, models, and posters to present general guidelines for heart healthy  nutrition including fat, fiber, MyPlate, the role of sodium in heart healthy nutrition, utilization of the nutrition label, and utilization of this knowledge for meal planning. Follow up email sent as well. Written material given at graduation. Flowsheet Row Pulmonary Rehab from 07/27/2022 in [facility] Cardiac and Pulmonary Rehab  Education need identified 06/19/22       Biometrics:  Pre Biometrics - 06/19/22 1451       Pre Biometrics   Height 5' 5.6" (1.666 m)    Weight 184 lb 4.8 oz (83.6 kg)    BMI (Calculated) 30.12    Single Leg Stand 0 seconds              Nutrition Therapy Plan and Nutrition Goals:  Nutrition Therapy & Goals - 07/18/22 1120       Nutrition Therapy   RD appointment deferred Yes   Darius Norris would not like to speak with RD at this time; will continue to follow up.     Personal Nutrition Goals   Nutrition Goal Darius Norris would not like to speak with RD at this time; will continue to follow up.             Nutrition Assessments:  MEDIFICTS Score Key: ?70 Need to make dietary changes  40-70 Heart Healthy Diet ? 40 Therapeutic Level Cholesterol Diet  Flowsheet Row Pulmonary Rehab from 06/19/2022 in [facility] Cardiac and Pulmonary Rehab  Picture Your Plate Total Score on Admission 52      Picture Your Plate Scores: <92 Unhealthy dietary pattern with much room for improvement. 41-50 Dietary pattern unlikely to meet recommendations for good health and room for improvement. 51-60 More healthful dietary pattern, with some room for improvement.  >60 Healthy dietary pattern,  although there may be some specific behaviors that could be improved.   Nutrition Goals Re-Evaluation:  Nutrition Goals Re-Evaluation     [place] Name 07/04/22 1159 08/01/22 1120           Goals   Nutrition Goal -- Darius Norris would not like to speak with RD at this time; will continue to follow up.      Comment Patient has yet to meet with the RD at this time. He is not sure if he would like to make an  appointment, Darius Norris is doesn't want to meet with RD.  He is sticking to lean proteins.  He likes beans and potatoes.  His favorite is catelope especially during this time of the year.  He enjoys fruits like grapes too throughout the year.  He continues to work on portion control      Expected Outcome Short: Schedule appt with RD if interested Long: Follow healthy pulmonary based diet Short: Continue to work on portion control Long: continue to add in more vegetables               Nutrition Goals Discharge (Final Nutrition Goals Re-Evaluation):  Nutrition Goals Re-Evaluation - 08/01/22 [place] would not like to speak with RD at this time; will continue to follow up.    Comment Darius Norris is doesn't want to meet with RD.  He is sticking to lean proteins.  He likes beans and potatoes.  His favorite is catelope especially during this time of the year.  He enjoys fruits like grapes too throughout the year.  He continues to work on portion control    Expected Outcome Short: Continue to work on portion control Long: continue to add in more vegetables             Psychosocial: Target Goals: Acknowledge presence or absence of significant depression and/or stress, maximize coping skills, provide positive support system. Participant is able to verbalize types and ability to use techniques and skills needed for reducing stress and depression.   Education: Stress, Anxiety, and Depression - Group verbal and visual presentation to define topics covered.  Reviews how body is impacted by stress, anxiety, and depression.  Also discusses healthy ways to reduce stress and to treat/manage anxiety and depression.  Written material given at graduation. Flowsheet Row Pulmonary Rehab from 11/03/2021 in [facility] Cardiac and Pulmonary Rehab  Date 07/28/21  Educator [facility]  Instruction Review Code 1- [employer] Understanding       Education: Sleep Hygiene -Provides group verbal and written  instruction about how sleep can affect your health.  Define sleep hygiene, discuss sleep cycles and impact of sleep habits. Review good sleep hygiene tips.    Initial Review & Psychosocial Screening:  Initial Psych Review & Screening - 05/08/22 1044       Initial Review   Current issues with Current Sleep Concerns;Current Psychotropic Meds;Current Depression;Current Anxiety/Panic      Family Dynamics   Good Support System? Yes    Comments Darius Norris states that his health makes his life stressful. He has been having trouble sleeping. He takes medication for his depression and anxiety.He can look to his wife and his two sisters for support.      Barriers   Psychosocial barriers to participate in program There are no identifiable barriers or psychosocial needs.;The patient should benefit from training in stress management and relaxation.      Screening Interventions   Interventions Encouraged  to exercise;To provide support and resources with identified psychosocial needs;Provide feedback about the scores to participant    Expected Outcomes Short Term goal: Utilizing psychosocial counselor, staff and physician to assist with identification of specific Stressors or current issues interfering with healing process. Setting desired goal for each stressor or current issue identified.;Long Term Goal: Stressors or current issues are controlled or eliminated.;Short Term goal: Identification and review with participant of any Quality of Life or Depression concerns found by scoring the questionnaire.;Long Term goal: The participant improves quality of Life and PHQ9 Scores as seen by post scores and/or verbalization of changes             Quality of Life Scores:  Scores of 19 and below usually indicate a poorer quality of life in these areas.  A difference of  2-3 points is a clinically meaningful difference.  A difference of 2-3 points in the total score of the Quality of Life Index has been associated  with significant improvement in overall quality of life, self-image, physical symptoms, and general health in studies assessing change in quality of life.  PHQ-9: Review Flowsheet  More data exists      07/18/2022 06/19/2022 11/17/2021 06/13/2021 02/26/2019  Depression screen PHQ 2/9  Decreased Interest 1 2 1  0 1  Down, Depressed, Hopeless 0 1 0 0 0  PHQ - 2 Score 1 3 1  0 1  Altered sleeping 3 2 2  0 2  Tired, decreased energy 2 2 1 1 2   Change in appetite 0 0 1 0 1  Feeling bad or failure about yourself  0 0 0 0 0  Trouble concentrating 0 0 0 0 0  Moving slowly or fidgety/restless 0 0 0 0 0  Suicidal thoughts 0 0 0 0 0  PHQ-9 Score 6 7 5 1 6   Difficult doing work/chores Somewhat difficult Not difficult at all Somewhat difficult - Somewhat difficult   Interpretation of Total Score  Total Score Depression Severity:  1-4 = Minimal depression, 5-9 = Mild depression, 10-14 = Moderate depression, 15-19 = Moderately severe depression, 20-27 = Severe depression   Psychosocial Evaluation and Intervention:  Psychosocial Evaluation - 05/08/22 1046       Psychosocial Evaluation & Interventions   Interventions Encouraged to exercise with the program and follow exercise prescription;Stress management education;Relaxation education    Comments Darius Norris states that his health makes his life stressful. He has been having trouble sleeping. He takes medication for his depression and anxiety.He can look to his wife and his two sisters for support.    Expected Outcomes Short: Exercise regularly to support mental health and notify staff of any changes. Long: maintain mental health and well being through teaching of rehab or prescribed medications independently.    Continue Psychosocial Services  Follow up required by staff             Psychosocial Re-Evaluation:  Psychosocial Re-Evaluation     [place] Name 07/04/22 1131 08/01/22 1117           Psychosocial Re-Evaluation   Current issues with Current  Sleep Concerns;Current Stress Concerns Current Sleep Concerns;Current Stress Concerns      Comments Darius Norris is just getting starting in rehab. He states his sleep is not great as he has trouble falling asleep, however, it has always been like this for a while. He takes melatonin to help but not too interested in taking something else. We talked about trying guided meditations and relaxation before bed. Discussed calm. com that  has variation of meditation and deep breathing. He has some stress, but feels he tolerates it well. He is staying compliant with his medications and feels they are helping. Darius Norris is doing well in rehab. His hip and tailbone are still limiting his activity.  He bought a new mattress 3 months ago and he is ready to get rid of it as he is hurting more and his sheets don't fit.  He is upset that he spent so much money and it not working for him.  He has slats to support it as well as a box spring.  He is going to try to get rid of those.  His breathing is also limiting him in how much he is able to do.  He tries to use breathing to help focus and release stress.      Expected Outcomes Short: Work on different relaxation method before bed Long: Maintain posititve attitude Short: Try working on mattress Long: continue to focus on positives      Interventions Encouraged to attend Pulmonary Rehabilitation for the exercise --      Continue Psychosocial Services  Follow up required by staff --               Psychosocial Discharge (Final Psychosocial Re-Evaluation):  Psychosocial Re-Evaluation - 08/01/22 1117       Psychosocial Re-Evaluation   Current issues with Current Sleep Concerns;Current Stress Concerns    Comments Darius Norris is doing well in rehab. His hip and tailbone are still limiting his activity.  He bought a new mattress 3 months ago and he is ready to get rid of it as he is hurting more and his sheets don't fit.  He is upset that he spent so much money and it not working for him.   He has slats to support it as well as a box spring.  He is going to try to get rid of those.  His breathing is also limiting him in how much he is able to do.  He tries to use breathing to help focus and release stress.    Expected Outcomes Short: Try working on mattress Long: continue to focus on positives             Education: Education Goals: Education classes will be provided on a weekly basis, covering required topics. Participant will state understanding/return demonstration of topics presented.  Learning Barriers/Preferences:  Learning Barriers/Preferences - 05/08/22 1042       Learning Barriers/Preferences   Learning Barriers None    Learning Preferences Group Instruction;Individual Instruction;Pictoral;Skilled Demonstration;Verbal Instruction;Video;Written Material             General Pulmonary Education Topics:  Infection Prevention: - Provides verbal and written material to individual with discussion of infection control including proper hand washing and proper equipment cleaning during exercise session. Flowsheet Row Pulmonary Rehab from 07/27/2022 in [facility] Cardiac and Pulmonary Rehab  Date 05/08/22  Educator [facility]  Instruction Review Code 1- Verbalizes Understanding       Falls Prevention: - Provides verbal and written material to individual with discussion of falls prevention and safety. Flowsheet Row Pulmonary Rehab from 07/27/2022 in [facility] Cardiac and Pulmonary Rehab  Date 05/08/22  Educator [facility]  Instruction Review Code 1- Verbalizes Understanding       Chronic Lung Disease Review: - Group verbal instruction with posters, models, PowerPoint presentations and videos,  to review new updates, new respiratory medications, new advancements in procedures and treatments. Providing information on websites and "800" numbers for  continued self-education. Includes information about supplement oxygen, available portable oxygen systems, continuous and intermittent flow  rates, oxygen safety, concentrators, and Medicare reimbursement for oxygen. Explanation of Pulmonary Drugs, including class, frequency, complications, importance of spacers, rinsing mouth after steroid MDI's, and proper cleaning methods for nebulizers. Review of basic lung anatomy and physiology related to function, structure, and complications of lung disease. Review of risk factors. Discussion about methods for diagnosing sleep apnea and types of masks and machines for OSA. Includes a review of the use of types of environmental controls: home humidity, furnaces, filters, dust mite/pet prevention, HEPA vacuums. Discussion about weather changes, air quality and the benefits of nasal washing. Instruction on Warning signs, infection symptoms, calling MD promptly, preventive modes, and value of vaccinations. Review of effective airway clearance, coughing and/or vibration techniques. Emphasizing that all should Create an Action Plan. Written material given at graduation. Flowsheet Row Pulmonary Rehab from 11/03/2021 in [facility] Cardiac and Pulmonary Rehab  Date 09/22/21  Educator [facility]  Instruction Review Code 1- Verbalizes Understanding       AED/CPR: - Group verbal and written instruction with the use of models to demonstrate the basic use of the AED with the basic ABC's of resuscitation. Flowsheet Row Pulmonary Rehab from 02/26/2019 in [facility] Cardiac and Pulmonary Rehab  Date 01/01/19  Educator [facility]  Instruction Review Code 1- Verbalizes Understanding        Anatomy and Cardiac Procedures: - Group verbal and visual presentation and models provide information about basic cardiac anatomy and function. Reviews the testing methods done to diagnose heart disease and the outcomes of the test results. Describes the treatment choices: Medical Management, Angioplasty, or Coronary Bypass Surgery for treating various heart conditions including Myocardial Infarction, Angina, Valve Disease, and Cardiac Arrhythmias.   Written material given at graduation.   Medication Safety: - Group verbal and visual instruction to review commonly prescribed medications for heart and lung disease. Reviews the medication, class of the drug, and side effects. Includes the steps to properly store meds and maintain the prescription regimen.  Written material given at graduation. Flowsheet Row Pulmonary Rehab from 11/03/2021 in [facility] Cardiac and Pulmonary Rehab  Date 09/01/21  Educator [facility]  Instruction Review Code 1- Verbalizes Understanding       Other: -Provides group and verbal instruction on various topics (see comments)   Knowledge Questionnaire Score:  Knowledge Questionnaire Score - 06/19/22 1452       Knowledge Questionnaire Score   Pre Score 17/18              Core Components/Risk Factors/Patient Goals at Admission:  Personal Goals and Risk Factors at Admission - 06/19/22 1452       Core Components/Risk Factors/Patient Goals on Admission    Weight Management Yes;Weight Loss;Obesity    Intervention Weight Management: Develop a combined nutrition and exercise program designed to reach desired caloric intake, while maintaining appropriate intake of nutrient and fiber, sodium and fats, and appropriate energy expenditure required for the weight goal.;Weight Management: Provide education and appropriate resources to help participant work on and attain dietary goals.;Weight Management/Obesity: Establish reasonable short term and long term weight goals.;Obesity: Provide education and appropriate resources to help participant work on and attain dietary goals.    Admit Weight 184 lb 4.8 oz (83.6 kg)    Goal Weight: Short Term 180 lb (81.6 kg)    Goal Weight: Long Term 180 lb (81.6 kg)    Expected Outcomes Short Term: Continue to assess and modify interventions until short term  weight is achieved;Long Term: Adherence to nutrition and physical activity/exercise program aimed toward attainment of established weight  goal;Weight Loss: Understanding of general recommendations for a balanced deficit meal plan, which promotes 1-2 lb weight loss per week and includes a negative energy balance of [phone] kcal/d;Understanding of distribution of calorie intake throughout the day with the consumption of 4-5 meals/snacks;Understanding recommendations for meals to include 15-35% energy as protein, 25-35% energy from fat, 35-60% energy from carbohydrates, less than $RemoveB'200mg'KzXXRaGs$  of dietary cholesterol, 20-35 gm of total fiber daily    Intervention Provide education, individualized exercise plan and daily activity instruction to help decrease symptoms of SOB with activities of daily living.    Expected Outcomes Short Term: Improve cardiorespiratory fitness to achieve a reduction of symptoms when performing ADLs;Long Term: Be able to perform more ADLs without symptoms or delay the onset of symptoms    Increase knowledge of respiratory medications and ability to use respiratory devices properly  Yes    Intervention Provide education and demonstration as needed of appropriate use of medications, inhalers, and oxygen therapy.    Expected Outcomes Short Term: Achieves understanding of medications use. Understands that oxygen is a medication prescribed by physician. Demonstrates appropriate use of inhaler and oxygen therapy.;Long Term: Maintain appropriate use of medications, inhalers, and oxygen therapy.    Diabetes Yes    Intervention Provide education about signs/symptoms and action to take for hypo/hyperglycemia.;Provide education about proper nutrition, including hydration, and aerobic/resistive exercise prescription along with prescribed medications to achieve blood glucose in normal ranges: Fasting glucose 65-99 mg/dL    Expected Outcomes Short Term: Participant verbalizes understanding of the signs/symptoms and immediate care of hyper/hypoglycemia, proper foot care and importance of medication, aerobic/resistive exercise and nutrition  plan for blood glucose control.;Long Term: Attainment of HbA1C < 7%.    Hypertension Yes    Intervention Provide education on lifestyle modifcations including regular physical activity/exercise, weight management, moderate sodium restriction and increased consumption of fresh fruit, vegetables, and low fat dairy, alcohol moderation, and smoking cessation.;Monitor prescription use compliance.    Expected Outcomes Short Term: Continued assessment and intervention until BP is < 140/15mm HG in hypertensive participants. < 130/72mm HG in hypertensive participants with diabetes, heart failure or chronic kidney disease.;Long Term: Maintenance of blood pressure at goal levels.    Lipids Yes    Intervention Provide education and support for participant on nutrition & aerobic/resistive exercise along with prescribed medications to achieve LDL '70mg'$ , HDL >$Remo'40mg'NVjpZ$ .    Expected Outcomes Short Term: Participant states understanding of desired cholesterol values and is compliant with medications prescribed. Participant is following exercise prescription and nutrition guidelines.;Long Term: Cholesterol controlled with medications as prescribed, with individualized exercise RX and with personalized nutrition plan. Value goals: LDL < $Rem'70mg'vQPw$ , HDL > 40 mg.             Education:Diabetes - Individual verbal and written instruction to review signs/symptoms of diabetes, desired ranges of glucose level fasting, after meals and with exercise. Acknowledge that pre and post exercise glucose checks will be done for 3 sessions at entry of program. Flowsheet Row Pulmonary Rehab from 07/27/2022 in [facility] Cardiac and Pulmonary Rehab  Date 05/08/22  Educator [facility]  Instruction Review Code 1- Verbalizes Understanding       Know Your Numbers and Heart Failure: - Group verbal and visual instruction to discuss disease risk factors for cardiac and pulmonary disease and treatment options.  Reviews associated critical values for  Overweight/Obesity, Hypertension, Cholesterol, and Diabetes.  Discusses basics of  heart failure: signs/symptoms and treatments.  Introduces Heart Failure Zone chart for action plan for heart failure.  Written material given at graduation. Flowsheet Row Pulmonary Rehab from 07/27/2022 in [facility] Cardiac and Pulmonary Rehab  Date 07/27/22  Educator SB  Instruction Review Code 1- Verbalizes Understanding       Core Components/Risk Factors/Patient Goals Review:   Goals and Risk Factor Review     Row Name 07/04/22 1126 08/01/22 1121           Core Components/Risk Factors/Patient Goals Review   Personal Goals Review Weight Management/Obesity;Diabetes;Hypertension;Improve shortness of breath with ADL's Weight Management/Obesity;Diabetes;Hypertension;Improve shortness of breath with ADL's;Increase knowledge of respiratory medications and ability to use respiratory devices properly.      Review Darius Norris just started the program not too long ago. His doctor recently changed his BP medications about 3 weeks ago and has been feeling slightly dizzy from it. He reports BP stays stable and now low. Told him to let them know as soon as possible just in case they need to make changes. He checks his blood sugars once/ week as what he was advised by the doctor and states it is always under 130.Marland Kitchen He has lost 30 lbs since last year which was intentional for the most part. He is looking to lose more, just reiterated to make sure he is not losing muscle mass. He stopped drinking regular soda and switched to diet which he thinks made a huge difference. He stays compliant with medications Darius Norris is doing well in rehab.  His weight is down some and his dieruetic is working well, he just watches his timing of it to make sure he is not out when it hits.  He can tell a difference when he misses his dose as his feet will swell.  [facility].  His pressures are doing well as are his  sugars.  He continues to keep eye on them and takes his meds nights. His breathing is doing a little better.  He uses his inhaler routinely but only nebulizer when he feels clogged.  He was encourged to use nebulizer more frequently to help.      Expected Outcomes Short: Monitor BP closely and talk to doctor about dizziness Long: Continue to manage lifestyle risk factors Short: Try to use nebulizer more frequently Long: conitnue to work on weight loss               Core Components/Risk Factors/Patient Goals at Discharge (Final Review):   Goals and Risk Factor Review - 08/01/22 1121       Core Components/Risk Factors/Patient Goals Review   Personal Goals Review Weight Management/Obesity;Diabetes;Hypertension;Improve shortness of breath with ADL's;Increase knowledge of respiratory medications and ability to use respiratory devices properly.    Review Darius Norris is doing well in rehab.  His weight is down some and his dieruetic is working well, he just watches his timing of it to make sure he is not out when it hits.  He can tell a difference when he misses his dose as his feet will swell.  [facility].  His pressures are doing well as are his sugars.  He continues to keep eye on them and takes his meds nights. His breathing is doing a little better.  He uses his inhaler routinely but only nebulizer when he feels clogged.  He was encourged to use nebulizer more frequently to  help.    Expected Outcomes Short: Try to use nebulizer more frequently Long: conitnue to work on weight loss             ITP Comments:  ITP Comments     Row Name 05/08/22 1041 06/19/22 1443 06/22/22 1045 06/28/22 1024 07/26/22 0804   ITP Comments Virtual Visit completed. Patient informed on EP and RD appointment and 6 Minute walk test. Patient also informed of patient health questionnaires on My Chart. Patient Verbalizes understanding. Visit diagnosis can be found in [facility],  patient is [place]. Completed 6MWT and gym orientation. Initial ITP created and sent for review to Dr. Zetta Bills, Medical Director. First full day of exercise!  Patient was oriented to gym and equipment including functions, settings, policies, and procedures.  Patient's individual exercise prescription and treatment plan were reviewed.  All starting workloads were established based on the results of the 6 minute walk test done at initial orientation visit.  The plan for exercise progression was also introduced and progression will be customized based on patient's performance and goals. 30 Day review completed. Medical Director ITP review done, changes made as directed, and signed approval by Medical Director.   NEW 30 Day review completed. Medical Director ITP review done, changes made as directed, and signed approval by Medical Director.    [place] Name 08/23/22 1338           ITP Comments 30 Day review completed. Medical Director ITP review done, changes made as directed, and signed approval by Medical Director.                Comments:

## 2022-08-24 ENCOUNTER — Encounter: Payer: No Typology Code available for payment source | Admitting: *Deleted

## 2022-08-24 DIAGNOSIS — J449 Chronic obstructive pulmonary disease, unspecified: Secondary | ICD-10-CM | POA: Diagnosis not present

## 2022-08-24 NOTE — Progress Notes (Signed)
Daily Session Note  Patient Details  Name: EBERT FORRESTER MRN: [id number] Date of Birth: 11-18-1951 Referring Provider:   Flowsheet Row Pulmonary Rehab from 06/19/2022 in [facility] Cardiac and Pulmonary Rehab  Referring Provider Delorise Jackson MD  [Dr. Jenne Pane ([facility])]       Encounter Date: 08/24/2022  Check In:  Session Check In - 08/24/22 1136       Check-In   Supervising physician immediately available to respond to emergencies See telemetry face sheet for immediately available ER MD    Location [facility]-Cardiac & Pulmonary Rehab    Staff Present Heath Lark, RN, BSN, CCRP;Joseph Tessie Fass, RCP,RRT,BSRT;Meredith Sherryll Burger, RN BSN    Virtual Visit No    Medication changes reported     No    Fall or balance concerns reported    No    Warm-up and Cool-down Performed on first and last piece of equipment    Resistance Training Performed Yes    VAD Patient? No      Pain Assessment   Currently in Pain? No/denies                Social History   Tobacco Use  Smoking Status Former   Packs/day: 1.00   Years: 30.00   Total pack years: 30.00   Types: Cigarettes   Quit date: 12/18/2001   Years since quitting: 20.6  Smokeless Tobacco Never    Goals Met:  Proper associated with RPD/PD & O2 Sat Independence with exercise equipment Exercise tolerated well No report of concerns or symptoms today  Goals Unmet:  Not Applicable  Comments: Pt able to follow exercise prescription today without complaint.  Will continue to monitor for progression.    Dr. Emily Filbert is Medical Director for [place].  Dr. Ottie Glazier is Medical Director for [facility] Pulmonary Rehabilitation.

## 2022-08-31 ENCOUNTER — Encounter: Payer: No Typology Code available for payment source | Admitting: *Deleted

## 2022-08-31 DIAGNOSIS — J449 Chronic obstructive pulmonary disease, unspecified: Secondary | ICD-10-CM | POA: Diagnosis not present

## 2022-08-31 NOTE — Progress Notes (Signed)
Daily Session Note  Patient Details  Name: Darius Norris MRN: [id number] Date of Birth: 02/22/1951 Referring Provider:   Flowsheet Row Pulmonary Rehab from 06/19/2022 in [facility] Cardiac and Pulmonary Rehab  Referring Provider Delorise Jackson MD  [Dr. Jenne Pane ([facility])]       Encounter Date: 08/31/2022  Check In:  Session Check In - 08/31/22 1307       Check-In   Supervising physician immediately available to respond to emergencies See telemetry face sheet for immediately available ER MD    Location [facility]-Cardiac & Pulmonary Rehab    Staff Present Heath Lark, RN, BSN, CCRP;Noah Tickle, BS, Exercise Physiologist;Joseph [place], [place] RN ,[place]   Virtual Visit No    Medication changes reported     No    Fall or balance concerns reported    No    Warm-up and Cool-down Performed on first and last piece of equipment    Resistance Training Performed Yes    VAD Patient? No    PAD/SET Patient? No      Pain Assessment   Currently in Pain? No/denies                Social History   Tobacco Use  Smoking Status Former   Packs/day: 1.00   Years: 30.00   Total pack years: 30.00   Types: Cigarettes   Quit date: 12/18/2001   Years since quitting: 20.7  Smokeless Tobacco Never    Goals Met:  Proper associated with RPD/PD & O2 Sat Independence with exercise equipment Exercise tolerated well No report of concerns or symptoms today  Goals Unmet:  Not Applicable  Comments: Pt able to follow exercise prescription today without complaint.  Will continue to monitor for progression.    Dr. Emily Filbert is Medical Director for [place].  Dr. Ottie Glazier is Medical Director for [facility] Pulmonary Rehabilitation.

## 2022-09-05 ENCOUNTER — Encounter: Payer: No Typology Code available for payment source | Admitting: *Deleted

## 2022-09-05 DIAGNOSIS — J449 Chronic obstructive pulmonary disease, unspecified: Secondary | ICD-10-CM | POA: Diagnosis not present

## 2022-09-05 NOTE — Progress Notes (Signed)
Daily Session Note  Patient Details  Name: GILLIAN KLUEVER MRN: [id number] Date of Birth: [birth year] Referring Provider:   Flowsheet Row Pulmonary Rehab from 06/19/2022 in [facility] Cardiac and Pulmonary Rehab  Referring Provider Delorise Jackson MD  [Dr. Jenne Pane ([facility])]       Encounter Date: 09/05/2022  Check In:  Session Check In - 09/05/22 1148       Check-In   Supervising physician immediately available to respond to emergencies See telemetry face sheet for immediately available ER MD    Location [facility]-Cardiac & Pulmonary Rehab    Staff Present Heath Lark, RN, BSN, CCRP;Meredith Sherryll Burger, RN BSN;Noah Tickle, BS, Exercise Physiologist;Jessica Dexter, MA, RCEP, CCRP, CCET    Virtual Visit No    Medication changes reported     No    Fall or balance concerns reported    No    Warm-up and Cool-down Performed on first and last piece of equipment    Resistance Training Performed Yes    VAD Patient? No    PAD/SET Patient? No      Pain Assessment   Currently in Pain? No/denies                Social History   Tobacco Use  Smoking Status Former   Packs/day: 1.00   Years: 30.00   Total pack years: 30.00   Types: Cigarettes   Quit date: 12/18/2001   Years since quitting: 20.7  Smokeless Tobacco Never    Goals Met:  Proper associated with RPD/PD & O2 Sat Independence with exercise equipment Exercise tolerated well No report of concerns or symptoms today  Goals Unmet:  Not Applicable  Comments: Pt able to follow exercise prescription today without complaint.  Will continue to monitor for progression.    Dr. Emily Filbert is Medical Director for [place].  Dr. Ottie Glazier is Medical Director for [facility] Pulmonary Rehabilitation.

## 2022-09-07 ENCOUNTER — Encounter: Payer: No Typology Code available for payment source | Admitting: *Deleted

## 2022-09-07 DIAGNOSIS — J449 Chronic obstructive pulmonary disease, unspecified: Secondary | ICD-10-CM | POA: Diagnosis not present

## 2022-09-07 NOTE — Progress Notes (Signed)
Daily Session Note  Patient Details  Name: Darius Norris MRN: [id number] Date of Birth: 05-07-1951 Referring Provider:   Flowsheet Row Pulmonary Rehab from 06/19/2022 in [facility] Cardiac and Pulmonary Rehab  Referring Provider Delorise Jackson MD  [Dr. Jenne Pane ([facility])]       Encounter Date: 09/07/2022  Check In:  Session Check In - 09/07/22 1452       Check-In   Supervising physician immediately available to respond to emergencies See telemetry face sheet for immediately available ER MD    Location [facility]-Cardiac & Pulmonary Rehab    Staff Present Alberteen Sam, MA, RCEP, CCRP, CCET;Joseph Adeline, RCP,RRT,BSRT;Other   Darlyne Russian, RN   Virtual Visit No    Medication changes reported     No    Fall or balance concerns reported    No    Tobacco Cessation No Change    Warm-up and Cool-down Performed on first and last piece of equipment    Resistance Training Performed Yes    VAD Patient? No    PAD/SET Patient? No      Pain Assessment   Currently in Pain? No/denies                Social History   Tobacco Use  Smoking Status Former   Packs/day: 1.00   Years: 30.00   Total pack years: 30.00   Types: Cigarettes   Quit date: 12/18/2001   Years since quitting: 20.7  Smokeless Tobacco Never    Goals Met:  Independence with exercise equipment Exercise tolerated well No report of concerns or symptoms today Strength training completed today  Goals Unmet:  Not Applicable  Comments: Pt able to follow exercise prescription today without complaint.  Will continue to monitor for progression.    Dr. Emily Filbert is Medical Director for [place].  Dr. Ottie Glazier is Medical Director for [facility] Pulmonary Rehabilitation.

## 2022-09-14 ENCOUNTER — Emergency Department
Admission: EM | Admit: 2022-09-14 | Discharge: 2022-09-14 | Disposition: A | Discharge status: Home / Self Care | Payer: No Typology Code available for payment source | Attending: Emergency Medicine | Admitting: Emergency Medicine

## 2022-09-14 ENCOUNTER — Emergency Department: Payer: No Typology Code available for payment source

## 2022-09-14 ENCOUNTER — Other Ambulatory Visit: Payer: Self-pay

## 2022-09-14 ENCOUNTER — Encounter: Payer: No Typology Code available for payment source | Admitting: *Deleted

## 2022-09-14 DIAGNOSIS — J441 Chronic obstructive pulmonary disease with (acute) exacerbation: Secondary | ICD-10-CM | POA: Diagnosis not present

## 2022-09-14 DIAGNOSIS — J449 Chronic obstructive pulmonary disease, unspecified: Secondary | ICD-10-CM | POA: Diagnosis not present

## 2022-09-14 DIAGNOSIS — R0602 Shortness of breath: Secondary | ICD-10-CM | POA: Diagnosis present

## 2022-09-14 LAB — CBC
HCT: 38.3 % — ABNORMAL LOW (ref 39.0–52.0)
Hemoglobin: 12 g/dL — ABNORMAL LOW (ref 13.0–17.0)
MCH: 29.1 pg (ref 26.0–34.0)
MCHC: 31.3 g/dL (ref 30.0–36.0)
MCV: 92.7 fL (ref 80.0–100.0)
Platelets: 267 10*3/uL (ref 150–400)
RBC: 4.13 MIL/uL — ABNORMAL LOW (ref 4.22–5.81)
RDW: 16.7 % — ABNORMAL HIGH (ref 11.5–15.5)
WBC: 10.6 10*3/uL — ABNORMAL HIGH (ref 4.0–10.5)
nRBC: 0 % (ref 0.0–0.2)

## 2022-09-14 LAB — BASIC METABOLIC PANEL
Anion gap: 13 (ref 5–15)
BUN: 14 mg/dL (ref 8–23)
CO2: 27 mmol/L (ref 22–32)
Calcium: 9.2 mg/dL (ref 8.9–10.3)
Chloride: 101 mmol/L (ref 98–111)
Creatinine, Ser: 0.92 mg/dL (ref 0.61–1.24)
GFR, Estimated: >60 mL/min (ref >60)
Glucose, Bld: 120 mg/dL — ABNORMAL HIGH (ref 70–99)
Potassium: 4 mmol/L (ref 3.5–5.1)
Sodium: 141 mmol/L (ref 135–145)

## 2022-09-14 LAB — TROPONIN I (HIGH SENSITIVITY)
Troponin I (High Sensitivity): 10 ng/L (ref <18)
Troponin I (High Sensitivity): 10 ng/L (ref <18)

## 2022-09-14 LAB — D-DIMER, QUANTITATIVE: D-Dimer, Quant: 0.31 ug/mL-FEU (ref 0.00–0.50)

## 2022-09-14 LAB — LACTIC ACID, PLASMA: Lactic Acid, Venous: 1.6 mmol/L (ref 0.5–1.9)

## 2022-09-14 MED ORDER — STERILE WATER FOR INJECTION IJ SOLN
INTRAMUSCULAR | Status: AC
  Administered 2022-09-14: 2 mL
  Filled 2022-09-14: qty 10

## 2022-09-14 MED ORDER — PREDNISONE 10 MG (21) PO TBPK: ORAL_TABLET | ORAL | 0 refills | Status: DC

## 2022-09-14 MED ORDER — METHYLPREDNISOLONE SODIUM SUCC 125 MG IJ SOLR
125.0000 mg | Freq: Once | INTRAMUSCULAR | Status: AC
  Administered 2022-09-14: 125 mg via INTRAVENOUS
  Filled 2022-09-14: qty 2

## 2022-09-14 MED ORDER — DOXYCYCLINE HYCLATE 100 MG PO TABS: 100.0000 mg | ORAL_TABLET | Freq: Two times a day (BID) | ORAL | 0 refills | Status: AC

## 2022-09-14 MED ORDER — KETOROLAC TROMETHAMINE 15 MG/ML IJ SOLN
15.0000 mg | Freq: Once | INTRAMUSCULAR | Status: AC
  Administered 2022-09-14: 15 mg via INTRAVENOUS
  Filled 2022-09-14: qty 1

## 2022-09-14 MED ORDER — ALBUTEROL SULFATE (2.5 MG/3ML) 0.083% IN NEBU
2.5000 mg | INHALATION_SOLUTION | Freq: Once | RESPIRATORY_TRACT | Status: AC
  Administered 2022-09-14: 2.5 mg via RESPIRATORY_TRACT
  Filled 2022-09-14: qty 3

## 2022-09-14 NOTE — ED Notes (Signed)
ED Provider at bedside. 

## 2022-09-14 NOTE — ED Provider Notes (Signed)
[facility] Provider Note    Event Date/Time   First MD Initiated Contact with Patient 09/14/22 1546     (approximate)   History   Chest Pain   HPI  Darius Norris is a 71 y.o. male who presents to the emergency department today because of concerns for shortness of breath and chest pain.  Symptoms started a few days ago.  They have persisted.  Has been accompanied by a cough that is intermittently productive of green phlegm.  The pain in his chest is located primarily on the left side.  Patient is on 3 L of oxygen at baseline.  He has been using his inhalers.  Patient also has a nebulizer machine but has not used it.  No known sick contacts.      Physical Exam   Triage Vital Signs: ED Triage Vitals  Enc Vitals Group     BP 09/14/22 1529 138/67     Pulse Rate 09/14/22 1529 63     Resp 09/14/22 1529 (!) 23     Temp 09/14/22 1529 98.1 F (36.7 C)     Temp Source 09/14/22 1529 Oral     SpO2 09/14/22 1529 95 %     Weight 09/14/22 1527 180 lb (81.6 kg)     Height 09/14/22 1527 '5\' 6"'$  (1.676 m)     Head Circumference --      Peak Flow --      Pain Score 09/14/22 1527 6     Pain Loc --      Pain Edu? --      Excl. in [place]? --     Most recent vital signs: Vitals:   09/14/22 1529  BP: 138/67  Pulse: 63  Resp: (!) 23  Temp: 98.1 F (36.7 C)  SpO2: 95%    General: Awake, alert, oriented. CV:  Good peripheral perfusion. Regular rate and rhythm. Resp:  Slightly increased work of breathing. Poor air movement. Abd:  No distention. Non tender.    ED Results / Procedures / Treatments   Labs (all labs ordered are listed, but only abnormal results are displayed) Labs Reviewed  BASIC METABOLIC PANEL - Abnormal; Notable for the following components:      Result Value   Glucose, Bld 120 (*)    All other components within normal limits  CBC - Abnormal; Notable for the following components:   WBC 10.6 (*)    RBC 4.13 (*)    Hemoglobin 12.0 (*)     HCT 38.3 (*)    RDW 16.7 (*)    All other components within normal limits  LACTIC ACID, PLASMA  D-DIMER, QUANTITATIVE  TROPONIN I (HIGH SENSITIVITY)  TROPONIN I (HIGH SENSITIVITY)     EKG  I, Nance Pear, attending physician, personally viewed and interpreted this EKG  EKG Time: 1524 Rate: 64 Rhythm: sinus rhythm Axis: normal Intervals: qtc 412 QRS: narrow, q waves v1 ST changes: no st elevation Impression: abnormal ekg    RADIOLOGY I independently interpreted and visualized the CXR. My interpretation: No pneumonia Radiology interpretation:  IMPRESSION:  Emphysema. Mild reticular basilar opacity likely chronic disease.  Subsegmental atelectasis or scar left base. No definite acute  confluent superimposed opacity.      PROCEDURES:  Critical Care performed: No  Procedures   MEDICATIONS ORDERED IN ED: Medications  methylPREDNISolone sodium succinate (SOLU-MEDROL) 125 mg/2 mL injection 125 mg (125 mg Intravenous Given 09/14/22 1649)  albuterol (PROVENTIL) (2.5 MG/3ML) 0.083% nebulizer solution 2.5 mg (  2.5 mg Nebulization Given 09/14/22 1649)  albuterol (PROVENTIL) (2.5 MG/3ML) 0.083% nebulizer solution 2.5 mg (2.5 mg Nebulization Given 09/14/22 1649)  albuterol (PROVENTIL) (2.5 MG/3ML) 0.083% nebulizer solution 2.5 mg (2.5 mg Nebulization Given 09/14/22 1649)  sterile water (preservative free) injection (2 mLs  Given 09/14/22 1649)     IMPRESSION / MDM / ASSESSMENT AND PLAN / ED COURSE  I reviewed the triage vital signs and the nursing notes.                              Differential diagnosis includes, but is not limited to, COPD, pneumonia, PE, ACS, pneumothorax.  Patient's presentation is most consistent with acute presentation with potential threat to life or bodily function.  Patient presented to the emergency department today because of concerns for shortness of breath and some chest pain.  Troponin was negative x2.  D-dimer negative.  Chest x-ray  without any obvious pneumonia or pneumothorax.  Patient did feel better after steroids and breathing treatments.  Requested discharge home.  At this time given reassuring work-up and that patient feels better I think is reasonable for patient be discharged home.  At this time I think COPD exacerbation likely.  I discussed this with the patient.  Discussed return precautions and follow-up with primary care.  FINAL CLINICAL IMPRESSION(S) / ED DIAGNOSES   Final diagnoses:  COPD exacerbation ([place])    Note:  This document was prepared using Dragon voice recognition software and may include unintentional dictation errors.    Nance Pear, MD 09/14/22 2119

## 2022-09-14 NOTE — ED Provider Notes (Signed)
Emergency Medicine Provider Triage Evaluation Note  Darius Norris , a 71 y.o. male  was evaluated in triage.  Patient has a history of COPD, hypertension, GERD, coronary artery disease, A-fib and history of prior MI, presents to the emergency department with left-sided chest pain and shortness of breath.  Patient states that he was recently diagnosed with pneumonia and has had persistent cough.  He is not currently taking any antibiotics.  Denies fever but states that he has had a mild sore throat.  No other sick contacts at home.  Review of Systems  Positive: Patient has left sided chest pain and SOB.  Negative: No vomiting or abdominal pain.   Physical Exam  There were no vitals taken for this visit. Gen:   Awake, no distress   Resp:  Normal effort  MSK:   Moves extremities without difficulty  Other:    Medical Decision Making  Medically screening exam initiated at 3:23 PM.  Appropriate orders placed.  Darius Norris was informed that the remainder of the evaluation will be completed by another provider, this initial triage assessment does not replace that evaluation, and the importance of remaining in the ED until their evaluation is complete.     Darius Norris, Darius Norris 09/14/22 1525    Darius Mew, MD 09/14/22 2000

## 2022-09-14 NOTE — ED Provider Notes (Incomplete)
[facility] Provider Note    Event Date/Time   First MD Initiated Contact with Patient 09/14/22 1546     (approximate)   History   Chest Pain   HPI {Remember to add pertinent medical, surgical, social, and/or OB history to HPI:1} Darius Norris is a 71 y.o. male  ***       Physical Exam   Triage Vital Signs: ED Triage Vitals  Enc Vitals Group     BP 09/14/22 1529 138/67     Pulse Rate 09/14/22 1529 63     Resp 09/14/22 1529 (!) 23     Temp 09/14/22 1529 98.1 F (36.7 C)     Temp Source 09/14/22 1529 Oral     SpO2 09/14/22 1529 95 %     Weight 09/14/22 1527 180 lb (81.6 kg)     Height 09/14/22 1527 '5\' 6"'$  (1.676 m)     Head Circumference --      Peak Flow --      Pain Score 09/14/22 1527 6     Pain Loc --      Pain Edu? --      Excl. in [place]? --     Most recent vital signs: Vitals:   09/14/22 1529  BP: 138/67  Pulse: 63  Resp: (!) 23  Temp: 98.1 F (36.7 C)  SpO2: 95%    {Only need to document appropriate and relevant physical exam:1} General: Awake, no distress. *** CV:  Good peripheral perfusion. *** Resp:  Normal effort. *** Abd:  No distention. *** Other:  ***   ED Results / Procedures / Treatments   Labs (all labs ordered are listed, but only abnormal results are displayed) Labs Reviewed  BASIC METABOLIC PANEL - Abnormal; Notable for the following components:      Result Value   Glucose, Bld 120 (*)    All other components within normal limits  CBC - Abnormal; Notable for the following components:   WBC 10.6 (*)    RBC 4.13 (*)    Hemoglobin 12.0 (*)    HCT 38.3 (*)    RDW 16.7 (*)    All other components within normal limits  LACTIC ACID, PLASMA  LACTIC ACID, PLASMA  TROPONIN I (HIGH SENSITIVITY)     EKG  I, Nance Pear, attending physician, personally viewed and interpreted this EKG  EKG Time: 1524 Rate: 64 Rhythm: sinus rhythm Axis: normal Intervals: qtc 412 QRS: narrow, q waves v1 ST  changes: no st elevation Impression: abnormal ekg    RADIOLOGY I independently interpreted and visualized the CXR. My interpretation: No pneumonia Radiology interpretation:  IMPRESSION:  Emphysema. Mild reticular basilar opacity likely chronic disease.  Subsegmental atelectasis or scar left base. No definite acute  confluent superimposed opacity.      PROCEDURES:  Critical Care performed: {CriticalCareYesNo:19197::"Yes, see critical care procedure note(s)","No"}  Procedures   MEDICATIONS ORDERED IN ED: Medications - No data to display   IMPRESSION / MDM / [place] / ED COURSE  I reviewed the triage vital signs and the nursing notes.                              Differential diagnosis includes, but is not limited to, ***  Patient's presentation is most consistent with {EM COPA:27473}  {If the patient is on the monitor, remove the brackets and asterisks on the sentence below and remember to document it as  a Procedure as well. Otherwise delete the sentence below:1} {**The patient is on the cardiac monitor to evaluate for evidence of arrhythmia and/or significant heart rate changes.**} {Remember to include, when applicable, any/all of the following data: independent review of imaging independent review of labs (comment specifically on pertinent positives and negatives) review of specific prior hospitalizations, PCP/specialist notes, etc. discuss meds given and prescribed document any discussion with consultants (including hospitalists) any clinical decision tools you used and why (PECARN, NEXUS, etc.) did you consider admitting the patient? document social determinants of health affecting patient's care (homelessness, inability to follow up in a timely fashion, etc) document any pre-existing conditions increasing risk on current visit (e.g. diabetes and HTN increasing danger of high-risk chest pain/ACS) describes what meds you gave (especially parenteral) and  why any other interventions?:1}     FINAL CLINICAL IMPRESSION(S) / ED DIAGNOSES   Final diagnoses:  None     Rx / DC Orders   ED Discharge Orders     None        Note:  This document was prepared using Dragon voice recognition software and may include unintentional dictation errors.

## 2022-09-14 NOTE — ED Notes (Signed)
Pt was reports decreased chest discomfort after nebulizer and meds.

## 2022-09-14 NOTE — Progress Notes (Signed)
Daily Session Note  Patient Details  Name: Darius Norris MRN: [id number] Date of Birth: 1951-07-23 Referring Provider:   Flowsheet Row Pulmonary Rehab from 06/19/2022 in [facility]  Referring Provider Delorise Jackson MD  [Dr. Jenne Pane ([facility])]       Encounter Date: 09/14/2022  Check In:  Session Check In - 09/14/22 1137       Check-In   Supervising physician immediately available to respond to emergencies See telemetry face sheet for immediately available ER MD    Location [facility]-Cardiac & Pulmonary Rehab    Staff Present Renita Papa, RN BSN;Jessica Luan Pulling, MA, RCEP, CCRP, Mindi Curling, RN, [place]    Virtual Visit No    Medication changes reported     No    Fall or balance concerns reported    No    Warm-up and Cool-down Performed on first and last piece of equipment    Resistance Training Performed Yes    VAD Patient? No    PAD/SET Patient? No      Pain Assessment   Currently in Pain? No/denies                Social History   Tobacco Use  Smoking Status Former   Packs/day: 1.00   Years: 30.00   Total pack years: 30.00   Types: Cigarettes   Quit date: 12/18/2001   Years since quitting: 20.7  Smokeless Tobacco Never    Goals Met:  Independence with exercise equipment Exercise tolerated well No report of concerns or symptoms today Strength training completed today  Goals Unmet:  Not Applicable  Comments: Pt able to follow exercise prescription today without complaint.  Will continue to monitor for progression.    Dr. Emily Filbert is Medical Director for [facility].  Dr. Ottie Glazier is Medical Director for [facility] Pulmonary Rehabilitation.

## 2022-09-14 NOTE — Discharge Instructions (Signed)
Please seek medical attention for any high fevers, chest pain, shortness of breath, change in behavior, persistent vomiting, bloody stool or any other new or concerning symptoms.  

## 2022-09-14 NOTE — ED Triage Notes (Signed)
Pt arrives with c/o increasing SOB and CP. Pt does wear home O2 at 3L. Per pt, he has had PNA a couple of times in the past few months. Pt denies fevers.

## 2022-09-20 ENCOUNTER — Encounter: Payer: Self-pay | Admitting: *Deleted

## 2022-09-20 DIAGNOSIS — J449 Chronic obstructive pulmonary disease, unspecified: Secondary | ICD-10-CM

## 2022-09-20 NOTE — Progress Notes (Signed)
Pulmonary Individual Treatment Plan  Patient Details  Name: Darius Norris MRN: [id number] Date of Birth: November 28, 1951 Referring Provider:   Flowsheet Row Pulmonary Rehab from 06/19/2022 in [facility] Cardiac and Pulmonary Rehab  Referring Provider Delorise Jackson MD  [Dr. Jenne Pane ([facility])]       Initial Encounter Date:  Flowsheet Row Pulmonary Rehab from 06/19/2022 in [facility] Cardiac and Pulmonary Rehab  Date 06/19/22       Visit Diagnosis: Chronic obstructive pulmonary disease, unspecified COPD type ([place])  Patient's Home Medications on Admission:  Current Outpatient Medications:    acetaminophen (TYLENOL) 325 MG tablet, Take 650 mg by mouth every 6 (six) hours as needed., Disp: , Rfl:    albuterol (VENTOLIN HFA) 108 (90 Base) MCG/ACT inhaler, Inhale into the lungs., Disp: , Rfl:    aspirin EC 81 MG EC tablet, Take 1 tablet (81 mg total) by mouth daily., Disp: 180 tablet, Rfl: 0   atorvastatin (LIPITOR) 80 MG tablet, Take 1 tablet (80 mg total) by mouth at bedtime., Disp: 30 tablet, Rfl: 0   budesonide-formoterol (SYMBICORT) 160-4.5 MCG/ACT inhaler, Inhale 2 puffs into the lungs 2 (two) times daily., Disp: , Rfl:    busPIRone (BUSPAR) 10 MG tablet, Take by mouth., Disp: , Rfl:    clobetasol (TEMOVATE) 0.05 % external solution, Apply topically., Disp: , Rfl:    dabigatran (PRADAXA) 150 MG CAPS capsule, Take 150 mg by mouth 2 (two) times daily., Disp: , Rfl:    diltiazem (CARDIZEM CD) 300 MG 24 hr capsule, Take 1 capsule (300 mg total) by mouth daily., Disp: 30 capsule, Rfl: 2   doxycycline (VIBRA-TABS) 100 MG tablet, Take 1 tablet (100 mg total) by mouth 2 (two) times daily for 7 days., Disp: 14 tablet, Rfl: 0   doxycycline (VIBRAMYCIN) 100 MG capsule, Take 1 capsule (100 mg total) by mouth 2 (two) times daily., Disp: 14 capsule, Rfl: 0   furosemide (LASIX) 40 MG tablet, Take 1 tablet (40 mg total) by mouth daily., Disp: 30 tablet, Rfl: 2   gabapentin (NEURONTIN) 300 MG capsule, Take  1 capsule by mouth 3 (three) times daily as needed., Disp: , Rfl:    guaifenesin (HUMIBID E) 400 MG TABS tablet, Take by mouth., Disp: , Rfl:    hydrocortisone 2.5 % cream, Apply topically., Disp: , Rfl:    hydrOXYzine (ATARAX/VISTARIL) 25 MG tablet, Take by mouth., Disp: , Rfl:    Ipratropium-Albuterol (COMBIVENT) 20-100 MCG/ACT AERS respimat, Inhale 1 puff into the lungs every 6 (six) hours as needed for wheezing or shortness of breath., Disp: , Rfl:    isosorbide mononitrate (IMDUR) 60 MG 24 hr tablet, Take 60 mg by mouth daily., Disp: , Rfl:    ketorolac (ACULAR) 0.5 % ophthalmic solution, INSTILL 1 DROP OPERATIVE EYE FOUR TIMES A DAY FOR POSTOPERATIVE PAIN/INFLAMMATION SEND TO 4B AMB SURGERY OR ON DAY OF SURGERY 04/05/22, Disp: , Rfl:    lidocaine (XYLOCAINE) 5 % ointment, Apply topically., Disp: , Rfl:    loratadine (CLARITIN) 10 MG tablet, Take 10 mg by mouth daily as needed for allergies. , Disp: , Rfl:    Melatonin 3 MG CAPS, Take 2 capsules by mouth at bedtime., Disp: , Rfl:    metFORMIN (GLUCOPHAGE-XR) 500 MG 24 hr tablet, Take 500 mg by mouth in the morning and at bedtime., Disp: , Rfl:    methotrexate (RHEUMATREX) 2.5 MG tablet, Take 10 mg by mouth once a week., Disp: , Rfl:    metoprolol tartrate (LOPRESSOR) 100 MG tablet,  Take 1 tablet (100 mg total) by mouth 2 (two) times daily., Disp: 60 tablet, Rfl: 2   mometasone (ASMANEX) 220 MCG/INH inhaler, Take by mouth., Disp: , Rfl:    moxifloxacin (VIGAMOX) 0.5 % ophthalmic solution, INSTILL 1 DROP IN LEFT EYE FOUR TIMES A DAY FOR CATARACT SURGERY SEND TO 4B AMB SURGERY OR ON DAY OF SURGERY 04/05/22, Disp: , Rfl:    predniSONE (STERAPRED UNI-PAK 21 TAB) 10 MG (21) TBPK tablet, Per packaging instructions, Disp: 21 each, Rfl: 0   senna (SENOKOT) 8.6 MG TABS tablet, Take 1 tablet by mouth daily as needed for mild constipation., Disp: , Rfl:    simethicone (MYLICON) 80 MG chewable tablet, CHEW ONE TABLET BY MOUTH THREE TIMES A DAY AFTER MEALS AS  NEEDED FOR GAS, Disp: , Rfl:   Past Medical History: Past Medical History:  Diagnosis Date   A-fib ([place])    CAD (coronary artery disease)    Colitis    COPD (chronic obstructive pulmonary disease) (HCC)    Emphysema lung (HCC)    GERD (gastroesophageal reflux disease)    Hypertension    MI (myocardial infarction) ([place]) 1999    Tobacco Use: Social History   Tobacco Use  Smoking Status Former   Packs/day: 1.00   Years: 30.00   Total pack years: 30.00   Types: Cigarettes   Quit date: 12/18/2001   Years since quitting: 20.7  Smokeless Tobacco Never    Labs: Review Flowsheet  More data may exist      Latest Ref Rng & Units 02/04/2013 09/04/2016 05/02/2018 01/05/2019 12/06/2021  Labs for ITP Cardiac and Pulmonary Rehab  Cholestrol 0 - 200 mg/dL 185  116  - - 106   LDL (calc) 0 - 99 mg/dL 114  51  - - 51   HDL-C >40 mg/dL 38  42  - - 33   Trlycerides <150 mg/dL 166  116  - - 108   Hemoglobin A1c 4.8 - 5.6 % - 6.4  - - 5.9   Bicarbonate 20.0 - 28.0 mmol/L - - 36.3  29.6  -  O2 Saturation % - - - 81.7  -     Pulmonary Assessment Scores:  Pulmonary Assessment Scores     Row Name 06/19/22 1455         ADL UCSD   ADL Phase Entry     SOB Score total 49     Rest 0     Walk 2     Stairs 4     Bath 3     Dress 1     Shop 2       CAT Score   CAT Score 17       mMRC Score   mMRC Score 2              UCSD: Self-administered rating of dyspnea associated with activities of daily living (ADLs) 6-point scale (0 = "not at all" to 5 = "maximal or unable to do because of breathlessness")  Scoring Scores range from 0 to 120.  Minimally important difference is 5 units  CAT: CAT can identify the health impairment of COPD patients and is better correlated with disease progression.  CAT has a scoring range of zero to 40. The CAT score is classified into four groups of low (less than 10), medium (10 - 20), high (21-30) and very high (31-40) based on the impact level of  disease on health status. A CAT score over 10 suggests significant  symptoms.  A worsening CAT score could be explained by an exacerbation, poor medication adherence, poor inhaler technique, or progression of COPD or comorbid conditions.  CAT MCID is 2 points  mMRC: mMRC (Modified Medical Research Council) Dyspnea Scale is used to assess the degree of baseline functional disability in patients of respiratory disease due to dyspnea. No minimal important difference is established. A decrease in score of 1 point or greater is considered a positive change.   Pulmonary Function Assessment:  Pulmonary Function Assessment - 06/19/22 1455       Breath   Shortness of Breath Yes;Fear of Shortness of Breath;Limiting activity             Exercise Target Goals: Exercise Program Goal: Individual exercise prescription set using results from initial 6 min walk test and THRR while considering  patient's activity barriers and safety.   Exercise Prescription Goal: Initial exercise prescription builds to 30-45 minutes a day of aerobic activity, 2-3 days per week.  Home exercise guidelines will be given to patient during program as part of exercise prescription that the participant will acknowledge.  Education: Aerobic Exercise: - Group verbal and visual presentation on the components of exercise prescription. Introduces F.I.T.T principle from ACSM for exercise prescriptions.  Reviews F.I.T.T. principles of aerobic exercise including progression. Written material given at graduation. Flowsheet Row Pulmonary Rehab from 11/03/2021 in [facility] Cardiac and Pulmonary Rehab  Date 10/13/21  Educator [facility]  Instruction Review Code 1- Verbalizes Understanding       Education: Resistance Exercise: - Group verbal and visual presentation on the components of exercise prescription. Introduces F.I.T.T principle from ACSM for exercise prescriptions  Reviews F.I.T.T. principles of resistance exercise including progression.  Written material given at graduation.    Education: Exercise & Equipment Safety: - Individual verbal instruction and demonstration of equipment use and safety with use of the equipment. Flowsheet Row Pulmonary Rehab from 07/27/2022 in [facility] Cardiac and Pulmonary Rehab  Date 05/08/22  Educator [facility]  Instruction Review Code 1- Verbalizes Understanding       Education: Exercise Physiology & General Exercise Guidelines: - Group verbal and written instruction with models to review the exercise physiology of the cardiovascular system and associated critical values. Provides general exercise guidelines with specific guidelines to those with heart or lung disease.  Flowsheet Row Pulmonary Rehab from 11/03/2021 in [facility] Cardiac and Pulmonary Rehab  Date 08/04/21  Educator [facility]  Instruction Review Code 1- Verbalizes Understanding       Education: Flexibility, Balance, Mind/Body Relaxation: - Group verbal and visual presentation with interactive activity on the components of exercise prescription. Introduces F.I.T.T principle from ACSM for exercise prescriptions. Reviews F.I.T.T. principles of flexibility and balance exercise training including progression. Also discusses the mind body connection.  Reviews various relaxation techniques to help reduce and manage stress (i.e. Deep breathing, progressive muscle relaxation, and visualization). Balance handout provided to take home. Written material given at graduation. Flowsheet Row Pulmonary Rehab from 11/03/2021 in [facility] Cardiac and Pulmonary Rehab  Date 10/27/21  Educator AS  Instruction Review Code 1- Verbalizes Understanding       Activity Barriers & Risk Stratification:  Activity Barriers & Cardiac Risk Stratification - 06/19/22 1445       Activity Barriers & Cardiac Risk Stratification   Activity Barriers Shortness of Breath;Deconditioning;Back Problems;Muscular Weakness;Balance Concerns;Assistive Device;Other (comment);Joint Problems     Comments neuropathy in feet, back and hip pain             6 Minute Walk:  6 Minute Walk     Row Name 06/19/22 1443         6 Minute Walk   Phase Initial     Distance 355 feet     Walk Time 3.03 minutes     # of Rest Breaks 2  25 sec, 17 sec, stopped at 3:44     MPH 1.33     METS 0.96     RPE 15     Perceived Dyspnea  2     VO2 Peak 3.38     Symptoms Yes (comment)     Comments hip pain 6/10, back pain 6/10, SOB, fatigue     Resting HR 62 bpm     Resting BP 146/64     Resting Oxygen Saturation  93 %     Exercise Oxygen Saturation  during 6 min walk 80 %     Max Ex. HR 74 bpm     Max Ex. BP 156/64     2 Minute Post BP 126/64       Interval HR   1 Minute HR 69     2 Minute HR 73     3 Minute HR 74     4 Minute HR 74  stopped at 3:44     2 Minute Post HR 60     Interval Heart Rate? Yes       Interval Oxygen   Interval Oxygen? Yes     Baseline Oxygen Saturation % 93 %  2L pulsed     1 Minute Oxygen Saturation % 89 %     1 Minute Liters of Oxygen 2 L  pulsed     2 Minute Oxygen Saturation % 83 %     2 Minute Liters of Oxygen 2 L     3 Minute Oxygen Saturation % 81 %     3 Minute Liters of Oxygen 3 L     4 Minute Oxygen Saturation % 80 %     4 Minute Liters of Oxygen 3 L     2 Minute Post Oxygen Saturation % 92 %     2 Minute Post Liters of Oxygen 3 L             Oxygen Initial Assessment:  Oxygen Initial Assessment - 07/04/22 1129       Home Oxygen   Home Oxygen Device Home Concentrator;E-Tanks    Sleep Oxygen Prescription Continuous    Liters per minute 3    Home Exercise Oxygen Prescription Pulsed    Liters per minute 3    Home Resting Oxygen Prescription Pulsed    Liters per minute 3    Compliance with Home Oxygen Use Yes      Program Oxygen Prescription   Program Oxygen Prescription Continuous;E-Tanks    Liters per minute 3      Intervention   Short Term Goals To learn and exhibit compliance with exercise, home and travel O2  prescription;To learn and understand importance of monitoring SPO2 with pulse oximeter and demonstrate accurate use of the pulse oximeter.;To learn and understand importance of maintaining oxygen saturations>88%;To learn and demonstrate proper pursed lip breathing techniques or other breathing techniques. ;To learn and demonstrate proper use of respiratory medications    Long  Term Goals Exhibits compliance with exercise, home  and travel O2 prescription;Verbalizes importance of monitoring SPO2 with pulse oximeter and return demonstration;Maintenance of O2 saturations>88%;Exhibits proper breathing techniques, such as pursed lip breathing or other method taught during  program session;Compliance with respiratory medication;Demonstrates proper use of MDI's             Oxygen Re-Evaluation:  Oxygen Re-Evaluation     Row Name 07/04/22 1129 08/01/22 1124 09/07/22 1128         Program Oxygen Prescription   Program Oxygen Prescription -- Continuous;E-Tanks Continuous;E-Tanks     Liters per minute -- 3 3       Home Oxygen   Home Oxygen Device -- Home Concentrator;E-Tanks Home Concentrator;E-Tanks     Sleep Oxygen Prescription -- Continuous Continuous     Liters per minute -- 3 3     Home Exercise Oxygen Prescription -- Continuous Continuous     Liters per minute -- 3 3     Home Resting Oxygen Prescription -- Continuous Continuous     Liters per minute -- 3 3     Compliance with Home Oxygen Use -- Yes Yes       Goals/Expected Outcomes   Short Term Goals -- To learn and exhibit compliance with exercise, home and travel O2 prescription;To learn and understand importance of monitoring SPO2 with pulse oximeter and demonstrate accurate use of the pulse oximeter.;To learn and understand importance of maintaining oxygen saturations>88%;To learn and demonstrate proper pursed lip breathing techniques or other breathing techniques. ;To learn and demonstrate proper use of respiratory medications To learn  and exhibit compliance with exercise, home and travel O2 prescription;To learn and understand importance of monitoring SPO2 with pulse oximeter and demonstrate accurate use of the pulse oximeter.;To learn and understand importance of maintaining oxygen saturations>88%;To learn and demonstrate proper pursed lip breathing techniques or other breathing techniques. ;To learn and demonstrate proper use of respiratory medications     Long  Term Goals -- Exhibits compliance with exercise, home  and travel O2 prescription;Verbalizes importance of monitoring SPO2 with pulse oximeter and return demonstration;Maintenance of O2 saturations>88%;Exhibits proper breathing techniques, such as pursed lip breathing or other method taught during program session;Compliance with respiratory medication;Demonstrates proper use of MDI's Exhibits compliance with exercise, home  and travel O2 prescription;Verbalizes importance of monitoring SPO2 with pulse oximeter and return demonstration;Maintenance of O2 saturations>88%;Exhibits proper breathing techniques, such as pursed lip breathing or other method taught during program session;Compliance with respiratory medication;Demonstrates proper use of MDI's     Comments Darius Norris continues to use his oxygen as directed. He is recovering from double pneumonia and feels much better than before. He continues to watch his O2 and knows to notify his doctor of any abnormal changes. Retierated PLB. He is staying compliant with his medications and denies having any questions on how to use or take his respiratory medications. Darius Norris is doing well with his oxygen therapy.  He had a visit from oxygen company today as they checked up on his equipment.  They said all was good.  He continues to use his PLB and nose strips to help with air flow.  His saturations have continued to do well when he checks at home. Darius Norris is doing well with his oxygen therapy. His breathing is not as good right now and chronic sinus  issues have been getting worse recently; he has an appointment Monday and would like to ask them for a chest x-ray as he is afraid he has pneumonia again; he had it before in June. He continues to use PLB and nose strips to help with shortness of breath and he feels it is helpful. He rpeorts his oxygen has been doing well when he checks it  at home.     Goals/Expected Outcomes Short: Continue to monitor O2 closely Long: Continue long-term compliance with PLB and symptom monitoring Short; Continue to use PLB to help with breathing Long: conitnued compliance Short: speak with doctor about increased shortness of breath Long: conitnued compliance              Oxygen Discharge (Final Oxygen Re-Evaluation):  Oxygen Re-Evaluation - 09/07/22 1128       Program Oxygen Prescription   Program Oxygen Prescription Continuous;E-Tanks    Liters per minute 3      Home Oxygen   Home Oxygen Device Home Concentrator;E-Tanks    Sleep Oxygen Prescription Continuous    Liters per minute 3    Home Exercise Oxygen Prescription Continuous    Liters per minute 3    Home Resting Oxygen Prescription Continuous    Liters per minute 3    Compliance with Home Oxygen Use Yes      Goals/Expected Outcomes   Short Term Goals To learn and exhibit compliance with exercise, home and travel O2 prescription;To learn and understand importance of monitoring SPO2 with pulse oximeter and demonstrate accurate use of the pulse oximeter.;To learn and understand importance of maintaining oxygen saturations>88%;To learn and demonstrate proper pursed lip breathing techniques or other breathing techniques. ;To learn and demonstrate proper use of respiratory medications    Long  Term Goals Exhibits compliance with exercise, home  and travel O2 prescription;Verbalizes importance of monitoring SPO2 with pulse oximeter and return demonstration;Maintenance of O2 saturations>88%;Exhibits proper breathing techniques, such as pursed lip breathing  or other method taught during program session;Compliance with respiratory medication;Demonstrates proper use of MDI's    Comments Darius Norris is doing well with his oxygen therapy. His breathing is not as good right now and chronic sinus issues have been getting worse recently; he has an appointment Monday and would like to ask them for a chest x-ray as he is afraid he has pneumonia again; he had it before in June. He continues to use PLB and nose strips to help with shortness of breath and he feels it is helpful. He rpeorts his oxygen has been doing well when he checks it at home.    Goals/Expected Outcomes Short: speak with doctor about increased shortness of breath Long: conitnued compliance             Initial Exercise Prescription:  Initial Exercise Prescription - 06/19/22 1400       Date of Initial Exercise RX and Referring Provider   Date 06/19/22    Referring Provider Delorise Jackson MD   Dr. Jenne Pane ([facility])     Oxygen   Oxygen Continuous    Liters 3    Maintain Oxygen Saturation 88% or higher      Recumbant Bike   Level 1    RPM 50    Watts 10    Minutes 15    METs 1.5      NuStep   Level 1    SPM 80    Minutes 15    METs 1.5      REL-XR   Level 1    Speed 50    Minutes 15    METs 1.5      T5 Nustep   Level 1    SPM 80    Minutes 15    METs 1.5      Biostep-RELP   Level 1    SPM 50    Minutes 15  METs 2      Track   Laps 5    Minutes 15    METs 1.27      Prescription Details   Frequency (times per week) 2    Duration Progress to 30 minutes of continuous aerobic without signs/symptoms of physical distress      Intensity   THRR 40-80% of Max Heartrate 97-132    Ratings of Perceived Exertion 11-13    Perceived Dyspnea 0-4      Progression   Progression Continue to progress workloads to maintain intensity without signs/symptoms of physical distress.      Resistance Training   Training Prescription Yes    Weight 3 kb    Reps 10-15              Perform Capillary Blood Glucose checks as needed.  Exercise Prescription Changes:   Exercise Prescription Changes     Row Name 06/19/22 1400 06/28/22 1200 07/12/22 1300 07/18/22 1400 07/27/22 1500     Response to Exercise   Blood Pressure (Admit) 146/64 116/64 128/64 -- 114/64   Blood Pressure (Exercise) 156/64 138/60 134/82 -- 132/82   Blood Pressure (Exit) 128/60 128/52 116/64 -- 122/64   Heart Rate (Admit) 62 bpm 62 bpm 67 bpm -- 56 bpm   Heart Rate (Exercise) 74 bpm 64 bpm 68 bpm -- 81 bpm   Heart Rate (Exit) 60 bpm 61 bpm 62 bpm -- 63 bpm   Oxygen Saturation (Admit) 93 % 95 % 90 % -- 94 %   Oxygen Saturation (Exercise) 80 % 96 % 96 % -- 88 %   Oxygen Saturation (Exit) 96 % 94 % 99 % -- 96 %   Rating of Perceived Exertion (Exercise) _0 -- 13   Perceived Dyspnea (Exercise) 2 -- 2 -- 1   Symptoms hip and back pain 6/10, SOB SOB SOB -- SOB   Comments walk test results second full day of exercise -- -- --   Duration -- Progress to 30 minutes of  aerobic without signs/symptoms of physical distress Continue with 30 min of aerobic exercise without signs/symptoms of physical distress. -- Continue with 30 min of aerobic exercise without signs/symptoms of physical distress.   Intensity -- THRR unchanged THRR unchanged -- THRR unchanged     Progression   Progression -- Continue to progress workloads to maintain intensity without signs/symptoms of physical distress. Continue to progress workloads to maintain intensity without signs/symptoms of physical distress. -- Continue to progress workloads to maintain intensity without signs/symptoms of physical distress.   Average METs -- 1.95 2 -- 2.06     Resistance Training   Training Prescription -- Yes Yes -- Yes   Weight -- 4 lb 4 lb -- 4 lb   Reps -- 10-15 10-15 -- 10-15     Interval Training   Interval Training -- No No -- No     Oxygen   Oxygen -- Continuous Continuous -- Continuous   Liters -- 3 3 -- 3      Treadmill   MPH -- 1 1.2 -- 1.4   Grade -- 0 0 -- 0   Minutes -- 15 15 -- 15   METs -- 1.77 1.92 -- 2.07     Recumbant Bike   Level -- -- 2 -- 2   Minutes -- -- 15 -- 15   METs -- -- -- -- 2.37     NuStep   Level -- 4 -- -- --   Minutes --  15 -- -- --   METs -- 2.1 -- -- --     REL-XR   Level -- 1 -- -- 3   Minutes -- 15 -- -- 15   METs -- -- -- -- 2.2     Biostep-RELP   Level -- 1 -- -- --   Minutes -- 15 -- -- --   METs -- 2 -- -- --     Home Exercise Plan   Plans to continue exercise at -- -- -- Home (comment)  Treadmill for Walking; Hand Weights Home (comment)  Treadmill for Walking; Hand Weights   Frequency -- -- -- Add 3 additional days to program exercise sessions. Add 3 additional days to program exercise sessions.   Initial Home Exercises Provided -- -- -- 07/18/22 07/18/22     Oxygen   Maintain Oxygen Saturation -- 88% or higher 88% or higher 88% or higher 88% or higher    Row Name 09/07/22 1500 09/19/22 1400           Response to Exercise   Blood Pressure (Admit) 118/64 128/60      Blood Pressure (Exit) 124/62 118/58      Heart Rate (Admit) 60 bpm 74 bpm      Heart Rate (Exercise) 70 bpm 82 bpm      Heart Rate (Exit) 61 bpm 64 bpm      Oxygen Saturation (Admit) 95 % 91 %      Oxygen Saturation (Exercise) 94 % 89 %      Oxygen Saturation (Exit) 98 % 98 %      Rating of Perceived Exertion (Exercise) 12 13      Perceived Dyspnea (Exercise) 2 3      Symptoms SOB SOB      Duration Continue with 30 min of aerobic exercise without signs/symptoms of physical distress. Continue with 30 min of aerobic exercise without signs/symptoms of physical distress.      Intensity THRR unchanged THRR unchanged        Progression   Progression Continue to progress workloads to maintain intensity without signs/symptoms of physical distress. Continue to progress workloads to maintain intensity without signs/symptoms of physical distress.      Average METs 2.34 2.53         Resistance Training   Training Prescription Yes Yes      Weight 4 lb 4 lb      Reps 10-15 10-15        Interval Training   Interval Training No No        Oxygen   Oxygen Continuous Continuous      Liters 3 3        Treadmill   MPH 1.3 1.8      Grade 0 0      Minutes 15 15      METs 1.99 2.38        Recumbant Bike   Level -- 3      Minutes -- 15      METs -- 2.66        REL-XR   Level 3 3      Minutes 15 15      METs 2.7 2.7        Home Exercise Plan   Plans to continue exercise at Home (comment)  Treadmill for Walking; Hand Weights Home (comment)  Treadmill for Walking; Hand Weights      Frequency Add 3 additional days to program exercise sessions. Add 3 additional  days to program exercise sessions.      Initial Home Exercises Provided 07/18/22 07/18/22        Oxygen   Maintain Oxygen Saturation 88% or higher 88% or higher               Exercise Comments:   Exercise Comments     Row Name 06/22/22 1046           Exercise Comments First full day of exercise!  Patient was oriented to gym and equipment including functions, settings, policies, and procedures.  Patient's individual exercise prescription and treatment plan were reviewed.  All starting workloads were established based on the results of the 6 minute walk test done at initial orientation visit.  The plan for exercise progression was also introduced and progression will be customized based on patient's performance and goals.                Exercise Goals and Review:   Exercise Goals     Row Name 06/19/22 1449             Exercise Goals   Increase Physical Activity Yes       Intervention Provide advice, education, support and counseling about physical activity/exercise needs.;Develop an individualized exercise prescription for aerobic and resistive training based on initial evaluation findings, risk stratification, comorbidities and participant's personal goals.       Expected Outcomes  Short Term: Attend rehab on a regular basis to increase amount of physical activity.;Long Term: Add in home exercise to make exercise part of routine and to increase amount of physical activity.;Long Term: Exercising regularly at least 3-5 days a week.       Increase Norris and Stamina Yes       Intervention Provide advice, education, support and counseling about physical activity/exercise needs.;Develop an individualized exercise prescription for aerobic and resistive training based on initial evaluation findings, risk stratification, comorbidities and participant's personal goals.       Expected Outcomes Short Term: Increase workloads from initial exercise prescription for resistance, speed, and METs.;Short Term: Perform resistance training exercises routinely during rehab and add in resistance training at home;Long Term: Improve cardiorespiratory fitness, muscular endurance and Norris as measured by increased METs and functional capacity (6MWT)       Able to understand and use rate of perceived exertion (RPE) scale Yes       Intervention Provide education and explanation on how to use RPE scale       Expected Outcomes Short Term: Able to use RPE daily in rehab to express subjective intensity level;Long Term:  Able to use RPE to guide intensity level when exercising independently       Able to understand and use Dyspnea scale Yes       Intervention Provide education and explanation on how to use Dyspnea scale       Expected Outcomes Short Term: Able to use Dyspnea scale daily in rehab to express subjective sense of shortness of breath during exertion;Long Term: Able to use Dyspnea scale to guide intensity level when exercising independently       Knowledge and understanding of Target Heart Rate Range (THRR) Yes       Intervention Provide education and explanation of THRR including how the numbers were predicted and where they are located for reference       Expected Outcomes Short Term: Able to  state/look up THRR;Short Term: Able to use daily as guideline for intensity in rehab;Long Term: Able to  use THRR to govern intensity when exercising independently       Able to check pulse independently Yes       Intervention Provide education and demonstration on how to check pulse in carotid and radial arteries.;Review the importance of being able to check your own pulse for safety during independent exercise       Expected Outcomes Short Term: Able to explain why pulse checking is important during independent exercise;Long Term: Able to check pulse independently and accurately       Understanding of Exercise Prescription Yes       Intervention Provide education, explanation, and written materials on patient's individual exercise prescription       Expected Outcomes Short Term: Able to explain program exercise prescription;Long Term: Able to explain home exercise prescription to exercise independently                Exercise Goals Re-Evaluation :  Exercise Goals Re-Evaluation     Row Name 06/22/22 1046 06/28/22 1241 07/04/22 1124 07/12/22 1322 07/27/22 1505     Exercise Goal Re-Evaluation   Exercise Goals Review -- Increase Physical Activity;Increase Norris and Stamina;Understanding of Exercise Prescription Increase Physical Activity;Increase Norris and Stamina;Understanding of Exercise Prescription Increase Physical Activity;Increase Norris and Stamina;Understanding of Exercise Prescription Increase Physical Activity;Increase Norris and Stamina;Understanding of Exercise Prescription   Comments Reviewed RPE and dyspnea scales, THR and program prescription with pt today.  Pt voiced understanding and was given a copy of goals to take home. Darius Norris has completed his first two full days of exercise again.  He was able to walk some.  We will conitnue to monitor his progress. Darius Norris just started rehab not too long ago and has not done any home exercise yet. He has a treadmill at home that he  plans to use. EP will go over home exercise with patient soon once he is further into the program. 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 We did review key points on home exercise: reviewing THR, monitoring HR & O2, PLB, monitoring intensity. Darius Norris is off to a good start in rehab.  He is up to 1.2 mph on the treadmill and prefers treadmill to track.  He is also using 4 lb weights.  We will continue to montior his progress. Darius Norris is doing well in rehab. He recently improved his overall average MET level to 2.06 METs. He also improved his speed on the treadmill to 1.4 mph. He has done well with 4 lb hand weights for resistance training as well. We will continue to monitor his progress in the program.   Expected Outcomes Short: Use RPE daily to regulate intensity. Long: Follow program prescription in THR. Short: Continue to monitor rehab regularly Long: Continue to follow program prescription Short: EP to go over home exercise Long: Exercise independently at home at appropriate prescription Short: Continue to increase workload on treadmill, try some incline Long: conitnue to improve stamina Short: Attempt to add incline on the treadmill while maintaining speed. Long: conitnue to improve Norris and stamina.    [place] Name 08/01/22 1116 09/07/22 1128 09/19/22 1409         Exercise Goal Re-Evaluation   Exercise Goals Review Increase Physical Activity;Increase Norris and Stamina;Understanding of Exercise Prescription Increase Physical Activity;Increase Norris and Stamina;Understanding of Exercise Prescription Increase Physical Activity;Increase Norris and Stamina;Understanding of Exercise Prescription     Comments Darius Norris is doing well in rehab.  He is moving around the house more, but not using his treadmill.  He  knows he needs to and will try to get back to it.  He does feel like his stamina is starting to improve.  He is still limited in his activity with his tailbone and hip. Darius Norris is not exercising much  at home right now, he tries to stay active around the house, but he is not using his treadmill. He is interested in the [place] - gave him information. Darius Norris continues to do well in rehab. He recently increased his overall average MET level to 2.53 METs. He also increased his workload on the treadmill to a speed of 1.8 mph and no incline. He also improved to level 3 on the recumebnt bike. We will continue to monitor his progress.     Expected Outcomes Short: Get back on treadmill again Long: Continue to improve stamina Short: Get back on treadmill again, check out [employer] Long: Continue to improve stamina Short: Continue to increase workloads and work towards more walking. Long: Continue to increase Norris and stamina.              Discharge Exercise Prescription (Final Exercise Prescription Changes):  Exercise Prescription Changes - 09/19/22 1400       Response to Exercise   Blood Pressure (Admit) 128/60    Blood Pressure (Exit) 118/58    Heart Rate (Admit) 74 bpm    Heart Rate (Exercise) 82 bpm    Heart Rate (Exit) 64 bpm    Oxygen Saturation (Admit) 91 %    Oxygen Saturation (Exercise) 89 %    Oxygen Saturation (Exit) 98 %    Rating of Perceived Exertion (Exercise) 13    Perceived Dyspnea (Exercise) 3    Symptoms SOB    Duration Continue with 30 min of aerobic exercise without signs/symptoms of physical distress.    Intensity THRR unchanged      Progression   Progression Continue to progress workloads to maintain intensity without signs/symptoms of physical distress.    Average METs 2.53      Resistance Training   Training Prescription Yes    Weight 4 lb    Reps 10-15      Interval Training   Interval Training No      Oxygen   Oxygen Continuous    Liters 3      Treadmill   MPH 1.8    Grade 0    Minutes 15    METs 2.38      Recumbant Bike   Level 3    Minutes 15    METs 2.66      REL-XR   Level 3    Minutes 15    METs 2.7      Home Exercise Plan    Plans to continue exercise at Home (comment)   Treadmill for Walking; Hand Weights   Frequency Add 3 additional days to program exercise sessions.    Initial Home Exercises Provided 07/18/22      Oxygen   Maintain Oxygen Saturation 88% or higher             Nutrition:  Target Goals: Understanding of nutrition guidelines, daily intake of sodium <1552m, cholesterol <2065m calories 30% from fat and 7% or less from saturated fats, daily to have 5 or more servings of fruits and vegetables.  Education: All About Nutrition: -Group instruction provided by verbal, written material, interactive activities, discussions, models, and posters to present general guidelines for heart healthy nutrition including fat, fiber, MyPlate, the role of sodium in heart healthy nutrition, utilization  of the nutrition label, and utilization of this knowledge for meal planning. Follow up email sent as well. Written material given at graduation. Flowsheet Row Pulmonary Rehab from 07/27/2022 in [facility] Cardiac and Pulmonary Rehab  Education need identified 06/19/22       Biometrics:  Pre Biometrics - 06/19/22 1451       Pre Biometrics   Height 5' 5.6" (1.666 m)    Weight 184 lb 4.8 oz (83.6 kg)    BMI (Calculated) 30.12    Single Leg Stand 0 seconds              Nutrition Therapy Plan and Nutrition Goals:  Nutrition Therapy & Goals - 09/07/22 1126       Nutrition Therapy   RD appointment deferred Yes      Personal Nutrition Goals   Nutrition Goal Darius Norris would not like to speak with RD at this time; will continue to follow up.             Nutrition Assessments:  MEDIFICTS Score Key: ?70 Need to make dietary changes  40-70 Heart Healthy Diet ? 40 Therapeutic Level Cholesterol Diet  Flowsheet Row Pulmonary Rehab from 06/19/2022 in [facility] Cardiac and Pulmonary Rehab  Picture Your Plate Total Score on Admission 52      Picture Your Plate Scores: <01 Unhealthy dietary pattern with much room  for improvement. 41-50 Dietary pattern unlikely to meet recommendations for good health and room for improvement. 51-60 More healthful dietary pattern, with some room for improvement.  >60 Healthy dietary pattern, although there may be some specific behaviors that could be improved.   Nutrition Goals Re-Evaluation:  Nutrition Goals Re-Evaluation     [place] Name 07/04/22 1159 08/01/22 1120           Goals   Nutrition Goal -- Darius Norris would not like to speak with RD at this time; will continue to follow up.      Comment Patient has yet to meet with the RD at this time. He is not sure if he would like to make an appointment, Darius Norris is doesn't want to meet with RD.  He is sticking to lean proteins.  He likes beans and potatoes.  His favorite is catelope especially during this time of the year.  He enjoys fruits like grapes too throughout the year.  He continues to work on portion control      Expected Outcome Short: Schedule appt with RD if interested Long: Follow healthy pulmonary based diet Short: Continue to work on portion control Long: continue to add in more vegetables               Nutrition Goals Discharge (Final Nutrition Goals Re-Evaluation):  Nutrition Goals Re-Evaluation - 08/01/22 [place] would not like to speak with RD at this time; will continue to follow up.    Comment Darius Norris is doesn't want to meet with RD.  He is sticking to lean proteins.  He likes beans and potatoes.  His favorite is catelope especially during this time of the year.  He enjoys fruits like grapes too throughout the year.  He continues to work on portion control    Expected Outcome Short: Continue to work on portion control Long: continue to add in more vegetables             Psychosocial: Target Goals: Acknowledge presence or absence of significant depression and/or stress, maximize coping skills, provide positive  support system. Participant is able to verbalize types and  ability to use techniques and skills needed for reducing stress and depression.   Education: Stress, Anxiety, and Depression - Group verbal and visual presentation to define topics covered.  Reviews how body is impacted by stress, anxiety, and depression.  Also discusses healthy ways to reduce stress and to treat/manage anxiety and depression.  Written material given at graduation. Flowsheet Row Pulmonary Rehab from 11/03/2021 in [facility] Cardiac and Pulmonary Rehab  Date 07/28/21  Educator [facility]  Instruction Review Code 1- [employer] Understanding       Education: Sleep Hygiene -Provides group verbal and written instruction about how sleep can affect your health.  Define sleep hygiene, discuss sleep cycles and impact of sleep habits. Review good sleep hygiene tips.    Initial Review & Psychosocial Screening:  Initial Psych Review & Screening - 05/08/22 1044       Initial Review   Current issues with Current Sleep Concerns;Current Psychotropic Meds;Current Depression;Current Anxiety/Panic      Family Dynamics   Good Support System? Yes    Comments Darius Norris states that his health makes his life stressful. He has been having trouble sleeping. He takes medication for his depression and anxiety.He can look to his wife and his two sisters for support.      Barriers   Psychosocial barriers to participate in program There are no identifiable barriers or psychosocial needs.;The patient should benefit from training in stress management and relaxation.      Screening Interventions   Interventions Encouraged to exercise;To provide support and resources with identified psychosocial needs;Provide feedback about the scores to participant    Expected Outcomes Short Term goal: Utilizing psychosocial counselor, staff and physician to assist with identification of specific Stressors or current issues interfering with healing process. Setting desired goal for each stressor or current issue identified.;Long Term  Goal: Stressors or current issues are controlled or eliminated.;Short Term goal: Identification and review with participant of any Quality of Life or Depression concerns found by scoring the questionnaire.;Long Term goal: The participant improves quality of Life and PHQ9 Scores as seen by post scores and/or verbalization of changes             Quality of Life Scores:  Scores of 19 and below usually indicate a poorer quality of life in these areas.  A difference of  2-3 points is a clinically meaningful difference.  A difference of 2-3 points in the total score of the Quality of Life Index has been associated with significant improvement in overall quality of life, self-image, physical symptoms, and general health in studies assessing change in quality of life.  PHQ-9: Review Flowsheet  More data exists      07/18/2022 06/19/2022 11/17/2021 06/13/2021 02/26/2019  Depression screen PHQ 2/9  Decreased Interest _0 0 1  Down, Depressed, Hopeless 0 1 0 0 0  PHQ - 2 Score _1 0 1  Altered sleeping _2 0 2  Tired, decreased energy _3 Change in appetite 0 0 1 0 1  Feeling bad or failure about yourself  0 0 0 0 0  Trouble concentrating 0 0 0 0 0  Moving slowly or fidgety/restless 0 0 0 0 0  Suicidal thoughts 0 0 0 0 0  PHQ-9 Score _4 Difficult doing work/chores Somewhat difficult Not difficult at all Somewhat difficult - Somewhat difficult   Interpretation of Total Score  Total Score Depression Severity:  1-4 = Minimal depression, 5-9 = Mild depression, 10-14 = Moderate depression, 15-19 = Moderately severe depression, 20-27 = Severe depression   Psychosocial Evaluation and Intervention:  Psychosocial Evaluation - 05/08/22 1046       Psychosocial Evaluation & Interventions   Interventions Encouraged to exercise with the program and follow exercise prescription;Stress management education;Relaxation education    Comments Darius Norris states that his health makes his life  stressful. He has been having trouble sleeping. He takes medication for his depression and anxiety.He can look to his wife and his two sisters for support.    Expected Outcomes Short: Exercise regularly to support mental health and notify staff of any changes. Long: maintain mental health and well being through teaching of rehab or prescribed medications independently.    Continue Psychosocial Services  Follow up required by staff             Psychosocial Re-Evaluation:  Psychosocial Re-Evaluation     [place] Name 07/04/22 1131 08/01/22 1117 09/07/22 1137         Psychosocial Re-Evaluation   Current issues with Current Sleep Concerns;Current Stress Concerns Current Sleep Concerns;Current Stress Concerns Current Sleep Concerns;Current Stress Concerns     Comments Darius Norris is just getting starting in rehab. He states his sleep is not great as he has trouble falling asleep, however, it has always been like this for a while. He takes melatonin to help but not too interested in taking something else. We talked about trying guided meditations and relaxation before bed. Discussed calm. com that has variation of meditation and deep breathing. He has some stress, but feels he tolerates it well. He is staying compliant with his medications and feels they are helping. Darius Norris is doing well in rehab. His hip and tailbone are still limiting his activity.  He bought a new mattress 3 months ago and he is ready to get rid of it as he is hurting more and his sheets don't fit.  He is upset that he spent so much money and it not working for him.  He has slats to support it as well as a box spring.  He is going to try to get rid of those.  His breathing is also limiting him in how much he is able to do.  He tries to use breathing to help focus and release stress. Darius Norris reports he is still having issues sleeping and his wife has been snoring which has been bothering him; he reports not usually being able to sleep well. Darius Norris  reports no major stress concerns, but his cat passed away last week - he continues to maintain a positive attitude. He talks to his wife, but doesn't feel like he has a good support system to rely on. He used to enjoy fishing, but he had to give that up with his continued shortness of breath. Darius Norris enjoys coming to rehab and feels it gives him some social interaction.     Expected Outcomes Short: Work on different relaxation method before bed Long: Maintain posititve attitude Short: Try working on mattress Long: continue to focus on positives Short: continue to attend rehab for mental health boost Long: continue to focus on positives     Interventions Encouraged to attend Pulmonary Rehabilitation for the exercise -- Encouraged to attend Pulmonary Rehabilitation for the exercise     Continue Psychosocial Services  Follow up required by staff -- Follow up required by staff  Psychosocial Discharge (Final Psychosocial Re-Evaluation):  Psychosocial Re-Evaluation - 09/07/22 1137       Psychosocial Re-Evaluation   Current issues with Current Sleep Concerns;Current Stress Concerns    Comments Darius Norris reports he is still having issues sleeping and his wife has been snoring which has been bothering him; he reports not usually being able to sleep well. Darius Norris reports no major stress concerns, but his cat passed away last week - he continues to maintain a positive attitude. He talks to his wife, but doesn't feel like he has a good support system to rely on. He used to enjoy fishing, but he had to give that up with his continued shortness of breath. Darius Norris enjoys coming to rehab and feels it gives him some social interaction.    Expected Outcomes Short: continue to attend rehab for mental health boost Long: continue to focus on positives    Interventions Encouraged to attend Pulmonary Rehabilitation for the exercise    Continue Psychosocial Services  Follow up required by staff              Education: Education Goals: Education classes will be provided on a weekly basis, covering required topics. Participant will state understanding/return demonstration of topics presented.  Learning Barriers/Preferences:  Learning Barriers/Preferences - 05/08/22 1042       Learning Barriers/Preferences   Learning Barriers None    Learning Preferences Group Instruction;Individual Instruction;Pictoral;Skilled Demonstration;Verbal Instruction;Video;Written Material             General Pulmonary Education Topics:  Infection Prevention: - Provides verbal and written material to individual with discussion of infection control including proper hand washing and proper equipment cleaning during exercise session. Flowsheet Row Pulmonary Rehab from 07/27/2022 in [facility] Cardiac and Pulmonary Rehab  Date 05/08/22  Educator [facility]  Instruction Review Code 1- Verbalizes Understanding       Falls Prevention: - Provides verbal and written material to individual with discussion of falls prevention and safety. Flowsheet Row Pulmonary Rehab from 07/27/2022 in [facility] Cardiac and Pulmonary Rehab  Date 05/08/22  Educator [facility]  Instruction Review Code 1- Verbalizes Understanding       Chronic Lung Disease Review: - Group verbal instruction with posters, models, PowerPoint presentations and videos,  to review new updates, new respiratory medications, new advancements in procedures and treatments. Providing information on websites and "800" numbers for continued self-education. Includes information about supplement oxygen, available portable oxygen systems, continuous and intermittent flow rates, oxygen safety, concentrators, and Medicare reimbursement for oxygen. Explanation of Pulmonary Drugs, including class, frequency, complications, importance of spacers, rinsing mouth after steroid MDI's, and proper cleaning methods for nebulizers. Review of basic lung anatomy and physiology related to function,  structure, and complications of lung disease. Review of risk factors. Discussion about methods for diagnosing sleep apnea and types of masks and machines for OSA. Includes a review of the use of types of environmental controls: home humidity, furnaces, filters, dust mite/pet prevention, HEPA vacuums. Discussion about weather changes, air quality and the benefits of nasal washing. Instruction on Warning signs, infection symptoms, calling MD promptly, preventive modes, and value of vaccinations. Review of effective airway clearance, coughing and/or vibration techniques. Emphasizing that all should Create an Action Plan. Written material given at graduation. Flowsheet Row Pulmonary Rehab from 11/03/2021 in [facility] Cardiac and Pulmonary Rehab  Date 09/22/21  Educator [facility]  Instruction Review Code 1- Verbalizes Understanding       AED/CPR: - Group verbal and written instruction with the use of models to demonstrate the  basic use of the AED with the basic ABC's of resuscitation. Flowsheet Row Pulmonary Rehab from 02/26/2019 in [facility] Cardiac and Pulmonary Rehab  Date 01/01/19  Educator [facility]  Instruction Review Code 1- Verbalizes Understanding        Anatomy and Cardiac Procedures: - Group verbal and visual presentation and models provide information about basic cardiac anatomy and function. Reviews the testing methods done to diagnose heart disease and the outcomes of the test results. Describes the treatment choices: Medical Management, Angioplasty, or Coronary Bypass Surgery for treating various heart conditions including Myocardial Infarction, Angina, Valve Disease, and Cardiac Arrhythmias.  Written material given at graduation.   Medication Safety: - Group verbal and visual instruction to review commonly prescribed medications for heart and lung disease. Reviews the medication, class of the drug, and side effects. Includes the steps to properly store meds and maintain the prescription regimen.  Written  material given at graduation. Flowsheet Row Pulmonary Rehab from 11/03/2021 in [facility] Cardiac and Pulmonary Rehab  Date 09/01/21  Educator [facility]  Instruction Review Code 1- Verbalizes Understanding       Other: -Provides group and verbal instruction on various topics (see comments)   Knowledge Questionnaire Score:  Knowledge Questionnaire Score - 06/19/22 1452       Knowledge Questionnaire Score   Pre Score 17/18              Core Components/Risk Factors/Patient Goals at Admission:  Personal Goals and Risk Factors at Admission - 06/19/22 1452       Core Components/Risk Factors/Patient Goals on Admission    Weight Management Yes;Weight Loss;Obesity    Intervention Weight Management: Develop a combined nutrition and exercise program designed to reach desired caloric intake, while maintaining appropriate intake of nutrient and fiber, sodium and fats, and appropriate energy expenditure required for the weight goal.;Weight Management: Provide education and appropriate resources to help participant work on and attain dietary goals.;Weight Management/Obesity: Establish reasonable short term and long term weight goals.;Obesity: Provide education and appropriate resources to help participant work on and attain dietary goals.    Admit Weight 184 lb 4.8 oz (83.6 kg)    Goal Weight: Short Term 180 lb (81.6 kg)    Goal Weight: Long Term 180 lb (81.6 kg)    Expected Outcomes Short Term: Continue to assess and modify interventions until short term weight is achieved;Long Term: Adherence to nutrition and physical activity/exercise program aimed toward attainment of established weight goal;Weight Loss: Understanding of general recommendations for a balanced deficit meal plan, which promotes 1-2 lb weight loss per week and includes a negative energy balance of [id number] kcal/d;Understanding of distribution of calorie intake throughout the day with the consumption of 4-5 meals/snacks;Understanding  recommendations for meals to include 15-35% energy as protein, 25-35% energy from fat, 35-60% energy from carbohydrates, less than 234m of dietary cholesterol, 20-35 gm of total fiber daily    Intervention Provide education, individualized exercise plan and daily activity instruction to help decrease symptoms of SOB with activities of daily living.    Expected Outcomes Short Term: Improve cardiorespiratory fitness to achieve a reduction of symptoms when performing ADLs;Long Term: Be able to perform more ADLs without symptoms or delay the onset of symptoms    Increase knowledge of respiratory medications and ability to use respiratory devices properly  Yes    Intervention Provide education and demonstration as needed of appropriate use of medications, inhalers, and oxygen therapy.    Expected Outcomes Short Term: Achieves understanding of medications use. Understands  that oxygen is a medication prescribed by physician. Demonstrates appropriate use of inhaler and oxygen therapy.;Long Term: Maintain appropriate use of medications, inhalers, and oxygen therapy.    Diabetes Yes    Intervention Provide education about signs/symptoms and action to take for hypo/hyperglycemia.;Provide education about proper nutrition, including hydration, and aerobic/resistive exercise prescription along with prescribed medications to achieve blood glucose in normal ranges: Fasting glucose 65-99 mg/dL    Expected Outcomes Short Term: Participant verbalizes understanding of the signs/symptoms and immediate care of hyper/hypoglycemia, proper foot care and importance of medication, aerobic/resistive exercise and nutrition plan for blood glucose control.;Long Term: Attainment of HbA1C < 7%.    Hypertension Yes    Intervention Provide education on lifestyle modifcations including regular physical activity/exercise, weight management, moderate sodium restriction and increased consumption of fresh fruit, vegetables, and low fat dairy,  alcohol moderation, and smoking cessation.;Monitor prescription use compliance.    Expected Outcomes Short Term: Continued assessment and intervention until BP is < 140/10m HG in hypertensive participants. < 130/86mHG in hypertensive participants with diabetes, heart failure or chronic kidney disease.;Long Term: Maintenance of blood pressure at goal levels.    Lipids Yes    Intervention Provide education and support for participant on nutrition & aerobic/resistive exercise along with prescribed medications to achieve LDL <7076mHDL >39m30m  Expected Outcomes Short Term: Participant states understanding of desired cholesterol values and is compliant with medications prescribed. Participant is following exercise prescription and nutrition guidelines.;Long Term: Cholesterol controlled with medications as prescribed, with individualized exercise RX and with personalized nutrition plan. Value goals: LDL < 70mg41mL > 40 mg.             Education:Diabetes - Individual verbal and written instruction to review signs/symptoms of diabetes, desired ranges of glucose level fasting, after meals and with exercise. Acknowledge that pre and post exercise glucose checks will be done for 3 sessions at entry of program. [facility]  Date 05/08/22  [facility] Code 1- Verbalizes Understanding       Know Your Numbers and Heart Failure: - Group verbal and visual instruction to discuss disease risk factors for cardiac and pulmonary disease and treatment options.  Reviews associated critical values for Overweight/Obesity, Hypertension, Cholesterol, and Diabetes.  Discusses basics of heart failure: signs/symptoms and treatments.  Introduces Heart Failure Zone chart for action plan for heart failure.  Written material given at graduation. Flowsheet Row [facility]  Date  07/27/22  Educator SB  Instruction Review Code 1- Verbalizes Understanding       Core Components/Risk Factors/Patient Goals Review:   Goals and Risk Factor Review     Row Name 07/04/22 1126 08/01/22 1121 09/07/22 1132         Core Components/Risk Factors/Patient Goals Review   Personal Goals Review Weight Management/Obesity;Diabetes;Hypertension;Improve shortness of breath with ADL's Weight Management/Obesity;Diabetes;Hypertension;Improve shortness of breath with ADL's;Increase knowledge of respiratory medications and ability to use respiratory devices properly. Diabetes;Improve shortness of breath with ADL's;Increase knowledge of respiratory medications and ability to use respiratory devices properly.;Hypertension     Review Mike Darius Norris started the program not too long ago. His doctor recently changed his BP medications about 3 weeks ago and has been feeling slightly dizzy from it. He reports BP stays stable and now low. Told him to let them know as soon as possible just in case they need to make changes.  He checks his blood sugars once/ week as what he was advised by the doctor and states it is always under 130.Marland Kitchen He has lost 30 lbs since last year which was intentional for the most part. He is looking to lose more, just reiterated to make sure he is not losing muscle mass. He stopped drinking regular soda and switched to diet which he thinks made a huge difference. He stays compliant with medications Darius Norris is doing well in rehab.  His weight is down some and his dieruetic is working well, he just watches his timing of it to make sure he is not out when it hits.  He can tell a difference when he misses his dose as his feet will swell.  [facility].  His pressures are doing well as are his sugars.  He continues to keep eye on them and takes his meds nights. His breathing is doing a little better.  He uses his inhaler routinely but only nebulizer when he  feels clogged.  He was encourged to use nebulizer more frequently to help. Darius Norris is doing well  in rehab. His breathing is not as good right now and chronic sinus issues have been getting worse recently; he has an appointment Monday and would like to ask them for a chest x-ray as he is afraid he has pneumonia again; he had it before in June. He continues to use PLB and nose strips to help with shortness of breath and he feels it is helpful. He rpeorts his oxygen has been doing well when he checks it at home. Darius Norris reports he saw his cardiologist last week and put him on a new medication for his Afib. He doesn't check his BP as often at home anymore- encouraged him to check it at least 2x/week at home. Darius Norris reports his BG has been running ok with metformin <130 fasting; he checks it weekly. He continues to take his medications as prescribed with no issues. Darius Norris rpeorts also having foot pain right now and trouble with his balance.     Expected Outcomes Short: Monitor BP closely and talk to doctor about dizziness Long: Continue to manage lifestyle risk factors Short: Try to use nebulizer more frequently Long: conitnue to work on weight loss Short: See MD Monday  Long: conitnue to work on weight loss              Core Components/Risk Factors/Patient Goals at Discharge (Final Review):   Goals and Risk Factor Review - 09/07/22 1132       Core Components/Risk Factors/Patient Goals Review   Personal Goals Review Diabetes;Improve shortness of breath with ADL's;Increase knowledge of respiratory medications and ability to use respiratory devices properly.;Hypertension    Review Darius Norris is doing well  in rehab. His breathing is not as good right now and chronic sinus issues have been getting worse recently; he has an appointment Monday and would like to ask them for a chest x-ray as he is afraid he has pneumonia again; he had it before in June. He continues to use PLB and nose strips to help with shortness of breath  and he feels it is helpful. He rpeorts his oxygen has been doing well when he checks it at home. Darius Norris reports he saw his cardiologist last week and put him on a new medication for his Afib. He doesn't check his BP as often at home anymore- encouraged him to check it at least 2x/week  at home. Darius Norris reports his BG has been running ok with metformin <130 fasting; he checks it weekly. He continues to take his medications as prescribed with no issues. Darius Norris rpeorts also having foot pain right now and trouble with his balance.    Expected Outcomes Short: See MD Monday  Long: conitnue to work on weight loss             ITP Comments:  ITP Comments     Row Name 05/08/22 1041 06/19/22 1443 06/22/22 1045 06/28/22 1024 07/26/22 0804   ITP Comments Virtual Visit completed. Patient informed on EP and RD appointment and 6 Minute walk test. Patient also informed of patient health questionnaires on My Chart. Patient Verbalizes understanding. Visit diagnosis can be found in [facility], patient is [place]. Completed 6MWT and gym orientation. Initial ITP created and sent for review to Dr. Zetta Bills, Medical Director. First full day of exercise!  Patient was oriented to gym and equipment including functions, settings, policies, and procedures.  Patient's individual exercise prescription and treatment plan were reviewed.  All starting workloads were established based on the results of the 6 minute walk test done at initial orientation visit.  The plan for exercise progression was also introduced and progression will be customized based on patient's performance and goals. 30 Day review completed. Medical Director ITP review done, changes made as directed, and signed approval by Medical Director.   NEW 30 Day review completed. Medical Director ITP review done, changes made as directed, and signed approval by Medical Director.    [place] Name 08/23/22 1338 09/20/22 0743         ITP Comments 30 Day review completed. Medical  Director ITP review done, changes made as directed, and signed approval by Medical Director. 30 Day review completed. Medical Director ITP review done, changes made as directed, and signed approval by Medical Director.               Comments:

## 2022-09-21 ENCOUNTER — Encounter: Payer: No Typology Code available for payment source | Attending: Anesthesiology

## 2022-09-21 DIAGNOSIS — R0602 Shortness of breath: Secondary | ICD-10-CM | POA: Insufficient documentation

## 2022-09-21 DIAGNOSIS — J449 Chronic obstructive pulmonary disease, unspecified: Secondary | ICD-10-CM | POA: Insufficient documentation

## 2022-09-28 ENCOUNTER — Telehealth: Payer: Self-pay

## 2022-09-28 ENCOUNTER — Encounter: Payer: No Typology Code available for payment source | Admitting: *Deleted

## 2022-09-28 NOTE — Telephone Encounter (Signed)
Attempted to call patient regarding Pulmonary Rehab, left message.

## 2022-10-03 ENCOUNTER — Encounter: Payer: No Typology Code available for payment source | Admitting: *Deleted

## 2022-10-03 DIAGNOSIS — J449 Chronic obstructive pulmonary disease, unspecified: Secondary | ICD-10-CM | POA: Diagnosis present

## 2022-10-03 DIAGNOSIS — R0602 Shortness of breath: Secondary | ICD-10-CM | POA: Diagnosis present

## 2022-10-03 NOTE — Progress Notes (Signed)
Daily Session Note  Patient Details  Name: Darius Norris MRN: [id number] Date of Birth: Feb 12, 1951 Referring Provider:   Flowsheet Row Pulmonary Rehab from 06/19/2022 in [facility] Cardiac and Pulmonary Rehab  Referring Provider Delorise Jackson MD  [Dr. Jen[facility] Pane ([facility])]       Encounter Date: 10/03/2022  Check In:  Session Check In - 10/03/22 1115       Check-In   Supervising physician immediately available to respond to emergencies See telemetry face sheet for immediately available ER MD    Location [facility]-Cardiac & Pulmonary Rehab    Staff Present Coralie Keens, MS, ASCM CEP, Exercise Physiologist;Jessica Luan Pulling, MA, RCEP, CCRP, CCET;Melissa [place], RDN, LDN;Frankye Schwegel Sherryll Burger, RN BSN    Virtual Visit No    Medication changes reported     No    Fall or balance concerns reported    No    Warm-up and Cool-down Performed on first and last piece of equipment    Resistance Training Performed Yes    VAD Patient? No    PAD/SET Patient? No      Pain Assessment   Currently in Pain? No/denies                Social History   Tobacco Use  Smoking Status Former   Packs/day: 1.00   Years: 30.00   Total pack years: 30.00   Types: Cigarettes   Quit date: 12/18/2001   Years since quitting: 20.8  Smokeless Tobacco Never    Goals Met:  Independence with exercise equipment Exercise tolerated well No report of concerns or symptoms today Strength training completed today  Goals Unmet:  Not Applicable  Comments: Pt able to follow exercise prescription today without complaint.  Will continue to monitor for progression.    Dr. Emily Filbert is Medical Director for [place].  Dr. Ottie Glazier is Medical Director for [facility] Pulmonary Rehabilitation.

## 2022-10-05 ENCOUNTER — Encounter: Payer: No Typology Code available for payment source | Admitting: *Deleted

## 2022-10-05 DIAGNOSIS — J449 Chronic obstructive pulmonary disease, unspecified: Secondary | ICD-10-CM | POA: Diagnosis not present

## 2022-10-05 DIAGNOSIS — R0602 Shortness of breath: Secondary | ICD-10-CM

## 2022-10-05 NOTE — Progress Notes (Signed)
Daily Session Note  Patient Details  Name: Darius Norris MRN: [id number] Date of Birth: [birth year] Referring Provider:   Flowsheet Row Pulmonary Rehab from 06/19/2022 in [facility] Cardiac and Pulmonary Rehab  Referring Provider Delorise Jackson MD  [Dr. Jenne Pane ([facility])]       Encounter Date: 10/05/2022  Check In:  Session Check In - 10/05/22 1132       Check-In   Supervising physician immediately available to respond to emergencies See telemetry face sheet for immediately available ER MD    Location [facility]-Card[facility] & Pulmonary Rehab    Staff Present Coralie Keens, MS, ASCM CEP, Exercise Physiologist;Jessica Luan Pulling, MA, RCEP, CCRP, CCET;Nylene Inlow Sherryll Burger, RN BSN    Virtual Visit No    Medication changes reported     No    Fall or balance concerns reported    No    Warm-up and Cool-down Performed on first and last piece of equipment    Resistance Training Performed Yes    VAD Patient? No    PAD/SET Patient? No      Pain Assessment   Currently in Pain? No/denies                Social History   Tobacco Use  Smoking Status Former   Packs/day: 1.00   Years: 30.00   Total pack years: 30.00   Types: Cigarettes   Quit date: 12/18/2001   Years since quitting: 20.8  Smokeless Tobacco Never    Goals Met:  Independence with exercise equipment Exercise tolerated well No report of concerns or symptoms today Strength training completed today  Goals Unmet:  Not Applicable  Comments: Pt able to follow exercise prescription today without complaint.  Will continue to monitor for progression.    Dr. Emily Filbert is Medical Director for [employer].  Dr. Ottie Glazier is Medical Director for [facility] [place] [facility] Pulmonary Rehabilitation.

## 2022-10-10 ENCOUNTER — Encounter: Payer: No Typology Code available for payment source | Admitting: *Deleted

## 2022-10-10 DIAGNOSIS — J449 Chronic obstructive pulmonary disease, unspecified: Secondary | ICD-10-CM

## 2022-10-10 NOTE — Progress Notes (Signed)
Daily Session Note  Patient Details  Name: Darius Norris MRN: [id number] Date of Birth: Dec 12, 1951 Referring Provider:   Flowsheet Row Pulmonary Rehab from 06/19/2022 in [facility] Cardiac and Pulmonary Rehab  Referring Provider Delorise Jackson MD  [Dr. Jenne Pane ([facility])]       Encounter Date: 10/10/2022  Check In:  Session Check In - 10/10/22 1115       Check-In   Supervising physician immediately available to respond to emergencies See telemetry face sheet for immediately available ER MD    Location [facility]-Cardiac & Pulmonary Rehab    Staff Present Coralie Keens, MS, ASCM CEP, Exercise Physiologist;Jessica Luan Pulling, MA, RCEP, CCRP, CCET;Jamiee Milholland Tamala Julian, RN, Dimple Nanas, BS, Exercise Physiologist    Virtual Visit No    Medication changes reported     No    Fall or balance concerns reported    No    Warm-up and Cool-down Performed on first and last piece of equipment    Resistance Training Performed Yes    VAD Patient? No    PAD/SET Patient? No      Pain Assessment   Currently in Pain? No/denies                Social History   Tobacco Use  Smoking Status Former   Packs/day: 1.00   Years: 30.00   Total pack years: 30.00   Types: Cigarettes   Quit date: 12/18/2001   Years since quitting: 20.8  Smokeless Tobacco Never    Goals Met:  Independence with exercise equipment Exercise tolerated well No report of concerns or symptoms today Strength training completed today  Goals Unmet:  Not Applicable  Comments: Pt able to follow exercise prescription today without complaint.  Will continue to monitor for progression.    Dr. Emily Filbert is Medical Director for [place].  Dr. Ottie Glazier is Medical Director for [facility] Pulmonary Rehabilitation.

## 2022-10-12 ENCOUNTER — Encounter: Payer: No Typology Code available for payment source | Admitting: *Deleted

## 2022-10-12 DIAGNOSIS — J449 Chronic obstructive pulmonary disease, unspecified: Secondary | ICD-10-CM

## 2022-10-12 NOTE — Progress Notes (Signed)
Daily Session Note  Patient Details  Name: Darius Norris MRN: [id number] Date of Birth: 11-16-1951 Referring Provider:   Flowsheet Row Pulmonary Rehab from 06/19/2022 in [facility] Cardiac and Pulmonary Rehab  Referring Provider Delorise Jackson MD  [Dr. Jenne Pane ([facility])]       Encounter Date: 10/12/2022  Check In:  Session Check In - 10/12/22 1115       Check-In   Supervising physician immediately available to respond to emergencies See telemetry face sheet for immediately available ER MD    Location [facility]-Cardiac & Pulmonary Rehab    Staff Present Renita Papa, RN BSN;Ayat Drenning Tamala Julian, RN, Terie Purser, RCP,RRT,BSRT    Virtual Visit No    Medication changes reported     No    Fall or balance concerns reported    No    Warm-up and Cool-down Performed on first and last piece of equipment    Resistance Training Performed Yes    VAD Patient? No    PAD/SET Patient? No      Pain Assessment   Currently in Pain? No/denies                Social History   Tobacco Use  Smoking Status Former   Packs/day: 1.00   Years: 30.00   Total pack years: 30.00   Types: Cigarettes   Quit date: 12/18/2001   Years since quitting: 20.8  Smokeless Tobacco Never    Goals Met:  Independence with exercise equipment Exercise tolerated well No report of concerns or symptoms today Strength training completed today  Goals Unmet:  Not Applicable  Comments: Pt able to follow exercise prescription today without complaint.  Will continue to monitor for progression.    Dr. Emily Filbert is Medical Director for [place].  Dr. Ottie Glazier is Medical Director for [facility] Pulmonary Rehabilitation.

## 2022-10-17 ENCOUNTER — Encounter: Payer: No Typology Code available for payment source | Admitting: *Deleted

## 2022-10-17 DIAGNOSIS — J449 Chronic obstructive pulmonary disease, unspecified: Secondary | ICD-10-CM | POA: Diagnosis not present

## 2022-10-17 NOTE — Progress Notes (Signed)
Daily Session Note  Patient Details  Name: Darius Norris MRN: [id number] Date of Birth: 12/12/1951 Referring Provider:   Flowsheet Row Pulmonary Rehab from 06/19/2022 in [facility] Cardiac and Pulmonary Rehab  Referring Provider Luccarelli, [name] MD  [Dr. Mary Koethe ([facility])]       Encounter Date: 10/17/2022  Check In:  Session Check In - 10/17/22 1057       Check-In   Supervising physician immediately available to respond to emergencies See telemetry face sheet for immediately available ER MD    Location [facility]-Cardiac & Pulmonary Rehab    Staff Present  , RN BSN;Jessica Hawkins, MA, RCEP, CCRP, CCET;Kara Langdon, MS, ASCM CEP, Exercise Physiologist;Noah Tickle, BS, Exercise Physiologist    Virtual Visit No    Medication changes reported     No    Fall or balance concerns reported    No    Warm-up and Cool-down Performed on first and last piece of equipment    Resistance Training Performed Yes    VAD Patient? No    PAD/SET Patient? No      Pain Assessment   Currently in Pain? No/denies                Social History   Tobacco Use  Smoking Status Former   Packs/day: 1.00   Years: 30.00   Total pack years: 30.00   Types: Cigarettes   Quit date: 12/18/2001   Years since quitting: 20.8  Smokeless Tobacco Never    Goals Met:  Independence with exercise equipment Exercise tolerated well No report of concerns or symptoms today Strength training completed today  Goals Unmet:  Not Applicable  Comments: Pt able to follow exercise prescription today without complaint.  Will continue to monitor for progression.    Dr. Mark Miller is Medical Director for [facility].  Dr. Fuad Aleskerov is Medical Director for [name] Pulmonary Rehabilitation. 

## 2022-10-18 ENCOUNTER — Encounter: Payer: Self-pay | Admitting: *Deleted

## 2022-10-18 DIAGNOSIS — J449 Chronic obstructive pulmonary disease, unspecified: Secondary | ICD-10-CM

## 2022-10-18 NOTE — Progress Notes (Signed)
Pulmonary Individual Treatment Plan  Patient Details  Name: Darius Norris MRN: [id number] Date of Birth: 1951-04-09 Referring Provider:   Flowsheet Row Pulmonary Rehab from 06/19/2022 in [facility] Cardiac and Pulmonary Rehab  Referring Provider Delorise Jackson MD  [Dr. Jenne Pane ([facility])]       Initial Encounter Date:  Flowsheet Row Pulmonary Rehab from 06/19/2022 in [facility] Cardiac and Pulmonary Rehab  Date 06/19/22       Visit Diagnosis: Chronic obstructive pulmonary disease, unspecified COPD type ([place])  Patient's Home Medications on Admission:  Current Outpatient Medications:    acetaminophen (TYLENOL) 325 MG tablet, Take 650 mg by mouth every 6 (six) hours as needed., Disp: , Rfl:    albuterol (VENTOLIN HFA) 108 (90 Base) MCG/ACT inhaler, Inhale into the lungs., Disp: , Rfl:    aspirin EC 81 MG EC tablet, Take 1 tablet (81 mg total) by mouth daily., Disp: 180 tablet, Rfl: 0   atorvastatin (LIPITOR) 80 MG tablet, Take 1 tablet (80 mg total) by mouth at bedtime., Disp: 30 tablet, Rfl: 0   budesonide-formoterol (SYMBICORT) 160-4.5 MCG/ACT inhaler, Inhale 2 puffs into the lungs 2 (two) times daily., Disp: , Rfl:    busPIRone (BUSPAR) 10 MG tablet, Take by mouth., Disp: , Rfl:    clobetasol (TEMOVATE) 0.05 % external solution, Apply topically., Disp: , Rfl:    dabigatran (PRADAXA) 150 MG CAPS capsule, Take 150 mg by mouth 2 (two) times daily., Disp: , Rfl:    diltiazem (CARDIZEM CD) 300 MG 24 hr capsule, Take 1 capsule (300 mg total) by mouth daily., Disp: 30 capsule, Rfl: 2   doxycycline (VIBRAMYCIN) 100 MG capsule, Take 1 capsule (100 mg total) by mouth 2 (two) times daily., Disp: 14 capsule, Rfl: 0   furosemide (LASIX) 40 MG tablet, Take 1 tablet (40 mg total) by mouth daily., Disp: 30 tablet, Rfl: 2   gabapentin (NEURONTIN) 300 MG capsule, Take 1 capsule by mouth 3 (three) times daily as needed., Disp: , Rfl:    guaifenesin (HUMIBID E) 400 MG TABS tablet, Take by mouth., Disp: ,  Rfl:    hydrocortisone 2.5 % cream, Apply topically., Disp: , Rfl:    hydrOXYzine (ATARAX/VISTARIL) 25 MG tablet, Take by mouth., Disp: , Rfl:    Ipratropium-Albuterol (COMBIVENT) 20-100 MCG/ACT AERS respimat, Inhale 1 puff into the lungs every 6 (six) hours as needed for wheezing or shortness of breath., Disp: , Rfl:    isosorbide mononitrate (IMDUR) 60 MG 24 hr tablet, Take 60 mg by mouth daily., Disp: , Rfl:    ketorolac (ACULAR) 0.5 % ophthalmic solution, INSTILL 1 DROP OPERATIVE EYE FOUR TIMES A DAY FOR POSTOPERATIVE PAIN/INFLAMMATION SEND TO 4B AMB SURGERY OR ON DAY OF SURGERY 04/05/22, Disp: , Rfl:    lidocaine (XYLOCAINE) 5 % ointment, Apply topically., Disp: , Rfl:    loratadine (CLARITIN) 10 MG tablet, Take 10 mg by mouth daily as needed for allergies. , Disp: , Rfl:    Melatonin 3 MG CAPS, Take 2 capsules by mouth at bedtime., Disp: , Rfl:    metFORMIN (GLUCOPHAGE-XR) 500 MG 24 hr tablet, Take 500 mg by mouth in the morning and at bedtime., Disp: , Rfl:    methotrexate (RHEUMATREX) 2.5 MG tablet, Take 10 mg by mouth once a week., Disp: , Rfl:    metoprolol tartrate (LOPRESSOR) 100 MG tablet, Take 1 tablet (100 mg total) by mouth 2 (two) times daily., Disp: 60 tablet, Rfl: 2   mometasone (ASMANEX) 220 MCG/INH inhaler, Take by mouth.,  Disp: , Rfl:    moxifloxacin (VIGAMOX) 0.5 % ophthalmic solution, INSTILL 1 DROP IN LEFT EYE FOUR TIMES A DAY FOR CATARACT SURGERY SEND TO 4B AMB SURGERY OR ON DAY OF SURGERY 04/05/22, Disp: , Rfl:    predniSONE (STERAPRED UNI-PAK 21 TAB) 10 MG (21) TBPK tablet, Per packaging instructions, Disp: 21 each, Rfl: 0   senna (SENOKOT) 8.6 MG TABS tablet, Take 1 tablet by mouth daily as needed for mild constipation., Disp: , Rfl:    simethicone (MYLICON) 80 MG chewable tablet, CHEW ONE TABLET BY MOUTH THREE TIMES A DAY AFTER MEALS AS NEEDED FOR GAS, Disp: , Rfl:   Past Medical History: Past Medical History:  Diagnosis Date   A-fib ([place])    CAD (coronary artery  disease)    Colitis    COPD (chronic obstructive pulmonary disease) (HCC)    Emphysema lung (HCC)    GERD (gastroesophageal reflux disease)    Hypertension    MI (myocardial infarction) ([place]) 1999    Tobacco Use: Social History   Tobacco Use  Smoking Status Former   Packs/day: 1.00   Years: 30.00   Total pack years: 30.00   Types: Cigarettes   Quit date: 12/18/2001   Years since quitting: 20.8  Smokeless Tobacco Never    Labs: Review Flowsheet  More data may exist      Latest Ref Rng & Units 02/04/2013 09/04/2016 05/02/2018 01/05/2019 12/06/2021  Labs for ITP Cardiac and Pulmonary Rehab  Cholestrol 0 - 200 mg/dL 185  116  - - 106   LDL (calc) 0 - 99 mg/dL 114  51  - - 51   HDL-C >40 mg/dL 38  42  - - 33   Trlycerides <150 mg/dL 166  116  - - 108   Hemoglobin A1c 4.8 - 5.6 % - 6.4  - - 5.9   Bicarbonate 20.0 - 28.0 mmol/L - - 36.3  29.6  -  O2 Saturation % - - - 81.7  -     Pulmonary Assessment Scores:  Pulmonary Assessment Scores     Row Name 06/19/22 1455         ADL UCSD   ADL Phase Entry     SOB Score total 49     Rest 0     Walk 2     Stairs 4     Bath 3     Dress 1     Shop 2       CAT Score   CAT Score 17       mMRC Score   mMRC Score 2              UCSD: Self-administered rating of dyspnea associated with activities of daily living (ADLs) 6-point scale (0 = "not at all" to 5 = "maximal or unable to do because of breathlessness")  Scoring Scores range from 0 to 120.  Minimally important difference is 5 units  CAT: CAT can identify the health impairment of COPD patients and is better correlated with disease progression.  CAT has a scoring range of zero to 40. The CAT score is classified into four groups of low (less than 10), medium (10 - 20), high (21-30) and very high (31-40) based on the impact level of disease on health status. A CAT score over 10 suggests significant symptoms.  A worsening CAT score could be explained by an exacerbation,  poor medication adherence, poor inhaler technique, or progression of COPD or comorbid conditions.  CAT  MCID is 2 points  mMRC: mMRC (Modified Medical Research Council) Dyspnea Scale is used to assess the degree of baseline functional disability in patients of respiratory disease due to dyspnea. No minimal important difference is established. A decrease in score of 1 point or greater is considered a positive change.   Pulmonary Function Assessment:  Pulmonary Function Assessment - 06/19/22 1455       Breath   Shortness of Breath Yes;Fear of Shortness of Breath;Limiting activity             Exercise Target Goals: Exercise Program Goal: Individual exercise prescription set using results from initial 6 min walk test and THRR while considering  patient's activity barriers and safety.   Exercise Prescription Goal: Initial exercise prescription builds to 30-45 minutes a day of aerobic activity, 2-3 days per week.  Home exercise guidelines will be given to patient during program as part of exercise prescription that the participant will acknowledge.  Education: Aerobic Exercise: - Group verbal and visual presentation on the components of exercise prescription. Introduces F.I.T.T principle from ACSM for exercise prescriptions.  Reviews F.I.T.T. principles of aerobic exercise including progression. Written material given at graduation. Flowsheet Row Pulmonary Rehab from 11/03/2021 in [facility] Card[facility] and Pulmonary Rehab  Date 10/13/21  Educator [facility]  Instruction Review Code 1- Verbalizes Understanding       Education: Resistance Exercise: - Group verbal and visual presentation on the components of exercise prescription. Introduces F.I.T.T principle from ACSM for exercise prescriptions  Reviews F.I.T.T. principles of resistance exercise including progression. Written material given at graduation.    Education: Exercise & Equipment Safety: - Individual verbal instruction and demonstration of  equipment use and safety with use of the equipment. Flowsheet Row Pulmonary Rehab from 07/27/2022 in [facility] Cardiac and Pulmonary Rehab  Date 05/08/22  Educator [facility]  Instruction Review Code 1- Verbalizes Understanding       Education: Exercise Physiology & General Exercise Guidelines: - Group verbal and written instruction with models to review the exercise physiology of the cardiovascular system and associated critical values. Provides general exercise guidelines with specific guidelines to those with heart or lung disease.  Flowsheet Row Pulmonary Rehab from 11/03/2021 in [facility] Cardiac and Pulmonary Rehab  Date 08/04/21  Educator [facility]  Instruction Review Code 1- Verbalizes Understanding       Education: Flexibility, Balance, Mind/Body Relaxation: - Group verbal and visual presentation with interactive activity on the components of exercise prescription. Introduces F.I.T.T principle from ACSM for exercise prescriptions. Reviews F.I.T.T. principles of flexibility and balance exercise training including progression. Also discusses the mind body connection.  Reviews various relaxation techniques to help reduce and manage stress (i.e. Deep breathing, progressive muscle relaxation, and visualization). Balance handout provided to take home. Written material given at graduation. Flowsheet Row Pulmonary Rehab from 11/03/2021 in [facility] Cardiac and Pulmonary Rehab  Date 10/27/21  Educator AS  Instruction Review Code 1- Verbalizes Understanding       Activity Barriers & Risk Stratification:  Activity Barriers & Cardiac Risk Stratification - 06/19/22 1445       Activity Barriers & Cardiac Risk Stratification   Activity Barriers Shortness of Breath;Deconditioning;Back Problems;Muscular Weakness;Balance Concerns;Assistive Device;Other (comment);Joint Problems    Comments neuropathy in feet, back and hip pain             6 Minute Walk:  6 Minute Walk     Row Name 06/19/22 1443         6  Minute Walk   Phase Initial  Distance 355 feet     Walk Time 3.03 minutes     # of Rest Breaks 2  25 sec, 17 sec, stopped at 3:44     MPH 1.33     METS 0.96     RPE 15     Perceived Dyspnea  2     VO2 Peak 3.38     Symptoms Yes (comment)     Comments hip pain 6/10, back pain 6/10, SOB, fatigue     Resting HR 62 bpm     Resting BP 146/64     Resting Oxygen Saturation  93 %     Exercise Oxygen Saturation  during 6 min walk 80 %     Max Ex. HR 74 bpm     Max Ex. BP 156/64     2 Minute Post BP 126/64       Interval HR   1 Minute HR 69     2 Minute HR 73     3 Minute HR 74     4 Minute HR 74  stopped at 3:44     2 Minute Post HR 60     Interval Heart Rate? Yes       Interval Oxygen   Interval Oxygen? Yes     Baseline Oxygen Saturation % 93 %  2L pulsed     1 Minute Oxygen Saturation % 89 %     1 Minute Liters of Oxygen 2 L  pulsed     2 Minute Oxygen Saturation % 83 %     2 Minute Liters of Oxygen 2 L     3 Minute Oxygen Saturation % 81 %     3 Minute Liters of Oxygen 3 L     4 Minute Oxygen Saturation % 80 %     4 Minute Liters of Oxygen 3 L     2 Minute Post Oxygen Saturation % 92 %     2 Minute Post Liters of Oxygen 3 L             Oxygen Initial Assessment:  Oxygen Initial Assessment - 07/04/22 1129       Home Oxygen   Home Oxygen Device Home Concentrator;E-Tanks    Sleep Oxygen Prescription Continuous    Liters per minute 3    Home Exercise Oxygen Prescription Pulsed    Liters per minute 3    Home Resting Oxygen Prescription Pulsed    Liters per minute 3    Compliance with Home Oxygen Use Yes      Program Oxygen Prescription   Program Oxygen Prescription Continuous;E-Tanks    Liters per minute 3      Intervention   Short Term Goals To learn and exhibit compliance with exercise, home and travel O2 prescription;To learn and understand importance of monitoring SPO2 with pulse oximeter and demonstrate accurate use of the pulse oximeter.;To learn and  understand importance of maintaining oxygen saturations>88%;To learn and demonstrate proper pursed lip breathing techniques or other breathing techniques. ;To learn and demonstrate proper use of respiratory medications    Long  Term Goals Exhibits compliance with exercise, home  and travel O2 prescription;Verbalizes importance of monitoring SPO2 with pulse oximeter and return demonstration;Maintenance of O2 saturations>88%;Exhibits proper breathing techniques, such as pursed lip breathing or other method taught during program session;Compliance with respiratory medication;Demonstrates proper use of MDI's             Oxygen Re-Evaluation:  Oxygen Re-Evaluation  [place] Name 07/04/22 1129 08/01/22 1124 09/07/22 1128 10/03/22 1126       Program Oxygen Prescription   Program Oxygen Prescription -- Continuous;E-Tanks Continuous;E-Tanks Continuous;E-Tanks    Liters per minute -- _0 Home Oxygen   Home Oxygen Device -- Home Concentrator;E-Tanks Home Concentrator;E-Tanks Home Concentrator;E-Tanks    Sleep Oxygen Prescription -- Continuous Continuous Continuous    Liters per minute -- _1 Home Exercise Oxygen Prescription -- Continuous Continuous Continuous    Liters per minute -- _2 Home Resting Oxygen Prescription -- Continuous Continuous Continuous    Liters per minute -- _3 Compliance with Home Oxygen Use -- Yes Yes Yes      Goals/Expected Outcomes   Short Term Goals -- To learn and exhibit compliance with exercise, home and travel O2 prescription;To learn and understand importance of monitoring SPO2 with pulse oximeter and demonstrate accurate use of the pulse oximeter.;To learn and understand importance of maintaining oxygen saturations>88%;To learn and demonstrate proper pursed lip breathing techniques or other breathing techniques. ;To learn and demonstrate proper use of respiratory medications To learn and exhibit compliance with exercise, home and travel O2  prescription;To learn and understand importance of monitoring SPO2 with pulse oximeter and demonstrate accurate use of the pulse oximeter.;To learn and understand importance of maintaining oxygen saturations>88%;To learn and demonstrate proper pursed lip breathing techniques or other breathing techniques. ;To learn and demonstrate proper use of respiratory medications To learn and exhibit compliance with exercise, home and travel O2 prescription;To learn and understand importance of monitoring SPO2 with pulse oximeter and demonstrate accurate use of the pulse oximeter.;To learn and understand importance of maintaining oxygen saturations>88%;To learn and demonstrate proper pursed lip breathing techniques or other breathing techniques. ;To learn and demonstrate proper use of respiratory medications    Long  Term Goals -- Exhibits compliance with exercise, home  and travel O2 prescription;Verbalizes importance of monitoring SPO2 with pulse oximeter and return demonstration;Maintenance of O2 saturations>88%;Exhibits proper breathing techniques, such as pursed lip breathing or other method taught during program session;Compliance with respiratory medication;Demonstrates proper use of MDI's Exhibits compliance with exercise, home  and travel O2 prescription;Verbalizes importance of monitoring SPO2 with pulse oximeter and return demonstration;Maintenance of O2 saturations>88%;Exhibits proper breathing techniques, such as pursed lip breathing or other method taught during program session;Compliance with respiratory medication;Demonstrates proper use of MDI's Exhibits compliance with exercise, home  and travel O2 prescription;Verbalizes importance of monitoring SPO2 with pulse oximeter and return demonstration;Maintenance of O2 saturations>88%;Exhibits proper breathing techniques, such as pursed lip breathing or other method taught during program session;Compliance with respiratory medication;Demonstrates proper use of  MDI's    Comments Darius Norris continues to use his oxygen as directed. He is recovering from double pneumonia and feels much better than before. He continues to watch his O2 and knows to notify his doctor of any abnormal changes. Retierated PLB. He is staying compliant with his medications and denies having any questions on how to use or take his respiratory medications. Darius Norris is doing well with his oxygen therapy.  He had a visit from oxygen company today as they checked up on his equipment.  They said all was good.  He continues to use his PLB and nose strips to help with air flow.  His saturations have continued to do well when he checks at home. Darius Norris is doing well with his oxygen therapy. His breathing is not as  good right now and chronic sinus issues have been getting worse recently; he has an appointment Monday and would like to ask them for a chest x-ray as he is afraid he has pneumonia again; he had it before in June. He continues to use PLB and nose strips to help with shortness of breath and he feels it is helpful. He rpeorts his oxygen has been doing well when he checks it at home. Darius Norris is doing well with his oxygen therapy. He reports that he has had a lot of drainage which has been causing him a little trouble with his breathing. He is still on continuous 3L of oxygen for exercise and is doing well. He continues to use PLB and nose strips to help with shortness of breath and he feels it is helpful. He rpeorts his oxygen has been doing well when he checks it at home.    Goals/Expected Outcomes Short: Continue to monitor O2 closely Long: Continue long-term compliance with PLB and symptom monitoring Short; Continue to use PLB to help with breathing Long: conitnued compliance Short: speak with doctor about increased shortness of breath Long: conitnued compliance Short: continue to check oxygen saturation at home. Long: conitnued compliance             Oxygen Discharge (Final Oxygen Re-Evaluation):   Oxygen Re-Evaluation - 10/03/22 1126       Program Oxygen Prescription   Program Oxygen Prescription Continuous;E-Tanks    Liters per minute 3      Home Oxygen   Home Oxygen Device Home Concentrator;E-Tanks    Sleep Oxygen Prescription Continuous    Liters per minute 3    Home Exercise Oxygen Prescription Continuous    Liters per minute 3    Home Resting Oxygen Prescription Continuous    Liters per minute 3    Compliance with Home Oxygen Use Yes      Goals/Expected Outcomes   Short Term Goals To learn and exhibit compliance with exercise, home and travel O2 prescription;To learn and understand importance of monitoring SPO2 with pulse oximeter and demonstrate accurate use of the pulse oximeter.;To learn and understand importance of maintaining oxygen saturations>88%;To learn and demonstrate proper pursed lip breathing techniques or other breathing techniques. ;To learn and demonstrate proper use of respiratory medications    Long  Term Goals Exhibits compliance with exercise, home  and travel O2 prescription;Verbalizes importance of monitoring SPO2 with pulse oximeter and return demonstration;Maintenance of O2 saturations>88%;Exhibits proper breathing techniques, such as pursed lip breathing or other method taught during program session;Compliance with respiratory medication;Demonstrates proper use of MDI's    Comments Darius Norris is doing well with his oxygen therapy. He reports that he has had a lot of drainage which has been causing him a little trouble with his breathing. He is still on continuous 3L of oxygen for exercise and is doing well. He continues to use PLB and nose strips to help with shortness of breath and he feels it is helpful. He rpeorts his oxygen has been doing well when he checks it at home.    Goals/Expected Outcomes Short: continue to check oxygen saturation at home. Long: conitnued compliance             Initial Exercise Prescription:  Initial Exercise Prescription -  06/19/22 1400       Date of Initial Exercise RX and Referring Provider   Date 06/19/22    Referring Provider Delorise Jackson MD   Dr. Jenne Pane ([facility])     Oxygen  Oxygen Continuous    Liters 3    Maintain Oxygen Saturation 88% or higher      Recumbant Bike   Level 1    RPM 50    Watts 10    Minutes 15    METs 1.5      NuStep   Level 1    SPM 80    Minutes 15    METs 1.5      REL-XR   Level 1    Speed 50    Minutes 15    METs 1.5      T5 Nustep   Level 1    SPM 80    Minutes 15    METs 1.5      Biostep-RELP   Level 1    SPM 50    Minutes 15    METs 2      Track   Laps 5    Minutes 15    METs 1.27      Prescription Details   Frequency (times per week) 2    Duration Progress to 30 minutes of continuous aerobic without signs/symptoms of physical distress      Intensity   THRR 40-80% of Max Heartrate 97-132    Ratings of Perceived Exertion 11-13    Perceived Dyspnea 0-4      Progression   Progression Continue to progress workloads to maintain intensity without signs/symptoms of physical distress.      Resistance Training   Training Prescription Yes    Weight 3 kb    Reps 10-15             Perform Capillary Blood Glucose checks as needed.  Exercise Prescription Changes:   Exercise Prescription Changes     Row Name 06/19/22 1400 06/28/22 1200 07/12/22 1300 07/18/22 1400 07/27/22 1500     Response to Exercise   Blood Pressure (Admit) 146/64 116/64 128/64 -- 114/64   Blood Pressure (Exercise) 156/64 138/60 134/82 -- 132/82   Blood Pressure (Exit) 128/60 128/52 116/64 -- 122/64   Heart Rate (Admit) 62 bpm 62 bpm 67 bpm -- 56 bpm   Heart Rate (Exercise) 74 bpm 64 bpm 68 bpm -- 81 bpm   Heart Rate (Exit) 60 bpm 61 bpm 62 bpm -- 63 bpm   Oxygen Saturation (Admit) 93 % 95 % 90 % -- 94 %   Oxygen Saturation (Exercise) 80 % 96 % 96 % -- 88 %   Oxygen Saturation (Exit) 96 % 94 % 99 % -- 96 %   Rating of Perceived Exertion (Exercise)  _0 -- 13   Perceived Dyspnea (Exercise) 2 -- 2 -- 1   Symptoms hip and back pain 6/10, SOB SOB SOB -- SOB   Comments walk test results second full day of exercise -- -- --   Duration -- Progress to 30 minutes of  aerobic without signs/symptoms of physical distress Continue with 30 min of aerobic exercise without signs/symptoms of physical distress. -- Continue with 30 min of aerobic exercise without signs/symptoms of physical distress.   Intensity -- THRR unchanged THRR unchanged -- THRR unchanged     Progression   Progression -- Continue to progress workloads to maintain intensity without signs/symptoms of physical distress. Continue to progress workloads to maintain intensity without signs/symptoms of physical distress. -- Continue to progress workloads to maintain intensity without signs/symptoms of physical distress.   Average METs -- 1.95 2 -- 2.06     Resistance Training  Training Prescription -- Yes Yes -- Yes   Weight -- 4 lb 4 lb -- 4 lb   Reps -- 10-15 10-15 -- 10-15     Interval Training   Interval Training -- No No -- No     Oxygen   Oxygen -- Continuous Continuous -- Continuous   Liters -- 3 3 -- 3     Treadmill   MPH -- 1 1.2 -- 1.4   Grade -- 0 0 -- 0   Minutes -- 15 15 -- 15   METs -- 1.77 1.92 -- 2.07     Recumbant Bike   Level -- -- 2 -- 2   Minutes -- -- 15 -- 15   METs -- -- -- -- 2.37     NuStep   Level -- 4 -- -- --   Minutes -- 15 -- -- --   METs -- 2.1 -- -- --     REL-XR   Level -- 1 -- -- 3   Minutes -- 15 -- -- 15   METs -- -- -- -- 2.2     Biostep-RELP   Level -- 1 -- -- --   Minutes -- 15 -- -- --   METs -- 2 -- -- --     Home Exercise Plan   Plans to continue exercise at -- -- -- Home (comment)  Treadmill for Walking; Hand Weights Home (comment)  Treadmill for Walking; Hand Weights   Frequency -- -- -- Add 3 additional days to program exercise sessions. Add 3 additional days to program exercise sessions.   Initial Home Exercises  Provided -- -- -- 07/18/22 07/18/22     Oxygen   Maintain Oxygen Saturation -- 88% or higher 88% or higher 88% or higher 88% or higher    Row Name 09/07/22 1500 09/19/22 1400 10/17/22 1400         Response to Exercise   Blood Pressure (Admit) 118/64 128/60 98/58     Blood Pressure (Exit) 124/62 118/58 112/58     Heart Rate (Admit) 60 bpm 74 bpm 60 bpm     Heart Rate (Exercise) 70 bpm 82 bpm 79 bpm     Heart Rate (Exit) 61 bpm 64 bpm 59 bpm     Oxygen Saturation (Admit) 95 % 91 % 97 %     Oxygen Saturation (Exercise) 94 % 89 % 95 %     Oxygen Saturation (Exit) 98 % 98 % 98 %     Rating of Perceived Exertion (Exercise) _0 Perceived Dyspnea (Exercise) _1 Symptoms SOB SOB SOB     Duration Continue with 30 min of aerobic exercise without signs/symptoms of physical distress. Continue with 30 min of aerobic exercise without signs/symptoms of physical distress. Continue with 30 min of aerobic exercise without signs/symptoms of physical distress.     Intensity THRR unchanged THRR unchanged THRR unchanged       Progression   Progression Continue to progress workloads to maintain intensity without signs/symptoms of physical distress. Continue to progress workloads to maintain intensity without signs/symptoms of physical distress. Continue to progress workloads to maintain intensity without signs/symptoms of physical distress.     Average METs 2.34 2.53 2.73       Resistance Training   Training Prescription Yes Yes Yes     Weight 4 lb 4 lb 4 lb     Reps 10-15 10-15 10-15  Interval Training   Interval Training No No No       Oxygen   Oxygen Continuous Continuous Continuous     Liters _0 Treadmill   MPH 1.3 1.8 1.8     Grade 0 0 0     Minutes _1 METs 1.99 2.38 2.38       Recumbant Bike   Level -- 3 3     Watts -- -- 20     Minutes -- 15 15     METs -- 2.66 --       NuStep   Level -- -- 3     Minutes -- -- 15     METs -- -- 2.2        REL-XR   Level _2 Minutes _3 METs 2.7 2.7 5.8       Home Exercise Plan   Plans to continue exercise at Home (comment)  Treadmill for Walking; Hand Weights Home (comment)  Treadmill for Walking; Hand Weights Home (comment)  Treadmill for Walking; Hand Weights     Frequency Add 3 additional days to program exercise sessions. Add 3 additional days to program exercise sessions. Add 3 additional days to program exercise sessions.     Initial Home Exercises Provided 07/18/22 07/18/22 07/18/22       Oxygen   Maintain Oxygen Saturation 88% or higher 88% or higher 88% or higher              Exercise Comments:   Exercise Comments     Row Name 06/22/22 1046           Exercise Comments First full day of exercise!  Patient was oriented to gym and equipment including functions, settings, policies, and procedures.  Patient's individual exercise prescription and treatment plan were reviewed.  All starting workloads were established based on the results of the 6 minute walk test done at initial orientation visit.  The plan for exercise progression was also introduced and progression will be customized based on patient's performance and goals.                Exercise Goals and Review:   Exercise Goals     Row Name 06/19/22 1449             Exercise Goals   Increase Physical Activity Yes       Intervention Provide advice, education, support and counseling about physical activity/exercise needs.;Develop an individualized exercise prescription for aerobic and resistive training based on initial evaluation findings, risk stratification, comorbidities and participant's personal goals.       Expected Outcomes Short Term: Attend rehab on a regular basis to increase amount of physical activity.;Long Term: Add in home exercise to make exercise part of routine and to increase amount of physical activity.;Long Term: Exercising regularly at least 3-5 days a week.       Increase  Strength and Stamina Yes       Intervention Provide advice, education, support and counseling about physical activity/exercise needs.;Develop an individualized exercise prescription for aerobic and resistive training based on initial evaluation findings, risk stratification, comorbidities and participant's personal goals.       Expected Outcomes Short Term: Increase workloads from initial exercise prescription for resistance, speed, and METs.;Short Term: Perform resistance training exercises routinely during rehab and add in resistance training at home;Long Term: Improve  cardiorespiratory fitness, muscular endurance and strength as measured by increased METs and functional capacity (6MWT)       Able to understand and use rate of perceived exertion (RPE) scale Yes       Intervention Provide education and explanation on how to use RPE scale       Expected Outcomes Short Term: Able to use RPE daily in rehab to express subjective intensity level;Long Term:  Able to use RPE to guide intensity level when exercising independently       Able to understand and use Dyspnea scale Yes       Intervention Provide education and explanation on how to use Dyspnea scale       Expected Outcomes Short Term: Able to use Dyspnea scale daily in rehab to express subjective sense of shortness of breath during exertion;Long Term: Able to use Dyspnea scale to guide intensity level when exercising independently       Knowledge and understanding of Target Heart Rate Range (THRR) Yes       Intervention Provide education and explanation of THRR including how the numbers were predicted and where they are located for reference       Expected Outcomes Short Term: Able to state/look up THRR;Short Term: Able to use daily as guideline for intensity in rehab;Long Term: Able to use THRR to govern intensity when exercising independently       Able to check pulse independently Yes       Intervention Provide education and demonstration on how  to check pulse in carotid and radial arteries.;Review the importance of being able to check your own pulse for safety during independent exercise       Expected Outcomes Short Term: Able to explain why pulse checking is important during independent exercise;Long Term: Able to check pulse independently and accurately       Understanding of Exercise Prescription Yes       Intervention Provide education, explanation, and written materials on patient's individual exercise prescription       Expected Outcomes Short Term: Able to explain program exercise prescription;Long Term: Able to explain home exercise prescription to exercise independently                Exercise Goals Re-Evaluation :  Exercise Goals Re-Evaluation     Row Name 06/22/22 1046 06/28/22 1241 07/04/22 1124 07/12/22 1322 07/27/22 1505     Exercise Goal Re-Evaluation   Exercise Goals Review -- Increase Physical Activity;Increase Strength and Stamina;Understanding of Exercise Prescription Increase Physical Activity;Increase Strength and Stamina;Understanding of Exercise Prescription Increase Physical Activity;Increase Strength and Stamina;Understanding of Exercise Prescription Increase Physical Activity;Increase Strength and Stamina;Understanding of Exercise Prescription   Comments Reviewed RPE and dyspnea scales, THR and program prescription with pt today.  Pt voiced understanding and was given a copy of goals to take home. Darius Norris has completed his first two full days of exercise again.  He was able to walk some.  We will conitnue to monitor his progress. Darius Norris just started rehab not too long ago and has not done any home exercise yet. He has a treadmill at home that he plans to use. EP will go over home exercise with patient soon once he is further into the program. 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 We did review key points on home exercise: reviewing THR, monitoring HR & O2, PLB, monitoring intensity. Darius Norris is off to a good  start in rehab.  He is up to 1.2 mph on the treadmill and  prefers treadmill to track.  He is also using 4 lb weights.  We will continue to montior his progress. Darius Norris is doing well in rehab. He recently improved his overall average MET level to 2.06 METs. He also improved his speed on the treadmill to 1.4 mph. He has done well with 4 lb hand weights for resistance training as well. We will continue to monitor his progress in the program.   Expected Outcomes Short: Use RPE daily to regulate intensity. Long: Follow program prescription in THR. Short: Continue to monitor rehab regularly Long: Continue to follow program prescription Short: EP to go over home exercise Long: Exercise independently at home at appropriate prescription Short: Continue to increase workload on treadmill, try some incline Long: conitnue to improve stamina Short: Attempt to add incline on the treadmill while maintaining speed. Long: conitnue to improve strength and stamina.    [place] Name 08/01/22 1116 09/07/22 1128 09/19/22 1409 10/02/22 1314 10/03/22 1115     Exercise Goal Re-Evaluation   Exercise Goals Review Increase Physical Activity;Increase Strength and Stamina;Understanding of Exercise Prescription Increase Physical Activity;Increase Strength and Stamina;Understanding of Exercise Prescription Increase Physical Activity;Increase Strength and Stamina;Understanding of Exercise Prescription Increase Physical Activity;Increase Strength and Stamina;Understanding of Exercise Prescription Increase Physical Activity;Increase Strength and Stamina;Understanding of Exercise Prescription   Comments Darius Norris is doing well in rehab.  He is moving around the house more, but not using his treadmill.  He knows he needs to and will try to get back to it.  He does feel like his stamina is starting to improve.  He is still limited in his activity with his tailbone and hip. Darius Norris is not exercising much at home right now, he tries to stay active around the  house, but he is not using his treadmill. He is interested in the [place] - gave him information. Darius Norris continues to do well in rehab. He recently increased his overall average MET level to 2.53 METs. He also increased his workload on the treadmill to a speed of 1.8 mph and no incline. He also improved to level 3 on the recumebnt bike. We will continue to monitor his progress. Darius Norris has not been here since last review. He had a bad reaction to a shot and has not been here since 9/28. Staff has attempted to call patient for updates and will continue to do so. Today was Mike's first day back at rehab since 9/28. He was out due to a bad reaction to a shot. He states that he was able to get on the treadmill a little at home during his time away from rehab. He is having some foot swelling that has bothered him, but has an appointment to get that cheched out. We will continue to monitor his progress as he gets back into his regular attendance at rehab.   Expected Outcomes Short: Get back on treadmill again Long: Continue to improve stamina Short: Get back on treadmill again, check out [facility] Long: Continue to improve stamina Short: Continue to increase workloads and work towards more walking. Long: Continue to increase strength and stamina. Short: Return to rehab when ready Long: Continue to build up overall strength and stmina Short: Return to regular attendance in rehab. Long: Continue to build up overall strength and stamina.    [place] Name 10/17/22 1427             Exercise Goal Re-Evaluation   Exercise Goals Review Increase Physical Activity;Increase Strength and Stamina;Understanding of Exercise Prescription  Comments Darius Norris is doing well in rehab since returning, after being out due to a bad reaction to a shot. He increased his overall average MET level to 2.73 METs. He also has been walking on the treadmill at a speed of 1.8 mph with no incline. He has done well on the XR and T4 at level 3 as well. We  will continue to monitor his progress in the program.       Expected Outcomes Short: Continue to increase workloads as tolerated. Long: Continue to build up overall strength and stamina.                Discharge Exercise Prescription (Final Exercise Prescription Changes):  Exercise Prescription Changes - 10/17/22 1400       Response to Exercise   Blood Pressure (Admit) 98/58    Blood Pressure (Exit) 112/58    Heart Rate (Admit) 60 bpm    Heart Rate (Exercise) 79 bpm    Heart Rate (Exit) 59 bpm    Oxygen Saturation (Admit) 97 %    Oxygen Saturation (Exercise) 95 %    Oxygen Saturation (Exit) 98 %    Rating of Perceived Exertion (Exercise) 13    Perceived Dyspnea (Exercise) 3    Symptoms SOB    Duration Continue with 30 min of aerobic exercise without signs/symptoms of physical distress.    Intensity THRR unchanged      Progression   Progression Continue to progress workloads to maintain intensity without signs/symptoms of physical distress.    Average METs 2.73      Resistance Training   Training Prescription Yes    Weight 4 lb    Reps 10-15      Interval Training   Interval Training No      Oxygen   Oxygen Continuous    Liters 3      Treadmill   MPH 1.8    Grade 0    Minutes 15    METs 2.38      Recumbant Bike   Level 3    Watts 20    Minutes 15      NuStep   Level 3    Minutes 15    METs 2.2      REL-XR   Level 3    Minutes 15    METs 5.8      Home Exercise Plan   Plans to continue exercise at Home (comment)   Treadmill for Walking; Hand Weights   Frequency Add 3 additional days to program exercise sessions.    Initial Home Exercises Provided 07/18/22      Oxygen   Maintain Oxygen Saturation 88% or higher             Nutrition:  Target Goals: Understanding of nutrition guidelines, daily intake of sodium <1540m, cholesterol <2027m calories 30% from fat and 7% or less from saturated fats, daily to have 5 or more servings of fruits and  vegetables.  Education: All About Nutrition: -Group instruction provided by verbal, written material, interactive activities, discussions, models, and posters to present general guidelines for heart healthy nutrition including fat, fiber, MyPlate, the role of sodium in heart healthy nutrition, utilization of the nutrition label, and utilization of this knowledge for meal planning. Follow up email sent as well. Written material given at graduation. [facility]  Education need identified 06/19/22       Biometrics:  Pre Biometrics - 06/19/22 1451  Pre Biometrics   Height 5' 5.6" (1.666 m)    Weight 184 lb 4.8 oz (83.6 kg)    BMI (Calculated) 30.12    Single Leg Stand 0 seconds              Nutrition Therapy Plan and Nutrition Goals:  Nutrition Therapy & Goals - 09/07/22 1126       Nutrition Therapy   RD appointment deferred Yes      Personal Nutrition Goals   Nutrition Goal Darius Norris would not like to speak with RD at this time; will continue to follow up.             Nutrition Assessments:  MEDIFICTS Score Key: ?70 Need to make dietary changes  40-70 Heart Healthy Diet ? 40 Therapeutic Level Cholesterol Diet  Flowsheet Row Pulmonary Rehab from [facility]19/2022 in [facility]  Picture Your Plate Total Score on Admission 52      Picture Your Plate Scores: <16 Unhealthy dietary pattern with much room for improvement. 41-50 Dietary pattern unlikely to meet recommendations for good health and room for improvement. 51-60 More healthful dietary pattern, with some room for improvement.  >60 Healthy dietary pattern, although there may be some specific behaviors that could be improved.   Nutrition Goals Re-Evaluation:  Nutrition Goals Re-Evaluation     [place] Name 07/04/22 1159 08/01/22 1120 10/03/22 1118         Goals   Nutrition Goal -- Darius Norris would not like to speak with RD at this  time; will continue to follow up. Darius Norris would not like to speak with RD at this time; will continue to follow up.     Comment Patient has yet to meet with the RD at this time. He is not sure if he would like to make an appointment, Darius Norris is doesn't want to meet with RD.  He is sticking to lean proteins.  He likes beans and potatoes.  His favorite is catelope especially during this time of the year.  He enjoys fruits like grapes too throughout the year.  He continues to work on portion control Darius Norris is doesn't want to meet with RD.  He is sticking to lean proteins.  He likes beans and potatoes.  His favorite is catelope especially during this time of the year.  He enjoys fruits like grapes too throughout the year.  He continues to work on portion control     Expected Outcome Short: Schedule appt with RD if interested Long: Follow healthy pulmonary based diet Short: Continue to work on portion control Long: continue to add in more vegetables Short: Continue to work on portion control Long: continue to add in more vegetables              Nutrition Goals Discharge (Final Nutrition Goals Re-Evaluation):  Nutrition Goals Re-Evaluation - 10/03/22 1118       Goals   Nutrition Goal Darius Norris would not like to speak with RD at this time; will continue to follow up.    Comment Darius Norris is doesn't want to meet with RD.  He is sticking to lean proteins.  He likes beans and potatoes.  His favorite is catelope especially during this time of the year.  He enjoys fruits like grapes too throughout the year.  He continues to work on portion control    Expected Outcome Short: Continue to work on portion control Long: continue to add in more vegetables  Psychosocial: Target Goals: Acknowledge presence or absence of significant depression and/or stress, maximize coping skills, provide positive support system. Participant is able to verbalize types and ability to use techniques and skills needed for reducing  stress and depression.   Education: Stress, Anxiety, and Depression - Group verbal and visual presentation to define topics covered.  Reviews how body is impacted by stress, anxiety, and depression.  Also discusses healthy ways to reduce stress and to treat/manage anxiety and depression.  Written material given at graduation. Flowsheet Row Pulmonary Rehab from 11/03/2021 in [facility]  Date 07/28/21  Educator [facility]  Instruction Review Code 1- [employer] Understanding       Education: Sleep Hygiene -Provides group verbal and written instruction about how sleep can affect your health.  Define sleep hygiene, discuss sleep cycles and impact of sleep habits. Review good sleep hygiene tips.    Initial Review & Psychosocial Screening:  Initial Psych Review & Screening - 05/08/22 1044       Initial Review   Current issues with Current Sleep Concerns;Current Psychotropic Meds;Current Depression;Current Anxiety/Panic      Family Dynamics   Good Support System? Yes    Comments Darius Norris states that his health makes his life stressful. He has been having trouble sleeping. He takes medication for his depression and anxiety.He can look to his wife and his two sisters for support.      Barriers   Psychosocial barriers to participate in program There are no identifiable barriers or psychosocial needs.;The patient should benefit from training in stress management and relaxation.      Screening Interventions   Interventions Encouraged to exercise;To provide support and resources with identified psychosocial needs;Provide feedback about the scores to participant    Expected Outcomes Short Term goal: Utilizing psychosocial counselor, staff and physician to assist with identification of specific Stressors or current issues interfering with healing process. Setting desired goal for each stressor or current issue identified.;Long Term Goal: Stressors or current issues are controlled or  eliminated.;Short Term goal: Identification and review with participant of any Quality of Life or Depression concerns found by scoring the questionnaire.;Long Term goal: The participant improves quality of Life and PHQ9 Scores as seen by post scores and/or verbalization of changes             Quality of Life Scores:  Scores of 19 and below usually indicate a poorer quality of life in these areas.  A difference of  2-3 points is a clinically meaningful difference.  A difference of 2-3 points in the total score of the Quality of Life Index has been associated with significant improvement in overall quality of life, self-image, physical symptoms, and general health in studies assessing change in quality of life.  PHQ-9: Review Flowsheet  More data exists      07/18/2022 06/19/2022 11/17/2021 06/13/2021 02/26/2019  Depression screen PHQ 2/9  Decreased Interest _0 0 1  Down, Depressed, Hopeless 0 1 0 0 0  PHQ - 2 Score _1 0 1  Altered sleeping _2 0 2  Tired, decreased energy _3 Change in appetite 0 0 1 0 1  Feeling bad or failure about yourself  0 0 0 0 0  Trouble concentrating 0 0 0 0 0  Moving slowly or fidgety/restless 0 0 0 0 0  Suicidal thoughts 0 0 0 0 0  PHQ-9 Score _4 Difficult doing work/chores Somewhat  difficult Not difficult at all Somewhat difficult - Somewhat difficult   Interpretation of Total Score  Total Score Depression Severity:  1-4 = Minimal depression, 5-9 = Mild depression, 10-14 = Moderate depression, 15-19 = Moderately severe depression, 20-27 = Severe depression   Psychosocial Evaluation and Intervention:  Psychosocial Evaluation - 05/08/22 1046       Psychosocial Evaluation & Interventions   Interventions Encouraged to exercise with the program and follow exercise prescription;Stress management education;Relaxation education    Comments Darius Norris states that his health makes his life stressful. He has been having trouble sleeping. He takes  medication for his depression and anxiety.He can look to his wife and his two sisters for support.    Expected Outcomes Short: Exercise regularly to support mental health and notify staff of any changes. Long: maintain mental health and well being through teaching of rehab or prescribed medications independently.    Continue Psychosocial Services  Follow up required by staff             Psychosocial Re-Evaluation:  Psychosocial Re-Evaluation     [place] Name 07/04/22 1131 08/01/22 1117 09/07/22 1137 10/03/22 1119       Psychosocial Re-Evaluation   Current issues with Current Sleep Concerns;Current Stress Concerns Current Sleep Concerns;Current Stress Concerns Current Sleep Concerns;Current Stress Concerns Current Sleep Concerns;Current Stress Concerns    Comments Darius Norris is just getting starting in rehab. He states his sleep is not great as he has trouble falling asleep, however, it has always been like this for a while. He takes melatonin to help but not too interested in taking something else. We talked about trying guided meditations and relaxation before bed. Discussed calm. com that has variation of meditation and deep breathing. He has some stress, but feels he tolerates it well. He is staying compliant with his medications and feels they are helping. Darius Norris is doing well in rehab. His hip and tailbone are still limiting his activity.  He bought a new mattress 3 months ago and he is ready to get rid of it as he is hurting more and his sheets don't fit.  He is upset that he spent so much money and it not working for him.  He has slats to support it as well as a box spring.  He is going to try to get rid of those.  His breathing is also limiting him in how much he is able to do.  He tries to use breathing to help focus and release stress. Darius Norris reports he is still having issues sleeping and his wife has been snoring which has been bothering him; he reports not usually being able to sleep well. Darius Norris  reports no major stress concerns, but his cat passed away last week - he continues to maintain a positive attitude. He talks to his wife, but doesn't feel like he has a good support system to rely on. He used to enjoy fishing, but he had to give that up with his continued shortness of breath. Darius Norris enjoys coming to rehab and feels it gives him some social interaction. Darius Norris reports some stress in his life with everything going on but continues to maintain a positive outlook. He states that he is still having trouble sleeping, due to him having to get up every few hours to use the restroom. He also reports having a good support system at home made up primarily by his wife.    Expected Outcomes Short: Work on different relaxation method before bed Long: Maintain  posititve attitude Short: Try working on mattress Long: continue to focus on positives Short: continue to attend rehab for mental health boost Long: continue to focus on positives Short: continue to attend rehab for mental health boost. Long: continue to maintain positive outlook.    Interventions Encouraged to attend Pulmonary Rehabilitation for the exercise -- Encouraged to attend Pulmonary Rehabilitation for the exercise Encouraged to attend Pulmonary Rehabilitation for the exercise    Continue Psychosocial Services  Follow up required by staff -- Follow up required by staff Follow up required by staff             Psychosocial Discharge (Final Psychosocial Re-Evaluation):  Psychosocial Re-Evaluation - 10/03/22 1119       Psychosocial Re-Evaluation   Current issues with Current Sleep Concerns;Current Stress Concerns    Comments Darius Norris reports some stress in his life with everything going on but continues to maintain a positive outlook. He states that he is still having trouble sleeping, due to him having to get up every few hours to use the restroom. He also reports having a good support system at home made up primarily by his wife.    Expected  Outcomes Short: continue to attend rehab for mental health boost. Long: continue to maintain positive outlook.    Interventions Encouraged to attend Pulmonary Rehabilitation for the exercise    Continue Psychosocial Services  Follow up required by staff             Education: Education Goals: Education classes will be provided on a weekly basis, covering required topics. Participant will state understanding/return demonstration of topics presented.  Learning Barriers/Preferences:  Learning Barriers/Preferences - 05/08/22 1042       Learning Barriers/Preferences   Learning Barriers None    Learning Preferences Group Instruction;Individual Instruction;Pictoral;Skilled Demonstration;Verbal Instruction;Video;Written Material             General Pulmonary Education Topics:  Infection Prevention: - Provides verbal and written material to individual with discussion of infection control including proper hand washing and proper equipment cleaning during exercise session. Flowsheet Row Pulmonary Rehab from 07/27/2022 in [facility] Cardiac and Pulmonary Rehab  Date 05/08/22  Educator [facility]  Instruction Review Code 1- Verbalizes Understanding       Falls Prevention: - Provides verbal and written material to individual with discussion of falls prevention and safety. Flowsheet Row Pulmonary Rehab from 07/27/2022 in [facility] Cardiac and Pulmonary Rehab  Date 05/08/22  Educator [facility]  Instruction Review Code 1- Verbalizes Understanding       Chronic Lung Disease Review: - Group verbal instruction with posters, models, PowerPoint presentations and videos,  to review new updates, new respiratory medications, new advancements in procedures and treatments. Providing information on websites and "800" numbers for continued self-education. Includes information about supplement oxygen, available portable oxygen systems, continuous and intermittent flow rates, oxygen safety, concentrators, and Medicare  reimbursement for oxygen. Explanation of Pulmonary Drugs, including class, frequency, complications, importance of spacers, rinsing mouth after steroid MDI's, and proper cleaning methods for nebulizers. Review of basic lung anatomy and physiology related to function, structure, and complications of lung disease. Review of risk factors. Discussion about methods for diagnosing sleep apnea and types of masks and machines for OSA. Includes a review of the use of types of environmental controls: home humidity, furnaces, filters, dust mite/pet prevention, HEPA vacuums. Discussion about weather changes, air quality and the benefits of nasal washing. Instruction on Warning signs, infection symptoms, calling MD promptly, preventive modes, and value of vaccinations. Review of  effective airway clearance, coughing and/or vibration techniques. Emphasizing that all should Create an Action Plan. Written material given at graduation. Flowsheet Row Pulmonary Rehab from 11/03/2021 in [facility] Cardiac and Pulmonary Rehab  Date 09/22/21  Educator [facility]  Instruction Review Code 1- Verbalizes Understanding       AED/CPR: - Group verbal and written instruction with the use of models to demonstrate the basic use of the AED with the basic ABC's of resuscitation. Flowsheet Row Pulmonary Rehab from 02/26/2019 in [facility] Cardiac and Pulmonary Rehab  Date 01/01/19  Educator [facility]  Instruction Review Code 1- Verbalizes Understanding        Anatomy and Cardiac Procedures: - Group verbal and visual presentation and models provide information about basic cardiac anatomy and function. Reviews the testing methods done to diagnose heart disease and the outcomes of the test results. Describes the treatment choices: Medical Management, Angioplasty, or Coronary Bypass Surgery for treating various heart conditions including Myocardial Infarction, Angina, Valve Disease, and Cardiac Arrhythmias.  Written material given at graduation.   Medication  Safety: - Group verbal and visual instruction to review commonly prescribed medications for heart and lung disease. Reviews the medication, class of the drug, and side effects. Includes the steps to properly store meds and maintain the prescription regimen.  Written material given at graduation. Flowsheet Row Pulmonary Rehab from 11/03/2021 in [facility] Cardiac and Pulmonary Rehab  Date 09/01/21  Educator [facility]  Instruction Review Code 1- Verbalizes Understanding       Other: -Provides group and verbal instruction on various topics (see comments)   Knowledge Questionnaire Score:  Knowledge Questionnaire Score - 06/19/22 1452       Knowledge Questionnaire Score   Pre Score 17/18              Core Components/Risk Factors/Patient Goals at Admission:  Personal Goals and Risk Factors at Admission - 06/19/22 1452       Core Components/Risk Factors/Patient Goals on Admission    Weight Management Yes;Weight Loss;Obesity    Intervention Weight Management: Develop a combined nutrition and exercise program designed to reach desired caloric intake, while maintaining appropriate intake of nutrient and fiber, sodium and fats, and appropriate energy expenditure required for the weight goal.;Weight Management: Provide education and appropriate resources to help participant work on and attain dietary goals.;Weight Management/Obesity: Establish reasonable short term and long term weight goals.;Obesity: Provide education and appropriate resources to help participant work on and attain dietary goals.    Admit Weight 184 lb 4.8 oz (83.6 kg)    Goal Weight: Short Term 180 lb (81.6 kg)    Goal Weight: Long Term 180 lb (81.6 kg)    Expected Outcomes Short Term: Continue to assess and modify interventions until short term weight is achieved;Long Term: Adherence to nutrition and physical activity/exercise program aimed toward attainment of established weight goal;Weight Loss: Understanding of general  recommendations for a balanced deficit meal plan, which promotes 1-2 lb weight loss per week and includes a negative energy balance of [id number] kcal/d;Understanding of distribution of calorie intake throughout the day with the consumption of 4-5 meals/snacks;Understanding recommendations for meals to include 15-35% energy as protein, 25-35% energy from fat, 35-60% energy from carbohydrates, less than 255m of dietary cholesterol, 20-35 gm of total fiber daily    Intervention Provide education, individualized exercise plan and daily activity instruction to help decrease symptoms of SOB with activities of daily living.    Expected Outcomes Short Term: Improve cardiorespiratory fitness to achieve a reduction of symptoms when  performing ADLs;Long Term: Be able to perform more ADLs without symptoms or delay the onset of symptoms    Increase knowledge of respiratory medications and ability to use respiratory devices properly  Yes    Intervention Provide education and demonstration as needed of appropriate use of medications, inhalers, and oxygen therapy.    Expected Outcomes Short Term: Achieves understanding of medications use. Understands that oxygen is a medication prescribed by physician. Demonstrates appropriate use of inhaler and oxygen therapy.;Long Term: Maintain appropriate use of medications, inhalers, and oxygen therapy.    Diabetes Yes    Intervention Provide education about signs/symptoms and action to take for hypo/hyperglycemia.;Provide education about proper nutrition, including hydration, and aerobic/resistive exercise prescription along with prescribed medications to achieve blood glucose in normal ranges: Fasting glucose 65-99 mg/dL    Expected Outcomes Short Term: Participant verbalizes understanding of the signs/symptoms and immediate care of hyper/hypoglycemia, proper foot care and importance of medication, aerobic/resistive exercise and nutrition plan for blood glucose control.;Long Term:  Attainment of HbA1C < 7%.    Hypertension Yes    Intervention Provide education on lifestyle modifcations including regular physical activity/exercise, weight management, moderate sodium restriction and increased consumption of fresh fruit, vegetables, and low fat dairy, alcohol moderation, and smoking cessation.;Monitor prescription use compliance.    Expected Outcomes Short Term: Continued assessment and intervention until BP is < 140/25m HG in hypertensive participants. < 130/814mHG in hypertensive participants with diabetes, heart failure or chronic kidney disease.;Long Term: Maintenance of blood pressure at goal levels.    Lipids Yes    Intervention Provide education and support for participant on nutrition & aerobic/resistive exercise along with prescribed medications to achieve LDL <707mHDL >36m75m  Expected Outcomes Short Term: Participant states understanding of desired cholesterol values and is compliant with medications prescribed. Participant is following exercise prescription and nutrition guidelines.;Long Term: Cholesterol controlled with medications as prescribed, with individualized exercise RX and with personalized nutrition plan. Value goals: LDL < 70mg30mL > 40 mg.             Education:Diabetes - Individual verbal and written instruction to review signs/symptoms of diabetes, desired ranges of glucose level fasting, after meals and with exercise. Acknowledge that pre and post exercise glucose checks will be done for 3 sessions at entry of program. Flowsheet [facility]  Date 05/08/22  [facility] 1- Verbalizes Understanding       Know Your Numbers and Heart Failure: - Group verbal and visual instruction to discuss disease risk factors for cardiac and pulmonary disease and treatment options.  Reviews associated critical values for Overweight/Obesity, Hypertension, Cholesterol, and  Diabetes.  Discusses basics of heart failure: signs/symptoms and treatments.  Introduces Heart Failure Zone chart for action plan for heart failure.  Written material given at graduation. Flowsheet Row [facility]  Date 07/27/22  Educator [facility]  Instruction Review Code 1- Verbalizes Understanding       Core Components/Risk Factors/Patient Goals Review:   Goals and Risk Factor Review     Row Name 07/04/22 1126 08/01/22 1121 09/07/22 1132 10/03/22 1123       Core Components/Risk Factors/Patient Goals Review   Personal Goals Review Weight Management/Obesity;Diabetes;Hypertension;Improve shortness of breath with ADL's Weight Management/Obesity;Diabetes;Hypertension;Improve shortness of breath with ADL's;Increase knowledge of respiratory medications and ability to use respiratory devices properly. Diabetes;Improve shortness of breath with ADL's;Increase knowledge of respiratory medications  and ability to use respiratory devices properly.;Hypertension Diabetes;Improve shortness of breath with ADL's;Increase knowledge of respiratory medications and ability to use respiratory devices properly.;Hypertension;Weight Management/Obesity    Review Darius Norris just started the program not too long ago. His doctor recently changed his BP medications about 3 weeks ago and has been feeling slightly dizzy from it. He reports BP stays stable and now low. Told him to let them know as soon as possible just in case they need to make changes. He checks his blood sugars once/ week as what he was advised by the doctor and states it is always under 130.Marland Kitchen He has lost 30 lbs since last year which was intentional for the most part. He is looking to lose more, just reiterated to make sure he is not losing muscle mass. He stopped drinking regular soda and switched to diet which he thinks made a huge difference. He stays compliant with medications Darius Norris is doing well in rehab.  His weight  is down some and his dieruetic is working well, he just watches his timing of it to make sure he is not out when it hits.  He can tell a difference when he misses his dose as his feet will swell.  [facility].  His pressures are doing well as are his sugars.  He continues to keep eye on them and takes his meds nights. His breathing is doing a little better.  He uses his inhaler routinely but only nebulizer when he feels clogged.  He was encourged to use nebulizer more frequently to help. Darius Norris is doing well  in rehab. His breathing is not as good right now and chronic sinus issues have been getting worse recently; he has an appointment Monday and would like to ask them for a chest x-ray as he is afraid he has pneumonia again; he had it before in June. He continues to use PLB and nose strips to help with shortness of breath and he feels it is helpful. He rpeorts his oxygen has been doing well when he checks it at home. Darius Norris reports he saw his cardiologist last week and put him on a new medication for his Afib. He doesn't check his BP as often at home anymore- encouraged him to check it at least 2x/week at home. Darius Norris reports his BG has been running ok with metformin <130 fasting; he checks it weekly. He continues to take his medications as prescribed with no issues. Darius Norris rpeorts also having foot pain right now and trouble with his balance. Darius Norris is doing well in rehab. He reports that he has gained a little weight recently due to his medication and overeating. He would like to lose around 5 pounds. He has been checking his blood sugar regularly and states that it has been staying within normal ranges. Darius Norris has not been checking his BP regularly, but does occasionally check it when he feels off.  He states that he is still taking all his medications as prescribed.    Expected Outcomes Short: Monitor BP closely and talk to doctor about dizziness Long: Continue to manage  lifestyle risk factors Short: Try to use nebulizer more frequently Long: conitnue to work on weight loss Short: See MD Monday  Long: conitnue to work on weight loss Short: continue to work on weight loss. Long: conitnue to manage lifestyle risk factors.             Core Components/Risk Factors/Patient Goals at  Discharge (Final Review):   Goals and Risk Factor Review - 10/03/22 1123       Core Components/Risk Factors/Patient Goals Review   Personal Goals Review Diabetes;Improve shortness of breath with ADL's;Increase knowledge of respiratory medications and ability to use respiratory devices properly.;Hypertension;Weight Management/Obesity    Review Darius Norris is doing well in rehab. He reports that he has gained a little weight recently due to his medication and overeating. He would like to lose around 5 pounds. He has been checking his blood sugar regularly and states that it has been staying within normal ranges. Darius Norris has not been checking his BP regularly, but does occasionally check it when he feels off.  He states that he is still taking all his medications as prescribed.    Expected Outcomes Short: continue to work on weight loss. Long: conitnue to manage lifestyle risk factors.             ITP Comments:  ITP Comments     Row Name 05/08/22 1041 06/19/22 1443 06/22/22 1045 06/28/22 1024 07/26/22 0804   ITP Comments Virtual Visit completed. Patient informed on EP and RD appointment and 6 Minute walk test. Patient also informed of patient health questionnaires on My Chart. Patient Verbalizes understanding. Visit diagnosis can be found in [facility], patient is [place]. Completed 6MWT and gym orientation. Initial ITP created and sent for review to Dr. Zetta Bills, Medical Director. First full day of exercise!  Patient was oriented to gym and equipment including functions, settings, policies, and procedures.  Patient's individual exercise prescription and treatment plan were reviewed.  All  starting workloads were established based on the results of the 6 minute walk test done at initial orientation visit.  The plan for exercise progression was also introduced and progression will be customized based on patient's performance and goals. 30 Day review completed. Medical Director ITP review done, changes made as directed, and signed approval by Medical Director.   NEW 30 Day review completed. Medical Director ITP review done, changes made as directed, and signed approval by Medical Director.    [place] Name 08/23/22 1338 09/20/22 0743 10/18/22 1001       ITP Comments 30 Day review completed. Medical Director ITP review done, changes made as directed, and signed approval by Medical Director. 30 Day review completed. Medical Director ITP review done, changes made as directed, and signed approval by Medical Director. 30 Day review completed. Medical Director ITP review done, changes made as directed, and signed approval by Medical Director.              Comments:

## 2022-10-19 ENCOUNTER — Encounter: Payer: No Typology Code available for payment source | Admitting: *Deleted

## 2022-10-24 ENCOUNTER — Encounter: Payer: No Typology Code available for payment source | Attending: Anesthesiology | Admitting: *Deleted

## 2022-10-24 DIAGNOSIS — R0602 Shortness of breath: Secondary | ICD-10-CM | POA: Insufficient documentation

## 2022-10-24 DIAGNOSIS — J449 Chronic obstructive pulmonary disease, unspecified: Secondary | ICD-10-CM | POA: Diagnosis not present

## 2022-10-24 NOTE — Progress Notes (Signed)
Daily Session Note  Patient Details  Name: Darius Norris MRN: [id number] Date of Birth: 1951-11-23 Referring Provider:   Flowsheet Row Pulmonary Rehab from 06/19/2022 in [facility] Cardiac and Pulmonary Rehab  Referring Provider Delorise Jackson MD  [Dr. Jenne Pane ([facility])]       Encounter Date: 10/24/2022  Check In:  Session Check In - 10/24/22 1152       Check-In   Supervising physician immediately available to respond to emergencies See telemetry face sheet for immediately available ER MD    Location [facility]-Cardiac & Pulmonary Rehab    Staff Present Heath Lark, RN, BSN, CCRP;Jessica Lisbon, MA, RCEP, CCRP, Marylynn Pearson, MS, ASCM CEP, Exercise Physiologist;Noah Tickle, BS, Exercise Physiologist    Virtual Visit No    Medication changes reported     No    Fall or balance concerns reported    No    Warm-up and Cool-down Performed on first and last piece of equipment    Resistance Training Performed Yes    VAD Patient? No    PAD/SET Patient? No      Pain Assessment   Currently in Pain? No/denies                Social History   Tobacco Use  Smoking Status Former   Packs/day: 1.00   Years: 30.00   Total pack years: 30.00   Types: Cigarettes   Quit date: 12/18/2001   Years since quitting: 20.8  Smokeless Tobacco Never    Goals Met:  Proper associated with RPD/PD & O2 Sat Independence with exercise equipment Exercise tolerated well No report of concerns or symptoms today  Goals Unmet:  Not Applicable  Comments: Pt able to follow exercise prescription today without complaint.  Will continue to monitor for progression.    Dr. Emily Filbert is Medical Director for [place].  Dr. Ottie Glazier is Medical Director for [facility] Pulmonary Rehabilitation.

## 2022-10-26 ENCOUNTER — Encounter: Payer: No Typology Code available for payment source | Admitting: *Deleted

## 2022-10-26 DIAGNOSIS — J449 Chronic obstructive pulmonary disease, unspecified: Secondary | ICD-10-CM | POA: Diagnosis not present

## 2022-10-26 NOTE — Progress Notes (Signed)
Daily Session Note  Patient Details  Name: Darius Norris MRN: [id number] Date of Birth: 03/21/1951 Referring Provider:   Flowsheet Row Pulmonary Rehab from 06/19/2022 in [facility] Cardiac and Pulmonary Rehab  Referring Provider Luccarelli, [name] MD  [Dr. Mary Koethe ([facility])]       Encounter Date: 10/26/2022  Check In:  Session Check In - 10/26/22 1325       Check-In   Supervising physician immediately available to respond to emergencies See telemetry face sheet for immediately available ER MD    Location [facility]-Cardiac & Pulmonary Rehab    Staff Present Noah Tickle, BS, Exercise Physiologist;Meredith Craven, RN BSN;Joseph Hood, RCP,RRT,BSRT; , RN, ADN    Virtual Visit No    Medication changes reported     No    Fall or balance concerns reported    No    Warm-up and Cool-down Performed on first and last piece of equipment    Resistance Training Performed Yes    VAD Patient? No    PAD/SET Patient? No      Pain Assessment   Currently in Pain? No/denies                Social History   Tobacco Use  Smoking Status Former   Packs/day: 1.00   Years: 30.00   Total pack years: 30.00   Types: Cigarettes   Quit date: 12/18/2001   Years since quitting: 20.8  Smokeless Tobacco Never    Goals Met:  Independence with exercise equipment Exercise tolerated well No report of concerns or symptoms today Strength training completed today  Goals Unmet:  Not Applicable  Comments: Pt able to follow exercise prescription today without complaint.  Will continue to monitor for progression.    Dr. Mark Miller is Medical Director for [facility].  Dr. Fuad Aleskerov is Medical Director for [name] Pulmonary Rehabilitation. 

## 2022-10-31 ENCOUNTER — Encounter: Payer: No Typology Code available for payment source | Admitting: *Deleted

## 2022-10-31 DIAGNOSIS — J449 Chronic obstructive pulmonary disease, unspecified: Secondary | ICD-10-CM | POA: Diagnosis not present

## 2022-10-31 NOTE — Progress Notes (Signed)
Daily Session Note  Patient Details  Name: Darius Norris MRN: [id number] Date of Birth: Sep 18, 1951 Referring Provider:   Flowsheet Row Pulmonary Rehab from 06/19/2022 in [facility] Cardiac and Pulmonary Rehab  Referring Provider Delorise Jackson MD  [Dr. Jenne Pane ([facility])]       Encounter Date: 10/31/2022  Check In:  Session Check In - 10/31/22 1117       Check-In   Supervising physician immediately available to respond to emergencies See telemetry face sheet for immediately available ER MD    Location [facility]-Cardiac & Pulmonary Rehab    Staff Present Alberteen Sam, MA, RCEP, CCRP, Marylynn Pearson, MS, ASCM CEP, Exercise Physiologist;Yonathan Perrow Sherryll Burger, RN BSN;Noah Tickle, BS, Exercise Physiologist    Virtual Visit No    Medication changes reported     No    Fall or balance concerns reported    No    Warm-up and Cool-down Performed on first and last piece of equipment    Resistance Training Performed Yes    VAD Patient? No    PAD/SET Patient? No      Pain Assessment   Currently in Pain? No/denies                Social History   Tobacco Use  Smoking Status Former   Packs/day: 1.00   Years: 30.00   Total pack years: 30.00   Types: Cigarettes   Quit date: 12/18/2001   Years since quitting: 20.8  Smokeless Tobacco Never    Goals Met:  Independence with exercise equipment Exercise tolerated well No report of concerns or symptoms today Strength training completed today  Goals Unmet:  Not Applicable  Comments: Pt able to follow exercise prescription today without complaint.  Will continue to monitor for progression.    Dr. Emily Filbert is Medical Director for [place].  Dr. Ottie Glazier is Medical Director for [facility] Pulmonary Rehabilitation.

## 2022-11-02 ENCOUNTER — Encounter: Payer: No Typology Code available for payment source | Admitting: *Deleted

## 2022-11-02 DIAGNOSIS — J449 Chronic obstructive pulmonary disease, unspecified: Secondary | ICD-10-CM

## 2022-11-02 NOTE — Progress Notes (Signed)
Daily Session Note  Patient Details  Name: CLANCEY WELTON MRN: [id number] Date of Birth: January 25, 1951 Referring Provider:   Flowsheet Row Pulmonary Rehab from 06/19/2022 in [facility]  Referring Provider Delorise Jackson MD  [Dr. Jenne Pane ([facility])]       Encounter Date: 11/02/2022  Check In:  Session Check In - 11/02/22 1142       Check-In   Supervising physician immediately available to respond to emergencies See telemetry face sheet for immediately available ER MD    Location [facility]-Cardiac & Pulmonary Rehab    Staff Present Renita Papa, RN BSN;Jessica [place], MA, RCEP, CCRP, CCET;Joseph [place], [place], RN, [place]    Virtual Visit No    Medication changes reported     No    Fall or balance concerns reported    No    Warm-up and Cool-down Performed on first and last piece of equipment    Resistance Training Performed Yes    VAD Patient? No    PAD/SET Patient? No      Pain Assessment   Currently in Pain? No/denies                Social History   Tobacco Use  Smoking Status Former   Packs/day: 1.00   Years: 30.00   Total pack years: 30.00   Types: Cigarettes   Quit date: 12/18/2001   Years since quitting: 20.8  Smokeless Tobacco Never    Goals Met:  Independence with exercise equipment Exercise tolerated well No report of concerns or symptoms today Strength training completed today  Goals Unmet:  Not Applicable  Comments: Pt able to follow exercise prescription today without complaint.  Will continue to monitor for progression.    Dr. Emily Filbert is Medical Director for [place].  Dr. Ottie Glazier is Medical Director for [facility] Pulmonary Rehabilitation.

## 2022-11-14 ENCOUNTER — Encounter: Payer: No Typology Code available for payment source | Admitting: *Deleted

## 2022-11-14 DIAGNOSIS — J449 Chronic obstructive pulmonary disease, unspecified: Secondary | ICD-10-CM

## 2022-11-14 DIAGNOSIS — R0602 Shortness of breath: Secondary | ICD-10-CM

## 2022-11-14 NOTE — Progress Notes (Signed)
Daily Session Note  Patient Details  Name: Darius Norris MRN: [id number] Date of Birth: 09/14/1951 Referring Provider:   Flowsheet Row Pulmonary Rehab from 06/19/2022 in [facility] Card[facility] and [facility] Re[facility]  Referring Provider Luccarelli, [name] MD  [Dr. Mary Koethe ([facility])]       Encounter Date: 11/14/2022  Check In:  Session Check In - 11/14/22 1113       Check-In   Supervising physician immediately available to respond to emergencies See telemetry face sheet for immediately available ER MD    Location [facility]    Staff Present Kara Langdon, MS, ASCM CEP, Exercise Physiologist;Jessica Hawkins, MA, RCEP, CCRP, CCET; , RN BSN    Virtual Visit No    Medication changes reported     No    Fall or balance concerns reported    No    Warm-up and Cool-down Performed on first and last piece of equipment    Resistance Training Performed Yes    VAD Patient? No    PAD/SET Patient? No      Pain Assessment   Currently in Pain? No/denies                Social History   Tobacco Use  Smoking Status Former   Packs/day: 1.00   Years: 30.00   Total pack years: 30.00   Types: Cigarettes   Quit date: 12/18/2001   Years since quitting: 20.9  Smokeless Tobacco Never    Goals Met:  Independence with exercise equipment Exercise tolerated well No report of concerns or symptoms today Strength training completed today  Goals Unmet:  Not Applicable  Comments: Pt able to follow exercise prescription today without complaint.  Will continue to monitor for progression.    Dr. Mark Miller is Medical Director for [facility].  Dr. Fuad Aleskerov is Medical Director for [name]. 

## 2022-11-15 ENCOUNTER — Encounter: Payer: Self-pay | Admitting: *Deleted

## 2022-11-15 DIAGNOSIS — J449 Chronic obstructive pulmonary disease, unspecified: Secondary | ICD-10-CM

## 2022-11-15 NOTE — Progress Notes (Signed)
Pulmonary Individual Treatment Plan  Patient Details  Name: Darius Norris MRN: [id number] Date of Birth: Jul 10, 1951 Referring Provider:   Flowsheet Row Pulmonary Rehab from 06/19/2022 in [facility] Cardiac and Pulmonary Rehab  Referring Provider Delorise Jackson MD  [Dr. Jenne Pane ([facility])]       Initial Encounter Date:  Flowsheet Row Pulmonary Rehab from 06/19/2022 in [facility] Cardiac and Pulmonary Rehab  Date 06/19/22       Visit Diagnosis: Chronic obstructive pulmonary disease, unspecified COPD type ([place])  Patient's Home Medications on Admission:  Current Outpatient Medications:    acetaminophen (TYLENOL) 325 MG tablet, Take 650 mg by mouth every 6 (six) hours as needed., Disp: , Rfl:    albuterol (VENTOLIN HFA) 108 (90 Base) MCG/ACT inhaler, Inhale into the lungs., Disp: , Rfl:    aspirin EC 81 MG EC tablet, Take 1 tablet (81 mg total) by mouth daily., Disp: 180 tablet, Rfl: 0   atorvastatin (LIPITOR) 80 MG tablet, Take 1 tablet (80 mg total) by mouth at bedtime., Disp: 30 tablet, Rfl: 0   budesonide-formoterol (SYMBICORT) 160-4.5 MCG/ACT inhaler, Inhale 2 puffs into the lungs 2 (two) times daily., Disp: , Rfl:    busPIRone (BUSPAR) 10 MG tablet, Take by mouth., Disp: , Rfl:    clobetasol (TEMOVATE) 0.05 % external solution, Apply topically., Disp: , Rfl:    dabigatran (PRADAXA) 150 MG CAPS capsule, Take 150 mg by mouth 2 (two) times daily., Disp: , Rfl:    diltiazem (CARDIZEM CD) 300 MG 24 hr capsule, Take 1 capsule (300 mg total) by mouth daily., Disp: 30 capsule, Rfl: 2   doxycycline (VIBRAMYCIN) 100 MG capsule, Take 1 capsule (100 mg total) by mouth 2 (two) times daily., Disp: 14 capsule, Rfl: 0   furosemide (LASIX) 40 MG tablet, Take 1 tablet (40 mg total) by mouth daily., Disp: 30 tablet, Rfl: 2   gabapentin (NEURONTIN) 300 MG capsule, Take 1 capsule by mouth 3 (three) times daily as needed., Disp: , Rfl:    guaifenesin (HUMIBID E) 400 MG TABS tablet, Take by mouth., Disp: ,  Rfl:    hydrocortisone 2.5 % cream, Apply topically., Disp: , Rfl:    hydrOXYzine (ATARAX/VISTARIL) 25 MG tablet, Take by mouth., Disp: , Rfl:    Ipratropium-Albuterol (COMBIVENT) 20-100 MCG/ACT AERS respimat, Inhale 1 puff into the lungs every 6 (six) hours as needed for wheezing or shortness of breath., Disp: , Rfl:    isosorbide mononitrate (IMDUR) 60 MG 24 hr tablet, Take 60 mg by mouth daily., Disp: , Rfl:    ketorolac (ACULAR) 0.5 % ophthalmic solution, INSTILL 1 DROP OPERATIVE EYE FOUR TIMES A DAY FOR POSTOPERATIVE PAIN/INFLAMMATION SEND TO 4B AMB SURGERY OR ON DAY OF SURGERY 04/05/22, Disp: , Rfl:    lidocaine (XYLOCAINE) 5 % ointment, Apply topically., Disp: , Rfl:    loratadine (CLARITIN) 10 MG tablet, Take 10 mg by mouth daily as needed for allergies. , Disp: , Rfl:    Melatonin 3 MG CAPS, Take 2 capsules by mouth at bedtime., Disp: , Rfl:    metFORMIN (GLUCOPHAGE-XR) 500 MG 24 hr tablet, Take 500 mg by mouth in the morning and at bedtime., Disp: , Rfl:    methotrexate (RHEUMATREX) 2.5 MG tablet, Take 10 mg by mouth once a week., Disp: , Rfl:    metoprolol tartrate (LOPRESSOR) 100 MG tablet, Take 1 tablet (100 mg total) by mouth 2 (two) times daily., Disp: 60 tablet, Rfl: 2   mometasone (ASMANEX) 220 MCG/INH inhaler, Take by mouth.,  Disp: , Rfl:    moxifloxacin (VIGAMOX) 0.5 % ophthalmic solution, INSTILL 1 DROP IN LEFT EYE FOUR TIMES A DAY FOR CATARACT SURGERY SEND TO 4B AMB SURGERY OR ON DAY OF SURGERY 04/05/22, Disp: , Rfl:    predniSONE (STERAPRED UNI-PAK 21 TAB) 10 MG (21) TBPK tablet, Per packaging instructions, Disp: 21 each, Rfl: 0   senna (SENOKOT) 8.6 MG TABS tablet, Take 1 tablet by mouth daily as needed for mild constipation., Disp: , Rfl:    simethicone (MYLICON) 80 MG chewable tablet, CHEW ONE TABLET BY MOUTH THREE TIMES A DAY AFTER MEALS AS NEEDED FOR GAS, Disp: , Rfl:   Past Medical History: Past Medical History:  Diagnosis Date   A-fib ([place])    CAD (coronary artery  disease)    Colitis    COPD (chronic obstructive pulmonary disease) (HCC)    Emphysema lung (HCC)    GERD (gastroesophageal reflux disease)    Hypertension    MI (myocardial infarction) ([place]) 1999    Tobacco Use: Social History   Tobacco Use  Smoking Status Former   Packs/day: 1.00   Years: 30.00   Total pack years: 30.00   Types: Cigarettes   Quit date: 12/18/2001   Years since quitting: 20.9  Smokeless Tobacco Never    Labs: Review Flowsheet  More data may exist      Latest Ref Rng & Units 02/04/2013 09/04/2016 05/02/2018 01/05/2019 12/06/2021  Labs for ITP Cardiac and Pulmonary Rehab  Cholestrol 0 - 200 mg/dL 185  116  - - 106   LDL (calc) 0 - 99 mg/dL 114  51  - - 51   HDL-C >40 mg/dL 38  42  - - 33   Trlycerides <150 mg/dL 166  116  - - 108   Hemoglobin A1c 4.8 - 5.6 % - 6.4  - - 5.9   Bicarbonate 20.0 - 28.0 mmol/L - - 36.3  29.6  -  O2 Saturation % - - - 81.7  -     Pulmonary Assessment Scores:  Pulmonary Assessment Scores     Row Name 06/19/22 1455         ADL UCSD   ADL Phase Entry     SOB Score total 49     Rest 0     Walk 2     Stairs 4     Bath 3     Dress 1     Shop 2       CAT Score   CAT Score 17       mMRC Score   mMRC Score 2              UCSD: Self-administered rating of dyspnea associated with activities of daily living (ADLs) 6-point scale (0 = "not at all" to 5 = "maximal or unable to do because of breathlessness")  Scoring Scores range from 0 to 120.  Minimally important difference is 5 units  CAT: CAT can identify the health impairment of COPD patients and is better correlated with disease progression.  CAT has a scoring range of zero to 40. The CAT score is classified into four groups of low (less than 10), medium (10 - 20), high (21-30) and very high (31-40) based on the impact level of disease on health status. A CAT score over 10 suggests significant symptoms.  A worsening CAT score could be explained by an exacerbation,  poor medication adherence, poor inhaler technique, or progression of COPD or comorbid conditions.  CAT  MCID is 2 points  mMRC: mMRC (Modified Medical Research Council) Dyspnea Scale is used to assess the degree of baseline functional disability in patients of respiratory disease due to dyspnea. No minimal important difference is established. A decrease in score of 1 point or greater is considered a positive change.   Pulmonary Function Assessment:  Pulmonary Function Assessment - 06/19/22 1455       Breath   Shortness of Breath Yes;Fear of Shortness of Breath;Limiting activity             Exercise Target Goals: Exercise Program Goal: Individual exercise prescription set using results from initial 6 min walk test and THRR while considering  patient's activity barriers and safety.   Exercise Prescription Goal: Initial exercise prescription builds to 30-45 minutes a day of aerobic activity, 2-3 days per week.  Home exercise guidelines will be given to patient during program as part of exercise prescription that the participant will acknowledge.  Education: Aerobic Exercise: - Group verbal and visual presentation on the components of exercise prescription. Introduces F.I.T.T principle from ACSM for exercise prescriptions.  Reviews F.I.T.T. principles of aerobic exercise including progression. Written material given at graduation. Flowsheet Row Pulmonary Rehab from 11/03/2021 in [facility] Cardiac and Pulmonary Rehab  Date 10/13/21  Educator [facility]  Instruction Review Code 1- Verbalizes Understanding       Education: Resistance Exercise: - Group verbal and visual presentation on the components of exercise prescription. Introduces F.I.T.T principle from ACSM for exercise prescriptions  Reviews F.I.T.T. principles of resistance exercise including progression. Written material given at graduation.    Education: Exercise & Equipment Safety: - Individual verbal instruction and demonstration of  equipment use and safety with use of the equipment. Flowsheet Row Pulmonary Rehab from 07/27/2022 in [facility] Cardiac and Pulmonary Rehab  Date 05/08/22  Educator [facility]  Instruction Review Code 1- Verbalizes Understanding       Education: Exercise Physiology & General Exercise Guidelines: - Group verbal and written instruction with models to review the exercise physiology of the cardiovascular system and associated critical values. Provides general exercise guidelines with specific guidelines to those with heart or lung disease.  Flowsheet Row Pulmonary Rehab from 11/03/2021 in [facility] Cardiac and Pulmonary Rehab  Date 08/04/21  Educator [facility]  Instruction Review Code 1- Verbalizes Understanding       Education: Flexibility, Balance, Mind/Body Relaxation: - Group verbal and visual presentation with interactive activity on the components of exercise prescription. Introduces F.I.T.T principle from ACSM for exercise prescriptions. Reviews F.I.T.T. principles of flexibility and balance exercise training including progression. Also discusses the mind body connection.  Reviews various relaxation techniques to help reduce and manage stress (i.e. Deep breathing, progressive muscle relaxation, and visualization). Balance handout provided to take home. Written material given at graduation. Flowsheet Row Pulmonary Rehab from 11/03/2021 in [facility] Cardiac and Pulmonary Rehab  Date 10/27/21  Educator AS  Instruction Review Code 1- Verbalizes Understanding       Activity Barriers & Risk Stratification:  Activity Barriers & Cardiac Risk Stratification - 06/19/22 1445       Activity Barriers & Cardiac Risk Stratification   Activity Barriers Shortness of Breath;Deconditioning;Back Problems;Muscular Weakness;Balance Concerns;Assistive Device;Other (comment);Joint Problems    Comments neuropathy in feet, back and hip pain             6 Minute Walk:  6 Minute Walk     Row Name 06/19/22 1443         6  Minute Walk   Phase Initial  Distance 355 feet     Walk Time 3.03 minutes     # of Rest Breaks 2  25 sec, 17 sec, stopped at 3:44     MPH 1.33     METS 0.96     RPE 15     Perceived Dyspnea  2     VO2 Peak 3.38     Symptoms Yes (comment)     Comments hip pain 6/10, back pain 6/10, SOB, fatigue     Resting HR 62 bpm     Resting BP 146/64     Resting Oxygen Saturation  93 %     Exercise Oxygen Saturation  during 6 min walk 80 %     Max Ex. HR 74 bpm     Max Ex. BP 156/64     2 Minute Post BP 126/64       Interval HR   1 Minute HR 69     2 Minute HR 73     3 Minute HR 74     4 Minute HR 74  stopped at 3:44     2 Minute Post HR 60     Interval Heart Rate? Yes       Interval Oxygen   Interval Oxygen? Yes     Baseline Oxygen Saturation % 93 %  2L pulsed     1 Minute Oxygen Saturation % 89 %     1 Minute Liters of Oxygen 2 L  pulsed     2 Minute Oxygen Saturation % 83 %     2 Minute Liters of Oxygen 2 L     3 Minute Oxygen Saturation % 81 %     3 Minute Liters of Oxygen 3 L     4 Minute Oxygen Saturation % 80 %     4 Minute Liters of Oxygen 3 L     2 Minute Post Oxygen Saturation % 92 %     2 Minute Post Liters of Oxygen 3 L             Oxygen Initial Assessment:  Oxygen Initial Assessment - 07/04/22 1129       Home Oxygen   Home Oxygen Device Home Concentrator;E-Tanks    Sleep Oxygen Prescription Continuous    Liters per minute 3    Home Exercise Oxygen Prescription Pulsed    Liters per minute 3    Home Resting Oxygen Prescription Pulsed    Liters per minute 3    Compliance with Home Oxygen Use Yes      Program Oxygen Prescription   Program Oxygen Prescription Continuous;E-Tanks    Liters per minute 3      Intervention   Short Term Goals To learn and exhibit compliance with exercise, home and travel O2 prescription;To learn and understand importance of monitoring SPO2 with pulse oximeter and demonstrate accurate use of the pulse oximeter.;To learn and  understand importance of maintaining oxygen saturations>88%;To learn and demonstrate proper pursed lip breathing techniques or other breathing techniques. ;To learn and demonstrate proper use of respiratory medications    Long  Term Goals Exhibits compliance with exercise, home  and travel O2 prescription;Verbalizes importance of monitoring SPO2 with pulse oximeter and return demonstration;Maintenance of O2 saturations>88%;Exhibits proper breathing techniques, such as pursed lip breathing or other method taught during program session;Compliance with respiratory medication;Demonstrates proper use of MDI's             Oxygen Re-Evaluation:  Oxygen Re-Evaluation  [place] Name 07/04/22 1129 08/01/22 1124 09/07/22 1128 10/03/22 1126 10/31/22 1307     Program Oxygen Prescription   Program Oxygen Prescription -- Continuous;E-Tanks Continuous;E-Tanks Continuous;E-Tanks Continuous;E-Tanks   Liters per minute -- _0 Home Oxygen   Home Oxygen Device -- Home Concentrator;E-Tanks Home Concentrator;E-Tanks Home Concentrator;E-Tanks Home Concentrator;E-Tanks   Sleep Oxygen Prescription -- Continuous Continuous Continuous Continuous   Liters per minute -- _1 Home Exercise Oxygen Prescription -- Continuous Continuous Continuous Continuous   Liters per minute -- _2 Home Resting Oxygen Prescription -- Continuous Continuous Continuous Continuous   Liters per minute -- _3 Compliance with Home Oxygen Use -- Yes Yes Yes Yes     Goals/Expected Outcomes   Short Term Goals -- To learn and exhibit compliance with exercise, home and travel O2 prescription;To learn and understand importance of monitoring SPO2 with pulse oximeter and demonstrate accurate use of the pulse oximeter.;To learn and understand importance of maintaining oxygen saturations>88%;To learn and demonstrate proper pursed lip breathing techniques or other breathing techniques. ;To learn and demonstrate proper use of  respiratory medications To learn and exhibit compliance with exercise, home and travel O2 prescription;To learn and understand importance of monitoring SPO2 with pulse oximeter and demonstrate accurate use of the pulse oximeter.;To learn and understand importance of maintaining oxygen saturations>88%;To learn and demonstrate proper pursed lip breathing techniques or other breathing techniques. ;To learn and demonstrate proper use of respiratory medications To learn and exhibit compliance with exercise, home and travel O2 prescription;To learn and understand importance of monitoring SPO2 with pulse oximeter and demonstrate accurate use of the pulse oximeter.;To learn and understand importance of maintaining oxygen saturations>88%;To learn and demonstrate proper pursed lip breathing techniques or other breathing techniques. ;To learn and demonstrate proper use of respiratory medications To learn and exhibit compliance with exercise, home and travel O2 prescription;To learn and understand importance of monitoring SPO2 with pulse oximeter and demonstrate accurate use of the pulse oximeter.;To learn and understand importance of maintaining oxygen saturations>88%;To learn and demonstrate proper pursed lip breathing techniques or other breathing techniques. ;To learn and demonstrate proper use of respiratory medications   Long  Term Goals -- Exhibits compliance with exercise, home  and travel O2 prescription;Verbalizes importance of monitoring SPO2 with pulse oximeter and return demonstration;Maintenance of O2 saturations>88%;Exhibits proper breathing techniques, such as pursed lip breathing or other method taught during program session;Compliance with respiratory medication;Demonstrates proper use of MDI's Exhibits compliance with exercise, home  and travel O2 prescription;Verbalizes importance of monitoring SPO2 with pulse oximeter and return demonstration;Maintenance of O2 saturations>88%;Exhibits proper breathing  techniques, such as pursed lip breathing or other method taught during program session;Compliance with respiratory medication;Demonstrates proper use of MDI's Exhibits compliance with exercise, home  and travel O2 prescription;Verbalizes importance of monitoring SPO2 with pulse oximeter and return demonstration;Maintenance of O2 saturations>88%;Exhibits proper breathing techniques, such as pursed lip breathing or other method taught during program session;Compliance with respiratory medication;Demonstrates proper use of MDI's Exhibits compliance with exercise, home  and travel O2 prescription;Verbalizes importance of monitoring SPO2 with pulse oximeter and return demonstration;Maintenance of O2 saturations>88%;Exhibits proper breathing techniques, such as pursed lip breathing or other method taught during program session;Compliance with respiratory medication;Demonstrates proper use of MDI's   Comments Darius Norris continues to use his oxygen as directed. He is recovering from double pneumonia and feels much better than before. He continues to watch his O2 and  knows to notify his doctor of any abnormal changes. Retierated PLB. He is staying compliant with his medications and denies having any questions on how to use or take his respiratory medications. Darius Norris is doing well with his oxygen therapy.  He had a visit from oxygen company today as they checked up on his equipment.  They said all was good.  He continues to use his PLB and nose strips to help with air flow.  His saturations have continued to do well when he checks at home. Darius Norris is doing well with his oxygen therapy. His breathing is not as good right now and chronic sinus issues have been getting worse recently; he has an appointment Monday and would like to ask them for a chest x-ray as he is afraid he has pneumonia again; he had it before in June. He continues to use PLB and nose strips to help with shortness of breath and he feels it is helpful. He rpeorts his  oxygen has been doing well when he checks it at home. Darius Norris is doing well with his oxygen therapy. He reports that he has had a lot of drainage which has been causing him a little trouble with his breathing. He is still on continuous 3L of oxygen for exercise and is doing well. He continues to use PLB and nose strips to help with shortness of breath and he feels it is helpful. He rpeorts his oxygen has been doing well when he checks it at home. Darius Norris continues to work on his breathing .  He is doing well with his PLB and staying compliant with his oxygen.  He does use his nose strips to help.  He is good at monitoring his saturations.   Goals/Expected Outcomes Short: Continue to monitor O2 closely Long: Continue long-term compliance with PLB and symptom monitoring Short; Continue to use PLB to help with breathing Long: conitnued compliance Short: speak with doctor about increased shortness of breath Long: conitnued compliance Short: continue to check oxygen saturation at home. Long: conitnued compliance Short: continue to check oxygen saturation at home. Long: conitnued compliance            Oxygen Discharge (Final Oxygen Re-Evaluation):  Oxygen Re-Evaluation - 10/31/22 1307       Program Oxygen Prescription   Program Oxygen Prescription Continuous;E-Tanks    Liters per minute 3      Home Oxygen   Home Oxygen Device Home Concentrator;E-Tanks    Sleep Oxygen Prescription Continuous    Liters per minute 3    Home Exercise Oxygen Prescription Continuous    Liters per minute 3    Home Resting Oxygen Prescription Continuous    Liters per minute 3    Compliance with Home Oxygen Use Yes      Goals/Expected Outcomes   Short Term Goals To learn and exhibit compliance with exercise, home and travel O2 prescription;To learn and understand importance of monitoring SPO2 with pulse oximeter and demonstrate accurate use of the pulse oximeter.;To learn and understand importance of maintaining oxygen  saturations>88%;To learn and demonstrate proper pursed lip breathing techniques or other breathing techniques. ;To learn and demonstrate proper use of respiratory medications    Long  Term Goals Exhibits compliance with exercise, home  and travel O2 prescription;Verbalizes importance of monitoring SPO2 with pulse oximeter and return demonstration;Maintenance of O2 saturations>88%;Exhibits proper breathing techniques, such as pursed lip breathing or other method taught during program session;Compliance with respiratory medication;Demonstrates proper use of MDI's    Comments Darius Norris  continues to work on his breathing .  He is doing well with his PLB and staying compliant with his oxygen.  He does use his nose strips to help.  He is good at monitoring his saturations.    Goals/Expected Outcomes Short: continue to check oxygen saturation at home. Long: conitnued compliance             Initial Exercise Prescription:  Initial Exercise Prescription - 06/19/22 1400       Date of Initial Exercise RX and Referring Provider   Date 06/19/22    Referring Provider Delorise Jackson MD   Dr. Jenne Pane ([facility])     Oxygen   Oxygen Continuous    Liters 3    Maintain Oxygen Saturation 88% or higher      Recumbant Bike   Level 1    RPM 50    Watts 10    Minutes 15    METs 1.5      NuStep   Level 1    SPM 80    Minutes 15    METs 1.5      REL-XR   Level 1    Speed 50    Minutes 15    METs 1.5      T5 Nustep   Level 1    SPM 80    Minutes 15    METs 1.5      Biostep-RELP   Level 1    SPM 50    Minutes 15    METs 2      Track   Laps 5    Minutes 15    METs 1.27      Prescription Details   Frequency (times per week) 2    Duration Progress to 30 minutes of continuous aerobic without signs/symptoms of physical distress      Intensity   THRR 40-80% of Max Heartrate 97-132    Ratings of Perceived Exertion 11-13    Perceived Dyspnea 0-4      Progression   Progression  Continue to progress workloads to maintain intensity without signs/symptoms of physical distress.      Resistance Training   Training Prescription Yes    Weight 3 kb    Reps 10-15             Perform Capillary Blood Glucose checks as needed.  Exercise Prescription Changes:   Exercise Prescription Changes     Row Name 06/19/22 1400 06/28/22 1200 07/12/22 1300 07/18/22 1400 07/27/22 1500     Response to Exercise   Blood Pressure (Admit) 146/64 116/64 128/64 -- 114/64   Blood Pressure (Exercise) 156/64 138/60 134/82 -- 132/82   Blood Pressure (Exit) 128/60 128/52 116/64 -- 122/64   Heart Rate (Admit) 62 bpm 62 bpm 67 bpm -- 56 bpm   Heart Rate (Exercise) 74 bpm 64 bpm 68 bpm -- 81 bpm   Heart Rate (Exit) 60 bpm 61 bpm 62 bpm -- 63 bpm   Oxygen Saturation (Admit) 93 % 95 % 90 % -- 94 %   Oxygen Saturation (Exercise) 80 % 96 % 96 % -- 88 %   Oxygen Saturation (Exit) 96 % 94 % 99 % -- 96 %   Rating of Perceived Exertion (Exercise) _0 -- 13   Perceived Dyspnea (Exercise) 2 -- 2 -- 1   Symptoms hip and back pain 6/10, SOB SOB SOB -- SOB   Comments walk test results second full day of exercise -- -- --  Duration -- Progress to 30 minutes of  aerobic without signs/symptoms of physical distress Continue with 30 min of aerobic exercise without signs/symptoms of physical distress. -- Continue with 30 min of aerobic exercise without signs/symptoms of physical distress.   Intensity -- THRR unchanged THRR unchanged -- THRR unchanged     Progression   Progression -- Continue to progress workloads to maintain intensity without signs/symptoms of physical distress. Continue to progress workloads to maintain intensity without signs/symptoms of physical distress. -- Continue to progress workloads to maintain intensity without signs/symptoms of physical distress.   Average METs -- 1.95 2 -- 2.06     Resistance Training   Training Prescription -- Yes Yes -- Yes   Weight -- 4 lb 4 lb -- 4  lb   Reps -- 10-15 10-15 -- 10-15     Interval Training   Interval Training -- No No -- No     Oxygen   Oxygen -- Continuous Continuous -- Continuous   Liters -- 3 3 -- 3     Treadmill   MPH -- 1 1.2 -- 1.4   Grade -- 0 0 -- 0   Minutes -- 15 15 -- 15   METs -- 1.77 1.92 -- 2.07     Recumbant Bike   Level -- -- 2 -- 2   Minutes -- -- 15 -- 15   METs -- -- -- -- 2.37     NuStep   Level -- 4 -- -- --   Minutes -- 15 -- -- --   METs -- 2.1 -- -- --     REL-XR   Level -- 1 -- -- 3   Minutes -- 15 -- -- 15   METs -- -- -- -- 2.2     Biostep-RELP   Level -- 1 -- -- --   Minutes -- 15 -- -- --   METs -- 2 -- -- --     Home Exercise Plan   Plans to continue exercise at -- -- -- Home (comment)  Treadmill for Walking; Hand Weights Home (comment)  Treadmill for Walking; Hand Weights   Frequency -- -- -- Add 3 additional days to program exercise sessions. Add 3 additional days to program exercise sessions.   Initial Home Exercises Provided -- -- -- 07/18/22 07/18/22     Oxygen   Maintain Oxygen Saturation -- 88% or higher 88% or higher 88% or higher 88% or higher    Row Name 09/07/22 1500 09/19/22 1400 10/17/22 1400 11/01/22 1100       Response to Exercise   Blood Pressure (Admit) 118/64 128/60 98/58 132/66    Blood Pressure (Exit) 124/62 118/58 112/58 128/70    Heart Rate (Admit) 60 bpm 74 bpm 60 bpm 70 bpm    Heart Rate (Exercise) 70 bpm 82 bpm 79 bpm 76 bpm    Heart Rate (Exit) 61 bpm 64 bpm 59 bpm 68 bpm    Oxygen Saturation (Admit) 95 % 91 % 97 % 97 %    Oxygen Saturation (Exercise) 94 % 89 % 95 % 95 %    Oxygen Saturation (Exit) 98 % 98 % 98 % 98 %    Rating of Perceived Exertion (Exercise) _0 Perceived Dyspnea (Exercise) _1 Symptoms SOB SOB SOB SOB    Duration Continue with 30 min of aerobic exercise without signs/symptoms of physical distress. Continue with 30 min of aerobic exercise without signs/symptoms of  physical distress. Continue with  30 min of aerobic exercise without signs/symptoms of physical distress. Continue with 30 min of aerobic exercise without signs/symptoms of physical distress.    Intensity THRR unchanged THRR unchanged THRR unchanged THRR unchanged      Progression   Progression Continue to progress workloads to maintain intensity without signs/symptoms of physical distress. Continue to progress workloads to maintain intensity without signs/symptoms of physical distress. Continue to progress workloads to maintain intensity without signs/symptoms of physical distress. Continue to progress workloads to maintain intensity without signs/symptoms of physical distress.    Average METs 2.34 2.53 2.73 2.28      Resistance Training   Training Prescription Yes Yes Yes Yes    Weight 4 lb 4 lb 4 lb 4 lb    Reps 10-15 10-15 10-15 10-15      Interval Training   Interval Training No No No No      Oxygen   Oxygen Continuous Continuous Continuous Continuous    Liters _0 Treadmill   MPH 1.3 1.8 1.8 1.2    Grade 0 0 0 0.5    Minutes _1 METs 1.99 2.38 2.38 2      Recumbant Bike   Level -- 3 3 --    Watts -- -- 20 --    Minutes -- 15 15 --    METs -- 2.66 -- --      NuStep   Level -- -- 3 2    Minutes -- -- 15 15    METs -- -- 2.2 2.3      REL-XR   Level _2 Minutes _3 METs 2.7 2.7 5.8 --      Home Exercise Plan   Plans to continue exercise at Home (comment)  Treadmill for Walking; Hand Weights Home (comment)  Treadmill for Walking; Hand Weights Home (comment)  Treadmill for Walking; Hand Weights Home (comment)  Treadmill for Walking; Hand Weights    Frequency Add 3 additional days to program exercise sessions. Add 3 additional days to program exercise sessions. Add 3 additional days to program exercise sessions. Add 3 additional days to program exercise sessions.    Initial Home Exercises Provided 07/18/22 07/18/22 07/18/22 07/18/22      Oxygen   Maintain Oxygen  Saturation 88% or higher 88% or higher 88% or higher 88% or higher             Exercise Comments:   Exercise Comments     Row Name 06/22/22 1046           Exercise Comments First full day of exercise!  Patient was oriented to gym and equipment including functions, settings, policies, and procedures.  Patient's individual exercise prescription and treatment plan were reviewed.  All starting workloads were established based on the results of the 6 minute walk test done at initial orientation visit.  The plan for exercise progression was also introduced and progression will be customized based on patient's performance and goals.                Exercise Goals and Review:   Exercise Goals     Row Name 06/19/22 1449             Exercise Goals   Increase Physical Activity Yes       Intervention Provide advice, education, support and counseling about  physical activity/exercise needs.;Develop an individualized exercise prescription for aerobic and resistive training based on initial evaluation findings, risk stratification, comorbidities and participant's personal goals.       Expected Outcomes Short Term: Attend rehab on a regular basis to increase amount of physical activity.;Long Term: Add in home exercise to make exercise part of routine and to increase amount of physical activity.;Long Term: Exercising regularly at least 3-5 days a week.       Increase Strength and Stamina Yes       Intervention Provide advice, education, support and counseling about physical activity/exercise needs.;Develop an individualized exercise prescription for aerobic and resistive training based on initial evaluation findings, risk stratification, comorbidities and participant's personal goals.       Expected Outcomes Short Term: Increase workloads from initial exercise prescription for resistance, speed, and METs.;Short Term: Perform resistance training exercises routinely during rehab and add in  resistance training at home;Long Term: Improve cardiorespiratory fitness, muscular endurance and strength as measured by increased METs and functional capacity (6MWT)       Able to understand and use rate of perceived exertion (RPE) scale Yes       Intervention Provide education and explanation on how to use RPE scale       Expected Outcomes Short Term: Able to use RPE daily in rehab to express subjective intensity level;Long Term:  Able to use RPE to guide intensity level when exercising independently       Able to understand and use Dyspnea scale Yes       Intervention Provide education and explanation on how to use Dyspnea scale       Expected Outcomes Short Term: Able to use Dyspnea scale daily in rehab to express subjective sense of shortness of breath during exertion;Long Term: Able to use Dyspnea scale to guide intensity level when exercising independently       Knowledge and understanding of Target Heart Rate Range (THRR) Yes       Intervention Provide education and explanation of THRR including how the numbers were predicted and where they are located for reference       Expected Outcomes Short Term: Able to state/look up THRR;Short Term: Able to use daily as guideline for intensity in rehab;Long Term: Able to use THRR to govern intensity when exercising independently       Able to check pulse independently Yes       Intervention Provide education and demonstration on how to check pulse in carotid and radial arteries.;Review the importance of being able to check your own pulse for safety during independent exercise       Expected Outcomes Short Term: Able to explain why pulse checking is important during independent exercise;Long Term: Able to check pulse independently and accurately       Understanding of Exercise Prescription Yes       Intervention Provide education, explanation, and written materials on patient's individual exercise prescription       Expected Outcomes Short Term: Able to  explain program exercise prescription;Long Term: Able to explain home exercise prescription to exercise independently                Exercise Goals Re-Evaluation :  Exercise Goals Re-Evaluation     Row Name 06/22/22 1046 06/28/22 1241 07/04/22 1124 07/12/22 1322 07/27/22 1505     Exercise Goal Re-Evaluation   Exercise Goals Review -- Increase Physical Activity;Increase Strength and Stamina;Understanding of Exercise Prescription Increase Physical Activity;Increase Strength and Stamina;Understanding of Exercise Prescription  Increase Physical Activity;Increase Strength and Stamina;Understanding of Exercise Prescription Increase Physical Activity;Increase Strength and Stamina;Understanding of Exercise Prescription   Comments Reviewed RPE and dyspnea scales, THR and program prescription with pt today.  Pt voiced understanding and was given a copy of goals to take home. Darius Norris has completed his first two full days of exercise again.  He was able to walk some.  We will conitnue to monitor his progress. Darius Norris just started rehab not too long ago and has not done any home exercise yet. He has a treadmill at home that he plans to use. EP will go over home exercise with patient soon once he is further into the program. 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 We did review key points on home exercise: reviewing THR, monitoring HR & O2, PLB, monitoring intensity. Darius Norris is off to a good start in rehab.  He is up to 1.2 mph on the treadmill and prefers treadmill to track.  He is also using 4 lb weights.  We will continue to montior his progress. Darius Norris is doing well in rehab. He recently improved his overall average MET level to 2.06 METs. He also improved his speed on the treadmill to 1.4 mph. He has done well with 4 lb hand weights for resistance training as well. We will continue to monitor his progress in the program.   Expected Outcomes Short: Use RPE daily to regulate intensity. Long: Follow program  prescription in THR. Short: Continue to monitor rehab regularly Long: Continue to follow program prescription Short: EP to go over home exercise Long: Exercise independently at home at appropriate prescription Short: Continue to increase workload on treadmill, try some incline Long: conitnue to improve stamina Short: Attempt to add incline on the treadmill while maintaining speed. Long: conitnue to improve strength and stamina.    [place] Name 08/01/22 1116 09/07/22 1128 09/19/22 1409 10/02/22 1314 10/03/22 1115     Exercise Goal Re-Evaluation   Exercise Goals Review Increase Physical Activity;Increase Strength and Stamina;Understanding of Exercise Prescription Increase Physical Activity;Increase Strength and Stamina;Understanding of Exercise Prescription Increase Physical Activity;Increase Strength and Stamina;Understanding of Exercise Prescription Increase Physical Activity;Increase Strength and Stamina;Understanding of Exercise Prescription Increase Physical Activity;Increase Strength and Stamina;Understanding of Exercise Prescription   Comments Darius Norris is doing well in rehab.  He is moving around the house more, but not using his treadmill.  He knows he needs to and will try to get back to it.  He does feel like his stamina is starting to improve.  He is still limited in his activity with his tailbone and hip. Darius Norris is not exercising much at home right now, he tries to stay active around the house, but he is not using his treadmill. He is interested in the [facility] - gave him information. Darius Norris continues to do well in rehab. He recently increased his overall average MET level to 2.53 METs. He also increased his workload on the treadmill to a speed of 1.8 mph and no incline. He also improved to level 3 on the recumebnt bike. We will continue to monitor his progress. Darius Norris has not been here since last review. He had a bad reaction to a shot and has not been here since 9/28. Staff has attempted to call patient for  updates and will continue to do so. Today was Mike's first day back at rehab since 9/28. He was out due to a bad reaction to a shot. He states that he was able to get on the treadmill a little  at home during his time away from rehab. He is having some foot swelling that has bothered him, but has an appointment to get that cheched out. We will continue to monitor his progress as he gets back into his regular attendance at rehab.   Expected Outcomes Short: Get back on treadmill again Long: Continue to improve stamina Short: Get back on treadmill again, check out [facility] Long: Continue to improve stamina Short: Continue to increase workloads and work towards more walking. Long: Continue to increase strength and stamina. Short: Return to rehab when ready Long: Continue to build up overall strength and stmina Short: Return to regular attendance in rehab. Long: Continue to build up overall strength and stamina.    [place] Name 10/17/22 1427 10/31/22 1212 10/31/22 1242 11/01/22 1208       Exercise Goal Re-Evaluation   Exercise Goals Review Increase Physical Activity;Increase Strength and Stamina;Understanding of Exercise Prescription Increase Physical Activity;Increase Strength and Stamina;Understanding of Exercise Prescription Increase Physical Activity;Increase Strength and Stamina;Understanding of Exercise Prescription Increase Physical Activity;Increase Strength and Stamina;Understanding of Exercise Prescription    Comments Darius Norris is doing well in rehab since returning, after being out due to a bad reaction to a shot. He increased his overall average MET level to 2.73 METs. He also has been walking on the treadmill at a speed of 1.8 mph with no incline. He has done well on the XR and T4 at level 3 as well. We will continue to monitor his progress in the program. -- Darius Norris is doing well in rehab.  He is still using his treadmill at home for exercise 2x a week for 15-20 min.  He is feeling better overall.  He is due for  his post walk soon and we expect to see an improvement in his distance. Darius Norris is getting close to 30 sessions which means he will be due for his post 6MWT. He dropped his speed on the treadmill due to SOB and we hope to see that go up overtime.  His oxygen saturations are staying well above 88%, but not quite hitting his THR. He is doing well with level 3 on the XR. We will continue to monitor.    Expected Outcomes Short: Continue to increase workloads as tolerated. Long: Continue to build up overall strength and stamina. -- Short: Improve post walk test Long: Conitnue to improve stamina Short: Improve on post 6MWT Long: Contine to increase overall MET level             Discharge Exercise Prescription (Final Exercise Prescription Changes):  Exercise Prescription Changes - 11/01/22 1100       Response to Exercise   Blood Pressure (Admit) 132/66    Blood Pressure (Exit) 128/70    Heart Rate (Admit) 70 bpm    Heart Rate (Exercise) 76 bpm    Heart Rate (Exit) 68 bpm    Oxygen Saturation (Admit) 97 %    Oxygen Saturation (Exercise) 95 %    Oxygen Saturation (Exit) 98 %    Rating of Perceived Exertion (Exercise) 13    Perceived Dyspnea (Exercise) 3    Symptoms SOB    Duration Continue with 30 min of aerobic exercise without signs/symptoms of physical distress.    Intensity THRR unchanged      Progression   Progression Continue to progress workloads to maintain intensity without signs/symptoms of physical distress.    Average METs 2.28      Resistance Training   Training Prescription Yes    Weight  4 lb    Reps 10-15      Interval Training   Interval Training No      Oxygen   Oxygen Continuous    Liters 3      Treadmill   MPH 1.2    Grade 0.5    Minutes 15    METs 2      NuStep   Level 2    Minutes 15    METs 2.3      REL-XR   Level 3    Minutes 15      Home Exercise Plan   Plans to continue exercise at Home (comment)   Treadmill for Walking; Hand Weights    Frequency Add 3 additional days to program exercise sessions.    Initial Home Exercises Provided 07/18/22      Oxygen   Maintain Oxygen Saturation 88% or higher             Nutrition:  Target Goals: Understanding of nutrition guidelines, daily intake of sodium <1559m, cholesterol <2054m calories 30% from fat and 7% or less from saturated fats, daily to have 5 or more servings of fruits and vegetables.  Education: All About Nutrition: -Group instruction provided by verbal, written material, interactive activities, discussions, models, and posters to present general guidelines for heart healthy nutrition including fat, fiber, MyPlate, the role of sodium in heart healthy nutrition, utilization of the nutrition label, and utilization of this knowledge for meal planning. Follow up email sent as well. Written material given at graduation. [facility]  Education need identified 06/19/22       Biometrics:  Pre Biometrics - 06/19/22 1451       Pre Biometrics   Height 5' 5.6" (1.666 m)    Weight 184 lb 4.8 oz (83.6 kg)    BMI (Calculated) 30.12    Single Leg Stand 0 seconds              Nutrition Therapy Plan and Nutrition Goals:  Nutrition Therapy & Goals - 09/07/22 1126       Nutrition Therapy   RD appointment deferred Yes      Personal Nutrition Goals   Nutrition Goal MiRonalee Beltsould not like to speak with RD at this time; will continue to follow up.             Nutrition Assessments:  MEDIFICTS Score Key: ?70 Need to make dietary changes  40-70 Heart Healthy Diet ? 40 Therapeutic Level Cholesterol Diet  [facility]  Picture Your Plate Total Score on Admission 52      Picture Your Plate Scores: <4<58nhealthy dietary pattern with much room for improvement. 41-50 Dietary pattern unlikely to meet recommendations for good  health and room for improvement. 51-60 More healthful dietary pattern, with some room for improvement.  >60 Healthy dietary pattern, although there may be some specific behaviors that could be improved.   Nutrition Goals Re-Evaluation:  Nutrition Goals Re-Evaluation     Row Name 07/04/22 1159 08/01/22 1120 10/03/22 1118 10/31/22 1257       Goals   Nutrition Goal -- MiRonalee Beltsould not like to speak with RD at this time; will continue to follow up. MiRonalee Beltsould not like to speak with RD at this time; will continue to follow up. Short: Continue to work on portion control Long: continue to add in more vegetables  Comment Patient has yet to meet with the RD at this time. He is not sure if he would like to make an appointment, Darius Norris is doesn't want to meet with RD.  He is sticking to lean proteins.  He likes beans and potatoes.  His favorite is catelope especially during this time of the year.  He enjoys fruits like grapes too throughout the year.  He continues to work on portion control Darius Norris is doesn't want to meet with RD.  He is sticking to lean proteins.  He likes beans and potatoes.  His favorite is catelope especially during this time of the year.  He enjoys fruits like grapes too throughout the year.  He continues to work on portion control Darius Norris is doing better with his diet.  His wife is now working on hers too and it makes it easier for both of them to eat right.  They are watching sodium and sugar closely.  Darius Norris continues to work on portion control too.  They are also working on adding in more variety.    Expected Outcome Short: Schedule appt with RD if interested Long: Follow healthy pulmonary based diet Short: Continue to work on portion control Long: continue to add in more vegetables Short: Continue to work on portion control Long: continue to add in more vegetables Short: Reduce sugar and salt Long: Conitnue to add in more variety             Nutrition Goals Discharge (Final Nutrition Goals  Re-Evaluation):  Nutrition Goals Re-Evaluation - 10/31/22 1257       Goals   Nutrition Goal Short: Continue to work on portion control Long: continue to add in more vegetables    Comment Darius Norris is doing better with his diet.  His wife is now working on hers too and it makes it easier for both of them to eat right.  They are watching sodium and sugar closely.  Darius Norris continues to work on portion control too.  They are also working on adding in more variety.    Expected Outcome Short: Reduce sugar and salt Long: Conitnue to add in more variety             Psychosocial: Target Goals: Acknowledge presence or absence of significant depression and/or stress, maximize coping skills, provide positive support system. Participant is able to verbalize types and ability to use techniques and skills needed for reducing stress and depression.   Education: Stress, Anxiety, and Depression - Group verbal and visual presentation to define topics covered.  Reviews how body is impacted by stress, anxiety, and depression.  Also discusses healthy ways to reduce stress and to treat/manage anxiety and depression.  Written material given at graduation. Flowsheet Row Pulmonary Rehab from 11/03/2021 in [facility]  Date 07/28/21  Educator [facility]  Instruction Review Code 1- [employer] Understanding       Education: Sleep Hygiene -Provides group verbal and written instruction about how sleep can affect your health.  Define sleep hygiene, discuss sleep cycles and impact of sleep habits. Review good sleep hygiene tips.    Initial Review & Psychosocial Screening:   Quality of Life Scores:  Scores of 19 and below usually indicate a poorer quality of life in these areas.  A difference of  2-3 points is a clinically meaningful difference.  A difference of 2-3 points in the total score of the Quality of Life Index has been associated with significant improvement in overall quality of life, self-image,  physical symptoms, and general health in studies assessing change in quality of life.  PHQ-9: Review Flowsheet  More data exists      07/18/2022 06/19/2022 11/17/2021 06/13/2021 02/26/2019  Depression screen PHQ 2/9  Decreased Interest _0 0 1  Down, Depressed, Hopeless 0 1 0 0 0  PHQ - 2 Score _1 0 1  Altered sleeping _2 0 2  Tired, decreased energy _3 Change in appetite 0 0 1 0 1  Feeling bad or failure about yourself  0 0 0 0 0  Trouble concentrating 0 0 0 0 0  Moving slowly or fidgety/restless 0 0 0 0 0  Suicidal thoughts 0 0 0 0 0  PHQ-9 Score _4 Difficult doing work/chores Somewhat difficult Not difficult at all Somewhat difficult - Somewhat difficult   Interpretation of Total Score  Total Score Depression Severity:  1-4 = Minimal depression, 5-9 = Mild depression, 10-14 = Moderate depression, 15-19 = Moderately severe depression, 20-27 = Severe depression   Psychosocial Evaluation and Intervention:   Psychosocial Re-Evaluation:  Psychosocial Re-Evaluation     Row Name 07/04/22 1131 08/01/22 1117 09/07/22 1137 10/03/22 1119 10/31/22 1245     Psychosocial Re-Evaluation   Current issues with Current Sleep Concerns;Current Stress Concerns Current Sleep Concerns;Current Stress Concerns Current Sleep Concerns;Current Stress Concerns Current Sleep Concerns;Current Stress Concerns Current Sleep Concerns;Current Stress Concerns   Comments Darius Norris is just getting starting in rehab. He states his sleep is not great as he has trouble falling asleep, however, it has always been like this for a while. He takes melatonin to help but not too interested in taking something else. We talked about trying guided meditations and relaxation before bed. Discussed calm. com that has variation of meditation and deep breathing. He has some stress, but feels he tolerates it well. He is staying compliant with his medications and feels they are helping. Darius Norris is doing well in rehab. His hip  and tailbone are still limiting his activity.  He bought a new mattress 3 months ago and he is ready to get rid of it as he is hurting more and his sheets don't fit.  He is upset that he spent so much money and it not working for him.  He has slats to support it as well as a box spring.  He is going to try to get rid of those.  His breathing is also limiting him in how much he is able to do.  He tries to use breathing to help focus and release stress. Darius Norris reports he is still having issues sleeping and his wife has been snoring which has been bothering him; he reports not usually being able to sleep well. Darius Norris reports no major stress concerns, but his cat passed away last week - he continues to maintain a positive attitude. He talks to his wife, but doesn't feel like he has a good support system to rely on. He used to enjoy fishing, but he had to give that up with his continued shortness of breath. Darius Norris enjoys coming to rehab and feels it gives him some social interaction. Darius Norris reports some stress in his life with everything going on but continues to maintain a positive outlook. He states that he is still having trouble sleeping, due to him having to get up every few hours to use the restroom. He also reports having a good support system at home made up  primarily by his wife. Darius Norris is doing well in rehab.  He is sleeping a little bit better now.  He and his wife are now sleeping in separate rooms and it has helped his sleeping.  He is also happier now that she is starting to take better care of herself.  They are now working together on their diet and being more supportive.   Expected Outcomes Short: Work on different relaxation method before bed Long: Maintain posititve attitude Short: Try working on mattress Long: continue to focus on positives Short: continue to attend rehab for mental health boost Long: continue to focus on positives Short: continue to attend rehab for mental health boost. Long: continue to  maintain positive outlook. Short: Conitnue to work with wife  Long: Continue to sleep better   Interventions Encouraged to attend Pulmonary Rehabilitation for the exercise -- Encouraged to attend Pulmonary Rehabilitation for the exercise Encouraged to attend Pulmonary Rehabilitation for the exercise Encouraged to attend Pulmonary Rehabilitation for the exercise   Continue Psychosocial Services  Follow up required by staff -- Follow up required by staff Follow up required by staff Follow up required by staff            Psychosocial Discharge (Final Psychosocial Re-Evaluation):  Psychosocial Re-Evaluation - 10/31/22 1245       Psychosocial Re-Evaluation   Current issues with Current Sleep Concerns;Current Stress Concerns    Comments Darius Norris is doing well in rehab.  He is sleeping a little bit better now.  He and his wife are now sleeping in separate rooms and it has helped his sleeping.  He is also happier now that she is starting to take better care of herself.  They are now working together on their diet and being more supportive.    Expected Outcomes Short: Conitnue to work with wife  Long: Continue to sleep better    Interventions Encouraged to attend Pulmonary Rehabilitation for the exercise    Continue Psychosocial Services  Follow up required by staff             Education: Education Goals: Education classes will be provided on a weekly basis, covering required topics. Participant will state understanding/return demonstration of topics presented.  Learning Barriers/Preferences:   General Pulmonary Education Topics:  Infection Prevention: - Provides verbal and written material to individual with discussion of infection control including proper hand washing and proper equipment cleaning during exercise session. Flowsheet Row Pulmonary Rehab from 07/27/2022 in [facility] Cardiac and Pulmonary Rehab  Date 05/08/22  Educator [facility]  Instruction Review Code 1- Verbalizes Understanding        Falls Prevention: - Provides verbal and written material to individual with discussion of falls prevention and safety. Flowsheet Row Pulmonary Rehab from 07/27/2022 in [facility] Cardiac and Pulmonary Rehab  Date 05/08/22  Educator [facility]  Instruction Review Code 1- Verbalizes Understanding       Chronic Lung Disease Review: - Group verbal instruction with posters, models, PowerPoint presentations and videos,  to review new updates, new respiratory medications, new advancements in procedures and treatments. Providing information on websites and "800" numbers for continued self-education. Includes information about supplement oxygen, available portable oxygen systems, continuous and intermittent flow rates, oxygen safety, concentrators, and Medicare reimbursement for oxygen. Explanation of Pulmonary Drugs, including class, frequency, complications, importance of spacers, rinsing mouth after steroid MDI's, and proper cleaning methods for nebulizers. Review of basic lung anatomy and physiology related to function, structure, and complications of lung disease. Review of risk factors. Discussion about  methods for diagnosing sleep apnea and types of masks and machines for OSA. Includes a review of the use of types of environmental controls: home humidity, furnaces, filters, dust mite/pet prevention, HEPA vacuums. Discussion about weather changes, air quality and the benefits of nasal washing. Instruction on Warning signs, infection symptoms, calling MD promptly, preventive modes, and value of vaccinations. Review of effective airway clearance, coughing and/or vibration techniques. Emphasizing that all should Create an Action Plan. Written material given at graduation. Flowsheet Row Pulmonary Rehab from 11/03/2021 in [facility] Cardiac and Pulmonary Rehab  Date 09/22/21  Educator [facility]  Instruction Review Code 1- Verbalizes Understanding       AED/CPR: - Group verbal and written instruction with the use of models to  demonstrate the basic use of the AED with the basic ABC's of resuscitation. Flowsheet Row Pulmonary Rehab from 02/26/2019 in [facility] Cardiac and Pulmonary Rehab  Date 01/01/19  Educator [facility]  Instruction Review Code 1- Verbalizes Understanding        Anatomy and Cardiac Procedures: - Group verbal and visual presentation and models provide information about basic cardiac anatomy and function. Reviews the testing methods done to diagnose heart disease and the outcomes of the test results. Describes the treatment choices: Medical Management, Angioplasty, or Coronary Bypass Surgery for treating various heart conditions including Myocardial Infarction, Angina, Valve Disease, and Cardiac Arrhythmias.  Written material given at graduation.   Medication Safety: - Group verbal and visual instruction to review commonly prescribed medications for heart and lung disease. Reviews the medication, class of the drug, and side effects. Includes the steps to properly store meds and maintain the prescription regimen.  Written material given at graduation. Flowsheet Row Pulmonary Rehab from 11/03/2021 in [facility] Cardiac and Pulmonary Rehab  Date 09/01/21  Educator [facility]  Instruction Review Code 1- Verbalizes Understanding       Other: -Provides group and verbal instruction on various topics (see comments)   Knowledge Questionnaire Score:  Knowledge Questionnaire Score - 06/19/22 1452       Knowledge Questionnaire Score   Pre Score 17/18              Core Components/Risk Factors/Patient Goals at Admission:  Personal Goals and Risk Factors at Admission - 06/19/22 1452       Core Components/Risk Factors/Patient Goals on Admission    Weight Management Yes;Weight Loss;Obesity    Intervention Weight Management: Develop a combined nutrition and exercise program designed to reach desired caloric intake, while maintaining appropriate intake of nutrient and fiber, sodium and fats, and appropriate energy  expenditure required for the weight goal.;Weight Management: Provide education and appropriate resources to help participant work on and attain dietary goals.;Weight Management/Obesity: Establish reasonable short term and long term weight goals.;Obesity: Provide education and appropriate resources to help participant work on and attain dietary goals.    Admit Weight 184 lb 4.8 oz (83.6 kg)    Goal Weight: Short Term 180 lb (81.6 kg)    Goal Weight: Long Term 180 lb (81.6 kg)    Expected Outcomes Short Term: Continue to assess and modify interventions until short term weight is achieved;Long Term: Adherence to nutrition and physical activity/exercise program aimed toward attainment of established weight goal;Weight Loss: Understanding of general recommendations for a balanced deficit meal plan, which promotes 1-2 lb weight loss per week and includes a negative energy balance of [id number] kcal/d;Understanding of distribution of calorie intake throughout the day with the consumption of 4-5 meals/snacks;Understanding recommendations for meals to include 15-35% energy as  protein, 25-35% energy from fat, 35-60% energy from carbohydrates, less than 262m of dietary cholesterol, 20-35 gm of total fiber daily    Intervention Provide education, individualized exercise plan and daily activity instruction to help decrease symptoms of SOB with activities of daily living.    Expected Outcomes Short Term: Improve cardiorespiratory fitness to achieve a reduction of symptoms when performing ADLs;Long Term: Be able to perform more ADLs without symptoms or delay the onset of symptoms    Increase knowledge of respiratory medications and ability to use respiratory devices properly  Yes    Intervention Provide education and demonstration as needed of appropriate use of medications, inhalers, and oxygen therapy.    Expected Outcomes Short Term: Achieves understanding of medications use. Understands that oxygen is a medication  prescribed by physician. Demonstrates appropriate use of inhaler and oxygen therapy.;Long Term: Maintain appropriate use of medications, inhalers, and oxygen therapy.    Diabetes Yes    Intervention Provide education about signs/symptoms and action to take for hypo/hyperglycemia.;Provide education about proper nutrition, including hydration, and aerobic/resistive exercise prescription along with prescribed medications to achieve blood glucose in normal ranges: Fasting glucose 65-99 mg/dL    Expected Outcomes Short Term: Participant verbalizes understanding of the signs/symptoms and immediate care of hyper/hypoglycemia, proper foot care and importance of medication, aerobic/resistive exercise and nutrition plan for blood glucose control.;Long Term: Attainment of HbA1C < 7%.    Hypertension Yes    Intervention Provide education on lifestyle modifcations including regular physical activity/exercise, weight management, moderate sodium restriction and increased consumption of fresh fruit, vegetables, and low fat dairy, alcohol moderation, and smoking cessation.;Monitor prescription use compliance.    Expected Outcomes Short Term: Continued assessment and intervention until BP is < 140/966mHG in hypertensive participants. < 130/8048mG in hypertensive participants with diabetes, heart failure or chronic kidney disease.;Long Term: Maintenance of blood pressure at goal levels.    Lipids Yes    Intervention Provide education and support for participant on nutrition & aerobic/resistive exercise along with prescribed medications to achieve LDL <27m19mDL >40mg41m Expected Outcomes Short Term: Participant states understanding of desired cholesterol values and is compliant with medications prescribed. Participant is following exercise prescription and nutrition guidelines.;Long Term: Cholesterol controlled with medications as prescribed, with individualized exercise RX and with personalized nutrition plan. Value  goals: LDL < 27mg,52m > 40 mg.             Education:Diabetes - Individual verbal and written instruction to review signs/symptoms of diabetes, desired ranges of glucose level fasting, after meals and with exercise. Acknowledge that pre and post exercise glucose checks will be done for 3 sessions at entry of program. Flowsheet Row [facility]  Date 05/08/22  [facility]- Verbal[facility] Understanding       Know Your Numbers and Heart Failure: - Group verbal and visual instruction to discuss disease risk factors for cardiac and pulmonary disease and treatment options.  Reviews associated critical values for Overweight/Obesity, Hypertension, Cholesterol, and Diabetes.  Discusses basics of heart failure: signs/symptoms and treatments.  Introduces Heart Failure Zone chart for action plan for heart failure.  Written material given at graduation. Flowsheet Row [facility]  Date 07/27/22  Educator [facility]  Instruction Review Code 1- Verbalizes Understanding       Core Components/Risk Factors/Patient Goals Review:   Goals and Risk Factor Review  [place] Name 07/04/22 1126 08/01/22 1121 09/07/22 1132 10/03/22 1123 10/31/22 1304     Core Components/Risk Factors/Patient Goals Review   Personal Goals Review Weight Management/Obesity;Diabetes;Hypertension;Improve shortness of breath with ADL's Weight Management/Obesity;Diabetes;Hypertension;Improve shortness of breath with ADL's;Increase knowledge of respiratory medications and ability to use respiratory devices properly. Diabetes;Improve shortness of breath with ADL's;Increase knowledge of respiratory medications and ability to use respiratory devices properly.;Hypertension Diabetes;Improve shortness of breath with ADL's;Increase knowledge of respiratory medications and ability to use respiratory devices  properly.;Hypertension;Weight Management/Obesity Diabetes;Improve shortness of breath with ADL's;Increase knowledge of respiratory medications and ability to use respiratory devices properly.;Hypertension;Weight Management/Obesity   Review Darius Norris just started the program not too long ago. His doctor recently changed his BP medications about 3 weeks ago and has been feeling slightly dizzy from it. He reports BP stays stable and now low. Told him to let them know as soon as possible just in case they need to make changes. He checks his blood sugars once/ week as what he was advised by the doctor and states it is always under 130.Marland Kitchen He has lost 30 lbs since last year which was intentional for the most part. He is looking to lose more, just reiterated to make sure he is not losing muscle mass. He stopped drinking regular soda and switched to diet which he thinks made a huge difference. He stays compliant with medications Darius Norris is doing well in rehab.  His weight is down some and his dieruetic is working well, he just watches his timing of it to make sure he is not out when it hits.  He can tell a difference when he misses his dose as his feet will swell.  [facility].  His pressures are doing well as are his sugars.  He continues to keep eye on them and takes his meds nights. His breathing is doing a little better.  He uses his inhaler routinely but only nebulizer when he feels clogged.  He was encourged to use nebulizer more frequently to help. Darius Norris is doing well  in rehab. His breathing is not as good right now and chronic sinus issues have been getting worse recently; he has an appointment Monday and would like to ask them for a chest x-ray as he is afraid he has pneumonia again; he had it before in June. He continues to use PLB and nose strips to help with shortness of breath and he feels it is helpful. He rpeorts his oxygen has been doing well when he checks it at home.  Darius Norris reports he saw his cardiologist last week and put him on a new medication for his Afib. He doesn't check his BP as often at home anymore- encouraged him to check it at least 2x/week at home. Darius Norris reports his BG has been running ok with metformin <130 fasting; he checks it weekly. He continues to take his medications as prescribed with no issues. Darius Norris rpeorts also having foot pain right now and trouble with his balance. Darius Norris is doing well in rehab. He reports that he has gained a little weight recently due to his medication and overeating. He would like to lose around 5 pounds. He has been checking his blood sugar regularly and states that it has been staying within normal ranges. Darius Norris has not been checking his BP regularly, but does occasionally check it when he feels off.  He states that he is still taking all his medications as prescribed. Darius Norris  continues to work on his weight.  It continues to trend down.  His BP and sugars are doing well overall.  He is pleased with the progress he is making.  His breathing is also getting better overall and he is using his PLB on a routine basis.  He is good with using his nebulizers and inhalers routinely.   Expected Outcomes Short: Monitor BP closely and talk to doctor about dizziness Long: Continue to manage lifestyle risk factors Short: Try to use nebulizer more frequently Long: conitnue to work on weight loss Short: See MD Monday  Long: conitnue to work on weight loss Short: continue to work on weight loss. Long: conitnue to manage lifestyle risk factors. Short: Conitnue to work on weight loss Long; Continue to monitor risk factors            Core Components/Risk Factors/Patient Goals at Discharge (Final Review):   Goals and Risk Factor Review - 10/31/22 1304       Core Components/Risk Factors/Patient Goals Review   Personal Goals Review Diabetes;Improve shortness of breath with ADL's;Increase knowledge of respiratory medications and ability to use  respiratory devices properly.;Hypertension;Weight Management/Obesity    Review Darius Norris continues to work on his weight.  It continues to trend down.  His BP and sugars are doing well overall.  He is pleased with the progress he is making.  His breathing is also getting better overall and he is using his PLB on a routine basis.  He is good with using his nebulizers and inhalers routinely.    Expected Outcomes Short: Conitnue to work on weight loss Long; Continue to monitor risk factors             ITP Comments:  ITP Comments     Row Name 06/19/22 1443 06/22/22 1045 06/28/22 1024 07/26/22 0804 08/23/22 1338   ITP Comments Completed 6MWT and gym orientation. Initial ITP created and sent for review to Dr. Zetta Bills, Medical Director. First full day of exercise!  Patient was oriented to gym and equipment including functions, settings, policies, and procedures.  Patient's individual exercise prescription and treatment plan were reviewed.  All starting workloads were established based on the results of the 6 minute walk test done at initial orientation visit.  The plan for exercise progression was also introduced and progression will be customized based on patient's performance and goals. 30 Day review completed. Medical Director ITP review done, changes made as directed, and signed approval by Medical Director.   NEW 30 Day review completed. Medical Director ITP review done, changes made as directed, and signed approval by Medical Director. 30 Day review completed. Medical Director ITP review done, changes made as directed, and signed approval by Medical Director.    [place] Name 09/20/22 0743 10/18/22 1001 11/15/22 0843       ITP Comments 30 Day review completed. Medical Director ITP review done, changes made as directed, and signed approval by Medical Director. 30 Day review completed. Medical Director ITP review done, changes made as directed, and signed approval by Medical Director. 30 Day review  completed. Medical Director ITP review done, changes made as directed, and signed approval by Medical Director.              Comments:

## 2022-11-16 ENCOUNTER — Encounter: Payer: No Typology Code available for payment source | Admitting: *Deleted

## 2022-11-16 DIAGNOSIS — J449 Chronic obstructive pulmonary disease, unspecified: Secondary | ICD-10-CM | POA: Diagnosis not present

## 2022-11-16 DIAGNOSIS — R0602 Shortness of breath: Secondary | ICD-10-CM

## 2022-11-16 NOTE — Progress Notes (Signed)
Daily Session Note  Patient Details  Name: TATEN MERROW MRN: [id number] Date of Birth: 04-Dec-1951 Referring Provider:   Flowsheet Row Pulmonary Rehab from 06/19/2022 in [facility] Cardiac and Pulmonary Rehab  Referring Provider Delorise Jackson MD  [Dr. Jenne Pane ([facility])]       Encounter Date: 11/16/2022  Check In:  Session Check In - 11/16/22 1159       Check-In   Supervising physician immediately available to respond to emergencies See telemetry face sheet for immediately available ER MD    Location [facility]-Cardiac & Pulmonary Rehab    Staff Present Heath Lark, RN, BSN, CCRP;Meredith Sherryll Burger, RN BSN;Joseph [place], RCP,RRT,BSRT;Noah Tickle, [place], Exercise Physiologist    Virtual Visit No    Medication changes reported     No    Fall or balance concerns reported    No    Warm-up and Cool-down Performed on first and last piece of equipment    Resistance Training Performed Yes    VAD Patient? No    PAD/SET Patient? No      Pain Assessment   Currently in Pain? No/denies                Social History   Tobacco Use  Smoking Status Former   Packs/day: 1.00   Years: 30.00   Total pack years: 30.00   Types: Cigarettes   Quit date: 12/18/2001   Years since quitting: 20.9  Smokeless Tobacco Never    Goals Met:  Proper associated with RPD/PD & O2 Sat Independence with exercise equipment Exercise tolerated well No report of concerns or symptoms today  Goals Unmet:  Not Applicable  Comments: Pt able to follow exercise prescription today without complaint.  Will continue to monitor for progression.    Dr. Emily Filbert is Medical Director for [place].  Dr. Ottie Glazier is Medical Director for [facility] Pulmonary Rehabilitation.

## 2022-11-21 ENCOUNTER — Encounter: Payer: No Typology Code available for payment source | Attending: Anesthesiology | Admitting: *Deleted

## 2022-11-21 DIAGNOSIS — J449 Chronic obstructive pulmonary disease, unspecified: Secondary | ICD-10-CM | POA: Diagnosis present

## 2022-11-21 NOTE — Progress Notes (Signed)
Daily Session Note  Patient Details  Name: Darius Norris MRN: [id number] Date of Birth: July 11, 1951 Referring Provider:   Flowsheet Row Pulmonary Rehab from 06/19/2022 in [facility] Cardiac and Pulmonary Rehab  Referring Provider Delorise Jackson MD  [Dr. Jenne Pane ([facility])]       Encounter Date: 11/21/2022  Check In:  Session Check In - 11/21/22 1131       Check-In   Supervising physician immediately available to respond to emergencies See telemetry face sheet for immediately available ER MD    Location [facility]-Cardiac & Pulmonary Rehab    Staff Present Antionette Fairy, BS, Exercise Physiologist;Jessica Wallace, MA, RCEP, CCRP, CCET;Dontrail Blackwell Sherryll Burger, RN BSN    Virtual Visit No    Medication changes reported     No    Fall or balance concerns reported    No    Warm-up and Cool-down Performed on first and last piece of equipment    Resistance Training Performed Yes    VAD Patient? No    PAD/SET Patient? No      Pain Assessment   Currently in Pain? No/denies                Social History   Tobacco Use  Smoking Status Former   Packs/day: 1.00   Years: 30.00   Total pack years: 30.00   Types: Cigarettes   Quit date: 12/18/2001   Years since quitting: 20.9  Smokeless Tobacco Never    Goals Met:  Independence with exercise equipment Exercise tolerated well No report of concerns or symptoms today Strength training completed today  Goals Unmet:  Not Applicable  Comments: Pt able to follow exercise prescription today without complaint.  Will continue to monitor for progression.    Dr. Emily Filbert is Medical Director for [place].  Dr. Ottie Glazier is Medical Director for [facility] Pulmonary Rehabilitation.

## 2022-11-23 ENCOUNTER — Encounter: Payer: No Typology Code available for payment source | Admitting: *Deleted

## 2022-11-23 VITALS — Ht 65.6 in | Wt 192.7 lb

## 2022-11-23 DIAGNOSIS — J449 Chronic obstructive pulmonary disease, unspecified: Secondary | ICD-10-CM | POA: Diagnosis not present

## 2022-11-23 NOTE — Progress Notes (Signed)
Daily Session Note  Patient Details  Name: Darius Norris MRN: [id number] Date of Birth: 11/20/1951 Referring Provider:   Flowsheet Row Pulmonary Rehab from 06/19/2022 in [facility] Cardiac and Pulmonary Rehab  Referring Provider Delorise Jackson MD  [Dr. Jenne Pane ([facility])]       Encounter Date: 11/23/2022  Check In:  Session Check In - 11/23/22 1049       Check-In   Supervising physician immediately available to respond to emergencies See telemetry face sheet for immediately available ER MD    Location [facility]-Cardiac & Pulmonary Rehab    Staff Present Renita Papa, RN BSN;Jessica Luan Pulling, MA, RCEP, CCRP, Bertram Gala, MS, ACSM CEP, Exercise Physiologist    Virtual Visit No    Medication changes reported     No    Fall or balance concerns reported    No    Warm-up and Cool-down Performed on first and last piece of equipment    Resistance Training Performed Yes    VAD Patient? No    PAD/SET Patient? No      Pain Assessment   Currently in Pain? No/denies                Social History   Tobacco Use  Smoking Status Former   Packs/day: 1.00   Years: 30.00   Total pack years: 30.00   Types: Cigarettes   Quit date: 12/18/2001   Years since quitting: 20.9  Smokeless Tobacco Never    Goals Met:  Independence with exercise equipment Exercise tolerated well No report of concerns or symptoms today Strength training completed today  Goals Unmet:  Not Applicable  Comments: Pt able to follow exercise prescription today without complaint.  Will continue to monitor for progression.    Dr. Emily Filbert is Medical Director for [place].  Dr. Ottie Glazier is Medical Director for [facility] Pulmonary Rehabilitation.

## 2022-11-23 NOTE — Patient Instructions (Signed)
Discharge Patient Instructions  Patient Details  Name: Darius Norris MRN: [id number] Date of Birth: [birth year] Referring Provider:  Pulcifer   Number of Visits: 36  Reason for Discharge:  Patient reached a stable level of exercise. Patient independent in their exercise. Patient has met program and personal goals.  Smoking History:  Social History   Tobacco Use  Smoking Status Former   Packs/day: 1.00   Years: 30.00   Total pack years: 30.00   Types: Cigarettes   Quit date: 12/18/2001   Years since quitting: 20.9  Smokeless Tobacco Never    Diagnosis:  Chronic obstructive pulmonary disease, unspecified COPD type ([place])  Initial Exercise Prescription:  Initial Exercise Prescription - 06/19/22 1400       Date of Initial Exercise RX and Referring Provider   Date 06/19/22    Referring Provider Delorise Jackson MD   Dr. Jenne Pane ([facility])     Oxygen   Oxygen Continuous    Liters 3    Maintain Oxygen Saturation 88% or higher      Recumbant Bike   Level 1    RPM 50    Watts 10    Minutes 15    METs 1.5      NuStep   Level 1    SPM 80    Minutes 15    METs 1.5      REL-XR   Level 1    Speed 50    Minutes 15    METs 1.5      T5 Nustep   Level 1    SPM 80    Minutes 15    METs 1.5      Biostep-RELP   Level 1    SPM 50    Minutes 15    METs 2      Track   Laps 5    Minutes 15    METs 1.27      Prescription Details   Frequency (times per week) 2    Duration Progress to 30 minutes of continuous aerobic without signs/symptoms of physical distress      Intensity   THRR 40-80% of Max Heartrate 97-132    Ratings of Perceived Exertion 11-13    Perceived Dyspnea 0-4      Progression   Progression Continue to progress workloads to maintain intensity without signs/symptoms of physical distress.      Resistance Training   Training Prescription Yes    Weight 3 kb    Reps 10-15             Discharge Exercise  Prescription (Final Exercise Prescription Changes):  Exercise Prescription Changes - 11/16/22 1400       Response to Exercise   Blood Pressure (Admit) 126/72    Blood Pressure (Exit) 126/64    Heart Rate (Admit) 73 bpm    Heart Rate (Exercise) 93 bpm    Heart Rate (Exit) 54 bpm    Oxygen Saturation (Admit) 97 %    Oxygen Saturation (Exercise) 94 %    Oxygen Saturation (Exit) 98 %    Rating of Perceived Exertion (Exercise) 12    Perceived Dyspnea (Exercise) 2    Symptoms SOB    Duration Continue with 30 min of aerobic exercise without signs/symptoms of physical distress.    Intensity THRR unchanged      Progression   Progression Continue to progress workloads to maintain intensity without signs/symptoms of physical distress.    Average METs  1.93      Resistance Training   Training Prescription Yes    Weight 4 lb    Reps 10-15      Interval Training   Interval Training No      Oxygen   Oxygen Continuous    Liters 2-3      Treadmill   MPH 1.2    Grade 0.5    Minutes 15    METs 2      REL-XR   Level 3    Minutes 15      T5 Nustep   Level 4    Minutes 15    METs 1.8      Home Exercise Plan   Plans to continue exercise at Home (comment)   Treadmill for Walking; Hand Weights   Frequency Add 3 additional days to program exercise sessions.    Initial Home Exercises Provided 07/18/22      Oxygen   Maintain Oxygen Saturation 88% or higher             Functional Capacity:  6 Minute Walk     [place] Name 06/19/22 1443 11/23/22 1154       6 Minute Walk   Phase Initial Discharge    Distance 355 feet 415 feet    Distance % Change -- 16.9 %    Distance Feet Change -- 60 ft    Walk Time 3.03 minutes 4.77 minutes    # of Rest Breaks 2  25 sec, 17 sec, stopped at 3:44 5  1:35-1:43; 2:46-2:58;3:08-3:30; 4:32-4:48; 5:30-5:52    MPH 1.33 0.98    METS 0.96 1.07    RPE 15 13    Perceived Dyspnea  2 3    VO2 Peak 3.38 3.76    Symptoms Yes (comment) Yes (comment)     Comments hip pain 6/10, back pain 6/10, SOB, fatigue SOB    Resting HR 62 bpm 68 bpm    Resting BP 146/64 124/62    Resting Oxygen Saturation  93 % 93 %    Exercise Oxygen Saturation  during 6 min walk 80 % 85 %    Max Ex. HR 74 bpm 82 bpm    Max Ex. BP 156/64 154/64    2 Minute Post BP 126/64 128/62      Interval HR   1 Minute HR 69 76    2 Minute HR 73 72    3 Minute HR 74 78    4 Minute HR 74  stopped at 3:44 79    5 Minute HR -- 72    6 Minute HR -- 82    2 Minute Post HR 60 74    Interval Heart Rate? Yes Yes      Interval Oxygen   Interval Oxygen? Yes Yes    Baseline Oxygen Saturation % 93 %  2L pulsed 93 %    1 Minute Oxygen Saturation % 89 % 95 %    1 Minute Liters of Oxygen 2 L  pulsed 3 L  pulsed    2 Minute Oxygen Saturation % 83 % 89 %    2 Minute Liters of Oxygen 2 L 3 L    3 Minute Oxygen Saturation % 81 % 86 %    3 Minute Liters of Oxygen 3 L 3 L    4 Minute Oxygen Saturation % 80 % 85 %    4 Minute Liters of Oxygen 3 L 3 L    5 Minute Oxygen  Saturation % -- 86 %    5 Minute Liters of Oxygen -- 3 L    6 Minute Oxygen Saturation % -- 85 %    6 Minute Liters of Oxygen -- 3 L    2 Minute Post Oxygen Saturation % 92 % 89 %    2 Minute Post Liters of Oxygen 3 L 3 L               Nutrition & Weight - Outcomes:  Pre Biometrics - 06/19/22 1451       Pre Biometrics   Height 5' 5.6" (1.666 m)    Weight 184 lb 4.8 oz (83.6 kg)    BMI (Calculated) 30.12    Single Leg Stand 0 seconds             Post Biometrics - 11/23/22 1154        Post  Biometrics   Height 5' 5.6" (1.666 m)    Weight 192 lb 11.2 oz (87.4 kg)    BMI (Calculated) 31.49    Single Leg Stand 0 seconds             Nutrition:  Nutrition Therapy & Goals - 09/07/22 1126       Nutrition Therapy   RD appointment deferred Yes      Personal Nutrition Goals   Nutrition Goal [name] would not like to speak with RD at this time; will continue to follow up.              Nutrition Discharge:   Education Questionnaire Score:  Knowledge Questionnaire Score - 06/19/22 1452       Knowledge Questionnaire Score   Pre Score 17/18             Goals reviewed with patient; copy given to patient.

## 2022-11-29 ENCOUNTER — Encounter: Payer: Self-pay | Admitting: *Deleted

## 2022-11-29 DIAGNOSIS — J449 Chronic obstructive pulmonary disease, unspecified: Secondary | ICD-10-CM

## 2022-11-29 NOTE — Progress Notes (Signed)
Discharge Summary   Darius Norris (DOB: 2051/09/29)   [name] graduated today from  rehab with 33 sessions completed.  Details of the patient's exercise prescription and what He needs to do in order to continue the prescription and progress were discussed with patient.  Patient was given a copy of prescription and goals.  Patient verbalized understanding.  [name] plans to continue to exercise by walking.   [place] Name 06/19/22 1443 11/23/22 1154       6 Minute Walk   Phase Initial Discharge    Distance 355 feet 415 feet    Distance % Change -- 16.9 %    Distance Feet Change -- 60 ft    Walk Time 3.03 minutes 4.77 minutes    # of Rest Breaks 2  25 sec, 17 sec, stopped at 3:44 5  1:35-1:43; 2:46-2:58;3:08-3:30; 4:32-4:48; 5:30-5:52    MPH 1.33 0.98    METS 0.96 1.07    RPE 15 13    Perceived Dyspnea  2 3    VO2 Peak 3.38 3.76    Symptoms Yes (comment) Yes (comment)    Comments hip pain 6/10, back pain 6/10, SOB, fatigue SOB    Resting HR 62 bpm 68 bpm    Resting BP 146/64 124/62    Resting Oxygen Saturation  93 % 93 %    Exercise Oxygen Saturation  during 6 min walk 80 % 85 %    Max Ex. HR 74 bpm 82 bpm    Max Ex. BP 156/64 154/64    2 Minute Post BP 126/64 128/62      Interval HR   1 Minute HR 69 76    2 Minute HR 73 72    3 Minute HR 74 78    4 Minute HR 74  stopped at 3:44 79    5 Minute HR -- 72    6 Minute HR -- 82    2 Minute Post HR 60 74    Interval Heart Rate? Yes Yes      Interval Oxygen   Interval Oxygen? Yes Yes    Baseline Oxygen Saturation % 93 %  2L pulsed 93 %    1 Minute Oxygen Saturation % 89 % 95 %    1 Minute Liters of Oxygen 2 L  pulsed 3 L  pulsed    2 Minute Oxygen Saturation % 83 % 89 %    2 Minute Liters of Oxygen 2 L 3 L    3 Minute Oxygen Saturation % 81 % 86 %    3 Minute Liters of Oxygen 3 L 3 L    4 Minute Oxygen Saturation % 80 % 85 %    4 Minute Liters of Oxygen 3 L 3 L    5 Minute Oxygen Saturation % -- 86 %    5 Minute  Liters of Oxygen -- 3 L    6 Minute Oxygen Saturation % -- 85 %    6 Minute Liters of Oxygen -- 3 L    2 Minute Post Oxygen Saturation % 92 % 89 %    2 Minute Post Liters of Oxygen 3 L 3 L

## 2022-11-29 NOTE — Progress Notes (Signed)
Pulmonary Individual Treatment Plan  Patient Details  Name: YUSUF YU MRN: [id number] Date of Birth: 01-05-1951 Referring Provider:   Flowsheet Row Pulmonary Rehab from 06/19/2022 in [facility] Cardiac and Pulmonary Rehab  Referring Provider Delorise Jackson MD  [Dr. Jenne Pane ([facility])]       Initial Encounter Date:  Flowsheet Row Pulmonary Rehab from 06/19/2022 in [facility] Cardiac and Pulmonary Rehab  Date 06/19/22       Visit Diagnosis: Chronic obstructive pulmonary disease, unspecified COPD type ([place])  Patient's Home Medications on Admission:  Current Outpatient Medications:    acetaminophen (TYLENOL) 325 MG tablet, Take 650 mg by mouth every 6 (six) hours as needed., Disp: , Rfl:    albuterol (VENTOLIN HFA) 108 (90 Base) MCG/ACT inhaler, Inhale into the lungs., Disp: , Rfl:    aspirin EC 81 MG EC tablet, Take 1 tablet (81 mg total) by mouth daily., Disp: 180 tablet, Rfl: 0   atorvastatin (LIPITOR) 80 MG tablet, Take 1 tablet (80 mg total) by mouth at bedtime., Disp: 30 tablet, Rfl: 0   budesonide-formoterol (SYMBICORT) 160-4.5 MCG/ACT inhaler, Inhale 2 puffs into the lungs 2 (two) times daily., Disp: , Rfl:    busPIRone (BUSPAR) 10 MG tablet, Take by mouth., Disp: , Rfl:    clobetasol (TEMOVATE) 0.05 % external solution, Apply topically., Disp: , Rfl:    dabigatran (PRADAXA) 150 MG CAPS capsule, Take 150 mg by mouth 2 (two) times daily., Disp: , Rfl:    diltiazem (CARDIZEM CD) 300 MG 24 hr capsule, Take 1 capsule (300 mg total) by mouth daily., Disp: 30 capsule, Rfl: 2   doxycycline (VIBRAMYCIN) 100 MG capsule, Take 1 capsule (100 mg total) by mouth 2 (two) times daily., Disp: 14 capsule, Rfl: 0   furosemide (LASIX) 40 MG tablet, Take 1 tablet (40 mg total) by mouth daily., Disp: 30 tablet, Rfl: 2   gabapentin (NEURONTIN) 300 MG capsule, Take 1 capsule by mouth 3 (three) times daily as needed., Disp: , Rfl:    guaifenesin (HUMIBID E) 400 MG TABS tablet, Take by mouth., Disp: ,  Rfl:    hydrocortisone 2.5 % cream, Apply topically., Disp: , Rfl:    hydrOXYzine (ATARAX/VISTARIL) 25 MG tablet, Take by mouth., Disp: , Rfl:    Ipratropium-Albuterol (COMBIVENT) 20-100 MCG/ACT AERS respimat, Inhale 1 puff into the lungs every 6 (six) hours as needed for wheezing or shortness of breath., Disp: , Rfl:    isosorbide mononitrate (IMDUR) 60 MG 24 hr tablet, Take 60 mg by mouth daily., Disp: , Rfl:    ketorolac (ACULAR) 0.5 % ophthalmic solution, INSTILL 1 DROP OPERATIVE EYE FOUR TIMES A DAY FOR POSTOPERATIVE PAIN/INFLAMMATION SEND TO 4B AMB SURGERY OR ON DAY OF SURGERY 04/05/22, Disp: , Rfl:    lidocaine (XYLOCAINE) 5 % ointment, Apply topically., Disp: , Rfl:    loratadine (CLARITIN) 10 MG tablet, Take 10 mg by mouth daily as needed for allergies. , Disp: , Rfl:    Melatonin 3 MG CAPS, Take 2 capsules by mouth at bedtime., Disp: , Rfl:    metFORMIN (GLUCOPHAGE-XR) 500 MG 24 hr tablet, Take 500 mg by mouth in the morning and at bedtime., Disp: , Rfl:    methotrexate (RHEUMATREX) 2.5 MG tablet, Take 10 mg by mouth once a week., Disp: , Rfl:    metoprolol tartrate (LOPRESSOR) 100 MG tablet, Take 1 tablet (100 mg total) by mouth 2 (two) times daily., Disp: 60 tablet, Rfl: 2   mometasone (ASMANEX) 220 MCG/INH inhaler, Take by mouth.,  Disp: , Rfl:    moxifloxacin (VIGAMOX) 0.5 % ophthalmic solution, INSTILL 1 DROP IN LEFT EYE FOUR TIMES A DAY FOR CATARACT SURGERY SEND TO 4B AMB SURGERY OR ON DAY OF SURGERY 04/05/22, Disp: , Rfl:    predniSONE (STERAPRED UNI-PAK 21 TAB) 10 MG (21) TBPK tablet, Per packaging instructions, Disp: 21 each, Rfl: 0   senna (SENOKOT) 8.6 MG TABS tablet, Take 1 tablet by mouth daily as needed for mild constipation., Disp: , Rfl:    simethicone (MYLICON) 80 MG chewable tablet, CHEW ONE TABLET BY MOUTH THREE TIMES A DAY AFTER MEALS AS NEEDED FOR GAS, Disp: , Rfl:   Past Medical History: Past Medical History:  Diagnosis Date   A-fib ([place])    CAD (coronary artery  disease)    Colitis    COPD (chronic obstructive pulmonary disease) (HCC)    Emphysema lung (HCC)    GERD (gastroesophageal reflux disease)    Hypertension    MI (myocardial infarction) ([place]) 1999    Tobacco Use: Social History   Tobacco Use  Smoking Status Former   Packs/day: 1.00   Years: 30.00   Total pack years: 30.00   Types: Cigarettes   Quit date: 12/18/2001   Years since quitting: 20.9  Smokeless Tobacco Never    Labs: Review Flowsheet  More data may exist      Latest Ref Rng & Units 02/04/2013 09/04/2016 05/02/2018 01/05/2019 12/06/2021  Labs for ITP Cardiac and Pulmonary Rehab  Cholestrol 0 - 200 mg/dL 185  116  - - 106   LDL (calc) 0 - 99 mg/dL 114  51  - - 51   HDL-C >40 mg/dL 38  42  - - 33   Trlycerides <150 mg/dL 166  116  - - 108   Hemoglobin A1c 4.8 - 5.6 % - 6.4  - - 5.9   Bicarbonate 20.0 - 28.0 mmol/L - - 36.3  29.6  -  O2 Saturation % - - - 81.7  -     Pulmonary Assessment Scores:  Pulmonary Assessment Scores     Row Name 06/19/22 1455 11/29/22 1427       ADL UCSD   ADL Phase Entry Exit    SOB Score total 49 58    Rest 0 1    Walk 2 3    Stairs 4 4    Bath 3 3    Dress 1 3    Shop 2 3      CAT Score   CAT Score 17 24      mMRC Score   mMRC Score 2 --             UCSD: Self-administered rating of dyspnea associated with activities of daily living (ADLs) 6-point scale (0 = "not at all" to 5 = "maximal or unable to do because of breathlessness")  Scoring Scores range from 0 to 120.  Minimally important difference is 5 units  CAT: CAT can identify the health impairment of COPD patients and is better correlated with disease progression.  CAT has a scoring range of zero to 40. The CAT score is classified into four groups of low (less than 10), medium (10 - 20), high (21-30) and very high (31-40) based on the impact level of disease on health status. A CAT score over 10 suggests significant symptoms.  A worsening CAT score could be  explained by an exacerbation, poor medication adherence, poor inhaler technique, or progression of COPD or comorbid conditions.  CAT  MCID is 2 points  mMRC: mMRC (Modified Medical Research Council) Dyspnea Scale is used to assess the degree of baseline functional disability in patients of respiratory disease due to dyspnea. No minimal important difference is established. A decrease in score of 1 point or greater is considered a positive change.   Pulmonary Function Assessment:  Pulmonary Function Assessment - 06/19/22 1455       Breath   Shortness of Breath Yes;Fear of Shortness of Breath;Limiting activity             Exercise Target Goals: Exercise Program Goal: Individual exercise prescription set using results from initial 6 min walk test and THRR while considering  patient's activity barriers and safety.   Exercise Prescription Goal: Initial exercise prescription builds to 30-45 minutes a day of aerobic activity, 2-3 days per week.  Home exercise guidelines will be given to patient during program as part of exercise prescription that the participant will acknowledge.  Education: Aerobic Exercise: - Group verbal and visual presentation on the components of exercise prescription. Introduces F.I.T.T principle from ACSM for exercise prescriptions.  Reviews F.I.T.T. principles of aerobic exercise including progression. Written material given at graduation. Flowsheet Row Pulmonary Rehab from 11/03/2021 in [facility] Cardiac and Pulmonary Rehab  Date 10/13/21  Educator [facility]  Instruction Review Code 1- Verbalizes Understanding       Education: Resistance Exercise: - Group verbal and visual presentation on the components of exercise prescription. Introduces F.I.T.T principle from ACSM for exercise prescriptions  Reviews F.I.T.T. principles of resistance exercise including progression. Written material given at graduation.    Education: Exercise & Equipment Safety: - Individual verbal  instruction and demonstration of equipment use and safety with use of the equipment. Flowsheet Row Pulmonary Rehab from 07/27/2022 in [facility] Cardiac and Pulmonary Rehab  Date 05/08/22  Educator [facility]  Instruction Review Code 1- Verbalizes Understanding       Education: Exercise Physiology & General Exercise Guidelines: - Group verbal and written instruction with models to review the exercise physiology of the cardiovascular system and associated critical values. Provides general exercise guidelines with specific guidelines to those with heart or lung disease.  Flowsheet Row Pulmonary Rehab from 11/03/2021 in [facility] Cardiac and Pulmonary Rehab  Date 08/04/21  Educator AS  Instruction Review Code 1- Verbalizes Understanding       Education: Flexibility, Balance, Mind/Body Relaxation: - Group verbal and visual presentation with interactive activity on the components of exercise prescription. Introduces F.I.T.T principle from ACSM for exercise prescriptions. Reviews F.I.T.T. principles of flexibility and balance exercise training including progression. Also discusses the mind body connection.  Reviews various relaxation techniques to help reduce and manage stress (i.e. Deep breathing, progressive muscle relaxation, and visualization). Balance handout provided to take home. Written material given at graduation. Flowsheet Row Pulmonary Rehab from 11/03/2021 in [facility] Cardiac and Pulmonary Rehab  Date 10/27/21  Educator AS  Instruction Review Code 1- Verbalizes Understanding       Activity Barriers & Risk Stratification:  Activity Barriers & Cardiac Risk Stratification - 06/19/22 1445       Activity Barriers & Cardiac Risk Stratification   Activity Barriers Shortness of Breath;Deconditioning;Back Problems;Muscular Weakness;Balance Concerns;Assistive Device;Other (comment);Joint Problems    Comments neuropathy in feet, back and hip pain             6 Minute Walk:  6 Minute Walk     Row  Name 06/19/22 1443 11/23/22 1154       6 Minute Walk   Phase Initial Discharge  Distance 355 feet 415 feet    Distance % Change -- 16.9 %    Distance Feet Change -- 60 ft    Walk Time 3.03 minutes 4.77 minutes    # of Rest Breaks 2  25 sec, 17 sec, stopped at 3:44 5  1:35-1:43; 2:46-2:58;3:08-3:30; 4:32-4:48; 5:30-5:52    MPH 1.33 0.98    METS 0.96 1.07    RPE 15 13    Perceived Dyspnea  2 3    VO2 Peak 3.38 3.76    Symptoms Yes (comment) Yes (comment)    Comments hip pain 6/10, back pain 6/10, SOB, fatigue SOB    Resting HR 62 bpm 68 bpm    Resting BP 146/64 124/62    Resting Oxygen Saturation  93 % 93 %    Exercise Oxygen Saturation  during 6 min walk 80 % 85 %    Max Ex. HR 74 bpm 82 bpm    Max Ex. BP 156/64 154/64    2 Minute Post BP 126/64 128/62      Interval HR   1 Minute HR 69 76    2 Minute HR 73 72    3 Minute HR 74 78    4 Minute HR 74  stopped at 3:44 79    5 Minute HR -- 72    6 Minute HR -- 82    2 Minute Post HR 60 74    Interval Heart Rate? Yes Yes      Interval Oxygen   Interval Oxygen? Yes Yes    Baseline Oxygen Saturation % 93 %  2L pulsed 93 %    1 Minute Oxygen Saturation % 89 % 95 %    1 Minute Liters of Oxygen 2 L  pulsed 3 L  pulsed    2 Minute Oxygen Saturation % 83 % 89 %    2 Minute Liters of Oxygen 2 L 3 L    3 Minute Oxygen Saturation % 81 % 86 %    3 Minute Liters of Oxygen 3 L 3 L    4 Minute Oxygen Saturation % 80 % 85 %    4 Minute Liters of Oxygen 3 L 3 L    5 Minute Oxygen Saturation % -- 86 %    5 Minute Liters of Oxygen -- 3 L    6 Minute Oxygen Saturation % -- 85 %    6 Minute Liters of Oxygen -- 3 L    2 Minute Post Oxygen Saturation % 92 % 89 %    2 Minute Post Liters of Oxygen 3 L 3 L            Oxygen Initial Assessment:  Oxygen Initial Assessment - 07/04/22 1129       Home Oxygen   Home Oxygen Device Home Concentrator;E-Tanks    Sleep Oxygen Prescription Continuous    Liters per minute 3    Home Exercise  Oxygen Prescription Pulsed    Liters per minute 3    Home Resting Oxygen Prescription Pulsed    Liters per minute 3    Compliance with Home Oxygen Use Yes      Program Oxygen Prescription   Program Oxygen Prescription Continuous;E-Tanks    Liters per minute 3      Intervention   Short Term Goals To learn and exhibit compliance with exercise, home and travel O2 prescription;To learn and understand importance of monitoring SPO2 with pulse oximeter and demonstrate accurate use of the pulse  oximeter.;To learn and understand importance of maintaining oxygen saturations>88%;To learn and demonstrate proper pursed lip breathing techniques or other breathing techniques. ;To learn and demonstrate proper use of respiratory medications    Long  Term Goals Exhibits compliance with exercise, home  and travel O2 prescription;Verbalizes importance of monitoring SPO2 with pulse oximeter and return demonstration;Maintenance of O2 saturations>88%;Exhibits proper breathing techniques, such as pursed lip breathing or other method taught during program session;Compliance with respiratory medication;Demonstrates proper use of MDI's             Oxygen Re-Evaluation:  Oxygen Re-Evaluation     Row Name 07/04/22 1129 08/01/22 1124 09/07/22 1128 10/03/22 1126 10/31/22 1307     Program Oxygen Prescription   Program Oxygen Prescription -- Continuous;E-Tanks Continuous;E-Tanks Continuous;E-Tanks Continuous;E-Tanks   Liters per minute -- _0 Home Oxygen   Home Oxygen Device -- Home Concentrator;E-Tanks Home Concentrator;E-Tanks Home Concentrator;E-Tanks Home Concentrator;E-Tanks   Sleep Oxygen Prescription -- Continuous Continuous Continuous Continuous   Liters per minute -- _1 Home Exercise Oxygen Prescription -- Continuous Continuous Continuous Continuous   Liters per minute -- _2 Home Resting Oxygen Prescription -- Continuous Continuous Continuous Continuous   Liters per minute -- _3 Compliance with Home Oxygen Use -- Yes Yes Yes Yes     Goals/Expected Outcomes   Short Term Goals -- To learn and exhibit compliance with exercise, home and travel O2 prescription;To learn and understand importance of monitoring SPO2 with pulse oximeter and demonstrate accurate use of the pulse oximeter.;To learn and understand importance of maintaining oxygen saturations>88%;To learn and demonstrate proper pursed lip breathing techniques or other breathing techniques. ;To learn and demonstrate proper use of respiratory medications To learn and exhibit compliance with exercise, home and travel O2 prescription;To learn and understand importance of monitoring SPO2 with pulse oximeter and demonstrate accurate use of the pulse oximeter.;To learn and understand importance of maintaining oxygen saturations>88%;To learn and demonstrate proper pursed lip breathing techniques or other breathing techniques. ;To learn and demonstrate proper use of respiratory medications To learn and exhibit compliance with exercise, home and travel O2 prescription;To learn and understand importance of monitoring SPO2 with pulse oximeter and demonstrate accurate use of the pulse oximeter.;To learn and understand importance of maintaining oxygen saturations>88%;To learn and demonstrate proper pursed lip breathing techniques or other breathing techniques. ;To learn and demonstrate proper use of respiratory medications To learn and exhibit compliance with exercise, home and travel O2 prescription;To learn and understand importance of monitoring SPO2 with pulse oximeter and demonstrate accurate use of the pulse oximeter.;To learn and understand importance of maintaining oxygen saturations>88%;To learn and demonstrate proper pursed lip breathing techniques or other breathing techniques. ;To learn and demonstrate proper use of respiratory medications   Long  Term Goals -- Exhibits compliance with exercise, home  and travel O2  prescription;Verbalizes importance of monitoring SPO2 with pulse oximeter and return demonstration;Maintenance of O2 saturations>88%;Exhibits proper breathing techniques, such as pursed lip breathing or other method taught during program session;Compliance with respiratory medication;Demonstrates proper use of MDI's Exhibits compliance with exercise, home  and travel O2 prescription;Verbalizes importance of monitoring SPO2 with pulse oximeter and return demonstration;Maintenance of O2 saturations>88%;Exhibits proper breathing techniques, such as pursed lip breathing or other method taught during program session;Compliance with respiratory medication;Demonstrates proper use of MDI's Exhibits compliance with exercise, home  and travel O2 prescription;Verbalizes importance of monitoring SPO2 with pulse oximeter  and return demonstration;Maintenance of O2 saturations>88%;Exhibits proper breathing techniques, such as pursed lip breathing or other method taught during program session;Compliance with respiratory medication;Demonstrates proper use of MDI's Exhibits compliance with exercise, home  and travel O2 prescription;Verbalizes importance of monitoring SPO2 with pulse oximeter and return demonstration;Maintenance of O2 saturations>88%;Exhibits proper breathing techniques, such as pursed lip breathing or other method taught during program session;Compliance with respiratory medication;Demonstrates proper use of MDI's   Comments [name] continues to use his oxygen as directed. He is recovering from double pneumonia and feels much better than before. He continues to watch his O2 and knows to notify his doctor of any abnormal changes. Retierated PLB. He is staying compliant with his medications and denies having any questions on how to use or take his respiratory medications. [name] is doing well with his oxygen therapy.  He had a visit from oxygen company today as they checked up on his equipment.  They said all was good.  He  continues to use his PLB and nose strips to help with air flow.  His saturations have continued to do well when he checks at home. [name] is doing well with his oxygen therapy. His breathing is not as good right now and chronic sinus issues have been getting worse recently; he has an appointment Monday and would like to ask them for a chest x-ray as he is afraid he has pneumonia again; he had it before in June. He continues to use PLB and nose strips to help with shortness of breath and he feels it is helpful. He rpeorts his oxygen has been doing well when he checks it at home. [name] is doing well with his oxygen therapy. He reports that he has had a lot of drainage which has been causing him a little trouble with his breathing. He is still on continuous 3L of oxygen for exercise and is doing well. He continues to use PLB and nose strips to help with shortness of breath and he feels it is helpful. He rpeorts his oxygen has been doing well when he checks it at home. [name] continues to work on his breathing .  He is doing well with his PLB and staying compliant with his oxygen.  He does use his nose strips to help.  He is good at monitoring his saturations.   Goals/Expected Outcomes Short: Continue to monitor O2 closely Long: Continue long-term compliance with PLB and symptom monitoring Short; Continue to use PLB to help with breathing Long: conitnued compliance Short: speak with doctor about increased shortness of breath Long: conitnued compliance Short: continue to check oxygen saturation at home. Long: conitnued compliance Short: continue to check oxygen saturation at home. Long: conitnued compliance            Oxygen Discharge (Final Oxygen Re-Evaluation):  Oxygen Re-Evaluation - 10/31/22 1307       Program Oxygen Prescription   Program Oxygen Prescription Continuous;E-Tanks    Liters per minute 3      Home Oxygen   Home Oxygen Device Home Concentrator;E-Tanks    Sleep Oxygen Prescription Continuous     Liters per minute 3    Home Exercise Oxygen Prescription Continuous    Liters per minute 3    Home Resting Oxygen Prescription Continuous    Liters per minute 3    Compliance with Home Oxygen Use Yes      Goals/Expected Outcomes   Short Term Goals To learn and exhibit compliance with exercise, home and travel O2 prescription;To learn  and understand importance of monitoring SPO2 with pulse oximeter and demonstrate accurate use of the pulse oximeter.;To learn and understand importance of maintaining oxygen saturations>88%;To learn and demonstrate proper pursed lip breathing techniques or other breathing techniques. ;To learn and demonstrate proper use of respiratory medications    Long  Term Goals Exhibits compliance with exercise, home  and travel O2 prescription;Verbalizes importance of monitoring SPO2 with pulse oximeter and return demonstration;Maintenance of O2 saturations>88%;Exhibits proper breathing techniques, such as pursed lip breathing or other method taught during program session;Compliance with respiratory medication;Demonstrates proper use of MDI's    Comments [name] continues to work on his breathing .  He is doing well with his PLB and staying compliant with his oxygen.  He does use his nose strips to help.  He is good at monitoring his saturations.    Goals/Expected Outcomes Short: continue to check oxygen saturation at home. Long: conitnued compliance             Initial Exercise Prescription:  Initial Exercise Prescription - 06/19/22 1400       Date of Initial Exercise RX and Referring Provider   Date 06/19/22    Referring Provider Delorise Jackson MD   Dr. Jenne Pane ([facility])     Oxygen   Oxygen Continuous    Liters 3    Maintain Oxygen Saturation 88% or higher      Recumbant Bike   Level 1    RPM 50    Watts 10    Minutes 15    METs 1.5      NuStep   Level 1    SPM 80    Minutes 15    METs 1.5      REL-XR   Level 1    Speed 50    Minutes  15    METs 1.5      T5 Nustep   Level 1    SPM 80    Minutes 15    METs 1.5      Biostep-RELP   Level 1    SPM 50    Minutes 15    METs 2      Track   Laps 5    Minutes 15    METs 1.27      Prescription Details   Frequency (times per week) 2    Duration Progress to 30 minutes of continuous aerobic without signs/symptoms of physical distress      Intensity   THRR 40-80% of Max Heartrate 97-132    Ratings of Perceived Exertion 11-13    Perceived Dyspnea 0-4      Progression   Progression Continue to progress workloads to maintain intensity without signs/symptoms of physical distress.      Resistance Training   Training Prescription Yes    Weight 3 kb    Reps 10-15             Perform Capillary Blood Glucose checks as needed.  Exercise Prescription Changes:   Exercise Prescription Changes     Row Name 06/19/22 1400 06/28/22 1200 07/12/22 1300 07/18/22 1400 07/27/22 1500     Response to Exercise   Blood Pressure (Admit) 146/64 116/64 128/64 -- 114/64   Blood Pressure (Exercise) 156/64 138/60 134/82 -- 132/82   Blood Pressure (Exit) 128/60 128/52 116/64 -- 122/64   Heart Rate (Admit) 62 bpm 62 bpm 67 bpm -- 56 bpm   Heart Rate (Exercise) 74 bpm 64 bpm 68 bpm -- 81 bpm  Heart Rate (Exit) 60 bpm 61 bpm 62 bpm -- 63 bpm   Oxygen Saturation (Admit) 93 % 95 % 90 % -- 94 %   Oxygen Saturation (Exercise) 80 % 96 % 96 % -- 88 %   Oxygen Saturation (Exit) 96 % 94 % 99 % -- 96 %   Rating of Perceived Exertion (Exercise) _0 -- 13   Perceived Dyspnea (Exercise) 2 -- 2 -- 1   Symptoms hip and back pain 6/10, SOB SOB SOB -- SOB   Comments walk test results second full day of exercise -- -- --   Duration -- Progress to 30 minutes of  aerobic without signs/symptoms of physical distress Continue with 30 min of aerobic exercise without signs/symptoms of physical distress. -- Continue with 30 min of aerobic exercise without signs/symptoms of physical distress.    Intensity -- THRR unchanged THRR unchanged -- THRR unchanged     Progression   Progression -- Continue to progress workloads to maintain intensity without signs/symptoms of physical distress. Continue to progress workloads to maintain intensity without signs/symptoms of physical distress. -- Continue to progress workloads to maintain intensity without signs/symptoms of physical distress.   Average METs -- 1.95 2 -- 2.06     Resistance Training   Training Prescription -- Yes Yes -- Yes   Weight -- 4 lb 4 lb -- 4 lb   Reps -- 10-15 10-15 -- 10-15     Interval Training   Interval Training -- No No -- No     Oxygen   Oxygen -- Continuous Continuous -- Continuous   Liters -- 3 3 -- 3     Treadmill   MPH -- 1 1.2 -- 1.4   Grade -- 0 0 -- 0   Minutes -- 15 15 -- 15   METs -- 1.77 1.92 -- 2.07     Recumbant Bike   Level -- -- 2 -- 2   Minutes -- -- 15 -- 15   METs -- -- -- -- 2.37     NuStep   Level -- 4 -- -- --   Minutes -- 15 -- -- --   METs -- 2.1 -- -- --     REL-XR   Level -- 1 -- -- 3   Minutes -- 15 -- -- 15   METs -- -- -- -- 2.2     Biostep-RELP   Level -- 1 -- -- --   Minutes -- 15 -- -- --   METs -- 2 -- -- --     Home Exercise Plan   Plans to continue exercise at -- -- -- Home (comment)  Treadmill for Walking; Hand Weights Home (comment)  Treadmill for Walking; Hand Weights   Frequency -- -- -- Add 3 additional days to program exercise sessions. Add 3 additional days to program exercise sessions.   Initial Home Exercises Provided -- -- -- 07/18/22 07/18/22     Oxygen   Maintain Oxygen Saturation -- 88% or higher 88% or higher 88% or higher 88% or higher    Row Name 09/07/22 1500 09/19/22 1400 10/17/22 1400 11/01/22 1100 11/16/22 1400     Response to Exercise   Blood Pressure (Admit) 118/64 128/60 98/58 132/66 126/72   Blood Pressure (Exit) 124/62 118/58 112/58 128/70 126/64   Heart Rate (Admit) 60 bpm 74 bpm 60 bpm 70 bpm 73 bpm   Heart Rate (Exercise)  70 bpm 82 bpm 79 bpm 76 bpm 93 bpm   Heart  Rate (Exit) 61 bpm 64 bpm 59 bpm 68 bpm 54 bpm   Oxygen Saturation (Admit) 95 % 91 % 97 % 97 % 97 %   Oxygen Saturation (Exercise) 94 % 89 % 95 % 95 % 94 %   Oxygen Saturation (Exit) 98 % 98 % 98 % 98 % 98 %   Rating of Perceived Exertion (Exercise) _0 Perceived Dyspnea (Exercise) _1 Symptoms _2    Duration Continue with 30 min of aerobic exercise without signs/symptoms of physical distress. Continue with 30 min of aerobic exercise without signs/symptoms of physical distress. Continue with 30 min of aerobic exercise without signs/symptoms of physical distress. Continue with 30 min of aerobic exercise without signs/symptoms of physical distress. Continue with 30 min of aerobic exercise without signs/symptoms of physical distress.   Intensity _3      Progression   Progression Continue to progress workloads to maintain intensity without signs/symptoms of physical distress. Continue to progress workloads to maintain intensity without signs/symptoms of physical distress. Continue to progress workloads to maintain intensity without signs/symptoms of physical distress. Continue to progress workloads to maintain intensity without signs/symptoms of physical distress. Continue to progress workloads to maintain intensity without signs/symptoms of physical distress.   Average METs 2.34 2.53 2.73 2.28 1.93     Resistance Training   Training Prescription _4    Weight 4 lb 4 lb 4 lb 4 lb 4 lb   Reps 10-15 10-15 10-15 10-15 10-15     Interval Training   Interval Training _5      Oxygen   Oxygen _6    Liters _7 2-3     Treadmill   MPH 1.3 1.8 1.8 1.2 1.2   Grade 0 0 0 0.5 0.5   Minutes _8 METs 1.99 2.38 2.38 2 2     Recumbant Bike   Level -- 3 3 -- --    [place] -- -- 20 -- --   Minutes -- 15 15 -- --   METs -- 2.66 -- -- --     NuStep   Level -- -- 3 2 --   Minutes -- -- 15 15 --   METs -- -- 2.2 2.3 --     REL-XR   Level _9 Minutes _10 METs 2.7 2.7 5.8 -- --     T5 Nustep   Level -- -- -- -- 4   Minutes -- -- -- -- 15   METs -- -- -- -- 1.8     Home Exercise Plan   Plans to continue exercise at Home (comment)  Treadmill for Walking; Hand Weights Home (comment)  Treadmill for Walking; Hand Weights Home (comment)  Treadmill for Walking; Hand Weights Home (comment)  Treadmill for Walking; Hand Weights Home (comment)  Treadmill for Walking; Hand Weights   Frequency Add 3 additional days to program exercise sessions. Add 3 additional days to program exercise sessions. Add 3 additional days to program exercise sessions. Add 3 additional days to program exercise sessions. Add 3 additional days to program exercise sessions.   Initial Home Exercises Provided 07/18/22 07/18/22 07/18/22 07/18/22 07/18/22     Oxygen   Maintain Oxygen Saturation 88% or higher 88% or higher  88% or higher 88% or higher 88% or higher    Row Name 11/27/22 1600             Response to Exercise   Blood Pressure (Admit) 124/62       Blood Pressure (Exit) 124/66       Heart Rate (Admit) 68 bpm       Heart Rate (Exercise) 71 bpm       Heart Rate (Exit) 62 bpm       Oxygen Saturation (Admit) 93 %       Oxygen Saturation (Exercise) 92 %       Oxygen Saturation (Exit) 98 %       Rating of Perceived Exertion (Exercise) 12       Perceived Dyspnea (Exercise) 2       Symptoms SOB       Duration Continue with 30 min of aerobic exercise without signs/symptoms of physical distress.       Intensity THRR unchanged         Progression   Progression Continue to progress workloads to maintain intensity without signs/symptoms of physical distress.       Average METs 2.05         Resistance Training   Training Prescription Yes       Weight 4 lb        Reps 10-15         Interval Training   Interval Training No         Oxygen   Oxygen Continuous       Liters 2-4         Treadmill   MPH 1.2       Grade 0.5       Minutes 15       METs 2         NuStep   Level 2       Minutes 15       METs 2.2         Home Exercise Plan   Plans to continue exercise at Home (comment)  Treadmill for Walking; Hand Weights       Frequency Add 3 additional days to program exercise sessions.       Initial Home Exercises Provided 07/18/22         Oxygen   Maintain Oxygen Saturation 88% or higher                Exercise Comments:   Exercise Comments     Row Name 06/22/22 1046           Exercise Comments First full day of exercise!  Patient was oriented to gym and equipment including functions, settings, policies, and procedures.  Patient's individual exercise prescription and treatment plan were reviewed.  All starting workloads were established based on the results of the 6 minute walk test done at initial orientation visit.  The plan for exercise progression was also introduced and progression will be customized based on patient's performance and goals.                Exercise Goals and Review:   Exercise Goals     Row Name 06/19/22 1449             Exercise Goals   Increase Physical Activity Yes       Intervention Provide advice, education, support and counseling about physical activity/exercise needs.;Develop an individualized exercise prescription for aerobic and resistive training based on initial evaluation  findings, risk stratification, comorbidities and participant's personal goals.       Expected Outcomes Short Term: Attend rehab on a regular basis to increase amount of physical activity.;Long Term: Add in home exercise to make exercise part of routine and to increase amount of physical activity.;Long Term: Exercising regularly at least 3-5 days a week.       Increase Strength and Stamina Yes       Intervention  Provide advice, education, support and counseling about physical activity/exercise needs.;Develop an individualized exercise prescription for aerobic and resistive training based on initial evaluation findings, risk stratification, comorbidities and participant's personal goals.       Expected Outcomes Short Term: Increase workloads from initial exercise prescription for resistance, speed, and METs.;Short Term: Perform resistance training exercises routinely during rehab and add in resistance training at home;Long Term: Improve cardiorespiratory fitness, muscular endurance and strength as measured by increased METs and functional capacity (6MWT)       Able to understand and use rate of perceived exertion (RPE) scale Yes       Intervention Provide education and explanation on how to use RPE scale       Expected Outcomes Short Term: Able to use RPE daily in rehab to express subjective intensity level;Long Term:  Able to use RPE to guide intensity level when exercising independently       Able to understand and use Dyspnea scale Yes       Intervention Provide education and explanation on how to use Dyspnea scale       Expected Outcomes Short Term: Able to use Dyspnea scale daily in rehab to express subjective sense of shortness of breath during exertion;Long Term: Able to use Dyspnea scale to guide intensity level when exercising independently       Knowledge and understanding of Target Heart Rate Range (THRR) Yes       Intervention Provide education and explanation of THRR including how the numbers were predicted and where they are located for reference       Expected Outcomes Short Term: Able to state/look up THRR;Short Term: Able to use daily as guideline for intensity in rehab;Long Term: Able to use THRR to govern intensity when exercising independently       Able to check pulse independently Yes       Intervention Provide education and demonstration on how to check pulse in carotid and radial  arteries.;Review the importance of being able to check your own pulse for safety during independent exercise       Expected Outcomes Short Term: Able to explain why pulse checking is important during independent exercise;Long Term: Able to check pulse independently and accurately       Understanding of Exercise Prescription Yes       Intervention Provide education, explanation, and written materials on patient's individual exercise prescription       Expected Outcomes Short Term: Able to explain program exercise prescription;Long Term: Able to explain home exercise prescription to exercise independently                Exercise Goals Re-Evaluation :  Exercise Goals Re-Evaluation     Row Name 06/22/22 1046 06/28/22 1241 07/04/22 1124 07/12/22 1322 07/27/22 1505     Exercise Goal Re-Evaluation   Exercise Goals Review -- Increase Physical Activity;Increase Strength and Stamina;Understanding of Exercise Prescription Increase Physical Activity;Increase Strength and Stamina;Understanding of Exercise Prescription Increase Physical Activity;Increase Strength and Stamina;Understanding of Exercise Prescription Increase Physical Activity;Increase Strength and Stamina;Understanding of  Exercise Prescription   Comments Reviewed RPE and dyspnea scales, THR and program prescription with pt today.  Pt voiced understanding and was given a copy of goals to take home. [name] has completed his first two full days of exercise again.  He was able to walk some.  We will conitnue to monitor his progress. [name] just started rehab not too long ago and has not done any home exercise yet. He has a treadmill at home that he plans to use. EP will go over home exercise with patient soon once he is further into the program. 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 We did review key points on home exercise: reviewing THR, monitoring HR & O2, PLB, monitoring intensity. [name] is off to a good start in rehab.  He is up to 1.2 mph on  the treadmill and prefers treadmill to track.  He is also using 4 lb weights.  We will continue to montior his progress. [name] is doing well in rehab. He recently improved his overall average MET level to 2.06 METs. He also improved his speed on the treadmill to 1.4 mph. He has done well with 4 lb hand weights for resistance training as well. We will continue to monitor his progress in the program.   Expected Outcomes Short: Use RPE daily to regulate intensity. Long: Follow program prescription in THR. Short: Continue to monitor rehab regularly Long: Continue to follow program prescription Short: EP to go over home exercise Long: Exercise independently at home at appropriate prescription Short: Continue to increase workload on treadmill, try some incline Long: conitnue to improve stamina Short: Attempt to add incline on the treadmill while maintaining speed. Long: conitnue to improve strength and stamina.    [place] Name 08/01/22 1116 09/07/22 1128 09/19/22 1409 10/02/22 1314 10/03/22 1115     Exercise Goal Re-Evaluation   Exercise Goals Review Increase Physical Activity;Increase Strength and Stamina;Understanding of Exercise Prescription Increase Physical Activity;Increase Strength and Stamina;Understanding of Exercise Prescription Increase Physical Activity;Increase Strength and Stamina;Understanding of Exercise Prescription Increase Physical Activity;Increase Strength and Stamina;Understanding of Exercise Prescription Increase Physical Activity;Increase Strength and Stamina;Understanding of Exercise Prescription   Comments [name] is doing well in rehab.  He is moving around the house more, but not using his treadmill.  He knows he needs to and will try to get back to it.  He does feel like his stamina is starting to improve.  He is still limited in his activity with his tailbone and hip. [name] is not exercising much at home right now, he tries to stay active around the house, but he is not using his treadmill. He  is interested in the [facility] - gave him information. [name] continues to do well in rehab. He recently increased his overall average MET level to 2.53 METs. He also increased his workload on the treadmill to a speed of 1.8 mph and no incline. He also improved to level 3 on the recumebnt bike. We will continue to monitor his progress. [name] has not been here since last review. He had a bad reaction to a shot and has not been here since 9/28. Staff has attempted to call patient for updates and will continue to do so. Today was Mike's first day back at rehab since 9/28. He was out due to a bad reaction to a shot. He states that he was able to get on the treadmill a little at home during his time away from rehab. He is having some foot swelling that has  bothered him, but has an appointment to get that cheched out. We will continue to monitor his progress as he gets back into his regular attendance at rehab.   Expected Outcomes Short: Get back on treadmill again Long: Continue to improve stamina Short: Get back on treadmill again, check out [facility] Long: Continue to improve stamina Short: Continue to increase workloads and work towards more walking. Long: Continue to increase strength and stamina. Short: Return to rehab when ready Long: Continue to build up overall strength and stmina Short: Return to regular attendance in rehab. Long: Continue to build up overall strength and stamina.    [place] Name 10/17/22 1427 10/31/22 1212 10/31/22 1242 11/01/22 1208 11/16/22 1422     Exercise Goal Re-Evaluation   Exercise Goals Review Increase Physical Activity;Increase Strength and Stamina;Understanding of Exercise Prescription Increase Physical Activity;Increase Strength and Stamina;Understanding of Exercise Prescription Increase Physical Activity;Increase Strength and Stamina;Understanding of Exercise Prescription Increase Physical Activity;Increase Strength and Stamina;Understanding of Exercise Prescription Increase Physical  Activity;Increase Strength and Stamina;Understanding of Exercise Prescription   Comments [name] is doing well in rehab since returning, after being out due to a bad reaction to a shot. He increased his overall average MET level to 2.73 METs. He also has been walking on the treadmill at a speed of 1.8 mph with no incline. He has done well on the XR and T4 at level 3 as well. We will continue to monitor his progress in the program. -- [name] is doing well in rehab.  He is still using his treadmill at home for exercise 2x a week for 15-20 min.  He is feeling better overall.  He is due for his post walk soon and we expect to see an improvement in his distance. [name] is getting close to 30 sessions which means he will be due for his post 6MWT. He dropped his speed on the treadmill due to SOB and we hope to see that go up overtime.  His oxygen saturations are staying well above 88%, but not quite hitting his THR. He is doing well with level 3 on the XR. We will continue to monitor. [name] is doing well in rehab. He recently improved his level on the T5 to level 4. He also saw some improvements with his SOB as his dyspnea stayed between a 0-2 since the last review. He is due for his post 6MWT and will look to improve his distance. We will conitnue to monitor his progress in the program.   Expected Outcomes Short: Continue to increase workloads as tolerated. Long: Continue to build up overall strength and stamina. -- Short: Improve post walk test Long: Conitnue to improve stamina Short: Improve on post 6MWT Long: Contine to increase overall MET level Short: Improve on post 6MWT. Long: Contine to increase strength and stamina.    [place] Name 11/27/22 1617             Exercise Goal Re-Evaluation   Exercise Goals Review Increase Physical Activity;Increase Strength and Stamina;Understanding of Exercise Prescription       Comments [name] is doing well in rehab. He improved his post 6MWT by 60 ft.  He was also able to walk for a  longer period of time too.  He is set to continue to exercise by walking on his treadmill at home.       Expected Outcomes Short: graduate Long: Continue to exercise independently                Discharge Exercise  Prescription (Final Exercise Prescription Changes):  Exercise Prescription Changes - 11/27/22 1600       Response to Exercise   Blood Pressure (Admit) 124/62    Blood Pressure (Exit) 124/66    Heart Rate (Admit) 68 bpm    Heart Rate (Exercise) 71 bpm    Heart Rate (Exit) 62 bpm    Oxygen Saturation (Admit) 93 %    Oxygen Saturation (Exercise) 92 %    Oxygen Saturation (Exit) 98 %    Rating of Perceived Exertion (Exercise) 12    Perceived Dyspnea (Exercise) 2    Symptoms SOB    Duration Continue with 30 min of aerobic exercise without signs/symptoms of physical distress.    Intensity THRR unchanged      Progression   Progression Continue to progress workloads to maintain intensity without signs/symptoms of physical distress.    Average METs 2.05      Resistance Training   Training Prescription Yes    Weight 4 lb    Reps 10-15      Interval Training   Interval Training No      Oxygen   Oxygen Continuous    Liters 2-4      Treadmill   MPH 1.2    Grade 0.5    Minutes 15    METs 2      NuStep   Level 2    Minutes 15    METs 2.2      Home Exercise Plan   Plans to continue exercise at Home (comment)   Treadmill for Walking; Hand Weights   Frequency Add 3 additional days to program exercise sessions.    Initial Home Exercises Provided 07/18/22      Oxygen   Maintain Oxygen Saturation 88% or higher             Nutrition:  Target Goals: Understanding of nutrition guidelines, daily intake of sodium <1521m, cholesterol <2012m calories 30% from fat and 7% or less from saturated fats, daily to have 5 or more servings of fruits and vegetables.  Education: All About Nutrition: -Group instruction provided by verbal, written material, interactive  activities, discussions, models, and posters to present general guidelines for heart healthy nutrition including fat, fiber, MyPlate, the role of sodium in heart healthy nutrition, utilization of the nutrition label, and utilization of this knowledge for meal planning. Follow up email sent as well. Written material given at graduation. [facility]  Education need identified 06/19/22       Biometrics:  Pre Biometrics - 06/19/22 1451       Pre Biometrics   Height 5' 5.6" (1.666 m)    Weight 184 lb 4.8 oz (83.6 kg)    BMI (Calculated) 30.12    Single Leg Stand 0 seconds             Post Biometrics - 11/23/22 1154        Post  Biometrics   Height 5' 5.6" (1.666 m)    Weight 192 lb 11.2 oz (87.4 kg)    BMI (Calculated) 31.49    Single Leg Stand 0 seconds             Nutrition Therapy Plan and Nutrition Goals:  Nutrition Therapy & Goals - 09/07/22 1126       Nutrition Therapy   RD appointment deferred Yes      Personal Nutrition Goals   Nutrition Goal MiRonalee Beltsould not like to  speak with RD at this time; will continue to follow up.             Nutrition Assessments:  MEDIFICTS Score Key: ?70 Need to make dietary changes  40-70 Heart Healthy Diet ? 40 Therapeutic Level Cholesterol Diet  Flowsheet Row Documentation from 11/29/2022 in [facility]  Picture Your Plate Total Score on Discharge 41      Picture Your Plate Scores: <03 Unhealthy dietary pattern with much room for improvement. 41-50 Dietary pattern unlikely to meet recommendations for good health and room for improvement. 51-60 More healthful dietary pattern, with some room for improvement.  >60 Healthy dietary pattern, although there may be some specific behaviors that could be improved.   Nutrition Goals Re-Evaluation:  Nutrition Goals Re-Evaluation     Row Name 07/04/22 1159 08/01/22 1120 10/03/22 1118  10/31/22 1257       Goals   Nutrition Goal -- [name] would not like to speak with RD at this time; will continue to follow up. [name] would not like to speak with RD at this time; will continue to follow up. Short: Continue to work on portion control Long: continue to add in more vegetables    Comment Patient has yet to meet with the RD at this time. He is not sure if he would like to make an appointment, [name] is doesn't want to meet with RD.  He is sticking to lean proteins.  He likes beans and potatoes.  His favorite is catelope especially during this time of the year.  He enjoys fruits like grapes too throughout the year.  He continues to work on portion control [name] is doesn't want to meet with RD.  He is sticking to lean proteins.  He likes beans and potatoes.  His favorite is catelope especially during this time of the year.  He enjoys fruits like grapes too throughout the year.  He continues to work on portion control [name] is doing better with his diet.  His wife is now working on hers too and it makes it easier for both of them to eat right.  They are watching sodium and sugar closely.  [name] continues to work on portion control too.  They are also working on adding in more variety.    Expected Outcome Short: Schedule appt with RD if interested Long: Follow healthy pulmonary based diet Short: Continue to work on portion control Long: continue to add in more vegetables Short: Continue to work on portion control Long: continue to add in more vegetables Short: Reduce sugar and salt Long: Conitnue to add in more variety             Nutrition Goals Discharge (Final Nutrition Goals Re-Evaluation):  Nutrition Goals Re-Evaluation - 10/31/22 1257       Goals   Nutrition Goal Short: Continue to work on portion control Long: continue to add in more vegetables    Comment [name] is doing better with his diet.  His wife is now working on hers too and it makes it easier for both of them to eat right.  They are  watching sodium and sugar closely.  [name] continues to work on portion control too.  They are also working on adding in more variety.    Expected Outcome Short: Reduce sugar and salt Long: Conitnue to add in more variety             Psychosocial: Target Goals: Acknowledge presence or absence of significant depression and/or  stress, maximize coping skills, provide positive support system. Participant is able to verbalize types and ability to use techniques and skills needed for reducing stress and depression.   Education: Stress, Anxiety, and Depression - Group verbal and visual presentation to define topics covered.  Reviews how body is impacted by stress, anxiety, and depression.  Also discusses healthy ways to reduce stress and to treat/manage anxiety and depression.  Written material given at graduation. Flowsheet Row Pulmonary Rehab from 11/03/2021 in [facility]  Date 07/28/21  Educator [facility]  Instruction Review Code 1- [employer] Understanding       Education: Sleep Hygiene -Provides group verbal and written instruction about how sleep can affect your health.  Define sleep hygiene, discuss sleep cycles and impact of sleep habits. Review good sleep hygiene tips.    Initial Review & Psychosocial Screening:   Quality of Life Scores:  Scores of 19 and below usually indicate a poorer quality of life in these areas.  A difference of  2-3 points is a clinically meaningful difference.  A difference of 2-3 points in the total score of the Quality of Life Index has been associated with significant improvement in overall quality of life, self-image, physical symptoms, and general health in studies assessing change in quality of life.  PHQ-9: Review Flowsheet  More data exists      11/29/2022 07/18/2022 06/19/2022 11/17/2021 06/13/2021  Depression screen PHQ 2/9  Decreased Interest _0 0  Down, Depressed, Hopeless 0 0 1 0 0  PHQ - 2 Score _1 0  Altered sleeping  _2 0  Tired, decreased energy _3 Change in appetite 0 0 0 1 0  Feeling bad or failure about yourself  1 0 0 0 0  Trouble concentrating 0 0 0 0 0  Moving slowly or fidgety/restless 0 0 0 0 0  Suicidal thoughts 0 0 0 0 0  PHQ-9 Score _4 Difficult doing work/chores Somewhat difficult Somewhat difficult Not difficult at all Somewhat difficult -   Interpretation of Total Score  Total Score Depression Severity:  1-4 = Minimal depression, 5-9 = Mild depression, 10-14 = Moderate depression, 15-19 = Moderately severe depression, 20-27 = Severe depression   Psychosocial Evaluation and Intervention:   Psychosocial Re-Evaluation:  Psychosocial Re-Evaluation     Row Name 07/04/22 1131 08/01/22 1117 09/07/22 1137 10/03/22 1119 10/31/22 1245     Psychosocial Re-Evaluation   Current issues with Current Sleep Concerns;Current Stress Concerns Current Sleep Concerns;Current Stress Concerns Current Sleep Concerns;Current Stress Concerns Current Sleep Concerns;Current Stress Concerns Current Sleep Concerns;Current Stress Concerns   Comments [name] is just getting starting in rehab. He states his sleep is not great as he has trouble falling asleep, however, it has always been like this for a while. He takes melatonin to help but not too interested in taking something else. We talked about trying guided meditations and relaxation before bed. Discussed calm. com that has variation of meditation and deep breathing. He has some stress, but feels he tolerates it well. He is staying compliant with his medications and feels they are helping. [name] is doing well in rehab. His hip and tailbone are still limiting his activity.  He bought a new mattress 3 months ago and he is ready to get rid of it as he is hurting more and his sheets don't fit.  He is upset that he spent so much  money and it not working for him.  He has slats to support it as well as a box spring.  He is going to try to get rid of those.   His breathing is also limiting him in how much he is able to do.  He tries to use breathing to help focus and release stress. [name] reports he is still having issues sleeping and his wife has been snoring which has been bothering him; he reports not usually being able to sleep well. [name] reports no major stress concerns, but his cat passed away last week - he continues to maintain a positive attitude. He talks to his wife, but doesn't feel like he has a good support system to rely on. He used to enjoy fishing, but he had to give that up with his continued shortness of breath. [name] enjoys coming to rehab and feels it gives him some social interaction. [name] reports some stress in his life with everything going on but continues to maintain a positive outlook. He states that he is still having trouble sleeping, due to him having to get up every few hours to use the restroom. He also reports having a good support system at home made up primarily by his wife. [name] is doing well in rehab.  He is sleeping a little bit better now.  He and his wife are now sleeping in separate rooms and it has helped his sleeping.  He is also happier now that she is starting to take better care of herself.  They are now working together on their diet and being more supportive.   Expected Outcomes Short: Work on different relaxation method before bed Long: Maintain posititve attitude Short: Try working on mattress Long: continue to focus on positives Short: continue to attend rehab for mental health boost Long: continue to focus on positives Short: continue to attend rehab for mental health boost. Long: continue to maintain positive outlook. Short: Conitnue to work with wife  Long: Continue to sleep better   Interventions Encouraged to attend Pulmonary Rehabilitation for the exercise -- Encouraged to attend Pulmonary Rehabilitation for the exercise Encouraged to attend Pulmonary Rehabilitation for the exercise Encouraged to attend Pulmonary  Rehabilitation for the exercise   Continue Psychosocial Services  Follow up required by staff -- Follow up required by staff Follow up required by staff Follow up required by staff            Psychosocial Discharge (Final Psychosocial Re-Evaluation):  Psychosocial Re-Evaluation - 10/31/22 1245       Psychosocial Re-Evaluation   Current issues with Current Sleep Concerns;Current Stress Concerns    Comments [name] is doing well in rehab.  He is sleeping a little bit better now.  He and his wife are now sleeping in separate rooms and it has helped his sleeping.  He is also happier now that she is starting to take better care of herself.  They are now working together on their diet and being more supportive.    Expected Outcomes Short: Conitnue to work with wife  Long: Continue to sleep better    Interventions Encouraged to attend Pulmonary Rehabilitation for the exercise    Continue Psychosocial Services  Follow up required by staff             Education: Education Goals: Education classes will be provided on a weekly basis, covering required topics. Participant will state understanding/return demonstration of topics presented.  Learning Barriers/Preferences:   General Pulmonary Education Topics:  Infection  Prevention: - Provides verbal and written material to individual with discussion of infection control including proper hand washing and proper equipment cleaning during exercise session. Flowsheet Row Pulmonary Rehab from 07/27/2022 in [facility] Cardiac and Pulmonary Rehab  Date 05/08/22  Educator [facility]  Instruction Review Code 1- Verbalizes Understanding       Falls Prevention: - Provides verbal and written material to individual with discussion of falls prevention and safety. Flowsheet Row Pulmonary Rehab from 07/27/2022 in [facility] Cardiac and Pulmonary Rehab  Date 05/08/22  Educator [facility]  Instruction Review Code 1- Verbalizes Understanding       Chronic Lung Disease Review: -  Group verbal instruction with posters, models, PowerPoint presentations and videos,  to review new updates, new respiratory medications, new advancements in procedures and treatments. Providing information on websites and "800" numbers for continued self-education. Includes information about supplement oxygen, available portable oxygen systems, continuous and intermittent flow rates, oxygen safety, concentrators, and Medicare reimbursement for oxygen. Explanation of Pulmonary Drugs, including class, frequency, complications, importance of spacers, rinsing mouth after steroid MDI's, and proper cleaning methods for nebulizers. Review of basic lung anatomy and physiology related to function, structure, and complications of lung disease. Review of risk factors. Discussion about methods for diagnosing sleep apnea and types of masks and machines for OSA. Includes a review of the use of types of environmental controls: home humidity, furnaces, filters, dust mite/pet prevention, HEPA vacuums. Discussion about weather changes, air quality and the benefits of nasal washing. Instruction on Warning signs, infection symptoms, calling MD promptly, preventive modes, and value of vaccinations. Review of effective airway clearance, coughing and/or vibration techniques. Emphasizing that all should Create an Action Plan. Written material given at graduation. Flowsheet Row Pulmonary Rehab from 11/03/2021 in [facility] Cardiac and Pulmonary Rehab  Date 09/22/21  Educator [facility]  Instruction Review Code 1- Verbalizes Understanding       AED/CPR: - Group verbal and written instruction with the use of models to demonstrate the basic use of the AED with the basic ABC's of resuscitation. Flowsheet Row Pulmonary Rehab from 02/26/2019 in [facility] Cardiac and Pulmonary Rehab  Date 01/01/19  Educator [facility]  Instruction Review Code 1- Verbalizes Understanding        Anatomy and Cardiac Procedures: - Group verbal and visual presentation and  models provide information about basic cardiac anatomy and function. Reviews the testing methods done to diagnose heart disease and the outcomes of the test results. Describes the treatment choices: Medical Management, Angioplasty, or Coronary Bypass Surgery for treating various heart conditions including Myocardial Infarction, Angina, Valve Disease, and Cardiac Arrhythmias.  Written material given at graduation.   Medication Safety: - Group verbal and visual instruction to review commonly prescribed medications for heart and lung disease. Reviews the medication, class of the drug, and side effects. Includes the steps to properly store meds and maintain the prescription regimen.  Written material given at graduation. Flowsheet Row Pulmonary Rehab from 11/03/2021 in [facility] Cardiac and Pulmonary Rehab  Date 09/01/21  Educator [facility]  Instruction Review Code 1- Verbalizes Understanding       Other: -Provides group and verbal instruction on various topics (see comments)   Knowledge Questionnaire Score:  Knowledge Questionnaire Score - 11/29/22 1425       Knowledge Questionnaire Score   Post Score 18/18              Core Components/Risk Factors/Patient Goals at Admission:  Personal Goals and Risk Factors at Admission - 06/19/22 1452  Core Components/Risk Factors/Patient Goals on Admission    Weight Management Yes;Weight Loss;Obesity    Intervention Weight Management: Develop a combined nutrition and exercise program designed to reach desired caloric intake, while maintaining appropriate intake of nutrient and fiber, sodium and fats, and appropriate energy expenditure required for the weight goal.;Weight Management: Provide education and appropriate resources to help participant work on and attain dietary goals.;Weight Management/Obesity: Establish reasonable short term and long term weight goals.;Obesity: Provide education and appropriate resources to help participant work on and attain  dietary goals.    Admit Weight 184 lb 4.8 oz (83.6 kg)    Goal Weight: Short Term 180 lb (81.6 kg)    Goal Weight: Long Term 180 lb (81.6 kg)    Expected Outcomes Short Term: Continue to assess and modify interventions until short term weight is achieved;Long Term: Adherence to nutrition and physical activity/exercise program aimed toward attainment of established weight goal;Weight Loss: Understanding of general recommendations for a balanced deficit meal plan, which promotes 1-2 lb weight loss per week and includes a negative energy balance of [phone] kcal/d;Understanding of distribution of calorie intake throughout the day with the consumption of 4-5 meals/snacks;Understanding recommendations for meals to include 15-35% energy as protein, 25-35% energy from fat, 35-60% energy from carbohydrates, less than 271m of dietary cholesterol, 20-35 gm of total fiber daily    Intervention Provide education, individualized exercise plan and daily activity instruction to help decrease symptoms of SOB with activities of daily living.    Expected Outcomes Short Term: Improve cardiorespiratory fitness to achieve a reduction of symptoms when performing ADLs;Long Term: Be able to perform more ADLs without symptoms or delay the onset of symptoms    Increase knowledge of respiratory medications and ability to use respiratory devices properly  Yes    Intervention Provide education and demonstration as needed of appropriate use of medications, inhalers, and oxygen therapy.    Expected Outcomes Short Term: Achieves understanding of medications use. Understands that oxygen is a medication prescribed by physician. Demonstrates appropriate use of inhaler and oxygen therapy.;Long Term: Maintain appropriate use of medications, inhalers, and oxygen therapy.    Diabetes Yes    Intervention Provide education about signs/symptoms and action to take for hypo/hyperglycemia.;Provide education about proper nutrition, including  hydration, and aerobic/resistive exercise prescription along with prescribed medications to achieve blood glucose in normal ranges: Fasting glucose 65-99 mg/dL    Expected Outcomes Short Term: Participant verbalizes understanding of the signs/symptoms and immediate care of hyper/hypoglycemia, proper foot care and importance of medication, aerobic/resistive exercise and nutrition plan for blood glucose control.;Long Term: Attainment of HbA1C < 7%.    Hypertension Yes    Intervention Provide education on lifestyle modifcations including regular physical activity/exercise, weight management, moderate sodium restriction and increased consumption of fresh fruit, vegetables, and low fat dairy, alcohol moderation, and smoking cessation.;Monitor prescription use compliance.    Expected Outcomes Short Term: Continued assessment and intervention until BP is < 140/987mHG in hypertensive participants. < 130/8029mG in hypertensive participants with diabetes, heart failure or chronic kidney disease.;Long Term: Maintenance of blood pressure at goal levels.    Lipids Yes    Intervention Provide education and support for participant on nutrition & aerobic/resistive exercise along with prescribed medications to achieve LDL <75m25mDL >40mg18m Expected Outcomes Short Term: Participant states understanding of desired cholesterol values and is compliant with medications prescribed. Participant is following exercise prescription and nutrition guidelines.;Long Term: Cholesterol controlled with medications as prescribed, with individualized exercise  RX and with personalized nutrition plan. Value goals: LDL < 47m, HDL > 40 mg.             Education:Diabetes - Individual verbal and written instruction to review signs/symptoms of diabetes, desired ranges of glucose level fasting, after meals and with exercise. Acknowledge that pre and post exercise glucose checks will be done for 3 sessions at entry of program. Flowsheet  [facility]  Date 05/08/22  [facility] Instruction Review Code 1- Verbalizes Understanding       Know Your Numbers and Heart Failure: - Group verbal and visual instruction to discuss disease risk factors for cardiac and pulmonary disease and treatment options.  Reviews associated critical values for Overweight/Obesity, Hypertension, Cholesterol, and Diabetes.  Discusses basics of heart failure: signs/symptoms and treatments.  Introduces Heart Failure Zone chart for action plan for heart failure.  Written material given at graduation. Flow[facility]  Date 07/27/22  Educator [facility]  Instruction Review Code 1- Verbalizes Understanding       Core Components/Risk Factors/Patient Goals Review:   Goals and Risk Factor Review     Row Name 07/04/22 1126 08/01/22 1121 09/07/22 1132 10/03/22 1123 10/31/22 1304     Core Components/Risk Factors/Patient Goals Review   Personal Goals Review Weight Management/Obesity;Diabetes;Hypertension;Improve shortness of breath with ADL's Weight Management/Obesity;Diabetes;Hypertension;Improve shortness of breath with ADL's;Increase knowledge of respiratory medications and ability to use respiratory devices properly. Diabetes;Improve shortness of breath with ADL's;Increase knowledge of respiratory medications and ability to use respiratory devices properly.;Hypertension Diabetes;Improve shortness of breath with ADL's;Increase knowledge of respiratory medications and ability to use respiratory devices properly.;Hypertension;Weight Management/Obesity Diabetes;Improve shortness of breath with ADL's;Increase knowledge of respiratory medications and ability to use respiratory devices properly.;Hypertension;Weight Management/Obesity   Review MRonalee Beltsjust started the program not too long ago. His doctor recently changed his BP medications about 3 weeks ago  and has been feeling slightly dizzy from it. He reports BP stays stable and now low. Told him to let them know as soon as possible just in case they need to make changes. He checks his blood sugars once/ week as what he was advised by the doctor and states it is always under 130..Marland KitchenHe has lost 30 lbs since last year which was intentional for the most part. He is looking to lose more, just reiterated to make sure he is not losing muscle mass. He stopped drinking regular soda and switched to diet which he thinks made a huge difference. He stays compliant with medications MRonalee Beltsis doing well in rehab.  His weight is down some and his dieruetic is working well, he just watches his timing of it to make sure he is not out when it hits.  He can tell a difference when he misses his dose as his feet will swell.  [facility].  His pressures are doing well as are his sugars.  He continues to keep eye on them and takes his meds nights. His breathing is doing a little better.  He uses his inhaler routinely but only nebulizer when he feels clogged.  He was encourged to use nebulizer more frequently to help. MRonalee Beltsis doing well  in rehab. His breathing is not as good right now and chronic sinus issues have been getting worse recently; he has an appointment Monday and would like to ask them  for a chest x-ray as he is afraid he has pneumonia again; he had it before in June. He continues to use PLB and nose strips to help with shortness of breath and he feels it is helpful. He rpeorts his oxygen has been doing well when he checks it at home. [name] reports he saw his cardiologist last week and put him on a new medication for his Afib. He doesn't check his BP as often at home anymore- encouraged him to check it at least 2x/week at home. [name] reports his BG has been running ok with metformin <130 fasting; he checks it weekly. He continues to take his medications as prescribed with no issues.  [name] rpeorts also having foot pain right now and trouble with his balance. [name] is doing well in rehab. He reports that he has gained a little weight recently due to his medication and overeating. He would like to lose around 5 pounds. He has been checking his blood sugar regularly and states that it has been staying within normal ranges. [name] has not been checking his BP regularly, but does occasionally check it when he feels off.  He states that he is still taking all his medications as prescribed. [name] continues to work on his weight.  It continues to trend down.  His BP and sugars are doing well overall.  He is pleased with the progress he is making.  His breathing is also getting better overall and he is using his PLB on a routine basis.  He is good with using his nebulizers and inhalers routinely.   Expected Outcomes Short: Monitor BP closely and talk to doctor about dizziness Long: Continue to manage lifestyle risk factors Short: Try to use nebulizer more frequently Long: conitnue to work on weight loss Short: See MD Monday  Long: conitnue to work on weight loss Short: continue to work on weight loss. Long: conitnue to manage lifestyle risk factors. Short: Conitnue to work on weight loss Long; Continue to monitor risk factors            Core Components/Risk Factors/Patient Goals at Discharge (Final Review):   Goals and Risk Factor Review - 10/31/22 1304       Core Components/Risk Factors/Patient Goals Review   Personal Goals Review Diabetes;Improve shortness of breath with ADL's;Increase knowledge of respiratory medications and ability to use respiratory devices properly.;Hypertension;Weight Management/Obesity    Review [name] continues to work on his weight.  It continues to trend down.  His BP and sugars are doing well overall.  He is pleased with the progress he is making.  His breathing is also getting better overall and he is using his PLB on a routine basis.  He is good with using his  nebulizers and inhalers routinely.    Expected Outcomes Short: Conitnue to work on weight loss Long; Continue to monitor risk factors             ITP Comments:  ITP Comments     Row Name 06/19/22 1443 06/22/22 1045 06/28/22 1024 07/26/22 0804 08/23/22 1338   ITP Comments Completed 6MWT and gym orientation. Initial ITP created and sent for review to Dr. Zetta Bills, Medical Director. First full day of exercise!  Patient was oriented to gym and equipment including functions, settings, policies, and procedures.  Patient's individual exercise prescription and treatment plan were reviewed.  All starting workloads were established based on the results of the 6 minute walk test done at initial orientation visit.  The plan for  exercise progression was also introduced and progression will be customized based on patient's performance and goals. 30 Day review completed. Medical Director ITP review done, changes made as directed, and signed approval by Medical Director.   NEW 30 Day review completed. Medical Director ITP review done, changes made as directed, and signed approval by Medical Director. 30 Day review completed. Medical Director ITP review done, changes made as directed, and signed approval by Medical Director.    [place] Name 09/20/22 0743 10/18/22 1001 11/15/22 0843 11/29/22 1430     ITP Comments 30 Day review completed. Medical Director ITP review done, changes made as directed, and signed approval by Medical Director. 30 Day review completed. Medical Director ITP review done, changes made as directed, and signed approval by Medical Director. 30 Day review completed. Medical Director ITP review done, changes made as directed, and signed approval by Medical Director. Osher graduated today from  rehab with 33 sessions completed.  Details of the patient's exercise prescription and what He needs to do in order to continue the prescription and progress were discussed with patient.  Patient was given a  copy of prescription and goals.  Patient verbalized understanding.  Danzel plans to continue to exercise by walking.             Comments: Discharge ITP

## 2023-01-02 ENCOUNTER — Emergency Department: Payer: No Typology Code available for payment source

## 2023-01-02 ENCOUNTER — Inpatient Hospital Stay
Admission: EM | Admit: 2023-01-02 | Discharge: 2023-01-04 | DRG: 287 | Disposition: A | Discharge status: Home / Self Care | Payer: No Typology Code available for payment source | Attending: Internal Medicine | Admitting: Internal Medicine

## 2023-01-02 ENCOUNTER — Other Ambulatory Visit: Payer: Self-pay

## 2023-01-02 ENCOUNTER — Observation Stay
Admit: 2023-01-02 | Discharge: 2023-01-02 | Disposition: A | Discharge status: Home / Self Care | Payer: No Typology Code available for payment source | Attending: Internal Medicine | Admitting: Internal Medicine

## 2023-01-02 DIAGNOSIS — I480 Paroxysmal atrial fibrillation: Secondary | ICD-10-CM | POA: Diagnosis not present

## 2023-01-02 DIAGNOSIS — Z79899 Other long term (current) drug therapy: Secondary | ICD-10-CM

## 2023-01-02 DIAGNOSIS — J449 Chronic obstructive pulmonary disease, unspecified: Secondary | ICD-10-CM | POA: Diagnosis present

## 2023-01-02 DIAGNOSIS — I214 Non-ST elevation (NSTEMI) myocardial infarction: Secondary | ICD-10-CM | POA: Diagnosis present

## 2023-01-02 DIAGNOSIS — E119 Type 2 diabetes mellitus without complications: Secondary | ICD-10-CM | POA: Diagnosis not present

## 2023-01-02 DIAGNOSIS — Z7901 Long term (current) use of anticoagulants: Secondary | ICD-10-CM

## 2023-01-02 DIAGNOSIS — Z6830 Body mass index (BMI) 30.0-30.9, adult: Secondary | ICD-10-CM

## 2023-01-02 DIAGNOSIS — I5032 Chronic diastolic (congestive) heart failure: Secondary | ICD-10-CM | POA: Diagnosis not present

## 2023-01-02 DIAGNOSIS — J9611 Chronic respiratory failure with hypoxia: Secondary | ICD-10-CM | POA: Diagnosis present

## 2023-01-02 DIAGNOSIS — Z881 Allergy status to other antibiotic agents status: Secondary | ICD-10-CM

## 2023-01-02 DIAGNOSIS — I252 Old myocardial infarction: Secondary | ICD-10-CM

## 2023-01-02 DIAGNOSIS — Z888 Allergy status to other drugs, medicaments and biological substances status: Secondary | ICD-10-CM

## 2023-01-02 DIAGNOSIS — I5A Non-ischemic myocardial injury (non-traumatic): Secondary | ICD-10-CM

## 2023-01-02 DIAGNOSIS — I10 Essential (primary) hypertension: Secondary | ICD-10-CM | POA: Diagnosis present

## 2023-01-02 DIAGNOSIS — R0602 Shortness of breath: Secondary | ICD-10-CM | POA: Diagnosis not present

## 2023-01-02 DIAGNOSIS — Z7984 Long term (current) use of oral hypoglycemic drugs: Secondary | ICD-10-CM

## 2023-01-02 DIAGNOSIS — J439 Emphysema, unspecified: Secondary | ICD-10-CM | POA: Diagnosis present

## 2023-01-02 DIAGNOSIS — R7989 Other specified abnormal findings of blood chemistry: Secondary | ICD-10-CM

## 2023-01-02 DIAGNOSIS — Z7982 Long term (current) use of aspirin: Secondary | ICD-10-CM

## 2023-01-02 DIAGNOSIS — E785 Hyperlipidemia, unspecified: Secondary | ICD-10-CM | POA: Diagnosis present

## 2023-01-02 DIAGNOSIS — Z823 Family history of stroke: Secondary | ICD-10-CM

## 2023-01-02 DIAGNOSIS — I4891 Unspecified atrial fibrillation: Secondary | ICD-10-CM | POA: Diagnosis present

## 2023-01-02 DIAGNOSIS — I110 Hypertensive heart disease with heart failure: Secondary | ICD-10-CM | POA: Diagnosis present

## 2023-01-02 DIAGNOSIS — E876 Hypokalemia: Secondary | ICD-10-CM | POA: Diagnosis present

## 2023-01-02 DIAGNOSIS — Z87891 Personal history of nicotine dependence: Secondary | ICD-10-CM

## 2023-01-02 DIAGNOSIS — Z1152 Encounter for screening for COVID-19: Secondary | ICD-10-CM

## 2023-01-02 DIAGNOSIS — Z9981 Dependence on supplemental oxygen: Secondary | ICD-10-CM

## 2023-01-02 DIAGNOSIS — Z8249 Family history of ischemic heart disease and other diseases of the circulatory system: Secondary | ICD-10-CM

## 2023-01-02 DIAGNOSIS — I25110 Atherosclerotic heart disease of native coronary artery with unstable angina pectoris: Secondary | ICD-10-CM | POA: Diagnosis present

## 2023-01-02 DIAGNOSIS — E1169 Type 2 diabetes mellitus with other specified complication: Secondary | ICD-10-CM

## 2023-01-02 DIAGNOSIS — Z885 Allergy status to narcotic agent status: Secondary | ICD-10-CM

## 2023-01-02 DIAGNOSIS — E669 Obesity, unspecified: Secondary | ICD-10-CM

## 2023-01-02 DIAGNOSIS — K219 Gastro-esophageal reflux disease without esophagitis: Secondary | ICD-10-CM | POA: Diagnosis present

## 2023-01-02 DIAGNOSIS — Z7951 Long term (current) use of inhaled steroids: Secondary | ICD-10-CM

## 2023-01-02 HISTORY — DX: Type 2 diabetes mellitus without complications: E11.9

## 2023-01-02 LAB — RESP PANEL BY RT-PCR (RSV, FLU A&B, COVID)  RVPGX2
Influenza A by PCR: NEGATIVE
Influenza B by PCR: NEGATIVE
Resp Syncytial Virus by PCR: NEGATIVE
SARS Coronavirus 2 by RT PCR: NEGATIVE

## 2023-01-02 LAB — BASIC METABOLIC PANEL
Anion gap: 9 (ref 5–15)
BUN: 11 mg/dL (ref 8–23)
CO2: 28 mmol/L (ref 22–32)
Calcium: 8.5 mg/dL — ABNORMAL LOW (ref 8.9–10.3)
Chloride: 101 mmol/L (ref 98–111)
Creatinine, Ser: 0.83 mg/dL (ref 0.61–1.24)
GFR, Estimated: >60 mL/min (ref >60)
Glucose, Bld: 129 mg/dL — ABNORMAL HIGH (ref 70–99)
Potassium: 3.2 mmol/L — ABNORMAL LOW (ref 3.5–5.1)
Sodium: 138 mmol/L (ref 135–145)

## 2023-01-02 LAB — ECHOCARDIOGRAM COMPLETE
AR max vel: 2.37 cm2
AV Area VTI: 2.51 cm2
AV Area mean vel: 2.32 cm2
AV Mean grad: 3 mmHg
AV Peak grad: 5.9 mmHg
Ao pk vel: 1.21 m/s
Area-P 1/2: 3.08 cm2
Height: 66 in
MV M vel: 2.1 m/s
MV Peak grad: 17.6 mmHg
S' Lateral: 3.9 cm
Weight: 3040 [oz_av]

## 2023-01-02 LAB — TROPONIN I (HIGH SENSITIVITY)
Troponin I (High Sensitivity): 14 ng/L (ref <18)
Troponin I (High Sensitivity): 221 ng/L (ref <18)
Troponin I (High Sensitivity): 294 ng/L (ref <18)
Troponin I (High Sensitivity): 256 ng/L

## 2023-01-02 LAB — CBC
HCT: 40.3 % (ref 39.0–52.0)
Hemoglobin: 12.8 g/dL — ABNORMAL LOW (ref 13.0–17.0)
MCH: 29.5 pg (ref 26.0–34.0)
MCHC: 31.8 g/dL (ref 30.0–36.0)
MCV: 92.9 fL (ref 80.0–100.0)
Platelets: 266 10*3/uL (ref 150–400)
RBC: 4.34 MIL/uL (ref 4.22–5.81)
RDW: 18.1 % — ABNORMAL HIGH (ref 11.5–15.5)
WBC: 10.9 10*3/uL — ABNORMAL HIGH (ref 4.0–10.5)
nRBC: 0 % (ref 0.0–0.2)

## 2023-01-02 LAB — PROTIME-INR
INR: 1.2 (ref 0.8–1.2)
Prothrombin Time: 15.5 seconds — ABNORMAL HIGH (ref 11.4–15.2)

## 2023-01-02 LAB — HEMOGLOBIN A1C
Hgb A1c MFr Bld: 6 % — ABNORMAL HIGH (ref 4.8–5.6)
Mean Plasma Glucose: 125.5 mg/dL

## 2023-01-02 LAB — BRAIN NATRIURETIC PEPTIDE: B Natriuretic Peptide: 104 pg/mL — ABNORMAL HIGH (ref 0.0–100.0)

## 2023-01-02 LAB — CBG MONITORING, ED
Glucose-Capillary: 88 mg/dL (ref 70–99)
Glucose-Capillary: 99 mg/dL (ref 70–99)

## 2023-01-02 LAB — HEPARIN LEVEL (UNFRACTIONATED): Heparin Unfractionated: <0.1 IU/mL — ABNORMAL LOW (ref 0.30–0.70)

## 2023-01-02 LAB — APTT: aPTT: 35 seconds (ref 24–36)

## 2023-01-02 MED ORDER — BUSPIRONE HCL 10 MG PO TABS
10.0000 mg | ORAL_TABLET | Freq: Every day | ORAL | Status: DC
  Administered 2023-01-03 – 2023-01-04 (×2): 10 mg via ORAL
  Filled 2023-01-02: qty 2
  Filled 2023-01-02: qty 1

## 2023-01-02 MED ORDER — DABIGATRAN ETEXILATE MESYLATE 150 MG PO CAPS: 150.0000 mg | ORAL_CAPSULE | Freq: Two times a day (BID) | ORAL | Status: DC

## 2023-01-02 MED ORDER — ARFORMOTEROL TARTRATE 15 MCG/2ML IN NEBU
15.0000 ug | INHALATION_SOLUTION | Freq: Two times a day (BID) | RESPIRATORY_TRACT | Status: DC
  Filled 2023-01-02 (×5): qty 2

## 2023-01-02 MED ORDER — ASPIRIN 81 MG PO CHEW
324.0000 mg | CHEWABLE_TABLET | Freq: Once | ORAL | Status: AC
  Administered 2023-01-02: 324 mg via ORAL
  Filled 2023-01-02: qty 4

## 2023-01-02 MED ORDER — ATORVASTATIN CALCIUM 20 MG PO TABS
40.0000 mg | ORAL_TABLET | Freq: Every day | ORAL | Status: DC
  Administered 2023-01-02 – 2023-01-04 (×3): 40 mg via ORAL
  Filled 2023-01-02 (×3): qty 2

## 2023-01-02 MED ORDER — HEPARIN BOLUS VIA INFUSION
4000.0000 [IU] | Freq: Once | INTRAVENOUS | Status: AC
  Administered 2023-01-02: 4000 [IU] via INTRAVENOUS
  Filled 2023-01-02: qty 4000

## 2023-01-02 MED ORDER — HEPARIN (PORCINE) 25000 UT/250ML-% IV SOLN
1250.0000 [IU]/h | INTRAVENOUS | Status: DC
  Administered 2023-01-02: 950 [IU]/h via INTRAVENOUS
  Administered 2023-01-03: 1250 [IU]/h via INTRAVENOUS
  Filled 2023-01-02 (×2): qty 250

## 2023-01-02 MED ORDER — DILTIAZEM HCL ER COATED BEADS 180 MG PO CP24
300.0000 mg | ORAL_CAPSULE | Freq: Every day | ORAL | Status: DC
  Administered 2023-01-03 – 2023-01-04 (×2): 300 mg via ORAL
  Filled 2023-01-02 (×2): qty 1

## 2023-01-02 MED ORDER — FENTANYL CITRATE PF 50 MCG/ML IJ SOSY
25.0000 ug | PREFILLED_SYRINGE | INTRAMUSCULAR | Status: DC | PRN
  Administered 2023-01-02: 25 ug via INTRAVENOUS
  Filled 2023-01-02: qty 1

## 2023-01-02 MED ORDER — MOMETASONE FURO-FORMOTEROL FUM 200-5 MCG/ACT IN AERO: 2.0000 | INHALATION_SPRAY | Freq: Two times a day (BID) | RESPIRATORY_TRACT | Status: DC

## 2023-01-02 MED ORDER — POTASSIUM CHLORIDE CRYS ER 20 MEQ PO TBCR
40.0000 meq | EXTENDED_RELEASE_TABLET | Freq: Once | ORAL | Status: AC
  Administered 2023-01-02: 40 meq via ORAL
  Filled 2023-01-02: qty 2

## 2023-01-02 MED ORDER — INSULIN ASPART 100 UNIT/ML IJ SOLN
0.0000 [IU] | Freq: Three times a day (TID) | INTRAMUSCULAR | Status: DC
  Administered 2023-01-03: 2 [IU] via SUBCUTANEOUS
  Filled 2023-01-02: qty 1

## 2023-01-02 MED ORDER — ISOSORBIDE MONONITRATE ER 60 MG PO TB24
60.0000 mg | ORAL_TABLET | Freq: Every day | ORAL | Status: DC
  Administered 2023-01-03 – 2023-01-04 (×2): 60 mg via ORAL
  Filled 2023-01-02 (×2): qty 1

## 2023-01-02 MED ORDER — UMECLIDINIUM BROMIDE 62.5 MCG/ACT IN AEPB
1.0000 | INHALATION_SPRAY | Freq: Every day | RESPIRATORY_TRACT | Status: DC
  Administered 2023-01-02 – 2023-01-04 (×2): 1 via RESPIRATORY_TRACT
  Filled 2023-01-02: qty 7

## 2023-01-02 MED ORDER — MELATONIN 5 MG PO TABS
5.0000 mg | ORAL_TABLET | Freq: Every day | ORAL | Status: DC
  Administered 2023-01-02 – 2023-01-03 (×2): 5 mg via ORAL
  Filled 2023-01-02 (×2): qty 1

## 2023-01-02 MED ORDER — IPRATROPIUM-ALBUTEROL 0.5-2.5 (3) MG/3ML IN SOLN
3.0000 mL | Freq: Four times a day (QID) | RESPIRATORY_TRACT | Status: DC | PRN
  Filled 2023-01-02: qty 3

## 2023-01-02 MED ORDER — ALBUTEROL SULFATE (2.5 MG/3ML) 0.083% IN NEBU: 2.5000 mg | INHALATION_SOLUTION | RESPIRATORY_TRACT | Status: DC | PRN

## 2023-01-02 MED ORDER — METOPROLOL TARTRATE 50 MG PO TABS
100.0000 mg | ORAL_TABLET | Freq: Two times a day (BID) | ORAL | Status: DC
  Administered 2023-01-02 – 2023-01-04 (×4): 100 mg via ORAL
  Filled 2023-01-02: qty 2
  Filled 2023-01-02 (×2): qty 4
  Filled 2023-01-02: qty 2

## 2023-01-02 MED ORDER — PERFLUTREN LIPID MICROSPHERE
1.0000 mL | INTRAVENOUS | Status: AC | PRN
  Administered 2023-01-02: 5 mL via INTRAVENOUS

## 2023-01-02 MED ORDER — MOMETASONE FURO-FORMOTEROL FUM 200-5 MCG/ACT IN AERO
2.0000 | INHALATION_SPRAY | Freq: Two times a day (BID) | RESPIRATORY_TRACT | Status: DC
  Administered 2023-01-03 – 2023-01-04 (×4): 2 via RESPIRATORY_TRACT
  Filled 2023-01-02: qty 8.8

## 2023-01-02 MED ORDER — HEPARIN BOLUS VIA INFUSION
2450.0000 [IU] | Freq: Once | INTRAVENOUS | Status: AC
  Administered 2023-01-02: 2450 [IU] via INTRAVENOUS
  Filled 2023-01-02: qty 2450

## 2023-01-02 MED ORDER — GABAPENTIN 300 MG PO CAPS
300.0000 mg | ORAL_CAPSULE | Freq: Three times a day (TID) | ORAL | Status: DC | PRN
  Administered 2023-01-02 – 2023-01-03 (×2): 300 mg via ORAL
  Filled 2023-01-02 (×2): qty 1

## 2023-01-02 MED ORDER — PANTOPRAZOLE SODIUM 40 MG PO TBEC
40.0000 mg | DELAYED_RELEASE_TABLET | Freq: Every day | ORAL | Status: DC
  Administered 2023-01-02 – 2023-01-04 (×3): 40 mg via ORAL
  Filled 2023-01-02 (×3): qty 1

## 2023-01-02 MED ORDER — NITROGLYCERIN 0.4 MG SL SUBL: 0.4000 mg | SUBLINGUAL_TABLET | SUBLINGUAL | Status: DC | PRN

## 2023-01-02 MED ORDER — KETOROLAC TROMETHAMINE 15 MG/ML IJ SOLN
15.0000 mg | Freq: Four times a day (QID) | INTRAMUSCULAR | Status: DC | PRN
  Administered 2023-01-03: 15 mg via INTRAVENOUS
  Filled 2023-01-02: qty 1

## 2023-01-02 MED ORDER — FUROSEMIDE 10 MG/ML IJ SOLN
20.0000 mg | Freq: Once | INTRAMUSCULAR | Status: DC
  Filled 2023-01-02 (×2): qty 4

## 2023-01-02 MED ORDER — FENTANYL CITRATE PF 50 MCG/ML IJ SOSY
50.0000 ug | PREFILLED_SYRINGE | Freq: Once | INTRAMUSCULAR | Status: AC
  Administered 2023-01-02: 50 ug via INTRAVENOUS
  Filled 2023-01-02: qty 1

## 2023-01-02 MED ORDER — ONDANSETRON HCL 4 MG/2ML IJ SOLN: 4.0000 mg | Freq: Four times a day (QID) | INTRAMUSCULAR | Status: DC | PRN

## 2023-01-02 MED ORDER — ASPIRIN 81 MG PO TBEC
81.0000 mg | DELAYED_RELEASE_TABLET | Freq: Every day | ORAL | Status: DC
  Administered 2023-01-03: 81 mg via ORAL
  Filled 2023-01-02 (×2): qty 1

## 2023-01-02 MED ORDER — DILTIAZEM HCL 25 MG/5ML IV SOLN: 15.0000 mg | Freq: Once | INTRAVENOUS | Status: DC

## 2023-01-02 MED ORDER — ACETAMINOPHEN 325 MG PO TABS: 650.0000 mg | ORAL_TABLET | ORAL | Status: DC | PRN

## 2023-01-02 NOTE — ED Triage Notes (Signed)
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

## 2023-01-02 NOTE — Progress Notes (Signed)
  Echocardiogram 2D Echocardiogram has been performed.  Darius Norris 01/02/2023, 4:28 PM

## 2023-01-02 NOTE — Assessment & Plan Note (Deleted)
Patient is presenting with chest pain in the setting of A-fib with RVR.  Troponin initially negative but jumped up to 221.  Differential includes NSTEMI secondary to coronary event, versus demand ischemia in the setting of RVR. Per chart review, patient has a history of CAD s/p NSTEMI in 2001, with last heart cath in 2008 that demonstrated 40% stenosis of the mLCx, and 100% stenosis of the OM2.  Since that time, he had a echocardiogram in 2019 that showed hypocontractile lateral and posterior LV.  - Cardiology consulted; appreciate their recommendations - Continue heparin infusion per pharmacy dosing - S/p aspirin 324 mg - Continue home metoprolol and Imdur - Continue atorvastatin - Echocardiogram ordered - Continue to trend troponins till peak - EKG for any recurrent chest pain - A1c and lipid panel pending

## 2023-01-02 NOTE — Assessment & Plan Note (Deleted)
-  Hold home metformin - A1c pending - SSI, moderate

## 2023-01-02 NOTE — H&P (Addendum)
History and Physical    Patient: Darius Norris OBS:[id number] DOB: 1951/11/22 DOA: 01/02/2023 DOS: the patient was seen and examined on 01/02/2023 PCP: [facility] [place]  Patient coming from: Home  Chief Complaint:  Chief Complaint  Patient presents with   Chest Pain   Shortness of Breath   HPI: Darius Norris is a 72 y.o. male with medical history significant of CAD s/p NSTEMI, atrial fibrillation on Pradaxa, COPD complicated by chronic hypoxic respiratory failure on 3 L of O2, hypertension, hyperlipidemia, who presents to the ED due to chest pain.  Darius Norris states that yesterday, he noticed that he was not feeling well and feeling rundown, he checked his heart rate at the time and it was elevated in the 130s to 140s.  He took his home metoprolol and diltiazem and his heart rate improved.  Today, he woke up again feeling malaised.  In addition, he was experiencing central chest pain that radiated through to his back.  He has never experienced similar pain suggest this when he has had high heart rates in the past.  Due to this, he called EMS.  He denies any recent illnesses.  He denies any missed doses of his medication other than this morning.  He denies any fever, chills, new shortness of breath, no cough.  He denies any diaphoresis or dizziness.  ED course: On arrival to the ED, patient was afebrile at 97.5 with blood pressure 142/99 and heart rate of 147.  He was saturating at 100% on home 3 L.  Initial workup remarkable for WBC of 10.9, potassium of 3.2, glucose of 129.  Initial troponin negative at 14 but increased to 221.  No acute cardiopulmonary abnormalities on chest x-ray.  Initial EKG with atrial fibrillation with a rate of 144. Patient started on diltiazem and heparin infusion for atrial fibrillation with RVR and NSTEMI.  Patient subsequently converted to sinus rhythm.  TRH contacted for admission.  Review of Systems: As mentioned in the history of present illness. All  other systems reviewed and are negative. Past Medical History:  Diagnosis Date   A-fib [facility])    CAD (coronary artery disease)    Colitis    COPD (chronic obstructive pulmonary disease) (HCC)    Emphysema lung (HCC)    GERD (gastroesophageal reflux disease)    Hypertension    MI (myocardial infarction) ([place]) 1999   Past Surgical History:  Procedure Laterality Date   ABDOMINAL HERNIA REPAIR Bilateral    CHOLECYSTECTOMY     COLONOSCOPY WITH PROPOFOL N/A 02/01/2018   Procedure: COLONOSCOPY WITH PROPOFOL;  Surgeon: Jonathon Bellows, MD;  Location: [facility] ENDOSCOPY;  Service: Gastroenterology;  Laterality: N/A;   KNEE ARTHROSCOPY Bilateral    Social History:  reports that he quit smoking about 21 years ago. His smoking use included cigarettes. He has a 30.00 pack-year smoking history. He has never used smokeless tobacco. He reports that he does not drink alcohol and does not use drugs.  Allergies  Allergen Reactions   Ranitidine Hcl Shortness Of Breath   Zantac [Ranitidine Hcl] Shortness Of Breath   Molnupiravir Swelling   Codeine Itching    Other reaction(s): PRURITIS   Erythromycin Hives    Other reaction(s): SWELLING (NON-SPECIFIC), HIVES, SWELLING (NON-SPECIFIC), HIVES   Hydrocodone     Other reaction(s): PRURITIS   Morphine Itching   Morphine And Related Itching   Tiotropium Bromide Monohydrate    Hydrocodone-Acetaminophen Rash   Vicodin [Hydrocodone-Acetaminophen] Rash   Zantac [Ranitidine] Rash  Other reaction(s): Eruption, HIVES, SHORTNESS OF BREATH, Eruption, HIVES, SHORTNESS OF BREATH    Family History  Problem Relation Age of Onset   CAD Father    Stroke Father    CAD Sister    Stroke Brother     Prior to Admission medications   Medication Sig Start Date End Date Taking? Authorizing Provider  acetaminophen (TYLENOL) 325 MG tablet Take 650 mg by mouth every 6 (six) hours as needed.   Yes [provider]  albuterol (VENTOLIN HFA) 108 (90 Base) MCG/ACT  inhaler Inhale into the lungs. 03/30/21  Yes [provider]  aspirin EC 81 MG EC tablet Take 1 tablet (81 mg total) by mouth daily. 06/09/18  Yes [name], Avel Peace, MD  atorvastatin (LIPITOR) 80 MG tablet Take 1 tablet (80 mg total) by mouth at bedtime. Patient taking differently: Take 40 mg by mouth at bedtime. Half of an 33 11/18/17  Yes Vaughan Basta, MD  budesonide-formoterol [facility]) 160-4.5 MCG/ACT inhaler Inhale 2 puffs into the lungs 2 (two) times daily.   Yes [provider]  busPIRone (BUSPAR) 10 MG tablet Take by mouth.   Yes [provider]  dabigatran (PRADAXA) 150 MG CAPS capsule Take 150 mg by mouth 2 (two) times daily.   Yes [provider]  diltiazem (CARDIZEM CD) 300 MG 24 hr capsule Take 1 capsule (300 mg total) by mouth daily. 05/24/22 01/02/23 Yes Enzo Bi, MD  furosemide (LASIX) 40 MG tablet Take 1 tablet (40 mg total) by mouth daily. 05/24/22 01/02/23 Yes Enzo Bi, MD  gabapentin (NEURONTIN) 300 MG capsule Take 1 capsule by mouth 3 (three) times daily as needed. 10/10/21  Yes [provider]  guaifenesin (HUMIBID E) 400 MG TABS tablet Take by mouth. 03/30/21  Yes [provider]  hydrocortisone 2.5 % cream Apply topically. 02/28/21  Yes [provider]  hydrOXYzine (ATARAX/VISTARIL) 25 MG tablet Take by mouth.   Yes [provider]  Ipratropium-Albuterol (COMBIVENT) 20-100 MCG/ACT AERS respimat Inhale 1 puff into the lungs every 6 (six) hours as needed for wheezing or shortness of breath.   Yes [provider]  isosorbide mononitrate (IMDUR) 60 MG 24 hr tablet Take 60 mg by mouth daily.   Yes [provider]  lidocaine (LIDODERM) 5 % Place 1 patch onto the skin daily. Remove & Discard patch within 12 hours or as directed by MD   Yes [provider]  lidocaine (XYLOCAINE) 5 % ointment Apply topically. 03/30/21  Yes [provider]  loratadine (CLARITIN) 10 MG tablet  Take 10 mg by mouth daily as needed for allergies.    Yes [provider]  Melatonin 3 MG CAPS Take 2 capsules by mouth at bedtime. 10/10/21  Yes [provider]  metFORMIN (GLUCOPHAGE-XR) 500 MG 24 hr tablet Take 500 mg by mouth in the morning and at bedtime. 03/30/21  Yes [provider]  metoprolol tartrate (LOPRESSOR) 100 MG tablet Take 1 tablet (100 mg total) by mouth 2 (two) times daily. 05/24/22 01/02/23 Yes Enzo Bi, MD  omeprazole (PRILOSEC) 20 MG capsule Take 20 mg by mouth daily.   Yes [provider]  senna (SENOKOT) 8.6 MG TABS tablet Take 1 tablet by mouth daily as needed for mild constipation.   Yes [provider]  simethicone (MYLICON) 80 MG chewable tablet Norris 80 mg by mouth 4 (four) times daily as needed for flatulence. 03/04/22  Yes [provider]  clobetasol (TEMOVATE) 0.05 % external solution Apply topically.  02/28/21   [provider]  doxycycline (VIBRAMYCIN) 100 MG capsule Take 1 capsule (100 mg total) by mouth 2 (two) times daily. Patient not taking: Reported on 01/02/2023 08/17/22   Ward, Delice Bison, DO  ketorolac (ACULAR) 0.5 % ophthalmic solution INSTILL 1 DROP OPERATIVE EYE FOUR TIMES A DAY FOR POSTOPERATIVE PAIN/INFLAMMATION SEND TO 4B AMB SURGERY OR ON DAY OF SURGERY 04/05/22 Patient not taking: Reported on 01/02/2023 03/29/22   [provider]  methotrexate (RHEUMATREX) 2.5 MG tablet Take 10 mg by mouth once a week. 03/02/21   [provider]  mometasone (ASMANEX) 220 MCG/INH inhaler Take by mouth. Patient not taking: Reported on 01/02/2023 03/10/21   [provider]  moxifloxacin (VIGAMOX) 0.5 % ophthalmic solution INSTILL 1 DROP IN LEFT EYE FOUR TIMES A DAY FOR CATARACT SURGERY SEND TO 4B AMB SURGERY OR ON DAY OF SURGERY 04/05/22 Patient not taking: Reported on 01/02/2023 03/29/22   [provider]  predniSONE (STERAPRED UNI-PAK 21 TAB) 10 MG (21) TBPK tablet Per packaging  instructions Patient not taking: Reported on 01/02/2023 09/14/22   Nance Pear, MD    Physical Exam: Vitals:   01/02/23 1211 01/02/23 1230 01/02/23 1345 01/02/23 1430  BP: 131/65 139/69  (!) 149/83  Pulse: 64 65  76  Resp: '18 18  17  '$ Temp:   (!) 97.5 F (36.4 C)   TempSrc:   Oral   SpO2: 99% 95%  100%  Weight:      Height:       Physical Exam Vitals and nursing note reviewed.  Constitutional:      Appearance: He is obese.  HENT:     Head: Normocephalic and atraumatic.  Eyes:     Extraocular Movements: Extraocular movements intact.     Pupils: Pupils are equal, round, and reactive to light.  Cardiovascular:     Rate and Rhythm: Normal rate and regular rhythm.     Pulses:          Radial pulses are 2+ on the right side and 2+ on the left side.       Dorsalis pedis pulses are 2+ on the right side and 2+ on the left side.     Heart sounds: No murmur heard. Pulmonary:     Effort: Pulmonary effort is normal.     Breath sounds: Decreased breath sounds (throughout but predominantly in the upper lobes bilaterally) present. No wheezing, rhonchi or rales.  Abdominal:     General: Bowel sounds are normal.     Palpations: Abdomen is soft.     Tenderness: There is no abdominal tenderness.  Musculoskeletal:     Cervical back: Neck supple.     Right lower leg: 1+ Pitting Edema present.     Left lower leg: 1+ Pitting Edema present.  Skin:    General: Skin is warm and dry.     Coloration: Skin is cyanotic (Bilateral toes mildly cyanotic).     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     Mental Status: He is alert and oriented to person, place, and time.  Psychiatric:        Mood and Affect: Mood normal.        Behavior: Behavior normal.   Data Reviewed: CBC with WBC of 10.9, hemoglobin of 12.8, MCV of 92, platelets of 266 BMP with potassium of 3.2, bicarb of 28, glucose of  129, BUN of 11, creatinine 0.8 with GFR over 60 Initial troponin 14, with trend to 221 COVID-19, influenza and RSV negative  Initial EKG obtained at 853 personally reviewed.  Narrow complex tachycardia.  P waves are present, so likely sinus tachycardia.  Rate of 144.  Most recent EKG personally reviewed.  Sinus rhythm with a rate of 76.  Nonspecific T wave inversion, but otherwise no EKG changes consistent with acute ischemia.  Compared to EKG obtained on May 22, 2022, T wave inversions are not new.  DG Chest 2 View  Result Date: 01/02/2023 CLINICAL DATA:  Shortness of breath, chest pain, atrial flutter with rapid heart rate and elevated blood pressure. History atrial fibrillation, MI, COPD EXAM: CHEST - 2 VIEW COMPARISON:  09/14/2022 FINDINGS: Normal heart size and pulmonary vascularity. Atherosclerotic calcification aorta. Prominent fat pad at RIGHT cardiophrenic angle unchanged. Emphysematous changes with interstitial prominence of the mid to lower lungs bilaterally similar to previous study. No definite acute infiltrate, pleural effusion, or pneumothorax. Osseous structures unremarkable. IMPRESSION: Again identified emphysematous changes with chronic interstitial prominence of the mid to lower lungs. No acute abnormalities. Electronically Signed   By: Lavonia Dana M.D.   On: 01/02/2023 09:46    There are no new results to review at this time.  Assessment and Plan:  * NSTEMI (non-ST elevated myocardial infarction) [facility]) Patient is presenting with chest pain in the setting of A-fib with RVR.  Troponin initially negative but jumped up to 221.  Differential includes NSTEMI secondary to coronary event, versus demand ischemia in the setting of RVR. Per chart review, patient has a history of CAD s/p NSTEMI in 2001, with last heart cath in 2008 that demonstrated 40% stenosis of the mLCx, and 100% stenosis of the OM2.  Since that time, he had a echocardiogram in 2019 that showed hypocontractile lateral  and posterior LV.  - Cardiology consulted; appreciate their recommendations - Continue heparin infusion per pharmacy dosing - S/p aspirin 324 mg - Continue home metoprolol and Imdur - Continue atorvastatin - Echocardiogram ordered - Continue to trend troponins till peak - EKG for any recurrent chest pain - A1c and lipid panel pending  Atrial fibrillation with RVR ([place]) Patient has a history of atrial fibrillation with frequent RVR on metoprolol and diltiazem at home in addition to Pradaxa for anticoagulation.  After receiving diltiazem injection, he converted back to sinus rhythm.  - Telemetry monitoring - Restart home metoprolol and diltiazem - Continue home Pradaxa  Chronic diastolic CHF (congestive heart failure) ([place]) Patient is mildly hypervolemic at this time with +1 pitting edema up to the knees.  He currently is on Lasix 40 mg at home and states this was recently restarted by his [place] doctor.  - BNP pending - Echocardiogram ordered - Lasix 20 mg IV once - Potassium supplementation  Diabetes mellitus without complication ([place]) - Hold home metformin - A1c pending - SSI, moderate  HTN (hypertension) - Continue home antihypertensives  COPD (chronic obstructive pulmonary disease) (HCC) No wheezing or increased pulmonary symptoms at this time.  - Continue home bronchodilators and supplemental oxygen  Advance Care Planning:   Code Status: Full Code.  Verified by patient  Consults: Cardiology  Family Communication: Patient's wife updated at bedside  Severity of Illness: The appropriate patient status for this patient is OBSERVATION. Observation status is judged to be reasonable and necessary in order to provide the required intensity of service to ensure the patient's safety. The patient's presenting symptoms, physical exam findings, and initial  radiographic and laboratory data in the context of their medical condition is felt to place them at decreased risk for further  clinical deterioration. Furthermore, it is anticipated that the patient will be medically stable for discharge from the hospital within 2 midnights of admission.   Author: Jose Persia, MD 01/02/2023 4:03 PM  For on call review [email or web address].

## 2023-01-02 NOTE — Assessment & Plan Note (Addendum)
Respiratory status stable.  Continue inhalers.

## 2023-01-02 NOTE — Assessment & Plan Note (Addendum)
Paroxysmal in nature.  Patient in normal sinus rhythm.  Continue Cardizem and metoprolol for rate control.  Continue Pradaxa for anticoagulation.

## 2023-01-02 NOTE — Consult Note (Signed)
CARDIOLOGY CONSULT NOTE               Patient ID: Darius Norris MRN: [id number] DOB/AGE: [birth year] 72 y.o.  Admit date: 01/02/2023 Referring Physician Dr Revonda Humphrey hospitalist Primary Phys[facility] Primary Cardiologist Dr. Saralyn Pilar Reason for Consultation unstable angina evaded troponins possible non-STEMI  HPI: Patient is a 72 year old male normally goes to the [facility] sees Dr. Saralyn Pilar presents with known history of coronary disease previous non-STEMI complains of rapid atrial fibrillation but also significant chest pain which is unusual for him.  Patient describes pain in his chest that radiated to his back patient has been on Pradaxa for atrial fibrillation but he presented with rapid atrial fibrillation has a history of moderate to severe COPD on 3 L home oxygen for hypoxemia is hypertension hyperlipidemia send once admitted to [facility] for chest pain possible stable angina EKG nonspecific ST-T wave changes patient required nitrates supplemental oxygen and inhalers as well as rate control.  Patient was found to have elevated troponins.  Review of systems complete and found to be negative unless listed above     Past Medical History:  Diagnosis Date   A-fib ([place])    CAD (coronary artery disease)    Colitis    COPD (chronic obstructive pulmonary disease) (HCC)    Emphysema lung (HCC)    GERD (gastroesophageal reflux disease)    Hypertension    MI (myocardial infarction) ([place]) 1999    Past Surgical History:  Procedure Laterality Date   ABDOMINAL HERNIA REPAIR Bilateral    CHOLECYSTECTOMY     COLONOSCOPY WITH PROPOFOL N/A 02/01/2018   Procedure: COLONOSCOPY WITH PROPOFOL;  Surgeon: Jonathon Bellows, MD;  Location: [facility] ENDOSCOPY;  Service: Gastroenterology;  Laterality: N/A;   KNEE ARTHROSCOPY Bilateral     (Not in a hospital admission)  Social History   Socioeconomic History   Marital status: Married    Spouse name: Not on file   Number of  children: Not on file   Years of education: Not on file   Highest education level: Not on file  Occupational History   Not on file  Tobacco Use   Smoking status: Former    Packs/day: 1.00    Years: 30.00    Total pack years: 30.00    Types: Cigarettes    Quit date: 12/18/2001    Years since quitting: 21.0   Smokeless tobacco: Never  Vaping Use   Vaping Use: Never used  Substance and Sexual Activity   Alcohol use: No   Drug use: No   Sexual activity: Never  Other Topics Concern   Not on file  Social History Narrative   Not on file   Social Determinants of Health   Financial Resource Strain: Not on file  Food Insecurity: Not on file  Transportation Needs: Not on file  Physical Activity: Not on file  Stress: Not on file  Social Connections: Not on file  Intimate Partner Violence: Not on file    Family History  Problem Relation Age of Onset   CAD Father    Stroke Father    CAD Sister    Stroke Brother       Review of systems complete and found to be negative unless listed above      PHYSICAL EXAM  General: Well developed, well nourished, in no acute distress on supplemental oxygen HEENT:  Normocephalic and atramatic Neck:  No JVD.  Lungs: Clear bilaterally to auscultation and percussion. Heart: HRRR . Normal  S1 and S2 without gallops or murmurs.  Abdomen: Bowel sounds are positive, abdomen soft and non-tender  Msk:  Back normal, normal gait. Normal strength and tone for age. Extremities: No clubbing, cyanosis or edema.   Neuro: Alert and oriented X 3. Psych:  Good affect, responds appropriately  Labs:   Lab Results  Component Value Date   WBC 10.9 (H) 01/02/2023   HGB 12.8 (L) 01/02/2023   HCT 40.3 01/02/2023   MCV 92.9 01/02/2023   PLT 266 01/02/2023    Recent Labs  Lab 01/02/23 0900  NA 138  K 3.2*  CL 101  CO2 28  BUN 11  CREATININE 0.83  CALCIUM 8.5*  GLUCOSE 129*   Lab Results  Component Value Date   CKTOTAL 74 02/05/2013   CKMB  0.9 02/05/2013   TROPONINI <0.03 01/05/2019   TROPONINI <0.03 01/05/2019    Lab Results  Component Value Date   CHOL 106 12/06/2021   CHOL 116 09/04/2016   CHOL 185 02/04/2013   Lab Results  Component Value Date   HDL 33 (L) 12/06/2021   HDL 42 09/04/2016   HDL 38 (L) 02/04/2013   Lab Results  Component Value Date   LDLCALC 51 12/06/2021   LDLCALC 51 09/04/2016   LDLCALC 114 (H) 02/04/2013   Lab Results  Component Value Date   TRIG 108 12/06/2021   TRIG 116 09/04/2016   TRIG 166 02/04/2013   Lab Results  Component Value Date   CHOLHDL 3.2 12/06/2021   CHOLHDL 2.8 09/04/2016   No results found for: "LDLDIRECT"    Radiology: ECHOCARDIOGRAM COMPLETE  Result Date: 01/02/2023    ECHOCARDIOGRAM REPORT   Patient Name:   Darius Norris Date of Exam: 01/02/2023 Medical Rec #:  [id number]       Height:       66.0 in Accession #:    [id number]      Weight:       190.0 lb Date of Birth:  10-08-1951       BSA:          1.957 m Patient Age:    19 years        BP:           167/105 mmHg Patient Gender: M               HR:           78 bpm. Exam Location:  [facility] Procedure: 2D Echo and Intracardiac Opacification Agent Indications:     NSTEMI  History:         Patient has prior history of Echocardiogram examinations, most                  recent 05/16/2022. CHF, COPD; Risk Factors:Hypertension,                  Diabetes and Dyslipidemia.  Sonographer:     Harvie Junior Referring Phys:  [id number] Jose Persia Diagnosing Phys: Yolonda Kida MD  Sonographer Comments: Technically difficult study due to poor echo windows. Image acquisition challenging due to COPD. IMPRESSIONS  1. Left ventricular ejection fraction, by estimation, is 55 to 60%. The left ventricle has normal function. The left ventricle has no regional wall motion abnormalities. The left ventricular internal cavity size was moderately to severely dilated. Left ventricular diastolic parameters are consistent with Grade I diastolic  dysfunction (impaired relaxation).  2. Right ventricular systolic function is low normal. The right ventricular size is  moderately enlarged. Mildly increased right ventricular wall thickness.  3. Right atrial size was mildly dilated.  4. The mitral valve is normal in structure. Trivial mitral valve regurgitation.  5. The aortic valve is normal in structure. Aortic valve regurgitation is not visualized. FINDINGS  Left Ventricle: Left ventricular ejection fraction, by estimation, is 55 to 60%. The left ventricle has normal function. The left ventricle has no regional wall motion abnormalities. Definity contrast agent was given IV to delineate the left ventricular  endocardial borders. The left ventricular internal cavity size was moderately to severely dilated. There is borderline concentric left ventricular hypertrophy. Left ventricular diastolic parameters are consistent with Grade I diastolic dysfunction (impaired relaxation). Right Ventricle: The right ventricular size is moderately enlarged. Mildly increased right ventricular wall thickness. Right ventricular systolic function is low normal. Left Atrium: Left atrial size was normal in size. Right Atrium: Right atrial size was mildly dilated. Pericardium: There is no evidence of pericardial effusion. Mitral Valve: The mitral valve is normal in structure. Trivial mitral valve regurgitation. Tricuspid Valve: The tricuspid valve is normal in structure. Tricuspid valve regurgitation is not demonstrated. Aortic Valve: The aortic valve is normal in structure. Aortic valve regurgitation is not visualized. Aortic valve mean gradient measures 3.0 mmHg. Aortic valve peak gradient measures 5.9 mmHg. Aortic valve area, by VTI measures 2.51 cm. Pulmonic Valve: The pulmonic valve was normal in structure. Pulmonic valve regurgitation is not visualized. Aorta: The ascending aorta was not well visualized. IAS/Shunts: No atrial level shunt detected by color flow Doppler.  LEFT  VENTRICLE PLAX 2D LVIDd:         5.80 cm   Diastology LVIDs:         3.90 cm   LV e' medial:    6.20 cm/s LV PW:         1.00 cm   LV E/e' medial:  5.4 LV IVS:        1.10 cm   LV e' lateral:   6.74 cm/s LVOT diam:     1.90 cm   LV E/e' lateral: 5.0 LV SV:         60 LV SV Index:   31 LVOT Area:     2.84 cm  RIGHT VENTRICLE RV Basal diam:  3.60 cm RV Mid diam:    3.40 cm RV S prime:     18.00 cm/s TAPSE (M-mode): 1.9 cm LEFT ATRIUM           Index        RIGHT ATRIUM           Index LA diam:      3.10 cm 1.58 cm/m   RA Area:     12.00 cm LA Vol (A4C): 26.8 ml 13.70 ml/m  RA Volume:   26.10 ml  13.34 ml/m  AORTIC VALVE                    PULMONIC VALVE AV Area (Vmax):    2.37 cm     PV Vmax:       0.92 m/s AV Area (Vmean):   2.32 cm     PV Peak grad:  3.4 mmHg AV Area (VTI):     2.51 cm AV Vmax:           121.00 cm/s AV Vmean:          80.800 cm/s AV VTI:            0.238 m AV Peak Grad:  5.9 mmHg AV Mean Grad:      3.0 mmHg LVOT Vmax:         101.00 cm/s LVOT Vmean:        66.000 cm/s LVOT VTI:          0.211 m LVOT/AV VTI ratio: 0.89  AORTA Ao Root diam: 4.20 cm MITRAL VALVE MV Area (PHT): 3.08 cm    SHUNTS MV Decel Time: 246 msec    Systemic VTI:  0.21 m MR Peak grad: 17.6 mmHg    Systemic Diam: 1.90 cm MR Vmax:      210.00 cm/s MV E velocity: 33.50 cm/s MV A velocity: 79.10 cm/s MV E/A ratio:  0.42 Oneita Allmon D Jezebelle Ledwell MD Electronically signed by Yolonda Kida MD Signature Date/Time: 01/02/2023/5:53:04 PM    Final    DG Chest 2 View  Result Date: 01/02/2023 CLINICAL DATA:  Shortness of breath, chest pain, atrial flutter with rapid heart rate and elevated blood pressure. History atrial fibrillation, MI, COPD EXAM: CHEST - 2 VIEW COMPARISON:  09/14/2022 FINDINGS: Normal heart size and pulmonary vascularity. Atherosclerotic calcification aorta. Prominent fat pad at RIGHT cardiophrenic angle unchanged. Emphysematous changes with interstitial prominence of the mid to lower lungs bilaterally similar to  previous study. No definite acute infiltrate, pleural effusion, or pneumothorax. Osseous structures unremarkable. IMPRESSION: Again identified emphysematous changes with chronic interstitial prominence of the mid to lower lungs. No acute abnormalities. Electronically Signed   By: Lavonia Dana M.D.   On: 01/02/2023 09:46    EKG: Atrial fibrillation rate controlled nonspecific ST depression 1S  ASSESSMENT AND PLAN:  Unstable angina Possible non-STEMI Elevated troponin Multivessel coronary disease Shortness of breath history of myocardial infarction History of non-STEMI COPD History of atrial fibrillation  Hyperlipidemia Chronic hypoxic respiratory failure on 3 L GERD Emphysema . Plan Agree admit to telemetry rule out myocardial infarction Nitrate therapy aspirin Cardizem consider beta-blocker Follow-up troponins and EKGs Short-term anticoagulation with heparin on hold Pradaxa Rate control for atrial fibrillation Continue supplemental oxygen and inhalers Agree with statin therapy for hyperlipidemia Continue Protonix for GERD type symptoms Diltiazem therapy for rate control and blood pressure management Echocardiogram for assessment left ventricular function wall motion Consider cardiac cath invasive evaluation prior to discharge     Signed: Yolonda Kida MD 01/02/2023, 6:00 PM

## 2023-01-02 NOTE — ED Notes (Signed)
Blue, lt grn and lav tubes sent to lab.

## 2023-01-02 NOTE — Consult Note (Signed)
[place] for IV Heparin Indication: chest pain/ACS  Patient Measurements: Height: '5\' 6"'$  (167.6 cm) Weight: 86.2 kg (190 lb) IBW/kg (Calculated) : 63.8 Heparin Dosing Weight: 81.7 kg  Labs: Recent Labs    01/02/23 0900 01/02/23 1136 01/02/23 1337 01/02/23 1520 01/02/23 1826 01/02/23 2150  HGB 12.8*  --   --   --   --   --   HCT 40.3  --   --   --   --   --   PLT 266  --   --   --   --   --   APTT  --   --  35  --   --   --   LABPROT  --   --  15.5*  --   --   --   INR  --   --  1.2  --   --   --   HEPARINUNFRC  --   --   --   --   --  <0.10*  CREATININE 0.83  --   --   --   --   --   TROPONINIHS 14 221*  --  294* 256*  --      Estimated Creatinine Clearance: 84.1 mL/min (by C-G formula based on SCr of 0.83 mg/dL).   Medical History: Past Medical History:  Diagnosis Date   A-fib ([place])    CAD (coronary artery disease)    Colitis    COPD (chronic obstructive pulmonary disease) (HCC)    Emphysema lung (HCC)    GERD (gastroesophageal reflux disease)    Hypertension    MI (myocardial infarction) ([place]) 1999    Medications:  Pradaxa 150 mg BID prior to admission. Last patient reported dose was 01/01/23  Assessment: 72 y/o M with medical history including Afib on Pradaxa, CAD / MI who presented to the ED 1/16 with SOB and chest pain. Troponin rising, 14 >> 221. Pharmacy consulted to initiate and manage heparin infusion for suspected ACS.  Baseline aPTT and PT-INR are pending. Baseline CBC overall within normal limits.  Goal of Therapy:  Heparin level 0.3-0.7 units/ml Monitor platelets by anticoagulation protocol: Yes   Plan:  1/16:  HL @ 2218 = < 0.1, SUBtherapeutic - RN states there have been no interruptions in heparin infusion - Will order heparin 2450 units IV X 1 bolus and increase drip rate to 1250 units/hr. - Will recheck HL 8 hrs after rate change ----Daily CBC per protocol while on IV heparin  Tylin Stradley  D 01/02/2023,11:13 PM

## 2023-01-02 NOTE — ED Notes (Signed)
[name] Balboa, MD, made aware of critical troponin 221

## 2023-01-02 NOTE — ED Notes (Signed)
Pt transferred from Main ED.

## 2023-01-02 NOTE — Consult Note (Addendum)
ANTICOAGULATION CONSULT NOTE  Pharmacy Consult for IV Heparin Indication: chest pain/ACS  Patient Measurements: Height: '5\' 6"'$  (167.6 cm) Weight: 86.2 kg (190 lb) IBW/kg (Calculated) : 63.8 Heparin Dosing Weight: 81.7 kg  Labs: Recent Labs    01/02/23 0900 01/02/23 1136  HGB 12.8*  --   HCT 40.3  --   PLT 266  --   CREATININE 0.83  --   TROPONINIHS 14 221*    Estimated Creatinine Clearance: 84.1 mL/min (by C-G formula based on SCr of 0.83 mg/dL).   Medical History: Past Medical History:  Diagnosis Date   A-fib ([place])    CAD (coronary artery disease)    Colitis    COPD (chronic obstructive pulmonary disease) (HCC)    Emphysema lung (HCC)    GERD (gastroesophageal reflux disease)    Hypertension    MI (myocardial infarction) ([place]) 1999    Medications:  Pradaxa 150 mg BID prior to admission. Last patient reported dose was 01/01/23  Assessment: 72 y/o M with medical history including Afib on Pradaxa, CAD / MI who presented to the ED 1/16 with SOB and chest pain. Troponin rising, 14 >> 221. Pharmacy consulted to initiate and manage heparin infusion for suspected ACS.  Baseline aPTT and PT-INR are pending. Baseline CBC overall within normal limits.  Goal of Therapy:  Heparin level 0.3-0.7 units/ml Monitor platelets by anticoagulation protocol: Yes   Plan:  --Heparin 4000 unit IV bolus followed by continuous infusion at 950 units/hr --HL 8 hours from initiation of infusion --Daily CBC per protocol while on IV heparin  Benita Gutter 01/02/2023,1:21 PM

## 2023-01-02 NOTE — ED Notes (Signed)
Pt leaving for imaging. Will repeat EKG once pt back to room; verbal hold on cardizem by EDP [name] Balboa since pt's rate currently sustained in 70s. Will give pain med once pt back to room.

## 2023-01-02 NOTE — Assessment & Plan Note (Signed)
-  Continue home antihypertensives 

## 2023-01-02 NOTE — ED Provider Notes (Signed)
[facility] Provider Note    Event Date/Time   First MD Initiated Contact with Patient 01/02/23 [phone]     (approximate)   History   Chest Pain and Shortness of Breath   HPI  Darius Norris is a 72 y.o. male  who presents to the emergency department today because of concern for chest pain and a fib. Patient states he has a history of atrial fibrillation. Noticed that his heart was beating fast yesterday morning, however after taking his morning medications his heart rate improved. This morning he again felt that his heart was racing. In addition it was accompanied by chest pain. This pain radiates to his back. It has been accompanied by some shortness of breath.     Physical Exam   Triage Vital Signs: ED Triage Vitals  Enc Vitals Group     BP 01/02/23 0854 (!) 167/105     Pulse Rate 01/02/23 0855 (!) 143     Resp 01/02/23 0854 (!) 22     Temp 01/02/23 0854 (!) 97.5 F (36.4 C)     Temp Source 01/02/23 0854 Oral     SpO2 01/02/23 0855 100 %     Weight 01/02/23 0857 190 lb (86.2 kg)     Height 01/02/23 0857 '5\' 6"'$  (1.676 m)     Head Circumference --      Peak Flow --      Pain Score 01/02/23 0856 6     Pain Loc --      Pain Edu? --      Excl. in [place]? --     Most recent vital signs: Vitals:   01/02/23 0855 01/02/23 0900  BP:  (!) 142/99  Pulse: (!) 143 (!) 147  Resp: (!) 21 (!) 21  Temp:    SpO2: 100% 100%   General: Awake, alert, oriented. CV:  Good peripheral perfusion. Tachycardia. Resp:  Normal effort. Lungs clear. Abd:  No distention.    ED Results / Procedures / Treatments   Labs (all labs ordered are listed, but only abnormal results are displayed) Labs Reviewed  CBC - Abnormal; Notable for the following components:      Result Value   WBC 10.9 (*)    Hemoglobin 12.8 (*)    RDW 18.1 (*)    All other components within normal limits  RESP PANEL BY RT-PCR (RSV, FLU A&B, COVID)  [id number]  BASIC METABOLIC PANEL  TROPONIN I (HIGH  SENSITIVITY)     EKG  I, Nance Pear, attending physician, personally viewed and interpreted this EKG  EKG Time: 0853 Rate: 144 Rhythm: narrow complex tachycardia Axis: normal Intervals: qtc 438 QRS: narrow, q waves v1 ST changes: no st elevation Impression: abnormal ekg  I, Nance Pear, attending physician, personally viewed and interpreted this EKG  EKG Time: 0939 Rate: 76 Rhythm: sinus rhythm Axis: normal Intervals: qtc 445 QRS: narrow, q waves v1 ST changes: no st elevation Impression: abnormal ekg  RADIOLOGY CXR Radiology interpretation:  IMPRESSION:  Again identified emphysematous changes with chronic interstitial  prominence of the mid to lower lungs.    No acute abnormalities.      PROCEDURES:  Critical Care performed: Yes, see critical care procedure note(s)  Procedures  CRITICAL CARE Performed by: Nance Pear   Total critical care time: 30 minutes  Critical care time was exclusive of separately billable procedures and treating other patients.  Critical care was necessary to treat or prevent imminent or life-threatening deterioration.  Critical  care was time spent personally by me on the following activities: development of treatment plan with patient and/or surrogate as well as nursing, discussions with consultants, evaluation of patient's response to treatment, examination of patient, obtaining history from patient or surrogate, ordering and performing treatments and interventions, ordering and review of laboratory studies, ordering and review of radiographic studies, pulse oximetry and re-evaluation of patient's condition.   MEDICATIONS ORDERED IN ED: Medications  albuterol (PROVENTIL) (2.5 MG/3ML) 0.083% nebulizer solution 2.5 mg (has no administration in time range)     IMPRESSION / MDM / ASSESSMENT AND PLAN / ED COURSE  I reviewed the triage vital signs and the nursing notes.                              Differential  diagnosis includes, but is not limited to, SVT, afib with RVR, ACS  Patient's presentation is most consistent with acute presentation with potential threat to life or bodily function.  The patient is on the cardiac monitor to evaluate for evidence of arrhythmia and/or significant heart rate changes.  Patient presented to the emergency department today because of concern for chest pain and fast heart rate. Initial EKG is consistent with afib with RVR. Prior to the patient receiving the diltiazem the patient converted back to sinus rhythm. Initial troponin was negative. Repeat however with elevation. Patient chest pain did improve after converting back to sinus rhythm, however he continued to have slight discomfort. Given increase in troponin do feel patient would benefit from further work up and management. Discussed with Dr. Charleen Kirks with the hospitalist service who will plan on admission.     FINAL CLINICAL IMPRESSION(S) / ED DIAGNOSES   Final diagnoses:  Elevated troponin  Atrial fibrillation with RVR ([place])     Note:  This document was prepared using Dragon voice recognition software and may include unintentional dictation errors.    Nance Pear, MD 01/02/23 [phone]

## 2023-01-02 NOTE — ED Notes (Signed)
EKG to EDP Darius Norris in person.

## 2023-01-02 NOTE — ED Notes (Signed)
Pt verbal hold on albuterol prn for now. Pt states did use albuterol once at home today around 5am.

## 2023-01-02 NOTE — Assessment & Plan Note (Addendum)
Restart Lasix as outpatient.  Continue Toprol

## 2023-01-03 ENCOUNTER — Encounter: Payer: Self-pay | Admitting: Internal Medicine

## 2023-01-03 ENCOUNTER — Other Ambulatory Visit (HOSPITAL_COMMUNITY): Payer: Self-pay

## 2023-01-03 ENCOUNTER — Encounter
Admission: EM | Disposition: A | Discharge status: Home / Self Care | Payer: Self-pay | Source: Home / Self Care | Attending: Internal Medicine

## 2023-01-03 DIAGNOSIS — I5032 Chronic diastolic (congestive) heart failure: Secondary | ICD-10-CM | POA: Diagnosis present

## 2023-01-03 DIAGNOSIS — Z881 Allergy status to other antibiotic agents status: Secondary | ICD-10-CM | POA: Diagnosis not present

## 2023-01-03 DIAGNOSIS — I4891 Unspecified atrial fibrillation: Secondary | ICD-10-CM | POA: Diagnosis not present

## 2023-01-03 DIAGNOSIS — I25110 Atherosclerotic heart disease of native coronary artery with unstable angina pectoris: Secondary | ICD-10-CM | POA: Diagnosis present

## 2023-01-03 DIAGNOSIS — I5A Non-ischemic myocardial injury (non-traumatic): Secondary | ICD-10-CM | POA: Diagnosis present

## 2023-01-03 DIAGNOSIS — I110 Hypertensive heart disease with heart failure: Secondary | ICD-10-CM | POA: Diagnosis present

## 2023-01-03 DIAGNOSIS — J439 Emphysema, unspecified: Secondary | ICD-10-CM

## 2023-01-03 DIAGNOSIS — Z9981 Dependence on supplemental oxygen: Secondary | ICD-10-CM | POA: Diagnosis not present

## 2023-01-03 DIAGNOSIS — Z7982 Long term (current) use of aspirin: Secondary | ICD-10-CM | POA: Diagnosis not present

## 2023-01-03 DIAGNOSIS — R0602 Shortness of breath: Secondary | ICD-10-CM | POA: Diagnosis present

## 2023-01-03 DIAGNOSIS — Z885 Allergy status to narcotic agent status: Secondary | ICD-10-CM | POA: Diagnosis not present

## 2023-01-03 DIAGNOSIS — E785 Hyperlipidemia, unspecified: Secondary | ICD-10-CM

## 2023-01-03 DIAGNOSIS — E669 Obesity, unspecified: Secondary | ICD-10-CM | POA: Diagnosis present

## 2023-01-03 DIAGNOSIS — Z7984 Long term (current) use of oral hypoglycemic drugs: Secondary | ICD-10-CM | POA: Diagnosis not present

## 2023-01-03 DIAGNOSIS — I480 Paroxysmal atrial fibrillation: Secondary | ICD-10-CM | POA: Diagnosis present

## 2023-01-03 DIAGNOSIS — Z1152 Encounter for screening for COVID-19: Secondary | ICD-10-CM | POA: Diagnosis not present

## 2023-01-03 DIAGNOSIS — Z823 Family history of stroke: Secondary | ICD-10-CM | POA: Diagnosis not present

## 2023-01-03 DIAGNOSIS — E1169 Type 2 diabetes mellitus with other specified complication: Secondary | ICD-10-CM

## 2023-01-03 DIAGNOSIS — J9611 Chronic respiratory failure with hypoxia: Secondary | ICD-10-CM | POA: Diagnosis present

## 2023-01-03 DIAGNOSIS — Z7901 Long term (current) use of anticoagulants: Secondary | ICD-10-CM | POA: Diagnosis not present

## 2023-01-03 DIAGNOSIS — K219 Gastro-esophageal reflux disease without esophagitis: Secondary | ICD-10-CM | POA: Diagnosis present

## 2023-01-03 DIAGNOSIS — I252 Old myocardial infarction: Secondary | ICD-10-CM | POA: Diagnosis not present

## 2023-01-03 DIAGNOSIS — Z888 Allergy status to other drugs, medicaments and biological substances status: Secondary | ICD-10-CM | POA: Diagnosis not present

## 2023-01-03 DIAGNOSIS — E876 Hypokalemia: Secondary | ICD-10-CM | POA: Diagnosis present

## 2023-01-03 DIAGNOSIS — Z87891 Personal history of nicotine dependence: Secondary | ICD-10-CM | POA: Diagnosis not present

## 2023-01-03 DIAGNOSIS — Z8249 Family history of ischemic heart disease and other diseases of the circulatory system: Secondary | ICD-10-CM | POA: Diagnosis not present

## 2023-01-03 HISTORY — PX: LEFT HEART CATH AND CORONARY ANGIOGRAPHY: CATH118249

## 2023-01-03 LAB — CBG MONITORING, ED: Glucose-Capillary: 131 mg/dL — ABNORMAL HIGH (ref 70–99)

## 2023-01-03 LAB — CBC
HCT: 36.3 % — ABNORMAL LOW (ref 39.0–52.0)
Hemoglobin: 11.5 g/dL — ABNORMAL LOW (ref 13.0–17.0)
MCH: 29.8 pg (ref 26.0–34.0)
MCHC: 31.7 g/dL (ref 30.0–36.0)
MCV: 94 fL (ref 80.0–100.0)
Platelets: 212 10*3/uL (ref 150–400)
RBC: 3.86 MIL/uL — ABNORMAL LOW (ref 4.22–5.81)
RDW: 18 % — ABNORMAL HIGH (ref 11.5–15.5)
WBC: 7.2 10*3/uL (ref 4.0–10.5)
nRBC: 0 % (ref 0.0–0.2)

## 2023-01-03 LAB — BASIC METABOLIC PANEL
Anion gap: 6 (ref 5–15)
BUN: 10 mg/dL (ref 8–23)
CO2: 27 mmol/L (ref 22–32)
Calcium: 8.7 mg/dL — ABNORMAL LOW (ref 8.9–10.3)
Chloride: 105 mmol/L (ref 98–111)
Creatinine, Ser: 0.75 mg/dL (ref 0.61–1.24)
GFR, Estimated: >60 mL/min (ref >60)
Glucose, Bld: 109 mg/dL — ABNORMAL HIGH (ref 70–99)
Potassium: 4.2 mmol/L (ref 3.5–5.1)
Sodium: 138 mmol/L (ref 135–145)

## 2023-01-03 LAB — GLUCOSE, CAPILLARY
Glucose-Capillary: 92 mg/dL (ref 70–99)
Glucose-Capillary: 86 mg/dL (ref 70–99)
Glucose-Capillary: 117 mg/dL — ABNORMAL HIGH (ref 70–99)

## 2023-01-03 LAB — LIPID PANEL
Cholesterol: 98 mg/dL (ref 0–200)
HDL: 29 mg/dL — ABNORMAL LOW (ref >40)
LDL Cholesterol: 53 mg/dL (ref 0–99)
Total CHOL/HDL Ratio: 3.4 RATIO
Triglycerides: 81 mg/dL (ref <150)
VLDL: 16 mg/dL (ref 0–40)

## 2023-01-03 LAB — HEPARIN LEVEL (UNFRACTIONATED): Heparin Unfractionated: 0.38 IU/mL (ref 0.30–0.70)

## 2023-01-03 SURGERY — LEFT HEART CATH AND CORONARY ANGIOGRAPHY
Anesthesia: Moderate Sedation

## 2023-01-03 MED ORDER — SODIUM CHLORIDE 0.9% FLUSH
3.0000 mL | Freq: Two times a day (BID) | INTRAVENOUS | Status: DC
  Administered 2023-01-03 (×2): 3 mL via INTRAVENOUS

## 2023-01-03 MED ORDER — HEPARIN SODIUM (PORCINE) 1000 UNIT/ML IJ SOLN
INTRAMUSCULAR | Status: DC | PRN
  Administered 2023-01-03: 4500 [IU] via INTRAVENOUS

## 2023-01-03 MED ORDER — SODIUM CHLORIDE 0.9% FLUSH: 3.0000 mL | INTRAVENOUS | Status: DC | PRN

## 2023-01-03 MED ORDER — ASPIRIN 81 MG PO CHEW
81.0000 mg | CHEWABLE_TABLET | Freq: Every day | ORAL | Status: DC
  Administered 2023-01-04: 81 mg via ORAL
  Filled 2023-01-03: qty 1

## 2023-01-03 MED ORDER — IOHEXOL 300 MG/ML  SOLN
INTRAMUSCULAR | Status: DC | PRN
  Administered 2023-01-03: 112 mL

## 2023-01-03 MED ORDER — DABIGATRAN ETEXILATE MESYLATE 150 MG PO CAPS: 150.0000 mg | ORAL_CAPSULE | Freq: Two times a day (BID) | ORAL | Status: DC

## 2023-01-03 MED ORDER — SODIUM CHLORIDE 0.9% FLUSH
3.0000 mL | Freq: Two times a day (BID) | INTRAVENOUS | Status: DC
  Administered 2023-01-04 (×2): 3 mL via INTRAVENOUS

## 2023-01-03 MED ORDER — FENTANYL CITRATE (PF) 100 MCG/2ML IJ SOLN
INTRAMUSCULAR | Status: AC
  Filled 2023-01-03: qty 2

## 2023-01-03 MED ORDER — LABETALOL HCL 5 MG/ML IV SOLN: 10.0000 mg | INTRAVENOUS | Status: AC | PRN

## 2023-01-03 MED ORDER — SODIUM CHLORIDE 0.9 % WEIGHT BASED INFUSION: 1.0000 mL/kg/h | INTRAVENOUS | Status: DC

## 2023-01-03 MED ORDER — ACETAMINOPHEN 325 MG PO TABS
ORAL_TABLET | ORAL | Status: AC
  Filled 2023-01-03: qty 2

## 2023-01-03 MED ORDER — SODIUM CHLORIDE 0.9 % IV SOLN: 250.0000 mL | INTRAVENOUS | Status: DC | PRN

## 2023-01-03 MED ORDER — FENTANYL CITRATE (PF) 100 MCG/2ML IJ SOLN
INTRAMUSCULAR | Status: DC | PRN
  Administered 2023-01-03: 25 ug via INTRAVENOUS

## 2023-01-03 MED ORDER — HEPARIN SODIUM (PORCINE) 1000 UNIT/ML IJ SOLN
INTRAMUSCULAR | Status: AC
  Filled 2023-01-03: qty 10

## 2023-01-03 MED ORDER — HYDRALAZINE HCL 20 MG/ML IJ SOLN: 10.0000 mg | INTRAMUSCULAR | Status: AC | PRN

## 2023-01-03 MED ORDER — ASPIRIN 81 MG PO CHEW: 81.0000 mg | CHEWABLE_TABLET | ORAL | Status: DC

## 2023-01-03 MED ORDER — SODIUM CHLORIDE 0.9 % WEIGHT BASED INFUSION: 3.0000 mL/kg/h | INTRAVENOUS | Status: DC

## 2023-01-03 MED ORDER — VERAPAMIL HCL 2.5 MG/ML IV SOLN
INTRAVENOUS | Status: AC
  Filled 2023-01-03: qty 2

## 2023-01-03 MED ORDER — SODIUM CHLORIDE 0.9 % WEIGHT BASED INFUSION
1.0000 mL/kg/h | INTRAVENOUS | Status: DC
  Administered 2023-01-03: 1 mL/kg/h via INTRAVENOUS

## 2023-01-03 MED ORDER — MIDAZOLAM HCL 2 MG/2ML IJ SOLN
INTRAMUSCULAR | Status: DC | PRN
  Administered 2023-01-03: 1 mg via INTRAVENOUS

## 2023-01-03 MED ORDER — HEPARIN (PORCINE) IN NACL 1000-0.9 UT/500ML-% IV SOLN
INTRAVENOUS | Status: AC
  Filled 2023-01-03: qty 1000

## 2023-01-03 MED ORDER — HEPARIN (PORCINE) IN NACL 1000-0.9 UT/500ML-% IV SOLN
INTRAVENOUS | Status: DC | PRN
  Administered 2023-01-03: 1000 mL

## 2023-01-03 MED ORDER — KETOROLAC TROMETHAMINE 15 MG/ML IJ SOLN
15.0000 mg | Freq: Once | INTRAMUSCULAR | Status: AC
  Administered 2023-01-03: 15 mg via INTRAVENOUS
  Filled 2023-01-03: qty 1

## 2023-01-03 MED ORDER — VERAPAMIL HCL 2.5 MG/ML IV SOLN
INTRAVENOUS | Status: DC | PRN
  Administered 2023-01-03: 2.5 mg via INTRA_ARTERIAL

## 2023-01-03 MED ORDER — ACETAMINOPHEN 325 MG PO TABS
650.0000 mg | ORAL_TABLET | ORAL | Status: DC | PRN
  Administered 2023-01-03: 650 mg via ORAL

## 2023-01-03 MED ORDER — TRAZODONE HCL 50 MG PO TABS
50.0000 mg | ORAL_TABLET | Freq: Once | ORAL | Status: AC
  Administered 2023-01-04: 50 mg via ORAL
  Filled 2023-01-03: qty 1

## 2023-01-03 MED ORDER — DABIGATRAN ETEXILATE MESYLATE 150 MG PO CAPS
150.0000 mg | ORAL_CAPSULE | Freq: Two times a day (BID) | ORAL | Status: DC
  Administered 2023-01-03 – 2023-01-04 (×2): 150 mg via ORAL
  Filled 2023-01-03 (×2): qty 1

## 2023-01-03 MED ORDER — ONDANSETRON HCL 4 MG/2ML IJ SOLN: 4.0000 mg | Freq: Four times a day (QID) | INTRAMUSCULAR | Status: DC | PRN

## 2023-01-03 MED ORDER — MIDAZOLAM HCL 2 MG/2ML IJ SOLN
INTRAMUSCULAR | Status: AC
  Filled 2023-01-03: qty 2

## 2023-01-03 SURGICAL SUPPLY — 11 items
CATH 5FR JL3.5 JR4 ANG PIG MP (CATHETERS) IMPLANT
DEVICE RAD TR BAND REGULAR (VASCULAR PRODUCTS) IMPLANT
DRAPE BRACHIAL (DRAPES) IMPLANT
GLIDESHEATH SLEND SS 6F .021 (SHEATH) IMPLANT
GUIDEWIRE INQWIRE 1.5J.035X260 (WIRE) IMPLANT
INQWIRE 1.5J .035X260CM (WIRE) ×1
PACK CARDIAC CATH (CUSTOM PROCEDURE TRAY) ×1 IMPLANT
PROTECTION STATION PRESSURIZED (MISCELLANEOUS) ×1
SET ATX SIMPLICITY (MISCELLANEOUS) IMPLANT
STATION PROTECTION PRESSURIZED (MISCELLANEOUS) IMPLANT
WIRE GUIDERIGHT .035X150 (WIRE) IMPLANT

## 2023-01-03 NOTE — Assessment & Plan Note (Addendum)
Continue Lipitor.  Hold Glucophage after cardiac cath.  Last hemoglobin A1c 6.0.

## 2023-01-03 NOTE — Progress Notes (Signed)
[facility] Cardiology    SUBJECTIVE: Patient states he feels better has less chest pain and shortness of breath resting comfortably with oxygen.  Receiving inhalers no worsening symptoms   Vitals:   01/03/23 0400 01/03/23 0448 01/03/23 0500 01/03/23 0632  BP: (!) 131/53  (!) 147/68 (!) 149/71  Pulse: 62  73 74  Resp: '20  14 16  '$ Temp:  98 F (36.7 C)    TempSrc:  Oral    SpO2: 97%  100% 100%  Weight:      Height:         Intake/Output Summary (Last 24 hours) at 01/03/2023 0758 Last data filed at 01/03/2023 0631 Gross per 24 hour  Intake --  Output 1470 ml  Net -1470 ml      PHYSICAL EXAM  General: Well developed, well nourished, in no acute distress on supplemental oxygen HEENT:  Normocephalic and atramatic Neck:  No JVD.  Lungs: Clear bilaterally to auscultation and percussion. Heart: HRRR . Normal S1 and S2 without gallops or murmurs.  Abdomen: Bowel sounds are positive, abdomen soft and non-tender  Msk:  Back normal, normal gait. Normal strength and tone for age. Extremities: No clubbing, cyanosis or edema.   Neuro: Alert and oriented X 3. Psych:  Good affect, responds appropriately   LABS: Basic Metabolic Panel: Recent Labs    01/02/23 0900 01/03/23 0449  NA 138 138  K 3.2* 4.2  CL 101 105  CO2 28 27  GLUCOSE 129* 109*  BUN 11 10  CREATININE 0.83 0.75  CALCIUM 8.5* 8.7*   Liver Function Tests: No results for input(s): "AST", "ALT", "ALKPHOS", "BILITOT", "PROT", "ALBUMIN" in the last 72 hours. No results for input(s): "LIPASE", "AMYLASE" in the last 72 hours. CBC: Recent Labs    01/02/23 0900 01/03/23 0449  WBC 10.9* 7.2  HGB 12.8* 11.5*  HCT 40.3 36.3*  MCV 92.9 94.0  PLT 266 212   Cardiac Enzymes: No results for input(s): "CKTOTAL", "CKMB", "CKMBINDEX", "TROPONINI" in the last 72 hours. BNP: Invalid input(s): "POCBNP" D-Dimer: No results for input(s): "DDIMER" in the last 72 hours. Hemoglobin A1C: Recent Labs    01/02/23 0859  HGBA1C 6.0*    Fasting Lipid Panel: Recent Labs    01/03/23 0449  CHOL 98  HDL 29*  LDLCALC 53  TRIG 81  CHOLHDL 3.4   Thyroid Function Tests: No results for input(s): "TSH", "T4TOTAL", "T3FREE", "THYROIDAB" in the last 72 hours.  Invalid input(s): "FREET3" Anemia Panel: No results for input(s): "VITAMINB12", "FOLATE", "FERRITIN", "TIBC", "IRON", "RETICCTPCT" in the last 72 hours.  ECHOCARDIOGRAM COMPLETE  Result Date: 01/02/2023    ECHOCARDIOGRAM REPORT   Patient Name:   Darius Norris Date of Exam: 01/02/2023 Medical Rec #:  [id number]       Height:       66.0 in Accession #:    [id number]      Weight:       190.0 lb Date of Birth:  [birth year]       BSA:          1.957 m Patient Age:    72 years        BP:           167/105 mmHg Patient Gender: M               HR:           78 bpm. Exam Location:  [facility] Procedure: 2D Echo and Intracardiac Opacification Agent Indications:  NSTEMI  History:         Patient has prior history of Echocardiogram examinations, most                  recent 05/16/2022. CHF, COPD; Risk Factors:Hypertension,                  Diabetes and Dyslipidemia.  Sonographer:     Harvie Junior Referring Phys:  [id number] Jose Persia Diagnosing Phys: Yolonda Kida MD  Sonographer Comments: Technically difficult study due to poor echo windows. Image acquisition challenging due to COPD. IMPRESSIONS  1. Left ventricular ejection fraction, by estimation, is 55 to 60%. The left ventricle has normal function. The left ventricle has no regional wall motion abnormalities. The left ventricular internal cavity size was moderately to severely dilated. Left ventricular diastolic parameters are consistent with Grade I diastolic dysfunction (impaired relaxation).  2. Right ventricular systolic function is low normal. The right ventricular size is moderately enlarged. Mildly increased right ventricular wall thickness.  3. Right atrial size was mildly dilated.  4. The mitral valve is normal in structure.  Trivial mitral valve regurgitation.  5. The aortic valve is normal in structure. Aortic valve regurgitation is not visualized. FINDINGS  Left Ventricle: Left ventricular ejection fraction, by estimation, is 55 to 60%. The left ventricle has normal function. The left ventricle has no regional wall motion abnormalities. Definity contrast agent was given IV to delineate the left ventricular  endocardial borders. The left ventricular internal cavity size was moderately to severely dilated. There is borderline concentric left ventricular hypertrophy. Left ventricular diastolic parameters are consistent with Grade I diastolic dysfunction (impaired relaxation). Right Ventricle: The right ventricular size is moderately enlarged. Mildly increased right ventricular wall thickness. Right ventricular systolic function is low normal. Left Atrium: Left atrial size was normal in size. Right Atrium: Right atrial size was mildly dilated. Pericardium: There is no evidence of pericardial effusion. Mitral Valve: The mitral valve is normal in structure. Trivial mitral valve regurgitation. Tricuspid Valve: The tricuspid valve is normal in structure. Tricuspid valve regurgitation is not demonstrated. Aortic Valve: The aortic valve is normal in structure. Aortic valve regurgitation is not visualized. Aortic valve mean gradient measures 3.0 mmHg. Aortic valve peak gradient measures 5.9 mmHg. Aortic valve area, by VTI measures 2.51 cm. Pulmonic Valve: The pulmonic valve was normal in structure. Pulmonic valve regurgitation is not visualized. Aorta: The ascending aorta was not well visualized. IAS/Shunts: No atrial level shunt detected by color flow Doppler.  LEFT VENTRICLE PLAX 2D LVIDd:         5.80 cm   Diastology LVIDs:         3.90 cm   LV e' medial:    6.20 cm/s LV PW:         1.00 cm   LV E/e' medial:  5.4 LV IVS:        1.10 cm   LV e' lateral:   6.74 cm/s LVOT diam:     1.90 cm   LV E/e' lateral: 5.0 LV SV:         60 LV SV Index:    31 LVOT Area:     2.84 cm  RIGHT VENTRICLE RV Basal diam:  3.60 cm RV Mid diam:    3.40 cm RV S prime:     18.00 cm/s TAPSE (M-mode): 1.9 cm LEFT ATRIUM           Index        RIGHT ATRIUM  Index LA diam:      3.10 cm 1.58 cm/m   RA Area:     12.00 cm LA Vol (A4C): 26.8 ml 13.70 ml/m  RA Volume:   26.10 ml  13.34 ml/m  AORTIC VALVE                    PULMONIC VALVE AV Area (Vmax):    2.37 cm     PV Vmax:       0.92 m/s AV Area (Vmean):   2.32 cm     PV Peak grad:  3.4 mmHg AV Area (VTI):     2.51 cm AV Vmax:           121.00 cm/s AV Vmean:          80.800 cm/s AV VTI:            0.238 m AV Peak Grad:      5.9 mmHg AV Mean Grad:      3.0 mmHg LVOT Vmax:         101.00 cm/s LVOT Vmean:        66.000 cm/s LVOT VTI:          0.211 m LVOT/AV VTI ratio: 0.89  AORTA Ao Root diam: 4.20 cm MITRAL VALVE MV Area (PHT): 3.08 cm    SHUNTS MV Decel Time: 246 msec    Systemic VTI:  0.21 m MR Peak grad: 17.6 mmHg    Systemic Diam: 1.90 cm MR Vmax:      210.00 cm/s MV E velocity: 33.50 cm/s MV A velocity: 79.10 cm/s MV E/A ratio:  0.42 Shequila Neglia D [address] MD Electronically signed by Yolonda Kida MD Signature Date/Time: 01/02/2023/5:53:04 PM    Final    DG Chest 2 View  Result Date: 01/02/2023 CLINICAL DATA:  Shortness of breath, chest pain, atrial flutter with rapid heart rate and elevated blood pressure. History atrial fibrillation, MI, COPD EXAM: CHEST - 2 VIEW COMPARISON:  09/14/2022 FINDINGS: Normal heart size and pulmonary vascularity. Atherosclerotic calcification aorta. Prominent fat pad at RIGHT cardiophrenic angle unchanged. Emphysematous changes with interstitial prominence of the mid to lower lungs bilaterally similar to previous study. No definite acute infiltrate, pleural effusion, or pneumothorax. Osseous structures unremarkable. IMPRESSION: Again identified emphysematous changes with chronic interstitial prominence of the mid to lower lungs. No acute abnormalities. Electronically Signed   By:  Lavonia Dana M.D.   On: 01/02/2023 09:46     Echo preserved ventricular function EF of at least 55%  TELEMETRY: Normal sinus rhythm.  12:  ASSESSMENT AND PLAN:  Principal Problem:   NSTEMI (non-ST elevated myocardial infarction) ([place]) Active Problems:   HTN (hypertension)   Diabetes mellitus without complication (HCC)   Atrial fibrillation with RVR (HCC)   COPD (chronic obstructive pulmonary disease) (HCC)   Chronic diastolic CHF (congestive heart failure) ([place])    Plan Possible non-STEMI peak troponin of 290 currently on IV heparin known coronary disease with chest pain recommend cardiac cath prior to discharge Atrial fibrillation rapid ventricular response rate much better controlled now holding Pradaxa in favor of heparin preop for cardiac cath continue rate control Hypertension reasonably controlled continue current medical therapy COPD moderate recommend inhalers supplemental oxygen Chronic diastolic congestive heart failure reasonably stable Proceed with cardiac cath   Yolonda Kida, MD 01/03/2023 7:58 AM

## 2023-01-03 NOTE — Progress Notes (Signed)
CROSS COVER NOTE  NAME: Darius Norris MRN: [id number] DOB : 1951/02/12    HPI/Events of Note   Nurse reports patient complain of inability to sleep and anxiousness despite getting melatonin for sleep  Assessment and  Interventions   Assessment:  Plan: One time dose of trazodone ordered     Kathlene Cote NP [facility]

## 2023-01-03 NOTE — Assessment & Plan Note (Signed)
BMI 30.67

## 2023-01-03 NOTE — Progress Notes (Signed)
Progress Note   Patient: Darius Norris EXN:[id number] DOB: [birth year] DOA: 01/02/2023     0 DOS: the patient was seen and examined on 01/03/2023   Brief hospital course: 72 y.o. male with medical history significant of CAD s/p NSTEMI, atrial fibrillation on Pradaxa, COPD complicated by chronic hypoxic respiratory failure on 3 L of O2, hypertension, hyperlipidemia, who presents to the ED due to chest pain.   Darius Norris states that yesterday, Darius Norris noticed that Darius Norris was not feeling well and feeling rundown, Darius Norris checked his heart rate at the time and it was elevated in the 130s to 140s.  Darius Norris took his home metoprolol and diltiazem and his heart rate improved.  Today, Darius Norris woke up again feeling malaised.  In addition, Darius Norris was experiencing central chest pain that radiated through to his back.  Darius Norris has never experienced similar pain suggest this when Darius Norris has had high heart rates in the past.  Due to this, Darius Norris called EMS.  Darius Norris denies any recent illnesses.  Darius Norris denies any missed doses of his medication other than this morning.  Darius Norris denies any fever, chills, new shortness of breath, no cough.  Darius Norris denies any diaphoresis or dizziness.  1/17.  Cardiac cath showed second marginal 100% stenosis, mid RCA 40% stenosis, ramus 50% stenosis EF 50 to 55%.  Cardiology recommends aggressive medical management.  Assessment and Plan: * Atrial fibrillation with RVR (HCC) Continue Cardizem and metoprolol for rate control.  Restart Pradaxa for anticoagulation.  Myocardial injury This is demand ischemia from rapid heart rate.  Cardiology ruled out NSTEMI with cardiac cath today.  Chronic one-vessel CAD.  Chronic diastolic CHF (congestive heart failure) (HCC) Holding Lasix today with cardiac catheterization.  Check creatinine tomorrow.  Continue Toprol.  Type 2 diabetes mellitus with hyperlipidemia (HCC) Continue Lipitor.  Hold Glucophage.  Sliding scale insulin.  HTN (hypertension) - Continue home antihypertensives  COPD (chronic  obstructive pulmonary disease) (HCC) Respiratory status stable.  Chronic hypoxic respiratory failure (HCC) Continue baseline oxygen supplementation  Obesity (BMI 30-39.9) BMI 30.67        Subjective: Patient feeling okay when I saw him this morning before cardiac catheterization.  Darius Norris stated Darius Norris did have 1 twinge in his chest that was sharp and lasted a few seconds.  Darius Norris states that Darius Norris went into atrial fibrillation on Monday when Darius Norris woke up And took his medications.  The next day Darius Norris also was in A-fib and had extensive chest pain and back pain.  His back pain is better and his chest pain is mostly improved.  Physical Exam: Vitals:   01/03/23 1500 01/03/23 1532 01/03/23 1602 01/03/23 1627  BP: 127/73 128/65 128/80 128/65  Pulse: (!) 58 (!) 57  (!) 59  Resp: (!) 21 (!) 25 (!) 23 19  Temp:      TempSrc:      SpO2: 97% 97%  97%  Weight:      Height:       Physical Exam HENT:     Head: Normocephalic.     Mouth/Throat:     Pharynx: No oropharyngeal exudate.  Eyes:     General: Lids are normal.     Conjunctiva/sclera: Conjunctivae normal.  Cardiovascular:     Rate and Rhythm: Normal rate and regular rhythm.     Heart sounds: Normal heart sounds, S1 normal and S2 normal.  Pulmonary:     Breath sounds: No decreased breath sounds, wheezing, rhonchi or rales.  Abdominal:     Palpations: Abdomen is  soft.     Tenderness: There is no abdominal tenderness.  Musculoskeletal:     Right lower leg: Swelling present.     Left lower leg: Swelling present.  Skin:    General: Skin is warm.     Findings: No rash.  Neurological:     Mental Status: Darius Norris is alert and oriented to person, place, and time.     Data Reviewed: Creatinine 0.75, LDL 53, hemoglobin 11.5, BNP 104, troponin peaked at 294  Family Communication: Spoke with wife at the bedside  Disposition: Status is: Inpatient Remains inpatient appropriate because: Cardiology recommending monitoring overnight and sending home  tomorrow morning  Planned Discharge Destination: Home    Time spent: 28 minutes  Author: Loletha Grayer, MD 01/03/2023 5:07 PM  For on call review [email or web address].

## 2023-01-03 NOTE — TOC Benefit Eligibility Note (Signed)
Patient Teacher, English as a foreign language completed.    The patient is currently admitted and upon discharge could be taking [place].  The current 30 day co-pay is $11.20.     Patient Teacher, English as a foreign language completed.    The patient is currently admitted and upon discharge could be taking [place].  The current 30 day co-pay is $11.20.   The patient is insured through [employer]

## 2023-01-03 NOTE — Assessment & Plan Note (Signed)
Continue baseline oxygen supplementation

## 2023-01-03 NOTE — Consult Note (Signed)
[place] for IV Heparin Indication: chest pain/ACS  Patient Measurements: Height: '5\' 6"'$  (167.6 cm) Weight: 86.2 kg (190 lb) IBW/kg (Calculated) : 63.8 Heparin Dosing Weight: 81.7 kg  Labs: Recent Labs    01/02/23 0900 01/02/23 1136 01/02/23 1337 01/02/23 1520 01/02/23 1826 01/02/23 2150 01/03/23 0449 01/03/23 0654  HGB 12.8*  --   --   --   --   --  11.5*  --   HCT 40.3  --   --   --   --   --  36.3*  --   PLT 266  --   --   --   --   --  212  --   APTT  --   --  35  --   --   --   --   --   LABPROT  --   --  15.5*  --   --   --   --   --   INR  --   --  1.2  --   --   --   --   --   HEPARINUNFRC  --   --   --   --   --  <0.10*  --  0.38  CREATININE 0.83  --   --   --   --   --  0.75  --   TROPONINIHS 14 221*  --  294* 256*  --   --   --      Estimated Creatinine Clearance: 87.2 mL/min (by C-G formula based on SCr of 0.75 mg/dL).   Medical History: Past Medical History:  Diagnosis Date   A-fib ([place])    CAD (coronary artery disease)    Colitis    COPD (chronic obstructive pulmonary disease) (HCC)    Emphysema lung (HCC)    GERD (gastroesophageal reflux disease)    Hypertension    MI (myocardial infarction) ([place]) 1999    Medications:  Pradaxa 150 mg BID prior to admission. Last patient reported dose was 01/01/23  Assessment: 72 y/o M with medical history including Afib on Pradaxa, CAD / MI who presented to the ED 1/16 with SOB and chest pain. Troponin rising, 14 >> 221. Pharmacy consulted to initiate and manage heparin infusion for suspected ACS.   Goal of Therapy:  Heparin level 0.3-0.7 units/ml Monitor platelets by anticoagulation protocol: Yes   Plan:  1/17:  HL @ 0654 = 0.38, therapeutic x 1 -Continue heparin drip at 1250 units/hr. -Recheck heparin level in 8 hours to confirm -Daily CBC per protocol while on IV heparin  Lorin Picket, PharmD 01/03/2023,9:02 AM

## 2023-01-03 NOTE — Hospital Course (Addendum)
72 y.o. male with medical history significant of CAD s/p NSTEMI, atrial fibrillation on Pradaxa, COPD complicated by chronic hypoxic respiratory failure on 3 L of O2, hypertension, hyperlipidemia, who presents to the ED due to chest pain.   Darius Norris that yesterday, he noticed that he was not feeling well and feeling rundown, he checked his heart rate at the time and it was elevated in the 130s to 140s.  He took his home metoprolol and diltiazem and his heart rate improved.  Today, he woke up again feeling malaised.  In addition, he was experiencing central chest pain that radiated through to his back.  He has never experienced similar pain suggest this when he has had high heart rates in the past.  Due to this, he called EMS.  He denies any recent illnesses.  He denies any missed doses of his medication other than this morning.  He denies any fever, chills, new shortness of breath, no cough.  He denies any diaphoresis or dizziness.  1/17.  Cardiac cath showed second marginal 100% stenosis, mid RCA 40% stenosis, ramus 50% stenosis EF 50 to 55%.  Cardiology recommends aggressive medical management. 1/18.  Patient feeling well after cardiac cath.  Offers no complaints.  Has a lot of bruising around his right wrist at the site of cardiac cath but that happened right after the procedure.  No hematomas seen.

## 2023-01-03 NOTE — Assessment & Plan Note (Addendum)
This is demand ischemia from rapid heart rate.  Cardiology ruled out NSTEMI with cardiac cath yesterday.  Chronic one-vessel CAD.

## 2023-01-04 ENCOUNTER — Encounter: Payer: Self-pay | Admitting: Cardiology

## 2023-01-04 DIAGNOSIS — E876 Hypokalemia: Secondary | ICD-10-CM

## 2023-01-04 DIAGNOSIS — E1169 Type 2 diabetes mellitus with other specified complication: Secondary | ICD-10-CM | POA: Diagnosis not present

## 2023-01-04 DIAGNOSIS — I5A Non-ischemic myocardial injury (non-traumatic): Secondary | ICD-10-CM | POA: Diagnosis not present

## 2023-01-04 DIAGNOSIS — I5032 Chronic diastolic (congestive) heart failure: Secondary | ICD-10-CM | POA: Diagnosis not present

## 2023-01-04 DIAGNOSIS — I4891 Unspecified atrial fibrillation: Secondary | ICD-10-CM | POA: Diagnosis not present

## 2023-01-04 LAB — BASIC METABOLIC PANEL
Anion gap: 9 (ref 5–15)
BUN: 10 mg/dL (ref 8–23)
CO2: 25 mmol/L (ref 22–32)
Calcium: 8.6 mg/dL — ABNORMAL LOW (ref 8.9–10.3)
Chloride: 105 mmol/L (ref 98–111)
Creatinine, Ser: 0.81 mg/dL (ref 0.61–1.24)
GFR, Estimated: >60 mL/min (ref >60)
Glucose, Bld: 108 mg/dL — ABNORMAL HIGH (ref 70–99)
Potassium: 4 mmol/L (ref 3.5–5.1)
Sodium: 139 mmol/L (ref 135–145)

## 2023-01-04 LAB — CBC
HCT: 35.4 % — ABNORMAL LOW (ref 39.0–52.0)
Hemoglobin: 11.2 g/dL — ABNORMAL LOW (ref 13.0–17.0)
MCH: 29.5 pg (ref 26.0–34.0)
MCHC: 31.6 g/dL (ref 30.0–36.0)
MCV: 93.2 fL (ref 80.0–100.0)
Platelets: 201 10*3/uL (ref 150–400)
RBC: 3.8 MIL/uL — ABNORMAL LOW (ref 4.22–5.81)
RDW: 17.9 % — ABNORMAL HIGH (ref 11.5–15.5)
WBC: 6.2 10*3/uL (ref 4.0–10.5)
nRBC: 0 % (ref 0.0–0.2)

## 2023-01-04 LAB — GLUCOSE, CAPILLARY: Glucose-Capillary: 104 mg/dL — ABNORMAL HIGH (ref 70–99)

## 2023-01-04 MED ORDER — DILTIAZEM HCL ER COATED BEADS 300 MG PO CP24: 300.0000 mg | ORAL_CAPSULE | Freq: Every day | ORAL | 0 refills | Status: DC

## 2023-01-04 MED ORDER — ATORVASTATIN CALCIUM 80 MG PO TABS: 40.0000 mg | ORAL_TABLET | Freq: Every day | ORAL | Status: AC

## 2023-01-04 NOTE — Progress Notes (Signed)
[facility] Cardiology    SUBJECTIVE: Patient states been able to be well no significant chest pain shortness of breath much improved significant palpitations tachycardia resolved stable on supplemental oxygen no lower extremity edema patient states she feels much improved   Vitals:   01/04/23 0739 01/04/23 0800 01/04/23 0808 01/04/23 0900  BP:      Pulse:    85  Resp: '19 18 16   '$ Temp:      TempSrc:      SpO2:    96%  Weight:      Height:         Intake/Output Summary (Last 24 hours) at 01/04/2023 1048 Last data filed at 01/04/2023 9604 Gross per 24 hour  Intake 893.12 ml  Output 1850 ml  Net -956.88 ml      PHYSICAL EXAM  General: Well developed, well nourished, in no acute distress HEENT:  Normocephalic and atramatic Neck:  No JVD.  Lungs: Clear bilaterally to auscultation and percussion. Heart: HRRR . Normal S1 and S2 without gallops or murmurs.  Abdomen: Bowel sounds are positive, abdomen soft and non-tender  Msk:  Back normal, normal gait. Normal strength and tone for age. Extremities: No clubbing, cyanosis or edema.   Neuro: Alert and oriented X 3. Psych:  Good affect, responds appropriately   LABS: Basic Metabolic Panel: Recent Labs    01/03/23 0449 01/04/23 0623  NA 138 139  K 4.2 4.0  CL 105 105  CO2 27 25  GLUCOSE 109* 108*  BUN 10 10  CREATININE 0.75 0.81  CALCIUM 8.7* 8.6*   Liver Function Tests: No results for input(s): "AST", "ALT", "ALKPHOS", "BILITOT", "PROT", "ALBUMIN" in the last 72 hours. No results for input(s): "LIPASE", "AMYLASE" in the last 72 hours. CBC: Recent Labs    01/03/23 0449 01/04/23 0623  WBC 7.2 6.2  HGB 11.5* 11.2*  HCT 36.3* 35.4*  MCV 94.0 93.2  PLT 212 201   Cardiac Enzymes: No results for input(s): "CKTOTAL", "CKMB", "CKMBINDEX", "TROPONINI" in the last 72 hours. BNP: Invalid input(s): "POCBNP" D-Dimer: No results for input(s): "DDIMER" in the last 72 hours. Hemoglobin A1C: Recent Labs    01/02/23 0859   HGBA1C 6.0*   Fasting Lipid Panel: Recent Labs    01/03/23 0449  CHOL 98  HDL 29*  LDLCALC 53  TRIG 81  CHOLHDL 3.4   Thyroid Function Tests: No results for input(s): "TSH", "T4TOTAL", "T3FREE", "THYROIDAB" in the last 72 hours.  Invalid input(s): "FREET3" Anemia Panel: No results for input(s): "VITAMINB12", "FOLATE", "FERRITIN", "TIBC", "IRON", "RETICCTPCT" in the last 72 hours.  CARDIAC CATHETERIZATION  Result Date: 01/03/2023   Mid RCA lesion is 40% stenosed.   2nd Mrg lesion is 100% stenosed.   Ramus lesion is 50% stenosed.   The left ventricular systolic function is normal.   LV end diastolic pressure is normal.   The left ventricular ejection fraction is 50-55% by visual estimate. 1.  One-vessel coronary artery disease with chronically occluded OM2 2.  Normal left ventricular function Recommendations 1.  Medical therapy 2.  Aggressive risk factor modification 3.  May discharge home later today or in a.m. 4.  Follow-up with me in 1 week   ECHOCARDIOGRAM COMPLETE  Result Date: 01/02/2023    ECHOCARDIOGRAM REPORT   Patient Name:   Darius Norris Date of Exam: 01/02/2023 Medical Rec #:  [id number]       Height:       66.0 in Accession #:    [id number]  Weight:       190.0 lb Date of Birth:  October 18, 1951       BSA:          1.957 m Patient Age:    72 years        BP:           167/105 mmHg Patient Gender: M               HR:           78 bpm. Exam Location:  [facility] Procedure: 2D Echo and Intracardiac Opacification Agent Indications:     NSTEMI  History:         Patient has prior history of Echocardiogram examinations, most                  recent 05/16/2022. CHF, COPD; Risk Factors:Hypertension,                  Diabetes and Dyslipidemia.  Sonographer:     Harvie Junior Referring Phys:  [id number] Jose Persia Diagnosing Phys: Yolonda Kida MD  Sonographer Comments: Technically difficult study due to poor echo windows. Image acquisition challenging due to COPD. IMPRESSIONS  1. Left  ventricular ejection fraction, by estimation, is 55 to 60%. The left ventricle has normal function. The left ventricle has no regional wall motion abnormalities. The left ventricular internal cavity size was moderately to severely dilated. Left ventricular diastolic parameters are consistent with Grade I diastolic dysfunction (impaired relaxation).  2. Right ventricular systolic function is low normal. The right ventricular size is moderately enlarged. Mildly increased right ventricular wall thickness.  3. Right atrial size was mildly dilated.  4. The mitral valve is normal in structure. Trivial mitral valve regurgitation.  5. The aortic valve is normal in structure. Aortic valve regurgitation is not visualized. FINDINGS  Left Ventricle: Left ventricular ejection fraction, by estimation, is 55 to 60%. The left ventricle has normal function. The left ventricle has no regional wall motion abnormalities. Definity contrast agent was given IV to delineate the left ventricular  endocardial borders. The left ventricular internal cavity size was moderately to severely dilated. There is borderline concentric left ventricular hypertrophy. Left ventricular diastolic parameters are consistent with Grade I diastolic dysfunction (impaired relaxation). Right Ventricle: The right ventricular size is moderately enlarged. Mildly increased right ventricular wall thickness. Right ventricular systolic function is low normal. Left Atrium: Left atrial size was normal in size. Right Atrium: Right atrial size was mildly dilated. Pericardium: There is no evidence of pericardial effusion. Mitral Valve: The mitral valve is normal in structure. Trivial mitral valve regurgitation. Tricuspid Valve: The tricuspid valve is normal in structure. Tricuspid valve regurgitation is not demonstrated. Aortic Valve: The aortic valve is normal in structure. Aortic valve regurgitation is not visualized. Aortic valve mean gradient measures 3.0 mmHg. Aortic  valve peak gradient measures 5.9 mmHg. Aortic valve area, by VTI measures 2.51 cm. Pulmonic Valve: The pulmonic valve was normal in structure. Pulmonic valve regurgitation is not visualized. Aorta: The ascending aorta was not well visualized. IAS/Shunts: No atrial level shunt detected by color flow Doppler.  LEFT VENTRICLE PLAX 2D LVIDd:         5.80 cm   Diastology LVIDs:         3.90 cm   LV e' medial:    6.20 cm/s LV PW:         1.00 cm   LV E/e' medial:  5.4 LV IVS:  1.10 cm   LV e' lateral:   6.74 cm/s LVOT diam:     1.90 cm   LV E/e' lateral: 5.0 LV SV:         60 LV SV Index:   31 LVOT Area:     2.84 cm  RIGHT VENTRICLE RV Basal diam:  3.60 cm RV Mid diam:    3.40 cm RV S prime:     18.00 cm/s TAPSE (M-mode): 1.9 cm LEFT ATRIUM           Index        RIGHT ATRIUM           Index LA diam:      3.10 cm 1.58 cm/m   RA Area:     12.00 cm LA Vol (A4C): 26.8 ml 13.70 ml/m  RA Volume:   26.10 ml  13.34 ml/m  AORTIC VALVE                    PULMONIC VALVE AV Area (Vmax):    2.37 cm     PV Vmax:       0.92 m/s AV Area (Vmean):   2.32 cm     PV Peak grad:  3.4 mmHg AV Area (VTI):     2.51 cm AV Vmax:           121.00 cm/s AV Vmean:          80.800 cm/s AV VTI:            0.238 m AV Peak Grad:      5.9 mmHg AV Mean Grad:      3.0 mmHg LVOT Vmax:         101.00 cm/s LVOT Vmean:        66.000 cm/s LVOT VTI:          0.211 m LVOT/AV VTI ratio: 0.89  AORTA Ao Root diam: 4.20 cm MITRAL VALVE MV Area (PHT): 3.08 cm    SHUNTS MV Decel Time: 246 msec    Systemic VTI:  0.21 m MR Peak grad: 17.6 mmHg    Systemic Diam: 1.90 cm MR Vmax:      210.00 cm/s MV E velocity: 33.50 cm/s MV A velocity: 79.10 cm/s MV E/A ratio:  0.42 Lucretia Pendley D Christne Platts MD Electronically signed by Yolonda Kida MD Signature Date/Time: 01/02/2023/5:53:04 PM    Final      Echo preserved left ventricular function EF around 50 to 55%  TELEMETRY: Irregularly irregular rate of 80 nonspecific ST-T changes:  ASSESSMENT AND PLAN:  Principal  Problem:   Atrial fibrillation with RVR (HCC) Active Problems:   HTN (hypertension)   Chronic hypoxic respiratory failure (HCC)   COPD (chronic obstructive pulmonary disease) (HCC)   Chronic diastolic CHF (congestive heart failure) (HCC)   Myocardial injury   Type 2 diabetes mellitus with hyperlipidemia (HCC)   Obesity (BMI 30-39.9)     Plan Atrial fibrillation paroxysmal continue current therapy including rate control and anticoagulation Hypertension continue current management for blood pressure control Chronic hypoxic respiratory failure resolved continue supplemental oxygen therapy for hypoxemia COPD moderate to severe continue inhalers continue supplemental oxygen follow-up with pulmonary Chronic diastolic congestive heart failure reasonably compensated continue diuretic therapy blood pressure control and heart failure management Diabetes type 2 uncomplicated continue current management and care Hyperlipidemia recommend statin therapy for lipid management Obesity recommend weight loss exercise portion control Continue conservative management follow-up with cardiology as an outpatient   Aidel Davisson D Zoey Gilkeson,  MD 01/04/2023 10:48 AM

## 2023-01-04 NOTE — Assessment & Plan Note (Signed)
Potassium replaced during hospital course.

## 2023-01-04 NOTE — Discharge Summary (Signed)
Physician Discharge Summary   Patient: Darius Norris MRN: [id number] DOB: 12-Dec-1951  Admit date:     01/02/2023  Discharge date: 01/04/23  Discharge Physician: Loletha Grayer   PCP: [facility]   Recommendations at discharge:   Follow-up PCP 5 days Follow-up Dr. Saralyn Pilar 1 week  Discharge Diagnoses: Principal Problem:   Atrial fibrillation with RVR ([place]) Active Problems:   Myocardial injury   Chronic diastolic CHF (congestive heart failure) (HCC)   Type 2 diabetes mellitus with hyperlipidemia (HCC)   HTN (hypertension)   COPD (chronic obstructive pulmonary disease) (HCC)   Hypokalemia   Chronic hypoxic respiratory failure (HCC)   Obesity (BMI 30-39.9)   Hospital Course: 72 y.o. male with medical history significant of CAD s/p NSTEMI, atrial fibrillation on Pradaxa, COPD complicated by chronic hypoxic respiratory failure on 3 L of O2, hypertension, hyperlipidemia, who presents to the ED due to chest pain.   Darius Norris states that yesterday, he noticed that he was not feeling well and feeling rundown, he checked his heart rate at the time and it was elevated in the 130s to 140s.  He took his home metoprolol and diltiazem and his heart rate improved.  Today, he woke up again feeling malaised.  In addition, he was experiencing central chest pain that radiated through to his back.  He has never experienced similar pain suggest this when he has had high heart rates in the past.  Due to this, he called EMS.  He denies any recent illnesses.  He denies any missed doses of his medication other than this morning.  He denies any fever, chills, new shortness of breath, no cough.  He denies any diaphoresis or dizziness.  1/17.  Cardiac cath showed second marginal 100% stenosis, mid RCA 40% stenosis, ramus 50% stenosis EF 50 to 55%.  Cardiology recommends aggressive medical management. 1/18.  Patient feeling well after cardiac cath.  Offers no complaints.  Has a lot of bruising  around his right wrist at the site of cardiac cath but that happened right after the procedure.  No hematomas seen.  Assessment and Plan: * Atrial fibrillation with RVR (HCC) Paroxysmal in nature.  Patient in normal sinus rhythm.  Continue Cardizem and metoprolol for rate control.  Continue Pradaxa for anticoagulation.  Myocardial injury This is demand ischemia from rapid heart rate.  Cardiology ruled out NSTEMI with cardiac cath yesterday.  Chronic one-vessel CAD.  Chronic diastolic CHF (congestive heart failure) (HCC) Restart Lasix as outpatient.  Continue Toprol  Type 2 diabetes mellitus with hyperlipidemia (HCC) Continue Lipitor.  Hold Glucophage after cardiac cath.  Last hemoglobin A1c 6.0.  HTN (hypertension) - Continue home antihypertensives  COPD (chronic obstructive pulmonary disease) (HCC) Respiratory status stable.  Continue inhalers.  Chronic hypoxic respiratory failure (HCC) Continue baseline oxygen supplementation  Hypokalemia Potassium replaced during hospital course.  Obesity (BMI 30-39.9) BMI 30.67         Consultants: Cardiology Procedures performed: Cardiac cath Disposition: Home Diet recommendation:  Cardiac and Carb modified diet DISCHARGE MEDICATION: Allergies as of 01/04/2023       Reactions   Zantac [ranitidine Hcl] Shortness Of Breath   Eruption, HIVES, SHORTNESS OF BREATH, Eruption, HIVES, SHORTNESS OF BREATH   Molnupiravir Swelling   Codeine Itching   Other reaction(s): PRURITIS   Erythromycin Hives   Other reaction(s): SWELLING (NON-SPECIFIC), HIVES, SWELLING (NON-SPECIFIC), HIVES   Morphine Itching   Tiotropium Bromide Monohydrate    Hydrocodone Rash   Other reaction(s): PRURITIS  Medication List     STOP taking these medications    budesonide-formoterol 160-4.5 MCG/ACT inhaler Commonly known as: SYMBICORT   doxycycline 100 MG capsule Commonly known as: VIBRAMYCIN   guaifenesin 400 MG Tabs tablet Commonly known  as: HUMIBID E   hydrOXYzine 25 MG tablet Commonly known as: ATARAX   Ipratropium-Albuterol 20-100 MCG/ACT Aers respimat Commonly known as: COMBIVENT   ketorolac 0.5 % ophthalmic solution Commonly known as: ACULAR   metFORMIN 500 MG 24 hr tablet Commonly known as: GLUCOPHAGE-XR   moxifloxacin 0.5 % ophthalmic solution Commonly known as: VIGAMOX       TAKE these medications    acetaminophen 325 MG tablet Commonly known as: TYLENOL Take 650 mg by mouth every 6 (six) hours as needed.   albuterol 108 (90 Base) MCG/ACT inhaler Commonly known as: VENTOLIN HFA Inhale into the lungs.   aspirin EC 81 MG tablet Take 1 tablet (81 mg total) by mouth daily.   atorvastatin 80 MG tablet Commonly known as: LIPITOR Take 0.5 tablets (40 mg total) by mouth at bedtime. Half of an 80   busPIRone 10 MG tablet Commonly known as: BUSPAR Take by mouth.   clobetasol 0.05 % external solution Commonly known as: TEMOVATE Apply topically.   dabigatran 150 MG Caps capsule Commonly known as: PRADAXA Take 150 mg by mouth 2 (two) times daily.   diltiazem 300 MG 24 hr capsule Commonly known as: CARDIZEM CD Take 1 capsule (300 mg total) by mouth daily.   furosemide 40 MG tablet Commonly known as: LASIX Take 1 tablet (40 mg total) by mouth daily.   gabapentin 300 MG capsule Commonly known as: NEURONTIN Take 1 capsule by mouth 3 (three) times daily as needed.   hydrocortisone 2.5 % cream Apply topically.   isosorbide mononitrate 60 MG 24 hr tablet Commonly known as: IMDUR Take 60 mg by mouth daily.   lidocaine 5 % Commonly known as: LIDODERM Place 1 patch onto the skin daily. Remove & Discard patch within 12 hours or as directed by MD What changed: Another medication with the same name was removed. Continue taking this medication, and follow the directions you see here.   loratadine 10 MG tablet Commonly known as: CLARITIN Take 10 mg by mouth daily as needed for allergies.    Melatonin 3 MG Caps Take 2 capsules by mouth at bedtime.   methotrexate 2.5 MG tablet Commonly known as: RHEUMATREX Take 10 mg by mouth once a week.   metoprolol tartrate 100 MG tablet Commonly known as: LOPRESSOR Take 1 tablet (100 mg total) by mouth 2 (two) times daily.   mometasone 220 MCG/INH inhaler Commonly known as: ASMANEX Take by mouth.   omeprazole 20 MG capsule Commonly known as: PRILOSEC Take 20 mg by mouth daily.   senna 8.6 MG Tabs tablet Commonly known as: SENOKOT Take 1 tablet by mouth daily as needed for mild constipation.   simethicone 80 MG chewable tablet Commonly known as: MYLICON Chew 80 mg by mouth 4 (four) times daily as needed for flatulence.   Stiolto Respimat 2.5-2.5 MCG/ACT Aers Generic drug: Tiotropium Bromide-Olodaterol Inhale 2 each into the lungs 2 (two) times daily.        [place] Follow up in 5 day(s).   Specialty: General Practice Contact information: [address] [place] [address] [phone]         Isaias Cowman, MD. Go in 1 week(s).   Specialty: Cardiology Why: Appointment on  Thursday, 01/18/2023 at 2:00pm. Contact information: [facility] [place] [address] [phone]                Discharge Exam: Danley Danker Weights   01/02/23 0857  Weight: 86.2 kg   Physical Exam HENT:     Head: Normocephalic.     Mouth/Throat:     Pharynx: No oropharyngeal exudate.  Eyes:     General: Lids are normal.     Conjunctiva/sclera: Conjunctivae normal.  Cardiovascular:     Rate and Rhythm: Normal rate and regular rhythm.     Heart sounds: Normal heart sounds, S1 normal and S2 normal.  Pulmonary:     Breath sounds: No decreased breath sounds, wheezing, rhonchi or rales.  Abdominal:     Palpations: Abdomen is soft.     Tenderness: There is no abdominal tenderness.  Musculoskeletal:     Right lower leg: Swelling present.      Left lower leg: Swelling present.  Skin:    General: Skin is warm.     Findings: No rash.     Comments: Bruising bilateral arms and right wrist  Neurological:     Mental Status: He is alert and oriented to person, place, and time.      Condition at discharge: stable  The results of significant diagnostics from this hospitalization (including imaging, microbiology, ancillary and laboratory) are listed below for reference.   Imaging Studies: CARDIAC CATHETERIZATION  Result Date: 01/03/2023   Mid RCA lesion is 40% stenosed.   2nd Mrg lesion is 100% stenosed.   Ramus lesion is 50% stenosed.   The left ventricular systolic function is normal.   LV end diastolic pressure is normal.   The left ventricular ejection fraction is 50-55% by visual estimate. 1.  One-vessel coronary artery disease with chronically occluded OM2 2.  Normal left ventricular function Recommendations 1.  Medical therapy 2.  Aggressive risk factor modification 3.  May discharge home later today or in a.m. 4.  Follow-up with me in 1 week   ECHOCARDIOGRAM COMPLETE  Result Date: 01/02/2023    ECHOCARDIOGRAM REPORT   Patient Name:   Darius Norris Date of Exam: 01/02/2023 Medical Rec #:  [id number]       Height:       66.0 in Accession #:    [id number]      Weight:       190.0 lb Date of Birth:  Jun 17, 1951       BSA:          1.957 m Patient Age:    42 years        BP:           167/105 mmHg Patient Gender: M               HR:           78 bpm. Exam Location:  [facility] Procedure: 2D Echo and Intracardiac Opacification Agent Indications:     NSTEMI  History:         Patient has prior history of Echocardiogram examinations, most                  recent 05/16/2022. CHF, COPD; Risk Factors:Hypertension,                  Diabetes and Dyslipidemia.  Sonographer:     Harvie Junior Referring Phys:  [id number] Jose Persia Diagnosing Phys: Yolonda Kida MD  Sonographer Comments: Technically difficult  study due to poor echo windows. Image  acquisition challenging due to COPD. IMPRESSIONS  1. Left ventricular ejection fraction, by estimation, is 55 to 60%. The left ventricle has normal function. The left ventricle has no regional wall motion abnormalities. The left ventricular internal cavity size was moderately to severely dilated. Left ventricular diastolic parameters are consistent with Grade I diastolic dysfunction (impaired relaxation).  2. Right ventricular systolic function is low normal. The right ventricular size is moderately enlarged. Mildly increased right ventricular wall thickness.  3. Right atrial size was mildly dilated.  4. The mitral valve is normal in structure. Trivial mitral valve regurgitation.  5. The aortic valve is normal in structure. Aortic valve regurgitation is not visualized. FINDINGS  Left Ventricle: Left ventricular ejection fraction, by estimation, is 55 to 60%. The left ventricle has normal function. The left ventricle has no regional wall motion abnormalities. Definity contrast agent was given IV to delineate the left ventricular  endocardial borders. The left ventricular internal cavity size was moderately to severely dilated. There is borderline concentric left ventricular hypertrophy. Left ventricular diastolic parameters are consistent with Grade I diastolic dysfunction (impaired relaxation). Right Ventricle: The right ventricular size is moderately enlarged. Mildly increased right ventricular wall thickness. Right ventricular systolic function is low normal. Left Atrium: Left atrial size was normal in size. Right Atrium: Right atrial size was mildly dilated. Pericardium: There is no evidence of pericardial effusion. Mitral Valve: The mitral valve is normal in structure. Trivial mitral valve regurgitation. Tricuspid Valve: The tricuspid valve is normal in structure. Tricuspid valve regurgitation is not demonstrated. Aortic Valve: The aortic valve is normal in structure. Aortic valve regurgitation is not  visualized. Aortic valve mean gradient measures 3.0 mmHg. Aortic valve peak gradient measures 5.9 mmHg. Aortic valve area, by VTI measures 2.51 cm. Pulmonic Valve: The pulmonic valve was normal in structure. Pulmonic valve regurgitation is not visualized. Aorta: The ascending aorta was not well visualized. IAS/Shunts: No atrial level shunt detected by color flow Doppler.  LEFT VENTRICLE PLAX 2D LVIDd:         5.80 cm   Diastology LVIDs:         3.90 cm   LV e' medial:    6.20 cm/s LV PW:         1.00 cm   LV E/e' medial:  5.4 LV IVS:        1.10 cm   LV e' lateral:   6.74 cm/s LVOT diam:     1.90 cm   LV E/e' lateral: 5.0 LV SV:         60 LV SV Index:   31 LVOT Area:     2.84 cm  RIGHT VENTRICLE RV Basal diam:  3.60 cm RV Mid diam:    3.40 cm RV S prime:     18.00 cm/s TAPSE (M-mode): 1.9 cm LEFT ATRIUM           Index        RIGHT ATRIUM           Index LA diam:      3.10 cm 1.58 cm/m   RA Area:     12.00 cm LA Vol (A4C): 26.8 ml 13.70 ml/m  RA Volume:   26.10 ml  13.34 ml/m  AORTIC VALVE                    PULMONIC VALVE AV Area (Vmax):    2.37 cm     PV Vmax:  0.92 m/s AV Area (Vmean):   2.32 cm     PV Peak grad:  3.4 mmHg AV Area (VTI):     2.51 cm AV Vmax:           121.00 cm/s AV Vmean:          80.800 cm/s AV VTI:            0.238 m AV Peak Grad:      5.9 mmHg AV Mean Grad:      3.0 mmHg LVOT Vmax:         101.00 cm/s LVOT Vmean:        66.000 cm/s LVOT VTI:          0.211 m LVOT/AV VTI ratio: 0.89  AORTA Ao Root diam: 4.20 cm MITRAL VALVE MV Area (PHT): 3.08 cm    SHUNTS MV Decel Time: 246 msec    Systemic VTI:  0.21 m MR Peak grad: 17.6 mmHg    Systemic Diam: 1.90 cm MR Vmax:      210.00 cm/s MV E velocity: 33.50 cm/s MV A velocity: 79.10 cm/s MV E/A ratio:  0.42 Dwayne D Callwood MD Electronically signed by Yolonda Kida MD Signature Date/Time: 01/02/2023/5:53:04 PM    Final    DG Chest 2 View  Result Date: 01/02/2023 CLINICAL DATA:  Shortness of breath, chest pain, atrial flutter  with rapid heart rate and elevated blood pressure. History atrial fibrillation, MI, COPD EXAM: CHEST - 2 VIEW COMPARISON:  09/14/2022 FINDINGS: Normal heart size and pulmonary vascularity. Atherosclerotic calcification aorta. Prominent fat pad at RIGHT cardiophrenic angle unchanged. Emphysematous changes with interstitial prominence of the mid to lower lungs bilaterally similar to previous study. No definite acute infiltrate, pleural effusion, or pneumothorax. Osseous structures unremarkable. IMPRESSION: Again identified emphysematous changes with chronic interstitial prominence of the mid to lower lungs. No acute abnormalities. Electronically Signed   By: Lavonia Dana M.D.   On: 01/02/2023 09:46    Microbiology: Results for orders placed or performed during the hospital encounter of 01/02/23  Resp panel by RT-PCR (RSV, Flu A&B, Covid) Anterior Nasal Swab     Status: None   Collection Time: 01/02/23  8:59 AM   Specimen: Anterior Nasal Swab  Result Value Ref Range Status   SARS Coronavirus 2 by RT PCR NEGATIVE NEGATIVE Final    Comment: (NOTE) SARS-CoV-2 target nucleic acids are NOT DETECTED.  The SARS-CoV-2 RNA is generally detectable in upper respiratory specimens during the acute phase of infection. The lowest concentration of SARS-CoV-2 viral copies this assay can detect is 138 copies/mL. A negative result does not preclude SARS-Cov-2 infection and should not be used as the sole basis for treatment or other patient management decisions. A negative result may occur with  improper specimen collection/handling, submission of specimen other than nasopharyngeal swab, presence of viral mutation(s) within the areas targeted by this assay, and inadequate number of viral copies(<138 copies/mL). A negative result must be combined with clinical observations, patient history, and epidemiological information. The expected result is Negative.  Fact Sheet for Patients:   EntrepreneurPulse.com.au  Fact Sheet for Healthcare Providers:  IncredibleEmployment.be  This test is no t yet approved or cleared by the [place] FDA and  has been authorized for detection and/or diagnosis of SARS-CoV-2 by FDA under an Emergency Use Authorization (EUA). This EUA will remain  in effect (meaning this test can be used) for the duration of the COVID-19 declaration under Section 564(b)(1) of the Act, 21 U.S.C.section  360bbb-3(b)(1), unless the authorization is terminated  or revoked sooner.       Influenza A by PCR NEGATIVE NEGATIVE Final   Influenza B by PCR NEGATIVE NEGATIVE Final    Comment: (NOTE) The Xpert Xpress SARS-CoV-2/FLU/RSV plus assay is intended as an aid in the diagnosis of influenza from Nasopharyngeal swab specimens and should not be used as a sole basis for treatment. Nasal washings and aspirates are unacceptable for Xpert Xpress SARS-CoV-2/FLU/RSV testing.  Fact Sheet for Patients: EntrepreneurPulse.com.au  Fact Sheet for Healthcare Providers: IncredibleEmployment.be  This test is not yet approved or cleared by the [place] FDA and has been authorized for detection and/or diagnosis of SARS-CoV-2 by FDA under an Emergency Use Authorization (EUA). This EUA will remain in effect (meaning this test can be used) for the duration of the COVID-19 declaration under Section 564(b)(1) of the Act, 21 U.S.C. section 360bbb-3(b)(1), unless the authorization is terminated or revoked.     Resp Syncytial Virus by PCR NEGATIVE NEGATIVE Final    Comment: (NOTE) Fact Sheet for Patients: EntrepreneurPulse.com.au  Fact Sheet for Healthcare Providers: IncredibleEmployment.be  This test is not yet approved or cleared by the [place] FDA and has been authorized for detection and/or diagnosis of SARS-CoV-2 by FDA under an Emergency Use  Authorization (EUA). This EUA will remain in effect (meaning this test can be used) for the duration of the COVID-19 declaration under Section 564(b)(1) of the Act, 21 U.S.C. section 360bbb-3(b)(1), unless the authorization is terminated or revoked.  Performed at [facility], [address] [place], [place] [address]     Labs: CBC: Recent Labs  Lab 01/02/23 0900 01/03/23 0449 01/04/23 0623  WBC 10.9* 7.2 6.2  HGB 12.8* 11.5* 11.2*  HCT 40.3 36.3* 35.4*  MCV 92.9 94.0 93.2  PLT 266 212 086   Basic Metabolic Panel: Recent Labs  Lab 01/02/23 0900 01/03/23 0449 01/04/23 0623  NA 138 138 139  K 3.2* 4.2 4.0  CL 101 105 105  CO2 '28 27 25  '$ GLUCOSE 129* 109* 108*  BUN '11 10 10  '$ CREATININE 0.83 0.75 0.81  CALCIUM 8.5* 8.7* 8.6*    CBG: Recent Labs  Lab 01/03/23 0733 01/03/23 1247 01/03/23 1710 01/03/23 2118 01/04/23 0719  GLUCAP 131* 92 86 117* 104*    Discharge time spent: greater than 30 minutes.  Signed: Loletha Grayer, MD [facility] 01/04/2023

## 2023-01-04 NOTE — TOC Initial Note (Signed)
Transition of Care [facility]) - Initial/Assessment Note    Patient Details  Name: Darius Norris MRN: [id number] Date of Birth: 09/21/1951  Transition of [facility]) CM/SW Contact:    Laurena Slimmer, RN Phone Number: 01/04/2023, 11:57 AM  Clinical Narrative:                   Transition of Care [facility]) Screening Note   Patient Details  Name: Darius Norris Date of Birth: 07/08/1951   Transition of Care [facility] CM/SW Contact:    Laurena Slimmer, RN Phone Number: 01/04/2023, 11:57 AM    Transition of Care Department [facility]) has reviewed patient and no TOC needs have been identified at this time. We will continue to monitor patient advancement through interdisciplinary progression rounds. If new patient transition needs arise, please place a TOC consult.         Patient Goals and CMS Choice            Expected Discharge Plan and Services         Expected Discharge Date: 01/04/23                                    Prior Living Arrangements/Services                       Activities of Daily Living Home Assistive Devices/Equipment: Eyeglasses, Electric scooter, Dentures (specify type) ADL Screening (condition at time of admission) Patient's cognitive ability adequate to safely complete daily activities?: Yes Is the patient deaf or have difficulty hearing?: No Does the patient have difficulty seeing, even when wearing glasses/contacts?: No Does the patient have difficulty concentrating, remembering, or making decisions?: No Patient able to express need for assistance with ADLs?: Yes Does the patient have difficulty dressing or bathing?: No Independently performs ADLs?: Yes (appropriate for developmental age) Does the patient have difficulty walking or climbing stairs?: No Weakness of Legs: None Weakness of Arms/Hands: None  Permission Sought/Granted                  Emotional Assessment              Admission diagnosis:  Elevated troponin  [R79.89] NSTEMI (non-ST elevated myocardial infarction) ([place]) [I21.4] Atrial fibrillation with RVR ([place]) [I48.91] Rapid atrial fibrillation ([place]) [I48.91] Patient Active Problem List   Diagnosis Date Noted   Myocardial injury 01/03/2023   Type 2 diabetes mellitus with hyperlipidemia ([place]) 01/03/2023   Obesity (BMI 30-39.9) 01/03/2023   Aspiration pneumonia ([place]) 05/19/2022   Severe sepsis ([place]) 05/19/2022   Diarrhea 05/19/2022   Chronic diastolic CHF (congestive heart failure) ([place]) 05/17/2022   Paroxysmal atrial fibrillation with RVR ([place]) 05/16/2022   Rheumatoid arthritis ([place]) 05/16/2022   Age-related nuclear cataract, bilateral 02/08/2022   Cortical age-related cataract, right eye 02/08/2022   Encounter for observation for other suspected diseases and conditions ruled out 02/08/2022   COVID-19 02/08/2022   Diverticulitis of colon 02/08/2022   Dysphagia, oropharyngeal phase 02/08/2022   Encounter for surgical aftercare following surgery on the sense organs 02/08/2022   Hypertensive heart disease without heart failure 02/08/2022   Oral lesion 02/08/2022   Other age-related cataract 02/08/2022   Other B-complex deficiencies 02/08/2022   Other bursitis of elbow, right elbow 02/08/2022   Postnasal drip 02/08/2022   Rheumatism and fibrositis 02/08/2022   Tinnitus 02/08/2022   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 ([place]) 42/70/6237  Atrial fibrillation with RVR ([place]) 12/05/2021   HLD (hyperlipidemia) 12/05/2021   COPD (chronic obstructive pulmonary disease) ([place]) 12/05/2021   Depression with anxiety 12/05/2021   Psoriasis 12/05/2021   Carotid atherosclerosis, bilateral 06/04/2020   Chronic hypoxic respiratory failure ([place]) 05/28/2018   A-fib ([place]) 05/02/2018   Chest pain 11/17/2017   Unstable angina ([place]) 09/04/2016   Uncontrolled hypertension 09/04/2016   Hypokalemia 09/04/2016   Leukocytosis 09/04/2016   COPD with acute exacerbation ([place]) 12/15/2015    Elevated troponin 12/15/2015   HTN (hypertension) 12/15/2015   GERD (gastroesophageal reflux disease) 12/15/2015   CAD (coronary artery disease) 12/15/2015   Angina pectoris ([place]) 12/15/2015   Benign neoplasm of colon 12/18/2001   Migraine 12/18/2000   Nephritis and nephropathy, 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 12/18/1956   PCP:  [facility], [place]:   [facility] [place], [address] [place] [address] Phone: [phone] Fax: [place], [place] [address] Phone: [phone] Fax: [phone]     Social Determinants of Health (SDOH) Social History: SDOH Screenings   Food Insecurity: No Food Insecurity (01/03/2023)  Housing: Low Risk  (01/03/2023)  Transportation Needs: No Transportation Needs (01/03/2023)  Utilities: Not At Risk (01/03/2023)  Depression (PHQ2-9): Medium Risk (11/29/2022)  Tobacco Use: Medium Risk (01/04/2023)   SDOH Interventions:     Readmission Risk Interventions     No data to display

## 2023-01-05 LAB — LIPOPROTEIN A (LPA): Lipoprotein (a): 21.8 nmol/L (ref <75.0)

## 2023-02-28 ENCOUNTER — Other Ambulatory Visit: Payer: Self-pay

## 2023-02-28 ENCOUNTER — Emergency Department: Payer: No Typology Code available for payment source

## 2023-02-28 ENCOUNTER — Emergency Department
Admission: EM | Admit: 2023-02-28 | Discharge: 2023-02-28 | Disposition: A | Discharge status: Home / Self Care | Payer: No Typology Code available for payment source | Attending: Student in an Organized Health Care Education/Training Program | Admitting: Student in an Organized Health Care Education/Training Program

## 2023-02-28 DIAGNOSIS — M7918 Myalgia, other site: Secondary | ICD-10-CM

## 2023-02-28 DIAGNOSIS — S61411A Laceration without foreign body of right hand, initial encounter: Secondary | ICD-10-CM | POA: Insufficient documentation

## 2023-02-28 DIAGNOSIS — R1031 Right lower quadrant pain: Secondary | ICD-10-CM | POA: Insufficient documentation

## 2023-02-28 DIAGNOSIS — J449 Chronic obstructive pulmonary disease, unspecified: Secondary | ICD-10-CM | POA: Diagnosis not present

## 2023-02-28 DIAGNOSIS — R0789 Other chest pain: Secondary | ICD-10-CM | POA: Insufficient documentation

## 2023-02-28 DIAGNOSIS — Z7901 Long term (current) use of anticoagulants: Secondary | ICD-10-CM | POA: Insufficient documentation

## 2023-02-28 DIAGNOSIS — Y92410 Unspecified street and highway as the place of occurrence of the external cause: Secondary | ICD-10-CM | POA: Insufficient documentation

## 2023-02-28 LAB — CBC WITH DIFFERENTIAL/PLATELET
Abs Immature Granulocytes: 0.06 10*3/uL (ref 0.00–0.07)
Basophils Absolute: 0.1 10*3/uL (ref 0.0–0.1)
Basophils Relative: 1 %
Eosinophils Absolute: 0.1 10*3/uL (ref 0.0–0.5)
Eosinophils Relative: 1 %
HCT: 39.8 % (ref 39.0–52.0)
Hemoglobin: 12.2 g/dL — ABNORMAL LOW (ref 13.0–17.0)
Immature Granulocytes: 1 %
Lymphocytes Relative: 16 %
Lymphs Abs: 1.7 10*3/uL (ref 0.7–4.0)
MCH: 28.9 pg (ref 26.0–34.0)
MCHC: 30.7 g/dL (ref 30.0–36.0)
MCV: 94.3 fL (ref 80.0–100.0)
Monocytes Absolute: 0.3 10*3/uL (ref 0.1–1.0)
Monocytes Relative: 3 %
Neutro Abs: 8.4 10*3/uL — ABNORMAL HIGH (ref 1.7–7.7)
Neutrophils Relative %: 78 %
Platelets: 316 10*3/uL (ref 150–400)
RBC: 4.22 MIL/uL (ref 4.22–5.81)
RDW: 16.8 % — ABNORMAL HIGH (ref 11.5–15.5)
WBC: 10.6 10*3/uL — ABNORMAL HIGH (ref 4.0–10.5)
nRBC: 0 % (ref 0.0–0.2)

## 2023-02-28 LAB — COMPREHENSIVE METABOLIC PANEL
ALT: 20 U/L (ref 0–44)
AST: 22 U/L (ref 15–41)
Albumin: 3.5 g/dL (ref 3.5–5.0)
Alkaline Phosphatase: 92 U/L (ref 38–126)
Anion gap: 5 (ref 5–15)
BUN: 10 mg/dL (ref 8–23)
CO2: 27 mmol/L (ref 22–32)
Calcium: 8.4 mg/dL — ABNORMAL LOW (ref 8.9–10.3)
Chloride: 105 mmol/L (ref 98–111)
Creatinine, Ser: 0.83 mg/dL (ref 0.61–1.24)
GFR, Estimated: >60 mL/min (ref >60)
Glucose, Bld: 131 mg/dL — ABNORMAL HIGH (ref 70–99)
Potassium: 4.1 mmol/L (ref 3.5–5.1)
Sodium: 137 mmol/L (ref 135–145)
Total Bilirubin: 0.6 mg/dL (ref 0.3–1.2)
Total Protein: 6.3 g/dL — ABNORMAL LOW (ref 6.5–8.1)

## 2023-02-28 LAB — TROPONIN I (HIGH SENSITIVITY)
Troponin I (High Sensitivity): 9 ng/L (ref <18)
Troponin I (High Sensitivity): 9 ng/L (ref <18)

## 2023-02-28 MED ORDER — TRAMADOL HCL 50 MG PO TABS: 50.0000 mg | ORAL_TABLET | Freq: Four times a day (QID) | ORAL | 0 refills | Status: DC | PRN

## 2023-02-28 MED ORDER — MORPHINE SULFATE (PF) 4 MG/ML IV SOLN
4.0000 mg | INTRAVENOUS | Status: DC | PRN
  Administered 2023-02-28: 4 mg via INTRAVENOUS
  Filled 2023-02-28: qty 1

## 2023-02-28 MED ORDER — ONDANSETRON HCL 4 MG/2ML IJ SOLN
4.0000 mg | Freq: Once | INTRAMUSCULAR | Status: AC
  Administered 2023-02-28: 4 mg via INTRAVENOUS
  Filled 2023-02-28: qty 2

## 2023-02-28 MED ORDER — IPRATROPIUM-ALBUTEROL 0.5-2.5 (3) MG/3ML IN SOLN
3.0000 mL | Freq: Once | RESPIRATORY_TRACT | Status: DC
  Filled 2023-02-28: qty 3

## 2023-02-28 MED ORDER — IOHEXOL 300 MG/ML  SOLN
100.0000 mL | Freq: Once | INTRAMUSCULAR | Status: AC | PRN
  Administered 2023-02-28: 100 mL via INTRAVENOUS
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
## 2023-02-28 NOTE — ED Notes (Signed)
Lab called to check on trop and they state it is currently running and hasn't resulted yet. Pt updated and new vitals collected.
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
## 2023-08-23 ENCOUNTER — Emergency Department: Payer: No Typology Code available for payment source

## 2023-08-23 ENCOUNTER — Inpatient Hospital Stay
Admission: EM | Admit: 2023-08-23 | Discharge: 2023-08-26 | DRG: 177 | Disposition: A | Discharge status: Home Health | Payer: No Typology Code available for payment source | Attending: Internal Medicine | Admitting: Internal Medicine

## 2023-08-23 ENCOUNTER — Other Ambulatory Visit: Payer: Self-pay

## 2023-08-23 DIAGNOSIS — Z9049 Acquired absence of other specified parts of digestive tract: Secondary | ICD-10-CM

## 2023-08-23 DIAGNOSIS — J440 Chronic obstructive pulmonary disease with acute lower respiratory infection: Secondary | ICD-10-CM | POA: Diagnosis present

## 2023-08-23 DIAGNOSIS — Z79631 Long term (current) use of antimetabolite agent: Secondary | ICD-10-CM

## 2023-08-23 DIAGNOSIS — J189 Pneumonia, unspecified organism: Secondary | ICD-10-CM | POA: Diagnosis present

## 2023-08-23 DIAGNOSIS — Z881 Allergy status to other antibiotic agents status: Secondary | ICD-10-CM

## 2023-08-23 DIAGNOSIS — Z888 Allergy status to other drugs, medicaments and biological substances status: Secondary | ICD-10-CM

## 2023-08-23 DIAGNOSIS — E785 Hyperlipidemia, unspecified: Secondary | ICD-10-CM | POA: Insufficient documentation

## 2023-08-23 DIAGNOSIS — U071 COVID-19: Principal | ICD-10-CM | POA: Diagnosis present

## 2023-08-23 DIAGNOSIS — K219 Gastro-esophageal reflux disease without esophagitis: Secondary | ICD-10-CM | POA: Insufficient documentation

## 2023-08-23 DIAGNOSIS — Z7982 Long term (current) use of aspirin: Secondary | ICD-10-CM

## 2023-08-23 DIAGNOSIS — Z8249 Family history of ischemic heart disease and other diseases of the circulatory system: Secondary | ICD-10-CM

## 2023-08-23 DIAGNOSIS — I110 Hypertensive heart disease with heart failure: Secondary | ICD-10-CM | POA: Diagnosis present

## 2023-08-23 DIAGNOSIS — J441 Chronic obstructive pulmonary disease with (acute) exacerbation: Secondary | ICD-10-CM | POA: Diagnosis present

## 2023-08-23 DIAGNOSIS — Z87891 Personal history of nicotine dependence: Secondary | ICD-10-CM

## 2023-08-23 DIAGNOSIS — Z885 Allergy status to narcotic agent status: Secondary | ICD-10-CM

## 2023-08-23 DIAGNOSIS — J9611 Chronic respiratory failure with hypoxia: Secondary | ICD-10-CM | POA: Diagnosis present

## 2023-08-23 DIAGNOSIS — J439 Emphysema, unspecified: Secondary | ICD-10-CM | POA: Diagnosis present

## 2023-08-23 DIAGNOSIS — R0602 Shortness of breath: Principal | ICD-10-CM

## 2023-08-23 DIAGNOSIS — I10 Essential (primary) hypertension: Secondary | ICD-10-CM | POA: Diagnosis present

## 2023-08-23 DIAGNOSIS — Z79899 Other long term (current) drug therapy: Secondary | ICD-10-CM

## 2023-08-23 DIAGNOSIS — E119 Type 2 diabetes mellitus without complications: Secondary | ICD-10-CM | POA: Diagnosis present

## 2023-08-23 DIAGNOSIS — I5032 Chronic diastolic (congestive) heart failure: Secondary | ICD-10-CM | POA: Diagnosis present

## 2023-08-23 DIAGNOSIS — Z9989 Dependence on other enabling machines and devices: Secondary | ICD-10-CM

## 2023-08-23 DIAGNOSIS — Z7902 Long term (current) use of antithrombotics/antiplatelets: Secondary | ICD-10-CM

## 2023-08-23 DIAGNOSIS — J159 Unspecified bacterial pneumonia: Secondary | ICD-10-CM | POA: Diagnosis present

## 2023-08-23 DIAGNOSIS — I2510 Atherosclerotic heart disease of native coronary artery without angina pectoris: Secondary | ICD-10-CM | POA: Insufficient documentation

## 2023-08-23 DIAGNOSIS — I252 Old myocardial infarction: Secondary | ICD-10-CM

## 2023-08-23 DIAGNOSIS — I480 Paroxysmal atrial fibrillation: Secondary | ICD-10-CM | POA: Insufficient documentation

## 2023-08-23 DIAGNOSIS — Z823 Family history of stroke: Secondary | ICD-10-CM

## 2023-08-23 DIAGNOSIS — Z9981 Dependence on supplemental oxygen: Secondary | ICD-10-CM

## 2023-08-23 DIAGNOSIS — R0902 Hypoxemia: Secondary | ICD-10-CM

## 2023-08-23 LAB — CBC WITH DIFFERENTIAL/PLATELET
Abs Immature Granulocytes: 0.03 10*3/uL (ref 0.00–0.07)
Basophils Absolute: 0 10*3/uL (ref 0.0–0.1)
Basophils Relative: 0 %
Eosinophils Absolute: 0.2 10*3/uL (ref 0.0–0.5)
Eosinophils Relative: 3 %
HCT: 33.7 % — ABNORMAL LOW (ref 39.0–52.0)
Hemoglobin: 10.7 g/dL — ABNORMAL LOW (ref 13.0–17.0)
Immature Granulocytes: 1 %
Lymphocytes Relative: 16 %
Lymphs Abs: 0.9 10*3/uL (ref 0.7–4.0)
MCH: 30.7 pg (ref 26.0–34.0)
MCHC: 31.8 g/dL (ref 30.0–36.0)
MCV: 96.8 fL (ref 80.0–100.0)
Monocytes Absolute: 0.1 10*3/uL (ref 0.1–1.0)
Monocytes Relative: 2 %
Neutro Abs: 4.4 10*3/uL (ref 1.7–7.7)
Neutrophils Relative %: 78 %
Platelets: 201 10*3/uL (ref 150–400)
RBC: 3.48 MIL/uL — ABNORMAL LOW (ref 4.22–5.81)
RDW: 19.9 % — ABNORMAL HIGH (ref 11.5–15.5)
WBC: 5.6 10*3/uL (ref 4.0–10.5)
nRBC: 0 % (ref 0.0–0.2)

## 2023-08-23 LAB — COMPREHENSIVE METABOLIC PANEL
ALT: 91 U/L — ABNORMAL HIGH (ref 0–44)
AST: 71 U/L — ABNORMAL HIGH (ref 15–41)
Albumin: 3.6 g/dL (ref 3.5–5.0)
Alkaline Phosphatase: 76 U/L (ref 38–126)
Anion gap: 11 (ref 5–15)
BUN: 9 mg/dL (ref 8–23)
CO2: 27 mmol/L (ref 22–32)
Calcium: 8.8 mg/dL — ABNORMAL LOW (ref 8.9–10.3)
Chloride: 101 mmol/L (ref 98–111)
Creatinine, Ser: 0.86 mg/dL (ref 0.61–1.24)
GFR, Estimated: >60 mL/min (ref >60)
Glucose, Bld: 142 mg/dL — ABNORMAL HIGH (ref 70–99)
Potassium: 3.7 mmol/L (ref 3.5–5.1)
Sodium: 139 mmol/L (ref 135–145)
Total Bilirubin: 0.8 mg/dL (ref 0.3–1.2)
Total Protein: 6.8 g/dL (ref 6.5–8.1)

## 2023-08-23 MED ORDER — SODIUM CHLORIDE 0.9 % IV SOLN
1.0000 g | Freq: Once | INTRAVENOUS | Status: AC
  Administered 2023-08-23: 1 g via INTRAVENOUS
  Filled 2023-08-23: qty 10

## 2023-08-23 MED ORDER — IPRATROPIUM-ALBUTEROL 0.5-2.5 (3) MG/3ML IN SOLN
3.0000 mL | Freq: Once | RESPIRATORY_TRACT | Status: AC
  Administered 2023-08-23: 3 mL via RESPIRATORY_TRACT
  Filled 2023-08-23: qty 3

## 2023-08-23 MED ORDER — SODIUM CHLORIDE 0.9 % IV BOLUS
1000.0000 mL | Freq: Once | INTRAVENOUS | Status: AC
  Administered 2023-08-23: 1000 mL via INTRAVENOUS

## 2023-08-23 MED ORDER — SODIUM CHLORIDE 0.9 % IV SOLN
500.0000 mg | Freq: Once | INTRAVENOUS | Status: AC
  Administered 2023-08-23: 500 mg via INTRAVENOUS
  Filled 2023-08-23: qty 5

## 2023-08-23 MED ORDER — METHYLPREDNISOLONE SODIUM SUCC 125 MG IJ SOLR
125.0000 mg | Freq: Once | INTRAMUSCULAR | Status: AC
  Administered 2023-08-23: 125 mg via INTRAVENOUS
  Filled 2023-08-23: qty 2

## 2023-08-23 NOTE — ED Provider Notes (Signed)
[facility] Provider Note    Event Date/Time   First MD Initiated Contact with Patient 08/23/23 2332     (approximate)   History   Shortness of Breath   HPI  Darius Norris is a 72 y.o. male brought to the ED via EMS from home with a chief complaint of shortness of breath.  Patient with a history of COPD on baseline 3 L nasal cannula oxygen, diabetes, hypertension, atrial fibrillation.  He was seen by his doctor last week at the [place] for cold-like symptoms, tested positive for COVID.  Did not start antiviral because patient has allergy to Molnupiravir.  States increased generalized weakness, tightness, generalized pain, progressive shortness of breath since, decreased appetite.  Has had to increase his oxygen to 4 L.  Denies fever, chills, abdominal pain, nausea, vomiting or dizziness.     Past Medical History   Past Medical History:  Diagnosis Date   A-fib [facility])    CAD (coronary artery disease)    Colitis    COPD (chronic obstructive pulmonary disease) (HCC)    Diabetes mellitus without complication (HCC)    Emphysema lung (HCC)    GERD (gastroesophageal reflux disease)    Hypertension    MI (myocardial infarction) (HCC) 1999     Active Problem List   Patient Active Problem List   Diagnosis Date Noted   Myocardial injury 01/03/2023   Type 2 diabetes mellitus with hyperlipidemia (HCC) 01/03/2023   Obesity (BMI 30-39.9) 01/03/2023   Aspiration pneumonia (HCC) 05/19/2022   Severe sepsis (HCC) 05/19/2022   Diarrhea 05/19/2022   Chronic diastolic CHF (congestive heart failure) (HCC) 05/17/2022   Paroxysmal atrial fibrillation with RVR (HCC) 05/16/2022   Rheumatoid arthritis (HCC) 05/16/2022   Age-related nuclear cataract, bilateral 02/08/2022   Cortical age-related cataract, right eye 02/08/2022   Encounter for observation for other suspected diseases and conditions ruled out 02/08/2022   COVID-19 02/08/2022   Diverticulitis of colon 02/08/2022    Dysphagia, oropharyngeal phase 02/08/2022   Encounter for surgical aftercare following surgery on the sense organs 02/08/2022   Hypertensive heart disease without heart failure 02/08/2022   Oral lesion 02/08/2022   Other age-related cataract 02/08/2022   Other B-complex deficiencies 02/08/2022   Other bursitis of elbow, right elbow 02/08/2022   Postnasal drip 02/08/2022   Rheumatism and fibrositis 02/08/2022   Tinnitus 02/08/2022   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) 02/08/2022   Atrial fibrillation with RVR (HCC) 12/05/2021   HLD (hyperlipidemia) 12/05/2021   COPD (chronic obstructive pulmonary disease) (HCC) 12/05/2021   Depression with anxiety 12/05/2021   Psoriasis 12/05/2021   Carotid atherosclerosis, bilateral 06/04/2020   Chronic hypoxic respiratory failure (HCC) 05/28/2018   A-fib (HCC) 05/02/2018   Chest pain 11/17/2017   Unstable angina (HCC) 09/04/2016   Uncontrolled hypertension 09/04/2016   Hypokalemia 09/04/2016   Leukocytosis 09/04/2016   COPD with acute exacerbation (HCC) 12/15/2015   Elevated troponin 12/15/2015   HTN (hypertension) 12/15/2015   GERD (gastroesophageal reflux disease) 12/15/2015   CAD (coronary artery disease) 12/15/2015   Angina pectoris (HCC) 12/15/2015   Benign neoplasm of colon 12/18/2001   Migraine 12/18/2000   Nephritis and nephropathy, 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 12/18/1956     Past Surgical History   Past Surgical History:  Procedure Laterality Date   ABDOMINAL HERNIA REPAIR Bilateral    CHOLECYSTECTOMY     COLONOSCOPY WITH PROPOFOL N/A 02/01/2018   Procedure: COLONOSCOPY WITH PROPOFOL;  Surgeon: Wyline Mood, MD;  Location: [facility]  ENDOSCOPY;  Service: Gastroenterology;  Laterality: N/A;   KNEE ARTHROSCOPY Bilateral    LEFT HEART CATH AND CORONARY ANGIOGRAPHY N/A 01/03/2023   Procedure: LEFT HEART CATH AND CORONARY ANGIOGRAPHY and possible pci and stent;  Surgeon: Marcina Millard, MD;  Location:  [facility] INVASIVE CV LAB;  Service: Cardiovascular;  Laterality: N/A;     Home Medications   Prior to Admission medications   Medication Sig Start Date End Date Taking? Authorizing Provider  acetaminophen (TYLENOL) 325 MG tablet Take 650 mg by mouth every 6 (six) hours as needed.    [provider]  albuterol (VENTOLIN HFA) 108 (90 Base) MCG/ACT inhaler Inhale into the lungs. 03/30/21   [provider]  aspirin EC 81 MG EC tablet Take 1 tablet (81 mg total) by mouth daily. 06/09/18   [name], [name], MD  atorvastatin (LIPITOR) 80 MG tablet Take 0.5 tablets (40 mg total) by mouth at bedtime. Half of an 39 01/04/23   Alford Highland, MD  busPIRone (BUSPAR) 10 MG tablet Take by mouth.    [provider]  clobetasol (TEMOVATE) 0.05 % external solution Apply topically. 02/28/21   [provider]  dabigatran (PRADAXA) 150 MG CAPS capsule Take 150 mg by mouth 2 (two) times daily.    [provider]  diltiazem (CARDIZEM CD) 300 MG 24 hr capsule Take 1 capsule (300 mg total) by mouth daily. 01/04/23 02/03/23  Alford Highland, MD  furosemide (LASIX) 40 MG tablet Take 1 tablet (40 mg total) by mouth daily. 05/24/22 01/02/23  Darlin Priestly, MD  gabapentin (NEURONTIN) 300 MG capsule Take 1 capsule by mouth 3 (three) times daily as needed. 10/10/21   [provider]  hydrocortisone 2.5 % cream Apply topically. 02/28/21   [provider]  isosorbide mononitrate (IMDUR) 60 MG 24 hr tablet Take 60 mg by mouth daily.    [provider]  lidocaine (LIDODERM) 5 % Place 1 patch onto the skin daily. Remove & Discard patch within 12 hours or as directed by MD    [provider]  loratadine (CLARITIN) 10 MG tablet Take 10 mg by mouth daily as needed for allergies.     [provider]  Melatonin 3 MG CAPS Take 2 capsules by mouth at bedtime. 10/10/21   [provider]  methotrexate (RHEUMATREX) 2.5 MG tablet Take 10 mg by mouth once a  week. 03/02/21   [provider]  metoprolol tartrate (LOPRESSOR) 100 MG tablet Take 1 tablet (100 mg total) by mouth 2 (two) times daily. 05/24/22 01/02/23  Darlin Priestly, MD  mometasone [facility]) 220 MCG/INH inhaler Take by mouth. 03/10/21   [provider]  omeprazole (PRILOSEC) 20 MG capsule Take 20 mg by mouth daily.    [provider]  senna (SENOKOT) 8.6 MG TABS tablet Take 1 tablet by mouth daily as needed for mild constipation.    [provider]  simethicone (MYLICON) 80 MG chewable tablet Chew 80 mg by mouth 4 (four) times daily as needed for flatulence. 03/04/22   [provider]  Tiotropium Bromide-Olodaterol (STIOLTO RESPIMAT) 2.5-2.5 MCG/ACT AERS Inhale 2 each into the lungs 2 (two) times daily.    [provider]  traMADol (ULTRAM) 50 MG tablet Take 1 tablet (50 mg total) by mouth every 6 (six) hours as needed. 02/28/23 02/28/24  Willy Eddy, MD     Allergies  Zantac [ranitidine hcl], Molnupiravir, Codeine, Erythromycin, Morphine, Tiotropium bromide monohydrate, and Hydrocodone   Family History   Family History  Problem Relation Age of Onset   CAD Father    Stroke Father    CAD Sister    Stroke Brother      Physical Exam  Triage Vital Signs: ED Triage Vitals  Encounter Vitals Group     BP 08/23/23 2134 (!) 158/66     Systolic BP Percentile --      Diastolic BP Percentile --      Pulse Rate 08/23/23 2134 66     Resp 08/23/23 2134 20     Temp 08/23/23 2134 97.9 F (36.6 C)     Temp Source 08/23/23 2134 Oral     SpO2 08/23/23 2134 93 %     Weight 08/23/23 2133 185 lb (83.9 kg)     Height 08/23/23 2133 5\' 6"  (1.676 m)     Head Circumference --      Peak Flow --      Pain Score 08/23/23 2133 6     Pain Loc --      Pain Education --      Exclude from Growth Chart --     Updated Vital Signs: BP 129/63   Pulse 68   Temp 97.9 F (36.6 C) (Oral)   Resp 15   Ht 5\' 6"  (1.676 m)   Wt 83.9 kg   SpO2 100%   BMI  29.86 kg/m    General: Awake, moderate distress.  CV:  RRR.  Good peripheral perfusion.  Resp:  Increased effort. + Retractions.  Diminished aeration bilaterally. Abd:  Nontender.  No distention.  Other:  1+ pitting pedal edema bilaterally.   ED Results / Procedures / Treatments  Labs (all labs ordered are listed, but only abnormal results are displayed) Labs Reviewed  SARS CORONAVIRUS 2 BY RT PCR - Abnormal; Notable for the following components:      Result Value   SARS Coronavirus 2 by RT PCR POSITIVE (*)    All other components within normal limits  CBC WITH DIFFERENTIAL/PLATELET - Abnormal; Notable for the following components:   RBC 3.48 (*)    Hemoglobin 10.7 (*)    HCT 33.7 (*)    RDW 19.9 (*)    All other components within normal limits  COMPREHENSIVE METABOLIC PANEL - Abnormal; Notable for the following components:   Glucose, Bld 142 (*)    Calcium 8.8 (*)    AST 71 (*)    ALT 91 (*)    All other components within normal limits  CULTURE, BLOOD (ROUTINE X 2)  CULTURE, BLOOD (ROUTINE X 2)  LACTIC ACID, PLASMA  TROPONIN I (HIGH SENSITIVITY)     EKG  ED ECG REPORT I, Konni Kesinger J, the attending physician, personally viewed and interpreted this ECG.   Date: 08/24/2023  EKG Time: 2138  Rate: 67  Rhythm: normal sinus rhythm  Axis: Normal  Intervals:none  ST&T Change: Nonspecific    RADIOLOGY I have independently visualized and interpreted patient's x-ray as well as noted the radiology interpretation:  Chest x-ray: Groundglass and interstitial opacities in lower lungs  Official radiology report(s): DG Chest 2 View  Result Date: 08/23/2023 CLINICAL DATA:  Shortness of breath, tested positive for COVID EXAM: CHEST - 2 VIEW COMPARISON:  Radiograph 12/23/2022 FINDINGS: Stable cardiomediastinal silhouette. Aortic atherosclerotic calcification. Emphysema. Interstitial prominence and ground-glass opacities in the lower lungs has increased from 01/02/2023  suspicious for atypical infection. IMPRESSION: 1. Ground-glass and interstitial opacities in the lower lungs compatible with atypical infection. 2. Emphysema. Electronically Signed   By: Joselyn Glassman  Stutzman M.D.   On: 08/23/2023 22:25     PROCEDURES:  Critical Care performed: Yes, see critical care procedure note(s)  CRITICAL CARE Performed by: Irean Hong   Total critical care time: 30 minutes  Critical care time was exclusive of separately billable procedures and treating other patients.  Critical care was necessary to treat or prevent imminent or life-threatening deterioration.  Critical care was time spent personally by me on the following activities: development of treatment plan with patient and/or surrogate as well as nursing, discussions with consultants, evaluation of patient's response to treatment, examination of patient, obtaining history from patient or surrogate, ordering and performing treatments and interventions, ordering and review of laboratory studies, ordering and review of radiographic studies, pulse oximetry and re-evaluation of patient's condition.   Marland Kitchen1-3 Lead EKG Interpretation  Performed by: Irean Hong, MD Authorized by: Irean Hong, MD     Interpretation: normal     ECG rate:  65   ECG rate assessment: normal     Rhythm: sinus rhythm     Ectopy: none     Conduction: normal   Comments:     Patient placed on cardiac monitor to evaluate for arrhythmias    MEDICATIONS ORDERED IN ED: Medications  ipratropium-albuterol (DUONEB) 0.5-2.5 (3) MG/3ML nebulizer solution 3 mL (has no administration in time range)  sodium chloride 0.9 % bolus 1,000 mL (1,000 mLs Intravenous New Bag/Given 08/23/23 2350)  cefTRIAXone (ROCEPHIN) 1 g in sodium chloride 0.9 % 100 mL IVPB (0 g Intravenous Stopped 08/24/23 0031)  azithromycin (ZITHROMAX) 500 mg in sodium chloride 0.9 % 250 mL IVPB (0 mg Intravenous Stopped 08/24/23 0106)  methylPREDNISolone sodium succinate (SOLU-MEDROL) 125  mg/2 mL injection 125 mg (125 mg Intravenous Given 08/23/23 2352)  ipratropium-albuterol (DUONEB) 0.5-2.5 (3) MG/3ML nebulizer solution 3 mL (3 mLs Nebulization Given 08/23/23 2357)  oxyCODONE-acetaminophen (PERCOCET/ROXICET) 5-325 MG per tablet 1 tablet (1 tablet Oral Given 08/24/23 0019)     IMPRESSION / MDM / ASSESSMENT AND PLAN / ED COURSE  I reviewed the triage vital signs and the nursing notes.                             72 year old male presenting with shortness of breath and hypoxia. Differential includes, but is not limited to, viral syndrome, bronchitis including COPD exacerbation, pneumonia, reactive airway disease including asthma, CHF including exacerbation with or without pulmonary/interstitial edema, pneumothorax, ACS, thoracic trauma, and pulmonary embolism.  Personally reviewed patient's records and note a [facility] encounter on 08/16/2023 which I am unfortunately not able to view the chart given patient was seen at the [place].  Patient's presentation is most consistent with acute presentation with potential threat to life or bodily function.  The patient is on the cardiac monitor to evaluate for evidence of arrhythmia and/or significant heart rate changes.  Laboratory results demonstrate normal WBC 5.6, unremarkable electrolytes.  Will add blood cultures, lactic acid, troponin, COVID swab.  Initiate IV antibiotics, IV Solu-Medrol, DuoNeb.  Will reassess.  Clinical Course as of 08/24/23 0116  Fri Aug 24, 2023  0116 Patient states he feels somewhat better after DuoNeb.  Remains tachypneic with increased work of breathing.  Will administer additional DuoNeb and consult hospitalist services for evaluation and admission. [JS]    Clinical Course User Index [JS] Irean Hong, MD     FINAL CLINICAL IMPRESSION(S) / ED DIAGNOSES   Final diagnoses:  Shortness of breath  Community acquired  pneumonia, unspecified laterality  Pneumonia due to COVID-19 virus  COPD exacerbation (HCC)   Hypoxia     Rx / DC Orders   ED Discharge Orders     None        Note:  This document was prepared using Dragon voice recognition software and may include unintentional dictation errors.   Irean Hong, MD 08/24/23 [phone]

## 2023-08-23 NOTE — ED Triage Notes (Signed)
Pt to ED via EMS from home, pt tested positive for covid 1 week ago and has been having sob since. Pt on 3L chronically. Pt c/o generalized pain. Pt has hx copd.

## 2023-08-24 DIAGNOSIS — J9611 Chronic respiratory failure with hypoxia: Secondary | ICD-10-CM | POA: Diagnosis present

## 2023-08-24 DIAGNOSIS — Z888 Allergy status to other drugs, medicaments and biological substances status: Secondary | ICD-10-CM | POA: Diagnosis not present

## 2023-08-24 DIAGNOSIS — U071 COVID-19: Secondary | ICD-10-CM

## 2023-08-24 DIAGNOSIS — K219 Gastro-esophageal reflux disease without esophagitis: Secondary | ICD-10-CM | POA: Insufficient documentation

## 2023-08-24 DIAGNOSIS — Z7982 Long term (current) use of aspirin: Secondary | ICD-10-CM | POA: Diagnosis not present

## 2023-08-24 DIAGNOSIS — Z9049 Acquired absence of other specified parts of digestive tract: Secondary | ICD-10-CM | POA: Diagnosis not present

## 2023-08-24 DIAGNOSIS — E119 Type 2 diabetes mellitus without complications: Secondary | ICD-10-CM | POA: Diagnosis present

## 2023-08-24 DIAGNOSIS — J441 Chronic obstructive pulmonary disease with (acute) exacerbation: Secondary | ICD-10-CM | POA: Diagnosis present

## 2023-08-24 DIAGNOSIS — R0602 Shortness of breath: Secondary | ICD-10-CM | POA: Diagnosis present

## 2023-08-24 DIAGNOSIS — J189 Pneumonia, unspecified organism: Secondary | ICD-10-CM | POA: Diagnosis present

## 2023-08-24 DIAGNOSIS — I10 Essential (primary) hypertension: Secondary | ICD-10-CM | POA: Diagnosis not present

## 2023-08-24 DIAGNOSIS — Z79899 Other long term (current) drug therapy: Secondary | ICD-10-CM | POA: Diagnosis not present

## 2023-08-24 DIAGNOSIS — I252 Old myocardial infarction: Secondary | ICD-10-CM | POA: Diagnosis not present

## 2023-08-24 DIAGNOSIS — Z885 Allergy status to narcotic agent status: Secondary | ICD-10-CM | POA: Diagnosis not present

## 2023-08-24 DIAGNOSIS — J159 Unspecified bacterial pneumonia: Secondary | ICD-10-CM | POA: Diagnosis present

## 2023-08-24 DIAGNOSIS — I480 Paroxysmal atrial fibrillation: Secondary | ICD-10-CM | POA: Diagnosis present

## 2023-08-24 DIAGNOSIS — Z9981 Dependence on supplemental oxygen: Secondary | ICD-10-CM | POA: Diagnosis not present

## 2023-08-24 DIAGNOSIS — E785 Hyperlipidemia, unspecified: Secondary | ICD-10-CM | POA: Diagnosis present

## 2023-08-24 DIAGNOSIS — I2510 Atherosclerotic heart disease of native coronary artery without angina pectoris: Secondary | ICD-10-CM | POA: Insufficient documentation

## 2023-08-24 DIAGNOSIS — I5032 Chronic diastolic (congestive) heart failure: Secondary | ICD-10-CM | POA: Diagnosis present

## 2023-08-24 DIAGNOSIS — Z7902 Long term (current) use of antithrombotics/antiplatelets: Secondary | ICD-10-CM | POA: Diagnosis not present

## 2023-08-24 DIAGNOSIS — Z881 Allergy status to other antibiotic agents status: Secondary | ICD-10-CM | POA: Diagnosis not present

## 2023-08-24 DIAGNOSIS — Z87891 Personal history of nicotine dependence: Secondary | ICD-10-CM | POA: Diagnosis not present

## 2023-08-24 DIAGNOSIS — Z9989 Dependence on other enabling machines and devices: Secondary | ICD-10-CM | POA: Diagnosis not present

## 2023-08-24 DIAGNOSIS — J439 Emphysema, unspecified: Secondary | ICD-10-CM | POA: Diagnosis present

## 2023-08-24 DIAGNOSIS — I110 Hypertensive heart disease with heart failure: Secondary | ICD-10-CM | POA: Diagnosis present

## 2023-08-24 DIAGNOSIS — J440 Chronic obstructive pulmonary disease with acute lower respiratory infection: Secondary | ICD-10-CM | POA: Diagnosis present

## 2023-08-24 LAB — CBC
HCT: 29.8 % — ABNORMAL LOW (ref 39.0–52.0)
Hemoglobin: 9.7 g/dL — ABNORMAL LOW (ref 13.0–17.0)
MCH: 31 pg (ref 26.0–34.0)
MCHC: 32.6 g/dL (ref 30.0–36.0)
MCV: 95.2 fL (ref 80.0–100.0)
Platelets: 168 10*3/uL (ref 150–400)
RBC: 3.13 MIL/uL — ABNORMAL LOW (ref 4.22–5.81)
RDW: 19.8 % — ABNORMAL HIGH (ref 11.5–15.5)
WBC: 3.6 10*3/uL — ABNORMAL LOW (ref 4.0–10.5)
nRBC: 0 % (ref 0.0–0.2)

## 2023-08-24 LAB — BASIC METABOLIC PANEL
Anion gap: 6 (ref 5–15)
BUN: 8 mg/dL (ref 8–23)
CO2: 27 mmol/L (ref 22–32)
Calcium: 8.5 mg/dL — ABNORMAL LOW (ref 8.9–10.3)
Chloride: 106 mmol/L (ref 98–111)
Creatinine, Ser: 0.72 mg/dL (ref 0.61–1.24)
GFR, Estimated: >60 mL/min (ref >60)
Glucose, Bld: 181 mg/dL — ABNORMAL HIGH (ref 70–99)
Potassium: 4 mmol/L (ref 3.5–5.1)
Sodium: 139 mmol/L (ref 135–145)

## 2023-08-24 LAB — LACTIC ACID, PLASMA: Lactic Acid, Venous: 1.6 mmol/L (ref 0.5–1.9)

## 2023-08-24 LAB — TROPONIN I (HIGH SENSITIVITY): Troponin I (High Sensitivity): 8 ng/L (ref <18)

## 2023-08-24 LAB — SARS CORONAVIRUS 2 BY RT PCR: SARS Coronavirus 2 by RT PCR: POSITIVE — AB

## 2023-08-24 MED ORDER — GABAPENTIN 300 MG PO CAPS: 300.0000 mg | ORAL_CAPSULE | Freq: Three times a day (TID) | ORAL | Status: DC | PRN

## 2023-08-24 MED ORDER — IPRATROPIUM-ALBUTEROL 0.5-2.5 (3) MG/3ML IN SOLN
3.0000 mL | Freq: Four times a day (QID) | RESPIRATORY_TRACT | Status: DC
  Filled 2023-08-24: qty 3

## 2023-08-24 MED ORDER — ATORVASTATIN CALCIUM 20 MG PO TABS
40.0000 mg | ORAL_TABLET | Freq: Every day | ORAL | Status: DC
  Administered 2023-08-24 – 2023-08-25 (×2): 40 mg via ORAL
  Filled 2023-08-24 (×2): qty 2

## 2023-08-24 MED ORDER — BUSPIRONE HCL 5 MG PO TABS
10.0000 mg | ORAL_TABLET | Freq: Two times a day (BID) | ORAL | Status: DC
  Administered 2023-08-24 – 2023-08-26 (×5): 10 mg via ORAL
  Filled 2023-08-24 (×5): qty 2

## 2023-08-24 MED ORDER — SENNA 8.6 MG PO TABS: 1.0000 | ORAL_TABLET | Freq: Every day | ORAL | Status: DC | PRN

## 2023-08-24 MED ORDER — TRAZODONE HCL 50 MG PO TABS
25.0000 mg | ORAL_TABLET | Freq: Every evening | ORAL | Status: DC | PRN
  Administered 2023-08-24 – 2023-08-26 (×3): 25 mg via ORAL
  Filled 2023-08-24 (×3): qty 1

## 2023-08-24 MED ORDER — ISOSORBIDE MONONITRATE ER 60 MG PO TB24
60.0000 mg | ORAL_TABLET | Freq: Every day | ORAL | Status: DC
  Administered 2023-08-24 – 2023-08-26 (×3): 60 mg via ORAL
  Filled 2023-08-24 (×3): qty 1

## 2023-08-24 MED ORDER — IPRATROPIUM-ALBUTEROL 0.5-2.5 (3) MG/3ML IN SOLN: 3.0000 mL | RESPIRATORY_TRACT | Status: DC | PRN

## 2023-08-24 MED ORDER — DILTIAZEM HCL ER COATED BEADS 300 MG PO CP24
300.0000 mg | ORAL_CAPSULE | Freq: Every day | ORAL | Status: DC
  Administered 2023-08-24 – 2023-08-26 (×3): 300 mg via ORAL
  Filled 2023-08-24 (×3): qty 1

## 2023-08-24 MED ORDER — SODIUM CHLORIDE 0.9 % IV SOLN
1.0000 g | Freq: Once | INTRAVENOUS | Status: AC
  Administered 2023-08-24: 1 g via INTRAVENOUS
  Filled 2023-08-24: qty 10

## 2023-08-24 MED ORDER — OXYMETAZOLINE HCL 0.05 % NA SOLN
1.0000 | Freq: Every day | NASAL | Status: DC
  Administered 2023-08-25 – 2023-08-26 (×2): 1 via NASAL
  Filled 2023-08-24: qty 15

## 2023-08-24 MED ORDER — LORATADINE 10 MG PO TABS: 5.0000 mg | ORAL_TABLET | Freq: Every day | ORAL | Status: DC | PRN

## 2023-08-24 MED ORDER — MAGNESIUM OXIDE 400 MG PO TABS
800.0000 mg | ORAL_TABLET | Freq: Every day | ORAL | Status: DC
  Administered 2023-08-24 – 2023-08-26 (×3): 800 mg via ORAL
  Filled 2023-08-24 (×5): qty 2

## 2023-08-24 MED ORDER — SIMETHICONE 80 MG PO CHEW
80.0000 mg | CHEWABLE_TABLET | Freq: Four times a day (QID) | ORAL | Status: DC | PRN
  Administered 2023-08-26 (×2): 80 mg via ORAL
  Filled 2023-08-24 (×3): qty 1

## 2023-08-24 MED ORDER — ALBUTEROL SULFATE HFA 108 (90 BASE) MCG/ACT IN AERS: 2.0000 | INHALATION_SPRAY | RESPIRATORY_TRACT | Status: DC | PRN

## 2023-08-24 MED ORDER — ASPIRIN 81 MG PO TBEC
81.0000 mg | DELAYED_RELEASE_TABLET | Freq: Every day | ORAL | Status: DC
  Administered 2023-08-24 – 2023-08-26 (×3): 81 mg via ORAL
  Filled 2023-08-24 (×3): qty 1

## 2023-08-24 MED ORDER — SODIUM CHLORIDE 0.9 % IV SOLN
500.0000 mg | INTRAVENOUS | Status: DC
  Administered 2023-08-24 – 2023-08-26 (×2): 500 mg via INTRAVENOUS
  Filled 2023-08-24 (×2): qty 5

## 2023-08-24 MED ORDER — MAGNESIUM HYDROXIDE 400 MG/5ML PO SUSP: 30.0000 mL | Freq: Every day | ORAL | Status: DC | PRN

## 2023-08-24 MED ORDER — ALBUTEROL SULFATE HFA 108 (90 BASE) MCG/ACT IN AERS
2.0000 | INHALATION_SPRAY | Freq: Four times a day (QID) | RESPIRATORY_TRACT | Status: DC
  Administered 2023-08-24 – 2023-08-25 (×2): 2 via RESPIRATORY_TRACT
  Filled 2023-08-24: qty 6.7

## 2023-08-24 MED ORDER — HYDRALAZINE HCL 20 MG/ML IJ SOLN
5.0000 mg | Freq: Four times a day (QID) | INTRAMUSCULAR | Status: DC | PRN
  Administered 2023-08-24: 5 mg via INTRAVENOUS
  Filled 2023-08-24: qty 1

## 2023-08-24 MED ORDER — METHYLPREDNISOLONE SODIUM SUCC 40 MG IJ SOLR
40.0000 mg | Freq: Two times a day (BID) | INTRAMUSCULAR | Status: DC
  Administered 2023-08-24 – 2023-08-26 (×5): 40 mg via INTRAVENOUS
  Filled 2023-08-24 (×5): qty 1

## 2023-08-24 MED ORDER — LOSARTAN POTASSIUM 25 MG PO TABS
25.0000 mg | ORAL_TABLET | Freq: Every day | ORAL | Status: DC
  Administered 2023-08-24 – 2023-08-26 (×3): 25 mg via ORAL
  Filled 2023-08-24 (×3): qty 1

## 2023-08-24 MED ORDER — METOPROLOL TARTRATE 50 MG PO TABS
100.0000 mg | ORAL_TABLET | Freq: Two times a day (BID) | ORAL | Status: DC
  Administered 2023-08-24 – 2023-08-26 (×5): 100 mg via ORAL
  Filled 2023-08-24 (×5): qty 2

## 2023-08-24 MED ORDER — FLUTICASONE PROPIONATE HFA 44 MCG/ACT IN AERO
2.0000 | INHALATION_SPRAY | Freq: Two times a day (BID) | RESPIRATORY_TRACT | Status: DC
  Administered 2023-08-24 – 2023-08-25 (×2): 2 via RESPIRATORY_TRACT
  Filled 2023-08-24: qty 1

## 2023-08-24 MED ORDER — FUROSEMIDE 40 MG PO TABS
40.0000 mg | ORAL_TABLET | Freq: Every day | ORAL | Status: DC
  Administered 2023-08-25 – 2023-08-26 (×2): 40 mg via ORAL
  Filled 2023-08-24 (×3): qty 1

## 2023-08-24 MED ORDER — SODIUM CHLORIDE 0.9 % IV SOLN: INTRAVENOUS | Status: DC

## 2023-08-24 MED ORDER — DABIGATRAN ETEXILATE MESYLATE 150 MG PO CAPS
150.0000 mg | ORAL_CAPSULE | Freq: Two times a day (BID) | ORAL | Status: DC
  Administered 2023-08-24 – 2023-08-26 (×5): 150 mg via ORAL
  Filled 2023-08-24 (×5): qty 1

## 2023-08-24 MED ORDER — BUDESONIDE 0.25 MG/2ML IN SUSP: 0.2500 mg | Freq: Two times a day (BID) | RESPIRATORY_TRACT | Status: DC

## 2023-08-24 MED ORDER — IPRATROPIUM-ALBUTEROL 0.5-2.5 (3) MG/3ML IN SOLN
3.0000 mL | Freq: Once | RESPIRATORY_TRACT | Status: AC
  Administered 2023-08-24: 3 mL via RESPIRATORY_TRACT

## 2023-08-24 MED ORDER — ONDANSETRON HCL 4 MG/2ML IJ SOLN: 4.0000 mg | Freq: Four times a day (QID) | INTRAMUSCULAR | Status: DC | PRN

## 2023-08-24 MED ORDER — FOLIC ACID 1 MG PO TABS
2.0000 mg | ORAL_TABLET | Freq: Every day | ORAL | Status: DC
  Administered 2023-08-24 – 2023-08-26 (×3): 2 mg via ORAL
  Filled 2023-08-24 (×3): qty 2

## 2023-08-24 MED ORDER — ACETAMINOPHEN 325 MG PO TABS
650.0000 mg | ORAL_TABLET | Freq: Four times a day (QID) | ORAL | Status: DC | PRN
  Filled 2023-08-24: qty 2

## 2023-08-24 MED ORDER — PANTOPRAZOLE SODIUM 40 MG PO TBEC
40.0000 mg | DELAYED_RELEASE_TABLET | Freq: Every day | ORAL | Status: DC
  Administered 2023-08-24 – 2023-08-26 (×3): 40 mg via ORAL
  Filled 2023-08-24 (×3): qty 1

## 2023-08-24 MED ORDER — METHOTREXATE SODIUM 2.5 MG PO TABS
12.5000 mg | ORAL_TABLET | ORAL | Status: DC
  Administered 2023-08-26: 12.5 mg via ORAL
  Filled 2023-08-24: qty 5

## 2023-08-24 MED ORDER — TRAMADOL HCL 50 MG PO TABS
50.0000 mg | ORAL_TABLET | Freq: Four times a day (QID) | ORAL | Status: DC | PRN
  Administered 2023-08-24 – 2023-08-26 (×8): 50 mg via ORAL
  Filled 2023-08-24 (×8): qty 1

## 2023-08-24 MED ORDER — SODIUM CHLORIDE 0.9 % IV SOLN
2.0000 g | INTRAVENOUS | Status: DC
  Administered 2023-08-24 – 2023-08-25 (×2): 2 g via INTRAVENOUS
  Filled 2023-08-24 (×2): qty 20

## 2023-08-24 MED ORDER — GABAPENTIN 300 MG PO CAPS
600.0000 mg | ORAL_CAPSULE | Freq: Three times a day (TID) | ORAL | Status: DC
  Administered 2023-08-24 – 2023-08-26 (×6): 600 mg via ORAL
  Filled 2023-08-24 (×6): qty 2

## 2023-08-24 MED ORDER — CLOBETASOL PROPIONATE 0.05 % EX CREA
TOPICAL_CREAM | Freq: Two times a day (BID) | CUTANEOUS | Status: DC
  Filled 2023-08-24: qty 15

## 2023-08-24 MED ORDER — ONDANSETRON HCL 4 MG PO TABS: 4.0000 mg | ORAL_TABLET | Freq: Four times a day (QID) | ORAL | Status: DC | PRN

## 2023-08-24 MED ORDER — OXYCODONE-ACETAMINOPHEN 5-325 MG PO TABS
1.0000 | ORAL_TABLET | Freq: Once | ORAL | Status: AC
  Administered 2023-08-24: 1 via ORAL
  Filled 2023-08-24: qty 1

## 2023-08-24 MED ORDER — ACETAMINOPHEN 650 MG RE SUPP: 650.0000 mg | Freq: Four times a day (QID) | RECTAL | Status: DC | PRN

## 2023-08-24 MED ORDER — MELATONIN 5 MG PO TABS
5.0000 mg | ORAL_TABLET | Freq: Every day | ORAL | Status: DC
  Administered 2023-08-24 – 2023-08-25 (×2): 5 mg via ORAL
  Filled 2023-08-24 (×2): qty 1

## 2023-08-24 NOTE — Progress Notes (Signed)
Pt with increased work of breathing using accessory muscles and pursed lip breathing. Pulse ox was 94 on 3.5 L. Md notified fluids Dc. Continuing to monitor

## 2023-08-24 NOTE — Progress Notes (Signed)
Pt has combivent inhaler at bedside, refuses to have medicine sent to pharmacy. Pt educated on belongings/medicine policy.

## 2023-08-24 NOTE — Assessment & Plan Note (Signed)
- 

## 2023-08-24 NOTE — Assessment & Plan Note (Signed)
-   We will continue his Pradaxa and Cardizem CD as well as Lopressor.

## 2023-08-24 NOTE — Assessment & Plan Note (Signed)
-   We will continue statin therapy. 

## 2023-08-24 NOTE — Progress Notes (Signed)
Patient is seen and examined today morning.  He is sitting on edge of bed, has mild respiratory distress.  States that he uses 3L supplemental oxygen at home and requesting to bump it up to 3.5 L while sleeping, exertion.  Patient is currently being treated for atypical pneumonia possibly due to COVID-19.  Patient does have COPD, paroxysmal A-fib, CAD history.  I explained him that his oxygen saturations are greater than 95% and I would recommend titrating oxygen to around 92%.  He will be continued on antibiotics, supplemental oxygen, duoNebs. Patient and his wife understand and agree with the discharge plan.

## 2023-08-24 NOTE — Assessment & Plan Note (Signed)
-   We will continue his antihypertensive therapy. 

## 2023-08-24 NOTE — H&P (Signed)
[place]   PATIENT NAME: Darius Norris    MR#:  [id number]  DATE OF BIRTH:  06-21-1951  DATE OF ADMISSION:  08/23/2023  PRIMARY CARE PHYSICIAN: [facility]   Patient is coming from: Home  REQUESTING/REFERRING PHYSICIAN: Chiquita Loth, MD  CHIEF COMPLAINT:   Chief Complaint  Patient presents with   Shortness of Breath    HISTORY OF PRESENT ILLNESS:  Darius Norris is a 72 y.o. Caucasian male with medical history significant for COPD with chronic respiratory failure on home O2 at 3 L/min, coronary artery disease, atrial fibrillation, type diabetes mellitus, GERD and hypertension as well as coronary artery disease, who presented to the emergency room with acute onset of cold chills that started at 5 PM.  He has been having recent cough productive of green sputum as well as dyspnea and wheezing.  He denied any nausea or vomiting but has been having occasional abdominal pain with cough.  He had positive COVID-19 test about a week ago on 8/29.  He admitted to low-grade fever for 1 day that was up to 100, and admits to loss of taste and smell.  He denies diarrhea or melena or bright red being per rectum.  No chest pain or palpitations.  No dysuria, oliguria or hematuria or flank pain.  He denies any orthopnea or paroxysmal nocturnal dyspnea or worsening lower extremity edema.  He required increasing his home O2 to 4 L.  ED Course: When he came to the ER, BP was 158/66 and pulse extremity 93% on 3 L of O2 by nasal cannula with otherwise normal vital signs.  Labs revealed a calcium of 8.8, AST 71 ALT of 91 and blood glucose 142 with otherwise unremarkable CMP.  High sensitive troponin 9 with 8.  Lactic acid was 1.6.  CBC showed anemia with hemoglobin 10.7 hematocrit 33.7 compared to 12.2 and 39.8.  COVID-19 PCR was positive.  Blood cultures were drawn. EKG as reviewed by me : None Imaging: Two-view chest x-ray showed groundglass and interstitial opacities in the lower lungs  compatible with atypical infection and also showed emphysema.  The patient was given IV Rocephin and Zithromax as well as 2 DuoNebs and IV Solu-Medrol.  He will be admitted to a medical telemetry bed for further evaluation and management. PAST MEDICAL HISTORY:   Past Medical History:  Diagnosis Date   A-fib [facility])    CAD (coronary artery disease)    Colitis    COPD (chronic obstructive pulmonary disease) (HCC)    Diabetes mellitus without complication (HCC)    Emphysema lung (HCC)    GERD (gastroesophageal reflux disease)    Hypertension    MI (myocardial infarction) (HCC) 1999    PAST SURGICAL HISTORY:   Past Surgical History:  Procedure Laterality Date   ABDOMINAL HERNIA REPAIR Bilateral    CHOLECYSTECTOMY     COLONOSCOPY WITH PROPOFOL N/A 02/01/2018   Procedure: COLONOSCOPY WITH PROPOFOL;  Surgeon: Wyline Mood, MD;  Location: [facility] ENDOSCOPY;  Service: Gastroenterology;  Laterality: N/A;   KNEE ARTHROSCOPY Bilateral    LEFT HEART CATH AND CORONARY ANGIOGRAPHY N/A 01/03/2023   Procedure: LEFT HEART CATH AND CORONARY ANGIOGRAPHY and possible pci and stent;  Surgeon: Marcina Millard, MD;  Location: [facility] INVASIVE CV LAB;  Service: Cardiovascular;  Laterality: N/A;    SOCIAL HISTORY:   Social History   Tobacco Use   Smoking status: Former    Current packs/day: 0.00    Average packs/day: 1 pack/day for  30.0 years (30.0 ttl pk-yrs)    Types: Cigarettes    Start date: 12/19/1971    Quit date: 12/18/2001    Years since quitting: 21.6   Smokeless tobacco: Never  Substance Use Topics   Alcohol use: No    FAMILY HISTORY:   Family History  Problem Relation Age of Onset   CAD Father    Stroke Father    CAD Sister    Stroke Brother     DRUG ALLERGIES:   Allergies  Allergen Reactions   Zantac [Ranitidine Hcl] Shortness Of Breath    Eruption, HIVES, SHORTNESS OF BREATH, Eruption, HIVES, SHORTNESS OF BREATH    Molnupiravir Swelling   Codeine Itching    Other  reaction(s): PRURITIS   Erythromycin Hives    Other reaction(s): SWELLING (NON-SPECIFIC), HIVES, SWELLING (NON-SPECIFIC), HIVES   Morphine Itching   Tiotropium Bromide Monohydrate    Hydrocodone Rash    Other reaction(s): PRURITIS    REVIEW OF SYSTEMS:   ROS As per history of present illness. All pertinent systems were reviewed above. Constitutional, HEENT, cardiovascular, respiratory, GI, GU, musculoskeletal, neuro, psychiatric, endocrine, integumentary and hematologic systems were reviewed and are otherwise negative/unremarkable except for positive findings mentioned above in the HPI.   MEDICATIONS AT HOME:   Prior to Admission medications   Medication Sig Start Date End Date Taking? Authorizing Provider  acetaminophen (TYLENOL) 325 MG tablet Take 650 mg by mouth every 6 (six) hours as needed.    [provider]  albuterol (VENTOLIN HFA) 108 (90 Base) MCG/ACT inhaler Inhale into the lungs. 03/30/21   [provider]  aspirin EC 81 MG EC tablet Take 1 tablet (81 mg total) by mouth daily. 06/09/18   [name], [name], MD  atorvastatin (LIPITOR) 80 MG tablet Take 0.5 tablets (40 mg total) by mouth at bedtime. Half of an 28 01/04/23   Alford Highland, MD  busPIRone (BUSPAR) 10 MG tablet Take by mouth.    [provider]  clobetasol (TEMOVATE) 0.05 % external solution Apply topically. 02/28/21   [provider]  dabigatran (PRADAXA) 150 MG CAPS capsule Take 150 mg by mouth 2 (two) times daily.    [provider]  diltiazem (CARDIZEM CD) 300 MG 24 hr capsule Take 1 capsule (300 mg total) by mouth daily. 01/04/23 02/03/23  Alford Highland, MD  furosemide (LASIX) 40 MG tablet Take 1 tablet (40 mg total) by mouth daily. 05/24/22 01/02/23  Darlin Priestly, MD  gabapentin (NEURONTIN) 300 MG capsule Take 1 capsule by mouth 3 (three) times daily as needed. 10/10/21   [provider]  hydrocortisone 2.5 % cream Apply topically. 02/28/21   [provider]  isosorbide mononitrate (IMDUR) 60 MG 24 hr tablet Take 60 mg by mouth daily.    [provider]  lidocaine (LIDODERM) 5 % Place 1 patch onto the skin daily. Remove & Discard patch within 12 hours or as directed by MD    [provider]  loratadine (CLARITIN) 10 MG tablet Take 10 mg by mouth daily as needed for allergies.     [provider]  Melatonin 3 MG CAPS Take 2 capsules by mouth at bedtime. 10/10/21   [provider]  methotrexate (RHEUMATREX) 2.5 MG tablet Take 10 mg by mouth once a week. 03/02/21   [provider]  metoprolol tartrate (LOPRESSOR) 100 MG tablet Take 1 tablet (100 mg total) by mouth 2 (two) times daily. 05/24/22 01/02/23  Darlin Priestly, MD  mometasone [facility]) 220 MCG/INH  inhaler Take by mouth. 03/10/21   [provider]  omeprazole (PRILOSEC) 20 MG capsule Take 20 mg by mouth daily.    [provider]  senna (SENOKOT) 8.6 MG TABS tablet Take 1 tablet by mouth daily as needed for mild constipation.    [provider]  simethicone (MYLICON) 80 MG chewable tablet Chew 80 mg by mouth 4 (four) times daily as needed for flatulence. 03/04/22   [provider]  Tiotropium Bromide-Olodaterol (STIOLTO RESPIMAT) 2.5-2.5 MCG/ACT AERS Inhale 2 each into the lungs 2 (two) times daily.    [provider]  traMADol (ULTRAM) 50 MG tablet Take 1 tablet (50 mg total) by mouth every 6 (six) hours as needed. 02/28/23 02/28/24  Willy Eddy, MD      VITAL SIGNS:  Blood pressure (!) 159/72, pulse 85, temperature 97.6 F (36.4 C), resp. rate 19, height 5\' 6"  (1.676 m), weight 83.9 kg, SpO2 97%.  PHYSICAL EXAMINATION:  Physical Exam  GENERAL:  72 y.o.-year-old patient lying in the bed with no acute distress.  EYES: Pupils equal, round, reactive to light and accommodation. No scleral icterus. Extraocular muscles intact.  HEENT: Head atraumatic, normocephalic. Oropharynx and nasopharynx clear.  NECK:   Supple, no jugular venous distention. No thyroid enlargement, no tenderness.  LUNGS: Diminished bibasilar breath sounds with bibasal crackles.  No use of accessory muscles of respiration.  CARDIOVASCULAR: Regular rate and rhythm, S1, S2 normal. No murmurs, rubs, or gallops.  ABDOMEN: Soft, nondistended, nontender. Bowel sounds present. No organomegaly or mass.  EXTREMITIES: No pedal edema, cyanosis, or clubbing.  NEUROLOGIC: Cranial nerves II through XII are intact. Muscle strength 5/5 in all extremities. Sensation intact. Gait not checked.  PSYCHIATRIC: The patient is alert and oriented x 3.  Normal affect and good eye contact. SKIN: No obvious rash, lesion, or ulcer.   LABORATORY PANEL:   CBC Recent Labs  Lab 08/23/23 2136  WBC 5.6  HGB 10.7*  HCT 33.7*  PLT 201   ------------------------------------------------------------------------------------------------------------------  Chemistries  Recent Labs  Lab 08/23/23 2136  NA 139  K 3.7  CL 101  CO2 27  GLUCOSE 142*  BUN 9  CREATININE 0.86  CALCIUM 8.8*  AST 71*  ALT 91*  ALKPHOS 76  BILITOT 0.8   ------------------------------------------------------------------------------------------------------------------  Cardiac Enzymes No results for input(s): "TROPONINI" in the last 168 hours. ------------------------------------------------------------------------------------------------------------------  RADIOLOGY:  DG Chest 2 View  Result Date: 08/23/2023 CLINICAL DATA:  Shortness of breath, tested positive for COVID EXAM: CHEST - 2 VIEW COMPARISON:  Radiograph 12/23/2022 FINDINGS: Stable cardiomediastinal silhouette. Aortic atherosclerotic calcification. Emphysema. Interstitial prominence and ground-glass opacities in the lower lungs has increased from 01/02/2023 suspicious for atypical infection. IMPRESSION: 1. Ground-glass and interstitial opacities in the lower lungs compatible with atypical infection. 2. Emphysema.  Electronically Signed   By: Minerva Fester M.D.   On: 08/23/2023 22:25      IMPRESSION AND PLAN:  Assessment and Plan: * Atypical pneumonia - The patient will be admitted to a medical telemetry bed. - Will continue antibiotic therapy with IV Rocephin and Zithromax. - Mucolytic therapy be provided as well as duo nebs q.i.d. and q.4 hours p.r.n. - We will follow blood cultures. -This could certainly be secondary atypical bacterial pneumonia after COVID-19. - I do not believe the patient will benefit from antiviral therapy at this time.  Coronary artery disease - We will continue his aspirin, statin therapy, beta-blocker therapy and Imdur.  GERD without esophagitis - We will continue his PPI therapy.  Paroxysmal atrial fibrillation (HCC) - We will continue his Pradaxa and Cardizem CD as well as Lopressor.  Dyslipidemia - We will continue statin therapy  Essential hypertension - We will continue his antihypertensive therapy.    DVT prophylaxis: Pradaxa..  Advanced Care Planning:  Code Status: full code.  Family Communication:  The plan of care was discussed in details with the patient (and family). I answered all questions. The patient agreed to proceed with the above mentioned plan. Further management will depend upon hospital course. Disposition Plan: Back to previous home environment Consults called: none.  All the records are reviewed and case discussed with ED provider.  Status is: Inpatient  At the time of the admission, it appears that the appropriate admission status for this patient is inpatient.  This is judged to be reasonable and necessary in order to provide the required intensity of service to ensure the patient's safety given the presenting symptoms, physical exam findings and initial radiographic and laboratory data in the context of comorbid conditions.  The patient requires inpatient status due to high intensity of service, high risk of further deterioration  and high frequency of surveillance required.  I certify that at the time of admission, it is my clinical judgment that the patient will require inpatient hospital care extending more than 2 midnights.                            Dispo: The patient is from: Home              Anticipated d/c is to: Home              Patient currently is not medically stable to d/c.              Difficult to place patient: No  Hannah Beat M.D on 08/24/2023 at 6:23 AM  [facility]   From 7 PM-7 AM, contact night-coverage [email or web address]  CC: Primary care physician; [facility]

## 2023-08-24 NOTE — Assessment & Plan Note (Signed)
-   The patient will be admitted to a medical telemetry bed. - Will continue antibiotic therapy with IV Rocephin and Zithromax. - Mucolytic therapy be provided as well as duo nebs q.i.d. and q.4 hours p.r.n. - We will follow blood cultures. -This could certainly be secondary atypical bacterial pneumonia after COVID-19. - I do not believe the patient will benefit from antiviral therapy at this time.

## 2023-08-24 NOTE — Assessment & Plan Note (Signed)
-   We will continue his aspirin, statin therapy, beta-blocker therapy and Imdur.

## 2023-08-25 DIAGNOSIS — K219 Gastro-esophageal reflux disease without esophagitis: Secondary | ICD-10-CM

## 2023-08-25 DIAGNOSIS — I5032 Chronic diastolic (congestive) heart failure: Secondary | ICD-10-CM | POA: Diagnosis not present

## 2023-08-25 DIAGNOSIS — J441 Chronic obstructive pulmonary disease with (acute) exacerbation: Secondary | ICD-10-CM | POA: Diagnosis not present

## 2023-08-25 DIAGNOSIS — E785 Hyperlipidemia, unspecified: Secondary | ICD-10-CM | POA: Diagnosis not present

## 2023-08-25 DIAGNOSIS — J189 Pneumonia, unspecified organism: Secondary | ICD-10-CM | POA: Diagnosis not present

## 2023-08-25 MED ORDER — EMPAGLIFLOZIN 25 MG PO TABS
25.0000 mg | ORAL_TABLET | Freq: Every day | ORAL | Status: DC
  Administered 2023-08-25 – 2023-08-26 (×2): 25 mg via ORAL
  Filled 2023-08-25 (×2): qty 1

## 2023-08-25 NOTE — Progress Notes (Signed)
Transition of Care [facility]) - Inpatient Brief Assessment   Patient Details  Name: Darius Norris MRN: [id number] Date of Birth: 1951/05/06  Transition of Care [facility]) CM/SW Contact:    Liliana Cline, LCSW Phone Number: 08/25/2023, 9:04 AM   Clinical Narrative:    Transition of Care Asessment: Insurance and Status: Insurance coverage has been reviewed Patient has primary care physician: Yes     Prior/Current Home Services: No current home services Social Determinants of Health Reivew: SDOH reviewed no interventions necessary Readmission risk has been reviewed: Yes Transition of care needs: no transition of care needs at this time

## 2023-08-25 NOTE — Progress Notes (Signed)
OT Cancellation Note  Patient Details Name: Darius Norris MRN: [id number] DOB: [birth year]   Cancelled Treatment:    Reason Eval/Treat Not Completed: OT screened, no needs identified, will sign off. Order received, chart reviewed. Per chart pt demonstrating no functional deficits indicating OT evaluation at this time. No skilled OT needs identified. Will sign off. Please re-consult if additional needs arise.    Arman Filter., MPH, MS, OTR/L ascom [phone] 08/25/23, 4:19 PM

## 2023-08-25 NOTE — Progress Notes (Signed)
Progress Note   Patient: Darius Norris:[id number] DOB: 1951/04/18 DOA: 08/23/2023     1 DOS: the patient was seen and examined on 08/25/2023   Brief hospital course: Darius Norris is a 72 y.o. Caucasian male with medical history significant for COPD with chronic respiratory failure on home O2 at 3 L/min, coronary artery disease, atrial fibrillation, type diabetes mellitus, GERD and hypertension as well as coronary artery disease, who presented to the emergency room with acute onset of cold chills that started at 5 PM.  He has been having recent cough productive of green sputum as well as dyspnea and wheezing.  He denied any nausea or vomiting but has been having occasional abdominal pain with cough.  He had positive COVID-19 test about a week ago on 8/29.   Assessment and Plan: * Atypical pneumonia Continue IV Rocephin and Zithromax. Mucolytic therapy be provided as well as duo nebs q.i.d. and q.4 hours p.r.n. Follow blood cultures. High possible secondary atypical bacterial pneumonia after COVID infection. No antiviral therapy recommended at this time given 2 weeks of being Covid positive.  COPD exacerbation- Continue Duonebs. IV solumedrol 40 mg q12 with oral taper. Continue supplemental oxygen to maintain saturation around 92 %.  Diastolic CHF- No exacerbation. He is euvolemic. Continue home dose Lasix 40 mg daily. Monitor daily weight, strict ins and output.  Coronary artery disease Continue aspirin, statin therapy, beta-blocker therapy and Imdur.  GERD without esophagitis Continue PPI therapy.  Paroxysmal atrial fibrillation (HCC) On Pradaxa and Cardizem CD as well as Lopressor.  Dyslipidemia Continue statin therapy  Essential hypertension Continue Lopressor, imdur. BP stable.        Subjective: Patient is seen and examined today morning, feels short of breath even with mild exertion. Eating better. No cough. Had rough night.   Physical Exam: Vitals:    08/24/23 1710 08/25/23 0017 08/25/23 0424 08/25/23 0843  BP: (!) 157/75 (!) 148/89 (!) 152/72 (!) 150/74  Pulse: 88 69 78 80  Resp: 20   18  Temp:  (!) 97.5 F (36.4 C) 97.7 F (36.5 C) 98 F (36.7 C)  TempSrc:      SpO2: 98% 91% 96% 100%  Weight:      Height:       General - Elderly Caucasian male, mild respiratory distress HEENT - PERRLA, EOMI, atraumatic head, non tender sinuses. Lung -decreased breath sounds, diffuse wheezes, using accessory muscles with exertion. Heart - S1, S2 heard, no murmurs, rubs, trace pedal edema Neuro - Alert, awake and oriented x 3, non focal exam. Skin - Warm and dry. Data Reviewed:     Latest Ref Rng & Units 08/24/2023    5:54 AM 08/23/2023    9:36 PM 02/28/2023    9:54 AM  CBC  WBC 4.0 - 10.5 K/uL 3.6  5.6  10.6   Hemoglobin 13.0 - 17.0 g/dL 9.7  81.1  91.4   Hematocrit 39.0 - 52.0 % 29.8  33.7  39.8   Platelets 150 - 400 K/uL 168  201  316       Latest Ref Rng & Units 08/24/2023    5:54 AM 08/23/2023    9:36 PM 02/28/2023    9:54 AM  BMP  Glucose 70 - 99 mg/dL 782  956  213   BUN 8 - 23 mg/dL 8  9  10    Creatinine 0.61 - 1.24 mg/dL 0.86  5.78  4.69   Sodium 135 - 145 mmol/L 139  139  137  Potassium 3.5 - 5.1 mmol/L 4.0  3.7  4.1   Chloride 98 - 111 mmol/L 106  101  105   CO2 22 - 32 mmol/L 27  27  27    Calcium 8.9 - 10.3 mg/dL 8.5  8.8  8.4    DG Chest 2 View  Result Date: 08/23/2023 CLINICAL DATA:  Shortness of breath, tested positive for COVID EXAM: CHEST - 2 VIEW COMPARISON:  Radiograph 12/23/2022 FINDINGS: Stable cardiomediastinal silhouette. Aortic atherosclerotic calcification. Emphysema. Interstitial prominence and ground-glass opacities in the lower lungs has increased from 01/02/2023 suspicious for atypical infection. IMPRESSION: 1. Ground-glass and interstitial opacities in the lower lungs compatible with atypical infection. 2. Emphysema. Electronically Signed   By: Minerva Fester M.D.   On: 08/23/2023 22:25     Family  Communication: Patient's wife at bedside, understand and agree with current plan.  Disposition: Status is: Inpatient Remains inpatient appropriate because: short of breath with mild exertion, pneumonia, COPD flare up.  Planned Discharge Destination: Home with Home Health    Time spent: 42 minutes  Author: Marcelino Duster, MD 08/25/2023 12:02 PM  For on call review [email or web address].

## 2023-08-26 DIAGNOSIS — J189 Pneumonia, unspecified organism: Secondary | ICD-10-CM | POA: Diagnosis not present

## 2023-08-26 DIAGNOSIS — K219 Gastro-esophageal reflux disease without esophagitis: Secondary | ICD-10-CM | POA: Diagnosis not present

## 2023-08-26 DIAGNOSIS — I10 Essential (primary) hypertension: Secondary | ICD-10-CM | POA: Diagnosis not present

## 2023-08-26 DIAGNOSIS — E785 Hyperlipidemia, unspecified: Secondary | ICD-10-CM | POA: Diagnosis not present

## 2023-08-26 LAB — GLUCOSE, CAPILLARY
Glucose-Capillary: 136 mg/dL — ABNORMAL HIGH (ref 70–99)
Glucose-Capillary: 126 mg/dL — ABNORMAL HIGH (ref 70–99)

## 2023-08-26 MED ORDER — AZITHROMYCIN 500 MG PO TABS: 500.0000 mg | ORAL_TABLET | Freq: Every day | ORAL | Status: DC

## 2023-08-26 MED ORDER — PREDNISONE 20 MG PO TABS: 40.0000 mg | ORAL_TABLET | Freq: Every day | ORAL | Status: DC

## 2023-08-26 MED ORDER — PREDNISONE 10 MG PO TABS: ORAL_TABLET | ORAL | 0 refills | Status: AC

## 2023-08-26 MED ORDER — AMOXICILLIN-POT CLAVULANATE 500-125 MG PO TABS: 1.0000 | ORAL_TABLET | Freq: Two times a day (BID) | ORAL | 0 refills | Status: AC

## 2023-08-26 NOTE — Progress Notes (Signed)
PHARMACIST - PHYSICIAN COMMUNICATION  CONCERNING: Antibiotic IV to Oral Route Change Policy  RECOMMENDATION: This patient is receiving azithromycin by the intravenous route.  Based on criteria approved by the Pharmacy and Therapeutics Committee, the antibiotic(s) is/are being converted to the equivalent oral dose form(s).  DESCRIPTION: These criteria include: Patient being treated for a respiratory tract infection, urinary tract infection, cellulitis or clostridium difficile associated diarrhea if on metronidazole The patient is not neutropenic and does not exhibit a GI malabsorption state The patient is eating (either orally or via tube) and/or has been taking other orally administered medications for a least 24 hours The patient is improving clinically and has a Tmax < 100.5  If you have questions about this conversion, please contact the Pharmacy Department   Tressie Ellis 08/26/23

## 2023-08-26 NOTE — Evaluation (Signed)
Physical Therapy Evaluation Patient Details Name: Darius Norris MRN: [id number] DOB: [birth year] Today's Date: 08/26/2023  History of Present Illness  Pt is a 72 y/o M admitted on 08/23/23 after presenting with c/o cold chills, cough, dyspnea & wheezing. Pt tested positive for COVID-19 on 08/16/23. Pt is being treated for atypical PNA. 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 Pt reports prior to admission he was independent in the home without AD, used a power scooter outside of the home. On this date, pt attempts gait without AD but constantly holds to furniture in room so provided pt with RW & pt ambulates with supervision with improved steadiness. PT educated pt on recommendation to use RW at d/c & pt agreeable. Pt inquiring about pulmonary rehab & Tsosie Billing - MD & case manager made aware. Will continue to follow pt acutely to progress gait with LRAD, balance, & strength & endurance training.        If plan is discharge home, recommend the following: A little help with walking and/or transfers;A little help with bathing/dressing/bathroom;Direct supervision/assist for financial management;Assist for transportation;Help with stairs or ramp for entrance   Can travel by private vehicle        Equipment Recommendations None recommended by PT  Recommendations for Other Services       Functional Status Assessment Patient has had a recent decline in their functional status and demonstrates the ability to make significant improvements in function in a reasonable and predictable amount of time.     Precautions / Restrictions Precautions Precautions: Fall Restrictions Weight Bearing Restrictions: No      Mobility  Bed Mobility               General bed mobility comments: not tested, pt received & left sitting EOB    Transfers Overall transfer level: Needs  assistance Equipment used: None Transfers: Sit to/from Stand Sit to Stand: Contact guard assist, Supervision           General transfer comment: STS from EOB without AD, STS with RW & supervision (education re: hand placement)    Ambulation/Gait Ambulation/Gait assistance: Contact guard assist, Min assist, Supervision Gait Distance (Feet):  (3 ft + 45 ft) Assistive device: None, Rolling walker (2 wheels)   Gait velocity: decreased     General Gait Details: Pt attempts ambulation without AD but constantly reaching for objects for support, CGA<>min assist for balance so provided pt with RW & pt able to ambulate laps in room with RW & supervision with fair balance.  Stairs            Wheelchair Mobility     Tilt Bed    Modified Rankin (Stroke Patients Only)       Balance Overall balance assessment: Needs assistance Sitting-balance support: Feet supported Sitting balance-Leahy Scale: Good     Standing balance support: During functional activity, Bilateral upper extremity supported, No upper extremity supported Standing balance-Leahy Scale: Fair                               Pertinent Vitals/Pain Pain Assessment Pain Assessment: Faces Faces Pain Scale: Hurts even more Pain Location: neck, shoulder & back pain Pain Descriptors / Indicators: Discomfort, Aching Pain Intervention(s): Monitored during session, Premedicated before session    Home Living Family/patient expects to be discharged  to:: Private residence Living Arrangements: Spouse/significant other Available Help at Discharge: Family Type of Home: House Home Access: Ramped entrance       Home Layout: One level Home Equipment: Agricultural consultant (2 wheels);Rollator (4 wheels);Other (comment) (power scooter)      Prior Function Prior Level of Function : Independent/Modified Independent             Mobility Comments: Pt reports he ambulates household distances without AD, uses power  scooter outside of the home, notes a few falls in the past year 2/2 tripping over O2 tubing.       Extremity/Trunk Assessment   Upper Extremity Assessment Upper Extremity Assessment: Overall WFL for tasks assessed    Lower Extremity Assessment Lower Extremity Assessment: Generalized weakness       Communication      Cognition Arousal: Alert Behavior During Therapy: WFL for tasks assessed/performed Overall Cognitive Status: Within Functional Limits for tasks assessed                                          General Comments General comments (skin integrity, edema, etc.): Pt on 4L/min via nasal cannula with SpO2 >/= 90%    Exercises     Assessment/Plan    PT Assessment Patient needs continued PT services  PT Problem List Decreased strength;Cardiopulmonary status limiting activity;Decreased activity tolerance;Decreased balance;Decreased mobility       PT Treatment Interventions DME instruction;Balance training;Neuromuscular re-education;Stair training;Gait training;Functional mobility training;Therapeutic activities;Therapeutic exercise;Patient/family education    PT Goals (Current goals can be found in the Care Plan section)  Acute Rehab PT Goals Patient Stated Goal: go home PT Goal Formulation: With patient Time For Goal Achievement: 09/09/23 Potential to Achieve Goals: Good    Frequency Min 1X/week     Co-evaluation               AM-PAC PT "6 Clicks" Mobility  Outcome Measure Help needed turning from your back to your side while in a flat bed without using bedrails?: None Help needed moving from lying on your back to sitting on the side of a flat bed without using bedrails?: None Help needed moving to and from a bed to a chair (including a wheelchair)?: A Little Help needed standing up from a chair using your arms (e.g., wheelchair or bedside chair)?: A Little Help needed to walk in hospital room?: A Little Help needed climbing 3-5  steps with a railing? : A Little 6 Click Score: 20    End of Session Equipment Utilized During Treatment: Oxygen Activity Tolerance: Patient tolerated treatment well Patient left: in bed;with call bell/phone within reach;with bed alarm set Nurse Communication: Mobility status PT Visit Diagnosis: Muscle weakness (generalized) (M62.81);Unsteadiness on feet (R26.81)    Time: 4098-1191 PT Time Calculation (min) (ACUTE ONLY): 16 min   Charges:   PT Evaluation $PT Eval Low Complexity: 1 Low   PT General Charges $$ ACUTE PT VISIT: 1 Visit         Aleda Grana, PT, DPT 08/26/23, 12:23 PM   Sandi Mariscal 08/26/2023, 12:18 PM

## 2023-08-26 NOTE — TOC Initial Note (Signed)
Transition of Care [facility]) - Initial/Assessment Note    Patient Details  Name: Darius Norris MRN: [id number] Date of Birth: 11-Jun-1951  [facility] CM/SW Contact:    Liliana Cline, LCSW Phone Number: 08/26/2023, 12:37 PM  Clinical Narrative:                 Spoke with patient by phone due to isolation.  Patient is from home with his wife who will provide transportation home today. PCP is [facility]. Has DME at home. Patient requested information on the [employer] program - information printed and provided for patient.  PT recs Home Health - patient is agreeable. Confirmed home address. Referral made to [facility] with [facility]. Encrypted emailed [name] with [facility] to inform her of HH need.    Expected Discharge Plan: Home w Home Health Services Barriers to Discharge: Barriers Resolved   Patient Goals and CMS Choice   CMS Medicare.gov Compare Post Acute Care list provided to:: Patient Choice offered to / list presented to : Patient      Expected Discharge Plan and Services       Living arrangements for the past 2 months: Single Family Home Expected Discharge Date: 08/26/23                         HH Arranged: PT, OT HH Agency: [facility] Date [facility] Agency Contacted: 08/26/23   Representative spoke with at [facility] Agency: Kandee Keen  Prior Living Arrangements/Services Living arrangements for the past 2 months: Single Family Home Lives with:: Spouse Patient language and need for interpreter reviewed:: Yes Do you feel safe going back to the place where you live?: Yes      Need for Family Participation in Patient Care: Yes (Comment) Care giver support system in place?: Yes (comment) Current home services: DME Criminal Activity/Legal Involvement Pertinent to Current Situation/Hospitalization: No - Comment as needed  Activities of Daily Living Home Assistive Devices/Equipment: Dan Humphreys (specify type) ADL Screening (condition at time of  admission) Patient's cognitive ability adequate to safely complete daily activities?: Yes Is the patient deaf or have difficulty hearing?: No Does the patient have difficulty seeing, even when wearing glasses/contacts?: No Does the patient have difficulty concentrating, remembering, or making decisions?: No Patient able to express need for assistance with ADLs?: Yes Does the patient have difficulty dressing or bathing?: No Independently performs ADLs?: No Communication: Needs assistance Dressing (OT): Needs assistance Feeding: Independent Bathing: Needs assistance Toileting: Independent with device (comment) In/Out Bed: Independent with device (comment) Walks in Home: Independent with device (comment) Does the patient have difficulty walking or climbing stairs?: Yes Weakness of Legs: Both Weakness of Arms/Hands: None  Permission Sought/Granted Permission sought to share information with : Oceanographer granted to share information with : Yes, Verbal Permission Granted              Emotional Assessment       Orientation: : Oriented to Situation, Oriented to Self, Oriented to Place, Oriented to  Time Alcohol / Substance Use: Not Applicable Psych Involvement: No (comment)  Admission diagnosis:  Shortness of breath [R06.02] Hypoxia [R09.02] Atypical pneumonia [J18.9] COPD exacerbation (HCC) [J44.1] Community acquired pneumonia, unspecified laterality [J18.9] Pneumonia due to COVID-19 virus [U07.1, J12.82] Patient Active Problem List   Diagnosis Date Noted   Atypical pneumonia 08/24/2023   Dyslipidemia 08/24/2023   Paroxysmal atrial fibrillation (HCC) 08/24/2023   GERD without esophagitis 08/24/2023   Coronary artery  disease 08/24/2023   Myocardial injury 01/03/2023   Type 2 diabetes mellitus with hyperlipidemia (HCC) 01/03/2023   Obesity (BMI 30-39.9) 01/03/2023   Aspiration pneumonia (HCC) 05/19/2022   Severe sepsis (HCC) 05/19/2022    Diarrhea 05/19/2022   Chronic diastolic CHF (congestive heart failure) (HCC) 05/17/2022   Paroxysmal atrial fibrillation with RVR (HCC) 05/16/2022   Rheumatoid arthritis (HCC) 05/16/2022   Age-related nuclear cataract, bilateral 02/08/2022   Cortical age-related cataract, right eye 02/08/2022   Encounter for observation for other suspected diseases and conditions ruled out 02/08/2022   COVID-19 02/08/2022   Diverticulitis of colon 02/08/2022   Dysphagia, oropharyngeal phase 02/08/2022   Encounter for surgical aftercare following surgery on the sense organs 02/08/2022   Hypertensive heart disease without heart failure 02/08/2022   Oral lesion 02/08/2022   Other age-related cataract 02/08/2022   Other B-complex deficiencies 02/08/2022   Other bursitis of elbow, right elbow 02/08/2022   Postnasal drip 02/08/2022   Rheumatism and fibrositis 02/08/2022   Tinnitus 02/08/2022   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) 02/08/2022   Atrial fibrillation with RVR (HCC) 12/05/2021   HLD (hyperlipidemia) 12/05/2021   COPD (chronic obstructive pulmonary disease) (HCC) 12/05/2021   Depression with anxiety 12/05/2021   Psoriasis 12/05/2021   Carotid atherosclerosis, bilateral 06/04/2020   Chronic hypoxic respiratory failure (HCC) 05/28/2018   A-fib (HCC) 05/02/2018   Chest pain 11/17/2017   Unstable angina (HCC) 09/04/2016   Essential hypertension 09/04/2016   Hypokalemia 09/04/2016   Leukocytosis 09/04/2016   COPD with acute exacerbation (HCC) 12/15/2015   Elevated troponin 12/15/2015   HTN (hypertension) 12/15/2015   GERD (gastroesophageal reflux disease) 12/15/2015   CAD (coronary artery disease) 12/15/2015   Angina pectoris (HCC) 12/15/2015   Benign neoplasm of colon 12/18/2001   Migraine 12/18/2000   Nephritis and nephropathy, 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 [place] [address] Phone: [phone] Fax: [phone]     Social Determinants of Health (SDOH) Social History: SDOH Screenings   Food Insecurity: No Food Insecurity (08/24/2023)  Housing: Low Risk  (08/24/2023)  Transportation Needs: No Transportation Needs (08/24/2023)  Utilities: Not At Risk (08/24/2023)  Depression (PHQ2-9): Medium Risk (11/29/2022)  Tobacco Use: Medium Risk (08/23/2023)   SDOH Interventions:     Readmission Risk Interventions    08/26/2023   12:35 PM  Readmission Risk Prevention Plan  Transportation Screening Complete  PCP or Specialist Appt within 5-7 Days Complete  Home Care Screening Complete  Medication Review (RN CM) Complete

## 2023-08-26 NOTE — Discharge Summary (Signed)
Physician Discharge Summary   Patient: Darius Norris MRN: [id number] DOB: 08-Feb-1951  Admit date:     08/23/2023  Discharge date: 08/26/23  Discharge Physician: Marcelino Duster   PCP: [facility]   Recommendations at discharge:  {Tip this will not be part of the note when signed- Example include specific recommendations for outpatient follow-up, pending tests to follow-up on. (Optional):26781}  ***  Discharge Diagnoses: Principal Problem:   Atypical pneumonia Active Problems:   Essential hypertension   Dyslipidemia   Paroxysmal atrial fibrillation (HCC)   GERD without esophagitis   Coronary artery disease  Resolved Problems:   * No resolved hospital problems. [facility] Course: No notes on file  Assessment and Plan: * Atypical pneumonia - The patient will be admitted to a medical telemetry bed. - Will continue antibiotic therapy with IV Rocephin and Zithromax. - Mucolytic therapy be provided as well as duo nebs q.i.d. and q.4 hours p.r.n. - We will follow blood cultures. -This could certainly be secondary atypical bacterial pneumonia after COVID-19. - I do not believe the patient will benefit from antiviral therapy at this time.  Coronary artery disease - We will continue his aspirin, statin therapy, beta-blocker therapy and Imdur.  GERD without esophagitis - We will continue his PPI therapy.  Paroxysmal atrial fibrillation (HCC) - We will continue his Pradaxa and Cardizem CD as well as Lopressor.  Dyslipidemia - We will continue statin therapy  Essential hypertension - We will continue his antihypertensive therapy.      {Tip this will not be part of the note when signed Body mass index is 29.86 kg/m. , ,  (Optional):26781}  {(NOTE) Pain control PDMP Statment (Optional):26782} Consultants: *** Procedures performed: ***  Disposition: {Plan; Disposition:26390} Diet recommendation:  Discharge Diet Orders (From admission, onward)      Start     Ordered   08/26/23 0000  Diet - low sodium heart healthy        08/26/23 1236           {Diet_Plan:26776} DISCHARGE MEDICATION: Allergies as of 08/26/2023       Reactions   Zantac [ranitidine Hcl] Shortness Of Breath   Eruption, HIVES, SHORTNESS OF BREATH, Eruption, HIVES, SHORTNESS OF BREATH   Molnupiravir Swelling   Codeine Itching   Other reaction(s): PRURITIS   Erythromycin Hives   Other reaction(s): SWELLING (NON-SPECIFIC), HIVES, SWELLING (NON-SPECIFIC), HIVES   Morphine Itching   Tiotropium Bromide Monohydrate    Hydrocodone Rash   Other reaction(s): PRURITIS        Medication List     STOP taking these medications    doxycycline 100 MG tablet Commonly known as: VIBRA-TABS       TAKE these medications    acetaminophen 325 MG tablet Commonly known as: TYLENOL Take 975 mg by mouth 3 (three) times daily.   albuterol 108 (90 Base) MCG/ACT inhaler Commonly known as: VENTOLIN HFA Inhale into the lungs.   amoxicillin-clavulanate 500-125 MG tablet Commonly known as: Augmentin Take 1 tablet by mouth in the morning and at bedtime for 5 days.   aspirin EC 81 MG tablet Take 1 tablet (81 mg total) by mouth daily.   atorvastatin 80 MG tablet Commonly known as: LIPITOR Take 0.5 tablets (40 mg total) by mouth at bedtime. Half of an 80   busPIRone 10 MG tablet Commonly known as: BUSPAR Take 10 mg by mouth 2 (two) times daily.   clobetasol 0.05 % external solution Commonly known as: TEMOVATE Apply topically.  dabigatran 150 MG Caps capsule Commonly known as: PRADAXA Take 150 mg by mouth 2 (two) times daily.   diltiazem 300 MG 24 hr capsule Commonly known as: CARDIZEM CD Take 1 capsule (300 mg total) by mouth daily.   empagliflozin 25 MG Tabs tablet Commonly known as: JARDIANCE Take 25 mg by mouth daily. Take 0.5 tablet (12.5 mg) by mouth once daily   folic acid 1 MG tablet Commonly known as: FOLVITE Take 2 mg by mouth daily. Hold on  Sunday when taking Methotrexate   furosemide 40 MG tablet Commonly known as: LASIX Take 1 tablet (40 mg total) by mouth daily. What changed: additional instructions   gabapentin 300 MG capsule Commonly known as: NEURONTIN Take 600 mg by mouth 3 (three) times daily.   hydrocortisone 2.5 % cream Apply topically.   isosorbide mononitrate 60 MG 24 hr tablet Commonly known as: IMDUR Take 60 mg by mouth daily.   lidocaine 5 % Commonly known as: LIDODERM Place 1 patch onto the skin daily. Remove & Discard patch within 12 hours or as directed by MD   loratadine 10 MG tablet Commonly known as: CLARITIN Take 10 mg by mouth daily as needed for allergies. Take 0.5 tablet (5 mg) by mouth once daily as needed for allergies   losartan 25 MG tablet Commonly known as: COZAAR Take 25 mg by mouth daily.   magnesium oxide 400 MG tablet Commonly known as: MAG-OX Take 800 mg by mouth daily.   Melatonin 3 MG Caps Take 2 capsules by mouth at bedtime.   metFORMIN 500 MG tablet Commonly known as: GLUCOPHAGE Take 500 mg by mouth 2 (two) times daily with a meal.   methotrexate 2.5 MG tablet Commonly known as: RHEUMATREX Take 12.5 mg by mouth once a week. Take 12.5 mg by mouth once weekly (on Sundays)   metoprolol tartrate 100 MG tablet Commonly known as: LOPRESSOR Take 1 tablet (100 mg total) by mouth 2 (two) times daily.   Mometasone Furoate 100 MCG/ACT Aero Inhale 2 puffs into the lungs 2 (two) times daily.   omeprazole 20 MG capsule Commonly known as: PRILOSEC Take 20 mg by mouth daily.   oxymetazoline 0.05 % nasal spray Commonly known as: AFRIN Place 1 spray into both nostrils daily.   potassium chloride SA 20 MEQ tablet Commonly known as: KLOR-CON M Take 20 mEq by mouth daily as needed (With Furosemide).   predniSONE 10 MG tablet Commonly known as: DELTASONE Take 4 tablets (40 mg total) by mouth daily with breakfast for 2 days, THEN 3 tablets (30 mg total) daily with  breakfast for 2 days, THEN 2 tablets (20 mg total) daily with breakfast for 2 days, THEN 1 tablet (10 mg total) daily with breakfast for 2 days. Start taking on: August 27, 2023   senna 8.6 MG Tabs tablet Commonly known as: SENOKOT Take 1 tablet by mouth daily as needed for mild constipation.   simethicone 80 MG chewable tablet Commonly known as: MYLICON Chew 80 mg by mouth 4 (four) times daily as needed for flatulence.   Stiolto Respimat 2.5-2.5 MCG/ACT Aers Generic drug: Tiotropium Bromide-Olodaterol Inhale 2 each into the lungs 2 (two) times daily.   traMADol 50 MG tablet Commonly known as: Ultram Take 1 tablet (50 mg total) by mouth every 6 (six) hours as needed.        Discharge Exam: Filed Weights   08/23/23 2133  Weight: 83.9 kg   ***  Condition at discharge: {DC Condition:26389}  The  results of significant diagnostics from this hospitalization (including imaging, microbiology, ancillary and laboratory) are listed below for reference.   Imaging Studies: DG Chest 2 View  Result Date: 08/23/2023 CLINICAL DATA:  Shortness of breath, tested positive for COVID EXAM: CHEST - 2 VIEW COMPARISON:  Radiograph 12/23/2022 FINDINGS: Stable cardiomediastinal silhouette. Aortic atherosclerotic calcification. Emphysema. Interstitial prominence and ground-glass opacities in the lower lungs has increased from 01/02/2023 suspicious for atypical infection. IMPRESSION: 1. Ground-glass and interstitial opacities in the lower lungs compatible with atypical infection. 2. Emphysema. Electronically Signed   By: Minerva Fester M.D.   On: 08/23/2023 22:25    Microbiology: Results for orders placed or performed during the hospital encounter of 08/23/23  Culture, blood (routine x 2)     Status: None (Preliminary result)   Collection Time: 08/23/23 11:57 PM   Specimen: BLOOD  Result Value Ref Range Status   Specimen Description BLOOD  LAC  Final   Special Requests   Final    BOTTLES DRAWN  AEROBIC AND ANAEROBIC Blood Culture adequate volume   Culture   Final    NO GROWTH 2 DAYS Performed at [facility], [address], [place], [place] [address]    Report Status PENDING  Incomplete  Culture, blood (routine x 2)     Status: None (Preliminary result)   Collection Time: 08/23/23 11:57 PM   Specimen: BLOOD  Result Value Ref Range Status   Specimen Description BLOOD  RAC  Final   Special Requests   Final    BOTTLES DRAWN AEROBIC AND ANAEROBIC Blood Culture results may not be optimal due to an excessive volume of blood received in culture bottles   Culture   Final    NO GROWTH 2 DAYS Performed at [facility], [address], [place], [place] [address]    Report Status PENDING  Incomplete  SARS Coronavirus 2 by RT PCR (hospital order, performed in [facility] hospital lab) *cepheid single result test* Anterior Nasal Swab     Status: Abnormal   Collection Time: 08/23/23 11:57 PM   Specimen: Anterior Nasal Swab  Result Value Ref Range Status   SARS Coronavirus 2 by RT PCR POSITIVE (A) NEGATIVE Final    Comment: (NOTE) SARS-CoV-2 target nucleic acids are DETECTED  SARS-CoV-2 RNA is generally detectable in upper respiratory specimens  during the acute phase of infection.  Positive results are indicative  of the presence of the identified virus, but do not rule out bacterial infection or co-infection with other pathogens not detected by the test.  Clinical correlation with patient history and  other diagnostic information is necessary to determine patient infection status.  The expected result is negative.  Fact Sheet for Patients:   RoadLapTop.co.za   Fact Sheet for Healthcare Providers:   http://kim-miller.com/    This test is not yet approved or cleared by the [place] FDA and  has been authorized for detection and/or diagnosis of SARS-CoV-2 by FDA under an Emergency Use Authorization (EUA).  This  EUA will remain in effect (meaning this test can be used) for the duration of  the COVID-19 declaration under Section 564(b)(1)  of the Act, 21 U.S.C. section 360-bbb-3(b)(1), unless the authorization is terminated or revoked sooner.   Performed at [facility], [address] [place], [place] [address]     Labs: CBC: Recent Labs  Lab 08/23/23 2136 08/24/23 0554  WBC 5.6 3.6*  NEUTROABS 4.4  --   HGB 10.7* 9.7*  HCT 33.7* 29.8*  MCV 96.8 95.2  PLT 201 168   Basic Metabolic Panel: Recent Labs  Lab 08/23/23 2136 08/24/23 0554  NA 139 139  K 3.7 4.0  CL 101 106  CO2 27 27  GLUCOSE 142* 181*  BUN 9 8  CREATININE 0.86 0.72  CALCIUM 8.8* 8.5*   Liver Function Tests: Recent Labs  Lab 08/23/23 2136  AST 71*  ALT 91*  ALKPHOS 76  BILITOT 0.8  PROT 6.8  ALBUMIN 3.6   CBG: Recent Labs  Lab 08/26/23 0826  GLUCAP 136*    Discharge time spent: 37 minutes.  Signed: Marcelino Duster, MD [facility] 08/26/2023

## 2023-08-27 NOTE — Group Note (Deleted)

## 2023-08-28 ENCOUNTER — Ambulatory Visit: Payer: Self-pay

## 2023-08-28 NOTE — Telephone Encounter (Signed)
Message from Phill Myron sent at 08/28/2023  1:50 PM EDT  Summary: diarrhea, upset stomach   Diarrhea , upset stomach  just recently released from the hospital, spoke with the [facility] nurse this morning but failed to mention to her what is going on with him         Chief Complaint: black watery diarrhea x 4 days Symptoms: lost 7 lbs Frequency: 4 days Pertinent Negatives: Patient denies abdominal pain, fever, dizziness,weakness Disposition: [] ED /[] Urgent Care (no appt availability in office) / [] Appointment(In office/virtual)/ []  [facility]/ [] Home Care/ [] Refused Recommended Disposition /[] [facility]/ [x]  Follow-up with PCP Additional Notes: pt called to make appt with a hospitalist. Pt advised that the doctor he saw in the hospital does not take pt's on an outpt basis. Pt's PCP is at [place]. Offered to transfer him to [place] to advise of diarrhea. Pt stated he would call them.   Reason for Disposition . Black or tarry bowel movements  (Exception: Chronic-unchanged black-grey BMs AND is taking iron pills or Pepto-Bismol.)  Answer Assessment - Initial Assessment Questions 1. DIARRHEA SEVERITY: "How bad is the diarrhea?" "How many more stools have you had in the past 24 hours than normal?"    - NO DIARRHEA (SCALE 0)   - MILD (SCALE 1-3): Few loose or mushy BMs; increase of 1-3 stools over normal daily number of stools; mild increase in ostomy output.   -  MODERATE (SCALE 4-7): Increase of 4-6 stools daily over normal; moderate increase in ostomy output.   -  SEVERE (SCALE 8-10; OR "WORST POSSIBLE"): Increase of 7 or more stools daily over normal; moderate increase in ostomy output; incontinence.     moderate 2. ONSET: "When did the diarrhea begin?"     4 diarrhea  3. BM CONSISTENCY: "How loose or watery is the diarrhea?"      Watery black  4. VOMITING: "Are you also vomiting?" If Yes, ask: "How many times in the past 24 hours?"      no 5. ABDOMEN PAIN: "Are you  having any abdomen pain?" If Yes, ask: "What does it feel like?" (e.g., crampy, dull, intermittent, constant)      no 6. ABDOMEN PAIN SEVERITY: If present, ask: "How bad is the pain?"  (e.g., Scale 1-10; mild, moderate, or severe)   - MILD (1-3): doesn't interfere with normal activities, abdomen soft and not tender to touch    - MODERATE (4-7): interferes with normal activities or awakens from sleep, abdomen tender to touch    - SEVERE (8-10): excruciating pain, doubled over, unable to do any normal activities       No  7. ORAL INTAKE: If vomiting, "Have you been able to drink liquids?" "How much liquids have you had in the past 24 hours?"     N/a 8. HYDRATION: "Any signs of dehydration?" (e.g., dry mouth [not just dry lips], too weak to stand, dizziness, new weight loss) "When did you last urinate?"     Lost 7 lbs   10. ANTIBIOTIC USE: "Are you taking antibiotics now or have you taken antibiotics in the past 2 months?"       yes 11. OTHER SYMPTOMS: "Do you have any other symptoms?" (e.g., fever, blood in stool)       no 12. PREGNANCY: "Is there any chance you are pregnant?" "When was your last menstrual period?"       N/a  Protocols used: [facility]

## 2023-08-28 NOTE — Telephone Encounter (Signed)
Summary: diarrhea, upset stomach   Diarrhea , upset stomach  just recently released from the hospital, spoke with the [facility] nurse this morning but failed to mention to her what is going on with him     Returned pt's call. Left message on machine to return our call.

## 2023-08-29 LAB — CULTURE, BLOOD (ROUTINE X 2)
Culture: NO GROWTH
Culture: NO GROWTH
Special Requests: ADEQUATE

## 2023-10-04 ENCOUNTER — Encounter: Payer: No Typology Code available for payment source | Attending: Anesthesiology | Admitting: *Deleted

## 2023-10-04 ENCOUNTER — Encounter: Payer: Self-pay | Admitting: *Deleted

## 2023-10-04 DIAGNOSIS — Z5189 Encounter for other specified aftercare: Secondary | ICD-10-CM | POA: Insufficient documentation

## 2023-10-04 DIAGNOSIS — Z87891 Personal history of nicotine dependence: Secondary | ICD-10-CM | POA: Insufficient documentation

## 2023-10-04 DIAGNOSIS — R0602 Shortness of breath: Secondary | ICD-10-CM

## 2023-10-04 DIAGNOSIS — J449 Chronic obstructive pulmonary disease, unspecified: Secondary | ICD-10-CM | POA: Insufficient documentation

## 2023-10-04 NOTE — Progress Notes (Signed)
Virtual orientation call completed today. he has an appointment on Date: 10/10/2023  for EP eval and gym Orientation.  Documentation of diagnosis can be found in [facility]  CE Date: 07/24/2023   08/23/2023 .   Darius Norris was hospitalized for Covid in Sept. He is still gaining back his energy after discharge.

## 2023-10-10 VITALS — Ht 65.6 in | Wt 185.7 lb

## 2023-10-10 DIAGNOSIS — Z5189 Encounter for other specified aftercare: Secondary | ICD-10-CM | POA: Diagnosis present

## 2023-10-10 DIAGNOSIS — J449 Chronic obstructive pulmonary disease, unspecified: Secondary | ICD-10-CM

## 2023-10-10 DIAGNOSIS — R0602 Shortness of breath: Secondary | ICD-10-CM

## 2023-10-10 DIAGNOSIS — Z87891 Personal history of nicotine dependence: Secondary | ICD-10-CM | POA: Diagnosis not present

## 2023-10-10 NOTE — Progress Notes (Signed)
Pulmonary Individual Treatment Plan  Patient Details  Name: Darius Norris MRN: [id number] Date of Birth: 10-19-1951 Referring Provider:   Flowsheet Row Pulmonary Rehab from 10/10/2023 in [facility] Cardiac and Pulmonary Rehab  Referring Provider Dr. Mliss Sax       Initial Encounter Date:  Flowsheet Row Pulmonary Rehab from 10/10/2023 in [facility] Cardiac and Pulmonary Rehab  Date 10/10/23       Visit Diagnosis: Shortness of breath  Stage 3 severe COPD by GOLD classification (HCC)  Patient's Home Medications on Admission:  Current Outpatient Medications:    acetaminophen (TYLENOL) 325 MG tablet, Take 975 mg by mouth 3 (three) times daily., Disp: , Rfl:    albuterol (VENTOLIN HFA) 108 (90 Base) MCG/ACT inhaler, Inhale into the lungs., Disp: , Rfl:    aspirin EC 81 MG EC tablet, Take 1 tablet (81 mg total) by mouth daily., Disp: 180 tablet, Rfl: 0   atorvastatin (LIPITOR) 80 MG tablet, Take 0.5 tablets (40 mg total) by mouth at bedtime. Half of an 80, Disp: , Rfl:    busPIRone (BUSPAR) 10 MG tablet, Take 10 mg by mouth 2 (two) times daily., Disp: , Rfl:    clobetasol (TEMOVATE) 0.05 % external solution, Apply topically. (Patient not taking: Reported on 10/04/2023), Disp: , Rfl:    dabigatran (PRADAXA) 150 MG CAPS capsule, Take 150 mg by mouth 2 (two) times daily., Disp: , Rfl:    diltiazem (CARDIZEM CD) 300 MG 24 hr capsule, Take 1 capsule (300 mg total) by mouth daily., Disp: 30 capsule, Rfl: 0   empagliflozin (JARDIANCE) 25 MG TABS tablet, Take 25 mg by mouth daily. Take 0.5 tablet (12.5 mg) by mouth once daily, Disp: , Rfl:    folic acid (FOLVITE) 1 MG tablet, Take 2 mg by mouth daily. Hold on Sunday when taking Methotrexate, Disp: , Rfl:    furosemide (LASIX) 40 MG tablet, Take 1 tablet (40 mg total) by mouth daily. (Patient taking differently: Take 40 mg by mouth daily. Take only if weight has increased by 3 lbs), Disp: 30 tablet, Rfl: 2   gabapentin (NEURONTIN) 300 MG capsule,  Take 600 mg by mouth 3 (three) times daily., Disp: , Rfl:    hydrocortisone 2.5 % cream, Apply topically., Disp: , Rfl:    isosorbide mononitrate (IMDUR) 60 MG 24 hr tablet, Take 60 mg by mouth daily., Disp: , Rfl:    lidocaine (LIDODERM) 5 %, Place 1 patch onto the skin daily. Remove & Discard patch within 12 hours or as directed by MD (Patient not taking: Reported on 10/04/2023), Disp: , Rfl:    loratadine (CLARITIN) 10 MG tablet, Take 10 mg by mouth daily as needed for allergies. Take 0.5 tablet (5 mg) by mouth once daily as needed for allergies, Disp: , Rfl:    losartan (COZAAR) 25 MG tablet, Take 25 mg by mouth daily., Disp: , Rfl:    magnesium oxide (MAG-OX) 400 MG tablet, Take 800 mg by mouth daily., Disp: , Rfl:    Melatonin 3 MG CAPS, Take 2 capsules by mouth at bedtime., Disp: , Rfl:    metFORMIN (GLUCOPHAGE) 500 MG tablet, Take 500 mg by mouth 2 (two) times daily with a meal., Disp: , Rfl:    methotrexate (RHEUMATREX) 2.5 MG tablet, Take 12.5 mg by mouth once a week. Take 12.5 mg by mouth once weekly (on Sundays), Disp: , Rfl:    metoprolol tartrate (LOPRESSOR) 100 MG tablet, Take 1 tablet (100 mg total) by mouth 2 (two) times  daily., Disp: 60 tablet, Rfl: 2   Mometasone Furoate 100 MCG/ACT AERO, Inhale 2 puffs into the lungs 2 (two) times daily., Disp: , Rfl:    omeprazole (PRILOSEC) 20 MG capsule, Take 20 mg by mouth daily., Disp: , Rfl:    oxymetazoline (AFRIN) 0.05 % nasal spray, Place 1 spray into both nostrils daily., Disp: , Rfl:    potassium chloride SA (KLOR-CON M) 20 MEQ tablet, Take 20 mEq by mouth daily as needed (With Furosemide)., Disp: , Rfl:    senna (SENOKOT) 8.6 MG TABS tablet, Take 1 tablet by mouth daily as needed for mild constipation., Disp: , Rfl:    simethicone (MYLICON) 80 MG chewable tablet, Chew 80 mg by mouth 4 (four) times daily as needed for flatulence., Disp: , Rfl:    Tiotropium Bromide-Olodaterol (STIOLTO RESPIMAT) 2.5-2.5 MCG/ACT AERS, Inhale 2 each into  the lungs 2 (two) times daily., Disp: , Rfl:    traMADol (ULTRAM) 50 MG tablet, Take 1 tablet (50 mg total) by mouth every 6 (six) hours as needed. (Patient not taking: Reported on 08/24/2023), Disp: 10 tablet, Rfl: 0  Past Medical History: Past Medical History:  Diagnosis Date   A-fib (HCC)    CAD (coronary artery disease)    Colitis    COPD (chronic obstructive pulmonary disease) (HCC)    Diabetes mellitus without complication (HCC)    Emphysema lung (HCC)    GERD (gastroesophageal reflux disease)    Hypertension    MI (myocardial infarction) (HCC) 1999    Tobacco Use: Social History   Tobacco Use  Smoking Status Former   Current packs/day: 0.00   Average packs/day: 1 pack/day for 30.0 years (30.0 ttl pk-yrs)   Types: Cigarettes   Start date: 12/19/1971   Quit date: 12/18/2001   Years since quitting: 21.8  Smokeless Tobacco Never    Labs: Review Flowsheet  More data exists      Latest Ref Rng & Units 05/02/2018 01/05/2019 12/06/2021 01/02/2023 01/03/2023  Labs for ITP Cardiac and Pulmonary Rehab  Cholestrol 0 - 200 mg/dL - - 161  - 98   LDL (calc) 0 - 99 mg/dL - - 51  - 53   HDL-C >09 mg/dL - - 33  - 29   Trlycerides <150 mg/dL - - 604  - 81   Hemoglobin A1c 4.8 - 5.6 % - - 5.9  6.0  -  Bicarbonate 20.0 - 28.0 mmol/L 36.3  29.6  - - -  O2 Saturation % - 81.7  - - -    Details             Pulmonary Assessment Scores:  Pulmonary Assessment Scores     Row Name 10/10/23 1552         ADL UCSD   ADL Phase Entry     SOB Score total 58     Rest 2     Walk 3     Stairs 5     Bath 4     Dress 2     Shop 3       CAT Score   CAT Score 24       mMRC Score   mMRC Score 1              UCSD: Self-administered rating of dyspnea associated with activities of daily living (ADLs) 6-point scale (0 = "not at all" to 5 = "maximal or unable to do because of breathlessness")  Scoring Scores range from 0 to 120.  Minimally important difference is 5 units  CAT: CAT  can identify the health impairment of COPD patients and is better correlated with disease progression.  CAT has a scoring range of zero to 40. The CAT score is classified into four groups of low (less than 10), medium (10 - 20), high (21-30) and very high (31-40) based on the impact level of disease on health status. A CAT score over 10 suggests significant symptoms.  A worsening CAT score could be explained by an exacerbation, poor medication adherence, poor inhaler technique, or progression of COPD or comorbid conditions.  CAT MCID is 2 points  mMRC: mMRC (Modified Medical Research Council) Dyspnea Scale is used to assess the degree of baseline functional disability in patients of respiratory disease due to dyspnea. No minimal important difference is established. A decrease in score of 1 point or greater is considered a positive change.   Pulmonary Function Assessment:   Exercise Target Goals: Exercise Program Goal: Individual exercise prescription set using results from initial 6 min walk test and THRR while considering  patient's activity barriers and safety.   Exercise Prescription Goal: Initial exercise prescription builds to 30-45 minutes a day of aerobic activity, 2-3 days per week.  Home exercise guidelines will be given to patient during program as part of exercise prescription that the participant will acknowledge.  Education: Aerobic Exercise: - Group verbal and visual presentation on the components of exercise prescription. Introduces F.I.T.T principle from ACSM for exercise prescriptions.  Reviews F.I.T.T. principles of aerobic exercise including progression. Written material given at graduation. Flowsheet Row Pulmonary Rehab from 11/03/2021 in [facility] Cardiac and Pulmonary Rehab  Date 10/13/21  Educator [facility]  Instruction Review Code 1- Verbalizes Understanding       Education: Resistance Exercise: - Group verbal and visual presentation on the components of exercise  prescription. Introduces F.I.T.T principle from ACSM for exercise prescriptions  Reviews F.I.T.T. principles of resistance exercise including progression. Written material given at graduation.    Education: Exercise & Equipment Safety: - Individual verbal instruction and demonstration of equipment use and safety with use of the equipment. Flowsheet Row Pulmonary Rehab from 10/10/2023 in [facility] Cardiac and Pulmonary Rehab  Date 10/10/23  Educator [facility]  Instruction Review Code 1- Verbalizes Understanding       Education: Exercise Physiology & General Exercise Guidelines: - Group verbal and written instruction with models to review the exercise physiology of the cardiovascular system and associated critical values. Provides general exercise guidelines with specific guidelines to those with heart or lung disease.  Flowsheet Row Pulmonary Rehab from 10/10/2023 in [facility] Cardiac and Pulmonary Rehab  Education need identified 10/10/23       Education: Flexibility, Balance, Mind/Body Relaxation: - Group verbal and visual presentation with interactive activity on the components of exercise prescription. Introduces F.I.T.T principle from ACSM for exercise prescriptions. Reviews F.I.T.T. principles of flexibility and balance exercise training including progression. Also discusses the mind body connection.  Reviews various relaxation techniques to help reduce and manage stress (i.e. Deep breathing, progressive muscle relaxation, and visualization). Balance handout provided to take home. Written material given at graduation. Flowsheet Row Pulmonary Rehab from 11/03/2021 in [facility] Cardiac and Pulmonary Rehab  Date 10/27/21  Educator [facility]  Instruction Review Code 1- Verbalizes Understanding       Activity Barriers & Risk Stratification:  Activity Barriers & Cardiac Risk Stratification - 10/10/23 1555       Activity Barriers & Cardiac Risk Stratification   Activity Barriers Joint Problems;Balance  Concerns;Deconditioning;Shortness of  Breath;Muscular Weakness    Cardiac Risk Stratification Low             6 Minute Walk:  6 Minute Walk     Row Name 10/10/23 1552         6 Minute Walk   Phase Initial     Distance 615 feet     Walk Time 5.54 minutes     # of Rest Breaks 1     MPH 1.2     METS 1.1     RPE 13     Perceived Dyspnea  1     VO2 Peak 3.76     Symptoms No     Resting HR 58 bpm     Resting BP 106/52     Resting Oxygen Saturation  96 %     Exercise Oxygen Saturation  during 6 min walk 88 %     Max Ex. HR 68 bpm     Max Ex. BP 126/60     2 Minute Post BP 108/48       Interval HR   1 Minute HR 64     2 Minute HR 66     3 Minute HR 67     4 Minute HR 68     5 Minute HR 68     6 Minute HR 68     2 Minute Post HR 58     Interval Heart Rate? Yes       Interval Oxygen   Interval Oxygen? Yes     Baseline Oxygen Saturation % 96 %     1 Minute Oxygen Saturation % 92 %     1 Minute Liters of Oxygen 4 L     2 Minute Oxygen Saturation % 89 %     2 Minute Liters of Oxygen 4 L     3 Minute Oxygen Saturation % 91 %     3 Minute Liters of Oxygen 4 L     4 Minute Oxygen Saturation % 89 %     4 Minute Liters of Oxygen 4 L     5 Minute Oxygen Saturation % 88 %     5 Minute Liters of Oxygen 4 L     6 Minute Oxygen Saturation % 91 %     6 Minute Liters of Oxygen 4 L     2 Minute Post Oxygen Saturation % 97 %     2 Minute Post Liters of Oxygen 3 L             Oxygen Initial Assessment:  Oxygen Initial Assessment - 10/04/23 1110       Home Oxygen   Home Oxygen Device Home Concentrator;E-Tanks    Sleep Oxygen Prescription Continuous    Liters per minute 3    Home Exercise Oxygen Prescription Continuous    Liters per minute 3    Home Resting Oxygen Prescription Continuous    Liters per minute 3    Compliance with Home Oxygen Use Yes      Intervention   Short Term Goals To learn and exhibit compliance with exercise, home and travel O2 prescription;To  learn and understand importance of monitoring SPO2 with pulse oximeter and demonstrate accurate use of the pulse oximeter.;To learn and understand importance of maintaining oxygen saturations>88%;To learn and demonstrate proper pursed lip breathing techniques or other breathing techniques. ;To learn and demonstrate proper use of respiratory medications    Long  Term Goals Exhibits compliance with  exercise, home  and travel O2 prescription;Verbalizes importance of monitoring SPO2 with pulse oximeter and return demonstration;Maintenance of O2 saturations>88%;Exhibits proper breathing techniques, such as pursed lip breathing or other method taught during program session;Compliance with respiratory medication;Demonstrates proper use of MDI's             Oxygen Re-Evaluation:   Oxygen Discharge (Final Oxygen Re-Evaluation):   Initial Exercise Prescription:  Initial Exercise Prescription - 10/10/23 1500       Date of Initial Exercise RX and Referring Provider   Date 10/10/23    Referring Provider Dr. Mliss Sax      Oxygen   Oxygen Continuous    Liters 3L    Maintain Oxygen Saturation 88% or higher      Treadmill   MPH 1.2    Grade 0    Minutes 15    METs 1.1      NuStep   Level 1    SPM 80    Minutes 15    METs 1.1      T5 Nustep   Level 1    SPM 80    Minutes 15    METs 1.1      Prescription Details   Duration Progress to 30 minutes of continuous aerobic without signs/symptoms of physical distress      Intensity   THRR 40-80% of Max Heartrate 94-130    Ratings of Perceived Exertion 11-13    Perceived Dyspnea 0-4      Progression   Progression Continue to progress workloads to maintain intensity without signs/symptoms of physical distress.      Resistance Training   Training Prescription Yes    Weight 3 lb    Reps 10-15             Perform Capillary Blood Glucose checks as needed.  Exercise Prescription Changes:   Exercise Prescription Changes      Row Name 10/10/23 1500             Response to Exercise   Blood Pressure (Admit) 106/52       Blood Pressure (Exercise) 126/60       Blood Pressure (Exit) 108/48       Heart Rate (Admit) 58 bpm       Heart Rate (Exercise) 68 bpm       Heart Rate (Exit) 58 bpm       Oxygen Saturation (Admit) 96 %       Oxygen Saturation (Exercise) 88 %       Oxygen Saturation (Exit) 97 %       Rating of Perceived Exertion (Exercise) 13       Perceived Dyspnea (Exercise) 1       Symptoms none       Comments results       Duration Progress to 30 minutes of  aerobic without signs/symptoms of physical distress       Intensity THRR New         Progression   Progression Continue to progress workloads to maintain intensity without signs/symptoms of physical distress.       Average METs 1.1                Exercise Comments:   Exercise Goals and Review:   Exercise Goals     Row Name 10/10/23 1600             Exercise Goals   Increase Physical Activity Yes  Intervention Provide advice, education, support and counseling about physical activity/exercise needs.;Develop an individualized exercise prescription for aerobic and resistive training based on initial evaluation findings, risk stratification, comorbidities and participant's personal goals.       Expected Outcomes Short Term: Attend rehab on a regular basis to increase amount of physical activity.;Long Term: Add in home exercise to make exercise part of routine and to increase amount of physical activity.;Long Term: Exercising regularly at least 3-5 days a week.       Increase Strength and Stamina Yes       Intervention Provide advice, education, support and counseling about physical activity/exercise needs.;Develop an individualized exercise prescription for aerobic and resistive training based on initial evaluation findings, risk stratification, comorbidities and participant's personal goals.       Expected Outcomes Short  Term: Increase workloads from initial exercise prescription for resistance, speed, and METs.;Short Term: Perform resistance training exercises routinely during rehab and add in resistance training at home;Long Term: Improve cardiorespiratory fitness, muscular endurance and strength as measured by increased METs and functional capacity ( )       Able to understand and use rate of perceived exertion (RPE) scale Yes       Intervention Provide education and explanation on how to use RPE scale       Expected Outcomes Short Term: Able to use RPE daily in rehab to express subjective intensity level;Long Term:  Able to use RPE to guide intensity level when exercising independently       Able to understand and use Dyspnea scale Yes       Intervention Provide education and explanation on how to use Dyspnea scale       Expected Outcomes Short Term: Able to use Dyspnea scale daily in rehab to express subjective sense of shortness of breath during exertion;Long Term: Able to use Dyspnea scale to guide intensity level when exercising independently       Knowledge and understanding of Target Heart Rate Range (THRR) Yes       Intervention Provide education and explanation of THRR including how the numbers were predicted and where they are located for reference       Expected Outcomes Short Term: Able to state/look up THRR;Short Term: Able to use daily as guideline for intensity in rehab;Long Term: Able to use THRR to govern intensity when exercising independently       Able to check pulse independently Yes       Intervention Provide education and demonstration on how to check pulse in carotid and radial arteries.;Review the importance of being able to check your own pulse for safety during independent exercise       Expected Outcomes Short Term: Able to explain why pulse checking is important during independent exercise;Long Term: Able to check pulse independently and accurately       Understanding of Exercise  Prescription Yes       Intervention Provide education, explanation, and written materials on patient's individual exercise prescription       Expected Outcomes Short Term: Able to explain program exercise prescription;Long Term: Able to explain home exercise prescription to exercise independently                Exercise Goals Re-Evaluation :   Discharge Exercise Prescription (Final Exercise Prescription Changes):  Exercise Prescription Changes - 10/10/23 1500       Response to Exercise   Blood Pressure (Admit) 106/52    Blood Pressure (Exercise) 126/60    Blood Pressure (  Exit) 108/48    Heart Rate (Admit) 58 bpm    Heart Rate (Exercise) 68 bpm    Heart Rate (Exit) 58 bpm    Oxygen Saturation (Admit) 96 %    Oxygen Saturation (Exercise) 88 %    Oxygen Saturation (Exit) 97 %    Rating of Perceived Exertion (Exercise) 13    Perceived Dyspnea (Exercise) 1    Symptoms none    Comments results    Duration Progress to 30 minutes of  aerobic without signs/symptoms of physical distress    Intensity THRR New      Progression   Progression Continue to progress workloads to maintain intensity without signs/symptoms of physical distress.    Average METs 1.1             Nutrition:  Target Goals: Understanding of nutrition guidelines, daily intake of sodium 1500mg , cholesterol 200mg , calories 30% from fat and 7% or less from saturated fats, daily to have 5 or more servings of fruits and vegetables.  Education: All About Nutrition: -Group instruction provided by verbal, written material, interactive activities, discussions, models, and posters to present general guidelines for heart healthy nutrition including fat, fiber, MyPlate, the role of sodium in heart healthy nutrition, utilization of the nutrition label, and utilization of this knowledge for meal planning. Follow up email sent as well. Written material given at graduation. Flowsheet Row Pulmonary Rehab from 07/27/2022  in [facility] Cardiac and Pulmonary Rehab  Education need identified 06/19/22       Biometrics:  Pre Biometrics - 10/10/23 1600       Pre Biometrics   Height 5' 5.6" (1.666 m)    Weight 185 lb 11.2 oz (84.2 kg)    Waist Circumference 43 inches    Hip Circumference 44.5 inches    Waist to Hip Ratio 0.97 %    BMI (Calculated) 30.35    Single Leg Stand 0 seconds              Nutrition Therapy Plan and Nutrition Goals:   Nutrition Assessments:  MEDIFICTS Score Key: >=70 Need to make dietary changes  40-70 Heart Healthy Diet <= 40 Therapeutic Level Cholesterol Diet  Flowsheet Row Pulmonary Rehab from 10/10/2023 in [facility] Cardiac and Pulmonary Rehab  Picture Your Plate Total Score on Admission 58      Picture Your Plate Scores: <04 Unhealthy dietary pattern with much room for improvement. 41-50 Dietary pattern unlikely to meet recommendations for good health and room for improvement. 51-60 More healthful dietary pattern, with some room for improvement.  >60 Healthy dietary pattern, although there may be some specific behaviors that could be improved.   Nutrition Goals Re-Evaluation:   Nutrition Goals Discharge (Final Nutrition Goals Re-Evaluation):   Psychosocial: Target Goals: Acknowledge presence or absence of significant depression and/or stress, maximize coping skills, provide positive support system. Participant is able to verbalize types and ability to use techniques and skills needed for reducing stress and depression.   Education: Stress, Anxiety, and Depression - Group verbal and visual presentation to define topics covered.  Reviews how body is impacted by stress, anxiety, and depression.  Also discusses healthy ways to reduce stress and to treat/manage anxiety and depression.  Written material given at graduation. Flowsheet Row Pulmonary Rehab from 11/03/2021 in [facility] Cardiac and Pulmonary Rehab  Date 07/28/21  Educator [facility]  Instruction Review Code 1-  [employer] Understanding       Education: Sleep Hygiene -Provides group verbal and written instruction about how sleep  can affect your health.  Define sleep hygiene, discuss sleep cycles and impact of sleep habits. Review good sleep hygiene tips.    Initial Review & Psychosocial Screening:  Initial Psych Review & Screening - 10/04/23 1111       Initial Review   Current issues with History of Depression;Current Psychotropic Meds;Current Sleep Concerns      Family Dynamics   Good Support System? Yes   wife, older sister   Concerns Recent loss of significant other   youngest sister passed away 6 months a go,  nephew died 3 months ago.     Barriers   Psychosocial barriers to participate in program There are no identifiable barriers or psychosocial needs.      Screening Interventions   Interventions Encouraged to exercise;To provide support and resources with identified psychosocial needs;Provide feedback about the scores to participant    Expected Outcomes Short Term goal: Utilizing psychosocial counselor, staff and physician to assist with identification of specific Stressors or current issues interfering with healing process. Setting desired goal for each stressor or current issue identified.;Long Term Goal: Stressors or current issues are controlled or eliminated.;Short Term goal: Identification and review with participant of any Quality of Life or Depression concerns found by scoring the questionnaire.;Long Term goal: The participant improves quality of Life and PHQ9 Scores as seen by post scores and/or verbalization of changes             Quality of Life Scores:  Scores of 19 and below usually indicate a poorer quality of life in these areas.  A difference of  2-3 points is a clinically meaningful difference.  A difference of 2-3 points in the total score of the Quality of Life Index has been associated with significant improvement in overall quality of life, self-image, physical  symptoms, and general health in studies assessing change in quality of life.  PHQ-9: Review Flowsheet  More data exists      10/10/2023 11/29/2022 07/18/2022 06/19/2022 11/17/2021  Depression screen PHQ 2/9  Decreased Interest 1 1 1 2 1   Down, Depressed, Hopeless 0 0 0 1 0  PHQ - 2 Score 1 1 1 3 1   Altered sleeping 2 3 3 2 2   Tired, decreased energy 1 2 2 2 1   Change in appetite 0 0 0 0 1  Feeling bad or failure about yourself  0 1 0 0 0  Trouble concentrating 0 0 0 0 0  Moving slowly or fidgety/restless 0 0 0 0 0  Suicidal thoughts 0 0 0 0 0  PHQ-9 Score 4 7 6 7 5   Difficult doing work/chores Somewhat difficult Somewhat difficult Somewhat difficult Not difficult at all Somewhat difficult    Details           Interpretation of Total Score  Total Score Depression Severity:  1-4 = Minimal depression, 5-9 = Mild depression, 10-14 = Moderate depression, 15-19 = Moderately severe depression, 20-27 = Severe depression   Psychosocial Evaluation and Intervention:  Psychosocial Evaluation - 10/04/23 1131       Psychosocial Evaluation & Interventions   Interventions Encouraged to exercise with the program and follow exercise prescription    Comments MIke has no barriers to attending the program. He has a history of depression and is taking meds that have his symptoms well controlled.  He is having trouble sleeping. He has to wake up to relieve the stuffiness  from the use of oxygen in his nose. Advised him to talk to his doctor  about some humidity at night for his oxygen or look for saline gel for his nostrils to help keep them moist.  . He stated he had some gel and will try it again.  MIke had 2 family members pass away in the last year. One was his younger sister and the other a nephew.   He stated he was doing okay fter their deaths.  He lives with his wife and she and his older sister are his support people.  His wife continues to workand he  experessed he thought it may be hard for her.   He is ready to return to the program.             Psychosocial Re-Evaluation:   Psychosocial Discharge (Final Psychosocial Re-Evaluation):   Education: Education Goals: Education classes will be provided on a weekly basis, covering required topics. Participant will state understanding/return demonstration of topics presented.  Learning Barriers/Preferences:   General Pulmonary Education Topics:  Infection Prevention: - Provides verbal and written material to individual with discussion of infection control including proper hand washing and proper equipment cleaning during exercise session. Flowsheet Row Pulmonary Rehab from 10/10/2023 in [facility] Cardiac and Pulmonary Rehab  Date 10/10/23  Educator [facility]  Instruction Review Code 1- Verbalizes Understanding       Falls Prevention: - Provides verbal and written material to individual with discussion of falls prevention and safety. Flowsheet Row Pulmonary Rehab from 10/10/2023 in [facility] Cardiac and Pulmonary Rehab  Date 10/10/23  Educator [facility]  Instruction Review Code 1- Verbalizes Understanding       Chronic Lung Disease Review: - Group verbal instruction with posters, models, PowerPoint presentations and videos,  to review new updates, new respiratory medications, new advancements in procedures and treatments. Providing information on websites and "800" numbers for continued self-education. Includes information about supplement oxygen, available portable oxygen systems, continuous and intermittent flow rates, oxygen safety, concentrators, and Medicare reimbursement for oxygen. Explanation of Pulmonary Drugs, including class, frequency, complications, importance of spacers, rinsing mouth after steroid MDI's, and proper cleaning methods for nebulizers. Review of basic lung anatomy and physiology related to function, structure, and complications of lung disease. Review of risk factors. Discussion about methods for diagnosing sleep apnea  and types of masks and machines for OSA. Includes a review of the use of types of environmental controls: home humidity, furnaces, filters, dust mite/pet prevention, HEPA vacuums. Discussion about weather changes, air quality and the benefits of nasal washing. Instruction on Warning signs, infection symptoms, calling MD promptly, preventive modes, and value of vaccinations. Review of effective airway clearance, coughing and/or vibration techniques. Emphasizing that all should Create an Action Plan. Written material given at graduation. Flowsheet Row Pulmonary Rehab from 10/10/2023 in [facility] Cardiac and Pulmonary Rehab  Education need identified 10/10/23       AED/CPR: - Group verbal and written instruction with the use of models to demonstrate the basic use of the AED with the basic ABC's of resuscitation. Flowsheet Row Pulmonary Rehab from 02/26/2019 in [facility] Cardiac and Pulmonary Rehab  Date 01/01/19  Educator [facility]  Instruction Review Code 1- Verbalizes Understanding        Anatomy and Cardiac Procedures: - Group verbal and visual presentation and models provide information about basic cardiac anatomy and function. Reviews the testing methods done to diagnose heart disease and the outcomes of the test results. Describes the treatment choices: Medical Management, Angioplasty, or Coronary Bypass Surgery for treating various heart conditions including Myocardial Infarction, Angina, Valve Disease, and Cardiac  Arrhythmias.  Written material given at graduation.   Medication Safety: - Group verbal and visual instruction to review commonly prescribed medications for heart and lung disease. Reviews the medication, class of the drug, and side effects. Includes the steps to properly store meds and maintain the prescription regimen.  Written material given at graduation. Flowsheet Row Pulmonary Rehab from 11/03/2021 in [facility] Cardiac and Pulmonary Rehab  Date 09/01/21  Educator [facility]  Instruction Review Code  1- Verbalizes Understanding       Other: -Provides group and verbal instruction on various topics (see comments)   Knowledge Questionnaire Score:  Knowledge Questionnaire Score - 10/10/23 1555       Knowledge Questionnaire Score   Pre Score 15/18              Core Components/Risk Factors/Patient Goals at Admission:  Personal Goals and Risk Factors at Admission - 10/04/23 1120       Core Components/Risk Factors/Patient Goals on Admission    Weight Management Yes    Intervention Weight Management: Develop a combined nutrition and exercise program designed to reach desired caloric intake, while maintaining appropriate intake of nutrient and fiber, sodium and fats, and appropriate energy expenditure required for the weight goal.;Weight Management: Provide education and appropriate resources to help participant work on and attain dietary goals.    Admit Weight 179 lb (81.2 kg)    Goal Weight: Long Term 150 lb (68 kg)    Expected Outcomes Short Term: Continue to assess and modify interventions until short term weight is achieved;Long Term: Adherence to nutrition and physical activity/exercise program aimed toward attainment of established weight goal;Weight Loss: Understanding of general recommendations for a balanced deficit meal plan, which promotes 1-2 lb weight loss per week and includes a negative energy balance of [phone] kcal/d    Improve shortness of breath with ADL's Yes    Intervention Provide education, individualized exercise plan and daily activity instruction to help decrease symptoms of SOB with activities of daily living.    Expected Outcomes Short Term: Improve cardiorespiratory fitness to achieve a reduction of symptoms when performing ADLs;Long Term: Be able to perform more ADLs without symptoms or delay the onset of symptoms    Increase knowledge of respiratory medications and ability to use respiratory devices properly  Yes    Intervention Provide education and  demonstration as needed of appropriate use of medications, inhalers, and oxygen therapy.    Expected Outcomes Short Term: Achieves understanding of medications use. Understands that oxygen is a medication prescribed by physician. Demonstrates appropriate use of inhaler and oxygen therapy.;Long Term: Maintain appropriate use of medications, inhalers, and oxygen therapy.    Diabetes Yes    Intervention Provide education about signs/symptoms and action to take for hypo/hyperglycemia.;Provide education about proper nutrition, including hydration, and aerobic/resistive exercise prescription along with prescribed medications to achieve blood glucose in normal ranges: Fasting glucose 65-99 mg/dL    Expected Outcomes Short Term: Participant verbalizes understanding of the signs/symptoms and immediate care of hyper/hypoglycemia, proper foot care and importance of medication, aerobic/resistive exercise and nutrition plan for blood glucose control.;Long Term: Attainment of HbA1C < 7%.    Hypertension Yes    Intervention Provide education on lifestyle modifcations including regular physical activity/exercise, weight management, moderate sodium restriction and increased consumption of fresh fruit, vegetables, and low fat dairy, alcohol moderation, and smoking cessation.;Monitor prescription use compliance.    Expected Outcomes Short Term: Continued assessment and intervention until BP is < 140/76mm HG in hypertensive participants. < 130/65mm  HG in hypertensive participants with diabetes, heart failure or chronic kidney disease.;Long Term: Maintenance of blood pressure at goal levels.    Lipids Yes    Intervention Provide education and support for participant on nutrition & aerobic/resistive exercise along with prescribed medications to achieve LDL 70mg , HDL >40mg .    Expected Outcomes Short Term: Participant states understanding of desired cholesterol values and is compliant with medications prescribed. Participant is  following exercise prescription and nutrition guidelines.;Long Term: Cholesterol controlled with medications as prescribed, with individualized exercise RX and with personalized nutrition plan. Value goals: LDL < 70mg , HDL > 40 mg.             Education:Diabetes - Individual verbal and written instruction to review signs/symptoms of diabetes, desired ranges of glucose level fasting, after meals and with exercise. Acknowledge that pre and post exercise glucose checks will be done for 3 sessions at entry of program. Flowsheet Row Pulmonary Rehab from 07/27/2022 in [facility] Cardiac and Pulmonary Rehab  Date 05/08/22  Educator [facility]  Instruction Review Code 1- Verbalizes Understanding       Know Your Numbers and Heart Failure: - Group verbal and visual instruction to discuss disease risk factors for cardiac and pulmonary disease and treatment options.  Reviews associated critical values for Overweight/Obesity, Hypertension, Cholesterol, and Diabetes.  Discusses basics of heart failure: signs/symptoms and treatments.  Introduces Heart Failure Zone chart for action plan for heart failure.  Written material given at graduation. Flowsheet Row Pulmonary Rehab from 07/27/2022 in [facility] Cardiac and Pulmonary Rehab  Date 07/27/22  Educator [facility]  Instruction Review Code 1- Verbalizes Understanding       Core Components/Risk Factors/Patient Goals Review:    Core Components/Risk Factors/Patient Goals at Discharge (Final Review):    ITP Comments:  ITP Comments     Row Name 10/04/23 1124 10/04/23 1138 10/10/23 1551       ITP Comments Virtual orientation call completed today. he has an appointment on Date: 10/10/2023  for EP eval and gym Orientation.  Documentation of diagnosis can be found in [facility]  CE Date: 07/24/2023   08/23/2023 . Virtual orientation call completed today. he has an appointment on Date: 10/10/2023  for EP eval and gym Orientation.  Documentation of diagnosis can be found in [facility]  CE Date:  07/24/2023   08/23/2023 .     Kathlene November was hospitalized for Covid in Sept. He is still gaining back his energy after discharge. Completed and gym orientation. Initial ITP created and sent for review to Dr. Jinny Sanders, Medical Director.              Comments: Initial ITP

## 2023-10-10 NOTE — Patient Instructions (Signed)
Patient Instructions  Patient Details  Name: Darius Norris MRN: [id number] Date of Birth: 06/30/1951 Referring Provider:  [facility]  Below are your personal goals for exercise, nutrition, and risk factors. Our goal is to help you stay on track towards obtaining and maintaining these goals. We will be discussing your progress on these goals with you throughout the program.  Initial Exercise Prescription:  Initial Exercise Prescription - 10/10/23 1500       Date of Initial Exercise RX and Referring Provider   Date 10/10/23    Referring Provider Dr. Mliss Sax      Oxygen   Oxygen Continuous    Liters 3L    Maintain Oxygen Saturation 88% or higher      Treadmill   MPH 1.2    Grade 0    Minutes 15    METs 1.1      NuStep   Level 1    SPM 80    Minutes 15    METs 1.1      T5 Nustep   Level 1    SPM 80    Minutes 15    METs 1.1      Prescription Details   Duration Progress to 30 minutes of continuous aerobic without signs/symptoms of physical distress      Intensity   THRR 40-80% of Max Heartrate 94-130    Ratings of Perceived Exertion 11-13    Perceived Dyspnea 0-4      Progression   Progression Continue to progress workloads to maintain intensity without signs/symptoms of physical distress.      Resistance Training   Training Prescription Yes    Weight 3 lb    Reps 10-15             Exercise Goals: Frequency: Be able to perform aerobic exercise two to three times per week in program working toward 2-5 days per week of home exercise.  Intensity: Work with a perceived exertion of 11 (fairly light) - 15 (hard) while following your exercise prescription.  We will make changes to your prescription with you as you progress through the program.   Duration: Be able to do 30 to 45 minutes of continuous aerobic exercise in addition to a 5 minute warm-up and a 5 minute cool-down routine.   Nutrition Goals: Your personal nutrition goals will  be established when you do your nutrition analysis with the dietician.  The following are general nutrition guidelines to follow: Cholesterol < 200mg /day Sodium < 1500mg /day Fiber: Men over 50 yrs - 30 grams per day  Personal Goals:  Personal Goals and Risk Factors at Admission - 10/04/23 1120       Core Components/Risk Factors/Patient Goals on Admission    Weight Management Yes    Intervention Weight Management: Develop a combined nutrition and exercise program designed to reach desired caloric intake, while maintaining appropriate intake of nutrient and fiber, sodium and fats, and appropriate energy expenditure required for the weight goal.;Weight Management: Provide education and appropriate resources to help participant work on and attain dietary goals.    Admit Weight 179 lb (81.2 kg)    Goal Weight: Long Term 150 lb (68 kg)    Expected Outcomes Short Term: Continue to assess and modify interventions until short term weight is achieved;Long Term: Adherence to nutrition and physical activity/exercise program aimed toward attainment of established weight goal;Weight Loss: Understanding of general recommendations for a balanced deficit meal plan, which promotes 1-2 lb weight loss per  week and includes a negative energy balance of [phone] kcal/d    Improve shortness of breath with ADL's Yes    Intervention Provide education, individualized exercise plan and daily activity instruction to help decrease symptoms of SOB with activities of daily living.    Expected Outcomes Short Term: Improve cardiorespiratory fitness to achieve a reduction of symptoms when performing ADLs;Long Term: Be able to perform more ADLs without symptoms or delay the onset of symptoms    Increase knowledge of respiratory medications and ability to use respiratory devices properly  Yes    Intervention Provide education and demonstration as needed of appropriate use of medications, inhalers, and oxygen therapy.    Expected  Outcomes Short Term: Achieves understanding of medications use. Understands that oxygen is a medication prescribed by physician. Demonstrates appropriate use of inhaler and oxygen therapy.;Long Term: Maintain appropriate use of medications, inhalers, and oxygen therapy.    Diabetes Yes    Intervention Provide education about signs/symptoms and action to take for hypo/hyperglycemia.;Provide education about proper nutrition, including hydration, and aerobic/resistive exercise prescription along with prescribed medications to achieve blood glucose in normal ranges: Fasting glucose 65-99 mg/dL    Expected Outcomes Short Term: Participant verbalizes understanding of the signs/symptoms and immediate care of hyper/hypoglycemia, proper foot care and importance of medication, aerobic/resistive exercise and nutrition plan for blood glucose control.;Long Term: Attainment of HbA1C < 7%.    Hypertension Yes    Intervention Provide education on lifestyle modifcations including regular physical activity/exercise, weight management, moderate sodium restriction and increased consumption of fresh fruit, vegetables, and low fat dairy, alcohol moderation, and smoking cessation.;Monitor prescription use compliance.    Expected Outcomes Short Term: Continued assessment and intervention until BP is < 140/53mm HG in hypertensive participants. < 130/73mm HG in hypertensive participants with diabetes, heart failure or chronic kidney disease.;Long Term: Maintenance of blood pressure at goal levels.    Lipids Yes    Intervention Provide education and support for participant on nutrition & aerobic/resistive exercise along with prescribed medications to achieve LDL 70mg , HDL >40mg .    Expected Outcomes Short Term: Participant states understanding of desired cholesterol values and is compliant with medications prescribed. Participant is following exercise prescription and nutrition guidelines.;Long Term: Cholesterol controlled with  medications as prescribed, with individualized exercise RX and with personalized nutrition plan. Value goals: LDL < 70mg , HDL > 40 mg.            Exercise Goals and Review:  Exercise Goals     Row Name 10/10/23 1600             Exercise Goals   Increase Physical Activity Yes       Intervention Provide advice, education, support and counseling about physical activity/exercise needs.;Develop an individualized exercise prescription for aerobic and resistive training based on initial evaluation findings, risk stratification, comorbidities and participant's personal goals.       Expected Outcomes Short Term: Attend rehab on a regular basis to increase amount of physical activity.;Long Term: Add in home exercise to make exercise part of routine and to increase amount of physical activity.;Long Term: Exercising regularly at least 3-5 days a week.       Increase Strength and Stamina Yes       Intervention Provide advice, education, support and counseling about physical activity/exercise needs.;Develop an individualized exercise prescription for aerobic and resistive training based on initial evaluation findings, risk stratification, comorbidities and participant's personal goals.       Expected Outcomes Short Term: Increase workloads  from initial exercise prescription for resistance, speed, and METs.;Short Term: Perform resistance training exercises routinely during rehab and add in resistance training at home;Long Term: Improve cardiorespiratory fitness, muscular endurance and strength as measured by increased METs and functional capacity ( )       Able to understand and use rate of perceived exertion (RPE) scale Yes       Intervention Provide education and explanation on how to use RPE scale       Expected Outcomes Short Term: Able to use RPE daily in rehab to express subjective intensity level;Long Term:  Able to use RPE to guide intensity level when exercising independently       Able to  understand and use Dyspnea scale Yes       Intervention Provide education and explanation on how to use Dyspnea scale       Expected Outcomes Short Term: Able to use Dyspnea scale daily in rehab to express subjective sense of shortness of breath during exertion;Long Term: Able to use Dyspnea scale to guide intensity level when exercising independently       Knowledge and understanding of Target Heart Rate Range (THRR) Yes       Intervention Provide education and explanation of THRR including how the numbers were predicted and where they are located for reference       Expected Outcomes Short Term: Able to state/look up THRR;Short Term: Able to use daily as guideline for intensity in rehab;Long Term: Able to use THRR to govern intensity when exercising independently       Able to check pulse independently Yes       Intervention Provide education and demonstration on how to check pulse in carotid and radial arteries.;Review the importance of being able to check your own pulse for safety during independent exercise       Expected Outcomes Short Term: Able to explain why pulse checking is important during independent exercise;Long Term: Able to check pulse independently and accurately       Understanding of Exercise Prescription Yes       Intervention Provide education, explanation, and written materials on patient's individual exercise prescription       Expected Outcomes Short Term: Able to explain program exercise prescription;Long Term: Able to explain home exercise prescription to exercise independently                Copy of goals given to participant.

## 2023-10-11 ENCOUNTER — Encounter: Payer: No Typology Code available for payment source | Admitting: *Deleted

## 2023-10-16 ENCOUNTER — Encounter: Payer: No Typology Code available for payment source | Admitting: *Deleted

## 2023-10-16 DIAGNOSIS — R0602 Shortness of breath: Secondary | ICD-10-CM

## 2023-10-16 DIAGNOSIS — J449 Chronic obstructive pulmonary disease, unspecified: Secondary | ICD-10-CM

## 2023-10-16 DIAGNOSIS — Z5189 Encounter for other specified aftercare: Secondary | ICD-10-CM | POA: Diagnosis not present

## 2023-10-16 LAB — GLUCOSE, CAPILLARY
Glucose-Capillary: 124 mg/dL — ABNORMAL HIGH (ref 70–99)
Glucose-Capillary: 122 mg/dL — ABNORMAL HIGH (ref 70–99)

## 2023-10-16 NOTE — Progress Notes (Signed)
Daily Session Note  Patient Details  Name: Darius Norris MRN: [id number] Date of Birth: Mar 18, 1951 Referring Provider:   Flowsheet Row Pulmonary Rehab from 10/10/2023 in [facility] Cardiac and Pulmonary Rehab  Referring Provider Dr. Mliss Sax       Encounter Date: 10/16/2023  Check In:  Session Check In - 10/16/23 1126       Check-In   Supervising physician immediately available to respond to emergencies See telemetry face sheet for immediately available ER MD    Location [facility]-Cardiac & Pulmonary Rehab    Staff Present Cora Collum, RN, BSN, CCRP;Jason Wallace Cullens, RDN, LDN;Meredith Jewel Baize, RN BSN;Maxon Conetta BS, , Exercise Physiologist    Virtual Visit No    Medication changes reported     No    Fall or balance concerns reported    No    Warm-up and Cool-down Performed on first and last piece of equipment    Resistance Training Performed Yes    VAD Patient? No    PAD/SET Patient? No      Pain Assessment   Currently in Pain? No/denies                Social History   Tobacco Use  Smoking Status Former   Current packs/day: 0.00   Average packs/day: 1 pack/day for 30.0 years (30.0 ttl pk-yrs)   Types: Cigarettes   Start date: 12/19/1971   Quit date: 12/18/2001   Years since quitting: 21.8  Smokeless Tobacco Never    Goals Met:  Proper associated with RPD/PD & O2 Sat Independence with exercise equipment Exercise tolerated well Personal goals reviewed No report of concerns or symptoms today  Goals Unmet:  Not Applicable  Comments: First full day of exercise!  Patient was oriented to gym and equipment including functions, settings, policies, and procedures.  Patient's individual exercise prescription and treatment plan were reviewed.  All starting workloads were established based on the results of the 6 minute walk test done at initial orientation visit.  The plan for exercise progression was also introduced and progression will be customized based on patient's  performance and goals.    Dr. Bethann Punches is Medical Director for [facility] Cardiac Rehabilitation.  Dr. Vida Rigger is Medical Director for [facility] Pulmonary Rehabilitation.

## 2023-10-17 ENCOUNTER — Encounter: Payer: Self-pay | Admitting: *Deleted

## 2023-10-17 DIAGNOSIS — J449 Chronic obstructive pulmonary disease, unspecified: Secondary | ICD-10-CM

## 2023-10-17 DIAGNOSIS — R0602 Shortness of breath: Secondary | ICD-10-CM

## 2023-10-17 NOTE — Progress Notes (Signed)
Pulmonary Individual Treatment Plan  Patient Details  Name: Darius Norris MRN: [id number] Date of Birth: 04-11-1951 Referring Provider:   Flowsheet Row Pulmonary Rehab from 10/10/2023 in [facility] Cardiac and Pulmonary Rehab  Referring Provider Dr. Mliss Sax       Initial Encounter Date:  Flowsheet Row Pulmonary Rehab from 10/10/2023 in [facility] Cardiac and Pulmonary Rehab  Date 10/10/23       Visit Diagnosis: Shortness of breath  Stage 3 severe COPD by GOLD classification (HCC)  Patient's Home Medications on Admission:  Current Outpatient Medications:    acetaminophen (TYLENOL) 325 MG tablet, Take 975 mg by mouth 3 (three) times daily., Disp: , Rfl:    albuterol (VENTOLIN HFA) 108 (90 Base) MCG/ACT inhaler, Inhale into the lungs., Disp: , Rfl:    aspirin EC 81 MG EC tablet, Take 1 tablet (81 mg total) by mouth daily., Disp: 180 tablet, Rfl: 0   atorvastatin (LIPITOR) 80 MG tablet, Take 0.5 tablets (40 mg total) by mouth at bedtime. Half of an 80, Disp: , Rfl:    busPIRone (BUSPAR) 10 MG tablet, Take 10 mg by mouth 2 (two) times daily., Disp: , Rfl:    clobetasol (TEMOVATE) 0.05 % external solution, Apply topically. (Patient not taking: Reported on 10/04/2023), Disp: , Rfl:    dabigatran (PRADAXA) 150 MG CAPS capsule, Take 150 mg by mouth 2 (two) times daily., Disp: , Rfl:    diltiazem (CARDIZEM CD) 300 MG 24 hr capsule, Take 1 capsule (300 mg total) by mouth daily., Disp: 30 capsule, Rfl: 0   empagliflozin (JARDIANCE) 25 MG TABS tablet, Take 25 mg by mouth daily. Take 0.5 tablet (12.5 mg) by mouth once daily, Disp: , Rfl:    folic acid (FOLVITE) 1 MG tablet, Take 2 mg by mouth daily. Hold on Sunday when taking Methotrexate, Disp: , Rfl:    furosemide (LASIX) 40 MG tablet, Take 1 tablet (40 mg total) by mouth daily. (Patient taking differently: Take 40 mg by mouth daily. Take only if weight has increased by 3 lbs), Disp: 30 tablet, Rfl: 2   gabapentin (NEURONTIN) 300 MG capsule,  Take 600 mg by mouth 3 (three) times daily., Disp: , Rfl:    hydrocortisone 2.5 % cream, Apply topically., Disp: , Rfl:    isosorbide mononitrate (IMDUR) 60 MG 24 hr tablet, Take 60 mg by mouth daily., Disp: , Rfl:    lidocaine (LIDODERM) 5 %, Place 1 patch onto the skin daily. Remove & Discard patch within 12 hours or as directed by MD (Patient not taking: Reported on 10/04/2023), Disp: , Rfl:    loratadine (CLARITIN) 10 MG tablet, Take 10 mg by mouth daily as needed for allergies. Take 0.5 tablet (5 mg) by mouth once daily as needed for allergies, Disp: , Rfl:    losartan (COZAAR) 25 MG tablet, Take 25 mg by mouth daily., Disp: , Rfl:    magnesium oxide (MAG-OX) 400 MG tablet, Take 800 mg by mouth daily., Disp: , Rfl:    Melatonin 3 MG CAPS, Take 2 capsules by mouth at bedtime., Disp: , Rfl:    metFORMIN (GLUCOPHAGE) 500 MG tablet, Take 500 mg by mouth 2 (two) times daily with a meal., Disp: , Rfl:    methotrexate (RHEUMATREX) 2.5 MG tablet, Take 12.5 mg by mouth once a week. Take 12.5 mg by mouth once weekly (on Sundays), Disp: , Rfl:    metoprolol tartrate (LOPRESSOR) 100 MG tablet, Take 1 tablet (100 mg total) by mouth 2 (two) times  daily., Disp: 60 tablet, Rfl: 2   Mometasone Furoate 100 MCG/ACT AERO, Inhale 2 puffs into the lungs 2 (two) times daily., Disp: , Rfl:    omeprazole (PRILOSEC) 20 MG capsule, Take 20 mg by mouth daily., Disp: , Rfl:    oxymetazoline (AFRIN) 0.05 % nasal spray, Place 1 spray into both nostrils daily., Disp: , Rfl:    potassium chloride SA (KLOR-CON M) 20 MEQ tablet, Take 20 mEq by mouth daily as needed (With Furosemide)., Disp: , Rfl:    senna (SENOKOT) 8.6 MG TABS tablet, Take 1 tablet by mouth daily as needed for mild constipation., Disp: , Rfl:    simethicone (MYLICON) 80 MG chewable tablet, Chew 80 mg by mouth 4 (four) times daily as needed for flatulence., Disp: , Rfl:    Tiotropium Bromide-Olodaterol (STIOLTO RESPIMAT) 2.5-2.5 MCG/ACT AERS, Inhale 2 each into  the lungs 2 (two) times daily., Disp: , Rfl:    traMADol (ULTRAM) 50 MG tablet, Take 1 tablet (50 mg total) by mouth every 6 (six) hours as needed. (Patient not taking: Reported on 08/24/2023), Disp: 10 tablet, Rfl: 0  Past Medical History: Past Medical History:  Diagnosis Date   A-fib (HCC)    CAD (coronary artery disease)    Colitis    COPD (chronic obstructive pulmonary disease) (HCC)    Diabetes mellitus without complication (HCC)    Emphysema lung (HCC)    GERD (gastroesophageal reflux disease)    Hypertension    MI (myocardial infarction) (HCC) 1999    Tobacco Use: Social History   Tobacco Use  Smoking Status Former   Current packs/day: 0.00   Average packs/day: 1 pack/day for 30.0 years (30.0 ttl pk-yrs)   Types: Cigarettes   Start date: 12/19/1971   Quit date: 12/18/2001   Years since quitting: 21.8  Smokeless Tobacco Never    Labs: Review Flowsheet  More data exists      Latest Ref Rng & Units 05/02/2018 01/05/2019 12/06/2021 01/02/2023 01/03/2023  Labs for ITP Cardiac and Pulmonary Rehab  Cholestrol 0 - 200 mg/dL - - 409  - 98   LDL (calc) 0 - 99 mg/dL - - 51  - 53   HDL-C >81 mg/dL - - 33  - 29   Trlycerides <150 mg/dL - - 191  - 81   Hemoglobin A1c 4.8 - 5.6 % - - 5.9  6.0  -  Bicarbonate 20.0 - 28.0 mmol/L 36.3  29.6  - - -  O2 Saturation % - 81.7  - - -    Details             Pulmonary Assessment Scores:  Pulmonary Assessment Scores     Row Name 10/10/23 1552         ADL UCSD   ADL Phase Entry     SOB Score total 58     Rest 2     Walk 3     Stairs 5     Bath 4     Dress 2     Shop 3       CAT Score   CAT Score 24       mMRC Score   mMRC Score 1              UCSD: Self-administered rating of dyspnea associated with activities of daily living (ADLs) 6-point scale (0 = "not at all" to 5 = "maximal or unable to do because of breathlessness")  Scoring Scores range from 0 to 120.  Minimally important difference is 5 units  CAT: CAT  can identify the health impairment of COPD patients and is better correlated with disease progression.  CAT has a scoring range of zero to 40. The CAT score is classified into four groups of low (less than 10), medium (10 - 20), high (21-30) and very high (31-40) based on the impact level of disease on health status. A CAT score over 10 suggests significant symptoms.  A worsening CAT score could be explained by an exacerbation, poor medication adherence, poor inhaler technique, or progression of COPD or comorbid conditions.  CAT MCID is 2 points  mMRC: mMRC (Modified Medical Research Council) Dyspnea Scale is used to assess the degree of baseline functional disability in patients of respiratory disease due to dyspnea. No minimal important difference is established. A decrease in score of 1 point or greater is considered a positive change.   Pulmonary Function Assessment:   Exercise Target Goals: Exercise Program Goal: Individual exercise prescription set using results from initial 6 min walk test and THRR while considering  patient's activity barriers and safety.   Exercise Prescription Goal: Initial exercise prescription builds to 30-45 minutes a day of aerobic activity, 2-3 days per week.  Home exercise guidelines will be given to patient during program as part of exercise prescription that the participant will acknowledge.  Education: Aerobic Exercise: - Group verbal and visual presentation on the components of exercise prescription. Introduces F.I.T.T principle from ACSM for exercise prescriptions.  Reviews F.I.T.T. principles of aerobic exercise including progression. Written material given at graduation. Flowsheet Row Pulmonary Rehab from 11/03/2021 in [facility] Cardiac and Pulmonary Rehab  Date 10/13/21  Educator [facility]  Instruction Review Code 1- Verbalizes Understanding       Education: Resistance Exercise: - Group verbal and visual presentation on the components of exercise  prescription. Introduces F.I.T.T principle from ACSM for exercise prescriptions  Reviews F.I.T.T. principles of resistance exercise including progression. Written material given at graduation.    Education: Exercise & Equipment Safety: - Individual verbal instruction and demonstration of equipment use and safety with use of the equipment. Flowsheet Row Pulmonary Rehab from 10/10/2023 in [facility] Cardiac and Pulmonary Rehab  Date 10/10/23  Educator [facility]  Instruction Review Code 1- Verbalizes Understanding       Education: Exercise Physiology & General Exercise Guidelines: - Group verbal and written instruction with models to review the exercise physiology of the cardiovascular system and associated critical values. Provides general exercise guidelines with specific guidelines to those with heart or lung disease.  Flowsheet Row Pulmonary Rehab from 10/10/2023 in [facility] Cardiac and Pulmonary Rehab  Education need identified 10/10/23       Education: Flexibility, Balance, Mind/Body Relaxation: - Group verbal and visual presentation with interactive activity on the components of exercise prescription. Introduces F.I.T.T principle from ACSM for exercise prescriptions. Reviews F.I.T.T. principles of flexibility and balance exercise training including progression. Also discusses the mind body connection.  Reviews various relaxation techniques to help reduce and manage stress (i.e. Deep breathing, progressive muscle relaxation, and visualization). Balance handout provided to take home. Written material given at graduation. Flowsheet Row Pulmonary Rehab from 11/03/2021 in [facility] Cardiac and Pulmonary Rehab  Date 10/27/21  Educator [facility]  Instruction Review Code 1- Verbalizes Understanding       Activity Barriers & Risk Stratification:  Activity Barriers & Cardiac Risk Stratification - 10/10/23 1555       Activity Barriers & Cardiac Risk Stratification   Activity Barriers Joint Problems;Balance  Concerns;Deconditioning;Shortness of  Breath;Muscular Weakness    Cardiac Risk Stratification Low             6 Minute Walk:  6 Minute Walk     Row Name 10/10/23 1552         6 Minute Walk   Phase Initial     Distance 615 feet     Walk Time 5.54 minutes     # of Rest Breaks 1     MPH 1.2     METS 1.1     RPE 13     Perceived Dyspnea  1     VO2 Peak 3.76     Symptoms No     Resting HR 58 bpm     Resting BP 106/52     Resting Oxygen Saturation  96 %     Exercise Oxygen Saturation  during 6 min walk 88 %     Max Ex. HR 68 bpm     Max Ex. BP 126/60     2 Minute Post BP 108/48       Interval HR   1 Minute HR 64     2 Minute HR 66     3 Minute HR 67     4 Minute HR 68     5 Minute HR 68     6 Minute HR 68     2 Minute Post HR 58     Interval Heart Rate? Yes       Interval Oxygen   Interval Oxygen? Yes     Baseline Oxygen Saturation % 96 %     1 Minute Oxygen Saturation % 92 %     1 Minute Liters of Oxygen 4 L     2 Minute Oxygen Saturation % 89 %     2 Minute Liters of Oxygen 4 L     3 Minute Oxygen Saturation % 91 %     3 Minute Liters of Oxygen 4 L     4 Minute Oxygen Saturation % 89 %     4 Minute Liters of Oxygen 4 L     5 Minute Oxygen Saturation % 88 %     5 Minute Liters of Oxygen 4 L     6 Minute Oxygen Saturation % 91 %     6 Minute Liters of Oxygen 4 L     2 Minute Post Oxygen Saturation % 97 %     2 Minute Post Liters of Oxygen 3 L             Oxygen Initial Assessment:  Oxygen Initial Assessment - 10/04/23 1110       Home Oxygen   Home Oxygen Device Home Concentrator;E-Tanks    Sleep Oxygen Prescription Continuous    Liters per minute 3    Home Exercise Oxygen Prescription Continuous    Liters per minute 3    Home Resting Oxygen Prescription Continuous    Liters per minute 3    Compliance with Home Oxygen Use Yes      Intervention   Short Term Goals To learn and exhibit compliance with exercise, home and travel O2 prescription;To  learn and understand importance of monitoring SPO2 with pulse oximeter and demonstrate accurate use of the pulse oximeter.;To learn and understand importance of maintaining oxygen saturations>88%;To learn and demonstrate proper pursed lip breathing techniques or other breathing techniques. ;To learn and demonstrate proper use of respiratory medications    Long  Term Goals Exhibits compliance with  exercise, home  and travel O2 prescription;Verbalizes importance of monitoring SPO2 with pulse oximeter and return demonstration;Maintenance of O2 saturations>88%;Exhibits proper breathing techniques, such as pursed lip breathing or other method taught during program session;Compliance with respiratory medication;Demonstrates proper use of MDI's             Oxygen Re-Evaluation:  Oxygen Re-Evaluation     Row Name 10/16/23 1128             Program Oxygen Prescription   Program Oxygen Prescription Continuous;E-Tanks       Liters per minute 3         Home Oxygen   Home Oxygen Device Home Concentrator;E-Tanks       Sleep Oxygen Prescription Continuous       Liters per minute 3       Home Exercise Oxygen Prescription Continuous       Liters per minute 3       Home Resting Oxygen Prescription Continuous       Liters per minute 3       Compliance with Home Oxygen Use Yes         Goals/Expected Outcomes   Short Term Goals To learn and demonstrate proper pursed lip breathing techniques or other breathing techniques.        Long  Term Goals Exhibits proper breathing techniques, such as pursed lip breathing or other method taught during program session       Comments Reviewed PLB technique with pt.  Talked about how it works and it's importance in maintaining their exercise saturations.       Goals/Expected Outcomes Short: Become more profiecient at using PLB. Long: Become independent at using PLB.                Oxygen Discharge (Final Oxygen Re-Evaluation):  Oxygen Re-Evaluation -  10/16/23 1128       Program Oxygen Prescription   Program Oxygen Prescription Continuous;E-Tanks    Liters per minute 3      Home Oxygen   Home Oxygen Device Home Concentrator;E-Tanks    Sleep Oxygen Prescription Continuous    Liters per minute 3    Home Exercise Oxygen Prescription Continuous    Liters per minute 3    Home Resting Oxygen Prescription Continuous    Liters per minute 3    Compliance with Home Oxygen Use Yes      Goals/Expected Outcomes   Short Term Goals To learn and demonstrate proper pursed lip breathing techniques or other breathing techniques.     Long  Term Goals Exhibits proper breathing techniques, such as pursed lip breathing or other method taught during program session    Comments Reviewed PLB technique with pt.  Talked about how it works and it's importance in maintaining their exercise saturations.    Goals/Expected Outcomes Short: Become more profiecient at using PLB. Long: Become independent at using PLB.             Initial Exercise Prescription:  Initial Exercise Prescription - 10/10/23 1500       Date of Initial Exercise RX and Referring Provider   Date 10/10/23    Referring Provider Dr. Mliss Sax      Oxygen   Oxygen Continuous    Liters 3L    Maintain Oxygen Saturation 88% or higher      Treadmill   MPH 1.2    Grade 0    Minutes 15    METs 1.1      NuStep  Level 1    SPM 80    Minutes 15    METs 1.1      T5 Nustep   Level 1    SPM 80    Minutes 15    METs 1.1      Prescription Details   Duration Progress to 30 minutes of continuous aerobic without signs/symptoms of physical distress      Intensity   THRR 40-80% of Max Heartrate 94-130    Ratings of Perceived Exertion 11-13    Perceived Dyspnea 0-4      Progression   Progression Continue to progress workloads to maintain intensity without signs/symptoms of physical distress.      Resistance Training   Training Prescription Yes    Weight 3 lb    Reps  10-15             Perform Capillary Blood Glucose checks as needed.  Exercise Prescription Changes:   Exercise Prescription Changes     Row Name 10/10/23 1500             Response to Exercise   Blood Pressure (Admit) 106/52       Blood Pressure (Exercise) 126/60       Blood Pressure (Exit) 108/48       Heart Rate (Admit) 58 bpm       Heart Rate (Exercise) 68 bpm       Heart Rate (Exit) 58 bpm       Oxygen Saturation (Admit) 96 %       Oxygen Saturation (Exercise) 88 %       Oxygen Saturation (Exit) 97 %       Rating of Perceived Exertion (Exercise) 13       Perceived Dyspnea (Exercise) 1       Symptoms none       Comments results       Duration Progress to 30 minutes of  aerobic without signs/symptoms of physical distress       Intensity THRR New         Progression   Progression Continue to progress workloads to maintain intensity without signs/symptoms of physical distress.       Average METs 1.1                Exercise Comments:   Exercise Comments     Row Name 10/16/23 1126           Exercise Comments First full day of exercise!  Patient was oriented to gym and equipment including functions, settings, policies, and procedures.  Patient's individual exercise prescription and treatment plan were reviewed.  All starting workloads were established based on the results of the 6 minute walk test done at initial orientation visit.  The plan for exercise progression was also introduced and progression will be customized based on patient's performance and goals.                Exercise Goals and Review:   Exercise Goals     Row Name 10/10/23 1600             Exercise Goals   Increase Physical Activity Yes       Intervention Provide advice, education, support and counseling about physical activity/exercise needs.;Develop an individualized exercise prescription for aerobic and resistive training based on initial evaluation findings, risk  stratification, comorbidities and participant's personal goals.       Expected Outcomes Short Term: Attend rehab on a regular basis to increase  amount of physical activity.;Long Term: Add in home exercise to make exercise part of routine and to increase amount of physical activity.;Long Term: Exercising regularly at least 3-5 days a week.       Increase Strength and Stamina Yes       Intervention Provide advice, education, support and counseling about physical activity/exercise needs.;Develop an individualized exercise prescription for aerobic and resistive training based on initial evaluation findings, risk stratification, comorbidities and participant's personal goals.       Expected Outcomes Short Term: Increase workloads from initial exercise prescription for resistance, speed, and METs.;Short Term: Perform resistance training exercises routinely during rehab and add in resistance training at home;Long Term: Improve cardiorespiratory fitness, muscular endurance and strength as measured by increased METs and functional capacity ( )       Able to understand and use rate of perceived exertion (RPE) scale Yes       Intervention Provide education and explanation on how to use RPE scale       Expected Outcomes Short Term: Able to use RPE daily in rehab to express subjective intensity level;Long Term:  Able to use RPE to guide intensity level when exercising independently       Able to understand and use Dyspnea scale Yes       Intervention Provide education and explanation on how to use Dyspnea scale       Expected Outcomes Short Term: Able to use Dyspnea scale daily in rehab to express subjective sense of shortness of breath during exertion;Long Term: Able to use Dyspnea scale to guide intensity level when exercising independently       Knowledge and understanding of Target Heart Rate Range (THRR) Yes       Intervention Provide education and explanation of THRR including how the numbers were predicted  and where they are located for reference       Expected Outcomes Short Term: Able to state/look up THRR;Short Term: Able to use daily as guideline for intensity in rehab;Long Term: Able to use THRR to govern intensity when exercising independently       Able to check pulse independently Yes       Intervention Provide education and demonstration on how to check pulse in carotid and radial arteries.;Review the importance of being able to check your own pulse for safety during independent exercise       Expected Outcomes Short Term: Able to explain why pulse checking is important during independent exercise;Long Term: Able to check pulse independently and accurately       Understanding of Exercise Prescription Yes       Intervention Provide education, explanation, and written materials on patient's individual exercise prescription       Expected Outcomes Short Term: Able to explain program exercise prescription;Long Term: Able to explain home exercise prescription to exercise independently                Exercise Goals Re-Evaluation :  Exercise Goals Re-Evaluation     Row Name 10/16/23 1127             Exercise Goal Re-Evaluation   Exercise Goals Review Able to understand and use rate of perceived exertion (RPE) scale;Knowledge and understanding of Target Heart Rate Range (THRR);Able to understand and use Dyspnea scale;Understanding of Exercise Prescription       Comments Reviewed RPE and dyspnea scale, THR and program prescription with pt today.  Pt voiced understanding and was given a copy of goals to take home.  Expected Outcomes Short: Use RPE daily to regulate intensity. Long: Follow program prescription in THR.                Discharge Exercise Prescription (Final Exercise Prescription Changes):  Exercise Prescription Changes - 10/10/23 1500       Response to Exercise   Blood Pressure (Admit) 106/52    Blood Pressure (Exercise) 126/60    Blood Pressure (Exit) 108/48     Heart Rate (Admit) 58 bpm    Heart Rate (Exercise) 68 bpm    Heart Rate (Exit) 58 bpm    Oxygen Saturation (Admit) 96 %    Oxygen Saturation (Exercise) 88 %    Oxygen Saturation (Exit) 97 %    Rating of Perceived Exertion (Exercise) 13    Perceived Dyspnea (Exercise) 1    Symptoms none    Comments results    Duration Progress to 30 minutes of  aerobic without signs/symptoms of physical distress    Intensity THRR New      Progression   Progression Continue to progress workloads to maintain intensity without signs/symptoms of physical distress.    Average METs 1.1             Nutrition:  Target Goals: Understanding of nutrition guidelines, daily intake of sodium 1500mg , cholesterol 200mg , calories 30% from fat and 7% or less from saturated fats, daily to have 5 or more servings of fruits and vegetables.  Education: All About Nutrition: -Group instruction provided by verbal, written material, interactive activities, discussions, models, and posters to present general guidelines for heart healthy nutrition including fat, fiber, MyPlate, the role of sodium in heart healthy nutrition, utilization of the nutrition label, and utilization of this knowledge for meal planning. Follow up email sent as well. Written material given at graduation. Flowsheet Row Pulmonary Rehab from 07/27/2022 in [facility] Cardiac and Pulmonary Rehab  Education need identified 06/19/22       Biometrics:  Pre Biometrics - 10/10/23 1600       Pre Biometrics   Height 5' 5.6" (1.666 m)    Weight 185 lb 11.2 oz (84.2 kg)    Waist Circumference 43 inches    Hip Circumference 44.5 inches    Waist to Hip Ratio 0.97 %    BMI (Calculated) 30.35    Single Leg Stand 0 seconds              Nutrition Therapy Plan and Nutrition Goals:   Nutrition Assessments:  MEDIFICTS Score Key: >=70 Need to make dietary changes  40-70 Heart Healthy Diet <= 40 Therapeutic Level Cholesterol Diet  Flowsheet Row  Pulmonary Rehab from 10/10/2023 in [facility] Cardiac and Pulmonary Rehab  Picture Your Plate Total Score on Admission 58      Picture Your Plate Scores: <64 Unhealthy dietary pattern with much room for improvement. 41-50 Dietary pattern unlikely to meet recommendations for good health and room for improvement. 51-60 More healthful dietary pattern, with some room for improvement.  >60 Healthy dietary pattern, although there may be some specific behaviors that could be improved.   Nutrition Goals Re-Evaluation:   Nutrition Goals Discharge (Final Nutrition Goals Re-Evaluation):   Psychosocial: Target Goals: Acknowledge presence or absence of significant depression and/or stress, maximize coping skills, provide positive support system. Participant is able to verbalize types and ability to use techniques and skills needed for reducing stress and depression.   Education: Stress, Anxiety, and Depression - Group verbal and visual presentation to define topics covered.  Reviews how  body is impacted by stress, anxiety, and depression.  Also discusses healthy ways to reduce stress and to treat/manage anxiety and depression.  Written material given at graduation. Flowsheet Row Pulmonary Rehab from 11/03/2021 in [facility] Card[facility] and Pulmonary Rehab  Date 07/28/21  Educator [facility]  Instruction Review Code 1- [employer] Understanding       Education: Sleep Hygiene -Provides group verbal and written instruction about how sleep can affect your health.  Define sleep hygiene, discuss sleep cycles and impact of sleep habits. Review good sleep hygiene tips.    Initial Review & Psychosocial Screening:  Initial Psych Review & Screening - 10/04/23 1111       Initial Review   Current issues with History of Depression;Current Psychotropic Meds;Current Sleep Concerns      Family Dynamics   Good Support System? Yes   wife, older sister   Concerns Recent loss of significant other   youngest sister passed away 6  months a go,  nephew died 3 months ago.     Barriers   Psychosocial barriers to participate in program There are no identifiable barriers or psychosocial needs.      Screening Interventions   Interventions Encouraged to exercise;To provide support and resources with identified psychosocial needs;Provide feedback about the scores to participant    Expected Outcomes Short Term goal: Utilizing psychosocial counselor, staff and physician to assist with identification of specific Stressors or current issues interfering with healing process. Setting desired goal for each stressor or current issue identified.;Long Term Goal: Stressors or current issues are controlled or eliminated.;Short Term goal: Identification and review with participant of any Quality of Life or Depression concerns found by scoring the questionnaire.;Long Term goal: The participant improves quality of Life and PHQ9 Scores as seen by post scores and/or verbalization of changes             Quality of Life Scores:  Scores of 19 and below usually indicate a poorer quality of life in these areas.  A difference of  2-3 points is a clinically meaningful difference.  A difference of 2-3 points in the total score of the Quality of Life Index has been associated with significant improvement in overall quality of life, self-image, physical symptoms, and general health in studies assessing change in quality of life.  PHQ-9: Review Flowsheet  More data exists      10/10/2023 11/29/2022 07/18/2022 06/19/2022 11/17/2021  Depression screen PHQ 2/9  Decreased Interest 1 1 1 2 1   Down, Depressed, Hopeless 0 0 0 1 0  PHQ - 2 Score 1 1 1 3 1   Altered sleeping 2 3 3 2 2   Tired, decreased energy 1 2 2 2 1   Change in appetite 0 0 0 0 1  Feeling bad or failure about yourself  0 1 0 0 0  Trouble concentrating 0 0 0 0 0  Moving slowly or fidgety/restless 0 0 0 0 0  Suicidal thoughts 0 0 0 0 0  PHQ-9 Score 4 7 6 7 5   Difficult doing work/chores  Somewhat difficult Somewhat difficult Somewhat difficult Not difficult at all Somewhat difficult    Details           Interpretation of Total Score  Total Score Depression Severity:  1-4 = Minimal depression, 5-9 = Mild depression, 10-14 = Moderate depression, 15-19 = Moderately severe depression, 20-27 = Severe depression   Psychosocial Evaluation and Intervention:  Psychosocial Evaluation - 10/04/23 1131       Psychosocial Evaluation & Interventions  Interventions Encouraged to exercise with the program and follow exercise prescription    Comments MIke has no barriers to attending the program. He has a history of depression and is taking meds that have his symptoms well controlled.  He is having trouble sleeping. He has to wake up to relieve the stuffiness  from the use of oxygen in his nose. Advised him to talk to his doctor about some humidity at night for his oxygen or look for saline gel for his nostrils to help keep them moist.  . He stated he had some gel and will try it again.  MIke had 2 family members pass away in the last year. One was his younger sister and the other a nephew.   He stated he was doing okay fter their deaths.  He lives with his wife and she and his older sister are his support people.  His wife continues to workand he  experessed he thought it may be hard for her.  He is ready to return to the program.             Psychosocial Re-Evaluation:   Psychosocial Discharge (Final Psychosocial Re-Evaluation):   Education: Education Goals: Education classes will be provided on a weekly basis, covering required topics. Participant will state understanding/return demonstration of topics presented.  Learning Barriers/Preferences:   General Pulmonary Education Topics:  Infection Prevention: - Provides verbal and written material to individual with discussion of infection control including proper hand washing and proper equipment cleaning during exercise  session. Flowsheet Row Pulmonary Rehab from 10/10/2023 in [facility] Cardiac and Pulmonary Rehab  Date 10/10/23  Educator [facility]  Instruction Review Code 1- Verbalizes Understanding       Falls Prevention: - Provides verbal and written material to individual with discussion of falls prevention and safety. Flowsheet Row Pulmonary Rehab from 10/10/2023 in [facility] Cardiac and Pulmonary Rehab  Date 10/10/23  Educator [facility]  Instruction Review Code 1- Verbalizes Understanding       Chronic Lung Disease Review: - Group verbal instruction with posters, models, PowerPoint presentations and videos,  to review new updates, new respiratory medications, new advancements in procedures and treatments. Providing information on websites and "800" numbers for continued self-education. Includes information about supplement oxygen, available portable oxygen systems, continuous and intermittent flow rates, oxygen safety, concentrators, and Medicare reimbursement for oxygen. Explanation of Pulmonary Drugs, including class, frequency, complications, importance of spacers, rinsing mouth after steroid MDI's, and proper cleaning methods for nebulizers. Review of basic lung anatomy and physiology related to function, structure, and complications of lung disease. Review of risk factors. Discussion about methods for diagnosing sleep apnea and types of masks and machines for OSA. Includes a review of the use of types of environmental controls: home humidity, furnaces, filters, dust mite/pet prevention, HEPA vacuums. Discussion about weather changes, air quality and the benefits of nasal washing. Instruction on Warning signs, infection symptoms, calling MD promptly, preventive modes, and value of vaccinations. Review of effective airway clearance, coughing and/or vibration techniques. Emphasizing that all should Create an Action Plan. Written material given at graduation. Flowsheet Row Pulmonary Rehab from 10/10/2023 in [facility] Cardiac and  Pulmonary Rehab  Education need identified 10/10/23       AED/CPR: - Group verbal and written instruction with the use of models to demonstrate the basic use of the AED with the basic ABC's of resuscitation. Flowsheet Row Pulmonary Rehab from 02/26/2019 in [facility] Cardiac and Pulmonary Rehab  Date 01/01/19  Educator [facility]  Instruction Review Code  1- Verbalizes Understanding        Anatomy and Cardiac Procedures: - Group verbal and visual presentation and models provide information about basic cardiac anatomy and function. Reviews the testing methods done to diagnose heart disease and the outcomes of the test results. Describes the treatment choices: Medical Management, Angioplasty, or Coronary Bypass Surgery for treating various heart conditions including Myocardial Infarction, Angina, Valve Disease, and Cardiac Arrhythmias.  Written material given at graduation.   Medication Safety: - Group verbal and visual instruction to review commonly prescribed medications for heart and lung disease. Reviews the medication, class of the drug, and side effects. Includes the steps to properly store meds and maintain the prescription regimen.  Written material given at graduation. Flowsheet Row Pulmonary Rehab from 11/03/2021 in [facility] Cardiac and Pulmonary Rehab  Date 09/01/21  Educator [facility]  Instruction Review Code 1- Verbalizes Understanding       Other: -Provides group and verbal instruction on various topics (see comments)   Knowledge Questionnaire Score:  Knowledge Questionnaire Score - 10/10/23 1555       Knowledge Questionnaire Score   Pre Score 15/18              Core Components/Risk Factors/Patient Goals at Admission:  Personal Goals and Risk Factors at Admission - 10/04/23 1120       Core Components/Risk Factors/Patient Goals on Admission    Weight Management Yes    Intervention Weight Management: Develop a combined nutrition and exercise program designed to reach desired  caloric intake, while maintaining appropriate intake of nutrient and fiber, sodium and fats, and appropriate energy expenditure required for the weight goal.;Weight Management: Provide education and appropriate resources to help participant work on and attain dietary goals.    Admit Weight 179 lb (81.2 kg)    Goal Weight: Long Term 150 lb (68 kg)    Expected Outcomes Short Term: Continue to assess and modify interventions until short term weight is achieved;Long Term: Adherence to nutrition and physical activity/exercise program aimed toward attainment of established weight goal;Weight Loss: Understanding of general recommendations for a balanced deficit meal plan, which promotes 1-2 lb weight loss per week and includes a negative energy balance of [id number] kcal/d    Improve shortness of breath with ADL's Yes    Intervention Provide education, individualized exercise plan and daily activity instruction to help decrease symptoms of SOB with activities of daily living.    Expected Outcomes Short Term: Improve cardiorespiratory fitness to achieve a reduction of symptoms when performing ADLs;Long Term: Be able to perform more ADLs without symptoms or delay the onset of symptoms    Increase knowledge of respiratory medications and ability to use respiratory devices properly  Yes    Intervention Provide education and demonstration as needed of appropriate use of medications, inhalers, and oxygen therapy.    Expected Outcomes Short Term: Achieves understanding of medications use. Understands that oxygen is a medication prescribed by physician. Demonstrates appropriate use of inhaler and oxygen therapy.;Long Term: Maintain appropriate use of medications, inhalers, and oxygen therapy.    Diabetes Yes    Intervention Provide education about signs/symptoms and action to take for hypo/hyperglycemia.;Provide education about proper nutrition, including hydration, and aerobic/resistive exercise prescription along with  prescribed medications to achieve blood glucose in normal ranges: Fasting glucose 65-99 mg/dL    Expected Outcomes Short Term: Participant verbalizes understanding of the signs/symptoms and immediate care of hyper/hypoglycemia, proper foot care and importance of medication, aerobic/resistive exercise and nutrition plan for blood glucose control.;Long  Term: Attainment of HbA1C < 7%.    Hypertension Yes    Intervention Provide education on lifestyle modifcations including regular physical activity/exercise, weight management, moderate sodium restriction and increased consumption of fresh fruit, vegetables, and low fat dairy, alcohol moderation, and smoking cessation.;Monitor prescription use compliance.    Expected Outcomes Short Term: Continued assessment and intervention until BP is < 140/49mm HG in hypertensive participants. < 130/59mm HG in hypertensive participants with diabetes, heart failure or chronic kidney disease.;Long Term: Maintenance of blood pressure at goal levels.    Lipids Yes    Intervention Provide education and support for participant on nutrition & aerobic/resistive exercise along with prescribed medications to achieve LDL 70mg , HDL >40mg .    Expected Outcomes Short Term: Participant states understanding of desired cholesterol values and is compliant with medications prescribed. Participant is following exercise prescription and nutrition guidelines.;Long Term: Cholesterol controlled with medications as prescribed, with individualized exercise RX and with personalized nutrition plan. Value goals: LDL < 70mg , HDL > 40 mg.             Education:Diabetes - Individual verbal and written instruction to review signs/symptoms of diabetes, desired ranges of glucose level fasting, after meals and with exercise. Acknowledge that pre and post exercise glucose checks will be done for 3 sessions at entry of program. Flowsheet Row Pulmonary Rehab from 07/27/2022 in [facility] Cardiac and Pulmonary  Rehab  Date 05/08/22  Educator [facility]  Instruction Review Code 1- Verbalizes Understanding       Know Your Numbers and Heart Failure: - Group verbal and visual instruction to discuss disease risk factors for cardiac and pulmonary disease and treatment options.  Reviews associated critical values for Overweight/Obesity, Hypertension, Cholesterol, and Diabetes.  Discusses basics of heart failure: signs/symptoms and treatments.  Introduces Heart Failure Zone chart for action plan for heart failure.  Written material given at graduation. Flowsheet Row Pulmonary Rehab from 07/27/2022 in [facility] Cardiac and Pulmonary Rehab  Date 07/27/22  Educator [facility]  Instruction Review Code 1- Verbalizes Understanding       Core Components/Risk Factors/Patient Goals Review:    Core Components/Risk Factors/Patient Goals at Discharge (Final Review):    ITP Comments:  ITP Comments     Row Name 10/04/23 1124 10/04/23 1138 10/10/23 1551 10/16/23 1126 10/17/23 0853   ITP Comments Virtual orientation call completed today. he has an appointment on Date: 10/10/2023  for EP eval and gym Orientation.  Documentation of diagnosis can be found in [facility]  CE Date: 07/24/2023   08/23/2023 . Virtual orientation call completed today. he has an appointment on Date: 10/10/2023  for EP eval and gym Orientation.  Documentation of diagnosis can be found in [facility]  CE Date: 07/24/2023   08/23/2023 .     Kathlene November was hospitalized for Covid in Sept. He is still gaining back his energy after discharge. Completed and gym orientation. Initial ITP created and sent for review to Dr. Jinny Sanders, Medical Director. First full day of exercise!  Patient was oriented to gym and equipment including functions, settings, policies, and procedures.  Patient's individual exercise prescription and treatment plan were reviewed.  All starting workloads were established based on the results of the 6 minute walk test done at initial orientation visit.  The plan for exercise  progression was also introduced and progression will be customized based on patient's performance and goals. 30 Day review completed. Medical Director ITP review done, changes made as directed, and signed approval by Medical Director.    new  to program            Comments:

## 2023-10-18 ENCOUNTER — Encounter: Payer: No Typology Code available for payment source | Admitting: *Deleted

## 2023-10-18 DIAGNOSIS — Z5189 Encounter for other specified aftercare: Secondary | ICD-10-CM | POA: Diagnosis not present

## 2023-10-18 DIAGNOSIS — R0602 Shortness of breath: Secondary | ICD-10-CM

## 2023-10-18 LAB — GLUCOSE, CAPILLARY
Glucose-Capillary: 115 mg/dL — ABNORMAL HIGH (ref 70–99)
Glucose-Capillary: 88 mg/dL (ref 70–99)

## 2023-10-18 NOTE — Progress Notes (Signed)
Daily Session Note  Patient Details  Name: Darius Norris MRN: [id number] Date of Birth: 08/31/1951 Referring Provider:   Flowsheet Row Pulmonary Rehab from 10/10/2023 in [facility] Cardiac and Pulmonary Rehab  Referring Provider Dr. Mliss Sax       Encounter Date: 10/18/2023  Check In:  Session Check In - 10/18/23 1106       Check-In   Supervising physician immediately available to respond to emergencies See telemetry face sheet for immediately available ER MD    Location [facility]-Cardiac & Pulmonary Rehab    Staff Present Ronette Deter, BS, Exercise Physiologist;Meredith Jewel Baize, RN BSN;Joseph Shelbie Proctor, RN, [place]    Virtual Visit No    Medication changes reported     No    Fall or balance concerns reported    No    Warm-up and Cool-down Performed on first and last piece of equipment    Resistance Training Performed Yes    VAD Patient? No    PAD/SET Patient? No      Pain Assessment   Currently in Pain? No/denies                Social History   Tobacco Use  Smoking Status Former   Current packs/day: 0.00   Average packs/day: 1 pack/day for 30.0 years (30.0 ttl pk-yrs)   Types: Cigarettes   Start date: 12/19/1971   Quit date: 12/18/2001   Years since quitting: 21.8  Smokeless Tobacco Never    Goals Met:  Independence with exercise equipment Exercise tolerated well No report of concerns or symptoms today Strength training completed today  Goals Unmet:  Not Applicable  Comments: Pt able to follow exercise prescription today without complaint.  Will continue to monitor for progression.    Dr. Bethann Punches is Medical Director for [facility] Cardiac Rehabilitation.  Dr. Vida Rigger is Medical Director for [facility] Pulmonary Rehabilitation.

## 2023-10-23 ENCOUNTER — Encounter: Payer: No Typology Code available for payment source | Attending: Cardiology | Admitting: *Deleted

## 2023-10-23 DIAGNOSIS — R0602 Shortness of breath: Secondary | ICD-10-CM

## 2023-10-23 DIAGNOSIS — J449 Chronic obstructive pulmonary disease, unspecified: Secondary | ICD-10-CM | POA: Diagnosis present

## 2023-10-23 LAB — GLUCOSE, CAPILLARY
Glucose-Capillary: 140 mg/dL — ABNORMAL HIGH (ref 70–99)
Glucose-Capillary: 120 mg/dL — ABNORMAL HIGH (ref 70–99)

## 2023-10-23 NOTE — Progress Notes (Signed)
Daily Session Note  Patient Details  Name: Darius Norris MRN: [id number] Date of Birth: 1951/07/30 Referring Provider:   Flowsheet Row Pulmonary Rehab from 10/10/2023 in [facility] Cardiac and Pulmonary Rehab  Referring Provider Dr. Mliss Sax       Encounter Date: 10/23/2023  Check In:  Session Check In - 10/23/23 1114       Check-In   Supervising physician immediately available to respond to emergencies See telemetry face sheet for immediately available ER MD    Location [facility]-Cardiac & Pulmonary Rehab    Staff Present Cora Collum, RN, BSN, CCRP;Meredith Jewel Baize, RN BSN;Noah Tickle, BS, Exercise Physiologist;Maxon Conetta BS, , Exercise Physiologist    Virtual Visit No    Medication changes reported     No    Fall or balance concerns reported    No    Warm-up and Cool-down Performed on first and last piece of equipment    Resistance Training Performed Yes    VAD Patient? No    PAD/SET Patient? No      Pain Assessment   Currently in Pain? No/denies                Social History   Tobacco Use  Smoking Status Former   Current packs/day: 0.00   Average packs/day: 1 pack/day for 30.0 years (30.0 ttl pk-yrs)   Types: Cigarettes   Start date: 12/19/1971   Quit date: 12/18/2001   Years since quitting: 21.8  Smokeless Tobacco Never    Goals Met:  Proper associated with RPD/PD & O2 Sat Independence with exercise equipment Exercise tolerated well No report of concerns or symptoms today  Goals Unmet:  Not Applicable  Comments: Pt able to follow exercise prescription today without complaint.  Will continue to monitor for progression.    Dr. Bethann Punches is Medical Director for [facility] Cardiac Rehabilitation.  Dr. Vida Rigger is Medical Director for [facility] Pulmonary Rehabilitation.

## 2023-10-25 ENCOUNTER — Encounter: Payer: No Typology Code available for payment source | Admitting: *Deleted

## 2023-10-25 DIAGNOSIS — J449 Chronic obstructive pulmonary disease, unspecified: Secondary | ICD-10-CM

## 2023-10-25 DIAGNOSIS — R0602 Shortness of breath: Secondary | ICD-10-CM

## 2023-10-25 NOTE — Progress Notes (Signed)
Daily Session Note  Patient Details  Name: Darius Norris MRN: [id number] Date of Birth: 1951/01/01 Referring Provider:   Flowsheet Row Pulmonary Rehab from 10/10/2023 in [facility] Cardiac and Pulmonary Rehab  Referring Provider Dr. Mliss Sax       Encounter Date: 10/25/2023  Check In:  Session Check In - 10/25/23 1049       Check-In   Supervising physician immediately available to respond to emergencies See telemetry face sheet for immediately available ER MD    Location [facility]-Cardiac & Pulmonary Rehab    Staff Present Ronette Deter, BS, Exercise Physiologist;Susanne Bice, RN, BSN, CCRP;Meredith Greenview, RN BSN;Joseph Winner, Guinevere Ferrari, RN, [place]    Virtual Visit No    Medication changes reported     No    Fall or balance concerns reported    No    Warm-up and Cool-down Performed on first and last piece of equipment    Resistance Training Performed Yes    VAD Patient? No    PAD/SET Patient? No      Pain Assessment   Currently in Pain? No/denies                Social History   Tobacco Use  Smoking Status Former   Current packs/day: 0.00   Average packs/day: 1 pack/day for 30.0 years (30.0 ttl pk-yrs)   Types: Cigarettes   Start date: 12/19/1971   Quit date: 12/18/2001   Years since quitting: 21.8  Smokeless Tobacco Never    Goals Met:  Independence with exercise equipment Exercise tolerated well No report of concerns or symptoms today Strength training completed today  Goals Unmet:  Not Applicable  Comments: Pt able to follow exercise prescription today without complaint.  Will continue to monitor for progression.    Dr. Bethann Punches is Medical Director for [facility] Cardiac Rehabilitation.  Dr. Vida Rigger is Medical Director for [facility] Pulmonary Rehabilitation.

## 2023-10-30 DIAGNOSIS — J449 Chronic obstructive pulmonary disease, unspecified: Secondary | ICD-10-CM

## 2023-10-30 DIAGNOSIS — R0602 Shortness of breath: Secondary | ICD-10-CM

## 2023-10-30 NOTE — Progress Notes (Signed)
Daily Session Note  Patient Details  Name: Darius Norris MRN: [id number] Date of Birth: 1951-09-23 Referring Provider:   Flowsheet Row Pulmonary Rehab from 10/10/2023 in [facility] Cardiac and Pulmonary Rehab  Referring Provider Dr. Mliss Sax       Encounter Date: 10/30/2023  Check In:  Session Check In - 10/30/23 1115       Check-In   Supervising physician immediately available to respond to emergencies See telemetry face sheet for immediately available ER MD    Location [facility]-Cardiac & Pulmonary Rehab    Staff Present Maxon Conetta BS, , Exercise Physiologist;Dineen Conradt Clover Mealy, RN, BSN;Meredith Jewel Baize, RN BSN;Jason Wallace Cullens, RDN, LDN    Virtual Visit No    Medication changes reported     No    Fall or balance concerns reported    No    Tobacco Cessation No Change    Warm-up and Cool-down Performed on first and last piece of equipment    Resistance Training Performed Yes    VAD Patient? No    PAD/SET Patient? No      Pain Assessment   Currently in Pain? No/denies                Social History   Tobacco Use  Smoking Status Former   Current packs/day: 0.00   Average packs/day: 1 pack/day for 30.0 years (30.0 ttl pk-yrs)   Types: Cigarettes   Start date: 12/19/1971   Quit date: 12/18/2001   Years since quitting: 21.8  Smokeless Tobacco Never    Goals Met:  Proper associated with RPD/PD & O2 Sat Independence with exercise equipment Using PLB without cueing & demonstrates good technique Exercise tolerated well No report of concerns or symptoms today  Goals Unmet:  Not Applicable  Comments: Pt able to follow exercise prescription today without complaint.  Will continue to monitor for progression.   Dr. Bethann Punches is Medical Director for [facility] Cardiac Rehabilitation.  Dr. Vida Rigger is Medical Director for [facility] Pulmonary Rehabilitation.

## 2023-11-01 ENCOUNTER — Encounter: Payer: No Typology Code available for payment source | Admitting: *Deleted

## 2023-11-06 ENCOUNTER — Encounter: Payer: No Typology Code available for payment source | Admitting: *Deleted

## 2023-11-06 DIAGNOSIS — R0602 Shortness of breath: Secondary | ICD-10-CM

## 2023-11-06 DIAGNOSIS — J449 Chronic obstructive pulmonary disease, unspecified: Secondary | ICD-10-CM

## 2023-11-06 NOTE — Progress Notes (Signed)
Daily Session Note  Patient Details  Name: ELLIAN DURKEE MRN: [id number] Date of Birth: January 10, 1951 Referring Provider:   Flowsheet Row Pulmonary Rehab from 10/10/2023 in [facility]  Referring Provider Dr. Mliss Sax       Encounter Date: 11/06/2023  Check In:  Session Check In - 11/06/23 1134       Check-In   Supervising physician immediately available to respond to emergencies See telemetry face sheet for immediately available ER MD    Location [facility]-Cardiac & Pulmonary [facility]    Staff Present Cora Collum, RN, BSN, CCRP;Jason Wallace Cullens, RDN, LDN;Meredith Jewel Baize, RN BSN;Maxon Conetta BS, , Exercise Physiologist    Virtual Visit No    Medication changes reported     No    Fall or balance concerns reported    No    Warm-up and Cool-down Performed on first and last piece of equipment    Resistance Training Performed Yes    VAD Patient? No    PAD/SET Patient? No      Pain Assessment   Currently in Pain? No/denies                Social History   Tobacco Use  Smoking Status Former   Current packs/day: 0.00   Average packs/day: 1 pack/day for 30.0 years (30.0 ttl pk-yrs)   Types: Cigarettes   Start date: 12/19/1971   Quit date: 12/18/2001   Years since quitting: 21.8  Smokeless Tobacco Never    Goals Met:  Proper associated with RPD/PD & O2 Sat Independence with exercise equipment Exercise tolerated well No report of concerns or symptoms today  Goals Unmet:  Not Applicable  Comments: Pt able to follow exercise prescription today without complaint.  Will continue to monitor for progression.    Dr. Bethann Punches is Medical Director for [facility] Cardiac Rehabilitation.  Dr. Vida Rigger is Medical Director for [facility] Pulmonary [facility].

## 2023-11-07 ENCOUNTER — Encounter: Payer: Self-pay | Admitting: *Deleted

## 2023-11-07 DIAGNOSIS — R0602 Shortness of breath: Secondary | ICD-10-CM

## 2023-11-07 DIAGNOSIS — J449 Chronic obstructive pulmonary disease, unspecified: Secondary | ICD-10-CM

## 2023-11-07 NOTE — Progress Notes (Signed)
Pulmonary Individual Treatment Plan  Patient Details  Name: Darius Norris MRN: [id number] Date of Birth: [birth year] Referring Provider:   Flowsheet Row Pulmonary Rehab from 10/10/2023 in [facility] Cardiac and Pulmonary Rehab  Referring Provider Dr. Mliss Sax       Initial Encounter Date:  Flowsheet Row Pulmonary Rehab from 10/10/2023 in [facility] Cardiac and Pulmonary Rehab  Date 10/10/23       Visit Diagnosis: Shortness of breath  Stage 3 severe COPD by GOLD classification (HCC)  Patient's Home Medications on Admission:  Current Outpatient Medications:    acetaminophen (TYLENOL) 325 MG tablet, Take 975 mg by mouth 3 (three) times daily., Disp: , Rfl:    albuterol (VENTOLIN HFA) 108 (90 Base) MCG/ACT inhaler, Inhale into the lungs., Disp: , Rfl:    aspirin EC 81 MG EC tablet, Take 1 tablet (81 mg total) by mouth daily., Disp: 180 tablet, Rfl: 0   atorvastatin (LIPITOR) 80 MG tablet, Take 0.5 tablets (40 mg total) by mouth at bedtime. Half of an 80, Disp: , Rfl:    busPIRone (BUSPAR) 10 MG tablet, Take 10 mg by mouth 2 (two) times daily., Disp: , Rfl:    clobetasol (TEMOVATE) 0.05 % external solution, Apply topically. (Patient not taking: Reported on 10/04/2023), Disp: , Rfl:    dabigatran (PRADAXA) 150 MG CAPS capsule, Take 150 mg by mouth 2 (two) times daily., Disp: , Rfl:    diltiazem (CARDIZEM CD) 300 MG 24 hr capsule, Take 1 capsule (300 mg total) by mouth daily., Disp: 30 capsule, Rfl: 0   empagliflozin (JARDIANCE) 25 MG TABS tablet, Take 25 mg by mouth daily. Take 0.5 tablet (12.5 mg) by mouth once daily, Disp: , Rfl:    folic acid (FOLVITE) 1 MG tablet, Take 2 mg by mouth daily. Hold on Sunday when taking Methotrexate, Disp: , Rfl:    furosemide (LASIX) 40 MG tablet, Take 1 tablet (40 mg total) by mouth daily. (Patient taking differently: Take 40 mg by mouth daily. Take only if weight has increased by 3 lbs), Disp: 30 tablet, Rfl: 2   gabapentin (NEURONTIN) 300 MG capsule,  Take 600 mg by mouth 3 (three) times daily., Disp: , Rfl:    hydrocortisone 2.5 % cream, Apply topically., Disp: , Rfl:    isosorbide mononitrate (IMDUR) 60 MG 24 hr tablet, Take 60 mg by mouth daily., Disp: , Rfl:    lidocaine (LIDODERM) 5 %, Place 1 patch onto the skin daily. Remove & Discard patch within 12 hours or as directed by MD (Patient not taking: Reported on 10/04/2023), Disp: , Rfl:    loratadine (CLARITIN) 10 MG tablet, Take 10 mg by mouth daily as needed for allergies. Take 0.5 tablet (5 mg) by mouth once daily as needed for allergies, Disp: , Rfl:    losartan (COZAAR) 25 MG tablet, Take 25 mg by mouth daily., Disp: , Rfl:    magnesium oxide (MAG-OX) 400 MG tablet, Take 800 mg by mouth daily., Disp: , Rfl:    Melatonin 3 MG CAPS, Take 2 capsules by mouth at bedtime., Disp: , Rfl:    metFORMIN (GLUCOPHAGE) 500 MG tablet, Take 500 mg by mouth 2 (two) times daily with a meal., Disp: , Rfl:    methotrexate (RHEUMATREX) 2.5 MG tablet, Take 12.5 mg by mouth once a week. Take 12.5 mg by mouth once weekly (on Sundays), Disp: , Rfl:    metoprolol tartrate (LOPRESSOR) 100 MG tablet, Take 1 tablet (100 mg total) by mouth 2 (two) times  daily., Disp: 60 tablet, Rfl: 2   Mometasone Furoate 100 MCG/ACT AERO, Inhale 2 puffs into the lungs 2 (two) times daily., Disp: , Rfl:    omeprazole (PRILOSEC) 20 MG capsule, Take 20 mg by mouth daily., Disp: , Rfl:    oxymetazoline (AFRIN) 0.05 % nasal spray, Place 1 spray into both nostrils daily., Disp: , Rfl:    potassium chloride SA (KLOR-CON M) 20 MEQ tablet, Take 20 mEq by mouth daily as needed (With Furosemide)., Disp: , Rfl:    senna (SENOKOT) 8.6 MG TABS tablet, Take 1 tablet by mouth daily as needed for mild constipation., Disp: , Rfl:    simethicone (MYLICON) 80 MG chewable tablet, Chew 80 mg by mouth 4 (four) times daily as needed for flatulence., Disp: , Rfl:    Tiotropium Bromide-Olodaterol (STIOLTO RESPIMAT) 2.5-2.5 MCG/ACT AERS, Inhale 2 each into  the lungs 2 (two) times daily., Disp: , Rfl:    traMADol (ULTRAM) 50 MG tablet, Take 1 tablet (50 mg total) by mouth every 6 (six) hours as needed. (Patient not taking: Reported on 08/24/2023), Disp: 10 tablet, Rfl: 0  Past Medical History: Past Medical History:  Diagnosis Date   A-fib (HCC)    CAD (coronary artery disease)    Colitis    COPD (chronic obstructive pulmonary disease) (HCC)    Diabetes mellitus without complication (HCC)    Emphysema lung (HCC)    GERD (gastroesophageal reflux disease)    Hypertension    MI (myocardial infarction) (HCC) 1999    Tobacco Use: Social History   Tobacco Use  Smoking Status Former   Current packs/day: 0.00   Average packs/day: 1 pack/day for 30.0 years (30.0 ttl pk-yrs)   Types: Cigarettes   Start date: 12/19/1971   Quit date: 12/18/2001   Years since quitting: 21.9  Smokeless Tobacco Never    Labs: Review Flowsheet  More data exists      Latest Ref Rng & Units 05/02/2018 01/05/2019 12/06/2021 01/02/2023 01/03/2023  Labs for ITP Cardiac and Pulmonary Rehab  Cholestrol 0 - 200 mg/dL - - 237  - 98   LDL (calc) 0 - 99 mg/dL - - 51  - 53   HDL-C >62 mg/dL - - 33  - 29   Trlycerides <150 mg/dL - - 831  - 81   Hemoglobin A1c 4.8 - 5.6 % - - 5.9  6.0  -  Bicarbonate 20.0 - 28.0 mmol/L 36.3  29.6  - - -  O2 Saturation % - 81.7  - - -    Details             Pulmonary Assessment Scores:  Pulmonary Assessment Scores     Row Name 10/10/23 1552         ADL UCSD   ADL Phase Entry     SOB Score total 58     Rest 2     Walk 3     Stairs 5     Bath 4     Dress 2     Shop 3       CAT Score   CAT Score 24       mMRC Score   mMRC Score 1              UCSD: Self-administered rating of dyspnea associated with activities of daily living (ADLs) 6-point scale (0 = "not at all" to 5 = "maximal or unable to do because of breathlessness")  Scoring Scores range from 0 to 120.  Minimally important difference is 5 units  CAT: CAT  can identify the health impairment of COPD patients and is better correlated with disease progression.  CAT has a scoring range of zero to 40. The CAT score is classified into four groups of low (less than 10), medium (10 - 20), high (21-30) and very high (31-40) based on the impact level of disease on health status. A CAT score over 10 suggests significant symptoms.  A worsening CAT score could be explained by an exacerbation, poor medication adherence, poor inhaler technique, or progression of COPD or comorbid conditions.  CAT MCID is 2 points  mMRC: mMRC (Modified Medical Research Council) Dyspnea Scale is used to assess the degree of baseline functional disability in patients of respiratory disease due to dyspnea. No minimal important difference is established. A decrease in score of 1 point or greater is considered a positive change.   Pulmonary Function Assessment:   Exercise Target Goals: Exercise Program Goal: Individual exercise prescription set using results from initial 6 min walk test and THRR while considering  patient's activity barriers and safety.   Exercise Prescription Goal: Initial exercise prescription builds to 30-45 minutes a day of aerobic activity, 2-3 days per week.  Home exercise guidelines will be given to patient during program as part of exercise prescription that the participant will acknowledge.  Education: Aerobic Exercise: - Group verbal and visual presentation on the components of exercise prescription. Introduces F.I.T.T principle from ACSM for exercise prescriptions.  Reviews F.I.T.T. principles of aerobic exercise including progression. Written material given at graduation. Flowsheet Row Pulmonary Rehab from 11/03/2021 in [facility] Cardiac and Pulmonary Rehab  Date 10/13/21  Educator [facility]  Instruction Review Code 1- Verbalizes Understanding       Education: Resistance Exercise: - Group verbal and visual presentation on the components of exercise  prescription. Introduces F.I.T.T principle from ACSM for exercise prescriptions  Reviews F.I.T.T. principles of resistance exercise including progression. Written material given at graduation.    Education: Exercise & Equipment Safety: - Individual verbal instruction and demonstration of equipment use and safety with use of the equipment. Flowsheet Row Pulmonary Rehab from 10/25/2023 in [facility] Cardiac and Pulmonary Rehab  Date 10/10/23  Educator [facility]  Instruction Review Code 1- Verbalizes Understanding       Education: Exercise Physiology & General Exercise Guidelines: - Group verbal and written instruction with models to review the exercise physiology of the cardiovascular system and associated critical values. Provides general exercise guidelines with specific guidelines to those with heart or lung disease.  Flowsheet Row Pulmonary Rehab from 10/25/2023 in [facility] Cardiac and Pulmonary Rehab  Education need identified 10/10/23       Education: Flexibility, Balance, Mind/Body Relaxation: - Group verbal and visual presentation with interactive activity on the components of exercise prescription. Introduces F.I.T.T principle from ACSM for exercise prescriptions. Reviews F.I.T.T. principles of flexibility and balance exercise training including progression. Also discusses the mind body connection.  Reviews various relaxation techniques to help reduce and manage stress (i.e. Deep breathing, progressive muscle relaxation, and visualization). Balance handout provided to take home. Written material given at graduation. Flowsheet Row Pulmonary Rehab from 11/03/2021 in [facility] Cardiac and Pulmonary Rehab  Date 10/27/21  Educator [facility]  Instruction Review Code 1- Verbalizes Understanding       Activity Barriers & Risk Stratification:  Activity Barriers & Cardiac Risk Stratification - 10/10/23 1555       Activity Barriers & Cardiac Risk Stratification   Activity Barriers Joint Problems;Balance  Concerns;Deconditioning;Shortness of  Breath;Muscular Weakness    Cardiac Risk Stratification Low             6 Minute Walk:  6 Minute Walk     Row Name 10/10/23 1552         6 Minute Walk   Phase Initial     Distance 615 feet     Walk Time 5.54 minutes     # of Rest Breaks 1     MPH 1.2     METS 1.1     RPE 13     Perceived Dyspnea  1     VO2 Peak 3.76     Symptoms No     Resting HR 58 bpm     Resting BP 106/52     Resting Oxygen Saturation  96 %     Exercise Oxygen Saturation  during 6 min walk 88 %     Max Ex. HR 68 bpm     Max Ex. BP 126/60     2 Minute Post BP 108/48       Interval HR   1 Minute HR 64     2 Minute HR 66     3 Minute HR 67     4 Minute HR 68     5 Minute HR 68     6 Minute HR 68     2 Minute Post HR 58     Interval Heart Rate? Yes       Interval Oxygen   Interval Oxygen? Yes     Baseline Oxygen Saturation % 96 %     1 Minute Oxygen Saturation % 92 %     1 Minute Liters of Oxygen 4 L     2 Minute Oxygen Saturation % 89 %     2 Minute Liters of Oxygen 4 L     3 Minute Oxygen Saturation % 91 %     3 Minute Liters of Oxygen 4 L     4 Minute Oxygen Saturation % 89 %     4 Minute Liters of Oxygen 4 L     5 Minute Oxygen Saturation % 88 %     5 Minute Liters of Oxygen 4 L     6 Minute Oxygen Saturation % 91 %     6 Minute Liters of Oxygen 4 L     2 Minute Post Oxygen Saturation % 97 %     2 Minute Post Liters of Oxygen 3 L             Oxygen Initial Assessment:  Oxygen Initial Assessment - 10/04/23 1110       Home Oxygen   Home Oxygen Device Home Concentrator;E-Tanks    Sleep Oxygen Prescription Continuous    Liters per minute 3    Home Exercise Oxygen Prescription Continuous    Liters per minute 3    Home Resting Oxygen Prescription Continuous    Liters per minute 3    Compliance with Home Oxygen Use Yes      Intervention   Short Term Goals To learn and exhibit compliance with exercise, home and travel O2 prescription;To  learn and understand importance of monitoring SPO2 with pulse oximeter and demonstrate accurate use of the pulse oximeter.;To learn and understand importance of maintaining oxygen saturations>88%;To learn and demonstrate proper pursed lip breathing techniques or other breathing techniques. ;To learn and demonstrate proper use of respiratory medications    Long  Term Goals Exhibits compliance with  exercise, home  and travel O2 prescription;Verbalizes importance of monitoring SPO2 with pulse oximeter and return demonstration;Maintenance of O2 saturations>88%;Exhibits proper breathing techniques, such as pursed lip breathing or other method taught during program session;Compliance with respiratory medication;Demonstrates proper use of MDI's             Oxygen Re-Evaluation:  Oxygen Re-Evaluation     Row Name 10/16/23 1128 10/18/23 1110           Program Oxygen Prescription   Program Oxygen Prescription Continuous;E-Tanks Continuous;E-Tanks      Liters per minute 3 3        Home Oxygen   Home Oxygen Device Home Concentrator;E-Tanks Home Concentrator;E-Tanks      Sleep Oxygen Prescription Continuous Continuous      Liters per minute 3 3      Home Exercise Oxygen Prescription Continuous Continuous      Liters per minute 3 3      Home Resting Oxygen Prescription Continuous Continuous      Liters per minute 3 3      Compliance with Home Oxygen Use Yes Yes        Goals/Expected Outcomes   Short Term Goals To learn and demonstrate proper pursed lip breathing techniques or other breathing techniques.  To learn and demonstrate proper pursed lip breathing techniques or other breathing techniques.       Long  Term Goals Exhibits proper breathing techniques, such as pursed lip breathing or other method taught during program session Exhibits proper breathing techniques, such as pursed lip breathing or other method taught during program session      Comments Reviewed PLB technique with pt.  Talked  about how it works and it's importance in maintaining their exercise saturations. Informed patient how to perform the Pursed Lipped breathing technique. Told patient to Inhale through the nose and out the mouth with pursed lips to keep their airways open, help oxygenate them better, practice when at rest or doing strenuous activity. Patient Verbalizes understanding of technique and will work on and be reiterated during [name].      Goals/Expected Outcomes Short: Become more profiecient at using PLB. Long: Become independent at using PLB. Short: use PLB with exertion. Long: use PLB on exertion proficiently and independently.               Oxygen Discharge (Final Oxygen Re-Evaluation):  Oxygen Re-Evaluation - 10/18/23 1110       Program Oxygen Prescription   Program Oxygen Prescription Continuous;E-Tanks    Liters per minute 3      Home Oxygen   Home Oxygen Device Home Concentrator;E-Tanks    Sleep Oxygen Prescription Continuous    Liters per minute 3    Home Exercise Oxygen Prescription Continuous    Liters per minute 3    Home Resting Oxygen Prescription Continuous    Liters per minute 3    Compliance with Home Oxygen Use Yes      Goals/Expected Outcomes   Short Term Goals To learn and demonstrate proper pursed lip breathing techniques or other breathing techniques.     Long  Term Goals Exhibits proper breathing techniques, such as pursed lip breathing or other method taught during program session    Comments Informed patient how to perform the Pursed Lipped breathing technique. Told patient to Inhale through the nose and out the mouth with pursed lips to keep their airways open, help oxygenate them better, practice when at rest or doing strenuous activity. Patient Verbalizes understanding of  technique and will work on and be reiterated during [name].    Goals/Expected Outcomes Short: use PLB with exertion. Long: use PLB on exertion proficiently and independently.              Initial Exercise Prescription:  Initial Exercise Prescription - 10/10/23 1500       Date of Initial Exercise RX and Referring Provider   Date 10/10/23    Referring Provider Dr. Mliss Sax      Oxygen   Oxygen Continuous    Liters 3L    Maintain Oxygen Saturation 88% or higher      Treadmill   MPH 1.2    Grade 0    Minutes 15    METs 1.1      NuStep   Level 1    SPM 80    Minutes 15    METs 1.1      T5 Nustep   Level 1    SPM 80    Minutes 15    METs 1.1      Prescription Details   Duration Progress to 30 minutes of continuous aerobic without signs/symptoms of physical distress      Intensity   THRR 40-80% of Max Heartrate 94-130    Ratings of Perceived Exertion 11-13    Perceived Dyspnea 0-4      Progression   Progression Continue to progress workloads to maintain intensity without signs/symptoms of physical distress.      Resistance Training   Training Prescription Yes    Weight 3 lb    Reps 10-15             Perform Capillary Blood Glucose checks as needed.  Exercise Prescription Changes:   Exercise Prescription Changes     Row Name 10/10/23 1500             Response to Exercise   Blood Pressure (Admit) 106/52       Blood Pressure (Exercise) 126/60       Blood Pressure (Exit) 108/48       Heart Rate (Admit) 58 bpm       Heart Rate (Exercise) 68 bpm       Heart Rate (Exit) 58 bpm       Oxygen Saturation (Admit) 96 %       Oxygen Saturation (Exercise) 88 %       Oxygen Saturation (Exit) 97 %       Rating of Perceived Exertion (Exercise) 13       Perceived Dyspnea (Exercise) 1       Symptoms none       Comments results       Duration Progress to 30 minutes of  aerobic without signs/symptoms of physical distress       Intensity THRR New         Progression   Progression Continue to progress workloads to maintain intensity without signs/symptoms of physical distress.       Average METs 1.1                Exercise  Comments:   Exercise Comments     Row Name 10/16/23 1126           Exercise Comments First full day of exercise!  Patient was oriented to gym and equipment including functions, settings, policies, and procedures.  Patient's individual exercise prescription and treatment plan were reviewed.  All starting workloads were established based on the results of the 6 minute walk  test done at initial orientation visit.  The plan for exercise progression was also introduced and progression will be customized based on patient's performance and goals.                Exercise Goals and Review:   Exercise Goals     Row Name 10/10/23 1600             Exercise Goals   Increase Physical Activity Yes       Intervention Provide advice, education, support and counseling about physical activity/exercise needs.;Develop an individualized exercise prescription for aerobic and resistive training based on initial evaluation findings, risk stratification, comorbidities and participant's personal goals.       Expected Outcomes Short Term: Attend rehab on a regular basis to increase amount of physical activity.;Long Term: Add in home exercise to make exercise part of routine and to increase amount of physical activity.;Long Term: Exercising regularly at least 3-5 days a week.       Increase Strength and Stamina Yes       Intervention Provide advice, education, support and counseling about physical activity/exercise needs.;Develop an individualized exercise prescription for aerobic and resistive training based on initial evaluation findings, risk stratification, comorbidities and participant's personal goals.       Expected Outcomes Short Term: Increase workloads from initial exercise prescription for resistance, speed, and METs.;Short Term: Perform resistance training exercises routinely during rehab and add in resistance training at home;Long Term: Improve cardiorespiratory fitness, muscular endurance and  strength as measured by increased METs and functional capacity ( )       Able to understand and use rate of perceived exertion (RPE) scale Yes       Intervention Provide education and explanation on how to use RPE scale       Expected Outcomes Short Term: Able to use RPE daily in rehab to express subjective intensity level;Long Term:  Able to use RPE to guide intensity level when exercising independently       Able to understand and use Dyspnea scale Yes       Intervention Provide education and explanation on how to use Dyspnea scale       Expected Outcomes Short Term: Able to use Dyspnea scale daily in rehab to express subjective sense of shortness of breath during exertion;Long Term: Able to use Dyspnea scale to guide intensity level when exercising independently       Knowledge and understanding of Target Heart Rate Range (THRR) Yes       Intervention Provide education and explanation of THRR including how the numbers were predicted and where they are located for reference       Expected Outcomes Short Term: Able to state/look up THRR;Short Term: Able to use daily as guideline for intensity in rehab;Long Term: Able to use THRR to govern intensity when exercising independently       Able to check pulse independently Yes       Intervention Provide education and demonstration on how to check pulse in carotid and radial arteries.;Review the importance of being able to check your own pulse for safety during independent exercise       Expected Outcomes Short Term: Able to explain why pulse checking is important during independent exercise;Long Term: Able to check pulse independently and accurately       Understanding of Exercise Prescription Yes       Intervention Provide education, explanation, and written materials on patient's individual exercise prescription  Expected Outcomes Short Term: Able to explain program exercise prescription;Long Term: Able to explain home exercise prescription to  exercise independently                Exercise Goals Re-Evaluation :  Exercise Goals Re-Evaluation     Row Name 10/16/23 1127             Exercise Goal Re-Evaluation   Exercise Goals Review Able to understand and use rate of perceived exertion (RPE) scale;Knowledge and understanding of Target Heart Rate Range (THRR);Able to understand and use Dyspnea scale;Understanding of Exercise Prescription       Comments Reviewed RPE and dyspnea scale, THR and program prescription with pt today.  Pt voiced understanding and was given a copy of goals to take home.       Expected Outcomes Short: Use RPE daily to regulate intensity. Long: Follow program prescription in THR.                Discharge Exercise Prescription (Final Exercise Prescription Changes):  Exercise Prescription Changes - 10/10/23 1500       Response to Exercise   Blood Pressure (Admit) 106/52    Blood Pressure (Exercise) 126/60    Blood Pressure (Exit) 108/48    Heart Rate (Admit) 58 bpm    Heart Rate (Exercise) 68 bpm    Heart Rate (Exit) 58 bpm    Oxygen Saturation (Admit) 96 %    Oxygen Saturation (Exercise) 88 %    Oxygen Saturation (Exit) 97 %    Rating of Perceived Exertion (Exercise) 13    Perceived Dyspnea (Exercise) 1    Symptoms none    Comments results    Duration Progress to 30 minutes of  aerobic without signs/symptoms of physical distress    Intensity THRR New      Progression   Progression Continue to progress workloads to maintain intensity without signs/symptoms of physical distress.    Average METs 1.1             Nutrition:  Target Goals: Understanding of nutrition guidelines, daily intake of sodium 1500mg , cholesterol 200mg , calories 30% from fat and 7% or less from saturated fats, daily to have 5 or more servings of fruits and vegetables.  Education: All About Nutrition: -Group instruction provided by verbal, written material, interactive activities, discussions, models,  and posters to present general guidelines for heart healthy nutrition including fat, fiber, MyPlate, the role of sodium in heart healthy nutrition, utilization of the nutrition label, and utilization of this knowledge for meal planning. Follow up email sent as well. Written material given at graduation. Flowsheet Row Pulmonary Rehab from 10/25/2023 in [facility] Cardiac and Pulmonary Rehab  Date 10/25/23  Educator JG  Instruction Review Code 1- Verbalizes Understanding       Biometrics:  Pre Biometrics - 10/10/23 1600       Pre Biometrics   Height 5' 5.6" (1.666 m)    Weight 185 lb 11.2 oz (84.2 kg)    Waist Circumference 43 inches    Hip Circumference 44.5 inches    Waist to Hip Ratio 0.97 %    BMI (Calculated) 30.35    Single Leg Stand 0 seconds              Nutrition Therapy Plan and Nutrition Goals:   Nutrition Assessments:  MEDIFICTS Score Key: >=70 Need to make dietary changes  40-70 Heart Healthy Diet <= 40 Therapeutic Level Cholesterol Diet  Flowsheet Row Pulmonary Rehab from 10/10/2023 in  [facility] Cardiac [facility] Re[facility]  Picture Your Plate Total Score on Admission 58      Picture Your Plate Scores: <54 Unhealthy dietary pattern with much room for improvement. 41-50 Dietary pattern unlikely to meet recommendations for good health and room for improvement. 51-60 More healthful dietary pattern, with some room for improvement.  >60 Healthy dietary pattern, although there may be some specific behaviors that could be improved.   Nutrition Goals Re-Evaluation:  Nutrition Goals Re-Evaluation     Row Name 10/18/23 1112             Goals   Current Weight 183 lb (83 kg)       Comment Patient was informed on why it is important to maintain a balanced diet when dealing with Respiratory issues. Explained that it takes a lot of energy to breath and when they are short of breath often they will need to have a good diet to help keep up with the calories they are  expending for breathing.       Expected Outcome Short: Choose and plan snacks accordingly to patients caloric intake to improve breathing. Long: Maintain a diet independently that meets their caloric intake to aid in daily shortness of breath.                Nutrition Goals Discharge (Final Nutrition Goals Re-Evaluation):  Nutrition Goals Re-Evaluation - 10/18/23 1112       Goals   Current Weight 183 lb (83 kg)    Comment Patient was informed on why it is important to maintain a balanced diet when dealing with Respiratory issues. Explained that it takes a lot of energy to breath and when they are short of breath often they will need to have a good diet to help keep up with the calories they are expending for breathing.    Expected Outcome Short: Choose and plan snacks accordingly to patients caloric intake to improve breathing. Long: Maintain a diet independently that meets their caloric intake to aid in daily shortness of breath.             Psychosocial: Target Goals: Acknowledge presence or absence of significant depression and/or stress, maximize coping skills, provide positive support system. Participant is able to verbalize types and ability to use techniques and skills needed for reducing stress and depression.   Education: Stress, Anxiety, and Depression - Group verbal and visual presentation to define topics covered.  Reviews how body is impacted by stress, anxiety, and depression.  Also discusses healthy ways to reduce stress and to treat/manage anxiety and depression.  Written material given at graduation. Flowsheet Row Pulmonary Rehab from 11/03/2021 in [facility] Cardiac and Pulmonary Rehab  Date 07/28/21  Educator [facility]  Instruction Review Code 1- [employer] Understanding       Education: Sleep Hygiene -Provides group verbal and written instruction about how sleep can affect your health.  Define sleep hygiene, discuss sleep cycles and impact of sleep habits. Review good  sleep hygiene tips.    Initial Review & Psychosocial Screening:  Initial Psych Review & Screening - 10/04/23 1111       Initial Review   Current issues with History of Depression;Current Psychotropic Meds;Current Sleep Concerns      Family Dynamics   Good Support System? Yes   wife, older sister   Concerns Recent loss of significant other   youngest sister passed away 6 months a go,  nephew died 3 months ago.     Barriers  Psychosocial barriers to participate in program There are no identifiable barriers or psychosocial needs.      Screening Interventions   Interventions Encouraged to exercise;To provide support and resources with identified psychosocial needs;Provide feedback about the scores to participant    Expected Outcomes Short Term goal: Utilizing psychosocial counselor, staff and physician to assist with identification of specific Stressors or current issues interfering with healing process. Setting desired goal for each stressor or current issue identified.;Long Term Goal: Stressors or current issues are controlled or eliminated.;Short Term goal: Identification and review with participant of any Quality of Life or Depression concerns found by scoring the questionnaire.;Long Term goal: The participant improves quality of Life and PHQ9 Scores as seen by post scores and/or verbalization of changes             Quality of Life Scores:  Scores of 19 and below usually indicate a poorer quality of life in these areas.  A difference of  2-3 points is a clinically meaningful difference.  A difference of 2-3 points in the total score of the Quality of Life Index has been associated with significant improvement in overall quality of life, self-image, physical symptoms, and general health in studies assessing change in quality of life.  PHQ-9: Review Flowsheet  More data exists      10/10/2023 11/29/2022 07/18/2022 06/19/2022 11/17/2021  Depression screen PHQ 2/9  Decreased Interest 1 1  1 2 1   Down, Depressed, Hopeless 0 0 0 1 0  PHQ - 2 Score 1 1 1 3 1   Altered sleeping 2 3 3 2 2   Tired, decreased energy 1 2 2 2 1   Change in appetite 0 0 0 0 1  Feeling bad or failure about yourself  0 1 0 0 0  Trouble concentrating 0 0 0 0 0  Moving slowly or fidgety/restless 0 0 0 0 0  Suicidal thoughts 0 0 0 0 0  PHQ-9 Score 4 7 6 7 5   Difficult doing work/chores Somewhat difficult Somewhat difficult Somewhat difficult Not difficult at all Somewhat difficult    Details           Interpretation of Total Score  Total Score Depression Severity:  1-4 = Minimal depression, 5-9 = Mild depression, 10-14 = Moderate depression, 15-19 = Moderately severe depression, 20-27 = Severe depression   Psychosocial Evaluation and Intervention:  Psychosocial Evaluation - 10/04/23 1131       Psychosocial Evaluation & Interventions   Interventions Encouraged to exercise with the program and follow exercise prescription    Comments MIke has no barriers to attending the program. He has a history of depression and is taking meds that have his symptoms well controlled.  He is having trouble sleeping. He has to wake up to relieve the stuffiness  from the use of oxygen in his nose. Advised him to talk to his doctor about some humidity at night for his oxygen or look for saline gel for his nostrils to help keep them moist.  . He stated he had some gel and will try it again.  MIke had 2 family members pass away in the last year. One was his younger sister and the other a nephew.   He stated he was doing okay fter their deaths.  He lives with his wife and she and his older sister are his support people.  His wife continues to workand he  experessed he thought it may be hard for her.  He is ready to return to  the program.             Psychosocial Re-Evaluation:  Psychosocial Re-Evaluation     Row Name 10/18/23 1113             Psychosocial Re-Evaluation   Current issues with None Identified        Comments Patient reports no issues with their current mental states, sleep, stress, depression or anxiety. Will follow up with patient in a few weeks for any changes.       Expected Outcomes Short: Continue to exercise regularly to support mental health and notify staff of any changes. Long: maintain mental health and well being through teaching of rehab or prescribed medications independently.       Interventions Encouraged to attend Pulmonary Rehabilitation for the exercise       Continue Psychosocial Services  Follow up required by staff                Psychosocial Discharge (Final Psychosocial Re-Evaluation):  Psychosocial Re-Evaluation - 10/18/23 1113       Psychosocial Re-Evaluation   Current issues with None Identified    Comments Patient reports no issues with their current mental states, sleep, stress, depression or anxiety. Will follow up with patient in a few weeks for any changes.    Expected Outcomes Short: Continue to exercise regularly to support mental health and notify staff of any changes. Long: maintain mental health and well being through teaching of rehab or prescribed medications independently.    Interventions Encouraged to attend Pulmonary Rehabilitation for the exercise    Continue Psychosocial Services  Follow up required by staff             Education: Education Goals: Education classes will be provided on a weekly basis, covering required topics. Participant will state understanding/return demonstration of topics presented.  Learning Barriers/Preferences:   General Pulmonary Education Topics:  Infection Prevention: - Provides verbal and written material to individual with discussion of infection control including proper hand washing and proper equipment cleaning during exercise session. Flowsheet Row Pulmonary Rehab from 10/25/2023 in [facility] Cardiac and Pulmonary Rehab  Date 10/10/23  Educator [facility]  Instruction Review Code 1- Verbalizes Understanding        Falls Prevention: - Provides verbal and written material to individual with discussion of falls prevention and safety. Flowsheet Row Pulmonary Rehab from 10/25/2023 in [facility] Cardiac and Pulmonary Rehab  Date 10/10/23  Educator [facility]  Instruction Review Code 1- Verbalizes Understanding       Chronic Lung Disease Review: - Group verbal instruction with posters, models, PowerPoint presentations and videos,  to review new updates, new respiratory medications, new advancements in procedures and treatments. Providing information on websites and "800" numbers for continued self-education. Includes information about supplement oxygen, available portable oxygen systems, continuous and intermittent flow rates, oxygen safety, concentrators, and Medicare reimbursement for oxygen. Explanation of Pulmonary Drugs, including class, frequency, complications, importance of spacers, rinsing mouth after steroid MDI's, and proper cleaning methods for nebulizers. Review of basic lung anatomy and physiology related to function, structure, and complications of lung disease. Review of risk factors. Discussion about methods for diagnosing sleep apnea and types of masks and machines for OSA. Includes a review of the use of types of environmental controls: home humidity, furnaces, filters, dust mite/pet prevention, HEPA vacuums. Discussion about weather changes, air quality and the benefits of nasal washing. Instruction on Warning signs, infection symptoms, calling MD promptly, preventive modes, and value of vaccinations. Review of effective airway  clearance, coughing and/or vibration techniques. Emphasizing that all should Create an Action Plan. Written material given at graduation. Flowsheet Row Pulmonary Rehab from 10/25/2023 in [facility] Cardiac and Pulmonary Rehab  Education need identified 10/10/23       AED/CPR: - Group verbal and written instruction with the use of models to demonstrate the basic use of the AED with  the basic ABC's of resuscitation. Flowsheet Row Pulmonary Rehab from 02/26/2019 in [facility] Cardiac and Pulmonary Rehab  Date 01/01/19  Educator [facility]  Instruction Review Code 1- Verbalizes Understanding        Anatomy and Cardiac Procedures: - Group verbal and visual presentation and models provide information about basic cardiac anatomy and function. Reviews the testing methods done to diagnose heart disease and the outcomes of the test results. Describes the treatment choices: Medical Management, Angioplasty, or Coronary Bypass Surgery for treating various heart conditions including Myocardial Infarction, Angina, Valve Disease, and Cardiac Arrhythmias.  Written material given at graduation.   Medication Safety: - Group verbal and visual instruction to review commonly prescribed medications for heart and lung disease. Reviews the medication, class of the drug, and side effects. Includes the steps to properly store meds and maintain the prescription regimen.  Written material given at graduation. Flowsheet Row Pulmonary Rehab from 11/03/2021 in [facility] Cardiac and Pulmonary Rehab  Date 09/01/21  Educator [facility]  Instruction Review Code 1- Verbalizes Understanding       Other: -Provides group and verbal instruction on various topics (see comments)   Knowledge Questionnaire Score:  Knowledge Questionnaire Score - 10/10/23 1555       Knowledge Questionnaire Score   Pre Score 15/18              Core Components/Risk Factors/Patient Goals at Admission:  Personal Goals and Risk Factors at Admission - 10/04/23 1120       Core Components/Risk Factors/Patient Goals on Admission    Weight Management Yes    Intervention Weight Management: Develop a combined nutrition and exercise program designed to reach desired caloric intake, while maintaining appropriate intake of nutrient and fiber, sodium and fats, and appropriate energy expenditure required for the weight goal.;Weight Management: Provide  education and appropriate resources to help participant work on and attain dietary goals.    Admit Weight 179 lb (81.2 kg)    Goal Weight: Long Term 150 lb (68 kg)    Expected Outcomes Short Term: Continue to assess and modify interventions until short term weight is achieved;Long Term: Adherence to nutrition and physical activity/exercise program aimed toward attainment of established weight goal;Weight Loss: Understanding of general recommendations for a balanced deficit meal plan, which promotes 1-2 lb weight loss per week and includes a negative energy balance of [phone] kcal/d    Improve shortness of breath with ADL's Yes    Intervention Provide education, individualized exercise plan and daily activity instruction to help decrease symptoms of SOB with activities of daily living.    Expected Outcomes Short Term: Improve cardiorespiratory fitness to achieve a reduction of symptoms when performing ADLs;Long Term: Be able to perform more ADLs without symptoms or delay the onset of symptoms    Increase knowledge of respiratory medications and ability to use respiratory devices properly  Yes    Intervention Provide education and demonstration as needed of appropriate use of medications, inhalers, and oxygen therapy.    Expected Outcomes Short Term: Achieves understanding of medications use. Understands that oxygen is a medication prescribed by physician. Demonstrates appropriate use of inhaler and oxygen  therapy.;Long Term: Maintain appropriate use of medications, inhalers, and oxygen therapy.    Diabetes Yes    Intervention Provide education about signs/symptoms and action to take for hypo/hyperglycemia.;Provide education about proper nutrition, including hydration, and aerobic/resistive exercise prescription along with prescribed medications to achieve blood glucose in normal ranges: Fasting glucose 65-99 mg/dL    Expected Outcomes Short Term: Participant verbalizes understanding of the signs/symptoms  and immediate care of hyper/hypoglycemia, proper foot care and importance of medication, aerobic/resistive exercise and nutrition plan for blood glucose control.;Long Term: Attainment of HbA1C < 7%.    Hypertension Yes    Intervention Provide education on lifestyle modifcations including regular physical activity/exercise, weight management, moderate sodium restriction and increased consumption of fresh fruit, vegetables, and low fat dairy, alcohol moderation, and smoking cessation.;Monitor prescription use compliance.    Expected Outcomes Short Term: Continued assessment and intervention until BP is < 140/12mm HG in hypertensive participants. < 130/1mm HG in hypertensive participants with diabetes, heart failure or chronic kidney disease.;Long Term: Maintenance of blood pressure at goal levels.    Lipids Yes    Intervention Provide education and support for participant on nutrition & aerobic/resistive exercise along with prescribed medications to achieve LDL 70mg , HDL >40mg .    Expected Outcomes Short Term: Participant states understanding of desired cholesterol values and is compliant with medications prescribed. Participant is following exercise prescription and nutrition guidelines.;Long Term: Cholesterol controlled with medications as prescribed, with individualized exercise RX and with personalized nutrition plan. Value goals: LDL < 70mg , HDL > 40 mg.             Education:Diabetes - Individual verbal and written instruction to review signs/symptoms of diabetes, desired ranges of glucose level fasting, after meals and with exercise. Acknowledge that pre and post exercise glucose checks will be done for 3 sessions at entry of program. Flowsheet Row Pulmonary Rehab from 07/27/2022 in [facility] Cardiac and Pulmonary Rehab  Date 05/08/22  Educator [facility]  Instruction Review Code 1- Verbalizes Understanding       Know Your Numbers and Heart Failure: - Group verbal and visual instruction to discuss  disease risk factors for cardiac and pulmonary disease and treatment options.  Reviews associated critical values for Overweight/Obesity, Hypertension, Cholesterol, and Diabetes.  Discusses basics of heart failure: signs/symptoms and treatments.  Introduces Heart Failure Zone chart for action plan for heart failure.  Written material given at graduation. Flowsheet Row Pulmonary Rehab from 07/27/2022 in [facility] Cardiac and Pulmonary Rehab  Date 07/27/22  Educator [facility]  Instruction Review Code 1- Verbalizes Understanding       Core Components/Risk Factors/Patient Goals Review:   Goals and Risk Factor Review     Row Name 10/18/23 1111             Core Components/Risk Factors/Patient Goals Review   Personal Goals Review Improve shortness of breath with ADL's       Review Spoke to patient about their shortness of breath and what they can do to improve. Patient has been informed of breathing techniques when starting the program. Patient is informed to tell staff if they have had any med changes and that certain meds they are taking or not taking can be causing shortness of breath.       Expected Outcomes Short: Attend [name] regularly to improve shortness of breath with ADL's. Long: maintain independence with ADL's                Core Components/Risk Factors/Patient Goals at Discharge (Final Review):  Goals and Risk Factor Review - 10/18/23 1111       Core Components/Risk Factors/Patient Goals Review   Personal Goals Review Improve shortness of breath with ADL's    Review Spoke to patient about their shortness of breath and what they can do to improve. Patient has been informed of breathing techniques when starting the program. Patient is informed to tell staff if they have had any med changes and that certain meds they are taking or not taking can be causing shortness of breath.    Expected Outcomes Short: Attend [name] regularly to improve shortness of breath with ADL's. Long:  maintain independence with ADL's             ITP Comments:  ITP Comments     Row Name 10/04/23 1124 10/04/23 1138 10/10/23 1551 10/16/23 1126 10/17/23 0853   ITP Comments Virtual orientation call completed today. he has an appointment on Date: 10/10/2023  for EP eval and gym Orientation.  Documentation of diagnosis can be found in [facility]  CE Date: 07/24/2023   08/23/2023 . Virtual orientation call completed today. he has an appointment on Date: 10/10/2023  for EP eval and gym Orientation.  Documentation of diagnosis can be found in [facility]  CE Date: 07/24/2023   08/23/2023 .     Kathlene November was hospitalized for Covid in Sept. He is still gaining back his energy after discharge. Completed and gym orientation. Initial ITP created and sent for review to Dr. Jinny Sanders, Medical Director. First full day of exercise!  Patient was oriented to gym and equipment including functions, settings, policies, and procedures.  Patient's individual exercise prescription and treatment plan were reviewed.  All starting workloads were established based on the results of the 6 minute walk test done at initial orientation visit.  The plan for exercise progression was also introduced and progression will be customized based on patient's performance and goals. 30 Day review completed. Medical Director ITP review done, changes made as directed, and signed approval by Medical Director.    new to program    Row Name 11/07/23 0931           ITP Comments 30 Day review completed. Medical Director ITP review done, changes made as directed, and signed approval by Medical Director.                Comments:

## 2023-11-08 ENCOUNTER — Encounter: Payer: No Typology Code available for payment source | Admitting: *Deleted

## 2023-11-08 DIAGNOSIS — J449 Chronic obstructive pulmonary disease, unspecified: Secondary | ICD-10-CM

## 2023-11-08 DIAGNOSIS — R0602 Shortness of breath: Secondary | ICD-10-CM

## 2023-11-08 NOTE — Progress Notes (Signed)
Daily Session Note  Patient Details  Name: Darius Norris MRN: [id number] Date of Birth: [birth year] Referring Provider:   Flowsheet Row Pulmonary Rehab from 10/10/2023 in [facility] Cardiac and Pulmonary Rehab  Referring Provider Dr. Mliss Sax       Encounter Date: 11/08/2023  Check In:  Session Check In - 11/08/23 1111       Check-In   Supervising physician immediately available to respond to emergencies See telemetry face sheet for immediately available ER MD    Location [facility]-Cardiac & Pulmonary Rehab    Staff Present Ronette Deter, BS, Exercise Physiologist;Joseph Rockford, RCP,RRT,BSRT;Meredith [place], RN Atilano Median, RN, ADN    Virtual Visit No    Medication changes reported     No    Fall or balance concerns reported    No    Warm-up and Cool-down Performed on first and last piece of equipment    Resistance Training Performed Yes    VAD Patient? No    PAD/SET Patient? No      Pain Assessment   Currently in Pain? No/denies                Social History   Tobacco Use  Smoking Status Former   Current packs/day: 0.00   Average packs/day: 1 pack/day for 30.0 years (30.0 ttl pk-yrs)   Types: Cigarettes   Start date: 12/19/1971   Quit date: 12/18/2001   Years since quitting: 21.9  Smokeless Tobacco Never    Goals Met:  Independence with exercise equipment Exercise tolerated well No report of concerns or symptoms today Strength training completed today  Goals Unmet:  Not Applicable  Comments: Pt able to follow exercise prescription today without complaint.  Will continue to monitor for progression.    Dr. Bethann Punches is Medical Director for [facility] Cardiac Rehabilitation.  Dr. Vida Rigger is Medical Director for [facility] Pulmonary Rehabilitation.

## 2023-11-13 ENCOUNTER — Encounter: Payer: No Typology Code available for payment source | Admitting: *Deleted

## 2023-11-13 DIAGNOSIS — J449 Chronic obstructive pulmonary disease, unspecified: Secondary | ICD-10-CM

## 2023-11-13 DIAGNOSIS — R0602 Shortness of breath: Secondary | ICD-10-CM

## 2023-11-13 NOTE — Progress Notes (Signed)
Daily Session Note  Patient Details  Name: Darius Norris MRN: [id number] Date of Birth: Jul 22, 1951 Referring Provider:   Flowsheet Row Pulmonary Rehab from 10/10/2023 in [facility] Cardiac and Pulmonary Rehab  Referring Provider Dr. Mliss Sax       Encounter Date: 11/13/2023  Check In:  Session Check In - 11/13/23 1124       Check-In   Supervising physician immediately available to respond to emergencies See telemetry face sheet for immediately available ER MD    Location [facility]-Cardiac & Pulmonary Rehab    Staff Present Cora Collum, RN, BSN, CCRP;Jason Wallace Cullens, RDN, LDN;Meredith Jewel Baize, RN BSN;Maxon Conetta BS, , Exercise Physiologist    Virtual Visit No    Medication changes reported     No    Fall or balance concerns reported    No    Warm-up and Cool-down Performed on first and last piece of equipment    Resistance Training Performed Yes    VAD Patient? No    PAD/SET Patient? No      Pain Assessment   Currently in Pain? No/denies                Social History   Tobacco Use  Smoking Status Former   Current packs/day: 0.00   Average packs/day: 1 pack/day for 30.0 years (30.0 ttl pk-yrs)   Types: Cigarettes   Start date: 12/19/1971   Quit date: 12/18/2001   Years since quitting: 21.9  Smokeless Tobacco Never    Goals Met:  Proper associated with RPD/PD & O2 Sat Independence with exercise equipment Exercise tolerated well No report of concerns or symptoms today  Goals Unmet:  Not Applicable  Comments: Pt able to follow exercise prescription today without complaint.  Will continue to monitor for progression.    Dr. Bethann Punches is Medical Director for [facility] Cardiac Rehabilitation.  Dr. Vida Rigger is Medical Director for [facility] Pulmonary Rehabilitation.

## 2023-11-22 ENCOUNTER — Encounter: Payer: No Typology Code available for payment source | Attending: Internal Medicine | Admitting: *Deleted

## 2023-11-22 DIAGNOSIS — Z5189 Encounter for other specified aftercare: Secondary | ICD-10-CM | POA: Insufficient documentation

## 2023-11-22 DIAGNOSIS — R0602 Shortness of breath: Secondary | ICD-10-CM | POA: Insufficient documentation

## 2023-11-22 DIAGNOSIS — J449 Chronic obstructive pulmonary disease, unspecified: Secondary | ICD-10-CM | POA: Diagnosis present

## 2023-11-22 NOTE — Progress Notes (Signed)
Daily Session Note  Patient Details  Name: Darius Norris MRN: [id number] Date of Birth: July 01, 1951 Referring Provider:   Flowsheet Row Pulmonary Rehab from 10/10/2023 in [facility]  Referring Provider Dr. Mliss Sax       Encounter Date: 11/22/2023  Check In:  Session Check In - 11/22/23 1117       Check-In   Supervising physician immediately available to respond to emergencies See telemetry face sheet for immediately available ER MD    Location [facility] & Pul[facility]    Staff Present Susann Givens, RN BSN;Joseph Leakey, RCP,RRT,BSRT;Noah [place], [place], Exercise Physiologist    Virtual Visit No    Medication changes reported     No    Fall or balance concerns reported    No    Warm-up and Cool-down Performed on first and last piece of equipment    Resistance Training Performed Yes    VAD Patient? No    PAD/SET Patient? No      Pain Assessment   Currently in Pain? No/denies                Social History   Tobacco Use  Smoking Status Former   Current packs/day: 0.00   Average packs/day: 1 pack/day for 30.0 years (30.0 ttl pk-yrs)   Types: Cigarettes   Start date: 12/19/1971   Quit date: 12/18/2001   Years since quitting: 21.9  Smokeless Tobacco Never    Goals Met:  Proper associated with RPD/PD & O2 Sat [place] with exercise equipment Exercise tolerated well No report of concerns or symptoms today  Goals Unmet:  Not Applicable  Comments: Pt able to follow exercise prescription today without complaint.  Will continue to monitor for progression. BP lower than usual today, had diarrhea on Tuesday. Advised to rehydrate.  BP up after water.  Using sit down only machine today.   Left after 1st round .   Dr. Bethann Punches is Medical Director for [facility].  Dr. Vida Rigger is Medical Director for [facility] Pulmonary Rehabilitation.

## 2023-12-05 ENCOUNTER — Encounter: Payer: Self-pay | Admitting: *Deleted

## 2023-12-05 DIAGNOSIS — R0602 Shortness of breath: Secondary | ICD-10-CM

## 2023-12-05 NOTE — Progress Notes (Signed)
Pulmonary Individual Treatment Plan  Patient Details  Name: Darius Norris MRN: [id number] Date of Birth: May 17, 1951 Referring Provider:   Flowsheet Row Pulmonary Rehab from 10/10/2023 in [facility] Cardiac and Pulmonary Rehab  Referring Provider Dr. Mliss Sax       Initial Encounter Date:  Flowsheet Row Pulmonary Rehab from 10/10/2023 in [facility] Cardiac and Pulmonary Rehab  Date 10/10/23       Visit Diagnosis: Shortness of breath  Patient's Home Medications on Admission:  Current Outpatient Medications:    acetaminophen (TYLENOL) 325 MG tablet, Take 975 mg by mouth 3 (three) times daily., Disp: , Rfl:    albuterol (VENTOLIN HFA) 108 (90 Base) MCG/ACT inhaler, Inhale into the lungs., Disp: , Rfl:    aspirin EC 81 MG EC tablet, Take 1 tablet (81 mg total) by mouth daily., Disp: 180 tablet, Rfl: 0   atorvastatin (LIPITOR) 80 MG tablet, Take 0.5 tablets (40 mg total) by mouth at bedtime. Half of an 80, Disp: , Rfl:    busPIRone (BUSPAR) 10 MG tablet, Take 10 mg by mouth 2 (two) times daily., Disp: , Rfl:    clobetasol (TEMOVATE) 0.05 % external solution, Apply topically. (Patient not taking: Reported on 10/04/2023), Disp: , Rfl:    dabigatran (PRADAXA) 150 MG CAPS capsule, Take 150 mg by mouth 2 (two) times daily., Disp: , Rfl:    diltiazem (CARDIZEM CD) 300 MG 24 hr capsule, Take 1 capsule (300 mg total) by mouth daily., Disp: 30 capsule, Rfl: 0   empagliflozin (JARDIANCE) 25 MG TABS tablet, Take 25 mg by mouth daily. Take 0.5 tablet (12.5 mg) by mouth once daily, Disp: , Rfl:    folic acid (FOLVITE) 1 MG tablet, Take 2 mg by mouth daily. Hold on Sunday when taking Methotrexate, Disp: , Rfl:    furosemide (LASIX) 40 MG tablet, Take 1 tablet (40 mg total) by mouth daily. (Patient taking differently: Take 40 mg by mouth daily. Take only if weight has increased by 3 lbs), Disp: 30 tablet, Rfl: 2   gabapentin (NEURONTIN) 300 MG capsule, Take 600 mg by mouth 3 (three) times daily., Disp: ,  Rfl:    hydrocortisone 2.5 % cream, Apply topically., Disp: , Rfl:    isosorbide mononitrate (IMDUR) 60 MG 24 hr tablet, Take 60 mg by mouth daily., Disp: , Rfl:    lidocaine (LIDODERM) 5 %, Place 1 patch onto the skin daily. Remove & Discard patch within 12 hours or as directed by MD (Patient not taking: Reported on 10/04/2023), Disp: , Rfl:    loratadine (CLARITIN) 10 MG tablet, Take 10 mg by mouth daily as needed for allergies. Take 0.5 tablet (5 mg) by mouth once daily as needed for allergies, Disp: , Rfl:    losartan (COZAAR) 25 MG tablet, Take 25 mg by mouth daily., Disp: , Rfl:    magnesium oxide (MAG-OX) 400 MG tablet, Take 800 mg by mouth daily., Disp: , Rfl:    Melatonin 3 MG CAPS, Take 2 capsules by mouth at bedtime., Disp: , Rfl:    metFORMIN (GLUCOPHAGE) 500 MG tablet, Take 500 mg by mouth 2 (two) times daily with a meal., Disp: , Rfl:    methotrexate (RHEUMATREX) 2.5 MG tablet, Take 12.5 mg by mouth once a week. Take 12.5 mg by mouth once weekly (on Sundays), Disp: , Rfl:    metoprolol tartrate (LOPRESSOR) 100 MG tablet, Take 1 tablet (100 mg total) by mouth 2 (two) times daily., Disp: 60 tablet, Rfl: 2   Mometasone  Furoate 100 MCG/ACT AERO, Inhale 2 puffs into the lungs 2 (two) times daily., Disp: , Rfl:    omeprazole (PRILOSEC) 20 MG capsule, Take 20 mg by mouth daily., Disp: , Rfl:    oxymetazoline (AFRIN) 0.05 % nasal spray, Place 1 spray into both nostrils daily., Disp: , Rfl:    potassium chloride SA (KLOR-CON M) 20 MEQ tablet, Take 20 mEq by mouth daily as needed (With Furosemide)., Disp: , Rfl:    senna (SENOKOT) 8.6 MG TABS tablet, Take 1 tablet by mouth daily as needed for mild constipation., Disp: , Rfl:    simethicone (MYLICON) 80 MG chewable tablet, Chew 80 mg by mouth 4 (four) times daily as needed for flatulence., Disp: , Rfl:    Tiotropium Bromide-Olodaterol (STIOLTO RESPIMAT) 2.5-2.5 MCG/ACT AERS, Inhale 2 each into the lungs 2 (two) times daily., Disp: , Rfl:     traMADol (ULTRAM) 50 MG tablet, Take 1 tablet (50 mg total) by mouth every 6 (six) hours as needed. (Patient not taking: Reported on 08/24/2023), Disp: 10 tablet, Rfl: 0  Past Medical History: Past Medical History:  Diagnosis Date   A-fib (HCC)    CAD (coronary artery disease)    Colitis    COPD (chronic obstructive pulmonary disease) (HCC)    Diabetes mellitus without complication (HCC)    Emphysema lung (HCC)    GERD (gastroesophageal reflux disease)    Hypertension    MI (myocardial infarction) (HCC) 1999    Tobacco Use: Social History   Tobacco Use  Smoking Status Former   Current packs/day: 0.00   Average packs/day: 1 pack/day for 30.0 years (30.0 ttl pk-yrs)   Types: Cigarettes   Start date: 12/19/1971   Quit date: 12/18/2001   Years since quitting: 21.9  Smokeless Tobacco Never    Labs: Review Flowsheet  More data exists      Latest Ref Rng & Units 05/02/2018 01/05/2019 12/06/2021 01/02/2023 01/03/2023  Labs for ITP Cardiac and Pulmonary Rehab  Cholestrol 0 - 200 mg/dL - - 284  - 98   LDL (calc) 0 - 99 mg/dL - - 51  - 53   HDL-C >13 mg/dL - - 33  - 29   Trlycerides <150 mg/dL - - 244  - 81   Hemoglobin A1c 4.8 - 5.6 % - - 5.9  6.0  -  Bicarbonate 20.0 - 28.0 mmol/L 36.3  29.6  - - -  O2 Saturation % - 81.7  - - -     Pulmonary Assessment Scores:  Pulmonary Assessment Scores     Row Name 10/10/23 1552         ADL UCSD   ADL Phase Entry     SOB Score total 58     Rest 2     Walk 3     Stairs 5     Bath 4     Dress 2     Shop 3       CAT Score   CAT Score 24       mMRC Score   mMRC Score 1              UCSD: Self-administered rating of dyspnea associated with activities of daily living (ADLs) 6-point scale (0 = "not at all" to 5 = "maximal or unable to do because of breathlessness")  Scoring Scores range from 0 to 120.  Minimally important difference is 5 units  CAT: CAT can identify the health impairment of COPD patients and is better  correlated with disease progression.  CAT has a scoring range of zero to 40. The CAT score is classified into four groups of low (less than 10), medium (10 - 20), high (21-30) and very high (31-40) based on the impact level of disease on health status. A CAT score over 10 suggests significant symptoms.  A worsening CAT score could be explained by an exacerbation, poor medication adherence, poor inhaler technique, or progression of COPD or comorbid conditions.  CAT MCID is 2 points  mMRC: mMRC (Modified Medical Research Council) Dyspnea Scale is used to assess the degree of baseline functional disability in patients of respiratory disease due to dyspnea. No minimal important difference is established. A decrease in score of 1 point or greater is considered a positive change.   Pulmonary Function Assessment:   Exercise Target Goals: Exercise Program Goal: Individual exercise prescription set using results from initial 6 min walk test and THRR while considering  patient's activity barriers and safety.   Exercise Prescription Goal: Initial exercise prescription builds to 30-45 minutes a day of aerobic activity, 2-3 days per week.  Home exercise guidelines will be given to patient during program as part of exercise prescription that the participant will acknowledge.  Education: Aerobic Exercise: - Group verbal and visual presentation on the components of exercise prescription. Introduces F.I.T.T principle from ACSM for exercise prescriptions.  Reviews F.I.T.T. principles of aerobic exercise including progression. Written material given at graduation. Flowsheet Row Pulmonary Rehab from 11/03/2021 in [facility] Cardiac and Pulmonary Rehab  Date 10/13/21  Educator [facility]  Instruction Review Code 1- Verbalizes Understanding       Education: Resistance Exercise: - Group verbal and visual presentation on the components of exercise prescription. Introduces F.I.T.T principle from ACSM for exercise  prescriptions  Reviews F.I.T.T. principles of resistance exercise including progression. Written material given at graduation.    Education: Exercise & Equipment Safety: - Individual verbal instruction and demonstration of equipment use and safety with use of the equipment. Flowsheet Row Pulmonary Rehab from 11/22/2023 in [facility] Cardiac and Pulmonary Rehab  Date 10/10/23  Educator [facility]  Instruction Review Code 1- Verbalizes Understanding       Education: Exercise Physiology & General Exercise Guidelines: - Group verbal and written instruction with models to review the exercise physiology of the cardiovascular system and associated critical values. Provides general exercise guidelines with specific guidelines to those with heart or lung disease.  Flowsheet Row Pulmonary Rehab from 11/22/2023 in [facility] Cardiac and Pulmonary Rehab  Education need identified 10/10/23       Education: Flexibility, Balance, Mind/Body Relaxation: - Group verbal and visual presentation with interactive activity on the components of exercise prescription. Introduces F.I.T.T principle from ACSM for exercise prescriptions. Reviews F.I.T.T. principles of flexibility and balance exercise training including progression. Also discusses the mind body connection.  Reviews various relaxation techniques to help reduce and manage stress (i.e. Deep breathing, progressive muscle relaxation, and visualization). Balance handout provided to take home. Written material given at graduation. Flowsheet Row Pulmonary Rehab from 11/03/2021 in [facility] Cardiac and Pulmonary Rehab  Date 10/27/21  Educator [facility]  Instruction Review Code 1- Verbalizes Understanding       Activity Barriers & Risk Stratification:  Activity Barriers & Cardiac Risk Stratification - 10/10/23 1555       Activity Barriers & Cardiac Risk Stratification   Activity Barriers Joint Problems;Balance Concerns;Deconditioning;Shortness of Breath;Muscular Weakness    Cardiac  Risk Stratification Low  6 Minute Walk:  6 Minute Walk     Row Name 10/10/23 1552         6 Minute Walk   Phase Initial     Distance 615 feet     Walk Time 5.54 minutes     # of Rest Breaks 1     MPH 1.2     METS 1.1     RPE 13     Perceived Dyspnea  1     VO2 Peak 3.76     Symptoms No     Resting HR 58 bpm     Resting BP 106/52     Resting Oxygen Saturation  96 %     Exercise Oxygen Saturation  during 6 min walk 88 %     Max Ex. HR 68 bpm     Max Ex. BP 126/60     2 Minute Post BP 108/48       Interval HR   1 Minute HR 64     2 Minute HR 66     3 Minute HR 67     4 Minute HR 68     5 Minute HR 68     6 Minute HR 68     2 Minute Post HR 58     Interval Heart Rate? Yes       Interval Oxygen   Interval Oxygen? Yes     Baseline Oxygen Saturation % 96 %     1 Minute Oxygen Saturation % 92 %     1 Minute Liters of Oxygen 4 L     2 Minute Oxygen Saturation % 89 %     2 Minute Liters of Oxygen 4 L     3 Minute Oxygen Saturation % 91 %     3 Minute Liters of Oxygen 4 L     4 Minute Oxygen Saturation % 89 %     4 Minute Liters of Oxygen 4 L     5 Minute Oxygen Saturation % 88 %     5 Minute Liters of Oxygen 4 L     6 Minute Oxygen Saturation % 91 %     6 Minute Liters of Oxygen 4 L     2 Minute Post Oxygen Saturation % 97 %     2 Minute Post Liters of Oxygen 3 L             Oxygen Initial Assessment:  Oxygen Initial Assessment - 10/04/23 1110       Home Oxygen   Home Oxygen Device Home Concentrator;E-Tanks    Sleep Oxygen Prescription Continuous    Liters per minute 3    Home Exercise Oxygen Prescription Continuous    Liters per minute 3    Home Resting Oxygen Prescription Continuous    Liters per minute 3    Compliance with Home Oxygen Use Yes      Intervention   Short Term Goals To learn and exhibit compliance with exercise, home and travel O2 prescription;To learn and understand importance of monitoring SPO2 with pulse oximeter  and demonstrate accurate use of the pulse oximeter.;To learn and understand importance of maintaining oxygen saturations>88%;To learn and demonstrate proper pursed lip breathing techniques or other breathing techniques. ;To learn and demonstrate proper use of respiratory medications    Long  Term Goals Exhibits compliance with exercise, home  and travel O2 prescription;Verbalizes importance of monitoring SPO2 with pulse oximeter and return demonstration;Maintenance of O2 saturations>88%;Exhibits proper  breathing techniques, such as pursed lip breathing or other method taught during program session;Compliance with respiratory medication;Demonstrates proper use of MDI's             Oxygen Re-Evaluation:  Oxygen Re-Evaluation     Row Name 10/16/23 1128 10/18/23 1110           Program Oxygen Prescription   Program Oxygen Prescription Continuous;E-Tanks Continuous;E-Tanks      Liters per minute 3 3        Home Oxygen   Home Oxygen Device Home Concentrator;E-Tanks Home Concentrator;E-Tanks      Sleep Oxygen Prescription Continuous Continuous      Liters per minute 3 3      Home Exercise Oxygen Prescription Continuous Continuous      Liters per minute 3 3      Home Resting Oxygen Prescription Continuous Continuous      Liters per minute 3 3      Compliance with Home Oxygen Use Yes Yes        Goals/Expected Outcomes   Short Term Goals To learn and demonstrate proper pursed lip breathing techniques or other breathing techniques.  To learn and demonstrate proper pursed lip breathing techniques or other breathing techniques.       Long  Term Goals Exhibits proper breathing techniques, such as pursed lip breathing or other method taught during program session Exhibits proper breathing techniques, such as pursed lip breathing or other method taught during program session      Comments Reviewed PLB technique with pt.  Talked about how it works and it's importance in maintaining their exercise  saturations. Informed patient how to perform the Pursed Lipped breathing technique. Told patient to Inhale through the nose and out the mouth with pursed lips to keep their airways open, help oxygenate them better, practice when at rest or doing strenuous activity. Patient Verbalizes understanding of technique and will work on and be reiterated during [name].      Goals/Expected Outcomes Short: Become more profiecient at using PLB. Long: Become independent at using PLB. Short: use PLB with exertion. Long: use PLB on exertion proficiently and independently.               Oxygen Discharge (Final Oxygen Re-Evaluation):  Oxygen Re-Evaluation - 10/18/23 1110       Program Oxygen Prescription   Program Oxygen Prescription Continuous;E-Tanks    Liters per minute 3      Home Oxygen   Home Oxygen Device Home Concentrator;E-Tanks    Sleep Oxygen Prescription Continuous    Liters per minute 3    Home Exercise Oxygen Prescription Continuous    Liters per minute 3    Home Resting Oxygen Prescription Continuous    Liters per minute 3    Compliance with Home Oxygen Use Yes      Goals/Expected Outcomes   Short Term Goals To learn and demonstrate proper pursed lip breathing techniques or other breathing techniques.     Long  Term Goals Exhibits proper breathing techniques, such as pursed lip breathing or other method taught during program session    Comments Informed patient how to perform the Pursed Lipped breathing technique. Told patient to Inhale through the nose and out the mouth with pursed lips to keep their airways open, help oxygenate them better, practice when at rest or doing strenuous activity. Patient Verbalizes understanding of technique and will work on and be reiterated during [name].    Goals/Expected Outcomes Short: use PLB with exertion. Long:  use PLB on exertion proficiently and independently.             Initial Exercise Prescription:  Initial Exercise Prescription -  10/10/23 1500       Date of Initial Exercise RX and Referring Provider   Date 10/10/23    Referring Provider Dr. Mliss Sax      Oxygen   Oxygen Continuous    Liters 3L    Maintain Oxygen Saturation 88% or higher      Treadmill   MPH 1.2    Grade 0    Minutes 15    METs 1.1      NuStep   Level 1    SPM 80    Minutes 15    METs 1.1      T5 Nustep   Level 1    SPM 80    Minutes 15    METs 1.1      Prescription Details   Duration Progress to 30 minutes of continuous aerobic without signs/symptoms of physical distress      Intensity   THRR 40-80% of Max Heartrate 94-130    Ratings of Perceived Exertion 11-13    Perceived Dyspnea 0-4      Progression   Progression Continue to progress workloads to maintain intensity without signs/symptoms of physical distress.      Resistance Training   Training Prescription Yes    Weight 3 lb    Reps 10-15             Perform Capillary Blood Glucose checks as needed.  Exercise Prescription Changes:   Exercise Prescription Changes     Row Name 10/10/23 1500 11/13/23 1000 11/29/23 1600         Response to Exercise   Blood Pressure (Admit) 106/52 122/60 116/58     Blood Pressure (Exercise) 126/60 128/68 --     Blood Pressure (Exit) 108/48 118/60 118/60     Heart Rate (Admit) 58 bpm 66 bpm 74 bpm     Heart Rate (Exercise) 68 bpm 87 bpm 74 bpm     Heart Rate (Exit) 58 bpm 76 bpm 65 bpm     Oxygen Saturation (Admit) 96 % 91 % 91 %     Oxygen Saturation (Exercise) 88 % 90 % 91 %     Oxygen Saturation (Exit) 97 % 99 % 98 %     Rating of Perceived Exertion (Exercise) 13 12 13      Perceived Dyspnea (Exercise) 1 3 1      Symptoms none none none     Comments results first 2 weeks of exercise --     Duration Progress to 30 minutes of  aerobic without signs/symptoms of physical distress Progress to 30 minutes of  aerobic without signs/symptoms of physical distress Progress to 30 minutes of  aerobic without  signs/symptoms of physical distress     Intensity THRR New THRR New THRR unchanged       Progression   Progression Continue to progress workloads to maintain intensity without signs/symptoms of physical distress. Continue to progress workloads to maintain intensity without signs/symptoms of physical distress. Continue to progress workloads to maintain intensity without signs/symptoms of physical distress.     Average METs 1.1 2.11 1.92       Resistance Training   Training Prescription -- Yes Yes     Weight -- 3 lb 3 lb     Reps -- 10-15 10-15       Interval Training  Interval Training -- No No       Oxygen   Oxygen -- Continuous Continuous     Liters -- 3L 3       Treadmill   MPH -- 1.2 1.1     Grade -- 0 0     Minutes -- 15 15     METs -- 1.92 1.84       NuStep   Level -- 2 --     Minutes -- 15 --     METs -- 2.4 --       T5 Nustep   Level -- 1 1     Minutes -- 15 15       Biostep-RELP   Level -- -- 1     Minutes -- -- 15     METs -- -- 2       Oxygen   Maintain Oxygen Saturation -- 88% or higher 88% or higher              Exercise Comments:   Exercise Comments     Row Name 10/16/23 1126           Exercise Comments First full day of exercise!  Patient was oriented to gym and equipment including functions, settings, policies, and procedures.  Patient's individual exercise prescription and treatment plan were reviewed.  All starting workloads were established based on the results of the 6 minute walk test done at initial orientation visit.  The plan for exercise progression was also introduced and progression will be customized based on patient's performance and goals.                Exercise Goals and Review:   Exercise Goals     Row Name 10/10/23 1600             Exercise Goals   Increase Physical Activity Yes       Intervention Provide advice, education, support and counseling about physical activity/exercise needs.;Develop an  individualized exercise prescription for aerobic and resistive training based on initial evaluation findings, risk stratification, comorbidities and participant's personal goals.       Expected Outcomes Short Term: Attend rehab on a regular basis to increase amount of physical activity.;Long Term: Add in home exercise to make exercise part of routine and to increase amount of physical activity.;Long Term: Exercising regularly at least 3-5 days a week.       Increase Strength and Stamina Yes       Intervention Provide advice, education, support and counseling about physical activity/exercise needs.;Develop an individualized exercise prescription for aerobic and resistive training based on initial evaluation findings, risk stratification, comorbidities and participant's personal goals.       Expected Outcomes Short Term: Increase workloads from initial exercise prescription for resistance, speed, and METs.;Short Term: Perform resistance training exercises routinely during rehab and add in resistance training at home;Long Term: Improve cardiorespiratory fitness, muscular endurance and strength as measured by increased METs and functional capacity ( )       Able to understand and use rate of perceived exertion (RPE) scale Yes       Intervention Provide education and explanation on how to use RPE scale       Expected Outcomes Short Term: Able to use RPE daily in rehab to express subjective intensity level;Long Term:  Able to use RPE to guide intensity level when exercising independently       Able to understand and use Dyspnea scale Yes  Intervention Provide education and explanation on how to use Dyspnea scale       Expected Outcomes Short Term: Able to use Dyspnea scale daily in rehab to express subjective sense of shortness of breath during exertion;Long Term: Able to use Dyspnea scale to guide intensity level when exercising independently       Knowledge and understanding of Target Heart Rate Range  (THRR) Yes       Intervention Provide education and explanation of THRR including how the numbers were predicted and where they are located for reference       Expected Outcomes Short Term: Able to state/look up THRR;Short Term: Able to use daily as guideline for intensity in rehab;Long Term: Able to use THRR to govern intensity when exercising independently       Able to check pulse independently Yes       Intervention Provide education and demonstration on how to check pulse in carotid and radial arteries.;Review the importance of being able to check your own pulse for safety during independent exercise       Expected Outcomes Short Term: Able to explain why pulse checking is important during independent exercise;Long Term: Able to check pulse independently and accurately       Understanding of Exercise Prescription Yes       Intervention Provide education, explanation, and written materials on patient's individual exercise prescription       Expected Outcomes Short Term: Able to explain program exercise prescription;Long Term: Able to explain home exercise prescription to exercise independently                Exercise Goals Re-Evaluation :  Exercise Goals Re-Evaluation     Row Name 10/16/23 1127 11/13/23 1006 11/29/23 1602         Exercise Goal Re-Evaluation   Exercise Goals Review Able to understand and use rate of perceived exertion (RPE) scale;Knowledge and understanding of Target Heart Rate Range (THRR);Able to understand and use Dyspnea scale;Understanding of Exercise Prescription Increase Physical Activity;Increase Strength and Stamina;Understanding of Exercise Prescription Increase Physical Activity;Increase Strength and Stamina;Understanding of Exercise Prescription     Comments Reviewed RPE and dyspnea scale, THR and program prescription with pt today.  Pt voiced understanding and was given a copy of goals to take home. Kathlene November is off to a great start in the program. He has been  able to increase his level on the T4 nustep to level 2. He has alos been able to maintain his workload on the treadmill and the T5 nustep. We will continue to monitor his progress in the program. Kathlene November only attended one full session of rehab since the last review. He came for a second session but had to leave after first rounds due to feeling sick and having low blood pressure. He has continued to do well on the treadmill at a speed of 1.1 mph with no incline. He also has worked at level 1 on the T5 nustep and began using the biostep at level 1. We will continue to monitor his progress in the program.     Expected Outcomes Short: Use RPE daily to regulate intensity. Long: Follow program prescription in THR. Short: Continue to follow current exercise prescription. Long: Continue exercise to improve strength and stamina. Short: Return to rehab when appropriate. Long: Continue exercise to improve strength and stamina.              Discharge Exercise Prescription (Final Exercise Prescription Changes):  Exercise Prescription Changes - 11/29/23 1600  Response to Exercise   Blood Pressure (Admit) 116/58    Blood Pressure (Exit) 118/60    Heart Rate (Admit) 74 bpm    Heart Rate (Exercise) 74 bpm    Heart Rate (Exit) 65 bpm    Oxygen Saturation (Admit) 91 %    Oxygen Saturation (Exercise) 91 %    Oxygen Saturation (Exit) 98 %    Rating of Perceived Exertion (Exercise) 13    Perceived Dyspnea (Exercise) 1    Symptoms none    Duration Progress to 30 minutes of  aerobic without signs/symptoms of physical distress    Intensity THRR unchanged      Progression   Progression Continue to progress workloads to maintain intensity without signs/symptoms of physical distress.    Average METs 1.92      Resistance Training   Training Prescription Yes    Weight 3 lb    Reps 10-15      Interval Training   Interval Training No      Oxygen   Oxygen Continuous    Liters 3      Treadmill   MPH  1.1    Grade 0    Minutes 15    METs 1.84      T5 Nustep   Level 1    Minutes 15      Biostep-RELP   Level 1    Minutes 15    METs 2      Oxygen   Maintain Oxygen Saturation 88% or higher             Nutrition:  Target Goals: Understanding of nutrition guidelines, daily intake of sodium 1500mg , cholesterol 200mg , calories 30% from fat and 7% or less from saturated fats, daily to have 5 or more servings of fruits and vegetables.  Education: All About Nutrition: -Group instruction provided by verbal, written material, interactive activities, discussions, models, and posters to present general guidelines for heart healthy nutrition including fat, fiber, MyPlate, the role of sodium in heart healthy nutrition, utilization of the nutrition label, and utilization of this knowledge for meal planning. Follow up email sent as well. Written material given at graduation. Flowsheet Row Pulmonary Rehab from 11/22/2023 in [facility] Cardiac and Pulmonary Rehab  Date 10/25/23  Educator JG  Instruction Review Code 1- Verbalizes Understanding       Biometrics:  Pre Biometrics - 10/10/23 1600       Pre Biometrics   Height 5' 5.6" (1.666 m)    Weight 185 lb 11.2 oz (84.2 kg)    Waist Circumference 43 inches    Hip Circumference 44.5 inches    Waist to Hip Ratio 0.97 %    BMI (Calculated) 30.35    Single Leg Stand 0 seconds              Nutrition Therapy Plan and Nutrition Goals:   Nutrition Assessments:  MEDIFICTS Score Key: >=70 Need to make dietary changes  40-70 Heart Healthy Diet <= 40 Therapeutic Level Cholesterol Diet  Flowsheet Row Pulmonary Rehab from 10/10/2023 in [facility] Cardiac and Pulmonary Rehab  Picture Your Plate Total Score on Admission 58      Picture Your Plate Scores: <95 Unhealthy dietary pattern with much room for improvement. 41-50 Dietary pattern unlikely to meet recommendations for good health and room for improvement. 51-60 More healthful dietary  pattern, with some room for improvement.  >60 Healthy dietary pattern, although there may be some specific behaviors that could be improved.   Nutrition Goals  Re-Evaluation:  Nutrition Goals Re-Evaluation     Row Name 10/18/23 1112             Goals   Current Weight 183 lb (83 kg)       Comment Patient was informed on why it is important to maintain a balanced diet when dealing with Respiratory issues. Explained that it takes a lot of energy to breath and when they are short of breath often they will need to have a good diet to help keep up with the calories they are expending for breathing.       Expected Outcome Short: Choose and plan snacks accordingly to patients caloric intake to improve breathing. Long: Maintain a diet independently that meets their caloric intake to aid in daily shortness of breath.                Nutrition Goals Discharge (Final Nutrition Goals Re-Evaluation):  Nutrition Goals Re-Evaluation - 10/18/23 1112       Goals   Current Weight 183 lb (83 kg)    Comment Patient was informed on why it is important to maintain a balanced diet when dealing with Respiratory issues. Explained that it takes a lot of energy to breath and when they are short of breath often they will need to have a good diet to help keep up with the calories they are expending for breathing.    Expected Outcome Short: Choose and plan snacks accordingly to patients caloric intake to improve breathing. Long: Maintain a diet independently that meets their caloric intake to aid in daily shortness of breath.             Psychosocial: Target Goals: Acknowledge presence or absence of significant depression and/or stress, maximize coping skills, provide positive support system. Participant is able to verbalize types and ability to use techniques and skills needed for reducing stress and depression.   Education: Stress, Anxiety, and Depression - Group verbal and visual presentation to define  topics covered.  Reviews how body is impacted by stress, anxiety, and depression.  Also discusses healthy ways to reduce stress and to treat/manage anxiety and depression.  Written material given at graduation. Flowsheet Row Pulmonary Rehab from 11/03/2021 in [facility] Cardiac and Pulmonary Rehab  Date 07/28/21  Educator [facility]  Instruction Review Code 1- [employer] Understanding       Education: Sleep Hygiene -Provides group verbal and written instruction about how sleep can affect your health.  Define sleep hygiene, discuss sleep cycles and impact of sleep habits. Review good sleep hygiene tips.    Initial Review & Psychosocial Screening:  Initial Psych Review & Screening - 10/04/23 1111       Initial Review   Current issues with History of Depression;Current Psychotropic Meds;Current Sleep Concerns      Family Dynamics   Good Support System? Yes   wife, older sister   Concerns Recent loss of significant other   youngest sister passed away 6 months a go,  nephew died 3 months ago.     Barriers   Psychosocial barriers to participate in program There are no identifiable barriers or psychosocial needs.      Screening Interventions   Interventions Encouraged to exercise;To provide support and resources with identified psychosocial needs;Provide feedback about the scores to participant    Expected Outcomes Short Term goal: Utilizing psychosocial counselor, staff and physician to assist with identification of specific Stressors or current issues interfering with healing process. Setting desired goal for each stressor or current  issue identified.;Long Term Goal: Stressors or current issues are controlled or eliminated.;Short Term goal: Identification and review with participant of any Quality of Life or Depression concerns found by scoring the questionnaire.;Long Term goal: The participant improves quality of Life and PHQ9 Scores as seen by post scores and/or verbalization of changes              Quality of Life Scores:  Scores of 19 and below usually indicate a poorer quality of life in these areas.  A difference of  2-3 points is a clinically meaningful difference.  A difference of 2-3 points in the total score of the Quality of Life Index has been associated with significant improvement in overall quality of life, self-image, physical symptoms, and general health in studies assessing change in quality of life.  PHQ-9: Review Flowsheet  More data exists      10/10/2023 11/29/2022 07/18/2022 06/19/2022 11/17/2021  Depression screen PHQ 2/9  Decreased Interest 1 1 1 2 1   Down, Depressed, Hopeless 0 0 0 1 0  PHQ - 2 Score 1 1 1 3 1   Altered sleeping 2 3 3 2 2   Tired, decreased energy 1 2 2 2 1   Change in appetite 0 0 0 0 1  Feeling bad or failure about yourself  0 1 0 0 0  Trouble concentrating 0 0 0 0 0  Moving slowly or fidgety/restless 0 0 0 0 0  Suicidal thoughts 0 0 0 0 0  PHQ-9 Score 4 7 6 7 5   Difficult doing work/chores Somewhat difficult Somewhat difficult Somewhat difficult Not difficult at all Somewhat difficult   Interpretation of Total Score  Total Score Depression Severity:  1-4 = Minimal depression, 5-9 = Mild depression, 10-14 = Moderate depression, 15-19 = Moderately severe depression, 20-27 = Severe depression   Psychosocial Evaluation and Intervention:  Psychosocial Evaluation - 10/04/23 1131       Psychosocial Evaluation & Interventions   Interventions Encouraged to exercise with the program and follow exercise prescription    Comments MIke has no barriers to attending the program. He has a history of depression and is taking meds that have his symptoms well controlled.  He is having trouble sleeping. He has to wake up to relieve the stuffiness  from the use of oxygen in his nose. Advised him to talk to his doctor about some humidity at night for his oxygen or look for saline gel for his nostrils to help keep them moist.  . He stated he had some gel and  will try it again.  MIke had 2 family members pass away in the last year. One was his younger sister and the other a nephew.   He stated he was doing okay fter their deaths.  He lives with his wife and she and his older sister are his support people.  His wife continues to workand he  experessed he thought it may be hard for her.  He is ready to return to the program.             Psychosocial Re-Evaluation:  Psychosocial Re-Evaluation     Row Name 10/18/23 1113             Psychosocial Re-Evaluation   Current issues with None Identified       Comments Patient reports no issues with their current mental states, sleep, stress, depression or anxiety. Will follow up with patient in a few weeks for any changes.       Expected  Outcomes Short: Continue to exercise regularly to support mental health and notify staff of any changes. Long: maintain mental health and well being through teaching of rehab or prescribed medications independently.       Interventions Encouraged to attend Pulmonary Rehabilitation for the exercise       Continue Psychosocial Services  Follow up required by staff                Psychosocial Discharge (Final Psychosocial Re-Evaluation):  Psychosocial Re-Evaluation - 10/18/23 1113       Psychosocial Re-Evaluation   Current issues with None Identified    Comments Patient reports no issues with their current mental states, sleep, stress, depression or anxiety. Will follow up with patient in a few weeks for any changes.    Expected Outcomes Short: Continue to exercise regularly to support mental health and notify staff of any changes. Long: maintain mental health and well being through teaching of rehab or prescribed medications independently.    Interventions Encouraged to attend Pulmonary Rehabilitation for the exercise    Continue Psychosocial Services  Follow up required by staff             Education: Education Goals: Education classes will be provided  on a weekly basis, covering required topics. Participant will state understanding/return demonstration of topics presented.  Learning Barriers/Preferences:   General Pulmonary Education Topics:  Infection Prevention: - Provides verbal and written material to individual with discussion of infection control including proper hand washing and proper equipment cleaning during exercise session. Flowsheet Row Pulmonary Rehab from 11/22/2023 in [facility] Cardiac and Pulmonary Rehab  Date 10/10/23  Educator [facility]  Instruction Review Code 1- Verbalizes Understanding       Falls Prevention: - Provides verbal and written material to individual with discussion of falls prevention and safety. Flowsheet Row Pulmonary Rehab from 11/22/2023 in [facility] Cardiac and Pulmonary Rehab  Date 10/10/23  Educator [facility]  Instruction Review Code 1- Verbalizes Understanding       Chronic Lung Disease Review: - Group verbal instruction with posters, models, PowerPoint presentations and videos,  to review new updates, new respiratory medications, new advancements in procedures and treatments. Providing information on websites and "800" numbers for continued self-education. Includes information about supplement oxygen, available portable oxygen systems, continuous and intermittent flow rates, oxygen safety, concentrators, and Medicare reimbursement for oxygen. Explanation of Pulmonary Drugs, including class, frequency, complications, importance of spacers, rinsing mouth after steroid MDI's, and proper cleaning methods for nebulizers. Review of basic lung anatomy and physiology related to function, structure, and complications of lung disease. Review of risk factors. Discussion about methods for diagnosing sleep apnea and types of masks and machines for OSA. Includes a review of the use of types of environmental controls: home humidity, furnaces, filters, dust mite/pet prevention, HEPA vacuums. Discussion about weather changes, air  quality and the benefits of nasal washing. Instruction on Warning signs, infection symptoms, calling MD promptly, preventive modes, and value of vaccinations. Review of effective airway clearance, coughing and/or vibration techniques. Emphasizing that all should Create an Action Plan. Written material given at graduation. Flowsheet Row Pulmonary Rehab from 11/22/2023 in [facility] Cardiac and Pulmonary Rehab  Education need identified 10/10/23       AED/CPR: - Group verbal and written instruction with the use of models to demonstrate the basic use of the AED with the basic ABC's of resuscitation. Flowsheet Row Pulmonary Rehab from 02/26/2019 in [facility] Cardiac and Pulmonary Rehab  Date 01/01/19  Educator [facility]  Instruction Review  Code 1- Arts administrator and Cardiac Procedures: - Group verbal and visual presentation and models provide information about basic cardiac anatomy and function. Reviews the testing methods done to diagnose heart disease and the outcomes of the test results. Describes the treatment choices: Medical Management, Angioplasty, or Coronary Bypass Surgery for treating various heart conditions including Myocardial Infarction, Angina, Valve Disease, and Cardiac Arrhythmias.  Written material given at graduation. Flowsheet Row Pulmonary Rehab from 11/22/2023 in [facility] Cardiac and Pulmonary Rehab  Date 11/08/23  Educator SB  Instruction Review Code 1- Verbalizes Understanding       Medication Safety: - Group verbal and visual instruction to review commonly prescribed medications for heart and lung disease. Reviews the medication, class of the drug, and side effects. Includes the steps to properly store meds and maintain the prescription regimen.  Written material given at graduation. Flowsheet Row Pulmonary Rehab from 11/03/2021 in [facility] Cardiac and Pulmonary Rehab  Date 09/01/21  Educator [facility]  Instruction Review Code 1- Verbalizes Understanding        Other: -Provides group and verbal instruction on various topics (see comments)   Knowledge Questionnaire Score:  Knowledge Questionnaire Score - 10/10/23 1555       Knowledge Questionnaire Score   Pre Score 15/18              Core Components/Risk Factors/Patient Goals at Admission:  Personal Goals and Risk Factors at Admission - 10/04/23 1120       Core Components/Risk Factors/Patient Goals on Admission    Weight Management Yes    Intervention Weight Management: Develop a combined nutrition and exercise program designed to reach desired caloric intake, while maintaining appropriate intake of nutrient and fiber, sodium and fats, and appropriate energy expenditure required for the weight goal.;Weight Management: Provide education and appropriate resources to help participant work on and attain dietary goals.    Admit Weight 179 lb (81.2 kg)    Goal Weight: Long Term 150 lb (68 kg)    Expected Outcomes Short Term: Continue to assess and modify interventions until short term weight is achieved;Long Term: Adherence to nutrition and physical activity/exercise program aimed toward attainment of established weight goal;Weight Loss: Understanding of general recommendations for a balanced deficit meal plan, which promotes 1-2 lb weight loss per week and includes a negative energy balance of [id number] kcal/d    Improve shortness of breath with ADL's Yes    Intervention Provide education, individualized exercise plan and daily activity instruction to help decrease symptoms of SOB with activities of daily living.    Expected Outcomes Short Term: Improve cardiorespiratory fitness to achieve a reduction of symptoms when performing ADLs;Long Term: Be able to perform more ADLs without symptoms or delay the onset of symptoms    Increase knowledge of respiratory medications and ability to use respiratory devices properly  Yes    Intervention Provide education and demonstration as needed of  appropriate use of medications, inhalers, and oxygen therapy.    Expected Outcomes Short Term: Achieves understanding of medications use. Understands that oxygen is a medication prescribed by physician. Demonstrates appropriate use of inhaler and oxygen therapy.;Long Term: Maintain appropriate use of medications, inhalers, and oxygen therapy.    Diabetes Yes    Intervention Provide education about signs/symptoms and action to take for hypo/hyperglycemia.;Provide education about proper nutrition, including hydration, and aerobic/resistive exercise prescription along with prescribed medications to achieve blood glucose in normal ranges: Fasting glucose 65-99 mg/dL    Expected  Outcomes Short Term: Participant verbalizes understanding of the signs/symptoms and immediate care of hyper/hypoglycemia, proper foot care and importance of medication, aerobic/resistive exercise and nutrition plan for blood glucose control.;Long Term: Attainment of HbA1C < 7%.    Hypertension Yes    Intervention Provide education on lifestyle modifcations including regular physical activity/exercise, weight management, moderate sodium restriction and increased consumption of fresh fruit, vegetables, and low fat dairy, alcohol moderation, and smoking cessation.;Monitor prescription use compliance.    Expected Outcomes Short Term: Continued assessment and intervention until BP is < 140/57mm HG in hypertensive participants. < 130/30mm HG in hypertensive participants with diabetes, heart failure or chronic kidney disease.;Long Term: Maintenance of blood pressure at goal levels.    Lipids Yes    Intervention Provide education and support for participant on nutrition & aerobic/resistive exercise along with prescribed medications to achieve LDL 70mg , HDL >40mg .    Expected Outcomes Short Term: Participant states understanding of desired cholesterol values and is compliant with medications prescribed. Participant is following exercise  prescription and nutrition guidelines.;Long Term: Cholesterol controlled with medications as prescribed, with individualized exercise RX and with personalized nutrition plan. Value goals: LDL < 70mg , HDL > 40 mg.             Education:Diabetes - Individual verbal and written instruction to review signs/symptoms of diabetes, desired ranges of glucose level fasting, after meals and with exercise. Acknowledge that pre and post exercise glucose checks will be done for 3 sessions at entry of program. Flowsheet Row Pulmonary Rehab from 07/27/2022 in [facility] Cardiac and Pulmonary Rehab  Date 05/08/22  Educator [facility]  Instruction Review Code 1- Verbalizes Understanding       Know Your Numbers and Heart Failure: - Group verbal and visual instruction to discuss disease risk factors for cardiac and pulmonary disease and treatment options.  Reviews associated critical values for Overweight/Obesity, Hypertension, Cholesterol, and Diabetes.  Discusses basics of heart failure: signs/symptoms and treatments.  Introduces Heart Failure Zone chart for action plan for heart failure.  Written material given at graduation. Flowsheet Row Pulmonary Rehab from 11/22/2023 in [facility] Cardiac and Pulmonary Rehab  Date 11/22/23  Educator [facility]  Instruction Review Code 1- Verbalizes Understanding       Core Components/Risk Factors/Patient Goals Review:   Goals and Risk Factor Review     Row Name 10/18/23 1111             Core Components/Risk Factors/Patient Goals Review   Personal Goals Review Improve shortness of breath with ADL's       Review Spoke to patient about their shortness of breath and what they can do to improve. Patient has been informed of breathing techniques when starting the program. Patient is informed to tell staff if they have had any med changes and that certain meds they are taking or not taking can be causing shortness of breath.       Expected Outcomes Short: Attend [name] regularly to improve  shortness of breath with ADL's. Long: maintain independence with ADL's                Core Components/Risk Factors/Patient Goals at Discharge (Final Review):   Goals and Risk Factor Review - 10/18/23 1111       Core Components/Risk Factors/Patient Goals Review   Personal Goals Review Improve shortness of breath with ADL's    Review Spoke to patient about their shortness of breath and what they can do to improve. Patient has been informed of breathing techniques when starting  the program. Patient is informed to tell staff if they have had any med changes and that certain meds they are taking or not taking can be causing shortness of breath.    Expected Outcomes Short: Attend [name] regularly to improve shortness of breath with ADL's. Long: maintain independence with ADL's             ITP Comments:  ITP Comments     Row Name 10/04/23 1124 10/04/23 1138 10/10/23 1551 10/16/23 1126 10/17/23 0853   ITP Comments Virtual orientation call completed today. he has an appointment on Date: 10/10/2023  for EP eval and gym Orientation.  Documentation of diagnosis can be found in [facility]  CE Date: 07/24/2023   08/23/2023 . Virtual orientation call completed today. he has an appointment on Date: 10/10/2023  for EP eval and gym Orientation.  Documentation of diagnosis can be found in [facility]  CE Date: 07/24/2023   08/23/2023 .     Kathlene November was hospitalized for Covid in Sept. He is still gaining back his energy after discharge. Completed and gym orientation. Initial ITP created and sent for review to Dr. Jinny Sanders, Medical Director. First full day of exercise!  Patient was oriented to gym and equipment including functions, settings, policies, and procedures.  Patient's individual exercise prescription and treatment plan were reviewed.  All starting workloads were established based on the results of the 6 minute walk test done at initial orientation visit.  The plan for exercise progression was also introduced and  progression will be customized based on patient's performance and goals. 30 Day review completed. Medical Director ITP review done, changes made as directed, and signed approval by Medical Director.    new to program    Row Name 11/07/23 0931 11/22/23 1144 12/05/23 1146       ITP Comments 30 Day review completed. Medical Director ITP review done, changes made as directed, and signed approval by Medical Director. BP lower than usual today, had diarrhea on Tuesday. Advised to rehydrate.  BP up after water.  Using sit down only machine today.   Left after 1st round . 30 Day review completed. Medical Director ITP review done, changes made as directed, and signed approval by Medical Director.              Comments:

## 2023-12-17 ENCOUNTER — Encounter: Payer: Self-pay | Admitting: *Deleted

## 2023-12-17 ENCOUNTER — Telehealth: Payer: Self-pay | Admitting: *Deleted

## 2023-12-17 DIAGNOSIS — J449 Chronic obstructive pulmonary disease, unspecified: Secondary | ICD-10-CM

## 2023-12-17 DIAGNOSIS — R0602 Shortness of breath: Secondary | ICD-10-CM

## 2023-12-17 NOTE — Progress Notes (Unsigned)
Pulmonary Individual Treatment Plan  Patient Details  Name: Darius Norris MRN: [id number] Date of Birth: 1951/06/21 Referring Provider:   Flowsheet Row Pulmonary Rehab from 10/10/2023 in [facility] Cardiac and Pulmonary Rehab  Referring Provider Dr. Mliss Sax       Initial Encounter Date:  Flowsheet Row Pulmonary Rehab from 10/10/2023 in [facility] Cardiac and Pulmonary Rehab  Date 10/10/23       Visit Diagnosis: Shortness of breath  Stage 3 severe COPD by GOLD classification (HCC)  Patient's Home Medications on Admission:  Current Outpatient Medications:    acetaminophen (TYLENOL) 325 MG tablet, Take 975 mg by mouth 3 (three) times daily., Disp: , Rfl:    albuterol (VENTOLIN HFA) 108 (90 Base) MCG/ACT inhaler, Inhale into the lungs., Disp: , Rfl:    aspirin EC 81 MG EC tablet, Take 1 tablet (81 mg total) by mouth daily., Disp: 180 tablet, Rfl: 0   atorvastatin (LIPITOR) 80 MG tablet, Take 0.5 tablets (40 mg total) by mouth at bedtime. Half of an 80, Disp: , Rfl:    busPIRone (BUSPAR) 10 MG tablet, Take 10 mg by mouth 2 (two) times daily., Disp: , Rfl:    clobetasol (TEMOVATE) 0.05 % external solution, Apply topically. (Patient not taking: Reported on 10/04/2023), Disp: , Rfl:    dabigatran (PRADAXA) 150 MG CAPS capsule, Take 150 mg by mouth 2 (two) times daily., Disp: , Rfl:    diltiazem (CARDIZEM CD) 300 MG 24 hr capsule, Take 1 capsule (300 mg total) by mouth daily., Disp: 30 capsule, Rfl: 0   empagliflozin (JARDIANCE) 25 MG TABS tablet, Take 25 mg by mouth daily. Take 0.5 tablet (12.5 mg) by mouth once daily, Disp: , Rfl:    folic acid (FOLVITE) 1 MG tablet, Take 2 mg by mouth daily. Hold on Sunday when taking Methotrexate, Disp: , Rfl:    furosemide (LASIX) 40 MG tablet, Take 1 tablet (40 mg total) by mouth daily. (Patient taking differently: Take 40 mg by mouth daily. Take only if weight has increased by 3 lbs), Disp: 30 tablet, Rfl: 2   gabapentin (NEURONTIN) 300 MG capsule,  Take 600 mg by mouth 3 (three) times daily., Disp: , Rfl:    hydrocortisone 2.5 % cream, Apply topically., Disp: , Rfl:    isosorbide mononitrate (IMDUR) 60 MG 24 hr tablet, Take 60 mg by mouth daily., Disp: , Rfl:    lidocaine (LIDODERM) 5 %, Place 1 patch onto the skin daily. Remove & Discard patch within 12 hours or as directed by MD (Patient not taking: Reported on 10/04/2023), Disp: , Rfl:    loratadine (CLARITIN) 10 MG tablet, Take 10 mg by mouth daily as needed for allergies. Take 0.5 tablet (5 mg) by mouth once daily as needed for allergies, Disp: , Rfl:    losartan (COZAAR) 25 MG tablet, Take 25 mg by mouth daily., Disp: , Rfl:    magnesium oxide (MAG-OX) 400 MG tablet, Take 800 mg by mouth daily., Disp: , Rfl:    Melatonin 3 MG CAPS, Take 2 capsules by mouth at bedtime., Disp: , Rfl:    metFORMIN (GLUCOPHAGE) 500 MG tablet, Take 500 mg by mouth 2 (two) times daily with a meal., Disp: , Rfl:    methotrexate (RHEUMATREX) 2.5 MG tablet, Take 12.5 mg by mouth once a week. Take 12.5 mg by mouth once weekly (on Sundays), Disp: , Rfl:    metoprolol tartrate (LOPRESSOR) 100 MG tablet, Take 1 tablet (100 mg total) by mouth 2 (two) times  daily., Disp: 60 tablet, Rfl: 2   Mometasone Furoate 100 MCG/ACT AERO, Inhale 2 puffs into the lungs 2 (two) times daily., Disp: , Rfl:    omeprazole (PRILOSEC) 20 MG capsule, Take 20 mg by mouth daily., Disp: , Rfl:    oxymetazoline (AFRIN) 0.05 % nasal spray, Place 1 spray into both nostrils daily., Disp: , Rfl:    potassium chloride SA (KLOR-CON M) 20 MEQ tablet, Take 20 mEq by mouth daily as needed (With Furosemide)., Disp: , Rfl:    senna (SENOKOT) 8.6 MG TABS tablet, Take 1 tablet by mouth daily as needed for mild constipation., Disp: , Rfl:    simethicone (MYLICON) 80 MG chewable tablet, Chew 80 mg by mouth 4 (four) times daily as needed for flatulence., Disp: , Rfl:    Tiotropium Bromide-Olodaterol (STIOLTO RESPIMAT) 2.5-2.5 MCG/ACT AERS, Inhale 2 each into  the lungs 2 (two) times daily., Disp: , Rfl:    traMADol (ULTRAM) 50 MG tablet, Take 1 tablet (50 mg total) by mouth every 6 (six) hours as needed. (Patient not taking: Reported on 08/24/2023), Disp: 10 tablet, Rfl: 0  Past Medical History: Past Medical History:  Diagnosis Date   A-fib (HCC)    CAD (coronary artery disease)    Colitis    COPD (chronic obstructive pulmonary disease) (HCC)    Diabetes mellitus without complication (HCC)    Emphysema lung (HCC)    GERD (gastroesophageal reflux disease)    Hypertension    MI (myocardial infarction) (HCC) 1999    Tobacco Use: Social History   Tobacco Use  Smoking Status Former   Current packs/day: 0.00   Average packs/day: 1 pack/day for 30.0 years (30.0 ttl pk-yrs)   Types: Cigarettes   Start date: 12/19/1971   Quit date: 12/18/2001   Years since quitting: 22.0  Smokeless Tobacco Never    Labs: Review Flowsheet  More data exists      Latest Ref Rng & Units 05/02/2018 01/05/2019 12/06/2021 01/02/2023 01/03/2023  Labs for ITP Cardiac and Pulmonary Rehab  Cholestrol 0 - 200 mg/dL - - 562  - 98   LDL (calc) 0 - 99 mg/dL - - 51  - 53   HDL-C >13 mg/dL - - 33  - 29   Trlycerides <150 mg/dL - - 086  - 81   Hemoglobin A1c 4.8 - 5.6 % - - 5.9  6.0  -  Bicarbonate 20.0 - 28.0 mmol/L 36.3  29.6  - - -  O2 Saturation % - 81.7  - - -     Pulmonary Assessment Scores:  Pulmonary Assessment Scores     Row Name 10/10/23 1552         ADL UCSD   ADL Phase Entry     SOB Score total 58     Rest 2     Walk 3     Stairs 5     Bath 4     Dress 2     Shop 3       CAT Score   CAT Score 24       mMRC Score   mMRC Score 1              UCSD: Self-administered rating of dyspnea associated with activities of daily living (ADLs) 6-point scale (0 = "not at all" to 5 = "maximal or unable to do because of breathlessness")  Scoring Scores range from 0 to 120.  Minimally important difference is 5 units  CAT: CAT can identify  the health  impairment of COPD patients and is better correlated with disease progression.  CAT has a scoring range of zero to 40. The CAT score is classified into four groups of low (less than 10), medium (10 - 20), high (21-30) and very high (31-40) based on the impact level of disease on health status. A CAT score over 10 suggests significant symptoms.  A worsening CAT score could be explained by an exacerbation, poor medication adherence, poor inhaler technique, or progression of COPD or comorbid conditions.  CAT MCID is 2 points  mMRC: mMRC (Modified Medical Research Council) Dyspnea Scale is used to assess the degree of baseline functional disability in patients of respiratory disease due to dyspnea. No minimal important difference is established. A decrease in score of 1 point or greater is considered a positive change.   Pulmonary Function Assessment:   Exercise Target Goals: Exercise Program Goal: Individual exercise prescription set using results from initial 6 min walk test and THRR while considering  patient's activity barriers and safety.   Exercise Prescription Goal: Initial exercise prescription builds to 30-45 minutes a day of aerobic activity, 2-3 days per week.  Home exercise guidelines will be given to patient during program as part of exercise prescription that the participant will acknowledge.  Education: Aerobic Exercise: - Group verbal and visual presentation on the components of exercise prescription. Introduces F.I.T.T principle from ACSM for exercise prescriptions.  Reviews F.I.T.T. principles of aerobic exercise including progression. Written material given at graduation. Flowsheet Row Pulmonary Rehab from 11/03/2021 in [facility] Cardiac and Pulmonary Rehab  Date 10/13/21  Educator [facility]  Instruction Review Code 1- Verbalizes Understanding       Education: Resistance Exercise: - Group verbal and visual presentation on the components of exercise prescription. Introduces F.I.T.T  principle from ACSM for exercise prescriptions  Reviews F.I.T.T. principles of resistance exercise including progression. Written material given at graduation.    Education: Exercise & Equipment Safety: - Individual verbal instruction and demonstration of equipment use and safety with use of the equipment. Flowsheet Row Pulmonary Rehab from 11/22/2023 in [facility] Cardiac and Pulmonary Rehab  Date 10/10/23  Educator [facility]  Instruction Review Code 1- Verbalizes Understanding       Education: Exercise Physiology & General Exercise Guidelines: - Group verbal and written instruction with models to review the exercise physiology of the cardiovascular system and associated critical values. Provides general exercise guidelines with specific guidelines to those with heart or lung disease.  Flowsheet Row Pulmonary Rehab from 11/22/2023 in [facility] Cardiac and Pulmonary Rehab  Education need identified 10/10/23       Education: Flexibility, Balance, Mind/Body Relaxation: - Group verbal and visual presentation with interactive activity on the components of exercise prescription. Introduces F.I.T.T principle from ACSM for exercise prescriptions. Reviews F.I.T.T. principles of flexibility and balance exercise training including progression. Also discusses the mind body connection.  Reviews various relaxation techniques to help reduce and manage stress (i.e. Deep breathing, progressive muscle relaxation, and visualization). Balance handout provided to take home. Written material given at graduation. Flowsheet Row Pulmonary Rehab from 11/03/2021 in [facility] Cardiac and Pulmonary Rehab  Date 10/27/21  Educator [facility]  Instruction Review Code 1- Verbalizes Understanding       Activity Barriers & Risk Stratification:  Activity Barriers & Cardiac Risk Stratification - 10/10/23 1555       Activity Barriers & Cardiac Risk Stratification   Activity Barriers Joint Problems;Balance Concerns;Deconditioning;Shortness of  Breath;Muscular Weakness    Cardiac Risk Stratification Low  6 Minute Walk:  6 Minute Walk     Row Name 10/10/23 1552         6 Minute Walk   Phase Initial     Distance 615 feet     Walk Time 5.54 minutes     # of Rest Breaks 1     MPH 1.2     METS 1.1     RPE 13     Perceived Dyspnea  1     VO2 Peak 3.76     Symptoms No     Resting HR 58 bpm     Resting BP 106/52     Resting Oxygen Saturation  96 %     Exercise Oxygen Saturation  during 6 min walk 88 %     Max Ex. HR 68 bpm     Max Ex. BP 126/60     2 Minute Post BP 108/48       Interval HR   1 Minute HR 64     2 Minute HR 66     3 Minute HR 67     4 Minute HR 68     5 Minute HR 68     6 Minute HR 68     2 Minute Post HR 58     Interval Heart Rate? Yes       Interval Oxygen   Interval Oxygen? Yes     Baseline Oxygen Saturation % 96 %     1 Minute Oxygen Saturation % 92 %     1 Minute Liters of Oxygen 4 L     2 Minute Oxygen Saturation % 89 %     2 Minute Liters of Oxygen 4 L     3 Minute Oxygen Saturation % 91 %     3 Minute Liters of Oxygen 4 L     4 Minute Oxygen Saturation % 89 %     4 Minute Liters of Oxygen 4 L     5 Minute Oxygen Saturation % 88 %     5 Minute Liters of Oxygen 4 L     6 Minute Oxygen Saturation % 91 %     6 Minute Liters of Oxygen 4 L     2 Minute Post Oxygen Saturation % 97 %     2 Minute Post Liters of Oxygen 3 L             Oxygen Initial Assessment:  Oxygen Initial Assessment - 10/04/23 1110       Home Oxygen   Home Oxygen Device Home Concentrator;E-Tanks    Sleep Oxygen Prescription Continuous    Liters per minute 3    Home Exercise Oxygen Prescription Continuous    Liters per minute 3    Home Resting Oxygen Prescription Continuous    Liters per minute 3    Compliance with Home Oxygen Use Yes      Intervention   Short Term Goals To learn and exhibit compliance with exercise, home and travel O2 prescription;To learn and understand importance of  monitoring SPO2 with pulse oximeter and demonstrate accurate use of the pulse oximeter.;To learn and understand importance of maintaining oxygen saturations>88%;To learn and demonstrate proper pursed lip breathing techniques or other breathing techniques. ;To learn and demonstrate proper use of respiratory medications    Long  Term Goals Exhibits compliance with exercise, home  and travel O2 prescription;Verbalizes importance of monitoring SPO2 with pulse oximeter and return demonstration;Maintenance of O2 saturations>88%;Exhibits proper  breathing techniques, such as pursed lip breathing or other method taught during program session;Compliance with respiratory medication;Demonstrates proper use of MDI's             Oxygen Re-Evaluation:  Oxygen Re-Evaluation     Row Name 10/16/23 1128 10/18/23 1110           Program Oxygen Prescription   Program Oxygen Prescription Continuous;E-Tanks Continuous;E-Tanks      Liters per minute 3 3        Home Oxygen   Home Oxygen Device Home Concentrator;E-Tanks Home Concentrator;E-Tanks      Sleep Oxygen Prescription Continuous Continuous      Liters per minute 3 3      Home Exercise Oxygen Prescription Continuous Continuous      Liters per minute 3 3      Home Resting Oxygen Prescription Continuous Continuous      Liters per minute 3 3      Compliance with Home Oxygen Use Yes Yes        Goals/Expected Outcomes   Short Term Goals To learn and demonstrate proper pursed lip breathing techniques or other breathing techniques.  To learn and demonstrate proper pursed lip breathing techniques or other breathing techniques.       Long  Term Goals Exhibits proper breathing techniques, such as pursed lip breathing or other method taught during program session Exhibits proper breathing techniques, such as pursed lip breathing or other method taught during program session      Comments Reviewed PLB technique with pt.  Talked about how it works and it's  importance in maintaining their exercise saturations. Informed patient how to perform the Pursed Lipped breathing technique. Told patient to Inhale through the nose and out the mouth with pursed lips to keep their airways open, help oxygenate them better, practice when at rest or doing strenuous activity. Patient Verbalizes understanding of technique and will work on and be reiterated during [name].      Goals/Expected Outcomes Short: Become more profiecient at using PLB. Long: Become independent at using PLB. Short: use PLB with exertion. Long: use PLB on exertion proficiently and independently.               Oxygen Discharge (Final Oxygen Re-Evaluation):  Oxygen Re-Evaluation - 10/18/23 1110       Program Oxygen Prescription   Program Oxygen Prescription Continuous;E-Tanks    Liters per minute 3      Home Oxygen   Home Oxygen Device Home Concentrator;E-Tanks    Sleep Oxygen Prescription Continuous    Liters per minute 3    Home Exercise Oxygen Prescription Continuous    Liters per minute 3    Home Resting Oxygen Prescription Continuous    Liters per minute 3    Compliance with Home Oxygen Use Yes      Goals/Expected Outcomes   Short Term Goals To learn and demonstrate proper pursed lip breathing techniques or other breathing techniques.     Long  Term Goals Exhibits proper breathing techniques, such as pursed lip breathing or other method taught during program session    Comments Informed patient how to perform the Pursed Lipped breathing technique. Told patient to Inhale through the nose and out the mouth with pursed lips to keep their airways open, help oxygenate them better, practice when at rest or doing strenuous activity. Patient Verbalizes understanding of technique and will work on and be reiterated during [name].    Goals/Expected Outcomes Short: use PLB with exertion. Long:  use PLB on exertion proficiently and independently.             Initial Exercise  Prescription:  Initial Exercise Prescription - 10/10/23 1500       Date of Initial Exercise RX and Referring Provider   Date 10/10/23    Referring Provider Dr. Mliss Sax      Oxygen   Oxygen Continuous    Liters 3L    Maintain Oxygen Saturation 88% or higher      Treadmill   MPH 1.2    Grade 0    Minutes 15    METs 1.1      NuStep   Level 1    SPM 80    Minutes 15    METs 1.1      T5 Nustep   Level 1    SPM 80    Minutes 15    METs 1.1      Prescription Details   Duration Progress to 30 minutes of continuous aerobic without signs/symptoms of physical distress      Intensity   THRR 40-80% of Max Heartrate 94-130    Ratings of Perceived Exertion 11-13    Perceived Dyspnea 0-4      Progression   Progression Continue to progress workloads to maintain intensity without signs/symptoms of physical distress.      Resistance Training   Training Prescription Yes    Weight 3 lb    Reps 10-15             Perform Capillary Blood Glucose checks as needed.  Exercise Prescription Changes:   Exercise Prescription Changes     Row Name 10/10/23 1500 11/13/23 1000 11/29/23 1600         Response to Exercise   Blood Pressure (Admit) 106/52 122/60 116/58     Blood Pressure (Exercise) 126/60 128/68 --     Blood Pressure (Exit) 108/48 118/60 118/60     Heart Rate (Admit) 58 bpm 66 bpm 74 bpm     Heart Rate (Exercise) 68 bpm 87 bpm 74 bpm     Heart Rate (Exit) 58 bpm 76 bpm 65 bpm     Oxygen Saturation (Admit) 96 % 91 % 91 %     Oxygen Saturation (Exercise) 88 % 90 % 91 %     Oxygen Saturation (Exit) 97 % 99 % 98 %     Rating of Perceived Exertion (Exercise) 13 12 13      Perceived Dyspnea (Exercise) 1 3 1      Symptoms none none none     Comments results first 2 weeks of exercise --     Duration Progress to 30 minutes of  aerobic without signs/symptoms of physical distress Progress to 30 minutes of  aerobic without signs/symptoms of physical distress  Progress to 30 minutes of  aerobic without signs/symptoms of physical distress     Intensity THRR New THRR New THRR unchanged       Progression   Progression Continue to progress workloads to maintain intensity without signs/symptoms of physical distress. Continue to progress workloads to maintain intensity without signs/symptoms of physical distress. Continue to progress workloads to maintain intensity without signs/symptoms of physical distress.     Average METs 1.1 2.11 1.92       Resistance Training   Training Prescription -- Yes Yes     Weight -- 3 lb 3 lb     Reps -- 10-15 10-15       Interval Training  Interval Training -- No No       Oxygen   Oxygen -- Continuous Continuous     Liters -- 3L 3       Treadmill   MPH -- 1.2 1.1     Grade -- 0 0     Minutes -- 15 15     METs -- 1.92 1.84       NuStep   Level -- 2 --     Minutes -- 15 --     METs -- 2.4 --       T5 Nustep   Level -- 1 1     Minutes -- 15 15       Biostep-RELP   Level -- -- 1     Minutes -- -- 15     METs -- -- 2       Oxygen   Maintain Oxygen Saturation -- 88% or higher 88% or higher              Exercise Comments:   Exercise Comments     Row Name 10/16/23 1126           Exercise Comments First full day of exercise!  Patient was oriented to gym and equipment including functions, settings, policies, and procedures.  Patient's individual exercise prescription and treatment plan were reviewed.  All starting workloads were established based on the results of the 6 minute walk test done at initial orientation visit.  The plan for exercise progression was also introduced and progression will be customized based on patient's performance and goals.                Exercise Goals and Review:   Exercise Goals     Row Name 10/10/23 1600             Exercise Goals   Increase Physical Activity Yes       Intervention Provide advice, education, support and counseling about physical  activity/exercise needs.;Develop an individualized exercise prescription for aerobic and resistive training based on initial evaluation findings, risk stratification, comorbidities and participant's personal goals.       Expected Outcomes Short Term: Attend rehab on a regular basis to increase amount of physical activity.;Long Term: Add in home exercise to make exercise part of routine and to increase amount of physical activity.;Long Term: Exercising regularly at least 3-5 days a week.       Increase Strength and Stamina Yes       Intervention Provide advice, education, support and counseling about physical activity/exercise needs.;Develop an individualized exercise prescription for aerobic and resistive training based on initial evaluation findings, risk stratification, comorbidities and participant's personal goals.       Expected Outcomes Short Term: Increase workloads from initial exercise prescription for resistance, speed, and METs.;Short Term: Perform resistance training exercises routinely during rehab and add in resistance training at home;Long Term: Improve cardiorespiratory fitness, muscular endurance and strength as measured by increased METs and functional capacity ( )       Able to understand and use rate of perceived exertion (RPE) scale Yes       Intervention Provide education and explanation on how to use RPE scale       Expected Outcomes Short Term: Able to use RPE daily in rehab to express subjective intensity level;Long Term:  Able to use RPE to guide intensity level when exercising independently       Able to understand and use Dyspnea scale Yes  Intervention Provide education and explanation on how to use Dyspnea scale       Expected Outcomes Short Term: Able to use Dyspnea scale daily in rehab to express subjective sense of shortness of breath during exertion;Long Term: Able to use Dyspnea scale to guide intensity level when exercising independently       Knowledge and  understanding of Target Heart Rate Range (THRR) Yes       Intervention Provide education and explanation of THRR including how the numbers were predicted and where they are located for reference       Expected Outcomes Short Term: Able to state/look up THRR;Short Term: Able to use daily as guideline for intensity in rehab;Long Term: Able to use THRR to govern intensity when exercising independently       Able to check pulse independently Yes       Intervention Provide education and demonstration on how to check pulse in carotid and radial arteries.;Review the importance of being able to check your own pulse for safety during independent exercise       Expected Outcomes Short Term: Able to explain why pulse checking is important during independent exercise;Long Term: Able to check pulse independently and accurately       Understanding of Exercise Prescription Yes       Intervention Provide education, explanation, and written materials on patient's individual exercise prescription       Expected Outcomes Short Term: Able to explain program exercise prescription;Long Term: Able to explain home exercise prescription to exercise independently                Exercise Goals Re-Evaluation :  Exercise Goals Re-Evaluation     Row Name 10/16/23 1127 11/13/23 1006 11/29/23 1602 12/13/23 1011       Exercise Goal Re-Evaluation   Exercise Goals Review Able to understand and use rate of perceived exertion (RPE) scale;Knowledge and understanding of Target Heart Rate Range (THRR);Able to understand and use Dyspnea scale;Understanding of Exercise Prescription Increase Physical Activity;Increase Strength and Stamina;Understanding of Exercise Prescription Increase Physical Activity;Increase Strength and Stamina;Understanding of Exercise Prescription Increase Physical Activity;Increase Strength and Stamina;Understanding of Exercise Prescription    Comments Reviewed RPE and dyspnea scale, THR and program  prescription with pt today.  Pt voiced understanding and was given a copy of goals to take home. Darius Norris is off to a great start in the program. He has been able to increase his level on the T4 nustep to level 2. He has alos been able to maintain his workload on the treadmill and the T5 nustep. We will continue to monitor his progress in the program. Darius Norris only attended one full session of rehab since the last review. He came for a second session but had to leave after first rounds due to feeling sick and having low blood pressure. He has continued to do well on the treadmill at a speed of 1.1 mph with no incline. He also has worked at level 1 on the T5 nustep and began using the biostep at level 1. We will continue to monitor his progress in the program. Darius Norris has not attended rehab during this review. His last date that he attended a session was 12/05. We will try to reach out to determine his status.    Expected Outcomes Short: Use RPE daily to regulate intensity. Long: Follow program prescription in THR. Short: Continue to follow current exercise prescription. Long: Continue exercise to improve strength and stamina. Short: Return to rehab when appropriate.  Long: Continue exercise to improve strength and stamina. Short: Return to rehab when appropriate. Long: Continue exercise to improve strength and stamina.             Discharge Exercise Prescription (Final Exercise Prescription Changes):  Exercise Prescription Changes - 11/29/23 1600       Response to Exercise   Blood Pressure (Admit) 116/58    Blood Pressure (Exit) 118/60    Heart Rate (Admit) 74 bpm    Heart Rate (Exercise) 74 bpm    Heart Rate (Exit) 65 bpm    Oxygen Saturation (Admit) 91 %    Oxygen Saturation (Exercise) 91 %    Oxygen Saturation (Exit) 98 %    Rating of Perceived Exertion (Exercise) 13    Perceived Dyspnea (Exercise) 1    Symptoms none    Duration Progress to 30 minutes of  aerobic without signs/symptoms of physical  distress    Intensity THRR unchanged      Progression   Progression Continue to progress workloads to maintain intensity without signs/symptoms of physical distress.    Average METs 1.92      Resistance Training   Training Prescription Yes    Weight 3 lb    Reps 10-15      Interval Training   Interval Training No      Oxygen   Oxygen Continuous    Liters 3      Treadmill   MPH 1.1    Grade 0    Minutes 15    METs 1.84      T5 Nustep   Level 1    Minutes 15      Biostep-RELP   Level 1    Minutes 15    METs 2      Oxygen   Maintain Oxygen Saturation 88% or higher             Nutrition:  Target Goals: Understanding of nutrition guidelines, daily intake of sodium 1500mg , cholesterol 200mg , calories 30% from fat and 7% or less from saturated fats, daily to have 5 or more servings of fruits and vegetables.  Education: All About Nutrition: -Group instruction provided by verbal, written material, interactive activities, discussions, models, and posters to present general guidelines for heart healthy nutrition including fat, fiber, MyPlate, the role of sodium in heart healthy nutrition, utilization of the nutrition label, and utilization of this knowledge for meal planning. Follow up email sent as well. Written material given at graduation. Flowsheet Row Pulmonary Rehab from 11/22/2023 in [facility] Cardiac and Pulmonary Rehab  Date 10/25/23  Educator JG  Instruction Review Code 1- Verbalizes Understanding       Biometrics:  Pre Biometrics - 10/10/23 1600       Pre Biometrics   Height 5' 5.6" (1.666 m)    Weight 185 lb 11.2 oz (84.2 kg)    Waist Circumference 43 inches    Hip Circumference 44.5 inches    Waist to Hip Ratio 0.97 %    BMI (Calculated) 30.35    Single Leg Stand 0 seconds              Nutrition Therapy Plan and Nutrition Goals:   Nutrition Assessments:  MEDIFICTS Score Key: >=70 Need to make dietary changes  40-70 Heart Healthy  Diet <= 40 Therapeutic Level Cholesterol Diet  Flowsheet Row Pulmonary Rehab from 10/10/2023 in [facility] Cardiac and Pulmonary Rehab  Picture Your Plate Total Score on Admission 58      Picture Your Plate  Scores: <40 Unhealthy dietary pattern with much room for improvement. 41-50 Dietary pattern unlikely to meet recommendations for good health and room for improvement. 51-60 More healthful dietary pattern, with some room for improvement.  >60 Healthy dietary pattern, although there may be some specific behaviors that could be improved.   Nutrition Goals Re-Evaluation:  Nutrition Goals Re-Evaluation     Row Name 10/18/23 1112             Goals   Current Weight 183 lb (83 kg)       Comment Patient was informed on why it is important to maintain a balanced diet when dealing with Respiratory issues. Explained that it takes a lot of energy to breath and when they are short of breath often they will need to have a good diet to help keep up with the calories they are expending for breathing.       Expected Outcome Short: Choose and plan snacks accordingly to patients caloric intake to improve breathing. Long: Maintain a diet independently that meets their caloric intake to aid in daily shortness of breath.                Nutrition Goals Discharge (Final Nutrition Goals Re-Evaluation):  Nutrition Goals Re-Evaluation - 10/18/23 1112       Goals   Current Weight 183 lb (83 kg)    Comment Patient was informed on why it is important to maintain a balanced diet when dealing with Respiratory issues. Explained that it takes a lot of energy to breath and when they are short of breath often they will need to have a good diet to help keep up with the calories they are expending for breathing.    Expected Outcome Short: Choose and plan snacks accordingly to patients caloric intake to improve breathing. Long: Maintain a diet independently that meets their caloric intake to aid in daily shortness of  breath.             Psychosocial: Target Goals: Acknowledge presence or absence of significant depression and/or stress, maximize coping skills, provide positive support system. Participant is able to verbalize types and ability to use techniques and skills needed for reducing stress and depression.   Education: Stress, Anxiety, and Depression - Group verbal and visual presentation to define topics covered.  Reviews how body is impacted by stress, anxiety, and depression.  Also discusses healthy ways to reduce stress and to treat/manage anxiety and depression.  Written material given at graduation. Flowsheet Row Pulmonary Rehab from 11/03/2021 in [facility] Cardiac and Pulmonary Rehab  Date 07/28/21  Educator [facility]  Instruction Review Code 1- [employer] Understanding       Education: Sleep Hygiene -Provides group verbal and written instruction about how sleep can affect your health.  Define sleep hygiene, discuss sleep cycles and impact of sleep habits. Review good sleep hygiene tips.    Initial Review & Psychosocial Screening:  Initial Psych Review & Screening - 10/04/23 1111       Initial Review   Current issues with History of Depression;Current Psychotropic Meds;Current Sleep Concerns      Family Dynamics   Good Support System? Yes   wife, older sister   Concerns Recent loss of significant other   youngest sister passed away 6 months a go,  nephew died 3 months ago.     Barriers   Psychosocial barriers to participate in program There are no identifiable barriers or psychosocial needs.      Screening Interventions  Interventions Encouraged to exercise;To provide support and resources with identified psychosocial needs;Provide feedback about the scores to participant    Expected Outcomes Short Term goal: Utilizing psychosocial counselor, staff and physician to assist with identification of specific Stressors or current issues interfering with healing process. Setting desired goal  for each stressor or current issue identified.;Long Term Goal: Stressors or current issues are controlled or eliminated.;Short Term goal: Identification and review with participant of any Quality of Life or Depression concerns found by scoring the questionnaire.;Long Term goal: The participant improves quality of Life and PHQ9 Scores as seen by post scores and/or verbalization of changes             Quality of Life Scores:  Scores of 19 and below usually indicate a poorer quality of life in these areas.  A difference of  2-3 points is a clinically meaningful difference.  A difference of 2-3 points in the total score of the Quality of Life Index has been associated with significant improvement in overall quality of life, self-image, physical symptoms, and general health in studies assessing change in quality of life.  PHQ-9: Review Flowsheet  More data exists      10/10/2023 11/29/2022 07/18/2022 06/19/2022 11/17/2021  Depression screen PHQ 2/9  Decreased Interest 1 1 1 2 1   Down, Depressed, Hopeless 0 0 0 1 0  PHQ - 2 Score 1 1 1 3 1   Altered sleeping 2 3 3 2 2   Tired, decreased energy 1 2 2 2 1   Change in appetite 0 0 0 0 1  Feeling bad or failure about yourself  0 1 0 0 0  Trouble concentrating 0 0 0 0 0  Moving slowly or fidgety/restless 0 0 0 0 0  Suicidal thoughts 0 0 0 0 0  PHQ-9 Score 4 7 6 7 5   Difficult doing work/chores Somewhat difficult Somewhat difficult Somewhat difficult Not difficult at all Somewhat difficult   Interpretation of Total Score  Total Score Depression Severity:  1-4 = Minimal depression, 5-9 = Mild depression, 10-14 = Moderate depression, 15-19 = Moderately severe depression, 20-27 = Severe depression   Psychosocial Evaluation and Intervention:  Psychosocial Evaluation - 10/04/23 1131       Psychosocial Evaluation & Interventions   Interventions Encouraged to exercise with the program and follow exercise prescription    Comments Darius Norris has no barriers to  attending the program. He has a history of depression and is taking meds that have his symptoms well controlled.  He is having trouble sleeping. He has to wake up to relieve the stuffiness  from the use of oxygen in his nose. Advised him to talk to his doctor about some humidity at night for his oxygen or look for saline gel for his nostrils to help keep them moist.  . He stated he had some gel and will try it again.  Darius Norris had 2 family members pass away in the last year. One was his younger sister and the other a nephew.   He stated he was doing okay fter their deaths.  He lives with his wife and she and his older sister are his support people.  His wife continues to workand he  experessed he thought it may be hard for her.  He is ready to return to the program.             Psychosocial Re-Evaluation:  Psychosocial Re-Evaluation     Row Name 10/18/23 1113  Psychosocial Re-Evaluation   Current issues with None Identified       Comments Patient reports no issues with their current mental states, sleep, stress, depression or anxiety. Will follow up with patient in a few weeks for any changes.       Expected Outcomes Short: Continue to exercise regularly to support mental health and notify staff of any changes. Long: maintain mental health and well being through teaching of rehab or prescribed medications independently.       Interventions Encouraged to attend Pulmonary Rehabilitation for the exercise       Continue Psychosocial Services  Follow up required by staff                Psychosocial Discharge (Final Psychosocial Re-Evaluation):  Psychosocial Re-Evaluation - 10/18/23 1113       Psychosocial Re-Evaluation   Current issues with None Identified    Comments Patient reports no issues with their current mental states, sleep, stress, depression or anxiety. Will follow up with patient in a few weeks for any changes.    Expected Outcomes Short: Continue to exercise  regularly to support mental health and notify staff of any changes. Long: maintain mental health and well being through teaching of rehab or prescribed medications independently.    Interventions Encouraged to attend Pulmonary Rehabilitation for the exercise    Continue Psychosocial Services  Follow up required by staff             Education: Education Goals: Education classes will be provided on a weekly basis, covering required topics. Participant will state understanding/return demonstration of topics presented.  Learning Barriers/Preferences:   General Pulmonary Education Topics:  Infection Prevention: - Provides verbal and written material to individual with discussion of infection control including proper hand washing and proper equipment cleaning during exercise session. Flowsheet Row Pulmonary Rehab from 11/22/2023 in [facility] Cardiac and Pulmonary Rehab  Date 10/10/23  Educator [facility]  Instruction Review Code 1- Verbalizes Understanding       Falls Prevention: - Provides verbal and written material to individual with discussion of falls prevention and safety. Flowsheet Row Pulmonary Rehab from 11/22/2023 in [facility] Cardiac and Pulmonary Rehab  Date 10/10/23  Educator [facility]  Instruction Review Code 1- Verbalizes Understanding       Chronic Lung Disease Review: - Group verbal instruction with posters, models, PowerPoint presentations and videos,  to review new updates, new respiratory medications, new advancements in procedures and treatments. Providing information on websites and "800" numbers for continued self-education. Includes information about supplement oxygen, available portable oxygen systems, continuous and intermittent flow rates, oxygen safety, concentrators, and Medicare reimbursement for oxygen. Explanation of Pulmonary Drugs, including class, frequency, complications, importance of spacers, rinsing mouth after steroid MDI's, and proper cleaning methods for nebulizers.  Review of basic lung anatomy and physiology related to function, structure, and complications of lung disease. Review of risk factors. Discussion about methods for diagnosing sleep apnea and types of masks and machines for OSA. Includes a review of the use of types of environmental controls: home humidity, furnaces, filters, dust mite/pet prevention, HEPA vacuums. Discussion about weather changes, air quality and the benefits of nasal washing. Instruction on Warning signs, infection symptoms, calling MD promptly, preventive modes, and value of vaccinations. Review of effective airway clearance, coughing and/or vibration techniques. Emphasizing that all should Create an Action Plan. Written material given at graduation. Flowsheet Row Pulmonary Rehab from 11/22/2023 in [facility] Cardiac and Pulmonary Rehab  Education need identified 10/10/23  AED/CPR: - Group verbal and written instruction with the use of models to demonstrate the basic use of the AED with the basic ABC's of resuscitation. Flowsheet Row Pulmonary Rehab from 02/26/2019 in [facility] Cardiac and Pulmonary Rehab  Date 01/01/19  Educator [facility]  Instruction Review Code 1- Verbalizes Understanding        Anatomy and Cardiac Procedures: - Group verbal and visual presentation and models provide information about basic cardiac anatomy and function. Reviews the testing methods done to diagnose heart disease and the outcomes of the test results. Describes the treatment choices: Medical Management, Angioplasty, or Coronary Bypass Surgery for treating various heart conditions including Myocardial Infarction, Angina, Valve Disease, and Cardiac Arrhythmias.  Written material given at graduation. Flowsheet Row Pulmonary Rehab from 11/22/2023 in [facility] Cardiac and Pulmonary Rehab  Date 11/08/23  Educator SB  Instruction Review Code 1- Verbalizes Understanding       Medication Safety: - Group verbal and visual instruction to review commonly prescribed  medications for heart and lung disease. Reviews the medication, class of the drug, and side effects. Includes the steps to properly store meds and maintain the prescription regimen.  Written material given at graduation. Flowsheet Row Pulmonary Rehab from 11/03/2021 in [facility] Cardiac and Pulmonary Rehab  Date 09/01/21  Educator [facility]  Instruction Review Code 1- Verbalizes Understanding       Other: -Provides group and verbal instruction on various topics (see comments)   Knowledge Questionnaire Score:  Knowledge Questionnaire Score - 10/10/23 1555       Knowledge Questionnaire Score   Pre Score 15/18              Core Components/Risk Factors/Patient Goals at Admission:  Personal Goals and Risk Factors at Admission - 10/04/23 1120       Core Components/Risk Factors/Patient Goals on Admission    Weight Management Yes    Intervention Weight Management: Develop a combined nutrition and exercise program designed to reach desired caloric intake, while maintaining appropriate intake of nutrient and fiber, sodium and fats, and appropriate energy expenditure required for the weight goal.;Weight Management: Provide education and appropriate resources to help participant work on and attain dietary goals.    Admit Weight 179 lb (81.2 kg)    Goal Weight: Long Term 150 lb (68 kg)    Expected Outcomes Short Term: Continue to assess and modify interventions until short term weight is achieved;Long Term: Adherence to nutrition and physical activity/exercise program aimed toward attainment of established weight goal;Weight Loss: Understanding of general recommendations for a balanced deficit meal plan, which promotes 1-2 lb weight loss per week and includes a negative energy balance of [phone] kcal/d    Improve shortness of breath with ADL's Yes    Intervention Provide education, individualized exercise plan and daily activity instruction to help decrease symptoms of SOB with activities of daily  living.    Expected Outcomes Short Term: Improve cardiorespiratory fitness to achieve a reduction of symptoms when performing ADLs;Long Term: Be able to perform more ADLs without symptoms or delay the onset of symptoms    Increase knowledge of respiratory medications and ability to use respiratory devices properly  Yes    Intervention Provide education and demonstration as needed of appropriate use of medications, inhalers, and oxygen therapy.    Expected Outcomes Short Term: Achieves understanding of medications use. Understands that oxygen is a medication prescribed by physician. Demonstrates appropriate use of inhaler and oxygen therapy.;Long Term: Maintain appropriate use of medications, inhalers, and oxygen therapy.  Diabetes Yes    Intervention Provide education about signs/symptoms and action to take for hypo/hyperglycemia.;Provide education about proper nutrition, including hydration, and aerobic/resistive exercise prescription along with prescribed medications to achieve blood glucose in normal ranges: Fasting glucose 65-99 mg/dL    Expected Outcomes Short Term: Participant verbalizes understanding of the signs/symptoms and immediate care of hyper/hypoglycemia, proper foot care and importance of medication, aerobic/resistive exercise and nutrition plan for blood glucose control.;Long Term: Attainment of HbA1C < 7%.    Hypertension Yes    Intervention Provide education on lifestyle modifcations including regular physical activity/exercise, weight management, moderate sodium restriction and increased consumption of fresh fruit, vegetables, and low fat dairy, alcohol moderation, and smoking cessation.;Monitor prescription use compliance.    Expected Outcomes Short Term: Continued assessment and intervention until BP is < 140/53mm HG in hypertensive participants. < 130/69mm HG in hypertensive participants with diabetes, heart failure or chronic kidney disease.;Long Term: Maintenance of blood  pressure at goal levels.    Lipids Yes    Intervention Provide education and support for participant on nutrition & aerobic/resistive exercise along with prescribed medications to achieve LDL 70mg , HDL >40mg .    Expected Outcomes Short Term: Participant states understanding of desired cholesterol values and is compliant with medications prescribed. Participant is following exercise prescription and nutrition guidelines.;Long Term: Cholesterol controlled with medications as prescribed, with individualized exercise RX and with personalized nutrition plan. Value goals: LDL < 70mg , HDL > 40 mg.             Education:Diabetes - Individual verbal and written instruction to review signs/symptoms of diabetes, desired ranges of glucose level fasting, after meals and with exercise. Acknowledge that pre and post exercise glucose checks will be done for 3 sessions at entry of program. Flowsheet Row Pulmonary Rehab from 07/27/2022 in [facility] Cardiac and Pulmonary Rehab  Date 05/08/22  Educator [facility]  Instruction Review Code 1- Verbalizes Understanding       Know Your Numbers and Heart Failure: - Group verbal and visual instruction to discuss disease risk factors for cardiac and pulmonary disease and treatment options.  Reviews associated critical values for Overweight/Obesity, Hypertension, Cholesterol, and Diabetes.  Discusses basics of heart failure: signs/symptoms and treatments.  Introduces Heart Failure Zone chart for action plan for heart failure.  Written material given at graduation. Flowsheet Row Pulmonary Rehab from 11/22/2023 in [facility] Cardiac and Pulmonary Rehab  Date 11/22/23  Educator [facility]  Instruction Review Code 1- Verbalizes Understanding       Core Components/Risk Factors/Patient Goals Review:   Goals and Risk Factor Review     Row Name 10/18/23 1111             Core Components/Risk Factors/Patient Goals Review   Personal Goals Review Improve shortness of breath with ADL's        Review Spoke to patient about their shortness of breath and what they can do to improve. Patient has been informed of breathing techniques when starting the program. Patient is informed to tell staff if they have had any med changes and that certain meds they are taking or not taking can be causing shortness of breath.       Expected Outcomes Short: Attend [name] regularly to improve shortness of breath with ADL's. Long: maintain independence with ADL's                Core Components/Risk Factors/Patient Goals at Discharge (Final Review):   Goals and Risk Factor Review - 10/18/23 1111  Core Components/Risk Factors/Patient Goals Review   Personal Goals Review Improve shortness of breath with ADL's    Review Spoke to patient about their shortness of breath and what they can do to improve. Patient has been informed of breathing techniques when starting the program. Patient is informed to tell staff if they have had any med changes and that certain meds they are taking or not taking can be causing shortness of breath.    Expected Outcomes Short: Attend [name] regularly to improve shortness of breath with ADL's. Long: maintain independence with ADL's             ITP Comments:  ITP Comments     Row Name 10/04/23 1124 10/04/23 1138 10/10/23 1551 10/16/23 1126 10/17/23 0853   ITP Comments Virtual orientation call completed today. he has an appointment on Date: 10/10/2023  for EP eval and gym Orientation.  Documentation of diagnosis can be found in [place]  CE Date: 07/24/2023   08/23/2023 . Virtual orientation call completed today. he has an appointment on Date: 10/10/2023  for EP eval and gym Orientation.  Documentation of diagnosis can be found in [facility]  CE Date: 07/24/2023   08/23/2023 .     Darius Norris was hospitalized for Covid in Sept. He is still gaining back his energy after discharge. Completed and gym orientation. Initial ITP created and sent for review to Dr. Jinny Sanders, Medical  Director. First full day of exercise!  Patient was oriented to gym and equipment including functions, settings, policies, and procedures.  Patient's individual exercise prescription and treatment plan were reviewed.  All starting workloads were established based on the results of the 6 minute walk test done at initial orientation visit.  The plan for exercise progression was also introduced and progression will be customized based on patient's performance and goals. 30 Day review completed. Medical Director ITP review done, changes made as directed, and signed approval by Medical Director.    new to program    Row Name 11/07/23 0931 11/22/23 1144 12/05/23 1146 12/17/23 1426     ITP Comments 30 Day review completed. Medical Director ITP review done, changes made as directed, and signed approval by Medical Director. BP lower than usual today, had diarrhea on Tuesday. Advised to rehydrate.  BP up after water.  Using sit down only machine today.   Left after 1st round . 30 Day review completed. Medical Director ITP review done, changes made as directed, and signed approval by Medical Director. Called to check on pt, he has not been to pulmonary rehab since 12/5. Pt states that he would like to discharge from rehab as he is just unable to continue at this time.             Comments: Discharge ITP

## 2023-12-17 NOTE — Telephone Encounter (Signed)
Called to check on pt, he has not been to pulmonary rehab since 12/5. Pt states that he would like to discharge from rehab as he is just unable to continue at this time.

## 2023-12-17 NOTE — Progress Notes (Unsigned)
Discharge Summary:  Darius Norris  (DOB:  14-Mar-1951)  [name] discharged early from pulmonary rehab due to unable to make it to class. He completed 10/36 sessions.   6 Minute Walk     Row Name 10/10/23 1552         6 Minute Walk   Phase Initial     Distance 615 feet     Walk Time 5.54 minutes     # of Rest Breaks 1     MPH 1.2     METS 1.1     RPE 13     Perceived Dyspnea  1     VO2 Peak 3.76     Symptoms No     Resting HR 58 bpm     Resting BP 106/52     Resting Oxygen Saturation  96 %     Exercise Oxygen Saturation  during 6 min walk 88 %     Max Ex. HR 68 bpm     Max Ex. BP 126/60     2 Minute Post BP 108/48       Interval HR   1 Minute HR 64     2 Minute HR 66     3 Minute HR 67     4 Minute HR 68     5 Minute HR 68     6 Minute HR 68     2 Minute Post HR 58     Interval Heart Rate? Yes       Interval Oxygen   Interval Oxygen? Yes     Baseline Oxygen Saturation % 96 %     1 Minute Oxygen Saturation % 92 %     1 Minute Liters of Oxygen 4 L     2 Minute Oxygen Saturation % 89 %     2 Minute Liters of Oxygen 4 L     3 Minute Oxygen Saturation % 91 %     3 Minute Liters of Oxygen 4 L     4 Minute Oxygen Saturation % 89 %     4 Minute Liters of Oxygen 4 L     5 Minute Oxygen Saturation % 88 %     5 Minute Liters of Oxygen 4 L     6 Minute Oxygen Saturation % 91 %     6 Minute Liters of Oxygen 4 L     2 Minute Post Oxygen Saturation % 97 %     2 Minute Post Liters of Oxygen 3 L

## 2024-01-12 ENCOUNTER — Emergency Department: Payer: No Typology Code available for payment source

## 2024-01-12 ENCOUNTER — Other Ambulatory Visit: Payer: Self-pay

## 2024-01-12 ENCOUNTER — Inpatient Hospital Stay
Admission: EM | Admit: 2024-01-12 | Discharge: 2024-01-15 | DRG: 190 | Disposition: A | Discharge status: Home / Self Care | Payer: No Typology Code available for payment source | Attending: Osteopathic Medicine | Admitting: Osteopathic Medicine

## 2024-01-12 DIAGNOSIS — Z7984 Long term (current) use of oral hypoglycemic drugs: Secondary | ICD-10-CM | POA: Diagnosis not present

## 2024-01-12 DIAGNOSIS — J449 Chronic obstructive pulmonary disease, unspecified: Secondary | ICD-10-CM | POA: Diagnosis present

## 2024-01-12 DIAGNOSIS — J9601 Acute respiratory failure with hypoxia: Secondary | ICD-10-CM

## 2024-01-12 DIAGNOSIS — E871 Hypo-osmolality and hyponatremia: Secondary | ICD-10-CM | POA: Diagnosis present

## 2024-01-12 DIAGNOSIS — Z7982 Long term (current) use of aspirin: Secondary | ICD-10-CM | POA: Diagnosis not present

## 2024-01-12 DIAGNOSIS — Z79899 Other long term (current) drug therapy: Secondary | ICD-10-CM

## 2024-01-12 DIAGNOSIS — Z888 Allergy status to other drugs, medicaments and biological substances status: Secondary | ICD-10-CM

## 2024-01-12 DIAGNOSIS — I2510 Atherosclerotic heart disease of native coronary artery without angina pectoris: Secondary | ICD-10-CM | POA: Diagnosis present

## 2024-01-12 DIAGNOSIS — N179 Acute kidney failure, unspecified: Secondary | ICD-10-CM | POA: Diagnosis present

## 2024-01-12 DIAGNOSIS — Z6827 Body mass index (BMI) 27.0-27.9, adult: Secondary | ICD-10-CM | POA: Diagnosis not present

## 2024-01-12 DIAGNOSIS — Z8673 Personal history of transient ischemic attack (TIA), and cerebral infarction without residual deficits: Secondary | ICD-10-CM

## 2024-01-12 DIAGNOSIS — I480 Paroxysmal atrial fibrillation: Secondary | ICD-10-CM | POA: Diagnosis present

## 2024-01-12 DIAGNOSIS — I252 Old myocardial infarction: Secondary | ICD-10-CM

## 2024-01-12 DIAGNOSIS — E440 Moderate protein-calorie malnutrition: Secondary | ICD-10-CM | POA: Diagnosis present

## 2024-01-12 DIAGNOSIS — I110 Hypertensive heart disease with heart failure: Secondary | ICD-10-CM | POA: Diagnosis present

## 2024-01-12 DIAGNOSIS — J441 Chronic obstructive pulmonary disease with (acute) exacerbation: Secondary | ICD-10-CM | POA: Diagnosis present

## 2024-01-12 DIAGNOSIS — E663 Overweight: Secondary | ICD-10-CM | POA: Diagnosis present

## 2024-01-12 DIAGNOSIS — Z9981 Dependence on supplemental oxygen: Secondary | ICD-10-CM | POA: Diagnosis not present

## 2024-01-12 DIAGNOSIS — Z7901 Long term (current) use of anticoagulants: Secondary | ICD-10-CM

## 2024-01-12 DIAGNOSIS — E785 Hyperlipidemia, unspecified: Secondary | ICD-10-CM | POA: Diagnosis present

## 2024-01-12 DIAGNOSIS — J9621 Acute and chronic respiratory failure with hypoxia: Secondary | ICD-10-CM | POA: Diagnosis present

## 2024-01-12 DIAGNOSIS — I5032 Chronic diastolic (congestive) heart failure: Secondary | ICD-10-CM | POA: Diagnosis present

## 2024-01-12 DIAGNOSIS — Z885 Allergy status to narcotic agent status: Secondary | ICD-10-CM | POA: Diagnosis not present

## 2024-01-12 DIAGNOSIS — T380X5A Adverse effect of glucocorticoids and synthetic analogues, initial encounter: Secondary | ICD-10-CM | POA: Diagnosis present

## 2024-01-12 DIAGNOSIS — Z1152 Encounter for screening for COVID-19: Secondary | ICD-10-CM | POA: Diagnosis not present

## 2024-01-12 DIAGNOSIS — Z9049 Acquired absence of other specified parts of digestive tract: Secondary | ICD-10-CM

## 2024-01-12 DIAGNOSIS — Z7902 Long term (current) use of antithrombotics/antiplatelets: Secondary | ICD-10-CM

## 2024-01-12 DIAGNOSIS — Z8249 Family history of ischemic heart disease and other diseases of the circulatory system: Secondary | ICD-10-CM

## 2024-01-12 DIAGNOSIS — R000 Tachycardia, unspecified: Secondary | ICD-10-CM | POA: Diagnosis present

## 2024-01-12 DIAGNOSIS — E860 Dehydration: Secondary | ICD-10-CM | POA: Diagnosis present

## 2024-01-12 LAB — RESP PANEL BY RT-PCR (RSV, FLU A&B, COVID)  RVPGX2
Influenza A by PCR: NEGATIVE
Influenza B by PCR: NEGATIVE
Resp Syncytial Virus by PCR: NEGATIVE
SARS Coronavirus 2 by RT PCR: NEGATIVE

## 2024-01-12 LAB — CBC
HCT: 43.3 % (ref 39.0–52.0)
Hemoglobin: 13.3 g/dL (ref 13.0–17.0)
MCH: 27.9 pg (ref 26.0–34.0)
MCHC: 30.7 g/dL (ref 30.0–36.0)
MCV: 90.8 fL (ref 80.0–100.0)
Platelets: 273 10*3/uL (ref 150–400)
RBC: 4.77 MIL/uL (ref 4.22–5.81)
RDW: 19.2 % — ABNORMAL HIGH (ref 11.5–15.5)
WBC: 15.4 10*3/uL — ABNORMAL HIGH (ref 4.0–10.5)
nRBC: 0 % (ref 0.0–0.2)

## 2024-01-12 LAB — COMPREHENSIVE METABOLIC PANEL
ALT: 17 U/L (ref 0–44)
AST: 16 U/L (ref 15–41)
Albumin: 3.8 g/dL (ref 3.5–5.0)
Alkaline Phosphatase: 62 U/L (ref 38–126)
Anion gap: 12 (ref 5–15)
BUN: 31 mg/dL — ABNORMAL HIGH (ref 8–23)
CO2: 28 mmol/L (ref 22–32)
Calcium: 9.1 mg/dL (ref 8.9–10.3)
Chloride: 96 mmol/L — ABNORMAL LOW (ref 98–111)
Creatinine, Ser: 1.26 mg/dL — ABNORMAL HIGH (ref 0.61–1.24)
GFR, Estimated: >60 mL/min (ref >60)
Glucose, Bld: 127 mg/dL — ABNORMAL HIGH (ref 70–99)
Potassium: 4.6 mmol/L (ref 3.5–5.1)
Sodium: 136 mmol/L (ref 135–145)
Total Bilirubin: 0.5 mg/dL (ref 0.0–1.2)
Total Protein: 6.9 g/dL (ref 6.5–8.1)

## 2024-01-12 LAB — TROPONIN I (HIGH SENSITIVITY)
Troponin I (High Sensitivity): 18 ng/L — ABNORMAL HIGH (ref <18)
Troponin I (High Sensitivity): 33 ng/L — ABNORMAL HIGH (ref <18)

## 2024-01-12 LAB — BRAIN NATRIURETIC PEPTIDE: B Natriuretic Peptide: 107.2 pg/mL — ABNORMAL HIGH (ref 0.0–100.0)

## 2024-01-12 MED ORDER — ALBUTEROL SULFATE (2.5 MG/3ML) 0.083% IN NEBU
5.0000 mg | INHALATION_SOLUTION | Freq: Once | RESPIRATORY_TRACT | Status: AC
  Administered 2024-01-12: 5 mg via RESPIRATORY_TRACT
  Filled 2024-01-12: qty 6

## 2024-01-12 MED ORDER — METOPROLOL SUCCINATE ER 50 MG PO TB24
200.0000 mg | ORAL_TABLET | Freq: Every day | ORAL | Status: DC
  Administered 2024-01-13: 200 mg via ORAL
  Filled 2024-01-12: qty 4

## 2024-01-12 MED ORDER — LOSARTAN POTASSIUM 50 MG PO TABS
25.0000 mg | ORAL_TABLET | Freq: Every day | ORAL | Status: DC
  Administered 2024-01-12 – 2024-01-13 (×2): 25 mg via ORAL
  Filled 2024-01-12 (×2): qty 1

## 2024-01-12 MED ORDER — SIMETHICONE 80 MG PO CHEW
80.0000 mg | CHEWABLE_TABLET | Freq: Four times a day (QID) | ORAL | Status: DC | PRN
  Filled 2024-01-12: qty 1

## 2024-01-12 MED ORDER — IPRATROPIUM-ALBUTEROL 0.5-2.5 (3) MG/3ML IN SOLN: 3.0000 mL | RESPIRATORY_TRACT | Status: DC | PRN

## 2024-01-12 MED ORDER — DILTIAZEM HCL 25 MG/5ML IV SOLN
10.0000 mg | Freq: Once | INTRAVENOUS | Status: AC
  Administered 2024-01-12: 10 mg via INTRAVENOUS
  Filled 2024-01-12: qty 5

## 2024-01-12 MED ORDER — METOPROLOL SUCCINATE ER 50 MG PO TB24
100.0000 mg | ORAL_TABLET | Freq: Once | ORAL | Status: AC
  Administered 2024-01-12: 100 mg via ORAL
  Filled 2024-01-12: qty 2

## 2024-01-12 MED ORDER — ISOSORBIDE MONONITRATE ER 60 MG PO TB24
60.0000 mg | ORAL_TABLET | Freq: Every day | ORAL | Status: DC
  Administered 2024-01-12 – 2024-01-14 (×3): 60 mg via ORAL
  Filled 2024-01-12 (×3): qty 1

## 2024-01-12 MED ORDER — IPRATROPIUM-ALBUTEROL 0.5-2.5 (3) MG/3ML IN SOLN
3.0000 mL | Freq: Four times a day (QID) | RESPIRATORY_TRACT | Status: DC
  Administered 2024-01-12 – 2024-01-13 (×2): 3 mL via RESPIRATORY_TRACT
  Filled 2024-01-12 (×5): qty 3

## 2024-01-12 MED ORDER — LIDOCAINE 5 % EX PTCH: 1.0000 | MEDICATED_PATCH | Freq: Every day | CUTANEOUS | Status: DC | PRN

## 2024-01-12 MED ORDER — OXYCODONE HCL 5 MG PO TABS
5.0000 mg | ORAL_TABLET | ORAL | Status: DC | PRN
  Administered 2024-01-12: 2.5 mg via ORAL
  Administered 2024-01-12 – 2024-01-13 (×5): 5 mg via ORAL
  Administered 2024-01-14: 10 mg via ORAL
  Administered 2024-01-14 (×2): 5 mg via ORAL
  Filled 2024-01-12 (×3): qty 1
  Filled 2024-01-12: qty 2
  Filled 2024-01-12 (×2): qty 1
  Filled 2024-01-12: qty 2
  Filled 2024-01-12 (×3): qty 1

## 2024-01-12 MED ORDER — ALBUTEROL SULFATE HFA 108 (90 BASE) MCG/ACT IN AERS: 1.0000 | INHALATION_SPRAY | Freq: Four times a day (QID) | RESPIRATORY_TRACT | Status: DC | PRN

## 2024-01-12 MED ORDER — TRAZODONE HCL 100 MG PO TABS
100.0000 mg | ORAL_TABLET | Freq: Every evening | ORAL | Status: DC | PRN
  Administered 2024-01-13 (×2): 100 mg via ORAL
  Filled 2024-01-12 (×3): qty 1

## 2024-01-12 MED ORDER — SENNA 8.6 MG PO TABS: 1.0000 | ORAL_TABLET | Freq: Every day | ORAL | Status: DC | PRN

## 2024-01-12 MED ORDER — LORATADINE 10 MG PO TABS: 5.0000 mg | ORAL_TABLET | Freq: Every day | ORAL | Status: DC | PRN

## 2024-01-12 MED ORDER — DILTIAZEM HCL ER COATED BEADS 300 MG PO CP24
300.0000 mg | ORAL_CAPSULE | Freq: Every day | ORAL | Status: DC
  Filled 2024-01-12: qty 1

## 2024-01-12 MED ORDER — MAGNESIUM OXIDE 400 MG PO TABS
800.0000 mg | ORAL_TABLET | Freq: Every day | ORAL | Status: DC
  Administered 2024-01-13 – 2024-01-15 (×3): 800 mg via ORAL
  Filled 2024-01-12 (×6): qty 2

## 2024-01-12 MED ORDER — METOPROLOL TARTRATE 50 MG PO TABS
50.0000 mg | ORAL_TABLET | Freq: Once | ORAL | Status: AC
  Administered 2024-01-12: 50 mg via ORAL
  Filled 2024-01-12: qty 1

## 2024-01-12 MED ORDER — EMPAGLIFLOZIN 10 MG PO TABS
10.0000 mg | ORAL_TABLET | Freq: Every day | ORAL | Status: DC
  Administered 2024-01-13 – 2024-01-15 (×3): 10 mg via ORAL
  Filled 2024-01-12 (×3): qty 1

## 2024-01-12 MED ORDER — GUAIFENESIN ER 600 MG PO TB12
1200.0000 mg | ORAL_TABLET | Freq: Two times a day (BID) | ORAL | Status: DC
  Administered 2024-01-12: 1200 mg via ORAL
  Administered 2024-01-12: 600 mg via ORAL
  Administered 2024-01-13 – 2024-01-15 (×5): 1200 mg via ORAL
  Filled 2024-01-12 (×7): qty 2

## 2024-01-12 MED ORDER — BUSPIRONE HCL 10 MG PO TABS
5.0000 mg | ORAL_TABLET | Freq: Two times a day (BID) | ORAL | Status: DC
  Administered 2024-01-12 – 2024-01-15 (×7): 5 mg via ORAL
  Filled 2024-01-12 (×8): qty 1

## 2024-01-12 MED ORDER — METHYLPREDNISOLONE SODIUM SUCC 125 MG IJ SOLR
125.0000 mg | Freq: Once | INTRAMUSCULAR | Status: AC
  Administered 2024-01-12: 125 mg via INTRAVENOUS
  Filled 2024-01-12: qty 2

## 2024-01-12 MED ORDER — FOLIC ACID 1 MG PO TABS
2.0000 mg | ORAL_TABLET | ORAL | Status: DC
  Administered 2024-01-12 – 2024-01-15 (×3): 2 mg via ORAL
  Filled 2024-01-12 (×4): qty 2

## 2024-01-12 MED ORDER — PREDNISONE 20 MG PO TABS
40.0000 mg | ORAL_TABLET | Freq: Every day | ORAL | Status: DC
  Administered 2024-01-13 – 2024-01-15 (×3): 40 mg via ORAL
  Filled 2024-01-12 (×3): qty 2

## 2024-01-12 MED ORDER — DABIGATRAN ETEXILATE MESYLATE 150 MG PO CAPS
150.0000 mg | ORAL_CAPSULE | Freq: Two times a day (BID) | ORAL | Status: DC
  Administered 2024-01-12 – 2024-01-15 (×6): 150 mg via ORAL
  Filled 2024-01-12 (×6): qty 1

## 2024-01-12 MED ORDER — GABAPENTIN 300 MG PO CAPS
600.0000 mg | ORAL_CAPSULE | Freq: Three times a day (TID) | ORAL | Status: DC
  Administered 2024-01-12: 300 mg via ORAL
  Administered 2024-01-12 – 2024-01-15 (×6): 600 mg via ORAL
  Filled 2024-01-12 (×8): qty 2

## 2024-01-12 MED ORDER — ASPIRIN 81 MG PO TBEC
81.0000 mg | DELAYED_RELEASE_TABLET | Freq: Every day | ORAL | Status: DC
  Administered 2024-01-12 – 2024-01-15 (×4): 81 mg via ORAL
  Filled 2024-01-12 (×5): qty 1

## 2024-01-12 MED ORDER — DIPHENHYDRAMINE HCL 25 MG PO CAPS
50.0000 mg | ORAL_CAPSULE | Freq: Once | ORAL | Status: DC
  Filled 2024-01-12: qty 2

## 2024-01-12 MED ORDER — MOMETASONE FURO-FORMOTEROL FUM 100-5 MCG/ACT IN AERO
2.0000 | INHALATION_SPRAY | Freq: Two times a day (BID) | RESPIRATORY_TRACT | Status: DC
  Administered 2024-01-12 – 2024-01-15 (×6): 2 via RESPIRATORY_TRACT
  Filled 2024-01-12: qty 8.8

## 2024-01-12 MED ORDER — MELATONIN 5 MG PO TABS
5.0000 mg | ORAL_TABLET | Freq: Every day | ORAL | Status: DC
  Administered 2024-01-12: 5 mg via ORAL
  Filled 2024-01-12 (×2): qty 1

## 2024-01-12 MED ORDER — ATORVASTATIN CALCIUM 20 MG PO TABS
40.0000 mg | ORAL_TABLET | Freq: Every day | ORAL | Status: DC
  Administered 2024-01-12 – 2024-01-14 (×3): 40 mg via ORAL
  Filled 2024-01-12 (×3): qty 2

## 2024-01-12 MED ORDER — METHOTREXATE SODIUM 2.5 MG PO TABS
12.5000 mg | ORAL_TABLET | ORAL | Status: DC
  Administered 2024-01-13: 12.5 mg via ORAL
  Filled 2024-01-12: qty 5

## 2024-01-12 NOTE — ED Notes (Signed)
Pt requesting meal tray. Pt advised that his RN has called the cafeteria for tray. Offered pt a sandwich tray but pt refused. Pt states "I need a little more than a sandwich" Told pt that we would give them a few more minutes that RN has called, Pt states "somebody needs to get their head out of their ass"

## 2024-01-12 NOTE — Hospital Course (Addendum)
Hospital course / significant events:   HPI: Darius Norris is a 73 y.o. 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  According to the patient since awakening this morning around 7:00 he has been experiencing severe shortness of breath along with 5/10 central chest pain described more as a tightness sensation.  Patient states occasional cough, no fever.  Patient arrives via EMS with a nonrebreather.  Per EMS patient was satting 84% on 4 L. Pt states he did not take his lopressor this morning, later states he thinks this was stopped. He denies fever/chills, denies fatigue, reports cough, denies unusual pain other than chest tightness.   In ED: CXR No active disease, COPD with bullous changes in the bilateral upper/mid lung zones. BNP about baseline 107. Troponin 18 --> 33. WBC 15.4. Requiring up to 10L HFNC, Received SoluMedrol 125, Albuterol neb. Tachycardia Afib, received Cardizem IV. Admitted to hospitalist service for COPD exacerbation.      Consultants:  none  Procedures/Surgeries: none      ASSESSMENT & PLAN:   COPD exacerbation  Acute on Chronic hypoxic respiratory failure  Chronic hypoxic respiratory failure on 4L [place] O2 at baseline  DuoNeb q6h scheduled, q2h prn  S/p SoluMedrol in ED, plan start prednisone tomorrow  Guaifenesin expectorant  Pt not clear about home inhalers, will ask pharmacy to verify   Chest pain, resolved Mild elevation troponin likely d/t demand/supply mismatch d/t hypoxia/COPD Trend troponin Troponin/EKG prn chest pain Cardiac monitoring   AKI Likely prerenal / dehydration Mild, GFR still >60, Cr 1.26 vs baseline 0.7-0.8 Monitor BMP  Atrial fibrillation  Continue dabigatran 150 mg daily stop diltiazem, based on last cardiology note he  is not on this  Pt reports he is no longer taking metoprolol but based on cardiology notes it was changed from tartrate to succinate, will continue metoprolol succinate 200 mg daily  Cardiac monitoring   CAD status post MI 30+ years ago per patient no stents NSTEMI 2001 HTN HLD Continue on home ASA, atorvastatin 40 mg daily, furosemide 40 mg daily, (isosorbide mononitrate 40 mg daily, losartan 25 mg daily, metoprolol XL 200 mg daily Pt states no longer taking beta blocker or isosorbide but most recent cardiology notes these medications are advised, and diltiazem dc, will base rx now on recent cardiology notes   Chronic HFpEF continue on home lasix 40 mg daily  Strict in/out     overweight based on BMI: Body mass index is 27.44 kg/m.  Underweight - under 18  overweight - 25 to 29 obese - 30 or more Class 1 obesity: BMI of 30.0 to 34 Class 2 obesity: BMI of 35.0 to 39 Class 3 obesity: BMI of 40.0 to 49 Super Morbid Obesity: BMI 50-59 Super-super Morbid Obesity: BMI 60+ Significantly low or high BMI is associated with higher medical risk.  Weight management advised as adjunct to other disease management and risk reduction treatments    DVT prophylaxis: pradaxa IV fluids: no continuous IV fluids  Nutrition: cardiac diet  Central lines / invasive devices: none  Code Status: FULL CODE he states he would want CPR/intubation, would not want to be on life support more than 5 days  ACP documentation reviewed: none on file in [facility]  TOC needs: TBD Barriers to dispo / significant pending items: high O2  requirement

## 2024-01-12 NOTE — ED Triage Notes (Signed)
Pt bib EMS from home c/o chest pain & shortness of breath that started when he woke up around 6:00 this morning. Pt reports his oxygen sats were 84% on 4 liters which he wears 4 liters of oxygen all the time. EMS put NRB on pt and sats came up to 98%. Pt reports he just finished antibiotic for pneumonia.

## 2024-01-12 NOTE — ED Provider Notes (Signed)
[facility] Provider Note    Event Date/Time   First MD Initiated Contact with Patient 01/12/24 [phone]     (approximate)  History   Chief Complaint: Chest Pain  HPI  Darius Norris is a 73 y.o. 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  According to the patient since awakening this morning around 7:00 he has been experiencing severe shortness of breath along with 5/10 central chest pain described more as a tightness sensation.  Patient states occasional cough, no fever.  Patient arrives via EMS with a nonrebreather.  Per EMS patient was satting 84% on 4 L.  Currently satting in upper 90s on a nonrebreather.  He does report he was recently diagnosed with pneumonia and completed a course of antibiotics.  Physical Exam   Triage Vital Signs: ED Triage Vitals  Encounter Vitals Group     BP 01/12/24 0927 100/69     Systolic BP Percentile --      Diastolic BP Percentile --      Pulse --      Resp 01/12/24 0927 18     Temp 01/12/24 0927 (!) 97.4 F (36.3 C)     Temp Source 01/12/24 0927 Oral     SpO2 01/12/24 0923 98 %     Weight 01/12/24 0930 170 lb (77.1 kg)     Height 01/12/24 0930 5\' 6"  (1.676 m)     Head Circumference --      Peak Flow --      Pain Score 01/12/24 0930 4     Pain Loc --      Pain Education --      Exclude from Growth Chart --     Most recent vital signs: Vitals:   01/12/24 0923 01/12/24 0927  BP:  100/69  Resp:  18  Temp:  (!) 97.4 F (36.3 C)  SpO2: 98% 100%    General: Awake, no distress.  CV:  Good peripheral perfusion.  Irregular rhythm rate around 130 bpm. Resp:  Mild tachypnea speaking in 2-3 word sentences.  Diminished breath sounds bilaterally with mild expiratory wheeze. Abd:  No distention.  Soft,  nontender.  No rebound or guarding. Other:  Very minimal pedal edema bilaterally.   ED Results / Procedures / Treatments   EKG  EKG viewed and interpreted by myself shows atrial fibrillation at 118 bpm with a narrow QRS, normal axis, normal intervals.  Patient does have inferolateral T wave inversions with mild ST depression.   RADIOLOGY  I have reviewed and interpreted chest x-ray images.  No obvious consolidation on my evaluation. Radiology is read the x-ray as COPD with bullous changes.   MEDICATIONS ORDERED IN ED: Medications  diltiazem (CARDIZEM) injection 10 mg (has no administration in time range)  albuterol (PROVENTIL) (2.5 MG/3ML) 0.083% nebulizer solution 5 mg (has no administration in time range)  methylPREDNISolone sodium succinate (SOLU-MEDROL) 125 mg/2 mL injection 125 mg (has no administration in time range)     IMPRESSION / MDM / ASSESSMENT AND PLAN / ED COURSE  I reviewed the triage vital signs and the nursing notes.  Patient's presentation is most consistent with acute presentation with potential threat to life or bodily function.  Patient presents to the emergency department for chest pain and shortness of breath.  History of significant COPD wears 4 L of oxygen chronically.  Patient has diminished breath sounds bilaterally with mild expiratory wheezes.  Will treat with albuterol 5 mg nebulizer, Solu-Medrol 125 mg, we will obtain labs including cardiac enzymes x 2, obtain a chest x-ray as well as a respiratory panel and continue to closely monitor.  Patient agreeable plan of care and workup.  Patient's workup in the emergency department shows mild leukocytosis of 15,000 otherwise reassuring CBC, chemistry shows no significant finding.  Patient's troponin is 18 and BNP unchanged from historical values at 107.  Patient's COVID/flu/RSV is negative.  Chest x-ray shows COPD with no other acute finding.  Patient is requiring 10 L nasal cannula increased from his baseline  of 4 L to keep sats over 90.  Highly suspect COPD exacerbation.  Will admit to the hospital service for ongoing management.  Patient agreeable to plan.  CRITICAL CARE Performed by: Minna Antis   Total critical care time: 30 minutes  Critical care time was exclusive of separately billable procedures and treating other patients.  Critical care was necessary to treat or prevent imminent or life-threatening deterioration.  Critical care was time spent personally by me on the following activities: development of treatment plan with patient and/or surrogate as well as nursing, discussions with consultants, evaluation of patient's response to treatment, examination of patient, obtaining history from patient or surrogate, ordering and performing treatments and interventions, ordering and review of laboratory studies, ordering and review of radiographic studies, pulse oximetry and re-evaluation of patient's condition.   FINAL CLINICAL IMPRESSION(S) / ED DIAGNOSES   Dyspnea COPD exacerbation Chest pain Increased oxygen requirement  Note:  This document was prepared using Dragon voice recognition software and may include unintentional dictation errors.   Minna Antis, MD 01/12/24 1112

## 2024-01-12 NOTE — ED Notes (Signed)
Pt remained at 100% O2 sat on 8L aerosol mask.  RT called to assess for high flow O2.

## 2024-01-12 NOTE — H&P (Signed)
HISTORY AND PHYSICAL    Darius Norris   UJW:[id number] DOB: October 23, 1951   Date of Service: 01/12/24 Requesting physician/APP from ED: Treatment Team:  Attending Provider: Sunnie Nielsen, DO  PCP: [facility]    Gaba bid Lasix daily No isosorbide \ No lopressor this morning  Not taking omeprazole  No tramadol Asmanex  Hospital course / significant events:   HPI: Darius Norris is a 73 y.o. 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  According to the patient since awakening this morning around 7:00 he has been experiencing severe shortness of breath along with 5/10 central chest pain described more as a tightness sensation.  Patient states occasional cough, no fever.  Patient arrives via EMS with a nonrebreather.  Per EMS patient was satting 84% on 4 L.   In ED: CXR No active disease, COPD with bullous changes in the bilateral upper/mid lung zones. BNP about baseline 107. Troponin 18 --> 33. WBC 15.4. Requiring up to 10L HFNC, Received SoluMedrol 125, Albuterol neb. Tachycardia Afib, received Cardizem IV. Admitted to hospitalist service for COPD exacerbation.      Consultants:  none  Procedures/Surgeries: none      ASSESSMENT & PLAN:   COPD exacerbation  Acute on Chronic hypoxic respiratory failure  Chronic hypoxic respiratory failure on 4L [place] O2 at baseline  DuoNeb q6h scheduled, q2h prn  S/p SoluMedrol in ED, plan start prednisone tomorrow  Guaifenesin expectorant  Pt not clear about home inhalers, will ask pharmacy to verify   Chest pain, resolved Mild elevation troponin likely d/t demand/supply mismatch d/t hypoxia/COPD Trend troponin Troponin/EKG prn chest pain Cardiac monitoring   AKI Likely prerenal / dehydration Mild,  GFR still >60, Cr 1.26 vs baseline 0.7-0.8 Monitor BMP  Atrial fibrillation  Continue dabigatran 150 mg daily stop diltiazem, based on last cardiology note he is not on this  Pt reports he is no longer taking metoprolol but based on cardiology notes it was changed from tartrate to succinate, will continue metoprolol succinate 200 mg daily  Cardiac monitoring   CAD status post MI 30+ years ago per patient no stents NSTEMI 2001 HTN HLD Continue on home ASA, atorvastatin 40 mg daily, furosemide 40 mg daily, (isosorbide mononitrate 40 mg daily, losartan 25 mg daily, metoprolol XL 200 mg daily Pt states no longer taking beta blocker or isosorbide but most recent cardiology notes these medications are advised, and diltiazem dc, will base rx now on recent cardiology notes   Chronic HFpEF continue on home lasix 40 mg daily  Strict in/out     overweight based on BMI: Body mass index is 27.44 kg/m.  Underweight - under 18  overweight - 25 to 29 obese - 30 or more Class 1 obesity: BMI of 30.0 to 34 Class 2 obesity: BMI of 35.0 to 39 Class 3 obesity: BMI of 40.0 to 49 Super Morbid Obesity: BMI 50-59 Super-super Morbid Obesity: BMI 60+ Significantly low or high BMI is associated with higher medical risk.  Weight management advised as adjunct to other disease management and risk reduction treatments    DVT prophylaxis: pradaxa IV fluids: no continuous IV fluids  Nutrition: cardiac diet  Central lines / invasive devices: none  Code Status: FULL CODE he states he would want CPR/intubation, would not want to  be on life support more than 5 days  ACP documentation reviewed: none on file in [facility]  TOC needs: TBD Barriers to dispo / significant pending items: high O2 requirement                      Review of Systems:  Review of Systems  Constitutional:  Positive for malaise/fatigue. Negative for chills and fever.  Respiratory:  Positive for cough, shortness of breath  and wheezing. Negative for hemoptysis and sputum production.   Cardiovascular:  Positive for chest pain (tightness). Negative for palpitations, orthopnea and leg swelling.  Gastrointestinal:  Negative for abdominal pain, heartburn, nausea and vomiting.  Musculoskeletal:  Negative for falls.  Neurological:  Negative for dizziness and focal weakness.       has a past medical history of A-fib (HCC), CAD (coronary artery disease), Colitis, COPD (chronic obstructive pulmonary disease) (HCC), Diabetes mellitus without complication (HCC), Emphysema lung (HCC), GERD (gastroesophageal reflux disease), Hypertension, and MI (myocardial infarction) (HCC) (1999). (Not in an outpatient encounter)   Allergies  Allergen Reactions   Zantac [Ranitidine Hcl] Shortness Of Breath    Eruption, HIVES, SHORTNESS OF BREATH, Eruption, HIVES, SHORTNESS OF BREATH    Molnupiravir Swelling   Codeine Itching    Other reaction(s): PRURITIS   Erythromycin Hives    Other reaction(s): SWELLING (NON-SPECIFIC), HIVES, SWELLING (NON-SPECIFIC), HIVES   Morphine Itching   Tiotropium Bromide Monohydrate    Hydrocodone Rash    Other reaction(s): PRURITIS      family history includes CAD in his father and sister; Stroke in his brother and father. Past Surgical History:  Procedure Laterality Date   ABDOMINAL HERNIA REPAIR Bilateral    CHOLECYSTECTOMY     COLONOSCOPY WITH PROPOFOL N/A 02/01/2018   Procedure: COLONOSCOPY WITH PROPOFOL;  Surgeon: Wyline Mood, MD;  Location: [facility] ENDOSCOPY;  Service: Gastroenterology;  Laterality: N/A;   KNEE ARTHROSCOPY Bilateral    LEFT HEART CATH AND CORONARY ANGIOGRAPHY N/A 01/03/2023   Procedure: LEFT HEART CATH AND CORONARY ANGIOGRAPHY and possible pci and stent;  Surgeon: Marcina Millard, MD;  Location: [facility] INVASIVE CV LAB;  Service: Cardiovascular;  Laterality: N/A;          Objective Findings:  Vitals:   01/12/24 1430 01/12/24 1449 01/12/24 1500 01/12/24 1507  BP:  118/71  (!) 99/55   Pulse:   (!) 130 (!) 113  Resp:   20   Temp:  97.8 F (36.6 C)    TempSrc:  Oral    SpO2:   100% 100%  Weight:      Height:       No intake or output data in the 24 hours ending 01/12/24 1526 Filed Weights   01/12/24 0930  Weight: 77.1 kg    Examination:  Physical Exam Constitutional:      General: He is not in acute distress. Cardiovascular:     Rate and Rhythm: Tachycardia present. Rhythm irregular.  Pulmonary:     Effort: Pulmonary effort is normal. No tachypnea or accessory muscle usage.     Breath sounds: Decreased breath sounds and wheezing (scattered) present.  Musculoskeletal:     Right lower leg: No edema.     Left lower leg: No edema.  Skin:    General: Skin is warm and dry.  Neurological:     General: No focal deficit present.     Mental Status: He is alert and oriented to person, place, and time.  Psychiatric:  Mood and Affect: Mood normal.        Behavior: Behavior normal.          Scheduled Medications:   aspirin EC  81 mg Oral Daily   atorvastatin  40 mg Oral QHS   busPIRone  5 mg Oral BID   dabigatran  150 mg Oral BID   diphenhydrAMINE  50 mg Oral Once   [START ON 01/13/2024] empagliflozin  10 mg Oral Daily   folic acid  2 mg Oral Once per day on Monday Tuesday Wednesday Thursday Friday Saturday   gabapentin  600 mg Oral TID   guaiFENesin  1,200 mg Oral BID   ipratropium-albuterol  3 mL Nebulization Q6H   isosorbide mononitrate  60 mg Oral Daily   losartan  25 mg Oral Daily   [START ON 01/13/2024] magnesium oxide  800 mg Oral Daily   melatonin  5 mg Oral QHS   [START ON 01/13/2024] methotrexate  12.5 mg Oral Weekly   metoprolol succinate  100 mg Oral Daily   [START ON 01/13/2024] metoprolol succinate  200 mg Oral Daily   [START ON 01/13/2024] predniSONE  40 mg Oral Q breakfast    Continuous Infusions:   PRN Medications:  ipratropium-albuterol, lidocaine, loratadine, oxyCODONE, senna, simethicone,  traZODone  Antimicrobials:  Anti-infectives (From admission, onward)    None           Data Reviewed: I have personally reviewed following labs and imaging studies  CBC: Recent Labs  Lab 01/12/24 0933  WBC 15.4*  HGB 13.3  HCT 43.3  MCV 90.8  PLT 273   Basic Metabolic Panel: Recent Labs  Lab 01/12/24 0933  NA 136  K 4.6  CL 96*  CO2 28  GLUCOSE 127*  BUN 31*  CREATININE 1.26*  CALCIUM 9.1   GFR: Estimated Creatinine Clearance: 51.8 mL/min (A) (by C-G formula based on SCr of 1.26 mg/dL (H)). Liver Function Tests: Recent Labs  Lab 01/12/24 0933  AST 16  ALT 17  ALKPHOS 62  BILITOT 0.5  PROT 6.9  ALBUMIN 3.8   No results for input(s): "LIPASE", "AMYLASE" in the last 168 hours. No results for input(s): "AMMONIA" in the last 168 hours. Coagulation Profile: No results for input(s): "INR", "PROTIME" in the last 168 hours. Cardiac Enzymes: No results for input(s): "CKTOTAL", "CKMB", "CKMBINDEX", "TROPONINI" in the last 168 hours. BNP (last 3 results) No results for input(s): "PROBNP" in the last 8760 hours. HbA1C: No results for input(s): "HGBA1C" in the last 72 hours. CBG: No results for input(s): "GLUCAP" in the last 168 hours. Lipid Profile: No results for input(s): "CHOL", "HDL", "LDLCALC", "TRIG", "CHOLHDL", "LDLDIRECT" in the last 72 hours. Thyroid Function Tests: No results for input(s): "TSH", "T4TOTAL", "FREET4", "T3FREE", "THYROIDAB" in the last 72 hours. Anemia Panel: No results for input(s): "VITAMINB12", "FOLATE", "FERRITIN", "TIBC", "IRON", "RETICCTPCT" in the last 72 hours. Most Recent Urinalysis On File:     Component Value Date/Time   COLORURINE YELLOW (A) 08/17/2022 0254   APPEARANCEUR CLEAR (A) 08/17/2022 0254   APPEARANCEUR Clear 02/04/2013 1430   LABSPEC 1.011 08/17/2022 0254   LABSPEC 1.004 02/04/2013 1430   PHURINE 5.0 08/17/2022 0254   GLUCOSEU NEGATIVE 08/17/2022 0254   GLUCOSEU Negative 02/04/2013 1430   HGBUR NEGATIVE  08/17/2022 0254   BILIRUBINUR NEGATIVE 08/17/2022 0254   BILIRUBINUR Negative 02/04/2013 1430   KETONESUR NEGATIVE 08/17/2022 0254   PROTEINUR NEGATIVE 08/17/2022 0254   NITRITE NEGATIVE 08/17/2022 0254   LEUKOCYTESUR NEGATIVE 08/17/2022 0254  LEUKOCYTESUR Negative 02/04/2013 1430   Sepsis Labs: @LABRCNTIP (procalcitonin:4,lacticidven:4)  Recent Results (from the past 240 hours)  Resp panel by RT-PCR (RSV, Flu A&B, Covid) Anterior Nasal Swab     Status: None   Collection Time: 01/12/24  9:33 AM   Specimen: Anterior Nasal Swab  Result Value Ref Range Status   SARS Coronavirus 2 by RT PCR NEGATIVE NEGATIVE Final    Comment: (NOTE) SARS-CoV-2 target nucleic acids are NOT DETECTED.  The SARS-CoV-2 RNA is generally detectable in upper respiratory specimens during the acute phase of infection. The lowest concentration of SARS-CoV-2 viral copies this assay can detect is 138 copies/mL. A negative result does not preclude SARS-Cov-2 infection and should not be used as the sole basis for treatment or other patient management decisions. A negative result may occur with  improper specimen collection/handling, submission of specimen other than nasopharyngeal swab, presence of viral mutation(s) within the areas targeted by this assay, and inadequate number of viral copies(<138 copies/mL). A negative result must be combined with clinical observations, patient history, and epidemiological information. The expected result is Negative.  Fact Sheet for Patients:  BloggerCourse.com  Fact Sheet for Healthcare Providers:  SeriousBroker.it  This test is no t yet approved or cleared by the [place] FDA and  has been authorized for detection and/or diagnosis of SARS-CoV-2 by FDA under an Emergency Use Authorization (EUA). This EUA will remain  in effect (meaning this test can be used) for the duration of the COVID-19 declaration under Section  564(b)(1) of the Act, 21 U.S.C.section 360bbb-3(b)(1), unless the authorization is terminated  or revoked sooner.       Influenza A by PCR NEGATIVE NEGATIVE Final   Influenza B by PCR NEGATIVE NEGATIVE Final    Comment: (NOTE) The Xpert Xpress SARS-CoV-2/FLU/RSV plus assay is intended as an aid in the diagnosis of influenza from Nasopharyngeal swab specimens and should not be used as a sole basis for treatment. Nasal washings and aspirates are unacceptable for Xpert Xpress SARS-CoV-2/FLU/RSV testing.  Fact Sheet for Patients: BloggerCourse.com  Fact Sheet for Healthcare Providers: SeriousBroker.it  This test is not yet approved or cleared by the [place] FDA and has been authorized for detection and/or diagnosis of SARS-CoV-2 by FDA under an Emergency Use Authorization (EUA). This EUA will remain in effect (meaning this test can be used) for the duration of the COVID-19 declaration under Section 564(b)(1) of the Act, 21 U.S.C. section 360bbb-3(b)(1), unless the authorization is terminated or revoked.     Resp Syncytial Virus by PCR NEGATIVE NEGATIVE Final    Comment: (NOTE) Fact Sheet for Patients: BloggerCourse.com  Fact Sheet for Healthcare Providers: SeriousBroker.it  This test is not yet approved or cleared by the [place] FDA and has been authorized for detection and/or diagnosis of SARS-CoV-2 by FDA under an Emergency Use Authorization (EUA). This EUA will remain in effect (meaning this test can be used) for the duration of the COVID-19 declaration under Section 564(b)(1) of the Act, 21 U.S.C. section 360bbb-3(b)(1), unless the authorization is terminated or revoked.  Performed at [facility], [address] [place], [place] [address]          Radiology Studies: DG Chest Portable 1 View Result Date: 01/12/2024 CLINICAL DATA:  Shortness  of breath. EXAM: PORTABLE CHEST 1 VIEW COMPARISON:  08/23/2023. FINDINGS: Bilateral lungs appear hyperexpanded and hyperlucent with coarse bronchovascular markings, in keeping with COPD. There is paucity of markings in the bilateral upper/mid zones, compatible with bullous changes. Bilateral lungs  otherwise appear clear. No dense consolidation or lung collapse. Bilateral costophrenic angles are clear. Normal cardio-mediastinal silhouette. No acute osseous abnormalities. The soft tissues are within normal limits. IMPRESSION: *No active disease. COPD with bullous changes in the bilateral upper/mid lung zones. Electronically Signed   By: Jules Schick M.D.   On: 01/12/2024 10:32             LOS: 0 days       Sunnie Nielsen, DO [facility] 01/12/2024, 3:26 PM    Dictation software may have been used to generate the above note. Typos may occur and escape review in typed/dictated notes. Please contact Dr Lyn Hollingshead directly for clarity if needed.  Staff may message me via secure chat in Epic  but this may not receive an immediate response,  please page me for urgent matters!  If 7PM-7AM, please contact night coverage [email or web address]

## 2024-01-12 NOTE — ED Notes (Signed)
Pt called out, says family is bringing him an overnight bag and leaving it at the front desk. Pt apologized for losing his temper.

## 2024-01-12 NOTE — ED Notes (Signed)
Lactic and Blood cultures x1 sent to lab to hold.

## 2024-01-13 ENCOUNTER — Other Ambulatory Visit: Payer: Self-pay

## 2024-01-13 DIAGNOSIS — J441 Chronic obstructive pulmonary disease with (acute) exacerbation: Secondary | ICD-10-CM | POA: Diagnosis not present

## 2024-01-13 LAB — BASIC METABOLIC PANEL
Anion gap: 13 (ref 5–15)
BUN: 40 mg/dL — ABNORMAL HIGH (ref 8–23)
CO2: 27 mmol/L (ref 22–32)
Calcium: 9.4 mg/dL (ref 8.9–10.3)
Chloride: 92 mmol/L — ABNORMAL LOW (ref 98–111)
Creatinine, Ser: 1.34 mg/dL — ABNORMAL HIGH (ref 0.61–1.24)
GFR, Estimated: 56 mL/min — ABNORMAL LOW (ref >60)
Glucose, Bld: 167 mg/dL — ABNORMAL HIGH (ref 70–99)
Potassium: 4.2 mmol/L (ref 3.5–5.1)
Sodium: 132 mmol/L — ABNORMAL LOW (ref 135–145)

## 2024-01-13 LAB — CBC
HCT: 42.1 % (ref 39.0–52.0)
Hemoglobin: 13.2 g/dL (ref 13.0–17.0)
MCH: 28.1 pg (ref 26.0–34.0)
MCHC: 31.4 g/dL (ref 30.0–36.0)
MCV: 89.6 fL (ref 80.0–100.0)
Platelets: 261 10*3/uL (ref 150–400)
RBC: 4.7 MIL/uL (ref 4.22–5.81)
RDW: 18.9 % — ABNORMAL HIGH (ref 11.5–15.5)
WBC: 12.4 10*3/uL — ABNORMAL HIGH (ref 4.0–10.5)
nRBC: 0 % (ref 0.0–0.2)

## 2024-01-13 LAB — TROPONIN I (HIGH SENSITIVITY): Troponin I (High Sensitivity): 73 ng/L — ABNORMAL HIGH (ref <18)

## 2024-01-13 MED ORDER — LOSARTAN POTASSIUM 25 MG PO TABS
12.5000 mg | ORAL_TABLET | Freq: Every day | ORAL | Status: DC
  Filled 2024-01-13: qty 0.5

## 2024-01-13 MED ORDER — METOPROLOL SUCCINATE ER 50 MG PO TB24
100.0000 mg | ORAL_TABLET | Freq: Every day | ORAL | Status: DC
  Filled 2024-01-13: qty 2

## 2024-01-13 NOTE — Progress Notes (Signed)
PROGRESS NOTE    Darius Norris   ZOX:[id number] DOB: 02-Mar-1951  DOA: 01/12/2024 Date of Service: 01/13/24 which is hospital day 1  PCP: [facility] course / significant events:   HPI: Darius Norris is a 73 y.o. male with a past medical history of atrial fibrillation on anticoagulation, CAD status post MI 30+ years ago per patient no stents, COPD on 4 L chronically, diabetes, hypertension, presents to the emergency department 01/12/24 for chest pain and shortness of breath.  Since awakening morning around 07:00 SOB, chest pain described as tightness. Per EMS patient was satting 84% on 4 L, placed on NRB. He denies fever/chills, denies fatigue, reports cough, denies unusual pain other than chest tightness.   01/25: In ED: CXR No active disease, COPD with bullous changes in the bilateral upper/mid lung zones. BNP about baseline 107. Troponin 18 --> 33. WBC 15.4. Requiring up to 10L HFNC, Received SoluMedrol 125, Albuterol neb. Tachycardia Afib, received Cardizem IV. Admitted to hospitalist service for COPD exacerbation.  01/26: breathing improved but still on HFNC this morning, down to 4-6L this afternoon. Troponin up slightly but follows the expected curve, EKG remain stable from previous/old EKG and no chest pain.      Consultants:  none  Procedures/Surgeries: none      ASSESSMENT & PLAN:   COPD exacerbation  Acute on Chronic hypoxic respiratory failure  Chronic hypoxic respiratory failure on 4L [place] O2 at baseline  DuoNeb q6h scheduled, q2h prn  S/p SoluMedrol in ED, plan start prednisone tomorrow  Guaifenesin expectorant  Pt not clear about home inhalers, will ask pharmacy to verify   Chest pain, resolved Mild elevation troponin likely d/t demand/supply mismatch d/t hypoxia/COPD Trend troponin -  Troponin/EKG prn chest pain Cardiac monitoring   AKI Likely prerenal / dehydration Mild, GFR still >60, Cr 1.26 vs baseline 0.7-0.8 Monitor  BMP  Atrial fibrillation  Continue dabigatran 150 mg daily stop diltiazem, based on last cardiology note he is not on this  Pt reports he is no longer taking metoprolol but based on cardiology notes it was changed from tartrate to succinate, will continue metoprolol succinate 200 mg daily  Cardiac monitoring   CAD status post MI 30+ years ago per patient no stents NSTEMI 2001 HTN HLD on home ASA, atorvastatin 40 mg daily, furosemide 40 mg daily, isosorbide mononitrate 40 mg daily, losartan 25 mg daily, metoprolol XL 200 mg daily Continue ASA, atorvastatin 40 mg daily, furosemide 40 mg daily, isosorbide mononitrate 40 mg daily Losartan and metoprolol decreased today (losartan to 12.5 mg daily and metoprolol xl to 100 mg daily) d/t soft BP, rate is controlled Pt states no longer taking beta blocker or isosorbide but most recent cardiology notes these medications are advised, and diltiazem dc, will base rx now on recent cardiology notes   Chronic HFpEF continue on home lasix 40 mg daily  Strict in/out     overweight based on BMI: Body mass index is 27.44 kg/m.  Underweight - under 18  overweight - 25 to 29 obese - 30 or more Class 1 obesity: BMI of 30.0 to 34 Class 2 obesity: BMI of 35.0 to 39 Class 3 obesity: BMI of 40.0 to 49 Super Morbid Obesity: BMI 50-59 Super-super Morbid Obesity: BMI 60+ Significantly low or high BMI is associated with higher medical risk.  Weight management advised as adjunct to other disease management and risk reduction treatments    DVT prophylaxis: pradaxa IV fluids: no  continuous IV fluids  Nutrition: cardiac diet  Central lines / invasive devices: none  Code Status: FULL CODE he states he would want CPR/intubation, would not want to be on life support more than 5 days  ACP documentation reviewed: none on file in [facility]  TOC needs: TBD Barriers to dispo / significant pending items: high O2 requirement              Subjective /  Brief ROS:  Patient reports breathing a bit better today Denies CP No SOB at rest  Pain controlled.  Denies new weakness.  Tolerating diet.  Reports no concerns w/ urination/defecation.   Family Communication: none at this time     Objective Findings:  Vitals:   01/13/24 1130 01/13/24 1440 01/13/24 1444 01/13/24 1445  BP:  (!) 95/59    Pulse:  62    Resp:  13    Temp:   97.7 F (36.5 C)   TempSrc:   Oral   SpO2: 96% 100%  100%  Weight:      Height:        Intake/Output Summary (Last 24 hours) at 01/13/2024 1655 Last data filed at 01/13/2024 1244 Gross per 24 hour  Intake 100 ml  Output 1100 ml  Net -1000 ml   Filed Weights   01/12/24 0930  Weight: 77.1 kg    Examination:  Physical Exam Constitutional:      General: He is not in acute distress. Cardiovascular:     Rate and Rhythm: Normal rate and regular rhythm.  Pulmonary:     Effort: Pulmonary effort is normal.     Breath sounds: Decreased breath sounds present.  Musculoskeletal:     Right lower leg: No edema.     Left lower leg: No edema.  Neurological:     General: No focal deficit present.     Mental Status: He is alert and oriented to person, place, and time.  Psychiatric:        Mood and Affect: Mood normal.        Behavior: Behavior normal.          Scheduled Medications:   aspirin EC  81 mg Oral Daily   atorvastatin  40 mg Oral QHS   busPIRone  5 mg Oral BID   dabigatran  150 mg Oral BID   diphenhydrAMINE  50 mg Oral Once   empagliflozin  10 mg Oral Daily   folic acid  2 mg Oral Once per day on Monday Tuesday Wednesday Thursday Friday Saturday   gabapentin  600 mg Oral TID   guaiFENesin  1,200 mg Oral BID   ipratropium-albuterol  3 mL Nebulization Q6H   isosorbide mononitrate  60 mg Oral Daily   [START ON 01/14/2024] losartan  12.5 mg Oral Daily   magnesium oxide  800 mg Oral Daily   melatonin  5 mg Oral QHS   methotrexate  12.5 mg Oral Weekly   [START ON 01/14/2024] metoprolol  succinate  100 mg Oral Daily   mometasone-formoterol  2 puff Inhalation BID   predniSONE  40 mg Oral Q breakfast    Continuous Infusions:   PRN Medications:  ipratropium-albuterol, lidocaine, loratadine, oxyCODONE, senna, simethicone, traZODone  Antimicrobials from admission:  Anti-infectives (From admission, onward)    None           Data Reviewed:  I have personally reviewed the following...  CBC: Recent Labs  Lab 01/12/24 0933 01/13/24 0930  WBC 15.4* 12.4*  HGB 13.3 13.2  HCT 43.3 42.1  MCV 90.8 89.6  PLT 273 261   Basic Metabolic Panel: Recent Labs  Lab 01/12/24 0933 01/13/24 0930  NA 136 132*  K 4.6 4.2  CL 96* 92*  CO2 28 27  GLUCOSE 127* 167*  BUN 31* 40*  CREATININE 1.26* 1.34*  CALCIUM 9.1 9.4   GFR: Estimated Creatinine Clearance: 48.7 mL/min (A) (by C-G formula based on SCr of 1.34 mg/dL (H)). Liver Function Tests: Recent Labs  Lab 01/12/24 0933  AST 16  ALT 17  ALKPHOS 62  BILITOT 0.5  PROT 6.9  ALBUMIN 3.8   No results for input(s): "LIPASE", "AMYLASE" in the last 168 hours. No results for input(s): "AMMONIA" in the last 168 hours. Coagulation Profile: No results for input(s): "INR", "PROTIME" in the last 168 hours. Cardiac Enzymes: No results for input(s): "CKTOTAL", "CKMB", "CKMBINDEX", "TROPONINI" in the last 168 hours. BNP (last 3 results) No results for input(s): "PROBNP" in the last 8760 hours. HbA1C: No results for input(s): "HGBA1C" in the last 72 hours. CBG: No results for input(s): "GLUCAP" in the last 168 hours. Lipid Profile: No results for input(s): "CHOL", "HDL", "LDLCALC", "TRIG", "CHOLHDL", "LDLDIRECT" in the last 72 hours. Thyroid Function Tests: No results for input(s): "TSH", "T4TOTAL", "FREET4", "T3FREE", "THYROIDAB" in the last 72 hours. Anemia Panel: No results for input(s): "VITAMINB12", "FOLATE", "FERRITIN", "TIBC", "IRON", "RETICCTPCT" in the last 72 hours. Most Recent Urinalysis On File:      Component Value Date/Time   COLORURINE YELLOW (A) 08/17/2022 0254   APPEARANCEUR CLEAR (A) 08/17/2022 0254   APPEARANCEUR Clear 02/04/2013 1430   LABSPEC 1.011 08/17/2022 0254   LABSPEC 1.004 02/04/2013 1430   PHURINE 5.0 08/17/2022 0254   GLUCOSEU NEGATIVE 08/17/2022 0254   GLUCOSEU Negative 02/04/2013 1430   HGBUR NEGATIVE 08/17/2022 0254   BILIRUBINUR NEGATIVE 08/17/2022 0254   BILIRUBINUR Negative 02/04/2013 1430   KETONESUR NEGATIVE 08/17/2022 0254   PROTEINUR NEGATIVE 08/17/2022 0254   NITRITE NEGATIVE 08/17/2022 0254   LEUKOCYTESUR NEGATIVE 08/17/2022 0254   LEUKOCYTESUR Negative 02/04/2013 1430   Sepsis Labs: @LABRCNTIP (procalcitonin:4,lacticidven:4) Microbiology: Recent Results (from the past 240 hours)  Resp panel by RT-PCR (RSV, Flu A&B, Covid) Anterior Nasal Swab     Status: None   Collection Time: 01/12/24  9:33 AM   Specimen: Anterior Nasal Swab  Result Value Ref Range Status   SARS Coronavirus 2 by RT PCR NEGATIVE NEGATIVE Final    Comment: (NOTE) SARS-CoV-2 target nucleic acids are NOT DETECTED.  The SARS-CoV-2 RNA is generally detectable in upper respiratory specimens during the acute phase of infection. The lowest concentration of SARS-CoV-2 viral copies this assay can detect is 138 copies/mL. A negative result does not preclude SARS-Cov-2 infection and should not be used as the sole basis for treatment or other patient management decisions. A negative result may occur with  improper specimen collection/handling, submission of specimen other than nasopharyngeal swab, presence of viral mutation(s) within the areas targeted by this assay, and inadequate number of viral copies(<138 copies/mL). A negative result must be combined with clinical observations, patient history, and epidemiological information. The expected result is Negative.  Fact Sheet for Patients:  BloggerCourse.com  Fact Sheet for Healthcare Providers:   SeriousBroker.it  This test is no t yet approved or cleared by the [place] FDA and  has been authorized for detection and/or diagnosis of SARS-CoV-2 by FDA under an Emergency Use Authorization (EUA). This EUA will remain  in effect (meaning this test can be used) for the duration  of the COVID-19 declaration under Section 564(b)(1) of the Act, 21 U.S.C.section 360bbb-3(b)(1), unless the authorization is terminated  or revoked sooner.       Influenza A by PCR NEGATIVE NEGATIVE Final   Influenza B by PCR NEGATIVE NEGATIVE Final    Comment: (NOTE) The Xpert Xpress SARS-CoV-2/FLU/RSV plus assay is intended as an aid in the diagnosis of influenza from Nasopharyngeal swab specimens and should not be used as a sole basis for treatment. Nasal washings and aspirates are unacceptable for Xpert Xpress SARS-CoV-2/FLU/RSV testing.  Fact Sheet for Patients: BloggerCourse.com  Fact Sheet for Healthcare Providers: SeriousBroker.it  This test is not yet approved or cleared by the [place] FDA and has been authorized for detection and/or diagnosis of SARS-CoV-2 by FDA under an Emergency Use Authorization (EUA). This EUA will remain in effect (meaning this test can be used) for the duration of the COVID-19 declaration under Section 564(b)(1) of the Act, 21 U.S.C. section 360bbb-3(b)(1), unless the authorization is terminated or revoked.     Resp Syncytial Virus by PCR NEGATIVE NEGATIVE Final    Comment: (NOTE) Fact Sheet for Patients: BloggerCourse.com  Fact Sheet for Healthcare Providers: SeriousBroker.it  This test is not yet approved or cleared by the [place] FDA and has been authorized for detection and/or diagnosis of SARS-CoV-2 by FDA under an Emergency Use Authorization (EUA). This EUA will remain in effect (meaning this test can be used) for  the duration of the COVID-19 declaration under Section 564(b)(1) of the Act, 21 U.S.C. section 360bbb-3(b)(1), unless the authorization is terminated or revoked.  Performed at [facility], [address], [place], [place] [address]       Radiology Studies last 3 days: DG Chest Portable 1 View Result Date: 01/12/2024 CLINICAL DATA:  Shortness of breath. EXAM: PORTABLE CHEST 1 VIEW COMPARISON:  08/23/2023. FINDINGS: Bilateral lungs appear hyperexpanded and hyperlucent with coarse bronchovascular markings, in keeping with COPD. There is paucity of markings in the bilateral upper/mid zones, compatible with bullous changes. Bilateral lungs otherwise appear clear. No dense consolidation or lung collapse. Bilateral costophrenic angles are clear. Normal cardio-mediastinal silhouette. No acute osseous abnormalities. The soft tissues are within normal limits. IMPRESSION: *No active disease. COPD with bullous changes in the bilateral upper/mid lung zones. Electronically Signed   By: Jules Schick M.D.   On: 01/12/2024 10:32         Sunnie Nielsen, DO [facility] 01/13/2024, 4:55 PM    Dictation software may have been used to generate the above note. Typos may occur and escape review in typed/dictated notes. Please contact Dr Lyn Hollingshead directly for clarity if needed.  Staff may message me via secure chat in Epic  but this may not receive an immediate response,  please page me for urgent matters!  If 7PM-7AM, please contact night coverage [email or web address]

## 2024-01-13 NOTE — ED Notes (Signed)
Called pharmacy to correct order.

## 2024-01-13 NOTE — ED Notes (Signed)
Pt sleeping w/ even and unlabored respirations.

## 2024-01-13 NOTE — ED Notes (Signed)
Pt given blanket for comfort. C/o left hip/back pain, medicated per MAR. Denies other needs/complaints. Pending inpt bed.

## 2024-01-13 NOTE — ED Notes (Signed)
Administered 7.5 mg of methotrexate per patient request and to take the other 2 doses later in the evening. Alexander, DO notified and approved. RN requested for order to be modified in chart.

## 2024-01-14 DIAGNOSIS — J441 Chronic obstructive pulmonary disease with (acute) exacerbation: Secondary | ICD-10-CM | POA: Diagnosis not present

## 2024-01-14 LAB — BASIC METABOLIC PANEL
Anion gap: 12 (ref 5–15)
BUN: 54 mg/dL — ABNORMAL HIGH (ref 8–23)
CO2: 29 mmol/L (ref 22–32)
Calcium: 9.1 mg/dL (ref 8.9–10.3)
Chloride: 91 mmol/L — ABNORMAL LOW (ref 98–111)
Creatinine, Ser: 1.42 mg/dL — ABNORMAL HIGH (ref 0.61–1.24)
GFR, Estimated: 53 mL/min — ABNORMAL LOW (ref >60)
Glucose, Bld: 119 mg/dL — ABNORMAL HIGH (ref 70–99)
Potassium: 4.9 mmol/L (ref 3.5–5.1)
Sodium: 132 mmol/L — ABNORMAL LOW (ref 135–145)

## 2024-01-14 LAB — CBC
HCT: 42.7 % (ref 39.0–52.0)
Hemoglobin: 13.6 g/dL (ref 13.0–17.0)
MCH: 27.9 pg (ref 26.0–34.0)
MCHC: 31.9 g/dL (ref 30.0–36.0)
MCV: 87.5 fL (ref 80.0–100.0)
Platelets: 260 10*3/uL (ref 150–400)
RBC: 4.88 MIL/uL (ref 4.22–5.81)
RDW: 19.3 % — ABNORMAL HIGH (ref 11.5–15.5)
WBC: 18.2 10*3/uL — ABNORMAL HIGH (ref 4.0–10.5)
nRBC: 0 % (ref 0.0–0.2)

## 2024-01-14 MED ORDER — METOPROLOL SUCCINATE ER 25 MG PO TB24
12.5000 mg | ORAL_TABLET | Freq: Every day | ORAL | Status: DC
  Administered 2024-01-15: 12.5 mg via ORAL
  Filled 2024-01-14: qty 1

## 2024-01-14 MED ORDER — METOPROLOL SUCCINATE ER 25 MG PO TB24: 25.0000 mg | ORAL_TABLET | Freq: Every day | ORAL | Status: DC

## 2024-01-14 MED ORDER — ONDANSETRON HCL 4 MG/2ML IJ SOLN
4.0000 mg | Freq: Once | INTRAMUSCULAR | Status: AC
  Administered 2024-01-14: 4 mg via INTRAVENOUS
  Filled 2024-01-14: qty 2

## 2024-01-14 MED ORDER — ISOSORBIDE MONONITRATE ER 30 MG PO TB24
15.0000 mg | ORAL_TABLET | Freq: Every day | ORAL | Status: DC
  Administered 2024-01-15: 15 mg via ORAL
  Filled 2024-01-14: qty 1

## 2024-01-14 NOTE — Progress Notes (Signed)
PROGRESS NOTE    TREVIAN HAYASHIDA   WUJ:[id number] DOB: 1950-12-29  DOA: 01/12/2024 Date of Service: 01/14/24 which is hospital day 2  PCP: [facility] course / significant events:   HPI: Darius Norris is a 73 y.o. male with a past medical history of atrial fibrillation on anticoagulation, CAD status post MI 30+ years ago per patient no stents, COPD on 4 L chronically, diabetes, hypertension, presents to the emergency department 01/12/24 for chest pain and shortness of breath.  Since awakening morning around 07:00 SOB, chest pain described as tightness. Per EMS patient was satting 84% on 4 L, placed on NRB. He denies fever/chills, denies fatigue, reports cough, denies unusual pain other than chest tightness.   01/25: In ED: CXR No active disease, COPD with bullous changes in the bilateral upper/mid lung zones. BNP about baseline 107. Troponin 18 --> 33. WBC 15.4. Requiring up to 10L HFNC, Received SoluMedrol 125, Albuterol neb. Tachycardia Afib, received Cardizem IV. Admitted to hospitalist service for COPD exacerbation.  01/26: breathing improved but still on HFNC this morning, down to 4-6L this afternoon. Troponin up slightly but follows the expected curve, EKG remain stable from previous/old EKG and no chest pain.  01/27: back to 4L O2 which is his baseline however soft BP and labile SpO2 will keep through tonight, adjusting home cardiac meds and asking pharmacy to confirm dispense hx      Consultants:  none  Procedures/Surgeries: none      ASSESSMENT & PLAN:   COPD exacerbation  Acute on Chronic hypoxic respiratory failure  Chronic hypoxic respiratory failure on 4L [place] O2 at baseline  DuoNeb q6h scheduled, q2h prn  S/p SoluMedrol in ED, now on prednisone  Guaifenesin expectorant  Pt not clear about home inhalers, continue nebs for now and will ask pharmacy to verify   Chest pain, resolved Mild elevation troponin likely d/t demand/supply  mismatch d/t hypoxia/COPD Trend troponin - not concerning for ACS Troponin/EKG prn chest pain / trend - has been no change from prior EKG, chronic inverted Twaves/ischemic changes  Cardiac monitoring ok to d/c   AKI - mild  Likely prerenal / dehydration Mild, GFR still >60, Cr 1.26 vs baseline 0.7-0.8 Monitor BMP  Leukocytosis  Increase likely d/t steroids Monitor CBC outpatient   Atrial fibrillation  Rebound tachycardia resolved w/ resume BB Continue dabigatran 150 mg daily stop diltiazem, based on last cardiology note he is not on this  Pt reports he is no longer taking metoprolol but based on cardiology notes it was changed from tartrate to succinate, continued metoprolol succinate 200 mg daily but BP has been low   Pt declines cardiac monitoring  Cardiac monitoring order dc   CAD status post MI 30+ years ago per patient no stents NSTEMI 2001 HTN but BP has been low toda HLD on home ASA, atorvastatin 40 mg daily, furosemide 40 mg daily, isosorbide mononitrate 40 mg daily, losartan 25 mg daily, metoprolol XL 200 mg daily per last cardiology note but pt is unclear/inconsistent  Pharmacy to please confirm Continue ASA, atorvastatin Holding furosemide  Continue isosorbide but lowered dose  Holding Losartan  Continue metoprolol but lowered dose   Chronic HFpEF continue on home lasix 40 mg daily  Strict in/out  No cardizem     overweight based on BMI: Body mass index is 27.44 kg/m.  Underweight - under 18  overweight - 25 to 29 obese - 30 or more Class 1 obesity: BMI of  30.0 to 34 Class 2 obesity: BMI of 35.0 to 39 Class 3 obesity: BMI of 40.0 to 49 Super Morbid Obesity: BMI 50-59 Super-super Morbid Obesity: BMI 60+ Significantly low or high BMI is associated with higher medical risk.  Weight management advised as adjunct to other disease management and risk reduction treatments    DVT prophylaxis: pradaxa IV fluids: no continuous IV fluids  Nutrition: cardiac  diet  Central lines / invasive devices: none  Code Status: FULL CODE he states he would want CPR/intubation, would not want to be on life support more than 5 days  ACP documentation reviewed: none on file in [facility]  TOC needs: TBD Barriers to dispo / significant pending items: low BP, labile SpO2, if these stabilize hopeful for DC home tomorrow              Subjective / Brief ROS:  Patient reports breathing a bit better today States he is wlking around his room fine  Denies CP No SOB at rest  Pain controlled.  Denies new weakness.  Tolerating diet.   Family Communication: none at this time     Objective Findings:  Vitals:   01/14/24 0615 01/14/24 0618 01/14/24 0619 01/14/24 0952  BP: 97/62   90/61  Pulse: 86   86  Resp:   (!) 22 (!) 24  Temp:  (!) 97.4 F (36.3 C)  97.9 F (36.6 C)  TempSrc:  Oral  Oral  SpO2: 100%   95%  Weight:      Height:        Intake/Output Summary (Last 24 hours) at 01/14/2024 1327 Last data filed at 01/13/2024 1901 Gross per 24 hour  Intake --  Output 300 ml  Net -300 ml   Filed Weights   01/12/24 0930  Weight: 77.1 kg    Examination:  Physical Exam Constitutional:      General: He is not in acute distress. Cardiovascular:     Rate and Rhythm: Normal rate and regular rhythm.  Pulmonary:     Effort: Pulmonary effort is normal.     Breath sounds: Decreased breath sounds present.  Musculoskeletal:     Right lower leg: No edema.     Left lower leg: No edema.  Neurological:     General: No focal deficit present.     Mental Status: He is alert and oriented to person, place, and time.  Psychiatric:        Mood and Affect: Mood normal.        Behavior: Behavior normal.          Scheduled Medications:   aspirin EC  81 mg Oral Daily   atorvastatin  40 mg Oral QHS   busPIRone  5 mg Oral BID   dabigatran  150 mg Oral BID   diphenhydrAMINE  50 mg Oral Once   empagliflozin  10 mg Oral Daily   folic acid  2 mg Oral  Once per day on Monday Tuesday Wednesday Thursday Friday Saturday   gabapentin  600 mg Oral TID   guaiFENesin  1,200 mg Oral BID   ipratropium-albuterol  3 mL Nebulization Q6H   [START ON 01/15/2024] isosorbide mononitrate  15 mg Oral Daily   magnesium oxide  800 mg Oral Daily   melatonin  5 mg Oral QHS   methotrexate  12.5 mg Oral Weekly   [START ON 01/15/2024] metoprolol succinate  12.5 mg Oral Daily   mometasone-formoterol  2 puff Inhalation BID   predniSONE  40  mg Oral Q breakfast    Continuous Infusions:   PRN Medications:  ipratropium-albuterol, lidocaine, loratadine, oxyCODONE, senna, simethicone, traZODone  Antimicrobials from admission:  Anti-infectives (From admission, onward)    None           Data Reviewed:  I have personally reviewed the following...  CBC: Recent Labs  Lab 01/12/24 0933 01/13/24 0930 01/14/24 0553  WBC 15.4* 12.4* 18.2*  HGB 13.3 13.2 13.6  HCT 43.3 42.1 42.7  MCV 90.8 89.6 87.5  PLT 273 261 260   Basic Metabolic Panel: Recent Labs  Lab 01/12/24 0933 01/13/24 0930 01/14/24 0553  NA 136 132* 132*  K 4.6 4.2 4.9  CL 96* 92* 91*  CO2 28 27 29   GLUCOSE 127* 167* 119*  BUN 31* 40* 54*  CREATININE 1.26* 1.34* 1.42*  CALCIUM 9.1 9.4 9.1   GFR: Estimated Creatinine Clearance: 46 mL/min (A) (by C-G formula based on SCr of 1.42 mg/dL (H)). Liver Function Tests: Recent Labs  Lab 01/12/24 0933  AST 16  ALT 17  ALKPHOS 62  BILITOT 0.5  PROT 6.9  ALBUMIN 3.8   No results for input(s): "LIPASE", "AMYLASE" in the last 168 hours. No results for input(s): "AMMONIA" in the last 168 hours. Coagulation Profile: No results for input(s): "INR", "PROTIME" in the last 168 hours. Cardiac Enzymes: No results for input(s): "CKTOTAL", "CKMB", "CKMBINDEX", "TROPONINI" in the last 168 hours. BNP (last 3 results) No results for input(s): "PROBNP" in the last 8760 hours. HbA1C: No results for input(s): "HGBA1C" in the last 72  hours. CBG: No results for input(s): "GLUCAP" in the last 168 hours. Lipid Profile: No results for input(s): "CHOL", "HDL", "LDLCALC", "TRIG", "CHOLHDL", "LDLDIRECT" in the last 72 hours. Thyroid Function Tests: No results for input(s): "TSH", "T4TOTAL", "FREET4", "T3FREE", "THYROIDAB" in the last 72 hours. Anemia Panel: No results for input(s): "VITAMINB12", "FOLATE", "FERRITIN", "TIBC", "IRON", "RETICCTPCT" in the last 72 hours. Most Recent Urinalysis On File:     Component Value Date/Time   COLORURINE YELLOW (A) 08/17/2022 0254   APPEARANCEUR CLEAR (A) 08/17/2022 0254   APPEARANCEUR Clear 02/04/2013 1430   LABSPEC 1.011 08/17/2022 0254   LABSPEC 1.004 02/04/2013 1430   PHURINE 5.0 08/17/2022 0254   GLUCOSEU NEGATIVE 08/17/2022 0254   GLUCOSEU Negative 02/04/2013 1430   HGBUR NEGATIVE 08/17/2022 0254   BILIRUBINUR NEGATIVE 08/17/2022 0254   BILIRUBINUR Negative 02/04/2013 1430   KETONESUR NEGATIVE 08/17/2022 0254   PROTEINUR NEGATIVE 08/17/2022 0254   NITRITE NEGATIVE 08/17/2022 0254   LEUKOCYTESUR NEGATIVE 08/17/2022 0254   LEUKOCYTESUR Negative 02/04/2013 1430   Sepsis Labs: @LABRCNTIP (procalcitonin:4,lacticidven:4) Microbiology: Recent Results (from the past 240 hours)  Resp panel by RT-PCR (RSV, Flu A&B, Covid) Anterior Nasal Swab     Status: None   Collection Time: 01/12/24  9:33 AM   Specimen: Anterior Nasal Swab  Result Value Ref Range Status   SARS Coronavirus 2 by RT PCR NEGATIVE NEGATIVE Final    Comment: (NOTE) SARS-CoV-2 target nucleic acids are NOT DETECTED.  The SARS-CoV-2 RNA is generally detectable in upper respiratory specimens during the acute phase of infection. The lowest concentration of SARS-CoV-2 viral copies this assay can detect is 138 copies/mL. A negative result does not preclude SARS-Cov-2 infection and should not be used as the sole basis for treatment or other patient management decisions. A negative result may occur with  improper  specimen collection/handling, submission of specimen other than nasopharyngeal swab, presence of viral mutation(s) within the areas targeted by this assay, and inadequate  number of viral copies(<138 copies/mL). A negative result must be combined with clinical observations, patient history, and epidemiological information. The expected result is Negative.  Fact Sheet for Patients:  BloggerCourse.com  Fact Sheet for Healthcare Providers:  SeriousBroker.it  This test is no t yet approved or cleared by the [place] FDA and  has been authorized for detection and/or diagnosis of SARS-CoV-2 by FDA under an Emergency Use Authorization (EUA). This EUA will remain  in effect (meaning this test can be used) for the duration of the COVID-19 declaration under Section 564(b)(1) of the Act, 21 U.S.C.section 360bbb-3(b)(1), unless the authorization is terminated  or revoked sooner.       Influenza A by PCR NEGATIVE NEGATIVE Final   Influenza B by PCR NEGATIVE NEGATIVE Final    Comment: (NOTE) The Xpert Xpress SARS-CoV-2/FLU/RSV plus assay is intended as an aid in the diagnosis of influenza from Nasopharyngeal swab specimens and should not be used as a sole basis for treatment. Nasal washings and aspirates are unacceptable for Xpert Xpress SARS-CoV-2/FLU/RSV testing.  Fact Sheet for Patients: BloggerCourse.com  Fact Sheet for Healthcare Providers: SeriousBroker.it  This test is not yet approved or cleared by the [place] FDA and has been authorized for detection and/or diagnosis of SARS-CoV-2 by FDA under an Emergency Use Authorization (EUA). This EUA will remain in effect (meaning this test can be used) for the duration of the COVID-19 declaration under Section 564(b)(1) of the Act, 21 U.S.C. section 360bbb-3(b)(1), unless the authorization is terminated or revoked.     Resp  Syncytial Virus by PCR NEGATIVE NEGATIVE Final    Comment: (NOTE) Fact Sheet for Patients: BloggerCourse.com  Fact Sheet for Healthcare Providers: SeriousBroker.it  This test is not yet approved or cleared by the [place] FDA and has been authorized for detection and/or diagnosis of SARS-CoV-2 by FDA under an Emergency Use Authorization (EUA). This EUA will remain in effect (meaning this test can be used) for the duration of the COVID-19 declaration under Section 564(b)(1) of the Act, 21 U.S.C. section 360bbb-3(b)(1), unless the authorization is terminated or revoked.  Performed at [facility], [address], [place], [place] [address]       Radiology Studies last 3 days: DG Chest Portable 1 View Result Date: 01/12/2024 CLINICAL DATA:  Shortness of breath. EXAM: PORTABLE CHEST 1 VIEW COMPARISON:  08/23/2023. FINDINGS: Bilateral lungs appear hyperexpanded and hyperlucent with coarse bronchovascular markings, in keeping with COPD. There is paucity of markings in the bilateral upper/mid zones, compatible with bullous changes. Bilateral lungs otherwise appear clear. No dense consolidation or lung collapse. Bilateral costophrenic angles are clear. Normal cardio-mediastinal silhouette. No acute osseous abnormalities. The soft tissues are within normal limits. IMPRESSION: *No active disease. COPD with bullous changes in the bilateral upper/mid lung zones. Electronically Signed   By: Jules Schick M.D.   On: 01/12/2024 10:32         Sunnie Nielsen, DO [facility] 01/14/2024, 1:27 PM    Dictation software may have been used to generate the above note. Typos may occur and escape review in typed/dictated notes. Please contact Dr Lyn Hollingshead directly for clarity if needed.  Staff may message me via secure chat in Epic  but this may not receive an immediate response,  please page me for urgent matters!  If  7PM-7AM, please contact night coverage [email or web address]

## 2024-01-14 NOTE — ED Notes (Signed)
Pt ambulated approx 100 ft. no desaturations. Pt O2 sat around 92-96% on 4L (baseline O2) when he ambulated with walker.

## 2024-01-14 NOTE — ED Notes (Signed)
Pt notified about shared room

## 2024-01-14 NOTE — ED Notes (Signed)
Pt ambulatory with walker with 1 RN assist to BR. Pt hooked up to his chosen selection of monitor.

## 2024-01-14 NOTE — Progress Notes (Signed)
   01/14/24 1328  Readmission Prevention Plan - High Risk  Transportation Screening Complete  PCP or Specialist appointment within 5-7 days of discharge Complete (Nurse secretary will schedule at time of discharge)  High Risk Social Work Consult for recovery care planning/counseling (includes patient and caregiver) Complete  High Risk Palliative Care Screening Not Applicable  Medication Review Complete   CSW spoke with pt to complete High Risk Readmission assessment. Pt reports he uses oxygen at home- 4L and oxygen company is [employer] out of [place]. No TOC needs at this time. Please consult TOC if any needs arise.

## 2024-01-14 NOTE — ED Notes (Signed)
HCP notified of Pts low BP, even after switching arms and taking manually. Pt also reminded several times to keep monitor leads on and Pt keeps taking them off. HCP notified.

## 2024-01-14 NOTE — ED Notes (Signed)
Pt refusing to use RN call bell to get help to ambulate, refuses to wear cardiac monitor, and refusing bed alarm so RN can come assist.

## 2024-01-15 DIAGNOSIS — J441 Chronic obstructive pulmonary disease with (acute) exacerbation: Secondary | ICD-10-CM | POA: Diagnosis not present

## 2024-01-15 DIAGNOSIS — E440 Moderate protein-calorie malnutrition: Secondary | ICD-10-CM | POA: Insufficient documentation

## 2024-01-15 LAB — BASIC METABOLIC PANEL
Anion gap: 9 (ref 5–15)
BUN: 53 mg/dL — ABNORMAL HIGH (ref 8–23)
CO2: 26 mmol/L (ref 22–32)
Calcium: 8.9 mg/dL (ref 8.9–10.3)
Chloride: 95 mmol/L — ABNORMAL LOW (ref 98–111)
Creatinine, Ser: 1.28 mg/dL — ABNORMAL HIGH (ref 0.61–1.24)
GFR, Estimated: 59 mL/min — ABNORMAL LOW (ref >60)
Glucose, Bld: 85 mg/dL (ref 70–99)
Potassium: 4.7 mmol/L (ref 3.5–5.1)
Sodium: 130 mmol/L — ABNORMAL LOW (ref 135–145)

## 2024-01-15 MED ORDER — METHOTREXATE SODIUM 2.5 MG PO TABS: 5.0000 mg | ORAL_TABLET | ORAL | Status: DC

## 2024-01-15 MED ORDER — FUROSEMIDE 40 MG PO TABS: 20.0000 mg | ORAL_TABLET | Freq: Every day | ORAL | Status: AC

## 2024-01-15 MED ORDER — METHOTREXATE SODIUM 2.5 MG PO TABS: ORAL_TABLET | ORAL | Status: AC

## 2024-01-15 MED ORDER — PREDNISONE 10 MG PO TABS: ORAL_TABLET | ORAL | 0 refills | Status: AC

## 2024-01-15 MED ORDER — MELATONIN 5 MG PO TABS: 10.0000 mg | ORAL_TABLET | Freq: Every day | ORAL | Status: DC

## 2024-01-15 MED ORDER — ISOSORBIDE MONONITRATE ER 30 MG PO TB24: 15.0000 mg | ORAL_TABLET | Freq: Every day | ORAL | 0 refills | Status: DC

## 2024-01-15 MED ORDER — ADULT MULTIVITAMIN W/MINERALS CH
1.0000 | ORAL_TABLET | Freq: Every day | ORAL | Status: DC
  Administered 2024-01-15: 1 via ORAL
  Filled 2024-01-15: qty 1

## 2024-01-15 MED ORDER — GABAPENTIN 300 MG PO CAPS: 600.0000 mg | ORAL_CAPSULE | Freq: Two times a day (BID) | ORAL | Status: DC

## 2024-01-15 MED ORDER — BOOST PLUS PO LIQD
237.0000 mL | Freq: Two times a day (BID) | ORAL | Status: DC
Start: 2024-01-15 — End: 2024-01-15
  Administered 2024-01-15: 237 mL via ORAL
  Filled 2024-01-15: qty 237

## 2024-01-15 MED ORDER — METOPROLOL SUCCINATE ER 25 MG PO TB24: 12.5000 mg | ORAL_TABLET | Freq: Every day | ORAL | 0 refills | Status: DC

## 2024-01-15 MED ORDER — METHOTREXATE SODIUM 2.5 MG PO TABS: 7.5000 mg | ORAL_TABLET | ORAL | Status: DC

## 2024-01-15 MED ORDER — BUSPIRONE HCL 5 MG PO TABS: 2.5000 mg | ORAL_TABLET | Freq: Two times a day (BID) | ORAL | Status: DC

## 2024-01-15 MED ORDER — MAGNESIUM OXIDE 400 MG PO TABS: 400.0000 mg | ORAL_TABLET | Freq: Two times a day (BID) | ORAL | Status: DC

## 2024-01-15 MED ORDER — PANTOPRAZOLE SODIUM 40 MG PO TBEC
40.0000 mg | DELAYED_RELEASE_TABLET | Freq: Every day | ORAL | Status: DC
  Administered 2024-01-15: 40 mg via ORAL
  Filled 2024-01-15: qty 1

## 2024-01-15 MED ORDER — FOLIC ACID 1 MG PO TABS: 1.0000 mg | ORAL_TABLET | ORAL | Status: DC

## 2024-01-15 MED ORDER — MAGNESIUM OXIDE 400 MG PO TABS
400.0000 mg | ORAL_TABLET | Freq: Two times a day (BID) | ORAL | Status: DC
  Filled 2024-01-15: qty 1

## 2024-01-15 MED ORDER — OXYCODONE HCL 5 MG PO TABS: 5.0000 mg | ORAL_TABLET | ORAL | 0 refills | Status: DC | PRN

## 2024-01-15 NOTE — Plan of Care (Signed)

## 2024-01-15 NOTE — TOC Transition Note (Signed)
Transition of Care [facility]) - Discharge Note   Patient Details  Name: WESTIN KNOTTS MRN: [id number] Date of Birth: Mar 18, 1951  [facility]) CM/SW Contact:  Chapman Fitch, RN Phone Number: 01/15/2024, 4:09 PM   Clinical Narrative:     Per MD no home health needs for discharge        Patient Goals and CMS Choice            Discharge Placement                       Discharge Plan and Services Additional resources added to the After Visit Summary for                                       Social Drivers of Health (SDOH) Interventions SDOH Screenings   Food Insecurity: No Food Insecurity (01/14/2024)  Housing: Low Risk  (01/14/2024)  Transportation Needs: No Transportation Needs (01/14/2024)  Utilities: Not At Risk (01/14/2024)  Depression (PHQ2-9): Low Risk  (10/10/2023)  Social Connections: Moderately Integrated (01/14/2024)  Tobacco Use: Medium Risk (01/12/2024)     Readmission Risk Interventions    01/14/2024    1:28 PM 08/26/2023   12:35 PM  Readmission Risk Prevention Plan  Transportation Screening Complete Complete  PCP or Specialist Appt within 5-7 Days  Complete  PCP or Specialist Appt within 3-5 Days Complete   Home Care Screening  Complete  Medication Review (RN CM)  Complete  Social Work Consult for Recovery Care Planning/Counseling Complete   Palliative Care Screening Not Applicable   Medication Review Oceanographer) Complete

## 2024-01-15 NOTE — Discharge Summary (Signed)
Physician Discharge Summary   Patient: Darius Norris MRN: [id number]  DOB: [birth year]   Admit:     Date of Admission: 01/12/2024 Admitted from: home   Discharge: Date of discharge: 01/15/24 Disposition: Home Condition at discharge: good  CODE STATUS: FULL CODE     Discharge Physician: Sunnie Nielsen, DO [facility]     PCP: [facility]  Recommendations for Outpatient Follow-up:  Follow up with PCP [facility] in 1-2 weeks Please obtain labs/tests: BMP, CBC in 1-2 weeks, needs BP check     Discharge Instructions     Diet - low sodium heart healthy   Complete by: As directed    Increase activity slowly   Complete by: As directed          Discharge Diagnoses: Principal Problem:   COPD exacerbation (HCC) Active Problems:   COPD (chronic obstructive pulmonary disease) (HCC)   Malnutrition of moderate degree        Hospital course / significant events:   HPI: Darius Norris is a 73 y.o. male with a past medical history of atrial fibrillation on anticoagulation, CAD status post MI 30+ years ago per patient no stents, COPD on 4 L chronically, diabetes, hypertension, presents to the emergency department 01/12/24 for chest pain and shortness of breath.  Since awakening morning around 07:00 SOB, chest pain described as tightness. Per EMS patient was satting 84% on 4 L, placed on NRB. He denies fever/chills, denies fatigue, reports cough, denies unusual pain other than chest tightness.   01/25: In ED: CXR No active disease, COPD with bullous changes in the bilateral upper/mid lung zones. BNP about baseline 107. Troponin 18 --> 33. WBC 15.4. Requiring up to 10L HFNC, Received SoluMedrol 125, Albuterol neb. Tachycardia Afib, received Cardizem IV. Admitted to hospitalist service for COPD exacerbation.  01/26: breathing improved but still on HFNC this morning, down to 4-6L this afternoon. Troponin up slightly but follows the  expected curve, EKG remain stable from previous/old EKG and no chest pain.  01/27: back to 4L O2 which is his baseline however soft BP and labile SpO2 will keep through tonight, adjusting home cardiac meds and asking pharmacy to confirm dispense hx  01/28: BP stable, O2 stable, pt appropriate for dc home on baseline O2 and close outpatient f/u      Consultants:  none  Procedures/Surgeries: none      ASSESSMENT & PLAN:   COPD exacerbation  Acute on Chronic hypoxic respiratory failure  Chronic hypoxic respiratory failure on 4L [place] O2 at baseline  S/p SoluMedrol in ED, now on prednisone to taper  Guaifenesin expectorant  Continue home Inhalers  Follow w/ pulmonology outpatient   Chest pain, resolved Mild elevation troponin likely d/t demand/supply mismatch d/t hypoxia/COPD Trend troponin - not concerning for ACS  AKI - mild  Likely prerenal / dehydration Mild, GFR still >60, Cr 1.26 vs baseline 0.7-0.8 Monitor BMP  Leukocytosis  Increase likely d/t steroids Monitor CBC outpatient   Atrial fibrillation  Rebound tachycardia resolved w/ resume BB Continue dabigatran 150 mg daily stop diltiazem, based on last cardiology note he is not on this  Pt reports he is no longer taking metoprolol but based on cardiology notes it was changed from tartrate to succinate, continued metoprolol succinate 200 mg daily but BP has been low so lowered this dose see med rec   Pt declines cardiac monitoring  Cardiac monitoring order dc   CAD status post MI 30+  years ago per patient no stents NSTEMI 2001 HTN but BP has been low toda HLD Chronic HFpEF on home ASA, atorvastatin 40 mg daily, furosemide 40 mg daily, isosorbide mononitrate 40 mg daily, losartan 25 mg daily, metoprolol XL 200 mg daily per last cardiology note but pt is unclear/inconsistent  Continue ASA, atorvastatin Holding furosemide, can resume prn on discharge  Continue isosorbide but lowered dose d/t low BP Holding  Losartan for now, restart outpatient as BP allows Continue metoprolol but lowered dose    overweight based on BMI: Body mass index is 27.44 kg/m.  Underweight - under 18  overweight - 25 to 29 obese - 30 or more Class 1 obesity: BMI of 30.0 to 34 Class 2 obesity: BMI of 35.0 to 39 Class 3 obesity: BMI of 40.0 to 49 Super Morbid Obesity: BMI 50-59 Super-super Morbid Obesity: BMI 60+ Significantly low or high BMI is associated with higher medical risk.  Weight management advised as adjunct to other disease management and risk reduction treatments          Discharge Instructions  Allergies as of 01/15/2024       Reactions   Zantac [ranitidine Hcl] Shortness Of Breath   Eruption, HIVES, SHORTNESS OF BREATH, Eruption, HIVES, SHORTNESS OF BREATH   Molnupiravir Swelling   Codeine Itching   Other reaction(s): PRURITIS   Erythromycin Hives   Other reaction(s): SWELLING (NON-SPECIFIC), HIVES, SWELLING (NON-SPECIFIC), HIVES   Morphine Itching   Tiotropium Bromide Monohydrate    Hydrocodone Rash   Other reaction(s): PRURITIS        Medication List     STOP taking these medications    budesonide-formoterol 160-4.5 MCG/ACT inhaler Commonly known as: SYMBICORT   diltiazem 300 MG 24 hr capsule Commonly known as: CARDIZEM CD   hydrocortisone 2.5 % cream   loratadine 10 MG tablet Commonly known as: CLARITIN   losartan 25 MG tablet Commonly known as: COZAAR       TAKE these medications    acetaminophen 325 MG tablet Commonly known as: TYLENOL Take 975 mg by mouth every 6 (six) hours as needed for mild pain (pain score 1-3).   albuterol 108 (90 Base) MCG/ACT inhaler Commonly known as: VENTOLIN HFA Inhale 1-2 puffs into the lungs every 6 (six) hours as needed for shortness of breath or wheezing.   aspirin EC 81 MG tablet Take 1 tablet (81 mg total) by mouth daily.   atorvastatin 80 MG tablet Commonly known as: LIPITOR Take 0.5 tablets (40 mg total) by mouth  at bedtime. Half of an 80   busPIRone 5 MG tablet Commonly known as: BUSPAR Take 0.5 tablets (2.5 mg total) by mouth 2 (two) times daily. What changed:  medication strength how much to take   camphor-menthol lotion Commonly known as: SARNA Apply 1 Application topically as needed for itching.   dabigatran 150 MG Caps capsule Commonly known as: PRADAXA Take 150 mg by mouth 2 (two) times daily.   empagliflozin 25 MG Tabs tablet Commonly known as: JARDIANCE Take 12.5 mg by mouth daily. Take 0.5 tablet (12.5 mg) by mouth once daily   fluticasone 50 MCG/ACT nasal spray Commonly known as: FLONASE Place 2 sprays into both nostrils daily as needed.   folic acid 1 MG tablet Commonly known as: FOLVITE Take 2 mg by mouth daily. Hold on Sunday when taking Methotrexate   furosemide 40 MG tablet Commonly known as: LASIX Take 0.5 tablets (20 mg total) by mouth daily. Take 1 tablet (20 mg total)  by mouth TWICE daily (total daily dose 40 mg) as needed for up to 3 days for increased leg swelling, shortness of breath, weight gain 5+ lbs over 1-2 days. Seek medical care if these symptoms are not improving with increased dose. What changed:  how much to take additional instructions   gabapentin 300 MG capsule Commonly known as: NEURONTIN Take 600 mg by mouth 3 (three) times daily.   guaifenesin 400 MG Tabs tablet Commonly known as: HUMIBID E Take 400 mg by mouth every 4 (four) hours as needed.   isosorbide mononitrate 30 MG 24 hr tablet Commonly known as: IMDUR Take 0.5 tablets (15 mg total) by mouth daily. What changed:  medication strength how much to take   lidocaine 5 % Commonly known as: LIDODERM Place 1 patch onto the skin daily as needed. Remove & Discard patch within 12 hours or as directed by MD   magnesium oxide 400 MG tablet Commonly known as: MAG-OX Take 1 tablet (400 mg total) by mouth 2 (two) times daily. What changed:  how much to take when to take this    Melatonin 3 MG Caps Take 2 capsules by mouth at bedtime.   metFORMIN 500 MG tablet Commonly known as: GLUCOPHAGE Take 500 mg by mouth 2 (two) times daily with a meal.   methotrexate 2.5 MG tablet Commonly known as: RHEUMATREX Take 3 tablets (7.5 mg total) by mouth once a week AND 2 tablets (5 mg total) once a week. Caution:Chemotherapy. Protect from light.. Start taking on: January 20, 2024 What changed:  See the new instructions. These instructions start on January 20, 2024. If you are unsure what to do until then, ask your doctor or other care provider.   metoprolol succinate 25 MG 24 hr tablet Commonly known as: TOPROL-XL Take 0.5 tablets (12.5 mg total) by mouth daily. Start taking on: January 16, 2024 What changed:  medication strength how much to take   mometasone-formoterol 100-5 MCG/ACT Aero Commonly known as: DULERA Inhale 2 puffs into the lungs 2 (two) times daily.   naloxone 4 MG/0.1ML Liqd nasal spray kit Commonly known as: NARCAN Place 1 spray into the nose once.   omeprazole 20 MG capsule Commonly known as: PRILOSEC Take 20 mg by mouth daily.   oxyCODONE 5 MG immediate release tablet Commonly known as: Oxy IR/ROXICODONE Take 1 tablet (5 mg total) by mouth every 4 (four) hours as needed for moderate pain (pain score 4-6) or severe pain (pain score 7-10).   polyethylene glycol 17 g packet Commonly known as: MIRALAX / GLYCOLAX Take 17 g by mouth daily as needed for mild constipation.   potassium chloride SA 20 MEQ tablet Commonly known as: KLOR-CON M Take 20 mEq by mouth daily as needed (With Furosemide).   predniSONE 10 MG tablet Commonly known as: DELTASONE Take 4 tablets (40 mg total) by mouth daily for 2 days, THEN 3 tablets (30 mg total) daily for 2 days, THEN 2 tablets (20 mg total) daily for 2 days, THEN 1 tablet (10 mg total) daily for 2 days, THEN 0.5 tablets (5 mg total) daily for 2 days. Start taking on: January 15, 2024 What changed:   medication strength See the new instructions.   senna 8.6 MG Tabs tablet Commonly known as: SENOKOT Take 1 tablet by mouth daily as needed for mild constipation.   simethicone 80 MG chewable tablet Commonly known as: MYLICON Chew 80 mg by mouth 4 (four) times daily as needed for flatulence.   sodium  chloride 0.65 % nasal spray Commonly known as: OCEAN Place 2 sprays into the nose every 6 (six) hours as needed for congestion.   Stiolto Respimat 2.5-2.5 MCG/ACT Aers Generic drug: Tiotropium Bromide-Olodaterol Inhale 2 each into the lungs 2 (two) times daily.          Allergies  Allergen Reactions   Zantac [Ranitidine Hcl] Shortness Of Breath    Eruption, HIVES, SHORTNESS OF BREATH, Eruption, HIVES, SHORTNESS OF BREATH    Molnupiravir Swelling   Codeine Itching    Other reaction(s): PRURITIS   Erythromycin Hives    Other reaction(s): SWELLING (NON-SPECIFIC), HIVES, SWELLING (NON-SPECIFIC), HIVES   Morphine Itching   Tiotropium Bromide Monohydrate    Hydrocodone Rash    Other reaction(s): PRURITIS     Subjective: pt reports breathing is good today, minimal cough, no chest pain   Discharge Exam: BP (!) 107/59 (BP Location: Left Arm)   Pulse 84   Temp 97.9 F (36.6 C) (Oral)   Resp 16   Ht 5\' 6"  (1.676 m)   Wt 77.1 kg   SpO2 100%   BMI 27.44 kg/m  General: Pt is alert, awake, not in acute distress Cardiovascular: RRR, S1/S2 +, no rubs, no gallops Respiratory: diminished breath sounds bilaterally  Abdominal: Soft, NT, ND, bowel sounds + Extremities: no edema, no cyanosis     The results of significant diagnostics from this hospitalization (including imaging, microbiology, ancillary and laboratory) are listed below for reference.     Microbiology: Recent Results (from the past 240 hours)  Resp panel by RT-PCR (RSV, Flu A&B, Covid) Anterior Nasal Swab     Status: None   Collection Time: 01/12/24  9:33 AM   Specimen: Anterior Nasal Swab  Result Value  Ref Range Status   SARS Coronavirus 2 by RT PCR NEGATIVE NEGATIVE Final    Comment: (NOTE) SARS-CoV-2 target nucleic acids are NOT DETECTED.  The SARS-CoV-2 RNA is generally detectable in upper respiratory specimens during the acute phase of infection. The lowest concentration of SARS-CoV-2 viral copies this assay can detect is 138 copies/mL. A negative result does not preclude SARS-Cov-2 infection and should not be used as the sole basis for treatment or other patient management decisions. A negative result may occur with  improper specimen collection/handling, submission of specimen other than nasopharyngeal swab, presence of viral mutation(s) within the areas targeted by this assay, and inadequate number of viral copies(<138 copies/mL). A negative result must be combined with clinical observations, patient history, and epidemiological information. The expected result is Negative.  Fact Sheet for Patients:  BloggerCourse.com  Fact Sheet for Healthcare Providers:  SeriousBroker.it  This test is no t yet approved or cleared by the [place] FDA and  has been authorized for detection and/or diagnosis of SARS-CoV-2 by FDA under an Emergency Use Authorization (EUA). This EUA will remain  in effect (meaning this test can be used) for the duration of the COVID-19 declaration under Section 564(b)(1) of the Act, 21 U.S.C.section 360bbb-3(b)(1), unless the authorization is terminated  or revoked sooner.       Influenza A by PCR NEGATIVE NEGATIVE Final   Influenza B by PCR NEGATIVE NEGATIVE Final    Comment: (NOTE) The Xpert Xpress SARS-CoV-2/FLU/RSV plus assay is intended as an aid in the diagnosis of influenza from Nasopharyngeal swab specimens and should not be used as a sole basis for treatment. Nasal washings and aspirates are unacceptable for Xpert Xpress SARS-CoV-2/FLU/RSV testing.  Fact Sheet for  Patients: BloggerCourse.com  Fact Sheet  for Healthcare Providers: SeriousBroker.it  This test is not yet approved or cleared by the [place] and has been authorized for detection and/or diagnosis of SARS-CoV-2 by FDA under an Emergency Use Authorization (EUA). This EUA will remain in effect (meaning this test can be used) for the duration of the COVID-19 declaration under Section 564(b)(1) of the Act, 21 U.S.C. section 360bbb-3(b)(1), unless the authorization is terminated or revoked.     Resp Syncytial Virus by PCR NEGATIVE NEGATIVE Final    Comment: (NOTE) Fact Sheet for Patients: BloggerCourse.com  Fact Sheet for Healthcare Providers: SeriousBroker.it  This test is not yet approved or cleared by the [place] FDA and has been authorized for detection and/or diagnosis of SARS-CoV-2 by FDA under an Emergency Use Authorization (EUA). This EUA will remain in effect (meaning this test can be used) for the duration of the COVID-19 declaration under Section 564(b)(1) of the Act, 21 U.S.C. section 360bbb-3(b)(1), unless the authorization is terminated or revoked.  Performed at [facility], [address] [place], [place] [address]      Labs: BNP (last 3 results) Recent Labs    01/12/24 0933  BNP 107.2*   Basic Metabolic Panel: Recent Labs  Lab 01/12/24 0933 01/13/24 0930 01/14/24 0553 01/15/24 0430  NA 136 132* 132* 130*  K 4.6 4.2 4.9 4.7  CL 96* 92* 91* 95*  CO2 28 27 29 26   GLUCOSE 127* 167* 119* 85  BUN 31* 40* 54* 53*  CREATININE 1.26* 1.34* 1.42* 1.28*  CALCIUM 9.1 9.4 9.1 8.9   Liver Function Tests: Recent Labs  Lab 01/12/24 0933  AST 16  ALT 17  ALKPHOS 62  BILITOT 0.5  PROT 6.9  ALBUMIN 3.8   No results for input(s): "LIPASE", "AMYLASE" in the last 168 hours. No results for input(s): "AMMONIA" in the last 168  hours. CBC: Recent Labs  Lab 01/12/24 0933 01/13/24 0930 01/14/24 0553  WBC 15.4* 12.4* 18.2*  HGB 13.3 13.2 13.6  HCT 43.3 42.1 42.7  MCV 90.8 89.6 87.5  PLT 273 261 260   Cardiac Enzymes: No results for input(s): "CKTOTAL", "CKMB", "CKMBINDEX", "TROPONINI" in the last 168 hours. BNP: Invalid input(s): "POCBNP" CBG: No results for input(s): "GLUCAP" in the last 168 hours. D-Dimer No results for input(s): "DDIMER" in the last 72 hours. Hgb A1c No results for input(s): "HGBA1C" in the last 72 hours. Lipid Profile No results for input(s): "CHOL", "HDL", "LDLCALC", "TRIG", "CHOLHDL", "LDLDIRECT" in the last 72 hours. Thyroid function studies No results for input(s): "TSH", "T4TOTAL", "T3FREE", "THYROIDAB" in the last 72 hours.  Invalid input(s): "FREET3" Anemia work up No results for input(s): "VITAMINB12", "FOLATE", "FERRITIN", "TIBC", "IRON", "RETICCTPCT" in the last 72 hours. Urinalysis    Component Value Date/Time   COLORURINE YELLOW (A) 08/17/2022 0254   APPEARANCEUR CLEAR (A) 08/17/2022 0254   APPEARANCEUR Clear 02/04/2013 1430   LABSPEC 1.011 08/17/2022 0254   LABSPEC 1.004 02/04/2013 1430   PHURINE 5.0 08/17/2022 0254   GLUCOSEU NEGATIVE 08/17/2022 0254   GLUCOSEU Negative 02/04/2013 1430   HGBUR NEGATIVE 08/17/2022 0254   BILIRUBINUR NEGATIVE 08/17/2022 0254   BILIRUBINUR Negative 02/04/2013 1430   KETONESUR NEGATIVE 08/17/2022 0254   PROTEINUR NEGATIVE 08/17/2022 0254   NITRITE NEGATIVE 08/17/2022 0254   LEUKOCYTESUR NEGATIVE 08/17/2022 0254   LEUKOCYTESUR Negative 02/04/2013 1430   Sepsis Labs Recent Labs  Lab 01/12/24 0933 01/13/24 0930 01/14/24 0553  WBC 15.4* 12.4* 18.2*   Microbiology Recent Results (from the past 240 hours)  Resp panel by RT-PCR (RSV, Flu A&B, Covid) Anterior Nasal Swab     Status: None   Collection Time: 01/12/24  9:33 AM   Specimen: Anterior Nasal Swab  Result Value Ref Range Status   SARS Coronavirus 2 by RT PCR  NEGATIVE NEGATIVE Final    Comment: (NOTE) SARS-CoV-2 target nucleic acids are NOT DETECTED.  The SARS-CoV-2 RNA is generally detectable in upper respiratory specimens during the acute phase of infection. The lowest concentration of SARS-CoV-2 viral copies this assay can detect is 138 copies/mL. A negative result does not preclude SARS-Cov-2 infection and should not be used as the sole basis for treatment or other patient management decisions. A negative result may occur with  improper specimen collection/handling, submission of specimen other than nasopharyngeal swab, presence of viral mutation(s) within the areas targeted by this assay, and inadequate number of viral copies(<138 copies/mL). A negative result must be combined with clinical observations, patient history, and epidemiological information. The expected result is Negative.  Fact Sheet for Patients:  BloggerCourse.com  Fact Sheet for Healthcare Providers:  SeriousBroker.it  This test is no t yet approved or cleared by the [place] FDA and  has been authorized for detection and/or diagnosis of SARS-CoV-2 by FDA under an Emergency Use Authorization (EUA). This EUA will remain  in effect (meaning this test can be used) for the duration of the COVID-19 declaration under Section 564(b)(1) of the Act, 21 U.S.C.section 360bbb-3(b)(1), unless the authorization is terminated  or revoked sooner.       Influenza A by PCR NEGATIVE NEGATIVE Final   Influenza B by PCR NEGATIVE NEGATIVE Final    Comment: (NOTE) The Xpert Xpress SARS-CoV-2/FLU/RSV plus assay is intended as an aid in the diagnosis of influenza from Nasopharyngeal swab specimens and should not be used as a sole basis for treatment. Nasal washings and aspirates are unacceptable for Xpert Xpress SARS-CoV-2/FLU/RSV testing.  Fact Sheet for Patients: BloggerCourse.com  Fact Sheet for  Healthcare Providers: SeriousBroker.it  This test is not yet approved or cleared by the [place] FDA and has been authorized for detection and/or diagnosis of SARS-CoV-2 by FDA under an Emergency Use Authorization (EUA). This EUA will remain in effect (meaning this test can be used) for the duration of the COVID-19 declaration under Section 564(b)(1) of the Act, 21 U.S.C. section 360bbb-3(b)(1), unless the authorization is terminated or revoked.     Resp Syncytial Virus by PCR NEGATIVE NEGATIVE Final    Comment: (NOTE) Fact Sheet for Patients: BloggerCourse.com  Fact Sheet for Healthcare Providers: SeriousBroker.it  This test is not yet approved or cleared by the [place] FDA and has been authorized for detection and/or diagnosis of SARS-CoV-2 by FDA under an Emergency Use Authorization (EUA). This EUA will remain in effect (meaning this test can be used) for the duration of the COVID-19 declaration under Section 564(b)(1) of the Act, 21 U.S.C. section 360bbb-3(b)(1), unless the authorization is terminated or revoked.  Performed at [facility], [address], [place], [place] [address]    Imaging DG Chest Portable 1 View Result Date: 01/12/2024 CLINICAL DATA:  Shortness of breath. EXAM: PORTABLE CHEST 1 VIEW COMPARISON:  08/23/2023. FINDINGS: Bilateral lungs appear hyperexpanded and hyperlucent with coarse bronchovascular markings, in keeping with COPD. There is paucity of markings in the bilateral upper/mid zones, compatible with bullous changes. Bilateral lungs otherwise appear clear. No dense consolidation or lung collapse. Bilateral costophrenic angles are clear. Normal cardio-mediastinal silhouette. No acute osseous abnormalities. The soft tissues are  within normal limits. IMPRESSION: *No active disease. COPD with bullous changes in the bilateral upper/mid lung zones. Electronically  Signed   By: Jules Schick M.D.   On: 01/12/2024 10:32      Time coordinating discharge: over 30 minutes  SIGNED:  Sunnie Nielsen DO [facility]

## 2024-01-15 NOTE — Progress Notes (Signed)
Initial Nutrition Assessment  DOCUMENTATION CODES:   Non-severe (moderate) malnutrition in context of chronic illness  INTERVENTION:  -Boost Plus po BID, each supplement provides 360 kcal and 14 grams of protein -MVI with minerals daily -Liberalize diet to 2g sodium diet to encourage adequate po intake  -Continue 1800 mL fluid restriction   NUTRITION DIAGNOSIS:   Moderate Malnutrition related to chronic illness (COPD) as evidenced by mild fat depletion, mild muscle depletion.  GOAL:   Patient will meet greater than or equal to 90% of their needs  MONITOR:   PO intake, Weight trends, Supplement acceptance  REASON FOR ASSESSMENT:   Consult Assessment of nutrition requirement/status  ASSESSMENT:   Pt presented to ED for chest pain and SOB, admitted for COPD exacerbation. PMH of afib, CAD s/p MI 30+ yrs ago no stent, COPD, DM2, HTN, chronic HF.  Pt reports appetite being good. He reports losing his sense of taste after having Covid back in November and has not gotten it back yet. He reports that although his taste has not returned, this has not affected his appetite or intake. However, he tends to prefer highly seasoned foods such as salty or sweet foods per his report of tasting them better. Pt was eating breakfast during visit and shares he has not been liking the meals much in the hospital due to them being cold upon delivery. Per chart review percent meals eaten is 50% today from breakfast. Pt reports eating out more than having home cooked meals. He shares that his wife works at [employer] and that she will bring home a bulk of food like biscuits, sausage and eggs in the beginning of the week and they will eat that for breakfast/lunch for the rest of the week. He reports having 2 cups of coffee in the morning and drinking sodas throughout the day. He reports that he will have a cookie or 2 after his [employer] meal and then does not eat until dinner. He reports having chicken,  hamburger helper, and packaged potatoes for dinner. He reports having Boost supplements at home but not regularly. He is open to trying Boost during his stay here. Discussed importance of adequate po/protein intake to support healing.  Mild edema in lower extremities could be masking further weight loss and muscle depletion.  Pt keeps organized log of his weights and other medical information in an app on his phone. Pt showed [place] his app where he has weights logged everyday for at least the past year. Per pt report and review of app, his usual weight has been around 171 lbs since November after he had Covid. Before Covid, his usual weight was around 180-189 lbs.   Pt reports getting around well at home and will sometimes use his walker but prefers not to. He lives at home with his wife.  Meds: Jardiance 10 mg daily, Folvite 2 mg, Senokot PRN At home meds: Metformin, Lasix  Labs: Sodium low & chloride low, Glucose 85-167  NUTRITION - FOCUSED PHYSICAL EXAM:  Flowsheet Row Most Recent Value  Orbital Region No depletion  Upper Arm Region Mild depletion  Thoracic and Lumbar Region No depletion  Buccal Region Mild depletion  Temple Region Mild depletion  Clavicle Bone Region No depletion  Clavicle and Acromion Bone Region Mild depletion  Scapular Bone Region No depletion  Dorsal Hand Mild depletion  Patellar Region Mild depletion  Anterior Thigh Region Mild depletion  Posterior Calf Region Mild depletion  Edema (RD Assessment) Mild  Hair Reviewed  Eyes  Reviewed  Mouth Reviewed  Skin Reviewed  Nails Reviewed       Diet Order:   Diet Order             Diet Heart Fluid consistency: Thin; Fluid restriction: 1800 mL Fluid  Diet effective now                   EDUCATION NEEDS:   Education needs have been addressed  Skin:  Skin Assessment: Reviewed RN Assessment  Last BM:  1/27  Height:   Ht Readings from Last 1 Encounters:  01/12/24 5\' 6"  (1.676 m)    Weight:    Wt Readings from Last 1 Encounters:  01/12/24 77.1 kg    Ideal Body Weight:  64.5 kg  BMI:  Body mass index is 27.44 kg/m.  Estimated Nutritional Needs:   Kcal:  2000-2200  Protein:  100-115 g  Fluid:  1.8 L    Maceo Pro, MS Dietetic Intern

## 2024-12-12 ENCOUNTER — Other Ambulatory Visit: Payer: Self-pay

## 2024-12-12 ENCOUNTER — Inpatient Hospital Stay
Admission: EM | Admit: 2024-12-12 | Discharge: 2024-12-18 | DRG: 871 | Disposition: A | Discharge status: Home / Self Care | Attending: Obstetrics and Gynecology | Admitting: Obstetrics and Gynecology

## 2024-12-12 ENCOUNTER — Emergency Department

## 2024-12-12 DIAGNOSIS — Z8249 Family history of ischemic heart disease and other diseases of the circulatory system: Secondary | ICD-10-CM

## 2024-12-12 DIAGNOSIS — I5032 Chronic diastolic (congestive) heart failure: Secondary | ICD-10-CM | POA: Diagnosis present

## 2024-12-12 DIAGNOSIS — J440 Chronic obstructive pulmonary disease with acute lower respiratory infection: Secondary | ICD-10-CM | POA: Diagnosis present

## 2024-12-12 DIAGNOSIS — J189 Pneumonia, unspecified organism: Secondary | ICD-10-CM | POA: Diagnosis present

## 2024-12-12 DIAGNOSIS — I2510 Atherosclerotic heart disease of native coronary artery without angina pectoris: Secondary | ICD-10-CM | POA: Diagnosis present

## 2024-12-12 DIAGNOSIS — R5381 Other malaise: Secondary | ICD-10-CM | POA: Diagnosis present

## 2024-12-12 DIAGNOSIS — Z9049 Acquired absence of other specified parts of digestive tract: Secondary | ICD-10-CM

## 2024-12-12 DIAGNOSIS — Z79899 Other long term (current) drug therapy: Secondary | ICD-10-CM | POA: Diagnosis not present

## 2024-12-12 DIAGNOSIS — I252 Old myocardial infarction: Secondary | ICD-10-CM

## 2024-12-12 DIAGNOSIS — K219 Gastro-esophageal reflux disease without esophagitis: Secondary | ICD-10-CM | POA: Diagnosis present

## 2024-12-12 DIAGNOSIS — Z7901 Long term (current) use of anticoagulants: Secondary | ICD-10-CM | POA: Diagnosis not present

## 2024-12-12 DIAGNOSIS — J441 Chronic obstructive pulmonary disease with (acute) exacerbation: Secondary | ICD-10-CM | POA: Diagnosis present

## 2024-12-12 DIAGNOSIS — Z9981 Dependence on supplemental oxygen: Secondary | ICD-10-CM

## 2024-12-12 DIAGNOSIS — I110 Hypertensive heart disease with heart failure: Secondary | ICD-10-CM | POA: Diagnosis present

## 2024-12-12 DIAGNOSIS — E872 Acidosis, unspecified: Secondary | ICD-10-CM | POA: Diagnosis present

## 2024-12-12 DIAGNOSIS — Z881 Allergy status to other antibiotic agents status: Secondary | ICD-10-CM

## 2024-12-12 DIAGNOSIS — Z6832 Body mass index (BMI) 32.0-32.9, adult: Secondary | ICD-10-CM

## 2024-12-12 DIAGNOSIS — E785 Hyperlipidemia, unspecified: Secondary | ICD-10-CM | POA: Diagnosis present

## 2024-12-12 DIAGNOSIS — E119 Type 2 diabetes mellitus without complications: Secondary | ICD-10-CM | POA: Diagnosis present

## 2024-12-12 DIAGNOSIS — Z7984 Long term (current) use of oral hypoglycemic drugs: Secondary | ICD-10-CM

## 2024-12-12 DIAGNOSIS — R6520 Severe sepsis without septic shock: Secondary | ICD-10-CM | POA: Diagnosis present

## 2024-12-12 DIAGNOSIS — Z888 Allergy status to other drugs, medicaments and biological substances status: Secondary | ICD-10-CM

## 2024-12-12 DIAGNOSIS — Z7902 Long term (current) use of antithrombotics/antiplatelets: Secondary | ICD-10-CM

## 2024-12-12 DIAGNOSIS — J439 Emphysema, unspecified: Secondary | ICD-10-CM | POA: Diagnosis present

## 2024-12-12 DIAGNOSIS — J159 Unspecified bacterial pneumonia: Secondary | ICD-10-CM | POA: Diagnosis present

## 2024-12-12 DIAGNOSIS — J9621 Acute and chronic respiratory failure with hypoxia: Secondary | ICD-10-CM | POA: Diagnosis present

## 2024-12-12 DIAGNOSIS — I480 Paroxysmal atrial fibrillation: Secondary | ICD-10-CM | POA: Diagnosis present

## 2024-12-12 DIAGNOSIS — Z7983 Long term (current) use of bisphosphonates: Secondary | ICD-10-CM

## 2024-12-12 DIAGNOSIS — A419 Sepsis, unspecified organism: Principal | ICD-10-CM | POA: Diagnosis present

## 2024-12-12 DIAGNOSIS — I4891 Unspecified atrial fibrillation: Secondary | ICD-10-CM | POA: Diagnosis present

## 2024-12-12 DIAGNOSIS — K449 Diaphragmatic hernia without obstruction or gangrene: Secondary | ICD-10-CM | POA: Diagnosis present

## 2024-12-12 DIAGNOSIS — E66811 Obesity, class 1: Secondary | ICD-10-CM | POA: Diagnosis present

## 2024-12-12 DIAGNOSIS — Z87891 Personal history of nicotine dependence: Secondary | ICD-10-CM

## 2024-12-12 DIAGNOSIS — Z885 Allergy status to narcotic agent status: Secondary | ICD-10-CM

## 2024-12-12 LAB — CBC WITH DIFFERENTIAL/PLATELET
Abs Immature Granulocytes: 0.49 K/uL — ABNORMAL HIGH (ref 0.00–0.07)
Basophils Absolute: 0.1 K/uL (ref 0.0–0.1)
Basophils Relative: 0 %
Eosinophils Absolute: 0 K/uL (ref 0.0–0.5)
Eosinophils Relative: 0 %
HCT: 49.7 % (ref 39.0–52.0)
Hemoglobin: 15.5 g/dL (ref 13.0–17.0)
Immature Granulocytes: 2 %
Lymphocytes Relative: 11 %
Lymphs Abs: 3.6 K/uL (ref 0.7–4.0)
MCH: 29.5 pg (ref 26.0–34.0)
MCHC: 31.2 g/dL (ref 30.0–36.0)
MCV: 94.5 fL (ref 80.0–100.0)
Monocytes Absolute: 1.6 K/uL — ABNORMAL HIGH (ref 0.1–1.0)
Monocytes Relative: 5 %
Neutro Abs: 26.3 K/uL — ABNORMAL HIGH (ref 1.7–7.7)
Neutrophils Relative %: 82 %
Platelets: 270 K/uL (ref 150–400)
RBC: 5.26 MIL/uL (ref 4.22–5.81)
RDW: 16.3 % — ABNORMAL HIGH (ref 11.5–15.5)
Smear Review: NORMAL
WBC: 32.1 K/uL — ABNORMAL HIGH (ref 4.0–10.5)
nRBC: 0 % (ref 0.0–0.2)

## 2024-12-12 LAB — COMPREHENSIVE METABOLIC PANEL WITH GFR
ALT: 14 U/L (ref 0–44)
AST: 21 U/L (ref 15–41)
Albumin: 4.3 g/dL (ref 3.5–5.0)
Alkaline Phosphatase: 117 U/L (ref 38–126)
Anion gap: 13 (ref 5–15)
BUN: 18 mg/dL (ref 8–23)
CO2: 25 mmol/L (ref 22–32)
Calcium: 8.8 mg/dL — ABNORMAL LOW (ref 8.9–10.3)
Chloride: 100 mmol/L (ref 98–111)
Creatinine, Ser: 1.2 mg/dL (ref 0.61–1.24)
GFR, Estimated: >60 mL/min
Glucose, Bld: 153 mg/dL — ABNORMAL HIGH (ref 70–99)
Potassium: 4.4 mmol/L (ref 3.5–5.1)
Sodium: 137 mmol/L (ref 135–145)
Total Bilirubin: 0.9 mg/dL (ref 0.0–1.2)
Total Protein: 6.9 g/dL (ref 6.5–8.1)

## 2024-12-12 LAB — BLOOD GAS, VENOUS
Acid-Base Excess: 0.8 mmol/L (ref 0.0–2.0)
Bicarbonate: 28.5 mmol/L — ABNORMAL HIGH (ref 20.0–28.0)
O2 Saturation: 63 %
Patient temperature: 37
pCO2, Ven: 58 mmHg (ref 44–60)
pH, Ven: 7.3 (ref 7.25–7.43)
pO2, Ven: 40 mmHg (ref 32–45)

## 2024-12-12 LAB — LACTIC ACID, PLASMA: Lactic Acid, Venous: 2.5 mmol/L (ref 0.5–1.9)

## 2024-12-12 LAB — RESP PANEL BY RT-PCR (RSV, FLU A&B, COVID)  RVPGX2
Influenza A by PCR: NEGATIVE
Influenza B by PCR: NEGATIVE
Resp Syncytial Virus by PCR: NEGATIVE
SARS Coronavirus 2 by RT PCR: NEGATIVE

## 2024-12-12 LAB — LIPASE, BLOOD: Lipase: 26 U/L (ref 11–51)

## 2024-12-12 LAB — PRO BRAIN NATRIURETIC PEPTIDE: Pro Brain Natriuretic Peptide: 1055 pg/mL — ABNORMAL HIGH

## 2024-12-12 LAB — PROCALCITONIN: Procalcitonin: 9.91 ng/mL

## 2024-12-12 MED ORDER — UMECLIDINIUM BROMIDE 62.5 MCG/ACT IN AEPB
1.0000 | INHALATION_SPRAY | Freq: Every day | RESPIRATORY_TRACT | Status: DC
  Administered 2024-12-13 – 2024-12-18 (×6): 1 via RESPIRATORY_TRACT
  Filled 2024-12-12 (×2): qty 7

## 2024-12-12 MED ORDER — SIMETHICONE 80 MG PO CHEW: 80.0000 mg | CHEWABLE_TABLET | Freq: Four times a day (QID) | ORAL | Status: DC | PRN

## 2024-12-12 MED ORDER — ALBUTEROL SULFATE (2.5 MG/3ML) 0.083% IN NEBU
5.0000 mg | INHALATION_SOLUTION | Freq: Once | RESPIRATORY_TRACT | Status: AC
  Administered 2024-12-12: 5 mg via RESPIRATORY_TRACT
  Filled 2024-12-12: qty 6

## 2024-12-12 MED ORDER — MELATONIN 5 MG PO TABS
5.0000 mg | ORAL_TABLET | Freq: Every day | ORAL | Status: DC
  Administered 2024-12-12 – 2024-12-17 (×6): 5 mg via ORAL
  Filled 2024-12-12 (×3): qty 1

## 2024-12-12 MED ORDER — PANTOPRAZOLE SODIUM 40 MG PO TBEC
40.0000 mg | DELAYED_RELEASE_TABLET | Freq: Every day | ORAL | Status: DC
  Administered 2024-12-12 – 2024-12-18 (×7): 40 mg via ORAL
  Filled 2024-12-12 (×3): qty 1

## 2024-12-12 MED ORDER — METHYLPREDNISOLONE SODIUM SUCC 40 MG IJ SOLR
40.0000 mg | Freq: Two times a day (BID) | INTRAMUSCULAR | Status: DC
  Administered 2024-12-12 – 2024-12-15 (×7): 40 mg via INTRAVENOUS
  Filled 2024-12-12 (×6): qty 1

## 2024-12-12 MED ORDER — METOPROLOL SUCCINATE ER 100 MG PO TB24
100.0000 mg | ORAL_TABLET | Freq: Every day | ORAL | Status: DC
  Administered 2024-12-12 – 2024-12-16 (×5): 100 mg via ORAL
  Filled 2024-12-12: qty 2
  Filled 2024-12-12 (×2): qty 1

## 2024-12-12 MED ORDER — LIDOCAINE 5 % EX PTCH
1.0000 | MEDICATED_PATCH | CUTANEOUS | Status: AC
  Administered 2024-12-12: 1 via TRANSDERMAL
  Filled 2024-12-12: qty 1

## 2024-12-12 MED ORDER — ARFORMOTEROL TARTRATE 15 MCG/2ML IN NEBU
15.0000 ug | INHALATION_SOLUTION | Freq: Two times a day (BID) | RESPIRATORY_TRACT | Status: DC
  Administered 2024-12-12 – 2024-12-18 (×12): 15 ug via RESPIRATORY_TRACT
  Filled 2024-12-12 (×9): qty 2

## 2024-12-12 MED ORDER — OXYCODONE HCL 5 MG PO TABS
5.0000 mg | ORAL_TABLET | Freq: Four times a day (QID) | ORAL | Status: DC | PRN
  Filled 2024-12-12: qty 1

## 2024-12-12 MED ORDER — LABETALOL HCL 5 MG/ML IV SOLN
10.0000 mg | INTRAVENOUS | Status: DC | PRN
  Administered 2024-12-12: 10 mg via INTRAVENOUS
  Filled 2024-12-12: qty 4

## 2024-12-12 MED ORDER — ACETAMINOPHEN 325 MG PO TABS
650.0000 mg | ORAL_TABLET | Freq: Four times a day (QID) | ORAL | Status: AC | PRN
  Administered 2024-12-12 – 2024-12-13 (×3): 650 mg via ORAL
  Filled 2024-12-12 (×3): qty 2

## 2024-12-12 MED ORDER — FENTANYL CITRATE (PF) 50 MCG/ML IJ SOSY: 25.0000 ug | PREFILLED_SYRINGE | Freq: Once | INTRAMUSCULAR | Status: DC

## 2024-12-12 MED ORDER — FENTANYL CITRATE (PF) 50 MCG/ML IJ SOSY
50.0000 ug | PREFILLED_SYRINGE | Freq: Once | INTRAMUSCULAR | Status: AC
  Administered 2024-12-12: 50 ug via INTRAVENOUS
  Filled 2024-12-12: qty 1

## 2024-12-12 MED ORDER — ATORVASTATIN CALCIUM 20 MG PO TABS
40.0000 mg | ORAL_TABLET | Freq: Every day | ORAL | Status: DC
  Administered 2024-12-12 – 2024-12-17 (×6): 40 mg via ORAL
  Filled 2024-12-12 (×3): qty 2

## 2024-12-12 MED ORDER — POLYETHYLENE GLYCOL 3350 17 G PO PACK: 17.0000 g | PACK | Freq: Every day | ORAL | Status: DC | PRN

## 2024-12-12 MED ORDER — MAGNESIUM SULFATE IN D5W 1-5 GM/100ML-% IV SOLN: 1.0000 g | Freq: Once | INTRAVENOUS | Status: DC

## 2024-12-12 MED ORDER — DABIGATRAN ETEXILATE MESYLATE 150 MG PO CAPS
150.0000 mg | ORAL_CAPSULE | Freq: Two times a day (BID) | ORAL | Status: DC
  Administered 2024-12-12 – 2024-12-18 (×13): 150 mg via ORAL
  Filled 2024-12-12 (×7): qty 1

## 2024-12-12 MED ORDER — DOXYCYCLINE HYCLATE 100 MG PO TABS
100.0000 mg | ORAL_TABLET | Freq: Two times a day (BID) | ORAL | Status: AC
  Administered 2024-12-12 – 2024-12-16 (×10): 100 mg via ORAL
  Filled 2024-12-12 (×6): qty 1

## 2024-12-12 MED ORDER — SODIUM CHLORIDE 0.9 % IV SOLN
2.0000 g | Freq: Once | INTRAVENOUS | Status: AC
  Administered 2024-12-12: 2 g via INTRAVENOUS
  Filled 2024-12-12: qty 12.5

## 2024-12-12 MED ORDER — SODIUM CHLORIDE 0.9 % IV SOLN
2.0000 g | INTRAVENOUS | Status: AC
  Administered 2024-12-13 – 2024-12-17 (×5): 2 g via INTRAVENOUS
  Filled 2024-12-12 (×2): qty 20

## 2024-12-12 MED ORDER — VANCOMYCIN HCL 2000 MG/400ML IV SOLN
2000.0000 mg | Freq: Once | INTRAVENOUS | Status: AC
  Administered 2024-12-12: 2000 mg via INTRAVENOUS
  Filled 2024-12-12: qty 400

## 2024-12-12 MED ORDER — VANCOMYCIN HCL IN DEXTROSE 1-5 GM/200ML-% IV SOLN: 1000.0000 mg | Freq: Once | INTRAVENOUS | Status: DC

## 2024-12-12 MED ORDER — FLUTICASONE PROPIONATE 50 MCG/ACT NA SUSP
2.0000 | Freq: Every day | NASAL | Status: DC | PRN
  Filled 2024-12-12: qty 16

## 2024-12-12 MED ORDER — GABAPENTIN 300 MG PO CAPS
600.0000 mg | ORAL_CAPSULE | Freq: Three times a day (TID) | ORAL | Status: DC
  Administered 2024-12-12 (×2): 600 mg via ORAL
  Administered 2024-12-13: 300 mg via ORAL
  Filled 2024-12-12 (×3): qty 2

## 2024-12-12 MED ORDER — LACTATED RINGERS IV BOLUS (SEPSIS)
1000.0000 mL | Freq: Once | INTRAVENOUS | Status: AC
  Administered 2024-12-12: 1000 mL via INTRAVENOUS

## 2024-12-12 MED ORDER — QUETIAPINE FUMARATE 25 MG PO TABS
12.5000 mg | ORAL_TABLET | Freq: Once | ORAL | Status: AC
  Administered 2024-12-12: 12.5 mg via ORAL
  Filled 2024-12-12: qty 1

## 2024-12-12 MED ORDER — ONDANSETRON HCL 4 MG/2ML IJ SOLN
4.0000 mg | INTRAMUSCULAR | Status: AC
  Administered 2024-12-12: 4 mg via INTRAVENOUS
  Filled 2024-12-12: qty 2

## 2024-12-12 MED ORDER — LEVALBUTEROL HCL 0.63 MG/3ML IN NEBU
0.6300 mg | INHALATION_SOLUTION | Freq: Four times a day (QID) | RESPIRATORY_TRACT | Status: DC
  Administered 2024-12-12 – 2024-12-14 (×7): 0.63 mg via RESPIRATORY_TRACT
  Filled 2024-12-12 (×9): qty 3

## 2024-12-12 MED ORDER — GUAIFENESIN ER 600 MG PO TB12: 600.0000 mg | ORAL_TABLET | Freq: Two times a day (BID) | ORAL | Status: DC | PRN

## 2024-12-12 NOTE — ED Provider Notes (Signed)
 "  [facility] Provider Note    Event Date/Time   First MD Initiated Contact with Patient 12/12/24 [phone]     (approximate)   History   Shortness of Breath   HPI Darius Norris is a 73 y.o. male with a history of COPD on 3 L of oxygen at baseline who presents by EMS for evaluation of severe shortness of breath.  When they arrived they found that he was 80% on his chronic 3 L.  They put him on CPAP and gave Solu-Medrol  125 mg IV and 1 albuterol  treatment.  Upon arrival to the ED he is still in moderate respiratory distress and was placed on BiPAP.  He reports pain in his right shoulder and he is anxious about having able to breathe.  He denies a recent fall and denies passing out.  Other than the right shoulder pain he is not having any pain but he still feels very short of breath and said the symptoms have been gradually worsening for a couple of days     Physical Exam   ED Triage Vitals  Encounter Vitals Group     BP 12/12/24 0602 128/69     Girls Systolic BP Percentile --      Girls Diastolic BP Percentile --      Boys Systolic BP Percentile --      Boys Diastolic BP Percentile --      Pulse Rate 12/12/24 0540 (!) 113     Resp 12/12/24 0540 (!) 30     Temp 12/12/24 0540 98.6 F (37 C)     Temp Source 12/12/24 0540 Oral     SpO2 12/12/24 0540 95 %     Weight 12/12/24 0545 80.2 kg (176 lb 12.9 oz)     Height 12/12/24 0545 1.575 m (5' 2)     Head Circumference --      Peak Flow --      Pain Score 12/12/24 0541 5     Pain Loc --      Pain Education --      Exclude from Growth Chart --       Most recent vital signs: Vitals:   12/12/24 0602 12/12/24 0630  BP: 128/69 137/64  Pulse: (!) 111 (!) 118  Resp: (!) 27 (!) 31  Temp:    SpO2: 95% 95%    General: awake and alert, answering questions, moderate respiratory distress CV:  Good peripheral perfusion.  Tachycardia Resp:  Moderate respiratory distress with accessory muscle usage and  intercostal retractions.  Tight and diminished lung sounds throughout.  Tachypneic.  No wheezing, but with minimal air movement. Abd:  No distention.  No tenderness to palpation of the abdomen.   ED Results / Procedures / Treatments   Labs (all labs ordered are listed, but only abnormal results are displayed) Labs Reviewed  COMPREHENSIVE METABOLIC PANEL WITH GFR - Abnormal; Notable for the following components:      Result Value   Glucose, Bld 153 (*)    Calcium  8.8 (*)    All other components within normal limits  CBC WITH DIFFERENTIAL/PLATELET - Abnormal; Notable for the following components:   WBC 32.1 (*)    RDW 16.3 (*)    Neutro Abs 26.3 (*)    Monocytes Absolute 1.6 (*)    Abs Immature Granulocytes 0.49 (*)    All other components within normal limits  PRO BRAIN NATRIURETIC PEPTIDE - Abnormal; Notable for the following components:   Pro  Brain Natriuretic Peptide 1,055.0 (*)    All other components within normal limits  BLOOD GAS, VENOUS - Abnormal; Notable for the following components:   Bicarbonate 28.5 (*)    All other components within normal limits  CULTURE, BLOOD (SINGLE)  RESP PANEL BY RT-PCR (RSV, FLU A&B, COVID)  [id number]  CULTURE, BLOOD (SINGLE)  LIPASE, BLOOD  LACTIC ACID, PLASMA  LACTIC ACID, PLASMA     EKG  ED ECG REPORT I, Darleene Dome, the attending physician, personally viewed and interpreted this ECG.  Date: 12/12/2024 EKG Time: 5:44 AM Rate: 113 Rhythm:  sinus tachycardia QRS Axis: normal Intervals: normal ST/T Wave abnormalities: Non-specific ST segment / T-wave changes, but no clear evidence of acute ischemia. Narrative Interpretation: Heavy artifact is present due to patient movement, will repeat  ED ECG REPORT I, Darleene Dome, the attending physician, personally viewed and interpreted this ECG.  Date: 12/12/2024 EKG Time: 5:47 AM Rate: 113 Rhythm: Sinus tachycardia QRS Axis: normal Intervals: normal ST/T Wave abnormalities:  Non-specific ST segment / T-wave changes, but no clear evidence of acute ischemia. Narrative Interpretation: no definitive evidence of acute ischemia; does not meet STEMI criteria.    RADIOLOGY See ED course for details   PROCEDURES:  Critical Care performed: Yes, see critical care procedure note(s)  .Critical Care  Performed by: Dome Darleene, MD Authorized by: Dome Darleene, MD   Critical care provider statement:    Critical care time (minutes):  30   Critical care time was exclusive of:  Separately billable procedures and treating other patients   Critical care was necessary to treat or prevent imminent or life-threatening deterioration of the following conditions:  Respiratory failure   Critical care was time spent personally by me on the following activities:  Development of treatment plan with patient or surrogate, evaluation of patient's response to treatment, examination of patient, obtaining history from patient or surrogate, ordering and performing treatments and interventions, ordering and review of laboratory studies, ordering and review of radiographic studies, pulse oximetry, re-evaluation of patient's condition and review of old charts .1-3 Lead EKG Interpretation  Performed by: Dome Darleene, MD Authorized by: Dome Darleene, MD     Interpretation: abnormal     ECG rate:  113   ECG rate assessment: tachycardic     Rhythm: sinus tachycardia     Ectopy: none     Conduction: normal       IMPRESSION / MDM / ASSESSMENT AND PLAN / ED COURSE  I reviewed the triage vital signs and the nursing notes.                              Differential diagnosis includes, but is not limited to, COPD exacerbation, CHF, ACS, PE, pneumonia, viral illness  Patient's presentation is most consistent with acute presentation with potential threat to life or bodily function.  Labs/studies ordered: VBG, CBC with differential, proBNP, lipase, CMP, blood culture, respiratory viral panel,  chest x-ray, EKG  Interventions/Medications given:  Medications  lactated ringers  bolus 1,000 mL (1,000 mLs Intravenous New Bag/Given 12/12/24 0718)  ceFEPIme  (MAXIPIME ) 2 g in sodium chloride  0.9 % 100 mL IVPB (2 g Intravenous New Bag/Given 12/12/24 0715)  vancomycin  (VANCOREADY) IVPB 2000 mg/400 mL (has no administration in time range)  fentaNYL  (SUBLIMAZE ) injection 50 mcg (has no administration in time range)  albuterol  (PROVENTIL ) (2.5 MG/3ML) 0.083% nebulizer solution 5 mg (5 mg Nebulization Given 12/12/24 0557)  ondansetron  (ZOFRAN ) injection 4  mg (4 mg Intravenous Given 12/12/24 0549)  fentaNYL  (SUBLIMAZE ) injection 50 mcg (50 mcg Intravenous Given 12/12/24 0550)    (Note:  hospital course my include additional interventions and/or labs/studies not listed above.)   Patient hypoxic over his baseline (acute on chronic respiratory failure with hypoxia), tachycardic, tachypneic.  Suspect COPD but may have superimposed infection.  Providing additional albuterol  nebulizer treatments with the BiPAP (patient allegedly has an allergy to DuoNebs) and he already received Solu-Medrol .  Will monitor carefully.  The patient is on the cardiac monitor to evaluate for evidence of arrhythmia and/or significant heart rate changes.   Clinical Course as of 12/12/24 0725  Fri Dec 12, 2024  0613 Blood gas, venous(!) Normal VBG without evidence of CO2 retention [CF]  0650 Patient has a leukocytosis of 32, proBNP of just over thousand and, and negative respiratory viral panel.  This is a mixed picture of probable infection as well as possible CHF exacerbation [CF]  0653 DG Chest Port 1 View I independently viewed and interpret the patient's chest x-ray and I am worried that he has right lower lobe pneumonia.  There is not obviously fluid.  I am making him code sepsis and treating with cefepime  2 g IV and vancomycin  given his chronic COPD and I feel he needs broader evaluation (he also reportedly has an  allergy to azithromycin ).  I am ordering 1 L LR while a lactic acid is pending because of his insensible losses from the tachypnea and because of his pneumonia.  However, I am concerned about the fact that his proBNP is about 1000 although I have no values against which to compare.  30 mL/kg of IV fluid should be approached very carefully and I will start off with 1 L. [CF]  9344 Consulting the hospitalist team for admission. [CF]  [phone] Transferring ED care to Dr. Ernest to discuss with hospitalist [CF]    Clinical Course User Index [CF] Gordan Huxley, MD     FINAL CLINICAL IMPRESSION(S) / ED DIAGNOSES   Final diagnoses:  Sepsis with acute hypoxic respiratory failure without septic shock, due to unspecified organism [facility])  COPD exacerbation (HCC)     Rx / DC Orders   ED Discharge Orders     None        Note:  This document was prepared using Dragon voice recognition software and may include unintentional dictation errors.   Gordan Huxley, MD 12/12/24 [phone]  "

## 2024-12-12 NOTE — ED Triage Notes (Signed)
 Pt from home via [facility] for SOB. Pt arrived on cpap with EMS. Pt has a history of CHF and COPD. Pt is on 3L [place] at baseline.   EMS gave pt solumedrol and an albuterol  treatment. Pt working to breath upon arrival.   RT called to bedside to set up pt on BiPAP.

## 2024-12-12 NOTE — Progress Notes (Signed)
 Pt is feeling very anxious and cannot tolerate the bipap pt states Dr came in and told him his pressures were to high on the machine.  Pt is on 10/5  pt was placed on 5L/[place], however, pt's anxiety did not improve

## 2024-12-12 NOTE — Progress Notes (Signed)
 CODE SEPSIS - PHARMACY COMMUNICATION  **Broad Spectrum Antibiotics should be administered within 1 hour of Sepsis diagnosis**  Time Code Sepsis Called/Page Received: 9347  Antibiotics Ordered: cefepime   Time of 1st antibiotic administration: 0715  Additional action taken by pharmacy:    If necessary, Name of Provider/Nurse Contacted:      Suzann Allean LABOR ,PharmD Clinical Pharmacist  12/12/2024  7:35 AM

## 2024-12-12 NOTE — ED Notes (Signed)
 Fall bundle precautions in place

## 2024-12-12 NOTE — Progress Notes (Signed)
 Elink is following code sepsis.

## 2024-12-12 NOTE — H&P (Signed)
 " History and Physical    Darius Norris:[id number] DOB: Apr 04, 1951 DOA: 12/12/2024  PCP: [facility], D[facility] (Confirm with patient/family/NH records and if not entered, this has to be entered at [facility] point of entry) Patient coming from: Home  I have personally briefly reviewed patient's old Norris records in [facility]  Chief Complaint: Chest pain, cough, SOB  HPI: Darius Norris is a 73 y.o. 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  Symptoms started 4 weeks ago when patient started to develop wheezing productive cough with occasional coughing up yellowish phlegm, last few days patient also started develop right-sided chest pain, worsening with cough and deep breath, he has no fever or chills.  Denies any cough or choking after eating or drinking no swallowing difficulties.  No recent weight loss.  Last night, patient became more shortness of breath and could not sleep, and family called EMS.  EMS arrived and found patient more hypoxic and gave patient Solu-Medrol  and albuterol  treatment.  ED Course: Afebrile, tachycardia heart rate in the 110s, tachypneic blood pressure 124/81 O2 saturation 99% on BiPAP 15/840%.  Chest x-ray showed right-sided lower field infiltrates.  CT chest without contrast showed right middle lobe infiltrates and consolidation.  Blood work showed WBC 32 with left shift, WBC morphology unremarkable,, lactic acid 2.5, proBNP 1000,  Patient was given IV fentanyl , cefepime  and LR 1000 mL x 1.  Review of Systems: As per HPI otherwise 14 point review of systems negative.    Past Norris History:  Diagnosis Date   A-fib [facility])    CAD (coronary artery disease)    Colitis    COPD (chronic obstructive pulmonary disease) (HCC)    Diabetes mellitus without complication (HCC)    Emphysema lung  (HCC)    GERD (gastroesophageal reflux disease)    Hypertension    MI (myocardial infarction) (HCC) 1999    Past Surgical History:  Procedure Laterality Date   ABDOMINAL HERNIA REPAIR Bilateral    CHOLECYSTECTOMY     COLONOSCOPY WITH PROPOFOL  N/A 02/01/2018   Procedure: COLONOSCOPY WITH PROPOFOL ;  Surgeon: Therisa Bi, MD;  Location: [facility] ENDOSCOPY;  Service: Gastroenterology;  Laterality: N/A;   KNEE ARTHROSCOPY Bilateral    LEFT HEART CATH AND CORONARY ANGIOGRAPHY N/A 01/03/2023   Procedure: LEFT HEART CATH AND CORONARY ANGIOGRAPHY and possible pci and stent;  Surgeon: Ammon Blunt, MD;  Location: [facility] INVASIVE CV LAB;  Service: Cardiovascular;  Laterality: N/A;     reports that he quit smoking about 23 years ago. His smoking use included cigarettes. He started smoking about 53 years ago. He has a 30 pack-year smoking history. He has never used smokeless tobacco. He reports that he does not drink alcohol and does not use drugs.  Allergies[1]  Family History  Problem Relation Age of Onset   CAD Father    Stroke Father    CAD Sister    Stroke Brother      Prior to Admission medications  Medication Sig Start Date End Date Taking? Authorizing Provider  acetaminophen  (TYLENOL ) 325 MG tablet Take 975 mg by mouth 3 (three) times daily.   Yes [provider]  alendronate (FOSAMAX) 70 MG tablet Take 70 mg by mouth once a week. 09/24/24  Yes [provider]  atorvastatin  (LIPITOR ) 80 MG tablet Take 0.5 tablets (40 mg total) by  mouth at bedtime. Half of an 12 01/04/23  Yes Josette Ade, MD  Cholecalciferol 50 MCG (2000 UT) TABS Take 50 mcg by mouth 2 (two) times daily with a meal. 09/24/24  Yes [provider]  dabigatran  (PRADAXA ) 150 MG CAPS capsule Take 150 mg by mouth 2 (two) times daily.   Yes [provider]  diclofenac  Sodium (VOLTAREN ) 1 % GEL Apply 4 g topically 4 (four) times daily. 04/10/24  Yes [provider]  empagliflozin   (JARDIANCE ) 25 MG TABS tablet Take 12.5 mg by mouth daily. Take 0.5 tablet (12.5 mg) by mouth once daily   Yes [provider]  ferrous sulfate 325 (65 FE) MG EC tablet Take 325 mg by mouth daily with breakfast.   Yes [provider]  fluticasone  (FLONASE ) 50 MCG/ACT nasal spray Place 2 sprays into both nostrils daily as needed. 09/03/23  Yes [provider]  folic acid  (FOLVITE ) 1 MG tablet Take 1 mg by mouth daily. Hold on Sunday when taking Methotrexate  03/22/23  Yes [provider]  furosemide  (LASIX ) 40 MG tablet Take 0.5 tablets (20 mg total) by mouth daily. Take 1 tablet (20 mg total) by mouth TWICE daily (total daily dose 40 mg) as needed for up to 3 days for increased leg swelling, shortness of breath, weight gain 5+ lbs over 1-2 days. Seek Norris care if these symptoms are not improving with increased dose. 01/15/24  Yes Alexander, Natalie, DO  gabapentin  (NEURONTIN ) 300 MG capsule Take 600 mg by mouth 3 (three) times daily. 07/26/23  Yes [provider]  guaifenesin  (HUMIBID E) 400 MG TABS tablet Take 400 mg by mouth every 4 (four) hours as needed.   Yes [provider]  hydrocortisone  2.5 % lotion Apply 1 application  topically 2 (two) times daily. 05/15/24  Yes [provider]  isosorbide  mononitrate (IMDUR ) 60 MG 24 hr tablet Take 60 mg by mouth daily.   Yes [provider]  lidocaine  (LIDODERM ) 5 % Place 1 patch onto the skin daily as needed. Remove & Discard patch within 12 hours or as directed by MD   Yes [provider]  Melatonin 3 MG CAPS Take 2 capsules by mouth at bedtime. 10/10/21  Yes [provider]  metoprolol  (TOPROL -XL) 200 MG 24 hr tablet Take 100 mg by mouth daily. 07/24/24  Yes [provider]  naloxone (NARCAN) nasal spray 4 mg/0.1 mL Place 1 spray into the nose once. 12/01/23  Yes [provider]  nitroGLYCERIN  (NITROGLYN) 2 % ointment Apply 1 inch topically as needed for  chest pain. 01/25/24  Yes [provider]  omeprazole (PRILOSEC) 20 MG capsule Take 20 mg by mouth daily.   Yes [provider]  polyethylene glycol (MIRALAX  / GLYCOLAX ) 17 g packet Take 17 g by mouth daily as needed for mild constipation.   Yes [provider]  potassium chloride  SA (KLOR-CON  M) 20 MEQ tablet Take 20 mEq by mouth daily as needed (With Furosemide ). 03/22/23  Yes [provider]  senna (SENOKOT) 8.6 MG TABS tablet Take 1 tablet by mouth daily as needed for mild constipation.   Yes [provider]  simethicone  (MYLICON) 80 MG chewable tablet Chew 80 mg by mouth 4 (four) times daily as needed for flatulence. 03/04/22  Yes [provider]  sodium chloride  (OCEAN) 0.65 % nasal spray Place 2 sprays into the nose every 6 (six) hours as needed for congestion.   Yes [provider]  Tiotropium Bromide-Olodaterol (STIOLTO  RESPIMAT) 2.5-2.5 MCG/ACT AERS Inhale 2 each into the lungs 2 (two) times daily.   Yes [provider]  triamcinolone cream (KENALOG) 0.1 % Apply 1 Application topically 2 (two) times daily. 05/15/24  Yes [provider]  methotrexate  (RHEUMATREX) 2.5 MG tablet Take 3 tablets (7.5 mg total) by mouth once a week AND 2 tablets (5 mg total) once a week. Caution:Chemotherapy. Protect from light.. 01/20/24   Alexander, Natalie, DO  oxyCODONE  (OXY IR/ROXICODONE ) 5 MG immediate release tablet Take 1 tablet (5 mg total) by mouth every 4 (four) hours as needed for moderate pain (pain score 4-6) or severe pain (pain score 7-10). Patient not taking: Reported on 12/12/2024 01/15/24   Marsa Edelman, DO    Physical Exam: Vitals:   12/12/24 0705 12/12/24 0730 12/12/24 0800 12/12/24 0851  BP:  125/68 (!) 140/67   Pulse: (!) 114 (!) 111 (!) 102 (!) 107  Resp: (!) 29 (!) 27 (!) 29 (!) 34  Temp:      TempSrc:      SpO2:      Weight:      Height:        Constitutional: NAD, calm, comfortable Vitals:    12/12/24 0705 12/12/24 0730 12/12/24 0800 12/12/24 0851  BP:  125/68 (!) 140/67   Pulse: (!) 114 (!) 111 (!) 102 (!) 107  Resp: (!) 29 (!) 27 (!) 29 (!) 34  Temp:      TempSrc:      SpO2:      Weight:      Height:       Eyes: PERRL, lids and conjunctivae normal ENMT: Mucous membranes are moist. Posterior pharynx clear of any exudate or lesions.Normal dentition.  Neck: normal, supple, no masses, no thyromegaly Respiratory: Diminished breathing sound on the right side, coarse crackles predominant on the right side, scattered wheezing bilaterally, increasing respiratory effort. No accessory muscle use.  Cardiovascular: Regular rate and rhythm, no murmurs / rubs / gallops. No extremity edema. 2+ pedal pulses. No carotid bruits.  Abdomen: no tenderness, no masses palpated. No hepatosplenomegaly. Bowel sounds positive.  Musculoskeletal: no clubbing / cyanosis. No joint deformity upper and lower extremities. Good ROM, no contractures. Normal muscle tone.  Skin: no rashes, lesions, ulcers. No induration Neurologic: CN 2-12 grossly intact. Sensation intact, DTR normal. Strength 5/5 in all 4.  Psychiatric: Normal judgment and insight. Alert and oriented x 3. Normal mood.   Labs on Admission: I have personally reviewed following labs and imaging studies  CBC: Recent Labs  Lab 12/12/24 0545  WBC 32.1*  NEUTROABS 26.3*  HGB 15.5  HCT 49.7  MCV 94.5  PLT 270   Basic Metabolic Panel: Recent Labs  Lab 12/12/24 0545  NA 137  K 4.4  CL 100  CO2 25  GLUCOSE 153*  BUN 18  CREATININE 1.20  CALCIUM  8.8*   GFR: Estimated Creatinine Clearance: 50.3 mL/min (by C-G formula based on SCr of 1.2 mg/dL). Liver Function Tests: Recent Labs  Lab 12/12/24 0545  AST 21  ALT 14  ALKPHOS 117  BILITOT 0.9  PROT 6.9  ALBUMIN 4.3   Recent Labs  Lab 12/12/24 0545  LIPASE 26   No results for input(s): AMMONIA in the last 168 hours. Coagulation Profile: No results for input(s): INR,  PROTIME in the last 168 hours. Cardiac Enzymes: No results for input(s): CKTOTAL, CKMB, CKMBINDEX, TROPONINI in the last 168 hours. BNP (last 3 results) Recent Labs    12/12/24 0545  PROBNP 1,055.0*  HbA1C: No results for input(s): HGBA1C in the last 72 hours. CBG: No results for input(s): GLUCAP in the last 168 hours. Lipid Profile: No results for input(s): CHOL, HDL, LDLCALC, TRIG, CHOLHDL, LDLDIRECT in the last 72 hours. Thyroid  Function Tests: No results for input(s): TSH, T4TOTAL, FREET4, T3FREE, THYROIDAB in the last 72 hours. Anemia Panel: No results for input(s): VITAMINB12, FOLATE, FERRITIN, TIBC, IRON, RETICCTPCT in the last 72 hours. Urine analysis:    Component Value Date/Time   COLORURINE YELLOW (A) 08/17/2022 0254   APPEARANCEUR CLEAR (A) 08/17/2022 0254   APPEARANCEUR Clear 02/04/2013 1430   LABSPEC 1.011 08/17/2022 0254   LABSPEC 1.004 02/04/2013 1430   PHURINE 5.0 08/17/2022 0254   GLUCOSEU NEGATIVE 08/17/2022 0254   GLUCOSEU Negative 02/04/2013 1430   HGBUR NEGATIVE 08/17/2022 0254   BILIRUBINUR NEGATIVE 08/17/2022 0254   BILIRUBINUR Negative 02/04/2013 1430   KETONESUR NEGATIVE 08/17/2022 0254   PROTEINUR NEGATIVE 08/17/2022 0254   NITRITE NEGATIVE 08/17/2022 0254   LEUKOCYTESUR NEGATIVE 08/17/2022 0254   LEUKOCYTESUR Negative 02/04/2013 1430    Radiological Exams on Admission: CT CHEST WO CONTRAST Result Date: 12/12/2024 EXAM: CT CHEST WITHOUT CONTRAST 12/12/2024 09:06:11 AM TECHNIQUE: CT of the chest was performed without the administration of intravenous contrast. Multiplanar reformatted images are provided for review. Automated exposure control, iterative reconstruction, and/or weight based adjustment of the mA/kV was utilized to reduce the radiation dose to as low as reasonably achievable. COMPARISON: Chest CT 03/13/last year. CLINICAL HISTORY: 73 year old male with pneumonia, suspected complication,  and new lung base opacity on radiographs today. FINDINGS: MEDIASTINUM: Heart size is normal. No pericardial effusion. Advanced calcified aortic and left coronary artery atherosclerosis. New retained secretions at the carina and in the mainstem bronchi. LYMPH NODES: No mediastinal, hilar or axillary lymphadenopathy. LUNGS AND PLEURA: [place]ath trachea contour redemonstrated (series 4 image 34). New retained secretions at the carina and in the mainstem bronchi. Emphysema redemonstrated. Consolidation throughout the right middle lobe, specifically the lateral segment. Patchy adjacent right upper and lower lobe opacity. Small or trace layering right pleural effusion. No acute left lung base opacity. New lung herniation now in the left posterolateral intercostal space of ribs 9 and 10 (series 4 image 133). No pneumothorax. BONES: No acute osseous abnormality. UPPER ABDOMEN: Limited images of the upper abdomen demonstrate negative visible non-contrast upper abdominal viscera. IMPRESSION: 1. Right middle lobe consolidated Pneumonia, with early infection in the adjacent lobes. Small volume retained secretions in the central airways. Small or trace layering right pleural effusion. 2. Underlying Emphysema and COPD. New since last year left lower lung herniation in the left posterolateral intercostal space of ribs 9 and 10. Electronically signed by: Helayne Hurst MD 12/12/2024 09:16 AM EST RP Workstation: [id number]   DG Chest Port 1 View Result Date: 12/12/2024 CLINICAL DATA:  Questionable sepsis. EXAM: PORTABLE CHEST 1 VIEW COMPARISON:  01/12/2024 FINDINGS: Interval development of patchy and confluent airspace disease in the right lower lung consistent with pneumonia. Similar patchy opacity at the left base is likely chronic. Bullous changes with underlying emphysema noted. Cardiopericardial silhouette is at upper limits of normal for size. No acute bony abnormality. Telemetry leads overlie the chest. IMPRESSION:  Interval development of patchy and confluent airspace disease in the right lower lung consistent with pneumonia. Electronically Signed   By: Camellia Candle M.D.   On: 12/12/2024 06:31    EKG: Independently reviewed.  Sinus tachycardia, no acute ST changes.  Assessment/Plan Principal Problem:   Pneumonia Active Problems:   CAP (community acquired  pneumonia)  (please populate well all problems here in Problem List. (For example, if patient is on BP meds at home and you resume or decide to hold them, it is a problem that needs to be her. Same for CAD, COPD, HLD and so on)  Sepsis - Sepsis as evidenced by tachycardia, leukocytosis increased lactic acid level, source infection is RML CAP, bacterial. - Received 1 L IV bolus, given history of chronic HFpEF, hold off further IV bolus and maintenance IV fluid. - Continue CAP coverage with ceftriaxone  azithromycin  - Culture sputum - Breathing treatment and incentive spirometry.  RML CAP bacterial Acute hypoxic respiratory failure -Antibiotics regimen as above. -Was able to wean off BiPAP during ED stay, now on 5 L nasal cannula - I reviewed the CT chest, cannot rule out any nodule/mass of right side of main bronchus, recommend repeat CT chest in 6 to 8 weeks to document resolution of the pneumonia. -Other Ddx, patient seem to have low risk for aspiration.  He does have a newly found hiatal hernia being followed by GI.  CT chest showed no signs of empyema or lung abscess formation.  Acute COPD exacerbation -VBG showed no CO2 retention - Continue IV Solu-Medrol  - Continue ICS and LABA - Change breathing treatment to Atrovent  plus as needed Xopenex  to avoid worsening of tachycardia.  PAF - In sinus rhythm Continue metoprolol - - Continue Pradaxa    DVT prophylaxis: Pradaxa  Code Status: Full code Family Communication: Wife at bedside Disposition Plan: Patient is sick with acute hypoxic respiratory distress started pneumonia requiring IV  antibiotics and COPD exacerbation requiring steroid, expect more than 2 midnight hospital stay. Consults called: None Admission status: PCU admission   Cort ONEIDA Mana MD [facility] Pager [phone]  12/12/2024, 11:14 AM       [1]  Allergies Allergen Reactions   Zantac [Ranitidine Hcl] Shortness Of Breath    Eruption, HIVES, SHORTNESS OF BREATH, Eruption, HIVES, SHORTNESS OF BREATH    Molnupiravir Swelling   Codeine Itching    Other reaction(s): PRURITIS   Erythromycin Hives    Other reaction(s): SWELLING (NON-SPECIFIC), HIVES, SWELLING (NON-SPECIFIC), HIVES   Morphine  Itching   Tiotropium Bromide    Hydrocodone Rash    Other reaction(s): PRURITIS   "

## 2024-12-13 DIAGNOSIS — J189 Pneumonia, unspecified organism: Secondary | ICD-10-CM | POA: Diagnosis not present

## 2024-12-13 LAB — CBC
HCT: 43.7 % (ref 39.0–52.0)
Hemoglobin: 13.5 g/dL (ref 13.0–17.0)
MCH: 29.5 pg (ref 26.0–34.0)
MCHC: 30.9 g/dL (ref 30.0–36.0)
MCV: 95.6 fL (ref 80.0–100.0)
Platelets: 195 K/uL (ref 150–400)
RBC: 4.57 MIL/uL (ref 4.22–5.81)
RDW: 16.5 % — ABNORMAL HIGH (ref 11.5–15.5)
WBC: 34.2 K/uL — ABNORMAL HIGH (ref 4.0–10.5)
nRBC: 0 % (ref 0.0–0.2)

## 2024-12-13 LAB — BASIC METABOLIC PANEL WITH GFR
Anion gap: 13 (ref 5–15)
BUN: 25 mg/dL — ABNORMAL HIGH (ref 8–23)
CO2: 26 mmol/L (ref 22–32)
Calcium: 8.7 mg/dL — ABNORMAL LOW (ref 8.9–10.3)
Chloride: 95 mmol/L — ABNORMAL LOW (ref 98–111)
Creatinine, Ser: 1.33 mg/dL — ABNORMAL HIGH (ref 0.61–1.24)
GFR, Estimated: 56 mL/min — ABNORMAL LOW
Glucose, Bld: 112 mg/dL — ABNORMAL HIGH (ref 70–99)
Potassium: 4.2 mmol/L (ref 3.5–5.1)
Sodium: 135 mmol/L (ref 135–145)

## 2024-12-13 LAB — EXPECTORATED SPUTUM ASSESSMENT W GRAM STAIN, RFLX TO RESP C

## 2024-12-13 LAB — LACTIC ACID, PLASMA: Lactic Acid, Venous: 2.9 mmol/L (ref 0.5–1.9)

## 2024-12-13 MED ORDER — GABAPENTIN 300 MG PO CAPS
300.0000 mg | ORAL_CAPSULE | Freq: Three times a day (TID) | ORAL | Status: DC | PRN
  Administered 2024-12-13 – 2024-12-14 (×3): 600 mg via ORAL
  Filled 2024-12-13 (×3): qty 2

## 2024-12-13 MED ORDER — GUAIFENESIN-DM 100-10 MG/5ML PO SYRP
5.0000 mL | ORAL_SOLUTION | ORAL | Status: DC | PRN
  Administered 2024-12-13 (×2): 5 mL via ORAL
  Filled 2024-12-13 (×2): qty 10

## 2024-12-13 MED ORDER — ALBUTEROL SULFATE (2.5 MG/3ML) 0.083% IN NEBU
2.5000 mg | INHALATION_SOLUTION | Freq: Once | RESPIRATORY_TRACT | Status: AC
  Administered 2024-12-13: 2.5 mg via RESPIRATORY_TRACT
  Filled 2024-12-13: qty 3

## 2024-12-13 MED ORDER — OXYCODONE HCL 5 MG PO TABS
5.0000 mg | ORAL_TABLET | ORAL | Status: DC | PRN
  Administered 2024-12-13 – 2024-12-17 (×16): 5 mg via ORAL
  Filled 2024-12-13 (×5): qty 1

## 2024-12-13 MED ORDER — TRAZODONE HCL 100 MG PO TABS
100.0000 mg | ORAL_TABLET | Freq: Every evening | ORAL | Status: DC | PRN
  Administered 2024-12-13 – 2024-12-16 (×4): 100 mg via ORAL
  Filled 2024-12-13 (×2): qty 1

## 2024-12-13 NOTE — Progress Notes (Addendum)
 "       Overnight   NAME: Darius Norris MRN: [id number] DOB : [birth year]    Date of Service   12/13/2024   HPI/Events of Note   HPI:  73 y.o. 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   Symptoms started 4 weeks ago when patient started to develop wheezing productive cough with occasional coughing up yellowish phlegm, last few days patient also started develop right-sided chest pain, worsening with cough and deep breath, he has no fever or chills.  Denies any cough or choking after eating or drinking no swallowing difficulties.  No recent weight loss.  Last night, patient became more shortness of breath and could not sleep, and family called EMS.  EMS arrived and found patient more hypoxic and gave patient Solu-Medrol  and albuterol  treatment.   ED Course: Afebrile, tachycardia heart rate in the 110s, tachypneic blood pressure 124/81 O2 saturation 99% on BiPAP 15/840%.  Chest x-ray showed right-sided lower field infiltrates.  CT chest without contrast showed right middle lobe infiltrates and consolidation.  Blood work showed WBC 32 with left shift, WBC morphology unremarkable,, lactic acid 2.5, proBNP 1000  Patient has been started on ceftriaxone  and azithromycin .  For pneumonia.  In the emergency room he received 1 L IV bolus, vancomycin .  Sputum culture pending.  IV Solu-Medrol , ICS, LABA, Atrovent  and Xopenex .    Overnight: RN notified me that patient was having shortness of breath, desaturations into the 70s.  Patient required short burst of 15 L high flow oxygen.  RN was able to wean patient back to 6 L high flow.  Albuterol  once ordered.  On arrival to room patient was in tripod position reporting that his shortness of breath is about the same as it was when he came in, and that the albuterol  did improve his symptoms a little.   Patient did endorse mild chest pain, hemoptysis, inability to rest.  Discussed appearance of hemoptysis patient reports small amount of blood in sputum, endorsed bloody nose and sore throat.  Informed patient most recent CT was negative for pulmonary embolism, and that the blood in his sputum most likely is related to either trauma of the esophagus, or bloody postnasal drip.  Patient reported he did not want to wear the BiPAP if he did not have to.   Review of Systems  Respiratory:  Positive for cough, hemoptysis, sputum production and shortness of breath.   Cardiovascular:  Positive for chest pain. Negative for palpitations and leg swelling.    Physical Exam Vitals and nursing note reviewed.  Constitutional:      General: He is in acute distress.     Appearance: He is overweight.  Eyes:     Pupils: Pupils are equal, round, and reactive to light.  Cardiovascular:     Rate and Rhythm: Normal rate and regular rhythm.     Pulses: Normal pulses.          Radial pulses are 2+ on the right side and 2+ on the left side.     Heart sounds: S1 normal and S2 normal.  Pulmonary:     Effort: Tachypnea and respiratory distress present.     Breath sounds: Decreased air movement present. Examination of the right-upper field reveals decreased breath sounds. Examination of the left-upper field reveals decreased breath sounds. Examination of the right-middle field  reveals decreased breath sounds. Examination of the left-middle field reveals decreased breath sounds. Examination of the right-lower field reveals decreased breath sounds. Examination of the left-lower field reveals decreased breath sounds. Decreased breath sounds present.  Abdominal:     General: Bowel sounds are normal.     Palpations: Abdomen is soft.  Musculoskeletal:        General: Normal range of motion.     Right lower leg: No edema.     Left lower leg: No edema.  Skin:    General: Skin is warm and dry.     Capillary Refill: Capillary  refill takes 2 to 3 seconds.  Neurological:     General: No focal deficit present.     Mental Status: He is alert and oriented to person, place, and time.     GCS: GCS eye subscore is 4. GCS verbal subscore is 5. GCS motor subscore is 6.  Psychiatric:        Attention and Perception: Attention normal.        Mood and Affect: Mood normal.       Interventions/ Plan   Albuterol  once  Encourage IS use  Continuous SPO2 monitoring   Update:  Lactic acidosis with lactic 2.9 up from 2.1, pt already received IV fluids and is on antibiotics. Holding IV fluids for now due to HFpEF    Laneta Gardener- Garmon BSN RN CCRN AGACNP-BC Acute Care Nurse Practitioner [facility] [place]  "

## 2024-12-13 NOTE — Evaluation (Signed)
 Physical Therapy Evaluation Patient Details Name: Darius Norris MRN: [id number] DOB: 11/24/1951 Today's Date: 12/13/2024  History of Present Illness  Pt is a 73 y.o. 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  MD assessment includes: CAP, sepsis, acute hypoxic respiratory failure, and acute COPD exacerbation.   Clinical Impression  Pt was pleasant and motivated to participate during the session and put forth good effort throughout. Pt found sitting EOB leaning on bedside table in tripod position that pt reported is his standard position for comfort.  Pt found on 6L with SpO2 in the low to mid 90s, HR WNL.  Pt required cuing, multiple rocking attempts, and min A to come to standing at the EOB and fatigued quickly/became moderately SOB with limited amb at the EOB per below and requested to return to sitting.  Pt's SpO2 became difficult to read after exertion with poor pleth but when signal was achieved was generally in the low to mid 90s.  Pt's SOB resolved with PLB after sitting for around 1-2 min.  Pt will benefit from continued PT services upon discharge to safely address deficits listed in patient problem list for decreased caregiver assistance and eventual return to PLOF.            If plan is discharge home, recommend the following: A lot of help with walking and/or transfers;A little help with bathing/dressing/bathroom;Assistance with cooking/housework;Assist for transportation;Help with stairs or ramp for entrance   Can travel by private vehicle   No    Equipment Recommendations Other (comment) (TBD at next venue of care (may benefit from a [facility] if discharges home))  Recommendations for Other Services       Functional Status Assessment Patient has had a recent decline in their functional status and demonstrates the  ability to make significant improvements in function in a reasonable and predictable amount of time.     Precautions / Restrictions Precautions Precautions: Fall Restrictions Weight Bearing Restrictions Per Provider Order: No      Mobility  Bed Mobility               General bed mobility comments: NT, pt found sitting at EOB pre-post session    Transfers Overall transfer level: Needs assistance Equipment used: Rolling walker (2 wheels) Transfers: Sit to/from Stand Sit to Stand: Min assist           General transfer comment: Significant effort including multiple rocking attempts along with min A to come to standing, min verbal and tactile cues for hand placement and general sequencing    Ambulation/Gait Ambulation/Gait assistance: Min assist Gait Distance (Feet): 6 Feet Assistive device: Rolling walker (2 wheels) Gait Pattern/deviations: Step-to pattern, Trunk flexed, Decreased step length - right, Decreased step length - left Gait velocity: decreased     General Gait Details: Pt able to take several small steps at the EOB with min to mod lean on the RW for support and min A for stability before fatiguing and needing to return to sitting  Stairs            Wheelchair Mobility     Tilt Bed    Modified Rankin (Stroke Patients Only)       Balance Overall balance assessment: Needs assistance   Sitting balance-Leahy Scale: Normal     Standing balance support: Bilateral upper extremity supported, During functional activity,  Reliant on assistive device for balance Standing balance-Leahy Scale: Poor                               Pertinent Vitals/Pain Pain Assessment Pain Assessment: 0-10 Pain Score: 6  Pain Location: back Pain Descriptors / Indicators: Sore Pain Intervention(s): Premedicated before session, Monitored during session    Home Living Family/patient expects to be discharged to:: Private residence Living Arrangements:  Spouse/significant other Available Help at Discharge: Family;Available PRN/intermittently Type of Home: House Home Access: Ramped entrance       Home Layout: One level Home Equipment: [employer] (2 wheels);Rollator (4 wheels);Electric scooter;Other (comment) Additional Comments: tub transfer bench, HH shower    Prior Function Prior Level of Function : Needs assist       Physical Assist : ADLs (physical)     Mobility Comments: Mod Ind amb home distances with a rollator, power scooter in the community, no falls in the last 6 months ADLs Comments: Spouse assists with ADLs     Extremity/Trunk Assessment   Upper Extremity Assessment Upper Extremity Assessment: Generalized weakness    Lower Extremity Assessment Lower Extremity Assessment: Generalized weakness       Communication   Communication Communication: Impaired Factors Affecting Communication: Difficulty expressing self    Cognition Arousal: Alert Behavior During Therapy: WFL for tasks assessed/performed   PT - Cognitive impairments: No apparent impairments                         Following commands: Intact       Cueing Cueing Techniques: Verbal cues, Tactile cues     General Comments      Exercises     Assessment/Plan    PT Assessment Patient needs continued PT services  PT Problem List Decreased strength;Decreased activity tolerance;Decreased balance;Decreased mobility;Decreased knowledge of use of DME;Pain       PT Treatment Interventions DME instruction;Functional mobility training;Therapeutic activities;Therapeutic exercise;Balance training;Gait training;Patient/family education    PT Goals (Current goals can be found in the Care Plan section)  Acute Rehab PT Goals Patient Stated Goal: To get stronger PT Goal Formulation: With patient Time For Goal Achievement: 12/26/24 Potential to Achieve Goals: Good    Frequency Min 3X/week     Co-evaluation                AM-PAC PT 6 Clicks Mobility  Outcome Measure Help needed turning from your back to your side while in a flat bed without using bedrails?: A Little Help needed moving from lying on your back to sitting on the side of a flat bed without using bedrails?: A Little   Help needed standing up from a chair using your arms (e.g., wheelchair or bedside chair)?: A Little Help needed to walk in hospital room?: A Lot Help needed climbing 3-5 steps with a railing? : Total 6 Click Score: 12    End of Session Equipment Utilized During Treatment: Gait belt;Oxygen Activity Tolerance: Other (comment) (limited by fatigue/SOB with exertion) Patient left: in bed;with family/visitor present;with call bell/phone within reach;with bed alarm set;Other (comment) (Pt left sitting at EOB as found per pt request) Nurse Communication: Mobility status PT Visit Diagnosis: Unsteadiness on feet (R26.81);Difficulty in walking, not elsewhere classified (R26.2);Muscle weakness (generalized) (M62.81);Pain Pain - part of body:  (back)    Time: 9090-9064 PT Time Calculation (min) (ACUTE ONLY): 26 min   Charges:   PT Evaluation $PT Eval  Moderate Complexity: 1 Mod   PT General Charges $$ ACUTE PT VISIT: 1 Visit    D. Scott Leaman Abe PT, DPT 12/13/2024, 11:05 AM

## 2024-12-13 NOTE — Hospital Course (Addendum)
 Hospital course / significant events:   HPI: Darius Norris is a 73 y.o. 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 Symptoms started 4 weeks ago when patient started to develop wheezing productive cough with occasional coughing up yellowish phlegm, last few days patient also started develop right-sided chest pain, worsening with cough and deep breath, he has no fever or chills.  Last night, more shortness of breath and could not sleep, and family called EMS.  EMS arrived and found patient more hypoxic and gave patient Solu-Medrol  and albuterol  treatment.  12/26: to ED. Afebrile, tachycardia heart rate in the 110s, tachypneic blood pressure 124/81 O2 saturation 99% on BiPAP 15/840%.  Chest x-ray showed right-sided lower field infiltrates.  CT chest without contrast showed right middle lobe infiltrates and consolidation.  Blood work showed WBC 32 with left shift, WBC morphology unremarkable,, lactic acid 2.5, proBNP 1000. Patient was given IV fentanyl , cefepime  and LR 1000 mL x 1.Admitted to hospitalist for sepsis d/t RML lobar CAP and COPD exacerbation.  12/27: NRB --> BiPAP --> now on HFNC 6L/min,  12/28: down to 5L, lung sounds are improved.  12/29: down to 4L.     Consultants:  none  Procedures/Surgeries: none      ASSESSMENT & PLAN:   Sepsis d/t RML CAP - severe sepsis w/ hypoxia, elevation lactic acid  COPD exacerbation  Acute on chornic hypoxic respiratory failure  Other Ddx, patient seem to have low risk for aspiration.  He does have a newly found hiatal hernia being followed by GI.  CT chest showed no signs of empyema or lung abscess formation. He was recently dx w/ lung hernia on L and has hurt to breathe for some time, probably reduced inspiration has contributed to PNA risk  Caution w/ IV fluids given hx CHF, will  repeat fluids only if low BP  CAP coverage with ceftriaxone  doxycycline   Aformoterol + umeclidinium scheduled  as needed levalbuterol  to avoid worsening tachycardia. IV solumedrol  --> prednisone   No ICS w/ pneumonia incentive spirometry. O2 supplementation as needed, wean as able  repeat CT chest in 6 to 8 weeks to document resolution of the pneumonia. Hold methotrexate  for now    PAF Rate controlled afib today metoprolol  Pradaxa   CAD Pradaxa  (no ASA), statin, metoprolol  Low BP limits other stnadard meds       Class 1 obesity based on BMI: Body mass index is 32.34 kg/m.SABRA Significantly low or high BMI is associated with higher medical risk.  Underweight - under 18  overweight - 25 to 29 obese - 30 or more Class 1 obesity: BMI of 30.0 to 34 Class 2 obesity: BMI of 35.0 to 39 Class 3 obesity: BMI of 40.0 to 49 Super Morbid Obesity: BMI 50-59 Super-super Morbid Obesity: BMI 60+ Healthy nutrition and physical activity advised as adjunct to other disease management and risk reduction treatments    DVT prophylaxis: pradaxa  IV fluids: no continuous IV fluids  Nutrition: cardiac Central lines / other devices: none  Code Status: FULL CODE ACP documentation reviewed: none on file in [facility]  [facility] needs: Home health, patient has declined skilled nursing facility for rehab Medical barriers to dispo: hypoxic respiraotry failure. Expected medical readiness for discharge tomorrow

## 2024-12-13 NOTE — Progress Notes (Signed)
 " PROGRESS NOTE    Darius Norris   FMW:[id number] DOB: May 20, 1951  DOA: 12/12/2024 Date of Service: 12/13/2024 which is hospital day 1  PCP: [facility] course / significant events:   HPI: Darius Norris is a 73 y.o. 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 Symptoms started 4 weeks ago when patient started to develop wheezing productive cough with occasional coughing up yellowish phlegm, last few days patient also started develop right-sided chest pain, worsening with cough and deep breath, he has no fever or chills.  Last night, more shortness of breath and could not sleep, and family called EMS.  EMS arrived and found patient more hypoxic and gave patient Solu-Medrol  and albuterol  treatment.  12/26: to ED. Afebrile, tachycardia heart rate in the 110s, tachypneic blood pressure 124/81 O2 saturation 99% on BiPAP 15/840%.  Chest x-ray showed right-sided lower field infiltrates.  CT chest without contrast showed right middle lobe infiltrates and consolidation.  Blood work showed WBC 32 with left shift, WBC morphology unremarkable,, lactic acid 2.5, proBNP 1000. Patient was given IV fentanyl , cefepime  and LR 1000 mL x 1.Admitted to hospitalist for sepsis d/t RML lobar CAP and COPD exacerbation.  12/26: NRB --> BiPAP --> now on HFNC 6L/min,      Consultants:  none  Procedures/Surgeries: none      ASSESSMENT & PLAN:   Sepsis d/t RML CAP - severe sepsis w/ hypoxia, elevation lactic acid  COPD exacerbation  Acute on chornic hypoxic respiratory failure  Other Ddx, patient seem to have low risk for aspiration.  He does have a newly found hiatal hernia being followed by GI.  CT chest showed no signs of empyema or lung abscess formation. He was recently dx w/ lung hernia on L and has hurt to breathe  for some time, probably reduced inspiration has contributed to PNA risk  Caution w/ IV fluids given hx CHF, will repeat fluids only if low BP  CAP coverage with ceftriaxone  doxycycline   Culture sputum Aformoterol + umeclidinium scheduled  as needed levalbuterol  to avoid worsening of tachycardia. IV solumedrol   No ICS w/ pneumonia incentive spirometry. O2 supplementation as needed, wean as able  repeat CT chest in 6 to 8 weeks to document resolution of the pneumonia.   PAF In sinus rhythm metoprolol  Pradaxa   CAD Pradaxa  (no ASA), statin, metoprolol  Low BP limits other stnadard meds       Class 1 obesity based on BMI: Body mass index is 32.34 kg/m.SABRA Significantly low or high BMI is associated with higher medical risk.  Underweight - under 18  overweight - 25 to 29 obese - 30 or more Class 1 obesity: BMI of 30.0 to 34 Class 2 obesity: BMI of 35.0 to 39 Class 3 obesity: BMI of 40.0 to 49 Super Morbid Obesity: BMI 50-59 Super-super Morbid Obesity: BMI 60+ Healthy nutrition and physical activity advised as adjunct to other disease management and risk reduction treatments    DVT prophylaxis: pradaxa  IV fluids: no continuous IV fluids  Nutrition: cardiac Central lines / other devices: none  Code Status: FULL CODE ACP documentation reviewed: none on file in [facility]  TOC needs: TBD Medical barriers to dispo: hypoxic respiraotry failure. Expected medical readiness for discharge few days.  Subjective / Brief ROS:  Patient reports breathing today is about the same Denies CP/SOB.  Pain controlled.  Denies new weakness.  Tolerating diet.  Reports no concerns w/ urination/defecation.   Family Communication: support person at bedside on rounds     Objective Findings:  Vitals:   12/13/24 0418 12/13/24 0447 12/13/24 0725 12/13/24 1130  BP: (!) 148/89  110/81 121/60  Pulse: 82  79 76  Resp:   16 (!) 27  Temp: 98 F (36.7 C)  (!) 97.3 F (36.3  C) (!) 97.5 F (36.4 C)  TempSrc: Oral  Oral Oral  SpO2: 97% 97% 97%   Weight:      Height:        Intake/Output Summary (Last 24 hours) at 12/13/2024 1539 Last data filed at 12/13/2024 1027 Gross per 24 hour  Intake 480 ml  Output 1150 ml  Net -670 ml   Filed Weights   12/12/24 0545  Weight: 80.2 kg    Examination:  Physical Exam Constitutional:      General: He is not in acute distress. Cardiovascular:     Rate and Rhythm: Normal rate.     Heart sounds: Murmur heard.  Pulmonary:     Breath sounds: Examination of the right-lower field reveals decreased breath sounds. Examination of the left-lower field reveals decreased breath sounds. Decreased breath sounds, wheezing and rhonchi present.  Musculoskeletal:     Right lower leg: No edema.     Left lower leg: No edema.  Skin:    General: Skin is warm and dry.  Neurological:     Mental Status: He is alert and oriented to person, place, and time.  Psychiatric:        Mood and Affect: Mood normal.        Behavior: Behavior normal.          Scheduled Medications:   arformoterol   15 mcg Nebulization BID   And   umeclidinium bromide   1 puff Inhalation Daily   atorvastatin   40 mg Oral QHS   dabigatran   150 mg Oral BID   doxycycline   100 mg Oral Q12H   levalbuterol   0.63 mg Nebulization Q6H   melatonin  5 mg Oral QHS   methylPREDNISolone  (SOLU-MEDROL ) injection  40 mg Intravenous Q12H   metoprolol   100 mg Oral Daily   pantoprazole   40 mg Oral Daily    Continuous Infusions:  cefTRIAXone  (ROCEPHIN )  IV 2 g (12/13/24 0902)    PRN Medications:  fluticasone , gabapentin , guaiFENesin -dextromethorphan , labetalol , oxyCODONE , polyethylene glycol, simethicone   Antimicrobials from admission:  Anti-infectives (From admission, onward)    Start     Dose/Rate Route Frequency Ordered Stop   12/13/24 1000  cefTRIAXone  (ROCEPHIN ) 2 g in sodium chloride  0.9 % 100 mL IVPB        2 g 200 mL/hr over 30 Minutes Intravenous  Every 24 hours 12/12/24 0841 12/18/24 0959   12/12/24 1000  doxycycline  (VIBRA -TABS) tablet 100 mg        100 mg Oral Every 12 hours 12/12/24 0841     12/12/24 0700  vancomycin  (VANCOCIN ) IVPB 1000 mg/200 mL premix  Status:  Discontinued        1,000 mg 200 mL/hr over 60 Minutes Intravenous  Once 12/12/24 0652 12/12/24 0654   12/12/24 0700  ceFEPIme  (MAXIPIME ) 2 g in sodium chloride  0.9 % 100 mL IVPB        2 g 200 mL/hr over 30 Minutes Intravenous  Once 12/12/24 9347 12/12/24 0745  12/12/24 0700  vancomycin  (VANCOREADY) IVPB 2000 mg/400 mL        2,000 mg 200 mL/hr over 120 Minutes Intravenous  Once 12/12/24 0654 12/12/24 0944           Data Reviewed:  I have personally reviewed the following...  CBC: Recent Labs  Lab 12/12/24 0545 12/13/24 0405  WBC 32.1* 34.2*  NEUTROABS 26.3*  --   HGB 15.5 13.5  HCT 49.7 43.7  MCV 94.5 95.6  PLT 270 195   Basic Metabolic Panel: Recent Labs  Lab 12/12/24 0545 12/13/24 0405  NA 137 135  K 4.4 4.2  CL 100 95*  CO2 25 26  GLUCOSE 153* 112*  BUN 18 25*  CREATININE 1.20 1.33*  CALCIUM  8.8* 8.7*   GFR: Estimated Creatinine Clearance: 45.3 mL/min (A) (by C-G formula based on SCr of 1.33 mg/dL (H)). Liver Function Tests: Recent Labs  Lab 12/12/24 0545  AST 21  ALT 14  ALKPHOS 117  BILITOT 0.9  PROT 6.9  ALBUMIN 4.3   Recent Labs  Lab 12/12/24 0545  LIPASE 26   No results for input(s): AMMONIA in the last 168 hours. Coagulation Profile: No results for input(s): INR, PROTIME in the last 168 hours. Cardiac Enzymes: No results for input(s): CKTOTAL, CKMB, CKMBINDEX, TROPONINI in the last 168 hours. BNP (last 3 results) Recent Labs    12/12/24 0545  PROBNP 1,055.0*   HbA1C: No results for input(s): HGBA1C in the last 72 hours. CBG: No results for input(s): GLUCAP in the last 168 hours. Lipid Profile: No results for input(s): CHOL, HDL, LDLCALC, TRIG, CHOLHDL, LDLDIRECT in the  last 72 hours. Thyroid  Function Tests: No results for input(s): TSH, T4TOTAL, FREET4, T3FREE, THYROIDAB in the last 72 hours. Anemia Panel: No results for input(s): VITAMINB12, FOLATE, FERRITIN, TIBC, IRON, RETICCTPCT in the last 72 hours. Most Recent Urinalysis On File:     Component Value Date/Time   COLORURINE YELLOW (A) 08/17/2022 0254   APPEARANCEUR CLEAR (A) 08/17/2022 0254   APPEARANCEUR Clear 02/04/2013 1430   LABSPEC 1.011 08/17/2022 0254   LABSPEC 1.004 02/04/2013 1430   PHURINE 5.0 08/17/2022 0254   GLUCOSEU NEGATIVE 08/17/2022 0254   GLUCOSEU Negative 02/04/2013 1430   HGBUR NEGATIVE 08/17/2022 0254   BILIRUBINUR NEGATIVE 08/17/2022 0254   BILIRUBINUR Negative 02/04/2013 1430   KETONESUR NEGATIVE 08/17/2022 0254   PROTEINUR NEGATIVE 08/17/2022 0254   NITRITE NEGATIVE 08/17/2022 0254   LEUKOCYTESUR NEGATIVE 08/17/2022 0254   LEUKOCYTESUR Negative 02/04/2013 1430   Sepsis Labs: @LABRCNTIP (procalcitonin:4,lacticidven:4) Microbiology: Recent Results (from the past 240 hours)  Resp panel by RT-PCR (RSV, Flu A&B, Covid) Anterior Nasal Swab     Status: None   Collection Time: 12/12/24  5:40 AM   Specimen: Anterior Nasal Swab  Result Value Ref Range Status   SARS Coronavirus 2 by RT PCR NEGATIVE NEGATIVE Final    Comment: (NOTE) SARS-CoV-2 target nucleic acids are NOT DETECTED.  The SARS-CoV-2 RNA is generally detectable in upper respiratory specimens during the acute phase of infection. The lowest concentration of SARS-CoV-2 viral copies this assay can detect is 138 copies/mL. A negative result does not preclude SARS-Cov-2 infection and should not be used as the sole basis for treatment or other patient management decisions. A negative result may occur with  improper specimen collection/handling, submission of specimen other than nasopharyngeal swab, presence of viral mutation(s) within the areas targeted by this assay, and inadequate number of  viral copies(<138 copies/mL). A negative result must be combined with clinical  observations, patient history, and epidemiological information. The expected result is Negative.  Fact Sheet for Patients:  bloggercourse.com  Fact Sheet for Healthcare Providers:  seriousbroker.it  This test is no t yet approved or cleared by the [place]  FDA and  has been authorized for detection and/or diagnosis of SARS-CoV-2 by FDA under an Emergency Use Authorization (EUA). This EUA will remain  in effect (meaning this test can be used) for the duration of the COVID-19 declaration under Section 564(b)(1) of the Act, 21 U.S.C.section 360bbb-3(b)(1), unless the authorization is terminated  or revoked sooner.       Influenza A by PCR NEGATIVE NEGATIVE Final   Influenza B by PCR NEGATIVE NEGATIVE Final    Comment: (NOTE) The Xpert Xpress SARS-CoV-2/FLU/RSV plus assay is intended as an aid in the diagnosis of influenza from Nasopharyngeal swab specimens and should not be used as a sole basis for treatment. Nasal washings and aspirates are unacceptable for Xpert Xpress SARS-CoV-2/FLU/RSV testing.  Fact Sheet for Patients: bloggercourse.com  Fact Sheet for Healthcare Providers: seriousbroker.it  This test is not yet approved or cleared by the [place]  FDA and has been authorized for detection and/or diagnosis of SARS-CoV-2 by FDA under an Emergency Use Authorization (EUA). This EUA will remain in effect (meaning this test can be used) for the duration of the COVID-19 declaration under Section 564(b)(1) of the Act, 21 U.S.C. section 360bbb-3(b)(1), unless the authorization is terminated or revoked.     Resp Syncytial Virus by PCR NEGATIVE NEGATIVE Final    Comment: (NOTE) Fact Sheet for Patients: bloggercourse.com  Fact Sheet for Healthcare  Providers: seriousbroker.it  This test is not yet approved or cleared by the [place]  FDA and has been authorized for detection and/or diagnosis of SARS-CoV-2 by FDA under an Emergency Use Authorization (EUA). This EUA will remain in effect (meaning this test can be used) for the duration of the COVID-19 declaration under Section 564(b)(1) of the Act, 21 U.S.C. section 360bbb-3(b)(1), unless the authorization is terminated or revoked.  Performed at [facility], [address] [place], [place] [address]   Blood culture (routine single)     Status: None (Preliminary result)   Collection Time: 12/12/24  5:56 AM   Specimen: BLOOD  Result Value Ref Range Status   Specimen Description BLOOD BLOOD LEFT ARM  Final   Special Requests   Final    BOTTLES DRAWN AEROBIC AND ANAEROBIC Blood Culture adequate volume   Culture   Final    NO GROWTH 1 DAY Performed at [facility], [address] [place], [place] [address]    Report Status PENDING  Incomplete  Culture, blood (single)     Status: None (Preliminary result)   Collection Time: 12/12/24  7:10 AM   Specimen: BLOOD  Result Value Ref Range Status   Specimen Description BLOOD BLOOD RIGHT ARM  Final   Special Requests   Final    BOTTLES DRAWN AEROBIC AND ANAEROBIC Blood Culture results may not be optimal due to an inadequate volume of blood received in culture bottles   Culture   Final    NO GROWTH < 24 HOURS Performed at [facility], [address], [place], [place] [address]    Report Status PENDING  Incomplete  Expectorated Sputum Assessment w Gram Stain, Rflx to Resp Cult     Status: None   Collection Time: 12/12/24  9:59 PM   Specimen: Expectorated Sputum  Result Value Ref Range Status   Specimen Description  EXPECTORATED SPUTUM  Final   Special Requests NONE  Final   Sputum evaluation   Final    Sputum specimen not acceptable for testing.  Please recollect.    CALLED TO ALEX ASIDO @ 0110 ON 12/13/2024 BY CAF Performed at [facility], [address] [place], [place] [address]    Report Status 12/13/2024 FINAL  Final      Radiology Studies last 3 days: CT CHEST WO CONTRAST Result Date: 12/12/2024 EXAM: CT CHEST WITHOUT CONTRAST 12/12/2024 09:06:11 AM TECHNIQUE: CT of the chest was performed without the administration of intravenous contrast. Multiplanar reformatted images are provided for review. Automated exposure control, iterative reconstruction, and/or weight based adjustment of the mA/kV was utilized to reduce the radiation dose to as low as reasonably achievable. COMPARISON: Chest CT 03/13/last year. CLINICAL HISTORY: 73 year old male with pneumonia, suspected complication, and new lung base opacity on radiographs today. FINDINGS: MEDIASTINUM: Heart size is normal. No pericardial effusion. Advanced calcified aortic and left coronary artery atherosclerosis. New retained secretions at the carina and in the mainstem bronchi. LYMPH NODES: No mediastinal, hilar or axillary lymphadenopathy. LUNGS AND PLEURA: [place] trachea contour redemonstrated (series 4 image 34). New retained secretions at the carina and in the mainstem bronchi. Emphysema redemonstrated. Consolidation throughout the right middle lobe, specifically the lateral segment. Patchy adjacent right upper and lower lobe opacity. Small or trace layering right pleural effusion. No acute left lung base opacity. New lung herniation now in the left posterolateral intercostal space of ribs 9 and 10 (series 4 image 133). No pneumothorax. BONES: No acute osseous abnormality. UPPER ABDOMEN: Limited images of the upper abdomen demonstrate negative visible non-contrast upper abdominal viscera. IMPRESSION: 1. Right middle lobe consolidated Pneumonia, with early infection in the adjacent lobes. Small volume retained secretions in the central airways. Small or trace layering right pleural effusion.  2. Underlying Emphysema and COPD. New since last year left lower lung herniation in the left posterolateral intercostal space of ribs 9 and 10. Electronically signed by: Helayne Hurst MD 12/12/2024 09:16 AM EST RP Workstation: [id number]   DG Chest Port 1 View Result Date: 12/12/2024 CLINICAL DATA:  Questionable sepsis. EXAM: PORTABLE CHEST 1 VIEW COMPARISON:  01/12/2024 FINDINGS: Interval development of patchy and confluent airspace disease in the right lower lung consistent with pneumonia. Similar patchy opacity at the left base is likely chronic. Bullous changes with underlying emphysema noted. Cardiopericardial silhouette is at upper limits of normal for size. No acute bony abnormality. Telemetry leads overlie the chest. IMPRESSION: Interval development of patchy and confluent airspace disease in the right lower lung consistent with pneumonia. Electronically Signed   By: Camellia Candle M.D.   On: 12/12/2024 06:31          Deaunna Olarte, DO [facility] 12/13/2024, 3:39 PM    Dictation software may have been used to generate the above note. Typos may occur and escape review in typed/dictated notes. Please contact Dr Marsa directly for clarity if needed.  Staff may message me via secure chat in Epic  but this may not receive an immediate response,  please page me for urgent matters!  If 7PM-7AM, please contact night coverage [email or web address]       "

## 2024-12-14 DIAGNOSIS — J189 Pneumonia, unspecified organism: Secondary | ICD-10-CM | POA: Diagnosis not present

## 2024-12-14 LAB — BASIC METABOLIC PANEL WITH GFR
Anion gap: 10 (ref 5–15)
BUN: 29 mg/dL — ABNORMAL HIGH (ref 8–23)
CO2: 27 mmol/L (ref 22–32)
Calcium: 8.8 mg/dL — ABNORMAL LOW (ref 8.9–10.3)
Chloride: 100 mmol/L (ref 98–111)
Creatinine, Ser: 1.17 mg/dL (ref 0.61–1.24)
GFR, Estimated: >60 mL/min
Glucose, Bld: 137 mg/dL — ABNORMAL HIGH (ref 70–99)
Potassium: 5.1 mmol/L (ref 3.5–5.1)
Sodium: 137 mmol/L (ref 135–145)

## 2024-12-14 LAB — CBC
HCT: 40.6 % (ref 39.0–52.0)
Hemoglobin: 12.9 g/dL — ABNORMAL LOW (ref 13.0–17.0)
MCH: 29.5 pg (ref 26.0–34.0)
MCHC: 31.8 g/dL (ref 30.0–36.0)
MCV: 92.7 fL (ref 80.0–100.0)
Platelets: 184 K/uL (ref 150–400)
RBC: 4.38 MIL/uL (ref 4.22–5.81)
RDW: 16 % — ABNORMAL HIGH (ref 11.5–15.5)
WBC: 15.3 K/uL — ABNORMAL HIGH (ref 4.0–10.5)
nRBC: 0 % (ref 0.0–0.2)

## 2024-12-14 MED ORDER — LEVALBUTEROL HCL 0.63 MG/3ML IN NEBU
0.6300 mg | INHALATION_SOLUTION | Freq: Two times a day (BID) | RESPIRATORY_TRACT | Status: DC
  Administered 2024-12-14 – 2024-12-18 (×8): 0.63 mg via RESPIRATORY_TRACT
  Filled 2024-12-14 (×2): qty 3

## 2024-12-14 NOTE — Plan of Care (Signed)
 VSS. 4L HFNC. Family at bedside this shift. PRN Oxy given. See MAR. Continuing to wean O2.  Problem: Education: Goal: Knowledge of General Education information will improve Description: Including pain rating scale, medication(s)/side effects and non-pharmacologic comfort measures Outcome: Progressing   Problem: Health Behavior/Discharge Planning: Goal: Ability to manage health-related needs will improve Outcome: Progressing   Problem: Clinical Measurements: Goal: Ability to maintain clinical measurements within normal limits will improve Outcome: Progressing Goal: Will remain free from infection Outcome: Progressing Goal: Diagnostic test results will improve Outcome: Progressing Goal: Respiratory complications will improve Outcome: Progressing Goal: Cardiovascular complication will be avoided Outcome: Progressing   Problem: Activity: Goal: Risk for activity intolerance will decrease Outcome: Progressing   Problem: Nutrition: Goal: Adequate nutrition will be maintained Outcome: Progressing   Problem: Coping: Goal: Level of anxiety will decrease Outcome: Progressing   Problem: Elimination: Goal: Will not experience complications related to bowel motility Outcome: Progressing Goal: Will not experience complications related to urinary retention Outcome: Progressing   Problem: Pain Managment: Goal: General experience of comfort will improve and/or be controlled Outcome: Progressing   Problem: Safety: Goal: Ability to remain free from injury will improve Outcome: Progressing   Problem: Skin Integrity: Goal: Risk for impaired skin integrity will decrease Outcome: Progressing   Problem: Activity: Goal: Ability to tolerate increased activity will improve Outcome: Progressing   Problem: Clinical Measurements: Goal: Ability to maintain a body temperature in the normal range will improve Outcome: Progressing   Problem: Respiratory: Goal: Ability to maintain adequate  ventilation will improve Outcome: Progressing Goal: Ability to maintain a clear airway will improve Outcome: Progressing

## 2024-12-14 NOTE — Progress Notes (Addendum)
 " PROGRESS NOTE    Darius Norris   FMW:[id number] DOB: 10-01-1951  DOA: 12/12/2024 Date of Service: 12/14/2024 which is hospital day 2  PCP: [facility] course / significant events:   HPI: Darius Norris is a 73 y.o. 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 Symptoms started 4 weeks ago when patient started to develop wheezing productive cough with occasional coughing up yellowish phlegm, last few days patient also started develop right-sided chest pain, worsening with cough and deep breath, he has no fever or chills.  Last night, more shortness of breath and could not sleep, and family called EMS.  EMS arrived and found patient more hypoxic and gave patient Solu-Medrol  and albuterol  treatment.  12/26: to ED. Afebrile, tachycardia heart rate in the 110s, tachypneic blood pressure 124/81 O2 saturation 99% on BiPAP 15/840%.  Chest x-ray showed right-sided lower field infiltrates.  CT chest without contrast showed right middle lobe infiltrates and consolidation.  Blood work showed WBC 32 with left shift, WBC morphology unremarkable,, lactic acid 2.5, proBNP 1000. Patient was given IV fentanyl , cefepime  and LR 1000 mL x 1.Admitted to hospitalist for sepsis d/t RML lobar CAP and COPD exacerbation.  12/27: NRB --> BiPAP --> now on HFNC 6L/min,  12/28: down to 5L, lung sounds are improved.     Consultants:  none  Procedures/Surgeries: none      ASSESSMENT & PLAN:   Sepsis d/t RML CAP - severe sepsis w/ hypoxia, elevation lactic acid  COPD exacerbation  Acute on chornic hypoxic respiratory failure  Other Ddx, patient seem to have low risk for aspiration.  He does have a newly found hiatal hernia being followed by GI.  CT chest showed no signs of empyema or lung abscess formation. He was recently  dx w/ lung hernia on L and has hurt to breathe for some time, probably reduced inspiration has contributed to PNA risk  Caution w/ IV fluids given hx CHF, will repeat fluids only if low BP  CAP coverage with ceftriaxone  doxycycline   Culture sputum Aformoterol + umeclidinium scheduled  as needed levalbuterol  to avoid worsening of tachycardia. IV solumedrol   No ICS w/ pneumonia incentive spirometry. O2 supplementation as needed, wean as able  repeat CT chest in 6 to 8 weeks to document resolution of the pneumonia. Hold methotrexate  for now    PAF Rate controlled afib today metoprolol  Pradaxa   CAD Pradaxa  (no ASA), statin, metoprolol  Low BP limits other stnadard meds       Class 1 obesity based on BMI: Body mass index is 32.34 kg/m.SABRA Significantly low or high BMI is associated with higher medical risk.  Underweight - under 18  overweight - 25 to 29 obese - 30 or more Class 1 obesity: BMI of 30.0 to 34 Class 2 obesity: BMI of 35.0 to 39 Class 3 obesity: BMI of 40.0 to 49 Super Morbid Obesity: BMI 50-59 Super-super Morbid Obesity: BMI 60+ Healthy nutrition and physical activity advised as adjunct to other disease management and risk reduction treatments    DVT prophylaxis: pradaxa  IV fluids: no continuous IV fluids  Nutrition: cardiac Central lines / other devices: none  Code Status: FULL CODE ACP documentation reviewed: none on file in [facility]  TOC needs: TBD Medical barriers to dispo: hypoxic respiraotry failure. Expected medical readiness for discharge few  days.              Subjective / Brief ROS:  Patient reports breathing today is a bit better  Denies CP/SOB at rest  Pain controlled.  Denies new weakness.  Tolerating diet.  Reports no concerns w/ urination/defecation.   Family Communication: support person at bedside on rounds     Objective Findings:  Vitals:   12/14/24 0740 12/14/24 0805 12/14/24 1219 12/14/24 1541  BP: (!) 140/59  124/64  135/61  Pulse:    85  Resp: (!) 22   20  Temp: 97.8 F (36.6 C)  97.6 F (36.4 C) 97.8 F (36.6 C)  TempSrc: Oral  Oral Oral  SpO2: 90% 96%  94%  Weight:      Height:        Intake/Output Summary (Last 24 hours) at 12/14/2024 1607 Last data filed at 12/14/2024 1320 Gross per 24 hour  Intake 960 ml  Output 1625 ml  Net -665 ml   Filed Weights   12/12/24 0545  Weight: 80.2 kg    Examination:  Physical Exam Constitutional:      General: He is not in acute distress. Cardiovascular:     Rate and Rhythm: Normal rate.     Heart sounds: Murmur heard.  Pulmonary:     Breath sounds: Examination of the right-lower field reveals decreased breath sounds. Examination of the left-lower field reveals decreased breath sounds. Decreased breath sounds and wheezing (scattered, improved from yesterday) present. No rhonchi.  Musculoskeletal:     Right lower leg: No edema.     Left lower leg: No edema.  Skin:    General: Skin is warm and dry.  Neurological:     Mental Status: He is alert and oriented to person, place, and time.  Psychiatric:        Mood and Affect: Mood normal.        Behavior: Behavior normal.          Scheduled Medications:   arformoterol   15 mcg Nebulization BID   And   umeclidinium bromide   1 puff Inhalation Daily   atorvastatin   40 mg Oral QHS   dabigatran   150 mg Oral BID   doxycycline   100 mg Oral Q12H   levalbuterol   0.63 mg Nebulization BID   melatonin  5 mg Oral QHS   methylPREDNISolone  (SOLU-MEDROL ) injection  40 mg Intravenous Q12H   metoprolol   100 mg Oral Daily   pantoprazole   40 mg Oral Daily    Continuous Infusions:  cefTRIAXone  (ROCEPHIN )  IV 2 g (12/14/24 0917)    PRN Medications:  fluticasone , gabapentin , guaiFENesin -dextromethorphan , labetalol , oxyCODONE , polyethylene glycol, simethicone , traZODone   Antimicrobials from admission:  Anti-infectives (From admission, onward)    Start     Dose/Rate Route Frequency Ordered Stop    12/13/24 1000  cefTRIAXone  (ROCEPHIN ) 2 g in sodium chloride  0.9 % 100 mL IVPB        2 g 200 mL/hr over 30 Minutes Intravenous Every 24 hours 12/12/24 0841 12/18/24 0959   12/12/24 1000  doxycycline  (VIBRA -TABS) tablet 100 mg        100 mg Oral Every 12 hours 12/12/24 0841 12/17/24 0959   12/12/24 0700  vancomycin  (VANCOCIN ) IVPB 1000 mg/200 mL premix  Status:  Discontinued        1,000 mg 200 mL/hr over 60 Minutes Intravenous  Once 12/12/24 0652 12/12/24 0654   12/12/24 0700  ceFEPIme  (MAXIPIME ) 2 g in sodium chloride  0.9 % 100 mL IVPB  2 g 200 mL/hr over 30 Minutes Intravenous  Once 12/12/24 0652 12/12/24 0745   12/12/24 0700  vancomycin  (VANCOREADY) IVPB 2000 mg/400 mL        2,000 mg 200 mL/hr over 120 Minutes Intravenous  Once 12/12/24 0654 12/12/24 0944           Data Reviewed:  I have personally reviewed the following...  CBC: Recent Labs  Lab 12/12/24 0545 12/13/24 0405 12/14/24 0358  WBC 32.1* 34.2* 15.3*  NEUTROABS 26.3*  --   --   HGB 15.5 13.5 12.9*  HCT 49.7 43.7 40.6  MCV 94.5 95.6 92.7  PLT 270 195 184   Basic Metabolic Panel: Recent Labs  Lab 12/12/24 0545 12/13/24 0405 12/14/24 0358  NA 137 135 137  K 4.4 4.2 5.1  CL 100 95* 100  CO2 25 26 27   GLUCOSE 153* 112* 137*  BUN 18 25* 29*  CREATININE 1.20 1.33* 1.17  CALCIUM  8.8* 8.7* 8.8*   GFR: Estimated Creatinine Clearance: 51.5 mL/min (by C-G formula based on SCr of 1.17 mg/dL). Liver Function Tests: Recent Labs  Lab 12/12/24 0545  AST 21  ALT 14  ALKPHOS 117  BILITOT 0.9  PROT 6.9  ALBUMIN 4.3   Recent Labs  Lab 12/12/24 0545  LIPASE 26   No results for input(s): AMMONIA in the last 168 hours. Coagulation Profile: No results for input(s): INR, PROTIME in the last 168 hours. Cardiac Enzymes: No results for input(s): CKTOTAL, CKMB, CKMBINDEX, TROPONINI in the last 168 hours. BNP (last 3 results) Recent Labs    12/12/24 0545  PROBNP 1,055.0*    HbA1C: No results for input(s): HGBA1C in the last 72 hours. CBG: No results for input(s): GLUCAP in the last 168 hours. Lipid Profile: No results for input(s): CHOL, HDL, LDLCALC, TRIG, CHOLHDL, LDLDIRECT in the last 72 hours. Thyroid  Function Tests: No results for input(s): TSH, T4TOTAL, FREET4, T3FREE, THYROIDAB in the last 72 hours. Anemia Panel: No results for input(s): VITAMINB12, FOLATE, FERRITIN, TIBC, IRON, RETICCTPCT in the last 72 hours. Most Recent Urinalysis On File:     Component Value Date/Time   COLORURINE YELLOW (A) 08/17/2022 0254   APPEARANCEUR CLEAR (A) 08/17/2022 0254   APPEARANCEUR Clear 02/04/2013 1430   LABSPEC 1.011 08/17/2022 0254   LABSPEC 1.004 02/04/2013 1430   PHURINE 5.0 08/17/2022 0254   GLUCOSEU NEGATIVE 08/17/2022 0254   GLUCOSEU Negative 02/04/2013 1430   HGBUR NEGATIVE 08/17/2022 0254   BILIRUBINUR NEGATIVE 08/17/2022 0254   BILIRUBINUR Negative 02/04/2013 1430   KETONESUR NEGATIVE 08/17/2022 0254   PROTEINUR NEGATIVE 08/17/2022 0254   NITRITE NEGATIVE 08/17/2022 0254   LEUKOCYTESUR NEGATIVE 08/17/2022 0254   LEUKOCYTESUR Negative 02/04/2013 1430   Sepsis Labs: @LABRCNTIP (procalcitonin:4,lacticidven:4) Microbiology: Recent Results (from the past 240 hours)  Resp panel by RT-PCR (RSV, Flu A&B, Covid) Anterior Nasal Swab     Status: None   Collection Time: 12/12/24  5:40 AM   Specimen: Anterior Nasal Swab  Result Value Ref Range Status   SARS Coronavirus 2 by RT PCR NEGATIVE NEGATIVE Final    Comment: (NOTE) SARS-CoV-2 target nucleic acids are NOT DETECTED.  The SARS-CoV-2 RNA is generally detectable in upper respiratory specimens during the acute phase of infection. The lowest concentration of SARS-CoV-2 viral copies this assay can detect is 138 copies/mL. A negative result does not preclude SARS-Cov-2 infection and should not be used as the sole basis for treatment or other patient management  decisions. A negative result may occur with  improper specimen  collection/handling, submission of specimen other than nasopharyngeal swab, presence of viral mutation(s) within the areas targeted by this assay, and inadequate number of viral copies(<138 copies/mL). A negative result must be combined with clinical observations, patient history, and epidemiological information. The expected result is Negative.  Fact Sheet for Patients:  bloggercourse.com  Fact Sheet for Healthcare Providers:  seriousbroker.it  This test is no t yet approved or cleared by the [place]  FDA and  has been authorized for detection and/or diagnosis of SARS-CoV-2 by FDA under an Emergency Use Authorization (EUA). This EUA will remain  in effect (meaning this test can be used) for the duration of the COVID-19 declaration under Section 564(b)(1) of the Act, 21 U.S.C.section 360bbb-3(b)(1), unless the authorization is terminated  or revoked sooner.       Influenza A by PCR NEGATIVE NEGATIVE Final   Influenza B by PCR NEGATIVE NEGATIVE Final    Comment: (NOTE) The Xpert Xpress SARS-CoV-2/FLU/RSV plus assay is intended as an aid in the diagnosis of influenza from Nasopharyngeal swab specimens and should not be used as a sole basis for treatment. Nasal washings and aspirates are unacceptable for Xpert Xpress SARS-CoV-2/FLU/RSV testing.  Fact Sheet for Patients: bloggercourse.com  Fact Sheet for Healthcare Providers: seriousbroker.it  This test is not yet approved or cleared by the [place]  FDA and has been authorized for detection and/or diagnosis of SARS-CoV-2 by FDA under an Emergency Use Authorization (EUA). This EUA will remain in effect (meaning this test can be used) for the duration of the COVID-19 declaration under Section 564(b)(1) of the Act, 21 U.S.C. section 360bbb-3(b)(1), unless the  authorization is terminated or revoked.     Resp Syncytial Virus by PCR NEGATIVE NEGATIVE Final    Comment: (NOTE) Fact Sheet for Patients: bloggercourse.com  Fact Sheet for Healthcare Providers: seriousbroker.it  This test is not yet approved or cleared by the [place]  FDA and has been authorized for detection and/or diagnosis of SARS-CoV-2 by FDA under an Emergency Use Authorization (EUA). This EUA will remain in effect (meaning this test can be used) for the duration of the COVID-19 declaration under Section 564(b)(1) of the Act, 21 U.S.C. section 360bbb-3(b)(1), unless the authorization is terminated or revoked.  Performed at [facility], [address] [place], [place] [address]   Blood culture (routine single)     Status: None (Preliminary result)   Collection Time: 12/12/24  5:56 AM   Specimen: BLOOD  Result Value Ref Range Status   Specimen Description BLOOD BLOOD LEFT ARM  Final   Special Requests   Final    BOTTLES DRAWN AEROBIC AND ANAEROBIC Blood Culture adequate volume   Culture   Final    NO GROWTH 2 DAYS Performed at [facility], [address], [place], [place] [address]    Report Status PENDING  Incomplete  Culture, blood (single)     Status: None (Preliminary result)   Collection Time: 12/12/24  7:10 AM   Specimen: BLOOD  Result Value Ref Range Status   Specimen Description BLOOD BLOOD RIGHT ARM  Final   Special Requests   Final    BOTTLES DRAWN AEROBIC AND ANAEROBIC Blood Culture results may not be optimal due to an inadequate volume of blood received in culture bottles   Culture   Final    NO GROWTH 2 DAYS Performed at [facility], [address] [place], [place] [address]    Report Status PENDING  Incomplete  Expectorated Sputum Assessment w Gram Stain,  Rflx to Resp Cult     Status: None   Collection Time: 12/12/24  9:59 PM   Specimen: Expectorated Sputum   Result Value Ref Range Status   Specimen Description EXPECTORATED SPUTUM  Final   Special Requests NONE  Final   Sputum evaluation   Final    Sputum specimen not acceptable for testing.  Please recollect.   CALLED TO ALEX ASIDO @ 0110 ON 12/13/2024 BY CAF Performed at [facility], [address] [place], [place] [address]    Report Status 12/13/2024 FINAL  Final      Radiology Studies last 3 days: CT CHEST WO CONTRAST Result Date: 12/12/2024 EXAM: CT CHEST WITHOUT CONTRAST 12/12/2024 09:06:11 AM TECHNIQUE: CT of the chest was performed without the administration of intravenous contrast. Multiplanar reformatted images are provided for review. Automated exposure control, iterative reconstruction, and/or weight based adjustment of the mA/kV was utilized to reduce the radiation dose to as low as reasonably achievable. COMPARISON: Chest CT 03/13/last year. CLINICAL HISTORY: 74 year old male with pneumonia, suspected complication, and new lung base opacity on radiographs today. FINDINGS: MEDIASTINUM: Heart size is normal. No pericardial effusion. Advanced calcified aortic and left coronary artery atherosclerosis. New retained secretions at the carina and in the mainstem bronchi. LYMPH NODES: No mediastinal, hilar or axillary lymphadenopathy. LUNGS AND PLEURA: [place]heath trachea contour redemonstrated (series 4 image 34). New retained secretions at the carina and in the mainstem bronchi. Emphysema redemonstrated. Consolidation throughout the right middle lobe, specifically the lateral segment. Patchy adjacent right upper and lower lobe opacity. Small or trace layering right pleural effusion. No acute left lung base opacity. New lung herniation now in the left posterolateral intercostal space of ribs 9 and 10 (series 4 image 133). No pneumothorax. BONES: No acute osseous abnormality. UPPER ABDOMEN: Limited images of the upper abdomen demonstrate negative visible non-contrast upper abdominal  viscera. IMPRESSION: 1. Right middle lobe consolidated Pneumonia, with early infection in the adjacent lobes. Small volume retained secretions in the central airways. Small or trace layering right pleural effusion. 2. Underlying Emphysema and COPD. New since last year left lower lung herniation in the left posterolateral intercostal space of ribs 9 and 10. Electronically signed by: Helayne Hurst MD 12/12/2024 09:16 AM EST RP Workstation: [id number]   DG Chest Port 1 View Result Date: 12/12/2024 CLINICAL DATA:  Questionable sepsis. EXAM: PORTABLE CHEST 1 VIEW COMPARISON:  01/12/2024 FINDINGS: Interval development of patchy and confluent airspace disease in the right lower lung consistent with pneumonia. Similar patchy opacity at the left base is likely chronic. Bullous changes with underlying emphysema noted. Cardiopericardial silhouette is at upper limits of normal for size. No acute bony abnormality. Telemetry leads overlie the chest. IMPRESSION: Interval development of patchy and confluent airspace disease in the right lower lung consistent with pneumonia. Electronically Signed   By: Camellia Candle M.D.   On: 12/12/2024 06:31          Kailie Polus, DO [facility] 12/14/2024, 4:07 PM    Dictation software may have been used to generate the above note. Typos may occur and escape review in typed/dictated notes. Please contact Dr Marsa directly for clarity if needed.  Staff may message me via secure chat in Epic  but this may not receive an immediate response,  please page me for urgent matters!  If 7PM-7AM, please contact night coverage [email or web address]       "

## 2024-12-14 NOTE — Plan of Care (Signed)

## 2024-12-14 NOTE — TOC Initial Note (Signed)
 Transition of Care [facility]) - Initial/Assessment Note    Patient Details  Name: Darius Norris MRN: [id number] Date of Birth: 1951-03-10  Transition of Care [facility]) CM/SW Contact:    Danella Philson L Shruthi Northrup, LCSW Phone Number: 12/14/2024, 1:15 PM  Clinical Narrative:                  Brief assessment completed. Spouse at bedside. Recommendations for SNF discussed. Patient declined SNF. He reports he has home health services already set up through the [facility]. He stated that he got sick before services started but once discharged he will resume.   Patient reports he has a ramp at home and DME.        Patient Goals and CMS Choice            Expected Discharge Plan and Services                                              Prior Living Arrangements/Services                       Activities of Daily Living   ADL Screening (condition at time of admission) Independently performs ADLs?: Yes (appropriate for developmental age) Is the patient deaf or have difficulty hearing?: No Does the patient have difficulty seeing, even when wearing glasses/contacts?: No Does the patient have difficulty concentrating, remembering, or making decisions?: No  Permission Sought/Granted                  Emotional Assessment              Admission diagnosis:  Pneumonia [J18.9] COPD exacerbation (HCC) [J44.1] Sepsis with acute hypoxic respiratory failure without septic shock, due to unspecified organism (HCC) [A41.9, R65.20, J96.01] Patient Active Problem List   Diagnosis Date Noted   Pneumonia 12/12/2024   Malnutrition of moderate degree 01/15/2024   COPD exacerbation (HCC) 01/12/2024   CAP (community acquired pneumonia) 08/24/2023   Dyslipidemia 08/24/2023   Paroxysmal atrial fibrillation (HCC) 08/24/2023   GERD without esophagitis 08/24/2023   Coronary artery disease 08/24/2023   Myocardial injury 01/03/2023   Type 2 diabetes mellitus with hyperlipidemia (HCC)  01/03/2023   Obesity (BMI 30-39.9) 01/03/2023   Aspiration pneumonia (HCC) 05/19/2022   Severe sepsis (HCC) 05/19/2022   Diarrhea 05/19/2022   Chronic diastolic CHF (congestive heart failure) (HCC) 05/17/2022   Paroxysmal atrial fibrillation with RVR (HCC) 05/16/2022   Rheumatoid arthritis (HCC) 05/16/2022   Age-related nuclear cataract, bilateral 02/08/2022   Cortical age-related cataract, right eye 02/08/2022   Encounter for observation for other suspected diseases and conditions ruled out 02/08/2022   COVID-19 02/08/2022   Diverticulitis of colon 02/08/2022   Dysphagia, oropharyngeal phase 02/08/2022   Encounter for surgical aftercare following surgery on the sense organs 02/08/2022   Hypertensive heart disease without heart failure 02/08/2022   Oral lesion 02/08/2022   Other age-related cataract 02/08/2022   Other B-complex deficiencies 02/08/2022   Other bursitis of elbow, right elbow 02/08/2022   Postnasal drip 02/08/2022   Rheumatism and fibrositis 02/08/2022   Tinnitus 02/08/2022   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) 02/08/2022   Atrial fibrillation with RVR (HCC) 12/05/2021   HLD (hyperlipidemia) 12/05/2021   COPD (chronic obstructive pulmonary disease) (HCC) 12/05/2021   Depression with anxiety 12/05/2021   Psoriasis 12/05/2021   Carotid atherosclerosis, bilateral 06/04/2020  Chronic hypoxic respiratory failure (HCC) 05/28/2018   A-fib (HCC) 05/02/2018   Chest pain 11/17/2017   Unstable angina (HCC) 09/04/2016   Essential hypertension 09/04/2016   Hypokalemia 09/04/2016   Leukocytosis 09/04/2016   COPD with acute exacerbation (HCC) 12/15/2015   Elevated troponin 12/15/2015   HTN (hypertension) 12/15/2015   GERD (gastroesophageal reflux disease) 12/15/2015   CAD (coronary artery disease) 12/15/2015   Angina pectoris 12/15/2015   Benign neoplasm of colon 12/18/2001   Migraine 12/18/2000   Nephritis and nephropathy, 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 [place] [address] Phone: [phone] Fax: [phone]     Social Drivers of Health (SDOH) Social History: SDOH Screenings   Food Insecurity: No Food Insecurity (12/13/2024)  Housing: Low Risk (12/13/2024)  Transportation Needs: No Transportation Needs (12/13/2024)  Utilities: Not At Risk (12/13/2024)  Depression (PHQ2-9): Low Risk (10/10/2023)  Social Connections: Moderately Isolated (12/13/2024)  Tobacco Use: Medium Risk (12/12/2024)   SDOH Interventions:     Readmission Risk Interventions    01/14/2024    1:28 PM 08/26/2023   12:35 PM  Readmission Risk Prevention Plan  Transportation Screening Complete Complete  PCP or Specialist Appt within 5-7 Days  Complete  PCP or Specialist Appt within 3-5 Days Complete   Home Care Screening  Complete  Medication Review (RN CM)  Complete  Social Work Consult for Recovery Care Planning/Counseling Complete   Palliative Care Screening Not Applicable   Medication Review Oceanographer) Complete

## 2024-12-15 DIAGNOSIS — J189 Pneumonia, unspecified organism: Secondary | ICD-10-CM | POA: Diagnosis not present

## 2024-12-15 MED ORDER — PREDNISONE 20 MG PO TABS
40.0000 mg | ORAL_TABLET | Freq: Every day | ORAL | Status: DC
  Administered 2024-12-16 – 2024-12-18 (×3): 40 mg via ORAL
  Filled 2024-12-15 (×3): qty 2

## 2024-12-15 MED ORDER — HYDROXYZINE HCL 25 MG PO TABS
25.0000 mg | ORAL_TABLET | Freq: Once | ORAL | Status: AC
  Administered 2024-12-15: 25 mg via ORAL
  Filled 2024-12-15: qty 1

## 2024-12-15 MED ORDER — GABAPENTIN 300 MG PO CAPS
600.0000 mg | ORAL_CAPSULE | Freq: Three times a day (TID) | ORAL | Status: DC | PRN
  Administered 2024-12-15: 600 mg via ORAL
  Filled 2024-12-15: qty 2

## 2024-12-15 NOTE — Progress Notes (Signed)
 " PROGRESS NOTE    Darius Norris   FMW:[id number] DOB: 1951-06-25  DOA: 12/12/2024 Date of Service: 12/15/2024 which is hospital day 3  PCP: [facility], [facility] course / significant events:   HPI: Darius Norris is a 73 y.o. 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 Symptoms started 4 weeks ago when patient started to develop wheezing productive cough with occasional coughing up yellowish phlegm, last few days patient also started develop right-sided chest pain, worsening with cough and deep breath, he has no fever or chills.  Last night, more shortness of breath and could not sleep, and family called EMS.  EMS arrived and found patient more hypoxic and gave patient Solu-Medrol  and albuterol  treatment.  12/26: to ED. Afebrile, tachycardia heart rate in the 110s, tachypneic blood pressure 124/81 O2 saturation 99% on BiPAP 15/840%.  Chest x-ray showed right-sided lower field infiltrates.  CT chest without contrast showed right middle lobe infiltrates and consolidation.  Blood work showed WBC 32 with left shift, WBC morphology unremarkable,, lactic acid 2.5, proBNP 1000. Patient was given IV fentanyl , cefepime  and LR 1000 mL x 1.Admitted to hospitalist for sepsis d/t RML lobar CAP and COPD exacerbation.  12/27: NRB --> BiPAP --> now on HFNC 6L/min,  12/28: down to 5L, lung sounds are improved.  12/29: down to 4L.     Consultants:  none  Procedures/Surgeries: none      ASSESSMENT & PLAN:   Sepsis d/t RML CAP - severe sepsis w/ hypoxia, elevation lactic acid  COPD exacerbation  Acute on chornic hypoxic respiratory failure  Other Ddx, patient seem to have low risk for aspiration.  He does have a newly found hiatal hernia being followed by GI.  CT chest showed no signs of empyema or lung abscess  formation. He was recently dx w/ lung hernia on L and has hurt to breathe for some time, probably reduced inspiration has contributed to PNA risk  Caution w/ IV fluids given hx CHF, will repeat fluids only if low BP  CAP coverage with ceftriaxone  doxycycline   Aformoterol + umeclidinium scheduled  as needed levalbuterol  to avoid worsening tachycardia. IV solumedrol  --> prednisone   No ICS w/ pneumonia incentive spirometry. O2 supplementation as needed, wean as able  repeat CT chest in 6 to 8 weeks to document resolution of the pneumonia. Hold methotrexate  for now    PAF Rate controlled afib today metoprolol  Pradaxa   CAD Pradaxa  (no ASA), statin, metoprolol  Low BP limits other stnadard meds       Class 1 obesity based on BMI: Body mass index is 32.34 kg/m.SABRA Significantly low or high BMI is associated with higher medical risk.  Underweight - under 18  overweight - 25 to 29 obese - 30 or more Class 1 obesity: BMI of 30.0 to 34 Class 2 obesity: BMI of 35.0 to 39 Class 3 obesity: BMI of 40.0 to 49 Super Morbid Obesity: BMI 50-59 Super-super Morbid Obesity: BMI 60+ Healthy nutrition and physical activity advised as adjunct to other disease management and risk reduction treatments    DVT prophylaxis: pradaxa  IV fluids: no continuous IV fluids  Nutrition: cardiac Central lines / other devices: none  Code Status: FULL CODE ACP documentation reviewed: none on file in [facility]  [facility] needs: Home health, patient has declined skilled nursing facility for rehab  Medical barriers to dispo: hypoxic respiraotry failure. Expected medical readiness for discharge tomorrow             Subjective / Brief ROS:  Patient reports breathing today is a bit better, still some cough, slightly better compared to yesterday.  He is nervous about going home. Denies CP/SOB at rest  Has not been out of bed much, some SOB on exertion. Pain controlled.  Denies new weakness.  Tolerating diet.   Reports no concerns w/ urination/defecation.   Family Communication: support person at bedside on rounds     Objective Findings:  Vitals:   12/15/24 0042 12/15/24 0347 12/15/24 0842 12/15/24 1321  BP: 134/69 (!) 157/79 125/70 138/74  Pulse:  93 86   Resp: 20 (!) 21 18   Temp: 97.8 F (36.6 C) 97.6 F (36.4 C) 97.7 F (36.5 C) 98.2 F (36.8 C)  TempSrc:  Oral  Oral  SpO2:  94% 90%   Weight:      Height:        Intake/Output Summary (Last 24 hours) at 12/15/2024 1723 Last data filed at 12/15/2024 1409 Gross per 24 hour  Intake 1440 ml  Output 950 ml  Net 490 ml   Filed Weights   12/12/24 0545  Weight: 80.2 kg    Examination:  Physical Exam Constitutional:      General: He is not in acute distress. Cardiovascular:     Rate and Rhythm: Normal rate.     Heart sounds: Murmur heard.  Pulmonary:     Breath sounds: Examination of the right-lower field reveals decreased breath sounds. Examination of the left-lower field reveals decreased breath sounds. Decreased breath sounds and wheezing (scattered) present. No rhonchi.  Musculoskeletal:     Right lower leg: No edema.     Left lower leg: No edema.  Skin:    General: Skin is warm and dry.  Neurological:     Mental Status: He is alert and oriented to person, place, and time.  Psychiatric:        Mood and Affect: Mood normal.        Behavior: Behavior normal.          Scheduled Medications:   arformoterol   15 mcg Nebulization BID   And   umeclidinium bromide   1 puff Inhalation Daily   atorvastatin   40 mg Oral QHS   dabigatran   150 mg Oral BID   doxycycline   100 mg Oral Q12H   levalbuterol   0.63 mg Nebulization BID   melatonin  5 mg Oral QHS   metoprolol   100 mg Oral Daily   pantoprazole   40 mg Oral Daily   [START ON 12/16/2024] predniSONE   40 mg Oral Q breakfast    Continuous Infusions:  cefTRIAXone  (ROCEPHIN )  IV 2 g (12/15/24 0953)    PRN Medications:  fluticasone , gabapentin ,  guaiFENesin -dextromethorphan , labetalol , oxyCODONE , polyethylene glycol, simethicone , traZODone   Antimicrobials from admission:  Anti-infectives (From admission, onward)    Start     Dose/Rate Route Frequency Ordered Stop   12/13/24 1000  cefTRIAXone  (ROCEPHIN ) 2 g in sodium chloride  0.9 % 100 mL IVPB        2 g 200 mL/hr over 30 Minutes Intravenous Every 24 hours 12/12/24 0841 12/18/24 0959   12/12/24 1000  doxycycline  (VIBRA -TABS) tablet 100 mg        100 mg Oral Every 12 hours 12/12/24 0841 12/17/24 0959   12/12/24 0700  vancomycin  (VANCOCIN ) IVPB 1000 mg/200 mL premix  Status:  Discontinued  1,000 mg 200 mL/hr over 60 Minutes Intravenous  Once 12/12/24 0652 12/12/24 0654   12/12/24 0700  ceFEPIme  (MAXIPIME ) 2 g in sodium chloride  0.9 % 100 mL IVPB        2 g 200 mL/hr over 30 Minutes Intravenous  Once 12/12/24 0652 12/12/24 0745   12/12/24 0700  vancomycin  (VANCOREADY) IVPB 2000 mg/400 mL        2,000 mg 200 mL/hr over 120 Minutes Intravenous  Once 12/12/24 0654 12/12/24 0944           Data Reviewed:  I have personally reviewed the following...  CBC: Recent Labs  Lab 12/12/24 0545 12/13/24 0405 12/14/24 0358  WBC 32.1* 34.2* 15.3*  NEUTROABS 26.3*  --   --   HGB 15.5 13.5 12.9*  HCT 49.7 43.7 40.6  MCV 94.5 95.6 92.7  PLT 270 195 184   Basic Metabolic Panel: Recent Labs  Lab 12/12/24 0545 12/13/24 0405 12/14/24 0358  NA 137 135 137  K 4.4 4.2 5.1  CL 100 95* 100  CO2 25 26 27   GLUCOSE 153* 112* 137*  BUN 18 25* 29*  CREATININE 1.20 1.33* 1.17  CALCIUM  8.8* 8.7* 8.8*   GFR: Estimated Creatinine Clearance: 51.5 mL/min (by C-G formula based on SCr of 1.17 mg/dL). Liver Function Tests: Recent Labs  Lab 12/12/24 0545  AST 21  ALT 14  ALKPHOS 117  BILITOT 0.9  PROT 6.9  ALBUMIN 4.3   Recent Labs  Lab 12/12/24 0545  LIPASE 26   No results for input(s): AMMONIA in the last 168 hours. Coagulation Profile: No results for input(s):  INR, PROTIME in the last 168 hours. Cardiac Enzymes: No results for input(s): CKTOTAL, CKMB, CKMBINDEX, TROPONINI in the last 168 hours. BNP (last 3 results) Recent Labs    12/12/24 0545  PROBNP 1,055.0*   HbA1C: No results for input(s): HGBA1C in the last 72 hours. CBG: No results for input(s): GLUCAP in the last 168 hours. Lipid Profile: No results for input(s): CHOL, HDL, LDLCALC, TRIG, CHOLHDL, LDLDIRECT in the last 72 hours. Thyroid  Function Tests: No results for input(s): TSH, T4TOTAL, FREET4, T3FREE, THYROIDAB in the last 72 hours. Anemia Panel: No results for input(s): VITAMINB12, FOLATE, FERRITIN, TIBC, IRON, RETICCTPCT in the last 72 hours. Most Recent Urinalysis On File:     Component Value Date/Time   COLORURINE YELLOW (A) 08/17/2022 0254   APPEARANCEUR CLEAR (A) 08/17/2022 0254   APPEARANCEUR Clear 02/04/2013 1430   LABSPEC 1.011 08/17/2022 0254   LABSPEC 1.004 02/04/2013 1430   PHURINE 5.0 08/17/2022 0254   GLUCOSEU NEGATIVE 08/17/2022 0254   GLUCOSEU Negative 02/04/2013 1430   HGBUR NEGATIVE 08/17/2022 0254   BILIRUBINUR NEGATIVE 08/17/2022 0254   BILIRUBINUR Negative 02/04/2013 1430   KETONESUR NEGATIVE 08/17/2022 0254   PROTEINUR NEGATIVE 08/17/2022 0254   NITRITE NEGATIVE 08/17/2022 0254   LEUKOCYTESUR NEGATIVE 08/17/2022 0254   LEUKOCYTESUR Negative 02/04/2013 1430   Sepsis Labs: @LABRCNTIP (procalcitonin:4,lacticidven:4) Microbiology: Recent Results (from the past 240 hours)  Resp panel by RT-PCR (RSV, Flu A&B, Covid) Anterior Nasal Swab     Status: None   Collection Time: 12/12/24  5:40 AM   Specimen: Anterior Nasal Swab  Result Value Ref Range Status   SARS Coronavirus 2 by RT PCR NEGATIVE NEGATIVE Final    Comment: (NOTE) SARS-CoV-2 target nucleic acids are NOT DETECTED.  The SARS-CoV-2 RNA is generally detectable in upper respiratory specimens during the acute phase of infection. The  lowest concentration of SARS-CoV-2 viral copies this assay  can detect is 138 copies/mL. A negative result does not preclude SARS-Cov-2 infection and should not be used as the sole basis for treatment or other patient management decisions. A negative result may occur with  improper specimen collection/handling, submission of specimen other than nasopharyngeal swab, presence of viral mutation(s) within the areas targeted by this assay, and inadequate number of viral copies(<138 copies/mL). A negative result must be combined with clinical observations, patient history, and epidemiological information. The expected result is Negative.  Fact Sheet for Patients:  bloggercourse.com  Fact Sheet for Healthcare Providers:  seriousbroker.it  This test is no t yet approved or cleared by the [place]  FDA and  has been authorized for detection and/or diagnosis of SARS-CoV-2 by FDA under an Emergency Use Authorization (EUA). This EUA will remain  in effect (meaning this test can be used) for the duration of the COVID-19 declaration under Section 564(b)(1) of the Act, 21 U.S.C.section 360bbb-3(b)(1), unless the authorization is terminated  or revoked sooner.       Influenza A by PCR NEGATIVE NEGATIVE Final   Influenza B by PCR NEGATIVE NEGATIVE Final    Comment: (NOTE) The Xpert Xpress SARS-CoV-2/FLU/RSV plus assay is intended as an aid in the diagnosis of influenza from Nasopharyngeal swab specimens and should not be used as a sole basis for treatment. Nasal washings and aspirates are unacceptable for Xpert Xpress SARS-CoV-2/FLU/RSV testing.  Fact Sheet for Patients: bloggercourse.com  Fact Sheet for Healthcare Providers: seriousbroker.it  This test is not yet approved or cleared by the [place]  FDA and has been authorized for detection and/or diagnosis of SARS-CoV-2 by FDA under  an Emergency Use Authorization (EUA). This EUA will remain in effect (meaning this test can be used) for the duration of the COVID-19 declaration under Section 564(b)(1) of the Act, 21 U.S.C. section 360bbb-3(b)(1), unless the authorization is terminated or revoked.     Resp Syncytial Virus by PCR NEGATIVE NEGATIVE Final    Comment: (NOTE) Fact Sheet for Patients: bloggercourse.com  Fact Sheet for Healthcare Providers: seriousbroker.it  This test is not yet approved or cleared by the [place]  FDA and has been authorized for detection and/or diagnosis of SARS-CoV-2 by FDA under an Emergency Use Authorization (EUA). This EUA will remain in effect (meaning this test can be used) for the duration of the COVID-19 declaration under Section 564(b)(1) of the Act, 21 U.S.C. section 360bbb-3(b)(1), unless the authorization is terminated or revoked.  Performed at [facility], [address] [place], [place] [address]   Blood culture (routine single)     Status: None (Preliminary result)   Collection Time: 12/12/24  5:56 AM   Specimen: BLOOD  Result Value Ref Range Status   Specimen Description BLOOD BLOOD LEFT ARM  Final   Special Requests   Final    BOTTLES DRAWN AEROBIC AND ANAEROBIC Blood Culture adequate volume   Culture   Final    NO GROWTH 3 DAYS Performed at [facility], [address], [place], [place] [address]    Report Status PENDING  Incomplete  Culture, blood (single)     Status: None (Preliminary result)   Collection Time: 12/12/24  7:10 AM   Specimen: BLOOD  Result Value Ref Range Status   Specimen Description BLOOD BLOOD RIGHT ARM  Final   Special Requests   Final    BOTTLES DRAWN AEROBIC AND ANAEROBIC Blood Culture results may not be optimal due to an inadequate volume of blood received in culture bottles  Culture   Final    NO GROWTH 3 DAYS Performed at [facility], [address] [place], [place] [address]    Report Status PENDING  Incomplete  Expectorated Sputum Assessment w Gram Stain, Rflx to Resp Cult     Status: None   Collection Time: 12/12/24  9:59 PM   Specimen: Expectorated Sputum  Result Value Ref Range Status   Specimen Description EXPECTORATED SPUTUM  Final   Special Requests NONE  Final   Sputum evaluation   Final    Sputum specimen not acceptable for testing.  Please recollect.   CALLED TO ALEX ASIDO @ 0110 ON 12/13/2024 BY CAF Performed at [facility], [address] [place] [place], [place] [address]    Report Status 12/13/2024 FINAL  Final      Radiology Studies last 3 days: CT CHEST WO CONTRAST Result Date: 12/12/2024 EXAM: CT CHEST WITHOUT CONTRAST 12/12/2024 09:06:11 AM TECHNIQUE: CT of the chest was performed without the administration of intravenous contrast. Multiplanar reformatted images are provided for review. Automated exposure control, iterative reconstruction, and/or weight based adjustment of the mA/kV was utilized to reduce the radiation dose to as low as reasonably achievable. COMPARISON: Chest CT 03/13/last year. CLINICAL HISTORY: 74 year old male with pneumonia, suspected complication, and new lung base opacity on radiographs today. FINDINGS: MEDIASTINUM: Heart size is normal. No pericardial effusion. Advanced calcified aortic and left coronary artery atherosclerosis. New retained secretions at the carina and in the mainstem bronchi. LYMPH NODES: No mediastinal, hilar or axillary lymphadenopathy. LUNGS AND PLEURA: [place]heath trachea contour redemonstrated (series 4 image 34). New retained secretions at the carina and in the mainstem bronchi. Emphysema redemonstrated. Consolidation throughout the right middle lobe, specifically the lateral segment. Patchy adjacent right upper and lower lobe opacity. Small or trace layering right pleural effusion. No acute left lung base opacity. New lung herniation now in the left  posterolateral intercostal space of ribs 9 and 10 (series 4 image 133). No pneumothorax. BONES: No acute osseous abnormality. UPPER ABDOMEN: Limited images of the upper abdomen demonstrate negative visible non-contrast upper abdominal viscera. IMPRESSION: 1. Right middle lobe consolidated Pneumonia, with early infection in the adjacent lobes. Small volume retained secretions in the central airways. Small or trace layering right pleural effusion. 2. Underlying Emphysema and COPD. New since last year left lower lung herniation in the left posterolateral intercostal space of ribs 9 and 10. Electronically signed by: Helayne Hurst MD 12/12/2024 09:16 AM EST RP Workstation: [id number]   DG Chest Port 1 View Result Date: 12/12/2024 CLINICAL DATA:  Questionable sepsis. EXAM: PORTABLE CHEST 1 VIEW COMPARISON:  01/12/2024 FINDINGS: Interval development of patchy and confluent airspace disease in the right lower lung consistent with pneumonia. Similar patchy opacity at the left base is likely chronic. Bullous changes with underlying emphysema noted. Cardiopericardial silhouette is at upper limits of normal for size. No acute bony abnormality. Telemetry leads overlie the chest. IMPRESSION: Interval development of patchy and confluent airspace disease in the right lower lung consistent with pneumonia. Electronically Signed   By: Camellia Candle M.D.   On: 12/12/2024 06:31          Sirius Woodford, DO [facility] 12/15/2024, 5:23 PM    Dictation software may have been used to generate the above note. Typos may occur and escape review in typed/dictated notes. Please contact Dr Marsa directly for clarity if needed.  Staff may message me via secure chat in Epic  but this may not receive an immediate response,  please page me for urgent matters!  If 7PM-7AM, please contact night coverage [email or web address]       "

## 2024-12-15 NOTE — Progress Notes (Signed)
 Physical Therapy Treatment Patient Details Name: Darius Norris MRN: [id number] DOB: December 06, 1951 Today's Date: 12/15/2024   History of Present Illness Pt is a 73 y.o. 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  MD assessment includes: CAP, sepsis, acute hypoxic respiratory failure, and acute COPD exacerbation.    PT Comments  Pt was pleasant and motivated to participate during the session and put forth good effort throughout. Pt continued to require extra time, effort, and rocking attempts to come to standing but required no physical assistance this session.  Pt was able to amb 2 x 12 feet with seated therapeutic rest breaks between bouts with SpO2 in the mid 90s on 3L.  Of note unable to get good SpO2 reading initially due to poor pleth but nursing provided ear sensor for second bout of amb with good pleth.  Pt continues to present with significant deficits in activity tolerance and functional strength and will benefit from continued PT services upon discharge to safely address deficits listed in patient problem list for decreased caregiver assistance and eventual return to PLOF.      If plan is discharge home, recommend the following: A lot of help with walking and/or transfers;A little help with bathing/dressing/bathroom;Assistance with cooking/housework;Assist for transportation;Help with stairs or ramp for entrance   Can travel by private vehicle     No  Equipment Recommendations  Other (comment) (BSC if discharges home)    Recommendations for Other Services       Precautions / Restrictions Precautions Precautions: Fall Restrictions Weight Bearing Restrictions Per Provider Order: No     Mobility  Bed Mobility               General bed mobility comments: NT, pt found sitting at EOB pre-post session     Transfers Overall transfer level: Needs assistance Equipment used: Rolling walker (2 wheels) Transfers: Sit to/from Stand Sit to Stand: Contact guard assist           General transfer comment: Multiple rocking attempts to come to standing but no physical assistance required this session    Ambulation/Gait Ambulation/Gait assistance: Contact guard assist Gait Distance (Feet): 12 Feet x 2 Assistive device: Rolling walker (2 wheels) Gait Pattern/deviations: Step-to pattern, Trunk flexed, Decreased step length - right, Decreased step length - left Gait velocity: decreased     General Gait Details: Slow cadence with short B step length but steady with no overt LOB; mod verbal cues for amb closer to the RW and within the RW during sharp, slow turns for improved safety   Stairs             Wheelchair Mobility     Tilt Bed    Modified Rankin (Stroke Patients Only)       Balance Overall balance assessment: Needs assistance   Sitting balance-Leahy Scale: Normal     Standing balance support: Bilateral upper extremity supported, During functional activity, Reliant on assistive device for balance Standing balance-Leahy Scale: Fair                              Hotel Manager: No apparent difficulties  Cognition Arousal: Alert Behavior During Therapy: WFL for tasks assessed/performed   PT - Cognitive impairments: No apparent impairments  Following commands: Intact      Cueing Cueing Techniques: Verbal cues, Tactile cues  Exercises      General Comments        Pertinent Vitals/Pain Pain Assessment Pain Assessment: No/denies pain    Home Living                          Prior Function            PT Goals (current goals can now be found in the care plan section) Progress towards PT goals: Progressing toward goals    Frequency    Min 3X/week      PT Plan       Co-evaluation              AM-PAC PT 6 Clicks Mobility   Outcome Measure  Help needed turning from your back to your side while in a flat bed without using bedrails?: A Little Help needed moving from lying on your back to sitting on the side of a flat bed without using bedrails?: A Little Help needed moving to and from a bed to a chair (including a wheelchair)?: A Little Help needed standing up from a chair using your arms (e.g., wheelchair or bedside chair)?: A Little Help needed to walk in hospital room?: A Little Help needed climbing 3-5 steps with a railing? : A Lot 6 Click Score: 17    End of Session Equipment Utilized During Treatment: Gait belt;Oxygen Activity Tolerance: Patient tolerated treatment well Patient left: in bed;with family/visitor present;with call bell/phone within reach Nurse Communication: Mobility status PT Visit Diagnosis: Unsteadiness on feet (R26.81);Difficulty in walking, not elsewhere classified (R26.2);Muscle weakness (generalized) (M62.81);Pain     Time: 1117-1140 PT Time Calculation (min) (ACUTE ONLY): 23 min  Charges:    $Gait Training: 8-22 mins $Therapeutic Activity: 8-22 mins PT General Charges $$ ACUTE PT VISIT: 1 Visit                     D. Scott Ronnita Paz PT, DPT 12/15/2024, 11:59 AM

## 2024-12-15 NOTE — Plan of Care (Signed)

## 2024-12-15 NOTE — Progress Notes (Signed)
 Notified Natalie NP about patient's lactic of 2.9 on 12/27, no cbg orders for patient on oral steroids and personal request for anti-anxiety medication. New orders placed.

## 2024-12-15 NOTE — TOC Progression Note (Signed)
 Transition of Care [facility]) - Progression Note    Patient Details  Name: Darius Norris MRN: [id number] Date of Birth: 09/25/1951  [facility] CM/SW Contact  Shasta DELENA Daring, RN Phone Number: 12/15/2024, 3:38 PM  Clinical Narrative:     [facility] with [facility] that patient has HH orders already established for PT, OT and nursing. [facility] plans to follow up with patient in new year, after he is dicharged from his procedure. Confirmed with agency rep that no new orders will be needed, unless something changes.  TOC should notify [facility] when patient is discharged.                    Expected Discharge Plan and Services                                               Social Drivers of Health (SDOH) Interventions SDOH Screenings   Food Insecurity: No Food Insecurity (12/13/2024)  Housing: Low Risk (12/13/2024)  Transportation Needs: No Transportation Needs (12/13/2024)  Utilities: Not At Risk (12/13/2024)  Depression (PHQ2-9): Low Risk (10/10/2023)  Social Connections: Moderately Isolated (12/13/2024)  Tobacco Use: Medium Risk (12/12/2024)    Readmission Risk Interventions    01/14/2024    1:28 PM 08/26/2023   12:35 PM  Readmission Risk Prevention Plan  Transportation Screening Complete Complete  PCP or Specialist Appt within 5-7 Days  Complete  PCP or Specialist Appt within 3-5 Days Complete   Home Care Screening  Complete  Medication Review (RN CM)  Complete  Social Work Consult for Recovery Care Planning/Counseling Complete   Palliative Care Screening Not Applicable   Medication Review Oceanographer) Complete

## 2024-12-15 NOTE — Plan of Care (Signed)
 VSS. 3L [place] w/ humidification. Family at bedside this shift. Patient worked with therapy. Possible d/c tomorrow.  Problem: Education: Goal: Knowledge of General Education information will improve Description: Including pain rating scale, medication(s)/side effects and non-pharmacologic comfort measures Outcome: Progressing   Problem: Health Behavior/Discharge Planning: Goal: Ability to manage health-related needs will improve Outcome: Progressing   Problem: Clinical Measurements: Goal: Ability to maintain clinical measurements within normal limits will improve Outcome: Progressing Goal: Will remain free from infection Outcome: Progressing Goal: Diagnostic test results will improve Outcome: Progressing Goal: Respiratory complications will improve Outcome: Progressing Goal: Cardiovascular complication will be avoided Outcome: Progressing   Problem: Activity: Goal: Risk for activity intolerance will decrease Outcome: Progressing   Problem: Nutrition: Goal: Adequate nutrition will be maintained Outcome: Progressing   Problem: Coping: Goal: Level of anxiety will decrease Outcome: Progressing   Problem: Elimination: Goal: Will not experience complications related to bowel motility Outcome: Progressing Goal: Will not experience complications related to urinary retention Outcome: Progressing   Problem: Pain Managment: Goal: General experience of comfort will improve and/or be controlled Outcome: Progressing   Problem: Safety: Goal: Ability to remain free from injury will improve Outcome: Progressing   Problem: Skin Integrity: Goal: Risk for impaired skin integrity will decrease Outcome: Progressing   Problem: Activity: Goal: Ability to tolerate increased activity will improve Outcome: Progressing   Problem: Clinical Measurements: Goal: Ability to maintain a body temperature in the normal range will improve Outcome: Progressing   Problem: Respiratory: Goal: Ability  to maintain adequate ventilation will improve Outcome: Progressing Goal: Ability to maintain a clear airway will improve Outcome: Progressing

## 2024-12-15 NOTE — Care Management Important Message (Signed)
 Important Message  Patient Details  Name: Darius Norris MRN: [id number] Date of Birth: Sep 12, 1951   Important Message Given:  Yes - Medicare IM     Darius Norris 12/15/2024, 1:55 PM

## 2024-12-16 DIAGNOSIS — J189 Pneumonia, unspecified organism: Secondary | ICD-10-CM | POA: Diagnosis not present

## 2024-12-16 LAB — CBC
HCT: 39.6 % (ref 39.0–52.0)
Hemoglobin: 12.7 g/dL — ABNORMAL LOW (ref 13.0–17.0)
MCH: 29.7 pg (ref 26.0–34.0)
MCHC: 32.1 g/dL (ref 30.0–36.0)
MCV: 92.5 fL (ref 80.0–100.0)
Platelets: 166 K/uL (ref 150–400)
RBC: 4.28 MIL/uL (ref 4.22–5.81)
RDW: 15.9 % — ABNORMAL HIGH (ref 11.5–15.5)
WBC: 9.7 K/uL (ref 4.0–10.5)
nRBC: 0 % (ref 0.0–0.2)

## 2024-12-16 LAB — BASIC METABOLIC PANEL WITH GFR
Anion gap: 8 (ref 5–15)
BUN: 33 mg/dL — ABNORMAL HIGH (ref 8–23)
CO2: 29 mmol/L (ref 22–32)
Calcium: 8.9 mg/dL (ref 8.9–10.3)
Chloride: 102 mmol/L (ref 98–111)
Creatinine, Ser: 1.04 mg/dL (ref 0.61–1.24)
GFR, Estimated: >60 mL/min
Glucose, Bld: 121 mg/dL — ABNORMAL HIGH (ref 70–99)
Potassium: 3.7 mmol/L (ref 3.5–5.1)
Sodium: 139 mmol/L (ref 135–145)

## 2024-12-16 LAB — LACTIC ACID, PLASMA: Lactic Acid, Venous: 0.9 mmol/L (ref 0.5–1.9)

## 2024-12-16 MED ORDER — DILTIAZEM HCL-DEXTROSE 125-5 MG/125ML-% IV SOLN (PREMIX): 5.0000 mg/h | INTRAVENOUS | Status: DC

## 2024-12-16 MED ORDER — AMIODARONE HCL IN DEXTROSE 360-4.14 MG/200ML-% IV SOLN
30.0000 mg/h | INTRAVENOUS | Status: DC
  Administered 2024-12-16 – 2024-12-18 (×5): 30 mg/h via INTRAVENOUS
  Filled 2024-12-16 (×5): qty 200

## 2024-12-16 MED ORDER — METOPROLOL SUCCINATE ER 50 MG PO TB24
75.0000 mg | ORAL_TABLET | Freq: Two times a day (BID) | ORAL | Status: DC
  Administered 2024-12-16 – 2024-12-17 (×2): 75 mg via ORAL
  Filled 2024-12-16 (×2): qty 1

## 2024-12-16 MED ORDER — AMIODARONE HCL IN DEXTROSE 360-4.14 MG/200ML-% IV SOLN
60.0000 mg/h | INTRAVENOUS | Status: AC
  Administered 2024-12-16 (×2): 60 mg/h via INTRAVENOUS
  Filled 2024-12-16 (×2): qty 200

## 2024-12-16 MED ORDER — ALPRAZOLAM 0.5 MG PO TABS
0.5000 mg | ORAL_TABLET | Freq: Two times a day (BID) | ORAL | Status: DC | PRN
  Administered 2024-12-16 – 2024-12-18 (×4): 0.5 mg via ORAL
  Filled 2024-12-16 (×5): qty 1

## 2024-12-16 MED ORDER — AMIODARONE IV BOLUS ONLY 150 MG/100ML: 150.0000 mg | Freq: Once | INTRAVENOUS | Status: DC

## 2024-12-16 MED ORDER — AMIODARONE LOAD VIA INFUSION
150.0000 mg | Freq: Once | INTRAVENOUS | Status: AC
  Administered 2024-12-16: 150 mg via INTRAVENOUS
  Filled 2024-12-16: qty 83.34

## 2024-12-16 MED ORDER — DILTIAZEM HCL 25 MG/5ML IV SOLN
10.0000 mg | Freq: Once | INTRAVENOUS | Status: AC
  Administered 2024-12-16: 10 mg via INTRAVENOUS
  Filled 2024-12-16: qty 5

## 2024-12-16 MED ORDER — POTASSIUM CHLORIDE CRYS ER 20 MEQ PO TBCR
20.0000 meq | EXTENDED_RELEASE_TABLET | Freq: Once | ORAL | Status: AC
  Administered 2024-12-16: 20 meq via ORAL
  Filled 2024-12-16: qty 1

## 2024-12-16 MED ORDER — DILTIAZEM LOAD VIA INFUSION: 15.0000 mg | Freq: Once | INTRAVENOUS | Status: DC

## 2024-12-16 NOTE — Consult Note (Signed)
 " [facility] CLINIC CARDIOLOGY CONSULT NOTE       Patient ID: Darius Norris MRN: [id number] DOB/AGE: May 28, 1951 73 y.o.  Admit date: 12/12/2024 Referring Physician Dr. Laneta [facility]  Primary Cardiologist Dr. Ammon Reason for Consultation AF RVR  HPI: Darius Norris is a 73 y.o. male  with a past medical history of CAD (s/p NSTEMI 2001, heart cath 2008 with 40% mid Lcx, 100% OM2), chronic HFrecEF, paroxysmal AF on Pradaxa , COPD on chronic oxygen 3L, hypertension, hyperlipidemia  who presented to the ED on 12/12/2024 for cough and SOB. Admitted for treated of COPD exacerbation. On 12/16/2024 developed atrial fibrillation RVR. Cardiology was consulted for further evaluation.   Patient initially presented for evaluation of SOB, cough. Admitted for treated of COPD exacerbation. Today developed AF RVR. Labs today notable for creatinine 0.14, potassium 3.7, hemoglobin 12.7, WBC 9.7. BNP 1055. EKG in the ED sinus tach rate 113 bpm, no repeat EKG for review from today. AF RVR was initially treated with IV diltiazem  injection without improvement and thus was started on IV amiodarone .   At the time my evaluation this afternoon, patient is sitting upright on side of hospital bed with wife at bedside.  We discussed his symptoms in further detail.  States he initially came for evaluation of shortness of breath and has been treated for COPD exacerbation.  Earlier today he had onset of palpitations and chest tightness, was found to be in atrial fibrillation RVR on telemetry.  He had mild improvement in his rates then started on IV amiodarone , states that overall he is feeling somewhat better with improvement in his palpitations and chest tightness.  Review of systems complete and found to be negative unless listed above    Past Medical History:  Diagnosis Date   A-fib (HCC)    CAD (coronary artery disease)    Colitis    COPD (chronic obstructive  pulmonary disease) (HCC)    Diabetes mellitus without complication (HCC)    Emphysema lung (HCC)    GERD (gastroesophageal reflux disease)    Hypertension    MI (myocardial infarction) (HCC) 1999    Past Surgical History:  Procedure Laterality Date   ABDOMINAL HERNIA REPAIR Bilateral    CHOLECYSTECTOMY     COLONOSCOPY WITH PROPOFOL  N/A 02/01/2018   Procedure: COLONOSCOPY WITH PROPOFOL ;  Surgeon: Therisa Bi, MD;  Location: [facility] ENDOSCOPY;  Service: Gastroenterology;  Laterality: N/A;   KNEE ARTHROSCOPY Bilateral    LEFT HEART CATH AND CORONARY ANGIOGRAPHY N/A 01/03/2023   Procedure: LEFT HEART CATH AND CORONARY ANGIOGRAPHY and possible pci and stent;  Surgeon: Ammon Blunt, MD;  Location: [facility] INVASIVE CV LAB;  Service: Cardiovascular;  Laterality: N/A;    Medications Prior to Admission  Medication Sig Dispense Refill Last Dose/Taking   acetaminophen  (TYLENOL ) 325 MG tablet Take 975 mg by mouth 3 (three) times daily.   12/11/2024   alendronate (FOSAMAX) 70 MG tablet Take 70 mg by mouth once a week.   Past Week   atorvastatin  (LIPITOR ) 80 MG tablet Take 0.5 tablets (40 mg total) by mouth at bedtime. Half of an 80   12/11/2024   Cholecalciferol 50 MCG (2000 UT) TABS Take 50 mcg by mouth 2 (two) times daily with a meal.   12/11/2024   dabigatran  (PRADAXA ) 150 MG CAPS capsule Take 150 mg by mouth 2 (two) times daily.   12/11/2024 Evening   diclofenac  Sodium (VOLTAREN ) 1 % GEL Apply 4 g topically 4 (four) times daily.  Taking   empagliflozin  (JARDIANCE ) 25 MG TABS tablet Take 12.5 mg by mouth daily. Take 0.5 tablet (12.5 mg) by mouth once daily   12/11/2024   ferrous sulfate 325 (65 FE) MG EC tablet Take 325 mg by mouth daily with breakfast.   12/11/2024   fluticasone  (FLONASE ) 50 MCG/ACT nasal spray Place 2 sprays into both nostrils daily as needed.   Unknown   folic acid  (FOLVITE ) 1 MG tablet Take 1 mg by mouth daily. Hold on Sunday when taking Methotrexate    12/11/2024   furosemide   (LASIX ) 40 MG tablet Take 0.5 tablets (20 mg total) by mouth daily. Take 1 tablet (20 mg total) by mouth TWICE daily (total daily dose 40 mg) as needed for up to 3 days for increased leg swelling, shortness of breath, weight gain 5+ lbs over 1-2 days. Seek medical care if these symptoms are not improving with increased dose.   12/11/2024   gabapentin  (NEURONTIN ) 300 MG capsule Take 600 mg by mouth 3 (three) times daily.   12/11/2024   guaifenesin  (HUMIBID E) 400 MG TABS tablet Take 400 mg by mouth every 4 (four) hours as needed.   Unknown   hydrocortisone  2.5 % lotion Apply 1 application  topically 2 (two) times daily.   Taking   isosorbide  mononitrate (IMDUR ) 60 MG 24 hr tablet Take 60 mg by mouth daily.   12/11/2024   lidocaine  (LIDODERM ) 5 % Place 1 patch onto the skin daily as needed. Remove & Discard patch within 12 hours or as directed by MD   Unknown   Melatonin 3 MG CAPS Take 2 capsules by mouth at bedtime.   12/11/2024   metoprolol  (TOPROL -XL) 200 MG 24 hr tablet Take 100 mg by mouth daily.   Taking   naloxone (NARCAN) nasal spray 4 mg/0.1 mL Place 1 spray into the nose once.   Unknown   nitroGLYCERIN  (NITROGLYN) 2 % ointment Apply 1 inch topically as needed for chest pain.   Taking As Needed   omeprazole (PRILOSEC) 20 MG capsule Take 20 mg by mouth daily.   12/11/2024   polyethylene glycol (MIRALAX  / GLYCOLAX ) 17 g packet Take 17 g by mouth daily as needed for mild constipation.   Unknown   potassium chloride  SA (KLOR-CON  M) 20 MEQ tablet Take 20 mEq by mouth daily as needed (With Furosemide ).   Unknown   senna (SENOKOT) 8.6 MG TABS tablet Take 1 tablet by mouth daily as needed for mild constipation.   Unknown   simethicone  (MYLICON) 80 MG chewable tablet Chew 80 mg by mouth 4 (four) times daily as needed for flatulence.   Unknown   sodium chloride  (OCEAN) 0.65 % nasal spray Place 2 sprays into the nose every 6 (six) hours as needed for congestion.   Unknown   Tiotropium Bromide-Olodaterol  (STIOLTO RESPIMAT) 2.5-2.5 MCG/ACT AERS Inhale 2 each into the lungs 2 (two) times daily.   12/11/2024   triamcinolone cream (KENALOG) 0.1 % Apply 1 Application topically 2 (two) times daily.   Taking   methotrexate  (RHEUMATREX) 2.5 MG tablet Take 3 tablets (7.5 mg total) by mouth once a week AND 2 tablets (5 mg total) once a week. Caution:Chemotherapy. Protect from light..      oxyCODONE  (OXY IR/ROXICODONE ) 5 MG immediate release tablet Take 1 tablet (5 mg total) by mouth every 4 (four) hours as needed for moderate pain (pain score 4-6) or severe pain (pain score 7-10). (Patient not taking: Reported on 12/12/2024) 10 tablet 0 Not Taking  Social History   Socioeconomic History   Marital status: Married    Spouse name: Not on file   Number of children: Not on file   Years of education: Not on file   Highest education level: Not on file  Occupational History   Not on file  Tobacco Use   Smoking status: Former    Current packs/day: 0.00    Average packs/day: 1 pack/day for 30.0 years (30.0 ttl pk-yrs)    Types: Cigarettes    Start date: 12/19/1971    Quit date: 12/18/2001    Years since quitting: 23.0   Smokeless tobacco: Never  Vaping Use   Vaping status: Never Used  Substance and Sexual Activity   Alcohol use: No   Drug use: No   Sexual activity: Never  Other Topics Concern   Not on file  Social History Narrative   Not on file   Social Drivers of Health   Tobacco Use: Medium Risk (12/12/2024)   Patient History    Smoking Tobacco Use: Former    Smokeless Tobacco Use: Never    Passive Exposure: Not on Actuary Strain: Not on file  Food Insecurity: No Food Insecurity (12/13/2024)   Epic    Worried About Programme Researcher, Broadcasting/film/video in the Last Year: Never true    Ran Out of Food in the Last Year: Never true  Transportation Needs: No Transportation Needs (12/13/2024)   Epic    Lack of Transportation (Medical): No    Lack of Transportation (Non-Medical): No  Physical  Activity: Not on file  Stress: Not on file  Social Connections: Moderately Isolated (12/13/2024)   Social Connection and Isolation Panel    Frequency of Communication with Friends and Family: More than three times a week    Frequency of Social Gatherings with Friends and Family: Once a week    Attends Religious Services: Never    Database Administrator or Organizations: No    Attends Banker Meetings: Never    Marital Status: Married  Catering Manager Violence: Not At Risk (12/13/2024)   Epic    Fear of Current or Ex-Partner: No    Emotionally Abused: No    Physically Abused: No    Sexually Abused: No  Depression (PHQ2-9): Low Risk (10/10/2023)   Depression (PHQ2-9)    PHQ-2 Score: 4  Alcohol Screen: Not on file  Housing: Low Risk (12/13/2024)   Epic    Unable to Pay for Housing in the Last Year: No    Number of Times Moved in the Last Year: 0    Homeless in the Last Year: No  Utilities: Not At Risk (12/13/2024)   Epic    Threatened with loss of utilities: No  Health Literacy: Not on file    Family History  Problem Relation Age of Onset   CAD Father    Stroke Father    CAD Sister    Stroke Brother      Vitals:   12/16/24 1124 12/16/24 1131 12/16/24 1150 12/16/24 1200  BP:  119/71 (!) 124/98 (!) 132/92  Pulse: (!) 128 (!) 130  (!) 149  Resp: 16 (!) 27  (!) 29  Temp:      TempSrc:      SpO2: 100% 99%  99%  Weight:      Height:        PHYSICAL EXAM General: Chronically ill appearing male, well nourished, in no acute distress. HEENT: Normocephalic and atraumatic. Neck: No  JVD.  Lungs: Normal respiratory effort on 4-5L [place]. Wheezing noted.  Heart: Irregularly irregular, elevated rate. Normal S1 and S2 without gallops or murmurs.  Abdomen: Non-distended appearing.  Msk: Normal strength and tone for age. Extremities: Warm and well perfused. No clubbing, cyanosis. No edema.  Neuro: Alert and oriented X 3. Psych: Answers questions appropriately.    Labs: Basic Metabolic Panel: Recent Labs    12/14/24 0358 12/16/24 0442  NA 137 139  K 5.1 3.7  CL 100 102  CO2 27 29  GLUCOSE 137* 121*  BUN 29* 33*  CREATININE 1.17 1.04  CALCIUM  8.8* 8.9   Liver Function Tests: No results for input(s): AST, ALT, ALKPHOS, BILITOT, PROT, ALBUMIN in the last 72 hours. No results for input(s): LIPASE, AMYLASE in the last 72 hours. CBC: Recent Labs    12/14/24 0358 12/16/24 0442  WBC 15.3* 9.7  HGB 12.9* 12.7*  HCT 40.6 39.6  MCV 92.7 92.5  PLT 184 166   Cardiac Enzymes: No results for input(s): CKTOTAL, CKMB, CKMBINDEX, TROPONINIHS in the last 72 hours. BNP: No results for input(s): BNP in the last 72 hours. D-Dimer: No results for input(s): DDIMER in the last 72 hours. Hemoglobin A1C: No results for input(s): HGBA1C in the last 72 hours. Fasting Lipid Panel: No results for input(s): CHOL, HDL, LDLCALC, TRIG, CHOLHDL, LDLDIRECT in the last 72 hours. Thyroid  Function Tests: No results for input(s): TSH, T4TOTAL, T3FREE, THYROIDAB in the last 72 hours.  Invalid input(s): FREET3 Anemia Panel: No results for input(s): VITAMINB12, FOLATE, FERRITIN, TIBC, IRON, RETICCTPCT in the last 72 hours.   Radiology: CT CHEST WO CONTRAST Result Date: 12/12/2024 EXAM: CT CHEST WITHOUT CONTRAST 12/12/2024 09:06:11 AM TECHNIQUE: CT of the chest was performed without the administration of intravenous contrast. Multiplanar reformatted images are provided for review. Automated exposure control, iterative reconstruction, and/or weight based adjustment of the mA/kV was utilized to reduce the radiation dose to as low as reasonably achievable. COMPARISON: Chest CT 03/13/last year. CLINICAL HISTORY: 73 year old male with pneumonia, suspected complication, and new lung base opacity on radiographs today. FINDINGS: MEDIASTINUM: Heart size is normal. No pericardial effusion. Advanced calcified aortic  and left coronary artery atherosclerosis. New retained secretions at the carina and in the mainstem bronchi. LYMPH NODES: No mediastinal, hilar or axillary lymphadenopathy. LUNGS AND PLEURA: [place] trachea contour redemonstrated (series 4 image 34). New retained secretions at the carina and in the mainstem bronchi. Emphysema redemonstrated. Consolidation throughout the right middle lobe, specifically the lateral segment. Patchy adjacent right upper and lower lobe opacity. Small or trace layering right pleural effusion. No acute left lung base opacity. New lung herniation now in the left posterolateral intercostal space of ribs 9 and 10 (series 4 image 133). No pneumothorax. BONES: No acute osseous abnormality. UPPER ABDOMEN: Limited images of the upper abdomen demonstrate negative visible non-contrast upper abdominal viscera. IMPRESSION: 1. Right middle lobe consolidated Pneumonia, with early infection in the adjacent lobes. Small volume retained secretions in the central airways. Small or trace layering right pleural effusion. 2. Underlying Emphysema and COPD. New since last year left lower lung herniation in the left posterolateral intercostal space of ribs 9 and 10. Electronically signed by: Helayne Hurst MD 12/12/2024 09:16 AM EST RP Workstation: [id number]   DG Chest Port 1 View Result Date: 12/12/2024 CLINICAL DATA:  Questionable sepsis. EXAM: PORTABLE CHEST 1 VIEW COMPARISON:  01/12/2024 FINDINGS: Interval development of patchy and confluent airspace disease in the right lower lung consistent with pneumonia. Similar patchy opacity at  the left base is likely chronic. Bullous changes with underlying emphysema noted. Cardiopericardial silhouette is at upper limits of normal for size. No acute bony abnormality. Telemetry leads overlie the chest. IMPRESSION: Interval development of patchy and confluent airspace disease in the right lower lung consistent with pneumonia. Electronically Signed   By: Camellia Candle M.D.   On: 12/12/2024 06:31    ECHO 12/2022: 1. Left ventricular ejection fraction, by estimation, is 55 to 60%. The left ventricle has normal function. The left ventricle has no regional wall motion abnormalities. The left ventricular internal cavity size was moderately to severely dilated. Left ventricular diastolic parameters are consistent with Grade I diastolic dysfunction (impaired relaxation).   2. Right ventricular systolic function is low normal. The right ventricular size is moderately enlarged. Mildly increased right ventricular wall thickness.   3. Right atrial size was mildly dilated.   4. The mitral valve is normal in structure. Trivial mitral valve regurgitation.   5. The aortic valve is normal in structure. Aortic valve regurgitation is  not visualized.   TELEMETRY (personally reviewed): AF RVR rate 130s  EKG (personally reviewed): Sinus tachycardia rate 113 bpm, baseline artifact  Data reviewed by me 12/16/2024: last 24h vitals tele labs imaging I/O ED provider note, admission H&P, hospitalist progress note  Principal Problem:   Pneumonia Active Problems:   CAP (community acquired pneumonia)    ASSESSMENT AND PLAN:  Darius Norris is a 73 y.o. male  with a past medical history of CAD (s/p NSTEMI 2001, heart cath 2008 with 40% mid Lcx, 100% OM2), chronic HFrecEF, paroxysmal AF on Pradaxa , COPD on chronic oxygen 3L, hypertension, hyperlipidemia  who presented to the ED on 12/12/2024 for cough and SOB. Admitted for treated of COPD exacerbation. On 12/16/2024 developed atrial fibrillation RVR. Cardiology was consulted for further evaluation.   # Atrial fibrillation RVR # Paroxysmal atrial fibrillation # COPD exacerbation # Coronary artery disease # Chronic HFrecEF Patient initially presented with SOB and cough, admitted for treated of COPD exacerbation. On 12/16/24, developed AF RVR with associated palpitations, chest tightness.  -Continue IV amiodarone  bolus and  infusion. Would not be a good candidate for TEE/DCCV given acute COPD exacerbation. -Increase metoprolol  succinate to 75 mg twice daily.  -Continue pradaxa  150 mg twice daily.  -Continue atorvastatin  40 mg daily.    This patient's plan of care was discussed and created with Dr. Wilburn and he is in agreement.  Signed: Danita Bloch, PA-C  12/16/2024, 1:13 PM [facility] Cardiology      "

## 2024-12-16 NOTE — Progress Notes (Signed)
 " PROGRESS NOTE    Darius Norris   FMW:[id number] DOB: 11-25-1951  DOA: 12/12/2024 Date of Service: 12/16/2024 which is hospital day 4  PCP: [facility] course / significant events:   HPI: Darius Norris is a 73 y.o. 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 Symptoms started 4 weeks ago when patient started to develop wheezing productive cough with occasional coughing up yellowish phlegm, last few days patient also started develop right-sided chest pain, worsening with cough and deep breath, he has no fever or chills.  Last night, more shortness of breath and could not sleep, and family called EMS.  EMS arrived and found patient more hypoxic and gave patient Solu-Medrol  and albuterol  treatment.  12/26: to ED. Afebrile, tachycardia heart rate in the 110s, tachypneic blood pressure 124/81 O2 saturation 99% on BiPAP 15/840%.  Chest x-ray showed right-sided lower field infiltrates.  CT chest without contrast showed right middle lobe infiltrates and consolidation.  Blood work showed WBC 32 with left shift, WBC morphology unremarkable,, lactic acid 2.5, proBNP 1000. Patient was given IV fentanyl , cefepime  and LR 1000 mL x 1.Admitted to hospitalist for sepsis d/t RML lobar CAP and COPD exacerbation.  12/27: NRB --> BiPAP --> now on HFNC 6L/min,  12/28: down to 5L, lung sounds are improved.  12/29: down to 4L.  Anticipating for discharge tomorrow if remains at his baseline. 12/30: Unfortunately A-fib RVR today.  Cardiology consulted.     Consultants:  Cardiology  Procedures/Surgeries: none      ASSESSMENT & PLAN:   Sepsis d/t RML CAP - severe sepsis w/ hypoxia, elevation lactic acid  COPD exacerbation  Acute on chornic hypoxic respiratory failure  Other Ddx, patient seem to have low risk for  aspiration.  He does have a newly found hiatal hernia being followed by GI.  CT chest showed no signs of empyema or lung abscess formation. He was recently dx w/ lung hernia on L and has hurt to breathe for some time, probably reduced inspiration has contributed to PNA risk  Caution w/ IV fluids given hx CHF, will repeat fluids only if low BP  CAP coverage with ceftriaxone  doxycycline   Aformoterol + umeclidinium scheduled  as needed levalbuterol  to avoid worsening tachycardia. IV solumedrol  --> prednisone   No ICS w/ pneumonia incentive spirometry. O2 supplementation as needed, wean as able  repeat CT chest in 6 to 8 weeks to document resolution of the pneumonia. Hold methotrexate  for now    PAF Went into A-fib RVR 12/16/2024 Cardiology consult Did amiodarone  drip Increase metoprolol  Pradaxa   CAD Pradaxa  (no ASA), statin, metoprolol  Low BP limits other stnadard meds       Class 1 obesity based on BMI: Body mass index is 32.34 kg/m.SABRA Significantly low or high BMI is associated with higher medical risk.  Underweight - under 18  overweight - 25 to 29 obese - 30 or more Class 1 obesity: BMI of 30.0 to 34 Class 2 obesity: BMI of 35.0 to 39 Class 3 obesity: BMI of 40.0 to 49 Super Morbid Obesity: BMI 50-59 Super-super Morbid Obesity: BMI 60+ Healthy nutrition and physical activity advised as adjunct to other disease management and risk reduction treatments    DVT prophylaxis: pradaxa  IV fluids: no continuous IV fluids  Nutrition: cardiac Central lines / other devices: none  Code Status: FULL CODE ACP documentation reviewed: none on file in [facility]  [facility] needs: Home health, patient has declined skilled nursing facility for rehab Medical barriers to dispo: A-fib/RVR.  Expected medical readiness for discharge pending heart rate control, cardiology clearance.             Subjective / Brief ROS:  Called to patient's room with concern for tachycardia, A-fib/RVR per  RN. Patient reports some chest pain with rapid heart rate, this later resolved. Patient is concerned that we have not been giving him his medications, I reviewed the [facility] with him. Denies CP/SOB at rest    Family Communication: support person at bedside on rounds     Objective Findings:  Vitals:   12/16/24 1200 12/16/24 1325 12/16/24 1603 12/16/24 1700  BP: (!) 132/92 (!) 121/91 135/82   Pulse: (!) 149 (!) 133 (!) 138 94  Resp: (!) 29 20 (!) 22 20  Temp:   (!) 97.5 F (36.4 C)   TempSrc:   Oral   SpO2: 99%  100% 97%  Weight:      Height:        Intake/Output Summary (Last 24 hours) at 12/16/2024 1844 Last data filed at 12/16/2024 1718 Gross per 24 hour  Intake 240 ml  Output 1800 ml  Net -1560 ml   Filed Weights   12/12/24 0545  Weight: 80.2 kg    Examination:  Physical Exam Constitutional:      General: He is not in acute distress. Cardiovascular:     Rate and Rhythm: Tachycardia present. Rhythm irregular.     Heart sounds: Murmur heard.  Pulmonary:     Effort: Pulmonary effort is normal.     Breath sounds: Examination of the right-lower field reveals decreased breath sounds. Examination of the left-lower field reveals decreased breath sounds. Decreased breath sounds and wheezing (scattered) present. No rhonchi.  Musculoskeletal:     Right lower leg: No edema.     Left lower leg: No edema.  Skin:    General: Skin is warm and dry.  Neurological:     Mental Status: He is alert and oriented to person, place, and time.  Psychiatric:        Mood and Affect: Mood normal.        Behavior: Behavior normal.          Scheduled Medications:   arformoterol   15 mcg Nebulization BID   And   umeclidinium bromide   1 puff Inhalation Daily   atorvastatin   40 mg Oral QHS   dabigatran   150 mg Oral BID   doxycycline   100 mg Oral Q12H   levalbuterol   0.63 mg Nebulization BID   melatonin  5 mg Oral QHS   metoprolol   75 mg Oral BID   pantoprazole   40 mg Oral Daily    predniSONE   40 mg Oral Q breakfast    Continuous Infusions:  amiodarone  30 mg/hr (12/16/24 1803)   cefTRIAXone  (ROCEPHIN )  IV 2 g (12/16/24 0858)    PRN Medications:  ALPRAZolam , fluticasone , gabapentin , guaiFENesin -dextromethorphan , labetalol , oxyCODONE , polyethylene glycol, simethicone , traZODone   Antimicrobials from admission:  Anti-infectives (From admission, onward)    Start     Dose/Rate Route Frequency Ordered Stop   12/13/24 1000  cefTRIAXone  (ROCEPHIN ) 2 g in sodium chloride  0.9 % 100 mL IVPB        2 g 200 mL/hr over 30 Minutes Intravenous Every 24 hours 12/12/24 0841 12/18/24 0959   12/12/24 1000  doxycycline  (VIBRA -TABS) tablet 100 mg  100 mg Oral Every 12 hours 12/12/24 0841 12/17/24 0959   12/12/24 0700  vancomycin  (VANCOCIN ) IVPB 1000 mg/200 mL premix  Status:  Discontinued        1,000 mg 200 mL/hr over 60 Minutes Intravenous  Once 12/12/24 0652 12/12/24 0654   12/12/24 0700  ceFEPIme  (MAXIPIME ) 2 g in sodium chloride  0.9 % 100 mL IVPB        2 g 200 mL/hr over 30 Minutes Intravenous  Once 12/12/24 0652 12/12/24 0745   12/12/24 0700  vancomycin  (VANCOREADY) IVPB 2000 mg/400 mL        2,000 mg 200 mL/hr over 120 Minutes Intravenous  Once 12/12/24 0654 12/12/24 0944           Data Reviewed:  I have personally reviewed the following...  CBC: Recent Labs  Lab 12/12/24 0545 12/13/24 0405 12/14/24 0358 12/16/24 0442  WBC 32.1* 34.2* 15.3* 9.7  NEUTROABS 26.3*  --   --   --   HGB 15.5 13.5 12.9* 12.7*  HCT 49.7 43.7 40.6 39.6  MCV 94.5 95.6 92.7 92.5  PLT 270 195 184 166   Basic Metabolic Panel: Recent Labs  Lab 12/12/24 0545 12/13/24 0405 12/14/24 0358 12/16/24 0442  NA 137 135 137 139  K 4.4 4.2 5.1 3.7  CL 100 95* 100 102  CO2 25 26 27 29   GLUCOSE 153* 112* 137* 121*  BUN 18 25* 29* 33*  CREATININE 1.20 1.33* 1.17 1.04  CALCIUM  8.8* 8.7* 8.8* 8.9   GFR: Estimated Creatinine Clearance: 58 mL/min (by C-G formula based on SCr of  1.04 mg/dL). Liver Function Tests: Recent Labs  Lab 12/12/24 0545  AST 21  ALT 14  ALKPHOS 117  BILITOT 0.9  PROT 6.9  ALBUMIN 4.3   Recent Labs  Lab 12/12/24 0545  LIPASE 26   No results for input(s): AMMONIA in the last 168 hours. Coagulation Profile: No results for input(s): INR, PROTIME in the last 168 hours. Cardiac Enzymes: No results for input(s): CKTOTAL, CKMB, CKMBINDEX, TROPONINI in the last 168 hours. BNP (last 3 results) Recent Labs    12/12/24 0545  PROBNP 1,055.0*   HbA1C: No results for input(s): HGBA1C in the last 72 hours. CBG: No results for input(s): GLUCAP in the last 168 hours. Lipid Profile: No results for input(s): CHOL, HDL, LDLCALC, TRIG, CHOLHDL, LDLDIRECT in the last 72 hours. Thyroid  Function Tests: No results for input(s): TSH, T4TOTAL, FREET4, T3FREE, THYROIDAB in the last 72 hours. Anemia Panel: No results for input(s): VITAMINB12, FOLATE, FERRITIN, TIBC, IRON, RETICCTPCT in the last 72 hours. Most Recent Urinalysis On File:     Component Value Date/Time   COLORURINE YELLOW (A) 08/17/2022 0254   APPEARANCEUR CLEAR (A) 08/17/2022 0254   APPEARANCEUR Clear 02/04/2013 1430   LABSPEC 1.011 08/17/2022 0254   LABSPEC 1.004 02/04/2013 1430   PHURINE 5.0 08/17/2022 0254   GLUCOSEU NEGATIVE 08/17/2022 0254   GLUCOSEU Negative 02/04/2013 1430   HGBUR NEGATIVE 08/17/2022 0254   BILIRUBINUR NEGATIVE 08/17/2022 0254   BILIRUBINUR Negative 02/04/2013 1430   KETONESUR NEGATIVE 08/17/2022 0254   PROTEINUR NEGATIVE 08/17/2022 0254   NITRITE NEGATIVE 08/17/2022 0254   LEUKOCYTESUR NEGATIVE 08/17/2022 0254   LEUKOCYTESUR Negative 02/04/2013 1430   Sepsis Labs: @LABRCNTIP (procalcitonin:4,lacticidven:4) Microbiology: Recent Results (from the past 240 hours)  Resp panel by RT-PCR (RSV, Flu A&B, Covid) Anterior Nasal Swab     Status: None   Collection Time: 12/12/24  5:40 AM   Specimen:  Anterior Nasal Swab  Result  Value Ref Range Status   SARS Coronavirus 2 by RT PCR NEGATIVE NEGATIVE Final    Comment: (NOTE) SARS-CoV-2 target nucleic acids are NOT DETECTED.  The SARS-CoV-2 RNA is generally detectable in upper respiratory specimens during the acute phase of infection. The lowest concentration of SARS-CoV-2 viral copies this assay can detect is 138 copies/mL. A negative result does not preclude SARS-Cov-2 infection and should not be used as the sole basis for treatment or other patient management decisions. A negative result may occur with  improper specimen collection/handling, submission of specimen other than nasopharyngeal swab, presence of viral mutation(s) within the areas targeted by this assay, and inadequate number of viral copies(<138 copies/mL). A negative result must be combined with clinical observations, patient history, and epidemiological information. The expected result is Negative.  Fact Sheet for Patients:  bloggercourse.com  Fact Sheet for Healthcare Providers:  seriousbroker.it  This test is no t yet approved or cleared by the [place]  FDA and  has been authorized for detection and/or diagnosis of SARS-CoV-2 by FDA under an Emergency Use Authorization (EUA). This EUA will remain  in effect (meaning this test can be used) for the duration of the COVID-19 declaration under Section 564(b)(1) of the Act, 21 U.S.C.section 360bbb-3(b)(1), unless the authorization is terminated  or revoked sooner.       Influenza A by PCR NEGATIVE NEGATIVE Final   Influenza B by PCR NEGATIVE NEGATIVE Final    Comment: (NOTE) The Xpert Xpress SARS-CoV-2/FLU/RSV plus assay is intended as an aid in the diagnosis of influenza from Nasopharyngeal swab specimens and should not be used as a sole basis for treatment. Nasal washings and aspirates are unacceptable for Xpert Xpress SARS-CoV-2/FLU/RSV testing.  Fact  Sheet for Patients: bloggercourse.com  Fact Sheet for Healthcare Providers: seriousbroker.it  This test is not yet approved or cleared by the [place]  FDA and has been authorized for detection and/or diagnosis of SARS-CoV-2 by FDA under an Emergency Use Authorization (EUA). This EUA will remain in effect (meaning this test can be used) for the duration of the COVID-19 declaration under Section 564(b)(1) of the Act, 21 U.S.C. section 360bbb-3(b)(1), unless the authorization is terminated or revoked.     Resp Syncytial Virus by PCR NEGATIVE NEGATIVE Final    Comment: (NOTE) Fact Sheet for Patients: bloggercourse.com  Fact Sheet for Healthcare Providers: seriousbroker.it  This test is not yet approved or cleared by the [place]  FDA and has been authorized for detection and/or diagnosis of SARS-CoV-2 by FDA under an Emergency Use Authorization (EUA). This EUA will remain in effect (meaning this test can be used) for the duration of the COVID-19 declaration under Section 564(b)(1) of the Act, 21 U.S.C. section 360bbb-3(b)(1), unless the authorization is terminated or revoked.  Performed at [facility], [address] [place], [place] [address]   Blood culture (routine single)     Status: None (Preliminary result)   Collection Time: 12/12/24  5:56 AM   Specimen: BLOOD  Result Value Ref Range Status   Specimen Description BLOOD BLOOD LEFT ARM  Final   Special Requests   Final    BOTTLES DRAWN AEROBIC AND ANAEROBIC Blood Culture adequate volume   Culture   Final    NO GROWTH 4 DAYS Performed at [facility], [address], [place], [place] [address]    Report Status PENDING  Incomplete  Culture, blood (single)     Status: None (Preliminary result)   Collection Time: 12/12/24  7:10 AM  Specimen: BLOOD  Result Value Ref Range Status   Specimen  Description BLOOD BLOOD RIGHT ARM  Final   Special Requests   Final    BOTTLES DRAWN AEROBIC AND ANAEROBIC Blood Culture results may not be optimal due to an inadequate volume of blood received in culture bottles   Culture   Final    NO GROWTH 4 DAYS Performed at [facility], [address] [place], [place] [address]    Report Status PENDING  Incomplete  Expectorated Sputum Assessment w Gram Stain, Rflx to Resp Cult     Status: None   Collection Time: 12/12/24  9:59 PM   Specimen: Expectorated Sputum  Result Value Ref Range Status   Specimen Description EXPECTORATED SPUTUM  Final   Special Requests NONE  Final   Sputum evaluation   Final    Sputum specimen not acceptable for testing.  Please recollect.   CALLED TO ALEX ASIDO @ 0110 ON 12/13/2024 BY CAF Performed at [facility], [address] [place], [place] [address]    Report Status 12/13/2024 FINAL  Final      Radiology Studies last 3 days: No results found.         Elaynah Virginia, DO [facility] 12/16/2024, 6:44 PM    Dictation software may have been used to generate the above note. Typos may occur and escape review in typed/dictated notes. Please contact Dr Marsa directly for clarity if needed.  Staff may message me via secure chat in Epic  but this may not receive an immediate response,  please page me for urgent matters!  If 7PM-7AM, please contact night coverage [email or web address]       "

## 2024-12-16 NOTE — Plan of Care (Signed)
" °  Problem: Education: Goal: Knowledge of General Education information will improve Description: Including pain rating scale, medication(s)/side effects and non-pharmacologic comfort measures Outcome: Progressing   Problem: Health Behavior/Discharge Planning: Goal: Ability to manage health-related needs will improve Outcome: Not Progressing   Problem: Clinical Measurements: Goal: Respiratory complications will improve Outcome: Not Progressing Goal: Cardiovascular complication will be avoided Outcome: Adequate for Discharge   Problem: Activity: Goal: Risk for activity intolerance will decrease Outcome: Not Progressing   "

## 2024-12-16 NOTE — Progress Notes (Signed)
 Made, Alexander, Natalie. DO aware of pt being in Afib with RVR HR 144-160

## 2024-12-16 NOTE — Plan of Care (Signed)

## 2024-12-17 DIAGNOSIS — J189 Pneumonia, unspecified organism: Secondary | ICD-10-CM | POA: Diagnosis not present

## 2024-12-17 LAB — BASIC METABOLIC PANEL WITH GFR
Anion gap: 7 (ref 5–15)
BUN: 29 mg/dL — ABNORMAL HIGH (ref 8–23)
CO2: 32 mmol/L (ref 22–32)
Calcium: 9.4 mg/dL (ref 8.9–10.3)
Chloride: 103 mmol/L (ref 98–111)
Creatinine, Ser: 1.16 mg/dL (ref 0.61–1.24)
GFR, Estimated: >60 mL/min
Glucose, Bld: 128 mg/dL — ABNORMAL HIGH (ref 70–99)
Potassium: 4.2 mmol/L (ref 3.5–5.1)
Sodium: 142 mmol/L (ref 135–145)

## 2024-12-17 LAB — CBC
HCT: 42 % (ref 39.0–52.0)
Hemoglobin: 13.3 g/dL (ref 13.0–17.0)
MCH: 29.8 pg (ref 26.0–34.0)
MCHC: 31.7 g/dL (ref 30.0–36.0)
MCV: 94.2 fL (ref 80.0–100.0)
Platelets: 183 K/uL (ref 150–400)
RBC: 4.46 MIL/uL (ref 4.22–5.81)
RDW: 15.9 % — ABNORMAL HIGH (ref 11.5–15.5)
WBC: 10.1 K/uL (ref 4.0–10.5)
nRBC: 0 % (ref 0.0–0.2)

## 2024-12-17 LAB — RENAL FUNCTION PANEL
Albumin: 3.4 g/dL — ABNORMAL LOW (ref 3.5–5.0)
Anion gap: 8 (ref 5–15)
BUN: 29 mg/dL — ABNORMAL HIGH (ref 8–23)
CO2: 31 mmol/L (ref 22–32)
Calcium: 9.5 mg/dL (ref 8.9–10.3)
Chloride: 102 mmol/L (ref 98–111)
Creatinine, Ser: 1.16 mg/dL (ref 0.61–1.24)
GFR, Estimated: >60 mL/min
Glucose, Bld: 126 mg/dL — ABNORMAL HIGH (ref 70–99)
Phosphorus: 2.6 mg/dL (ref 2.5–4.6)
Potassium: 4.2 mmol/L (ref 3.5–5.1)
Sodium: 141 mmol/L (ref 135–145)

## 2024-12-17 LAB — EXPECTORATED SPUTUM ASSESSMENT W GRAM STAIN, RFLX TO RESP C

## 2024-12-17 LAB — CULTURE, BLOOD (SINGLE)
Culture: NO GROWTH
Culture: NO GROWTH
Special Requests: ADEQUATE

## 2024-12-17 LAB — MAGNESIUM: Magnesium: 2.3 mg/dL (ref 1.7–2.4)

## 2024-12-17 MED ORDER — METOPROLOL SUCCINATE ER 100 MG PO TB24
100.0000 mg | ORAL_TABLET | Freq: Two times a day (BID) | ORAL | Status: DC
  Administered 2024-12-17 – 2024-12-18 (×2): 100 mg via ORAL
  Filled 2024-12-17 (×2): qty 1

## 2024-12-17 NOTE — Progress Notes (Signed)
 Physical Therapy Treatment Patient Details Name: Darius Norris MRN: [id number] DOB: [birth year] Today's Date: 12/17/2024   History of Present Illness Pt is a 73 y.o. 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  MD assessment includes: CAP, sepsis, acute hypoxic respiratory failure, and acute COPD exacerbation.    PT Comments  Pt was pleasant and motivated to participate during the session and put forth good effort throughout. Pt taken through graded activity this session starting with seated therex and ending with amb with SpO2 WNL throughout on 2L and HR in the 110's to low 120s at rest and increasing to a high of the low 130s with ambulation. Frequent short therapeutic rest breaks taken during the session along with cues for proper breathing pattern during therex.  Pt was generally steady with amb with a RW but did require one instance of min A to prevent posterior LOB while taking a backward step.  Pt will benefit from continued PT services upon discharge to safely address deficits listed in patient problem list for decreased caregiver assistance and eventual return to PLOF.       If plan is discharge home, recommend the following: A lot of help with walking and/or transfers;A little help with bathing/dressing/bathroom;Assistance with cooking/housework;Assist for transportation;Help with stairs or ramp for entrance   Can travel by private vehicle     No  Equipment Recommendations  Other (comment) (BSC if discharges home)    Recommendations for Other Services       Precautions / Restrictions Precautions Precautions: Fall Restrictions Weight Bearing Restrictions Per Provider Order: No     Mobility  Bed Mobility               General bed mobility comments: NT, pt found sitting at EOB pre-post session     Transfers Overall transfer level: Needs assistance Equipment used: Rolling walker (2 wheels) Transfers: Sit to/from Stand Sit to Stand: Contact guard assist, Min assist, From elevated surface           General transfer comment: Min A to come to standing from bed in low position, CGA from elevated EOB with extra time and effort and min cues for hand placement    Ambulation/Gait Ambulation/Gait assistance: Contact guard assist, Min assist Gait Distance (Feet): 12 Feet x 1, 5 Feet x 1 Assistive device: Rolling walker (2 wheels) Gait Pattern/deviations: Step-to pattern, Trunk flexed, Decreased step length - right, Decreased step length - left Gait velocity: decreased     General Gait Details: Slow cadence with short B step length and generally steady with no overt LOB except for one instance of min A needed for stability while taking a backwards step   Stairs             Wheelchair Mobility     Tilt Bed    Modified Rankin (Stroke Patients Only)       Balance Overall balance assessment: Needs assistance   Sitting balance-Leahy Scale: Normal     Standing balance support: Bilateral upper extremity supported, During functional activity, Reliant on assistive device for balance Standing balance-Leahy Scale: Fair Standing balance comment: Once instance of min A for stability while taking a backwards step                            Communication Communication  Communication: No apparent difficulties  Cognition Arousal: Alert Behavior During Therapy: WFL for tasks assessed/performed   PT - Cognitive impairments: No apparent impairments                         Following commands: Intact      Cueing Cueing Techniques: Verbal cues, Tactile cues  Exercises Total Joint Exercises Ankle Circles/Pumps: AROM, Strengthening, Both, 15 reps Towel Squeeze: Strengthening, Both, 10 reps Long Arc Quad: Strengthening, Both, 10 reps Knee Flexion:  Strengthening, Both, 10 reps    General Comments        Pertinent Vitals/Pain Pain Assessment Pain Assessment: 0-10 Pain Score: 3  Pain Location: bilateral ribs Pain Descriptors / Indicators: Sore Pain Intervention(s): Premedicated before session, Monitored during session    Home Living                          Prior Function            PT Goals (current goals can now be found in the care plan section) Progress towards PT goals: Progressing toward goals    Frequency    Min 3X/week      PT Plan      Co-evaluation              AM-PAC PT 6 Clicks Mobility   Outcome Measure  Help needed turning from your back to your side while in a flat bed without using bedrails?: A Little Help needed moving from lying on your back to sitting on the side of a flat bed without using bedrails?: A Little Help needed moving to and from a bed to a chair (including a wheelchair)?: A Little Help needed standing up from a chair using your arms (e.g., wheelchair or bedside chair)?: A Little Help needed to walk in hospital room?: A Little Help needed climbing 3-5 steps with a railing? : A Lot 6 Click Score: 17    End of Session Equipment Utilized During Treatment: Gait belt;Oxygen Activity Tolerance: Patient tolerated treatment well Patient left: in bed;with family/visitor present;with call bell/phone within reach Nurse Communication: Mobility status PT Visit Diagnosis: Unsteadiness on feet (R26.81);Difficulty in walking, not elsewhere classified (R26.2);Muscle weakness (generalized) (M62.81);Pain     Time: 8359-8295 PT Time Calculation (min) (ACUTE ONLY): 24 min  Charges:    $Gait Training: 8-22 mins $Therapeutic Exercise: 8-22 mins PT General Charges $$ ACUTE PT VISIT: 1 Visit                     D. Scott Banesa Tristan PT, DPT 12/17/2024, 5:20 PM

## 2024-12-17 NOTE — Progress Notes (Addendum)
 " PROGRESS NOTE    Darius Norris   FMW:[id number] DOB: Jul 19, 1951  DOA: 12/12/2024 Date of Service: 12/17/2024 which is hospital day 5  PCP: [facility]   Darius Norris is a 73 y.o. 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 Symptoms started 4 weeks ago when patient started to develop wheezing productive cough with occasional coughing up yellowish phlegm, last few days patient also started develop right-sided chest pain, worsening with cough and deep breath, he has no fever or chills.  Last night, more shortness of breath and could not sleep, and family called EMS.  EMS arrived and found patient more hypoxic and gave patient Solu-Medrol  and albuterol  treatment.   12/26: to ED. Afebrile, tachycardia heart rate in the 110s, tachypneic blood pressure 124/81 O2 saturation 99% on BiPAP 15/840%.  Chest x-ray showed right-sided lower field infiltrates.  CT chest without contrast showed right middle lobe infiltrates and consolidation.  Blood work showed WBC 32 with left shift, WBC morphology unremarkable,, lactic acid 2.5, proBNP 1000. Patient was given IV fentanyl , cefepime  and LR 1000 mL x 1.Admitted to hospitalist for sepsis d/t RML lobar CAP and COPD exacerbation.  12/27: NRB --> BiPAP --> now on HFNC 6L/min,  12/28: down to 5L, lung sounds are improved.  12/29: down to 4L.  Anticipating for discharge tomorrow if remains at his baseline. 12/30: Unfortunately A-fib RVR today.  Cardiology consulted.     Subjective / Brief ROS:   Sitting on edge of bed, no dyspnea. No chest pain   Sepsis d/t RML CAP - severe sepsis w/ hypoxia, elevation lactic acid  COPD exacerbation  Acute on chornic hypoxic respiratory failure  Other Ddx, patient seem to have low risk for aspiration.  He does have a newly found hiatal hernia being  followed by GI.  CT chest showed no signs of empyema or lung abscess formation. He was recently dx w/ lung hernia on L and has hurt to breathe for some time, probably reduced inspiration has contributed to PNA risk  Caution w/ IV fluids given hx CHF, will repeat fluids only if low BP  CAP coverage with ceftriaxone  doxycycline , has now completed a 5 day course Aformoterol + umeclidinium scheduled  as needed levalbuterol  to avoid worsening tachycardia. IV solumedrol  --> prednisone   No ICS w/ pneumonia incentive spirometry. O2 supplementation as needed, now weaned to home 3 liters Check urine antigens and rvp, also sputum for culture   PAF Went into A-fib RVR 12/16/2024 Cardiology consult On amiodarone  drip Increase metoprolol  Pradaxa    CAD Pradaxa  (no ASA), statin, metoprolol  Low BP limits other stnadard meds       Debility PT advising snf but patient declines, opts instead for home health     Class 1 obesity based on BMI: noted   DVT prophylaxis: pradaxa  IV fluids: no continuous IV fluids  Nutrition: cardiac Central lines / other devices: none   Code Status: FULL CODE ACP documentation reviewed: none on file in [facility]   [facility] needs: Home health, patient has declined skilled nursing facility for rehab Medical barriers to dispo: A-fib/RVR on amio gtt  Family Communication: wife at bedside 12/31    Objective Findings:  Vitals:   12/17/24 0900 12/17/24 1001 12/17/24 1255 12/17/24 1300  BP:      Pulse: 70 (!) 117  (!) 104  Resp: 14 (!) 21  20  Temp:   98.1 F (36.7 C)   TempSrc:   Oral   SpO2: 100% 99%  100%  Weight:      Height:        Intake/Output Summary (Last 24 hours) at 12/17/2024 1601 Last data filed at 12/17/2024 1000 Gross per 24 hour  Intake 565 ml  Output 1350 ml  Net -785 ml   Filed Weights   12/12/24 0545  Weight: 80.2 kg    Examination:   NAD Tachycardic, irreg irreg Lungs clear Abdomen obese, soft, non-tender Ext wam, trace  edema AAO x3, moving all 4 symmetrically Calm     Scheduled Medications:   arformoterol   15 mcg Nebulization BID   And   umeclidinium bromide   1 puff Inhalation Daily   atorvastatin   40 mg Oral QHS   dabigatran   150 mg Oral BID   levalbuterol   0.63 mg Nebulization BID   melatonin  5 mg Oral QHS   metoprolol   100 mg Oral BID   pantoprazole   40 mg Oral Daily   predniSONE   40 mg Oral Q breakfast    Continuous Infusions:  amiodarone  30 mg/hr (12/17/24 0904)    PRN Medications:  ALPRAZolam , fluticasone , gabapentin , guaiFENesin -dextromethorphan , labetalol , oxyCODONE , polyethylene glycol, simethicone , traZODone   Antimicrobials from admission:  Anti-infectives (From admission, onward)    Start     Dose/Rate Route Frequency Ordered Stop   12/13/24 1000  cefTRIAXone  (ROCEPHIN ) 2 g in sodium chloride  0.9 % 100 mL IVPB        2 g 200 mL/hr over 30 Minutes Intravenous Every 24 hours 12/12/24 0841 12/17/24 1027   12/12/24 1000  doxycycline  (VIBRA -TABS) tablet 100 mg        100 mg Oral Every 12 hours 12/12/24 0841 12/16/24 2053   12/12/24 0700  vancomycin  (VANCOCIN ) IVPB 1000 mg/200 mL premix  Status:  Discontinued        1,000 mg 200 mL/hr over 60 Minutes Intravenous  Once 12/12/24 0652 12/12/24 0654   12/12/24 0700  ceFEPIme  (MAXIPIME ) 2 g in sodium chloride  0.9 % 100 mL IVPB        2 g 200 mL/hr over 30 Minutes Intravenous  Once 12/12/24 0652 12/12/24 0745   12/12/24 0700  vancomycin  (VANCOREADY) IVPB 2000 mg/400 mL        2,000 mg 200 mL/hr over 120 Minutes Intravenous  Once 12/12/24 0654 12/12/24 0944           Data Reviewed:  I have personally reviewed the following...  CBC: Recent Labs  Lab 12/12/24 0545 12/13/24 0405 12/14/24 0358 12/16/24 0442 12/17/24 0423  WBC 32.1* 34.2* 15.3* 9.7 10.1  NEUTROABS 26.3*  --   --   --   --   HGB 15.5 13.5 12.9* 12.7* 13.3  HCT 49.7 43.7 40.6 39.6 42.0  MCV 94.5 95.6 92.7 92.5 94.2  PLT 270 195 184 166 183   Basic  Metabolic Panel: Recent Labs  Lab 12/13/24 0405 12/14/24 0358 12/16/24 0442 12/17/24 0423 12/17/24 0425  NA 135 137 139 142 141  K 4.2 5.1 3.7 4.2 4.2  CL 95* 100 102 103 102  CO2 26 27 29  32 31  GLUCOSE 112* 137* 121* 128* 126*  BUN 25* 29* 33* 29* 29*  CREATININE 1.33* 1.17 1.04 1.16 1.16  CALCIUM  8.7* 8.8* 8.9 9.4 9.5  MG  --   --   --  2.3  --   PHOS  --   --   --   --  2.6   GFR: Estimated Creatinine Clearance: 52 mL/min (by C-G formula based on SCr of 1.16 mg/dL). Liver Function Tests: Recent Labs  Lab 12/12/24 0545 12/17/24 0425  AST 21  --   ALT 14  --   ALKPHOS 117  --   BILITOT 0.9  --   PROT 6.9  --   ALBUMIN 4.3 3.4*   Recent Labs  Lab 12/12/24 0545  LIPASE 26   No results for input(s): AMMONIA in the last 168 hours. Coagulation Profile: No results for input(s): INR, PROTIME in the last 168 hours. Cardiac Enzymes: No results for input(s): CKTOTAL, CKMB, CKMBINDEX, TROPONINI in the last 168 hours. BNP (last 3 results) Recent Labs    12/12/24 0545  PROBNP 1,055.0*   HbA1C: No results for input(s): HGBA1C in the last 72 hours. CBG: No results for input(s): GLUCAP in the last 168 hours. Lipid Profile: No results for input(s): CHOL, HDL, LDLCALC, TRIG, CHOLHDL, LDLDIRECT in the last 72 hours. Thyroid  Function Tests: No results for input(s): TSH, T4TOTAL, FREET4, T3FREE, THYROIDAB in the last 72 hours. Anemia Panel: No results for input(s): VITAMINB12, FOLATE, FERRITIN, TIBC, IRON, RETICCTPCT in the last 72 hours. Most Recent Urinalysis On File:     Component Value Date/Time   COLORURINE YELLOW (A) 08/17/2022 0254   APPEARANCEUR CLEAR (A) 08/17/2022 0254   APPEARANCEUR Clear 02/04/2013 1430   LABSPEC 1.011 08/17/2022 0254   LABSPEC 1.004 02/04/2013 1430   PHURINE 5.0 08/17/2022 0254   GLUCOSEU NEGATIVE 08/17/2022 0254   GLUCOSEU Negative 02/04/2013 1430   HGBUR NEGATIVE 08/17/2022 0254    BILIRUBINUR NEGATIVE 08/17/2022 0254   BILIRUBINUR Negative 02/04/2013 1430   KETONESUR NEGATIVE 08/17/2022 0254   PROTEINUR NEGATIVE 08/17/2022 0254   NITRITE NEGATIVE 08/17/2022 0254   LEUKOCYTESUR NEGATIVE 08/17/2022 0254   LEUKOCYTESUR Negative 02/04/2013 1430   Sepsis Labs: @LABRCNTIP (procalcitonin:4,lacticidven:4) Microbiology: Recent Results (from the past 240 hours)  Resp panel by RT-PCR (RSV, Flu A&B, Covid) Anterior Nasal Swab     Status: None   Collection Time: 12/12/24  5:40 AM   Specimen: Anterior Nasal Swab  Result Value Ref Range Status   SARS Coronavirus 2 by RT PCR NEGATIVE NEGATIVE Final    Comment: (NOTE) SARS-CoV-2 target nucleic acids are NOT DETECTED.  The SARS-CoV-2 RNA is generally detectable in upper respiratory specimens during the acute phase of infection. The lowest concentration of SARS-CoV-2 viral copies this assay can detect is 138 copies/mL. A negative result does not preclude SARS-Cov-2 infection and should not be used as the sole basis for treatment or other patient management decisions. A negative result may occur with  improper specimen collection/handling, submission of specimen other than nasopharyngeal swab, presence of viral mutation(s) within the areas targeted by this assay, and inadequate number of viral copies(<138 copies/mL). A negative result must be combined with clinical observations, patient history, and epidemiological information. The expected result is Negative.  Fact Sheet for Patients:  bloggercourse.com  Fact Sheet for Healthcare Providers:  seriousbroker.it  This test is no t yet approved or cleared by the [place]  FDA and  has been authorized for detection and/or diagnosis of SARS-CoV-2 by FDA under an Emergency Use Authorization (EUA). This EUA will remain  in effect (meaning this test can be used) for the duration of the COVID-19 declaration under Section  564(b)(1) of the Act, 21 U.S.C.section 360bbb-3(b)(1), unless the authorization is terminated  or revoked sooner.       Influenza A by PCR NEGATIVE NEGATIVE Final  Influenza B by PCR NEGATIVE NEGATIVE Final    Comment: (NOTE) The Xpert Xpress SARS-CoV-2/FLU/RSV plus assay is intended as an aid in the diagnosis of influenza from Nasopharyngeal swab specimens and should not be used as a sole basis for treatment. Nasal washings and aspirates are unacceptable for Xpert Xpress SARS-CoV-2/FLU/RSV testing.  Fact Sheet for Patients: bloggercourse.com  Fact Sheet for Healthcare Providers: seriousbroker.it  This test is not yet approved or cleared by the [place]  FDA and has been authorized for detection and/or diagnosis of SARS-CoV-2 by FDA under an Emergency Use Authorization (EUA). This EUA will remain in effect (meaning this test can be used) for the duration of the COVID-19 declaration under Section 564(b)(1) of the Act, 21 U.S.C. section 360bbb-3(b)(1), unless the authorization is terminated or revoked.     Resp Syncytial Virus by PCR NEGATIVE NEGATIVE Final    Comment: (NOTE) Fact Sheet for Patients: bloggercourse.com  Fact Sheet for Healthcare Providers: seriousbroker.it  This test is not yet approved or cleared by the [place]  FDA and has been authorized for detection and/or diagnosis of SARS-CoV-2 by FDA under an Emergency Use Authorization (EUA). This EUA will remain in effect (meaning this test can be used) for the duration of the COVID-19 declaration under Section 564(b)(1) of the Act, 21 U.S.C. section 360bbb-3(b)(1), unless the authorization is terminated or revoked.  Performed at [facility], [address] [place], [place] [address]   Blood culture (routine single)     Status: None   Collection Time: 12/12/24  5:56 AM   Specimen: BLOOD   Result Value Ref Range Status   Specimen Description BLOOD BLOOD LEFT ARM  Final   Special Requests   Final    BOTTLES DRAWN AEROBIC AND ANAEROBIC Blood Culture adequate volume   Culture   Final    NO GROWTH 5 DAYS Performed at [facility], [address] [place], [place] [address]    Report Status 12/17/2024 FINAL  Final  Culture, blood (single)     Status: None   Collection Time: 12/12/24  7:10 AM   Specimen: BLOOD  Result Value Ref Range Status   Specimen Description BLOOD BLOOD RIGHT ARM  Final   Special Requests   Final    BOTTLES DRAWN AEROBIC AND ANAEROBIC Blood Culture results may not be optimal due to an inadequate volume of blood received in culture bottles   Culture   Final    NO GROWTH 5 DAYS Performed at [facility], [address], [place], [place] [address]    Report Status 12/17/2024 FINAL  Final  Expectorated Sputum Assessment w Gram Stain, Rflx to Resp Cult     Status: None   Collection Time: 12/12/24  9:59 PM   Specimen: Expectorated Sputum  Result Value Ref Range Status   Specimen Description EXPECTORATED SPUTUM  Final   Special Requests NONE  Final   Sputum evaluation   Final    Sputum specimen not acceptable for testing.  Please recollect.   CALLED TO ALEX ASIDO @ 0110 ON 12/13/2024 BY CAF Performed at [facility] [address] [place], [place] [address]    Report Status 12/13/2024 FINAL  Final      Radiology Studies last 3 days: No results found.         Devaughn KATHEE Ban, MD [facility] 12/17/2024, 4:01 PM     Staff may message me via secure chat in Epic  but this may not receive an immediate response,  please page me for urgent matters!  If 7PM-7AM, please contact night coverage [email or web address]       "

## 2024-12-17 NOTE — Plan of Care (Signed)

## 2024-12-17 NOTE — Progress Notes (Signed)
 " [facility] CLINIC CARDIOLOGY PROGRESS NOTE       Patient ID: Darius Norris MRN: [id number] DOB/AGE: [birth year] 73 y.o.  Admit date: 12/12/2024 Referring Physician Dr. Laneta [facility]  Primary Cardiologist Dr. Ammon Reason for Consultation AF RVR  HPI: Darius Norris is a 73 y.o. male  with a past medical history of CAD (s/p NSTEMI 2001, heart cath 2008 with 40% mid Lcx, 100% OM2), chronic HFrecEF, paroxysmal AF on Pradaxa , COPD on chronic oxygen 3L, hypertension, hyperlipidemia  who presented to the ED on 12/12/2024 for cough and SOB. Admitted for treated of COPD exacerbation. On 12/16/2024 developed atrial fibrillation RVR. Cardiology was consulted for further evaluation.   Interval history: -Patient seen and examined this AM, sitting upright on side of hospital bed with wife at bedside.  -Reports feeling about the same today, SOB stable O2 weaned to home requirement. Reports that cough was worse this morning.  -Denies any CP or palpitations. HR elevated on tele ~110s. BP stable.   Review of systems complete and found to be negative unless listed above    Past Medical History:  Diagnosis Date   A-fib (HCC)    CAD (coronary artery disease)    Colitis    COPD (chronic obstructive pulmonary disease) (HCC)    Diabetes mellitus without complication (HCC)    Emphysema lung (HCC)    GERD (gastroesophageal reflux disease)    Hypertension    MI (myocardial infarction) (HCC) 1999    Past Surgical History:  Procedure Laterality Date   ABDOMINAL HERNIA REPAIR Bilateral    CHOLECYSTECTOMY     COLONOSCOPY WITH PROPOFOL  N/A 02/01/2018   Procedure: COLONOSCOPY WITH PROPOFOL ;  Surgeon: Therisa Bi, MD;  Location: [facility] ENDOSCOPY;  Service: Gastroenterology;  Laterality: N/A;   KNEE ARTHROSCOPY Bilateral    LEFT HEART CATH AND CORONARY ANGIOGRAPHY N/A 01/03/2023   Procedure: LEFT HEART CATH AND CORONARY ANGIOGRAPHY and possible pci and stent;   Surgeon: Ammon Blunt, MD;  Location: [facility] INVASIVE CV LAB;  Service: Cardiovascular;  Laterality: N/A;    Medications Prior to Admission  Medication Sig Dispense Refill Last Dose/Taking   acetaminophen  (TYLENOL ) 325 MG tablet Take 975 mg by mouth 3 (three) times daily.   12/11/2024   alendronate (FOSAMAX) 70 MG tablet Take 70 mg by mouth once a week.   Past Week   atorvastatin  (LIPITOR ) 80 MG tablet Take 0.5 tablets (40 mg total) by mouth at bedtime. Half of an 80   12/11/2024   Cholecalciferol 50 MCG (2000 UT) TABS Take 50 mcg by mouth 2 (two) times daily with a meal.   12/11/2024   dabigatran  (PRADAXA ) 150 MG CAPS capsule Take 150 mg by mouth 2 (two) times daily.   12/11/2024 Evening   diclofenac  Sodium (VOLTAREN ) 1 % GEL Apply 4 g topically 4 (four) times daily.   Taking   empagliflozin  (JARDIANCE ) 25 MG TABS tablet Take 12.5 mg by mouth daily. Take 0.5 tablet (12.5 mg) by mouth once daily   12/11/2024   ferrous sulfate 325 (65 FE) MG EC tablet Take 325 mg by mouth daily with breakfast.   12/11/2024   fluticasone  (FLONASE ) 50 MCG/ACT nasal spray Place 2 sprays into both nostrils daily as needed.   Unknown   folic acid  (FOLVITE ) 1 MG tablet Take 1 mg by mouth daily. Hold on Sunday when taking Methotrexate    12/11/2024   furosemide  (LASIX ) 40 MG tablet Take 0.5 tablets (20 mg total) by mouth daily. Take  1 tablet (20 mg total) by mouth TWICE daily (total daily dose 40 mg) as needed for up to 3 days for increased leg swelling, shortness of breath, weight gain 5+ lbs over 1-2 days. Seek medical care if these symptoms are not improving with increased dose.   12/11/2024   gabapentin  (NEURONTIN ) 300 MG capsule Take 600 mg by mouth 3 (three) times daily.   12/11/2024   guaifenesin  (HUMIBID E) 400 MG TABS tablet Take 400 mg by mouth every 4 (four) hours as needed.   Unknown   hydrocortisone  2.5 % lotion Apply 1 application  topically 2 (two) times daily.   Taking   isosorbide  mononitrate (IMDUR )  60 MG 24 hr tablet Take 60 mg by mouth daily.   12/11/2024   lidocaine  (LIDODERM ) 5 % Place 1 patch onto the skin daily as needed. Remove & Discard patch within 12 hours or as directed by MD   Unknown   Melatonin 3 MG CAPS Take 2 capsules by mouth at bedtime.   12/11/2024   metoprolol  (TOPROL -XL) 200 MG 24 hr tablet Take 100 mg by mouth daily.   Taking   naloxone (NARCAN) nasal spray 4 mg/0.1 mL Place 1 spray into the nose once.   Unknown   nitroGLYCERIN  (NITROGLYN) 2 % ointment Apply 1 inch topically as needed for chest pain.   Taking As Needed   omeprazole (PRILOSEC) 20 MG capsule Take 20 mg by mouth daily.   12/11/2024   polyethylene glycol (MIRALAX  / GLYCOLAX ) 17 g packet Take 17 g by mouth daily as needed for mild constipation.   Unknown   potassium chloride  SA (KLOR-CON  M) 20 MEQ tablet Take 20 mEq by mouth daily as needed (With Furosemide ).   Unknown   senna (SENOKOT) 8.6 MG TABS tablet Take 1 tablet by mouth daily as needed for mild constipation.   Unknown   simethicone  (MYLICON) 80 MG chewable tablet Chew 80 mg by mouth 4 (four) times daily as needed for flatulence.   Unknown   sodium chloride  (OCEAN) 0.65 % nasal spray Place 2 sprays into the nose every 6 (six) hours as needed for congestion.   Unknown   Tiotropium Bromide-Olodaterol (STIOLTO RESPIMAT) 2.5-2.5 MCG/ACT AERS Inhale 2 each into the lungs 2 (two) times daily.   12/11/2024   triamcinolone cream (KENALOG) 0.1 % Apply 1 Application topically 2 (two) times daily.   Taking   methotrexate  (RHEUMATREX) 2.5 MG tablet Take 3 tablets (7.5 mg total) by mouth once a week AND 2 tablets (5 mg total) once a week. Caution:Chemotherapy. Protect from light..      oxyCODONE  (OXY IR/ROXICODONE ) 5 MG immediate release tablet Take 1 tablet (5 mg total) by mouth every 4 (four) hours as needed for moderate pain (pain score 4-6) or severe pain (pain score 7-10). (Patient not taking: Reported on 12/12/2024) 10 tablet 0 Not Taking   Social History    Socioeconomic History   Marital status: Married    Spouse name: Not on file   Number of children: Not on file   Years of education: Not on file   Highest education level: Not on file  Occupational History   Not on file  Tobacco Use   Smoking status: Former    Current packs/day: 0.00    Average packs/day: 1 pack/day for 30.0 years (30.0 ttl pk-yrs)    Types: Cigarettes    Start date: 12/19/1971    Quit date: 12/18/2001    Years since quitting: 23.0   Smokeless tobacco: Never  Vaping Use   Vaping status: Never Used  Substance and Sexual Activity   Alcohol use: No   Drug use: No   Sexual activity: Never  Other Topics Concern   Not on file  Social History Narrative   Not on file   Social Drivers of Health   Tobacco Use: Medium Risk (12/12/2024)   Patient History    Smoking Tobacco Use: Former    Smokeless Tobacco Use: Never    Passive Exposure: Not on Actuary Strain: Not on file  Food Insecurity: No Food Insecurity (12/13/2024)   Epic    Worried About Programme Researcher, Broadcasting/film/video in the Last Year: Never true    Ran Out of Food in the Last Year: Never true  Transportation Needs: No Transportation Needs (12/13/2024)   Epic    Lack of Transportation (Medical): No    Lack of Transportation (Non-Medical): No  Physical Activity: Not on file  Stress: Not on file  Social Connections: Moderately Isolated (12/13/2024)   Social Connection and Isolation Panel    Frequency of Communication with Friends and Family: More than three times a week    Frequency of Social Gatherings with Friends and Family: Once a week    Attends Religious Services: Never    Database Administrator or Organizations: No    Attends Banker Meetings: Never    Marital Status: Married  Catering Manager Violence: Not At Risk (12/13/2024)   Epic    Fear of Current or Ex-Partner: No    Emotionally Abused: No    Physically Abused: No    Sexually Abused: No  Depression (PHQ2-9): Low Risk  (10/10/2023)   Depression (PHQ2-9)    PHQ-2 Score: 4  Alcohol Screen: Not on file  Housing: Low Risk (12/13/2024)   Epic    Unable to Pay for Housing in the Last Year: No    Number of Times Moved in the Last Year: 0    Homeless in the Last Year: No  Utilities: Not At Risk (12/13/2024)   Epic    Threatened with loss of utilities: No  Health Literacy: Not on file    Family History  Problem Relation Age of Onset   CAD Father    Stroke Father    CAD Sister    Stroke Brother      Vitals:   12/17/24 0742 12/17/24 0854 12/17/24 0900 12/17/24 1001  BP:  112/73    Pulse:  65 70 (!) 117  Resp:  18 14 (!) 21  Temp:  97.8 F (36.6 C)    TempSrc:  Oral    SpO2: 99% 98% 100% 99%  Weight:      Height:        PHYSICAL EXAM General: Chronically ill appearing male, well nourished, in no acute distress. HEENT: Normocephalic and atraumatic. Neck: No JVD.  Lungs: Normal respiratory effort on 3L [place]. Wheezing noted.  Heart: Irregularly irregular, elevated rate. Normal S1 and S2 without gallops or murmurs.  Abdomen: Non-distended appearing.  Msk: Normal strength and tone for age. Extremities: Warm and well perfused. No clubbing, cyanosis. No edema.  Neuro: Alert and oriented X 3. Psych: Answers questions appropriately.   Labs: Basic Metabolic Panel: Recent Labs    12/17/24 0423 12/17/24 0425  NA 142 141  K 4.2 4.2  CL 103 102  CO2 32 31  GLUCOSE 128* 126*  BUN 29* 29*  CREATININE 1.16 1.16  CALCIUM  9.4 9.5  MG 2.3  --  PHOS  --  2.6   Liver Function Tests: Recent Labs    12/17/24 0425  ALBUMIN 3.4*   No results for input(s): LIPASE, AMYLASE in the last 72 hours. CBC: Recent Labs    12/16/24 0442 12/17/24 0423  WBC 9.7 10.1  HGB 12.7* 13.3  HCT 39.6 42.0  MCV 92.5 94.2  PLT 166 183   Cardiac Enzymes: No results for input(s): CKTOTAL, CKMB, CKMBINDEX, TROPONINIHS in the last 72 hours. BNP: No results for input(s): BNP in the last 72  hours. D-Dimer: No results for input(s): DDIMER in the last 72 hours. Hemoglobin A1C: No results for input(s): HGBA1C in the last 72 hours. Fasting Lipid Panel: No results for input(s): CHOL, HDL, LDLCALC, TRIG, CHOLHDL, LDLDIRECT in the last 72 hours. Thyroid  Function Tests: No results for input(s): TSH, T4TOTAL, T3FREE, THYROIDAB in the last 72 hours.  Invalid input(s): FREET3 Anemia Panel: No results for input(s): VITAMINB12, FOLATE, FERRITIN, TIBC, IRON, RETICCTPCT in the last 72 hours.   Radiology: CT CHEST WO CONTRAST Result Date: 12/12/2024 EXAM: CT CHEST WITHOUT CONTRAST 12/12/2024 09:06:11 AM TECHNIQUE: CT of the chest was performed without the administration of intravenous contrast. Multiplanar reformatted images are provided for review. Automated exposure control, iterative reconstruction, and/or weight based adjustment of the mA/kV was utilized to reduce the radiation dose to as low as reasonably achievable. COMPARISON: Chest CT 03/13/last year. CLINICAL HISTORY: 73 year old male with pneumonia, suspected complication, and new lung base opacity on radiographs today. FINDINGS: MEDIASTINUM: Heart size is normal. No pericardial effusion. Advanced calcified aortic and left coronary artery atherosclerosis. New retained secretions at the carina and in the mainstem bronchi. LYMPH NODES: No mediastinal, hilar or axillary lymphadenopathy. LUNGS AND PLEURA: [place] trachea contour redemonstrated (series 4 image 34). New retained secretions at the carina and in the mainstem bronchi. Emphysema redemonstrated. Consolidation throughout the right middle lobe, specifically the lateral segment. Patchy adjacent right upper and lower lobe opacity. Small or trace layering right pleural effusion. No acute left lung base opacity. New lung herniation now in the left posterolateral intercostal space of ribs 9 and 10 (series 4 image 133). No pneumothorax. BONES: No acute  osseous abnormality. UPPER ABDOMEN: Limited images of the upper abdomen demonstrate negative visible non-contrast upper abdominal viscera. IMPRESSION: 1. Right middle lobe consolidated Pneumonia, with early infection in the adjacent lobes. Small volume retained secretions in the central airways. Small or trace layering right pleural effusion. 2. Underlying Emphysema and COPD. New since last year left lower lung herniation in the left posterolateral intercostal space of ribs 9 and 10. Electronically signed by: Helayne Hurst MD 12/12/2024 09:16 AM EST RP Workstation: [id number]   DG Chest Port 1 View Result Date: 12/12/2024 CLINICAL DATA:  Questionable sepsis. EXAM: PORTABLE CHEST 1 VIEW COMPARISON:  01/12/2024 FINDINGS: Interval development of patchy and confluent airspace disease in the right lower lung consistent with pneumonia. Similar patchy opacity at the left base is likely chronic. Bullous changes with underlying emphysema noted. Cardiopericardial silhouette is at upper limits of normal for size. No acute bony abnormality. Telemetry leads overlie the chest. IMPRESSION: Interval development of patchy and confluent airspace disease in the right lower lung consistent with pneumonia. Electronically Signed   By: Camellia Candle M.D.   On: 12/12/2024 06:31    ECHO 12/2022: 1. Left ventricular ejection fraction, by estimation, is 55 to 60%. The left ventricle has normal function. The left ventricle has no regional wall motion abnormalities. The left ventricular internal cavity size was moderately to severely  dilated. Left ventricular diastolic parameters are consistent with Grade I diastolic dysfunction (impaired relaxation).   2. Right ventricular systolic function is low normal. The right ventricular size is moderately enlarged. Mildly increased right ventricular wall thickness.   3. Right atrial size was mildly dilated.   4. The mitral valve is normal in structure. Trivial mitral valve regurgitation.   5.  The aortic valve is normal in structure. Aortic valve regurgitation is  not visualized.   TELEMETRY (personally reviewed): AF RVR rate 130s  EKG (personally reviewed): Sinus tachycardia rate 113 bpm, baseline artifact  Data reviewed by me 12/17/2024: last 24h vitals tele labs imaging I/O ED provider note, admission H&P, hospitalist progress note  Principal Problem:   Pneumonia Active Problems:   CAP (community acquired pneumonia)    ASSESSMENT AND PLAN:  Darius Norris is a 73 y.o. male  with a past medical history of CAD (s/p NSTEMI 2001, heart cath 2008 with 40% mid Lcx, 100% OM2), chronic HFrecEF, paroxysmal AF on Pradaxa , COPD on chronic oxygen 3L, hypertension, hyperlipidemia  who presented to the ED on 12/12/2024 for cough and SOB. Admitted for treated of COPD exacerbation. On 12/16/2024 developed atrial fibrillation RVR. Cardiology was consulted for further evaluation.   # Atrial fibrillation RVR # Paroxysmal atrial fibrillation # COPD exacerbation # Coronary artery disease # Chronic HFrecEF Patient initially presented with SOB and cough, admitted for treated of COPD exacerbation. On 12/16/24, developed AF RVR with associated palpitations, chest tightness.  -Continue IV amiodarone  bolus and infusion. Would not be a good candidate for TEE/DCCV given acute COPD exacerbation. -Increase metoprolol  succinate to 100 mg twice daily.  -Continue pradaxa  150 mg twice daily.  -Continue atorvastatin  40 mg daily.    This patient's plan of care was discussed and created with Dr. Wilburn and he is in agreement.  Signed: Danita Bloch, PA-C  12/17/2024, 11:34 AM [facility]      "

## 2024-12-17 NOTE — Plan of Care (Signed)
   Problem: Education: Goal: Knowledge of General Education information will improve Description: Including pain rating scale, medication(s)/side effects and non-pharmacologic comfort measures Outcome: Progressing   Problem: Health Behavior/Discharge Planning: Goal: Ability to manage health-related needs will improve Outcome: Progressing   Problem: Clinical Measurements: Goal: Will remain free from infection Outcome: Progressing

## 2024-12-17 NOTE — TOC Progression Note (Signed)
 Transition of Care [facility]) - Progression Note    Patient Details  Name: Darius Norris MRN: [id number] Date of Birth: Jan 18, 1951  [facility] CM/SW Contact  Shasta DELENA Daring, RN Phone Number: 12/17/2024, 1:16 PM  Clinical Narrative:     [facility]  Patient verbalized understanding.                     Expected Discharge Plan and Services                                               Social Drivers of Health (SDOH) Interventions SDOH Screenings   Food Insecurity: No Food Insecurity (12/13/2024)  Housing: Low Risk (12/13/2024)  Transportation Needs: No Transportation Needs (12/13/2024)  Utilities: Not At Risk (12/13/2024)  Depression (PHQ2-9): Low Risk (10/10/2023)  Social Connections: Moderately Isolated (12/13/2024)  Tobacco Use: Medium Risk (12/12/2024)    Readmission Risk Interventions    01/14/2024    1:28 PM 08/26/2023   12:35 PM  Readmission Risk Prevention Plan  Transportation Screening Complete Complete  PCP or Specialist Appt within 5-7 Days  Complete  PCP or Specialist Appt within 3-5 Days Complete   Home Care Screening  Complete  Medication Review (RN CM)  Complete  Social Work Consult for Recovery Care Planning/Counseling Complete   Palliative Care Screening Not Applicable   Medication Review Oceanographer) Complete

## 2024-12-18 DIAGNOSIS — J189 Pneumonia, unspecified organism: Secondary | ICD-10-CM | POA: Diagnosis not present

## 2024-12-18 LAB — RESPIRATORY PANEL BY PCR

## 2024-12-18 LAB — BASIC METABOLIC PANEL WITH GFR
Anion gap: 11 (ref 5–15)
BUN: 26 mg/dL — ABNORMAL HIGH (ref 8–23)
CO2: 29 mmol/L (ref 22–32)
Calcium: 9.5 mg/dL (ref 8.9–10.3)
Chloride: 98 mmol/L (ref 98–111)
Creatinine, Ser: 1.07 mg/dL (ref 0.61–1.24)
GFR, Estimated: >60 mL/min
Glucose, Bld: 113 mg/dL — ABNORMAL HIGH (ref 70–99)
Potassium: 3.8 mmol/L (ref 3.5–5.1)
Sodium: 139 mmol/L (ref 135–145)

## 2024-12-18 LAB — CBC
HCT: 45.8 % (ref 39.0–52.0)
Hemoglobin: 14.5 g/dL (ref 13.0–17.0)
MCH: 29.5 pg (ref 26.0–34.0)
MCHC: 31.7 g/dL (ref 30.0–36.0)
MCV: 93.3 fL (ref 80.0–100.0)
Platelets: 212 K/uL (ref 150–400)
RBC: 4.91 MIL/uL (ref 4.22–5.81)
RDW: 16 % — ABNORMAL HIGH (ref 11.5–15.5)
WBC: 11.2 K/uL — ABNORMAL HIGH (ref 4.0–10.5)
nRBC: 0 % (ref 0.0–0.2)

## 2024-12-18 MED ORDER — ACETAMINOPHEN 325 MG PO TABS
650.0000 mg | ORAL_TABLET | Freq: Four times a day (QID) | ORAL | Status: DC | PRN
  Administered 2024-12-18: 650 mg via ORAL
  Filled 2024-12-18: qty 2

## 2024-12-18 MED ORDER — METOPROLOL SUCCINATE ER 200 MG PO TB24: 100.0000 mg | ORAL_TABLET | Freq: Two times a day (BID) | ORAL | 1 refills | Status: AC

## 2024-12-18 NOTE — Discharge Summary (Signed)
 Darius Norris FMW:[id number] DOB: October 07, 1951 DOA: 12/12/2024  PCP: [facility]  Admit date: 12/12/2024 Discharge date: 12/18/2024  Time spent: 35 minutes  Recommendations for Outpatient Follow-up:  Cardiology f/u 2 weeks as scheduled Pcp f/u 1 week     Discharge Diagnoses:  Principal Problem:   Pneumonia Active Problems:   Atrial fibrillation with RVR (HCC)   CAP (community acquired pneumonia)   Discharge Condition: stable  Diet recommendation: heart healthy  Filed Weights   12/12/24 0545  Weight: 80.2 kg    History of present illness:  From admission h and p Darius Norris is a 74 y.o. 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   Symptoms started 4 weeks ago when patient started to develop wheezing productive cough with occasional coughing up yellowish phlegm, last few days patient also started develop right-sided chest pain, worsening with cough and deep breath, he has no fever or chills.  Denies any cough or choking after eating or drinking no swallowing difficulties.  No recent weight loss.  Last night, patient became more shortness of breath and could not sleep, and family called EMS.  EMS arrived and found patient more hypoxic and gave patient Solu-Medrol  and albuterol  treatment.    Hospital Course:   Patient presents with CAP, found to be septic and with acute hypoxic respiratory failure. Treated with 5 day course ceftriaxone /azithromycin . Viral testing negative. Corticosteroids given for concomitant copd exacerbation. Infection resolved, weaned to home 3 liters and stable there. Hospital course complicated by a-fib with rvr, this was treated with IV amiodarone  with cardiology following. Has now converted back to sinus rhythm and is stable for discharge per cardiology, they advise increasing  home metoprolol  to twice daily dosing. Patient has f/u scheduled with them in 2 weeks. PT advised SNF but patient declines this, elects instead to pursue home health with the [facility]. He has all necessary DME.   Procedures: none   Consultations: cardiology  Discharge Exam: Vitals:   12/18/24 0828 12/18/24 1128  BP: 139/69 132/69  Pulse: 68 64  Resp: (!) 23 (!) 22  Temp: 97.8 F (36.6 C) 99.1 F (37.3 C)  SpO2: 99% 100%    General: NAD Cardiovascular: rrr, soft systolic murmur Respiratory: few scattered rhonchi  Discharge Instructions   Discharge Instructions     Diet - low sodium heart healthy   Complete by: As directed    Increase activity slowly   Complete by: As directed       Allergies as of 12/18/2024       Reactions   Zantac [ranitidine Hcl] Shortness Of Breath   Eruption, HIVES, SHORTNESS OF BREATH, Eruption, HIVES, SHORTNESS OF BREATH   Molnupiravir Swelling   Codeine Itching   Other reaction(s): PRURITIS   Erythromycin Hives   Other reaction(s): SWELLING (NON-SPECIFIC), HIVES, SWELLING (NON-SPECIFIC), HIVES   Morphine  Itching   Tiotropium Bromide    Hydrocodone Rash   Other reaction(s): PRURITIS        Medication List     STOP taking these medications    oxyCODONE  5 MG immediate release tablet Commonly known as: Oxy IR/ROXICODONE        TAKE these medications    acetaminophen  325 MG tablet Commonly known as: TYLENOL  Take 975 mg by mouth 3 (three) times daily.   alendronate 70 MG tablet Commonly known as: FOSAMAX Take  70 mg by mouth once a week.   atorvastatin  80 MG tablet Commonly known as: LIPITOR  Take 0.5 tablets (40 mg total) by mouth at bedtime. Half of an 80   Cholecalciferol 50 MCG (2000 UT) Tabs Take 50 mcg by mouth 2 (two) times daily with a meal.   dabigatran  150 MG Caps capsule Commonly known as: PRADAXA  Take 150 mg by mouth 2 (two) times daily.   diclofenac  Sodium 1 % Gel Commonly known as: VOLTAREN  Apply 4 g  topically 4 (four) times daily.   empagliflozin  25 MG Tabs tablet Commonly known as: JARDIANCE  Take 12.5 mg by mouth daily. Take 0.5 tablet (12.5 mg) by mouth once daily   ferrous sulfate 325 (65 FE) MG EC tablet Take 325 mg by mouth daily with breakfast.   fluticasone  50 MCG/ACT nasal spray Commonly known as: FLONASE  Place 2 sprays into both nostrils daily as needed.   folic acid  1 MG tablet Commonly known as: FOLVITE  Take 1 mg by mouth daily. Hold on Sunday when taking Methotrexate    furosemide  40 MG tablet Commonly known as: LASIX  Take 0.5 tablets (20 mg total) by mouth daily. Take 1 tablet (20 mg total) by mouth TWICE daily (total daily dose 40 mg) as needed for up to 3 days for increased leg swelling, shortness of breath, weight gain 5+ lbs over 1-2 days. Seek medical care if these symptoms are not improving with increased dose.   gabapentin  300 MG capsule Commonly known as: NEURONTIN  Take 600 mg by mouth 3 (three) times daily.   guaifenesin  400 MG Tabs tablet Commonly known as: HUMIBID E Take 400 mg by mouth every 4 (four) hours as needed.   hydrocortisone  2.5 % lotion Apply 1 application  topically 2 (two) times daily.   isosorbide  mononitrate 60 MG 24 hr tablet Commonly known as: IMDUR  Take 60 mg by mouth daily.   lidocaine  5 % Commonly known as: LIDODERM  Place 1 patch onto the skin daily as needed. Remove & Discard patch within 12 hours or as directed by MD   Melatonin 3 MG Caps Take 2 capsules by mouth at bedtime.   methotrexate  2.5 MG tablet Commonly known as: RHEUMATREX Take 3 tablets (7.5 mg total) by mouth once a week AND 2 tablets (5 mg total) once a week. Caution:Chemotherapy. Protect from light..   metoprolol  200 MG 24 hr tablet Commonly known as: TOPROL -XL Take 0.5 tablets (100 mg total) by mouth in the morning and at bedtime. What changed: when to take this   naloxone 4 MG/0.1ML Liqd nasal spray kit Commonly known as: NARCAN Place 1 spray into  the nose once.   nitroGLYCERIN  2 % ointment Commonly known as: NITROGLYN Apply 1 inch topically as needed for chest pain.   omeprazole 20 MG capsule Commonly known as: PRILOSEC Take 20 mg by mouth daily.   polyethylene glycol 17 g packet Commonly known as: MIRALAX  / GLYCOLAX  Take 17 g by mouth daily as needed for mild constipation.   potassium chloride  SA 20 MEQ tablet Commonly known as: KLOR-CON  M Take 20 mEq by mouth daily as needed (With Furosemide ).   senna 8.6 MG Tabs tablet Commonly known as: SENOKOT Take 1 tablet by mouth daily as needed for mild constipation.   simethicone  80 MG chewable tablet Commonly known as: MYLICON Chew 80 mg by mouth 4 (four) times daily as needed for flatulence.   sodium chloride  0.65 % nasal spray Commonly known as: OCEAN Place 2 sprays into the nose every 6 (six)  hours as needed for congestion.   Stiolto Respimat 2.5-2.5 MCG/ACT Aers Generic drug: Tiotropium Bromide-Olodaterol Inhale 2 each into the lungs 2 (two) times daily.   triamcinolone cream 0.1 % Commonly known as: KENALOG Apply 1 Application topically 2 (two) times daily.       Allergies[1]  Follow-up Information     Paraschos, Alexander, MD. Go in 1 week(s).   Specialty: Cardiology Contact information: [address] [facility] [place] [address] [phone]         [facility] Follow up.   Specialty: General Practice Contact information: [address] [place] [address] [phone]                  The results of significant diagnostics from this hospitalization (including imaging, microbiology, ancillary and laboratory) are listed below for reference.    Significant Diagnostic Studies: CT CHEST WO CONTRAST Result Date: 12/12/2024 EXAM: CT CHEST WITHOUT CONTRAST 12/12/2024 09:06:11 AM TECHNIQUE: CT of the chest was performed without the administration of intravenous contrast. Multiplanar reformatted  images are provided for review. Automated exposure control, iterative reconstruction, and/or weight based adjustment of the mA/kV was utilized to reduce the radiation dose to as low as reasonably achievable. COMPARISON: Chest CT 03/13/last year. CLINICAL HISTORY: 74 year old male with pneumonia, suspected complication, and new lung base opacity on radiographs today. FINDINGS: MEDIASTINUM: Heart size is normal. No pericardial effusion. Advanced calcified aortic and left coronary artery atherosclerosis. New retained secretions at the carina and in the mainstem bronchi. LYMPH NODES: No mediastinal, hilar or axillary lymphadenopathy. LUNGS AND PLEURA: [place] trachea contour redemonstrated (series 4 image 34). New retained secretions at the carina and in the mainstem bronchi. Emphysema redemonstrated. Consolidation throughout the right middle lobe, specifically the lateral segment. Patchy adjacent right upper and lower lobe opacity. Small or trace layering right pleural effusion. No acute left lung base opacity. New lung herniation now in the left posterolateral intercostal space of ribs 9 and 10 (series 4 image 133). No pneumothorax. BONES: No acute osseous abnormality. UPPER ABDOMEN: Limited images of the upper abdomen demonstrate negative visible non-contrast upper abdominal viscera. IMPRESSION: 1. Right middle lobe consolidated Pneumonia, with early infection in the adjacent lobes. Small volume retained secretions in the central airways. Small or trace layering right pleural effusion. 2. Underlying Emphysema and COPD. New since last year left lower lung herniation in the left posterolateral intercostal space of ribs 9 and 10. Electronically signed by: Helayne Hurst MD 12/12/2024 09:16 AM EST RP Workstation: [id number]   DG Chest Port 1 View Result Date: 12/12/2024 CLINICAL DATA:  Questionable sepsis. EXAM: PORTABLE CHEST 1 VIEW COMPARISON:  01/12/2024 FINDINGS: Interval development of patchy and confluent  airspace disease in the right lower lung consistent with pneumonia. Similar patchy opacity at the left base is likely chronic. Bullous changes with underlying emphysema noted. Cardiopericardial silhouette is at upper limits of normal for size. No acute bony abnormality. Telemetry leads overlie the chest. IMPRESSION: Interval development of patchy and confluent airspace disease in the right lower lung consistent with pneumonia. Electronically Signed   By: Camellia Candle M.D.   On: 12/12/2024 06:31    Microbiology: Recent Results (from the past 240 hours)  Resp panel by RT-PCR (RSV, Flu A&B, Covid) Anterior Nasal Swab     Status: None   Collection Time: 12/12/24  5:40 AM   Specimen: Anterior Nasal Swab  Result Value Ref Range Status   SARS Coronavirus 2 by RT PCR NEGATIVE NEGATIVE Final  Comment: (NOTE) SARS-CoV-2 target nucleic acids are NOT DETECTED.  The SARS-CoV-2 RNA is generally detectable in upper respiratory specimens during the acute phase of infection. The lowest concentration of SARS-CoV-2 viral copies this assay can detect is 138 copies/mL. A negative result does not preclude SARS-Cov-2 infection and should not be used as the sole basis for treatment or other patient management decisions. A negative result may occur with  improper specimen collection/handling, submission of specimen other than nasopharyngeal swab, presence of viral mutation(s) within the areas targeted by this assay, and inadequate number of viral copies(<138 copies/mL). A negative result must be combined with clinical observations, patient history, and epidemiological information. The expected result is Negative.  Fact Sheet for Patients:  bloggercourse.com  Fact Sheet for Healthcare Providers:  seriousbroker.it  This test is no t yet approved or cleared by the [place]  FDA and  has been authorized for detection and/or diagnosis of SARS-CoV-2 by FDA  under an Emergency Use Authorization (EUA). This EUA will remain  in effect (meaning this test can be used) for the duration of the COVID-19 declaration under Section 564(b)(1) of the Act, 21 U.S.C.section 360bbb-3(b)(1), unless the authorization is terminated  or revoked sooner.       Influenza A by PCR NEGATIVE NEGATIVE Final   Influenza B by PCR NEGATIVE NEGATIVE Final    Comment: (NOTE) The Xpert Xpress SARS-CoV-2/FLU/RSV plus assay is intended as an aid in the diagnosis of influenza from Nasopharyngeal swab specimens and should not be used as a sole basis for treatment. Nasal washings and aspirates are unacceptable for Xpert Xpress SARS-CoV-2/FLU/RSV testing.  Fact Sheet for Patients: bloggercourse.com  Fact Sheet for Healthcare Providers: seriousbroker.it  This test is not yet approved or cleared by the [place]  FDA and has been authorized for detection and/or diagnosis of SARS-CoV-2 by FDA under an Emergency Use Authorization (EUA). This EUA will remain in effect (meaning this test can be used) for the duration of the COVID-19 declaration under Section 564(b)(1) of the Act, 21 U.S.C. section 360bbb-3(b)(1), unless the authorization is terminated or revoked.     Resp Syncytial Virus by PCR NEGATIVE NEGATIVE Final    Comment: (NOTE) Fact Sheet for Patients: bloggercourse.com  Fact Sheet for Healthcare Providers: seriousbroker.it  This test is not yet approved or cleared by the [place]  FDA and has been authorized for detection and/or diagnosis of SARS-CoV-2 by FDA under an Emergency Use Authorization (EUA). This EUA will remain in effect (meaning this test can be used) for the duration of the COVID-19 declaration under Section 564(b)(1) of the Act, 21 U.S.C. section 360bbb-3(b)(1), unless the authorization is terminated or revoked.  Performed at [facility], [address] [place], [place] [address]   Blood culture (routine single)     Status: None   Collection Time: 12/12/24  5:56 AM   Specimen: BLOOD  Result Value Ref Range Status   Specimen Description BLOOD BLOOD LEFT ARM  Final   Special Requests   Final    BOTTLES DRAWN AEROBIC AND ANAEROBIC Blood Culture adequate volume   Culture   Final    NO GROWTH 5 DAYS Performed at [facility], [address], [place], [place] [address]    Report Status 12/17/2024 FINAL  Final  Culture, blood (single)     Status: None   Collection Time: 12/12/24  7:10 AM   Specimen: BLOOD  Result Value Ref Range Status   Specimen Description BLOOD BLOOD RIGHT ARM  Final  Special Requests   Final    BOTTLES DRAWN AEROBIC AND ANAEROBIC Blood Culture results may not be optimal due to an inadequate volume of blood received in culture bottles   Culture   Final    NO GROWTH 5 DAYS Performed at [facility], [address] [place], [place] [address]    Report Status 12/17/2024 FINAL  Final  Expectorated Sputum Assessment w Gram Stain, Rflx to Resp Cult     Status: None   Collection Time: 12/12/24  9:59 PM   Specimen: Expectorated Sputum  Result Value Ref Range Status   Specimen Description EXPECTORATED SPUTUM  Final   Special Requests NONE  Final   Sputum evaluation   Final    Sputum specimen not acceptable for testing.  Please recollect.   CALLED TO ALEX ASIDO @ 0110 ON 12/13/2024 BY CAF Performed at [facility], [address] [place], [place] [address]    Report Status 12/13/2024 FINAL  Final  Respiratory (~20 pathogens) panel by PCR     Status: None   Collection Time: 12/17/24  4:10 PM   Specimen: Nasopharyngeal Swab; Respiratory  Result Value Ref Range Status   Adenovirus NOT DETECTED NOT DETECTED Final   Coronavirus 229E NOT DETECTED NOT DETECTED Final    Comment: (NOTE) The Coronavirus on the Respiratory Panel, DOES NOT test for the novel   Coronavirus (2019 nCoV)    Coronavirus HKU1 NOT DETECTED NOT DETECTED Final   Coronavirus NL63 NOT DETECTED NOT DETECTED Final   Coronavirus OC43 NOT DETECTED NOT DETECTED Final   Metapneumovirus NOT DETECTED NOT DETECTED Final   Rhinovirus / Enterovirus NOT DETECTED NOT DETECTED Final   Influenza A NOT DETECTED NOT DETECTED Final   Influenza B NOT DETECTED NOT DETECTED Final   Parainfluenza Virus 1 NOT DETECTED NOT DETECTED Final   Parainfluenza Virus 2 NOT DETECTED NOT DETECTED Final   Parainfluenza Virus 3 NOT DETECTED NOT DETECTED Final   Parainfluenza Virus 4 NOT DETECTED NOT DETECTED Final   Respiratory Syncytial Virus NOT DETECTED NOT DETECTED Final   Bordetella pertussis NOT DETECTED NOT DETECTED Final   Bordetella Parapertussis NOT DETECTED NOT DETECTED Final   Chlamydophila pneumoniae NOT DETECTED NOT DETECTED Final   Mycoplasma pneumoniae NOT DETECTED NOT DETECTED Final    Comment: Performed at [facility], [address] [place], [place] [address]  Expectorated Sputum Assessment w Gram Stain, Rflx to Resp Cult     Status: None   Collection Time: 12/17/24  6:36 PM   Specimen: Expectorated Sputum  Result Value Ref Range Status   Specimen Description EXPECTORATED SPUTUM  Final   Special Requests NONE  Final   Sputum evaluation   Final    THIS SPECIMEN IS ACCEPTABLE FOR SPUTUM CULTURE Performed at [facility], [address] [place], [place] [address]    Report Status 12/17/2024 FINAL  Final  Culture, Respiratory w Gram Stain     Status: None (Preliminary result)   Collection Time: 12/17/24  6:36 PM  Result Value Ref Range Status   Specimen Description   Final    EXPECTORATED SPUTUM Performed at [facility], [address], [place], [place] [address]    Special Requests   Final    NONE Reflexed from [id number] Performed at [facility], [address] [place], [place] [address]    Gram Stain   Final    FEW WBC PRESENT,  PREDOMINANTLY PMN NO ORGANISMS SEEN    Culture  Final    TOO YOUNG TO READ Performed at [facility], [address], [place], [place] [address]    Report Status PENDING  Incomplete     Labs: Basic Metabolic Panel: Recent Labs  Lab 12/14/24 0358 12/16/24 0442 12/17/24 0423 12/17/24 0425 12/18/24 0326  NA 137 139 142 141 139  K 5.1 3.7 4.2 4.2 3.8  CL 100 102 103 102 98  CO2 27 29 32 31 29  GLUCOSE 137* 121* 128* 126* 113*  BUN 29* 33* 29* 29* 26*  CREATININE 1.17 1.04 1.16 1.16 1.07  CALCIUM  8.8* 8.9 9.4 9.5 9.5  MG  --   --  2.3  --   --   PHOS  --   --   --  2.6  --    Liver Function Tests: Recent Labs  Lab 12/12/24 0545 12/17/24 0425  AST 21  --   ALT 14  --   ALKPHOS 117  --   BILITOT 0.9  --   PROT 6.9  --   ALBUMIN 4.3 3.4*   Recent Labs  Lab 12/12/24 0545  LIPASE 26   No results for input(s): AMMONIA in the last 168 hours. CBC: Recent Labs  Lab 12/12/24 0545 12/13/24 0405 12/14/24 0358 12/16/24 0442 12/17/24 0423 12/18/24 0326  WBC 32.1* 34.2* 15.3* 9.7 10.1 11.2*  NEUTROABS 26.3*  --   --   --   --   --   HGB 15.5 13.5 12.9* 12.7* 13.3 14.5  HCT 49.7 43.7 40.6 39.6 42.0 45.8  MCV 94.5 95.6 92.7 92.5 94.2 93.3  PLT 270 195 184 166 183 212   Cardiac Enzymes: No results for input(s): CKTOTAL, CKMB, CKMBINDEX, TROPONINI in the last 168 hours. BNP: BNP (last 3 results) Recent Labs    01/12/24 0933  BNP 107.2*    ProBNP (last 3 results) Recent Labs    12/12/24 0545  PROBNP 1,055.0*    CBG: No results for input(s): GLUCAP in the last 168 hours.     Signed:  Devaughn KATHEE Ban MD.  [facility] 12/18/2024, 1:00 PM     [1]  Allergies Allergen Reactions   Zantac [Ranitidine Hcl] Shortness Of Breath    Eruption, HIVES, SHORTNESS OF BREATH, Eruption, HIVES, SHORTNESS OF BREATH    Molnupiravir Swelling   Codeine Itching    Other reaction(s): PRURITIS   Erythromycin Hives    Other reaction(s): SWELLING  (NON-SPECIFIC), HIVES, SWELLING (NON-SPECIFIC), HIVES   Morphine  Itching   Tiotropium Bromide    Hydrocodone Rash    Other reaction(s): PRURITIS

## 2024-12-18 NOTE — Progress Notes (Signed)
 [facility] Cardiology  CARDIOLOGY CONSULT NOTE  Patient ID: Darius Norris MRN: [id number] DOB/AGE: September 11, 1951 74 y.o.  Admit date: 12/12/2024 Referring Physician Dr. Kandis Reason for Consultation: Afib with RVR, HF  HPI: Patient feels his breathing is better today.  Back in sinus rhythm   Review of systems complete and found to be negative unless listed above     Past Medical History:  Diagnosis Date   A-fib (HCC)    CAD (coronary artery disease)    Colitis    COPD (chronic obstructive pulmonary disease) (HCC)    Diabetes mellitus without complication (HCC)    Emphysema lung (HCC)    GERD (gastroesophageal reflux disease)    Hypertension    MI (myocardial infarction) (HCC) 1999    Past Surgical History:  Procedure Laterality Date   ABDOMINAL HERNIA REPAIR Bilateral    CHOLECYSTECTOMY     COLONOSCOPY WITH PROPOFOL  N/A 02/01/2018   Procedure: COLONOSCOPY WITH PROPOFOL ;  Surgeon: Therisa Bi, MD;  Location: [facility] ENDOSCOPY;  Service: Gastroenterology;  Laterality: N/A;   KNEE ARTHROSCOPY Bilateral    LEFT HEART CATH AND CORONARY ANGIOGRAPHY N/A 01/03/2023   Procedure: LEFT HEART CATH AND CORONARY ANGIOGRAPHY and possible pci and stent;  Surgeon: Ammon Blunt, MD;  Location: [facility] INVASIVE CV LAB;  Service: Cardiovascular;  Laterality: N/A;    Medications Prior to Admission  Medication Sig Dispense Refill Last Dose/Taking   acetaminophen  (TYLENOL ) 325 MG tablet Take 975 mg by mouth 3 (three) times daily.   12/11/2024   alendronate (FOSAMAX) 70 MG tablet Take 70 mg by mouth once a week.   Past Week   atorvastatin  (LIPITOR ) 80 MG tablet Take 0.5 tablets (40 mg total) by mouth at bedtime. Half of an 80   12/11/2024   Cholecalciferol 50 MCG (2000 UT) TABS Take 50 mcg by mouth 2 (two) times daily with a meal.   12/11/2024   dabigatran  (PRADAXA ) 150 MG CAPS capsule Take 150 mg by mouth 2 (two) times daily.   12/11/2024 Evening   diclofenac  Sodium (VOLTAREN ) 1 % GEL Apply 4 g  topically 4 (four) times daily.   Taking   empagliflozin  (JARDIANCE ) 25 MG TABS tablet Take 12.5 mg by mouth daily. Take 0.5 tablet (12.5 mg) by mouth once daily   12/11/2024   ferrous sulfate 325 (65 FE) MG EC tablet Take 325 mg by mouth daily with breakfast.   12/11/2024   fluticasone  (FLONASE ) 50 MCG/ACT nasal spray Place 2 sprays into both nostrils daily as needed.   Unknown   folic acid  (FOLVITE ) 1 MG tablet Take 1 mg by mouth daily. Hold on Sunday when taking Methotrexate    12/11/2024   furosemide  (LASIX ) 40 MG tablet Take 0.5 tablets (20 mg total) by mouth daily. Take 1 tablet (20 mg total) by mouth TWICE daily (total daily dose 40 mg) as needed for up to 3 days for increased leg swelling, shortness of breath, weight gain 5+ lbs over 1-2 days. Seek medical care if these symptoms are not improving with increased dose.   12/11/2024   gabapentin  (NEURONTIN ) 300 MG capsule Take 600 mg by mouth 3 (three) times daily.   12/11/2024   guaifenesin  (HUMIBID E) 400 MG TABS tablet Take 400 mg by mouth every 4 (four) hours as needed.   Unknown   hydrocortisone  2.5 % lotion Apply 1 application  topically 2 (two) times daily.   Taking   isosorbide  mononitrate (IMDUR ) 60 MG 24 hr tablet Take 60 mg by mouth daily.  12/11/2024   lidocaine  (LIDODERM ) 5 % Place 1 patch onto the skin daily as needed. Remove & Discard patch within 12 hours or as directed by MD   Unknown   Melatonin 3 MG CAPS Take 2 capsules by mouth at bedtime.   12/11/2024   metoprolol  (TOPROL -XL) 200 MG 24 hr tablet Take 100 mg by mouth daily.   Taking   naloxone (NARCAN) nasal spray 4 mg/0.1 mL Place 1 spray into the nose once.   Unknown   nitroGLYCERIN  (NITROGLYN) 2 % ointment Apply 1 inch topically as needed for chest pain.   Taking As Needed   omeprazole (PRILOSEC) 20 MG capsule Take 20 mg by mouth daily.   12/11/2024   polyethylene glycol (MIRALAX  / GLYCOLAX ) 17 g packet Take 17 g by mouth daily as needed for mild constipation.   Unknown    potassium chloride  SA (KLOR-CON  M) 20 MEQ tablet Take 20 mEq by mouth daily as needed (With Furosemide ).   Unknown   senna (SENOKOT) 8.6 MG TABS tablet Take 1 tablet by mouth daily as needed for mild constipation.   Unknown   simethicone  (MYLICON) 80 MG chewable tablet Chew 80 mg by mouth 4 (four) times daily as needed for flatulence.   Unknown   sodium chloride  (OCEAN) 0.65 % nasal spray Place 2 sprays into the nose every 6 (six) hours as needed for congestion.   Unknown   Tiotropium Bromide-Olodaterol (STIOLTO RESPIMAT) 2.5-2.5 MCG/ACT AERS Inhale 2 each into the lungs 2 (two) times daily.   12/11/2024   triamcinolone cream (KENALOG) 0.1 % Apply 1 Application topically 2 (two) times daily.   Taking   methotrexate  (RHEUMATREX) 2.5 MG tablet Take 3 tablets (7.5 mg total) by mouth once a week AND 2 tablets (5 mg total) once a week. Caution:Chemotherapy. Protect from light..      oxyCODONE  (OXY IR/ROXICODONE ) 5 MG immediate release tablet Take 1 tablet (5 mg total) by mouth every 4 (four) hours as needed for moderate pain (pain score 4-6) or severe pain (pain score 7-10). (Patient not taking: Reported on 12/12/2024) 10 tablet 0 Not Taking   Social History   Socioeconomic History   Marital status: Married    Spouse name: Not on file   Number of children: Not on file   Years of education: Not on file   Highest education level: Not on file  Occupational History   Not on file  Tobacco Use   Smoking status: Former    Current packs/day: 0.00    Average packs/day: 1 pack/day for 30.0 years (30.0 ttl pk-yrs)    Types: Cigarettes    Start date: 12/19/1971    Quit date: 12/18/2001    Years since quitting: 23.0   Smokeless tobacco: Never  Vaping Use   Vaping status: Never Used  Substance and Sexual Activity   Alcohol use: No   Drug use: No   Sexual activity: Never  Other Topics Concern   Not on file  Social History Narrative   Not on file   Social Drivers of Health   Tobacco Use: Medium Risk  (12/12/2024)   Patient History    Smoking Tobacco Use: Former    Smokeless Tobacco Use: Never    Passive Exposure: Not on Actuary Strain: Not on file  Food Insecurity: No Food Insecurity (12/13/2024)   Epic    Worried About Radiation Protection Practitioner of Food in the Last Year: Never true    Ran Out of Food in the Last  Year: Never true  Transportation Needs: No Transportation Needs (12/13/2024)   Epic    Lack of Transportation (Medical): No    Lack of Transportation (Non-Medical): No  Physical Activity: Not on file  Stress: Not on file  Social Connections: Moderately Isolated (12/13/2024)   Social Connection and Isolation Panel    Frequency of Communication with Friends and Family: More than three times a week    Frequency of Social Gatherings with Friends and Family: Once a week    Attends Religious Services: Never    Database Administrator or Organizations: No    Attends Banker Meetings: Never    Marital Status: Married  Catering Manager Violence: Not At Risk (12/13/2024)   Epic    Fear of Current or Ex-Partner: No    Emotionally Abused: No    Physically Abused: No    Sexually Abused: No  Depression (PHQ2-9): Low Risk (10/10/2023)   Depression (PHQ2-9)    PHQ-2 Score: 4  Alcohol Screen: Not on file  Housing: Low Risk (12/13/2024)   Epic    Unable to Pay for Housing in the Last Year: No    Number of Times Moved in the Last Year: 0    Homeless in the Last Year: No  Utilities: Not At Risk (12/13/2024)   Epic    Threatened with loss of utilities: No  Health Literacy: Not on file    Family History  Problem Relation Age of Onset   CAD Father    Stroke Father    CAD Sister    Stroke Brother       Review of systems complete and found to be negative unless listed above      PHYSICAL EXAM  Regular rhythm Trace pedal edema Decreased bilateral breath sounds Alert and oriented x 3  Labs:   Lab Results  Component Value Date   WBC 11.2 (H)  12/18/2024   HGB 14.5 12/18/2024   HCT 45.8 12/18/2024   MCV 93.3 12/18/2024   PLT 212 12/18/2024    Recent Labs  Lab 12/12/24 0545 12/13/24 0405 12/18/24 0326  NA 137   < > 139  K 4.4   < > 3.8  CL 100   < > 98  CO2 25   < > 29  BUN 18   < > 26*  CREATININE 1.20   < > 1.07  CALCIUM  8.8*   < > 9.5  PROT 6.9  --   --   BILITOT 0.9  --   --   ALKPHOS 117  --   --   ALT 14  --   --   AST 21  --   --   GLUCOSE 153*   < > 113*   < > = values in this interval not displayed.   Lab Results  Component Value Date   CKTOTAL 74 02/05/2013   CKMB 0.9 02/05/2013   TROPONINI <0.03 01/05/2019   TROPONINI <0.03 01/05/2019    Lab Results  Component Value Date   CHOL 98 01/03/2023   CHOL 106 12/06/2021   CHOL 116 09/04/2016   Lab Results  Component Value Date   HDL 29 (L) 01/03/2023   HDL 33 (L) 12/06/2021   HDL 42 09/04/2016   Lab Results  Component Value Date   LDLCALC 53 01/03/2023   LDLCALC 51 12/06/2021   LDLCALC 51 09/04/2016   Lab Results  Component Value Date   TRIG 81 01/03/2023   TRIG 108 12/06/2021  TRIG 116 09/04/2016   Lab Results  Component Value Date   CHOLHDL 3.4 01/03/2023   CHOLHDL 3.2 12/06/2021   CHOLHDL 2.8 09/04/2016   No results found for: LDLDIRECT    Radiology: CT CHEST WO CONTRAST Result Date: 12/12/2024 EXAM: CT CHEST WITHOUT CONTRAST 12/12/2024 09:06:11 AM TECHNIQUE: CT of the chest was performed without the administration of intravenous contrast. Multiplanar reformatted images are provided for review. Automated exposure control, iterative reconstruction, and/or weight based adjustment of the mA/kV was utilized to reduce the radiation dose to as low as reasonably achievable. COMPARISON: Chest CT 03/13/last year. CLINICAL HISTORY: 74 year old male with pneumonia, suspected complication, and new lung base opacity on radiographs today. FINDINGS: MEDIASTINUM: Heart size is normal. No pericardial effusion. Advanced calcified aortic and left  coronary artery atherosclerosis. New retained secretions at the carina and in the mainstem bronchi. LYMPH NODES: No mediastinal, hilar or axillary lymphadenopathy. LUNGS AND PLEURA: [place] trachea contour redemonstrated (series 4 image 34). New retained secretions at the carina and in the mainstem bronchi. Emphysema redemonstrated. Consolidation throughout the right middle lobe, specifically the lateral segment. Patchy adjacent right upper and lower lobe opacity. Small or trace layering right pleural effusion. No acute left lung base opacity. New lung herniation now in the left posterolateral intercostal space of ribs 9 and 10 (series 4 image 133). No pneumothorax. BONES: No acute osseous abnormality. UPPER ABDOMEN: Limited images of the upper abdomen demonstrate negative visible non-contrast upper abdominal viscera. IMPRESSION: 1. Right middle lobe consolidated Pneumonia, with early infection in the adjacent lobes. Small volume retained secretions in the central airways. Small or trace layering right pleural effusion. 2. Underlying Emphysema and COPD. New since last year left lower lung herniation in the left posterolateral intercostal space of ribs 9 and 10. Electronically signed by: Helayne Hurst MD 12/12/2024 09:16 AM EST RP Workstation: [id number]   DG Chest Port 1 View Result Date: 12/12/2024 CLINICAL DATA:  Questionable sepsis. EXAM: PORTABLE CHEST 1 VIEW COMPARISON:  01/12/2024 FINDINGS: Interval development of patchy and confluent airspace disease in the right lower lung consistent with pneumonia. Similar patchy opacity at the left base is likely chronic. Bullous changes with underlying emphysema noted. Cardiopericardial silhouette is at upper limits of normal for size. No acute bony abnormality. Telemetry leads overlie the chest. IMPRESSION: Interval development of patchy and confluent airspace disease in the right lower lung consistent with pneumonia. Electronically Signed   By: Camellia Candle M.D.    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 Will discontinue amiodarone  drip later this afternoon. Continue metoprolol  Continue Pradaxa  for anticoagulation Continue statin COPD management per primary team.  Signed: Keller JAYSON Paterson MD 12/18/2024, 11:47 AM

## 2024-12-18 NOTE — Plan of Care (Signed)

## 2024-12-19 LAB — CULTURE, RESPIRATORY W GRAM STAIN

## 2024-12-20 LAB — LEGIONELLA PNEUMOPHILA SEROGP 1 UR AG: L. pneumophila Serogp 1 Ur Ag: NEGATIVE

## 2024-12-30 NOTE — Progress Notes (Signed)
 Sepsis as evidenced by tachycardia, leukocytosis increased lactic acid level, source infection is RML CAP, bacterial.
